# Patient Record
Sex: Male | Born: 1960 | Race: Black or African American | Hispanic: No | Marital: Single | Smoking: Current every day smoker
Health system: Southern US, Community
[De-identification: ages and names within clinical notes are randomized; demographics above are authoritative.]

## PROBLEM LIST (undated history)

## (undated) DIAGNOSIS — F1010 Alcohol abuse, uncomplicated: Secondary | ICD-10-CM

## (undated) DIAGNOSIS — I4891 Unspecified atrial fibrillation: Secondary | ICD-10-CM

## (undated) DIAGNOSIS — J449 Chronic obstructive pulmonary disease, unspecified: Secondary | ICD-10-CM

## (undated) DIAGNOSIS — I10 Essential (primary) hypertension: Secondary | ICD-10-CM

## (undated) DIAGNOSIS — Z923 Personal history of irradiation: Secondary | ICD-10-CM

## (undated) DIAGNOSIS — C20 Malignant neoplasm of rectum: Secondary | ICD-10-CM

## (undated) DIAGNOSIS — C801 Malignant (primary) neoplasm, unspecified: Secondary | ICD-10-CM

## (undated) DIAGNOSIS — C7800 Secondary malignant neoplasm of unspecified lung: Secondary | ICD-10-CM

## (undated) HISTORY — PX: MULTIPLE TOOTH EXTRACTIONS: SHX2053

---

## 1990-08-17 ENCOUNTER — Ambulatory Visit (HOSPITAL_COMMUNITY): Payer: Self-pay

## 2009-04-07 ENCOUNTER — Emergency Department (HOSPITAL_COMMUNITY): Admission: EM | Admit: 2009-04-07 | Discharge: 2009-04-07 | Payer: Self-pay | Admitting: Internal Medicine

## 2009-09-11 ENCOUNTER — Emergency Department (HOSPITAL_COMMUNITY)
Admission: EM | Admit: 2009-09-11 | Discharge: 2009-09-11 | Payer: Self-pay | Source: Home / Self Care | Admitting: Emergency Medicine

## 2009-09-17 ENCOUNTER — Ambulatory Visit: Payer: Self-pay | Admitting: Gastroenterology

## 2009-09-17 ENCOUNTER — Encounter: Payer: Self-pay | Admitting: Gastroenterology

## 2009-09-17 DIAGNOSIS — R197 Diarrhea, unspecified: Secondary | ICD-10-CM | POA: Insufficient documentation

## 2009-09-17 DIAGNOSIS — R198 Other specified symptoms and signs involving the digestive system and abdomen: Secondary | ICD-10-CM | POA: Insufficient documentation

## 2009-09-17 DIAGNOSIS — R933 Abnormal findings on diagnostic imaging of other parts of digestive tract: Secondary | ICD-10-CM | POA: Insufficient documentation

## 2009-09-17 DIAGNOSIS — R1032 Left lower quadrant pain: Secondary | ICD-10-CM | POA: Insufficient documentation

## 2009-09-17 DIAGNOSIS — K625 Hemorrhage of anus and rectum: Secondary | ICD-10-CM | POA: Insufficient documentation

## 2009-09-18 ENCOUNTER — Ambulatory Visit: Payer: Self-pay | Admitting: Oncology

## 2009-09-18 ENCOUNTER — Telehealth: Payer: Self-pay | Admitting: Gastroenterology

## 2009-09-18 ENCOUNTER — Ambulatory Visit: Payer: Self-pay | Admitting: Gastroenterology

## 2009-09-19 ENCOUNTER — Telehealth (INDEPENDENT_AMBULATORY_CARE_PROVIDER_SITE_OTHER): Payer: Self-pay | Admitting: *Deleted

## 2009-09-19 ENCOUNTER — Encounter (INDEPENDENT_AMBULATORY_CARE_PROVIDER_SITE_OTHER): Payer: Self-pay | Admitting: *Deleted

## 2009-09-19 DIAGNOSIS — C20 Malignant neoplasm of rectum: Secondary | ICD-10-CM

## 2009-09-19 HISTORY — DX: Malignant neoplasm of rectum: C20

## 2009-09-25 ENCOUNTER — Ambulatory Visit: Payer: Self-pay | Admitting: Gastroenterology

## 2009-09-25 ENCOUNTER — Ambulatory Visit (HOSPITAL_COMMUNITY)
Admission: RE | Admit: 2009-09-25 | Discharge: 2009-09-25 | Payer: Self-pay | Source: Home / Self Care | Admitting: Gastroenterology

## 2009-09-25 ENCOUNTER — Encounter (INDEPENDENT_AMBULATORY_CARE_PROVIDER_SITE_OTHER): Payer: Self-pay | Admitting: *Deleted

## 2009-09-26 ENCOUNTER — Ambulatory Visit: Admission: RE | Admit: 2009-09-26 | Discharge: 2009-11-19 | Payer: Self-pay | Admitting: Radiation Oncology

## 2009-10-02 ENCOUNTER — Encounter: Payer: Self-pay | Admitting: Gastroenterology

## 2009-10-03 ENCOUNTER — Ambulatory Visit (HOSPITAL_COMMUNITY)
Admission: RE | Admit: 2009-10-03 | Discharge: 2009-10-03 | Payer: Self-pay | Source: Home / Self Care | Admitting: Radiation Oncology

## 2009-10-09 LAB — CBC WITH DIFFERENTIAL/PLATELET
Eosinophils Absolute: 0 10*3/uL (ref 0.0–0.5)
MCH: 34.5 pg — ABNORMAL HIGH (ref 27.2–33.4)
MCHC: 34.5 g/dL (ref 32.0–36.0)
MCV: 100.1 fL — ABNORMAL HIGH (ref 79.3–98.0)
MONO#: 0.5 10*3/uL (ref 0.1–0.9)
MONO%: 6.1 % (ref 0.0–14.0)
NEUT%: 81.2 % — ABNORMAL HIGH (ref 39.0–75.0)
lymph#: 1 10*3/uL (ref 0.9–3.3)

## 2009-10-09 LAB — COMPREHENSIVE METABOLIC PANEL
ALT: 18 U/L (ref 0–53)
AST: 22 U/L (ref 0–37)
Albumin: 4.3 g/dL (ref 3.5–5.2)
Alkaline Phosphatase: 68 U/L (ref 39–117)
Calcium: 9.6 mg/dL (ref 8.4–10.5)
Chloride: 103 mEq/L (ref 96–112)
Creatinine, Ser: 0.99 mg/dL (ref 0.40–1.50)
Sodium: 140 mEq/L (ref 135–145)

## 2009-10-09 LAB — CEA: CEA: 34.7 ng/mL — ABNORMAL HIGH (ref 0.0–5.0)

## 2009-10-21 ENCOUNTER — Ambulatory Visit: Payer: Self-pay | Admitting: Oncology

## 2009-10-21 ENCOUNTER — Encounter: Payer: Self-pay | Admitting: Gastroenterology

## 2009-10-21 LAB — CBC WITH DIFFERENTIAL/PLATELET
Eosinophils Absolute: 0.1 10*3/uL (ref 0.0–0.5)
HCT: 42 % (ref 38.4–49.9)
MCH: 33 pg (ref 27.2–33.4)
MCHC: 33.1 g/dL (ref 32.0–36.0)
MCV: 99.8 fL — ABNORMAL HIGH (ref 79.3–98.0)
MONO#: 0.5 10*3/uL (ref 0.1–0.9)
MONO%: 10.5 % (ref 0.0–14.0)
NEUT#: 3.5 10*3/uL (ref 1.5–6.5)
RBC: 4.21 10*6/uL (ref 4.20–5.82)
RDW: 12.2 % (ref 11.0–14.6)
WBC: 4.8 10*3/uL (ref 4.0–10.3)
lymph#: 0.6 10*3/uL — ABNORMAL LOW (ref 0.9–3.3)

## 2009-10-28 LAB — COMPREHENSIVE METABOLIC PANEL WITH GFR
ALT: 16 U/L (ref 0–53)
AST: 16 U/L (ref 0–37)
Albumin: 4 g/dL (ref 3.5–5.2)
Alkaline Phosphatase: 57 U/L (ref 39–117)
BUN: 15 mg/dL (ref 6–23)
CO2: 26 meq/L (ref 19–32)
Calcium: 8.9 mg/dL (ref 8.4–10.5)
Chloride: 107 meq/L (ref 96–112)
Creatinine, Ser: 1.17 mg/dL (ref 0.40–1.50)
Glucose, Bld: 86 mg/dL (ref 70–99)
Potassium: 3.8 meq/L (ref 3.5–5.3)
Sodium: 141 meq/L (ref 135–145)
Total Bilirubin: 0.6 mg/dL (ref 0.3–1.2)
Total Protein: 6.7 g/dL (ref 6.0–8.3)

## 2009-10-28 LAB — CBC WITH DIFFERENTIAL/PLATELET
BASO%: 0.3 % (ref 0.0–2.0)
Basophils Absolute: 0 10e3/uL (ref 0.0–0.1)
EOS%: 1.1 % (ref 0.0–7.0)
Eosinophils Absolute: 0 10e3/uL (ref 0.0–0.5)
HCT: 37.6 % — ABNORMAL LOW (ref 38.4–49.9)
HGB: 13.2 g/dL (ref 13.0–17.1)
LYMPH%: 10.8 % — ABNORMAL LOW (ref 14.0–49.0)
MCH: 34.8 pg — ABNORMAL HIGH (ref 27.2–33.4)
MCHC: 35.1 g/dL (ref 32.0–36.0)
MCV: 99.1 fL — ABNORMAL HIGH (ref 79.3–98.0)
MONO#: 0.5 10e3/uL (ref 0.1–0.9)
MONO%: 12.9 % (ref 0.0–14.0)
NEUT#: 2.9 10e3/uL (ref 1.5–6.5)
NEUT%: 74.9 % (ref 39.0–75.0)
Platelets: 162 10e3/uL (ref 140–400)
RBC: 3.79 10e6/uL — ABNORMAL LOW (ref 4.20–5.82)
RDW: 12.5 % (ref 11.0–14.6)
WBC: 3.9 10e3/uL — ABNORMAL LOW (ref 4.0–10.3)
lymph#: 0.4 10e3/uL — ABNORMAL LOW (ref 0.9–3.3)

## 2009-11-06 ENCOUNTER — Emergency Department (HOSPITAL_COMMUNITY): Admission: EM | Admit: 2009-11-06 | Discharge: 2009-11-06 | Payer: Self-pay | Admitting: Emergency Medicine

## 2009-11-07 ENCOUNTER — Ambulatory Visit: Payer: Self-pay | Admitting: Dentistry

## 2009-11-07 ENCOUNTER — Encounter: Admission: AD | Admit: 2009-11-07 | Discharge: 2009-11-07 | Payer: Self-pay | Admitting: Dentistry

## 2009-11-10 LAB — CBC WITH DIFFERENTIAL/PLATELET
BASO%: 0.2 % (ref 0.0–2.0)
Basophils Absolute: 0 10*3/uL (ref 0.0–0.1)
Eosinophils Absolute: 0.1 10*3/uL (ref 0.0–0.5)
HCT: 37.3 % — ABNORMAL LOW (ref 38.4–49.9)
LYMPH%: 7.1 % — ABNORMAL LOW (ref 14.0–49.0)
MCV: 102.7 fL — ABNORMAL HIGH (ref 79.3–98.0)
MONO#: 0.6 10*3/uL (ref 0.1–0.9)
Platelets: 201 10*3/uL (ref 140–400)
RDW: 15.8 % — ABNORMAL HIGH (ref 11.0–14.6)

## 2009-11-11 ENCOUNTER — Encounter: Payer: Self-pay | Admitting: Gastroenterology

## 2009-11-13 ENCOUNTER — Ambulatory Visit (HOSPITAL_COMMUNITY): Admission: RE | Admit: 2009-11-13 | Discharge: 2009-11-13 | Payer: Self-pay | Admitting: Dentistry

## 2009-11-26 ENCOUNTER — Ambulatory Visit: Payer: Self-pay | Admitting: Oncology

## 2009-12-22 ENCOUNTER — Encounter: Payer: Self-pay | Admitting: Gastroenterology

## 2010-01-14 ENCOUNTER — Encounter (INDEPENDENT_AMBULATORY_CARE_PROVIDER_SITE_OTHER): Payer: Self-pay | Admitting: General Surgery

## 2010-01-14 ENCOUNTER — Other Ambulatory Visit (INDEPENDENT_AMBULATORY_CARE_PROVIDER_SITE_OTHER): Payer: Self-pay | Admitting: General Surgery

## 2010-01-14 ENCOUNTER — Inpatient Hospital Stay (HOSPITAL_COMMUNITY)
Admission: RE | Admit: 2010-01-14 | Discharge: 2010-01-21 | Payer: Self-pay | Source: Home / Self Care | Attending: General Surgery | Admitting: General Surgery

## 2010-01-15 HISTORY — PX: COLON SURGERY: SHX602

## 2010-01-26 ENCOUNTER — Inpatient Hospital Stay (HOSPITAL_COMMUNITY)
Admission: AD | Admit: 2010-01-26 | Discharge: 2010-02-04 | Payer: Self-pay | Source: Home / Self Care | Attending: General Surgery | Admitting: General Surgery

## 2010-01-28 ENCOUNTER — Encounter (INDEPENDENT_AMBULATORY_CARE_PROVIDER_SITE_OTHER): Payer: Self-pay | Admitting: General Surgery

## 2010-02-03 ENCOUNTER — Ambulatory Visit: Payer: Self-pay | Admitting: Oncology

## 2010-02-06 ENCOUNTER — Encounter: Payer: Self-pay | Admitting: Gastroenterology

## 2010-02-15 HISTORY — PX: COLON SURGERY: SHX602

## 2010-02-20 ENCOUNTER — Ambulatory Visit: Payer: Self-pay | Admitting: Oncology

## 2010-02-20 ENCOUNTER — Encounter: Payer: Self-pay | Admitting: Gastroenterology

## 2010-03-11 ENCOUNTER — Encounter: Payer: Self-pay | Admitting: Gastroenterology

## 2010-03-11 LAB — CBC WITH DIFFERENTIAL/PLATELET
BASO%: 0.3 % (ref 0.0–2.0)
Basophils Absolute: 0 10*3/uL (ref 0.0–0.1)
Eosinophils Absolute: 0.1 10*3/uL (ref 0.0–0.5)
HCT: 37.3 % — ABNORMAL LOW (ref 38.4–49.9)
HGB: 11.8 g/dL — ABNORMAL LOW (ref 13.0–17.1)
LYMPH%: 17.5 % (ref 14.0–49.0)
MCV: 94.7 fL (ref 79.3–98.0)
NEUT%: 70.7 % (ref 39.0–75.0)
RBC: 3.94 10*6/uL — ABNORMAL LOW (ref 4.20–5.82)
RDW: 17.2 % — ABNORMAL HIGH (ref 11.0–14.6)
lymph#: 1.1 10*3/uL (ref 0.9–3.3)
nRBC: 0 % (ref 0–0)

## 2010-03-11 LAB — COMPREHENSIVE METABOLIC PANEL
AST: 19 U/L (ref 0–37)
Albumin: 3.1 g/dL — ABNORMAL LOW (ref 3.5–5.2)
Alkaline Phosphatase: 81 U/L (ref 39–117)
Calcium: 9 mg/dL (ref 8.4–10.5)
Creatinine, Ser: 0.72 mg/dL (ref 0.40–1.50)
Potassium: 3.6 mEq/L (ref 3.5–5.3)
Sodium: 141 mEq/L (ref 135–145)
Total Bilirubin: 0.5 mg/dL (ref 0.3–1.2)

## 2010-03-17 NOTE — Letter (Signed)
Summary: Appt Reminder 2  [facility]  [address] [place], [place] [address]   Phone: [phone]  Fax: [phone]        September 25, 2009 MRN: [id number]    Jeffery [name] [address] [place], [place]  [address]    Dear [name],   You have an appointment with Dr.Sherrill on 10-02-09 at 11am. He is at the [facility] at [facility]. For any questions about this appointment please call me at [phone] or call Dr.Sherrill's office at [phone].      Sincerely,    Laureen Ochs LPN  Appended Document: Appt Reminder 2 Letter mailed to patient.

## 2010-03-17 NOTE — Progress Notes (Signed)
Summary: Onocology/Surgical Appt's  ---- Converted from flag ---- ---- 09/18/2009 3:54 PM, Louis Meckel MD wrote: He needs an appointment with Dr. [name] and Myrle Sheng.  Send report to each, ------------------------------  Phone Note Outgoing Call   Call placed by: Laureen Ochs LPN,  September 18, 2009 4:09 PM Summary of Call: I have faxed pt. records to the office of Dr.Sherrill and Dr.Byerly. I will contact pt. when I get his appt's scheduled. Initial call taken by: Laureen Ochs LPN,  September 18, 2009 4:10 PM  Follow-up for Phone Call        1)Per Renee at the [facility]: Pt. records are w/Dr.Sherrill for review, she will call with an appt. as soon as she has it scheduled.   2) Per Elane Fritz at [facility], pt's appt. w/Dr.Byerly is 10-09-09 at 2:10. Pt. has no insurance so he will need to bring $273 dollars w/him.  But, if his path. comes back as malignant, callback to get appt. moved up and discuss waiving fee so pt. can be seen. I will wait for pathology report and re-address on 09-22-09. Follow-up by: Laureen Ochs LPN,  September 19, 2009 2:59 PM  Additional Follow-up for Phone Call Additional follow up Details #1::        1)Path. report faxed to Renee at the [facility] and I left her a message to please call me w/pt's appt with Dr.Sherrill.   2) I have faxed the path. report to [facility]. Misty Stanley has rescheduled appt. to 09-23-09 at 11:40am. They will work w/pt. on his payments.  Message left w/pt's mother to have him call me ASAP. Additional Follow-up by: Laureen Ochs LPN,  September 22, 2009 4:01 PM    Additional Follow-up for Phone Call Additional follow up Details #2::    Mr.Tinnel has been given appt. information for tomorrow and he is aware I will call him when I get his appt. info. for Dr.Sherrill. Pt. instructed to call back as needed. Laureen Ochs LPN  September 22, 2009 4:33 PM   Per Luster Landsberg at the cancer center, pt. has an appt. w/Dr.Sherrill on 09-24-09 at 10am. Message left for patient  to callback. Laureen Ochs LPN  September 23, 2009 9:24 AM   I spoke w/pt's mother late yesterday afternoon, I asked her to please have pt. call right away. Now I am leaving a message  with pt's appt. information, I have asked for the pt. to please call and confirm. Follow-up by: Laureen Ochs LPN,  September 24, 2009 8:07 AM  Additional Follow-up for Phone Call Additional follow up Details #3:: Details for Additional Follow-up Action Taken: Pt. didn't go to appt. w/Dr.Sherrill. I have asked Renee to please reschedule the appt. I have left another message for pt. to please call me. Laureen Ochs LPN  September 24, 2009 12:04 PM  Message left for patient to callback. Laureen Ochs LPN  September 24, 2009 3:04 PM   Pt. is scheduled for an EUS this morning at [facility]. dept., I have advised Renee w/Dr.Sherrill, she will take his new appt. information and give it to the pt./designated driver. She will call and let me know the pt. has recieved his appt. info. Laureen Ochs LPN  September 25, 2009 9:04 AM  Pt's driver was given appt. information for pt. to see Dr.Sherrill on 10-02-09 at 11am. I will also mail him a reminder today. Additional Follow-up by: Laureen Ochs LPN,  September 25, 2009 10:32 AM

## 2010-03-17 NOTE — Letter (Signed)
Summary: [facility] Instructions  [facility]  [address] [place], [place] [address]   Phone: [phone]  Fax: [phone]       Jeffery [name]    [birth year]    MRN: [id number]        Procedure Day /Date:09-18-09     Arrival Time: 2:30 PM      Procedure Time: 3:30 PM     Location of Procedure:                    X     [facility] (4th Floor) PREPARATION FOR COLONOSCOPY WITH MOVIPREP    THE DAY BEFORE YOUR PROCEDURE         DATE: 09-17-09  DAY: Wednesday  1.  Drink clear liquids the entire day-NO SOLID FOOD  2.  Do not drink anything colored red or purple.  Avoid juices with pulp.  No orange juice.  3.  Drink at least 64 oz. (8 glasses) of fluid/clear liquids during the day to prevent dehydration and help the prep work efficiently.  CLEAR LIQUIDS INCLUDE: Water Jello Ice Popsicles Tea (sugar ok, no milk/cream) Powdered fruit flavored drinks Coffee (sugar ok, no milk/cream) Gatorade Juice: apple, white grape, white cranberry  Lemonade Clear bullion, consomm, broth Carbonated beverages (any kind) Strained chicken noodle soup Hard Candy                             4.  In the morning, mix first dose of MoviPrep solution:    Empty 1 Pouch A and 1 Pouch B into the disposable container    Add lukewarm drinking water to the top line of the container. Mix to dissolve    Refrigerate (mixed solution should be used within 24 hrs)  5.  Begin drinking the prep at 5:00 p.m. The MoviPrep container is divided by 4 marks.   Every 15 minutes drink the solution down to the next mark (approximately 8 oz) until the full liter is complete.   6.  Follow completed prep with 16 oz of clear liquid of your choice (Nothing red or purple).  Continue to drink clear liquids until bedtime.  7.  Before going to bed, mix second dose of MoviPrep solution:    Empty 1 Pouch A and 1 Pouch B into the disposable container    Add lukewarm drinking water to the top line of  the container. Mix to dissolve    Refrigerate  THE DAY OF YOUR PROCEDURE      DATE :09-18-09 DGL:OVFIEPPI  Beginning at 10:30 AM  (5 hours before procedure):         1. Every 15 minutes, drink the solution down to the next mark (approx 8 oz) until the full liter is complete.  2. Follow completed prep with 16 oz. of clear liquid of your choice.    3. You may drink clear liquids until 1:30 PM  (2 HOURS BEFORE PROCEDURE).   MEDICATION INSTRUCTIONS  Unless otherwise instructed, you should take regular prescription medications with a small sip of water   as early as possible the morning of your procedure.         OTHER INSTRUCTIONS  You will need a responsible adult at least 51 years of age to accompany you and drive you home.   This person must remain in the waiting room during your procedure.  Wear loose fitting clothing that is easily removed.  Leave  jewelry and other valuables at home.  However, you may wish to bring a book to read or  an iPod/MP3 player to listen to music as you wait for your procedure to start.  Remove all body piercing jewelry and leave at home.  Total time from sign-in until discharge is approximately 2-3 hours.  You should go home directly after your procedure and rest.  You can resume normal activities the  day after your procedure.  The day of your procedure you should not:   Drive   Make legal decisions   Operate machinery   Drink alcohol   Return to work  You will receive specific instructions about eating, activities and medications before you leave.    The above instructions have been reviewed and explained to me by   _______________________    I fully understand and can verbalize these instructions _____________________________ Date _________

## 2010-03-17 NOTE — Procedures (Signed)
Summary: Colonoscopy  Patient: Jeffery [name] Note: All result statuses are Final unless otherwise noted.  Tests: (1) Colonoscopy (COL)   COL Colonoscopy           DONE      [facility]     [address]     [place], [place]  [address]           COLONOSCOPY PROCEDURE REPORT           PATIENT:  Jeffery [name], Jeffery [name]  MR#:  [id number]     BIRTHDATE:  07/29/1960, 49 yrs. old  GENDER:  male           ENDOSCOPIST:  Barbette Hair. Arlyce Dice, MD     Referred by:           PROCEDURE DATE:  09/18/2009     PROCEDURE:  Colonoscopy with biopsy     ASA CLASS:  Class I     INDICATIONS:           MEDICATIONS:   Fentanyl 75 mcg IV, Versed 7 mg IV           DESCRIPTION OF PROCEDURE:   After the risks benefits and     alternatives of the procedure were thoroughly explained, informed     consent was obtained.  Digital rectal exam was performed and     revealed rectal mass.  Mass palpated within 4cm of anal verge The     [id number] endoscope was introduced through the anus and     advanced to the cecum, which was identified by both the appendix     and ileocecal valve, without limitations.  The quality of the prep     was good, using MoviPrep.  The instrument was then slowly     withdrawn as the colon was fully examined.     <<PROCEDUREIMAGES>>           FINDINGS:  A mass was found in the rectum. Circumferential,     bleeding exophytic mass with greater than 2cm deep ulcer beginning     at 3-4cm from anal verge and extending approximately 4cm. Multiple     biopsies were taken (see image8 and image9).  This was otherwise a     normal examination of the colon (see image1, image2, image3,     image4, image5, and image7).   Retroflexed views in the rectum     revealed Unable to retroflex.    The time to cecum =  4.25     minutes. The scope was then withdrawn (time =  7.0  min) from the     patient and the procedure completed.           COMPLICATIONS:  None           ENDOSCOPIC IMPRESSION:     1)  Malignant mass in the rectum     2) Otherwise normal examination           RECOMMENDATIONS:     1) EUS     2) Surgery     3) pt will be presented to tumor conference for consideration of     preoperative chemo/radiation therapy           REPEAT EXAM:  In 1 year(s) for Colonoscopy.           ______________________________     Barbette Hair. Arlyce Dice, MD           cc: Dr Almond Lint  n.     eSIGNED:   Barbette Hair. Kaplan at 09/18/2009 03:48 PM           Amada Kingfisher, [id number]  Note: An exclamation mark (!) indicates a result that was not dispersed into the flowsheet. Document Creation Date: 09/18/2009 3:50 PM _______________________________________________________________________  (1) Order result status: Final Collection or observation date-time: 09/18/2009 15:38 Requested date-time:  Receipt date-time:  Reported date-time:  Referring Physician:   Ordering Physician: Melvia Heaps [phone]) Specimen Source:  Source: Launa Grill Order Number: [id number] Lab site:   Appended Document: Colonoscopy Reminder Colonoscopy for 1 year 09/2010

## 2010-03-17 NOTE — Letter (Signed)
Summary: [facility]  [facility]   Imported By: Sherian Rein 01/05/2010 14:32:31  _____________________________________________________________________  External Attachment:    Type:   Image     Comment:   External Document

## 2010-03-17 NOTE — Letter (Signed)
Summary: [facility]  [address] [place], [place] [address]   Phone: [phone]  Fax: [phone]       [name]    07-08-1960    MRN: [id number]        Procedure Day /Date:09/25/09 Jeffery [name]     Arrival Time:930 am      Procedure Time:1030 am     Location of Procedure:                     X  [facility] ( Outpatient Registration)      PREPARATION FOR FLEXIBLE SIGMOIDOSCOPY WITH MAGNESIUM CITRATE  Prior to the day before your procedure, purchase one 8 oz. bottle of Magnesium Citrate and one Fleet Enema from the laxative section of your drugstore.  _________________________________________________________________________________________________  THE DAY BEFORE YOUR PROCEDURE             DATE: 09/24/09 DAY: WED  1.   Have a clear liquid dinner the night before your procedure.  2.   Do not drink anything colored red or purple.  Avoid juices with pulp.  No orange juice.              CLEAR LIQUIDS INCLUDE: Water Jello Ice Popsicles Tea (sugar ok, no milk/cream) Powdered fruit flavored drinks Coffee (sugar ok, no milk/cream) Gatorade Juice: apple, white grape, white cranberry  Lemonade Clear bullion, consomm, broth Carbonated beverages (any kind) Strained chicken noodle soup Hard Candy   3.   At 7:00 pm the night before your procedure, drink one bottle of Magnesium Citrate over ice.  4.   Drink at least 3 more glasses of clear liquids before bedtime (preferably juices).  5.   Results are expected usually within 1 to 6 hours after taking the Magnesium Citrate.  ___________________________________________________________________________________________________  THE DAY OF YOUR PROCEDURE            DATE: 09/25/09 [id number]  1.   Use Fleet Enema one hour prior to coming for procedure.  2.   You may drink clear liquids until 630 am (4 hours before exam)       MEDICATION INSTRUCTIONS  Unless otherwise instructed,  you should take regular prescription medications with a small sip of water as early as possible the morning of your procedure.        OTHER INSTRUCTIONS  You will need a responsible adult at least 51 years of age to accompany you and drive you home.   This person must remain in the waiting room during your procedure.  Wear loose fitting clothing that is easily removed.  Leave jewelry and other valuables at home.  However, you may wish to bring a book to read or an iPod/MP3 player to listen to music as you wait for your procedure to start.  Remove all body piercing jewelry and leave at home.  Total time from sign-in until discharge is approximately 2-3 hours.  You should go home directly after your procedure and rest.  You can resume normal activities the day after your procedure.  The day of your procedure you should not:   Drive   Make legal decisions   Operate machinery   Drink alcohol   Return to work  You will receive specific instructions about eating, activities and medications before you leave.   The above instructions have been reviewed and explained to me by   Chales Abrahams CMA (AAMA)  September 19, 2009 3:15 PM  I fully understand and can verbalize these instructions over the phone mailed to home Date 09/19/09

## 2010-03-17 NOTE — Letter (Signed)
Summary: [facility]  [facility]   Imported By: Sherian Rein 10/27/2009 13:46:29  _____________________________________________________________________  External Attachment:    Type:   Image     Comment:   External Document

## 2010-03-17 NOTE — Procedures (Signed)
Summary: Endoscopic Ultrasound  Patient: Jeffery [name] Note: All result statuses are Final unless otherwise noted.  Tests: (1) Endoscopic Ultrasound (EUS)  EUS Endoscopic Ultrasound                             DONE     [facility]     [address] [place], [place]  [address]           ENDOSCOPIC ULTRASOUND PROCEDURE REPORT           PATIENT:  Jeffery [name], Jeffery [name]  MR#:  [id number]     BIRTHDATE:  04-22-1960  GENDER:  male     ENDOSCOPIST:  Rachael Fee, MD     PROCEDURE DATE:  09/25/2009     PROCEDURE:  Lower EUS     ASA CLASS:  Class II     INDICATIONS:  newly diagnosed rectal adenocarcinoma     MEDICATIONS:  Fentanyl 75 mcg IV, Versed 7 mg IV           DESCRIPTION OF PROCEDURE:   After the risks benefits and     alternatives of the procedure were  explained, informed consent     was obtained. The patient was then placed in the left, lateral,     decubitus postion and IV sedation was administered. Throughout the     procedure, the patient's blood pressure, pulse and oxygen     saturations were monitored continuously.  Under direct     visualization, the Pentax EUS Radial [id number]     endoscope was introduced through the anus and advanced to the     proximal rectum.  Water was used as necessary to provide an     acoustic interface.  Upon completion of the imaging, water was     removed and the patient was sent to the recovery room in     satisfactory condition.           <<PROCEDUREIMAGES>>           Sigmoidoscopic findings:     1. Circumferential, ulcerated, fungating, friable mass. Distal     edge of the mass is 6cm from anal verge and the mass is about     4-5cm long, cranial/caudal.           EUS findings:     1. The mass above corresponds with a hypoechoic, irregularly     bordred, heterogeneous mass that clearly passes into and through     the muscularis propria layer of the rectal wall (uT3). The mass is     17mm thick, maximally.     2. There were at  least 3 small, round, well circumsribed     perirectal lymphnodes that were all suspicious for malignant     involvement (uN1). The largest was 7mm.           Impression:     Circumferential uT3N1 rectal adenocarcinoma that is 4-5cm long     (cranial/caudal), 1.7cm thick maximally and the distal edge is 6cm     from anal verge. He will likely benefit from neoadjuvant     chemo/xrt.           ______________________________     Rachael Fee, MD           cc: Melvia Heaps, MD; Almond Lint, MD; Mancel Bale, MD           n.  eSIGNED:   Rachael Fee at 09/25/2009 11:31 AM           Amada Kingfisher, [id number]  Note: An exclamation mark (!) indicates a result that was not dispersed into the flowsheet. Document Creation Date: 09/25/2009 11:32 AM _______________________________________________________________________  (1) Order result status: Final Collection or observation date-time: 09/25/2009 11:20 Requested date-time:  Receipt date-time:  Reported date-time:  Referring Physician:   Ordering Physician: Rob Bunting [id number]) Specimen Source:  Source: Launa Grill Order Number: [id number] Lab site:

## 2010-03-17 NOTE — Letter (Signed)
Summary: [facility]  [facility]   Imported By: Lester Wixom 11/12/2009 11:07:19  _____________________________________________________________________  External Attachment:    Type:   Image     Comment:   External Document

## 2010-03-17 NOTE — Letter (Signed)
Summary: [facility]  [facility]   Imported By: Lennie Odor 12/02/2009 16:23:01  _____________________________________________________________________  External Attachment:    Type:   Image     Comment:   External Document

## 2010-03-17 NOTE — Progress Notes (Signed)
Summary: EUS  ---- Converted from flag ---- ---- 09/19/2009 7:24 AM, Rachael Fee MD wrote: Jeffery [name], Just saw that he already had a CT scan last week.  I will send this to patty to set up lower EUS, radial , dx rectal cancer, for next thursday the 11th ------------------------------  Appended Document: EUS left message on machine to call back regarding EUS instructions, date and time  Chales Abrahams CMA (AAMA)  September 22, 2009 8:25 AM  left message on machine to call back   pt aware meds reviewed and pt instructed    Clinical Lists Changes  Problems: Added new problem of ADENOCARCINOMA, RECTUM (ICD-154.1) - Signed Orders: Added new Test order of EUS-Lower (EUS-Lower) - Signed

## 2010-03-17 NOTE — Assessment & Plan Note (Signed)
Summary: post. er.f/u-rlq pain/rectal bleeding   History of Present Illness Visit Type: consult Primary Provider: n/a Requesting Provider: Dr. Gwyneth Sprout Chief Complaint: Post hospital with complications of abdominal pain and rectal bleeding, Pt states he continue to have these problems, also states he can not sleep at night History of Present Illness:   Jeffery [name] Jeffery [name] REFERRED FROM THE ER. HE HAS HAD ABOUT 8 MONTHS OF PERSISTENT BRB MIXED WITH BM'S. HE REPORTS NUMEROUS (20-30) SMALL VOLUME BM'S PER DAY. OFTEN WITH JUST URGENCY AND PASSAGE OF A LITTLE BLOOD AND MUCOUS. HE DENIES ANY RECTAL PAIN. HE HAS HAD LOWER SUPRAPUBIC ABDOMINAL PAIN OVER THE PAST FEW MONTHS. HE IS EATING LESS BECAUSE IT INCREASES BM'S AND HAS LOST ABOUT 10 POUNDS. NO FEVERS.NO NAUSEA/VOMITING. NO PRIOR GI HX. CT SCAN  09/11/09 SHOWED INCREASED STOOL IN THE COLON AND IRREGULARITY AND WALL THICKENING OF THE RECTUM. LABS 09/11/09-HGB14.5/HCT42.3, WBC 7.4, CMET UNREMARKABLE.   GI Review of Systems    Reports abdominal pain, loss of appetite, and  weight loss.     Location of  Abdominal pain: lower abdomen. Weight loss of 10 pounds over 8 MONTHS.   Denies acid reflux, belching, bloating, chest pain, dysphagia with liquids, dysphagia with solids, heartburn, nausea, vomiting, vomiting blood, and  weight gain.      Reports change in bowel habits, diarrhea, and  rectal bleeding.     Denies anal fissure, black tarry stools, diverticulosis, fecal incontinence, heme positive stool, hemorrhoids, irritable bowel syndrome, jaundice, light color stool, liver problems, and  rectal pain. Preventive Screening-Counseling & Management  Alcohol-Tobacco     Smoking Status: current      Drug Use:  no.     Current Medications (verified): 1)  Bentyl 10 Mg Caps (Dicyclomine Hcl) .... Take 1 Tab Up To 3 Times Daily For Cramping and Stomach Spasms  Allergies (verified): No Known Drug Allergies  Past History:  Past Medical  History: NONE  Past Surgical History: Unremarkable  Family History: No FH of Colon Cancer: Mother ? cancer  Social History: Occupation: self employed Patient currently smokes.  1 PPD Alcohol Use - yes   2 beers every day Daily Caffeine Use  coffee and sodas Illicit Drug Use - no Smoking Status:  current Drug Use:  no  Review of Systems  The patient denies allergy/sinus, anemia, anxiety-new, arthritis/joint pain, back pain, blood in urine, breast changes/lumps, change in vision, confusion, cough, coughing up blood, depression-new, fainting, fatigue, fever, headaches-new, hearing problems, heart murmur, heart rhythm changes, itching, muscle pains/cramps, night sweats, nosebleeds, shortness of breath, skin rash, sleeping problems, sore throat, swelling of feet/legs, swollen lymph glands, thirst - excessive, urination - excessive, urination changes/pain, urine leakage, vision changes, and voice change.         SEE HPI  Vital Signs:  Patient profile:   50 year old [name] Height:      65 inches Weight:      145 pounds BMI:     24.22 BSA:     1.73 Pulse rate:   100 / minute Pulse rhythm:   regular BP sitting:   120 / 80  (left arm)  Vitals Entered By: Merri Ray CMA Duncan Dull) (September 17, 2009 8:58 AM)  Physical Exam  General:  Well developed, well nourished, no acute distress. Head:  Normocephalic and atraumatic. Eyes:  PERRLA, no icterus. Lungs:  Clear throughout to auscultation. Heart:  Regular rate and rhythm; no murmurs, rubs,  or bruits. Abdomen:  SOFT, TENDER LOWER ABDOMEN/SUPRAPUBIC  AREA, NO GUARDING, NO MASS ,NO RENOUND,BS+ Rectal:  NOT DONE,DOCUMENTED HEME POSITIVE PER THE ER. Neurologic:  Alert and  oriented x4;  grossly normal neurologically. Psych:  Alert and cooperative. Normal mood and affect.  Impression & Recommendations:  Problem # 1:  RECTAL BLEEDING (ICD-569.3) Assessment New 50 Y.O [name] WITH 8 MONTH HX OF FREQUENT SMALL VOLUME STOOLS WITH  HEMATOCHEZIA, MUCOUS, TENESMUS, LOWER ABDOMINAL PAIN AND ABNORMAL CT SCAN SHOWING RECTAL WALL THICKENING AND IRREGULARITY. R/O PROCTITIS/COLITIS,  R/O RECTAL NEOPLASM. SCHEDULED FOR COLONOSCOPY WITH DR. KAPLAN. PROCEDURE DISCUSSED IN DETAIL WITH PT, INCLUDING RISKS, BENEFITS AND ALTERNATIVES. TRIAL OF BENTYL 10 MG  3 X DAILY. FURTHER PLANS PENDING COLONOSCOPY FINDINGS. Orders: Colonoscopy (Colon)  Patient Instructions: 1)  We have scheduled the Colonoscopy with Dr. Arlyce Dice for tomorrow 09-18-09. 2)  Directions and brochure provided. 3)  [facility] Patient Information Guide given to patient. 4)  We have given you a sample Moviprep for the Colonoscopy. This is what you will be drinking to prepare for the Colonoscopy. 5)  We sent a prescription to [facility] for Bentyl for cramping and spasms of the stomach. 6)  The medication list was reviewed and reconciled.  All changed / newly prescribed medications were explained.  A complete medication list was provided to the patient / caregiver.  Prescriptions: BENTYL 10 MG CAPS (DICYCLOMINE HCL) Take 1 tab up to 3 times daily for cramping and stomach spasms  #90 x 0   Entered by:   Lowry Ram NCMA   Authorized by:   Sammuel Cooper PA-c   Signed by:   Lowry Ram NCMA on 09/17/2009   Method used:   Electronically to        [facility] [address] [phone]* (retail)       [address]       [place], [place]  [address]       Ph: [phone]       Fax: [phone]   RxID:   [id number]

## 2010-03-19 NOTE — Letter (Signed)
Summary: [facility]  [facility]   Imported By: Sherian Rein 03/13/2010 08:55:06  _____________________________________________________________________  External Attachment:    Type:   Image     Comment:   External Document

## 2010-03-19 NOTE — Letter (Signed)
Summary: [facility]  [facility]   Imported By: Sherian Rein 03/03/2010 14:23:43  _____________________________________________________________________  External Attachment:    Type:   Image     Comment:   External Document

## 2010-04-02 ENCOUNTER — Other Ambulatory Visit: Payer: Self-pay | Admitting: Oncology

## 2010-04-02 ENCOUNTER — Encounter (HOSPITAL_BASED_OUTPATIENT_CLINIC_OR_DEPARTMENT_OTHER): Payer: Self-pay | Admitting: Oncology

## 2010-04-02 DIAGNOSIS — M545 Low back pain, unspecified: Secondary | ICD-10-CM

## 2010-04-02 DIAGNOSIS — C20 Malignant neoplasm of rectum: Secondary | ICD-10-CM

## 2010-04-02 DIAGNOSIS — Z5111 Encounter for antineoplastic chemotherapy: Secondary | ICD-10-CM

## 2010-04-02 LAB — CBC WITH DIFFERENTIAL/PLATELET
BASO%: 0.2 % (ref 0.0–2.0)
Basophils Absolute: 0 10*3/uL (ref 0.0–0.1)
EOS%: 1.1 % (ref 0.0–7.0)
HCT: 37.6 % — ABNORMAL LOW (ref 38.4–49.9)
HGB: 12.4 g/dL — ABNORMAL LOW (ref 13.0–17.1)
MCH: 31.2 pg (ref 27.2–33.4)
NEUT#: 4.1 10*3/uL (ref 1.5–6.5)
NEUT%: 73.3 % (ref 39.0–75.0)
RDW: 19.7 % — ABNORMAL HIGH (ref 11.0–14.6)
WBC: 5.6 10*3/uL (ref 4.0–10.3)
nRBC: 0 % (ref 0–0)

## 2010-04-02 LAB — COMPREHENSIVE METABOLIC PANEL
AST: 20 U/L (ref 0–37)
Alkaline Phosphatase: 59 U/L (ref 39–117)
BUN: 7 mg/dL (ref 6–23)
Potassium: 3.7 mEq/L (ref 3.5–5.3)
Sodium: 140 mEq/L (ref 135–145)

## 2010-04-23 ENCOUNTER — Other Ambulatory Visit: Payer: Self-pay | Admitting: Oncology

## 2010-04-23 ENCOUNTER — Encounter (HOSPITAL_BASED_OUTPATIENT_CLINIC_OR_DEPARTMENT_OTHER): Payer: Medicaid Other | Admitting: Oncology

## 2010-04-23 DIAGNOSIS — E279 Disorder of adrenal gland, unspecified: Secondary | ICD-10-CM

## 2010-04-23 DIAGNOSIS — C20 Malignant neoplasm of rectum: Secondary | ICD-10-CM

## 2010-04-23 DIAGNOSIS — M545 Low back pain: Secondary | ICD-10-CM

## 2010-04-23 DIAGNOSIS — Z5111 Encounter for antineoplastic chemotherapy: Secondary | ICD-10-CM

## 2010-04-23 LAB — CBC WITH DIFFERENTIAL/PLATELET
BASO%: 0 % (ref 0.0–2.0)
EOS%: 0.6 % (ref 0.0–7.0)
HCT: 40 % (ref 38.4–49.9)
LYMPH%: 11 % — ABNORMAL LOW (ref 14.0–49.0)
MCV: 95.8 fL (ref 79.3–98.0)
MONO#: 0.6 10*3/uL (ref 0.1–0.9)
MONO%: 11 % (ref 0.0–14.0)
WBC: 5.8 10*3/uL (ref 4.0–10.3)
lymph#: 0.6 10*3/uL — ABNORMAL LOW (ref 0.9–3.3)

## 2010-04-23 LAB — COMPREHENSIVE METABOLIC PANEL
Chloride: 105 mEq/L (ref 96–112)
Potassium: 3.5 mEq/L (ref 3.5–5.3)

## 2010-04-23 NOTE — Letter (Signed)
Summary:  [facility]  [facility]   Imported By: Sherian Rein 04/14/2010 07:24:30  _____________________________________________________________________  External Attachment:    Type:   Image     Comment:   External Document

## 2010-04-27 LAB — BASIC METABOLIC PANEL
BUN: 14 mg/dL (ref 6–23)
BUN: 25 mg/dL — ABNORMAL HIGH (ref 6–23)
CO2: 29 mEq/L (ref 19–32)
CO2: 34 mEq/L — ABNORMAL HIGH (ref 19–32)
CO2: 34 mEq/L — ABNORMAL HIGH (ref 19–32)
CO2: 39 mEq/L — ABNORMAL HIGH (ref 19–32)
Calcium: 7.8 mg/dL — ABNORMAL LOW (ref 8.4–10.5)
Calcium: 7.9 mg/dL — ABNORMAL LOW (ref 8.4–10.5)
Calcium: 7.9 mg/dL — ABNORMAL LOW (ref 8.4–10.5)
Calcium: 8 mg/dL — ABNORMAL LOW (ref 8.4–10.5)
Creatinine, Ser: 0.7 mg/dL (ref 0.4–1.5)
Creatinine, Ser: 0.81 mg/dL (ref 0.4–1.5)
Creatinine, Ser: 0.82 mg/dL (ref 0.4–1.5)
GFR calc Af Amer: >60 mL/min (ref >60)
GFR calc Af Amer: >60 mL/min (ref >60)
GFR calc Af Amer: >60 mL/min (ref >60)
GFR calc non Af Amer: >60 mL/min (ref >60)
Glucose, Bld: 139 mg/dL — ABNORMAL HIGH (ref 70–99)
Glucose, Bld: 152 mg/dL — ABNORMAL HIGH (ref 70–99)
Glucose, Bld: 164 mg/dL — ABNORMAL HIGH (ref 70–99)
Glucose, Bld: 199 mg/dL — ABNORMAL HIGH (ref 70–99)
Potassium: 3.5 mEq/L (ref 3.5–5.1)
Potassium: 4 mEq/L (ref 3.5–5.1)
Sodium: 132 mEq/L — ABNORMAL LOW (ref 135–145)
Sodium: 137 mEq/L (ref 135–145)
Sodium: 139 mEq/L (ref 135–145)

## 2010-04-27 LAB — GLUCOSE, CAPILLARY
Glucose-Capillary: 125 mg/dL — ABNORMAL HIGH (ref 70–99)
Glucose-Capillary: 129 mg/dL — ABNORMAL HIGH (ref 70–99)
Glucose-Capillary: 134 mg/dL — ABNORMAL HIGH (ref 70–99)
Glucose-Capillary: 137 mg/dL — ABNORMAL HIGH (ref 70–99)
Glucose-Capillary: 143 mg/dL — ABNORMAL HIGH (ref 70–99)
Glucose-Capillary: 146 mg/dL — ABNORMAL HIGH (ref 70–99)
Glucose-Capillary: 149 mg/dL — ABNORMAL HIGH (ref 70–99)
Glucose-Capillary: 181 mg/dL — ABNORMAL HIGH (ref 70–99)
Glucose-Capillary: 207 mg/dL — ABNORMAL HIGH (ref 70–99)

## 2010-04-27 LAB — MAGNESIUM
Magnesium: 1.9 mg/dL (ref 1.5–2.5)
Magnesium: 2.2 mg/dL (ref 1.5–2.5)

## 2010-04-27 LAB — CBC
HCT: 29 % — ABNORMAL LOW (ref 39.0–52.0)
HCT: 35.9 % — ABNORMAL LOW (ref 39.0–52.0)
Hemoglobin: 7.8 g/dL — ABNORMAL LOW (ref 13.0–17.0)
Hemoglobin: 9.2 g/dL — ABNORMAL LOW (ref 13.0–17.0)
MCH: 32 pg (ref 26.0–34.0)
MCH: 32.2 pg (ref 26.0–34.0)
MCH: 32.3 pg (ref 26.0–34.0)
MCH: 32.6 pg (ref 26.0–34.0)
MCHC: 32.3 g/dL (ref 30.0–36.0)
MCHC: 32.5 g/dL (ref 30.0–36.0)
MCHC: 32.6 g/dL (ref 30.0–36.0)
MCHC: 33.1 g/dL (ref 30.0–36.0)
MCHC: 33.2 g/dL (ref 30.0–36.0)
MCV: 98.4 fL (ref 78.0–100.0)
Platelets: 327 10*3/uL (ref 150–400)
Platelets: 360 10*3/uL (ref 150–400)
RBC: 2.33 MIL/uL — ABNORMAL LOW (ref 4.22–5.81)
RBC: 2.89 MIL/uL — ABNORMAL LOW (ref 4.22–5.81)
RDW: 13 % (ref 11.5–15.5)
RDW: 13.1 % (ref 11.5–15.5)
WBC: 16.1 10*3/uL — ABNORMAL HIGH (ref 4.0–10.5)

## 2010-04-28 LAB — COMPREHENSIVE METABOLIC PANEL
ALT: 22 U/L (ref 0–53)
AST: 28 U/L (ref 0–37)
Albumin: 3.7 g/dL (ref 3.5–5.2)
Albumin: 3.8 g/dL (ref 3.5–5.2)
Alkaline Phosphatase: 66 U/L (ref 39–117)
Alkaline Phosphatase: 70 U/L (ref 39–117)
BUN: 72 mg/dL — ABNORMAL HIGH (ref 6–23)
BUN: 9 mg/dL (ref 6–23)
CO2: 31 mEq/L (ref 19–32)
Chloride: 70 mEq/L — ABNORMAL LOW (ref 96–112)
Chloride: 103 mEq/L (ref 96–112)
Creatinine, Ser: 0.94 mg/dL (ref 0.4–1.5)
GFR calc non Af Amer: >60 mL/min (ref >60)
Potassium: 3.3 mEq/L — ABNORMAL LOW (ref 3.5–5.1)
Potassium: 4.3 mEq/L (ref 3.5–5.1)
Sodium: 127 mEq/L — ABNORMAL LOW (ref 135–145)
Total Bilirubin: 1.3 mg/dL — ABNORMAL HIGH (ref 0.3–1.2)
Total Bilirubin: 0.6 mg/dL (ref 0.3–1.2)
Total Protein: 7.5 g/dL (ref 6.0–8.3)

## 2010-04-28 LAB — CBC
HCT: 36.7 % — ABNORMAL LOW (ref 39.0–52.0)
HCT: 39.2 % (ref 39.0–52.0)
HCT: 36.4 % — ABNORMAL LOW (ref 39.0–52.0)
HCT: 36.5 % — ABNORMAL LOW (ref 39.0–52.0)
HCT: 42.3 % (ref 39.0–52.0)
Hemoglobin: 12.4 g/dL — ABNORMAL LOW (ref 13.0–17.0)
Hemoglobin: 12.4 g/dL — ABNORMAL LOW (ref 13.0–17.0)
Hemoglobin: 12.7 g/dL — ABNORMAL LOW (ref 13.0–17.0)
Hemoglobin: 14.8 g/dL (ref 13.0–17.0)
MCH: 34 pg (ref 26.0–34.0)
MCH: 35 pg — ABNORMAL HIGH (ref 26.0–34.0)
MCH: 36.1 pg — ABNORMAL HIGH (ref 26.0–34.0)
MCHC: 34.7 g/dL (ref 30.0–36.0)
MCHC: 34.8 g/dL (ref 30.0–36.0)
MCHC: 35 g/dL (ref 30.0–36.0)
MCV: 94.9 fL (ref 78.0–100.0)
MCV: 97.3 fL (ref 78.0–100.0)
MCV: 100 fL (ref 78.0–100.0)
MCV: 97.9 fL (ref 78.0–100.0)
MCV: 99.7 fL (ref 78.0–100.0)
Platelets: 515 10*3/uL — ABNORMAL HIGH (ref 150–400)
Platelets: 219 10*3/uL (ref 150–400)
Platelets: 307 10*3/uL (ref 150–400)
RBC: 3.77 MIL/uL — ABNORMAL LOW (ref 4.22–5.81)
RBC: 4.13 MIL/uL — ABNORMAL LOW (ref 4.22–5.81)
RBC: 3.48 MIL/uL — ABNORMAL LOW (ref 4.22–5.81)
RBC: 4.1 MIL/uL — ABNORMAL LOW (ref 4.22–5.81)
RDW: 12 % (ref 11.5–15.5)
RDW: 11.5 % (ref 11.5–15.5)
RDW: 11.7 % (ref 11.5–15.5)
RDW: 11.9 % (ref 11.5–15.5)
WBC: 16.9 10*3/uL — ABNORMAL HIGH (ref 4.0–10.5)
WBC: 17.6 10*3/uL — ABNORMAL HIGH (ref 4.0–10.5)
WBC: 10.5 10*3/uL (ref 4.0–10.5)
WBC: 13.4 10*3/uL — ABNORMAL HIGH (ref 4.0–10.5)

## 2010-04-28 LAB — BASIC METABOLIC PANEL
BUN: 35 mg/dL — ABNORMAL HIGH (ref 6–23)
BUN: 5 mg/dL — ABNORMAL LOW (ref 6–23)
BUN: 8 mg/dL (ref 6–23)
BUN: 9 mg/dL (ref 6–23)
CO2: 28 mEq/L (ref 19–32)
CO2: 35 mEq/L — ABNORMAL HIGH (ref 19–32)
Calcium: 8.2 mg/dL — ABNORMAL LOW (ref 8.4–10.5)
Calcium: 8.6 mg/dL (ref 8.4–10.5)
Calcium: 8.8 mg/dL (ref 8.4–10.5)
Chloride: 82 mEq/L — ABNORMAL LOW (ref 96–112)
Chloride: 102 mEq/L (ref 96–112)
Creatinine, Ser: 0.83 mg/dL (ref 0.4–1.5)
Creatinine, Ser: 0.9 mg/dL (ref 0.4–1.5)
Creatinine, Ser: 0.9 mg/dL (ref 0.4–1.5)
GFR calc Af Amer: >60 mL/min (ref >60)
GFR calc Af Amer: >60 mL/min (ref >60)
GFR calc Af Amer: >60 mL/min (ref >60)
GFR calc non Af Amer: >60 mL/min (ref >60)
GFR calc non Af Amer: >60 mL/min (ref >60)
GFR calc non Af Amer: >60 mL/min (ref >60)
GFR calc non Af Amer: >60 mL/min (ref >60)
Glucose, Bld: 134 mg/dL — ABNORMAL HIGH (ref 70–99)
Glucose, Bld: 141 mg/dL — ABNORMAL HIGH (ref 70–99)
Glucose, Bld: 168 mg/dL — ABNORMAL HIGH (ref 70–99)
Potassium: 3.2 mEq/L — ABNORMAL LOW (ref 3.5–5.1)
Potassium: 3.9 mEq/L (ref 3.5–5.1)
Sodium: 133 mEq/L — ABNORMAL LOW (ref 135–145)

## 2010-04-28 LAB — DIFFERENTIAL
Basophils Absolute: 0 10*3/uL (ref 0.0–0.1)
Basophils Relative: 0 % (ref 0–1)
Basophils Relative: 0 % (ref 0–1)
Eosinophils Absolute: 0.1 10*3/uL (ref 0.0–0.7)
Eosinophils Relative: 1 % (ref 0–5)
Eosinophils Relative: 0 % (ref 0–5)
Lymphs Abs: 0.9 10*3/uL (ref 0.7–4.0)
Monocytes Absolute: 0.9 10*3/uL (ref 0.1–1.0)
Monocytes Absolute: 0.6 10*3/uL (ref 0.1–1.0)
Neutro Abs: 7.6 10*3/uL (ref 1.7–7.7)
Neutro Abs: 5.6 10*3/uL (ref 1.7–7.7)
Neutrophils Relative %: 81 % — ABNORMAL HIGH (ref 43–77)

## 2010-04-28 LAB — GLUCOSE, CAPILLARY
Glucose-Capillary: 114 mg/dL — ABNORMAL HIGH (ref 70–99)
Glucose-Capillary: 115 mg/dL — ABNORMAL HIGH (ref 70–99)
Glucose-Capillary: 127 mg/dL — ABNORMAL HIGH (ref 70–99)
Glucose-Capillary: 150 mg/dL — ABNORMAL HIGH (ref 70–99)
Glucose-Capillary: 188 mg/dL — ABNORMAL HIGH (ref 70–99)

## 2010-04-28 LAB — URINALYSIS, ROUTINE W REFLEX MICROSCOPIC
Glucose, UA: NEGATIVE mg/dL
Hgb urine dipstick: NEGATIVE
Nitrite: NEGATIVE
Specific Gravity, Urine: 1.021 (ref 1.005–1.030)
pH: 6 (ref 5.0–8.0)

## 2010-04-28 LAB — CEA: CEA: 3.9 ng/mL (ref 0.0–5.0)

## 2010-04-28 LAB — PHOSPHORUS
Phosphorus: 2.8 mg/dL (ref 2.3–4.6)
Phosphorus: 2.7 mg/dL (ref 2.3–4.6)

## 2010-04-28 LAB — PROTIME-INR: Prothrombin Time: 13.2 seconds (ref 11.6–15.2)

## 2010-04-28 LAB — MAGNESIUM
Magnesium: 1.8 mg/dL (ref 1.5–2.5)
Magnesium: 1.8 mg/dL (ref 1.5–2.5)

## 2010-04-28 LAB — ABO/RH: ABO/RH(D): B POS

## 2010-04-30 LAB — BASIC METABOLIC PANEL
CO2: 27 mEq/L (ref 19–32)
Chloride: 107 mEq/L (ref 96–112)
GFR calc Af Amer: >60 mL/min (ref >60)
Potassium: 3.6 mEq/L (ref 3.5–5.1)
Sodium: 140 mEq/L (ref 135–145)

## 2010-05-02 LAB — CBC
HCT: 42.3 % (ref 39.0–52.0)
Hemoglobin: 14.5 g/dL (ref 13.0–17.0)
MCV: 100.5 fL — ABNORMAL HIGH (ref 78.0–100.0)
Platelets: 216 10*3/uL (ref 150–400)
RBC: 4.21 MIL/uL — ABNORMAL LOW (ref 4.22–5.81)
WBC: 7.4 10*3/uL (ref 4.0–10.5)

## 2010-05-02 LAB — DIFFERENTIAL
Basophils Absolute: 0.1 10*3/uL (ref 0.0–0.1)
Basophils Relative: 1 % (ref 0–1)
Monocytes Absolute: 0.5 10*3/uL (ref 0.1–1.0)
Neutro Abs: 5.3 10*3/uL (ref 1.7–7.7)

## 2010-05-02 LAB — COMPREHENSIVE METABOLIC PANEL
Albumin: 3.7 g/dL (ref 3.5–5.2)
Alkaline Phosphatase: 60 U/L (ref 39–117)
BUN: 9 mg/dL (ref 6–23)
CO2: 25 mEq/L (ref 19–32)
Chloride: 106 mEq/L (ref 96–112)
GFR calc non Af Amer: >60 mL/min (ref >60)
Glucose, Bld: 93 mg/dL (ref 70–99)
Potassium: 3.9 mEq/L (ref 3.5–5.1)
Total Bilirubin: 0.8 mg/dL (ref 0.3–1.2)

## 2010-05-02 LAB — URINALYSIS, ROUTINE W REFLEX MICROSCOPIC
Bilirubin Urine: NEGATIVE
Ketones, ur: NEGATIVE mg/dL
Nitrite: NEGATIVE
Specific Gravity, Urine: 1.012 (ref 1.005–1.030)
Urobilinogen, UA: 2 mg/dL — ABNORMAL HIGH (ref 0.0–1.0)

## 2010-05-08 LAB — STOOL CULTURE

## 2010-05-08 LAB — GIARDIA/CRYPTOSPORIDIUM SCREEN(EIA)
Cryptosporidium Screen (EIA): NEGATIVE
Giardia Screen - EIA: NEGATIVE

## 2010-05-08 LAB — CLOSTRIDIUM DIFFICILE EIA

## 2010-05-08 LAB — POCT I-STAT, CHEM 8
Calcium, Ion: 1.05 mmol/L — ABNORMAL LOW (ref 1.12–1.32)
Glucose, Bld: 96 mg/dL (ref 70–99)
HCT: 45 % (ref 39.0–52.0)
Hemoglobin: 15.3 g/dL (ref 13.0–17.0)

## 2010-05-14 ENCOUNTER — Other Ambulatory Visit: Payer: Self-pay | Admitting: Oncology

## 2010-05-14 ENCOUNTER — Encounter (HOSPITAL_BASED_OUTPATIENT_CLINIC_OR_DEPARTMENT_OTHER): Payer: Medicaid Other | Admitting: Oncology

## 2010-05-14 DIAGNOSIS — E279 Disorder of adrenal gland, unspecified: Secondary | ICD-10-CM

## 2010-05-14 DIAGNOSIS — C20 Malignant neoplasm of rectum: Secondary | ICD-10-CM

## 2010-05-14 DIAGNOSIS — Z5111 Encounter for antineoplastic chemotherapy: Secondary | ICD-10-CM

## 2010-05-14 DIAGNOSIS — M545 Low back pain: Secondary | ICD-10-CM

## 2010-05-14 LAB — CBC WITH DIFFERENTIAL/PLATELET
BASO%: 0.2 % (ref 0.0–2.0)
EOS%: 0.6 % (ref 0.0–7.0)
MCH: 32.6 pg (ref 27.2–33.4)
MCHC: 34.1 g/dL (ref 32.0–36.0)
NEUT%: 73.2 % (ref 39.0–75.0)
RDW: 18.7 % — ABNORMAL HIGH (ref 11.0–14.6)
lymph#: 0.9 10*3/uL (ref 0.9–3.3)

## 2010-05-14 LAB — COMPREHENSIVE METABOLIC PANEL
AST: 25 U/L (ref 0–37)
Albumin: 3.5 g/dL (ref 3.5–5.2)
Alkaline Phosphatase: 67 U/L (ref 39–117)
BUN: 11 mg/dL (ref 6–23)
Potassium: 3.5 mEq/L (ref 3.5–5.3)
Sodium: 140 mEq/L (ref 135–145)

## 2010-06-04 ENCOUNTER — Encounter (HOSPITAL_BASED_OUTPATIENT_CLINIC_OR_DEPARTMENT_OTHER): Payer: Medicaid Other | Admitting: Oncology

## 2010-06-04 ENCOUNTER — Other Ambulatory Visit: Payer: Self-pay | Admitting: Oncology

## 2010-06-04 DIAGNOSIS — M545 Low back pain, unspecified: Secondary | ICD-10-CM

## 2010-06-04 DIAGNOSIS — Z5111 Encounter for antineoplastic chemotherapy: Secondary | ICD-10-CM

## 2010-06-04 DIAGNOSIS — C20 Malignant neoplasm of rectum: Secondary | ICD-10-CM

## 2010-06-04 DIAGNOSIS — E279 Disorder of adrenal gland, unspecified: Secondary | ICD-10-CM

## 2010-06-04 LAB — CBC WITH DIFFERENTIAL/PLATELET
Basophils Absolute: 0 10*3/uL (ref 0.0–0.1)
EOS%: 0.4 % (ref 0.0–7.0)
HGB: 12.8 g/dL — ABNORMAL LOW (ref 13.0–17.1)
LYMPH%: 15.8 % (ref 14.0–49.0)
MCH: 34 pg — ABNORMAL HIGH (ref 27.2–33.4)
MCV: 99.2 fL — ABNORMAL HIGH (ref 79.3–98.0)
MONO%: 12.5 % (ref 0.0–14.0)
NEUT%: 71.1 % (ref 39.0–75.0)
Platelets: 255 10*3/uL (ref 140–400)
RDW: 17.7 % — ABNORMAL HIGH (ref 11.0–14.6)

## 2010-06-04 LAB — COMPREHENSIVE METABOLIC PANEL
Alkaline Phosphatase: 59 U/L (ref 39–117)
BUN: 10 mg/dL (ref 6–23)
Creatinine, Ser: 0.98 mg/dL (ref 0.40–1.50)
Glucose, Bld: 81 mg/dL (ref 70–99)
Sodium: 138 mEq/L (ref 135–145)
Total Bilirubin: 0.8 mg/dL (ref 0.3–1.2)

## 2010-06-22 ENCOUNTER — Other Ambulatory Visit (HOSPITAL_COMMUNITY): Payer: Self-pay | Admitting: General Surgery

## 2010-06-22 DIAGNOSIS — C189 Malignant neoplasm of colon, unspecified: Secondary | ICD-10-CM

## 2010-06-25 ENCOUNTER — Other Ambulatory Visit: Payer: Self-pay | Admitting: Oncology

## 2010-06-25 ENCOUNTER — Other Ambulatory Visit (HOSPITAL_COMMUNITY): Payer: Self-pay | Admitting: General Surgery

## 2010-06-25 ENCOUNTER — Other Ambulatory Visit (HOSPITAL_COMMUNITY): Payer: Medicaid Other

## 2010-06-25 ENCOUNTER — Encounter (HOSPITAL_BASED_OUTPATIENT_CLINIC_OR_DEPARTMENT_OTHER): Payer: Medicaid Other | Admitting: Oncology

## 2010-06-25 DIAGNOSIS — E279 Disorder of adrenal gland, unspecified: Secondary | ICD-10-CM

## 2010-06-25 DIAGNOSIS — C20 Malignant neoplasm of rectum: Secondary | ICD-10-CM

## 2010-06-25 DIAGNOSIS — Z5111 Encounter for antineoplastic chemotherapy: Secondary | ICD-10-CM

## 2010-06-25 DIAGNOSIS — M545 Low back pain: Secondary | ICD-10-CM

## 2010-06-25 DIAGNOSIS — C189 Malignant neoplasm of colon, unspecified: Secondary | ICD-10-CM

## 2010-06-25 LAB — COMPREHENSIVE METABOLIC PANEL
ALT: 19 U/L (ref 0–53)
AST: 26 U/L (ref 0–37)
Albumin: 3.5 g/dL (ref 3.5–5.2)
BUN: 12 mg/dL (ref 6–23)
CO2: 24 mEq/L (ref 19–32)
Calcium: 8.9 mg/dL (ref 8.4–10.5)
Chloride: 104 mEq/L (ref 96–112)
Creatinine, Ser: 0.98 mg/dL (ref 0.40–1.50)
Potassium: 3.3 mEq/L — ABNORMAL LOW (ref 3.5–5.3)

## 2010-06-25 LAB — CBC WITH DIFFERENTIAL/PLATELET
BASO%: 0.2 % (ref 0.0–2.0)
Eosinophils Absolute: 0.1 10*3/uL (ref 0.0–0.5)
HCT: 36.5 % — ABNORMAL LOW (ref 38.4–49.9)
LYMPH%: 11.8 % — ABNORMAL LOW (ref 14.0–49.0)
MONO#: 0.6 10*3/uL (ref 0.1–0.9)
NEUT#: 4.1 10*3/uL (ref 1.5–6.5)
NEUT%: 75.7 % — ABNORMAL HIGH (ref 39.0–75.0)
Platelets: 218 10*3/uL (ref 140–400)
WBC: 5.4 10*3/uL (ref 4.0–10.3)
lymph#: 0.6 10*3/uL — ABNORMAL LOW (ref 0.9–3.3)
nRBC: 0 % (ref 0–0)

## 2010-06-29 ENCOUNTER — Other Ambulatory Visit (HOSPITAL_COMMUNITY): Payer: Self-pay | Admitting: General Surgery

## 2010-06-29 ENCOUNTER — Encounter (HOSPITAL_COMMUNITY)
Admission: RE | Admit: 2010-06-29 | Discharge: 2010-06-29 | Disposition: A | Discharge status: Home / Self Care | Payer: Medicaid Other | Source: Ambulatory Visit | Attending: General Surgery | Admitting: General Surgery

## 2010-06-29 ENCOUNTER — Ambulatory Visit (HOSPITAL_COMMUNITY)
Admission: RE | Admit: 2010-06-29 | Discharge: 2010-06-29 | Disposition: A | Discharge status: Home / Self Care | Payer: Medicaid Other | Source: Ambulatory Visit | Attending: General Surgery | Admitting: General Surgery

## 2010-06-29 ENCOUNTER — Other Ambulatory Visit (HOSPITAL_COMMUNITY): Payer: Medicaid Other

## 2010-06-29 DIAGNOSIS — K5730 Diverticulosis of large intestine without perforation or abscess without bleeding: Secondary | ICD-10-CM | POA: Insufficient documentation

## 2010-06-29 DIAGNOSIS — C189 Malignant neoplasm of colon, unspecified: Secondary | ICD-10-CM

## 2010-06-29 DIAGNOSIS — Z433 Encounter for attention to colostomy: Secondary | ICD-10-CM | POA: Insufficient documentation

## 2010-06-29 MED ORDER — IOHEXOL 300 MG/ML  SOLN
900.0000 mL | Freq: Once | INTRAMUSCULAR | Status: AC | PRN
  Administered 2010-06-29: 300 mL

## 2010-07-27 ENCOUNTER — Encounter (HOSPITAL_BASED_OUTPATIENT_CLINIC_OR_DEPARTMENT_OTHER): Payer: Medicaid Other | Admitting: Oncology

## 2010-07-27 ENCOUNTER — Other Ambulatory Visit: Payer: Self-pay | Admitting: Oncology

## 2010-07-27 DIAGNOSIS — Z5111 Encounter for antineoplastic chemotherapy: Secondary | ICD-10-CM

## 2010-07-27 DIAGNOSIS — E279 Disorder of adrenal gland, unspecified: Secondary | ICD-10-CM

## 2010-07-27 DIAGNOSIS — C20 Malignant neoplasm of rectum: Secondary | ICD-10-CM

## 2010-07-27 DIAGNOSIS — M545 Low back pain: Secondary | ICD-10-CM

## 2010-07-27 LAB — CBC WITH DIFFERENTIAL/PLATELET
Basophils Absolute: 0 10*3/uL (ref 0.0–0.1)
EOS%: 0.8 % (ref 0.0–7.0)
HCT: 40.5 % (ref 38.4–49.9)
HGB: 14.2 g/dL (ref 13.0–17.1)
MCH: 36.1 pg — ABNORMAL HIGH (ref 27.2–33.4)
MCV: 103.1 fL — ABNORMAL HIGH (ref 79.3–98.0)
MONO%: 11 % (ref 0.0–14.0)
NEUT%: 73.1 % (ref 39.0–75.0)

## 2010-09-01 ENCOUNTER — Telehealth (INDEPENDENT_AMBULATORY_CARE_PROVIDER_SITE_OTHER): Payer: Self-pay

## 2010-09-01 NOTE — Telephone Encounter (Signed)
Called pt's work number - unable to reach him - invalid.  Called his home number and his elderly mother answered.  Was not able to leave a msg with her.  Pt's orders have gone to surgery scheduling.

## 2010-09-16 ENCOUNTER — Encounter (HOSPITAL_COMMUNITY)
Admission: RE | Admit: 2010-09-16 | Discharge: 2010-09-16 | Disposition: A | Discharge status: Home / Self Care | Payer: Medicaid Other | Source: Ambulatory Visit | Attending: General Surgery | Admitting: General Surgery

## 2010-09-16 HISTORY — PX: COLON SURGERY: SHX602

## 2010-09-16 LAB — CBC
MCH: 35.5 pg — ABNORMAL HIGH (ref 26.0–34.0)
MCV: 100 fL (ref 78.0–100.0)
Platelets: 201 10*3/uL (ref 150–400)
RDW: 11.7 % (ref 11.5–15.5)

## 2010-09-24 ENCOUNTER — Inpatient Hospital Stay (HOSPITAL_COMMUNITY)
Admission: RE | Admit: 2010-09-24 | Discharge: 2010-09-28 | DRG: 331 | Disposition: A | Discharge status: Home / Self Care | Payer: Medicaid Other | Source: Ambulatory Visit | Attending: General Surgery | Admitting: General Surgery

## 2010-09-24 ENCOUNTER — Other Ambulatory Visit (INDEPENDENT_AMBULATORY_CARE_PROVIDER_SITE_OTHER): Payer: Self-pay | Admitting: General Surgery

## 2010-09-24 DIAGNOSIS — I10 Essential (primary) hypertension: Secondary | ICD-10-CM | POA: Diagnosis present

## 2010-09-24 DIAGNOSIS — Z432 Encounter for attention to ileostomy: Secondary | ICD-10-CM

## 2010-09-24 DIAGNOSIS — E876 Hypokalemia: Secondary | ICD-10-CM | POA: Diagnosis not present

## 2010-09-24 DIAGNOSIS — F17200 Nicotine dependence, unspecified, uncomplicated: Secondary | ICD-10-CM | POA: Diagnosis present

## 2010-09-24 DIAGNOSIS — C20 Malignant neoplasm of rectum: Principal | ICD-10-CM | POA: Diagnosis present

## 2010-09-25 LAB — CBC
MCH: 34.5 pg — ABNORMAL HIGH (ref 26.0–34.0)
MCV: 100.6 fL — ABNORMAL HIGH (ref 78.0–100.0)
Platelets: 183 10*3/uL (ref 150–400)
RBC: 3.62 MIL/uL — ABNORMAL LOW (ref 4.22–5.81)
RDW: 11.8 % (ref 11.5–15.5)

## 2010-09-25 LAB — BASIC METABOLIC PANEL
BUN: 7 mg/dL (ref 6–23)
Chloride: 102 mEq/L (ref 96–112)
GFR calc Af Amer: >60 mL/min (ref >60)
Glucose, Bld: 125 mg/dL — ABNORMAL HIGH (ref 70–99)
Potassium: 3.3 mEq/L — ABNORMAL LOW (ref 3.5–5.1)
Sodium: 136 mEq/L (ref 135–145)

## 2010-09-25 LAB — PHOSPHORUS: Phosphorus: 2.2 mg/dL — ABNORMAL LOW (ref 2.3–4.6)

## 2010-09-26 LAB — CBC
MCV: 100.8 fL — ABNORMAL HIGH (ref 78.0–100.0)
Platelets: 196 10*3/uL (ref 150–400)
RBC: 3.82 MIL/uL — ABNORMAL LOW (ref 4.22–5.81)
WBC: 11.1 10*3/uL — ABNORMAL HIGH (ref 4.0–10.5)

## 2010-09-26 LAB — BASIC METABOLIC PANEL
CO2: 27 mEq/L (ref 19–32)
Chloride: 100 mEq/L (ref 96–112)
Creatinine, Ser: 0.63 mg/dL (ref 0.50–1.35)
GFR calc Af Amer: >60 mL/min (ref >60)
Potassium: 4.8 mEq/L (ref 3.5–5.1)

## 2010-09-26 LAB — MAGNESIUM: Magnesium: 2.1 mg/dL (ref 1.5–2.5)

## 2010-09-26 LAB — PHOSPHORUS: Phosphorus: 1.7 mg/dL — ABNORMAL LOW (ref 2.3–4.6)

## 2010-09-27 DIAGNOSIS — I10 Essential (primary) hypertension: Secondary | ICD-10-CM

## 2010-09-27 LAB — CBC
Hemoglobin: 11.5 g/dL — ABNORMAL LOW (ref 13.0–17.0)
Platelets: 185 10*3/uL (ref 150–400)
RBC: 3.39 MIL/uL — ABNORMAL LOW (ref 4.22–5.81)
WBC: 6.8 10*3/uL (ref 4.0–10.5)

## 2010-09-27 LAB — BASIC METABOLIC PANEL
CO2: 29 mEq/L (ref 19–32)
Chloride: 102 mEq/L (ref 96–112)
Glucose, Bld: 132 mg/dL — ABNORMAL HIGH (ref 70–99)
Potassium: 3.6 mEq/L (ref 3.5–5.1)
Sodium: 136 mEq/L (ref 135–145)

## 2010-09-29 NOTE — Op Note (Signed)
NAME:  Jeffery [name], Jeffery [name] NO.:  [id number]  MEDICAL RECORD NO.:  [id number]  LOCATION:  2550                         FACILITY:  [facility]  PHYSICIAN:  Almond Lint, MD       DATE OF BIRTH:  [birth year]  DATE OF PROCEDURE:  09/24/2010 DATE OF DISCHARGE:                              OPERATIVE REPORT   PREOPERATIVE DIAGNOSIS:  Rectal cancer, ypT3, ypN0, and MX.  POSTOPERATIVE DIAGNOSIS:  Rectal cancer, ypT3, ypN0, and MX.  PROCEDURE:  Ileostomy takedown with bowel resection.  SURGEON:  Almond Lint, MD  ASSISTANT:  Anselm Pancoast. Zachery Dakins, MD  ANESTHESIA:  General and local.  FINDINGS:  Many adhesions to the posterior abdominal wall.  SPECIMENS:  Stoma site to Pathology.  ESTIMATED BLOOD LOSS:  Minimal.  COMPLICATIONS:  None known.  PROCEDURE:  Jeffery [name] was identified in the holding area and taken to the operating room where he was placed supine on the operating room table.  General anesthesia was induced.  Foley catheter was placed.  A time-out was performed according to surgical safety checklist.  When all was correct, we continued.  The stoma site was closed with a 2-0 Prolene.  The abdomen was then prepped and draped in a sterile fashion. The prior ileostomy site that had the skin around it opened with a #15 blade.  The cautery was used to dissect the subcutaneous tissues.  Allis clamps were used to elevate the skin with the ostomy site and a tonsil was used to skeletonize the ileostomy.  This was extremely adherent and there were several serosal injuries made.  The ileostomy was freed up from the posterior abdominal wall until it was freely come out into the wound. The areas of serosal tears were identified and resected.  This was done with a GIA stapler.  The mesentery was clamped with hemostats, divided, and tied off with 2-0 Vicryl ties.  Once appropriate uninjured bowel was present, this was connected with a 3-0 Vicryl stay suture and the bowel was  opened with the cautery.  The GIA 75 was used to create a side-to- side functional end-to-end ileoileostomy.  The TA-60 was then used to close the defect.  There was good blood supply to the anastomosis and care was taken to make sure that the anastomosis did not demonstrate any bleeding at the staple line.  The mesenteric defect was closed and oversewn to the bowel.  This was allowed to fall back into the abdomen. The fascia was closed in one layer as the muscle was quite scarred. This was done with #1 Novafil pop-off sutures.  The skin over the top of this was irrigated copiously.  The wound was closed with staples and with a Telfa wick in the center.  The On-Q pain pump catheters were advanced through the abdominal wall prior to closure of the fascia and the preperitoneal space.  These were bolused with 0.25% lidocaine.  The wounds were dressed with Covaderm and the On- Q catheter sites were dressed with Steri-Strips and Tegaderm.  The patient was awakened from anesthesia and taken to PACU in stable condition.  Needle, sponge, and instrument counts were correct x2.  Almond Lint, MD     FB/MEDQ  D:  09/24/2010  T:  09/24/2010  Job:  [id number]  Electronically Signed by Almond Lint MD on 09/29/2010 12:28:53 PM

## 2010-09-29 NOTE — Discharge Summary (Signed)
  NAME:  ASBURY, HAIR NO.:  [id number]  MEDICAL RECORD NO.:  [id number]  LOCATION:  5149                         FACILITY:  [facility]  PHYSICIAN:  Almond Lint, MD       DATE OF BIRTH:  09/26/1960  DATE OF ADMISSION:  09/24/2010 DATE OF DISCHARGE:  09/28/2010                              DISCHARGE SUMMARY   FINAL DIAGNOSIS:  Rectal cancer ypT3, ypN0, Mx, status post ileostomy.  PROCEDURE:  Ileostomy takedown.  SECONDARY DIAGNOSIS:  New-onset hypertension.  HOSPITAL COURSE:  Mr. Pinheiro was admitted to the floor following his ileostomy takedown.  He did very well with his Foley catheter removed on postop day #1.  He was started on sips of clears.  He transitioned from a PCA over to pain pills.  His On-Q pain pump was helping because when it ran out, he started requiring more pain medication.  Therefore, this was refilled.  He is not on any home antihypertensives, but his blood pressures stayed reasonably high in the 160s over the 100s.  He was started on a low dose of oral clonidine for this.  This brought it down to 120s to 130s systolic pressure.  He was transitioned over to a full liquid diet and then he started developing some flatus on postop day #3 and he was transitioned to a full liquid diet.  His diet was advanced to regular diet on postop day #4 when he started having bowel movements and more significant amounts of flatus.  He is ambulating on his own.  He has been doing well with his girlfriend doing dressing changes.  He did have a glucose recorded at one point of 390 which appeared to be spurious as this was never replicated.  His hematocrit prior to discharge is 34.1 and normal white count at 6.8, creatinine is normal as well.  He did require some phosphorus repletion at one point with phosphorus at 1.7.  DISPOSITION:  He is going to be discharged to home in improved condition.  INSTRUCTIONS:  He is advised to do no driving for 1-2 weeks and  no lifting for 6-8 weeks.  He should follow up with me in around 3 weeks. He is being discharged to home after lunch today once he has tolerated a regular diet.     Almond Lint, MD     FB/MEDQ  D:  09/28/2010  T:  09/28/2010  Job:  [id number]  Electronically Signed by Almond Lint MD on 09/29/2010 12:29:34 PM

## 2010-10-20 ENCOUNTER — Ambulatory Visit (INDEPENDENT_AMBULATORY_CARE_PROVIDER_SITE_OTHER): Payer: Medicaid Other

## 2010-10-20 DIAGNOSIS — Z4802 Encounter for removal of sutures: Secondary | ICD-10-CM

## 2010-10-20 NOTE — Progress Notes (Signed)
Patient came in for staple removal. Incision looks healed. Applied steri strips.

## 2010-10-29 ENCOUNTER — Encounter: Payer: Self-pay | Admitting: Gastroenterology

## 2011-03-04 ENCOUNTER — Ambulatory Visit (INDEPENDENT_AMBULATORY_CARE_PROVIDER_SITE_OTHER): Payer: Medicaid Other | Admitting: General Surgery

## 2011-03-04 ENCOUNTER — Encounter (INDEPENDENT_AMBULATORY_CARE_PROVIDER_SITE_OTHER): Payer: Self-pay | Admitting: General Surgery

## 2011-03-04 VITALS — BP 132/82 | HR 82 | Temp 97.8°F | Resp 18 | Ht 64.0 in | Wt 139.2 lb

## 2011-03-04 DIAGNOSIS — R1031 Right lower quadrant pain: Secondary | ICD-10-CM

## 2011-03-04 NOTE — Assessment & Plan Note (Signed)
?   Hernia in ileostomy takedown site.   Possible bowel obstruction toward end of last week.    CT scan Labs Advised pt of symptoms of acute obstruction and when to go to ED.

## 2011-03-04 NOTE — Progress Notes (Signed)
HISTORY: Pt is a 51 year old male 14 months out from LAR/ileostomy.  He developed a SBO and prolapsed his omentum through his stoma post operatively.  This was repaired.  He is now 6 months out from ileostomy takedown.  He is having RLQ pain near ostomy site.  This started last week when he had nausea and vomiting.  He had severe pain associated with this.  The nausea and vomiting resolved, but he still has pain.  The pain is worse with standing. He states that the pain is a little better today.  He describes it as stabbing.  He denies fever/chills.     PERTINENT REVIEW OF SYSTEMS: Otherwise negative.     EXAM: Head: Normocephalic and atraumatic.  Eyes:  Conjunctivae are normal. Pupils are equal, round, and reactive to light. No scleral icterus.  Resp: No respiratory distress, normal effort. Abd:  Abdomen is soft, non distended.  Ileostomy takedown site tender.  ? Hernia when cough.  No masses are palpable.  There is no rebound and no guarding. Scab is present over takedown site.  Scab removed.  Site of granulation tissue present.  The scab is removed and the area is cauterized with silver nitrate.   Neurological: Alert and oriented to person, place, and time. Coordination normal.  Skin: Skin is warm and dry. No rash noted. No diaphoretic. No erythema. No pallor.  Psychiatric: Normal mood and affect. Normal behavior. Judgment and thought content normal.    ASSESSMENT AND PLAN:   Abdominal pain, acute, right lower quadrant ? Hernia in ileostomy takedown site.   Possible bowel obstruction toward end of last week.    CT scan Labs Advised pt of symptoms of acute obstruction and when to go to ED.         Maudry Diego, MD Surgical Oncology, General & Endocrine Surgery [facility]  Provider Not In System Almond Lint, MD

## 2011-03-04 NOTE — Patient Instructions (Addendum)
Get CT scan and follow up with me within 2 weeks.

## 2011-03-11 ENCOUNTER — Telehealth (INDEPENDENT_AMBULATORY_CARE_PROVIDER_SITE_OTHER): Payer: Self-pay | Admitting: General Surgery

## 2011-03-11 ENCOUNTER — Other Ambulatory Visit (INDEPENDENT_AMBULATORY_CARE_PROVIDER_SITE_OTHER): Payer: Self-pay | Admitting: General Surgery

## 2011-03-11 ENCOUNTER — Ambulatory Visit
Admission: RE | Admit: 2011-03-11 | Discharge: 2011-03-11 | Disposition: A | Discharge status: Home / Self Care | Payer: Medicaid Other | Source: Ambulatory Visit | Attending: General Surgery | Admitting: General Surgery

## 2011-03-11 DIAGNOSIS — C20 Malignant neoplasm of rectum: Secondary | ICD-10-CM

## 2011-03-11 DIAGNOSIS — R1031 Right lower quadrant pain: Secondary | ICD-10-CM

## 2011-03-11 MED ORDER — IOHEXOL 300 MG/ML  SOLN
100.0000 mL | Freq: Once | INTRAMUSCULAR | Status: AC | PRN
  Administered 2011-03-11: 100 mL via INTRAVENOUS

## 2011-03-11 NOTE — Telephone Encounter (Signed)
Discussed findings of scan.  Reviewed need for labs, adding on CEA.  Will also get gastrografin enema to rule out delayed anastamotic leak or recurrence.    Will refer back to Dr. Truett Perna to evaluate the metastatic disease to the lungs.

## 2011-03-11 NOTE — Telephone Encounter (Signed)
Pt's mother answered the phone. She said he was not home.   I asked her to have him call our office when he gets in.

## 2011-03-12 ENCOUNTER — Telehealth: Payer: Self-pay | Admitting: Oncology

## 2011-03-12 ENCOUNTER — Other Ambulatory Visit: Payer: Self-pay | Admitting: *Deleted

## 2011-03-12 NOTE — Telephone Encounter (Signed)
called pts home s/w mother and provided appt d/t for 02/05

## 2011-03-15 ENCOUNTER — Other Ambulatory Visit (INDEPENDENT_AMBULATORY_CARE_PROVIDER_SITE_OTHER): Payer: Self-pay | Admitting: General Surgery

## 2011-03-15 MED ORDER — AMOXICILLIN-POT CLAVULANATE 500-125 MG PO TABS: 1.0000 | ORAL_TABLET | Freq: Two times a day (BID) | ORAL | Status: DC

## 2011-03-16 ENCOUNTER — Other Ambulatory Visit (INDEPENDENT_AMBULATORY_CARE_PROVIDER_SITE_OTHER): Payer: Self-pay | Admitting: General Surgery

## 2011-03-16 DIAGNOSIS — C20 Malignant neoplasm of rectum: Secondary | ICD-10-CM

## 2011-03-22 ENCOUNTER — Telehealth: Payer: Self-pay | Admitting: Oncology

## 2011-03-22 NOTE — Telephone Encounter (Signed)
called pts home lmovm that appt on 02-05 was r/s to 02-06 and to rtn call to confirm appt

## 2011-03-23 ENCOUNTER — Other Ambulatory Visit: Payer: Medicaid Other | Admitting: Lab

## 2011-03-23 ENCOUNTER — Ambulatory Visit: Payer: Medicaid Other | Admitting: Nurse Practitioner

## 2011-03-24 ENCOUNTER — Ambulatory Visit: Payer: Medicaid Other

## 2011-03-24 ENCOUNTER — Ambulatory Visit (HOSPITAL_BASED_OUTPATIENT_CLINIC_OR_DEPARTMENT_OTHER): Payer: Medicaid Other | Admitting: Nurse Practitioner

## 2011-03-24 ENCOUNTER — Other Ambulatory Visit: Payer: Medicaid Other

## 2011-03-24 ENCOUNTER — Other Ambulatory Visit: Payer: Self-pay | Admitting: *Deleted

## 2011-03-24 ENCOUNTER — Ambulatory Visit: Payer: Medicaid Other | Admitting: Nurse Practitioner

## 2011-03-24 ENCOUNTER — Telehealth: Payer: Self-pay | Admitting: *Deleted

## 2011-03-24 ENCOUNTER — Telehealth: Payer: Self-pay | Admitting: Oncology

## 2011-03-24 VITALS — BP 152/94 | HR 104 | Temp 99.2°F | Ht 64.0 in | Wt 141.6 lb

## 2011-03-24 DIAGNOSIS — C20 Malignant neoplasm of rectum: Secondary | ICD-10-CM

## 2011-03-24 LAB — CBC WITH DIFFERENTIAL/PLATELET
Basophils Absolute: 0 10*3/uL (ref 0.0–0.1)
Eosinophils Absolute: 0.1 10*3/uL (ref 0.0–0.5)
HGB: 13 g/dL (ref 13.0–17.1)
LYMPH%: 13 % — ABNORMAL LOW (ref 14.0–49.0)
MCV: 98.2 fL — ABNORMAL HIGH (ref 79.3–98.0)
MONO#: 0.6 10*3/uL (ref 0.1–0.9)
MONO%: 11 % (ref 0.0–14.0)
NEUT#: 4.4 10*3/uL (ref 1.5–6.5)
Platelets: 270 10*3/uL (ref 140–400)
RBC: 3.91 10*6/uL — ABNORMAL LOW (ref 4.20–5.82)
WBC: 5.9 10*3/uL (ref 4.0–10.3)

## 2011-03-24 LAB — COMPREHENSIVE METABOLIC PANEL
Albumin: 3.3 g/dL — ABNORMAL LOW (ref 3.5–5.2)
Alkaline Phosphatase: 67 U/L (ref 39–117)
BUN: 13 mg/dL (ref 6–23)
CO2: 26 mEq/L (ref 19–32)
Glucose, Bld: 91 mg/dL (ref 70–99)
Potassium: 3.6 mEq/L (ref 3.5–5.3)
Sodium: 139 mEq/L (ref 135–145)
Total Bilirubin: 0.3 mg/dL (ref 0.3–1.2)
Total Protein: 7.8 g/dL (ref 6.0–8.3)

## 2011-03-24 LAB — CEA: CEA: 14.7 ng/mL — ABNORMAL HIGH (ref 0.0–5.0)

## 2011-03-24 NOTE — Telephone Encounter (Signed)
Gv pt appt for [id number].  informed pt that [facility] will call her for biospy appt

## 2011-03-24 NOTE — Telephone Encounter (Signed)
Call from pt reporting he is unable to come in today at 1315-- needs later appt. Pt requested I return call and leave message at his work. Same done. Pt rescheduled to 1515.

## 2011-03-24 NOTE — Progress Notes (Signed)
OFFICE PROGRESS NOTE  Interval history:  Jeffery [name] is a 51 year old man diagnosed with clinical stage III rectal cancer August 2011. He completed pelvic radiation August 24 through November 18, 2009.  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  There was no lymph node involvement (pT3 pN0).  He began adjuvant CAPOX chemotherapy March 11, 2010.  He completed the sixth and final cycle beginning 06/25/2010. He failed to show for several followup visits.  Jeffery [name] was seen by Dr. [name] on 03/04/2011 with abdominal pain. He also recently had nausea/vomiting and decreased bowel movements. CT scan of the abdomen and pelvis on 03/11/2011 showed multiple lung nodules most consistent with metastatic involvement of the lungs. There was no evidence of metastatic involvement of the liver. There was prominent presacral soft tissue.  Jeffery [name] reports that overall he feels well. He continues to note intermittent abdominal pain. No further nausea/vomiting. Bowels moving regularly. He has a good appetite. He denies weight loss. No shortness of breath or cough.   Objective: Blood pressure 152/94, pulse 104, temperature 99.2 F (37.3 C), temperature source Oral, height 5\' 4"  (1.626 m), weight 141 lb 9.6 oz (64.229 kg).  Oropharynx is without thrush or ulceration. No palpable cervical or supraclavicular lymph nodes. Shotty bilateral axillary and inguinal lymph nodes. Lungs clear. No wheezes or rales. Regular cardiac rhythm. No hepatomegaly. Ileostomy takedown scar with surrounding thickness. Scab present at the lateral portion of the scar.  Lab Results: Lab Results  Component Value Date   WBC 5.9 03/24/2011   HGB 13.0 03/24/2011   HCT 38.4 03/24/2011   MCV 98.2*  03/24/2011   PLT 270 03/24/2011    Chemistry:    Chemistry      Component Value Date/Time   NA 136 09/27/2010 0730   K 3.6 09/27/2010 0730   CL 102 09/27/2010 0730   CO2 29 09/27/2010 0730   BUN 7 09/27/2010 0730   CREATININE 0.66 09/27/2010 0730      Component Value Date/Time   CALCIUM 8.4 09/27/2010 0730   ALKPHOS 66 06/25/2010 1327   AST 26 06/25/2010 1327   ALT 19 06/25/2010 1327   BILITOT 0.4 06/25/2010 1327       Studies/Results: Ct Abdomen Pelvis W Contrast  03/11/2011  *RADIOLOGY REPORT*  Clinical Data: History of colorectal carcinoma with colon resection, now with the right lower quadrant abdominal pain for 6 weeks  CT ABDOMEN AND PELVIS WITH CONTRAST  Technique:  Multidetector CT imaging of the abdomen and pelvis was performed following the standard protocol during bolus administration of intravenous contrast.  Contrast: OMNIPAQUE IOHEXOL 300 MG/ML IV SOLN  Comparison: CT abdomen pelvis of 01/27/2010  Findings: There are now multiple lung nodules most consistent with metastatic involvement of the lungs.  With larger nodules as in the periphery the right lower lobe measuring 11 mm in diameter. Multiple other nodules are present throughout both lower lobes, right middle lobe, and lingula.  No pleural effusion is seen.  The liver enhances with no evidence of metastatic involvement.  No ductal dilatation is seen.  The portal vein opacifies normally. The gallbladder is contracted and no calcified gallstones are seen. The pancreas is normal in size and the pancreatic duct is not dilated.  The adrenal glands and spleen are unremarkable.  The stomach is not well distended.  The kidneys enhance with no calculus or mass and no hydronephrosis is seen.  The abdominal aorta is normal in caliber with atheromatous change present.  The celiac axis, SMA, renal arteries and IMA appear to be patent.  There is amorphous soft tissue within the presacral space posterior to the rectum.  Although some of this  soft tissue could be postoperative in nature, recurrence of tumor is a definite consideration.  There is also a bubble of air that appears to be extraluminal within the presacral space, and breakdown of the anastomotic site cannot be excluded . No lytic or blastic bony lesion is seen.  The urinary bladder is not well distended but is unremarkable.  Sutures are noted within the rectum.  Scarring from prior ostomy is noted in the right lower quadrant subcutaneous soft tissues .  IMPRESSION:  1.  Multiple nodules at the lung bases consistent with metastatic involvement of the lungs. 2. Prominent presacral soft tissue.  This could be due to scarring from surgery, but recurrence of tumor cannot be excluded. 3.  Air bubble within the presacral soft tissue appears to be extraluminal, and breakdown of the anastomotic site cannot be excluded.  No discrete abscess is seen.  Original Report Authenticated By: Juline Patch, M.D.    Medications: I have reviewed the patient's current medications.  Assessment/Plan:  1. Clinical stage III (uT3 uN1) rectal cancer status post pelvic radiation, August 24 through November 18, 2009.  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  There was no lymph node involvement (pT3 pN0).  He began adjuvant CAPOX chemotherapy March 11, 2010.  He completed the sixth and final cycle of CAPOX 06/25/2010. 2. Strangulated peristomal hernia and small bowel obstruction status post reduction of peristomal hernia, omentectomy, exploratory laparotomy, small bowel resection x2 on January 28, 2010.   3. History of rectal bleeding secondary to number 1.   4. History of an elevated CEA.  5. History of a left adrenal nodule.  6. Weight loss,  improved.   7. Right knee pain/effusion status post evaluation at [facility].   8. Recent abdominal pain, nausea and constipation-the nausea and constipation have resolved. He continues to have intermittent abdominal pain. 9.  CT abdomen/pelvis 03/11/2011 with multiple nodules at the lung bases consistent with metastatic involvement of the lungs; prominent presacral soft tissue; air bubble within the presacral soft tissue appeared to be extraluminal and breakdown at the anastomotic site could not be excluded. No discrete abscess was seen.  Disposition-Dr. Truett Perna is concerned the bilateral lung nodules represent metastatic disease. We are referring Jeffery [name] for a noncontrast CT scan of the chest and a CT-guided biopsy of a lung nodule. We will followup on the CEA from today. He will return for a followup visit with Dr. Truett Perna 04/06/2011. He will contact the office the interim with any problems.  Patient seen with Dr. Truett Perna. Dr. Truett Perna reviewed the CT scan on the computer with Jeffery [name].  Lonna Cobb ANP/GNP-BC

## 2011-04-06 ENCOUNTER — Telehealth: Payer: Self-pay | Admitting: *Deleted

## 2011-04-06 ENCOUNTER — Ambulatory Visit (HOSPITAL_BASED_OUTPATIENT_CLINIC_OR_DEPARTMENT_OTHER): Payer: Medicaid Other | Admitting: Oncology

## 2011-04-06 VITALS — BP 154/95 | HR 100 | Temp 97.8°F | Ht 64.0 in | Wt 142.5 lb

## 2011-04-06 DIAGNOSIS — R109 Unspecified abdominal pain: Secondary | ICD-10-CM

## 2011-04-06 DIAGNOSIS — C7800 Secondary malignant neoplasm of unspecified lung: Secondary | ICD-10-CM

## 2011-04-06 DIAGNOSIS — C20 Malignant neoplasm of rectum: Secondary | ICD-10-CM

## 2011-04-06 NOTE — Progress Notes (Signed)
OFFICE PROGRESS NOTE   INTERVAL HISTORY:   He returns as scheduled. He he complains of skin irritation at the perineum. He has not undergone a lung biopsy or CT of the chest.  Objective:  Vital signs in last 24 hours:  Blood pressure 154/95, pulse 100, temperature 97.8 F (36.6 C), temperature source Oral, height 5' 4" (1.626 m), weight 142 lb 8 oz (64.638 kg).    HEENT: Oropharynx without visible mass, neck without mass Lymphatics: Pea-sized left posterior cervical/scalene node and bilateral inguinal nodes. No supraclavicular or axillary nodes Resp: Lungs clear bilaterally Cardio: Regular rate and rhythm GI: No hepatomegaly, nontender, no mass, small external hemorrhoids, a few areas of superficial skin breakdown at the perineum. No vesicles Vascular: No leg edema    Portacath/PICC-without erythema  Lab Results:  Lab Results  Component Value Date   WBC 5.9 03/24/2011   HGB 13.0 03/24/2011   HCT 38.4 03/24/2011   MCV 98.2* 03/24/2011   PLT 270 03/24/2011   CEA 14.7 03/24/2011   Medications: I have reviewed the patient's current medications.  Assessment/Plan: 1. Clinical stage III (uT3 uN1) rectal cancer status post pelvic radiation, August 24 through November 18, 2009. 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 There was no lymph node involvement (pT3 pN0). He began adjuvant CAPOX chemotherapy March 11, 2010. He completed the sixth and final cycle of CAPOX 06/25/2010.            -CT of the abdomen and pelvis 03/11/2011 confirmed multiple nodules at the lung bases consistent with metastatic disease. 2. Strangulated peristomal hernia and small bowel obstruction status post reduction of peristomal hernia, omentectomy, exploratory  laparotomy, small bowel resection x2 on January 28, 2010.  3. History of rectal bleeding secondary to number 1.  4. History of an elevated CEA.  5. History of a left adrenal nodule.  6. Weight loss, improved. His weight has increased since the summer of 2012. 7. Right knee pain/effusion status post evaluation at [facility].  8. Recent abdominal pain, nausea and constipation-the nausea and constipation have resolved. He continues to have intermittent abdominal pain. 9. CT abdomen/pelvis 03/11/2011 with multiple nodules at the lung bases consistent with metastatic involvement of the lungs; prominent presacral soft tissue; air bubble within the presacral soft tissue appeared to be extraluminal and breakdown at the anastomotic site could not be excluded. No discrete abscess was seen.   Disposition:  He appears stable. The lung biopsy was not scheduled by interventional radiology. We contacted interventional radiology and this will be scheduled as soon as possible. The clinical history, CT of the abdomen, and the CEA are consistent with a diagnosis of metastatic rectal cancer . I discussed the diagnosis and treatment options with Mr. Mcgourty today. He has previously been treated with CAPOX. I recommend switching to irinotecan-based regimen. I recommend FOLFIRI/Avastin therapy.  We reviewed the potential toxicities associated with this regimen including a chance for nausea/vomiting, mucositis, diarrhea, alopecia, and hematologic toxicity. We also discussed the bleeding, delayed wound healing, bowel perforation, hypertension, renal toxicity, and thromboembolic disease associated with Avastin. He will attend chemotherapy teaching class.  We will make a referral to Dr. Byerly for placement of a Port-A-Cath. The plan is to initiate systemic therapy with FOLFIRI/Avastin on 04/20/2011. He will return for an office visit and chemotherapy on 05/04/2011.  He'll   be scheduled for a lung biopsy to confirm  the diagnosis of metastatic rectal cancer.   Kyleena Scheirer BRADLEY, MD  04/06/2011  4:58 PM     

## 2011-04-06 NOTE — Telephone Encounter (Signed)
Called to follow up on status of CT chest and CT lung biopsy that was ordered on 03/24/11. Had not been scheduled. Radiology will process asap and call patient with appointment.

## 2011-04-08 ENCOUNTER — Encounter (HOSPITAL_BASED_OUTPATIENT_CLINIC_OR_DEPARTMENT_OTHER): Payer: Self-pay | Admitting: *Deleted

## 2011-04-08 ENCOUNTER — Telehealth: Payer: Self-pay | Admitting: *Deleted

## 2011-04-08 ENCOUNTER — Telehealth (INDEPENDENT_AMBULATORY_CARE_PROVIDER_SITE_OTHER): Payer: Self-pay

## 2011-04-08 NOTE — Telephone Encounter (Signed)
Left message with male at home # to have pt call office. No answer at work #

## 2011-04-08 NOTE — Telephone Encounter (Signed)
Hospital calling b/c they need surgical orders completed in epic for pt's surgery on 04-12-11.

## 2011-04-08 NOTE — Telephone Encounter (Signed)
Pt returned call. He is scheduled for port a cath on 2/25 with Dr. [name]. He will need CT chest before biopsy can be scheduled. CT scheduled for 2/26 before chemo class. Pt to arrive at [facility] radiology at 1545. He verbalized understanding.

## 2011-04-08 NOTE — Progress Notes (Signed)
Pt staying with mom No labs ordered-ccs called about no orders

## 2011-04-12 ENCOUNTER — Encounter (HOSPITAL_BASED_OUTPATIENT_CLINIC_OR_DEPARTMENT_OTHER)
Admission: RE | Disposition: A | Discharge status: Home / Self Care | Payer: Self-pay | Source: Ambulatory Visit | Attending: General Surgery

## 2011-04-12 ENCOUNTER — Ambulatory Visit (HOSPITAL_BASED_OUTPATIENT_CLINIC_OR_DEPARTMENT_OTHER)
Admission: RE | Admit: 2011-04-12 | Discharge: 2011-04-12 | Disposition: A | Discharge status: Home / Self Care | Payer: Medicaid Other | Source: Ambulatory Visit | Attending: General Surgery | Admitting: General Surgery

## 2011-04-12 ENCOUNTER — Encounter (HOSPITAL_BASED_OUTPATIENT_CLINIC_OR_DEPARTMENT_OTHER): Payer: Self-pay | Admitting: Anesthesiology

## 2011-04-12 ENCOUNTER — Ambulatory Visit (HOSPITAL_COMMUNITY): Payer: Medicaid Other

## 2011-04-12 ENCOUNTER — Ambulatory Visit (HOSPITAL_BASED_OUTPATIENT_CLINIC_OR_DEPARTMENT_OTHER): Payer: Medicaid Other | Admitting: Anesthesiology

## 2011-04-12 ENCOUNTER — Other Ambulatory Visit (HOSPITAL_COMMUNITY): Payer: Medicaid Other

## 2011-04-12 ENCOUNTER — Encounter (HOSPITAL_BASED_OUTPATIENT_CLINIC_OR_DEPARTMENT_OTHER): Payer: Self-pay | Admitting: *Deleted

## 2011-04-12 DIAGNOSIS — C801 Malignant (primary) neoplasm, unspecified: Secondary | ICD-10-CM | POA: Insufficient documentation

## 2011-04-12 DIAGNOSIS — I10 Essential (primary) hypertension: Secondary | ICD-10-CM | POA: Insufficient documentation

## 2011-04-12 DIAGNOSIS — C20 Malignant neoplasm of rectum: Secondary | ICD-10-CM

## 2011-04-12 HISTORY — DX: Malignant (primary) neoplasm, unspecified: C80.1

## 2011-04-12 HISTORY — PX: PORTACATH PLACEMENT: SHX2246

## 2011-04-12 SURGERY — INSERTION, TUNNELED CENTRAL VENOUS DEVICE, WITH PORT
Anesthesia: General | Site: Chest | Wound class: Clean

## 2011-04-12 MED ORDER — LACTATED RINGERS IV SOLN
INTRAVENOUS | Status: DC
  Administered 2011-04-12: 14:00:00 via INTRAVENOUS

## 2011-04-12 MED ORDER — DEXAMETHASONE SODIUM PHOSPHATE 4 MG/ML IJ SOLN
INTRAMUSCULAR | Status: DC | PRN
  Administered 2011-04-12: 10 mg via INTRAVENOUS

## 2011-04-12 MED ORDER — MIDAZOLAM HCL 5 MG/5ML IJ SOLN
INTRAMUSCULAR | Status: DC | PRN
  Administered 2011-04-12: 2 mg via INTRAVENOUS

## 2011-04-12 MED ORDER — PROPOFOL 10 MG/ML IV EMUL
INTRAVENOUS | Status: DC | PRN
  Administered 2011-04-12: 50 mg via INTRAVENOUS
  Administered 2011-04-12: 200 mg via INTRAVENOUS

## 2011-04-12 MED ORDER — BUPIVACAINE-EPINEPHRINE 0.5% -1:200000 IJ SOLN
INTRAMUSCULAR | Status: DC | PRN
  Administered 2011-04-12: 7 mL

## 2011-04-12 MED ORDER — HEPARIN SOD (PORK) LOCK FLUSH 100 UNIT/ML IV SOLN
INTRAVENOUS | Status: DC | PRN
  Administered 2011-04-12: 500 [IU] via INTRAVENOUS

## 2011-04-12 MED ORDER — FENTANYL CITRATE 0.05 MG/ML IJ SOLN: 50.0000 ug | INTRAMUSCULAR | Status: DC | PRN

## 2011-04-12 MED ORDER — LABETALOL HCL 5 MG/ML IV SOLN
5.0000 mg | Freq: Once | INTRAVENOUS | Status: AC
  Administered 2011-04-12 (×2): 5 mg via INTRAVENOUS

## 2011-04-12 MED ORDER — ONDANSETRON HCL 4 MG/2ML IJ SOLN
INTRAMUSCULAR | Status: DC | PRN
  Administered 2011-04-12: 4 mg via INTRAVENOUS

## 2011-04-12 MED ORDER — LABETALOL HCL 5 MG/ML IV SOLN
5.0000 mg | Freq: Once | INTRAVENOUS | Status: AC
  Administered 2011-04-12: 5 mg via INTRAVENOUS

## 2011-04-12 MED ORDER — HEPARIN (PORCINE) IN NACL 2-0.9 UNIT/ML-% IJ SOLN
INTRAMUSCULAR | Status: DC | PRN
  Administered 2011-04-12: 1 via INTRAVENOUS

## 2011-04-12 MED ORDER — FENTANYL CITRATE 0.05 MG/ML IJ SOLN
INTRAMUSCULAR | Status: DC | PRN
  Administered 2011-04-12 (×2): 50 ug via INTRAVENOUS

## 2011-04-12 MED ORDER — MIDAZOLAM HCL 2 MG/2ML IJ SOLN: 1.0000 mg | INTRAMUSCULAR | Status: DC | PRN

## 2011-04-12 MED ORDER — HYDROMORPHONE HCL PF 1 MG/ML IJ SOLN
0.2500 mg | INTRAMUSCULAR | Status: DC | PRN
Start: 2011-04-12 — End: 2011-04-12

## 2011-04-12 MED ORDER — LORAZEPAM 2 MG/ML IJ SOLN: 1.0000 mg | Freq: Once | INTRAMUSCULAR | Status: DC | PRN

## 2011-04-12 MED ORDER — PROMETHAZINE HCL 25 MG/ML IJ SOLN: 6.2500 mg | INTRAMUSCULAR | Status: DC | PRN

## 2011-04-12 MED ORDER — CEFAZOLIN SODIUM 1-5 GM-% IV SOLN
1.0000 g | INTRAVENOUS | Status: AC
  Administered 2011-04-12: 1 g via INTRAVENOUS

## 2011-04-12 MED ORDER — OXYCODONE HCL 5 MG PO TABS: 5.0000 mg | ORAL_TABLET | ORAL | Status: AC | PRN

## 2011-04-12 SURGICAL SUPPLY — 40 items
BAG DECANTER FOR FLEXI CONT (MISCELLANEOUS) ×2 IMPLANT
BLADE SURG 11 STRL SS (BLADE) ×2 IMPLANT
BLADE SURG 15 STRL LF DISP TIS (BLADE) ×1 IMPLANT
BLADE SURG 15 STRL SS (BLADE) ×1
CHLORAPREP W/TINT 26ML (MISCELLANEOUS) ×2 IMPLANT
CLOTH BEACON ORANGE TIMEOUT ST (SAFETY) ×2 IMPLANT
COVER MAYO STAND STRL (DRAPES) ×2 IMPLANT
COVER TABLE BACK 60X90 (DRAPES) ×2 IMPLANT
DECANTER SPIKE VIAL GLASS SM (MISCELLANEOUS) IMPLANT
DERMABOND ADVANCED (GAUZE/BANDAGES/DRESSINGS) ×1
DERMABOND ADVANCED .7 DNX12 (GAUZE/BANDAGES/DRESSINGS) ×1 IMPLANT
DRAPE C-ARM 42X72 X-RAY (DRAPES) ×2 IMPLANT
DRAPE LAPAROTOMY TRNSV 102X78 (DRAPE) ×2 IMPLANT
DRAPE UTILITY XL STRL (DRAPES) ×2 IMPLANT
ELECT REM PT RETURN 9FT ADLT (ELECTROSURGICAL) ×2
ELECTRODE REM PT RTRN 9FT ADLT (ELECTROSURGICAL) ×1 IMPLANT
GLOVE BIO SURGEON STRL SZ 6 (GLOVE) ×2 IMPLANT
GLOVE BIOGEL M STRL SZ7.5 (GLOVE) ×4 IMPLANT
GLOVE BIOGEL PI IND STRL 6.5 (GLOVE) ×1 IMPLANT
GLOVE BIOGEL PI IND STRL 8 (GLOVE) ×2 IMPLANT
GLOVE BIOGEL PI INDICATOR 6.5 (GLOVE) ×1
GLOVE BIOGEL PI INDICATOR 8 (GLOVE) ×2
GOWN PREVENTION PLUS XLARGE (GOWN DISPOSABLE) IMPLANT
GOWN PREVENTION PLUS XXLARGE (GOWN DISPOSABLE) ×4 IMPLANT
IV HEPARIN 1000UNITS/500ML (IV SOLUTION) IMPLANT
KIT POWER CATH 8FR (Catheter) ×2 IMPLANT
NEEDLE HYPO 25X1 1.5 SAFETY (NEEDLE) ×2 IMPLANT
PACK BASIN DAY SURGERY FS (CUSTOM PROCEDURE TRAY) ×2 IMPLANT
PENCIL BUTTON HOLSTER BLD 10FT (ELECTRODE) ×2 IMPLANT
SLEEVE SCD COMPRESS KNEE MED (MISCELLANEOUS) ×2 IMPLANT
SUT MNCRL AB 4-0 PS2 18 (SUTURE) ×2 IMPLANT
SUT PROLENE 2 0 SH DA (SUTURE) ×4 IMPLANT
SUT VIC AB 3-0 SH 27 (SUTURE) ×1
SUT VIC AB 3-0 SH 27X BRD (SUTURE) ×1 IMPLANT
SUT VICRYL 3-0 CR8 SH (SUTURE) IMPLANT
SYR 5ML LUER SLIP (SYRINGE) ×2 IMPLANT
SYR CONTROL 10ML LL (SYRINGE) ×2 IMPLANT
TOWEL OR 17X24 6PK STRL BLUE (TOWEL DISPOSABLE) ×2 IMPLANT
TOWEL OR NON WOVEN STRL DISP B (DISPOSABLE) ×2 IMPLANT
WATER STERILE IRR 1000ML POUR (IV SOLUTION) IMPLANT

## 2011-04-12 NOTE — Interval H&P Note (Signed)
History and Physical Interval Note:  04/12/2011 3:19 PM  Jeffery [name]  has presented today for surgery, with the diagnosis of rectal cancer  The various methods of treatment have been discussed with the patient and family. After consideration of risks, benefits and other options for treatment, the patient has consented to  Procedure(s) (LRB): INSERTION PORT-A-CATH (N/A) as a surgical intervention .  The patients' history has been reviewed, patient examined, no change in status, stable for surgery.  I have reviewed the patients' chart and labs.  Questions were answered to the patient's satisfaction.     [name]

## 2011-04-12 NOTE — Anesthesia Preprocedure Evaluation (Signed)
Anesthesia Evaluation  Patient identified by MRN, date of birth, ID band Patient awake    Reviewed: Allergy & Precautions, H&P , NPO status , Patient's Chart, lab work & pertinent test results  Airway Mallampati: I TM Distance: >3 FB Neck ROM: Full    Dental   Pulmonary    Pulmonary exam normal       Cardiovascular hypertension,     Neuro/Psych    GI/Hepatic   Endo/Other    Renal/GU      Musculoskeletal   Abdominal Normal abdominal exam  (+)   Peds  Hematology   Anesthesia Other Findings   Reproductive/Obstetrics                           Anesthesia Physical Anesthesia Plan  ASA: III  Anesthesia Plan: General   Post-op Pain Management:    Induction: Intravenous  Airway Management Planned: LMA  Additional Equipment:   Intra-op Plan:   Post-operative Plan: Extubation in OR  Informed Consent: I have reviewed the patients History and Physical, chart, labs and discussed the procedure including the risks, benefits and alternatives for the proposed anesthesia with the patient or authorized representative who has indicated his/her understanding and acceptance.     Plan Discussed with: CRNA and Surgeon  Anesthesia Plan Comments:         Anesthesia Quick Evaluation

## 2011-04-12 NOTE — Discharge Instructions (Addendum)
[facility] Office Phone Number [phone]   POST OP INSTRUCTIONS  Always review your discharge instruction sheet given to you by the facility where your surgery was performed.  IF YOU HAVE DISABILITY OR FAMILY LEAVE FORMS, YOU MUST BRING THEM TO THE OFFICE FOR PROCESSING.  DO NOT GIVE THEM TO YOUR DOCTOR.  1. A prescription for pain medication may be given to you upon discharge.  Take your pain medication as prescribed, if needed.  If narcotic pain medicine is not needed, then you may take acetaminophen (Tylenol) or ibuprofen (Advil) as needed. 2. Take your usually prescribed medications unless otherwise directed 3. If you need a refill on your pain medication, please contact your pharmacy.  They will contact our office to request authorization.  Prescriptions will not be filled after 5pm or on week-ends. 4. You should eat very light the first 24 hours after surgery, such as soup, crackers, pudding, etc.  Resume your normal diet the day after surgery 5. It is common to experience some constipation if taking pain medication after surgery.  Increasing fluid intake and taking a stool softener will usually help or prevent this problem from occurring.  A mild laxative (Milk of Magnesia or Miralax) should be taken according to package directions if there are no bowel movements after 48 hours. 6. You may shower in 48 hours.  The surgical glue will flake off in 2-3 weeks.   7. ACTIVITIES:  No strenuous activity or heavy lifting for 1 week.   a. You may drive when you no longer are taking prescription pain medication, you can comfortably wear a seatbelt, and you can safely maneuver your car and apply brakes. b. RETURN TO WORK:  __________not applicable at this time_______________ Jeffery [name] should see your doctor in the office for a follow-up appointment approximately three-four weeks after your surgery.    WHEN TO CALL YOUR DOCTOR: 1. Fever over 101.0 2. Nausea and/or vomiting. 3. Extreme  swelling or bruising. 4. Continued bleeding from incision. 5. Increased pain, redness, or drainage from the incision.  The clinic staff is available to answer your questions during regular business hours.  Please don't hesitate to call and ask to speak to one of the nurses for clinical concerns.  If you have a medical emergency, go to the nearest emergency room or call 911.  A surgeon from [facility] is always on call at the hospital.      For further questions, please visit [email or web address]    [facility]  [address] [place], [place] [address] [phone]   Post Anesthesia Home Care Instructions  Activity: Get plenty of rest for the remainder of the day. A responsible adult should stay with you for 24 hours following the procedure.  For the next 24 hours, DO NOT: -Drive a car -Advertising copywriter -Drink alcoholic beverages -Take any medication unless instructed by your physician -Make any legal decisions or sign important papers.  Meals: Start with liquid foods such as gelatin or soup. Progress to regular foods as tolerated. Avoid greasy, spicy, heavy foods. If nausea and/or vomiting occur, drink only clear liquids until the nausea and/or vomiting subsides. Call your physician if vomiting continues.  Special Instructions/Symptoms: Your throat may feel dry or sore from the anesthesia or the breathing tube placed in your throat during surgery. If this causes discomfort, gargle with warm salt water. The discomfort should disappear within 24 hours.

## 2011-04-12 NOTE — Anesthesia Procedure Notes (Signed)
Procedure Name: LMA Insertion Performed by: Sharyne Richters Pre-anesthesia Checklist: Patient identified, Timeout performed, Emergency Drugs available, Suction available and Patient being monitored Patient Re-evaluated:Patient Re-evaluated prior to inductionOxygen Delivery Method: Circle system utilized Preoxygenation: Pre-oxygenation with 100% oxygen Intubation Type: IV induction Ventilation: Mask ventilation without difficulty LMA: LMA inserted LMA Size: 4.0 Placement Confirmation: breath sounds checked- equal and bilateral and positive ETCO2 Tube secured with: Tape Dental Injury: Teeth and Oropharynx as per pre-operative assessment

## 2011-04-12 NOTE — Op Note (Signed)
PREOPERATIVE DIAGNOSIS:  Rectal cancer, recurrent metastatic     POSTOPERATIVE DIAGNOSIS:  Same     PROCEDURE: Right subclavian ultrasound-guided port placement, Bard   Power Port, MRI safe, 8-French.      SURGEON:  Almond Lint, MD      ANESTHESIA:  General   FINDINGS:  Good venous return, easy flush, and tip of the catheter and   SVC 23 cm.      SPECIMEN:  None.      ESTIMATED BLOOD LOSS:  Minimal.      COMPLICATIONS:  None known.      PROCEDURE:  Pt was identified in the holding area and taken to   the operating room, where patient was placed supine on the operating room   table.  MAC anesthesia was induced.  Patient's arms were tucked and the upper   chest and neck were prepped and draped in sterile fashion.  Time-out was   performed according to the surgical safety check list.  When all was   correct, we continued.   Local anesthetic was administered over this   area at the angle of the clavicle.  The vein was accessed with 2 passes of the needle. There was good venous return and the wire passed easily with no ectopy.   Fluoroscopy was used to confirm that the wire was in the vena cava.      The patient was placed back level and the area for the pocket was anethetized   with local anesthetic.  A 3-cm transverse incision was made with a #15   blade.  Cautery was used to divide the subcutaneous tissues down to the   pectoralis muscle.  An [employer] retractor was used to elevate the skin   while a pocket was created on top of the pectoralis fascia.  The port   was placed into the pocket to confirm that it was of adequate size.  The   catheter was preattached to the port.  The port was then secured to the   pectoralis fascia with four 2-0 Prolene sutures.  These were clamped and   not tied down yet.    The catheter was tunneled through to the wire exit   site.  The catheter was placed along the wire to determine what length it should be to be in the SVC.  The catheter was cut  at 23 cm.  The tunneler sheath and dilator were passed over the wire and the dilator and wire were removed.  The catheter was advanced through the tunneler sheath and the tunneler sheath was pulled away.  Care was taken to keep the catheter in the tunneler sheath as this occurred. This was advanced and the tunneler sheath was removed.  There was good venous   return and easy flush of the catheter.  The Prolene sutures were tied   down to the pectoral fascia.  The skin was reapproximated using 3-0   Vicryl interrupted deep dermal sutures.    Fluoroscopy was used to re-confirm good position of the catheter.  The skin   was then closed using 4-0 Monocryl in a subcuticular fashion.  The port was flushed with concentrated heparin flush as well.  The wounds were then cleaned, dried, and dressed with Dermabond.  The patient was awakened from anesthesia and taken to the PACU in stable condition.  Needle, sponge, and instrument counts were correct.               Almond Lint, MD

## 2011-04-12 NOTE — H&P (View-Only) (Signed)
OFFICE PROGRESS NOTE   INTERVAL HISTORY:   He returns as scheduled. He he complains of skin irritation at the perineum. He has not undergone a lung biopsy or CT of the chest.  Objective:  Vital signs in last 24 hours:  Blood pressure 154/95, pulse 100, temperature 97.8 F (36.6 C), temperature source Oral, height 5\' 4"  (1.626 m), weight 142 lb 8 oz (64.638 kg).    HEENT: Oropharynx without visible mass, neck without mass Lymphatics: Pea-sized left posterior cervical/scalene node and bilateral inguinal nodes. No supraclavicular or axillary nodes Resp: Lungs clear bilaterally Cardio: Regular rate and rhythm GI: No hepatomegaly, nontender, no mass, small external hemorrhoids, a few areas of superficial skin breakdown at the perineum. No vesicles Vascular: No leg edema    Portacath/PICC-without erythema  Lab Results:  Lab Results  Component Value Date   WBC 5.9 03/24/2011   HGB 13.0 03/24/2011   HCT 38.4 03/24/2011   MCV 98.2* 03/24/2011   PLT 270 03/24/2011   CEA 14.7 03/24/2011   Medications: I have reviewed the patient's current medications.  Assessment/Plan: 1. Clinical stage III (uT3 uN1) rectal cancer status post pelvic radiation, August 24 through November 18, 2009. 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 There was no lymph node involvement (pT3 pN0). He began adjuvant CAPOX chemotherapy March 11, 2010. He completed the sixth and final cycle of CAPOX 06/25/2010.            -CT of the abdomen and pelvis 03/11/2011 confirmed multiple nodules at the lung bases consistent with metastatic disease. 2. Strangulated peristomal hernia and small bowel obstruction status post reduction of peristomal hernia, omentectomy, exploratory  laparotomy, small bowel resection x2 on January 28, 2010.  3. History of rectal bleeding secondary to number 1.  4. History of an elevated CEA.  5. History of a left adrenal nodule.  6. Weight loss, improved. His weight has increased since the summer of 2012. 7. Right knee pain/effusion status post evaluation at [facility].  8. Recent abdominal pain, nausea and constipation-the nausea and constipation have resolved. He continues to have intermittent abdominal pain. 9. CT abdomen/pelvis 03/11/2011 with multiple nodules at the lung bases consistent with metastatic involvement of the lungs; prominent presacral soft tissue; air bubble within the presacral soft tissue appeared to be extraluminal and breakdown at the anastomotic site could not be excluded. No discrete abscess was seen.   Disposition:  He appears stable. The lung biopsy was not scheduled by interventional radiology. We contacted interventional radiology and this will be scheduled as soon as possible. The clinical history, CT of the abdomen, and the CEA are consistent with a diagnosis of metastatic rectal cancer . I discussed the diagnosis and treatment options with Mr. Belter today. He has previously been treated with CAPOX. I recommend switching to irinotecan-based regimen. I recommend FOLFIRI/Avastin therapy.  We reviewed the potential toxicities associated with this regimen including a chance for nausea/vomiting, mucositis, diarrhea, alopecia, and hematologic toxicity. We also discussed the bleeding, delayed wound healing, bowel perforation, hypertension, renal toxicity, and thromboembolic disease associated with Avastin. He will attend chemotherapy teaching class.  We will make a referral to Dr. [name] for placement of a Port-A-Cath. The plan is to initiate systemic therapy with FOLFIRI/Avastin on 04/20/2011. He will return for an office visit and chemotherapy on 05/04/2011.  He'll  be scheduled for a lung biopsy to confirm  the diagnosis of metastatic rectal cancer.   Lucile Shutters, MD  04/06/2011  4:58 PM

## 2011-04-12 NOTE — Transfer of Care (Signed)
Immediate Anesthesia Transfer of Care Note  Patient: Jeffery [name]  Procedure(s) Performed: Procedure(s) (LRB): INSERTION PORT-A-CATH (N/A)  Patient Location: PACU  Anesthesia Type: General  Level of Consciousness: sedated  Airway & Oxygen Therapy: Patient Spontanous Breathing and Patient connected to face mask oxygen  Post-op Assessment: Report given to PACU RN and Post -op Vital signs reviewed and stable  Post vital signs: Reviewed and stable  Complications: No apparent anesthesia complications

## 2011-04-12 NOTE — Anesthesia Postprocedure Evaluation (Signed)
  Anesthesia Post-op Note  Patient: Jeffery [name]  Procedure(s) Performed: Procedure(s) (LRB): INSERTION PORT-A-CATH (N/A)  Patient Location: PACU  Anesthesia Type: General  Level of Consciousness: awake  Airway and Oxygen Therapy: Patient Spontanous Breathing  Post-op Pain: mild  Post-op Assessment: Post-op Vital signs reviewed, Patient's Cardiovascular Status Stable, Respiratory Function Stable, Patent Airway and No signs of Nausea or vomiting  Post-op Vital Signs: stable  Complications: No apparent anesthesia complications

## 2011-04-13 ENCOUNTER — Ambulatory Visit (HOSPITAL_COMMUNITY)
Admission: RE | Admit: 2011-04-13 | Discharge: 2011-04-13 | Disposition: A | Discharge status: Home / Self Care | Payer: Medicaid Other | Source: Ambulatory Visit | Attending: Nurse Practitioner | Admitting: Nurse Practitioner

## 2011-04-13 ENCOUNTER — Other Ambulatory Visit: Payer: Medicaid Other

## 2011-04-13 ENCOUNTER — Encounter: Payer: Self-pay | Admitting: *Deleted

## 2011-04-13 DIAGNOSIS — Q619 Cystic kidney disease, unspecified: Secondary | ICD-10-CM | POA: Insufficient documentation

## 2011-04-13 DIAGNOSIS — R911 Solitary pulmonary nodule: Secondary | ICD-10-CM | POA: Insufficient documentation

## 2011-04-13 DIAGNOSIS — C20 Malignant neoplasm of rectum: Secondary | ICD-10-CM

## 2011-04-13 DIAGNOSIS — E279 Disorder of adrenal gland, unspecified: Secondary | ICD-10-CM | POA: Insufficient documentation

## 2011-04-14 ENCOUNTER — Other Ambulatory Visit: Payer: Self-pay | Admitting: *Deleted

## 2011-04-14 ENCOUNTER — Encounter (HOSPITAL_BASED_OUTPATIENT_CLINIC_OR_DEPARTMENT_OTHER): Payer: Self-pay | Admitting: General Surgery

## 2011-04-14 ENCOUNTER — Telehealth: Payer: Self-pay | Admitting: *Deleted

## 2011-04-14 DIAGNOSIS — C20 Malignant neoplasm of rectum: Secondary | ICD-10-CM

## 2011-04-14 MED ORDER — LIDOCAINE-PRILOCAINE 2.5-2.5 % EX CREA: TOPICAL_CREAM | CUTANEOUS | Status: DC | PRN

## 2011-04-14 MED ORDER — PROCHLORPERAZINE MALEATE 10 MG PO TABS: 10.0000 mg | ORAL_TABLET | Freq: Four times a day (QID) | ORAL | Status: DC | PRN

## 2011-04-14 NOTE — Telephone Encounter (Signed)
1st available for lung biopsy is 04/20/11 (having chemo tx that day). Could do at [facility] 3/6, but may not be able to have 5FU infusing. Next available is 04/26/11, and he has something else scheduled that day. Will follow up with physician on how to proceed.

## 2011-04-15 NOTE — Telephone Encounter (Signed)
Biopsy 3/5,  Mover chemo. To after the biopsy

## 2011-04-16 ENCOUNTER — Encounter: Payer: Self-pay | Admitting: Oncology

## 2011-04-16 ENCOUNTER — Telehealth: Payer: Self-pay | Admitting: *Deleted

## 2011-04-16 ENCOUNTER — Other Ambulatory Visit: Payer: Self-pay | Admitting: Radiology

## 2011-04-16 NOTE — Telephone Encounter (Signed)
Made patient aware that Dr. Truett Perna wants him to have the lung biopsy prior to his chemo. 1st priority is to have biopsy on 04/20/11 as radiology has scheduled and will reschedule chemo. Patient agrees. Tiffany at [facility] radiology dept will call patient on his mobile # to coordinate the biopsy for 04/20/11.

## 2011-04-18 ENCOUNTER — Other Ambulatory Visit: Payer: Self-pay | Admitting: Oncology

## 2011-04-19 ENCOUNTER — Other Ambulatory Visit: Payer: Self-pay | Admitting: *Deleted

## 2011-04-19 ENCOUNTER — Other Ambulatory Visit: Payer: Self-pay | Admitting: Radiology

## 2011-04-19 ENCOUNTER — Telehealth: Payer: Self-pay | Admitting: Oncology

## 2011-04-19 NOTE — Telephone Encounter (Signed)
called pts home s/w mother ethel and provided appt for 03/07 and to inform pt to pick march schedule at the time of appt

## 2011-04-20 ENCOUNTER — Ambulatory Visit (HOSPITAL_COMMUNITY)
Admission: RE | Admit: 2011-04-20 | Discharge: 2011-04-20 | Disposition: A | Discharge status: Home / Self Care | Payer: Medicaid Other | Source: Ambulatory Visit | Attending: Oncology | Admitting: Oncology

## 2011-04-20 ENCOUNTER — Ambulatory Visit (HOSPITAL_COMMUNITY)
Admission: RE | Admit: 2011-04-20 | Discharge: 2011-04-20 | Disposition: A | Discharge status: Home / Self Care | Payer: Medicaid Other | Source: Ambulatory Visit | Attending: Interventional Radiology | Admitting: Interventional Radiology

## 2011-04-20 ENCOUNTER — Ambulatory Visit (HOSPITAL_COMMUNITY)
Admission: RE | Admit: 2011-04-20 | Discharge: 2011-04-20 | Disposition: A | Discharge status: Home / Self Care | Payer: Medicaid Other | Source: Ambulatory Visit | Attending: Nurse Practitioner | Admitting: Nurse Practitioner

## 2011-04-20 ENCOUNTER — Encounter (HOSPITAL_COMMUNITY): Payer: Self-pay

## 2011-04-20 ENCOUNTER — Ambulatory Visit: Payer: Medicaid Other

## 2011-04-20 DIAGNOSIS — C7800 Secondary malignant neoplasm of unspecified lung: Secondary | ICD-10-CM | POA: Insufficient documentation

## 2011-04-20 DIAGNOSIS — C20 Malignant neoplasm of rectum: Secondary | ICD-10-CM

## 2011-04-20 HISTORY — DX: Essential (primary) hypertension: I10

## 2011-04-20 LAB — CBC
HCT: 41.2 % (ref 39.0–52.0)
Platelets: 219 10*3/uL (ref 150–400)
RBC: 4.32 MIL/uL (ref 4.22–5.81)
RDW: 12.6 % (ref 11.5–15.5)
WBC: 7.7 10*3/uL (ref 4.0–10.5)

## 2011-04-20 LAB — APTT: aPTT: 32 seconds (ref 24–37)

## 2011-04-20 LAB — PROTIME-INR: INR: 0.95 (ref 0.00–1.49)

## 2011-04-20 MED ORDER — SODIUM CHLORIDE 0.9 % IV SOLN
Freq: Once | INTRAVENOUS | Status: AC
  Administered 2011-04-20: 09:00:00 via INTRAVENOUS

## 2011-04-20 MED ORDER — MIDAZOLAM HCL 5 MG/5ML IJ SOLN
INTRAMUSCULAR | Status: AC | PRN
  Administered 2011-04-20: 2 mg via INTRAVENOUS

## 2011-04-20 MED ORDER — FENTANYL CITRATE 0.05 MG/ML IJ SOLN
INTRAMUSCULAR | Status: AC | PRN
  Administered 2011-04-20: 100 ug via INTRAVENOUS

## 2011-04-20 MED ORDER — MIDAZOLAM HCL 2 MG/2ML IJ SOLN
INTRAMUSCULAR | Status: DC
Start: 2011-04-20 — End: 2011-04-21
  Filled 2011-04-20: qty 6

## 2011-04-20 MED ORDER — FENTANYL CITRATE 0.05 MG/ML IJ SOLN
INTRAMUSCULAR | Status: AC
  Filled 2011-04-20: qty 6

## 2011-04-20 NOTE — Discharge Instructions (Signed)
Needle Biopsy of Lung °Care After °A needle biopsy is a procedure to get a sample of cells from your body for testing. A needle biopsy may be used to take tissue or fluid samples from muscles, bones and organs, such as the liver or lungs. °The sample from your needle biopsy may help your doctor determine what is causing: °· A mass or lump. A needle biopsy may reveal whether a mass or lump is a cyst, an infection, a benign tumor or cancer.  °· Infection. Tests from a needle biopsy can help doctors determine what germs are causing an infection so that the best medicines can be used for treatment.  °· Inflammation. Looking closely at a needle biopsy sample may reveal what is causing inflammation and what types of cells are involved.  °Your caregiver has now completed a needle biopsy of the lung to help diagnose a medical condition or to rule out a disease or condition. A needle biopsy may also be used to assess the progress of a treatment. °AFTER THE PROCEDURE °Once your caregiver has collected enough cells or tissue for analysis, your needle biopsy procedure is complete. Your biopsy sample is sent to a laboratory to be tested. The results may be available in a day or two. More technical tests may require more time. Ask your caregiver how long you can expect to wait.  °Your health care team may apply a bandage over the areas where the needle was inserted. You may be asked to apply pressure to the bandage for several minutes to ensure there is minimal bleeding. °In most cases, you can leave when your needle biopsy procedure is completed. Do not drive yourself home. Someone else should take you home. Whether you can leave right away or whether you will need to stay for observation depends on how you feel and the exam by your caregiver after the biopsy. In some cases your health care team may want to observe you for a few hours to ensure you do not have complications from your biopsy. If you received an IV sedative or  general anesthetic, you will be taken to a comfortable place to relax while the medication wears off. °Expect to take it easy for the rest of the day. Protect the area where you received the needle biopsy by keeping the bandage in place for as long as instructed. You may feel some mild pain or discomfort in the area, but this should stop in a day or two. Only take over-the-counter or prescription medicines for pain, discomfort, or fever as directed by your caregiver. °SEEK MEDICAL CARE IF:  °· You have pain at the biopsy site that worsens or is not helped by medicines.  °· You have swelling at the needle biopsy site.  °· You have drainage from the biopsy site.  °· You have new or unusual pain in your back or at the top of one or both shoulders.  °SEEK IMMEDIATE MEDICAL CARE IF:  °· You have a fever.  °· You develop lightheadedness or fainting.  °· You have chest pain or shortness of breath.  °· You have bleeding that does not stop with pressure or a bandage.  °· You have weakness or numbness in your legs.  °Document Released: 11/29/2006 Document Revised: 01/21/2011 Document Reviewed: 11/29/2006 °Exit[employer]® Patient Information ©2012 [employer]. °

## 2011-04-20 NOTE — Procedures (Signed)
CT core bx RLL lung nodule 18g x3 Regional alveolar hemorrhage. No ptx.  Pt asx. No blood loss. See complete dictation in [facility]. Will plan cxr in 1 hr., observe.

## 2011-04-20 NOTE — H&P (Signed)
Jeffery [name] is an 51 y.o. male.   Chief Complaint: Rt lung mass; hx rectal Ca HPI: scheduled for R lung mass biopsy in IR  Past Medical History  Diagnosis Date  . No pertinent past medical history   . Cancer     rectal     Past Surgical History  Procedure Date  . Colon surgery 12/11    sbo  . Colon surgery 8/12    colon resection-iliostomy  . Colon surgery 2012    iliostomy takedown  . Multiple tooth extractions     9/11  . Portacath placement 04/12/2011    Procedure: INSERTION PORT-A-CATH;  Surgeon: Almond Lint, MD;  Location: [facility];  Service: General;  Laterality: N/A;  port a cath insertion    No family history on file. Social History:  reports that he has been smoking.  He does not have any smokeless tobacco history on file. He reports that he drinks alcohol. He reports that he does not use illicit drugs.  Allergies: No Known Allergies  Medications Prior to Admission  Medication Sig Dispense Refill  . acetaminophen (TYLENOL) 500 MG tablet Take 1,000 mg by mouth every 6 (six) hours as needed.      . lidocaine-prilocaine (EMLA) cream Apply topically as needed. Apply to [facility] site 1-2 hours prior to stick and cover with plastic wrap  30 g  prn  . oxyCODONE (OXY IR/ROXICODONE) 5 MG immediate release tablet Take 1-2 tablets (5-10 mg total) by mouth every 4 (four) hours as needed for pain.  50 tablet  0  . prochlorperazine (COMPAZINE) 10 MG tablet Take 1 tablet (10 mg total) by mouth every 6 (six) hours as needed (nausea).  60 tablet  1   Medications Prior to Admission  Medication Dose Route Frequency Provider Last Rate Last Dose  . 0.9 %  sodium chloride infusion   Intravenous Once Robet Leu, PA        No results found for this or any previous visit (from the past 48 hour(s)). No results found.  Review of Systems  Constitutional: Negative for fever.  Respiratory: Negative for cough.   Cardiovascular: Negative for chest pain.  Gastrointestinal:  Negative for nausea and vomiting.  Neurological: Negative for headaches.    Blood pressure 151/98, pulse 90, temperature 97.9 F (36.6 C), temperature source Oral, resp. rate 16, SpO2 99.00%. Physical Exam  Constitutional: He is oriented to person, place, and time. He appears well-developed and well-nourished.  HENT:  Head: Normocephalic.  Eyes: EOM are normal.  Neck: Normal range of motion.  Cardiovascular: Normal rate, regular rhythm and normal heart sounds.   No murmur heard. Respiratory: Effort normal and breath sounds normal. He has no wheezes.  GI: Soft. Bowel sounds are normal. There is no tenderness.  Musculoskeletal: Normal range of motion.  Neurological: He is alert and oriented to person, place, and time.  Skin: Skin is warm and dry.     Assessment/Plan Hx rectal Ca; Rt lung nodules. Scheduled for Rt lung biopsy today Pt aware of procedure benefits and risks and agreeable to proceed. Consent signed.  Sreenidhi Ganson A 04/20/2011, 8:17 AM

## 2011-04-21 ENCOUNTER — Telehealth: Payer: Self-pay | Admitting: Oncology

## 2011-04-21 NOTE — Telephone Encounter (Signed)
pt called and needs to r/s 3/7 tx to 3/14,ok per susan r.  pt aware of new appt time   aom

## 2011-04-22 ENCOUNTER — Ambulatory Visit: Payer: Self-pay

## 2011-04-26 ENCOUNTER — Other Ambulatory Visit (HOSPITAL_COMMUNITY): Payer: Medicaid Other

## 2011-04-26 ENCOUNTER — Encounter (INDEPENDENT_AMBULATORY_CARE_PROVIDER_SITE_OTHER): Payer: Self-pay | Admitting: General Surgery

## 2011-04-26 ENCOUNTER — Ambulatory Visit (INDEPENDENT_AMBULATORY_CARE_PROVIDER_SITE_OTHER): Payer: Medicaid Other | Admitting: General Surgery

## 2011-04-26 VITALS — BP 142/92 | HR 80 | Temp 99.2°F | Resp 16 | Ht 64.0 in | Wt 145.0 lb

## 2011-04-26 DIAGNOSIS — C20 Malignant neoplasm of rectum: Secondary | ICD-10-CM

## 2011-04-26 NOTE — Patient Instructions (Signed)
Follow up in 6 months. Get chemo per Dr. Truett Perna.

## 2011-04-26 NOTE — Progress Notes (Signed)
HISTORY: Pt s/p port placement for recurrent metastatic rectal cancer.  He is doing well.  He has no soreness from the port placement.  He denies pain.  He is quitting smoking.      EXAM: General:  Alert and oriented Incision:  Well healed port a cath incision R sided ileostomy incision with scab that keeps scabbing over   PATHOLOGY: Lung nodule adeno consistent with colorectal.   ASSESSMENT AND PLAN:   ADENOCARCINOMA, RECTUM Starting chemo this week for recurrent metastatic rectal cancer.    Has probable stitch granuloma in ileostomy takedown site. Would leave it alone at this point.  Follow up in 6 months.      Discussed smoking cessation.   Maudry Diego, MD Surgical Oncology, General & Endocrine Surgery [facility]  Melvia Heaps, MD, MD, Carmie Kanner, MD

## 2011-04-26 NOTE — Assessment & Plan Note (Signed)
Starting chemo this week for recurrent metastatic rectal cancer.    Has probable stitch granuloma in ileostomy takedown site. Would leave it alone at this point.  Follow up in 6 months.

## 2011-04-28 ENCOUNTER — Other Ambulatory Visit: Payer: Self-pay | Admitting: Oncology

## 2011-04-29 ENCOUNTER — Ambulatory Visit (HOSPITAL_BASED_OUTPATIENT_CLINIC_OR_DEPARTMENT_OTHER): Payer: Medicaid Other

## 2011-04-29 VITALS — BP 147/91 | HR 95 | Temp 97.3°F

## 2011-04-29 DIAGNOSIS — C20 Malignant neoplasm of rectum: Secondary | ICD-10-CM

## 2011-04-29 DIAGNOSIS — C7800 Secondary malignant neoplasm of unspecified lung: Secondary | ICD-10-CM

## 2011-04-29 DIAGNOSIS — Z5111 Encounter for antineoplastic chemotherapy: Secondary | ICD-10-CM

## 2011-04-29 MED ORDER — LEUCOVORIN CALCIUM INJECTION 350 MG
400.0000 mg/m2 | Freq: Once | INTRAVENOUS | Status: AC
  Administered 2011-04-29: 688 mg via INTRAVENOUS
  Filled 2011-04-29: qty 34.4

## 2011-04-29 MED ORDER — ATROPINE SULFATE 0.4 MG/ML IJ SOLN
0.5000 mg | Freq: Once | INTRAMUSCULAR | Status: AC | PRN
  Administered 2011-04-29: 0.5 mg via INTRAVENOUS
  Filled 2011-04-29: qty 1.25

## 2011-04-29 MED ORDER — FLUOROURACIL CHEMO INJECTION 2.5 GM/50ML
400.0000 mg/m2 | Freq: Once | INTRAVENOUS | Status: AC
  Administered 2011-04-29: 700 mg via INTRAVENOUS
  Filled 2011-04-29: qty 14

## 2011-04-29 MED ORDER — SODIUM CHLORIDE 0.9 % IV SOLN
5.0000 mg/kg | Freq: Once | INTRAVENOUS | Status: AC
  Administered 2011-04-29: 325 mg via INTRAVENOUS
  Filled 2011-04-29: qty 13

## 2011-04-29 MED ORDER — SODIUM CHLORIDE 0.9 % IV SOLN
Freq: Once | INTRAVENOUS | Status: AC
  Administered 2011-04-29: 11:00:00 via INTRAVENOUS

## 2011-04-29 MED ORDER — FLUOROURACIL CHEMO INJECTION 5 GM/100ML
2400.0000 mg/m2 | INTRAVENOUS | Status: DC
  Administered 2011-04-29: 4150 mg via INTRAVENOUS
  Filled 2011-04-29: qty 83

## 2011-04-29 MED ORDER — DEXAMETHASONE SODIUM PHOSPHATE 4 MG/ML IJ SOLN
20.0000 mg | Freq: Once | INTRAMUSCULAR | Status: AC
  Administered 2011-04-29: 20 mg via INTRAVENOUS

## 2011-04-29 MED ORDER — IRINOTECAN HCL CHEMO INJECTION 100 MG/5ML
180.0000 mg/m2 | Freq: Once | INTRAVENOUS | Status: AC
  Administered 2011-04-29: 310 mg via INTRAVENOUS
  Filled 2011-04-29: qty 15.5

## 2011-04-29 MED ORDER — ONDANSETRON 16 MG/50ML IVPB (CHCC)
16.0000 mg | Freq: Once | INTRAVENOUS | Status: AC
  Administered 2011-04-29: 16 mg via INTRAVENOUS

## 2011-04-29 NOTE — Patient Instructions (Signed)
 Cancer Center Discharge Instructions for Patients Receiving Chemotherapy  Today you received the following chemotherapy agents FOLFIRI, AVASTIN  To help prevent nausea and vomiting after your treatment, we encourage you to take your nausea medication   If you develop nausea and vomiting that is not controlled by your nausea medication, call the clinic. If it is after clinic hours your family physician or the after hours number for the clinic or go to the Emergency Department.   BELOW ARE SYMPTOMS THAT SHOULD BE REPORTED IMMEDIATELY:  *FEVER GREATER THAN 100.5 F  *CHILLS WITH OR WITHOUT FEVER  NAUSEA AND VOMITING THAT IS NOT CONTROLLED WITH YOUR NAUSEA MEDICATION  *UNUSUAL SHORTNESS OF BREATH  *UNUSUAL BRUISING OR BLEEDING  TENDERNESS IN MOUTH AND THROAT WITH OR WITHOUT PRESENCE OF ULCERS  *URINARY PROBLEMS  *BOWEL PROBLEMS  UNUSUAL RASH Items with * indicate a potential emergency and should be followed up as soon as possible.  One of the nurses will contact you 24 hours after your treatment. Please let the nurse know about any problems that you may have experienced. Feel free to call the clinic you have any questions or concerns. The clinic phone number is [phone].   I have been informed and understand all the instructions given to me. I know to contact the clinic, my physician, or go to the Emergency Department if any problems should occur. I do not have any questions at this time, but understand that I may call the clinic during office hours   should I have any questions or need assistance in obtaining follow up care.    __________________________________________  _____________  __________ Signature of Patient or Authorized Representative            Date                   Time    __________________________________________ Nurse's Signature

## 2011-04-29 NOTE — Progress Notes (Signed)
Teaching provided re: home infusion pump.  Spill kit provided.  Pt aware of appt for Saturday.  Will arrive at 1130 "or a few minutes later."   Aware of how to reach RN or on call MD after hours.  dph

## 2011-04-29 NOTE — Letter (Signed)
April 16, 2011     NAME:  ALBARAA, SWINGLE MRN:  [id number] DOB:  April 10, 1960  To Whom It May Concern,  Rudy Jew. Weygandt (date of birth Apr 25, 1960) is a patient at the [facility].  Mr. Umstead has been diagnosed with metastatic rectal cancer.  He will be starting treatment with systemic chemotherapy in the near future.  Mr. Matsuura should qualify for complete and indefinite medical disability based on this diagnosis.  Please contact me with any question regarding his diagnosis and treatment plan.  Sincerely,    Ladene Artist, M.D.  GBS/MEDQ  D:  04/16/2011  T:  04/16/2011  Job:  [id number]

## 2011-04-29 NOTE — Progress Notes (Signed)
Ok to tx per dr Truett Perna with avastin re;increase bp

## 2011-04-30 ENCOUNTER — Telehealth: Payer: Self-pay | Admitting: *Deleted

## 2011-04-30 NOTE — Telephone Encounter (Signed)
REQUESTED A RETURN PHONE CALL WITH AN UPDATE OF PT.'S STATUS AFTER CHEMOTHERAPY.

## 2011-05-01 ENCOUNTER — Ambulatory Visit (HOSPITAL_BASED_OUTPATIENT_CLINIC_OR_DEPARTMENT_OTHER): Payer: Medicaid Other

## 2011-05-01 ENCOUNTER — Other Ambulatory Visit: Payer: Self-pay | Admitting: Emergency Medicine

## 2011-05-01 VITALS — BP 149/93 | HR 103 | Temp 98.9°F

## 2011-05-01 DIAGNOSIS — Z452 Encounter for adjustment and management of vascular access device: Secondary | ICD-10-CM

## 2011-05-01 DIAGNOSIS — C20 Malignant neoplasm of rectum: Secondary | ICD-10-CM

## 2011-05-01 MED ORDER — SODIUM CHLORIDE 0.9 % IJ SOLN
10.0000 mL | INTRAMUSCULAR | Status: DC | PRN
  Administered 2011-05-01: 10 mL
  Filled 2011-05-01: qty 10

## 2011-05-01 MED ORDER — HEPARIN SOD (PORK) LOCK FLUSH 100 UNIT/ML IV SOLN
500.0000 [IU] | Freq: Once | INTRAVENOUS | Status: AC | PRN
  Administered 2011-05-01: 500 [IU]
  Filled 2011-05-01: qty 5

## 2011-05-03 ENCOUNTER — Ambulatory Visit: Payer: Medicaid Other | Admitting: Nurse Practitioner

## 2011-05-03 ENCOUNTER — Other Ambulatory Visit: Payer: Medicaid Other | Admitting: Lab

## 2011-05-03 NOTE — Telephone Encounter (Signed)
NO RETURN CALL FROM PT.

## 2011-05-04 ENCOUNTER — Ambulatory Visit: Payer: Medicaid Other

## 2011-05-04 ENCOUNTER — Ambulatory Visit: Payer: Medicaid Other | Admitting: Nurse Practitioner

## 2011-05-05 ENCOUNTER — Ambulatory Visit: Payer: Medicaid Other

## 2011-05-05 ENCOUNTER — Ambulatory Visit: Payer: Medicaid Other | Admitting: Nurse Practitioner

## 2011-05-05 ENCOUNTER — Other Ambulatory Visit: Payer: Medicaid Other

## 2011-05-13 ENCOUNTER — Ambulatory Visit: Payer: Medicaid Other | Admitting: Lab

## 2011-05-13 ENCOUNTER — Other Ambulatory Visit: Payer: Self-pay | Admitting: *Deleted

## 2011-05-13 ENCOUNTER — Other Ambulatory Visit: Payer: Self-pay | Admitting: Oncology

## 2011-05-13 ENCOUNTER — Ambulatory Visit (HOSPITAL_BASED_OUTPATIENT_CLINIC_OR_DEPARTMENT_OTHER): Payer: Medicaid Other

## 2011-05-13 VITALS — BP 154/93 | HR 87 | Temp 97.8°F

## 2011-05-13 DIAGNOSIS — C20 Malignant neoplasm of rectum: Secondary | ICD-10-CM

## 2011-05-13 DIAGNOSIS — Z5112 Encounter for antineoplastic immunotherapy: Secondary | ICD-10-CM

## 2011-05-13 DIAGNOSIS — Z5111 Encounter for antineoplastic chemotherapy: Secondary | ICD-10-CM

## 2011-05-13 LAB — CBC WITH DIFFERENTIAL/PLATELET
Eosinophils Absolute: 0 10*3/uL (ref 0.0–0.5)
MONO#: 0.4 10*3/uL (ref 0.1–0.9)
MONO%: 7.2 % (ref 0.0–14.0)
NEUT#: 4.5 10*3/uL (ref 1.5–6.5)
RBC: 4.17 10*6/uL — ABNORMAL LOW (ref 4.20–5.82)
RDW: 13 % (ref 11.0–14.6)
WBC: 5.9 10*3/uL (ref 4.0–10.3)
nRBC: 0 % (ref 0–0)

## 2011-05-13 LAB — COMPREHENSIVE METABOLIC PANEL
ALT: 21 U/L (ref 0–53)
AST: 26 U/L (ref 0–37)
CO2: 30 mEq/L (ref 19–32)
Chloride: 103 mEq/L (ref 96–112)
Sodium: 140 mEq/L (ref 135–145)
Total Bilirubin: 0.7 mg/dL (ref 0.3–1.2)
Total Protein: 7.6 g/dL (ref 6.0–8.3)

## 2011-05-13 LAB — UA PROTEIN, DIPSTICK - CHCC: Protein, ur: NEGATIVE mg/dL

## 2011-05-13 MED ORDER — SODIUM CHLORIDE 0.9 % IV SOLN
2400.0000 mg/m2 | INTRAVENOUS | Status: DC
  Administered 2011-05-13: 4150 mg via INTRAVENOUS
  Filled 2011-05-13: qty 83

## 2011-05-13 MED ORDER — SODIUM CHLORIDE 0.9 % IV SOLN
5.0000 mg/kg | Freq: Once | INTRAVENOUS | Status: AC
  Administered 2011-05-13: 325 mg via INTRAVENOUS
  Filled 2011-05-13: qty 13

## 2011-05-13 MED ORDER — ONDANSETRON 16 MG/50ML IVPB (CHCC)
16.0000 mg | Freq: Once | INTRAVENOUS | Status: AC
  Administered 2011-05-13: 16 mg via INTRAVENOUS

## 2011-05-13 MED ORDER — ATROPINE SULFATE 0.4 MG/ML IJ SOLN
0.5000 mg | Freq: Once | INTRAMUSCULAR | Status: DC | PRN
  Filled 2011-05-13: qty 1.25

## 2011-05-13 MED ORDER — FLUOROURACIL CHEMO INJECTION 2.5 GM/50ML
400.0000 mg/m2 | Freq: Once | INTRAVENOUS | Status: AC
  Administered 2011-05-13: 700 mg via INTRAVENOUS
  Filled 2011-05-13: qty 14

## 2011-05-13 MED ORDER — DEXAMETHASONE SODIUM PHOSPHATE 4 MG/ML IJ SOLN
20.0000 mg | Freq: Once | INTRAMUSCULAR | Status: AC
  Administered 2011-05-13: 20 mg via INTRAVENOUS

## 2011-05-13 MED ORDER — SODIUM CHLORIDE 0.9 % IV SOLN
Freq: Once | INTRAVENOUS | Status: AC
  Administered 2011-05-13: 10:00:00 via INTRAVENOUS

## 2011-05-13 MED ORDER — IRINOTECAN HCL CHEMO INJECTION 100 MG/5ML
180.0000 mg/m2 | Freq: Once | INTRAVENOUS | Status: AC
  Administered 2011-05-13: 310 mg via INTRAVENOUS
  Filled 2011-05-13: qty 15.5

## 2011-05-13 MED ORDER — LEUCOVORIN CALCIUM INJECTION 350 MG
400.0000 mg/m2 | Freq: Once | INTRAVENOUS | Status: AC
  Administered 2011-05-13: 688 mg via INTRAVENOUS
  Filled 2011-05-13: qty 34.4

## 2011-05-14 ENCOUNTER — Telehealth: Payer: Self-pay | Admitting: Oncology

## 2011-05-14 NOTE — Telephone Encounter (Signed)
S/w pt today to let him know tx appts have been added. Pt will get new schedule when he comes in for pump d/c tomorrow.

## 2011-05-15 ENCOUNTER — Ambulatory Visit (HOSPITAL_BASED_OUTPATIENT_CLINIC_OR_DEPARTMENT_OTHER): Payer: Medicaid Other

## 2011-05-15 VITALS — BP 145/78 | HR 94 | Temp 97.8°F

## 2011-05-15 DIAGNOSIS — Z452 Encounter for adjustment and management of vascular access device: Secondary | ICD-10-CM

## 2011-05-15 DIAGNOSIS — C20 Malignant neoplasm of rectum: Secondary | ICD-10-CM

## 2011-05-15 MED ORDER — HEPARIN SOD (PORK) LOCK FLUSH 100 UNIT/ML IV SOLN
500.0000 [IU] | Freq: Once | INTRAVENOUS | Status: AC | PRN
  Administered 2011-05-15: 500 [IU]
  Filled 2011-05-15: qty 5

## 2011-05-15 MED ORDER — SODIUM CHLORIDE 0.9 % IJ SOLN
10.0000 mL | INTRAMUSCULAR | Status: DC | PRN
  Administered 2011-05-15: 10 mL
  Filled 2011-05-15: qty 10

## 2011-05-17 ENCOUNTER — Other Ambulatory Visit: Payer: Self-pay | Admitting: Certified Registered Nurse Anesthetist

## 2011-05-18 ENCOUNTER — Ambulatory Visit: Payer: Medicaid Other

## 2011-05-18 ENCOUNTER — Ambulatory Visit: Payer: Medicaid Other | Admitting: Nurse Practitioner

## 2011-05-18 ENCOUNTER — Other Ambulatory Visit: Payer: Medicaid Other

## 2011-05-26 ENCOUNTER — Ambulatory Visit (HOSPITAL_BASED_OUTPATIENT_CLINIC_OR_DEPARTMENT_OTHER): Payer: Medicaid Other | Admitting: Nurse Practitioner

## 2011-05-26 ENCOUNTER — Other Ambulatory Visit: Payer: Self-pay | Admitting: Oncology

## 2011-05-26 ENCOUNTER — Other Ambulatory Visit (HOSPITAL_BASED_OUTPATIENT_CLINIC_OR_DEPARTMENT_OTHER): Payer: Medicaid Other | Admitting: Lab

## 2011-05-26 VITALS — BP 150/94 | HR 84 | Temp 98.3°F | Ht 64.0 in | Wt 146.9 lb

## 2011-05-26 DIAGNOSIS — R030 Elevated blood-pressure reading, without diagnosis of hypertension: Secondary | ICD-10-CM

## 2011-05-26 DIAGNOSIS — C20 Malignant neoplasm of rectum: Secondary | ICD-10-CM

## 2011-05-26 DIAGNOSIS — C19 Malignant neoplasm of rectosigmoid junction: Secondary | ICD-10-CM

## 2011-05-26 LAB — CBC WITH DIFFERENTIAL/PLATELET
BASO%: 0.3 % (ref 0.0–2.0)
EOS%: 0.7 % (ref 0.0–7.0)
HCT: 40.6 % (ref 38.4–49.9)
LYMPH%: 12.3 % — ABNORMAL LOW (ref 14.0–49.0)
MCH: 33.7 pg — ABNORMAL HIGH (ref 27.2–33.4)
MCHC: 34.2 g/dL (ref 32.0–36.0)
MCV: 98.6 fL — ABNORMAL HIGH (ref 79.3–98.0)
MONO%: 12.9 % (ref 0.0–14.0)
NEUT%: 73.8 % (ref 39.0–75.0)
lymph#: 0.8 10*3/uL — ABNORMAL LOW (ref 0.9–3.3)

## 2011-05-26 LAB — COMPREHENSIVE METABOLIC PANEL
AST: 22 U/L (ref 0–37)
Alkaline Phosphatase: 89 U/L (ref 39–117)
BUN: 13 mg/dL (ref 6–23)
Creatinine, Ser: 1.13 mg/dL (ref 0.50–1.35)
Glucose, Bld: 97 mg/dL (ref 70–99)
Potassium: 3.6 mEq/L (ref 3.5–5.3)
Total Bilirubin: 0.7 mg/dL (ref 0.3–1.2)

## 2011-05-26 NOTE — Progress Notes (Signed)
OFFICE PROGRESS NOTE  Interval history:  Jeffery [name] returns as scheduled. He completed cycle 2 FOLFIRI/Avastin 05/13/2011. He denies nausea/vomiting. No mouth sores. No diarrhea. He denies abdominal pain. No bleeding. No shortness of breath or chest pain. He denies leg swelling or calf pain.   Objective: Blood pressure 150/94, pulse 84, temperature 98.3 F (36.8 C), temperature source Oral, height 5\' 4"  (1.626 m), weight 146 lb 14.4 oz (66.633 kg).  Oropharynx is without thrush or ulceration. Lungs are clear. Regular cardiac rhythm. Port-A-Cath site without erythema. Abdomen is soft and nontender. No hepatomegaly. Extremities are without edema. Calves are soft and nontender.  Lab Results: Lab Results  Component Value Date   WBC 6.6 05/26/2011   HGB 13.9 05/26/2011   HCT 40.6 05/26/2011   MCV 98.6* 05/26/2011   PLT 211 05/26/2011    Chemistry:    Chemistry      Component Value Date/Time   NA 138 05/26/2011 0915   K 3.6 05/26/2011 0915   CL 102 05/26/2011 0915   CO2 27 05/26/2011 0915   BUN 13 05/26/2011 0915   CREATININE 1.13 05/26/2011 0915      Component Value Date/Time   CALCIUM 9.0 05/26/2011 0915   ALKPHOS 89 05/26/2011 0915   AST 22 05/26/2011 0915   ALT 18 05/26/2011 0915   BILITOT 0.7 05/26/2011 0915       Studies/Results: No results found.  Medications: I have reviewed the patient's current medications.  Assessment/Plan:  1. Clinical stage III (uT3 uN1) rectal cancer status post pelvic radiation, August 24 through November 18, 2009. 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 There was no lymph node involvement (pT3 pN0). He began adjuvant CAPOX chemotherapy March 11, 2010. He completed the sixth and final cycle of CAPOX  06/25/2010. CT of the abdomen and pelvis 03/11/2011 confirmed multiple nodules at the lung bases consistent with metastatic disease. 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 He completed cycle 1 of FOLFIRI/Avastin 04/29/2011. He completed cycle 2 05/13/2011. 2. Strangulated peristomal hernia and small bowel obstruction status post reduction of peristomal hernia, omentectomy, exploratory laparotomy, small bowel resection x2 on January 28, 2010.  3. History of rectal bleeding secondary to number 1.  4. History of an elevated CEA.  5. History of a left adrenal nodule.  6. Weight loss, improved. His weight has increased since the summer of 2012. 7. Right knee pain/effusion status post evaluation at [facility].  8. Recent abdominal pain, nausea and constipation-the nausea and constipation have resolved. He continues to have intermittent abdominal pain. 9. CT abdomen/pelvis 03/11/2011 with multiple nodules at the lung bases consistent with metastatic involvement of the lungs; prominent presacral soft tissue; air bubble within the presacral soft tissue appeared to be extraluminal and breakdown at the anastomotic site could not be excluded. No discrete abscess was seen. 10. Elevated blood pressure. Question effect of Avastin. We will begin an antihypertensive medication if the blood pressure remains elevated.  Disposition-[name] appears stable. Plan to proceed with cycle 3 of FOLFIRI/Avastin as scheduled on 05/27/2011. He will return for a followup visit and cycle 4 FOLFIRI/Avastin on 06/10/2011. We will repeat the CEA on 06/10/2011. He will contact the office prior to his next visit with any problems.  Plan reviewed with Dr. Truett Perna.  Jeffery [name] Jeffery [name],  Jeffery [name] ANP/GNP-BC

## 2011-05-27 ENCOUNTER — Ambulatory Visit (HOSPITAL_BASED_OUTPATIENT_CLINIC_OR_DEPARTMENT_OTHER): Payer: Medicaid Other

## 2011-05-27 ENCOUNTER — Telehealth: Payer: Self-pay | Admitting: Oncology

## 2011-05-27 VITALS — BP 158/88 | HR 102 | Temp 98.8°F

## 2011-05-27 DIAGNOSIS — Z5111 Encounter for antineoplastic chemotherapy: Secondary | ICD-10-CM

## 2011-05-27 DIAGNOSIS — C20 Malignant neoplasm of rectum: Secondary | ICD-10-CM

## 2011-05-27 MED ORDER — SODIUM CHLORIDE 0.9 % IV SOLN
2400.0000 mg/m2 | INTRAVENOUS | Status: DC
  Administered 2011-05-27: 4150 mg via INTRAVENOUS
  Filled 2011-05-27: qty 83

## 2011-05-27 MED ORDER — IRINOTECAN HCL CHEMO INJECTION 100 MG/5ML
180.0000 mg/m2 | Freq: Once | INTRAVENOUS | Status: AC
  Administered 2011-05-27: 310 mg via INTRAVENOUS
  Filled 2011-05-27: qty 15.5

## 2011-05-27 MED ORDER — SODIUM CHLORIDE 0.9 % IV SOLN
Freq: Once | INTRAVENOUS | Status: AC
  Administered 2011-05-27: 13:00:00 via INTRAVENOUS

## 2011-05-27 MED ORDER — SODIUM CHLORIDE 0.9 % IV SOLN
5.0000 mg/kg | Freq: Once | INTRAVENOUS | Status: AC
  Administered 2011-05-27: 325 mg via INTRAVENOUS
  Filled 2011-05-27: qty 13

## 2011-05-27 MED ORDER — LEUCOVORIN CALCIUM INJECTION 350 MG
400.0000 mg/m2 | Freq: Once | INTRAVENOUS | Status: AC
  Administered 2011-05-27: 688 mg via INTRAVENOUS
  Filled 2011-05-27: qty 34.4

## 2011-05-27 MED ORDER — FLUOROURACIL CHEMO INJECTION 2.5 GM/50ML
400.0000 mg/m2 | Freq: Once | INTRAVENOUS | Status: AC
  Administered 2011-05-27: 700 mg via INTRAVENOUS
  Filled 2011-05-27: qty 14

## 2011-05-27 MED ORDER — SODIUM CHLORIDE 0.9 % IJ SOLN
10.0000 mL | INTRAMUSCULAR | Status: DC | PRN
  Filled 2011-05-27: qty 10

## 2011-05-27 MED ORDER — DEXAMETHASONE SODIUM PHOSPHATE 4 MG/ML IJ SOLN
20.0000 mg | Freq: Once | INTRAMUSCULAR | Status: AC
  Administered 2011-05-27: 20 mg via INTRAVENOUS

## 2011-05-27 MED ORDER — ATROPINE SULFATE 0.4 MG/ML IJ SOLN
0.5000 mg | Freq: Once | INTRAMUSCULAR | Status: DC | PRN
  Filled 2011-05-27: qty 1.25

## 2011-05-27 MED ORDER — ONDANSETRON 16 MG/50ML IVPB (CHCC)
16.0000 mg | Freq: Once | INTRAVENOUS | Status: AC
  Administered 2011-05-27: 16 mg via INTRAVENOUS

## 2011-05-27 NOTE — Telephone Encounter (Signed)
Gv pt appt for april-may2013 

## 2011-05-27 NOTE — Patient Instructions (Signed)
[facility] Discharge Instructions for Patients Receiving Chemotherapy  Today you received the following chemotherapy agents : 5 FU/Leucovorin, Camptosar, and Avastin  To help prevent nausea and vomiting after your treatment, we encourage you to take your nausea medication : Compazine 10 mg every 6 hours as needed Begin taking it at 1st sign of nausea and take it as often as prescribed.   If you develop nausea and vomiting that is not controlled by your nausea medication, call the clinic. If it is after clinic hours your family physician or the after hours number for the clinic or go to the Emergency Department.   BELOW ARE SYMPTOMS THAT SHOULD BE REPORTED IMMEDIATELY:  *FEVER GREATER THAN 100.5 F  *CHILLS WITH OR WITHOUT FEVER  NAUSEA AND VOMITING THAT IS NOT CONTROLLED WITH YOUR NAUSEA MEDICATION  *UNUSUAL SHORTNESS OF BREATH  *UNUSUAL BRUISING OR BLEEDING  TENDERNESS IN MOUTH AND THROAT WITH OR WITHOUT PRESENCE OF ULCERS  *URINARY PROBLEMS  *BOWEL PROBLEMS  UNUSUAL RASH Items with * indicate a potential emergency and should be followed up as soon as possible.   Feel free to call the clinic you have any questions or concerns. The clinic phone number is [phone].   I have been informed and understand all the instructions given to me. I know to contact the clinic, my physician, or go to the Emergency Department if any problems should occur. I do not have any questions at this time, but understand that I may call the clinic during office hours   should I have any questions or need assistance in obtaining follow up care.    __________________________________________  _____________  __________ Signature of Patient or Authorized Representative            Date                   Time    __________________________________________ Nurse's Signature

## 2011-05-29 ENCOUNTER — Ambulatory Visit (HOSPITAL_BASED_OUTPATIENT_CLINIC_OR_DEPARTMENT_OTHER): Payer: Medicaid Other

## 2011-05-29 VITALS — BP 156/96 | HR 101 | Temp 98.2°F

## 2011-05-29 DIAGNOSIS — C20 Malignant neoplasm of rectum: Secondary | ICD-10-CM

## 2011-05-29 MED ORDER — SODIUM CHLORIDE 0.9 % IJ SOLN
10.0000 mL | INTRAMUSCULAR | Status: DC | PRN
  Administered 2011-05-29: 10 mL
  Filled 2011-05-29: qty 10

## 2011-05-29 MED ORDER — HEPARIN SOD (PORK) LOCK FLUSH 100 UNIT/ML IV SOLN
500.0000 [IU] | Freq: Once | INTRAVENOUS | Status: AC | PRN
  Administered 2011-05-29: 500 [IU]
  Filled 2011-05-29: qty 5

## 2011-05-31 ENCOUNTER — Other Ambulatory Visit: Payer: Self-pay | Admitting: Certified Registered Nurse Anesthetist

## 2011-06-09 ENCOUNTER — Other Ambulatory Visit: Payer: Self-pay | Admitting: Oncology

## 2011-06-10 ENCOUNTER — Other Ambulatory Visit (HOSPITAL_BASED_OUTPATIENT_CLINIC_OR_DEPARTMENT_OTHER): Payer: Medicaid Other | Admitting: Lab

## 2011-06-10 ENCOUNTER — Other Ambulatory Visit: Payer: Self-pay | Admitting: *Deleted

## 2011-06-10 ENCOUNTER — Telehealth: Payer: Self-pay | Admitting: Oncology

## 2011-06-10 ENCOUNTER — Ambulatory Visit (HOSPITAL_BASED_OUTPATIENT_CLINIC_OR_DEPARTMENT_OTHER): Payer: Medicaid Other | Admitting: Oncology

## 2011-06-10 ENCOUNTER — Ambulatory Visit (HOSPITAL_BASED_OUTPATIENT_CLINIC_OR_DEPARTMENT_OTHER): Payer: Medicaid Other

## 2011-06-10 VITALS — BP 146/101 | HR 108 | Temp 97.9°F | Ht 64.0 in | Wt 144.8 lb

## 2011-06-10 VITALS — BP 144/92

## 2011-06-10 DIAGNOSIS — R198 Other specified symptoms and signs involving the digestive system and abdomen: Secondary | ICD-10-CM

## 2011-06-10 DIAGNOSIS — Z5111 Encounter for antineoplastic chemotherapy: Secondary | ICD-10-CM

## 2011-06-10 DIAGNOSIS — C20 Malignant neoplasm of rectum: Secondary | ICD-10-CM

## 2011-06-10 DIAGNOSIS — C7800 Secondary malignant neoplasm of unspecified lung: Secondary | ICD-10-CM

## 2011-06-10 DIAGNOSIS — I10 Essential (primary) hypertension: Secondary | ICD-10-CM

## 2011-06-10 DIAGNOSIS — C19 Malignant neoplasm of rectosigmoid junction: Secondary | ICD-10-CM

## 2011-06-10 LAB — CBC WITH DIFFERENTIAL/PLATELET
BASO%: 1 % (ref 0.0–2.0)
EOS%: 0.7 % (ref 0.0–7.0)
MCH: 33.7 pg — ABNORMAL HIGH (ref 27.2–33.4)
MCV: 97.7 fL (ref 79.3–98.0)
MONO%: 11.9 % (ref 0.0–14.0)
NEUT#: 3.9 10*3/uL (ref 1.5–6.5)
RBC: 3.72 10*6/uL — ABNORMAL LOW (ref 4.20–5.82)
RDW: 14.3 % (ref 11.0–14.6)
lymph#: 0.8 10*3/uL — ABNORMAL LOW (ref 0.9–3.3)
nRBC: 0 % (ref 0–0)

## 2011-06-10 LAB — COMPREHENSIVE METABOLIC PANEL
ALT: 13 U/L (ref 0–53)
AST: 21 U/L (ref 0–37)
Alkaline Phosphatase: 74 U/L (ref 39–117)
Sodium: 138 mEq/L (ref 135–145)
Total Bilirubin: 0.5 mg/dL (ref 0.3–1.2)
Total Protein: 7.6 g/dL (ref 6.0–8.3)

## 2011-06-10 LAB — CEA: CEA: 8 ng/mL — ABNORMAL HIGH (ref 0.0–5.0)

## 2011-06-10 MED ORDER — ONDANSETRON 16 MG/50ML IVPB (CHCC)
16.0000 mg | Freq: Once | INTRAVENOUS | Status: AC
  Administered 2011-06-10: 16 mg via INTRAVENOUS

## 2011-06-10 MED ORDER — DEXTROSE 5 % IV SOLN
400.0000 mg/m2 | Freq: Once | INTRAVENOUS | Status: AC
  Administered 2011-06-10: 688 mg via INTRAVENOUS
  Filled 2011-06-10: qty 34.4

## 2011-06-10 MED ORDER — AMLODIPINE BESYLATE 5 MG PO TABS: 5.0000 mg | ORAL_TABLET | Freq: Every day | ORAL | Status: DC

## 2011-06-10 MED ORDER — IRINOTECAN HCL CHEMO INJECTION 100 MG/5ML
180.0000 mg/m2 | Freq: Once | INTRAVENOUS | Status: AC
  Administered 2011-06-10: 310 mg via INTRAVENOUS
  Filled 2011-06-10: qty 15.5

## 2011-06-10 MED ORDER — SODIUM CHLORIDE 0.9 % IV SOLN
Freq: Once | INTRAVENOUS | Status: AC
  Administered 2011-06-10: 12:00:00 via INTRAVENOUS

## 2011-06-10 MED ORDER — DEXAMETHASONE SODIUM PHOSPHATE 4 MG/ML IJ SOLN
20.0000 mg | Freq: Once | INTRAMUSCULAR | Status: AC
  Administered 2011-06-10: 20 mg via INTRAVENOUS

## 2011-06-10 MED ORDER — FLUOROURACIL CHEMO INJECTION 2.5 GM/50ML
400.0000 mg/m2 | Freq: Once | INTRAVENOUS | Status: AC
  Administered 2011-06-10: 700 mg via INTRAVENOUS
  Filled 2011-06-10: qty 14

## 2011-06-10 MED ORDER — SODIUM CHLORIDE 0.9 % IV SOLN
5.0000 mg/kg | Freq: Once | INTRAVENOUS | Status: AC
  Administered 2011-06-10: 325 mg via INTRAVENOUS
  Filled 2011-06-10: qty 13

## 2011-06-10 MED ORDER — SODIUM CHLORIDE 0.9 % IJ SOLN
10.0000 mL | INTRAMUSCULAR | Status: DC | PRN
  Filled 2011-06-10: qty 10

## 2011-06-10 MED ORDER — SODIUM CHLORIDE 0.9 % IV SOLN
2400.0000 mg/m2 | INTRAVENOUS | Status: DC
  Administered 2011-06-10: 4150 mg via INTRAVENOUS
  Filled 2011-06-10: qty 83

## 2011-06-10 NOTE — Patient Instructions (Addendum)
[facility] Discharge Instructions for Patients Receiving Chemotherapy  Today you received the following chemotherapy agents : Avastin, Camptosar, 5 FU, Leucovorin To help prevent nausea and vomiting after your treatment, we encourage you to take your nausea medication : Compazine 10 mg by mouth every 6 hours if needed Begin taking it at 1st sign of nausea and take it as often as prescribed.   If you develop nausea and vomiting that is not controlled by your nausea medication, call the clinic. If it is after clinic hours your family physician or the after hours number for the clinic or go to the Emergency Department.  *NEW MEDICATION: NORVASC 5 mg daily for high blood pressure   BELOW ARE SYMPTOMS THAT SHOULD BE REPORTED IMMEDIATELY:  *FEVER GREATER THAN 100.5 F  *CHILLS WITH OR WITHOUT FEVER  NAUSEA AND VOMITING THAT IS NOT CONTROLLED WITH YOUR NAUSEA MEDICATION  *UNUSUAL SHORTNESS OF BREATH  *UNUSUAL BRUISING OR BLEEDING  TENDERNESS IN MOUTH AND THROAT WITH OR WITHOUT PRESENCE OF ULCERS  *URINARY PROBLEMS  *BOWEL PROBLEMS (Uncontrolled diarrhea)  UNUSUAL RASH Items with * indicate a potential emergency and should be followed up as soon as possible.   Feel free to call the clinic you have any questions or concerns. The clinic phone number is [phone].   I have been informed and understand all the instructions given to me. I know to contact the clinic, my physician, or go to the Emergency Department if any problems should occur. I do not have any questions at this time, but understand that I may call the clinic during office hours   should I have any questions or need assistance in obtaining follow up care.    __________________________________________  _____________  __________ Signature of Patient or Authorized Representative            Date                   Time    __________________________________________ Nurse's Signature

## 2011-06-10 NOTE — Progress Notes (Signed)
OFFICE PROGRESS NOTE   INTERVAL HISTORY:   He returns as scheduled. He completed another cycle of chemotherapy on 05/27/2011. He reports tolerating the chemotherapy well. No mouth sores or nausea. He has up to 20 bowel movements per day. This predated chemotherapy. Imodium has not helped. He develops irritation at the perineum after bowel movements.  Objective:  Vital signs in last 24 hours:  Blood pressure 146/101, pulse 108, temperature 97.9 F (36.6 C), temperature source Oral, height 5\' 4"  (1.626 m), weight 144 lb 12.8 oz (65.681 kg).    HEENT: No thrush or ulcers, hyperpigmentation over the time Resp: Lungs with bronchial sounds at the upper posterior chest bilaterally Cardio: Regular rate and rhythm GI: Abdomen is soft and nontender, no hepatomegaly, no mass Vascular: No leg edema  Skin: Hyperpigmentation and skin thickening at the hands.   Portacath/PICC-without erythema  Lab Results:  Lab Results  Component Value Date   WBC 5.4 06/10/2011   HGB 12.5* 06/10/2011   HCT 36.4* 06/10/2011   MCV 97.7 06/10/2011   PLT 324 06/10/2011   ANC 3.9    Medications: I have reviewed the patient's current medications.  Assessment/Plan: 1. Clinical stage III (uT3 uN1) rectal cancer status post pelvic radiation, August 24 through November 18, 2009. 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 There was no lymph node involvement (pT3 pN0). He began adjuvant CAPOX chemotherapy March 11, 2010. He completed the sixth and final cycle of CAPOX 06/25/2010. CT of the abdomen and pelvis 03/11/2011 confirmed multiple nodules at the lung bases consistent with metastatic disease. Biopsy of a lung nodule on 04/20/2011 showed metastatic adenocarcinoma  consistent with a colorectal primary. He completed cycle 1 of FOLFIRI/Avastin 04/29/2011. He completed cycle 3 05/27/2011. 2. Strangulated peristomal hernia and small bowel obstruction status post reduction of peristomal hernia, omentectomy, exploratory laparotomy, small bowel resection x2 on January 28, 2010.  3. History of rectal bleeding secondary to number 1.  4. History of an elevated CEA.  5. History of a left adrenal nodule.  6. Weight loss, improved. His weight has increased since the summer of 2012. 7. Right knee pain/effusion status post evaluation at [facility].  8. Recent abdominal pain, nausea and constipation-the nausea and constipation have resolved. He continues to have intermittent abdominal pain. 9. CT abdomen/pelvis 03/11/2011 with multiple nodules at the lung bases consistent with metastatic involvement of the lungs; prominent presacral soft tissue; air bubble within the presacral soft tissue appeared to be extraluminal and breakdown at the anastomotic site could not be excluded. No discrete abscess was seen.        10.  Elevated blood pressure. Question effect of Avastin. The blood pressure has been consistently elevated. He will begin an antihypertensive medication        11. Frequent bowel movements-likely related to multiple bowel resections. I will last Dr. [name] for recommendations to slow the transit time.  Disposition:  He appears stable. The plan is to proceed with a fourth cycle of FOLFIRI/Avastin today. He will begin antihypertensive medication. He will return for mouth visit and chemotherapy in 2 weeks.   Thornton Papas, MD  06/10/2011  12:45 PM

## 2011-06-10 NOTE — Progress Notes (Signed)
Provided patient printed information on Norvasc (amlodipine) and that script was sent to his pharmacy.

## 2011-06-10 NOTE — Telephone Encounter (Signed)
Gv pt appt for may2013 

## 2011-06-12 ENCOUNTER — Ambulatory Visit (HOSPITAL_BASED_OUTPATIENT_CLINIC_OR_DEPARTMENT_OTHER): Payer: Medicaid Other

## 2011-06-12 VITALS — BP 135/87 | HR 108 | Temp 97.2°F

## 2011-06-12 DIAGNOSIS — Z452 Encounter for adjustment and management of vascular access device: Secondary | ICD-10-CM

## 2011-06-12 DIAGNOSIS — C20 Malignant neoplasm of rectum: Secondary | ICD-10-CM

## 2011-06-12 MED ORDER — HEPARIN SOD (PORK) LOCK FLUSH 100 UNIT/ML IV SOLN
500.0000 [IU] | Freq: Once | INTRAVENOUS | Status: AC | PRN
  Administered 2011-06-12: 500 [IU]
  Filled 2011-06-12: qty 5

## 2011-06-12 MED ORDER — SODIUM CHLORIDE 0.9 % IJ SOLN
10.0000 mL | INTRAMUSCULAR | Status: DC | PRN
  Administered 2011-06-12: 10 mL
  Filled 2011-06-12: qty 10

## 2011-06-23 ENCOUNTER — Other Ambulatory Visit: Payer: Self-pay | Admitting: Oncology

## 2011-06-24 ENCOUNTER — Telehealth: Payer: Self-pay | Admitting: Oncology

## 2011-06-24 ENCOUNTER — Ambulatory Visit (HOSPITAL_BASED_OUTPATIENT_CLINIC_OR_DEPARTMENT_OTHER): Payer: Medicaid Other

## 2011-06-24 ENCOUNTER — Telehealth: Payer: Self-pay | Admitting: *Deleted

## 2011-06-24 ENCOUNTER — Ambulatory Visit (HOSPITAL_BASED_OUTPATIENT_CLINIC_OR_DEPARTMENT_OTHER): Payer: Medicaid Other | Admitting: Nurse Practitioner

## 2011-06-24 ENCOUNTER — Other Ambulatory Visit: Payer: Self-pay | Admitting: *Deleted

## 2011-06-24 ENCOUNTER — Ambulatory Visit: Payer: Medicaid Other

## 2011-06-24 ENCOUNTER — Other Ambulatory Visit (HOSPITAL_BASED_OUTPATIENT_CLINIC_OR_DEPARTMENT_OTHER): Payer: Medicaid Other | Admitting: Lab

## 2011-06-24 VITALS — BP 146/87 | HR 107 | Temp 96.9°F | Ht 64.0 in | Wt 144.3 lb

## 2011-06-24 VITALS — BP 154/95 | HR 94

## 2011-06-24 DIAGNOSIS — R197 Diarrhea, unspecified: Secondary | ICD-10-CM

## 2011-06-24 DIAGNOSIS — C19 Malignant neoplasm of rectosigmoid junction: Secondary | ICD-10-CM

## 2011-06-24 DIAGNOSIS — Z5111 Encounter for antineoplastic chemotherapy: Secondary | ICD-10-CM

## 2011-06-24 DIAGNOSIS — C7800 Secondary malignant neoplasm of unspecified lung: Secondary | ICD-10-CM

## 2011-06-24 DIAGNOSIS — C20 Malignant neoplasm of rectum: Secondary | ICD-10-CM

## 2011-06-24 LAB — CBC WITH DIFFERENTIAL/PLATELET
Basophils Absolute: 0 10*3/uL (ref 0.0–0.1)
Eosinophils Absolute: 0 10*3/uL (ref 0.0–0.5)
HCT: 38.7 % (ref 38.4–49.9)
HGB: 13.1 g/dL (ref 13.0–17.1)
MONO#: 0.6 10*3/uL (ref 0.1–0.9)
NEUT#: 4.4 10*3/uL (ref 1.5–6.5)
NEUT%: 74.6 % (ref 39.0–75.0)
RDW: 14.2 % (ref 11.0–14.6)
WBC: 5.9 10*3/uL (ref 4.0–10.3)
lymph#: 0.8 10*3/uL — ABNORMAL LOW (ref 0.9–3.3)

## 2011-06-24 LAB — COMPREHENSIVE METABOLIC PANEL
Albumin: 3.4 g/dL — ABNORMAL LOW (ref 3.5–5.2)
BUN: 12 mg/dL (ref 6–23)
Calcium: 8.8 mg/dL (ref 8.4–10.5)
Chloride: 100 mEq/L (ref 96–112)
Glucose, Bld: 160 mg/dL — ABNORMAL HIGH (ref 70–99)
Potassium: 3.4 mEq/L — ABNORMAL LOW (ref 3.5–5.3)
Sodium: 137 mEq/L (ref 135–145)
Total Protein: 7.8 g/dL (ref 6.0–8.3)

## 2011-06-24 MED ORDER — ATROPINE SULFATE 0.4 MG/ML IJ SOLN
0.5000 mg | Freq: Once | INTRAMUSCULAR | Status: DC | PRN
  Filled 2011-06-24: qty 1.25

## 2011-06-24 MED ORDER — SODIUM CHLORIDE 0.9 % IV SOLN
2400.0000 mg/m2 | INTRAVENOUS | Status: DC
  Administered 2011-06-24: 4150 mg via INTRAVENOUS
  Filled 2011-06-24: qty 83

## 2011-06-24 MED ORDER — ONDANSETRON 16 MG/50ML IVPB (CHCC)
16.0000 mg | Freq: Once | INTRAVENOUS | Status: AC
  Administered 2011-06-24: 16 mg via INTRAVENOUS

## 2011-06-24 MED ORDER — DIPHENOXYLATE-ATROPINE 2.5-0.025 MG PO TABS: 2.0000 | ORAL_TABLET | Freq: Four times a day (QID) | ORAL | Status: AC | PRN

## 2011-06-24 MED ORDER — DEXAMETHASONE SODIUM PHOSPHATE 4 MG/ML IJ SOLN
20.0000 mg | Freq: Once | INTRAMUSCULAR | Status: AC
  Administered 2011-06-24: 20 mg via INTRAVENOUS

## 2011-06-24 MED ORDER — SODIUM CHLORIDE 0.9 % IV SOLN
5.0000 mg/kg | Freq: Once | INTRAVENOUS | Status: AC
  Administered 2011-06-24: 325 mg via INTRAVENOUS
  Filled 2011-06-24: qty 13

## 2011-06-24 MED ORDER — LEUCOVORIN CALCIUM INJECTION 350 MG
400.0000 mg/m2 | Freq: Once | INTRAVENOUS | Status: AC
  Administered 2011-06-24: 688 mg via INTRAVENOUS
  Filled 2011-06-24: qty 34.4

## 2011-06-24 MED ORDER — SODIUM CHLORIDE 0.9 % IV SOLN
Freq: Once | INTRAVENOUS | Status: AC
  Administered 2011-06-24: 12:00:00 via INTRAVENOUS

## 2011-06-24 MED ORDER — DIPHENOXYLATE-ATROPINE 2.5-0.025 MG PO TABS: 2.0000 | ORAL_TABLET | Freq: Four times a day (QID) | ORAL | Status: DC | PRN

## 2011-06-24 MED ORDER — IRINOTECAN HCL CHEMO INJECTION 100 MG/5ML
180.0000 mg/m2 | Freq: Once | INTRAVENOUS | Status: AC
  Administered 2011-06-24: 310 mg via INTRAVENOUS
  Filled 2011-06-24: qty 15.5

## 2011-06-24 MED ORDER — FLUOROURACIL CHEMO INJECTION 2.5 GM/50ML
400.0000 mg/m2 | Freq: Once | INTRAVENOUS | Status: AC
  Administered 2011-06-24: 700 mg via INTRAVENOUS
  Filled 2011-06-24: qty 14

## 2011-06-24 NOTE — Progress Notes (Signed)
OFFICE PROGRESS NOTE  Interval history:  Jeffery [name] returns as scheduled. He completed cycle 4 FOLFIRI/Avastin 06/10/2011. He continues to have frequent loose stools, mainly small volume. Imodium has been an effective. He continues to note irritation at the perineum. No nausea or vomiting. No mouth sores. No abdominal pain. He denies bleeding. No shortness of breath or chest pain. No leg swelling or calf pain.  He recently noted a "stitch" at the ileostomy reversal scar.   Objective: Blood pressure 146/87, pulse 107, temperature 96.9 F (36.1 C), temperature source Oral, height 5\' 4"  (1.626 m), weight 144 lb 4.8 oz (65.454 kg).  Oropharynx is without thrush or ulceration. Lungs are clear. Regular cardiac rhythm. Port-A-Cath site is without erythema. Abdomen is soft and nontender. No hepatomegaly. Suture at the lateral edge of the ileostomy reversal scar. Extremities are without edema. Calves are soft and nontender.  Lab Results: Lab Results  Component Value Date   WBC 5.9 06/24/2011   HGB 13.1 06/24/2011   HCT 38.7 06/24/2011   MCV 97.8 06/24/2011   PLT 314 06/24/2011    Chemistry:    Chemistry      Component Value Date/Time   NA 137 06/24/2011 1013   K 3.4* 06/24/2011 1013   CL 100 06/24/2011 1013   CO2 29 06/24/2011 1013   BUN 12 06/24/2011 1013   CREATININE 1.11 06/24/2011 1013      Component Value Date/Time   CALCIUM 8.8 06/24/2011 1013   ALKPHOS 79 06/24/2011 1013   AST 23 06/24/2011 1013   ALT 16 06/24/2011 1013   BILITOT 0.7 06/24/2011 1013       Studies/Results: No results found.  Medications: I have reviewed the patient's current medications.  Assessment/Plan: 1. Clinical stage III (uT3 uN1) rectal cancer status post pelvic radiation, August 24 through November 18, 2009. He underwent a laparoscopic-assisted low anterior resection with diverting loop ileostomy, January 14, 2010, with the final pathology showing invasive adenocarcinoma focally extending into the perirectal connective tissue,  negative margins, extensive ulceration with perforation and perirectal abscess. There was no lymph node involvement (pT3 pN0). He began adjuvant CAPOX chemotherapy March 11, 2010. He completed the sixth and final cycle of CAPOX 06/25/2010. CT of the abdomen and pelvis 03/11/2011 confirmed multiple nodules at the lung bases consistent with metastatic disease. 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 He completed cycle 1 of FOLFIRI/Avastin 04/29/2011. He completed cycle 3 05/27/2011. He completed cycle 4 on 06/10/2011. 2. Strangulated peristomal hernia and small bowel obstruction status post reduction of peristomal hernia, omentectomy, exploratory laparotomy, small bowel resection x2 on January 28, 2010.  3. History of rectal bleeding secondary to number 1.  4. History of an elevated CEA.  5. History of a left adrenal nodule.  6. Weight loss, improved. His weight has increased since the summer of 2012. 7. Right knee pain/effusion status post evaluation at [facility].  8. Recent abdominal pain, nausea and constipation-the nausea and constipation have resolved. He continues to have intermittent abdominal pain. 9. CT abdomen/pelvis 03/11/2011 with multiple nodules at the lung bases consistent with metastatic involvement of the lungs; prominent presacral soft tissue; air bubble within the presacral soft tissue appeared to be extraluminal and breakdown at the anastomotic site could not be excluded. No discrete abscess was seen. 10. Elevated blood pressure. Question effect of Avastin. He was started on Norvasc on 06/10/2011. 11. Frequent bowel movements. Likely related to multiple bowel resections. Imodium has been ineffective. He  will try Lomotil 2 tablets 4 times daily as needed.  Disposition-Jeffery [name] appears stable. Plan to proceed with cycle 5 FOLFIRI/Avastin today as scheduled. He will return for a followup visit and cycle 6  in 2 weeks. He will contact the office in the interim with any problems. He will contact Dr. [name] regarding the suture at the ileostomy reversal scar.  Plan reviewed with Dr. Truett Perna.   Jeffery [name] ANP/GNP-BC

## 2011-06-24 NOTE — Patient Instructions (Signed)
[facility] Discharge Instructions for Patients Receiving Chemotherapy  Today you received the following chemotherapy agents; Avastin, Irinotecan, Leucovorin and Adrucil.  To help prevent nausea and vomiting after your treatment, we encourage you to take your nausea medication. Begin taking it as often as prescribed.    If you develop nausea and vomiting that is not controlled by your nausea medication, call the clinic. If it is after clinic hours your family physician or the after hours number for the clinic or go to the Emergency Department.   BELOW ARE SYMPTOMS THAT SHOULD BE REPORTED IMMEDIATELY:  *FEVER GREATER THAN 100.5 F  *CHILLS WITH OR WITHOUT FEVER  NAUSEA AND VOMITING THAT IS NOT CONTROLLED WITH YOUR NAUSEA MEDICATION  *UNUSUAL SHORTNESS OF BREATH  *UNUSUAL BRUISING OR BLEEDING  TENDERNESS IN MOUTH AND THROAT WITH OR WITHOUT PRESENCE OF ULCERS  *URINARY PROBLEMS  *BOWEL PROBLEMS  UNUSUAL RASH Items with * indicate a potential emergency and should be followed up as soon as possible.  One of the nurses will contact you 24 hours after your treatment. Please let the nurse know about any problems that you may have experienced. Feel free to call the clinic you have any questions or concerns. The clinic phone number is [phone].   I have been informed and understand all the instructions given to me. I know to contact the clinic, my physician, or go to the Emergency Department if any problems should occur. I do not have any questions at this time, but understand that I may call the clinic during office hours   should I have any questions or need assistance in obtaining follow up care.    __________________________________________  _____________  __________ Signature of Patient or Authorized Representative            Date                   Time    __________________________________________ Nurse's Signature

## 2011-06-24 NOTE — Telephone Encounter (Signed)
gve the pt his may 2013 appt calendar. Pt is aware his chemo will be added to the schedule. Sent michelle a staff message

## 2011-06-24 NOTE — Telephone Encounter (Signed)
Per staff message from Crystl, I have scheduled treatment appt to follow MD visit on 5/23. Appt in computer and Crystal aware.  JMW

## 2011-06-26 ENCOUNTER — Ambulatory Visit (HOSPITAL_BASED_OUTPATIENT_CLINIC_OR_DEPARTMENT_OTHER): Payer: Medicaid Other

## 2011-06-26 VITALS — BP 140/88 | HR 104 | Temp 99.5°F

## 2011-06-26 DIAGNOSIS — Z452 Encounter for adjustment and management of vascular access device: Secondary | ICD-10-CM

## 2011-06-26 DIAGNOSIS — C20 Malignant neoplasm of rectum: Secondary | ICD-10-CM

## 2011-06-26 MED ORDER — HEPARIN SOD (PORK) LOCK FLUSH 100 UNIT/ML IV SOLN
500.0000 [IU] | Freq: Once | INTRAVENOUS | Status: AC | PRN
  Administered 2011-06-26: 500 [IU]
  Filled 2011-06-26: qty 5

## 2011-06-26 MED ORDER — SODIUM CHLORIDE 0.9 % IJ SOLN
10.0000 mL | INTRAMUSCULAR | Status: DC | PRN
  Administered 2011-06-26: 10 mL
  Filled 2011-06-26: qty 10

## 2011-06-29 ENCOUNTER — Telehealth (INDEPENDENT_AMBULATORY_CARE_PROVIDER_SITE_OTHER): Payer: Self-pay | Admitting: General Surgery

## 2011-06-29 NOTE — Telephone Encounter (Signed)
Pt is calling to inquire about two blue stitches sticking out of his skin.  Pt advised to clip them off gently with nail clippers, being very careful not to cut his skin.  He understands and will comply.

## 2011-06-30 ENCOUNTER — Other Ambulatory Visit: Payer: Self-pay | Admitting: Certified Registered Nurse Anesthetist

## 2011-07-07 ENCOUNTER — Other Ambulatory Visit: Payer: Self-pay | Admitting: Oncology

## 2011-07-08 ENCOUNTER — Ambulatory Visit (HOSPITAL_BASED_OUTPATIENT_CLINIC_OR_DEPARTMENT_OTHER): Payer: Medicaid Other | Admitting: Nurse Practitioner

## 2011-07-08 ENCOUNTER — Telehealth: Payer: Self-pay | Admitting: Oncology

## 2011-07-08 ENCOUNTER — Ambulatory Visit (HOSPITAL_BASED_OUTPATIENT_CLINIC_OR_DEPARTMENT_OTHER): Payer: Medicaid Other

## 2011-07-08 ENCOUNTER — Telehealth: Payer: Self-pay | Admitting: *Deleted

## 2011-07-08 ENCOUNTER — Other Ambulatory Visit (HOSPITAL_BASED_OUTPATIENT_CLINIC_OR_DEPARTMENT_OTHER): Payer: Medicaid Other | Admitting: Lab

## 2011-07-08 VITALS — BP 140/86 | HR 98 | Temp 97.3°F | Ht 64.0 in | Wt 143.3 lb

## 2011-07-08 DIAGNOSIS — C20 Malignant neoplasm of rectum: Secondary | ICD-10-CM

## 2011-07-08 DIAGNOSIS — C19 Malignant neoplasm of rectosigmoid junction: Secondary | ICD-10-CM

## 2011-07-08 DIAGNOSIS — Z5111 Encounter for antineoplastic chemotherapy: Secondary | ICD-10-CM

## 2011-07-08 DIAGNOSIS — C7800 Secondary malignant neoplasm of unspecified lung: Secondary | ICD-10-CM

## 2011-07-08 LAB — CBC WITH DIFFERENTIAL/PLATELET
BASO%: 0.5 % (ref 0.0–2.0)
Eosinophils Absolute: 0.1 10*3/uL (ref 0.0–0.5)
MCHC: 33.5 g/dL (ref 32.0–36.0)
MCV: 98.7 fL — ABNORMAL HIGH (ref 79.3–98.0)
MONO#: 0.6 10*3/uL (ref 0.1–0.9)
MONO%: 11.6 % (ref 0.0–14.0)
NEUT#: 3.6 10*3/uL (ref 1.5–6.5)
RBC: 3.55 10*6/uL — ABNORMAL LOW (ref 4.20–5.82)
RDW: 14.1 % (ref 11.0–14.6)
WBC: 5.2 10*3/uL (ref 4.0–10.3)
nRBC: 0 % (ref 0–0)

## 2011-07-08 LAB — COMPREHENSIVE METABOLIC PANEL
ALT: 13 U/L (ref 0–53)
AST: 18 U/L (ref 0–37)
CO2: 28 mEq/L (ref 19–32)
Creatinine, Ser: 1.35 mg/dL (ref 0.50–1.35)
Sodium: 141 mEq/L (ref 135–145)
Total Bilirubin: 0.5 mg/dL (ref 0.3–1.2)
Total Protein: 6.7 g/dL (ref 6.0–8.3)

## 2011-07-08 MED ORDER — DEXAMETHASONE SODIUM PHOSPHATE 4 MG/ML IJ SOLN
20.0000 mg | Freq: Once | INTRAMUSCULAR | Status: AC
  Administered 2011-07-08: 20 mg via INTRAVENOUS

## 2011-07-08 MED ORDER — SODIUM CHLORIDE 0.9 % IV SOLN: Freq: Once | INTRAVENOUS | Status: DC

## 2011-07-08 MED ORDER — SODIUM CHLORIDE 0.9 % IV SOLN
2400.0000 mg/m2 | INTRAVENOUS | Status: DC
  Administered 2011-07-08: 4150 mg via INTRAVENOUS
  Filled 2011-07-08: qty 83

## 2011-07-08 MED ORDER — FLUOROURACIL CHEMO INJECTION 2.5 GM/50ML
400.0000 mg/m2 | Freq: Once | INTRAVENOUS | Status: AC
  Administered 2011-07-08: 700 mg via INTRAVENOUS
  Filled 2011-07-08: qty 14

## 2011-07-08 MED ORDER — SODIUM CHLORIDE 0.9 % IV SOLN
5.0000 mg/kg | Freq: Once | INTRAVENOUS | Status: AC
  Administered 2011-07-08: 325 mg via INTRAVENOUS
  Filled 2011-07-08: qty 13

## 2011-07-08 MED ORDER — ATROPINE SULFATE 0.4 MG/ML IJ SOLN
0.5000 mg | Freq: Once | INTRAMUSCULAR | Status: AC | PRN
  Administered 2011-07-08: 0.5 mg via INTRAVENOUS
  Filled 2011-07-08: qty 1.25

## 2011-07-08 MED ORDER — IRINOTECAN HCL CHEMO INJECTION 100 MG/5ML
180.0000 mg/m2 | Freq: Once | INTRAVENOUS | Status: AC
  Administered 2011-07-08: 310 mg via INTRAVENOUS
  Filled 2011-07-08: qty 15.5

## 2011-07-08 MED ORDER — ONDANSETRON 16 MG/50ML IVPB (CHCC)
16.0000 mg | Freq: Once | INTRAVENOUS | Status: AC
  Administered 2011-07-08: 16 mg via INTRAVENOUS

## 2011-07-08 MED ORDER — SODIUM CHLORIDE 0.9 % IV SOLN
Freq: Once | INTRAVENOUS | Status: AC
  Administered 2011-07-08: 13:00:00 via INTRAVENOUS

## 2011-07-08 MED ORDER — LEUCOVORIN CALCIUM INJECTION 350 MG
400.0000 mg/m2 | Freq: Once | INTRAVENOUS | Status: AC
  Administered 2011-07-08: 688 mg via INTRAVENOUS
  Filled 2011-07-08: qty 34.4

## 2011-07-08 NOTE — Telephone Encounter (Signed)
pt received appts for june 2013.  called him and provide ct scan appt for 06/06 @ [facility]

## 2011-07-08 NOTE — Telephone Encounter (Signed)
Gv pt appt for june2013.  scheduled ct scan for 06/06 @ [facility].

## 2011-07-08 NOTE — Telephone Encounter (Signed)
Per message from [place], I have scheduled treatment aptps.  JMW

## 2011-07-08 NOTE — Progress Notes (Signed)
OFFICE PROGRESS NOTE  Interval history:  Jeffery [name] returns as scheduled. He completed cycle 5 FOLFIRI/Avastin on 06/24/2011. He denies nausea/vomiting. No mouth sores. No abdominal pain. He denies bleeding. No shortness of breath or chest pain. The diarrhea has decreased since beginning Lomotil.   Objective: Blood pressure 140/86, pulse 98, temperature 97.3 F (36.3 C), temperature source Oral, height 5\' 4"  (1.626 m), weight 143 lb 4.8 oz (65 kg).  Oropharynx is without thrush or ulceration. Lungs are clear. Regular cardiac rhythm. Port-A-Cath site is without erythema. Abdomen is soft and nontender. No hepatomegaly. Suture at the lateral edge of the ileostomy reversal scar. Extremities are without edema. Calves are soft and nontender.  Lab Results: Lab Results  Component Value Date   WBC 5.2 07/08/2011   HGB 11.8* 07/08/2011   HCT 35.1* 07/08/2011   MCV 98.7* 07/08/2011   PLT 292 07/08/2011    Chemistry:    Chemistry      Component Value Date/Time   NA 137 06/24/2011 1013   K 3.4* 06/24/2011 1013   CL 100 06/24/2011 1013   CO2 29 06/24/2011 1013   BUN 12 06/24/2011 1013   CREATININE 1.11 06/24/2011 1013      Component Value Date/Time   CALCIUM 8.8 06/24/2011 1013   ALKPHOS 79 06/24/2011 1013   AST 23 06/24/2011 1013   ALT 16 06/24/2011 1013   BILITOT 0.7 06/24/2011 1013       Studies/Results: No results found.  Medications: I have reviewed the patient's current medications.  Assessment/Plan:  1. Clinical stage III (uT3 uN1) rectal cancer status post pelvic radiation, August 24 through November 18, 2009. 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 There was no lymph node involvement (pT3 pN0). He began adjuvant CAPOX chemotherapy March 11, 2010. He completed the sixth and final cycle of CAPOX 06/25/2010. CT of the abdomen and pelvis 03/11/2011 confirmed multiple nodules at the lung bases consistent with metastatic disease. 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 He completed cycle 1 of FOLFIRI/Avastin 04/29/2011. He completed cycle 3 05/27/2011. CEA was improved on 06/10/2011. He completed cycle 4 on 06/10/2011. He completed cycle 5 on 06/24/2011. 2. Strangulated peristomal hernia and small bowel obstruction status post reduction of peristomal hernia, omentectomy, exploratory laparotomy, small bowel resection x2 on January 28, 2010.  3. History of rectal bleeding secondary to number 1.  4. History of an elevated CEA.  5. History of a left adrenal nodule.  6. Weight loss, improved. His weight has increased since the summer of 2012. 7. Right knee pain/effusion status post evaluation at [facility].  8. Recent abdominal pain, nausea and constipation-the nausea and constipation have resolved. He continues to have intermittent abdominal pain. 9. CT abdomen/pelvis 03/11/2011 with multiple nodules at the lung bases consistent with metastatic involvement of the lungs; prominent presacral soft tissue; air bubble within the presacral soft tissue appeared to be extraluminal and breakdown at the anastomotic site could not be excluded. No discrete abscess was seen. 10. Elevated blood pressure. Question effect of Avastin. He was started on Norvasc on 06/10/2011. 11. Frequent bowel movements. Likely related to multiple bowel resections. Imodium was not effective. He is having less bowel movements since beginning Lomotil. 12. Suture at the ileostomy reversal scar. We attempted to remove the suture  in the office today unsuccessfully. He will contact Dr. Arita Miss office.  Disposition-Jeffery [name] appears stable. Plan to proceed with cycle 6 FOLFIRI/Avastin today as scheduled. We  are referring him for a restaging noncontrast CT scan of the chest in 2 weeks. He will return for a followup visit in 3 weeks. He will contact the office in the interim with any problems.  Plan reviewed with Dr. Truett Perna.  Lonna Cobb ANP/GNP-BC

## 2011-07-08 NOTE — Patient Instructions (Signed)
[facility] Discharge Instructions for Patients Receiving Chemotherapy  Today you received the following chemotherapy agents Camptosar, Leucovorin, Flourouracil (5FU) To help prevent nausea and vomiting after your treatment, we encourage you to take your nausea medication Compazine 10mg . Begin taking it at 1700 and take it as often as prescribed for the next 72 hours and PRN.   If you develop nausea and vomiting that is not controlled by your nausea medication, call the clinic. If it is after clinic hours your family physician or the after hours number for the clinic or go to the Emergency Department.   BELOW ARE SYMPTOMS THAT SHOULD BE REPORTED IMMEDIATELY:  *FEVER GREATER THAN 100.5 F  *CHILLS WITH OR WITHOUT FEVER  NAUSEA AND VOMITING THAT IS NOT CONTROLLED WITH YOUR NAUSEA MEDICATION  *UNUSUAL SHORTNESS OF BREATH  *UNUSUAL BRUISING OR BLEEDING  TENDERNESS IN MOUTH AND THROAT WITH OR WITHOUT PRESENCE OF ULCERS  *URINARY PROBLEMS  *BOWEL PROBLEMS  UNUSUAL RASH Items with * indicate a potential emergency and should be followed up as soon as possible.  One of the nurses will contact you 24 hours after your treatment. Please let the nurse know about any problems that you may have experienced. Feel free to call the clinic you have any questions or concerns. The clinic phone number is [phone].   I have been informed and understand all the instructions given to me. I know to contact the clinic, my physician, or go to the Emergency Department if any problems should occur. I do not have any questions at this time, but understand that I may call the clinic during office hours   should I have any questions or need assistance in obtaining follow up care.    __________________________________________  _____________  __________ Signature of Patient or Authorized Representative            Date                    Time    __________________________________________ Nurse's Signature

## 2011-07-10 ENCOUNTER — Ambulatory Visit (HOSPITAL_BASED_OUTPATIENT_CLINIC_OR_DEPARTMENT_OTHER): Payer: Medicaid Other

## 2011-07-10 VITALS — BP 132/80 | HR 97 | Temp 98.2°F

## 2011-07-10 DIAGNOSIS — C20 Malignant neoplasm of rectum: Secondary | ICD-10-CM

## 2011-07-10 DIAGNOSIS — Z452 Encounter for adjustment and management of vascular access device: Secondary | ICD-10-CM

## 2011-07-10 MED ORDER — HEPARIN SOD (PORK) LOCK FLUSH 100 UNIT/ML IV SOLN
500.0000 [IU] | Freq: Once | INTRAVENOUS | Status: AC | PRN
  Administered 2011-07-10: 500 [IU]
  Filled 2011-07-10: qty 5

## 2011-07-10 MED ORDER — SODIUM CHLORIDE 0.9 % IJ SOLN
10.0000 mL | INTRAMUSCULAR | Status: DC | PRN
  Administered 2011-07-10: 10 mL
  Filled 2011-07-10: qty 10

## 2011-07-10 NOTE — Patient Instructions (Signed)
Pt in for pump discharge and has alcohol on his breath.  Pt otherwise fine without complaints.  Pt discharged home ambulatory.

## 2011-07-22 ENCOUNTER — Ambulatory Visit (HOSPITAL_COMMUNITY)
Admission: RE | Admit: 2011-07-22 | Discharge: 2011-07-22 | Disposition: A | Discharge status: Home / Self Care | Payer: Medicaid Other | Source: Ambulatory Visit | Attending: Nurse Practitioner | Admitting: Nurse Practitioner

## 2011-07-22 DIAGNOSIS — R918 Other nonspecific abnormal finding of lung field: Secondary | ICD-10-CM | POA: Insufficient documentation

## 2011-07-22 DIAGNOSIS — C20 Malignant neoplasm of rectum: Secondary | ICD-10-CM

## 2011-07-26 ENCOUNTER — Other Ambulatory Visit: Payer: Self-pay | Admitting: Certified Registered Nurse Anesthetist

## 2011-07-28 ENCOUNTER — Other Ambulatory Visit: Payer: Self-pay | Admitting: Oncology

## 2011-07-29 ENCOUNTER — Ambulatory Visit (HOSPITAL_BASED_OUTPATIENT_CLINIC_OR_DEPARTMENT_OTHER): Payer: Medicaid Other | Admitting: Nurse Practitioner

## 2011-07-29 ENCOUNTER — Ambulatory Visit (HOSPITAL_BASED_OUTPATIENT_CLINIC_OR_DEPARTMENT_OTHER): Payer: Medicaid Other

## 2011-07-29 ENCOUNTER — Telehealth: Payer: Self-pay | Admitting: *Deleted

## 2011-07-29 ENCOUNTER — Telehealth: Payer: Self-pay | Admitting: Oncology

## 2011-07-29 ENCOUNTER — Other Ambulatory Visit (HOSPITAL_BASED_OUTPATIENT_CLINIC_OR_DEPARTMENT_OTHER): Payer: Medicaid Other | Admitting: Lab

## 2011-07-29 VITALS — BP 138/88 | HR 111 | Temp 97.4°F | Ht 64.0 in | Wt 140.7 lb

## 2011-07-29 DIAGNOSIS — Z5111 Encounter for antineoplastic chemotherapy: Secondary | ICD-10-CM

## 2011-07-29 DIAGNOSIS — C19 Malignant neoplasm of rectosigmoid junction: Secondary | ICD-10-CM

## 2011-07-29 DIAGNOSIS — R197 Diarrhea, unspecified: Secondary | ICD-10-CM

## 2011-07-29 DIAGNOSIS — C7800 Secondary malignant neoplasm of unspecified lung: Secondary | ICD-10-CM

## 2011-07-29 DIAGNOSIS — C20 Malignant neoplasm of rectum: Secondary | ICD-10-CM

## 2011-07-29 DIAGNOSIS — R109 Unspecified abdominal pain: Secondary | ICD-10-CM

## 2011-07-29 DIAGNOSIS — Z5112 Encounter for antineoplastic immunotherapy: Secondary | ICD-10-CM

## 2011-07-29 LAB — COMPREHENSIVE METABOLIC PANEL
AST: 17 U/L (ref 0–37)
Alkaline Phosphatase: 76 U/L (ref 39–117)
BUN: 13 mg/dL (ref 6–23)
Calcium: 9.3 mg/dL (ref 8.4–10.5)
Chloride: 103 mEq/L (ref 96–112)
Creatinine, Ser: 1.17 mg/dL (ref 0.50–1.35)
Total Bilirubin: 0.6 mg/dL (ref 0.3–1.2)

## 2011-07-29 LAB — CBC WITH DIFFERENTIAL/PLATELET
Basophils Absolute: 0 10*3/uL (ref 0.0–0.1)
Eosinophils Absolute: 0 10*3/uL (ref 0.0–0.5)
HCT: 39.3 % (ref 38.4–49.9)
HGB: 13.1 g/dL (ref 13.0–17.1)
MCV: 95.4 fL (ref 79.3–98.0)
MONO%: 13.3 % (ref 0.0–14.0)
NEUT#: 4.5 10*3/uL (ref 1.5–6.5)
RDW: 13.7 % (ref 11.0–14.6)
lymph#: 1.1 10*3/uL (ref 0.9–3.3)

## 2011-07-29 MED ORDER — ONDANSETRON 16 MG/50ML IVPB (CHCC)
16.0000 mg | Freq: Once | INTRAVENOUS | Status: AC
  Administered 2011-07-29: 16 mg via INTRAVENOUS

## 2011-07-29 MED ORDER — PAREGORIC 2 MG/5ML PO TINC: 5.0000 mL | Freq: Four times a day (QID) | ORAL | Status: AC | PRN

## 2011-07-29 MED ORDER — SODIUM CHLORIDE 0.9 % IV SOLN
Freq: Once | INTRAVENOUS | Status: DC
Start: 2011-07-29 — End: 2011-07-29

## 2011-07-29 MED ORDER — FLUOROURACIL CHEMO INJECTION 2.5 GM/50ML
400.0000 mg/m2 | Freq: Once | INTRAVENOUS | Status: AC
  Administered 2011-07-29: 700 mg via INTRAVENOUS
  Filled 2011-07-29: qty 14

## 2011-07-29 MED ORDER — SODIUM CHLORIDE 0.9 % IV SOLN
2400.0000 mg/m2 | INTRAVENOUS | Status: DC
  Administered 2011-07-29: 4150 mg via INTRAVENOUS
  Filled 2011-07-29: qty 83

## 2011-07-29 MED ORDER — PAREGORIC 2 MG/5ML PO TINC: 5.0000 mL | Freq: Four times a day (QID) | ORAL | Status: DC | PRN

## 2011-07-29 MED ORDER — SODIUM CHLORIDE 0.9 % IV SOLN
5.0000 mg/kg | Freq: Once | INTRAVENOUS | Status: AC
  Administered 2011-07-29: 325 mg via INTRAVENOUS
  Filled 2011-07-29: qty 13

## 2011-07-29 MED ORDER — DEXAMETHASONE SODIUM PHOSPHATE 4 MG/ML IJ SOLN
20.0000 mg | Freq: Once | INTRAMUSCULAR | Status: AC
  Administered 2011-07-29: 20 mg via INTRAVENOUS

## 2011-07-29 MED ORDER — IRINOTECAN HCL CHEMO INJECTION 100 MG/5ML
180.0000 mg/m2 | Freq: Once | INTRAVENOUS | Status: AC
  Administered 2011-07-29: 310 mg via INTRAVENOUS
  Filled 2011-07-29: qty 15.5

## 2011-07-29 MED ORDER — LEUCOVORIN CALCIUM INJECTION 350 MG
400.0000 mg/m2 | Freq: Once | INTRAVENOUS | Status: AC
  Administered 2011-07-29: 688 mg via INTRAVENOUS
  Filled 2011-07-29: qty 34.4

## 2011-07-29 MED ORDER — ATROPINE SULFATE 0.4 MG/ML IJ SOLN
0.5000 mg | Freq: Once | INTRAMUSCULAR | Status: AC | PRN
  Administered 2011-07-29: 0.5 mg via INTRAVENOUS
  Filled 2011-07-29: qty 1.25

## 2011-07-29 NOTE — Patient Instructions (Signed)
[facility] Discharge Instructions for Patients Receiving Chemotherapy  Today you received the following chemotherapy agents Avastin/5 Fu/Leucovorin/Irinotecan To help prevent nausea and vomiting after your treatment, we encourage you to take your nausea medication as prescribed.  If you develop nausea and vomiting that is not controlled by your nausea medication, call the clinic. If it is after clinic hours your family physician or the after hours number for the clinic or go to the Emergency Department.   BELOW ARE SYMPTOMS THAT SHOULD BE REPORTED IMMEDIATELY:  *FEVER GREATER THAN 100.5 F  *CHILLS WITH OR WITHOUT FEVER  NAUSEA AND VOMITING THAT IS NOT CONTROLLED WITH YOUR NAUSEA MEDICATION  *UNUSUAL SHORTNESS OF BREATH  *UNUSUAL BRUISING OR BLEEDING  TENDERNESS IN MOUTH AND THROAT WITH OR WITHOUT PRESENCE OF ULCERS  *URINARY PROBLEMS  *BOWEL PROBLEMS  UNUSUAL RASH Items with * indicate a potential emergency and should be followed up as soon as possible.  One of the nurses will contact you 24 hours after your treatment. Please let the nurse know about any problems that you may have experienced. Feel free to call the clinic you have any questions or concerns. The clinic phone number is [phone].   I have been informed and understand all the instructions given to me. I know to contact the clinic, my physician, or go to the Emergency Department if any problems should occur. I do not have any questions at this time, but understand that I may call the clinic during office hours   should I have any questions or need assistance in obtaining follow up care.    __________________________________________  _____________  __________ Signature of Patient or Authorized Representative            Date                   Time    __________________________________________ Nurse's Signature

## 2011-07-29 NOTE — Progress Notes (Signed)
Trace of protein noted in urine. Pharmacy notified.

## 2011-07-29 NOTE — Telephone Encounter (Signed)
Per staff message I have scheduled appts. JMW  

## 2011-07-29 NOTE — Telephone Encounter (Signed)
gv pt appt for june-july2013. informed pt that michele will add chemo appts for 06/27 and 07/11. sent emai to michele to add chemo appts

## 2011-07-29 NOTE — Progress Notes (Signed)
OFFICE PROGRESS NOTE  Interval history:  Jeffery [name] returns as scheduled. He completed cycle 6 FOLFIRI/Avastin on 07/08/2011. Restaging CT evaluation 07/22/2011 showed bilateral pulmonary nodules consistent with metastatic disease. Index lesions were slightly decreased in size from the previous scan.  He denies nausea/vomiting. No mouth sores. He continues to have loose stools. He estimates 12 loose stools a day. He is taking approximately 3 Lomotil tablets a day. He denies bleeding. No shortness of breath or chest pain. No leg swelling or calf pain.   Objective: Blood pressure 138/88, pulse 111, temperature 97.4 F (36.3 C), temperature source Oral, height 5\' 4"  (1.626 m), weight 140 lb 11.2 oz (63.821 kg).  Oropharynx is without thrush or ulceration. Lungs are clear. Regular cardiac rhythm. Port-A-Cath site is without erythema. Abdomen is soft and nontender. No hepatomegaly. Extremities are without edema.  Lab Results: Lab Results  Component Value Date   WBC 6.5 07/29/2011   HGB 13.1 07/29/2011   HCT 39.3 07/29/2011   MCV 95.4 07/29/2011   PLT 314 07/29/2011    Chemistry:    Chemistry      Component Value Date/Time   NA 141 07/08/2011 1116   K 3.8 07/08/2011 1116   CL 106 07/08/2011 1116   CO2 28 07/08/2011 1116   BUN 13 07/08/2011 1116   CREATININE 1.35 07/08/2011 1116      Component Value Date/Time   CALCIUM 8.4 07/08/2011 1116   ALKPHOS 64 07/08/2011 1116   AST 18 07/08/2011 1116   ALT 13 07/08/2011 1116   BILITOT 0.5 07/08/2011 1116       Studies/Results: Ct Chest Wo Contrast  07/22/2011  *RADIOLOGY REPORT*  Clinical Data: Restaging metastatic rectal cancer  CT CHEST WITHOUT CONTRAST  Technique:  Multidetector CT imaging of the chest was performed following the standard protocol without IV contrast.  Comparison: 04/13/2011  Findings: No supraclavicular or axillary adenopathy.  No mediastinal or hilar adenopathy.  No pericardial or pleural effusion identified.  Multiple, bilateral  pulmonary parenchymal nodules are identified. Index lesion within the right upper lobe measures 1.6 cm, image 30. This is unchanged from previous exam.  Index lesion within the right lower lobe measures 1.3 cm, image 35.  Previously 1.7 cm. Left lower lobe index lesion measures 0.8 cm, image 48.  Previously 1.2 cm.  Review of the visualized bony structures is negative for lytic or sclerotic lesion.  IMPRESSION:  1.  No acute findings. 2.  Bilateral pulmonary nodules consistent with metastatic disease. Index lesions are slightly decreased in size from previous exam.  Original Report Authenticated By: Rosealee Albee, M.D.    Medications: I have reviewed the patient's current medications.  Assessment/Plan:  1. Clinical stage III (uT3 uN1) rectal cancer status post pelvic radiation, August 24 through November 18, 2009. 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 There was no lymph node involvement (pT3 pN0). He began adjuvant CAPOX chemotherapy March 11, 2010. He completed the sixth and final cycle of CAPOX 06/25/2010. CT of the abdomen and pelvis 03/11/2011 confirmed multiple nodules at the lung bases consistent with metastatic disease. 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 He completed cycle 1 of FOLFIRI/Avastin 04/29/2011. He completed cycle 3 05/27/2011. CEA was improved on 06/10/2011. He completed cycle 4 on 06/10/2011. He completed cycle 5  on 06/24/2011. He completed cycle 6 on 07/08/2011. Restaging CT evaluation 07/22/2011 showed a slight decrease in pulmonary nodules. 2. Strangulated peristomal hernia and small bowel obstruction status post reduction of peristomal hernia,  omentectomy, exploratory laparotomy, small bowel resection x2 on January 28, 2010.  3. History of rectal bleeding secondary to number 1.  4. History of an elevated CEA. Improved on 06/10/2011. 5. History of a left adrenal nodule.  6. Weight loss, improved. His weight has increased since the summer of 2012. 7. Right knee pain/effusion status post evaluation at [facility].  8. Recent abdominal pain, nausea and constipation-the nausea and constipation have resolved. He continues to have intermittent abdominal pain. 9. CT abdomen/pelvis 03/11/2011 with multiple nodules at the lung bases consistent with metastatic involvement of the lungs; prominent presacral soft tissue; air bubble within the presacral soft tissue appeared to be extraluminal and breakdown at the anastomotic site could not be excluded. No discrete abscess was seen. 10. Elevated blood pressure. Question effect of Avastin. He was started on Norvasc on 06/10/2011. 11. Frequent bowel movements. Likely related to multiple bowel resections. Imodium was not effective. He continues to have frequent bowel movements. He will try paregoric 5 cc 4 times daily as needed. 12. Suture at the ileostomy reversal scar.   Disposition-[name] appears stable. The recent restaging chest CT showed slight improvement in the pulmonary nodules. Plan to proceed with cycle 7 of FOLFIRI/Avastin today as scheduled. Next restaging CT evaluation will be at a three-month interval. He will return for a followup visit and cycle 8 in 2 weeks. He will contact the office in the interim with any problems.  Plan reviewed with Dr. Truett Perna.  Jeffery [name] ANP/GNP-BC

## 2011-07-31 ENCOUNTER — Ambulatory Visit (HOSPITAL_BASED_OUTPATIENT_CLINIC_OR_DEPARTMENT_OTHER): Payer: Medicaid Other

## 2011-07-31 VITALS — BP 149/88 | HR 104 | Temp 98.9°F

## 2011-07-31 DIAGNOSIS — C20 Malignant neoplasm of rectum: Secondary | ICD-10-CM

## 2011-07-31 DIAGNOSIS — C19 Malignant neoplasm of rectosigmoid junction: Secondary | ICD-10-CM

## 2011-07-31 DIAGNOSIS — Z452 Encounter for adjustment and management of vascular access device: Secondary | ICD-10-CM

## 2011-07-31 MED ORDER — SODIUM CHLORIDE 0.9 % IJ SOLN
10.0000 mL | INTRAMUSCULAR | Status: DC | PRN
  Administered 2011-07-31: 10 mL
  Filled 2011-07-31: qty 10

## 2011-07-31 MED ORDER — HEPARIN SOD (PORK) LOCK FLUSH 100 UNIT/ML IV SOLN
500.0000 [IU] | Freq: Once | INTRAVENOUS | Status: AC | PRN
  Administered 2011-07-31: 500 [IU]
  Filled 2011-07-31: qty 5

## 2011-08-11 ENCOUNTER — Other Ambulatory Visit: Payer: Self-pay | Admitting: Oncology

## 2011-08-12 ENCOUNTER — Ambulatory Visit (HOSPITAL_BASED_OUTPATIENT_CLINIC_OR_DEPARTMENT_OTHER): Payer: Medicaid Other | Admitting: Nurse Practitioner

## 2011-08-12 ENCOUNTER — Ambulatory Visit (HOSPITAL_BASED_OUTPATIENT_CLINIC_OR_DEPARTMENT_OTHER): Payer: Medicaid Other

## 2011-08-12 ENCOUNTER — Other Ambulatory Visit: Payer: Medicaid Other | Admitting: Lab

## 2011-08-12 ENCOUNTER — Other Ambulatory Visit: Payer: Self-pay | Admitting: Nurse Practitioner

## 2011-08-12 VITALS — BP 146/91 | HR 106 | Temp 98.4°F | Ht 64.0 in | Wt 142.7 lb

## 2011-08-12 VITALS — BP 161/105 | HR 92 | Temp 97.1°F

## 2011-08-12 DIAGNOSIS — C7800 Secondary malignant neoplasm of unspecified lung: Secondary | ICD-10-CM

## 2011-08-12 DIAGNOSIS — C19 Malignant neoplasm of rectosigmoid junction: Secondary | ICD-10-CM

## 2011-08-12 DIAGNOSIS — C20 Malignant neoplasm of rectum: Secondary | ICD-10-CM

## 2011-08-12 DIAGNOSIS — Z5111 Encounter for antineoplastic chemotherapy: Secondary | ICD-10-CM

## 2011-08-12 DIAGNOSIS — N529 Male erectile dysfunction, unspecified: Secondary | ICD-10-CM

## 2011-08-12 LAB — CBC WITH DIFFERENTIAL/PLATELET
BASO%: 0.4 % (ref 0.0–2.0)
EOS%: 0.4 % (ref 0.0–7.0)
HCT: 36.7 % — ABNORMAL LOW (ref 38.4–49.9)
LYMPH%: 11.3 % — ABNORMAL LOW (ref 14.0–49.0)
MCH: 32.4 pg (ref 27.2–33.4)
MCHC: 33.4 g/dL (ref 32.0–36.0)
MCV: 97.1 fL (ref 79.3–98.0)
MONO%: 13.1 % (ref 0.0–14.0)
NEUT%: 74.8 % (ref 39.0–75.0)
Platelets: 283 10*3/uL (ref 140–400)

## 2011-08-12 LAB — COMPREHENSIVE METABOLIC PANEL
ALT: 9 U/L (ref 0–53)
AST: 14 U/L (ref 0–37)
Creatinine, Ser: 1.08 mg/dL (ref 0.50–1.35)
Total Bilirubin: 0.6 mg/dL (ref 0.3–1.2)

## 2011-08-12 MED ORDER — SILDENAFIL CITRATE 25 MG PO TABS: 25.0000 mg | ORAL_TABLET | Freq: Every day | ORAL | Status: AC | PRN

## 2011-08-12 MED ORDER — SODIUM CHLORIDE 0.9 % IV SOLN
2400.0000 mg/m2 | INTRAVENOUS | Status: DC
  Administered 2011-08-12: 4150 mg via INTRAVENOUS
  Filled 2011-08-12: qty 83

## 2011-08-12 MED ORDER — LEUCOVORIN CALCIUM INJECTION 350 MG
400.0000 mg/m2 | Freq: Once | INTRAVENOUS | Status: AC
  Administered 2011-08-12: 688 mg via INTRAVENOUS
  Filled 2011-08-12: qty 34.4

## 2011-08-12 MED ORDER — ONDANSETRON 16 MG/50ML IVPB (CHCC)
16.0000 mg | Freq: Once | INTRAVENOUS | Status: AC
  Administered 2011-08-12: 16 mg via INTRAVENOUS

## 2011-08-12 MED ORDER — SODIUM CHLORIDE 0.9 % IV SOLN
Freq: Once | INTRAVENOUS | Status: AC
  Administered 2011-08-12: 13:00:00 via INTRAVENOUS

## 2011-08-12 MED ORDER — FLUOROURACIL CHEMO INJECTION 2.5 GM/50ML
400.0000 mg/m2 | Freq: Once | INTRAVENOUS | Status: AC
  Administered 2011-08-12: 700 mg via INTRAVENOUS
  Filled 2011-08-12: qty 14

## 2011-08-12 MED ORDER — DEXAMETHASONE SODIUM PHOSPHATE 4 MG/ML IJ SOLN
20.0000 mg | Freq: Once | INTRAMUSCULAR | Status: AC
  Administered 2011-08-12: 20 mg via INTRAVENOUS

## 2011-08-12 MED ORDER — IRINOTECAN HCL CHEMO INJECTION 100 MG/5ML
180.0000 mg/m2 | Freq: Once | INTRAVENOUS | Status: AC
  Administered 2011-08-12: 310 mg via INTRAVENOUS
  Filled 2011-08-12: qty 15.5

## 2011-08-12 MED ORDER — SODIUM CHLORIDE 0.9 % IV SOLN
5.0000 mg/kg | Freq: Once | INTRAVENOUS | Status: AC
  Administered 2011-08-12: 325 mg via INTRAVENOUS
  Filled 2011-08-12: qty 13

## 2011-08-12 NOTE — Progress Notes (Signed)
OFFICE PROGRESS NOTE  Interval history:  Jeffery [name] returns as scheduled. He completed cycle 7 FOLFIRI/Avastin 07/29/2011. He denies nausea/vomiting. No mouth sores. He continues to have frequent loose stools. He has not obtained the paregoric yet. He denies shortness of breath or chest pain. No leg swelling or calf pain. He denies bleeding. Since beginning chemotherapy he is unable to achieve an erection.   Objective: Blood pressure 146/91, pulse 106, temperature 98.4 F (36.9 C), temperature source Oral, height 5\' 4"  (1.626 m), weight 142 lb 11.2 oz (64.728 kg).  Oropharynx is without thrush or ulceration. Lungs are clear. Regular cardiac rhythm. Port-A-Cath site is without erythema. Abdomen is soft and nontender. No hepatomegaly. Extremities are without edema.  Lab Results: Lab Results  Component Value Date   WBC 5.3 08/12/2011   HGB 12.3* 08/12/2011   HCT 36.7* 08/12/2011   MCV 97.1 08/12/2011   PLT 283 08/12/2011    Chemistry:    Chemistry      Component Value Date/Time   NA 139 07/29/2011 0939   K 3.9 07/29/2011 0939   CL 103 07/29/2011 0939   CO2 31 07/29/2011 0939   BUN 13 07/29/2011 0939   CREATININE 1.17 07/29/2011 0939      Component Value Date/Time   CALCIUM 9.3 07/29/2011 0939   ALKPHOS 76 07/29/2011 0939   AST 17 07/29/2011 0939   ALT 11 07/29/2011 0939   BILITOT 0.6 07/29/2011 0939       Studies/Results: Ct Chest Wo Contrast  07/22/2011  *RADIOLOGY REPORT*  Clinical Data: Restaging metastatic rectal cancer  CT CHEST WITHOUT CONTRAST  Technique:  Multidetector CT imaging of the chest was performed following the standard protocol without IV contrast.  Comparison: 04/13/2011  Findings: No supraclavicular or axillary adenopathy.  No mediastinal or hilar adenopathy.  No pericardial or pleural effusion identified.  Multiple, bilateral pulmonary parenchymal nodules are identified. Index lesion within the right upper lobe measures 1.6 cm, image 30. This is unchanged from previous  exam.  Index lesion within the right lower lobe measures 1.3 cm, image 35.  Previously 1.7 cm. Left lower lobe index lesion measures 0.8 cm, image 48.  Previously 1.2 cm.  Review of the visualized bony structures is negative for lytic or sclerotic lesion.  IMPRESSION:  1.  No acute findings. 2.  Bilateral pulmonary nodules consistent with metastatic disease. Index lesions are slightly decreased in size from previous exam.  Original Report Authenticated By: Rosealee Albee, M.D.    Medications: I have reviewed the patient's current medications.  Assessment/Plan:  1. Clinical stage III (uT3 uN1) rectal cancer status post pelvic radiation, August 24 through November 18, 2009. 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 There was no lymph node involvement (pT3 pN0). He began adjuvant CAPOX chemotherapy March 11, 2010. He completed the sixth and final cycle of CAPOX 06/25/2010. CT of the abdomen and pelvis 03/11/2011 confirmed multiple nodules at the lung bases consistent with metastatic disease. 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 He completed cycle 1 of FOLFIRI/Avastin 04/29/2011. He completed cycle 3 05/27/2011. CEA was improved on 06/10/2011. He completed cycle 4 on 06/10/2011. He completed cycle 5 on 06/24/2011. He completed cycle 6 on 07/08/2011. Restaging CT evaluation 07/22/2011 showed a slight decrease in pulmonary nodules. He completed cycle  7 FOLFIRI/Avastin on 07/29/2011. 2. Strangulated peristomal hernia and small bowel obstruction status post reduction of peristomal hernia, omentectomy, exploratory laparotomy, small bowel resection x2 on January 28, 2010.  3. History of  rectal bleeding secondary to number 1.  4. History of an elevated CEA. Improved on 06/10/2011 and 07/29/2011. 5. History of a left adrenal nodule.  6. Weight loss, improved.  7. Right knee pain/effusion status post evaluation at [facility].  8. CT abdomen/pelvis 03/11/2011 with multiple nodules at the lung bases consistent with metastatic involvement of the lungs; prominent presacral soft tissue; air bubble within the presacral soft tissue appeared to be extraluminal and breakdown at the anastomotic site could not be excluded. No discrete abscess was seen. 9. Elevated blood pressure. Question effect of Avastin. He was started on Norvasc on 06/10/2011. 10. Frequent bowel movements. Likely related to multiple bowel resections. Imodium was not effective. He continues to have frequent bowel movements. He will try paregoric 5 cc 4 times daily as needed. 11. Suture at the ileostomy reversal scar.  12. Erectile dysfunction. We are checking a testosterone level today. He will try Viagra 25 mg daily as needed.  Disposition-Mr. Aul appears stable. Plan to proceed with cycle 8 FOLFIRI/Avastin today as scheduled. He will return for a followup visit and cycle 9 in 2 weeks. He will contact the office in the interim with any problems.  Plan reviewed with Dr. Truett Perna.  Lonna Cobb ANP/GNP-BC

## 2011-08-12 NOTE — Patient Instructions (Addendum)
[facility] Discharge Instructions for Patients Receiving Chemotherapy  Today you received the following chemotherapy agents Avastin, Irinotecan, Leucovorin and 5FU  To help prevent nausea and vomiting after your treatment, we encourage you to take your nausea medication as prescribed.   If you develop nausea and vomiting that is not controlled by your nausea medication, call the clinic. If it is after clinic hours your family physician or the after hours number for the clinic or go to the Emergency Department.   BELOW ARE SYMPTOMS THAT SHOULD BE REPORTED IMMEDIATELY:  *FEVER GREATER THAN 100.5 F  *CHILLS WITH OR WITHOUT FEVER  NAUSEA AND VOMITING THAT IS NOT CONTROLLED WITH YOUR NAUSEA MEDICATION  *UNUSUAL SHORTNESS OF BREATH  *UNUSUAL BRUISING OR BLEEDING  TENDERNESS IN MOUTH AND THROAT WITH OR WITHOUT PRESENCE OF ULCERS  *URINARY PROBLEMS  *BOWEL PROBLEMS  UNUSUAL RASH Items with * indicate a potential emergency and should be followed up as soon as possible.  One of the nurses will contact you 24 hours after your treatment. Please let the nurse know about any problems that you may have experienced. Feel free to call the clinic you have any questions or concerns. The clinic phone number is [phone].   I have been informed and understand all the instructions given to me. I know to contact the clinic, my physician, or go to the Emergency Department if any problems should occur. I do not have any questions at this time, but understand that I may call the clinic during office hours   should I have any questions or need assistance in obtaining follow up care.    __________________________________________  _____________  __________ Signature of Patient or Authorized Representative            Date                   Time    __________________________________________ Nurse's Signature    

## 2011-08-14 ENCOUNTER — Ambulatory Visit (HOSPITAL_BASED_OUTPATIENT_CLINIC_OR_DEPARTMENT_OTHER): Payer: Medicaid Other

## 2011-08-14 VITALS — BP 146/93 | HR 105 | Temp 98.4°F

## 2011-08-14 DIAGNOSIS — C19 Malignant neoplasm of rectosigmoid junction: Secondary | ICD-10-CM

## 2011-08-14 DIAGNOSIS — Z452 Encounter for adjustment and management of vascular access device: Secondary | ICD-10-CM

## 2011-08-14 DIAGNOSIS — C20 Malignant neoplasm of rectum: Secondary | ICD-10-CM

## 2011-08-14 DIAGNOSIS — C787 Secondary malignant neoplasm of liver and intrahepatic bile duct: Secondary | ICD-10-CM

## 2011-08-14 MED ORDER — SODIUM CHLORIDE 0.9 % IJ SOLN
10.0000 mL | INTRAMUSCULAR | Status: DC | PRN
  Administered 2011-08-14: 10 mL
  Filled 2011-08-14: qty 10

## 2011-08-14 MED ORDER — HEPARIN SOD (PORK) LOCK FLUSH 100 UNIT/ML IV SOLN
500.0000 [IU] | Freq: Once | INTRAVENOUS | Status: AC | PRN
  Administered 2011-08-14: 500 [IU]
  Filled 2011-08-14: qty 5

## 2011-08-16 ENCOUNTER — Other Ambulatory Visit: Payer: Self-pay | Admitting: Certified Registered Nurse Anesthetist

## 2011-08-17 LAB — TESTOSTERONE: Testosterone: 507.91 ng/dL (ref 300–890)

## 2011-08-25 ENCOUNTER — Other Ambulatory Visit: Payer: Self-pay | Admitting: Oncology

## 2011-08-26 ENCOUNTER — Ambulatory Visit: Payer: Medicaid Other

## 2011-08-26 ENCOUNTER — Other Ambulatory Visit (HOSPITAL_BASED_OUTPATIENT_CLINIC_OR_DEPARTMENT_OTHER): Payer: Medicaid Other | Admitting: Lab

## 2011-08-26 ENCOUNTER — Telehealth: Payer: Self-pay | Admitting: Oncology

## 2011-08-26 ENCOUNTER — Ambulatory Visit (HOSPITAL_BASED_OUTPATIENT_CLINIC_OR_DEPARTMENT_OTHER): Payer: Medicaid Other | Admitting: Oncology

## 2011-08-26 VITALS — BP 162/90 | HR 102 | Temp 97.0°F | Ht 64.0 in | Wt 143.6 lb

## 2011-08-26 DIAGNOSIS — N529 Male erectile dysfunction, unspecified: Secondary | ICD-10-CM

## 2011-08-26 DIAGNOSIS — C20 Malignant neoplasm of rectum: Secondary | ICD-10-CM

## 2011-08-26 DIAGNOSIS — R197 Diarrhea, unspecified: Secondary | ICD-10-CM

## 2011-08-26 DIAGNOSIS — C7800 Secondary malignant neoplasm of unspecified lung: Secondary | ICD-10-CM

## 2011-08-26 DIAGNOSIS — I10 Essential (primary) hypertension: Secondary | ICD-10-CM

## 2011-08-26 LAB — COMPREHENSIVE METABOLIC PANEL
ALT: 9 U/L (ref 0–53)
AST: 15 U/L (ref 0–37)
Albumin: 3.2 g/dL — ABNORMAL LOW (ref 3.5–5.2)
Calcium: 9 mg/dL (ref 8.4–10.5)
Chloride: 102 mEq/L (ref 96–112)
Creatinine, Ser: 1.07 mg/dL (ref 0.50–1.35)
Potassium: 3.7 mEq/L (ref 3.5–5.3)

## 2011-08-26 LAB — CBC WITH DIFFERENTIAL/PLATELET
BASO%: 0.4 % (ref 0.0–2.0)
EOS%: 0.3 % (ref 0.0–7.0)
MCH: 32.6 pg (ref 27.2–33.4)
MCHC: 34 g/dL (ref 32.0–36.0)
RDW: 14.5 % (ref 11.0–14.6)
lymph#: 0.8 10*3/uL — ABNORMAL LOW (ref 0.9–3.3)

## 2011-08-26 MED ORDER — AMLODIPINE BESYLATE 5 MG PO TABS: 10.0000 mg | ORAL_TABLET | Freq: Every day | ORAL | Status: DC

## 2011-08-26 NOTE — Progress Notes (Unsigned)
Pt. arrived in infusion after seeing Dr. Truett Perna, BP repeated in infusion room at 165/107. Pt. also reported to me that his mom just got sent to the ED at [facility] and would like to postpone tmt.    Reviewed with Dr. Truett Perna, pt. is to increase Norvasc from 50mg  daily to 10mg  daily. Pt. also instructed to repeat BP on Saturday and go to emergency room if further issues. Pt. Is to call on Monday to report BP reading to Dr. Kalman Drape office.   Per Dr. Truett Perna, we can postpone chemo x 1 week to next Thursday.  Above relayed to pts. and he will return to [facility] to pick up his new appts.  Darl Pikes will verify with Dr. Truett Perna if repeat CBC is needed.  HL

## 2011-08-26 NOTE — Progress Notes (Signed)
[facility]    OFFICE PROGRESS NOTE   INTERVAL HISTORY:   He returns as scheduled. He completed another cycle of FOLFIRI on 08/12/2011. No nausea, mouth sores, or change in bowel habits following chemotherapy. He continues to have multiple loose stools daily. He is not taking the Lomotil as prescribed. He continues to have difficulty with obtaining an erection. Mr. Stene says he is unable to afford the prescription for Viagra.  Objective:  Vital signs in last 24 hours:  Blood pressure 162/90, pulse 102, temperature 97 F (36.1 C), temperature source Oral, height 5\' 4"  (1.626 m), weight 143 lb 9.6 oz (65.137 kg).    HEENT: No thrush or ulcers Resp: Lungs clear bilaterally Cardio: Regular rate and rhythm GI: No hepatomegaly, nontender Vascular: No leg edema   Portacath/PICC-without erythema  Lab Results:  Lab Results  Component Value Date   WBC 6.3 08/26/2011   HGB 12.2* 08/26/2011   HCT 35.9* 08/26/2011   MCV 95.8 08/26/2011   PLT 306 08/26/2011   ANC 4.6 Urine protein 30    Medications: I have reviewed the patient's current medications.  Assessment/Plan: 1. Clinical stage III (uT3 uN1) rectal cancer status post pelvic radiation, August 24 through November 18, 2009. 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 There was no lymph node involvement (pT3 pN0). He began adjuvant CAPOX chemotherapy March 11, 2010. He completed the sixth and final cycle of CAPOX 06/25/2010. CT of the abdomen and pelvis 03/11/2011 confirmed multiple nodules at the lung bases consistent with metastatic disease. 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 He completed  cycle 1 of FOLFIRI/Avastin 04/29/2011. He completed cycle 3 05/27/2011. CEA was improved on 06/10/2011. He completed cycle 4 on 06/10/2011. He completed cycle 5 on 06/24/2011. He completed cycle 6 on 07/08/2011. Restaging CT evaluation 07/22/2011 showed a slight decrease in pulmonary nodules. He completed cycle 8 FOLFIRI/Avastin on 08/12/2011. 2. Strangulated peristomal hernia and small bowel obstruction status post reduction of peristomal hernia, omentectomy, exploratory laparotomy, small bowel resection x2 on January 28, 2010.  3. History of rectal bleeding secondary to number 1.  4. History of an elevated CEA. Improved on 06/10/2011 and 07/29/2011. 5. History of a left adrenal nodule.  6. Weight loss, improved.  7. Right knee pain/effusion status post evaluation at [facility].  8. CT abdomen/pelvis 03/11/2011 with multiple nodules at the lung bases consistent with metastatic involvement of the lungs; prominent presacral soft tissue; air bubble within the presacral soft tissue appeared to be extraluminal and breakdown at the anastomotic site could not be excluded. No discrete abscess was seen. 9. Elevated blood pressure. Question effect of Avastin. He was started on Norvasc on 06/10/2011. The blood pressure is still elevated. We increase the Norvasc to 10 mg daily. 10. Frequent bowel movements. Likely related to multiple bowel resections. Imodium was not effective. He continues to have frequent bowel movements. He was unable to obtain paregoric. 11. Suture at the ileostomy reversal scar-we will refer him to Dr. [name] for suture removal 12. Erectile dysfunction. The testosterone level returned at 508 on 08/12/2011, he will be referred to urology  Disposition:  Mr. Mogg appears stable. He will complete another cycle of FOLFIRI/Avastin today. The Norvasc dose was increased for treatment of hypertension. He will return for  an office visit and chemotherapy in 2 weeks.   Thornton Papas,  MD  08/26/2011  5:18 PM

## 2011-08-26 NOTE — Telephone Encounter (Signed)
Gave pt appt for July 2013 lab, MD and chemo, pt will see Dr. Vernie Ammons @ [facility] on 09/22/11 0900 gave referral to HIM to fax notes

## 2011-08-27 ENCOUNTER — Telehealth: Payer: Self-pay | Admitting: Oncology

## 2011-08-27 NOTE — Telephone Encounter (Signed)
Schedule complete and per earlier documentation pt given schedule. appt for 7/27 cx'd due to tx moved to 7/18. Pt on schedule for tx 7/18 w/pump d/c 7/20. 7/27 pump d/c cx'd. Not entered in 7/18 appt to have pt get new schedule.

## 2011-08-31 ENCOUNTER — Telehealth (INDEPENDENT_AMBULATORY_CARE_PROVIDER_SITE_OTHER): Payer: Self-pay | Admitting: General Surgery

## 2011-08-31 ENCOUNTER — Other Ambulatory Visit: Payer: Self-pay | Admitting: *Deleted

## 2011-08-31 ENCOUNTER — Telehealth: Payer: Self-pay | Admitting: Oncology

## 2011-08-31 NOTE — Telephone Encounter (Signed)
Notified the patient to contact the office appt was made  10/04/11 @5 :00

## 2011-08-31 NOTE — Telephone Encounter (Signed)
Clide Cliff RN left message, regarding MD and lab for 7/25

## 2011-08-31 NOTE — Telephone Encounter (Signed)
Message copied by Latricia Heft on Tue Aug 31, 2011 11:26 AM ------      Message from: Almond Lint      Created: Tue Aug 31, 2011  9:56 AM       Can we see this guy earlier?            ----- Message -----         From: Ladene Artist, MD         Sent: 08/26/2011   5:26 PM           To: Almond Lint, MD            Jeffery [name] has a retained suture at the Rt. Lower abd. Scar.        We were unable to get this out.  He called for an appt. With you and was given a Sept. Appt.            He is bothered by the suture.  Can you see him sooner?                  Thanks,            [employer]

## 2011-09-02 ENCOUNTER — Other Ambulatory Visit (HOSPITAL_BASED_OUTPATIENT_CLINIC_OR_DEPARTMENT_OTHER): Payer: Medicaid Other | Admitting: Lab

## 2011-09-02 ENCOUNTER — Ambulatory Visit (HOSPITAL_BASED_OUTPATIENT_CLINIC_OR_DEPARTMENT_OTHER): Payer: Medicaid Other

## 2011-09-02 ENCOUNTER — Other Ambulatory Visit: Payer: Self-pay | Admitting: Oncology

## 2011-09-02 VITALS — BP 138/90 | HR 100 | Temp 98.3°F

## 2011-09-02 DIAGNOSIS — C20 Malignant neoplasm of rectum: Secondary | ICD-10-CM

## 2011-09-02 DIAGNOSIS — C19 Malignant neoplasm of rectosigmoid junction: Secondary | ICD-10-CM

## 2011-09-02 DIAGNOSIS — Z5111 Encounter for antineoplastic chemotherapy: Secondary | ICD-10-CM

## 2011-09-02 DIAGNOSIS — C7800 Secondary malignant neoplasm of unspecified lung: Secondary | ICD-10-CM

## 2011-09-02 DIAGNOSIS — Z5112 Encounter for antineoplastic immunotherapy: Secondary | ICD-10-CM

## 2011-09-02 LAB — COMPREHENSIVE METABOLIC PANEL
AST: 19 U/L (ref 0–37)
Albumin: 3.1 g/dL — ABNORMAL LOW (ref 3.5–5.2)
Alkaline Phosphatase: 85 U/L (ref 39–117)
Glucose, Bld: 159 mg/dL — ABNORMAL HIGH (ref 70–99)
Potassium: 3.5 mEq/L (ref 3.5–5.3)
Sodium: 136 mEq/L (ref 135–145)
Total Bilirubin: 0.7 mg/dL (ref 0.3–1.2)
Total Protein: 7.8 g/dL (ref 6.0–8.3)

## 2011-09-02 LAB — CBC WITH DIFFERENTIAL/PLATELET
BASO%: 0.4 % (ref 0.0–2.0)
EOS%: 0.4 % (ref 0.0–7.0)
Eosinophils Absolute: 0 10*3/uL (ref 0.0–0.5)
LYMPH%: 12.2 % — ABNORMAL LOW (ref 14.0–49.0)
MCH: 32.6 pg (ref 27.2–33.4)
MCHC: 33.7 g/dL (ref 32.0–36.0)
MCV: 96.5 fL (ref 79.3–98.0)
MONO%: 12.7 % (ref 0.0–14.0)
Platelets: 320 10*3/uL (ref 140–400)
RBC: 3.83 10*6/uL — ABNORMAL LOW (ref 4.20–5.82)
RDW: 14.6 % (ref 11.0–14.6)

## 2011-09-02 MED ORDER — LEUCOVORIN CALCIUM INJECTION 350 MG
400.0000 mg/m2 | Freq: Once | INTRAVENOUS | Status: AC
  Administered 2011-09-02: 688 mg via INTRAVENOUS
  Filled 2011-09-02: qty 34.4

## 2011-09-02 MED ORDER — IRINOTECAN HCL CHEMO INJECTION 100 MG/5ML
180.0000 mg/m2 | Freq: Once | INTRAVENOUS | Status: AC
  Administered 2011-09-02: 310 mg via INTRAVENOUS
  Filled 2011-09-02: qty 15.5

## 2011-09-02 MED ORDER — ONDANSETRON 16 MG/50ML IVPB (CHCC)
16.0000 mg | Freq: Once | INTRAVENOUS | Status: AC
  Administered 2011-09-02: 16 mg via INTRAVENOUS

## 2011-09-02 MED ORDER — SODIUM CHLORIDE 0.9 % IJ SOLN
10.0000 mL | INTRAMUSCULAR | Status: DC | PRN
  Filled 2011-09-02: qty 10

## 2011-09-02 MED ORDER — FLUOROURACIL CHEMO INJECTION 2.5 GM/50ML
400.0000 mg/m2 | Freq: Once | INTRAVENOUS | Status: AC
  Administered 2011-09-02: 700 mg via INTRAVENOUS
  Filled 2011-09-02: qty 14

## 2011-09-02 MED ORDER — SODIUM CHLORIDE 0.9 % IV SOLN
2400.0000 mg/m2 | INTRAVENOUS | Status: DC
  Administered 2011-09-02: 4150 mg via INTRAVENOUS
  Filled 2011-09-02: qty 83

## 2011-09-02 MED ORDER — DEXAMETHASONE SODIUM PHOSPHATE 4 MG/ML IJ SOLN
20.0000 mg | Freq: Once | INTRAMUSCULAR | Status: AC
  Administered 2011-09-02: 20 mg via INTRAVENOUS

## 2011-09-02 MED ORDER — SODIUM CHLORIDE 0.9 % IV SOLN
5.0000 mg/kg | Freq: Once | INTRAVENOUS | Status: AC
  Administered 2011-09-02: 325 mg via INTRAVENOUS
  Filled 2011-09-02: qty 13

## 2011-09-02 MED ORDER — HEPARIN SOD (PORK) LOCK FLUSH 100 UNIT/ML IV SOLN
500.0000 [IU] | Freq: Once | INTRAVENOUS | Status: DC | PRN
  Filled 2011-09-02: qty 5

## 2011-09-02 MED ORDER — SODIUM CHLORIDE 0.9 % IV SOLN
Freq: Once | INTRAVENOUS | Status: AC
  Administered 2011-09-02: 10:00:00 via INTRAVENOUS

## 2011-09-04 ENCOUNTER — Ambulatory Visit (HOSPITAL_BASED_OUTPATIENT_CLINIC_OR_DEPARTMENT_OTHER): Payer: Medicaid Other

## 2011-09-04 VITALS — BP 151/93 | HR 101 | Temp 97.9°F

## 2011-09-04 DIAGNOSIS — C20 Malignant neoplasm of rectum: Secondary | ICD-10-CM

## 2011-09-04 DIAGNOSIS — C19 Malignant neoplasm of rectosigmoid junction: Secondary | ICD-10-CM

## 2011-09-04 DIAGNOSIS — C7800 Secondary malignant neoplasm of unspecified lung: Secondary | ICD-10-CM

## 2011-09-04 MED ORDER — SODIUM CHLORIDE 0.9 % IJ SOLN
10.0000 mL | INTRAMUSCULAR | Status: DC | PRN
  Administered 2011-09-04: 10 mL
  Filled 2011-09-04: qty 10

## 2011-09-04 MED ORDER — HEPARIN SOD (PORK) LOCK FLUSH 100 UNIT/ML IV SOLN
500.0000 [IU] | Freq: Once | INTRAVENOUS | Status: AC | PRN
  Administered 2011-09-04: 500 [IU]
  Filled 2011-09-04: qty 5

## 2011-09-08 ENCOUNTER — Telehealth: Payer: Self-pay | Admitting: *Deleted

## 2011-09-08 NOTE — Telephone Encounter (Signed)
Spoke with pt's mother. Informed her that 7/25 appts are being canceled. Schedulers will call with new appts. She stated she will tell pt.

## 2011-09-09 ENCOUNTER — Other Ambulatory Visit: Payer: Medicaid Other | Admitting: Lab

## 2011-09-09 ENCOUNTER — Ambulatory Visit: Payer: Medicaid Other | Admitting: Oncology

## 2011-09-09 ENCOUNTER — Ambulatory Visit: Payer: Medicaid Other

## 2011-09-09 ENCOUNTER — Other Ambulatory Visit: Payer: Self-pay | Admitting: *Deleted

## 2011-09-10 ENCOUNTER — Telehealth: Payer: Self-pay | Admitting: Oncology

## 2011-09-10 NOTE — Telephone Encounter (Signed)
S/w pt re appt for 8/1 @ 9:30 am.

## 2011-09-15 ENCOUNTER — Other Ambulatory Visit: Payer: Self-pay | Admitting: Oncology

## 2011-09-16 ENCOUNTER — Ambulatory Visit: Payer: Medicaid Other

## 2011-09-16 ENCOUNTER — Telehealth: Payer: Self-pay | Admitting: *Deleted

## 2011-09-16 ENCOUNTER — Ambulatory Visit (HOSPITAL_COMMUNITY): Payer: Medicaid Other

## 2011-09-16 ENCOUNTER — Encounter (HOSPITAL_COMMUNITY): Payer: Self-pay

## 2011-09-16 ENCOUNTER — Telehealth: Payer: Self-pay | Admitting: Oncology

## 2011-09-16 ENCOUNTER — Inpatient Hospital Stay (HOSPITAL_COMMUNITY)
Admission: AD | Admit: 2011-09-16 | Discharge: 2011-09-20 | DRG: 394 | Disposition: A | Discharge status: Home / Self Care | Payer: Medicaid Other | Source: Ambulatory Visit | Attending: Oncology | Admitting: Oncology

## 2011-09-16 ENCOUNTER — Ambulatory Visit (HOSPITAL_COMMUNITY)
Admission: RE | Admit: 2011-09-16 | Discharge: 2011-09-16 | Disposition: A | Discharge status: Home / Self Care | Payer: Medicaid Other | Source: Ambulatory Visit | Attending: Oncology | Admitting: Oncology

## 2011-09-16 ENCOUNTER — Other Ambulatory Visit (HOSPITAL_BASED_OUTPATIENT_CLINIC_OR_DEPARTMENT_OTHER): Payer: Medicaid Other

## 2011-09-16 ENCOUNTER — Ambulatory Visit: Payer: Medicaid Other | Admitting: Oncology

## 2011-09-16 ENCOUNTER — Inpatient Hospital Stay (HOSPITAL_COMMUNITY): Payer: Medicaid Other

## 2011-09-16 VITALS — BP 138/86 | HR 112 | Temp 99.5°F | Ht 64.0 in | Wt 140.7 lb

## 2011-09-16 DIAGNOSIS — I10 Essential (primary) hypertension: Secondary | ICD-10-CM | POA: Diagnosis present

## 2011-09-16 DIAGNOSIS — Z5111 Encounter for antineoplastic chemotherapy: Secondary | ICD-10-CM

## 2011-09-16 DIAGNOSIS — Z9221 Personal history of antineoplastic chemotherapy: Secondary | ICD-10-CM

## 2011-09-16 DIAGNOSIS — K612 Anorectal abscess: Principal | ICD-10-CM | POA: Diagnosis present

## 2011-09-16 DIAGNOSIS — Z79899 Other long term (current) drug therapy: Secondary | ICD-10-CM

## 2011-09-16 DIAGNOSIS — Q438 Other specified congenital malformations of intestine: Secondary | ICD-10-CM

## 2011-09-16 DIAGNOSIS — C20 Malignant neoplasm of rectum: Secondary | ICD-10-CM

## 2011-09-16 DIAGNOSIS — K603 Anal fistula, unspecified: Secondary | ICD-10-CM | POA: Diagnosis present

## 2011-09-16 DIAGNOSIS — Z299 Encounter for prophylactic measures, unspecified: Secondary | ICD-10-CM

## 2011-09-16 DIAGNOSIS — K611 Rectal abscess: Secondary | ICD-10-CM

## 2011-09-16 DIAGNOSIS — Z9049 Acquired absence of other specified parts of digestive tract: Secondary | ICD-10-CM

## 2011-09-16 DIAGNOSIS — K6289 Other specified diseases of anus and rectum: Secondary | ICD-10-CM

## 2011-09-16 DIAGNOSIS — F17200 Nicotine dependence, unspecified, uncomplicated: Secondary | ICD-10-CM | POA: Diagnosis present

## 2011-09-16 DIAGNOSIS — IMO0001 Reserved for inherently not codable concepts without codable children: Secondary | ICD-10-CM

## 2011-09-16 DIAGNOSIS — C7800 Secondary malignant neoplasm of unspecified lung: Secondary | ICD-10-CM | POA: Diagnosis present

## 2011-09-16 DIAGNOSIS — C19 Malignant neoplasm of rectosigmoid junction: Secondary | ICD-10-CM

## 2011-09-16 DIAGNOSIS — R197 Diarrhea, unspecified: Secondary | ICD-10-CM

## 2011-09-16 LAB — CBC WITH DIFFERENTIAL/PLATELET
Basophils Absolute: 0 10*3/uL (ref 0.0–0.1)
EOS%: 0.2 % (ref 0.0–7.0)
HGB: 11.3 g/dL — ABNORMAL LOW (ref 13.0–17.1)
LYMPH%: 12.3 % — ABNORMAL LOW (ref 14.0–49.0)
MCH: 31.8 pg (ref 27.2–33.4)
MCV: 94.7 fL (ref 79.3–98.0)
MONO%: 16.1 % — ABNORMAL HIGH (ref 0.0–14.0)
NEUT%: 71 % (ref 39.0–75.0)
Platelets: 304 10*3/uL (ref 140–400)
RDW: 14.5 % (ref 11.0–14.6)

## 2011-09-16 LAB — COMPREHENSIVE METABOLIC PANEL
AST: 23 U/L (ref 0–37)
Alkaline Phosphatase: 85 U/L (ref 39–117)
Alkaline Phosphatase: 76 U/L (ref 39–117)
BUN: 13 mg/dL (ref 6–23)
BUN: 14 mg/dL (ref 6–23)
Calcium: 8.3 mg/dL — ABNORMAL LOW (ref 8.4–10.5)
Creatinine, Ser: 0.83 mg/dL (ref 0.50–1.35)
Creatinine, Ser: 0.98 mg/dL (ref 0.50–1.35)
GFR calc Af Amer: >90 mL/min (ref >90)
Glucose, Bld: 116 mg/dL — ABNORMAL HIGH (ref 70–99)
Potassium: 3.3 mEq/L — ABNORMAL LOW (ref 3.5–5.3)
Total Bilirubin: 0.6 mg/dL (ref 0.3–1.2)
Total Protein: 7.1 g/dL (ref 6.0–8.3)

## 2011-09-16 MED ORDER — PAREGORIC 2 MG/5ML PO TINC: 5.0000 mL | Freq: Four times a day (QID) | ORAL | Status: DC | PRN

## 2011-09-16 MED ORDER — LIDOCAINE-PRILOCAINE 2.5-2.5 % EX CREA
TOPICAL_CREAM | Freq: Once | CUTANEOUS | Status: AC
  Administered 2011-09-16: 16:00:00 via TOPICAL
  Filled 2011-09-16: qty 5

## 2011-09-16 MED ORDER — AMLODIPINE BESYLATE 10 MG PO TABS
10.0000 mg | ORAL_TABLET | Freq: Every day | ORAL | Status: DC
  Administered 2011-09-17 – 2011-09-20 (×4): 10 mg via ORAL
  Filled 2011-09-16 (×5): qty 1

## 2011-09-16 MED ORDER — PROCHLORPERAZINE MALEATE 10 MG PO TABS: 10.0000 mg | ORAL_TABLET | Freq: Four times a day (QID) | ORAL | Status: DC | PRN

## 2011-09-16 MED ORDER — IOHEXOL 300 MG/ML  SOLN
100.0000 mL | Freq: Once | INTRAMUSCULAR | Status: AC | PRN
  Administered 2011-09-16: 100 mL via INTRAVENOUS

## 2011-09-16 MED ORDER — ENOXAPARIN SODIUM 40 MG/0.4ML ~~LOC~~ SOLN
40.0000 mg | SUBCUTANEOUS | Status: DC
  Administered 2011-09-16: 40 mg via SUBCUTANEOUS
  Filled 2011-09-16 (×2): qty 0.4

## 2011-09-16 MED ORDER — PIPERACILLIN-TAZOBACTAM 3.375 G IVPB
3.3750 g | Freq: Three times a day (TID) | INTRAVENOUS | Status: DC
  Administered 2011-09-16 – 2011-09-20 (×11): 3.375 g via INTRAVENOUS
  Filled 2011-09-16 (×13): qty 50

## 2011-09-16 MED ORDER — OXYCODONE HCL 5 MG PO TABS
5.0000 mg | ORAL_TABLET | ORAL | Status: DC | PRN
  Administered 2011-09-16 – 2011-09-20 (×11): 5 mg via ORAL
  Filled 2011-09-16 (×11): qty 1

## 2011-09-16 MED ORDER — MORPHINE SULFATE 2 MG/ML IJ SOLN
INTRAMUSCULAR | Status: AC
  Filled 2011-09-16: qty 1

## 2011-09-16 MED ORDER — SODIUM CHLORIDE 0.9 % IV SOLN
INTRAVENOUS | Status: DC
  Administered 2011-09-16: 18:00:00 via INTRAVENOUS

## 2011-09-16 MED ORDER — ACETAMINOPHEN 325 MG PO TABS
650.0000 mg | ORAL_TABLET | Freq: Four times a day (QID) | ORAL | Status: DC | PRN
  Administered 2011-09-16 – 2011-09-17 (×2): 650 mg via ORAL
  Filled 2011-09-16 (×2): qty 2

## 2011-09-16 MED ORDER — MORPHINE SULFATE 2 MG/ML IJ SOLN
2.0000 mg | INTRAMUSCULAR | Status: DC | PRN
  Administered 2011-09-16: 2 mg via INTRAVENOUS

## 2011-09-16 NOTE — H&P (Signed)
Patient History and Physical   Jeffery [name] [id number] 06/03/1960 51 y.o. 2011-09-24    Patient Identification: Metastatic rectal cancer, admitted with pain at the right but not with CT scan evidence of a rectal leak  HPI:  Jeffery [name] was diagnosed with rectal cancer in 2011. He was treated with neoadjuvant radiation and underwent a laparoscopic assisted low anterior resection with a diverting loop ileostomy on 01/14/2010.  The final pathology revealed invasive adenocarcinoma extending into the perirectal connective tissue with negative resection margins. He began adjuvant CAPOX chemotherapy 03/11/2010 and completed a final cycle on 06/25/2010.  In January 2013 a CT confirmed multiple nodules at the lung bases consistent with metastatic disease. A biopsy on 04/20/2011 revealed metastatic adenocarcinoma consistent with a colorectal primary.  He has been treated with FOLFIRI/Avastin since March of 2013. A restaging CT on 07/22/2011 after 6 cycles of FOLFIRI/Avastin refill a slight decrease in the size of pulmonary nodules.  He has completed 9 treatments with FOLFIRI/Avastin today. Chemotherapy was last given on 09/02/2011.  He presented to the office for a scheduled visit today. He complains of pain in the right buttock for the past 2 weeks. It is difficult to sit on the right side. There is also pain with ambulation. An urgent noncontrast CT of the pelvis on 09/24/2011 reveals evidence of a perirectal gas collection involving the gluteal musculature. There is a potential communication between this area and the distal bowel.  PMH:  Past Medical History  Diagnosis Date  .  erectile dysfunction    . Cancer  2011     rectal   . Hypertension   .   history of a right knee effusion     Past Surgical History  Procedure Date  . Colon surgery 12/11    sbo  . Colon surgery 8/12    colon resection-iliostomy  . Colon surgery 2012    iliostomy takedown  . Multiple tooth extractions    9/11  . Portacath placement 04/12/2011    Procedure: INSERTION PORT-A-CATH;  Surgeon: Almond Lint, MD;  Location: [facility];  Service: General;  Laterality: N/A;  port a cath insertion    Allergies:  No Known Allergies  Medications: See electronic record Social History:  He works as a Financial risk analyst. He smokes cigarettes. He drinks alcohol. No risk factor for HIV or hepatitis  Family History:  Family History  Problem Relation Age of Onset  . Cancer Mother      head and neck     Review of Systems:  Positives include: Frequent bowel movements, rectal bleeding for the past 2 weeks, pain at the right gluteal area  A complete ROS was otherwise negative.   Physical Exam:   HEENT: No thrush or ulcers Lungs: Lungs clear bilaterally Cardiac: Regular rate and rhythm Abdomen: Nontender, no hepatosplenomegaly, no mass, retained suture in the right low abdomen incision  Vascular: No leg edema Neurologic: Alert and oriented, the leg strength is intact bilaterally Skin: No rash Musculoskeletal: He is tender over the right gluteus, no erythema, fluctuance, or mass Rectal: Normal tone, brown stool on examination glove, no fluctuance, no mass, radiation hyperpigmentation at the perineum Lab Results:  Lab Results  Component Value Date   WBC 5.7 2011-09-24   HGB 11.3* September 24, 2011   HCT 33.8* September 24, 2011   MCV 94.7 09/24/2011   PLT 304 09-24-2011   ANC 4.0    Radiological Studies:  Ct Pelvis Wo Contrast  Sep 24, 2011  *RADIOLOGY REPORT*  Clinical Data:  History  of rectal cancer with prior surgical resection and ongoing radiation.  Right buttock pain.  CT PELVIS WITHOUT CONTRAST  Technique:  Multidetector CT imaging of the pelvis was performed following the standard protocol without intravenous contrast.  Comparison:  03/11/2011  Findings:  Surgical changes are noted in the region of the rectum. Near the surgical clips, gas extends from the lumen of the rectum into the right perirectal soft  tissues along the right pelvic sidewall.  This is concerning for anastomotic breakdown.  There is rectal wall thickening which may be related to radiation changes. No free air in the visualized peritoneum.  Surgical changes also seen in the right lower quadrant bowel loops.  No free fluid in the pelvis.  Urinary bladder is partially decompressed, grossly unremarkable.  No acute or focal bony abnormality.  IMPRESSION: Gas extending from the rectum through the thickened rectal wall into the right perirectal soft tissues concerning for anastomotic breakdown.  This area may be better evaluated with CT with oral and IV contrast if felt clinically indicated.  Rectal wall thickening, possibly related to radiation changes.  These results were discussed with Dr. Truett Perna at the time of interpretation.  Original Report Authenticated By: Cyndie Chime, M.D.     Impression and Plan:  1. Metastatic rectal cancer involving multiple lung nodule-currently being treated with FOLFIRI/Avastin chemotherapy, last given on 09/02/2011.  2. Pain at the right gluteus-this appears to be related to a gas collection in the right perirectal soft tissues,? Fistula or anastomotic breakdown  Jeffery [name] is being treated with systemic therapy for metastatic rectal cancer. He presents today with pain at the right gluteal region with CT scan evidence of a gas collection in the right perirectal soft tissues. I reviewed the CT scan with a [name] radiologist and it Dr. [name]. There is concern of a rectal fistula or anastomotic breakdown. This could be related to locally recurrent tumor or Avastin therapy.  He will be admitted for supportive care measures and additional diagnostic evaluation. Dr. [name] and the GI service will be consulted. The scheduled chemotherapy was held today.     Thornton Papas, MD 09/16/2011, 1:59 PM

## 2011-09-16 NOTE — Telephone Encounter (Signed)
Pt sent to ct for stat scan and was advised to come back after scan and get

## 2011-09-16 NOTE — Telephone Encounter (Signed)
Called Admitting/[facility] for pt admission today for met. Rectal CA with pelvic abscess & pain.  [facility] notified of admission.

## 2011-09-16 NOTE — Progress Notes (Signed)
Patient seen in the office today with right gluteal pain. A CT revealed evidence of a perirectal  gas collection. He is admitted for further evaluation and management.

## 2011-09-17 DIAGNOSIS — C20 Malignant neoplasm of rectum: Secondary | ICD-10-CM

## 2011-09-17 DIAGNOSIS — K611 Rectal abscess: Secondary | ICD-10-CM

## 2011-09-17 DIAGNOSIS — R509 Fever, unspecified: Secondary | ICD-10-CM

## 2011-09-17 DIAGNOSIS — K612 Anorectal abscess: Principal | ICD-10-CM

## 2011-09-17 MED ORDER — SODIUM CHLORIDE 0.9 % IV SOLN
INTRAVENOUS | Status: DC
  Administered 2011-09-17 – 2011-09-20 (×4): via INTRAVENOUS
  Filled 2011-09-17 (×5): qty 1000

## 2011-09-17 MED ORDER — ENOXAPARIN SODIUM 40 MG/0.4ML ~~LOC~~ SOLN
40.0000 mg | SUBCUTANEOUS | Status: DC
  Administered 2011-09-17 – 2011-09-19 (×3): 40 mg via SUBCUTANEOUS
  Filled 2011-09-17 (×4): qty 0.4

## 2011-09-17 NOTE — Progress Notes (Signed)
UR done. 

## 2011-09-17 NOTE — Consult Note (Signed)
Referring Provider: Ladene Artist, MD Primary Care Physician:  No primary provider on file. Primary Gastroenterologist:  Dr.Kaplan  Reason for Consultation:  Rectal cancer- r/o recurrence/perirectal  abscess  HPI: Jeffery [name] is a 51 y.o. male who unfortunately was diagnosed with an adenocarcinoma of the rectum in 2011. Colonoscopy was done by Dr. Arlyce Dice. He underwent neoadjuvant radiation and then laparoscopic-assisted low anterior resection with a diverting loop ileostomy in November of 2011 per Dr. [name]. He underwent chemotherapy from at January 2012 through May of 2012, and then was found on imaging in January of 2013 to have multiple lung nodules consistent with metastatic disease. He has been on Avastin and FOLFIR since March of 2013. He agrees staging CT scan done in June which showed a slight decrease in size and the pulmonary nodules. He has continued with chemotherapy his last session was about 14 days ago on July 18. He was seen by Dr. Truett Perna in his office yesterday for regular followup and at that time city been having some pain in the right side of his buttocks internally over the past couple of weeks. The pain had not really progressed but had been constant and he rates it as a 5/10. He said it had become difficult to sit on the right side of his buttocks. Been unaware of any fever chills or sweats. His bowel movements have been regular he has not noted any melena or hematochezia. After being examined in Dr. Kalman Drape office yesterday he started having some rectal bleeding with oozing of blood which has persisted since. He says his appetite has been fine has no complaints of abdominal pain, and no nausea or vomiting. Since admission last night and he did spike a temp to 102 , and had sweats. He has been started on IV Zosyn. WBC on 8/1  Was 5.7 The patient had noncontrasted been a contrasted CT scan of the abdomen and pelvis yesterday which shows rectal wall thickening on the right  and multiple locules of extraluminal gas with tracking into the right pelvic sidewall. This is felt consistent with fistula/abscess. It is unclear whether the rectal wall thickening is inflammatory versus recurrent tumor   Past Medical History  Diagnosis Date  . No pertinent past medical history   . Cancer     rectal   . Hypertension     no meds    Past Surgical History  Procedure Date  . Colon surgery 12/11    sbo  . Colon surgery 8/12    colon resection-iliostomy  . Colon surgery 2012    iliostomy takedown  . Multiple tooth extractions     9/11  . Portacath placement 04/12/2011    Procedure: INSERTION PORT-A-CATH;  Surgeon: Almond Lint, MD;  Location: [facility];  Service: General;  Laterality: N/A;  port a cath insertion    Prior to Admission medications   Medication Sig Start Date End Date Taking? Authorizing Provider  amLODipine (NORVASC) 5 MG tablet Take 2 tablets (10 mg total) by mouth daily. 08/26/11 08/25/12 Yes Ladene Artist, MD  lidocaine-prilocaine (EMLA) cream Apply topically as needed. Apply to [facility] site 1-2 hours prior to stick and cover with plastic wrap 04/14/11  Yes Ladene Artist, MD  paregoric 2 MG/5ML solution Take 5 mLs by mouth 4 (four) times daily as needed. #141ml -- 08/13/11  Yes Rana Snare, NP  prochlorperazine (COMPAZINE) 10 MG tablet Take 1 tablet (10 mg total) by mouth every 6 (six) hours as needed (  nausea). 04/14/11  Yes Ladene Artist, MD    Current Facility-Administered Medications  Medication Dose Route Frequency Provider Last Rate Last Dose  . 0.9 %  sodium chloride infusion   Intravenous Continuous Ladene Artist, MD 50 mL/hr at 09/16/11 1743    . acetaminophen (TYLENOL) tablet 650 mg  650 mg Oral Q6H PRN Ladene Artist, MD   650 mg at 09/16/11 2314  . amLODipine (NORVASC) tablet 10 mg  10 mg Oral Daily Ladene Artist, MD      . enoxaparin (LOVENOX) injection 40 mg  40 mg Subcutaneous Q24H Ladene Artist, MD   40 mg at  09/16/11 1833  . iohexol (OMNIPAQUE) 300 MG/ML solution 100 mL  100 mL Intravenous Once PRN Medication Radiologist, MD   100 mL at 09/16/11 2028  . lidocaine-prilocaine (EMLA) cream   Topical Once Ladene Artist, MD      . morphine 2 MG/ML injection 2-4 mg  2-4 mg Intravenous Q4H PRN Ladene Artist, MD   2 mg at 09/16/11 1839  . morphine 2 MG/ML injection           . oxyCODONE (Oxy IR/ROXICODONE) immediate release tablet 5 mg  5 mg Oral Q4H PRN Ladene Artist, MD   5 mg at 09/17/11 [id number]  . paregoric 2 MG/5ML solution 5 mL  5 mL Oral QID PRN Ladene Artist, MD      . piperacillin-tazobactam (ZOSYN) IVPB 3.375 g  3.375 g Intravenous Q8H Ladene Artist, MD   3.375 g at 09/17/11 0134  . prochlorperazine (COMPAZINE) tablet 10 mg  10 mg Oral Q6H PRN Ladene Artist, MD        Allergies as of 09/16/2011  . (No Known Allergies)    Family History  Problem Relation Age of Onset  . Cancer Mother     lung    History   Social History  . Marital Status: Single    Spouse Name: N/A    Number of Children: N/A  . Years of Education: N/A   Occupational History  . Not on file.   Social History Main Topics  . Smoking status: Current Everyday Smoker -- 0.2 packs/day    Types: Cigarettes  . Smokeless tobacco: Never Used  . Alcohol Use: Yes     6 pk a daily  . Drug Use: No  . Sexually Active: No   Other Topics Concern  . Not on file   Social History Narrative  . No narrative on file    Review of Systems: Pertinent positive and negative review of systems were noted in the above HPI section.  All other review of systems was otherwise negative.  Physical Exam: Vital signs in last 24 hours: Temp:  [98.5 F (36.9 C)-102.4 F (39.1 C)] 99 F (37.2 C) (08/02 0630) Pulse Rate:  [100-112] 100  (08/02 0630) Resp:  [16-18] 18  (08/02 0630) BP: (137-148)/(77-89) 137/89 mmHg (08/02 0630) SpO2:  [91 %-94 %] 94 % (08/02 0630) Weight:  [139 lb 7 oz (63.248 kg)-140 lb 11.2 oz (63.821 kg)]  139 lb 7 oz (63.248 kg) (08/01 1520) Last BM Date: 09/16/11 General:   Alert,  Well-developed, well-nourished, pleasant and cooperative in NAD Head:  Normocephalic and atraumatic. Eyes:  Sclera clear, no icterus.   Conjunctiva pink. Ears:  Normal auditory acuity. Nose:  No deformity, discharge,  or lesions. Mouth:  No deformity or lesions.   Neck:  Supple; no masses or thyromegaly. Lungs:  Clear throughout to auscultation.   No wheezes, crackles, or rhonchi. Heart:  Regular rate and rhythm; no murmurs, clicks, rubs,  or gallops. Abdomen:  Soft,nontender, BS active,no palp mass or hsm,incisional scars  Rectal:  Deferred  Msk:  Symmetrical without gross deformities. . Pulses:  Normal pulses noted. Extremities:  Without clubbing or edema. Neurologic:  Alert and  oriented x4;  grossly normal neurologically. Skin:  Intact without significant lesions or rashes.. Psych:  Alert and cooperative. Normal mood and affect.  Intake/Output from previous day: 08/01 0701 - 08/02 0700 In: 1030 [P.O.:360; I.V.:479; IV Piggyback:191] Out: -  Intake/Output this shift:    Lab Results:  [facility] 09/16/11 0940  WBC 5.7  HGB 11.3*  HCT 33.8*  PLT 304   BMET  Basename 09/16/11 2300 09/16/11 0939  NA 135 135  K 3.4* 3.3*  CL 97 97  CO2 29 29  GLUCOSE 116* 123*  BUN 14 13  CREATININE 0.98 0.83  CALCIUM 8.3* 8.6   LFT  Basename 09/16/11 2300  PROT 7.1  ALBUMIN 2.8*  AST 22  ALT 11  ALKPHOS 76  BILITOT 0.8  BILIDIR --  IBILI --    Studies/Results: Ct Pelvis Wo Contrast  09/16/2011  *RADIOLOGY REPORT*  Clinical Data:  History of rectal cancer with prior surgical resection and ongoing radiation.  Right buttock pain.  CT PELVIS WITHOUT CONTRAST  Technique:  Multidetector CT imaging of the pelvis was performed following the standard protocol without intravenous contrast.  Comparison:  03/11/2011  Findings:  Surgical changes are noted in the region of the rectum. Near the surgical clips, gas  extends from the lumen of the rectum into the right perirectal soft tissues along the right pelvic sidewall.  This is concerning for anastomotic breakdown.  There is rectal wall thickening which may be related to radiation changes. No free air in the visualized peritoneum.  Surgical changes also seen in the right lower quadrant bowel loops.  No free fluid in the pelvis.  Urinary bladder is partially decompressed, grossly unremarkable.  No acute or focal bony abnormality.  IMPRESSION: Gas extending from the rectum through the thickened rectal wall into the right perirectal soft tissues concerning for anastomotic breakdown.  This area may be better evaluated with CT with oral and IV contrast if felt clinically indicated.  Rectal wall thickening, possibly related to radiation changes.  These results were discussed with Dr. Truett Perna at the time of interpretation.  Original Report Authenticated By: Cyndie Chime, M.D.   Ct Abdomen Pelvis W Contrast  09/16/2011  *RADIOLOGY REPORT*  Clinical Data: Question of rectal fistula/abscess.  History of rectal cancer in 2011.  The patient is on chemotherapy. History of colon surgery.  CT ABDOMEN AND PELVIS WITH CONTRAST  Technique:  Multidetector CT imaging of the abdomen and pelvis was performed following the standard protocol during bolus administration of intravenous contrast.  Contrast: OMNIPAQUE IOHEXOL 300 MG/ML  SOLN  Comparison: CT of the abdomen pelvis 03/11/2011  Findings: Numerous small pulmonary nodules identified at the bases. Accounting for differences in slice position, these are probably stable.  No pleural effusions or infiltrates are identified. No focal abnormality identified within the liver, spleen, pancreas, adrenal glands, or left kidney.  Right anterior renal cyst measures 1.6 cm.  The stomach and small bowel loops are normal appearance.  There is significant stool throughout colonic loops.  There is rectal wall thickening and multiple locules of  extraluminal gas, extending to the right of the rectum in  the perirectal soft tissues and tracking superiorly in the pelvic sidewall.  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 The collection appears slightly decompressed compared with noncontrast exam performed earlier today.  No air fluid level identified on the current study.  Surgical clips are identified within the region of the rectum, consistent with prior anastomosis.  No regional adenopathy.  There is pneumonia  IMPRESSION:  1.  Rectal wall thickening, possibly postoperative.  Tumor recurrence is not excluded. 2.  Soft tissue gas tracking along the right lateral aspect of the rectum, consistent with fistula or abscess. 3.  Extensive amount stool within the colon, suggesting relative obstruction or functional obstruction from the rectal process described above. 4.  Multiple pulmonary nodules consistent with metastases.  Original Report Authenticated By: Patterson Hammersmith, M.D.    IMPRESSION:  #53 51 year old male with adenocarcinoma of the rectum diagnosed 2011, status post neoadjuvant radiation followed by lap assisted low anterior resection and diverting loop ileostomy. Ostomy has been reversed. Patient found to have lung metastases in January of 2013, and started chemotherapy with Avastin and FOLFIR in March of 2013. He continues with ongoing chemotherapy last treatment about 14 days ago-now presenting with two-week history of right perirectal pain and new onset of fever yesterday. CT findings of right rectal wall thickening and extraluminal gas tracking into the right pelvic sidewall, concerning for fistulization and abscess. Recurrence of rectal tumor also felt possible. Initial CT raised the possibility of breakdown at his anastomosis.  PLAN: #1 Patient was placed on Lovenox will stop for now #2 continue IV Zosyn #3 flexible sigmoidoscopy is being considered, will discuss. He is at increased risk for perforation given CT findings   Amy  Esterwood  09/17/2011, 8:58 AM      ________________________________________________________________________  Corinda [facility] MD note:  I personally examined the patient, reviewed the data and agree with the assessment and plan described above.  He had fever to 102 this AM.  Still very sore right side buttocks, rectum.  External exam (very painful to insert finger) showed blood tinged, purulent appearing drainage,  Right perianal are quite tender but not fluctuant.  I think he has breakdown, perforation at rectum.  He may also have recurrent local cancer.  I don't think that flex sig (with enemas and then inssuflated air is a good idea right now to answer the question of whether there is recurrent local disease (could make perforation worse with rectal instrumentation).  He does not want a colostomy but I explained that diverting the stool will probably be needed to resolve this infection.  I discussed with Dr. Truett Perna.  Plan is to keep him on IV abx at least through the weekend.  Goal will be to let him go home early next week on PO abx and meet with Dr. Alvira Monday as outpt to consider colostomy.      Rob Bunting, MD [facility] Gastroenterology Pager [phone]

## 2011-09-17 NOTE — Progress Notes (Signed)
Patient ID: Jeffery [name], male   DOB: 03/31/1960, 51 y.o.   MRN: [id number] Pt scheduled for CT guided drainage of perirectal abscess on 8/3. Hx noted of metastatic rectal carcinoma, s/p recent LAR with diverting loop ileostomy(reversed) and CT findings of perirectal abscess with possible fistula/? Tumor recurrence. Exam- Chest - CTA bilat, heart- RRR, abd soft, +BS,NT.   Filed Vitals:   09/17/11 0040 09/17/11 0630 09/17/11 0902 09/17/11 1345  BP:  137/89 147/91 145/74  Pulse:  100  108  Temp: 98.5 F (36.9 C) 99 F (37.2 C)  102.2 F (39 C)  TempSrc: Oral Oral  Oral  Resp:  18  20  Height:      Weight:      SpO2:  94%  96%   Past Medical History  Diagnosis Date  . No pertinent past medical history   . Cancer     rectal   . Hypertension     no meds   Past Surgical History  Procedure Date  . Colon surgery 12/11    sbo  . Colon surgery 8/12    colon resection-iliostomy  . Colon surgery 2012    iliostomy takedown  . Multiple tooth extractions     9/11  . Portacath placement 04/12/2011    Procedure: INSERTION PORT-A-CATH;  Surgeon: Almond Lint, MD;  Location: [facility];  Service: General;  Laterality: N/A;  port a cath insertion   Results for orders placed during the hospital encounter of 09/16/11  COMPREHENSIVE METABOLIC PANEL      Component Value Range   Sodium 135  135 - 145 mEq/L   Potassium 3.4 (*) 3.5 - 5.1 mEq/L   Chloride 97  96 - 112 mEq/L   CO2 29  19 - 32 mEq/L   Glucose, Bld 116 (*) 70 - 99 mg/dL   BUN 14  6 - 23 mg/dL   Creatinine, Ser 4.09  0.50 - 1.35 mg/dL   Calcium 8.3 (*) 8.4 - 10.5 mg/dL   Total Protein 7.1  6.0 - 8.3 g/dL   Albumin 2.8 (*) 3.5 - 5.2 g/dL   AST 22  0 - 37 U/L   ALT 11  0 - 53 U/L   Alkaline Phosphatase 76  39 - 117 U/L   Total Bilirubin 0.8  0.3 - 1.2 mg/dL   GFR calc non Af Amer >90  >90 mL/min   GFR calc Af Amer >90  >90 mL/min   Ct Pelvis Wo Contrast  09/16/2011  *RADIOLOGY REPORT*  Clinical Data:  History of  rectal cancer with prior surgical resection and ongoing radiation.  Right buttock pain.  CT PELVIS WITHOUT CONTRAST  Technique:  Multidetector CT imaging of the pelvis was performed following the standard protocol without intravenous contrast.  Comparison:  03/11/2011  Findings:  Surgical changes are noted in the region of the rectum. Near the surgical clips, gas extends from the lumen of the rectum into the right perirectal soft tissues along the right pelvic sidewall.  This is concerning for anastomotic breakdown.  There is rectal wall thickening which may be related to radiation changes. No free air in the visualized peritoneum.  Surgical changes also seen in the right lower quadrant bowel loops.  No free fluid in the pelvis.  Urinary bladder is partially decompressed, grossly unremarkable.  No acute or focal bony abnormality.  IMPRESSION: Gas extending from the rectum through the thickened rectal wall into the right perirectal soft tissues concerning for anastomotic breakdown.  This area may be better evaluated with CT with oral and IV contrast if felt clinically indicated.  Rectal wall thickening, possibly related to radiation changes.  These results were discussed with Dr. Truett Perna at the time of interpretation.  Original Report Authenticated By: Cyndie Chime, M.D.   Ct Abdomen Pelvis W Contrast  09/16/2011  *RADIOLOGY REPORT*  Clinical Data: Question of rectal fistula/abscess.  History of rectal cancer in 2011.  The patient is on chemotherapy. History of colon surgery.  CT ABDOMEN AND PELVIS WITH CONTRAST  Technique:  Multidetector CT imaging of the abdomen and pelvis was performed following the standard protocol during bolus administration of intravenous contrast.  Contrast: OMNIPAQUE IOHEXOL 300 MG/ML  SOLN  Comparison: CT of the abdomen pelvis 03/11/2011  Findings: Numerous small pulmonary nodules identified at the bases. Accounting for differences in slice position, these are probably stable.   No pleural effusions or infiltrates are identified. No focal abnormality identified within the liver, spleen, pancreas, adrenal glands, or left kidney.  Right anterior renal cyst measures 1.6 cm.  The stomach and small bowel loops are normal appearance.  There is significant stool throughout colonic loops.  There is rectal wall thickening and multiple locules of extraluminal gas, extending to the right of the rectum in the perirectal soft tissues and tracking superiorly in the pelvic sidewall.  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 The collection appears slightly decompressed compared with noncontrast exam performed earlier today.  No air fluid level identified on the current study.  Surgical clips are identified within the region of the rectum, consistent with prior anastomosis.  No regional adenopathy.  There is pneumonia  IMPRESSION:  1.  Rectal wall thickening, possibly postoperative.  Tumor recurrence is not excluded. 2.  Soft tissue gas tracking along the right lateral aspect of the rectum, consistent with fistula or abscess. 3.  Extensive amount stool within the colon, suggesting relative obstruction or functional obstruction from the rectal process described above. 4.  Multiple pulmonary nodules consistent with metastases.  Original Report Authenticated By: Patterson Hammersmith, M.D.         A/P:Pt with hx metastatic rectal carcinoma, s/p LAR with loop ileostomy(reversed) and evidence of perirectal abscess with possible fistula on recent CT; plan is for CT guided drainage of abscess on 8/3. Details/risks of above d/w pt with his understanding and consent.

## 2011-09-17 NOTE — Progress Notes (Signed)
Pt has temp of 102.4 with pulse of 105 other VS WNL.  Notified MD.  Did not received any new orders.  Will continue to monitor pt. Jeffery [name] Stem 7:29 AM 09/17/2011

## 2011-09-17 NOTE — Progress Notes (Addendum)
IP PROGRESS NOTE  Subjective:   He continues to have pain at the right gluteus. No new complaint.  Objective: Vital signs in last 24 hours: Blood pressure 147/91, pulse 100, temperature 99 F (37.2 C), temperature source Oral, resp. rate 18, height 5\' 4"  (1.626 m), weight 139 lb 7 oz (63.248 kg), SpO2 94.00%. MAXIMUM TEMPERATURE-102.4  Intake/Output from previous day: 08/01 0701 - 08/02 0700 In: 1030 [P.O.:360; I.V.:479; IV Piggyback:191] Out: -   Physical Exam:  HEENT: No thrush Lungs: End inspiratory rails at the bases bilaterally, no respiratory distress Cardiac: Regular rate and rhythm Abdomen: No hepatomegaly, nontender Extremities: No leg edema Muscle skeletal: Tender over the right gluteus, no mass or rash  Portacath/PICC-without erythema  Lab Results:  [facility] 09/16/11 0940  WBC 5.7  HGB 11.3*  HCT 33.8*  PLT 304    BMET  Basename 09/16/11 2300 09/16/11 0939  NA 135 135  K 3.4* 3.3*  CL 97 97  CO2 29 29  GLUCOSE 116* 123*  BUN 14 13  CREATININE 0.98 0.83  CALCIUM 8.3* 8.6    Studies/Results: Ct Pelvis Wo Contrast  09/16/2011  *RADIOLOGY REPORT*  Clinical Data:  History of rectal cancer with prior surgical resection and ongoing radiation.  Right buttock pain.  CT PELVIS WITHOUT CONTRAST  Technique:  Multidetector CT imaging of the pelvis was performed following the standard protocol without intravenous contrast.  Comparison:  03/11/2011  Findings:  Surgical changes are noted in the region of the rectum. Near the surgical clips, gas extends from the lumen of the rectum into the right perirectal soft tissues along the right pelvic sidewall.  This is concerning for anastomotic breakdown.  There is rectal wall thickening which may be related to radiation changes. No free air in the visualized peritoneum.  Surgical changes also seen in the right lower quadrant bowel loops.  No free fluid in the pelvis.  Urinary bladder is partially decompressed, grossly  unremarkable.  No acute or focal bony abnormality.  IMPRESSION: Gas extending from the rectum through the thickened rectal wall into the right perirectal soft tissues concerning for anastomotic breakdown.  This area may be better evaluated with CT with oral and IV contrast if felt clinically indicated.  Rectal wall thickening, possibly related to radiation changes.  These results were discussed with Dr. Truett Perna at the time of interpretation.  Original Report Authenticated By: Cyndie Chime, M.D.   Ct Abdomen Pelvis W Contrast  09/16/2011  *RADIOLOGY REPORT*  Clinical Data: Question of rectal fistula/abscess.  History of rectal cancer in 2011.  The patient is on chemotherapy. History of colon surgery.  CT ABDOMEN AND PELVIS WITH CONTRAST  Technique:  Multidetector CT imaging of the abdomen and pelvis was performed following the standard protocol during bolus administration of intravenous contrast.  Contrast: OMNIPAQUE IOHEXOL 300 MG/ML  SOLN  Comparison: CT of the abdomen pelvis 03/11/2011  Findings: Numerous small pulmonary nodules identified at the bases. Accounting for differences in slice position, these are probably stable.  No pleural effusions or infiltrates are identified. No focal abnormality identified within the liver, spleen, pancreas, adrenal glands, or left kidney.  Right anterior renal cyst measures 1.6 cm.  The stomach and small bowel loops are normal appearance.  There is significant stool throughout colonic loops.  There is rectal wall thickening and multiple locules of extraluminal gas, extending to the right of the rectum in the perirectal soft tissues and tracking superiorly in the pelvic sidewall.  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  The collection appears slightly decompressed compared with noncontrast exam performed earlier today.  No air fluid level identified on the current study.  Surgical clips are identified within the region of the rectum, consistent with prior  anastomosis.  No regional adenopathy.  There is pneumonia  IMPRESSION:  1.  Rectal wall thickening, possibly postoperative.  Tumor recurrence is not excluded. 2.  Soft tissue gas tracking along the right lateral aspect of the rectum, consistent with fistula or abscess. 3.  Extensive amount stool within the colon, suggesting relative obstruction or functional obstruction from the rectal process described above. 4.  Multiple pulmonary nodules consistent with metastases.  Original Report Authenticated By: Patterson Hammersmith, M.D.    Medications: I have reviewed the patient's current medications.  Assessment/Plan:  1. Metastatic rectal cancer involving multiple lung nodules-currently being treated with FOLFIRI/Avastin chemotherapy, last given on 09/02/2011  2. Rectal fistula/perirectal abscess-he appears to have breakdown of the rectal wall,? Related to recurrent tumor,? Secondary to an anastomotic leak,? Complication of Avastin. I have discussed the case with Dr. [name] and Dr. Christella Hartigan. He will likely need to undergo a colostomy procedure. Dr. Christella Hartigan would like to delay an endoscopy due to the current infection.  3. Pain secondary to the perirectal abscess  Mr. Bartoli has a fever and persistent pain. He will continue IV and a box over the next several days. I will discuss the case with Dr. [name]. Her initial impression is to delay surgery for several weeks due to the recent Avastin therapy. If he remains afebrile and stable we will consider discharging him to home early next week on oral antibiotics. Plan: 1. Continue Zosyn 2. Add Lovenox for DVT prophylaxis 3. Add potassium to IV fluids, followup bmet on 09/18/2011   LOS: 1 day   Deidre Carino  09/17/2011, 12:42 PM

## 2011-09-18 ENCOUNTER — Inpatient Hospital Stay (HOSPITAL_COMMUNITY): Payer: Medicaid Other

## 2011-09-18 DIAGNOSIS — C20 Malignant neoplasm of rectum: Secondary | ICD-10-CM

## 2011-09-18 DIAGNOSIS — K612 Anorectal abscess: Secondary | ICD-10-CM

## 2011-09-18 DIAGNOSIS — L0291 Cutaneous abscess, unspecified: Secondary | ICD-10-CM

## 2011-09-18 DIAGNOSIS — L0390 Cellulitis, unspecified: Secondary | ICD-10-CM

## 2011-09-18 LAB — APTT: aPTT: 38 seconds — ABNORMAL HIGH (ref 24–37)

## 2011-09-18 LAB — CBC
HCT: 33.9 % — ABNORMAL LOW (ref 39.0–52.0)
MCHC: 32.7 g/dL (ref 30.0–36.0)
Platelets: 304 10*3/uL (ref 150–400)
RDW: 13.5 % (ref 11.5–15.5)
WBC: 5.6 10*3/uL (ref 4.0–10.5)

## 2011-09-18 LAB — BASIC METABOLIC PANEL
CO2: 28 mEq/L (ref 19–32)
Calcium: 8.5 mg/dL (ref 8.4–10.5)
Potassium: 3.8 mEq/L (ref 3.5–5.1)
Sodium: 135 mEq/L (ref 135–145)

## 2011-09-18 LAB — PROTIME-INR: INR: 1.1 (ref 0.00–1.49)

## 2011-09-18 MED ORDER — FENTANYL CITRATE 0.05 MG/ML IJ SOLN
INTRAMUSCULAR | Status: AC | PRN
  Administered 2011-09-18 (×2): 100 ug via INTRAVENOUS

## 2011-09-18 MED ORDER — MIDAZOLAM HCL 5 MG/5ML IJ SOLN
INTRAMUSCULAR | Status: AC | PRN
  Administered 2011-09-18 (×2): 2 mg via INTRAVENOUS

## 2011-09-18 NOTE — Progress Notes (Signed)
HOSPITAL PROGRESS NOTE   BLESSING OZGA 51 y.o.  1960-07-02    HISTORY: Mr. Goar underwent percutaneous drainage of his perirectal abscess this morning by interventional radiology. He appears to have tolerated this procedure without any problems. His pain appears to be well-controlled with oxycodone as needed. He is not requiring intravenous pain medicines at this time. He is eating well and ambulatory. He had a bowel movement this morning. He is without complaints.  MEDICATIONS:  Scheduled Meds:   . amLODipine  10 mg Oral Daily  . enoxaparin  40 mg Subcutaneous Q24H  . piperacillin-tazobactam (ZOSYN)  IV  3.375 g Intravenous Q8H   Continuous Infusions:   . 0.9 % sodium chloride with kcl 50 mL/hr at 09/17/11 1425  . DISCONTD: sodium chloride 50 mL/hr at 09/16/11 1743   PRN Meds:.acetaminophen, fentaNYL, midazolam, morphine injection, oxyCODONE, paregoric, prochlorperazine  I have reviewed the patient's current medications.  PHYSICAL EXAM: The patient is currently afebrile however his temperature yesterday was 102.2.  Blood pressure 116/71.  O2 saturation on room air was 94%. Other vital signs were normal. Weight was 139.4 pounds.  General: He looks well in no acute distress.   HEENT: No scleral icterus. Mouth and pharynx are benign. Dentition is poor. Lymphatics: No peripheral adenopathy palpable. A right-sided Port-A-Cath is present.  Resp: Lungs are clear to percussion and auscultation. Cardiac: Regular rhythm without murmur or rub. Abdomen: No organomegaly, masses or tenderness with the patient sitting. Extrem: No peripheral edema. Neuro: Nonfocal.  There is a  catheter draining the right buttock abscess.    LABS:  CBC:   Lab 09/18/11 0539 09/16/11 0940  WBC 5.6 5.7  HGB 11.1* 11.3*  HCT 33.9* 33.8*  PLT 304 304  MCV 92.4 94.7  MCH 30.2 31.8  MCHC 32.7 33.6  RDW 13.5 14.5  LYMPHSABS -- 0.7*  MONOABS -- 0.9  EOSABS -- 0.0  BASOSABS -- 0.0  BANDABS -- --       CHEM:   Lab 09/18/11 0539 09/16/11 2300 09/16/11 0939  NA 135 135 135  K 3.8 3.4* 3.3*  CL 99 97 97  CO2 28 29 29   GLUCOSE 109* 116* 123*  BUN 10 14 13   CREATININE 0.78 0.98 0.83  CALCIUM 8.5 8.3* 8.6  MG -- -- --  AST -- 22 23  ALT -- 11 11  ALKPHOS -- 76 85  BILITOT -- 0.8 0.6  LDH -- -- --     Lab 09/18/11 0539  INR 1.10  PROTIME --   Most recent CEA was 5.1 on 07/29/2011, down from 14.7 on 03/24/2011.  RADIOLOGY:  Ct Pelvis Wo Contrast  09/16/2011  *RADIOLOGY REPORT*  Clinical Data:  History of rectal cancer with prior surgical resection and ongoing radiation.  Right buttock pain.  CT PELVIS WITHOUT CONTRAST  Technique:  Multidetector CT imaging of the pelvis was performed following the standard protocol without intravenous contrast.  Comparison:  03/11/2011  Findings:  Surgical changes are noted in the region of the rectum. Near the surgical clips, gas extends from the lumen of the rectum into the right perirectal soft tissues along the right pelvic sidewall.  This is concerning for anastomotic breakdown.  There is rectal wall thickening which may be related to radiation changes. No free air in the visualized peritoneum.  Surgical changes also seen in the right lower quadrant bowel loops.  No free fluid in the pelvis.  Urinary bladder is partially decompressed, grossly unremarkable.  No acute or focal bony  abnormality.  IMPRESSION: Gas extending from the rectum through the thickened rectal wall into the right perirectal soft tissues concerning for anastomotic breakdown.  This area may be better evaluated with CT with oral and IV contrast if felt clinically indicated.  Rectal wall thickening, possibly related to radiation changes.  These results were discussed with Dr. Truett Perna at the time of interpretation.  Original Report Authenticated By: Cyndie Chime, M.D.   Ct Abdomen Pelvis W Contrast  09/16/2011  *RADIOLOGY REPORT*  Clinical Data: Question of rectal  fistula/abscess.  History of rectal cancer in 2011.  The patient is on chemotherapy. History of colon surgery.  CT ABDOMEN AND PELVIS WITH CONTRAST  Technique:  Multidetector CT imaging of the abdomen and pelvis was performed following the standard protocol during bolus administration of intravenous contrast.  Contrast: OMNIPAQUE IOHEXOL 300 MG/ML  SOLN  Comparison: CT of the abdomen pelvis 03/11/2011  Findings: Numerous small pulmonary nodules identified at the bases. Accounting for differences in slice position, these are probably stable.  No pleural effusions or infiltrates are identified. No focal abnormality identified within the liver, spleen, pancreas, adrenal glands, or left kidney.  Right anterior renal cyst measures 1.6 cm.  The stomach and small bowel loops are normal appearance.  There is significant stool throughout colonic loops.  There is rectal wall thickening and multiple locules of extraluminal gas, extending to the right of the rectum in the perirectal soft tissues and tracking superiorly in the pelvic sidewall.  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 The collection appears slightly decompressed compared with noncontrast exam performed earlier today.  No air fluid level identified on the current study.  Surgical clips are identified within the region of the rectum, consistent with prior anastomosis.  No regional adenopathy.  There is pneumonia  IMPRESSION:  1.  Rectal wall thickening, possibly postoperative.  Tumor recurrence is not excluded. 2.  Soft tissue gas tracking along the right lateral aspect of the rectum, consistent with fistula or abscess. 3.  Extensive amount stool within the colon, suggesting relative obstruction or functional obstruction from the rectal process described above. 4.  Multiple pulmonary nodules consistent with metastases.  Original Report Authenticated By: Patterson Hammersmith, M.D.     ASSESSMENT & PLAN: 1. Right perirectal abscess, status post  percutaneous drainage by interventional radiology on 09/18/2011. Cultures currently pending. Patient is receiving IV Zosyn. Pain control is satisfactory.  CT scan of pelvis from 03/11/2011 raised the question of breakdown of the anastomotic site.  No discrete abscess was seen at that time.  2. Metastatic rectal cancer involving multiple lung nodule-currently being treated with FOLFIRI/Avastin chemotherapy, last given on 09/02/2011, cycle #9. Mr. Schaben was diagnosed with rectal cancer in 2011. He was treated with neoadjuvant radiation and underwent a laparoscopic assisted low anterior resection with a diverting loop ileostomy on 01/14/2010. The final pathology revealed invasive adenocarcinoma extending into the perirectal connective tissue with negative resection margins. He began adjuvant CAPOX chemotherapy 03/11/2010 and completed his adjuvant treatments on 06/25/2010. He underwent reversal of his diverting ileostomy in August 2012.  In January 2013, a CT confirmed multiple nodules at the lung bases consistent with metastatic disease. A biopsy on 04/20/2011 revealed metastatic adenocarcinoma consistent with a colorectal primary.  He has been treated with FOLFIRI/Avastin since March of 2013. A restaging CT on 07/22/2011 after 6 cycles of FOLFIRI/Avastin showed a slight decrease in the size of the pulmonary nodules compared with the chest CT from 04/13/2011.   Continue antibiotics and await culture  results.  Patient is being evaluated by surgery for possible colostomy.   Savannah Erbe S 09/18/2011, 9:52 AM

## 2011-09-18 NOTE — Progress Notes (Signed)
General Surgery Note  LOS: 2 days   Assessment/Plan: 1. Peri-rectal abscess - right posterior  percut drainage - 09/18/2011 (he just got back from x-ray)  To see how drainage does.  Then plans per Dr. Byerly - the patient understands the potential of needing a colostomy  2.  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 Byerly  Dr. B. Sherrill treating oncologist.  CEA - 5.1 on 07/29/2011.   Subjective:  Doing well.  Hungry. Objective:   Filed Vitals:   09/18/11 0755  BP: 116/71  Pulse: 107  Temp: 98.3 F (36.8 C)  Resp: 18     Intake/Output from previous day:  08/02 0701 - 08/03 0700 In: 1865.7 [P.O.:480; I.V.:1135.7; IV Piggyback:250] Out: -   Intake/Output this shift:      Physical Exam:   General: WN AA M who is alert and oriented.    HEENT: Normal. Pupils equal. .   Lungs: Clear   Abdomen: Soft - BS present.  Drain from right buttocks.   Neurologic:  Grossly intact to motor and sensory function.   Psychiatric: Has normal mood and affect. Behavior is normal   Lab Results:    Basename 09/18/11 0539 09/16/11 0940  WBC 5.6 5.7  HGB 11.1* 11.3*  HCT 33.9* 33.8*  PLT 304 304    BMET   Basename 09/18/11 0539 09/16/11 2300  NA 135 135  K 3.8 3.4*  CL 99 97  CO2 28 29  GLUCOSE 109* 116*  BUN 10 14  CREATININE 0.78 0.98  CALCIUM 8.5 8.3*    PT/INR   Basename 09/18/11 0539  LABPROT 14.4  INR 1.10    ABG  No results found for this basename: PHART:2,PCO2:2,PO2:2,HCO3:2 in the last 72 hours   Studies/Results:  Ct Pelvis Wo Contrast  09/16/2011  *RADIOLOGY REPORT*  Clinical Data:  History of rectal cancer with prior surgical resection and ongoing radiation.  Right buttock pain.  CT PELVIS WITHOUT CONTRAST  Technique:  Multidetector CT imaging of the pelvis was performed following the standard protocol without intravenous contrast.  Comparison:  03/11/2011  Findings:  Surgical changes are noted in the region of the  rectum. Near the surgical clips, gas extends from the lumen of the rectum into the right perirectal soft tissues along the right pelvic sidewall.  This is concerning for anastomotic breakdown.  There is rectal wall thickening which may be related to radiation changes. No free air in the visualized peritoneum.  Surgical changes also seen in the right lower quadrant bowel loops.  No free fluid in the pelvis.  Urinary bladder is partially decompressed, grossly unremarkable.  No acute or focal bony abnormality.  IMPRESSION: Gas extending from the rectum through the thickened rectal wall into the right perirectal soft tissues concerning for anastomotic breakdown.  This area may be better evaluated with CT with oral and IV contrast if felt clinically indicated.  Rectal wall thickening, possibly related to radiation changes.  These results were discussed with Dr. Sherrill at the time of interpretation.  Original Report Authenticated By: KEVIN G. DOVER, M.D.   Ct Abdomen Pelvis W Contrast  09/16/2011  *RADIOLOGY REPORT*  Clinical Data: Question of rectal fistula/abscess.  History of rectal cancer in 2011.  The patient is on chemotherapy. History of colon surgery.  CT ABDOMEN AND PELVIS WITH CONTRAST  Technique:  Multidetector CT imaging of the abdomen and pelvis was performed following the standard protocol during bolus administration of intravenous contrast.    Contrast: 100mL OMNIPAQUE IOHEXOL 300 MG/ML  SOLN  Comparison: CT of the abdomen pelvis 03/11/2011  Findings: Numerous small pulmonary nodules identified at the bases. Accounting for differences in slice position, these are probably stable.  No pleural effusions or infiltrates are identified. No focal abnormality identified within the liver, spleen, pancreas, adrenal glands, or left kidney.  Right anterior renal cyst measures 1.6 cm.  The stomach and small bowel loops are normal appearance.  There is significant stool throughout colonic loops.  There is rectal wall  thickening and multiple locules of extraluminal gas, extending to the right of the rectum in the perirectal soft tissues and tracking superiorly in the pelvic sidewall.  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 The collection appears slightly decompressed compared with noncontrast exam performed earlier today.  No air fluid level identified on the current study.  Surgical clips are identified within the region of the rectum, consistent with prior anastomosis.  No regional adenopathy.  There is pneumonia  IMPRESSION:  1.  Rectal wall thickening, possibly postoperative.  Tumor recurrence is not excluded. 2.  Soft tissue gas tracking along the right lateral aspect of the rectum, consistent with fistula or abscess. 3.  Extensive amount stool within the colon, suggesting relative obstruction or functional obstruction from the rectal process described above. 4.  Multiple pulmonary nodules consistent with metastases.  Original Report Authenticated By: ELIZABETH D. BROWN, M.D.     Anti-infectives:   Anti-infectives     Start     Dose/Rate Route Frequency Ordered Stop   09/16/11 1800  piperacillin-tazobactam (ZOSYN) IVPB 3.375 g       3.375 g 12.5 mL/hr over 240 Minutes Intravenous Every 8 hours 09/16/11 1645            Marlean Mortell, MD, FACS Pager: [phone],   [facility] Office: [phone] 09/18/2011   

## 2011-09-18 NOTE — Procedures (Signed)
CT guided 73f drain placement 3ml purulent aspirate sent for GS, C&S No complication No blood loss. See complete dictation in [facility].

## 2011-09-19 NOTE — Progress Notes (Signed)
HOSPITAL PROGRESS NOTE   [name] 51 y.o.  [birth year]    HISTORY: Jeffery [name] is doing extremely well today, eating well and fully active. He is not taking anything for pain. Fever or appears to be subsiding. He is without complaints. He remains on IV Zosyn and prophylactic Lovenox.  MEDICATIONS:  Scheduled Meds:   . amLODipine  10 mg Oral Daily  . enoxaparin  40 mg Subcutaneous Q24H  . piperacillin-tazobactam (ZOSYN)  IV  3.375 g Intravenous Q8H   Continuous Infusions:   . 0.9 % sodium chloride with kcl 50 mL/hr at 09/19/11 0740   PRN Meds:.acetaminophen, morphine injection, oxyCODONE, paregoric, prochlorperazine  I have reviewed the patient's current medications.  PHYSICAL EXAM:  Blood pressure 136/89, pulse 99, temperature 98.5 F (36.9 C), temperature source Oral, resp. rate 18, height 5\' 4"  (1.626 m), weight 139 lb 7 oz (63.248 kg), SpO2 98.00%. On room air. Temperature was 99.5 at 9 PM last evening. General: He looks well in no acute distress.  HEENT: No scleral icterus. Mouth and pharynx are benign. Dentition is poor.  Lymphatics: No peripheral adenopathy palpable.  A right-sided Port-A-Cath is present.  Resp: Lungs are clear to percussion and auscultation.  Cardiac: Regular rhythm without murmur or rub.  Abdomen: No organomegaly, masses or tenderness with the patient sitting.  Extrem: No peripheral edema.  Neuro: Nonfocal.  There is a catheter draining the right buttock abscess. There is bloody fluid in the bag.    LABS:  CBC:   Lab 09/18/11 0539 09/16/11 0940  WBC 5.6 5.7  HGB 11.1* 11.3*  HCT 33.9* 33.8*  PLT 304 304  MCV 92.4 94.7  MCH 30.2 31.8  MCHC 32.7 33.6  RDW 13.5 14.5  LYMPHSABS -- 0.7*  MONOABS -- 0.9  EOSABS -- 0.0  BASOSABS -- 0.0  BANDABS -- --     CHEM:   Lab 09/18/11 0539 09/16/11 2300 09/16/11 0939  NA 135 135 135  K 3.8 3.4* 3.3*  CL 99 97 97  CO2 28 29 29   GLUCOSE 109* 116* 123*  BUN 10 14 13   CREATININE 0.78  0.98 0.83  CALCIUM 8.5 8.3* 8.6  MG -- -- --  AST -- 22 23  ALT -- 11 11  ALKPHOS -- 76 85  BILITOT -- 0.8 0.6  LDH -- -- --     Lab 09/18/11 0539  INR 1.10  PROTIME --   From 09/18/2011: DRAINAGE CT R TRANSGLUTEAL ABSCESS DRAIN CATHETER Special Requests NONE Gram Stain ABUNDANT WBC PRESENT, PREDOMINANTLY PMN NO SQUAMOUS EPITHELIAL CELLS SEEN ABUNDANT GRAM VARIABLE ROD FEW GRAM POSITIVE COCCI IN PAIRS AND CHAINS Culture ABUNDANT GRAM NEGATIVE RODS Report Status PENDING   RADIOLOGY:  CT scan of abdomen and pelvis with IV contrast from 09/16/2011 showed: 1. Rectal wall thickening, possibly postoperative. Tumor  recurrence is not excluded.  2. Soft tissue gas tracking along the right lateral aspect of the  rectum, consistent with fistula or abscess.  3. Extensive amount stool within the colon, suggesting relative  obstruction or functional obstruction from the rectal process  described above.  4. Multiple pulmonary nodules consistent with metastases.    Ct Guided Abscess Drain  09/18/2011  *RADIOLOGY REPORT*  Clinical data:  Rectal carcinoma status post resection and anastomotic breakdown with perirectal abscess.  CT-GUIDED PELVIC ABSCESS DRAINAGE CATHETER PLACEMENT  Technique and findings: The procedure, risks (including but not limited to bleeding, infection, organ damage), benefits, and alternatives were explained to the patient.  Questions regarding  the procedure were encouraged and answered.  The patient understands and consents to the procedure.The patient placed prone and limited axial scans through the lower pelvis were obtained; the collection was localized and an appropriate skin entry site was determined. Operator donned sterile gloves and mask.   Site was marked, prepped with Betadine, draped in usual sterile fashion, infiltrated locally with 1% lidocaine.  Intravenous Fentanyl and Versed were administered as conscious sedation during continuous cardiorespiratory  monitoring by the radiology RN, with a total moderate sedation time of less than 30 minutes.  Under CT fluoroscopic guidance, an 18 gauge trocar needle was advanced into the collection using right transgluteal approach.  An Amplatz guide wire advanced easily within the collection, its position confirmed on CT.  The tract was dilated to facilitate placement of a 12-French pigtail catheter, formed centrally within the collection.  CT confirmed appropriate catheter position. A sample of the aspirate was sent for Gram stain, culture and sensitivity.  Catheter was secured externally with O-Prolene suture and Statlock, and placed to gravity drain bag. The patient tolerated the procedure well.  No immediate complication.  IMPRESSION: 1.  Technically successful pelvic drainage catheter placement under CT guidance.  Original Report Authenticated By: Osa Craver, M.D.     ASSESSMENT & PLAN: 1. Right perirectal abscess, status post percutaneous drainage by interventional radiology on 09/18/2011. Cultures currently pending. Patient is receiving IV Zosyn. Pain has improved dramatically. CT scan of pelvis from 03/11/2011 raised the question of breakdown of the anastomotic site. No discrete abscess was seen at that time.   2. Metastatic rectal cancer involving multiple lung nodule-currently being treated with FOLFIRI/Avastin chemotherapy, last given on 09/02/2011, cycle #9.  Jeffery [name] was diagnosed with rectal cancer in 2011. He was treated with neoadjuvant radiation and underwent a laparoscopic assisted low anterior resection with a diverting loop ileostomy on 01/14/2010. The final pathology revealed invasive adenocarcinoma extending into the perirectal connective tissue with negative resection margins. He began adjuvant CAPOX chemotherapy 03/11/2010 and completed his adjuvant treatments on 06/25/2010. He underwent reversal of his diverting ileostomy in August 2012. In January 2013, a CT confirmed multiple  nodules at the lung bases consistent with metastatic disease. A biopsy on 04/20/2011 revealed metastatic adenocarcinoma consistent with a colorectal primary.  He has been treated with FOLFIRI/Avastin since March of 2013. A restaging CT on 07/22/2011 after 6 cycles of FOLFIRI/Avastin showed a slight decrease in the size of the pulmonary nodules compared with the chest CT from 04/13/2011.    Continue antibiotics.  Awaiting culture results. Patient is being evaluated by surgery for consideration of colostomy.   Jeffery [name] S 09/19/2011, 11:40 AM

## 2011-09-19 NOTE — Progress Notes (Signed)
Subjective: Perirectal abscess s/p percutaneous drainage 8/3. Feeling much better today - in good spirits overall.   Objective: Vital signs in last 24 hours: Temp:  [98.4 F (36.9 C)-99.5 F (37.5 C)] 98.5 F (36.9 C) (08/04 0506) Pulse Rate:  [93-99] 99  (08/04 0506) Resp:  [17-18] 18  (08/04 0506) BP: (123-136)/(82-89) 136/89 mmHg (08/04 0506) SpO2:  [96 %-98 %] 98 % (08/04 0506) Last BM Date: 09/18/11  Intake/Output from previous day: 08/03 0701 - 08/04 0700 In: 1935 [P.O.:720; I.V.:1215] Out: -  Intake/Output this shift:  ? Output from drain -not recorded - will make sure RN aware of documenting same.    PE:  Awake, alert - minimal pain.  Drain intact with clean insertion site. Drain flushed without difficulty or pain to patient with 10 ml NS.  Mostly bloody serous fluid in bag - appx. 50 ml upon exam. Abdomen soft, non-distended.  Positive bowel sounds.    Lab Results:   [facility] 09/18/11 0539 09/16/11 0940  WBC 5.6 5.7  HGB 11.1* 11.3*  HCT 33.9* 33.8*  PLT 304 304   BMET  Basename 09/18/11 0539 09/16/11 2300  NA 135 135  K 3.8 3.4*  CL 99 97  CO2 28 29  GLUCOSE 109* 116*  BUN 10 14  CREATININE 0.78 0.98  CALCIUM 8.5 8.3*   PT/INR  Basename 09/18/11 0539  LABPROT 14.4  INR 1.10   Component  Value   Specimen Description  DRAINAGE CT R TRANSGLUTEAL ABSCESS DRAIN CATHETER   Special Requests  NONE   Gram Stain  ABUNDANT WBC PRESENT, PREDOMINANTLY PMN NO SQUAMOUS EPITHELIAL CELLS SEEN ABUNDANT GRAM VARIABLE ROD FEW GRAM POSITIVE COCCI IN PAIRS AND CHAINS   Culture  ABUNDANT GRAM NEGATIVE RODS   Report Status  PENDING   Resulting Agency  [facility]    Specimen Collected: 09/18/11 7:45 AM  Last Resulted: 09/19/11 7:30 AM        Studies/Results: Ct Guided Abscess Drain  09/18/2011  *RADIOLOGY REPORT*  Clinical data:  Rectal carcinoma status post resection and anastomotic breakdown with perirectal abscess.  CT-GUIDED PELVIC ABSCESS DRAINAGE CATHETER  PLACEMENT  Technique and findings: The procedure, risks (including but not limited to bleeding, infection, organ damage), benefits, and alternatives were explained to the patient.  Questions regarding the procedure were encouraged and answered.  The patient understands and consents to the procedure.The patient placed prone and limited axial scans through the lower pelvis were obtained; the collection was localized and an appropriate skin entry site was determined. Operator donned sterile gloves and mask.   Site was marked, prepped with Betadine, draped in usual sterile fashion, infiltrated locally with 1% lidocaine.  Intravenous Fentanyl and Versed were administered as conscious sedation during continuous cardiorespiratory monitoring by the radiology RN, with a total moderate sedation time of less than 30 minutes.  Under CT fluoroscopic guidance, an 18 gauge trocar needle was advanced into the collection using right transgluteal approach.  An Amplatz guide wire advanced easily within the collection, its position confirmed on CT.  The tract was dilated to facilitate placement of a 12-French pigtail catheter, formed centrally within the collection.  CT confirmed appropriate catheter position. A sample of the aspirate was sent for Gram stain, culture and sensitivity.  Catheter was secured externally with O-Prolene suture and Statlock, and placed to gravity drain bag. The patient tolerated the procedure well.  No immediate complication.  IMPRESSION: 1.  Technically successful pelvic drainage catheter placement under CT guidance.  Original Report Authenticated By:  [name]. DANIEL HASSELL III, M.[name].    Anti-infectives: Anti-infectives     Start     Dose/Rate Route Frequency Ordered Stop   09/16/11 1800   piperacillin-tazobactam (ZOSYN) IVPB 3.375 g        3.375 g 12.5 mL/hr over 240 Minutes Intravenous Every 8 hours 09/16/11 1645            Assessment/Plan: Abscess s/p drainage - to continue drain at this time -  patient being treated with abx - final cultures pending. May possibly still go to OR. IR to continue to follow. Need to continue flushes at least bid upon discharge with recordings of output daily.     LOS: 3 days    Jeffery [name],Jeffery [name] 09/19/2011

## 2011-09-19 NOTE — Progress Notes (Signed)
General Surgery Note  LOS: 3 days   Assessment/Plan: 1. Peri-rectal abscess - right posterior  percut drainage - 09/18/2011.  On Zosyn.  To see how drainage does.  No new labs.  He feels better and is in good spirits.  Then plans per Dr. [name] - the patient understands the potential of needing a colostomy  2.  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 [facility]  Dr. Leonard Schwartz. [place] treating oncologist.  He has been treated with FOLFIRI/Avastin since March of 2013.   CEA - 5.1 on 07/29/2011.   Subjective:  Feels better today and taking regular diet. Objective:   Filed Vitals:   09/19/11 0506  BP: 136/89  Pulse: 99  Temp: 98.5 F (36.9 C)  Resp: 18     Intake/Output from previous day:  08/03 0701 - 08/04 0700 In: 1935 [P.O.:720; I.V.:1215] Out: -   Intake/Output this shift:     Physical Exam:   General: WN AA M who is alert and oriented.    HEENT: Normal. Pupils equal. .   Lungs: Clear   Abdomen: Soft - BS present.  Drain from right buttocks.   Neurologic:  Grossly intact to motor and sensory function.   Psychiatric: Has normal mood and affect. Behavior is normal   Lab Results:     Basename 09/18/11 0539 09/16/11 0940  WBC 5.6 5.7  HGB 11.1* 11.3*  HCT 33.9* 33.8*  PLT 304 304    BMET    Basename 09/18/11 0539 09/16/11 2300  NA 135 135  K 3.8 3.4*  CL 99 97  CO2 28 29  GLUCOSE 109* 116*  BUN 10 14  CREATININE 0.78 0.98  CALCIUM 8.5 8.3*    PT/INR    Basename 09/18/11 0539  LABPROT 14.4  INR 1.10    ABG  No results found for this basename: PHART:2,PCO2:2,PO2:2,HCO3:2 in the last 72 hours   Studies/Results:  Ct Guided Abscess Drain  09/18/2011  *RADIOLOGY REPORT*  Clinical data:  Rectal carcinoma status post resection and anastomotic breakdown with perirectal abscess.  CT-GUIDED PELVIC ABSCESS DRAINAGE CATHETER PLACEMENT  Technique and findings: The procedure, risks (including but not limited to bleeding,  infection, organ damage), benefits, and alternatives were explained to the patient.  Questions regarding the procedure were encouraged and answered.  The patient understands and consents to the procedure.The patient placed prone and limited axial scans through the lower pelvis were obtained; the collection was localized and an appropriate skin entry site was determined. Operator donned sterile gloves and mask.   Site was marked, prepped with Betadine, draped in usual sterile fashion, infiltrated locally with 1% lidocaine.  Intravenous Fentanyl and Versed were administered as conscious sedation during continuous cardiorespiratory monitoring by the radiology RN, with a total moderate sedation time of less than 30 minutes.  Under CT fluoroscopic guidance, an 18 gauge trocar needle was advanced into the collection using right transgluteal approach.  An Amplatz guide wire advanced easily within the collection, its position confirmed on CT.  The tract was dilated to facilitate placement of a 12-French pigtail catheter, formed centrally within the collection.  CT confirmed appropriate catheter position. A sample of the aspirate was sent for Gram stain, culture and sensitivity.  Catheter was secured externally with O-Prolene suture and Statlock, and placed to gravity drain bag. The patient tolerated the procedure well.  No immediate complication.  IMPRESSION: 1.  Technically successful pelvic drainage catheter placement under CT guidance.  Original Report  Authenticated By: Osa Craver, M.D.     Anti-infectives:   Anti-infectives     Start     Dose/Rate Route Frequency Ordered Stop   09/16/11 1800   piperacillin-tazobactam (ZOSYN) IVPB 3.375 g        3.375 g 12.5 mL/hr over 240 Minutes Intravenous Every 8 hours 09/16/11 1645            Ovidio Kin, MD, FACS Pager: [phone],   [facility] [place] [facility] Office: [phone] 09/19/2011

## 2011-09-20 ENCOUNTER — Other Ambulatory Visit (INDEPENDENT_AMBULATORY_CARE_PROVIDER_SITE_OTHER): Payer: Self-pay | Admitting: General Surgery

## 2011-09-20 LAB — CULTURE, ROUTINE-ABSCESS

## 2011-09-20 MED ORDER — OXYCODONE HCL 5 MG PO TABS: 5.0000 mg | ORAL_TABLET | ORAL | Status: DC | PRN

## 2011-09-20 MED ORDER — HEPARIN SOD (PORK) LOCK FLUSH 100 UNIT/ML IV SOLN
INTRAVENOUS | Status: AC
  Filled 2011-09-20: qty 5

## 2011-09-20 MED ORDER — AMOXICILLIN-POT CLAVULANATE 875-125 MG PO TABS: 1.0000 | ORAL_TABLET | Freq: Two times a day (BID) | ORAL | Status: DC

## 2011-09-20 NOTE — Discharge Summary (Signed)
Physician Discharge Summary  Patient ID: Jeffery [name] @ATTENDINGNPI @ MRN: [id number] DOB/AGE: 05-03-1960 51 y.o.  Admit date: 09/16/2011 Discharge date: 09/20/2011  Discharge Diagnoses:  1. Metastatic rectal cancer, currently being treated with FOLFIRI/Avastin, last given on 09/02/2011  2. Rectal fistula/abscess-status post placement of a pelvic drain on 09/18/2011  3. Right gluteal pain secondary to the rectal abscess-improved at   Discharged Condition: Improved  Discharge Labs:  none  Significant Diagnostic Studies: CT of the abdomen and  Consults: Dr. Arrie Aran Dr. Jacobs-gastroenterology  Procedures:  Insertion of a pelvic drain 09/18/2011 in interventional radiology  Disposition: 01-Home or Self Care   Medication List  As of 09/20/2011  8:34 AM   TAKE these medications         amLODipine 5 MG tablet   Commonly known as: NORVASC   Take 2 tablets (10 mg total) by mouth daily.      amoxicillin-clavulanate 875-125 MG per tablet   Commonly known as: AUGMENTIN   Take 1 tablet by mouth 2 (two) times daily.      lidocaine-prilocaine cream   Commonly known as: EMLA   Apply topically as needed. Apply to [facility] site 1-2 hours prior to stick and cover with plastic wrap      oxyCODONE 5 MG immediate release tablet   Commonly known as: Oxy IR/ROXICODONE   Take 1 tablet (5 mg total) by mouth every 4 (four) hours as needed for pain.      paregoric 2 MG/5ML solution   Take 5 mLs by mouth 4 (four) times daily as needed. #158ml --      prochlorperazine 10 MG tablet   Commonly known as: COMPAZINE   Take 1 tablet (10 mg total) by mouth every 6 (six) hours as needed (nausea).            Follow-up Information    Follow up with Thornton Papas, MD. (2:45- Lonna Cobb 8/12)    Contact information:   [address] [place] [address] [phone]          Hospital Course: Jeffery [name] is a 51 year old with a history of metastatic rectal cancer. He  presented to the office on 09/16/2011 for scheduled chemotherapy. He complained of severe pain at the right gluteal area. There was associated tenderness. A CT of the abdomen and pelvis revealed soft tissue gas tracking along the right lateral aspect of the rectum consistent with a fistula or abscess.  He was admitted for further evaluation. He was placed on broad-spectrum intravenous antibiotics with Zosyn. Dr. [name] and Dr. Christella Hartigan for consult. A decision was made against a proctoscopy due to the active inflammation and probable breakdown of the rectal wall. He was referred to interventional radiology and underwent placement of a pelvic drain on 09/18/2011. He was noted to be febrile on hospital admission. The fever resolved after the drain was in place. A culture from the pelvic abscess is pending at the time of discharge. There continues to be a significant amount of drainage from the pelvic catheter.  Dr. [name] plans to take him to a surgical procedure within the next few weeks. She would like to wait until he has been off of Avastin a few more weeks. He will likely need to undergo permanent colostomy placement. The rectum can be assessed for signs of local tumor recurrence at the time of surgery.  Jeffery [name] appeared stable for discharge on the morning of 09/20/2011. The pelvic pain was much improved.     Discharge  Orders    Future Appointments: Provider: Department: Dept Phone: Center:   10/04/2011 5:00 PM Almond Lint, MD [facility] [phone] None   10/29/2011 11:00 AM Almond Lint, MD [facility] [place] [phone] None     Future Orders Please Complete By Expires   Diet - low sodium heart healthy      Increase activity slowly      Discharge instructions      Scheduling Instructions:   Flush rectal drain twice daily as instructed   Comments:   Call for increased pain, fever      Signed: Thornton Papas 09/20/2011, 8:34 AM

## 2011-09-20 NOTE — Progress Notes (Signed)
Pt discharged home in stable condition. Discharge instructions and scripts given to patient. Patient verbalized understanding.

## 2011-09-21 ENCOUNTER — Encounter (HOSPITAL_COMMUNITY): Payer: Self-pay | Admitting: Pharmacy Technician

## 2011-09-22 ENCOUNTER — Telehealth: Payer: Self-pay | Admitting: *Deleted

## 2011-09-22 ENCOUNTER — Other Ambulatory Visit: Payer: Self-pay | Admitting: Oncology

## 2011-09-22 ENCOUNTER — Ambulatory Visit (HOSPITAL_COMMUNITY)
Admission: RE | Admit: 2011-09-22 | Discharge: 2011-09-22 | Disposition: A | Discharge status: Home / Self Care | Payer: Medicaid Other | Source: Ambulatory Visit | Attending: Oncology | Admitting: Oncology

## 2011-09-22 DIAGNOSIS — C20 Malignant neoplasm of rectum: Secondary | ICD-10-CM

## 2011-09-22 NOTE — Telephone Encounter (Signed)
Pt walked in to office today, reporting his drain is "clogged" and smells bad. Asking for flushes. He has not had drain flushed since discharged from hospital. Pt has a prescription for supplies but has not been able to have it filled yet. Called IR, spoke with Victorino Dike, pt being worked in to have drain evaluated.

## 2011-09-23 NOTE — Telephone Encounter (Signed)
Sent order to [facility] yesterday to have drain evaluated and flushed as needed.

## 2011-09-27 ENCOUNTER — Ambulatory Visit (HOSPITAL_BASED_OUTPATIENT_CLINIC_OR_DEPARTMENT_OTHER): Payer: Medicaid Other | Admitting: Nurse Practitioner

## 2011-09-27 VITALS — BP 124/81 | HR 122 | Temp 98.3°F | Resp 18 | Ht 64.0 in | Wt 138.0 lb

## 2011-09-27 DIAGNOSIS — C20 Malignant neoplasm of rectum: Secondary | ICD-10-CM

## 2011-09-27 NOTE — Progress Notes (Signed)
OFFICE PROGRESS NOTE  Interval history:  Jeffery [name] returns as scheduled. He was hospitalized 09/16/2011 through 09/20/2011 with a rectal fistula/abscess. He underwent placement of a pelvic drain on 09/18/2011. The culture returned positive for abundant Escherichia coli. He was discharged home on Augmentin.  Jeffery [name] reports pain related to the drainage catheter. He takes oxycodone as needed. He thinks he may be having intermittent fever, typically in the morning hours. He does not have a thermometer. He had some rectal bleeding yesterday. None today. He denies nausea/vomiting.   Objective: Blood pressure 124/81, pulse 122, temperature 98.3 F (36.8 C), temperature source Oral, resp. rate 18, height 5\' 4"  (1.626 m), weight 138 lb (62.596 kg).  Oropharynx is without thrush or ulceration. Lungs are clear. Regular cardiac rhythm. Port-A-Cath site is without erythema. Abdomen is soft and nontender. No hepatomegaly. Extremities are without edema. Catheter site at the right buttock is without erythema. Brown drainage in the catheter tubing.  Lab Results: Lab Results  Component Value Date   WBC 5.6 09/18/2011   HGB 11.1* 09/18/2011   HCT 33.9* 09/18/2011   MCV 92.4 09/18/2011   PLT 304 09/18/2011    Chemistry:    Chemistry      Component Value Date/Time   NA 135 09/18/2011 0539   K 3.8 09/18/2011 0539   CL 99 09/18/2011 0539   CO2 28 09/18/2011 0539   BUN 10 09/18/2011 0539   CREATININE 0.78 09/18/2011 0539      Component Value Date/Time   CALCIUM 8.5 09/18/2011 0539   ALKPHOS 76 09/16/2011 2300   AST 22 09/16/2011 2300   ALT 11 09/16/2011 2300   BILITOT 0.8 09/16/2011 2300       Studies/Results: Ct Pelvis Wo Contrast  09/16/2011  *RADIOLOGY REPORT*  Clinical Data:  History of rectal cancer with prior surgical resection and ongoing radiation.  Right buttock pain.  CT PELVIS WITHOUT CONTRAST  Technique:  Multidetector CT imaging of the pelvis was performed following the standard protocol without intravenous  contrast.  Comparison:  03/11/2011  Findings:  Surgical changes are noted in the region of the rectum. Near the surgical clips, gas extends from the lumen of the rectum into the right perirectal soft tissues along the right pelvic sidewall.  This is concerning for anastomotic breakdown.  There is rectal wall thickening which may be related to radiation changes. No free air in the visualized peritoneum.  Surgical changes also seen in the right lower quadrant bowel loops.  No free fluid in the pelvis.  Urinary bladder is partially decompressed, grossly unremarkable.  No acute or focal bony abnormality.  IMPRESSION: Gas extending from the rectum through the thickened rectal wall into the right perirectal soft tissues concerning for anastomotic breakdown.  This area may be better evaluated with CT with oral and IV contrast if felt clinically indicated.  Rectal wall thickening, possibly related to radiation changes.  These results were discussed with Dr. Truett Perna at the time of interpretation.  Original Report Authenticated By: Cyndie Chime, M.D.   Ct Guided Abscess Drain  09/18/2011  *RADIOLOGY REPORT*  Clinical data:  Rectal carcinoma status post resection and anastomotic breakdown with perirectal abscess.  CT-GUIDED PELVIC ABSCESS DRAINAGE CATHETER PLACEMENT  Technique and findings: The procedure, risks (including but not limited to bleeding, infection, organ damage), benefits, and alternatives were explained to the patient.  Questions regarding the procedure were encouraged and answered.  The patient understands and consents to the procedure.The patient placed prone and limited axial scans  through the lower pelvis were obtained; the collection was localized and an appropriate skin entry site was determined. Operator donned sterile gloves and mask.   Site was marked, prepped with Betadine, draped in usual sterile fashion, infiltrated locally with 1% lidocaine.  Intravenous Fentanyl and Versed were administered as  conscious sedation during continuous cardiorespiratory monitoring by the radiology RN, with a total moderate sedation time of less than 30 minutes.  Under CT fluoroscopic guidance, an 18 gauge trocar needle was advanced into the collection using right transgluteal approach.  An Amplatz guide wire advanced easily within the collection, its position confirmed on CT.  The tract was dilated to facilitate placement of a 12-French pigtail catheter, formed centrally within the collection.  CT confirmed appropriate catheter position. A sample of the aspirate was sent for Gram stain, culture and sensitivity.  Catheter was secured externally with O-Prolene suture and Statlock, and placed to gravity drain bag. The patient tolerated the procedure well.  No immediate complication.  IMPRESSION: 1.  Technically successful pelvic drainage catheter placement under CT guidance.  Original Report Authenticated By: Osa Craver, M.D.   Ct Abdomen Pelvis W Contrast  09/16/2011  *RADIOLOGY REPORT*  Clinical Data: Question of rectal fistula/abscess.  History of rectal cancer in 2011.  The patient is on chemotherapy. History of colon surgery.  CT ABDOMEN AND PELVIS WITH CONTRAST  Technique:  Multidetector CT imaging of the abdomen and pelvis was performed following the standard protocol during bolus administration of intravenous contrast.  Contrast: OMNIPAQUE IOHEXOL 300 MG/ML  SOLN  Comparison: CT of the abdomen pelvis 03/11/2011  Findings: Numerous small pulmonary nodules identified at the bases. Accounting for differences in slice position, these are probably stable.  No pleural effusions or infiltrates are identified. No focal abnormality identified within the liver, spleen, pancreas, adrenal glands, or left kidney.  Right anterior renal cyst measures 1.6 cm.  The stomach and small bowel loops are normal appearance.  There is significant stool throughout colonic loops.  There is rectal wall thickening and multiple  locules of extraluminal gas, extending to the right of the rectum in the perirectal soft tissues and tracking superiorly in the pelvic sidewall.  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 The collection appears slightly decompressed compared with noncontrast exam performed earlier today.  No air fluid level identified on the current study.  Surgical clips are identified within the region of the rectum, consistent with prior anastomosis.  No regional adenopathy.  There is pneumonia  IMPRESSION:  1.  Rectal wall thickening, possibly postoperative.  Tumor recurrence is not excluded. 2.  Soft tissue gas tracking along the right lateral aspect of the rectum, consistent with fistula or abscess. 3.  Extensive amount stool within the colon, suggesting relative obstruction or functional obstruction from the rectal process described above. 4.  Multiple pulmonary nodules consistent with metastases.  Original Report Authenticated By: Patterson Hammersmith, M.D.    Medications: I have reviewed the patient's current medications.  Assessment/Plan:  1. Clinical stage III (uT3 uN1) rectal cancer status post pelvic radiation, August 24 through November 18, 2009. 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 There was no lymph node involvement (pT3 pN0). He began adjuvant CAPOX chemotherapy March 11, 2010. He completed the sixth and final cycle of CAPOX 06/25/2010. CT of the abdomen and pelvis 03/11/2011  confirmed multiple nodules at the lung bases consistent with metastatic disease. 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 He completed cycle 1 of FOLFIRI/Avastin 04/29/2011. He completed cycle 3 05/27/2011.  CEA was improved on 06/10/2011. He completed cycle 4 on 06/10/2011. He completed cycle 5 on 06/24/2011. He completed cycle 6 on 07/08/2011. Restaging CT evaluation 07/22/2011 showed a slight decrease in pulmonary nodules. He completed cycle 8 FOLFIRI/Avastin on 08/12/2011. He completed cycle 9 on 08/26/2011. 2. Strangulated peristomal hernia and small bowel obstruction status post reduction of peristomal hernia, omentectomy, exploratory laparotomy, small bowel resection x2 on January 28, 2010.  3. History of rectal bleeding secondary to number 1.  4. History of an elevated CEA. Improved on 06/10/2011 and 07/29/2011. 5. History of a left adrenal nodule.  6. Weight loss, improved.  7. Right knee pain/effusion status post evaluation at [facility].  8. CT abdomen/pelvis 03/11/2011 with multiple nodules at the lung bases consistent with metastatic involvement of the lungs; prominent presacral soft tissue; air bubble within the presacral soft tissue appeared to be extraluminal and breakdown at the anastomotic site could not be excluded. No discrete abscess was seen. 9. Elevated blood pressure. Question effect of Avastin. He was started on Norvasc on 06/10/2011. The blood pressure is still elevated. We increase the Norvasc to 10 mg daily. 10. Frequent bowel movements. Likely related to multiple bowel resections. Imodium was not effective. He continues to have frequent bowel movements. He was unable to obtain paregoric. 11. Suture at the ileostomy reversal scar. Suture removed during the recent hospitalization. 12. Erectile dysfunction. The testosterone level returned at 508 on 08/12/2011, he will be referred to urology 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 Culture returned positive for abundant Escherichia coli. He continues Augmentin.  Disposition-Jeffery [name] appears stable. He reports being scheduled for surgery with Dr. [name] on 10/05/2011. He will  return for a followup visit with Dr. Truett Perna 10/22/2011. He will contact the office in the interim with any problems. He has met with our Child psychotherapist as well as Artist today for assistance with medications and supplies related to the drainage catheter.  Plan reviewed with Dr. Truett Perna.  Lonna Cobb ANP/GNP-BC

## 2011-09-28 ENCOUNTER — Encounter: Payer: Self-pay | Admitting: Oncology

## 2011-09-28 NOTE — Progress Notes (Signed)
[name] came in on 09/27/11 requesting assistance for medical devices and medications. [name] is not working at this time.He is living with his mother and she is financially supporting him at this time. I advised [name] we would need a letter of support from his mother stating the type of support she has been providing for him. Once we received the letter of support he will qualify for the [facility] $400.00 one-time grant. [name] stated he will return the letter of support on Wednesday September 29, 2011. I also provided [name] 2 bus passes. Jeffery [name] Merchant navy officer) will be working with [name] for future transportation services.

## 2011-09-29 ENCOUNTER — Encounter (HOSPITAL_COMMUNITY): Payer: Self-pay

## 2011-09-29 ENCOUNTER — Encounter (HOSPITAL_COMMUNITY)
Admission: RE | Admit: 2011-09-29 | Discharge: 2011-09-29 | Disposition: A | Discharge status: Home / Self Care | Payer: Medicaid Other | Source: Ambulatory Visit | Attending: General Surgery | Admitting: General Surgery

## 2011-09-29 ENCOUNTER — Encounter: Payer: Self-pay | Admitting: *Deleted

## 2011-09-29 LAB — CBC
MCH: 30.9 pg (ref 26.0–34.0)
MCHC: 33.3 g/dL (ref 30.0–36.0)
MCV: 92.8 fL (ref 78.0–100.0)
Platelets: 479 10*3/uL — ABNORMAL HIGH (ref 150–400)
RDW: 13.9 % (ref 11.5–15.5)

## 2011-09-29 LAB — COMPREHENSIVE METABOLIC PANEL
ALT: 13 U/L (ref 0–53)
AST: 16 U/L (ref 0–37)
Albumin: 3.1 g/dL — ABNORMAL LOW (ref 3.5–5.2)
Alkaline Phosphatase: 80 U/L (ref 39–117)
CO2: 27 mEq/L (ref 19–32)
Chloride: 102 mEq/L (ref 96–112)
Creatinine, Ser: 0.79 mg/dL (ref 0.50–1.35)
GFR calc non Af Amer: >90 mL/min (ref >90)
Potassium: 4.5 mEq/L (ref 3.5–5.1)
Sodium: 139 mEq/L (ref 135–145)
Total Bilirubin: 0.3 mg/dL (ref 0.3–1.2)

## 2011-09-29 LAB — TYPE AND SCREEN: Antibody Screen: NEGATIVE

## 2011-09-29 LAB — ABO/RH: ABO/RH(D): B POS

## 2011-09-29 LAB — SURGICAL PCR SCREEN: MRSA, PCR: NEGATIVE

## 2011-09-29 NOTE — Pre-Procedure Instructions (Addendum)
20 Jeffery [name]  09/29/2011   Your procedure is scheduled on:  August 20  Report to [facility] at 1:15 PM.  Call this number if you have problems the morning of surgery: [phone]   Remember:   Do not eat or drink food:After Midnight.  Take these medicines the morning of surgery with A SIP OF WATER: Amlodipine, Augmentin, Oxycodone, Compazine, Paregoric (if needed)    Do not wear jewelry, make-up or nail polish.  Do not wear lotions, powders, or perfumes. You may wear deodorant.  Do not shave 48 hours prior to surgery. Men may shave face and neck.  Do not bring valuables to the hospital.  Contacts, dentures or bridgework may not be worn into surgery.  Leave suitcase in the car. After surgery it may be brought to your room.  For patients admitted to the hospital, checkout time is 11:00 AM the day of discharge.   Special Instructions: CHG Shower Use Special Wash: 1/2 bottle night before surgery and 1/2 bottle morning of surgery.   Please read over the following fact sheets that you were given: Pain Booklet, Coughing and Deep Breathing, Blood Transfusion Information and Surgical Site Infection Prevention

## 2011-09-29 NOTE — Progress Notes (Signed)
[facility] Brief Psychosocial Assessment Clinical Social Work  Clinical Social Work was referred by Systems developer for assessment of psychosocial needs.  Clinical Social Worker met with patient in exam room at [facility] to offer support and assess for needs.  Pt expressed concerns for transportation and access to medical supplies.  Pt is no longer working therefore has a significant decrease in income.  CSW and pt discussed transportation resources.  Pt has Medicaid and was familiar with [employer].  Pt was agreeable to CSW making referral to [facility].  Pt was approved and provided information the scheduling process for his future appointments.  CSW also provided the pt with a bus pass to return home on 09/27/11.  CSW also referred pt to Financial Advocate for additional support with financial needs and assistance with co-pays for his medications and supplies.  CSW encouraged pt to call with any additional questions or concerns.        Patient identified barriers to care as  transportation financial concerns  Clinical Social Work interventions: [employer] Referral to [employer]   Tamala Julian, MSW, LCSW Clinical Social Worker [facility] [phone]

## 2011-09-30 ENCOUNTER — Other Ambulatory Visit: Payer: Self-pay | Admitting: *Deleted

## 2011-09-30 DIAGNOSIS — C20 Malignant neoplasm of rectum: Secondary | ICD-10-CM

## 2011-09-30 MED ORDER — OXYCODONE HCL 5 MG PO TABS: 5.0000 mg | ORAL_TABLET | ORAL | Status: DC | PRN

## 2011-09-30 MED ORDER — AMOXICILLIN-POT CLAVULANATE 875-125 MG PO TABS: 1.0000 | ORAL_TABLET | Freq: Two times a day (BID) | ORAL | Status: DC

## 2011-09-30 NOTE — Telephone Encounter (Signed)
Patient called to request refill on his pain medication and if MD wants to continue the antibiotic? He has completed the 10 day course that was ordered. Surgery is scheduled for 10/05/11. Per Dr. Truett Perna, refill antibiotic till surgery. Patient notified.

## 2011-10-01 ENCOUNTER — Telehealth: Payer: Self-pay | Admitting: Oncology

## 2011-10-01 NOTE — Telephone Encounter (Signed)
S/w the pt and he is aware of his sept appt

## 2011-10-04 ENCOUNTER — Telehealth: Payer: Self-pay | Admitting: *Deleted

## 2011-10-04 ENCOUNTER — Encounter: Payer: Self-pay | Admitting: Oncology

## 2011-10-04 ENCOUNTER — Ambulatory Visit (INDEPENDENT_AMBULATORY_CARE_PROVIDER_SITE_OTHER): Payer: Medicaid Other | Admitting: General Surgery

## 2011-10-04 MED ORDER — SODIUM CHLORIDE 0.9 % IV SOLN
1.0000 g | INTRAVENOUS | Status: AC
  Administered 2011-10-05: 1 g via INTRAVENOUS
  Filled 2011-10-04: qty 1

## 2011-10-04 NOTE — Progress Notes (Signed)
Patient is not working right now we did received a letter of support from his mother,and he has qualify for the [facility] which is $400.00.

## 2011-10-04 NOTE — Telephone Encounter (Signed)
Call from pt to follow up on Oxycodone rx. He went to pharmacy and it hadn't been called in. Informed him that this rx needs to be picked up from office every time. Told pt to bring ID to sign for rx. He voiced understanding.

## 2011-10-04 NOTE — Progress Notes (Signed)
Time change called to pt. To arrive 12:45pm

## 2011-10-05 ENCOUNTER — Encounter (HOSPITAL_COMMUNITY)
Admission: RE | Disposition: A | Discharge status: Home / Self Care | Payer: Self-pay | Source: Ambulatory Visit | Attending: General Surgery

## 2011-10-05 ENCOUNTER — Encounter (HOSPITAL_COMMUNITY): Payer: Self-pay | Admitting: Anesthesiology

## 2011-10-05 ENCOUNTER — Ambulatory Visit (HOSPITAL_COMMUNITY): Payer: Medicaid Other | Admitting: Anesthesiology

## 2011-10-05 ENCOUNTER — Inpatient Hospital Stay (HOSPITAL_COMMUNITY)
Admission: RE | Admit: 2011-10-05 | Discharge: 2011-10-10 | DRG: 330 | Disposition: A | Discharge status: Home / Self Care | Payer: Medicaid Other | Source: Ambulatory Visit | Attending: General Surgery | Admitting: General Surgery

## 2011-10-05 ENCOUNTER — Encounter (HOSPITAL_COMMUNITY): Payer: Self-pay | Admitting: General Surgery

## 2011-10-05 ENCOUNTER — Encounter (HOSPITAL_COMMUNITY): Payer: Self-pay

## 2011-10-05 DIAGNOSIS — I10 Essential (primary) hypertension: Secondary | ICD-10-CM | POA: Diagnosis present

## 2011-10-05 DIAGNOSIS — T8132XA Disruption of internal operation (surgical) wound, not elsewhere classified, initial encounter: Secondary | ICD-10-CM | POA: Diagnosis present

## 2011-10-05 DIAGNOSIS — C20 Malignant neoplasm of rectum: Principal | ICD-10-CM

## 2011-10-05 DIAGNOSIS — Y92009 Unspecified place in unspecified non-institutional (private) residence as the place of occurrence of the external cause: Secondary | ICD-10-CM

## 2011-10-05 DIAGNOSIS — R197 Diarrhea, unspecified: Secondary | ICD-10-CM | POA: Diagnosis present

## 2011-10-05 DIAGNOSIS — K611 Rectal abscess: Secondary | ICD-10-CM

## 2011-10-05 DIAGNOSIS — F17200 Nicotine dependence, unspecified, uncomplicated: Secondary | ICD-10-CM | POA: Diagnosis present

## 2011-10-05 DIAGNOSIS — Y832 Surgical operation with anastomosis, bypass or graft as the cause of abnormal reaction of the patient, or of later complication, without mention of misadventure at the time of the procedure: Secondary | ICD-10-CM | POA: Diagnosis present

## 2011-10-05 DIAGNOSIS — C7800 Secondary malignant neoplasm of unspecified lung: Secondary | ICD-10-CM | POA: Diagnosis present

## 2011-10-05 DIAGNOSIS — T81329A Deep disruption or dehiscence of operation wound, unspecified, initial encounter: Secondary | ICD-10-CM | POA: Diagnosis present

## 2011-10-05 DIAGNOSIS — R1031 Right lower quadrant pain: Secondary | ICD-10-CM | POA: Diagnosis present

## 2011-10-05 HISTORY — PX: PROCTOSCOPY: SHX2266

## 2011-10-05 HISTORY — PX: COLOSTOMY: SHX63

## 2011-10-05 HISTORY — PX: EXAMINATION UNDER ANESTHESIA: SHX1540

## 2011-10-05 HISTORY — PX: LAPAROSCOPY: SHX197

## 2011-10-05 LAB — CBC
HCT: 33 % — ABNORMAL LOW (ref 39.0–52.0)
MCH: 29.9 pg (ref 26.0–34.0)
MCHC: 32.1 g/dL (ref 30.0–36.0)
RDW: 14.3 % (ref 11.5–15.5)

## 2011-10-05 LAB — CREATININE, SERUM: GFR calc non Af Amer: >90 mL/min (ref >90)

## 2011-10-05 SURGERY — CREATION, COLOSTOMY
Anesthesia: General | Site: Rectum

## 2011-10-05 MED ORDER — MIDAZOLAM HCL 5 MG/5ML IJ SOLN
INTRAMUSCULAR | Status: DC | PRN
  Administered 2011-10-05: 1 mg via INTRAVENOUS

## 2011-10-05 MED ORDER — DIPHENHYDRAMINE HCL 50 MG/ML IJ SOLN: 12.5000 mg | Freq: Four times a day (QID) | INTRAMUSCULAR | Status: DC | PRN

## 2011-10-05 MED ORDER — DIBUCAINE 1 % RE OINT
TOPICAL_OINTMENT | RECTAL | Status: DC | PRN
  Administered 2011-10-05: 1 via RECTAL

## 2011-10-05 MED ORDER — DIPHENHYDRAMINE HCL 12.5 MG/5ML PO ELIX
12.5000 mg | ORAL_SOLUTION | Freq: Four times a day (QID) | ORAL | Status: DC | PRN
  Filled 2011-10-05: qty 5

## 2011-10-05 MED ORDER — VECURONIUM BROMIDE 10 MG IV SOLR
INTRAVENOUS | Status: DC | PRN
  Administered 2011-10-05: 5 mg via INTRAVENOUS
  Administered 2011-10-05: 2 mg via INTRAVENOUS

## 2011-10-05 MED ORDER — LACTATED RINGERS IV SOLN
INTRAVENOUS | Status: DC | PRN
  Administered 2011-10-05 (×3): via INTRAVENOUS

## 2011-10-05 MED ORDER — METRONIDAZOLE IN NACL 5-0.79 MG/ML-% IV SOLN
500.0000 mg | Freq: Three times a day (TID) | INTRAVENOUS | Status: DC
  Administered 2011-10-05 – 2011-10-10 (×14): 500 mg via INTRAVENOUS
  Filled 2011-10-05 (×18): qty 100

## 2011-10-05 MED ORDER — KCL IN DEXTROSE-NACL 20-5-0.45 MEQ/L-%-% IV SOLN
INTRAVENOUS | Status: DC
  Administered 2011-10-05 – 2011-10-08 (×7): via INTRAVENOUS
  Filled 2011-10-05 (×11): qty 1000

## 2011-10-05 MED ORDER — FENTANYL CITRATE 0.05 MG/ML IJ SOLN
INTRAMUSCULAR | Status: DC | PRN
  Administered 2011-10-05 (×2): 150 ug via INTRAVENOUS
  Administered 2011-10-05: 100 ug via INTRAVENOUS
  Administered 2011-10-05 (×3): 50 ug via INTRAVENOUS

## 2011-10-05 MED ORDER — KCL IN DEXTROSE-NACL 20-5-0.45 MEQ/L-%-% IV SOLN
INTRAVENOUS | Status: AC
  Administered 2011-10-05: 20:00:00
  Filled 2011-10-05: qty 1000

## 2011-10-05 MED ORDER — LIDOCAINE HCL (CARDIAC) 20 MG/ML IV SOLN
INTRAVENOUS | Status: DC | PRN
  Administered 2011-10-05: 30 mg via INTRAVENOUS

## 2011-10-05 MED ORDER — NALOXONE HCL 0.4 MG/ML IJ SOLN: 0.4000 mg | INTRAMUSCULAR | Status: DC | PRN

## 2011-10-05 MED ORDER — DIBUCAINE 1 % RE OINT
TOPICAL_OINTMENT | RECTAL | Status: AC
  Filled 2011-10-05: qty 28

## 2011-10-05 MED ORDER — HYDROMORPHONE HCL PF 1 MG/ML IJ SOLN: 1.0000 mg | INTRAMUSCULAR | Status: DC | PRN

## 2011-10-05 MED ORDER — ENOXAPARIN SODIUM 40 MG/0.4ML ~~LOC~~ SOLN
40.0000 mg | SUBCUTANEOUS | Status: DC
  Administered 2011-10-06 – 2011-10-10 (×5): 40 mg via SUBCUTANEOUS
  Filled 2011-10-05 (×6): qty 0.4

## 2011-10-05 MED ORDER — 0.9 % SODIUM CHLORIDE (POUR BTL) OPTIME
TOPICAL | Status: DC | PRN
  Administered 2011-10-05 (×2): 1000 mL

## 2011-10-05 MED ORDER — HEMOSTATIC AGENTS (NO CHARGE) OPTIME
TOPICAL | Status: DC | PRN
  Administered 2011-10-05: 1

## 2011-10-05 MED ORDER — ONDANSETRON HCL 4 MG/2ML IJ SOLN: 4.0000 mg | Freq: Four times a day (QID) | INTRAMUSCULAR | Status: DC | PRN

## 2011-10-05 MED ORDER — GLYCOPYRROLATE 0.2 MG/ML IJ SOLN
INTRAMUSCULAR | Status: DC | PRN
  Administered 2011-10-05: .6 mg via INTRAVENOUS

## 2011-10-05 MED ORDER — ONDANSETRON HCL 4 MG PO TABS: 4.0000 mg | ORAL_TABLET | Freq: Four times a day (QID) | ORAL | Status: DC | PRN

## 2011-10-05 MED ORDER — LIDOCAINE HCL (PF) 1 % IJ SOLN
INTRAMUSCULAR | Status: DC | PRN
  Administered 2011-10-05: 4.5 mL

## 2011-10-05 MED ORDER — ONDANSETRON HCL 4 MG/2ML IJ SOLN
4.0000 mg | Freq: Four times a day (QID) | INTRAMUSCULAR | Status: DC | PRN
  Administered 2011-10-07 – 2011-10-08 (×2): 4 mg via INTRAVENOUS
  Filled 2011-10-05 (×2): qty 2

## 2011-10-05 MED ORDER — LACTATED RINGERS IV SOLN
INTRAVENOUS | Status: DC
  Administered 2011-10-05: 14:00:00 via INTRAVENOUS

## 2011-10-05 MED ORDER — HYDROMORPHONE HCL PF 1 MG/ML IJ SOLN
INTRAMUSCULAR | Status: AC
  Administered 2011-10-05: 0.5 mg
  Filled 2011-10-05: qty 1

## 2011-10-05 MED ORDER — PROPOFOL 10 MG/ML IV EMUL
INTRAVENOUS | Status: DC | PRN
  Administered 2011-10-05: 160 mg via INTRAVENOUS
  Administered 2011-10-05: 40 mg via INTRAVENOUS

## 2011-10-05 MED ORDER — SODIUM CHLORIDE 0.9 % IV SOLN
INTRAVENOUS | Status: DC | PRN
  Administered 2011-10-05: 15:00:00 via INTRAVENOUS

## 2011-10-05 MED ORDER — LIDOCAINE HCL (PF) 1 % IJ SOLN
INTRAMUSCULAR | Status: AC
  Filled 2011-10-05: qty 30

## 2011-10-05 MED ORDER — BUPIVACAINE-EPINEPHRINE 0.25% -1:200000 IJ SOLN
INTRAMUSCULAR | Status: DC | PRN
  Administered 2011-10-05: 4.5 mL

## 2011-10-05 MED ORDER — SODIUM CHLORIDE 0.9 % IR SOLN
Status: DC | PRN
  Administered 2011-10-05: 1000 mL

## 2011-10-05 MED ORDER — HYDROMORPHONE 0.3 MG/ML IV SOLN
INTRAVENOUS | Status: DC
Start: 2011-10-05 — End: 2011-10-09
  Administered 2011-10-05: via INTRAVENOUS
  Administered 2011-10-06: 2.1 mg via INTRAVENOUS
  Administered 2011-10-06: 1.8 mg via INTRAVENOUS
  Administered 2011-10-06 (×2): 0.6 mg via INTRAVENOUS
  Administered 2011-10-06: 2.1 mg via INTRAVENOUS
  Administered 2011-10-06: 20:00:00 via INTRAVENOUS
  Administered 2011-10-07: 1.2 mg via INTRAVENOUS
  Administered 2011-10-07: 0.3 mg via INTRAVENOUS
  Administered 2011-10-07 (×3): 0.6 mg via INTRAVENOUS
  Administered 2011-10-07: 0.3 mg via INTRAVENOUS
  Administered 2011-10-08: 0.9 mg via INTRAVENOUS
  Administered 2011-10-08: 0.3 mg via INTRAVENOUS
  Administered 2011-10-08: 0.6 mg via INTRAVENOUS
  Administered 2011-10-08: 0.9 mg via INTRAVENOUS
  Administered 2011-10-08: 0.3 mg via INTRAVENOUS
  Administered 2011-10-08: 0.6 mg via INTRAVENOUS
  Administered 2011-10-09: 0.3 mg via INTRAVENOUS
  Administered 2011-10-09: 0.9 mg via INTRAVENOUS
  Administered 2011-10-09: 0.3 mg via INTRAVENOUS
  Filled 2011-10-05 (×3): qty 25

## 2011-10-05 MED ORDER — ONDANSETRON HCL 4 MG/2ML IJ SOLN
INTRAMUSCULAR | Status: DC | PRN
  Administered 2011-10-05: 4 mg via INTRAVENOUS

## 2011-10-05 MED ORDER — BUPIVACAINE-EPINEPHRINE PF 0.25-1:200000 % IJ SOLN
INTRAMUSCULAR | Status: AC
  Filled 2011-10-05: qty 30

## 2011-10-05 MED ORDER — SODIUM CHLORIDE 0.9 % IJ SOLN: 9.0000 mL | INTRAMUSCULAR | Status: DC | PRN

## 2011-10-05 MED ORDER — ONDANSETRON HCL 4 MG/2ML IJ SOLN: 4.0000 mg | Freq: Once | INTRAMUSCULAR | Status: DC | PRN

## 2011-10-05 MED ORDER — CIPROFLOXACIN IN D5W 400 MG/200ML IV SOLN
400.0000 mg | Freq: Two times a day (BID) | INTRAVENOUS | Status: DC
Start: 2011-10-05 — End: 2011-10-10
  Administered 2011-10-06 – 2011-10-10 (×9): 400 mg via INTRAVENOUS
  Filled 2011-10-05 (×11): qty 200

## 2011-10-05 MED ORDER — BUPIVACAINE ON-Q PAIN PUMP (FOR ORDER SET NO CHG)
INJECTION | Status: AC
  Filled 2011-10-05 (×2): qty 1

## 2011-10-05 MED ORDER — HYDROMORPHONE HCL PF 1 MG/ML IJ SOLN
0.2500 mg | INTRAMUSCULAR | Status: DC | PRN
  Administered 2011-10-05: 0.5 mg via INTRAVENOUS

## 2011-10-05 MED ORDER — ACETAMINOPHEN 10 MG/ML IV SOLN
1000.0000 mg | Freq: Four times a day (QID) | INTRAVENOUS | Status: AC
  Administered 2011-10-05 – 2011-10-06 (×4): 1000 mg via INTRAVENOUS
  Filled 2011-10-05 (×5): qty 100

## 2011-10-05 MED ORDER — NEOSTIGMINE METHYLSULFATE 1 MG/ML IJ SOLN
INTRAMUSCULAR | Status: DC | PRN
  Administered 2011-10-05: 4 mg via INTRAVENOUS

## 2011-10-05 SURGICAL SUPPLY — 59 items
BAG URINE LEG 500ML (DRAIN) IMPLANT
BLADE SURG ROTATE 9660 (MISCELLANEOUS) IMPLANT
CANISTER SUCTION 2500CC (MISCELLANEOUS) ×6 IMPLANT
CATH ROBINSON RED A/P 14FR (CATHETERS) ×3 IMPLANT
CHLORAPREP W/TINT 26ML (MISCELLANEOUS) ×3 IMPLANT
CLOTH BEACON ORANGE TIMEOUT ST (SAFETY) ×3 IMPLANT
COVER SURGICAL LIGHT HANDLE (MISCELLANEOUS) ×3 IMPLANT
DERMABOND ADVANCED (GAUZE/BANDAGES/DRESSINGS) ×1
DERMABOND ADVANCED .7 DNX12 (GAUZE/BANDAGES/DRESSINGS) ×2 IMPLANT
DRAPE PROXIMA HALF (DRAPES) ×6 IMPLANT
DRAPE UTILITY 15X26 W/TAPE STR (DRAPE) ×6 IMPLANT
DRAPE WARM FLUID 44X44 (DRAPE) ×3 IMPLANT
ELECT BLADE 6.5 EXT (BLADE) IMPLANT
ELECT CAUTERY BLADE 6.4 (BLADE) ×6 IMPLANT
ELECT REM PT RETURN 9FT ADLT (ELECTROSURGICAL) ×3
ELECTRODE REM PT RTRN 9FT ADLT (ELECTROSURGICAL) ×2 IMPLANT
GLOVE BIO SURGEON STRL SZ 6 (GLOVE) ×9 IMPLANT
GLOVE BIO SURGEON STRL SZ7 (GLOVE) ×9 IMPLANT
GLOVE BIO SURGEON STRL SZ7.5 (GLOVE) ×3 IMPLANT
GLOVE BIOGEL PI IND STRL 6.5 (GLOVE) ×2 IMPLANT
GLOVE BIOGEL PI IND STRL 7.5 (GLOVE) ×2 IMPLANT
GLOVE BIOGEL PI INDICATOR 6.5 (GLOVE) ×1
GLOVE BIOGEL PI INDICATOR 7.5 (GLOVE) ×1
GOWN PREVENTION PLUS XLARGE (GOWN DISPOSABLE) ×3 IMPLANT
GOWN PREVENTION PLUS XXLARGE (GOWN DISPOSABLE) ×3 IMPLANT
GOWN STRL NON-REIN LRG LVL3 (GOWN DISPOSABLE) ×6 IMPLANT
KIT BASIN OR (CUSTOM PROCEDURE TRAY) ×3 IMPLANT
KIT OSTOMY DRAINABLE 2.75 STR (WOUND CARE) ×3 IMPLANT
KIT ROOM TURNOVER OR (KITS) ×3 IMPLANT
LEGGING LITHOTOMY PAIR STRL (DRAPES) ×3 IMPLANT
NS IRRIG 1000ML POUR BTL (IV SOLUTION) ×6 IMPLANT
PAD ARMBOARD 7.5X6 YLW CONV (MISCELLANEOUS) ×6 IMPLANT
PENCIL BUTTON HOLSTER BLD 10FT (ELECTRODE) ×3 IMPLANT
SCISSORS LAP 5X35 DISP (ENDOMECHANICALS) ×3 IMPLANT
SEALER TISSUE G2 CVD JAW 35 (ENDOMECHANICALS) ×2 IMPLANT
SEALER TISSUE G2 CVD JAW 45CM (ENDOMECHANICALS) ×1
SET IRRIG TUBING LAPAROSCOPIC (IRRIGATION / IRRIGATOR) ×3 IMPLANT
SLEEVE ENDOPATH XCEL 5M (ENDOMECHANICALS) ×9 IMPLANT
SPONGE GAUZE 4X4 12PLY (GAUZE/BANDAGES/DRESSINGS) ×3 IMPLANT
SPONGE LAP 18X18 X RAY DECT (DISPOSABLE) IMPLANT
SPONGE SURGIFOAM ABS GEL 100 (HEMOSTASIS) ×3 IMPLANT
STAPLER VISISTAT 35W (STAPLE) ×3 IMPLANT
SUCTION POOLE TIP (SUCTIONS) ×3 IMPLANT
SURGILUBE 2OZ TUBE FLIPTOP (MISCELLANEOUS) IMPLANT
SUT ETHILON 2 0 FS 18 (SUTURE) ×6 IMPLANT
SUT MNCRL AB 4-0 PS2 18 (SUTURE) ×3 IMPLANT
SUT PROLENE 2 0 CT2 30 (SUTURE) IMPLANT
SUT VIC AB 2-0 SH 18 (SUTURE) ×3 IMPLANT
SUT VIC AB 3-0 SH 18 (SUTURE) ×6 IMPLANT
SUT VIC AB 3-0 SH 8-18 (SUTURE) ×6 IMPLANT
TOWEL OR 17X24 6PK STRL BLUE (TOWEL DISPOSABLE) ×3 IMPLANT
TOWEL OR 17X26 10 PK STRL BLUE (TOWEL DISPOSABLE) ×3 IMPLANT
TRAY FOLEY CATH 14FRSI W/METER (CATHETERS) ×3 IMPLANT
TRAY LAPAROSCOPIC (CUSTOM PROCEDURE TRAY) ×3 IMPLANT
TRAY PROCTOSCOPIC FIBER OPTIC (SET/KITS/TRAYS/PACK) IMPLANT
TUBE CONNECTING 12X1/4 (SUCTIONS) ×3 IMPLANT
UNDERPAD 30X30 INCONTINENT (UNDERPADS AND DIAPERS) ×3 IMPLANT
WATER STERILE IRR 1000ML POUR (IV SOLUTION) IMPLANT
YANKAUER SUCT BULB TIP NO VENT (SUCTIONS) ×6 IMPLANT

## 2011-10-05 NOTE — Interval H&P Note (Signed)
History and Physical Interval Note:  10/05/2011 2:14 PM  Jeffery [name]  has presented today for surgery, with the diagnosis of rectal cancer  The various methods of treatment have been discussed with the patient and family. After consideration of risks, benefits and other options for treatment, the patient has consented to  Procedure(s) (LRB): COLOSTOMY (N/A) EXAM UNDER ANESTHESIA (N/A) PROCTOSCOPY (N/A) as a surgical intervention .  The patient's history has been reviewed, patient examined, no change in status, stable for surgery.  I have reviewed the patient's chart and labs.  Questions were answered to the patient's satisfaction.     [name]

## 2011-10-05 NOTE — H&P (View-Only) (Signed)
General Surgery Note  LOS: 2 days   Assessment/Plan: 1. Peri-rectal abscess - right posterior  percut drainage - 09/18/2011 (he just got back from x-ray)  To see how drainage does.  Then plans per Dr. [name] - the patient understands the potential of needing a colostomy  2.  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 [facility]  Dr. Leonard Schwartz. [place] treating oncologist.  CEA - 5.1 on 07/29/2011.   Subjective:  Doing well.  Hungry. Objective:   Filed Vitals:   09/18/11 0755  BP: 116/71  Pulse: 107  Temp: 98.3 F (36.8 C)  Resp: 18     Intake/Output from previous day:  08/02 0701 - 08/03 0700 In: 1865.7 [P.O.:480; I.V.:1135.7; IV Piggyback:250] Out: -   Intake/Output this shift:      Physical Exam:   General: WN AA M who is alert and oriented.    HEENT: Normal. Pupils equal. .   Lungs: Clear   Abdomen: Soft - BS present.  Drain from right buttocks.   Neurologic:  Grossly intact to motor and sensory function.   Psychiatric: Has normal mood and affect. Behavior is normal   Lab Results:    Basename 09/18/11 0539 09/16/11 0940  WBC 5.6 5.7  HGB 11.1* 11.3*  HCT 33.9* 33.8*  PLT 304 304    BMET   Basename 09/18/11 0539 09/16/11 2300  NA 135 135  K 3.8 3.4*  CL 99 97  CO2 28 29  GLUCOSE 109* 116*  BUN 10 14  CREATININE 0.78 0.98  CALCIUM 8.5 8.3*    PT/INR   Basename 09/18/11 0539  LABPROT 14.4  INR 1.10    ABG  No results found for this basename: PHART:2,PCO2:2,PO2:2,HCO3:2 in the last 72 hours   Studies/Results:  Ct Pelvis Wo Contrast  09/16/2011  *RADIOLOGY REPORT*  Clinical Data:  History of rectal cancer with prior surgical resection and ongoing radiation.  Right buttock pain.  CT PELVIS WITHOUT CONTRAST  Technique:  Multidetector CT imaging of the pelvis was performed following the standard protocol without intravenous contrast.  Comparison:  03/11/2011  Findings:  Surgical changes are noted in the region of the  rectum. Near the surgical clips, gas extends from the lumen of the rectum into the right perirectal soft tissues along the right pelvic sidewall.  This is concerning for anastomotic breakdown.  There is rectal wall thickening which may be related to radiation changes. No free air in the visualized peritoneum.  Surgical changes also seen in the right lower quadrant bowel loops.  No free fluid in the pelvis.  Urinary bladder is partially decompressed, grossly unremarkable.  No acute or focal bony abnormality.  IMPRESSION: Gas extending from the rectum through the thickened rectal wall into the right perirectal soft tissues concerning for anastomotic breakdown.  This area may be better evaluated with CT with oral and IV contrast if felt clinically indicated.  Rectal wall thickening, possibly related to radiation changes.  These results were discussed with Dr. Truett Perna at the time of interpretation.  Original Report Authenticated By: Cyndie Chime, M.D.   Ct Abdomen Pelvis W Contrast  09/16/2011  *RADIOLOGY REPORT*  Clinical Data: Question of rectal fistula/abscess.  History of rectal cancer in 2011.  The patient is on chemotherapy. History of colon surgery.  CT ABDOMEN AND PELVIS WITH CONTRAST  Technique:  Multidetector CT imaging of the abdomen and pelvis was performed following the standard protocol during bolus administration of intravenous contrast.  Contrast: OMNIPAQUE IOHEXOL 300 MG/ML  SOLN  Comparison: CT of the abdomen pelvis 03/11/2011  Findings: Numerous small pulmonary nodules identified at the bases. Accounting for differences in slice position, these are probably stable.  No pleural effusions or infiltrates are identified. No focal abnormality identified within the liver, spleen, pancreas, adrenal glands, or left kidney.  Right anterior renal cyst measures 1.6 cm.  The stomach and small bowel loops are normal appearance.  There is significant stool throughout colonic loops.  There is rectal wall  thickening and multiple locules of extraluminal gas, extending to the right of the rectum in the perirectal soft tissues and tracking superiorly in the pelvic sidewall.  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 The collection appears slightly decompressed compared with noncontrast exam performed earlier today.  No air fluid level identified on the current study.  Surgical clips are identified within the region of the rectum, consistent with prior anastomosis.  No regional adenopathy.  There is pneumonia  IMPRESSION:  1.  Rectal wall thickening, possibly postoperative.  Tumor recurrence is not excluded. 2.  Soft tissue gas tracking along the right lateral aspect of the rectum, consistent with fistula or abscess. 3.  Extensive amount stool within the colon, suggesting relative obstruction or functional obstruction from the rectal process described above. 4.  Multiple pulmonary nodules consistent with metastases.  Original Report Authenticated By: Patterson Hammersmith, M.D.     Anti-infectives:   Anti-infectives     Start     Dose/Rate Route Frequency Ordered Stop   09/16/11 1800  piperacillin-tazobactam (ZOSYN) IVPB 3.375 g       3.375 g 12.5 mL/hr over 240 Minutes Intravenous Every 8 hours 09/16/11 1645            Ovidio Kin, MD, FACS Pager: [phone],   [facility] Office: [phone] 09/18/2011

## 2011-10-05 NOTE — Progress Notes (Signed)
Pt. Reports that he started bleeding from the rectum last p.m., sometimes "heavy" & sometimes "light". Pt. Also reports that he has lost clots into the commode. Call to Dr.  [name], reported the same, no new order.

## 2011-10-05 NOTE — Preoperative (Signed)
Beta Blockers   Reason not to administer Beta Blockers:Not Applicable 

## 2011-10-05 NOTE — Anesthesia Procedure Notes (Signed)
Procedure Name: Intubation Date/Time: 10/05/2011 3:25 PM Performed by: Marni Griffon Pre-anesthesia Checklist: Emergency Drugs available, Patient identified, Suction available and Patient being monitored Patient Re-evaluated:Patient Re-evaluated prior to inductionOxygen Delivery Method: Circle system utilized Preoxygenation: Pre-oxygenation with 100% oxygen Intubation Type: IV induction Ventilation: Mask ventilation without difficulty Laryngoscope Size: Mac and 3 Grade View: Grade I Tube type: Oral Tube size: 7.5 mm Number of attempts: 1 Airway Equipment and Method: Stylet Placement Confirmation: breath sounds checked- equal and bilateral,  ETT inserted through vocal cords under direct vision and positive ETCO2 Secured at: 22 (cm at teeth) cm Tube secured with: Tape Dental Injury: Teeth and Oropharynx as per pre-operative assessment

## 2011-10-05 NOTE — Transfer of Care (Signed)
Immediate Anesthesia Transfer of Care Note  Patient: Jeffery [name]  Procedure(s) Performed: Procedure(s) (LRB): COLOSTOMY (N/A) EXAM UNDER ANESTHESIA (N/A) PROCTOSCOPY (N/A) LAPAROSCOPY DIAGNOSTIC (N/A)  Patient Location: PACU  Anesthesia Type: General  Level of Consciousness: awake and patient cooperative  Airway & Oxygen Therapy: Patient Spontanous Breathing and Patient connected to face mask oxygen  Post-op Assessment: Report given to PACU RN  Post vital signs: Reviewed and stable  Complications: No apparent anesthesia complications

## 2011-10-05 NOTE — Anesthesia Preprocedure Evaluation (Addendum)
Anesthesia Evaluation  Patient identified by MRN, date of birth, ID band Patient awake    Reviewed: Allergy & Precautions, H&P , NPO status , Patient's Chart, lab work & pertinent test results, reviewed documented beta blocker date and time   Airway Mallampati: I TM Distance: >3 FB Neck ROM: full    Dental  (+) Dental Advidsory Given   Pulmonary Current Smoker,  pulm nodules breath sounds clear to auscultation  Pulmonary exam normal       Cardiovascular hypertension, Pt. on medications Rhythm:regular Rate:Normal     Neuro/Psych negative neurological ROS     GI/Hepatic Neg liver ROS, Colon cancer   Endo/Other  negative endocrine ROS  Renal/GU negative Renal ROS     Musculoskeletal   Abdominal   Peds  Hematology   Anesthesia Other Findings   Reproductive/Obstetrics                        Anesthesia Physical Anesthesia Plan  ASA: III  Anesthesia Plan: General   Post-op Pain Management:    Induction: Intravenous  Airway Management Planned: Oral ETT  Additional Equipment:   Intra-op Plan:   Post-operative Plan: Extubation in OR  Informed Consent: I have reviewed the patients History and Physical, chart, labs and discussed the procedure including the risks, benefits and alternatives for the proposed anesthesia with the patient or authorized representative who has indicated his/her understanding and acceptance.   Dental Advisory Given and Dental advisory given  Plan Discussed with: CRNA, Anesthesiologist and Surgeon  Anesthesia Plan Comments: (Plan routine monitors, GETA)      Anesthesia Quick Evaluation

## 2011-10-05 NOTE — Anesthesia Postprocedure Evaluation (Signed)
  Anesthesia Post-op Note  Patient: Jeffery [name]  Procedure(s) Performed: Procedure(s) (LRB): COLOSTOMY (N/A) EXAM UNDER ANESTHESIA (N/A) PROCTOSCOPY (N/A) LAPAROSCOPY DIAGNOSTIC (N/A)  Patient Location: PACU  Anesthesia Type: General  Level of Consciousness: awake, alert  and oriented  Airway and Oxygen Therapy: Patient Spontanous Breathing and Patient connected to nasal cannula oxygen  Post-op Pain: none  Post-op Assessment: Post-op Vital signs reviewed and Patient's Cardiovascular Status Stable  Post-op Vital Signs: stable  Complications: No apparent anesthesia complications

## 2011-10-05 NOTE — Op Note (Signed)
PRE-OPERATIVE DIAGNOSIS: Stage IV rectal cancer, rectal leak secondary to recurrent cancer vs anastamotic dehiscence    POST-OPERATIVE DIAGNOSIS:  Same  PROCEDURE:  Procedure(s): Laparoscopic diverting loop descending colostomy, EUA, rigid proctoscopy, biopsies  SURGEON:  Surgeon(s): Almond Lint, MD  ASSISTANT:  Emelia Loron, MD  ANESTHESIA:   local and general  DRAINS: previously placed pigtail drain remains in place   LOCAL MEDICATIONS USED:  MARCAINE    and LIDOCAINE   SPECIMEN:  Source of Specimen:  anterior posterior, left, and right rectal wall biopsies  DISPOSITION OF SPECIMEN:  PATHOLOGY  COUNTS:  YES  PLAN OF CARE: Admit to inpatient   PATIENT DISPOSITION:  PACU - hemodynamically stable.   PROCEDURE: Pt was identified in the holding area and taken to the operating room where he was placed supine on the operating room table.  General anesthesia was induced. He was then placed into the low lithotomy position with his arms tucked. Foley catheter was placed.  His abdomen and perineum were prepped and draped in sterile fashion.  The patient was then placed into reverse Trendelenburg position and rotated to the right. Timeout was performed according to the surgical safety checklist. When all was correct we continued. The subcostal margin was anesthetized with local anesthetic and a 6 mm incision was made with a #11 blade. The 5 mm Optiview port was placed in the left upper quadrant under direct visualization. Pneumoperitoneum was achieved to a pressure of 15 mm of mercury.  The patient was then placed into Trendelenburg position. Two five mm ports were placed in the right abdomen.  The colon was identified on the left as well as some small bowel that was overlying the colon. The adhesions were taken down sharply with scissors. A loop of small bowel was freed up from the left lower corner and retracted high up in the abdomen. The descending colon was mobilized from the  lateral abdominal wall. An additional port was placed in the left abdomen to assist with retraction. This was selected as the spot for the ostomy.  Once the colon had been mobilized adequately to reach the abdominal wall, the pneumoperitoneum was evacuated. The left abdominal trocar was converted into a colostomy site. The fascia was opened up in cruciate fashion. The muscles were separated bluntly with a Tresa Endo. The posterior fascia was opened as well. This was dilated to 2 fingerbreadths. The colon was pulled up through the abdominal wall and a window was made for a bridge. A 16 French red rubber catheter was used to create a bridge. The pneumoperitoneum was allowed to evacuate. The 5 mm trocars were closed with 4-0 Monocryl in subcuticular fashion.  These were then cleaned dried and dressed with Dermabond.  The colon was then opened and the ostomy was created with interrupted 3-0 Vicryl sutures. The bridge was secured with a 2-0 nylon. The ostomy appliance was applied. Needle sponge and instrument counts were correct x2.  Attention was then directed to the anus. The anus was examined digitally and there was very firm tissue around 6-7 cm inside the anus. The proctoscope was used to examine the rectum. There was no fungating mass. The proctoscope would not advance further than 11 cm. The bulk of the firm tissue felt like it was around 7 cm. This was biopsied with pituitary forceps. The rectum was then dressed with Surgicel and dibucaine ointment gauze and mesh underwear. The patient was taken to the PACU in stable condition. Needle sponge and instrument counts are correct x2.

## 2011-10-06 LAB — COMPREHENSIVE METABOLIC PANEL
AST: 16 U/L (ref 0–37)
Albumin: 2.5 g/dL — ABNORMAL LOW (ref 3.5–5.2)
Calcium: 8.7 mg/dL (ref 8.4–10.5)
Creatinine, Ser: 0.74 mg/dL (ref 0.50–1.35)
Total Protein: 7.1 g/dL (ref 6.0–8.3)

## 2011-10-06 LAB — PHOSPHORUS: Phosphorus: 3.3 mg/dL (ref 2.3–4.6)

## 2011-10-06 MED ORDER — NICOTINE 21 MG/24HR TD PT24
21.0000 mg | MEDICATED_PATCH | Freq: Every day | TRANSDERMAL | Status: DC
  Administered 2011-10-06 – 2011-10-10 (×5): 21 mg via TRANSDERMAL
  Filled 2011-10-06 (×5): qty 1

## 2011-10-06 NOTE — Progress Notes (Signed)
0100 Notified Dr. Purnell Shoemaker that Qball was not started in the OR not orders received

## 2011-10-06 NOTE — Progress Notes (Addendum)
UR completed.   Pt is an active patient with [facility].

## 2011-10-06 NOTE — Progress Notes (Signed)
1 Day Post-Op  Subjective: Pain is tolerable.  Using PCA.   Objective: Vital signs in last 24 hours: Temp:  [97.6 F (36.4 C)-98.1 F (36.7 C)] 98.1 F (36.7 C) (08/21 1056) Pulse Rate:  [78-113] 87  (08/21 1056) Resp:  [14-20] 20  (08/21 1056) BP: (109-142)/(76-98) 123/85 mmHg (08/21 1056) SpO2:  [96 %-100 %] 100 % (08/21 1056)    Intake/Output from previous day: 08/20 0701 - 08/21 0700 In: 4020 [I.V.:3420; IV Piggyback:600] Out: 2340 [Urine:2310; Drains:5; Stool:25] Intake/Output this shift: Total I/O In: 0  Out: 100 [Urine:100]  General appearance: alert, cooperative and no distress Resp: no respiratory distress GI: soft, appropriately tender Extremities: extremities normal, atraumatic, no cyanosis or edema Ostomy pink.    Lab Results:   [facility] 10/05/11 1948  WBC 9.8  HGB 10.6*  HCT 33.0*  PLT 316   BMET  Basename 10/06/11 0545 10/05/11 1948  NA 139 --  K 3.6 --  CL 104 --  CO2 29 --  GLUCOSE 119* --  BUN 9 --  CREATININE 0.74 0.71  CALCIUM 8.7 --   PT/INR No results found for this basename: LABPROT:2,INR:2 in the last 72 hours ABG No results found for this basename: PHART:2,PCO2:2,PO2:2,HCO3:2 in the last 72 hours  Studies/Results: No results found.  Anti-infectives: Anti-infectives     Start     Dose/Rate Route Frequency Ordered Stop   10/05/11 2030   metroNIDAZOLE (FLAGYL) IVPB 500 mg        500 mg 100 mL/hr over 60 Minutes Intravenous Every 8 hours 10/05/11 2016     10/05/11 2030   ciprofloxacin (CIPRO) IVPB 400 mg        400 mg 200 mL/hr over 60 Minutes Intravenous Every 12 hours 10/05/11 2016     10/04/11 1423   ertapenem (INVANZ) 1 g in sodium chloride 0.9 % 50 mL IVPB        1 g 100 mL/hr over 30 Minutes Intravenous 60 min pre-op 10/04/11 1423 10/05/11 1515          Assessment/Plan: s/p Procedure(s) (LRB): COLOSTOMY (N/A) EXAM UNDER ANESTHESIA (N/A) PROCTOSCOPY (N/A) LAPAROSCOPY DIAGNOSTIC (N/A) Ambulate. Sips of  clears OK Recheck labs in AM Await biopsies. Nicotine patch for tobacco abuse.    LOS: 1 day    [facility] 10/06/2011

## 2011-10-07 ENCOUNTER — Encounter (HOSPITAL_COMMUNITY): Payer: Self-pay | Admitting: *Deleted

## 2011-10-07 ENCOUNTER — Inpatient Hospital Stay (HOSPITAL_COMMUNITY): Payer: Medicaid Other

## 2011-10-07 DIAGNOSIS — M79609 Pain in unspecified limb: Secondary | ICD-10-CM

## 2011-10-07 LAB — CBC
HCT: 33.2 % — ABNORMAL LOW (ref 39.0–52.0)
Hemoglobin: 10.6 g/dL — ABNORMAL LOW (ref 13.0–17.0)
RBC: 3.57 MIL/uL — ABNORMAL LOW (ref 4.22–5.81)
RDW: 14.2 % (ref 11.5–15.5)
WBC: 10.6 10*3/uL — ABNORMAL HIGH (ref 4.0–10.5)

## 2011-10-07 LAB — BASIC METABOLIC PANEL
BUN: 5 mg/dL — ABNORMAL LOW (ref 6–23)
Chloride: 99 mEq/L (ref 96–112)
GFR calc Af Amer: >90 mL/min (ref >90)
Glucose, Bld: 134 mg/dL — ABNORMAL HIGH (ref 70–99)
Potassium: 3.6 mEq/L (ref 3.5–5.1)

## 2011-10-07 MED ORDER — PROMETHAZINE HCL 25 MG/ML IJ SOLN
12.5000 mg | Freq: Four times a day (QID) | INTRAMUSCULAR | Status: DC | PRN
  Administered 2011-10-07: 12.5 mg via INTRAVENOUS
  Filled 2011-10-07: qty 1

## 2011-10-07 MED ORDER — SODIUM CHLORIDE 0.9 % IV BOLUS (SEPSIS)
1000.0000 mL | Freq: Once | INTRAVENOUS | Status: AC
  Administered 2011-10-07: 1000 mL via INTRAVENOUS

## 2011-10-07 MED ORDER — IOHEXOL 350 MG/ML SOLN
100.0000 mL | Freq: Once | INTRAVENOUS | Status: AC | PRN
  Administered 2011-10-07: 100 mL via INTRAVENOUS

## 2011-10-07 NOTE — Progress Notes (Signed)
VASCULAR LAB PRELIMINARY  PRELIMINARY  PRELIMINARY  PRELIMINARY  Bilateral lower extremity venous duplex completed.    Preliminary report:  Bilateral:  No evidence of DVT, superficial thrombosis, or Baker's Cyst.    Holley Kocurek, RVS 10/07/2011, 3:05 PM

## 2011-10-07 NOTE — Progress Notes (Signed)
Patient ID: Jeffery [name], male   DOB: [birth year], 51 y.o.   MRN: [id number] 2 Days Post-Op  Subjective: Pain is tolerable.  Using PCA. Tolerated sips of clears.  No nausea or vomiting.    Objective: Vital signs in last 24 hours: Temp:  [98.1 F (36.7 C)-98.5 F (36.9 C)] 98.5 F (36.9 C) (08/22 0543) Pulse Rate:  [87-102] 102  (08/22 0600) Resp:  [18-26] 26  (08/22 0739) BP: (121-161)/(74-99) 138/82 mmHg (08/22 0600) SpO2:  [96 %-100 %] 99 % (08/22 0739) Weight:  [138 lb 7.2 oz (62.8 kg)] 138 lb 7.2 oz (62.8 kg) (08/22 0341)    Intake/Output from previous day: 08/21 0701 - 08/22 0700 In: 3158 [P.O.:110; I.V.:2648; IV Piggyback:400] Out: 1300 [Urine:1300] Intake/Output this shift: Total I/O In: 169 [I.V.:169] Out: -   General appearance: alert, cooperative and no distress Resp: no respiratory distress GI: soft, appropriately tender Extremities: extremities normal, atraumatic, no cyanosis or edema Ostomy pink, gas in bag.  No stool in bag.    Lab Results:   [facility] 10/07/11 0603 10/05/11 1948  WBC 10.6* 9.8  HGB 10.6* 10.6*  HCT 33.2* 33.0*  PLT 330 316   BMET  Basename 10/07/11 0603 10/06/11 0545  NA 137 139  K 3.6 3.6  CL 99 104  CO2 30 29  GLUCOSE 134* 119*  BUN 5* 9  CREATININE 0.78 0.74  CALCIUM 9.1 8.7   PT/INR No results found for this basename: LABPROT:2,INR:2 in the last 72 hours ABG No results found for this basename: PHART:2,PCO2:2,PO2:2,HCO3:2 in the last 72 hours  Studies/Results: No results found.  Anti-infectives: Anti-infectives     Start     Dose/Rate Route Frequency Ordered Stop   10/05/11 2030   metroNIDAZOLE (FLAGYL) IVPB 500 mg        500 mg 100 mL/hr over 60 Minutes Intravenous Every 8 hours 10/05/11 2016     10/05/11 2030   ciprofloxacin (CIPRO) IVPB 400 mg        400 mg 200 mL/hr over 60 Minutes Intravenous Every 12 hours 10/05/11 2016     10/04/11 1423   ertapenem (INVANZ) 1 g in sodium chloride 0.9 % 50 mL IVPB        1  g 100 mL/hr over 30 Minutes Intravenous 60 min pre-op 10/04/11 1423 10/05/11 1515          Assessment/Plan: s/p Procedure(s) (LRB): COLOSTOMY (N/A) EXAM UNDER ANESTHESIA (N/A) PROCTOSCOPY (N/A) LAPAROSCOPY DIAGNOSTIC (N/A) Ambulate. Clears today.    Await biopsies. Nicotine patch for tobacco abuse.     LOS: 2 days    [facility] 10/07/2011

## 2011-10-07 NOTE — Progress Notes (Addendum)
Pt's NG tube pulled out accidentally. He refused to have another NG tube inserted, states "he is feeling much better, stomach is not bloated anymore" MD on call paged and made aware. No orders at this time.

## 2011-10-08 LAB — CBC
MCHC: 32.9 g/dL (ref 30.0–36.0)
Platelets: 386 10*3/uL (ref 150–400)
RDW: 14.2 % (ref 11.5–15.5)

## 2011-10-08 LAB — BASIC METABOLIC PANEL
BUN: 9 mg/dL (ref 6–23)
GFR calc Af Amer: >90 mL/min (ref >90)
GFR calc non Af Amer: >90 mL/min (ref >90)
Potassium: 3.7 mEq/L (ref 3.5–5.1)
Sodium: 138 mEq/L (ref 135–145)

## 2011-10-08 MED ORDER — SODIUM CHLORIDE 0.9 % IV SOLN
25.0000 mg | Freq: Four times a day (QID) | INTRAVENOUS | Status: DC | PRN
  Administered 2011-10-08: 25 mg via INTRAVENOUS
  Filled 2011-10-08: qty 1

## 2011-10-08 MED ORDER — CLONIDINE HCL 0.2 MG PO TABS
0.2000 mg | ORAL_TABLET | Freq: Three times a day (TID) | ORAL | Status: DC | PRN
  Administered 2011-10-08 – 2011-10-09 (×2): 0.2 mg via ORAL
  Filled 2011-10-08 (×3): qty 1

## 2011-10-08 MED ORDER — AMLODIPINE BESYLATE 10 MG PO TABS
10.0000 mg | ORAL_TABLET | Freq: Every day | ORAL | Status: DC
  Administered 2011-10-08 – 2011-10-10 (×3): 10 mg via ORAL
  Filled 2011-10-08 (×4): qty 1

## 2011-10-08 MED ORDER — CHLORHEXIDINE GLUCONATE 0.12 % MT SOLN
OROMUCOSAL | Status: AC
  Administered 2011-10-08: 15 mL
  Filled 2011-10-08: qty 15

## 2011-10-08 MED ORDER — BIOTENE DRY MOUTH MT LIQD
15.0000 mL | Freq: Two times a day (BID) | OROMUCOSAL | Status: DC
  Administered 2011-10-08 – 2011-10-09 (×3): 15 mL via OROMUCOSAL

## 2011-10-08 NOTE — Progress Notes (Signed)
Patient ID: Jeffery [name], male   DOB: July 17, 1960, 51 y.o.   MRN: [id number] 3 Days Post-Op  Subjective:.   Had episode of emesis yesterday.  Feeling much better today  Objective: Vital signs in last 24 hours: Temp:  [98.1 F (36.7 C)-98.6 F (37 C)] 98.1 F (36.7 C) (08/23 0539) Pulse Rate:  [100-113] 105  (08/23 0539) Resp:  [14-26] 16  (08/23 0539) BP: (138-166)/(80-110) 156/110 mmHg (08/23 0540) SpO2:  [94 %-100 %] 98 % (08/23 0539)    Intake/Output from previous day: 08/22 0701 - 08/23 0700 In: 1794 [I.V.:169; IV Piggyback:1625] Out: 600 [Urine:300; Emesis/NG output:300] Intake/Output this shift:    General appearance: alert, cooperative and no distress Resp: no respiratory distress GI: soft, appropriately tender Extremities: extremities normal, atraumatic, no cyanosis or edema Ostomy pink, gas in bag.  No stool in bag.    Lab Results:   [facility] 10/07/11 0603 10/05/11 1948  WBC 10.6* 9.8  HGB 10.6* 10.6*  HCT 33.2* 33.0*  PLT 330 316   BMET  Basename 10/07/11 0603 10/06/11 0545  NA 137 139  K 3.6 3.6  CL 99 104  CO2 30 29  GLUCOSE 134* 119*  BUN 5* 9  CREATININE 0.78 0.74  CALCIUM 9.1 8.7   PT/INR No results found for this basename: LABPROT:2,INR:2 in the last 72 hours ABG No results found for this basename: PHART:2,PCO2:2,PO2:2,HCO3:2 in the last 72 hours  Studies/Results: Ct Angio Chest Pe W/cm &/or Wo Cm  10/07/2011  *RADIOLOGY REPORT*  Clinical Data: Postop from colostomy for metastatic rectal carcinoma.  Hypoxia.  Short of breath.  CT ANGIOGRAPHY CHEST  Technique:  Multidetector CT imaging of the chest using the standard protocol during bolus administration of intravenous contrast. Multiplanar reconstructed images including MIPs were obtained and reviewed to evaluate the vascular anatomy.  Contrast: OMNIPAQUE IOHEXOL 350 MG/ML SOLN  Comparison: 07/22/2011  Findings: Satisfactory opacification of the pulmonary arteries noted, and there is no  evidence of pulmonary emboli.  No evidence of thoracic aortic aneurysm or dissection.  No evidence of mediastinal hematoma or mass.  No lymphadenopathy seen within the thorax.  Numerous small bilateral pulmonary metastases have increased in both size and number since previous study.  An index pulmonary nodule in the right middle lobe now measures 19 mm compared to 16 mm on previous study.  Increased dependent atelectasis is noted mainly in the lower lobes.  There is no evidence of pleural or pericardial effusion.  A small hiatal hernia is noted. No suspicious bone lesions identified.  Dilated bowel loops seen in the upper abdomen may reflect a postop ileus.  IMPRESSION:  1.  No evidence of pulmonary embolism. 2.  Mild progression of diffuse bilateral pulmonary metastases. 3.  Increased bilateral lower lobe dependent atelectasis. 4.  Small hiatal hernia. 5. Dilated small bowel loops seen in the upper abdomen are suspicious for a postop ileus.  Consider abdominal radiographs for better assessment of the bowel gas pattern.   Original Report Authenticated By: Danae Orleans, M.D.     Anti-infectives: Anti-infectives     Start     Dose/Rate Route Frequency Ordered Stop   10/05/11 2030   metroNIDAZOLE (FLAGYL) IVPB 500 mg        500 mg 100 mL/hr over 60 Minutes Intravenous Every 8 hours 10/05/11 2016     10/05/11 2030   ciprofloxacin (CIPRO) IVPB 400 mg        400 mg 200 mL/hr over 60 Minutes Intravenous Every 12  hours 10/05/11 2016     10/04/11 1423   ertapenem (INVANZ) 1 g in sodium chloride 0.9 % 50 mL IVPB        1 g 100 mL/hr over 30 Minutes Intravenous 60 min pre-op 10/04/11 1423 10/05/11 1515          Assessment/Plan: s/p Procedure(s) (LRB): COLOSTOMY (N/A) EXAM UNDER ANESTHESIA (N/A) PROCTOSCOPY (N/A) LAPAROSCOPY DIAGNOSTIC (N/A) Ambulate. Restart clears. Amlodipine for HTN.  Add prn clonidine   Await biopsies. Nicotine patch for tobacco abuse.     LOS: 3 days     [facility] 10/08/2011

## 2011-10-08 NOTE — Progress Notes (Signed)
0540 Patient's b/p 156/110 manual. Dr Carolynne Edouard notified and orders received to restart patient's Norvasc.

## 2011-10-08 NOTE — Progress Notes (Signed)
1610 Patient vomited in trash can refuses to have ngt replaced. Zofran 4mg  given IV.

## 2011-10-09 MED ORDER — OXYCODONE-ACETAMINOPHEN 5-325 MG PO TABS
1.0000 | ORAL_TABLET | ORAL | Status: DC | PRN
  Administered 2011-10-09 – 2011-10-10 (×2): 2 via ORAL
  Filled 2011-10-09 (×3): qty 2

## 2011-10-09 NOTE — Progress Notes (Signed)
Patient ID: Jeffery [name], male   DOB: 1960/03/21, 51 y.o.   MRN: [id number] 4 Days Post-Op  Subjective:.   Feels better today, had more output this am, tolerating clears well, wants more to eat.  Pain controlled and not using PCA much.   Objective: Vital signs in last 24 hours: Temp:  [97.7 F (36.5 C)-98.8 F (37.1 C)] 98.4 F (36.9 C) (08/24 0549) Pulse Rate:  [97-125] 97  (08/24 0600) Resp:  [14-22] 22  (08/24 0801) BP: (98-136)/(69-97) 127/89 mmHg (08/24 0549) SpO2:  [94 %-98 %] 97 % (08/24 0801) FiO2 (%):  [98 %] 98 % (08/24 0400) Last BM Date: 10/03/11  Intake/Output from previous day: 08/23 0701 - 08/24 0700 In: 1740 [P.O.:240; I.V.:1500] Out: 400 [Urine:400] Intake/Output this shift:   Physical Exam: General appearance: alert, cooperative and no distress Resp: CTA GI: soft, appropriately tender Extremities: no edema Ostomy pink, gas in bag.  Liquid in bag     Lab Results:   Basename 10/08/11 0930 10/07/11 0603  WBC 13.2* 10.6*  HGB 11.6* 10.6*  HCT 35.3* 33.2*  PLT 386 330   BMET  Basename 10/08/11 0930 10/07/11 0603  NA 138 137  K 3.7 3.6  CL 99 99  CO2 30 30  GLUCOSE 153* 134*  BUN 9 5*  CREATININE 0.69 0.78  CALCIUM 9.1 9.1   PT/INR No results found for this basename: LABPROT:2,INR:2 in the last 72 hours ABG No results found for this basename: PHART:2,PCO2:2,PO2:2,HCO3:2 in the last 72 hours  Studies/Results: Ct Angio Chest Pe W/cm &/or Wo Cm  10/07/2011  *RADIOLOGY REPORT*  Clinical Data: Postop from colostomy for metastatic rectal carcinoma.  Hypoxia.  Short of breath.  CT ANGIOGRAPHY CHEST  Technique:  Multidetector CT imaging of the chest using the standard protocol during bolus administration of intravenous contrast. Multiplanar reconstructed images including MIPs were obtained and reviewed to evaluate the vascular anatomy.  Contrast: OMNIPAQUE IOHEXOL 350 MG/ML SOLN  Comparison: 07/22/2011  Findings: Satisfactory opacification of the  pulmonary arteries noted, and there is no evidence of pulmonary emboli.  No evidence of thoracic aortic aneurysm or dissection.  No evidence of mediastinal hematoma or mass.  No lymphadenopathy seen within the thorax.  Numerous small bilateral pulmonary metastases have increased in both size and number since previous study.  An index pulmonary nodule in the right middle lobe now measures 19 mm compared to 16 mm on previous study.  Increased dependent atelectasis is noted mainly in the lower lobes.  There is no evidence of pleural or pericardial effusion.  A small hiatal hernia is noted. No suspicious bone lesions identified.  Dilated bowel loops seen in the upper abdomen may reflect a postop ileus.  IMPRESSION:  1.  No evidence of pulmonary embolism. 2.  Mild progression of diffuse bilateral pulmonary metastases. 3.  Increased bilateral lower lobe dependent atelectasis. 4.  Small hiatal hernia. 5. Dilated small bowel loops seen in the upper abdomen are suspicious for a postop ileus.  Consider abdominal radiographs for better assessment of the bowel gas pattern.   Original Report Authenticated By: Danae Orleans, M.D.     Anti-infectives: Anti-infectives     Start     Dose/Rate Route Frequency Ordered Stop   10/05/11 2030   metroNIDAZOLE (FLAGYL) IVPB 500 mg        500 mg 100 mL/hr over 60 Minutes Intravenous Every 8 hours 10/05/11 2016     10/05/11 2030   ciprofloxacin (CIPRO) IVPB 400 mg  400 mg 200 mL/hr over 60 Minutes Intravenous Every 12 hours 10/05/11 2016     10/04/11 1423   ertapenem (INVANZ) 1 g in sodium chloride 0.9 % 50 mL IVPB        1 g 100 mL/hr over 30 Minutes Intravenous 60 min pre-op 10/04/11 1423 10/05/11 1515          Assessment/Plan: s/p Procedure(s) (LRB):COLOSTOMY, EXAM UNDER ANESTHESIA, PROCTOSCOPY, LAPAROSCOPY DIAGNOSTIC: Ambulate. Advance to fulls. D/c PCA and add PO pain meds with prn IV HTN: better with amlodipine and catapres   Await biopsies. Nicotine  patch for tobacco abuse.     LOS: 4 days    Jeffery [name] 10/09/2011

## 2011-10-10 MED ORDER — AMOXICILLIN-POT CLAVULANATE 875-125 MG PO TABS: 1.0000 | ORAL_TABLET | Freq: Two times a day (BID) | ORAL | Status: DC

## 2011-10-10 MED ORDER — OXYCODONE-ACETAMINOPHEN 5-325 MG PO TABS: 1.0000 | ORAL_TABLET | ORAL | Status: DC | PRN

## 2011-10-10 NOTE — Discharge Summary (Addendum)
Patient ID: Jeffery [name] [id number] 51 y.o. Feb 07, 1961  10/05/2011  Discharge date and time: No discharge date for patient encounter.  Admitting Physician: Dr [name]  Discharge Physician: Dr Maisie Fus  Admission Diagnoses: rectal cancer  Discharge Diagnoses: rectal cancer  Operations: Procedure(s): COLOSTOMY EXAM UNDER ANESTHESIA PROCTOSCOPY LAPAROSCOPY DIAGNOSTIC    Discharged Condition: good    Hospital Course: The patient was admitted after diverting colostomy.  He tolerated the procedure well.  His diet was advanced slowly.  By POD 4 he was tolerating a liquid diet and his pain was controlled on oral pain medications.  He was able to change his ostomy bag without difficulty.  His ostomy red rubber catheter was removed prior to d/c home.  He will continue his antibiotics and pelvic drain after discharge.   Treatments: IV hydration, antibiotics: Cipro and metronidazole, analgesia: Dilaudid and Percocet and surgery: Laparoscopic colostomy creation  Disposition: Home

## 2011-10-10 NOTE — Progress Notes (Signed)
5 Days Post-Op  Subjective: Patient doing well.  Ostomy functioning.  He states he is able to change and empty it without problems.  No nausea.  Tolerating FLD.  No rectal bleeding.  Wants to go home.    Objective: Vital signs in last 24 hours: Temp:  [98 F (36.7 C)-98.7 F (37.1 C)] 98 F (36.7 C) (08/25 0544) Pulse Rate:  [97-102] 101  (08/25 0544) Resp:  [18] 18  (08/25 0544) BP: (118-144)/(84-95) 144/92 mmHg (08/25 0544) SpO2:  [95 %-98 %] 98 % (08/25 0544)   Intake/Output from previous day: 08/24 0701 - 08/25 0700 In: 360 [P.O.:360] Out: 20 [Drains:20]  EXAM General appearance: alert and cooperative Resp: clear to auscultation bilaterally GI: soft, non-tender; bowel sounds normal; no masses,  no organomegaly ostomy pink and viable.  stool in bag  Incision: healing well, no significant drainage  Lab Results:   Basename 10/08/11 0930  WBC 13.2*  HGB 11.6*  HCT 35.3*  PLT 386   BMET  Basename 10/08/11 0930  NA 138  K 3.7  CL 99  CO2 30  GLUCOSE 153*  BUN 9  CREATININE 0.69  CALCIUM 9.1   PT/INR No results found for this basename: LABPROT:2,INR:2 in the last 72 hours ABG No results found for this basename: PHART:2,PCO2:2,PO2:2,HCO3:2 in the last 72 hours  MEDS, Scheduled    . amLODipine  10 mg Oral Daily  . antiseptic oral rinse  15 mL Mouth Rinse BID  . ciprofloxacin  400 mg Intravenous Q12H  . enoxaparin (LOVENOX) injection  40 mg Subcutaneous Q24H  . metronidazole  500 mg Intravenous Q8H  . nicotine  21 mg Transdermal Daily  . DISCONTD: HYDROmorphone PCA 0.3 mg/mL   Intravenous Q4H    Studies/Results: No results found.  Assessment: s/p Procedure(s): COLOSTOMY EXAM UNDER ANESTHESIA PROCTOSCOPY LAPAROSCOPY DIAGNOSTIC Patient Active Problem List  Diagnosis  . ADENOCARCINOMA, RECTUM  . RECTAL BLEEDING  . DIARRHEA  . CHANGE IN BOWELS  . ABNORMAL FINDINGS GI TRACT  . Abdominal pain, acute, right lower quadrant  . Peri-rectal abscess       Plan: Advance diet to regular Discharge if tolerates reg diet HWIV   LOS: 5 days     .Vanita Panda, MD [facility], [place] [phone]   10/10/2011 8:01 AM

## 2011-10-10 NOTE — Progress Notes (Signed)
Pt getting ready to be discharged. Lives with his significant other Jeffery [name] who's been his caregiver for the last nine years.  Pt and girlfriend verbalized knowledge of drain and site care and also colostomy care.

## 2011-10-11 NOTE — Progress Notes (Signed)
Agree.  Doing ok

## 2011-10-12 ENCOUNTER — Emergency Department (HOSPITAL_COMMUNITY): Payer: Medicaid Other

## 2011-10-12 ENCOUNTER — Telehealth (INDEPENDENT_AMBULATORY_CARE_PROVIDER_SITE_OTHER): Payer: Self-pay

## 2011-10-12 ENCOUNTER — Encounter (HOSPITAL_COMMUNITY): Payer: Self-pay | Admitting: Emergency Medicine

## 2011-10-12 ENCOUNTER — Inpatient Hospital Stay (HOSPITAL_COMMUNITY)
Admission: EM | Admit: 2011-10-12 | Discharge: 2011-10-21 | DRG: 389 | Disposition: A | Discharge status: Home Health | Payer: Medicaid Other | Attending: General Surgery | Admitting: General Surgery

## 2011-10-12 DIAGNOSIS — C7800 Secondary malignant neoplasm of unspecified lung: Secondary | ICD-10-CM | POA: Diagnosis present

## 2011-10-12 DIAGNOSIS — K5900 Constipation, unspecified: Secondary | ICD-10-CM | POA: Diagnosis present

## 2011-10-12 DIAGNOSIS — K56609 Unspecified intestinal obstruction, unspecified as to partial versus complete obstruction: Principal | ICD-10-CM | POA: Diagnosis present

## 2011-10-12 DIAGNOSIS — C20 Malignant neoplasm of rectum: Secondary | ICD-10-CM | POA: Diagnosis present

## 2011-10-12 DIAGNOSIS — I10 Essential (primary) hypertension: Secondary | ICD-10-CM | POA: Diagnosis present

## 2011-10-12 DIAGNOSIS — Z933 Colostomy status: Secondary | ICD-10-CM

## 2011-10-12 DIAGNOSIS — Z720 Tobacco use: Secondary | ICD-10-CM | POA: Diagnosis present

## 2011-10-12 HISTORY — DX: Secondary malignant neoplasm of unspecified lung: C20

## 2011-10-12 HISTORY — DX: Secondary malignant neoplasm of unspecified lung: C78.00

## 2011-10-12 HISTORY — DX: Malignant neoplasm of rectum: C20

## 2011-10-12 LAB — COMPREHENSIVE METABOLIC PANEL
Albumin: 3.3 g/dL — ABNORMAL LOW (ref 3.5–5.2)
Alkaline Phosphatase: 64 U/L (ref 39–117)
BUN: 18 mg/dL (ref 6–23)
CO2: 30 mEq/L (ref 19–32)
Chloride: 97 mEq/L (ref 96–112)
Creatinine, Ser: 0.58 mg/dL (ref 0.50–1.35)
GFR calc non Af Amer: >90 mL/min (ref >90)
Potassium: 3.3 mEq/L — ABNORMAL LOW (ref 3.5–5.1)
Total Bilirubin: 0.4 mg/dL (ref 0.3–1.2)

## 2011-10-12 LAB — APTT: aPTT: 33 seconds (ref 24–37)

## 2011-10-12 LAB — CBC WITH DIFFERENTIAL/PLATELET
Basophils Relative: 0 % (ref 0–1)
Hemoglobin: 11.8 g/dL — ABNORMAL LOW (ref 13.0–17.0)
MCHC: 32.5 g/dL (ref 30.0–36.0)
Monocytes Relative: 10 % (ref 3–12)
Neutro Abs: 10.7 10*3/uL — ABNORMAL HIGH (ref 1.7–7.7)
Neutrophils Relative %: 85 % — ABNORMAL HIGH (ref 43–77)
RBC: 3.96 MIL/uL — ABNORMAL LOW (ref 4.22–5.81)

## 2011-10-12 LAB — CBC
HCT: 34 % — ABNORMAL LOW (ref 39.0–52.0)
Hemoglobin: 11.1 g/dL — ABNORMAL LOW (ref 13.0–17.0)
MCV: 90.4 fL (ref 78.0–100.0)
RBC: 3.76 MIL/uL — ABNORMAL LOW (ref 4.22–5.81)
WBC: 11.2 10*3/uL — ABNORMAL HIGH (ref 4.0–10.5)

## 2011-10-12 LAB — BASIC METABOLIC PANEL
BUN: 19 mg/dL (ref 6–23)
Chloride: 95 mEq/L — ABNORMAL LOW (ref 96–112)
GFR calc Af Amer: >90 mL/min (ref >90)
Potassium: 3.7 mEq/L (ref 3.5–5.1)

## 2011-10-12 LAB — URINALYSIS, ROUTINE W REFLEX MICROSCOPIC
Glucose, UA: NEGATIVE mg/dL
Hgb urine dipstick: NEGATIVE
Ketones, ur: 15 mg/dL — AB
Leukocytes, UA: NEGATIVE
Protein, ur: 100 mg/dL — AB
pH: 5.5 (ref 5.0–8.0)

## 2011-10-12 LAB — MAGNESIUM: Magnesium: 2 mg/dL (ref 1.5–2.5)

## 2011-10-12 MED ORDER — ONDANSETRON HCL 4 MG/2ML IJ SOLN
4.0000 mg | Freq: Once | INTRAMUSCULAR | Status: AC
  Administered 2011-10-12: 4 mg via INTRAVENOUS
  Filled 2011-10-12: qty 2

## 2011-10-12 MED ORDER — IOHEXOL 300 MG/ML  SOLN
100.0000 mL | Freq: Once | INTRAMUSCULAR | Status: AC | PRN
  Administered 2011-10-12: 80 mL via INTRAVENOUS

## 2011-10-12 MED ORDER — LIDOCAINE-PRILOCAINE 2.5-2.5 % EX CREA
TOPICAL_CREAM | CUTANEOUS | Status: DC | PRN
  Filled 2011-10-12: qty 5

## 2011-10-12 MED ORDER — ACETAMINOPHEN 650 MG RE SUPP: 650.0000 mg | Freq: Four times a day (QID) | RECTAL | Status: DC | PRN

## 2011-10-12 MED ORDER — ONDANSETRON HCL 4 MG/2ML IJ SOLN
4.0000 mg | Freq: Four times a day (QID) | INTRAMUSCULAR | Status: DC | PRN
  Administered 2011-10-12 – 2011-10-13 (×2): 4 mg via INTRAVENOUS
  Filled 2011-10-12 (×2): qty 2

## 2011-10-12 MED ORDER — POTASSIUM CHLORIDE IN NACL 20-0.9 MEQ/L-% IV SOLN
INTRAVENOUS | Status: AC
  Administered 2011-10-12: 22:00:00 via INTRAVENOUS
  Filled 2011-10-12 (×4): qty 1000

## 2011-10-12 MED ORDER — SODIUM CHLORIDE 0.9 % IV SOLN
INTRAVENOUS | Status: DC
  Administered 2011-10-12: 12:00:00 via INTRAVENOUS

## 2011-10-12 MED ORDER — NICOTINE 7 MG/24HR TD PT24
7.0000 mg | MEDICATED_PATCH | Freq: Every day | TRANSDERMAL | Status: DC | PRN
  Filled 2011-10-12: qty 1

## 2011-10-12 MED ORDER — ACETAMINOPHEN 325 MG PO TABS: 650.0000 mg | ORAL_TABLET | Freq: Four times a day (QID) | ORAL | Status: DC | PRN

## 2011-10-12 MED ORDER — HYDROMORPHONE HCL PF 1 MG/ML IJ SOLN
1.0000 mg | Freq: Once | INTRAMUSCULAR | Status: AC
  Administered 2011-10-12: 1 mg via INTRAVENOUS
  Filled 2011-10-12: qty 1

## 2011-10-12 MED ORDER — HYDROMORPHONE HCL PF 1 MG/ML IJ SOLN
0.5000 mg | INTRAMUSCULAR | Status: DC | PRN
  Administered 2011-10-12 (×2): 1.5 mg via INTRAVENOUS
  Administered 2011-10-12 – 2011-10-13 (×2): 1 mg via INTRAVENOUS
  Administered 2011-10-13 – 2011-10-16 (×21): 1.5 mg via INTRAVENOUS
  Administered 2011-10-16 (×2): 1 mg via INTRAVENOUS
  Administered 2011-10-16 – 2011-10-17 (×3): 1.5 mg via INTRAVENOUS
  Administered 2011-10-17: 1 mg via INTRAVENOUS
  Administered 2011-10-17: 1.5 mg via INTRAVENOUS
  Administered 2011-10-17: 1 mg via INTRAVENOUS
  Administered 2011-10-17: 1.5 mg via INTRAVENOUS
  Administered 2011-10-17 (×2): 1 mg via INTRAVENOUS
  Administered 2011-10-18: 1.5 mg via INTRAVENOUS
  Administered 2011-10-18 (×6): 1 mg via INTRAVENOUS
  Administered 2011-10-18: 1.5 mg via INTRAVENOUS
  Administered 2011-10-18 – 2011-10-20 (×15): 1 mg via INTRAVENOUS
  Filled 2011-10-12 (×2): qty 2
  Filled 2011-10-12: qty 1
  Filled 2011-10-12 (×2): qty 2
  Filled 2011-10-12: qty 1
  Filled 2011-10-12: qty 2
  Filled 2011-10-12: qty 1
  Filled 2011-10-12: qty 2
  Filled 2011-10-12 (×2): qty 1
  Filled 2011-10-12: qty 2
  Filled 2011-10-12 (×7): qty 1
  Filled 2011-10-12 (×2): qty 2
  Filled 2011-10-12: qty 1
  Filled 2011-10-12: qty 2
  Filled 2011-10-12 (×2): qty 1
  Filled 2011-10-12 (×8): qty 2
  Filled 2011-10-12 (×2): qty 1
  Filled 2011-10-12: qty 2
  Filled 2011-10-12 (×2): qty 1
  Filled 2011-10-12 (×2): qty 2
  Filled 2011-10-12: qty 1
  Filled 2011-10-12: qty 2
  Filled 2011-10-12 (×2): qty 1
  Filled 2011-10-12: qty 2
  Filled 2011-10-12: qty 1
  Filled 2011-10-12: qty 2
  Filled 2011-10-12: qty 1
  Filled 2011-10-12: qty 2
  Filled 2011-10-12 (×2): qty 1
  Filled 2011-10-12: qty 2
  Filled 2011-10-12: qty 1
  Filled 2011-10-12 (×6): qty 2
  Filled 2011-10-12: qty 1

## 2011-10-12 MED ORDER — METOPROLOL TARTRATE 1 MG/ML IV SOLN
5.0000 mg | Freq: Three times a day (TID) | INTRAVENOUS | Status: DC
  Administered 2011-10-12 – 2011-10-14 (×5): 5 mg via INTRAVENOUS
  Filled 2011-10-12 (×8): qty 5

## 2011-10-12 MED ORDER — HYDROMORPHONE HCL PF 1 MG/ML IJ SOLN
0.5000 mg | Freq: Once | INTRAMUSCULAR | Status: AC
  Administered 2011-10-12: 0.5 mg via INTRAVENOUS

## 2011-10-12 MED ORDER — SODIUM CHLORIDE 0.9 % IV SOLN
INTRAVENOUS | Status: DC
  Administered 2011-10-12: 14:00:00 via INTRAVENOUS

## 2011-10-12 MED ORDER — PIPERACILLIN-TAZOBACTAM 3.375 G IVPB
3.3750 g | Freq: Three times a day (TID) | INTRAVENOUS | Status: DC
  Administered 2011-10-12 – 2011-10-21 (×25): 3.375 g via INTRAVENOUS
  Filled 2011-10-12 (×30): qty 50

## 2011-10-12 MED ORDER — ENOXAPARIN SODIUM 40 MG/0.4ML ~~LOC~~ SOLN
40.0000 mg | SUBCUTANEOUS | Status: DC
  Administered 2011-10-12 – 2011-10-20 (×9): 40 mg via SUBCUTANEOUS
  Filled 2011-10-12 (×10): qty 0.4

## 2011-10-12 MED ORDER — SODIUM CHLORIDE 0.9 % IV BOLUS (SEPSIS)
500.0000 mL | Freq: Once | INTRAVENOUS | Status: AC
  Administered 2011-10-12: 500 mL via INTRAVENOUS

## 2011-10-12 NOTE — ED Provider Notes (Signed)
Medical screening examination/treatment/procedure(s) were conducted as a shared visit with non-physician practitioner(s) and myself.  I personally evaluated the patient during the encounter  Tobin Chad, MD 10/12/11 1657

## 2011-10-12 NOTE — ED Notes (Signed)
Patient returned from CT scan.

## 2011-10-12 NOTE — ED Notes (Signed)
[place], [place] for [facility], made aware of BP, no new orders at this time.

## 2011-10-12 NOTE — ED Notes (Signed)
Patient transported to CT 

## 2011-10-12 NOTE — ED Notes (Signed)
Had colostomy surgery on 8/20- has not had any Bowel movement into colostomy bag since, also states has a drainage bag attached to right buttock "to get the infection out" HOme health nurse came to house and told to come here.

## 2011-10-12 NOTE — Telephone Encounter (Signed)
[facility] nurse calling after visiting pt today b/c the pt has had no output of stool in his ostomy bag since last Tuesday 8/20. The pt was having some gas to pass thru the ostomy 48hrs a go but nothing at all now. The pt's belly is extended but the nurse does hear some bowel sounds. The pt is not having any fever,n/v,or chills. The pt is only having liquids no solid foods. I advised the nurse that I would page Dr [name] to see what she would recommend.  I paged Dr [name] and gave her the info on the pt. Dr [name] advised pt to go to the ER. I will notify the P.A. At the hospital.   I called the home health nurse back and told her that Dr [name] wants the pt to go to the ER. The pt wants to go to [facility]. I will notify Will at [facility] that the pt is coming to the ER.  I called the [facility] ER to notify them of the pt's arrival and called Will.

## 2011-10-12 NOTE — Progress Notes (Signed)
ANTIBIOTIC CONSULT NOTE - INITIAL  Pharmacy Consult for Zosyn Indication: perirectal abscess/fistula No Known Allergies  Patient Measurements:   63kg  Vital Signs: BP: 158/96 mmHg (08/27 1033) Pulse Rate: 114  (08/27 1033) Intake/Output from previous day:   Intake/Output from this shift:    Labs:  Basename 10/12/11 1100  WBC 12.6*  HGB 11.8*  PLT 433*  LABCREA --  CREATININE 0.73   The CrCl is unknown because both a height and weight (above a minimum accepted value) are required for this calculation. No results found for this basename: VANCOTROUGH:2,VANCOPEAK:2,VANCORANDOM:2,GENTTROUGH:2,GENTPEAK:2,GENTRANDOM:2,TOBRATROUGH:2,TOBRAPEAK:2,TOBRARND:2,AMIKACINPEAK:2,AMIKACINTROU:2,AMIKACIN:2, in the last 72 hours   Microbiology: Recent Results (from the past 720 hour(s))  CULTURE, ROUTINE-ABSCESS     Status: Normal   Collection Time   09/18/11  7:45 AM      Component Value Range Status Comment   Specimen Description DRAINAGE CT R TRANSGLUTEAL ABSCESS DRAIN CATHETER   Final    Special Requests NONE   Final    Gram Stain     Final    Value: ABUNDANT WBC PRESENT, PREDOMINANTLY PMN     NO SQUAMOUS EPITHELIAL CELLS SEEN     ABUNDANT GRAM VARIABLE ROD     FEW GRAM POSITIVE COCCI IN PAIRS AND CHAINS   Culture ABUNDANT ESCHERICHIA COLI   Final    Report Status 09/20/2011 FINAL   Final    Organism ID, Bacteria ESCHERICHIA COLI   Final   SURGICAL PCR SCREEN     Status: Normal   Collection Time   09/29/11  8:46 AM      Component Value Range Status Comment   MRSA, PCR NEGATIVE  NEGATIVE Final    Staphylococcus aureus NEGATIVE  NEGATIVE Final     Medical History: Past Medical History  Diagnosis Date  . Hypertension     no meds  . Cancer     rectal     Medications:   (Not in a hospital admission) Anti-infectives     Start     Dose/Rate Route Frequency Ordered Stop   10/12/11 1400   piperacillin-tazobactam (ZOSYN) IVPB 3.375 g        3.375 g 12.5 mL/hr over 240  Minutes Intravenous Every 8 hours 10/12/11 1326           Assessment:  51 YOM w/hx rectal Ca with recent admission 8/20-25 for colostomy.   Redmitted 8/27 bc not having stool from colostomy since d/c 8/25 (s/p colostomy 8/20)   Pharmacy asked to dose Zosyn for rectal fistula/abscess - drain in place fom 8/3.   Was d/c 8/25 on augmentin, received IV Flagyl 500mg  q8h 8/20-25)  Scr wnl, WBC slightly elevated. Afebrile  Goal of Therapy:  Appropriate dose of Zosyn  Plan:   Zosyn 3.375g IV q8h, each dose over 4 hours  Follow labs, vitals, cx.   Adjust dose as necessary.  Gwen Her PharmD  [phone] 10/12/2011 1:36 PM

## 2011-10-12 NOTE — ED Notes (Signed)
Attempted to call report to Ester, RN on 5E, to call back

## 2011-10-12 NOTE — ED Notes (Signed)
Pt states on 8/20 had a LQ colostomy and drain placed on R side of buttox. Pt states has not had any stool in colostomy since surgery, stoma pinkish color, states "had bloody drainage from stoma after surgery". No drainage from R butt drain at this time. Pt having LLQ pain, under colostomy, states "I make myself vomit to make me feel better".

## 2011-10-12 NOTE — ED Provider Notes (Signed)
History     CSN: [id number]  Arrival date & time 10/12/11  1022   First MD Initiated Contact with Patient 10/12/11 1018      Chief Complaint  Patient presents with  . Constipation  . abd distention     (Consider location/radiation/quality/duration/timing/severity/associated sxs/prior treatment) HPI Comments: Patient with history of colostomy on 10/05/2011 secondary to rectal abscess after treatment for rectal cancer -- presents from home with complaint of constipation, no ostomy throughput. Patient states that he had some liquid and blood draining right after the surgery however he has not had any solid stool. Home health nurse saw the patient this morning and told him to go the hospital for evaluation. Patient has had some mild pain in his lower abdomen discomfort in the stomach. He has made himself throw up several times and this makes him feel better. Patient continues to eat and drink. Patient states he is using approximately 5 tablets of oxycodone per day for pain. No fever, urinary symptoms. Patient also has a rectal drain in place. Nothing makes symptoms worse.   Patient is a 51 y.o. male presenting with constipation. The history is provided by the patient and medical records.  Constipation  The current episode started 3 to 5 days ago. The onset was gradual. The problem occurs continuously. The problem has been unchanged. The pain is mild. Associated symptoms include abdominal pain, nausea and vomiting. Pertinent negatives include no anorexia, no fever, no diarrhea, no chest pain, no headaches, no coughing and no rash.    Past Medical History  Diagnosis Date  . Hypertension     no meds  . Cancer     rectal     Past Surgical History  Procedure Date  . Multiple tooth extractions     9/11  . Portacath placement 04/12/2011    Procedure: INSERTION PORT-A-CATH;  Surgeon: Almond Lint, MD;  Location: [facility];  Service: General;  Laterality: N/A;  port a cath  insertion  . Colon surgery 12/11    sbo  . Colon surgery 8/12    colon resection-iliostomy  . Colon surgery 2012    iliostomy takedown  . Colostomy 10/05/2011    Procedure: COLOSTOMY;  Surgeon: Almond Lint, MD;  Location: [facility];  Service: General;  Laterality: N/A;  open colostomy  . Examination under anesthesia 10/05/2011    Procedure: EXAM UNDER ANESTHESIA;  Surgeon: Almond Lint, MD;  Location: [facility];  Service: General;  Laterality: N/A;  exam under anesthesia  . Proctoscopy 10/05/2011    Procedure: PROCTOSCOPY;  Surgeon: Almond Lint, MD;  Location: [facility];  Service: General;  Laterality: N/A;  rigid proctoscopy  . Laparoscopy 10/05/2011    Procedure: LAPAROSCOPY DIAGNOSTIC;  Surgeon: Almond Lint, MD;  Location: [facility];  Service: General;  Laterality: N/A;    Family History  Problem Relation Age of Onset  . Cancer Mother     lung    History  Substance Use Topics  . Smoking status: Current Everyday Smoker -- 0.2 packs/day for 12 years    Types: Cigarettes  . Smokeless tobacco: Never Used  . Alcohol Use: Yes     has quit drinking since last week.       Review of Systems  Constitutional: Negative for fever.  HENT: Negative for sore throat and rhinorrhea.   Eyes: Negative for redness.  Respiratory: Negative for cough.   Cardiovascular: Negative for chest pain.  Gastrointestinal: Positive for nausea, vomiting, abdominal pain, constipation and abdominal distention. Negative  for diarrhea, blood in stool and anorexia.  Genitourinary: Negative for dysuria.  Musculoskeletal: Negative for myalgias.  Skin: Negative for rash.  Neurological: Negative for headaches.    Allergies  Review of patient's allergies indicates no known allergies.  Home Medications   Current Outpatient Rx  Name Route Sig Dispense Refill  . AMLODIPINE BESYLATE 5 MG PO TABS Oral Take 2 tablets (10 mg total) by mouth daily. 30 tablet 3  . AMOXICILLIN-POT CLAVULANATE 875-125 MG PO TABS Oral Take 1 tablet  by mouth 2 (two) times daily. pts on day 2 of therapy    . LIDOCAINE-PRILOCAINE 2.5-2.5 % EX CREA Topical Apply topically as needed. Apply to [facility] site 1-2 hours prior to stick and cover with plastic wrap 30 g prn  . OXYCODONE-ACETAMINOPHEN 5-325 MG PO TABS Oral Take 1-2 tablets by mouth every 3 (three) hours as needed. 30 tablet 0  . PAREGORIC 2 MG/5ML PO TINC Oral Take 5 mLs by mouth 4 (four) times daily as needed. #194ml --    . PRESCRIPTION MEDICATION  Pt's a chemo patient and gets chemo at [facility]. Last treatment was 2 months ago. Pt states his next scheduled chemo is in September 2013. Followed by Dr Truett Perna    . PROCHLORPERAZINE MALEATE 10 MG PO TABS Oral Take 1 tablet (10 mg total) by mouth every 6 (six) hours as needed (nausea). 60 tablet 1    BP 158/96  Pulse 114  Resp 20  SpO2 100%  Physical Exam  Nursing note and vitals reviewed. Constitutional: He appears well-developed and well-nourished.  HENT:  Head: Normocephalic and atraumatic.  Eyes: Conjunctivae are normal. Right eye exhibits no discharge. Left eye exhibits no discharge.  Neck: Normal range of motion. Neck supple.  Cardiovascular: Regular rhythm and normal heart sounds.  Tachycardia present.   Pulmonary/Chest: Effort normal and breath sounds normal.  Abdominal: Soft. He exhibits distension (mild). Bowel sounds are decreased. There is no tenderness.       Multiple scars over abdomen. Colostomy in place lower abdomen. Minimal stool in bag. Abdomen is mildly distended. Decreased bowel sounds. Rectal drain in place with clear fluid in bag.   Neurological: He is alert.  Skin: Skin is warm and dry.  Psychiatric: He has a normal mood and affect.    ED Course  Procedures (including critical care time)  Labs Reviewed  CBC WITH DIFFERENTIAL - Abnormal; Notable for the following:    WBC 12.6 (*)     RBC 3.96 (*)     Hemoglobin 11.8 (*)     HCT 36.3 (*)     Platelets 433 (*)     Neutrophils Relative 85 (*)     Neutro Abs  10.7 (*)     Lymphocytes Relative 5 (*)     Monocytes Absolute 1.2 (*)     All other components within normal limits  BASIC METABOLIC PANEL - Abnormal; Notable for the following:    Chloride 95 (*)     Glucose, Bld 142 (*)     All other components within normal limits  CBC  COMPREHENSIVE METABOLIC PANEL  MAGNESIUM  APTT  PROTIME-INR  URINALYSIS, ROUTINE W REFLEX MICROSCOPIC   Dg Abd 2 Views  10/12/2011  *RADIOLOGY REPORT*  Clinical Data: Status post recent colostomy.  Abdominal pain and vomiting.  Evaluate for potential obstruction.  ABDOMEN - 2 VIEW  Comparison: CT of the abdomen and pelvis 09/16/2011.  Findings: Multiple dilated loops of small bowel are noted, measuring up to approximately 4.8  cm in diameter.  On the upright projection, there are multiple air fluid levels.  No definite pneumoperitoneum is identified.  There is a large amount well formed stool in the cecum.  A paucity of stool is otherwise noted in the colon, and there appears to be left lower quadrant colostomy.  A single surgical drain is in place with the pigtail reformed in the low anatomic pelvis.  Paucity of bowel gas in the lower abdomen and pelvis.  IMPRESSION: 1.  Nonspecific bowel gas pattern, concerning for partial or early small bowel obstruction, as above. 2.  No pneumoperitoneum. 3.  Postoperative changes and support apparatus, as above.   Original Report Authenticated By: Florencia Reasons, M.D.      1. Small bowel obstruction   2. Malignant neoplasm of rectum     10:54 AM Patient seen and examined. Work-up initiated. Medications refused.   Vital signs reviewed and are as follows: Filed Vitals:   10/12/11 1033  BP: 158/96  Pulse: 114  Resp: 20   12:15 PM Pain medications ordered. I spoke with Will Marlyne Beards PA-C of [facility] in person who will see patient. Dr. Lorenso Courier has seen. CT abd/pelvis with CM ordered.    Date: 10/12/2011  Rate: 103  Rhythm: sinus tachycardia  QRS Axis: normal  Intervals:  normal  ST/T Wave abnormalities: nonspecific T wave changes  Conduction Disutrbances:none  Narrative Interpretation:   Old EKG Reviewed: unchanged from 10/07/11     MDM  Admit for SBO       Renne Crigler, PA 10/12/11 [address] [place], [place] 10/12/11 1530

## 2011-10-12 NOTE — H&P (Signed)
Patient rather distended and getting hiccups.  Nausea but no vomiting.  He denies any severe pain.  No evidence of any air tinnitus.  Looks calm and relaxed.  Not in shock.  Wife at bedside.  The patient is stable.  There is no evidence of peritonitis, acute abdomen, nor shock.  There is no strong evidence of failure of improvement nor decline with current non-operative management.  The nasogastric tube and aggressive IV fluid resuscitation.  There is no need for surgery at the present moment, but this will have to be reevaluated on a daily basis if he fails to progress/improve.  We will continue to follow.  Dr [name] to see

## 2011-10-12 NOTE — H&P (Signed)
Jeffery [name] is an 51 y.o. male.   Chief Complaint: No stool from colostomy since discharge 10/10/11, abdominal distension and pain. HPI: Patient is a 51 year old gentleman with a history of rectal carcinoma. In 2011 he underwent chemotherapy a left anterior resection and diverting loop ileostomy 01/14/2010. This proved to be adenocarcinoma. He underwent chemotherapy from January - May of 2012th. He's also developed a positive pulmonary nodule and has had ongoing FOLFIRA/Avastin treatment by Dr. Truett Perna. His last treatment was 09/02/11. He was seen and evaluated for right buttocks pain, CT scan shows evidence of a perirectal gas collection involving the gluteal musculature. On 09/18/11 he had a CT-guided drainage of an abscess transgluteal approach and that drain is still functional. Patient was diagnosed with a rectal leak secondary to recurrent cancer or possible anastomotic dehiscence.  On 10/05/11 Dr. [name] did a: laparoscopic diverting loop descending colostomy, exam under anesthesia, rigid proctoscopy, and biopsies. The patient was discharged home on 10/10/11. His ostomy was working normally at that point.  Sunday evening 10/10/2011, he started having abdominal distention, and started having abdominal pain. He had nothing thru his ostomy yesterday, he started having worsening abdominal pain and distention. He denies nausea but he has made himself vomit. He was seen by the home health nurse today, with nothing in his ostomy.  Our Office was contacted and he was referred to the emergency room for evaluation and treatment. Two-view abdomen shows a nonspecific bowel pattern concerning for partial or early small bowel obstruction one loop measures up to 4.8 cm. There was no pneumoperitoneum. The drain was still in place. A CT scan has been ordered. He's been started on IV fluids, treated for pain and nausea. WBC in the ER is 12,600.  We plan to admit the patient for further evaluation and treatment.  Past  Medical History  Diagnosis Date  . Hypertension     no meds  . Cancer     rectal     Past Surgical History  Procedure Date  . Multiple tooth extractions     9/11  . Portacath placement 04/12/2011    Procedure: INSERTION PORT-A-CATH;  Surgeon: Almond Lint, MD;  Location: [facility];  Service: General;  Laterality: N/A;  port a cath insertion  . Colon surgery 12/11    sbo  . Colon surgery 8/12    colon resection-iliostomy  . Colon surgery 2012    iliostomy takedown  . Colostomy 10/05/2011    Procedure: COLOSTOMY;  Surgeon: Almond Lint, MD;  Location: [facility];  Service: General;  Laterality: N/A;  open colostomy  . Examination under anesthesia 10/05/2011    Procedure: EXAM UNDER ANESTHESIA;  Surgeon: Almond Lint, MD;  Location: [facility];  Service: General;  Laterality: N/A;  exam under anesthesia  . Proctoscopy 10/05/2011    Procedure: PROCTOSCOPY;  Surgeon: Almond Lint, MD;  Location: [facility];  Service: General;  Laterality: N/A;  rigid proctoscopy  . Laparoscopy 10/05/2011    Procedure: LAPAROSCOPY DIAGNOSTIC;  Surgeon: Almond Lint, MD;  Location: [facility];  Service: General;  Laterality: N/A;    Family History  Problem Relation Age of Onset  . Cancer Mother     lung   Social History:  reports that he has been smoking Cigarettes.  He has a 3 pack-year smoking history. He has never used smokeless tobacco. He reports that he drinks alcohol. He reports that he does not use illicit drugs.  Allergies: No Known Allergies   (Not in a hospital admission)  Results for orders placed during the hospital encounter of 10/12/11 (from the past 48 hour(s))  CBC WITH DIFFERENTIAL     Status: Abnormal   Collection Time   10/12/11 11:00 AM      Component Value Range Comment   WBC 12.6 (*) 4.0 - 10.5 K/uL    RBC 3.96 (*) 4.22 - 5.81 MIL/uL    Hemoglobin 11.8 (*) 13.0 - 17.0 g/dL    HCT 45.4 (*) 09.8 - 52.0 %    MCV 91.7  78.0 - 100.0 fL    MCH 29.8  26.0 - 34.0 pg    MCHC 32.5   30.0 - 36.0 g/dL    RDW 11.9  14.7 - 82.9 %    Platelets 433 (*) 150 - 400 K/uL    Neutrophils Relative 85 (*) 43 - 77 %    Neutro Abs 10.7 (*) 1.7 - 7.7 K/uL    Lymphocytes Relative 5 (*) 12 - 46 %    Lymphs Abs 0.7  0.7 - 4.0 K/uL    Monocytes Relative 10  3 - 12 %    Monocytes Absolute 1.2 (*) 0.1 - 1.0 K/uL    Eosinophils Relative 0  0 - 5 %    Eosinophils Absolute 0.0  0.0 - 0.7 K/uL    Basophils Relative 0  0 - 1 %    Basophils Absolute 0.0  0.0 - 0.1 K/uL   BASIC METABOLIC PANEL     Status: Abnormal   Collection Time   10/12/11 11:00 AM      Component Value Range Comment   Sodium 135  135 - 145 mEq/L    Potassium 3.7  3.5 - 5.1 mEq/L    Chloride 95 (*) 96 - 112 mEq/L    CO2 28  19 - 32 mEq/L    Glucose, Bld 142 (*) 70 - 99 mg/dL    BUN 19  6 - 23 mg/dL    Creatinine, Ser 5.62  0.50 - 1.35 mg/dL    Calcium 9.4  8.4 - 13.0 mg/dL    GFR calc non Af Amer >90  >90 mL/min    GFR calc Af Amer >90  >90 mL/min    Dg Abd 2 Views  10/12/2011  *RADIOLOGY REPORT*  Clinical Data: Status post recent colostomy.  Abdominal pain and vomiting.  Evaluate for potential obstruction.  ABDOMEN - 2 VIEW  Comparison: CT of the abdomen and pelvis 09/16/2011.  Findings: Multiple dilated loops of small bowel are noted, measuring up to approximately 4.8 cm in diameter.  On the upright projection, there are multiple air fluid levels.  No definite pneumoperitoneum is identified.  There is a large amount well formed stool in the cecum.  A paucity of stool is otherwise noted in the colon, and there appears to be left lower quadrant colostomy.  A single surgical drain is in place with the pigtail reformed in the low anatomic pelvis.  Paucity of bowel gas in the lower abdomen and pelvis.  IMPRESSION: 1.  Nonspecific bowel gas pattern, concerning for partial or early small bowel obstruction, as above. 2.  No pneumoperitoneum. 3.  Postoperative changes and support apparatus, as above.   Original Report Authenticated  By: Florencia Reasons, M.D.     Review of Systems  Constitutional: Negative for fever, chills, weight loss, malaise/fatigue and diaphoresis.  Eyes: Negative.   Respiratory: Negative.   Cardiovascular: Negative.   Gastrointestinal: Positive for vomiting (he has been making himself vomit, thought it would  make more room.) and abdominal pain (abdomen distended and painful starting sunday PM after discharge). Negative for heartburn, nausea, diarrhea, constipation, blood in stool and melena.       He has had some "normal stool," from the rectum.  Genitourinary: Negative.   Musculoskeletal: Negative.   Skin: Negative.   Neurological: Negative.  Negative for weakness.  Endo/Heme/Allergies: Negative.   Psychiatric/Behavioral: Negative.     Blood pressure 158/96, pulse 114, resp. rate 20, SpO2 100.00%. Physical Exam  Constitutional: He is oriented to person, place, and time. He appears well-developed and well-nourished. No distress.  HENT:  Head: Normocephalic and atraumatic.  Left Ear: External ear normal.  Nose: Nose normal.  Eyes: Conjunctivae and EOM are normal. Pupils are equal, round, and reactive to light. Right eye exhibits no discharge. Left eye exhibits no discharge. No scleral icterus.  Neck: Normal range of motion. Neck supple. No JVD present. No tracheal deviation present. No thyromegaly present.  Cardiovascular: Normal heart sounds and intact distal pulses.  Exam reveals no gallop and no friction rub.   No murmur heard.      Tachycardic and irregular  Respiratory: Effort normal and breath sounds normal. No stridor. No respiratory distress. He has no wheezes. He has no rales. He exhibits no tenderness.  GI: He exhibits distension (markedly distended and tender.). He exhibits no mass. There is tenderness (asking for pain medicine diffusely tender). There is no rebound and no guarding.       Ostomy looks pink and appears normal.  The incisions from surgery are healing nicely, no  erythema or tenderness.  Musculoskeletal: He exhibits no edema and no tenderness.  Lymphadenopathy:    He has no cervical adenopathy.  Neurological: He is alert and oriented to person, place, and time. No cranial nerve deficit.  Skin: Skin is warm and dry. No rash noted. He is not diaphoretic. No erythema. No pallor.  Psychiatric: He has a normal mood and affect. His behavior is normal. Judgment and thought content normal.     Assessment/Plan 1. 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 2. Rectal leak secondary to recurrent cancer or anastomotic dehiscence; S/P. laparoscopic diverting loop descending colostomy, UA, rigid proctoscopy, biopsies.  Biopsies have not shown reoccurrence of the cancer. 3. Possible small bowel obstruction, nonfunctional ostomy since discharge 10/10/11. 4. Percutaneous transgluteal abscess drain of intra abdominal abscess.  Plan: Patient is being admitted started on IV therapy, a CT scan will be obtained. We'll consider NG placement and further treatment after the CT is completed. Will [facility] physician assistant for Dr. [name].  Denis Carreon 10/12/2011, 12:27 PM

## 2011-10-13 LAB — CBC
HCT: 34 % — ABNORMAL LOW (ref 39.0–52.0)
Hemoglobin: 10.9 g/dL — ABNORMAL LOW (ref 13.0–17.0)
MCH: 29.6 pg (ref 26.0–34.0)
MCV: 92.4 fL (ref 78.0–100.0)
Platelets: 412 10*3/uL — ABNORMAL HIGH (ref 150–400)
RBC: 3.68 MIL/uL — ABNORMAL LOW (ref 4.22–5.81)

## 2011-10-13 LAB — GLUCOSE, CAPILLARY: Glucose-Capillary: 118 mg/dL — ABNORMAL HIGH (ref 70–99)

## 2011-10-13 LAB — BASIC METABOLIC PANEL
BUN: 18 mg/dL (ref 6–23)
CO2: 29 mEq/L (ref 19–32)
Calcium: 8.5 mg/dL (ref 8.4–10.5)
Creatinine, Ser: 0.71 mg/dL (ref 0.50–1.35)
Glucose, Bld: 129 mg/dL — ABNORMAL HIGH (ref 70–99)

## 2011-10-13 MED ORDER — POTASSIUM CHLORIDE IN NACL 20-0.9 MEQ/L-% IV SOLN
INTRAVENOUS | Status: DC
  Administered 2011-10-13: 85 mL via INTRAVENOUS
  Administered 2011-10-14 – 2011-10-15 (×2): via INTRAVENOUS
  Filled 2011-10-13 (×6): qty 1000

## 2011-10-13 MED ORDER — ZINC TRACE METAL 1 MG/ML IV SOLN
INTRAVENOUS | Status: DC
  Filled 2011-10-13: qty 1000

## 2011-10-13 MED ORDER — INSULIN ASPART 100 UNIT/ML ~~LOC~~ SOLN
0.0000 [IU] | Freq: Four times a day (QID) | SUBCUTANEOUS | Status: DC
  Administered 2011-10-14: 1 [IU] via SUBCUTANEOUS
  Administered 2011-10-15 (×2): 2 [IU] via SUBCUTANEOUS
  Administered 2011-10-15: 1 [IU] via SUBCUTANEOUS
  Administered 2011-10-15: 2 [IU] via SUBCUTANEOUS
  Administered 2011-10-16 – 2011-10-19 (×9): 1 [IU] via SUBCUTANEOUS

## 2011-10-13 MED ORDER — POTASSIUM CHLORIDE 10 MEQ/100ML IV SOLN
10.0000 meq | INTRAVENOUS | Status: AC
  Administered 2011-10-13 (×2): 10 meq via INTRAVENOUS
  Filled 2011-10-13 (×4): qty 100

## 2011-10-13 MED ORDER — FAT EMULSION 20 % IV EMUL
240.0000 mL | INTRAVENOUS | Status: DC
  Filled 2011-10-13: qty 250

## 2011-10-13 NOTE — Consult Note (Signed)
WOC ostomy consult  Stoma type/location: 1 3/4" round, budded slightly from the skin. LLQ, loop colostomy Peristomal assessment: intact without problems Output none, pt reports some flatus today Ostomy pouching: pt using 2 3/4" 2pc pouch but ok to go to smaller wafer and pouch 2 1/4" will work well for this pt.  I have changed this wafer out and cut to fit, placed in irrigation sleeve and proceeded to irrigate the superior limb of the stoma with 500 CC sterile water.  Pt very distended.  When I attempted to irrigate initially I was not able to get any of the irrigate into the stoma. 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  I was able to dilate this area enough to allow the irrigant to flow into the stoma.  Clamped sleeve and will check back with pt after 1hr. For output.  Supplies in room should we need to irrigate again. WOC will follow along with you for assistance with ostomy. Armen Pickup, RN [employer]  [phone]

## 2011-10-13 NOTE — Progress Notes (Signed)
Patient refused PICC Line today stated he wanted to wait and see if colostomy would start draining after irrigation and enema given through stoma site per Wound Colostomy Nurse and if it did than maybe he could take food by mouth, because he fells that he can eat, would like also to place the TNA on hold, Dr. Truitt Merle about patient's concerns and gave orders to hold PICC Line and TNA until 10/14/2011 when he make rounds on this patient

## 2011-10-13 NOTE — Progress Notes (Signed)
INITIAL ADULT NUTRITION ASSESSMENT Date: 10/13/2011   Time: 11:28 AM Reason for Assessment: Consult for new TNA  ASSESSMENT: Male 51 y.o.  Dx: SBO (small bowel obstruction)  Hx:  Past Medical History  Diagnosis Date  . Hypertension     no meds  . Cancer     rectal   . 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 enoxaparin  40 mg Subcutaneous Q24H  .  HYDROmorphone (DILAUDID) injection  0.5 mg Intravenous Once  .  HYDROmorphone (DILAUDID) injection  1 mg Intravenous Once  . insulin aspart  0-9 Units Subcutaneous Q6H  . metoprolol  5 mg Intravenous Q8H  . ondansetron  4 mg Intravenous Once  . piperacillin-tazobactam (ZOSYN)  IV  3.375 g Intravenous Q8H  . potassium chloride  10 mEq Intravenous Q1 Hr x 2  . sodium chloride  500 mL Intravenous Once   Continuous Infusions:   . 0.9 % NaCl with KCl 20 mEq / L 125 mL/hr at 10/12/11 2220  . 0.9 % NaCl with KCl 20 mEq / L    . TPN (CLINIMIX) +/- additives     And  . fat emulsion    . DISCONTD: sodium chloride 125 mL/hr at 10/12/11 1227  . DISCONTD: sodium chloride 125 mL/hr at 10/12/11 1411   PRN Meds:.acetaminophen, acetaminophen, HYDROmorphone (DILAUDID) injection, iohexol, lidocaine-prilocaine, nicotine, ondansetron   Ht: 5\' 5"  (165.1 cm)  Wt: 134 lb (60.782 kg)  Ideal Wt: 61.8 kg % Ideal Wt: 98.5% Wt Readings from Last 10 Encounters:  10/12/11 134 lb (60.782 kg)  10/07/11 138 lb 7.2 oz (62.8 kg)  10/07/11 138 lb 7.2 oz (62.8 kg)  09/29/11 137 lb 4.8 oz (62.279 kg)  09/27/11 138 lb (62.596 kg)  09/16/11 139 lb 7 oz (63.248 kg)  09/16/11 140 lb 11.2 oz (63.821 kg)  08/26/11 143 lb 9.6 oz (65.137 kg)  08/12/11 142 lb 11.2 oz (64.728 kg)  07/29/11 140 lb 11.2 oz (63.821 kg)  *Weight down 6 lb over 2 months, 4.3% from baseline.   Usual Wt: 142 lb per pt.  %  Usual Wt: 94.3%  Body mass index is 22.30 kg/(m^2). (WNL)  Food/Nutrition Related Hx: Patient reported his appetite and intake were well PTA. Noted pt with PO intake documented 50% at meals on 8/25. Noted per pharmacy, pt to start TNA tonight at 1800 after PICC is placed of Clinimix E 5/20 @ 40 ml/hr plus lipids 20% at 10 ml/hr MWF. To provide a weekly average of 1050 kcal and 48 grams of protein.   Labs:  CMP     Component Value Date/Time   NA 138 10/13/2011 0530   K 3.5 10/13/2011 0530   CL 99 10/13/2011 0530   CO2 29 10/13/2011 0530   GLUCOSE 129* 10/13/2011 0530   BUN 18 10/13/2011 0530   CREATININE 0.71 10/13/2011 0530   CALCIUM 8.5 10/13/2011 0530   PROT 7.9 10/12/2011 1320   ALBUMIN 3.3* 10/12/2011 1320   AST 13 10/12/2011 1320   ALT 8 10/12/2011 1320   ALKPHOS 64 10/12/2011 1320   BILITOT 0.4 10/12/2011 1320   GFRNONAA >90 10/13/2011 0530   GFRAA >90 10/13/2011 0530    Intake/Output Summary (Last 24 hours) at 10/13/11 1131 Last data filed at 10/13/11 0930  Gross per 24 hour  Intake  30 ml  Output   2550 ml  Net  -2520 ml     Diet Order: NPO  Supplements/Tube Feeding: none at this time.   IVF:    0.9 % NaCl with KCl 20 mEq / L Last Rate: 125 mL/hr at 10/12/11 2220  0.9 % NaCl with KCl 20 mEq / L   TPN (CLINIMIX) +/- additives   And   fat emulsion   DISCONTD: sodium chloride Last Rate: 125 mL/hr at 10/12/11 1227  DISCONTD: sodium chloride Last Rate: 125 mL/hr at 10/12/11 1411    Estimated Nutritional Needs:   Kcal: 6045-4098 Protein: 80-93 grams Fluid: 1 ml per kcal intake  NUTRITION DIAGNOSIS: -Inadequate oral intake (NI-2.1).  Status: Ongoing  RELATED TO: inability to eat and SBO  AS EVIDENCE BY: NPO status  MONITORING/EVALUATION(Goals): TNA per pharmacy, diet advancements/ tolerance, weights, labs 1. Recommend diet advancements as medically able.  2. TNA per pharmacy, recommend meet > 90% of estimated energy needs with nutrition support.  3. Minimize  weight loss.   EDUCATION NEEDS: -No education needs identified at this time  INTERVENTION: 1. TNA per pharmacy, recommend meet > 90% of estimated energy needs. Clinimix E 5/20 @ goal rate of 75 ml/hr would provide 1789 kcal and  90 grams of protein (Meets 100% of estimated kcal and protein needs).  2. RD to follow for nutrition plan of care.   Dietitian [phone]  DOCUMENTATION CODES Per approved criteria  -Not Applicable    Iven Finn [facility] 10/13/2011, 11:28 AM

## 2011-10-13 NOTE — Progress Notes (Signed)
Notified pharmacy and IV Team that PICC and TPN has been placed on hold until reevaluated tomorrow by the doctor per patient request

## 2011-10-13 NOTE — Progress Notes (Signed)
PARENTERAL NUTRITION CONSULT NOTE - INITIAL  Pharmacy Consult for TNA Indication: Prolonged ileus  No Known Allergies  Patient Measurements: Height: 5\' 5"  (165.1 cm) Weight: 134 lb (60.782 kg) [name] : 61.5  Adjusted Body Weight: not-calculated since TBW < IBW  Vital Signs: Temp: 98 F (36.7 C) (08/28 0517) Temp src: Oral (08/28 0517) BP: 172/110 mmHg (08/28 0517) Pulse Rate: 103  (08/28 0517) Intake/Output from previous day: 08/27 0701 - 08/28 0700 In: 30 [NG/GT:30] Out: 1600 [Urine:900; Emesis/NG output:700] Intake/Output from this shift:    Labs:  Basename 10/13/11 0530 10/12/11 1320 10/12/11 1100  WBC 12.0* 11.2* 12.6*  HGB 10.9* 11.1* 11.8*  HCT 34.0* 34.0* 36.3*  PLT 412* 419* 433*  APTT -- 33 --  INR -- 1.13 --     Basename 10/13/11 0530 10/12/11 1320 10/12/11 1100  NA 138 137 135  K 3.5 3.3* 3.7  CL 99 97 95*  CO2 29 30 28   GLUCOSE 129* 110* 142*  BUN 18 18 19   CREATININE 0.71 0.58 0.73  LABCREA -- -- --  CREAT24HRUR -- -- --  CALCIUM 8.5 8.9 9.4  MG -- 2.0 --  PHOS -- -- --  PROT -- 7.9 --  ALBUMIN -- 3.3* --  AST -- 13 --  ALT -- 8 --  ALKPHOS -- 64 --  BILITOT -- 0.4 --  BILIDIR -- -- --  IBILI -- -- --  PREALBUMIN -- -- --  TRIG -- -- --  CHOLHDL -- -- --  CHOL -- -- --   Estimated Creatinine Clearance: 92.9 ml/min (by C-G formula based on Cr of 0.71).   No results found for this basename: GLUCAP:3 in the last 72 hours  Medical History: Past Medical History  Diagnosis Date  . Hypertension     no meds  . Cancer     rectal   . 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 Pending RD recommendations.  Clinimix E5/20 @75  ml/hr +  IVF 20% on MWF will provide 90 grams of protein/day, 2064 Kcal/day on lipid days, 1584 Kcal on non-lipid days, and an average of 1790 KCal per average day of the week.   Current nutrition:   Diet: NPO  IVF: NS + 20 mEq/L KCl @ 125 ml/hr  Assessment:   51 yom with h/o rectal cancer, s/p percutaneous transgluteal abscess drain of intra abdominal abscess, diagnosed with rectal leak and underwent laparoscopic diverting loop descending colostomy/biopsies on 8/20.  Patient discharged home 8/25, presented 8/27 with nonfunctional ostomy.  Minimal PO intake since surgery. CT concerning for partial or early small bowel obstruction/ileus to start TNA per surgery 8/28.  PICC line ordered.    Renal/hepatic function: wnl  Electrolytes: wnl  Pre-Albumin: --  TG/Cholesterol: --  Plan:  At 1800 tonight, after PICC placement ...  Start Clinimix E 5/20 at 40 ml/hr.    TNA to contain IV fat emulsion, standard multivitamins and trace elements only on MWF only due to ongoing shortage  Reduce IVF to 85 ml/hr.  Add sensitive SSI q6h  2 runs of KCl to stay ahead  TNA labs Monday/Thursdays  Dietician consult  Pharmacy will follow up daily  Jeffery [name]  Thi 10/13/2011,7:06 AM

## 2011-10-13 NOTE — Progress Notes (Signed)
Subjective: Feels better with NGT in.  Objective: Vital signs in last 24 hours: Temp:  [98 F (36.7 C)-98.5 F (36.9 C)] 98 F (36.7 C) (08/28 0517) Pulse Rate:  [103-116] 103  (08/28 0517) Resp:  [16-20] 18  (08/28 0517) BP: (154-172)/(89-112) 172/110 mmHg (08/28 0517) SpO2:  [95 %-100 %] 97 % (08/28 0517) Weight:  [134 lb (60.782 kg)] 134 lb (60.782 kg) (08/27 1637)    Intake/Output from previous day: 08/27 0701 - 08/28 0700 In: 30 [NG/GT:30] Out: 1600 [Urine:900; Emesis/NG output:700] Intake/Output this shift: Total I/O In: 30 [NG/GT:30] Out: 1600 [Urine:900; Emesis/NG output:700]  General appearance: alert, cooperative and no distress Head: Normocephalic, without obvious abnormality, atraumatic GI: soft, distended, non tender Extremities: extremities normal, atraumatic, no cyanosis or edema  Lab Results:   Basename 10/13/11 0530 10/12/11 1320  WBC 12.0* 11.2*  HGB 10.9* 11.1*  HCT 34.0* 34.0*  PLT 412* 419*   BMET  Basename 10/13/11 0530 10/12/11 1320  NA 138 137  K 3.5 3.3*  CL 99 97  CO2 29 30  GLUCOSE 129* 110*  BUN 18 18  CREATININE 0.71 0.58  CALCIUM 8.5 8.9   PT/INR  Basename 10/12/11 1320  LABPROT 14.7  INR 1.13   ABG No results found for this basename: PHART:2,PCO2:2,PO2:2,HCO3:2 in the last 72 hours  Studies/Results: Ct Abdomen Pelvis W Contrast  10/12/2011  *RADIOLOGY REPORT*  Clinical Data: Rectal cancer.  Small bowel obstruction.  CT ABDOMEN AND PELVIS WITH CONTRAST  Technique:  Multidetector CT imaging of the abdomen and pelvis was performed following the standard protocol during bolus administration of intravenous contrast.  Contrast: 80mL OMNIPAQUE IOHEXOL 300 MG/ML  SOLN  Comparison: 09/16/2011  Findings: Scattered pulmonary nodules are present in the lung bases.  Index nodule 10 mm in diameter on image 14 of series 7 in the left lower lobe (formerly 9 mm).  Small hiatal hernia noted with distal esophageal wall thickening -  esophagitis is not excluded.  The catheter terminates in the right atrium.  No definite liver metastatic disease is observed.  Slightly nodular left adrenal gland without overt mass.  Spleen unremarkable.  Right mid kidney cyst noted anteriorly.  Markedly dilated loops of small bowel noted.  This extends down to a twisted loop of small bowel in the central pelvis, with twisting apparent on images 50-68 of series 4.  No definite extraluminal gas.  Presacral / perirectal abscess pigtail drainage catheter noted.  Mild ascites is most notable on the left pericolic gutter.  Bridging spurring of the sacroiliac joints noted.  IMPRESSION:  1.  Small bowel obstruction, with transition point at a twisted loop of bowel in the central pelvis. 2.  Perirectal drainage catheter. 3.  Essentially stable bibasilar metastatic nodules. 4.  Small amount of ascites. 5.  Small hiatal hernia and distal esophageal wall thickening, query esophagitis.   Original Report Authenticated By: Dellia Cloud, M.D.    Dg Abd 2 Views  10/12/2011  *RADIOLOGY REPORT*  Clinical Data: Status post recent colostomy.  Abdominal pain and vomiting.  Evaluate for potential obstruction.  ABDOMEN - 2 VIEW  Comparison: CT of the abdomen and pelvis 09/16/2011.  Findings: Multiple dilated loops of small bowel are noted, measuring up to approximately 4.8 cm in diameter.  On the upright projection, there are multiple air fluid levels.  No definite pneumoperitoneum is identified.  There is a large amount well formed stool in the cecum.  A paucity of stool is otherwise noted in the colon, and  there appears to be left lower quadrant colostomy.  A single surgical drain is in place with the pigtail reformed in the low anatomic pelvis.  Paucity of bowel gas in the lower abdomen and pelvis.  IMPRESSION: 1.  Nonspecific bowel gas pattern, concerning for partial or early small bowel obstruction, as above. 2.  No pneumoperitoneum. 3.  Postoperative changes and support  apparatus, as above.   Original Report Authenticated By: Florencia Reasons, M.D.     Anti-infectives: Anti-infectives     Start     Dose/Rate Route Frequency Ordered Stop   10/12/11 1400   piperacillin-tazobactam (ZOSYN) IVPB 3.375 g        3.375 g 12.5 mL/hr over 240 Minutes Intravenous Every 8 hours 10/12/11 1326            Assessment/Plan: s/p * No surgery found * NGT/NPO PICC TNA Rehydrate. Enemas per superior limb of ostomy  LOS: 1 day    [facility] 10/13/2011

## 2011-10-13 NOTE — Consult Note (Signed)
WOC follow up Pt has been irrigated over an hour ago with 500cc sterile water.  Little output except for return of irrigant.  Orders received to irrigate again now with 500cc again and then again in the am.  I have irrigated with ease an additional 500cc of sterile water. Left with irrigation sleeve in place and instructions with bedside nurse after 1 hour or so to take sleeve off, clean and leave in the bathroom for use in the am.  Bedside nursing to replace with regular ostomy pouch until WOC sees again in the am for next irrigation.  WOC will follow along with you. Masaichi Kracht [place], [place] [phone]

## 2011-10-14 ENCOUNTER — Inpatient Hospital Stay (HOSPITAL_COMMUNITY): Payer: Medicaid Other

## 2011-10-14 LAB — COMPREHENSIVE METABOLIC PANEL
Albumin: 3.1 g/dL — ABNORMAL LOW (ref 3.5–5.2)
Alkaline Phosphatase: 61 U/L (ref 39–117)
BUN: 18 mg/dL (ref 6–23)
Creatinine, Ser: 0.66 mg/dL (ref 0.50–1.35)
Potassium: 3.9 mEq/L (ref 3.5–5.1)
Total Protein: 7.6 g/dL (ref 6.0–8.3)

## 2011-10-14 LAB — CBC
HCT: 33.5 % — ABNORMAL LOW (ref 39.0–52.0)
Hemoglobin: 10.9 g/dL — ABNORMAL LOW (ref 13.0–17.0)
MCH: 30 pg (ref 26.0–34.0)
MCHC: 32.5 g/dL (ref 30.0–36.0)
RBC: 3.63 MIL/uL — ABNORMAL LOW (ref 4.22–5.81)

## 2011-10-14 LAB — DIFFERENTIAL
Basophils Relative: 0 % (ref 0–1)
Lymphs Abs: 0.6 10*3/uL — ABNORMAL LOW (ref 0.7–4.0)
Monocytes Absolute: 1.5 10*3/uL — ABNORMAL HIGH (ref 0.1–1.0)
Monocytes Relative: 12 % (ref 3–12)
Neutro Abs: 10.2 10*3/uL — ABNORMAL HIGH (ref 1.7–7.7)

## 2011-10-14 LAB — PREALBUMIN: Prealbumin: 11.8 mg/dL — ABNORMAL LOW (ref 17.0–34.0)

## 2011-10-14 LAB — TRIGLYCERIDES: Triglycerides: 101 mg/dL (ref <150)

## 2011-10-14 LAB — GLUCOSE, CAPILLARY: Glucose-Capillary: 123 mg/dL — ABNORMAL HIGH (ref 70–99)

## 2011-10-14 LAB — CHOLESTEROL, TOTAL: Cholesterol: 128 mg/dL (ref 0–200)

## 2011-10-14 LAB — MAGNESIUM: Magnesium: 1.9 mg/dL (ref 1.5–2.5)

## 2011-10-14 MED ORDER — HYDRALAZINE HCL 20 MG/ML IJ SOLN
20.0000 mg | INTRAMUSCULAR | Status: DC | PRN
  Administered 2011-10-14 – 2011-10-15 (×3): 20 mg via INTRAVENOUS
  Filled 2011-10-14 (×3): qty 1

## 2011-10-14 MED ORDER — CLONIDINE HCL 0.3 MG/24HR TD PTWK
0.3000 mg | MEDICATED_PATCH | TRANSDERMAL | Status: DC
  Administered 2011-10-14 – 2011-10-21 (×2): 0.3 mg via TRANSDERMAL
  Filled 2011-10-14 (×2): qty 1

## 2011-10-14 MED ORDER — CLINIMIX E/DEXTROSE (5/20) 5 % IV SOLN
INTRAVENOUS | Status: AC
  Administered 2011-10-14: 19:00:00 via INTRAVENOUS
  Filled 2011-10-14: qty 1000

## 2011-10-14 MED ORDER — SODIUM CHLORIDE 0.9 % IJ SOLN
10.0000 mL | INTRAMUSCULAR | Status: DC | PRN
  Administered 2011-10-14 – 2011-10-19 (×4): 10 mL

## 2011-10-14 NOTE — Progress Notes (Signed)
Patient ID: Jeffery [name], male   DOB: 07-25-1960, 51 y.o.   MRN: [id number]    Subjective: Pt did not want to do picc yesterday due to hoping irrigations would work quickly.  Objective: Vital signs in last 24 hours: Temp:  [98.2 F (36.8 C)-98.7 F (37.1 C)] 98.2 F (36.8 C) (08/29 0520) Pulse Rate:  [104-116] 115  (08/29 0520) Resp:  [18-20] 18  (08/29 0520) BP: (136-162)/(103-107) 136/103 mmHg (08/29 0520) SpO2:  [95 %-98 %] 95 % (08/29 0520)    Intake/Output from previous day: 08/28 0701 - 08/29 0700 In: -  Out: 2300 [Urine:1500; Emesis/NG output:400; Drains:50; Stool:350] Intake/Output this shift: Total I/O In: -  Out: 1300 [Urine:800; Emesis/NG output:500]  General appearance: alert, cooperative and no distress Head: Normocephalic, without obvious abnormality, atraumatic GI: soft, less distended, non tender Extremities: extremities normal, atraumatic, no cyanosis or edema  Lab Results:   Basename 10/14/11 0540 10/13/11 0530  WBC 12.4* 12.0*  HGB 10.9* 10.9*  HCT 33.5* 34.0*  PLT 413* 412*   BMET  Basename 10/14/11 0805 10/13/11 0530  NA 138 138  K 3.9 3.5  CL 100 99  CO2 30 29  GLUCOSE 116* 129*  BUN 18 18  CREATININE 0.66 0.71  CALCIUM 8.7 8.5   PT/INR  Basename 10/12/11 1320  LABPROT 14.7  INR 1.13   ABG No results found for this basename: PHART:2,PCO2:2,PO2:2,HCO3:2 in the last 72 hours  Studies/Results: Ct Abdomen Pelvis W Contrast  10/12/2011  *RADIOLOGY REPORT*  Clinical Data: Rectal cancer.  Small bowel obstruction.  CT ABDOMEN AND PELVIS WITH CONTRAST  Technique:  Multidetector CT imaging of the abdomen and pelvis was performed following the standard protocol during bolus administration of intravenous contrast.  Contrast: 80mL OMNIPAQUE IOHEXOL 300 MG/ML  SOLN  Comparison: 09/16/2011  Findings: Scattered pulmonary nodules are present in the lung bases.  Index nodule 10 mm in diameter on image 14 of series 7 in the left lower lobe (formerly 9  mm).  Small hiatal hernia noted with distal esophageal wall thickening - esophagitis is not excluded.  The catheter terminates in the right atrium.  No definite liver metastatic disease is observed.  Slightly nodular left adrenal gland without overt mass.  Spleen unremarkable.  Right mid kidney cyst noted anteriorly.  Markedly dilated loops of small bowel noted.  This extends down to a twisted loop of small bowel in the central pelvis, with twisting apparent on images 50-68 of series 4.  No definite extraluminal gas.  Presacral / perirectal abscess pigtail drainage catheter noted.  Mild ascites is most notable on the left pericolic gutter.  Bridging spurring of the sacroiliac joints noted.  IMPRESSION:  1.  Small bowel obstruction, with transition point at a twisted loop of bowel in the central pelvis. 2.  Perirectal drainage catheter. 3.  Essentially stable bibasilar metastatic nodules. 4.  Small amount of ascites. 5.  Small hiatal hernia and distal esophageal wall thickening, query esophagitis.   Original Report Authenticated By: Dellia Cloud, M.D.    Dg Abd 2 Views  10/14/2011  *RADIOLOGY REPORT*  Clinical Data: Abdominal pain.  Small bowel obstruction.  ABDOMEN - 2 VIEW  Comparison: CT scan and radiographs dated 10/12/2011  Findings: The NG tube loops into the stomach and then back up into the distal esophagus and needs to be repositioned.  There is persistent dilatation of multiple small bowel loops consistent with small bowel obstruction.  No free air.  The perirectal drainage catheter is again  noted.  IMPRESSION: Persistent small bowel obstruction.  NG tube tip is in the distal esophagus and needs to be repositioned.  Critical Value/emergent results were called by telephone at the time of interpretation on 10/14/2011 at 10:15 a.m. to the patient's nurse, [name], who verbally acknowledged these results.   Original Report Authenticated By: Gwynn Burly, M.D.      Anti-infectives: Anti-infectives     Start     Dose/Rate Route Frequency Ordered Stop   10/12/11 1400   piperacillin-tazobactam (ZOSYN) IVPB 3.375 g        3.375 g 12.5 mL/hr over 240 Minutes Intravenous Every 8 hours 10/12/11 1326            Assessment/Plan: s/p * No surgery found * NGT/NPO PICC TNA Rehydrate. Repeat irrigations via superior limb of ostomy  LOS: 2 days    [facility] 10/14/2011

## 2011-10-14 NOTE — Progress Notes (Signed)
Peripherally Inserted Central Catheter/Midline Placement  The IV Nurse has discussed with the patient and/or persons authorized to consent for the patient, the purpose of this procedure and the potential benefits and risks involved with this procedure.  The benefits include less needle sticks, lab draws from the catheter and patient may be discharged home with the catheter.  Risks include, but not limited to, infection, bleeding, blood clot (thrombus formation), and puncture of an artery; nerve damage and irregular heat beat.  Alternatives to this procedure were also discussed.  PICC/Midline Placement Documentation        Jeffery [name] 10/14/2011, 6:36 PM

## 2011-10-14 NOTE — Progress Notes (Signed)
PARENTERAL NUTRITION CONSULT NOTE - INITIAL  Pharmacy Consult for TNA Indication: Prolonged ileus  No Known Allergies  Patient Measurements: Height: 5\' 5"  (165.1 cm) Weight: 134 lb (60.782 kg) [name] : 61.5  Adjusted Body Weight: not-calculated since TBW < IBW  Vital Signs: Temp: 98.2 F (36.8 C) (08/29 0520) Temp src: Oral (08/29 0520) BP: 136/103 mmHg (08/29 0520) Pulse Rate: 115  (08/29 0520) Intake/Output from previous day: 08/28 0701 - 08/29 0700 In: -  Out: 2300 [Urine:1500; Emesis/NG output:400; Drains:50; Stool:350] Intake/Output from this shift:    Labs:  Basename 10/14/11 0540 10/13/11 0530 10/12/11 1320  WBC 12.4* 12.0* 11.2*  HGB 10.9* 10.9* 11.1*  HCT 33.5* 34.0* 34.0*  PLT 413* 412* 419*  APTT -- -- 33  INR -- -- 1.13     Basename 10/14/11 0805 10/13/11 0530 10/12/11 1320  NA 138 138 137  K 3.9 3.5 3.3*  CL 100 99 97  CO2 30 29 30   GLUCOSE 116* 129* 110*  BUN 18 18 18   CREATININE 0.66 0.71 0.58  LABCREA -- -- --  CREAT24HRUR -- -- --  CALCIUM 8.7 8.5 8.9  MG 1.9 -- 2.0  PHOS 3.2 -- --  PROT 7.6 -- 7.9  ALBUMIN 3.1* -- 3.3*  AST 16 -- 13  ALT 14 -- 8  ALKPHOS 61 -- 64  BILITOT 0.5 -- 0.4  BILIDIR -- -- --  IBILI -- -- --  PREALBUMIN -- -- --  TRIG -- -- --  CHOLHDL -- -- --  CHOL -- -- --   Estimated Creatinine Clearance: 92.9 ml/min (by C-G formula based on Cr of 0.66).    Basename 10/14/11 0631 10/14/11 0007 10/13/11 1758  GLUCAP 104* 94 98    Medical History: Past Medical History  Diagnosis Date  . Hypertension     no meds  . Cancer     rectal   . 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  Current nutrition:   Diet: NPO  IVF: NS + 20 mEq/L KCl @ 85 ml/hr  Assessment:   51 yom with h/o rectal cancer, s/p percutaneous transgluteal abscess drain of intra abdominal abscess, diagnosed with rectal leak and underwent laparoscopic diverting loop descending colostomy/biopsies on 8/20.  Patient discharged home 8/25, presented 8/27 with nonfunctional ostomy.  Minimal PO intake since surgery. CT concerning for partial or early small bowel obstruction/ileus to start TNA per surgery 8/28.  Of note, patient refused PICC placement and TNA 8/28 wanting MD reassessment.  Pharmacy received order to start TNA 8/29 - per RN, patient is agreeable with this plan.  Patient with PAC in place.   Renal/hepatic function: wnl  Electrolytes: wnl (will try to keep K above 4)  Pre-Albumin: pending   TG/Cholesterol: pending   Plan:  At 1800 tonight, after PICC placement ...  Start Clinimix E 5/20 at 40 ml/hr.    TNA to contain IV fat emulsion, standard multivitamins and trace elements only  on MWF only due to ongoing shortage  Continue IVF to 85 ml/hr.  Continue sensitive SSI q6h  TNA labs Monday/Thursdays  Pharmacy will follow up daily  Geoffry Paradise Thi 10/14/2011,8:53 AM

## 2011-10-14 NOTE — Progress Notes (Signed)
Call from Radiologist ng tube is curled and going back into the esophagus.  Tube removed and new ng re-inserted without diff.  Pt tol well and placement checked, hooked back up to low intermit wall suction as ordered.

## 2011-10-14 NOTE — Progress Notes (Signed)
Pt educated on the importance of not eating and drinking at this time. Pt is noncompliant and is still drinking fluids. Pt states " They are not about to starve me while I'm here". Pt's girlfriend asked for an apple juice and when nurse walked in the room the pt was drinking the juice.

## 2011-10-14 NOTE — Consult Note (Signed)
WOC follow up Pt proximal end of loop stoma irrigated with 500 cc sterile water this am per Dr. Arita Miss order.  Pt tolerated without problems. He did not report significant output after 2x irrigation yesterday. Reports large amount of water only.  He is still very distended. Irrigation sleeve in place will check on output in 1-2 hours.   Roselie Cirigliano [place], [place] [phone]

## 2011-10-14 NOTE — Consult Note (Signed)
WOC follow up Irrigated pt again into proximal limb of loop stoma with 500cc sterile water.  Pt had large volume of output from the earlier irrigation.  Did not have any formed stool with irrigation, only liquid but filled up irrigation sleeve.  Pt tolerated this second irrigation today, I will recheck him in 1 hour and place regular ostomy pouch.  Orders received to irrigate this pt 2x day.  Will have WOC perform tom and demonstrate to nursing staff. Andreah Goheen [place], [place] [phone]

## 2011-10-15 ENCOUNTER — Inpatient Hospital Stay (HOSPITAL_COMMUNITY): Payer: Medicaid Other

## 2011-10-15 LAB — BASIC METABOLIC PANEL
GFR calc non Af Amer: >90 mL/min (ref >90)
Glucose, Bld: 159 mg/dL — ABNORMAL HIGH (ref 70–99)
Potassium: 3.5 mEq/L (ref 3.5–5.1)
Sodium: 138 mEq/L (ref 135–145)

## 2011-10-15 LAB — MAGNESIUM: Magnesium: 2 mg/dL (ref 1.5–2.5)

## 2011-10-15 LAB — PHOSPHORUS: Phosphorus: 2 mg/dL — ABNORMAL LOW (ref 2.3–4.6)

## 2011-10-15 MED ORDER — FAT EMULSION 20 % IV EMUL
240.0000 mL | INTRAVENOUS | Status: AC
  Administered 2011-10-15: 240 mL via INTRAVENOUS
  Filled 2011-10-15: qty 250

## 2011-10-15 MED ORDER — POTASSIUM PHOSPHATE DIBASIC 3 MMOLE/ML IV SOLN
10.0000 mmol | Freq: Once | INTRAVENOUS | Status: AC
  Administered 2011-10-15: 10 mmol via INTRAVENOUS
  Filled 2011-10-15: qty 3.33

## 2011-10-15 MED ORDER — ZINC TRACE METAL 1 MG/ML IV SOLN
INTRAVENOUS | Status: AC
  Administered 2011-10-15: 18:00:00 via INTRAVENOUS
  Filled 2011-10-15: qty 1000

## 2011-10-15 NOTE — Consult Note (Signed)
Ostomy follow-up: Irrigated ostomy as requested with 500cc water.  Stoma red and viable, slightly above skin level. Pt tolerated without c/o cramping or discomfort.  Finger inserted to check for impaction or presence of stool revealed narrowing of stoma but no retained stool.  Beginning to drain brown liquid with few small chunks after irrigation completed.  Sleeve left in place at this time.  Encouraged pt to ambulate in room to stimulate bowel action.  Bedside nurse present for irrigation session and states she will perform procedure this afternoon.  Pt very knowledgeable regarding process after watching several times and assists with applying sleeve and reapplying pouch when completed.    WOC team will not be available on Sat or Sun.  If colostomy irrigation orders are to be continued, staff nurses will have to perform procedure during the weekend.  Cammie Mcgee, RN, MSN, [employer]  [phone]

## 2011-10-15 NOTE — Progress Notes (Signed)
Patient ID: Jeffery [name], male   DOB: April 09, 1960, 51 y.o.   MRN: [id number]    Subjective: Pt got Picc and TNA yesterday.  Got some stool with irrigations.    Objective: Vital signs in last 24 hours: Temp:  [98.2 F (36.8 C)-98.9 F (37.2 C)] 98.2 F (36.8 C) (08/30 0523) Pulse Rate:  [118-122] 120  (08/30 0523) Resp:  [18-20] 19  (08/30 0523) BP: (136-171)/(93-108) 136/93 mmHg (08/30 0711) SpO2:  [93 %-96 %] 95 % (08/30 0523)    Intake/Output from previous day: 10-23-2022 0701 - 08/30 0700 In: 3979.6 [I.V.:3188.9; IV Piggyback:350; TPN:440.7] Out: 3700 [Urine:1200; Emesis/NG output:2200; Stool:300] Intake/Output this shift:    General appearance: alert, cooperative and no distress Head: Normocephalic, without obvious abnormality, atraumatic GI: soft, less distended, non tender, some flatus in ostomy.   Extremities: extremities normal, atraumatic, no cyanosis or edema  Lab Results:   Basename 10/23/11 0540 10/13/11 0530  WBC 12.4* 12.0*  HGB 10.9* 10.9*  HCT 33.5* 34.0*  PLT 413* 412*   BMET  Basename 23-Oct-2011 0805 10/13/11 0530  NA 138 138  K 3.9 3.5  CL 100 99  CO2 30 29  GLUCOSE 116* 129*  BUN 18 18  CREATININE 0.66 0.71  CALCIUM 8.7 8.5   PT/INR  Basename 10/12/11 1320  LABPROT 14.7  INR 1.13   ABG No results found for this basename: PHART:2,PCO2:2,PO2:2,HCO3:2 in the last 72 hours  Studies/Results: Dg Abd 2 Views  10-23-2011  *RADIOLOGY REPORT*  Clinical Data: Abdominal pain.  Small bowel obstruction.  ABDOMEN - 2 VIEW  Comparison: CT scan and radiographs dated 10/12/2011  Findings: The NG tube loops into the stomach and then back up into the distal esophagus and needs to be repositioned.  There is persistent dilatation of multiple small bowel loops consistent with small bowel obstruction.  No free air.  The perirectal drainage catheter is again noted.  IMPRESSION: Persistent small bowel obstruction.  NG tube tip is in the distal esophagus and needs to be  repositioned.  Critical Value/emergent results were called by telephone at the time of interpretation on 10/23/2011 at 10:15 a.m. to the patient's nurse, Jeffery [name], who verbally acknowledged these results.   Original Report Authenticated By: Jeffery [name], M.D.     Anti-infectives: Anti-infectives     Start     Dose/Rate Route Frequency Ordered Stop   10/12/11 1400   piperacillin-tazobactam (ZOSYN) IVPB 3.375 g        3.375 g 12.5 mL/hr over 240 Minutes Intravenous Every 8 hours 10/12/11 1326            Assessment/Plan: s/p * No surgery found * NGT/NPO PICC TNA Rehydrate. Repeat irrigations via superior limb of ostomy Repeat KUB to eval NGT placement.    LOS: 3 days    Jeffery [name] 10/15/2011

## 2011-10-15 NOTE — Progress Notes (Addendum)
PARENTERAL NUTRITION CONSULT NOTE - INITIAL  Pharmacy Consult for TNA Indication: Prolonged ileus  No Known Allergies  Patient Measurements: Height: 5\' 5"  (165.1 cm) Weight: 134 lb (60.782 kg) [name] : 61.5  Adjusted Body Weight: not-calculated since TBW < IBW  Vital Signs: Temp: 98.2 F (36.8 C) (08/30 0523) Temp src: Oral (08/30 0523) BP: 136/93 mmHg (08/30 0711) Pulse Rate: 120  (08/30 0523) Intake/Output from previous day: 08/29 0701 - 08/30 0700 In: 3979.6 [I.V.:3188.9; IV Piggyback:350; TPN:440.7] Out: 3700 [Urine:1200; Emesis/NG output:2200; Stool:300] Intake/Output from this shift:    Labs:  [facility] 10/14/11 0540 10/13/11 0530 10/12/11 1320  WBC 12.4* 12.0* 11.2*  HGB 10.9* 10.9* 11.1*  HCT 33.5* 34.0* 34.0*  PLT 413* 412* 419*  APTT -- -- 33  INR -- -- 1.13     Basename 10/14/11 0805 10/14/11 0800 10/13/11 0530 10/12/11 1320  NA 138 -- 138 137  K 3.9 -- 3.5 3.3*  CL 100 -- 99 97  CO2 30 -- 29 30  GLUCOSE 116* -- 129* 110*  BUN 18 -- 18 18  CREATININE 0.66 -- 0.71 0.58  LABCREA -- -- -- --  CREAT24HRUR -- -- -- --  CALCIUM 8.7 -- 8.5 8.9  MG 1.9 -- -- 2.0  PHOS 3.2 -- -- --  PROT 7.6 -- -- 7.9  ALBUMIN 3.1* -- -- 3.3*  AST 16 -- -- 13  ALT 14 -- -- 8  ALKPHOS 61 -- -- 64  BILITOT 0.5 -- -- 0.4  BILIDIR -- -- -- --  IBILI -- -- -- --  PREALBUMIN 11.8* -- -- --  TRIG -- 101 -- --  CHOLHDL -- -- -- --  CHOL -- 128 -- --   Estimated Creatinine Clearance: 92.9 ml/min (by C-G formula based on Cr of 0.66).    Basename 10/14/11 1722 10/14/11 1141 10/14/11 0631  GLUCAP 103* 123* 104*    Medical History: Past Medical History  Diagnosis Date  . Hypertension     no meds  . Cancer     rectal   . 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  Current nutrition:   Diet: NPO  IVF: NS + 20 mEq/L KCl @ 85 ml/hr  Assessment:   51 yom with h/o rectal cancer, s/p percutaneous transgluteal abscess drain of intra abdominal abscess, diagnosed with rectal leak and underwent laparoscopic diverting loop descending colostomy/biopsies on 8/20.  Patient discharged home 8/25, presented 8/27 with nonfunctional ostomy.  Minimal PO intake since surgery. CT concerning for partial or early small bowel obstruction/ileus to start TNA per surgery 8/28.  Of note, patient refused PICC placement and TNA 8/28 wanting MD reassessment.  Pharmacy received order to start TNA 8/29 - per RN, patient is agreeable with this plan.  Patient with PAC in place.   Renal/hepatic function: wnl  Electrolytes: wnl except phosphorous is slightly below goal, will replace.   Pre-Albumin: 11.8 on 8/29  TG/Cholesterol: wnl on 8/29  Plan:    Continue Clinimix E 5/20 at 40 ml/hr.  Will hold off on advancement given low phos today.   Replace Phosphorous 10 mmol x 1 dose today   TNA to contain IV fat emulsion, standard multivitamins and trace elements only on MWF only due to ongoing shortage  Continue IVF to 85 ml/hr.  Continue sensitive SSI q6h  TNA labs Monday/Thursdays  Pharmacy will follow up daily  Marshal Schrecengost, Loma Messing PharmD Pager #: [phone] 9:19 AM 10/15/2011

## 2011-10-15 NOTE — Progress Notes (Signed)
Pt refused to have ostomy irrigated at this time, states that he still feels it moving around from the last time.

## 2011-10-15 NOTE — Progress Notes (Addendum)
ANTIBIOTIC CONSULT NOTE - Follow Up  Pharmacy Consult for Zosyn Indication: perirectal abscess/fistula No Known Allergies  Patient Measurements: Height: 5\' 5"  (165.1 cm) Weight: 134 lb (60.782 kg) [name] : 61.5  63kg  Vital Signs: Temp: 98.2 F (36.8 C) (08/30 0523) Temp src: Oral (08/30 0523) BP: 136/93 mmHg (08/30 0711) Pulse Rate: 120  (08/30 0523) Intake/Output from previous day: 08/29 0701 - 08/30 0700 In: 3979.6 [I.V.:3188.9; IV Piggyback:350; TPN:440.7] Out: 3700 [Urine:1200; Emesis/NG output:2200; Stool:300] Intake/Output from this shift:    Labs:  Basename 10/14/11 0805 10/14/11 0540 10/13/11 0530 10/12/11 1320  WBC -- 12.4* 12.0* 11.2*  HGB -- 10.9* 10.9* 11.1*  PLT -- 413* 412* 419*  LABCREA -- -- -- --  CREATININE 0.66 -- 0.71 0.58   Estimated Creatinine Clearance: 92.9 ml/min (by C-G formula based on Cr of 0.66). No results found for this basename: VANCOTROUGH:2,VANCOPEAK:2,VANCORANDOM:2,GENTTROUGH:2,GENTPEAK:2,GENTRANDOM:2,TOBRATROUGH:2,TOBRAPEAK:2,TOBRARND:2,AMIKACINPEAK:2,AMIKACINTROU:2,AMIKACIN:2, in the last 72 hours   Microbiology: Recent Results (from the past 720 hour(s))  CULTURE, ROUTINE-ABSCESS     Status: Normal   Collection Time   09/18/11  7:45 AM      Component Value Range Status Comment   Specimen Description DRAINAGE CT R TRANSGLUTEAL ABSCESS DRAIN CATHETER   Final    Special Requests NONE   Final    Gram Stain     Final    Value: ABUNDANT WBC PRESENT, PREDOMINANTLY PMN     NO SQUAMOUS EPITHELIAL CELLS SEEN     ABUNDANT GRAM VARIABLE ROD     FEW GRAM POSITIVE COCCI IN PAIRS AND CHAINS   Culture ABUNDANT ESCHERICHIA COLI   Final    Report Status 09/20/2011 FINAL   Final    Organism ID, Bacteria ESCHERICHIA COLI   Final   SURGICAL PCR SCREEN     Status: Normal   Collection Time   09/29/11  8:46 AM      Component Value Range Status Comment   MRSA, PCR NEGATIVE  NEGATIVE Final    Staphylococcus aureus NEGATIVE  NEGATIVE  Final     Medical History: Past Medical History  Diagnosis Date  . Hypertension     no meds  . Cancer     rectal   . 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 amLODipine (NORVASC) 5 MG tablet Take 2 tablets (10 mg total) by mouth daily.  30 tablet  3  . amoxicillin-clavulanate (AUGMENTIN) 875-125 MG per tablet Take 1 tablet by mouth 2 (two) times daily. pts on day 2 of therapy      . lidocaine-prilocaine (EMLA) cream Apply topically as needed. Apply to Jeffery [name] site 1-2 hours prior to stick and cover with plastic wrap  30 g  prn  . oxyCODONE-acetaminophen (PERCOCET/ROXICET) 5-325 MG per tablet Take 1-2 tablets by mouth every 3 (three) hours as needed.  30 tablet  0  . paregoric 2 MG/5ML solution Take 5 mLs by mouth 4 (four) times daily as needed. #163ml --      . PRESCRIPTION MEDICATION Pt's a chemo patient and gets chemo at [facility]. Last treatment was 2 months ago. Pt states his next scheduled chemo is in September 2013. Followed by Dr Truett Perna      . prochlorperazine (COMPAZINE) 10 MG tablet Take 1 tablet (10 mg total) by mouth every 6 (six) hours as  needed (nausea).  60 tablet  1   Anti-infectives     Start     Dose/Rate Route Frequency Ordered Stop   10/12/11 1400   piperacillin-tazobactam (ZOSYN) IVPB 3.375 g        3.375 g 12.5 mL/hr over 240 Minutes Intravenous Every 8 hours 10/12/11 1326           Assessment:  51 YOM w/hx rectal Ca with recent admission 8/20-25 for colostomy.   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   Was d/c 8/25 on augmentin, received IV Flagyl 500mg  q8h 8/20-25)  Scr wnl, WBC remains slightly elevated. Afebrile  No  cultures  Goal of Therapy:  Appropriate dose of Zosyn  Plan:   Zosyn 3.375g IV q8h, each dose over 4 hours  Will continue to follow renal function and f/u LOT.   Jeffery [name], Loma Messing PharmD Pager #: [phone] 7:30 AM 10/15/2011

## 2011-10-16 LAB — CBC
HCT: 33.9 % — ABNORMAL LOW (ref 39.0–52.0)
MCHC: 32.2 g/dL (ref 30.0–36.0)
Platelets: 425 10*3/uL — ABNORMAL HIGH (ref 150–400)
RDW: 15.1 % (ref 11.5–15.5)
WBC: 11.6 10*3/uL — ABNORMAL HIGH (ref 4.0–10.5)

## 2011-10-16 LAB — BASIC METABOLIC PANEL
BUN: 17 mg/dL (ref 6–23)
Chloride: 96 mEq/L (ref 96–112)
GFR calc Af Amer: >90 mL/min (ref >90)
GFR calc non Af Amer: >90 mL/min (ref >90)
Potassium: 3.3 mEq/L — ABNORMAL LOW (ref 3.5–5.1)
Sodium: 138 mEq/L (ref 135–145)

## 2011-10-16 LAB — PHOSPHORUS: Phosphorus: 3.4 mg/dL (ref 2.3–4.6)

## 2011-10-16 LAB — GLUCOSE, CAPILLARY
Glucose-Capillary: 142 mg/dL — ABNORMAL HIGH (ref 70–99)
Glucose-Capillary: 127 mg/dL — ABNORMAL HIGH (ref 70–99)

## 2011-10-16 MED ORDER — POTASSIUM CHLORIDE IN NACL 20-0.9 MEQ/L-% IV SOLN
INTRAVENOUS | Status: AC
  Administered 2011-10-16: 10:00:00 via INTRAVENOUS
  Filled 2011-10-16: qty 1000

## 2011-10-16 MED ORDER — CLINIMIX E/DEXTROSE (5/20) 5 % IV SOLN
INTRAVENOUS | Status: AC
  Administered 2011-10-16: 18:00:00 via INTRAVENOUS
  Filled 2011-10-16: qty 2000

## 2011-10-16 MED ORDER — POTASSIUM CHLORIDE IN NACL 20-0.9 MEQ/L-% IV SOLN
INTRAVENOUS | Status: AC
  Administered 2011-10-17: 12:00:00 via INTRAVENOUS
  Filled 2011-10-16 (×2): qty 1000

## 2011-10-16 MED ORDER — POTASSIUM CHLORIDE 10 MEQ/100ML IV SOLN
10.0000 meq | INTRAVENOUS | Status: AC
  Administered 2011-10-16 (×4): 10 meq via INTRAVENOUS
  Filled 2011-10-16 (×4): qty 100

## 2011-10-16 MED ORDER — POTASSIUM CHLORIDE IN NACL 20-0.9 MEQ/L-% IV SOLN
INTRAVENOUS | Status: DC
  Filled 2011-10-16: qty 1000

## 2011-10-16 NOTE — Progress Notes (Signed)
PARENTERAL NUTRITION CONSULT NOTE - INITIAL  Pharmacy Consult for TNA Indication: Prolonged ileus  No Known Allergies  Patient Measurements: Height: 5\' 5"  (165.1 cm) Weight: 134 lb (60.782 kg) [name] : 61.5  Adjusted Body Weight: not-calculated since TBW < IBW  Vital Signs: Temp: 98.2 F (36.8 C) (08/31 0549) Temp src: Oral (08/31 0549) BP: 149/99 mmHg (08/31 0549) Pulse Rate: 112  (08/31 0549) Intake/Output from previous day: 08/30 0701 - 08/31 0700 In: 2398.5 [I.V.:1379.8; IV Piggyback:353.3; TPN:665.3] Out: 1500 [Emesis/NG output:1500] Intake/Output from this shift:    Labs:  [facility] 10/16/11 0410 10/14/11 0540  WBC 11.6* 12.4*  HGB 10.9* 10.9*  HCT 33.9* 33.5*  PLT 425* 413*  APTT -- --  INR -- --     Basename 10/16/11 0710 10/15/11 0800 10/14/11 0805 10/14/11 0800  NA 138 138 138 --  K 3.3* 3.5 3.9 --  CL 96 100 100 --  CO2 34* 32 30 --  GLUCOSE 149* 159* 116* --  BUN 17 14 18  --  CREATININE 0.61 0.57 0.66 --  LABCREA -- -- -- --  CREAT24HRUR -- -- -- --  CALCIUM 9.0 8.4 8.7 --  MG -- 2.0 1.9 --  PHOS 3.4 2.0* 3.2 --  PROT -- -- 7.6 --  ALBUMIN -- -- 3.1* --  AST -- -- 16 --  ALT -- -- 14 --  ALKPHOS -- -- 61 --  BILITOT -- -- 0.5 --  BILIDIR -- -- -- --  IBILI -- -- -- --  PREALBUMIN -- -- 11.8* --  TRIG -- -- -- 101  CHOLHDL -- -- -- --  CHOL -- -- -- 128   Estimated Creatinine Clearance: 92.9 ml/min (by C-G formula based on Cr of 0.61).    Basename 10/14/11 1722 10/14/11 1141 10/14/11 0631  GLUCAP 103* 123* 104*    Medical History: Past Medical History  Diagnosis Date  . Hypertension     no meds  . Cancer     rectal   . 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  Current nutrition:   Diet: NPO  IVF: NS + 20 mEq/L KCl @ 85 ml/hr  Assessment:   51 yom with h/o rectal cancer, s/p percutaneous transgluteal abscess drain of intra abdominal abscess, diagnosed with rectal leak and underwent laparoscopic diverting loop descending colostomy/biopsies on 8/20.  Patient discharged home 8/25, presented 8/27 with nonfunctional ostomy.  Minimal PO intake since surgery. CT concerning for partial or early small bowel obstruction/ileus to start TNA per surgery 8/28.  Of note, patient refused PICC placement and TNA 8/28 wanting MD reassessment.  Pharmacy received order to start TNA 8/29 - per RN, patient is agreeable with this plan.  Patient with PAC in place.   Renal/hepatic function: wnl  Electrolytes: wnl except potassium slightly low 3.3; Phosphorus improved after replacement yesterday   Pre-Albumin: 11.8  on 8/29  TG/Cholesterol: wnl on 8/29  Plan:    Advance Clinimix E 5/20 to 60 ml/hr at 1800 tonight  KCL 10 mEq IV x 4 runs for a total of 40 mEq  TNA to contain IV fat emulsion, standard multivitamins and trace elements only on MWF only due to ongoing shortage  Decrease IVF to 65 ml/hr.  Continue sensitive SSI q6h  TNA labs Monday/Thursdays  Pharmacy will follow up daily  Jannie Doyle, Loma Messing PharmD Pager #: [phone] 9:14 AM 10/16/2011

## 2011-10-16 NOTE — Progress Notes (Signed)
Patient was offered and suggest irrigation of his colostomy but continues to decline.

## 2011-10-16 NOTE — Progress Notes (Signed)
ETHON WYMER [id number] 11-18-1960   Subjective:  Less sore Walking a little Wife at bedside  Objective:  Vital signs:  Filed Vitals:   10/15/11 1451 10/15/11 1523 10/15/11 2057 10/16/11 0549  BP: 154/102 150/92 135/86 149/99  Pulse: 123  128 112  Temp: 98 F (36.7 C)  99.4 F (37.4 C) 98.2 F (36.8 C)  TempSrc: Oral  Oral Oral  Resp: 18  18 18   Height:      Weight:      SpO2: 96%  96% 93%       Intake/Output   Yesterday:  08/30 0701 - 08/31 0700 In: 2398.5 [I.V.:1379.8; IV Piggyback:353.3; TPN:665.3] Out: 1500 [Emesis/NG output:1500] This shift:     Bowel function:  Flatus: n  BM: n  Physical Exam:  General: Pt awake/alert/oriented x4 in no acute distress Eyes: PERRL, normal EOM.  Sclera clear.  No icterus Neuro: CN II-XII intact w/o focal sensory/motor deficits. Lymph: No head/neck/groin lymphadenopathy Psych:  No delerium/psychosis/paranoia HENT: Normocephalic, Mucus membranes moist.  No thrush Neck: Supple, No tracheal deviation Chest: No chest wall pain w good excursion CV:  Pulses intact.  Regular rhythm Abdomen: Soft.  Mod distended - but less than on admission.  Nontender.  Ostomy oink w scant gas.  No incarcerated hernias. Ext:  SCDs BLE.  No mjr edema.  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 male       SBO with colonic constipation  Plan:  -ostomy enemas/irrigation -NGT  TNA for malnutrition -IV ABX for abscess/cellulitis -VTE prophylaxis- SCDs, etc -mobilize as tolerated to help recovery  Ardeth Sportsman, M.D., F.A.C.S. Gastrointestinal and Minimally Invasive Surgery [facility] [address] [place],  [place] [address] [phone] Main / Paging [phone] Voice Mail   10/16/2011  CARE TEAM:  PCP: No primary provider on file.  Outpatient Care Team: Patient Care Team: Almond Lint, MD as Consulting Physician (General Surgery) Ladene Artist, MD as Consulting Physician (Medical Oncology)  Inpatient Treatment Team: Treatment Team: Attending Provider: Almond Lint, MD; Registered Nurse: Vivi Ferns, RN; Technician: Ronal Fear; Registered Nurse: Vassie Loll, RN; Registered Nurse: Molli Barrows, RN; Registered Nurse: Threasa Heads, RN; Registered Nurse: Hewitt Blade, RN; Registered Nurse: Bebe Shaggy, RN   Results:   Labs: Results for orders placed during the hospital encounter of 10/12/11 (from the past 48 hour(s))  GLUCOSE, CAPILLARY     Status: Abnormal   Collection Time   10/14/11  5:22 PM      Component Value Range Comment   Glucose-Capillary 103 (*) 70 - 99 mg/dL    Comment 1 Notify RN     BASIC METABOLIC PANEL     Status: Abnormal   Collection Time   10/15/11  8:00 AM      Component Value Range Comment   Sodium 138  135 - 145 mEq/L    Potassium 3.5  3.5 - 5.1 mEq/L    Chloride 100  96 - 112 mEq/L    CO2 32  19 - 32 mEq/L    Glucose, Bld 159 (*) 70 - 99 mg/dL    BUN 14  6 - 23 mg/dL    Creatinine,  Ser 0.57  0.50 - 1.35 mg/dL    Calcium 8.4  8.4 - 65.7 mg/dL    GFR calc non Af Amer >90  >90 mL/min    GFR calc Af Amer >90  >90 mL/min   MAGNESIUM     Status: Normal   Collection Time   10/15/11  8:00 AM      Component Value Range Comment   Magnesium 2.0  1.5 - 2.5 mg/dL   PHOSPHORUS     Status: Abnormal   Collection Time   10/15/11  8:00 AM      Component Value Range Comment   Phosphorus 2.0 (*) 2.3 - 4.6 mg/dL   CBC     Status: Abnormal   Collection Time   10/16/11  4:10 AM      Component Value Range Comment   WBC 11.6 (*) 4.0 - 10.5 K/uL    RBC 3.68 (*) 4.22 - 5.81 MIL/uL    Hemoglobin 10.9 (*) 13.0 - 17.0 g/dL    HCT  84.6 (*) 96.2 - 52.0 %    MCV 92.1  78.0 - 100.0 fL    MCH 29.6  26.0 - 34.0 pg    MCHC 32.2  30.0 - 36.0 g/dL    RDW 95.2  84.1 - 32.4 %    Platelets 425 (*) 150 - 400 K/uL   BASIC METABOLIC PANEL     Status: Abnormal   Collection Time   10/16/11  7:10 AM      Component Value Range Comment   Sodium 138  135 - 145 mEq/L    Potassium 3.3 (*) 3.5 - 5.1 mEq/L    Chloride 96  96 - 112 mEq/L    CO2 34 (*) 19 - 32 mEq/L    Glucose, Bld 149 (*) 70 - 99 mg/dL    BUN 17  6 - 23 mg/dL    Creatinine, Ser 4.01  0.50 - 1.35 mg/dL    Calcium 9.0  8.4 - 02.7 mg/dL    GFR calc non Af Amer >90  >90 mL/min    GFR calc Af Amer >90  >90 mL/min   PHOSPHORUS     Status: Normal   Collection Time   10/16/11  7:10 AM      Component Value Range Comment   Phosphorus 3.4  2.3 - 4.6 mg/dL   GLUCOSE, CAPILLARY     Status: Abnormal   Collection Time   10/16/11  9:59 AM      Component Value Range Comment   Glucose-Capillary 142 (*) 70 - 99 mg/dL    Comment 1 Notify RN       Imaging / Studies: Dg Abd 2 Views  10/15/2011  *RADIOLOGY REPORT*  Clinical Data: Small bowel obstruction. A rectal carcinoma  ABDOMEN - 2 VIEW  Comparison: 08/14/2011  Findings: Nasogastric tube tip remains in the proximal stomach. Mild decrease in degree of small bowel dilatation is seen.  There are multiple persistent air fluid levels.  There is a paucity of colonic gas.  No free air identified.  Percutaneous drainage catheter again seen within the inferior pelvis.  IMPRESSION: Persistent small bowel obstruction, with mild decrease in degree of small bowel dilatation.   Original Report Authenticated By: Danae Orleans, M.D.     Medications / Allergies: per chart  Antibiotics: Anti-infectives     Start     Dose/Rate Route Frequency Ordered Stop   10/12/11 1400  piperacillin-tazobactam (ZOSYN) IVPB 3.375 g  3.375 g 12.5 mL/hr over 240 Minutes Intravenous Every 8 hours 10/12/11 1326

## 2011-10-17 LAB — GLUCOSE, CAPILLARY: Glucose-Capillary: 139 mg/dL — ABNORMAL HIGH (ref 70–99)

## 2011-10-17 LAB — BASIC METABOLIC PANEL
CO2: 34 mEq/L — ABNORMAL HIGH (ref 19–32)
Calcium: 9.1 mg/dL (ref 8.4–10.5)
Glucose, Bld: 141 mg/dL — ABNORMAL HIGH (ref 70–99)
Sodium: 138 mEq/L (ref 135–145)

## 2011-10-17 MED ORDER — CLINIMIX E/DEXTROSE (5/20) 5 % IV SOLN
INTRAVENOUS | Status: AC
  Administered 2011-10-17: 18:00:00 via INTRAVENOUS
  Filled 2011-10-17: qty 2000

## 2011-10-17 MED ORDER — POTASSIUM CHLORIDE IN NACL 20-0.9 MEQ/L-% IV SOLN
INTRAVENOUS | Status: DC
  Administered 2011-10-18 – 2011-10-20 (×3): via INTRAVENOUS
  Filled 2011-10-17 (×5): qty 1000

## 2011-10-17 NOTE — Progress Notes (Signed)
Patient ID: Jeffery [name], male   DOB: Mar 29, 1960, 51 y.o.   MRN: [id number]  General Surgery - [facility] - Progress Note  POD# 12  Subjective: Patient more comfortable.  No nausea or emesis.  Feels less distended.  NG still in place.  Objective: Vital signs in last 24 hours: Temp:  [98.1 F (36.7 C)-98.6 F (37 C)] 98.6 F (37 C) (09/01 0722) Pulse Rate:  [109-112] 112  (09/01 0722) Resp:  [18] 18  (09/01 0722) BP: (132-141)/(91-103) 132/94 mmHg (09/01 0722) SpO2:  [95 %-98 %] 95 % (09/01 0722) Last BM Date: 10/17/11  Intake/Output from previous day: 08/31 0701 - 09/01 0700 In: 99 [IV Piggyback:99] Out: 3400 [Urine:250; Emesis/NG output:2350; Stool:800]  Exam: HEENT - clear, not icteric Neck - soft Chest - clear bilaterally Cor - RRR, no murmur Abd - moderate distension, tense; quiet - rare BS; stoma viable left abd wall - minimal output Ext - no significant edema Neuro - grossly intact, no focal deficits  Lab Results:   Basename 10/16/11 0410  WBC 11.6*  HGB 10.9*  HCT 33.9*  PLT 425*     Basename 10/17/11 0855 10/16/11 0710  NA 138 138  K 3.6 3.3*  CL 96 96  CO2 34* 34*  GLUCOSE 141* 149*  BUN 16 17  CREATININE 0.70 0.61  CALCIUM 9.1 9.0    Studies/Results: No results found.  Assessment / Plan: 1.  SBO after loop colostomy  - check labs in AM 9/2  - check AXR in AM 9/2  - OOB, ambulation encouraged  - continue NG, IVF  Velora Heckler, MD, [facility] Office: [phone]  10/17/2011

## 2011-10-17 NOTE — Progress Notes (Signed)
At 1400 pt ambulated the length of the hallway about 6 times with no problems.

## 2011-10-17 NOTE — Progress Notes (Signed)
PARENTERAL NUTRITION CONSULT NOTE - INITIAL  Pharmacy Consult for TNA Indication: Prolonged ileus  No Known Allergies  Patient Measurements: Height: 5\' 5"  (165.1 cm) Weight: 134 lb (60.782 kg) [name] : 61.5  Adjusted Body Weight: not-calculated since TBW < IBW  Vital Signs: Temp: 98.5 F (36.9 C) (08/31 2226) Temp src: Oral (08/31 2226) BP: 140/103 mmHg (08/31 2226) Pulse Rate: 110  (08/31 2226) Intake/Output from previous day: 08/31 0701 - 09/01 0700 In: 99 [IV Piggyback:99] Out: 3400 [Urine:250; Emesis/NG output:2350; Stool:800] Intake/Output from this shift: Total I/O In: 99 [IV Piggyback:99] Out: 1650 [Emesis/NG output:850; Stool:800]  Labs:  [facility] 10/16/11 0410  WBC 11.6*  HGB 10.9*  HCT 33.9*  PLT 425*  APTT --  INR --     Basename 10/16/11 0710 10/15/11 0800 10/14/11 0805 10/14/11 0800  NA 138 138 138 --  K 3.3* 3.5 3.9 --  CL 96 100 100 --  CO2 34* 32 30 --  GLUCOSE 149* 159* 116* --  BUN 17 14 18  --  CREATININE 0.61 0.57 0.66 --  LABCREA -- -- -- --  CREAT24HRUR -- -- -- --  CALCIUM 9.0 8.4 8.7 --  MG -- 2.0 1.9 --  PHOS 3.4 2.0* 3.2 --  PROT -- -- 7.6 --  ALBUMIN -- -- 3.1* --  AST -- -- 16 --  ALT -- -- 14 --  ALKPHOS -- -- 61 --  BILITOT -- -- 0.5 --  BILIDIR -- -- -- --  IBILI -- -- -- --  PREALBUMIN -- -- 11.8* --  TRIG -- -- -- 101  CHOLHDL -- -- -- --  CHOL -- -- -- 128   Estimated Creatinine Clearance: 92.9 ml/min (by C-G formula based on Cr of 0.61).    Basename 10/17/11 0008 10/16/11 1607 10/16/11 0959  GLUCAP 141* 127* 142*    Medical History: Past Medical History  Diagnosis Date  . Hypertension     no meds  . Cancer     rectal   . 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  Current nutrition:   Diet: NPO  IVF: NS + 20 mEq/L KCl @ 65 ml/hr  Assessment:   51 yom with h/o rectal cancer, s/p percutaneous transgluteal abscess drain of intra abdominal abscess, diagnosed with rectal leak and underwent laparoscopic diverting loop descending colostomy/biopsies on 8/20.  Patient discharged home 8/25, presented 8/27 with nonfunctional ostomy.  Minimal PO intake since surgery. CT concerning for partial or early small bowel obstruction/ileus to start TNA per surgery 8/28.  Of note, patient refused PICC placement and TNA 8/28 wanting MD reassessment.  Pharmacy received order to start TNA 8/29 - per RN, patient is agreeable with this plan.  Patient with PAC in place.   Labs  Renal/hepatic function: wnl  Electrolytes: wnl, potassium improved after KCL runs yesterday   Pre-Albumin: 11.8 on 8/29  TG/Cholesterol: wnl on 8/29  Plan:  Advance Clinimix E 5/20 to 75 ml/hr at 1800 tonight  TNA to contain IV fat emulsion, standard multivitamins and trace elements only on MWF only due to ongoing shortage  Decrease IVF to 50 ml/hr.  Continue sensitive SSI q6h  TNA labs Monday/Thursdays  Pharmacy will follow up daily  Rawad Bochicchio, Loma Messing PharmD Pager #: [phone] 9:52 AM 10/17/2011

## 2011-10-18 ENCOUNTER — Inpatient Hospital Stay (HOSPITAL_COMMUNITY): Payer: Medicaid Other

## 2011-10-18 LAB — COMPREHENSIVE METABOLIC PANEL
ALT: 10 U/L (ref 0–53)
AST: 13 U/L (ref 0–37)
Albumin: 2.8 g/dL — ABNORMAL LOW (ref 3.5–5.2)
Alkaline Phosphatase: 59 U/L (ref 39–117)
BUN: 17 mg/dL (ref 6–23)
Chloride: 98 mEq/L (ref 96–112)
Potassium: 3.7 mEq/L (ref 3.5–5.1)
Sodium: 136 mEq/L (ref 135–145)
Total Bilirubin: 0.5 mg/dL (ref 0.3–1.2)

## 2011-10-18 LAB — GLUCOSE, CAPILLARY
Glucose-Capillary: 117 mg/dL — ABNORMAL HIGH (ref 70–99)
Glucose-Capillary: 128 mg/dL — ABNORMAL HIGH (ref 70–99)
Glucose-Capillary: 130 mg/dL — ABNORMAL HIGH (ref 70–99)
Glucose-Capillary: 132 mg/dL — ABNORMAL HIGH (ref 70–99)
Glucose-Capillary: 132 mg/dL — ABNORMAL HIGH (ref 70–99)

## 2011-10-18 LAB — CBC
HCT: 31.4 % — ABNORMAL LOW (ref 39.0–52.0)
Hemoglobin: 9.9 g/dL — ABNORMAL LOW (ref 13.0–17.0)
MCH: 29.2 pg (ref 26.0–34.0)
MCHC: 31.5 g/dL (ref 30.0–36.0)
MCV: 92.6 fL (ref 78.0–100.0)

## 2011-10-18 LAB — DIFFERENTIAL
Basophils Relative: 0 % (ref 0–1)
Eosinophils Absolute: 0.1 10*3/uL (ref 0.0–0.7)
Eosinophils Relative: 1 % (ref 0–5)
Monocytes Absolute: 1.3 10*3/uL — ABNORMAL HIGH (ref 0.1–1.0)
Neutro Abs: 8.3 10*3/uL — ABNORMAL HIGH (ref 1.7–7.7)

## 2011-10-18 MED ORDER — ZINC TRACE METAL 1 MG/ML IV SOLN
INTRAVENOUS | Status: DC
  Filled 2011-10-18: qty 2000

## 2011-10-18 MED ORDER — PHENOL 1.4 % MT LIQD
2.0000 | OROMUCOSAL | Status: DC | PRN
  Administered 2011-10-18: 2 via OROMUCOSAL
  Filled 2011-10-18: qty 177

## 2011-10-18 MED ORDER — MENTHOL 3 MG MT LOZG
1.0000 | LOZENGE | OROMUCOSAL | Status: DC | PRN
  Administered 2011-10-18 (×2): 3 mg via ORAL
  Filled 2011-10-18: qty 9

## 2011-10-18 MED ORDER — LIP MEDEX EX OINT
1.0000 "application " | TOPICAL_OINTMENT | Freq: Two times a day (BID) | CUTANEOUS | Status: DC
  Administered 2011-10-18 – 2011-10-20 (×4): 1 via TOPICAL
  Filled 2011-10-18: qty 7

## 2011-10-18 MED ORDER — ALUM & MAG HYDROXIDE-SIMETH 200-200-20 MG/5ML PO SUSP: 30.0000 mL | Freq: Four times a day (QID) | ORAL | Status: DC | PRN

## 2011-10-18 MED ORDER — BISACODYL 10 MG RE SUPP: 10.0000 mg | Freq: Two times a day (BID) | RECTAL | Status: DC | PRN

## 2011-10-18 MED ORDER — FAT EMULSION 20 % IV EMUL
250.0000 mL | INTRAVENOUS | Status: AC
  Administered 2011-10-18: 250 mL via INTRAVENOUS
  Filled 2011-10-18: qty 250

## 2011-10-18 MED ORDER — ZINC TRACE METAL 1 MG/ML IV SOLN
INTRAVENOUS | Status: AC
  Administered 2011-10-18: 17:00:00 via INTRAVENOUS
  Filled 2011-10-18: qty 2000

## 2011-10-18 MED ORDER — MAGIC MOUTHWASH
15.0000 mL | Freq: Four times a day (QID) | ORAL | Status: DC | PRN
  Filled 2011-10-18: qty 15

## 2011-10-18 NOTE — Progress Notes (Signed)
ANTIBIOTIC CONSULT NOTE - Follow Up  Pharmacy Consult for Zosyn Indication: perirectal abscess/fistula No Known Allergies  Patient Measurements: Height: 5\' 5"  (165.1 cm) Weight: 134 lb (60.782 kg) [name] : 61.5  63kg  Vital Signs: Temp: 98.6 F (37 C) (09/02 0656) Temp src: Oral (09/02 0656) BP: 131/87 mmHg (09/02 0656) Pulse Rate: 100  (09/02 0656) Intake/Output from previous day: 09/01 0701 - 09/02 0700 In: 1000 [I.V.:400; TPN:600] Out: 2850 [Urine:600; Emesis/NG output:750; Stool:1500] Intake/Output from this shift:    Labs:  Basename 10/18/11 0405 10/17/11 0855 10/16/11 0710 10/16/11 0410  WBC 10.7* -- -- 11.6*  HGB 9.9* -- -- 10.9*  PLT 350 -- -- 425*  LABCREA -- -- -- --  CREATININE 0.74 0.70 0.61 --   Estimated Creatinine Clearance: 92.9 ml/min (by C-G formula based on Cr of 0.74).   Microbiology: Recent Results (from the past 720 hour(s))  CULTURE, ROUTINE-ABSCESS     Status: Normal   Collection Time   09/18/11  7:45 AM      Component Value Range Status Comment   Specimen Description DRAINAGE CT R TRANSGLUTEAL ABSCESS DRAIN CATHETER   Final    Special Requests NONE   Final    Gram Stain     Final    Value: ABUNDANT WBC PRESENT, PREDOMINANTLY PMN     NO SQUAMOUS EPITHELIAL CELLS SEEN     ABUNDANT GRAM VARIABLE ROD     FEW GRAM POSITIVE COCCI IN PAIRS AND CHAINS   Culture ABUNDANT ESCHERICHIA COLI   Final    Report Status 09/20/2011 FINAL   Final    Organism ID, Bacteria ESCHERICHIA COLI   Final   SURGICAL PCR SCREEN     Status: Normal   Collection Time   09/29/11  8:46 AM      Component Value Range Status Comment   MRSA, PCR NEGATIVE  NEGATIVE Final    Staphylococcus aureus NEGATIVE  NEGATIVE Final     Medications:  Prescriptions prior to admission  Medication Sig Dispense Refill  . amLODipine (NORVASC) 5 MG tablet Take 2 tablets (10 mg total) by mouth daily.  30 tablet  3  . amoxicillin-clavulanate (AUGMENTIN) 875-125 MG per tablet  Take 1 tablet by mouth 2 (two) times daily. pts on day 2 of therapy      . lidocaine-prilocaine (EMLA) cream Apply topically as needed. Apply to [facility] site 1-2 hours prior to stick and cover with plastic wrap  30 g  prn  . oxyCODONE-acetaminophen (PERCOCET/ROXICET) 5-325 MG per tablet Take 1-2 tablets by mouth every 3 (three) hours as needed.  30 tablet  0  . paregoric 2 MG/5ML solution Take 5 mLs by mouth 4 (four) times daily as needed. #12ml --      . PRESCRIPTION MEDICATION Pt's a chemo patient and gets chemo at [facility]. Last treatment was 2 months ago. Pt states his next scheduled chemo is in September 2013. Followed by Dr Truett Perna      . prochlorperazine (COMPAZINE) 10 MG tablet Take 1 tablet (10 mg total) by mouth every 6 (six) hours as needed (nausea).  60 tablet  1   Anti-infectives     Start     Dose/Rate Route Frequency Ordered Stop   10/12/11 1400   piperacillin-tazobactam (ZOSYN) IVPB 3.375 g        3.375 g 12.5 mL/hr over 240 Minutes Intravenous Every 8 hours 10/12/11 1326           Assessment:  51 YOM w/hx rectal Ca  with recent admission 8/20-25 for colostomy.   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   Was d/c 8/25 on augmentin, received IV Flagyl 500mg  q8h 8/20-25)  Scr wnl, WBC remains slightly elevated. Afebrile  No cultures  Goal of Therapy:  Appropriate dose of Zosyn  Plan:   Zosyn 3.375g IV q8h, each dose over 4 hours  Will continue to follow renal function and f/u LOT.   Loralee Pacas, PharmD, BCPS Pager: [phone] 7:33 AM 10/18/2011

## 2011-10-18 NOTE — Progress Notes (Signed)
Patient ID: Jeffery [name], male   DOB: 17-Feb-1960, 51 y.o.   MRN: [id number]  General Surgery - [facility] - Progress Note  POD# 13  Subjective: Patient pleasant, no complaints.  Ambulatory.  States has had several bags of output from colostomy.  Objective: Vital signs in last 24 hours: Temp:  [98.3 F (36.8 C)-98.6 F (37 C)] 98.6 F (37 C) 11/08/2022 0656) Pulse Rate:  [100-110] 100  11-08-22 0656) Resp:  [18] 18  08-Nov-2022 0656) BP: (131-140)/(87-99) 131/87 mmHg 11-08-2022 0656) SpO2:  [94 %-95 %] 95 % 11/08/22 0656) Last BM Date: 10/17/11  Intake/Output from previous day: 09/01 0701 - 2022/11/08 0700 In: 1000 [I.V.:400; TPN:600] Out: 2850 [Urine:600; Emesis/NG output:750; Stool:1500]  Exam: HEENT - clear, not icteric Neck - soft Chest - clear bilaterally Cor - RRR, no murmur Abd - protuberant, slightly softer, non-tender; BS present; pasty stool in ostomy bag Ext - no significant edema Neuro - grossly intact, no focal deficits  Lab Results:   Basename November 08, 2011 0405 10/16/11 0410  WBC 10.7* 11.6*  HGB 9.9* 10.9*  HCT 31.4* 33.9*  PLT 350 425*     Basename 11-08-2011 0405 10/17/11 0855  NA 136 138  K 3.7 3.6  CL 98 96  CO2 31 34*  GLUCOSE 140* 141*  BUN 17 16  CREATININE 0.74 0.70  CALCIUM 8.7 9.1    Studies/Results: Dg Abd 2 Views  2011/11/08  *RADIOLOGY REPORT*  Clinical Data: Small bowel obstruction.  ABDOMEN - 2 VIEW  Comparison: 10/15/2011  Findings: Nasogastric tube has retracted a few centimeters since the prior study with the tip located in the proximal stomach and a side-hole located in the distal esophagus.  Degree of small bowel dilatation is very similar to the prior study.  Multiple air-fluid levels are again seen throughout the small bowel.  No evidence of free intraperitoneal air.  IMPRESSION: Relatively stable small bowel obstruction pattern.  The nasogastric tube has retracted slightly as above.   Original Report Authenticated By: Reola Calkins, M.D.     Assessment / Plan: 1.  Status post diverting colostomy  - continue NG decompression  - IVF  - ambulate  - AXR this AM with persistent obstructive pattern  Velora Heckler, MD, [facility] Office: [phone]  08-Nov-2011

## 2011-10-18 NOTE — Progress Notes (Signed)
PARENTERAL NUTRITION CONSULT NOTE  Pharmacy Consult for TNA Indication: Prolonged ileus  No Known Allergies  Patient Measurements: Height: 5\' 5"  (165.1 cm) Weight: 134 lb (60.782 kg) [name] : 61.5  Adjusted Body Weight: not-calculated since TBW < IBW  Vital Signs: Temp: 98.6 F (37 C) (09/02 0656) Temp src: Oral (09/02 0656) BP: 131/87 mmHg (09/02 0656) Pulse Rate: 100  (09/02 0656) Intake/Output from previous day: 09/01 0701 - 09/02 0700 In: 1000 [I.V.:400; TPN:600] Out: 2850 [Urine:600; Emesis/NG output:750; Stool:1500] Intake/Output from this shift:    Labs:  [facility] 10/18/11 0405 10/16/11 0410  WBC 10.7* 11.6*  HGB 9.9* 10.9*  HCT 31.4* 33.9*  PLT 350 425*  APTT -- --  INR -- --     Basename 10/18/11 0405 10/17/11 0855 10/16/11 0710 10/15/11 0800  NA 136 138 138 --  K 3.7 3.6 3.3* --  CL 98 96 96 --  CO2 31 34* 34* --  GLUCOSE 140* 141* 149* --  BUN 17 16 17  --  CREATININE 0.74 0.70 0.61 --  LABCREA -- -- -- --  CREAT24HRUR -- -- -- --  CALCIUM 8.7 9.1 9.0 --  MG 2.0 -- -- 2.0  PHOS 3.9 -- 3.4 2.0*  PROT 7.3 -- -- --  ALBUMIN 2.8* -- -- --  AST 13 -- -- --  ALT 10 -- -- --  ALKPHOS 59 -- -- --  BILITOT 0.5 -- -- --  BILIDIR -- -- -- --  IBILI -- -- -- --  PREALBUMIN -- -- -- --  TRIG -- -- -- --  CHOLHDL -- -- -- --  CHOL -- -- -- --  Corrected calcium = 9.66 (9/2)  Estimated Creatinine Clearance: 92.9 ml/min (by C-G formula based on Cr of 0.74).   CBGs & Insulin requirements past 24 hours:   CBGs 139, 132, 130, 134  2 units SSI in past 24 hours  Nutritional Goals:  RD Recs 8/28: 9147-8295 Kcal/day, 80-93 grams protein/day, 1 ml per kcal intake  Clinimix E5/20 @75  ml/hr + IVF 20% on MWF will provide 90 grams of protein/day, 2064 Kcal/day on lipid days, 1584 Kcal on non-lipid days, and an average of 1790 KCal per average day of the week.  Current nutrition:   Diet: NPO  IVF: NS + 20 mEq/L KCl @ 50 ml/hr  Assessment:    51 yom with h/o rectal cancer, s/p percutaneous transgluteal abscess drain of intra abdominal abscess, diagnosed with rectal leak and underwent laparoscopic diverting loop descending colostomy/biopsies on 8/20.  Patient discharged home 8/25, presented 8/27 with nonfunctional ostomy.  Minimal PO intake since surgery. CT concerning for partial or early small bowel obstruction/ileus to start TNA per surgery 8/28.  Of note, patient refused PICC placement and TNA 8/28 wanting MD reassessment.  Pharmacy received order to start TNA 8/29 - per RN, patient is agreeable with this plan.  Patient with PAC in place.   Labs  Renal/hepatic function: wnl  Electrolytes: wnl  CBGs: <150  Pre-Albumin: 11.8 on 8/29, pending today  TG/Cholesterol: wnl on 8/29, pending today  Plan:    Continue Clinimix E 5/20 at goal 75 ml/hr   TNA to contain IV fat emulsion, standard multivitamins and trace elements only on MWF only due to ongoing shortage  Continue IVF at 50 ml/hr.  Continue sensitive SSI q6h  TNA labs Monday/Thursdays  Pharmacy will follow up daily  Loralee Pacas, PharmD, BCPS Pager: [phone] 7:19 AM 10/18/2011

## 2011-10-18 NOTE — Progress Notes (Signed)
Patient refusing irrigation of ostomy tonight. Indicates maybe in the morning.

## 2011-10-19 LAB — GLUCOSE, CAPILLARY
Glucose-Capillary: 138 mg/dL — ABNORMAL HIGH (ref 70–99)
Glucose-Capillary: 125 mg/dL — ABNORMAL HIGH (ref 70–99)
Glucose-Capillary: 117 mg/dL — ABNORMAL HIGH (ref 70–99)

## 2011-10-19 MED ORDER — CLINIMIX E/DEXTROSE (5/20) 5 % IV SOLN
INTRAVENOUS | Status: AC
  Administered 2011-10-19: 18:00:00 via INTRAVENOUS
  Filled 2011-10-19: qty 2000

## 2011-10-19 MED ORDER — HEPARIN SOD (PORK) LOCK FLUSH 100 UNIT/ML IV SOLN
500.0000 [IU] | INTRAVENOUS | Status: DC | PRN
  Administered 2011-10-19: 500 [IU]
  Filled 2011-10-19: qty 5

## 2011-10-19 MED ORDER — HEPARIN SOD (PORK) LOCK FLUSH 100 UNIT/ML IV SOLN
500.0000 [IU] | INTRAVENOUS | Status: DC
  Filled 2011-10-19: qty 5

## 2011-10-19 NOTE — Progress Notes (Signed)
Pt states that NG came out. Dr [name] aware and has started pt on a diet.

## 2011-10-19 NOTE — Progress Notes (Signed)
[facility] called to our unit, pt was seen getting food out of the vending machine.  Pt is on a clear liquid diet.  Pt girl friend states that he has been eating even with the ng tube in. Pt states that he might leave Thurs. And that he needs to eat.  Explained to him and his girlfriend that we  Have to follow the Dr's orders, that we can not get him regular food until the Dr orders it.  Message left with Dr Arita Miss office to inform her of this.

## 2011-10-19 NOTE — Progress Notes (Signed)
PARENTERAL NUTRITION CONSULT NOTE  Pharmacy Consult for TNA Indication: Prolonged ileus  No Known Allergies  Patient Measurements: Height: 5\' 5"  (165.1 cm) Weight: 134 lb (60.782 kg) [name] : 61.5  Adjusted Body Weight: not-calculated since TBW < IBW  Vital Signs: Temp: 98.3 F (36.8 C) (09/03 0455) Temp src: Oral (09/03 0455) BP: 153/115 mmHg (09/03 0455) Pulse Rate: 107  (09/03 0455) Intake/Output from previous day: 09/02 0701 - 09/03 0700 In: 1320 [I.V.:200; IV Piggyback:100; TPN:1020] Out: 2000 [Urine:625; Emesis/NG output:1250; Drains:25; Stool:100] Intake/Output from this shift:    Labs:  [facility] 10/18/11 0405  WBC 10.7*  HGB 9.9*  HCT 31.4*  PLT 350  APTT --  INR --     Basename 10/19/11 0540 10/18/11 0405 10/17/11 0855 10/16/11 0710  NA -- 136 138 138  K -- 3.7 3.6 3.3*  CL -- 98 96 96  CO2 -- 31 34* 34*  GLUCOSE -- 140* 141* 149*  BUN -- 17 16 17   CREATININE 0.65 0.74 0.70 --  LABCREA -- -- -- --  CREAT24HRUR -- -- -- --  CALCIUM -- 8.7 9.1 9.0  MG -- 2.0 -- --  PHOS -- 3.9 -- 3.4  PROT -- 7.3 -- --  ALBUMIN -- 2.8* -- --  AST -- 13 -- --  ALT -- 10 -- --  ALKPHOS -- 59 -- --  BILITOT -- 0.5 -- --  BILIDIR -- -- -- --  IBILI -- -- -- --  PREALBUMIN -- 11.3* -- --  TRIG -- 72 -- --  CHOLHDL -- -- -- --  CHOL -- 93 -- --  Corrected calcium = 9.66 (9/2)  Estimated Creatinine Clearance: 92.9 ml/min (by C-G formula based on Cr of 0.65).   CBGs & Insulin requirements past 24 hours:   CBGs 134, 117, 132, 128  2 units SSI in past 24 hours  Nutritional Goals:  RD Recs 8/28: 1610-9604 Kcal/day, 80-93 grams protein/day, 1 ml per kcal intake  Clinimix E5/20 @75  ml/hr + IVF 20% on MWF will provide 90 grams of protein/day, 2064 Kcal/day on lipid days, 1584 Kcal on non-lipid days, and an average of 1790 KCal per average day of the week.  Current nutrition:   Diet: NPO  IVF: NS + 20 mEq/L KCl @ 50 ml/hr  Assessment:   51  yom with h/o rectal cancer, s/p percutaneous transgluteal abscess drain of intra abdominal abscess, diagnosed with rectal leak and underwent laparoscopic diverting loop descending colostomy/biopsies on 8/20.  Patient discharged home 8/25, presented 8/27 with nonfunctional ostomy.  Minimal PO intake since surgery.   CT concerning for partial or early small bowel obstruction/ileus to start TNA per surgery 8/28.  Of note, patient refused PICC placement and TNA 8/28 wanting MD reassessment.  Pharmacy received order to start TNA 8/29 - per RN, patient is agreeable with this plan.  Patient with PAC in place.   Labs  Renal/hepatic function: wnl  Electrolytes: wnl  CBGs: <150  Pre-Albumin: unchanged, 11.8 (8/29), 11.3 (9/2)  TG/Cholesterol: wnl on 8/29, 9/2  Plan:    Continue Clinimix E 5/20 at goal 75 ml/hr   TNA to contain IV fat emulsion, standard multivitamins and trace elements only on MWF only due to ongoing shortage  Continue IVF at 50 ml/hr.  Continue sensitive SSI q6h  TNA labs Monday/Thursdays  Pharmacy will follow up daily  Loralee Pacas, PharmD, BCPS Pager: [phone] 7:01 AM 10/19/2011

## 2011-10-19 NOTE — Progress Notes (Signed)
Patient is not feeling up to irrigation this morning. He states that he is fine. RN will make one more attempt prior to shift end or pass it on to dayshift to try.

## 2011-10-19 NOTE — Progress Notes (Signed)
Patient ID: Jeffery [name], male   DOB: 12-25-1960, 51 y.o.   MRN: [id number]  General Surgery - [facility] - Progress Note  POD# 13  Subjective: Patient pleasant, no complaints.  Ambulatory.  States has had several bags of output from colostomy.  Objective: Vital signs in last 24 hours: Temp:  [98.3 F (36.8 C)-98.8 F (37.1 C)] 98.3 F (36.8 C) (09/03 0455) Pulse Rate:  [107-112] 107  (09/03 0455) Resp:  [19-20] 20  (09/03 0455) BP: (134-153)/(83-115) 153/115 mmHg (09/03 0455) SpO2:  [98 %] 98 % (09/03 0455) Last BM Date: 11-12-11  Intake/Output from previous day: November 12, 2022 0701 - 09/03 0700 In: 1320 [I.V.:200; IV Piggyback:100; TPN:1020] Out: 2000 [Urine:625; Emesis/NG output:1250; Drains:25; Stool:100]  Exam: HEENT - clear, not icteric Neck - soft Chest - clear bilaterally Cor - RRR, no murmur Abd - protuberant, much softer, non-tender; BS present; pasty stool, significant flatus in ostomy bag Ext - no significant edema Neuro - grossly intact, no focal deficits  Lab Results:   Basename 11/12/11 0405  WBC 10.7*  HGB 9.9*  HCT 31.4*  PLT 350     Basename 10/19/11 0540 2011/11/12 0405 10/17/11 0855  NA -- 136 138  K -- 3.7 3.6  CL -- 98 96  CO2 -- 31 34*  GLUCOSE -- 140* 141*  BUN -- 17 16  CREATININE 0.65 0.74 --  CALCIUM -- 8.7 9.1    Studies/Results: Dg Abd 2 Views  11/12/11  *RADIOLOGY REPORT*  Clinical Data: Small bowel obstruction.  ABDOMEN - 2 VIEW  Comparison: 10/15/2011  Findings: Nasogastric tube has retracted a few centimeters since the prior study with the tip located in the proximal stomach and a side-hole located in the distal esophagus.  Degree of small bowel dilatation is very similar to the prior study.  Multiple air-fluid levels are again seen throughout the small bowel.  No evidence of free intraperitoneal air.  IMPRESSION: Relatively stable small bowel obstruction pattern.  The nasogastric tube has retracted slightly as above.    Original Report Authenticated By: Reola Calkins, M.D.     Assessment / Plan: 1.  Status post diverting colostomy  - NGT out  - clear liquids for lunch. Full liquids for breakfast if OK  Irrigations.    10/19/2011

## 2011-10-20 ENCOUNTER — Encounter: Payer: Self-pay | Admitting: Oncology

## 2011-10-20 LAB — GLUCOSE, CAPILLARY
Glucose-Capillary: 132 mg/dL — ABNORMAL HIGH (ref 70–99)
Glucose-Capillary: 154 mg/dL — ABNORMAL HIGH (ref 70–99)
Glucose-Capillary: 115 mg/dL — ABNORMAL HIGH (ref 70–99)
Glucose-Capillary: 117 mg/dL — ABNORMAL HIGH (ref 70–99)
Glucose-Capillary: 91 mg/dL (ref 70–99)
Glucose-Capillary: 112 mg/dL — ABNORMAL HIGH (ref 70–99)

## 2011-10-20 LAB — BASIC METABOLIC PANEL
BUN: 11 mg/dL (ref 6–23)
Calcium: 8.4 mg/dL (ref 8.4–10.5)
Creatinine, Ser: 0.62 mg/dL (ref 0.50–1.35)
GFR calc Af Amer: >90 mL/min (ref >90)
GFR calc non Af Amer: >90 mL/min (ref >90)
Glucose, Bld: 104 mg/dL — ABNORMAL HIGH (ref 70–99)
Potassium: 4.4 mEq/L (ref 3.5–5.1)

## 2011-10-20 MED ORDER — INSULIN ASPART 100 UNIT/ML ~~LOC~~ SOLN: 0.0000 [IU] | Freq: Three times a day (TID) | SUBCUTANEOUS | Status: DC

## 2011-10-20 MED ORDER — ENSURE COMPLETE PO LIQD
237.0000 mL | Freq: Two times a day (BID) | ORAL | Status: DC
Start: 2011-10-20 — End: 2011-10-21

## 2011-10-20 MED ORDER — FAT EMULSION 20 % IV EMUL
250.0000 mL | INTRAVENOUS | Status: DC
  Administered 2011-10-20: 250 mL via INTRAVENOUS
  Filled 2011-10-20: qty 250

## 2011-10-20 MED ORDER — INSULIN ASPART 100 UNIT/ML ~~LOC~~ SOLN: 0.0000 [IU] | Freq: Every day | SUBCUTANEOUS | Status: DC

## 2011-10-20 MED ORDER — ZINC TRACE METAL 1 MG/ML IV SOLN
INTRAVENOUS | Status: DC
  Administered 2011-10-20: 18:00:00 via INTRAVENOUS
  Filled 2011-10-20: qty 1000

## 2011-10-20 MED ORDER — ZINC TRACE METAL 1 MG/ML IV SOLN
INTRAVENOUS | Status: DC
  Filled 2011-10-20: qty 2000

## 2011-10-20 NOTE — Progress Notes (Signed)
Faxed inv from [employer] totaling [phone] to [employer].  Called pt to advise.

## 2011-10-20 NOTE — Progress Notes (Addendum)
PARENTERAL NUTRITION CONSULT NOTE  Pharmacy Consult for TNA Indication: Prolonged ileus  No Known Allergies  Patient Measurements: Height: 5\' 5"  (165.1 cm) Weight: 134 lb (60.782 kg) [name] : 61.5  Adjusted Body Weight: not-calculated since TBW < IBW  Vital Signs: Temp: 98.1 F (36.7 C) (09/04 0622) Temp src: Oral (09/04 0622) BP: 135/84 mmHg (09/04 0622) Pulse Rate: 97  (09/04 0622) Intake/Output from previous day: 09/03 0701 - 09/04 0700 In: 3616.9 [I.V.:1549.2; IV Piggyback:100; TPN:1967.8] Out: 250 [Urine:250] Intake/Output from this shift:    Labs:  [facility] 10/18/11 0405  WBC 10.7*  HGB 9.9*  HCT 31.4*  PLT 350  APTT --  INR --     Basename 10/20/11 0625 10/19/11 0540 10/18/11 0405 10/17/11 0855  NA 137 -- 136 138  K 4.4 -- 3.7 3.6  CL 103 -- 98 96  CO2 27 -- 31 34*  GLUCOSE 104* -- 140* 141*  BUN 11 -- 17 16  CREATININE 0.62 0.65 0.74 --  LABCREA -- -- -- --  CREAT24HRUR -- -- -- --  CALCIUM 8.4 -- 8.7 9.1  MG -- -- 2.0 --  PHOS -- -- 3.9 --  PROT -- -- 7.3 --  ALBUMIN -- -- 2.8* --  AST -- -- 13 --  ALT -- -- 10 --  ALKPHOS -- -- 59 --  BILITOT -- -- 0.5 --  BILIDIR -- -- -- --  IBILI -- -- -- --  PREALBUMIN -- -- 11.3* --  TRIG -- -- 72 --  CHOLHDL -- -- -- --  CHOL -- -- 93 --  Corrected calcium = 9.66 (9/2)  Estimated Creatinine Clearance: 92.9 ml/min (by C-G formula based on Cr of 0.62).   CBGs & Insulin requirements past 24 hours:   CBGs 138, 125, 117, 136  1 units SSI in past 24 hours  Nutritional Goals:  RD Recs 8/28: 0981-1914 Kcal/day, 80-93 grams protein/day, 1 ml per kcal intake  Clinimix E5/20 @75  ml/hr + IVF 20% on MWF will provide 90 grams of protein/day, 2064 Kcal/day on lipid days, 1584 Kcal on non-lipid days, and an average of 1790 KCal per average day of the week.  Current nutrition:   Diet: clear liquis, NG out  IVF: NS + 20 mEq/L KCl @ 50 ml/hr  Assessment:   51 yom with h/o rectal cancer,  s/p percutaneous transgluteal abscess drain of intra abdominal abscess, diagnosed with rectal leak and underwent laparoscopic diverting loop descending colostomy/biopsies on 8/20.  Patient discharged home 8/25, presented 8/27 with nonfunctional ostomy.  Minimal PO intake since surgery.   CT concerning for partial or early small bowel obstruction/ileus to start TNA per surgery 8/28.  Of note, patient refused PICC placement and TNA 8/28 wanting MD reassessment.  Pharmacy received order to start TNA 8/29 - per RN, patient is agreeable with this plan.  Patient with PAC in place.  NG fell out 9/3, diet advanced to clears. RN noted that patient has been eating food from the vending machines. No other po intake documented   Labs  Renal/hepatic function: wnl (9/2)  Electrolytes: wnl  CBGs: <150  Pre-Albumin: unchanged, 11.8 (8/29), 11.3 (9/2)  TG/Cholesterol: wnl on 8/29, 9/2  Plan:    Spoke with Dr. [name] - decrease Clinimix E 5/20 to 40 ml/hr with goal of stopping tomorrow  TNA to contain IV fat emulsion, standard multivitamins and trace elements only on MWF only due to ongoing shortage  Continue IVF at 50 ml/hr.  Change sensitive SSI to  achs  TNA labs Monday/Thursdays  Follow up plans to discharge on 9/5  Loralee Pacas, PharmD, BCPS Pager: [phone] 7:58 AM 10/20/2011

## 2011-10-20 NOTE — Progress Notes (Signed)
Patient ID: Jeffery [name], male   DOB: 01-12-1961, 51 y.o.   MRN: [id number]  General Surgery - [facility] - Progress Note   Subjective: No complaints.  Tolerating full liquids.    Objective: Vital signs in last 24 hours: Temp:  [98.1 F (36.7 C)-98.5 F (36.9 C)] 98.1 F (36.7 C) (09/04 0622) Pulse Rate:  [97-98] 97  (09/04 0622) Resp:  [20] 20  (09/04 0622) BP: (131-136)/(84-89) 135/84 mmHg (09/04 0622) SpO2:  [96 %-99 %] 99 % (09/04 0622) Last BM Date: 10/19/11  Intake/Output from previous day: 09/03 0701 - 09/04 0700 In: 3616.9 [I.V.:1549.2; IV Piggyback:100; TPN:1967.8] Out: 250 [Urine:250]  Exam: HEENT - clear, not icteric Neck - soft Chest - clear bilaterally Cor - RRR, no murmur Abd - protuberant, much softer, non-tender; BS present; pasty stool, significant flatus in ostomy bag Ext - no significant edema Neuro - grossly intact, no focal deficits  Lab Results:   Basename 10/18/11 0405  WBC 10.7*  HGB 9.9*  HCT 31.4*  PLT 350     Basename 10/20/11 0625 10/19/11 0540 10/18/11 0405  NA 137 -- 136  K 4.4 -- 3.7  CL 103 -- 98  CO2 27 -- 31  GLUCOSE 104* -- 140*  BUN 11 -- 17  CREATININE 0.62 0.65 --  CALCIUM 8.4 -- 8.7    Studies/Results: No results found.  Assessment / Plan: 1.  Status post diverting colostomy  Advance to regular diet.  Wean TNA  Hope to d/c tomorrow.     10/20/2011

## 2011-10-20 NOTE — Progress Notes (Signed)
Nutrition Follow-up  Intervention: TNA per pharmacy. Strawberry Ensure Complete BID. Will monitor.   Diet Order: Regular  TNA: Clinimix E 5/20 @ 75 ml/hr.  Lipids (20% IVFE @ 10 ml/hr), multivitamins, and trace elements are provided 3 times weekly (MWF) due to national backorder.  Provides 1790 kcal and 90 grams protein daily (based on weekly average).  Meets 103% minimum estimated kcal and 112% minimum estimated protein needs.  Additional IVF with NS @ 50 ml/hr.  - NGT out yesterday. Pt reports great appetite and excellent intake. Pt interested in getting strawberry Ensure, will order. Noted plans to wean TNA.   Meds: Scheduled Meds:   . cloNIDine  0.3 mg Transdermal Weekly  . enoxaparin  40 mg Subcutaneous Q24H  . heparin lock flush  500 Units Intracatheter Q30 days  . insulin aspart  0-5 Units Subcutaneous QHS  . insulin aspart  0-9 Units Subcutaneous TID WC  . lip balm  1 application Topical BID  . piperacillin-tazobactam (ZOSYN)  IV  3.375 g Intravenous Q8H  . DISCONTD: insulin aspart  0-9 Units Subcutaneous Q6H   Continuous Infusions:   . 0.9 % NaCl with KCl 20 mEq / L 50 mL/hr at 10/20/11 0604  . fat emulsion 250 mL (10/18/11 1723)  . fat emulsion    . TPN (CLINIMIX) +/- additives 75 mL/hr at 10/18/11 1723  . TPN (CLINIMIX) +/- additives 75 mL/hr at 10/19/11 1805  . TPN (CLINIMIX) +/- additives    . DISCONTD: TPN (CLINIMIX) +/- additives     PRN Meds:.acetaminophen, acetaminophen, alum & mag hydroxide-simeth, bisacodyl, heparin lock flush, hydrALAZINE, HYDROmorphone (DILAUDID) injection, lidocaine-prilocaine, magic mouthwash, menthol-cetylpyridinium, nicotine, ondansetron, phenol, sodium chloride  Labs:  CMP     Component Value Date/Time   NA 137 10/20/2011 0625   K 4.4 10/20/2011 0625   CL 103 10/20/2011 0625   CO2 27 10/20/2011 0625   GLUCOSE 104* 10/20/2011 0625   BUN 11 10/20/2011 0625   CREATININE 0.62 10/20/2011 0625   CALCIUM 8.4 10/20/2011 0625   PROT 7.3 10/18/2011 0405   ALBUMIN 2.8* 10/18/2011 0405   AST 13 10/18/2011 0405   ALT 10 10/18/2011 0405   ALKPHOS 59 10/18/2011 0405   BILITOT 0.5 10/18/2011 0405   GFRNONAA >90 10/20/2011 0625   GFRAA >90 10/20/2011 0625   CBG (last 3)   Basename 10/20/11 1211 10/20/11 0606 10/20/11 0004  GLUCAP 115* 117* 118*   - PALB stable - CBGs controlled   Intake/Output Summary (Last 24 hours) at 10/20/11 1538 Last data filed at 10/20/11 1500  Gross per 24 hour  Intake 3828.5 ml  Output    400 ml  Net 3428.5 ml   Colostomy output - last stool 9/3  Weight Status: No new weights   Estimated needs:   1730-1854 calories 80-93g protein  Nutrition Dx: Inadequate oral intake - resolved   Goal:  1. Diet advancement - met 2. TNA to meet >90% of estimated energy needs - met 3. Minimize weight loss - unsure as there are no weights since admission   New goal: Pt to consume >90% of meals/supplements.   Monitor:  TNA, weights, labs, intake, BM  Levon Hedger MS, RD, LDN [phone] Pager [phone] After Hours Pager

## 2011-10-21 ENCOUNTER — Other Ambulatory Visit: Payer: Self-pay | Admitting: *Deleted

## 2011-10-21 ENCOUNTER — Telehealth: Payer: Self-pay | Admitting: Oncology

## 2011-10-21 LAB — COMPREHENSIVE METABOLIC PANEL
ALT: 27 U/L (ref 0–53)
Albumin: 2.6 g/dL — ABNORMAL LOW (ref 3.5–5.2)
Alkaline Phosphatase: 79 U/L (ref 39–117)
Calcium: 8.6 mg/dL (ref 8.4–10.5)
GFR calc Af Amer: >90 mL/min (ref >90)
Glucose, Bld: 110 mg/dL — ABNORMAL HIGH (ref 70–99)
Potassium: 4.2 mEq/L (ref 3.5–5.1)
Sodium: 134 mEq/L — ABNORMAL LOW (ref 135–145)
Total Protein: 7.1 g/dL (ref 6.0–8.3)

## 2011-10-21 MED ORDER — AMLODIPINE BESYLATE 5 MG PO TABS: 10.0000 mg | ORAL_TABLET | Freq: Every day | ORAL | Status: DC

## 2011-10-21 MED ORDER — OXYCODONE-ACETAMINOPHEN 5-325 MG PO TABS: 1.0000 | ORAL_TABLET | ORAL | Status: DC | PRN

## 2011-10-21 MED ORDER — OXYCODONE-ACETAMINOPHEN 10-325 MG PO TABS: 1.0000 | ORAL_TABLET | ORAL | Status: DC | PRN

## 2011-10-21 NOTE — Telephone Encounter (Signed)
S/w the  pt and he is aware that his appt has been r/s from 10/22/2011 to two weeks out.

## 2011-10-21 NOTE — Progress Notes (Signed)
Patient discharged home in stable condition.  HH services to be provided.  CM spoke with patient before discharge. Discharge instructions and teaching given to patient with verbal feedback and understanding.

## 2011-10-21 NOTE — Discharge Summary (Signed)
Physician Discharge Summary  Patient ID: Jeffery [name] MRN: [id number] DOB/AGE: 52/04/1960 52 y.o.  Admit date: 10/12/2011 Discharge date: 10/21/2011  Admission Diagnoses: Obstipation Bowel obstruction Stage IV rectal cancer.  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  Xrays/CT were concerning for obstruction.  Pt had NGT placed with significant bilious output.  His CT also showed significant stool burden in right colon.  He underwent irrigations via loop colostomy and was able to have significant stool output.  He developed flatus as well.  He was able to tolerate NGT removal and diet advance.  He was discharged to home in stable improved condition.    Consults: None  Significant Diagnostic Studies: radiology: KUB: see above and CT scan: see above  Treatments: IV hydration and WOCN consult, irrigations.  Discharge Exam: Blood pressure 135/92, pulse 94, temperature 98 F (36.7 C), temperature source Oral, resp. rate 18, height 5\' 5"  (1.651 m), weight 134 lb (60.782 kg), SpO2 94.00%. General appearance: alert, cooperative and no distress Head: Normocephalic, without obvious abnormality, atraumatic Chest wall: no tenderness GI: soft, non distended.  Ostomy appliance with copious flatus and stool.  \ Extremities: extremities normal, atraumatic, no cyanosis or edema  Disposition: 01-Home or Self Care  Discharge Orders    Future Appointments: Provider: Department: Dept Phone: Center:   10/22/2011 11:30 AM Ladene Artist, MD [facility] [phone] None   11/01/2011 11:15 AM Almond Lint, MD [facility] [phone] None       Future Orders Please Complete By Expires   Ambulatory referral to Home Health      Comments:   Please evaluate Jeffery [name] for admission to [facility].  Disciplines requested: Nursing  Services to provide: [facility] Care  Physician to follow patient's care (the person listed here will be responsible for signing ongoing orders): Referring Provider  Requested Start of Care Date: Tomorrow  Special Instructions:  New loop colostomy care   Diet - low sodium heart healthy      Increase activity slowly      Discharge wound care:      Comments:   Ostomy care     Medication List  As of 10/21/2011  9:29 AM   STOP taking these medications         amoxicillin-clavulanate 875-125 MG per tablet      oxyCODONE-acetaminophen 5-325 MG per tablet         TAKE these medications         amLODipine 5 MG tablet   Commonly known as: NORVASC   Take 2 tablets (10 mg total) by mouth daily.      lidocaine-prilocaine cream   Commonly known as: EMLA   Apply topically as needed. Apply to [facility] site 1-2 hours prior to stick and cover with plastic wrap      oxyCODONE-acetaminophen 10-325 MG per tablet   Commonly known as: PERCOCET   Take 1-2 tablets by mouth every 4 (four) hours as needed for pain.      paregoric 2 MG/5ML solution   Take 5 mLs by mouth 4 (four) times daily as needed. #162ml --      PRESCRIPTION MEDICATION   Pt's a chemo patient and gets [facility] at  [facility]. Last treatment was 2 months ago. Pt states his next scheduled chemo is in September 2013. Followed by Dr Truett Perna      prochlorperazine 10 MG tablet   Commonly known as: COMPAZINE   Take 1 tablet (10 mg total) by mouth every 6 (six) hours as needed (nausea).           Follow-up Information    Follow up with [place], MD. Schedule an appointment as soon as possible for a visit in 3 weeks.   Contact information:   [address] [place] [address] [phone]           Signed: Almond Lint 10/21/2011, 9:29 AM

## 2011-10-22 ENCOUNTER — Ambulatory Visit: Payer: Medicaid Other | Admitting: Oncology

## 2011-10-22 LAB — GLUCOSE, CAPILLARY: Glucose-Capillary: 97 mg/dL (ref 70–99)

## 2011-10-25 ENCOUNTER — Encounter: Payer: Self-pay | Admitting: Nutrition

## 2011-10-25 NOTE — Progress Notes (Unsigned)
Patient called requesting one case of Ensure Plus.  I will provide this to him to pick up on Tuesday, Sept 10, 2013.  Patient was appreciative.

## 2011-10-29 ENCOUNTER — Ambulatory Visit (INDEPENDENT_AMBULATORY_CARE_PROVIDER_SITE_OTHER): Payer: Medicaid Other | Admitting: General Surgery

## 2011-11-01 ENCOUNTER — Ambulatory Visit (INDEPENDENT_AMBULATORY_CARE_PROVIDER_SITE_OTHER): Payer: Medicaid Other | Admitting: General Surgery

## 2011-11-01 ENCOUNTER — Encounter (INDEPENDENT_AMBULATORY_CARE_PROVIDER_SITE_OTHER): Payer: Self-pay | Admitting: General Surgery

## 2011-11-01 ENCOUNTER — Telehealth: Payer: Self-pay | Admitting: Nutrition

## 2011-11-01 VITALS — BP 110/80 | HR 102 | Temp 98.0°F | Resp 18 | Ht 65.0 in | Wt 126.6 lb

## 2011-11-01 DIAGNOSIS — C20 Malignant neoplasm of rectum: Secondary | ICD-10-CM

## 2011-11-01 MED ORDER — OXYCODONE-ACETAMINOPHEN 10-325 MG PO TABS: 1.0000 | ORAL_TABLET | ORAL | Status: DC | PRN

## 2011-11-01 NOTE — Patient Instructions (Signed)
Take stool softeners and laxatives as needed to keep stoma output soft.  Measure drain output the last week before you come see me.  DO NOT BRING SPECIMEN.

## 2011-11-01 NOTE — Assessment & Plan Note (Signed)
Did not find recurrent cancer in biopsies.   Chemo for Stage IV disease in lungs,  Follow up in 4 weeks.   Measure drain output at that time.

## 2011-11-01 NOTE — Progress Notes (Signed)
HISTORY: Pt is doing OK post lap diverting colostomy for chronic rectal perforation.  He was readmitted for bowel obstruction/obstipation post op.  He required colostomy irrigations and got better function.  He has been eating, but still has decreased appetite.   EXAM: General:  Alert and oriented Incision:  C/d/i.  Stoma pink. Stool, gas in bag.     PATHOLOGY: 1. Rectum, biopsy, Anterior Wall - BENIGN RECTAL MUCOSA. NO ADENOMATOUS CHANGE OR MALIGNANCY. 2. Rectum, biopsy, Posterior Wall - BENIGN RECTAL MUCOSA. NO ADENOMATOUS CHANGE OR MALIGNANCY. 3. Rectum, biopsy, Right Wall - BENIGN RECTAL MUCOSA. NO ADENOMATOUS CHANGE OR MALIGNANCY. 4. Rectum, biopsy, Left Wall - BENIGN RECTAL MUCOSA. NO ADENOMATOUS CHANGE OR MALIGNANCY.   ASSESSMENT AND PLAN:   ADENOCARCINOMA, RECTUM, ypT3ypN0M1 Did not find recurrent cancer in biopsies.   Chemo for Stage IV disease in lungs,  Follow up in 4 weeks.   Measure drain output at that time.        Maudry Diego, MD Surgical Oncology, General & Endocrine Surgery [facility]  No primary provider on file. Louis Meckel, MD

## 2011-11-01 NOTE — Telephone Encounter (Signed)
Patient requesting 2nd case of Ensure Plus to be picked up on Sept 20, 2013.  Patient understands he can receive 3 total and therefore, is eligible for one more case.

## 2011-11-05 ENCOUNTER — Telehealth: Payer: Self-pay | Admitting: Oncology

## 2011-11-05 ENCOUNTER — Ambulatory Visit (INDEPENDENT_AMBULATORY_CARE_PROVIDER_SITE_OTHER): Payer: Medicaid Other | Admitting: General Surgery

## 2011-11-05 ENCOUNTER — Ambulatory Visit (HOSPITAL_BASED_OUTPATIENT_CLINIC_OR_DEPARTMENT_OTHER): Payer: Medicaid Other | Admitting: Nurse Practitioner

## 2011-11-05 VITALS — BP 140/94 | HR 88 | Temp 97.4°F | Resp 16 | Ht 64.0 in | Wt 129.0 lb

## 2011-11-05 VITALS — BP 138/98 | HR 103 | Temp 97.7°F | Resp 20 | Ht 65.0 in | Wt 129.0 lb

## 2011-11-05 DIAGNOSIS — C7800 Secondary malignant neoplasm of unspecified lung: Secondary | ICD-10-CM

## 2011-11-05 DIAGNOSIS — C20 Malignant neoplasm of rectum: Secondary | ICD-10-CM

## 2011-11-05 DIAGNOSIS — L0291 Cutaneous abscess, unspecified: Secondary | ICD-10-CM

## 2011-11-05 DIAGNOSIS — N529 Male erectile dysfunction, unspecified: Secondary | ICD-10-CM

## 2011-11-05 DIAGNOSIS — L0390 Cellulitis, unspecified: Secondary | ICD-10-CM

## 2011-11-05 NOTE — Telephone Encounter (Signed)
Gave pt appt for September Ct then MD visit , NPO 4 hours prior to CT

## 2011-11-05 NOTE — Patient Instructions (Signed)
Your drain may be out of place.  We will evaluate this with your CT scan on Thursday.

## 2011-11-05 NOTE — Progress Notes (Signed)
Jeffery [name] is a 51 y.o. male who is here for a follow up visit regarding his abdominal drain.  He has noticed a foul smelling discharge around the drain for the past week or so.  He denies fevers or chills.  THe drain flushes well.  Objective: Filed Vitals:   11/05/11 1622  BP: 140/94  Pulse: 88  Temp: 97.4 F (36.3 C)  Resp: 16    General appearance: no distress Drain site with excoriation and purulent drainage.  Drain attached and in place  Assessment and Plan: NO acute distress or signs of pelvic sepsis.  His drain may need to be repositioned.  He is scheduled for a CT scan on Thur.  We will use this to eval drain placement. Flush drain TID    .Vanita Panda, MD [facility], [place] [phone]

## 2011-11-05 NOTE — Progress Notes (Signed)
OFFICE PROGRESS NOTE  Interval history:  Jeffery [name] returns as scheduled. He underwent a diverting loop descending colostomy on 10/05/2011. Multiple rectal biopsies were obtained. Pathology showed no malignancy.   He was readmitted on 10/12/2011 with a bowel obstruction. Nasogastric tube was placed. CT showed a small bowel obstruction with transition point at a twisted loop of bowel in the central pelvis. The loop colostomy was irrigated. He began having output via the colostomy and the NG tube was removed. He was discharged home on 10/21/2011.  Jeffery [name] reports that overall he is feeling well. He reports the colostomy is functioning normally. He continues to have the drainage catheter. He notes a foul odor associated with the catheter. No fever. He denies nausea/vomiting. He has a good appetite.   Objective: Blood pressure 138/98, pulse 103, temperature 97.7 F (36.5 C), temperature source Oral, resp. rate 20, height 5\' 5"  (1.651 m), weight 129 lb (58.514 kg).  Oropharynx is without thrush or ulceration. Lungs are clear. Regular cardiac rhythm. Port-A-Cath site is without erythema. Abdomen is soft and nontender. No hepatomegaly. Left abdomen  ostomy. Drainage catheter at the right buttock. Brown drainage in the catheter tubing. Extremities are without edema.  Lab Results: Lab Results  Component Value Date   WBC 10.7* 10/18/2011   HGB 9.9* 10/18/2011   HCT 31.4* 10/18/2011   MCV 92.6 10/18/2011   PLT 350 10/18/2011    Chemistry:    Chemistry      Component Value Date/Time   NA 134* 10/21/2011 0424   K 4.2 10/21/2011 0424   CL 102 10/21/2011 0424   CO2 24 10/21/2011 0424   BUN 12 10/21/2011 0424   CREATININE 0.73 10/21/2011 0424      Component Value Date/Time   CALCIUM 8.6 10/21/2011 0424   ALKPHOS 79 10/21/2011 0424   AST 25 10/21/2011 0424   ALT 27 10/21/2011 0424   BILITOT 0.3 10/21/2011 0424       Studies/Results: Ct Angio Chest Pe W/cm &/or Wo Cm  10/07/2011  *RADIOLOGY REPORT*  Clinical Data:  Postop from colostomy for metastatic rectal carcinoma.  Hypoxia.  Short of breath.  CT ANGIOGRAPHY CHEST  Technique:  Multidetector CT imaging of the chest using the standard protocol during bolus administration of intravenous contrast. Multiplanar reconstructed images including MIPs were obtained and reviewed to evaluate the vascular anatomy.  Contrast: OMNIPAQUE IOHEXOL 350 MG/ML SOLN  Comparison: 07/22/2011  Findings: Satisfactory opacification of the pulmonary arteries noted, and there is no evidence of pulmonary emboli.  No evidence of thoracic aortic aneurysm or dissection.  No evidence of mediastinal hematoma or mass.  No lymphadenopathy seen within the thorax.  Numerous small bilateral pulmonary metastases have increased in both size and number since previous study.  An index pulmonary nodule in the right middle lobe now measures 19 mm compared to 16 mm on previous study.  Increased dependent atelectasis is noted mainly in the lower lobes.  There is no evidence of pleural or pericardial effusion.  A small hiatal hernia is noted. No suspicious bone lesions identified.  Dilated bowel loops seen in the upper abdomen may reflect a postop ileus.  IMPRESSION:  1.  No evidence of pulmonary embolism. 2.  Mild progression of diffuse bilateral pulmonary metastases. 3.  Increased bilateral lower lobe dependent atelectasis. 4.  Small hiatal hernia. 5. Dilated small bowel loops seen in the upper abdomen are suspicious for a postop ileus.  Consider abdominal radiographs for better assessment of the bowel gas pattern.  Original Report Authenticated By: Danae Orleans, M.D.    Ct Abdomen Pelvis W Contrast  10/12/2011  *RADIOLOGY REPORT*  Clinical Data: Rectal cancer.  Small bowel obstruction.  CT ABDOMEN AND PELVIS WITH CONTRAST  Technique:  Multidetector CT imaging of the abdomen and pelvis was performed following the standard protocol during bolus administration of intravenous contrast.  Contrast: 80mL OMNIPAQUE  IOHEXOL 300 MG/ML  SOLN  Comparison: 09/16/2011  Findings: Scattered pulmonary nodules are present in the lung bases.  Index nodule 10 mm in diameter on image 14 of series 7 in the left lower lobe (formerly 9 mm).  Small hiatal hernia noted with distal esophageal wall thickening - esophagitis is not excluded.  The catheter terminates in the right atrium.  No definite liver metastatic disease is observed.  Slightly nodular left adrenal gland without overt mass.  Spleen unremarkable.  Right mid kidney cyst noted anteriorly.  Markedly dilated loops of small bowel noted.  This extends down to a twisted loop of small bowel in the central pelvis, with twisting apparent on images 50-68 of series 4.  No definite extraluminal gas.  Presacral / perirectal abscess pigtail drainage catheter noted.  Mild ascites is most notable on the left pericolic gutter.  Bridging spurring of the sacroiliac joints noted.  IMPRESSION:  1.  Small bowel obstruction, with transition point at a twisted loop of bowel in the central pelvis. 2.  Perirectal drainage catheter. 3.  Essentially stable bibasilar metastatic nodules. 4.  Small amount of ascites. 5.  Small hiatal hernia and distal esophageal wall thickening, query esophagitis.   Original Report Authenticated By: Dellia Cloud, M.D.    Dg Abd 2 Views  10/18/2011  *RADIOLOGY REPORT*  Clinical Data: Small bowel obstruction.  ABDOMEN - 2 VIEW  Comparison: 10/15/2011  Findings: Nasogastric tube has retracted a few centimeters since the prior study with the tip located in the proximal stomach and a side-hole located in the distal esophagus.  Degree of small bowel dilatation is very similar to the prior study.  Multiple air-fluid levels are again seen throughout the small bowel.  No evidence of free intraperitoneal air.  IMPRESSION: Relatively stable small bowel obstruction pattern.  The nasogastric tube has retracted slightly as above.   Original Report Authenticated By: Reola Calkins, M.D.    Dg Abd 2 Views  10/15/2011  *RADIOLOGY REPORT*  Clinical Data: Small bowel obstruction. A rectal carcinoma  ABDOMEN - 2 VIEW  Comparison: 08/14/2011  Findings: Nasogastric tube tip remains in the proximal stomach. Mild decrease in degree of small bowel dilatation is seen.  There are multiple persistent air fluid levels.  There is a paucity of colonic gas.  No free air identified.  Percutaneous drainage catheter again seen within the inferior pelvis.  IMPRESSION: Persistent small bowel obstruction, with mild decrease in degree of small bowel dilatation.   Original Report Authenticated By: Danae Orleans, M.D.    Dg Abd 2 Views  10/14/2011  *RADIOLOGY REPORT*  Clinical Data: Abdominal pain.  Small bowel obstruction.  ABDOMEN - 2 VIEW  Comparison: CT scan and radiographs dated 10/12/2011  Findings: The NG tube loops into the stomach and then back up into the distal esophagus and needs to be repositioned.  There is persistent dilatation of multiple small bowel loops consistent with small bowel obstruction.  No free air.  The perirectal drainage catheter is again noted.  IMPRESSION: Persistent small bowel obstruction.  NG tube tip is in the distal esophagus and needs  to be repositioned.  Critical Value/emergent results were called by telephone at the time of interpretation on 10/14/2011 at 10:15 a.m. to the patient's nurse, [name], who verbally acknowledged these results.   Original Report Authenticated By: Gwynn Burly, M.D.    Dg Abd 2 Views  10/12/2011  *RADIOLOGY REPORT*  Clinical Data: Status post recent colostomy.  Abdominal pain and vomiting.  Evaluate for potential obstruction.  ABDOMEN - 2 VIEW  Comparison: CT of the abdomen and pelvis 09/16/2011.  Findings: Multiple dilated loops of small bowel are noted, measuring up to approximately 4.8 cm in diameter.  On the upright projection, there are multiple air fluid levels.  No definite pneumoperitoneum is identified.  There is a large  amount well formed stool in the cecum.  A paucity of stool is otherwise noted in the colon, and there appears to be left lower quadrant colostomy.  A single surgical drain is in place with the pigtail reformed in the low anatomic pelvis.  Paucity of bowel gas in the lower abdomen and pelvis.  IMPRESSION: 1.  Nonspecific bowel gas pattern, concerning for partial or early small bowel obstruction, as above. 2.  No pneumoperitoneum. 3.  Postoperative changes and support apparatus, as above.   Original Report Authenticated By: Florencia Reasons, M.D.     Medications: I have reviewed the patient's current medications.  Assessment/Plan:  1. Clinical stage III (uT3 uN1) rectal cancer status post pelvic radiation, August 24 through November 18, 2009. 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 There was no lymph node involvement (pT3 pN0). He began adjuvant CAPOX chemotherapy March 11, 2010. He completed the sixth and final cycle of CAPOX 06/25/2010. CT of the abdomen and pelvis 03/11/2011 confirmed multiple nodules at the lung bases consistent with metastatic disease. 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 He completed cycle 1 of FOLFIRI/Avastin 04/29/2011. He completed cycle 3 05/27/2011. CEA was improved on 06/10/2011. He completed cycle 4 on 06/10/2011. He completed cycle 5 on 06/24/2011. He completed cycle 6 on 07/08/2011. Restaging CT evaluation 07/22/2011 showed a slight decrease in pulmonary nodules. He completed cycle 8 FOLFIRI/Avastin on 08/12/2011. He completed cycle 9 on 09/02/2011. 2. Strangulated peristomal hernia and small bowel obstruction status post reduction of peristomal  hernia, omentectomy, exploratory laparotomy, small bowel resection x2 on January 28, 2010.  3. History of rectal bleeding secondary to number 1.  4. History of an elevated CEA. Improved on 06/10/2011 and 07/29/2011. 5. History of a left adrenal nodule.  6. Weight loss, improved.  7. Right knee pain/effusion status post evaluation at [facility].  8. CT abdomen/pelvis 03/11/2011 with multiple nodules at the lung bases consistent with metastatic involvement of the lungs; prominent presacral soft tissue; air bubble within the presacral soft tissue appeared to be extraluminal and breakdown at the anastomotic site could not be excluded. No discrete abscess was seen. 9. Elevated blood pressure. Question effect of Avastin. He was started on Norvasc on 06/10/2011.  10. Frequent bowel movements. Likely related to multiple bowel resections. Imodium was not effective. 11. Suture at the ileostomy reversal scar. Suture removed during the recent hospitalization. 12. Erectile dysfunction. The testosterone level returned at 508 on 08/12/2011, he will be referred to urology 13. 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 Culture returned positive for abundant Escherichia coli. He completed a course of Augmentin. There is continued output from the drain. He has noticed a foul odor. Dr. Truett Perna recommends we notify Dr. [name]. 14. Status post diverting loop descending colostomy 10/05/2011. 15. Status post multiple rectal biopsies 10/05/2011, all negative for malignancy. 16. Hospitalization 10/13/2011 with a bowel obstruction. Resolved.  Disposition-Jeffery [name] appears stable. Dr. Truett Perna recommends a restaging noncontrast CT scan of the chest. Jeffery [name] will return for a followup visit in 2 weeks to review the results and discuss resuming chemotherapy. We will contact Dr. Arita Miss office regarding the foul smelling drainage from the pelvic drain.  Plan reviewed with Dr.  Truett Perna.  Lonna Cobb ANP/GNP-BC

## 2011-11-07 ENCOUNTER — Inpatient Hospital Stay (HOSPITAL_COMMUNITY)
Admission: EM | Admit: 2011-11-07 | Discharge: 2011-12-02 | DRG: 329 | Disposition: A | Discharge status: Home Health | Payer: Medicaid Other | Attending: General Surgery | Admitting: General Surgery

## 2011-11-07 ENCOUNTER — Emergency Department (HOSPITAL_COMMUNITY): Payer: Medicaid Other

## 2011-11-07 ENCOUNTER — Encounter (HOSPITAL_COMMUNITY): Payer: Self-pay | Admitting: Emergency Medicine

## 2011-11-07 ENCOUNTER — Other Ambulatory Visit: Payer: Self-pay

## 2011-11-07 DIAGNOSIS — E870 Hyperosmolality and hypernatremia: Secondary | ICD-10-CM | POA: Diagnosis not present

## 2011-11-07 DIAGNOSIS — R6521 Severe sepsis with septic shock: Secondary | ICD-10-CM | POA: Diagnosis not present

## 2011-11-07 DIAGNOSIS — E876 Hypokalemia: Secondary | ICD-10-CM | POA: Diagnosis not present

## 2011-11-07 DIAGNOSIS — K651 Peritoneal abscess: Secondary | ICD-10-CM | POA: Diagnosis not present

## 2011-11-07 DIAGNOSIS — F17200 Nicotine dependence, unspecified, uncomplicated: Secondary | ICD-10-CM | POA: Diagnosis present

## 2011-11-07 DIAGNOSIS — L0231 Cutaneous abscess of buttock: Secondary | ICD-10-CM | POA: Diagnosis not present

## 2011-11-07 DIAGNOSIS — B961 Klebsiella pneumoniae [K. pneumoniae] as the cause of diseases classified elsewhere: Secondary | ICD-10-CM | POA: Diagnosis present

## 2011-11-07 DIAGNOSIS — Z933 Colostomy status: Secondary | ICD-10-CM

## 2011-11-07 DIAGNOSIS — N390 Urinary tract infection, site not specified: Secondary | ICD-10-CM

## 2011-11-07 DIAGNOSIS — D649 Anemia, unspecified: Secondary | ICD-10-CM | POA: Diagnosis not present

## 2011-11-07 DIAGNOSIS — J95821 Acute postprocedural respiratory failure: Secondary | ICD-10-CM | POA: Diagnosis not present

## 2011-11-07 DIAGNOSIS — E46 Unspecified protein-calorie malnutrition: Secondary | ICD-10-CM | POA: Diagnosis present

## 2011-11-07 DIAGNOSIS — C20 Malignant neoplasm of rectum: Secondary | ICD-10-CM | POA: Diagnosis present

## 2011-11-07 DIAGNOSIS — K56609 Unspecified intestinal obstruction, unspecified as to partial versus complete obstruction: Secondary | ICD-10-CM | POA: Diagnosis present

## 2011-11-07 DIAGNOSIS — Z66 Do not resuscitate: Secondary | ICD-10-CM | POA: Diagnosis not present

## 2011-11-07 DIAGNOSIS — R6520 Severe sepsis without septic shock: Secondary | ICD-10-CM | POA: Diagnosis not present

## 2011-11-07 DIAGNOSIS — Z681 Body mass index (BMI) 19 or less, adult: Secondary | ICD-10-CM

## 2011-11-07 DIAGNOSIS — C7800 Secondary malignant neoplasm of unspecified lung: Secondary | ICD-10-CM | POA: Diagnosis present

## 2011-11-07 DIAGNOSIS — I959 Hypotension, unspecified: Secondary | ICD-10-CM | POA: Diagnosis not present

## 2011-11-07 DIAGNOSIS — K219 Gastro-esophageal reflux disease without esophagitis: Secondary | ICD-10-CM | POA: Diagnosis present

## 2011-11-07 DIAGNOSIS — N289 Disorder of kidney and ureter, unspecified: Secondary | ICD-10-CM | POA: Diagnosis not present

## 2011-11-07 DIAGNOSIS — I10 Essential (primary) hypertension: Secondary | ICD-10-CM

## 2011-11-07 DIAGNOSIS — J690 Pneumonitis due to inhalation of food and vomit: Secondary | ICD-10-CM | POA: Diagnosis not present

## 2011-11-07 DIAGNOSIS — Z720 Tobacco use: Secondary | ICD-10-CM

## 2011-11-07 DIAGNOSIS — K659 Peritonitis, unspecified: Secondary | ICD-10-CM

## 2011-11-07 DIAGNOSIS — A419 Sepsis, unspecified organism: Secondary | ICD-10-CM | POA: Diagnosis not present

## 2011-11-07 DIAGNOSIS — E872 Acidosis, unspecified: Secondary | ICD-10-CM | POA: Diagnosis not present

## 2011-11-07 DIAGNOSIS — K650 Generalized (acute) peritonitis: Secondary | ICD-10-CM | POA: Diagnosis not present

## 2011-11-07 DIAGNOSIS — N170 Acute kidney failure with tubular necrosis: Secondary | ICD-10-CM | POA: Diagnosis not present

## 2011-11-07 DIAGNOSIS — C19 Malignant neoplasm of rectosigmoid junction: Secondary | ICD-10-CM | POA: Diagnosis present

## 2011-11-07 LAB — URINALYSIS, ROUTINE W REFLEX MICROSCOPIC
Specific Gravity, Urine: 1.027 (ref 1.005–1.030)
Urobilinogen, UA: 1 mg/dL (ref 0.0–1.0)
pH: 5.5 (ref 5.0–8.0)

## 2011-11-07 LAB — BASIC METABOLIC PANEL
BUN: 19 mg/dL (ref 6–23)
CO2: 30 mEq/L (ref 19–32)
Chloride: 94 mEq/L — ABNORMAL LOW (ref 96–112)
GFR calc non Af Amer: >90 mL/min (ref >90)
Glucose, Bld: 131 mg/dL — ABNORMAL HIGH (ref 70–99)
Potassium: 3.5 mEq/L (ref 3.5–5.1)
Sodium: 136 mEq/L (ref 135–145)

## 2011-11-07 LAB — CBC WITH DIFFERENTIAL/PLATELET
Hemoglobin: 13.4 g/dL (ref 13.0–17.0)
Lymphocytes Relative: 10 % — ABNORMAL LOW (ref 12–46)
Lymphs Abs: 1.4 10*3/uL (ref 0.7–4.0)
MCH: 29.7 pg (ref 26.0–34.0)
Monocytes Relative: 11 % (ref 3–12)
Neutro Abs: 10.5 10*3/uL — ABNORMAL HIGH (ref 1.7–7.7)
Neutrophils Relative %: 79 % — ABNORMAL HIGH (ref 43–77)
Platelets: 433 10*3/uL — ABNORMAL HIGH (ref 150–400)
RBC: 4.51 MIL/uL (ref 4.22–5.81)
WBC: 13.4 10*3/uL — ABNORMAL HIGH (ref 4.0–10.5)

## 2011-11-07 LAB — URINE MICROSCOPIC-ADD ON

## 2011-11-07 MED ORDER — SODIUM CHLORIDE 0.9 % IV BOLUS (SEPSIS)
1000.0000 mL | Freq: Once | INTRAVENOUS | Status: AC
Start: 2011-11-07 — End: 2011-11-07
  Administered 2011-11-07: 1000 mL via INTRAVENOUS

## 2011-11-07 MED ORDER — SODIUM CHLORIDE 0.9 % IV SOLN
INTRAVENOUS | Status: DC
  Administered 2011-11-07: 18:00:00 via INTRAVENOUS

## 2011-11-07 MED ORDER — HYDROMORPHONE HCL PF 1 MG/ML IJ SOLN
0.5000 mg | Freq: Once | INTRAMUSCULAR | Status: AC
  Administered 2011-11-07: 0.5 mg via INTRAVENOUS
  Filled 2011-11-07: qty 1

## 2011-11-07 MED ORDER — ONDANSETRON HCL 4 MG/2ML IJ SOLN
4.0000 mg | Freq: Four times a day (QID) | INTRAMUSCULAR | Status: DC | PRN
  Filled 2011-11-07: qty 2

## 2011-11-07 MED ORDER — MORPHINE SULFATE 2 MG/ML IJ SOLN
2.0000 mg | INTRAMUSCULAR | Status: DC | PRN
  Administered 2011-11-07 – 2011-11-09 (×13): 2 mg via INTRAVENOUS
  Administered 2011-11-09: 4 mg via INTRAVENOUS
  Administered 2011-11-09 (×3): 2 mg via INTRAVENOUS
  Administered 2011-11-09: 4 mg via INTRAVENOUS
  Administered 2011-11-09 – 2011-11-10 (×4): 2 mg via INTRAVENOUS
  Filled 2011-11-07 (×5): qty 1
  Filled 2011-11-07: qty 2
  Filled 2011-11-07 (×7): qty 1
  Filled 2011-11-07: qty 2
  Filled 2011-11-07 (×6): qty 1
  Filled 2011-11-07: qty 2
  Filled 2011-11-07: qty 1

## 2011-11-07 MED ORDER — CHLORHEXIDINE GLUCONATE 0.12 % MT SOLN
15.0000 mL | Freq: Two times a day (BID) | OROMUCOSAL | Status: DC
  Administered 2011-11-08 – 2011-11-23 (×31): 15 mL via OROMUCOSAL
  Filled 2011-11-07 (×38): qty 15

## 2011-11-07 MED ORDER — DEXTROSE 5 % IV SOLN
1.0000 g | Freq: Once | INTRAVENOUS | Status: AC
  Administered 2011-11-07: 1 g via INTRAVENOUS
  Filled 2011-11-07: qty 10

## 2011-11-07 MED ORDER — DEXTROSE 5 % IV SOLN
1.0000 g | INTRAVENOUS | Status: DC
  Administered 2011-11-08 – 2011-11-09 (×2): 1 g via INTRAVENOUS
  Filled 2011-11-07 (×4): qty 10

## 2011-11-07 MED ORDER — PANTOPRAZOLE SODIUM 40 MG IV SOLR
40.0000 mg | Freq: Every day | INTRAVENOUS | Status: DC
  Administered 2011-11-07 – 2011-11-22 (×16): 40 mg via INTRAVENOUS
  Filled 2011-11-07 (×19): qty 40

## 2011-11-07 MED ORDER — ENOXAPARIN SODIUM 40 MG/0.4ML ~~LOC~~ SOLN
40.0000 mg | SUBCUTANEOUS | Status: DC
  Administered 2011-11-07 – 2011-11-08 (×2): 40 mg via SUBCUTANEOUS
  Filled 2011-11-07 (×3): qty 0.4

## 2011-11-07 MED ORDER — ONDANSETRON HCL 4 MG/2ML IJ SOLN
4.0000 mg | Freq: Once | INTRAMUSCULAR | Status: AC
  Administered 2011-11-07: 4 mg via INTRAVENOUS
  Filled 2011-11-07: qty 2

## 2011-11-07 MED ORDER — BIOTENE DRY MOUTH MT LIQD
15.0000 mL | Freq: Two times a day (BID) | OROMUCOSAL | Status: DC
  Administered 2011-11-08 – 2011-11-10 (×3): 15 mL via OROMUCOSAL

## 2011-11-07 MED ORDER — KCL IN DEXTROSE-NACL 20-5-0.9 MEQ/L-%-% IV SOLN
INTRAVENOUS | Status: AC
  Administered 2011-11-07 – 2011-11-09 (×5): via INTRAVENOUS
  Filled 2011-11-07 (×7): qty 1000

## 2011-11-07 MED ORDER — INFLUENZA VIRUS VACC SPLIT PF IM SUSP
0.5000 mL | INTRAMUSCULAR | Status: AC
  Administered 2011-11-08: 0.5 mL via INTRAMUSCULAR
  Filled 2011-11-07: qty 0.5

## 2011-11-07 MED ORDER — PNEUMOCOCCAL VAC POLYVALENT 25 MCG/0.5ML IJ INJ: 0.5000 mL | INJECTION | INTRAMUSCULAR | Status: DC | PRN

## 2011-11-07 MED ORDER — MORPHINE SULFATE 2 MG/ML IJ SOLN
INTRAMUSCULAR | Status: AC
  Filled 2011-11-07: qty 1

## 2011-11-07 MED ORDER — HYDROMORPHONE HCL PF 1 MG/ML IJ SOLN
1.0000 mg | Freq: Once | INTRAMUSCULAR | Status: AC
  Administered 2011-11-07: 1 mg via INTRAVENOUS
  Filled 2011-11-07: qty 1

## 2011-11-07 NOTE — ED Notes (Signed)
Pt aware of the need for a urine, urinal at bedside.

## 2011-11-07 NOTE — ED Notes (Signed)
Pt here c/o abd pain that started 2 days ago. Pt being tx for rectal CA. Pt has colostomy and another bag to drain infection from rectum. Pt denies N/V and is non-febrile

## 2011-11-07 NOTE — ED Notes (Signed)
md at bedside  Pt alert and oriented x4. Respirations even and unlabored, bilateral symmetrical rise and fall of chest. Skin warm and dry. In no acute distress. Denies needs.   

## 2011-11-07 NOTE — ED Notes (Signed)
Pt back from x-ray.

## 2011-11-07 NOTE — ED Notes (Signed)
Pt to xray

## 2011-11-07 NOTE — ED Provider Notes (Addendum)
History     CSN: [id number]  Arrival date & time 11/07/11  1506   First MD Initiated Contact with Patient 11/07/11 1545      Chief Complaint  Patient presents with  . Abdominal Pain    (Consider location/radiation/quality/duration/timing/severity/associated sxs/prior treatment) HPI Comments: Jeffery [name] is a 51 y.o. Male to be evaluated for 2 days of abdominal pain. He has had similar pains in the past. He is being treated for rectal carcinoma. He had a diverting colostomy placed in August. He has been in place pelvic drain currently. He saw his surgeon 2 days ago, and was set up for a CT scan in 5 days to evaluate the drain placement. At that time. He had some drainage of excoriation of the drain site in the right buttock. He has not had a fever, chills, nausea, or vomiting. His pain is in the lower abdomen. There are no known aggravating factors. He takes Percocet without relief. He denies cough, shortness of breath, chest pain, back pain, weakness, or dizziness.  Patient is a 51 y.o. male presenting with abdominal pain. The history is provided by the patient.  Abdominal Pain The primary symptoms of the illness include abdominal pain.    Past Medical History  Diagnosis Date  . Hypertension     no meds  . Cancer     rectal   . 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 Multiple tooth extractions     9/11  . Portacath placement 04/12/2011    Procedure: INSERTION PORT-A-CATH;  Surgeon: Almond Lint, MD;  Location: [facility];  Service: General;  Laterality: N/A;  port a cath insertion  . Colon surgery 12/11    sbo  . Colon surgery 8/12    colon resection-iliostomy  . Colon surgery 2012    iliostomy takedown  . Colostomy 10/05/2011    Procedure: COLOSTOMY;  Surgeon: Almond Lint, MD;  Location: [facility];   Service: General;  Laterality: N/A;  open colostomy  . Examination under anesthesia 10/05/2011    Procedure: EXAM UNDER ANESTHESIA;  Surgeon: Almond Lint, MD;  Location: [facility];  Service: General;  Laterality: N/A;  exam under anesthesia  . Proctoscopy 10/05/2011    Procedure: PROCTOSCOPY;  Surgeon: Almond Lint, MD;  Location: [facility];  Service: General;  Laterality: N/A;  rigid proctoscopy  . Laparoscopy 10/05/2011    Procedure: LAPAROSCOPY DIAGNOSTIC;  Surgeon: Almond Lint, MD;  Location: [facility];  Service: General;  Laterality: N/A;    Family History  Problem Relation Age of Onset  . Cancer Mother     lung    History  Substance Use Topics  . Smoking status: Current Every Day Smoker -- 0.2 packs/day for 12 years    Types: Cigarettes  . Smokeless tobacco: Never Used  . Alcohol Use: Yes     has quit drinking since last week.       Review of Systems  Gastrointestinal: Positive for abdominal pain.  All other systems reviewed and are negative.    Allergies  Review of patient's allergies indicates no known allergies.  Home Medications   Current Outpatient Rx  Name Route Sig Dispense Refill  . AMLODIPINE BESYLATE 5 MG PO TABS Oral Take 2 tablets (10 mg total) by mouth daily. 30 tablet 3  . LIDOCAINE-PRILOCAINE 2.5-2.5 %  EX CREA Topical Apply topically as needed. Apply to [facility] site 1-2 hours prior to stick and cover with plastic wrap 30 g prn  . OXYCODONE-ACETAMINOPHEN 10-325 MG PO TABS Oral Take 1-2 tablets by mouth every 4 (four) hours as needed for pain. 70 tablet 0  . PRESCRIPTION MEDICATION  Pt's a chemo patient and [facility]. Last treatment was 2 months ago. Pt states his next scheduled chemo is in September 2013. Followed by Dr Truett Perna      BP 136/99  Pulse 126  Temp 98.1 F (36.7 C) (Oral)  Resp 21  SpO2 95%  Physical Exam  Nursing note and vitals reviewed. Constitutional: He is oriented to person, place, and time. He appears well-developed and  well-nourished.  HENT:  Head: Normocephalic and atraumatic.  Right Ear: External ear normal.  Left Ear: External ear normal.  Eyes: Conjunctivae normal and EOM are normal. Pupils are equal, round, and reactive to light.  Neck: Normal range of motion and phonation normal. Neck supple.  Cardiovascular: Normal rate, regular rhythm, normal heart sounds and intact distal pulses.   Pulmonary/Chest: Effort normal and breath sounds normal. He exhibits no bony tenderness.  Abdominal: Soft. Normal appearance. There is no tenderness.       Moderate bilateral lower quadrant tenderness. Colostomy left lower quadrant.  Musculoskeletal: Normal range of motion.  Neurological: He is alert and oriented to person, place, and time. He has normal strength. No cranial nerve deficit or sensory deficit. He exhibits normal muscle tone. Coordination normal.  Skin: Skin is warm, dry and intact.       The percutaneous drain site in right buttock. Has mild, surrounding erythema, and minimal discharge. No associated fluctuance  Psychiatric: He has a normal mood and affect. His behavior is normal. Judgment and thought content normal.    ED Course  Procedures (including critical care time)   Emergency department treatment: IV fluid bolus, and drip. IV, Zofran, and Dilaudid. NG tube low intermittent suction for small bowel obstruction. Redosed allowed for recurrent pain.  Case discussed with on-call general surgeon. Dr. Johna Sheriff will see the patient for admission.    Date: 11/07/2011  Rate: 123  Rhythm: sinus tachycardia  QRS Axis: normal  Intervals: normal  ST/T Wave abnormalities: nonspecific ST changes  Conduction Disutrbances:none  Narrative Interpretation:   Old EKG Reviewed: unchanged   Labs Reviewed  CBC WITH DIFFERENTIAL - Abnormal; Notable for the following:    WBC 13.4 (*)     Platelets 433 (*)     Neutrophils Relative 79 (*)     Neutro Abs 10.5 (*)     Lymphocytes Relative 10 (*)     Monocytes  Absolute 1.5 (*)     All other components within normal limits  BASIC METABOLIC PANEL - Abnormal; Notable for the following:    Chloride 94 (*)     Glucose, Bld 131 (*)     All other components within normal limits  URINALYSIS, ROUTINE W REFLEX MICROSCOPIC - Abnormal; Notable for the following:    Color, Urine AMBER (*)  BIOCHEMICALS MAY BE AFFECTED BY COLOR   APPearance CLOUDY (*)     Hgb urine dipstick SMALL (*)     Bilirubin Urine SMALL (*)     Ketones, ur 15 (*)     Protein, ur 100 (*)     Nitrite POSITIVE (*)     Leukocytes, UA MODERATE (*)     All other components within normal limits  URINE MICROSCOPIC-ADD ON -  Abnormal; Notable for the following:    Bacteria, UA MANY (*)     All other components within normal limits  LIPASE, BLOOD  URINE CULTURE   Dg Abd Acute W/chest  11/07/2011  *RADIOLOGY REPORT*  Clinical Data: Abdominal pain, distention, nausea and vomiting  ACUTE ABDOMEN SERIES (ABDOMEN 2 VIEW & CHEST 1 VIEW)  Comparison: 10/18/2011  Findings: Cardiomediastinal silhouette is unremarkable.  Right subclavian Port-A-Cath with tip in the distal SVC.  Multiple right lung nodules are noted consistent with metastatic disease.  Largest right perihilar region measures 1.9 cm.  Smaller nodules are noted left lower lobe suspicious for metastatic disease.  Marked small bowel distention with air-fluid levels left and mid abdomen highly suspicious for small bowel obstruction.  A drainage catheter is noted in the right lower quadrant. No free abdominal air.  IMPRESSION: Multiple right lung nodules are noted consistent with metastatic disease.  Largest right perihilar region measures 1.9 cm.  Smaller nodules are noted left lower lobe suspicious for metastatic disease.  Marked small bowel distention with air-fluid levels left and mid abdomen highly suspicious for small bowel obstruction.  A drainage catheter is noted in the right lower quadrant. No free abdominal air.   Original Report  Authenticated By: Natasha Mead, M.D.      1. Small bowel obstruction   2. UTI (lower urinary tract infection)   3. Rectal carcinoma       MDM  Small bowel obstruction with a recent diverting colostomy placement. Rectal carcinoma, with pelvic drainage, and possible infected tract. Patient needs admission for further monitoring and treatment. You tract infection is present. Is treated with Rocephin, IV.  Plan: Admit- Gen. Surg.        Flint Melter, MD 11/07/11 1833  Flint Melter, MD 11/07/11 [phone]

## 2011-11-07 NOTE — H&P (Addendum)
Jeffery [name] is an 51 y.o. male.   Chief Complaint: abdominal pain HPI: Jeffery [name] was diagnosed with rectal cancer in 2011. He was treated with neoadjuvant radiation and underwent a laparoscopic assisted low anterior resection with a diverting loop ileostomy on 01/14/2010.  The final pathology revealed invasive adenocarcinoma extending into the perirectal connective tissue with negative resection margins. He began adjuvant CAPOX chemotherapy 03/11/2010 and completed a final cycle on 06/25/2010.  In January 2013 a CT confirmed multiple nodules at the lung bases consistent with metastatic disease. A biopsy on 04/20/2011 revealed metastatic adenocarcinoma consistent with a colorectal primary.  He has been treated with FOLFIRI/Avastin since March of 2013. Approximately 2 months ago he presented with an abscess adjacent to his rectum felt to be secondary to malignant perforation or anastomotic leak. He underwent percutaneous drainage. Subsequently Dr. [name] performed a laparoscopic assisted left colostomy for diversion. This was one month ago. Multiple rectal biopsies were done at that time which were negative for malignancy.The patient was readmitted not long after surgery due to apparent small bowel obstruction or possible obstipation. He responded to conservative measures and ostomy irrigation and was discharged.  The patient states that 2 days ago he had the onset of recurrent abdominal pain. He describes fairly severe crampy mid abdominal diffuse pain. He's also had some pain in his testicles and back. He's not had any nausea or vomiting. He states his colostomy has continued to put out fairly normal output. He denies fever chills. He denies urinary symptoms. He presented to the emergency department today because of the pain.   Past Medical History  Diagnosis Date  . Hypertension     no meds  . Cancer     rectal   . ADENOCARCINOMA, RECTUM, [id number] 09/19/2009    Qualifier: Diagnosis of  By: Melvyn Neth  CMA (AAMA), Patty    . 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 Multiple tooth extractions     9/11  . Portacath placement 04/12/2011    Procedure: INSERTION PORT-A-CATH;  Surgeon: Almond Lint, MD;  Location:  [facility];  Service: General;  Laterality: N/A;  port a cath insertion  . Colon surgery 12/11    sbo  . Colon surgery 8/12    colon resection-iliostomy  . Colon surgery 2012    iliostomy takedown  . Colostomy 10/05/2011    Procedure: COLOSTOMY;  Surgeon: Almond Lint, MD;  Location: [facility];  Service: General;  Laterality: N/A;  open colostomy  . Examination under anesthesia 10/05/2011    Procedure: EXAM UNDER ANESTHESIA;  Surgeon: Almond Lint, MD;  Location: [facility];  Service: General;  Laterality: N/A;  exam under anesthesia  . Proctoscopy 10/05/2011    Procedure: PROCTOSCOPY;  Surgeon: Almond Lint, MD;  Location: [facility];  Service: General;  Laterality: N/A;  rigid proctoscopy  . Laparoscopy 10/05/2011    Procedure: LAPAROSCOPY DIAGNOSTIC;  Surgeon: Almond Lint, MD;  Location: [facility];  Service: General;  Laterality: N/A;    Current Facility-Administered Medications  Medication Dose Route Frequency Provider Last Rate Last Dose  . 0.9 %  sodium chloride infusion   Intravenous Continuous Flint Melter, MD 125 mL/hr at 11/07/11 1807    . cefTRIAXone (ROCEPHIN) 1 g in dextrose 5 % 50 mL IVPB  1 g Intravenous Once Flint Melter, MD   1 g at 11/07/11 1850  . HYDROmorphone (DILAUDID) injection 0.5 mg  0.5 mg Intravenous Once [employer]  Rachel Moulds, MD   0.5 mg at 11/07/11 1850  . HYDROmorphone (DILAUDID) injection 1 mg  1 mg Intravenous Once Flint Melter, MD   1 mg at 11/07/11 1657  . ondansetron (ZOFRAN) injection 4 mg  4 mg Intravenous Once Flint Melter, MD   4 mg at 11/07/11 1657  . sodium chloride 0.9 % bolus 1,000 mL  1,000 mL Intravenous Once Flint Melter, MD   1,000 mL at 11/07/11 1657   Current Outpatient  Prescriptions  Medication Sig Dispense Refill  . amLODipine (NORVASC) 5 MG tablet Take 2 tablets (10 mg total) by mouth daily.  30 tablet  3  . lidocaine-prilocaine (EMLA) cream Apply topically as needed. Apply to [facility] site 1-2 hours prior to stick and cover with plastic wrap  30 g  prn  . oxyCODONE-acetaminophen (PERCOCET) 10-325 MG per tablet Take 1-2 tablets by mouth every 4 (four) hours as needed for pain.  70 tablet  0  . PRESCRIPTION MEDICATION Pt's a chemo patient and [facility]. Last treatment was 2 months ago. Pt states his next scheduled chemo is in September 2013. Followed by Dr Truett Perna         Family History  Problem Relation Age of Onset  . Cancer Mother     lung   Social History:  reports that he has been smoking Cigarettes.  He has a 3 pack-year smoking history. He has never used smokeless tobacco. He reports that he drinks alcohol. He reports that he does not use illicit drugs.  Allergies: No Known Allergies   (Not in a hospital admission)  Results for orders placed during the hospital encounter of 11/07/11 (from the past 48 hour(s))  URINALYSIS, ROUTINE W REFLEX MICROSCOPIC     Status: Abnormal   Collection Time   11/07/11  4:45 PM      Component Value Range Comment   Color, Urine AMBER (*) YELLOW BIOCHEMICALS MAY BE AFFECTED BY COLOR   APPearance CLOUDY (*) CLEAR    Specific Gravity, Urine 1.027  1.005 - 1.030    pH 5.5  5.0 - 8.0    Glucose, UA NEGATIVE  NEGATIVE mg/dL    Hgb urine dipstick SMALL (*) NEGATIVE    Bilirubin Urine SMALL (*) NEGATIVE    Ketones, ur 15 (*) NEGATIVE mg/dL    Protein, ur 960 (*) NEGATIVE mg/dL    Urobilinogen, UA 1.0  0.0 - 1.0 mg/dL    Nitrite POSITIVE (*) NEGATIVE    Leukocytes, UA MODERATE (*) NEGATIVE   URINE MICROSCOPIC-ADD ON     Status: Abnormal   Collection Time   11/07/11  4:45 PM      Component Value Range Comment   WBC, UA TOO NUMEROUS TO COUNT  <3 WBC/hpf    RBC / HPF 0-2  <3 RBC/hpf    Bacteria, UA MANY (*)  RARE    Urine-Other MUCOUS PRESENT     CBC WITH DIFFERENTIAL     Status: Abnormal   Collection Time   11/07/11  5:00 PM      Component Value Range Comment   WBC 13.4 (*) 4.0 - 10.5 K/uL    RBC 4.51  4.22 - 5.81 MIL/uL    Hemoglobin 13.4  13.0 - 17.0 g/dL    HCT 45.4  09.8 - 11.9 %    MCV 89.8  78.0 - 100.0 fL    MCH 29.7  26.0 - 34.0 pg    MCHC 33.1  30.0 - 36.0  g/dL    RDW 16.1  09.6 - 04.5 %    Platelets 433 (*) 150 - 400 K/uL    Neutrophils Relative 79 (*) 43 - 77 %    Neutro Abs 10.5 (*) 1.7 - 7.7 K/uL    Lymphocytes Relative 10 (*) 12 - 46 %    Lymphs Abs 1.4  0.7 - 4.0 K/uL    Monocytes Relative 11  3 - 12 %    Monocytes Absolute 1.5 (*) 0.1 - 1.0 K/uL    Eosinophils Relative 0  0 - 5 %    Eosinophils Absolute 0.0  0.0 - 0.7 K/uL    Basophils Relative 0  0 - 1 %    Basophils Absolute 0.0  0.0 - 0.1 K/uL   BASIC METABOLIC PANEL     Status: Abnormal   Collection Time   11/07/11  5:00 PM      Component Value Range Comment   Sodium 136  135 - 145 mEq/L    Potassium 3.5  3.5 - 5.1 mEq/L    Chloride 94 (*) 96 - 112 mEq/L    CO2 30  19 - 32 mEq/L    Glucose, Bld 131 (*) 70 - 99 mg/dL    BUN 19  6 - 23 mg/dL    Creatinine, Ser 4.09  0.50 - 1.35 mg/dL    Calcium 81.1  8.4 - 10.5 mg/dL    GFR calc non Af Amer >90  >90 mL/min    GFR calc Af Amer >90  >90 mL/min   LIPASE, BLOOD     Status: Normal   Collection Time   11/07/11  5:00 PM      Component Value Range Comment   Lipase 38  11 - 59 U/L    Dg Abd Acute W/chest  11/07/2011  *RADIOLOGY REPORT*  Clinical Data: Abdominal pain, distention, nausea and vomiting  ACUTE ABDOMEN SERIES (ABDOMEN 2 VIEW & CHEST 1 VIEW)  Comparison: 10/18/2011  Findings: Cardiomediastinal silhouette is unremarkable.  Right subclavian Port-A-Cath with tip in the distal SVC.  Multiple right lung nodules are noted consistent with metastatic disease.  Largest right perihilar region measures 1.9 cm.  Smaller nodules are noted left lower lobe suspicious for  metastatic disease.  Marked small bowel distention with air-fluid levels left and mid abdomen highly suspicious for small bowel obstruction.  A drainage catheter is noted in the right lower quadrant. No free abdominal air.  IMPRESSION: Multiple right lung nodules are noted consistent with metastatic disease.  Largest right perihilar region measures 1.9 cm.  Smaller nodules are noted left lower lobe suspicious for metastatic disease.  Marked small bowel distention with air-fluid levels left and mid abdomen highly suspicious for small bowel obstruction.  A drainage catheter is noted in the right lower quadrant. No free abdominal air.   Original Report Authenticated By: Natasha Mead, M.D.     Review of Systems  Constitutional: Negative for fever and chills.  Respiratory: Negative.   Cardiovascular: Negative for chest pain, palpitations and leg swelling.  Gastrointestinal: Positive for abdominal pain and blood in stool. Negative for nausea and vomiting.  Genitourinary: Negative.     Blood pressure 126/87, pulse 129, temperature 98.1 F (36.7 C), temperature source Oral, resp. rate 22, SpO2 96.00%. Physical Exam  General: Thin African American male who is alert and in no distress Skin: Warm and dry without rash or infection HEENT: No palpable mass or thyromegaly. Sclera nonicteric. Pupils equal and reactive. Lymph nodes: No cervical  or subclavicular nodes palpable. There are some shotty palpable nodes in both inguinal areas Lungs: Clear equal breath sounds without increased work of breathing Cardiovascular: Regular rate and rhythm without murmurs. No JVD or edema. Abdomen: Moderately distended and tympanitic. Nontender. Well-healed incisions. The stoma present in the left lower quadrant with no content in the bag currently. No hernias. Drainage catheter present in right gluteal area with apparent feculent drainage. NG tube is in place with bilious drainage. Extremities: No joint swelling or  edema Neurologic: He is alert and oriented. Affect normal.  Assessment/Plan Unfortunate 51 year old male with metastatic rectal cancer. He has had a recent rectal perforation status post percutaneous drain and diverging left colostomy. He had an early postop bowel obstruction that resolved spontaneously. He appears to have at this point another small bowel obstruction. His abdomen is benign. The patient will be admitted. NG tube is in place. IV fluids and pain medicine ordered. CT scan was planned this week to evaluate drain placement and will likely be ordered during this hospitalization.  Patient also has an apparent UTI. Urine culture has been sent and he has been started on Rocephin  Charlize Hathaway T 11/07/2011, 7:10 PM

## 2011-11-07 NOTE — ED Notes (Signed)
Pt reports hx of rectal cancer, spread to lungs. Pt has colostomy and another drainage tube to rectum. Pt reports central abdominal pain x2 days. 5/10. Alert and oriented x4. Denies dysuria. Denies vomitting.

## 2011-11-07 NOTE — ED Notes (Signed)
Pt began vomiting right after rn accessed port and before zofran given. Pt reports colostomy placed a little over a month ago. Reports usually changes bag 3-4 times/ day. Pt reports no drainage today.

## 2011-11-08 ENCOUNTER — Telehealth (INDEPENDENT_AMBULATORY_CARE_PROVIDER_SITE_OTHER): Payer: Self-pay

## 2011-11-08 ENCOUNTER — Inpatient Hospital Stay (HOSPITAL_COMMUNITY): Payer: Medicaid Other

## 2011-11-08 LAB — BASIC METABOLIC PANEL
BUN: 18 mg/dL (ref 6–23)
Calcium: 8.9 mg/dL (ref 8.4–10.5)
GFR calc Af Amer: >90 mL/min (ref >90)
GFR calc non Af Amer: >90 mL/min (ref >90)
Potassium: 3.8 mEq/L (ref 3.5–5.1)
Sodium: 140 mEq/L (ref 135–145)

## 2011-11-08 LAB — CBC
Hemoglobin: 11.6 g/dL — ABNORMAL LOW (ref 13.0–17.0)
MCHC: 32.2 g/dL (ref 30.0–36.0)
Platelets: 329 10*3/uL (ref 150–400)

## 2011-11-08 MED ORDER — SODIUM CHLORIDE 0.9 % IJ SOLN
10.0000 mL | INTRAMUSCULAR | Status: DC | PRN
  Administered 2011-11-14: 20 mL
  Administered 2011-11-25 – 2011-11-29 (×4): 10 mL

## 2011-11-08 MED ORDER — METOPROLOL TARTRATE 1 MG/ML IV SOLN
5.0000 mg | Freq: Four times a day (QID) | INTRAVENOUS | Status: DC
  Administered 2011-11-08 – 2011-11-10 (×7): 5 mg via INTRAVENOUS
  Filled 2011-11-08 (×11): qty 5

## 2011-11-08 NOTE — Care Management Note (Signed)
    Page 1 of 2   11/25/2011     2:58:11 PM   CARE MANAGEMENT NOTE 11/25/2011  Patient:  XAVI, TOMASIK A   Account Number:  [id number]  Date Initiated:  11/08/2011  Documentation initiated by:  Lorenda Ishihara  Subjective/Objective Assessment:   51 yo male admitted with SBO. Prior history of SBO, rectal cancer with colon resection and colostomy. PTA lived at home with family.     Action/Plan:   HOME   Anticipated DC Date:  11/29/2011   Anticipated DC Plan:  HOME W HOME HEALTH SERVICES  In-house referral  Clinical Social Worker      DC Planning Services  CM consult      PAC Choice  DURABLE MEDICAL EQUIPMENT  HOME HEALTH   Choice offered to / List presented to:  C-1 Patient   DME arranged  Levan Hurst      DME agency  [facility]     HH arranged  HH-1 RN  HH-2 PT      [facility] agency  [facility]   Status of service:  Completed, signed off Medicare Important Message given?  NA - LOS <3 / Initial given by admissions (If response is "NO", the following Medicare IM given date fields will be blank) Date Medicare IM given:   Date Additional Medicare IM given:    Discharge Disposition:  HOME W HOME HEALTH SERVICES  Per UR Regulation:  Reviewed for med. necessity/level of care/duration of stay  If discussed at Long Length of Stay Meetings, dates discussed:   11/02/2011  11/04/2011  11/08/2011  11/11/2011  11/16/2011  11/18/2011  11/25/2011    Comments:  11-25-11 Lorenda Ishihara RN CM [phone] Spoke with patient at bedside. States he is active with [facility] already. Discussed need for nurse and PT at home. Patient agreeable to services. Contacted Darl Pikes with [facility] to arrange, active with nursing currently will at PT as per recommendations.  [id number] Earlene Plater, RN, BSN, CCM: Patient extubated this am, tolerated well, fld in abd remains a concern last ir drainage on [id number] remains elevated, on iv anti-hypoertensives .  [id number] Earlene Plater, RN, BSN,  CCM: CHART REVIEWED AND UPDATED. NO DISCHARGE NEEDS PRESENT AT THIS TIME./wbc increased from 13-21, pt failed the am weaning trail after 4 minutes. CASE MANAGEMENT [phone]    [id number]/Rhonda Davis,Rn,BSN,CCM: patient unresponsive and reintubated x3.  Terminal wean planned for Wednesday Oct. 2,2013.  [id number] Earlene Plater, RN, BSN, CCM: CHART REVIEWED AND UPDATED. NO DISCHARGE NEEDS PRESENT AT THIS TIME. CASE MANAGEMENT [phone]

## 2011-11-08 NOTE — Progress Notes (Signed)
INITIAL ADULT NUTRITION ASSESSMENT Date: 11/08/2011   Time: 1:57 PM Reason for Assessment: Nutrition Risk  ASSESSMENT: Male 51 y.o.  Dx: UTI, SBO/ileus  Hx:  Past Medical History  Diagnosis Date  . Hypertension     no meds  . Cancer     rectal   . 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 Multiple tooth extractions     9/11  . Portacath placement 04/12/2011    Procedure: INSERTION PORT-A-CATH;  Surgeon: Almond Lint, MD;  Location: [facility];  Service: General;  Laterality: N/A;  port a cath insertion  . Colon surgery 12/11    sbo  . Colon surgery 8/12    colon resection-iliostomy  . Colon surgery 2012    iliostomy takedown  . Colostomy 10/05/2011    Procedure: COLOSTOMY;  Surgeon: Almond Lint, MD;  Location: [facility] OR;  Service: General;  Laterality: N/A;  open colostomy  . Examination under anesthesia 10/05/2011    Procedure: EXAM UNDER ANESTHESIA;  Surgeon: Almond Lint, MD;  Location: [facility] OR;  Service: General;  Laterality: N/A;  exam under anesthesia  . Proctoscopy 10/05/2011    Procedure: PROCTOSCOPY;  Surgeon: Almond Lint, MD;  Location: [facility] OR;  Service: General;  Laterality: N/A;  rigid proctoscopy  . Laparoscopy 10/05/2011    Procedure: LAPAROSCOPY DIAGNOSTIC;  Surgeon: Almond Lint, MD;  Location: [facility];  Service: General;  Laterality: N/A;    Related Meds:     . antiseptic oral rinse  15 mL Mouth Rinse q12n4p  . cefTRIAXone (ROCEPHIN) IVPB 1 gram/50 mL D5W  1 g Intravenous Once  . cefTRIAXone (ROCEPHIN)  IV  1 g Intravenous Q24H  . chlorhexidine  15 mL Mouth Rinse BID  . enoxaparin  40 mg Subcutaneous Q24H  .  HYDROmorphone (DILAUDID) injection  0.5 mg Intravenous Once  .  HYDROmorphone (DILAUDID) injection  1 mg Intravenous Once  . influenza  inactive virus vaccine  0.5 mL Intramuscular  Tomorrow-1000  . morphine      . ondansetron (ZOFRAN) IV  4 mg Intravenous Once  . pantoprazole (PROTONIX) IV  40 mg Intravenous QHS  . sodium chloride  1,000 mL Intravenous Once     Ht: 5' 4.17" (163 cm)  Wt: 128 lb 15.5 oz (58.5 kg)  Ideal Wt: 59 kg % Ideal Wt: 99  Usual Wt: 142# Wt Readings from Last 10 Encounters:  11/07/11 128 lb 15.5 oz (58.5 kg)  11/05/11 129 lb (58.514 kg)  11/05/11 129 lb (58.514 kg)  11/01/11 126 lb 9.6 oz (57.425 kg)  10/12/11 134 lb (60.782 kg)  10/07/11 138 lb 7.2 oz (62.8 kg)  10/07/11 138 lb 7.2 oz (62.8 kg)  09/29/11 137 lb 4.8 oz (62.279 kg)  09/27/11 138 lb (62.596 kg)  09/16/11 139 lb 7 oz (63.248 kg)    % Usual Wt: 91  Body mass index is 22.02 kg/(m^2).   Labs:  CMP     Component Value Date/Time   NA 140 11/08/2011 0500   K 3.8 11/08/2011 0500   CL 102 11/08/2011 0500   CO2 30 11/08/2011 0500   GLUCOSE 135* 11/08/2011 0500   BUN 18 11/08/2011 0500   CREATININE 0.69 11/08/2011 0500   CALCIUM 8.9 11/08/2011 0500   PROT 7.1 10/21/2011 0424   ALBUMIN 2.6* 10/21/2011 0424  AST 25 10/21/2011 0424   ALT 27 10/21/2011 0424   ALKPHOS 79 10/21/2011 0424   BILITOT 0.3 10/21/2011 0424   GFRNONAA >90 11/08/2011 0500   GFRAA >90 11/08/2011 0500    I/O last 3 completed shifts: In: 1006.3 [I.V.:1006.3] Out: 580 [Urine:300; Emesis/NG output:250; Drains:30] Total I/O In: 0  Out: 370 [Urine:250; Emesis/NG output:100; Drains:20]   Diet Order: NPO  Supplements/Tube Feeding:  none  IVF:    dextrose 5 % and 0.9 % NaCl with KCl 20 mEq/L Last Rate: 125 mL/hr at 11/08/11 1610  DISCONTD: sodium chloride Last Rate: 125 mL/hr at 11/07/11 1807    Estimated Nutritional Needs:   Kcal: 1700-1900 Protein: 80-90 gm  Fluid: >1.7L  Food/Nutrition Related Hx: Regular diet prior to admit.  Has not eaten well since Friday.  Drank Ensure Complete prior to admit.  Pt with 9% weight loss in the last 3 months.  Pt meets criteria for non-severe malnutrition related  to chronic disease AEB 9% weight loss in the past 3 month and milk decrease in body fat.  NUTRITION DIAGNOSIS: -Inadequate oral intake (NI-2.1).  Status: Ongoing  RELATED TO: inability to eat  AS EVIDENCE BY: npo status  MONITORING/EVALUATION(Goals): Monitor:  Nutrition plan of care, diet advancement as medically appropriate, labs, weight. Goal:  Based on plan of care.  EDUCATION NEEDS: -No education needs identified at this time  INTERVENTION: If extended NPO needed, recommend TPN. Will add Ensure Complete bid when diet advanced.  Dietitian [phone]  DOCUMENTATION CODES Per approved criteria  -Non-severe (moderate) malnutrition in the context of chronic illness    Jeffery [name] Jeffery [name] 11/08/2011, 1:57 PM

## 2011-11-08 NOTE — Consult Note (Signed)
WOC ostomy consult  Stoma type/location: LLQ Colostomy Stomal assessment/size: 1 and 1/8 inch round, moist, non-functioning stoma Peristomal assessment: intact, clear Treatment options for stomal/peristomal skin: none indicated Output None Ostomy pouching: 2pc. Initially, post irrigation, I placed him in a 1-piece, Karaya seal pouching system for ease in removal with daily irrigations. 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 Old pouching system removed and discarded.  Belt and sleeve attached. Instructed patient and family how to maneuver this system in their bathroom at home and provided with a coat-hanger to use for placement over the shower rod to achieve the correct height for irrigation bag. warm tap water instilled into stoma without difficulty pausing once for patient to relax abdominal muscles.  Patient tolerated procedure well.  No otoput from stoma after 1 hour.  Irrigation system removed and cleansed; left at bedside so that we can repeat procedure should that be Dr. Arita Miss wish.  I will follow along with you. Thanks for this referral. Ladona Mow, MSN, RN, [facility], CWOCN   [phone])

## 2011-11-08 NOTE — Progress Notes (Signed)
  Subjective: Pt has improvement in pain.  Denies nausea this am.  No stool output in ostomy for 2 days.    Objective: Vital signs in last 24 hours: Temp:  [98.1 F (36.7 C)-98.5 F (36.9 C)] 98.5 F (36.9 C) (09/23 0530) Pulse Rate:  [95-133] 95  (09/23 0530) Resp:  [12-22] 16  (09/23 0530) BP: (116-136)/(78-99) 116/78 mmHg (09/23 0530) SpO2:  [90 %-100 %] 98 % (09/23 0530) Weight:  [128 lb 15.5 oz (58.5 kg)] 128 lb 15.5 oz (58.5 kg) (09/22 2117) Last BM Date: 11/07/11 (colostomy to lt quad with brown stool noted)  Intake/Output from previous day: 09/22 0701 - 09/23 0700 In: 1006.3 [I.V.:1006.3] Out: 580 [Urine:300; Emesis/NG output:250; Drains:30] Intake/Output this shift:    General appearance: alert, cooperative and no distress Resp: no respiratory distress GI: soft, sl distended,  non tender Extremities: extremities normal, atraumatic, no cyanosis or edema  Lab Results:   Basename 11/08/11 0500 11/07/11 1700  WBC 7.5 13.4*  HGB 11.6* 13.4  HCT 36.0* 40.5  PLT 329 433*   BMET  Basename 11/08/11 0500 11/07/11 1700  NA 140 136  K 3.8 3.5  CL 102 94*  CO2 30 30  GLUCOSE 135* 131*  BUN 18 19  CREATININE 0.69 0.70  CALCIUM 8.9 10.1   PT/INR No results found for this basename: LABPROT:2,INR:2 in the last 72 hours ABG No results found for this basename: PHART:2,PCO2:2,PO2:2,HCO3:2 in the last 72 hours  Studies/Results: Dg Abd Acute W/chest  11/07/2011  *RADIOLOGY REPORT*  Clinical Data: Abdominal pain, distention, nausea and vomiting  ACUTE ABDOMEN SERIES (ABDOMEN 2 VIEW & CHEST 1 VIEW)  Comparison: 10/18/2011  Findings: Cardiomediastinal silhouette is unremarkable.  Right subclavian Port-A-Cath with tip in the distal SVC.  Multiple right lung nodules are noted consistent with metastatic disease.  Largest right perihilar region measures 1.9 cm.  Smaller nodules are noted left lower lobe suspicious for metastatic disease.  Marked small bowel distention with  air-fluid levels left and mid abdomen highly suspicious for small bowel obstruction.  A drainage catheter is noted in the right lower quadrant. No free abdominal air.  IMPRESSION: Multiple right lung nodules are noted consistent with metastatic disease.  Largest right perihilar region measures 1.9 cm.  Smaller nodules are noted left lower lobe suspicious for metastatic disease.  Marked small bowel distention with air-fluid levels left and mid abdomen highly suspicious for small bowel obstruction.  A drainage catheter is noted in the right lower quadrant. No free abdominal air.   Original Report Authenticated By: Natasha Mead, M.D.     Anti-infectives: Anti-infectives     Start     Dose/Rate Route Frequency Ordered Stop   11/07/11 2200   cefTRIAXone (ROCEPHIN) 1 g in dextrose 5 % 50 mL IVPB        1 g 100 mL/hr over 30 Minutes Intravenous Every 24 hours 11/07/11 2124     11/07/11 1830   cefTRIAXone (ROCEPHIN) 1 g in dextrose 5 % 50 mL IVPB        1 g 100 mL/hr over 30 Minutes Intravenous  Once 11/07/11 1819 11/07/11 1920          Assessment/Plan: s/p * No surgery found * UTI - continue rocephin. SBO/ileus - irrigations via ostomy. Will check NGT placement today.  May need to be advanced.   Plan CT scan when less bowel dilation.    LOS: 1 day    [facility] 11/08/2011

## 2011-11-08 NOTE — Telephone Encounter (Signed)
Called to f/u on 11/05/11 phone call.  No answer.  Pt seen in ED on 11/07/11.

## 2011-11-09 ENCOUNTER — Inpatient Hospital Stay (HOSPITAL_COMMUNITY): Payer: Medicaid Other

## 2011-11-09 LAB — COMPREHENSIVE METABOLIC PANEL
ALT: 11 U/L (ref 0–53)
AST: 10 U/L (ref 0–37)
Albumin: 3.1 g/dL — ABNORMAL LOW (ref 3.5–5.2)
CO2: 27 mEq/L (ref 19–32)
Calcium: 8.9 mg/dL (ref 8.4–10.5)
Sodium: 136 mEq/L (ref 135–145)
Total Protein: 7.8 g/dL (ref 6.0–8.3)

## 2011-11-09 LAB — URINE CULTURE

## 2011-11-09 LAB — PREALBUMIN: Prealbumin: 13.1 mg/dL — ABNORMAL LOW (ref 17.0–34.0)

## 2011-11-09 MED ORDER — ENOXAPARIN SODIUM 40 MG/0.4ML ~~LOC~~ SOLN
40.0000 mg | SUBCUTANEOUS | Status: DC
  Administered 2011-11-10: 40 mg via SUBCUTANEOUS
  Filled 2011-11-09 (×3): qty 0.4

## 2011-11-09 MED ORDER — KCL IN DEXTROSE-NACL 20-5-0.9 MEQ/L-%-% IV SOLN
INTRAVENOUS | Status: DC
  Administered 2011-11-09: 21:00:00 via INTRAVENOUS
  Filled 2011-11-09 (×3): qty 1000

## 2011-11-09 MED ORDER — CLINIMIX E/DEXTROSE (5/20) 5 % IV SOLN
INTRAVENOUS | Status: DC
  Administered 2011-11-09: 22:00:00 via INTRAVENOUS
  Filled 2011-11-09: qty 1000

## 2011-11-09 NOTE — Progress Notes (Signed)
Patient states he has flushed his own drain today

## 2011-11-09 NOTE — Progress Notes (Signed)
PARENTERAL NUTRITION CONSULT NOTE - INITIAL  Pharmacy Consult for TPN Indication: Bowel obstruction  No Known Allergies  Patient Measurements: Height: 5' 4.17" (163 cm) Weight: 128 lb 15.5 oz (58.5 kg) [name] : 59.6  Adjusted Body Weight:  Usual Weight: 142 lbs  Vital Signs: Temp: 98.2 F (36.8 C) (09/24 1400) Temp src: Oral (09/24 1400) BP: 153/101 mmHg (09/24 1400) Pulse Rate: 101  (09/24 1400)  Intake/Output from previous day: 09/23 0701 - 09/24 0700 In: 3786.3 [I.V.:2956.3; NG/GT:30; IV Piggyback:50] Out: 1040 [Urine:450; Emesis/NG output:550; Drains:40]  Labs:  Basename 11/08/11 0500 11/07/11 1700  WBC 7.5 13.4*  HGB 11.6* 13.4  HCT 36.0* 40.5  PLT 329 433*  APTT -- --  INR -- --     Basename 11/09/11 0510 11/08/11 0500 11/07/11 1700  NA 136 140 136  K 3.8 3.8 3.5  CL 101 102 94*  CO2 27 30 30   GLUCOSE 137* 135* 131*  BUN 9 18 19   CREATININE 0.54 0.69 0.70  LABCREA -- -- --  CREAT24HRUR -- -- --  CALCIUM 8.9 8.9 10.1  MG -- -- --  PHOS -- -- --  PROT 7.8 -- --  ALBUMIN 3.1* -- --  AST 10 -- --  ALT 11 -- --  ALKPHOS 71 -- --  BILITOT 0.3 -- --  BILIDIR -- -- --  IBILI -- -- --  PREALBUMIN 13.1* -- --  TRIG -- -- --  CHOLHDL -- -- --  CHOL -- -- --   Estimated Creatinine Clearance: 89.4 ml/min (by C-G formula based on Cr of 0.54).   No results found for this basename: GLUCAP:3 in the last 72 hours  Medical History: Past Medical History  Diagnosis Date  . Hypertension     no meds  . Cancer     rectal   . 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 antiseptic oral rinse  15 mL Mouth Rinse q12n4p  . cefTRIAXone (ROCEPHIN)  IV  1 g Intravenous Q24H  . chlorhexidine  15 mL Mouth Rinse BID  . enoxaparin  40 mg Subcutaneous Q24H  . metoprolol  5 mg Intravenous Q6H  .  pantoprazole (PROTONIX) IV  40 mg Intravenous QHS   Infusions:    . dextrose 5 % and 0.9 % NaCl with KCl 20 mEq/L 125 mL/hr at 11/09/11 1220    Nutritional Goals:   RD recs 9/23 :   Kcal 1700-1900, Protein 80-90 gm  Clinimix 5/20 at goal rate of 70 ml/hr + Lipids MWF to provide: 84 g/day protein and avg. 1684 Kcal/day weekly (1958 Kcal/day MWF, 1478 Kcal/day STTHS).  Current nutrition:   Diet: NPO  IVF: D5 NS K20 at 125 ml/hr  CBGs & Insulin requirements past 24 hours:   None ordered  Assessment:   67 YOM with small bowel obstruction, metastatic rectal cancer, rectal leak, abscess, s/p diverting colostomy.  Renal/hepatic function: wnl  Electrolytes: wnl  Pre-Albumin: 13.1 (9/24)  TG/Cholesterol: pending  GI prophylaxis:  Pantoprazole 40mg  IV QHS  Plan:  At 1800 tonight Clinimix E 5/20 at 40 ml/hr Fat emulsion at 10 ml/hr (MWF only due to ongoing shortage). TNA to contain standard multivitamins and trace elements (MWF only due to ongoing shortage). Reduce IVF to 85 ml/hr. Add CBGs Q8h; Add SSI if needed.  TNA lab panels on Mondays &  Thursdays. F/u daily.  Lynann Beaver PharmD, BCPS Pager [phone] 11/09/2011 3:55 PM

## 2011-11-09 NOTE — Progress Notes (Signed)
Subjective: Nothing  In ostomy bag. Changed early this AM, not even gas right now.  Objective: Vital signs in last 24 hours: Temp:  [98 F (36.7 C)-98.6 F (37 C)] 98.6 F (37 C) (09/24 4098) Pulse Rate:  [95-103] 103  (09/24 1222) Resp:  [16-20] 16  (09/24 0608) BP: (135-182)/(79-92) 135/79 mmHg (09/24 1222) SpO2:  [98 %-100 %] 100 % (09/24 0608) Last BM Date: 11/07/11  550 from NG yesterday, Drain 40 ml, NPO, afebrile, BP is up, CMP is OK  Intake/Output from previous day: 09/23 0701 - 09/24 0700 In: 3786.3 [I.V.:2956.3; NG/GT:30; IV Piggyback:50] Out: 1040 [Urine:450; Emesis/NG output:550; Drains:40] Intake/Output this shift: Total I/O In: 412.5 [I.V.:412.5] Out: -   General appearance: alert, cooperative and no distress GI: distended, BS hypoactive, not really tender.  Lab Results:   Basename 11/08/11 0500 11/07/11 1700  WBC 7.5 13.4*  HGB 11.6* 13.4  HCT 36.0* 40.5  PLT 329 433*    BMET  Basename 11/09/11 0510 11/08/11 0500  NA 136 140  K 3.8 3.8  CL 101 102  CO2 27 30  GLUCOSE 137* 135*  BUN 9 18  CREATININE 0.54 0.69  CALCIUM 8.9 8.9   PT/INR No results found for this basename: LABPROT:2,INR:2 in the last 72 hours   Lab 11/09/11 0510  AST 10  ALT 11  ALKPHOS 71  BILITOT 0.3  PROT 7.8  ALBUMIN 3.1*     Lipase     Component Value Date/Time   LIPASE 38 11/07/2011 1700     Studies/Results: Dg Abd 1 View  11/09/2011  *RADIOLOGY REPORT*  Clinical Data: Small bowel obstruction.  Small bowel dilation.  ABDOMEN - 1 VIEW  Comparison: 11/08/2011.  Findings: Nasogastric tube is present in the proximal stomach. There is a persistent dilated loop of small bowel in the central abdomen which measures 8.3 cm, similar yesterday's exam.  There is now a second dilated loop central to the larger dilated loop. Pigtail drainage catheter is present in the right lower quadrant. Staples are present in the region of the rectosigmoid.  IMPRESSION:  1. Persistent   small bowel obstruction.  Maximal diameter is 84 mm. 2.  Nasogastric tube in the proximal stomach. 3.  Pigtail drainage catheter in the pelvis is unchanged.   Original Report Authenticated By: Andreas Newport, M.D.    Dg Abd 1 View  11/08/2011  *RADIOLOGY REPORT*  Clinical Data: Evaluate NG tube placement  ABDOMEN - 1 VIEW  Comparison: 11/07/2011  Findings: No enteric tube is seen.  Dilated loop of small bowel in the mid abdomen, unchanged, compatible with small bowel obstruction.  Pigtail drainage catheter terminating in the right pelvis.  2.2 cm nodule in the right mid/lower lung, suspicious for metastasis, grossly unchanged.  IMPRESSION: No enteric tube is seen.  Stable small bowel obstruction.   Original Report Authenticated By: Charline Bills, M.D.    Dg Abd Acute W/chest  11/07/2011  *RADIOLOGY REPORT*  Clinical Data: Abdominal pain, distention, nausea and vomiting  ACUTE ABDOMEN SERIES (ABDOMEN 2 VIEW & CHEST 1 VIEW)  Comparison: 10/18/2011  Findings: Cardiomediastinal silhouette is unremarkable.  Right subclavian Port-A-Cath with tip in the distal SVC.  Multiple right lung nodules are noted consistent with metastatic disease.  Largest right perihilar region measures 1.9 cm.  Smaller nodules are noted left lower lobe suspicious for metastatic disease.  Marked small bowel distention with air-fluid levels left and mid abdomen highly suspicious for small bowel obstruction.  A drainage catheter is noted in the  right lower quadrant. No free abdominal air.  IMPRESSION: Multiple right lung nodules are noted consistent with metastatic disease.  Largest right perihilar region measures 1.9 cm.  Smaller nodules are noted left lower lobe suspicious for metastatic disease.  Marked small bowel distention with air-fluid levels left and mid abdomen highly suspicious for small bowel obstruction.  A drainage catheter is noted in the right lower quadrant. No free abdominal air.   Original Report Authenticated By: Natasha Mead, M.D.     Medications:    . antiseptic oral rinse  15 mL Mouth Rinse q12n4p  . cefTRIAXone (ROCEPHIN)  IV  1 g Intravenous Q24H  . chlorhexidine  15 mL Mouth Rinse BID  . enoxaparin  40 mg Subcutaneous Q24H  . metoprolol  5 mg Intravenous Q6H  . pantoprazole (PROTONIX) IV  40 mg Intravenous QHS    Assessment/Plan . 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  2. Rectal leak secondary to recurrent cancer or anastomotic dehiscence; S/P. laparoscopic diverting loop descending colostomy, UA, rigid proctoscopy, biopsies. Biopsies have not shown reoccurrence of the cancer.  3. Possible small bowel obstruction, nonfunctional ostomy since discharge 10/10/11.  4. Percutaneous transgluteal abscess drain of intra abdominal abscess. 5. Recurrent obstruction 11/07/11 6. UTI  Plan:  He's been NPO with nothing thru ostomy for about 4 days.  Will discuss with Dr. [name].       LOS: 2 days    Jeffery [name] 11/09/2011

## 2011-11-09 NOTE — Consult Note (Signed)
WOC ostomy follow-up: Patient with no ostomy output s/p colostomy irrigation.  Per RN, Dr. [name] does not wish to have irrigation repeated today.  RN instructed to package materials used for colostomy irrigation together in the box in his room so that in the event another irrigation is desired, he will have all that he needs. I will not follow.  Please re-consult if needed. Thanks, Ladona Mow, MSN, RN, [facility], CWOCN [phone])

## 2011-11-09 NOTE — Progress Notes (Signed)
Patient has abscess drain to rt buttocks area.  Patient states, he has been flushing drains at home.  Requesting to flush drain.  No order for drain flushes.  Paged Dr Gerrit Friends.  Notified Dr Gerrit Friends.  Order given for drain flushes 10cc BID.  Plan of care discussed with patient and patient flushed drain per md order with RN observing.

## 2011-11-09 NOTE — Progress Notes (Signed)
Pt will likely need operation unless something changes in next 24 hours.    NGT putting out bilious material, but bowel dilation not improving.  Last admit, pt put out flatus pretty rapidly out after irrigations started.

## 2011-11-10 ENCOUNTER — Encounter (HOSPITAL_COMMUNITY)
Admission: EM | Disposition: A | Discharge status: Home Health | Payer: Self-pay | Source: Home / Self Care | Attending: General Surgery

## 2011-11-10 ENCOUNTER — Encounter (HOSPITAL_COMMUNITY): Payer: Self-pay | Admitting: Anesthesiology

## 2011-11-10 ENCOUNTER — Inpatient Hospital Stay (HOSPITAL_COMMUNITY): Payer: Medicaid Other | Admitting: Anesthesiology

## 2011-11-10 ENCOUNTER — Inpatient Hospital Stay (HOSPITAL_COMMUNITY): Payer: Medicaid Other

## 2011-11-10 DIAGNOSIS — K565 Intestinal adhesions [bands], unspecified as to partial versus complete obstruction: Secondary | ICD-10-CM

## 2011-11-10 DIAGNOSIS — C784 Secondary malignant neoplasm of small intestine: Secondary | ICD-10-CM

## 2011-11-10 DIAGNOSIS — J95821 Acute postprocedural respiratory failure: Secondary | ICD-10-CM

## 2011-11-10 DIAGNOSIS — J690 Pneumonitis due to inhalation of food and vomit: Secondary | ICD-10-CM | POA: Diagnosis not present

## 2011-11-10 DIAGNOSIS — I959 Hypotension, unspecified: Secondary | ICD-10-CM

## 2011-11-10 DIAGNOSIS — K659 Peritonitis, unspecified: Secondary | ICD-10-CM | POA: Diagnosis present

## 2011-11-10 HISTORY — PX: LAPAROTOMY: SHX154

## 2011-11-10 HISTORY — PX: APPLICATION OF WOUND VAC: SHX5189

## 2011-11-10 LAB — COMPREHENSIVE METABOLIC PANEL
Alkaline Phosphatase: 68 U/L (ref 39–117)
Alkaline Phosphatase: 38 U/L — ABNORMAL LOW (ref 39–117)
BUN: 10 mg/dL (ref 6–23)
BUN: 18 mg/dL (ref 6–23)
CO2: 19 mEq/L (ref 19–32)
Calcium: 9.2 mg/dL (ref 8.4–10.5)
Chloride: 111 mEq/L (ref 96–112)
Creatinine, Ser: 1.39 mg/dL — ABNORMAL HIGH (ref 0.50–1.35)
GFR calc Af Amer: >90 mL/min (ref >90)
GFR calc non Af Amer: >90 mL/min (ref >90)
GFR calc non Af Amer: 57 mL/min — ABNORMAL LOW (ref >90)
Glucose, Bld: 144 mg/dL — ABNORMAL HIGH (ref 70–99)
Glucose, Bld: 171 mg/dL — ABNORMAL HIGH (ref 70–99)
Potassium: 3.2 mEq/L — ABNORMAL LOW (ref 3.5–5.1)
Total Bilirubin: 1.2 mg/dL (ref 0.3–1.2)
Total Protein: 7.8 g/dL (ref 6.0–8.3)

## 2011-11-10 LAB — BLOOD GAS, ARTERIAL
Acid-base deficit: 8.5 mmol/L — ABNORMAL HIGH (ref 0.0–2.0)
Bicarbonate: 18.5 mEq/L — ABNORMAL LOW (ref 20.0–24.0)
Bicarbonate: 16.8 mEq/L — ABNORMAL LOW (ref 20.0–24.0)
FIO2: 0.4 %
O2 Saturation: 96.6 %
PEEP: 5 cmH2O
Patient temperature: 98.6
Patient temperature: 98.6
TCO2: 17.3 mmol/L (ref 0–100)
TCO2: 15.5 mmol/L (ref 0–100)
pH, Arterial: 7.219 — ABNORMAL LOW (ref 7.350–7.450)
pO2, Arterial: 70.7 mmHg — ABNORMAL LOW (ref 80.0–100.0)
pO2, Arterial: 97.6 mmHg (ref 80.0–100.0)

## 2011-11-10 LAB — CBC WITH DIFFERENTIAL/PLATELET
Basophils Absolute: 0 10*3/uL (ref 0.0–0.1)
Eosinophils Relative: 0 % (ref 0–5)
Lymphs Abs: 0.2 10*3/uL — ABNORMAL LOW (ref 0.7–4.0)
Monocytes Absolute: 0.3 10*3/uL (ref 0.1–1.0)
Monocytes Relative: 16 % — ABNORMAL HIGH (ref 3–12)
Neutrophils Relative %: 71 % (ref 43–77)
Platelets: 252 10*3/uL (ref 150–400)
RBC: 4.19 MIL/uL — ABNORMAL LOW (ref 4.22–5.81)
RDW: 14.3 % (ref 11.5–15.5)
WBC: 1.8 10*3/uL — ABNORMAL LOW (ref 4.0–10.5)

## 2011-11-10 LAB — LACTIC ACID, PLASMA: Lactic Acid, Venous: 3 mmol/L — ABNORMAL HIGH (ref 0.5–2.2)

## 2011-11-10 LAB — CBC
HCT: 36.4 % — ABNORMAL LOW (ref 39.0–52.0)
Hemoglobin: 11.7 g/dL — ABNORMAL LOW (ref 13.0–17.0)
RBC: 3.99 MIL/uL — ABNORMAL LOW (ref 4.22–5.81)
WBC: 5.6 10*3/uL (ref 4.0–10.5)

## 2011-11-10 LAB — PHOSPHORUS: Phosphorus: 3.2 mg/dL (ref 2.3–4.6)

## 2011-11-10 LAB — CHOLESTEROL, TOTAL: Cholesterol: 106 mg/dL (ref 0–200)

## 2011-11-10 LAB — GLUCOSE, CAPILLARY
Glucose-Capillary: 138 mg/dL — ABNORMAL HIGH (ref 70–99)
Glucose-Capillary: 146 mg/dL — ABNORMAL HIGH (ref 70–99)
Glucose-Capillary: 153 mg/dL — ABNORMAL HIGH (ref 70–99)
Glucose-Capillary: 225 mg/dL — ABNORMAL HIGH (ref 70–99)
Glucose-Capillary: 294 mg/dL — ABNORMAL HIGH (ref 70–99)

## 2011-11-10 LAB — DIFFERENTIAL
Lymphocytes Relative: 12 % (ref 12–46)
Lymphs Abs: 0.7 10*3/uL (ref 0.7–4.0)
Monocytes Absolute: 0.8 10*3/uL (ref 0.1–1.0)
Monocytes Relative: 14 % — ABNORMAL HIGH (ref 3–12)
Neutro Abs: 4.1 10*3/uL (ref 1.7–7.7)

## 2011-11-10 LAB — PREALBUMIN: Prealbumin: 11.8 mg/dL — ABNORMAL LOW (ref 17.0–34.0)

## 2011-11-10 LAB — TRIGLYCERIDES: Triglycerides: 48 mg/dL (ref <150)

## 2011-11-10 SURGERY — LYSIS, ADHESIONS, LAPAROSCOPIC
Anesthesia: General | Site: Abdomen | Wound class: Dirty or Infected

## 2011-11-10 MED ORDER — PHENYLEPHRINE HCL 10 MG/ML IJ SOLN
10.0000 mg | INTRAVENOUS | Status: DC | PRN
  Administered 2011-11-10: 20 ug/min via INTRAVENOUS

## 2011-11-10 MED ORDER — FLUCONAZOLE IN SODIUM CHLORIDE 200-0.9 MG/100ML-% IV SOLN
200.0000 mg | INTRAVENOUS | Status: DC
  Administered 2011-11-10: 200 mg via INTRAVENOUS
  Filled 2011-11-10: qty 100

## 2011-11-10 MED ORDER — SODIUM CHLORIDE 0.9 % IV SOLN
100.0000 mg | Freq: Every day | INTRAVENOUS | Status: AC
  Administered 2011-11-10: 100 mg via INTRAVENOUS
  Filled 2011-11-10: qty 100

## 2011-11-10 MED ORDER — MIDAZOLAM BOLUS VIA INFUSION
1.0000 mg | INTRAVENOUS | Status: DC | PRN
  Administered 2011-11-12: 2 mg via INTRAVENOUS
  Filled 2011-11-10: qty 2

## 2011-11-10 MED ORDER — PROMETHAZINE HCL 25 MG/ML IJ SOLN: 6.2500 mg | INTRAMUSCULAR | Status: DC | PRN

## 2011-11-10 MED ORDER — INSULIN ASPART 100 UNIT/ML ~~LOC~~ SOLN: 0.0000 [IU] | Freq: Every day | SUBCUTANEOUS | Status: DC

## 2011-11-10 MED ORDER — SODIUM CHLORIDE 0.9 % IV BOLUS (SEPSIS)
1000.0000 mL | INTRAVENOUS | Status: DC | PRN
  Administered 2011-11-10 – 2011-11-12 (×3): 1000 mL via INTRAVENOUS

## 2011-11-10 MED ORDER — ONDANSETRON HCL 4 MG/2ML IJ SOLN
INTRAMUSCULAR | Status: DC | PRN
  Administered 2011-11-10: 4 mg via INTRAVENOUS

## 2011-11-10 MED ORDER — POTASSIUM CHLORIDE IN NACL 20-0.9 MEQ/L-% IV SOLN
INTRAVENOUS | Status: DC
  Filled 2011-11-10: qty 1000

## 2011-11-10 MED ORDER — SODIUM CHLORIDE 0.9 % IV SOLN
50.0000 ug/h | INTRAVENOUS | Status: DC
  Administered 2011-11-10: 50 ug/h via INTRAVENOUS
  Administered 2011-11-11: 100 ug/h via INTRAVENOUS
  Administered 2011-11-11: 50 ug/h via INTRAVENOUS
  Administered 2011-11-12 – 2011-11-19 (×2): 75 ug/h via INTRAVENOUS
  Administered 2011-11-19: 100 ug/h via INTRAVENOUS
  Administered 2011-11-19: via INTRAVENOUS
  Filled 2011-11-10 (×3): qty 50

## 2011-11-10 MED ORDER — IPRATROPIUM BROMIDE HFA 17 MCG/ACT IN AERS
8.0000 | INHALATION_SPRAY | Freq: Four times a day (QID) | RESPIRATORY_TRACT | Status: DC
  Administered 2011-11-10 – 2011-11-12 (×7): 8 via RESPIRATORY_TRACT
  Filled 2011-11-10: qty 12.9

## 2011-11-10 MED ORDER — ALBUTEROL SULFATE HFA 108 (90 BASE) MCG/ACT IN AERS
8.0000 | INHALATION_SPRAY | Freq: Four times a day (QID) | RESPIRATORY_TRACT | Status: DC
  Administered 2011-11-10 – 2011-11-12 (×7): 8 via RESPIRATORY_TRACT
  Filled 2011-11-10: qty 6.7

## 2011-11-10 MED ORDER — VANCOMYCIN HCL 500 MG IV SOLR
500.0000 mg | Freq: Three times a day (TID) | INTRAVENOUS | Status: DC
  Administered 2011-11-10 – 2011-11-11 (×3): 500 mg via INTRAVENOUS
  Filled 2011-11-10 (×4): qty 500

## 2011-11-10 MED ORDER — PROPOFOL 10 MG/ML IV BOLUS
INTRAVENOUS | Status: DC | PRN
  Administered 2011-11-10: 160 mg via INTRAVENOUS

## 2011-11-10 MED ORDER — HYDROMORPHONE HCL PF 1 MG/ML IJ SOLN
0.2500 mg | INTRAMUSCULAR | Status: DC | PRN
  Administered 2011-11-10 (×3): 0.5 mg via INTRAVENOUS

## 2011-11-10 MED ORDER — LIDOCAINE HCL 1 % IJ SOLN
INTRAMUSCULAR | Status: DC | PRN
  Administered 2011-11-10: 20 mL

## 2011-11-10 MED ORDER — SUCCINYLCHOLINE CHLORIDE 20 MG/ML IJ SOLN
INTRAMUSCULAR | Status: DC | PRN
  Administered 2011-11-10: 100 mg via INTRAVENOUS

## 2011-11-10 MED ORDER — MIDAZOLAM HCL 5 MG/5ML IJ SOLN
INTRAMUSCULAR | Status: DC | PRN
  Administered 2011-11-10 (×2): 2 mg via INTRAVENOUS

## 2011-11-10 MED ORDER — SODIUM CHLORIDE 0.9 % IV SOLN
250.0000 mg | Freq: Four times a day (QID) | INTRAVENOUS | Status: DC
  Administered 2011-11-10 – 2011-11-12 (×7): 250 mg via INTRAVENOUS
  Filled 2011-11-10 (×8): qty 250

## 2011-11-10 MED ORDER — LACTATED RINGERS IV SOLN
INTRAVENOUS | Status: DC | PRN
  Administered 2011-11-10 (×2): via INTRAVENOUS

## 2011-11-10 MED ORDER — NEOSTIGMINE METHYLSULFATE 1 MG/ML IJ SOLN
INTRAMUSCULAR | Status: DC | PRN
  Administered 2011-11-10: 4 mg via INTRAVENOUS

## 2011-11-10 MED ORDER — SODIUM CHLORIDE 0.9 % IV SOLN
INTRAVENOUS | Status: DC
  Administered 2011-11-10 – 2011-11-11 (×2): 150 mL/h via INTRAVENOUS

## 2011-11-10 MED ORDER — DEXTROSE 5 % IV SOLN
2.0000 g | INTRAVENOUS | Status: AC
  Administered 2011-11-10: 2 g via INTRAVENOUS
  Filled 2011-11-10: qty 2

## 2011-11-10 MED ORDER — LACTATED RINGERS IV SOLN
INTRAVENOUS | Status: DC
  Administered 2011-11-10: 1000 via INTRAVENOUS

## 2011-11-10 MED ORDER — FAT EMULSION 20 % IV EMUL
240.0000 mL | INTRAVENOUS | Status: AC
  Administered 2011-11-10: 240 mL via INTRAVENOUS
  Filled 2011-11-10: qty 250

## 2011-11-10 MED ORDER — HYDRALAZINE HCL 20 MG/ML IJ SOLN: 10.0000 mg | INTRAMUSCULAR | Status: DC | PRN

## 2011-11-10 MED ORDER — PHENYLEPHRINE HCL 10 MG/ML IJ SOLN
INTRAMUSCULAR | Status: DC | PRN
  Administered 2011-11-10: 160 ug via INTRAVENOUS
  Administered 2011-11-10: 80 ug via INTRAVENOUS
  Administered 2011-11-10: 160 ug via INTRAVENOUS
  Administered 2011-11-10 (×2): 120 ug via INTRAVENOUS

## 2011-11-10 MED ORDER — SODIUM CHLORIDE 0.9 % IV BOLUS (SEPSIS)
1000.0000 mL | Freq: Once | INTRAVENOUS | Status: AC
  Administered 2011-11-10: 1000 mL via INTRAVENOUS

## 2011-11-10 MED ORDER — SODIUM CHLORIDE 0.9 % IV SOLN
2.0000 mg/h | INTRAVENOUS | Status: DC
  Administered 2011-11-10: 1 mg/h via INTRAVENOUS
  Administered 2011-11-11 – 2011-11-14 (×6): 2 mg/h via INTRAVENOUS
  Administered 2011-11-15: 1 mg/h via INTRAVENOUS
  Administered 2011-11-17 – 2011-11-19 (×3): 2 mg/h via INTRAVENOUS
  Administered 2011-11-19: via INTRAVENOUS
  Administered 2011-11-19: 2 mg/h via INTRAVENOUS
  Filled 2011-11-10 (×10): qty 10

## 2011-11-10 MED ORDER — FENTANYL BOLUS VIA INFUSION
50.0000 ug | Freq: Four times a day (QID) | INTRAVENOUS | Status: DC | PRN
  Administered 2011-11-12: 50 ug via INTRAVENOUS
  Administered 2011-11-19: 75 ug via INTRAVENOUS
  Filled 2011-11-10: qty 100

## 2011-11-10 MED ORDER — FENTANYL CITRATE 0.05 MG/ML IJ SOLN
INTRAMUSCULAR | Status: DC | PRN
  Administered 2011-11-10: 100 ug via INTRAVENOUS
  Administered 2011-11-10: 50 ug via INTRAVENOUS
  Administered 2011-11-10: 100 ug via INTRAVENOUS
  Administered 2011-11-10 (×2): 50 ug via INTRAVENOUS

## 2011-11-10 MED ORDER — METOPROLOL TARTRATE 1 MG/ML IV SOLN
2.5000 mg | INTRAVENOUS | Status: DC | PRN
  Administered 2011-11-11 (×2): 5 mg via INTRAVENOUS
  Administered 2011-11-11 (×2): 2.5 mg via INTRAVENOUS
  Administered 2011-11-12 – 2011-11-14 (×5): 5 mg via INTRAVENOUS
  Filled 2011-11-10 (×8): qty 5

## 2011-11-10 MED ORDER — SODIUM CHLORIDE 0.9 % IV SOLN
100.0000 mg | INTRAVENOUS | Status: AC
  Administered 2011-11-11 – 2011-11-29 (×19): 100 mg via INTRAVENOUS
  Filled 2011-11-10 (×25): qty 100

## 2011-11-10 MED ORDER — ALBUTEROL SULFATE HFA 108 (90 BASE) MCG/ACT IN AERS: 8.0000 | INHALATION_SPRAY | RESPIRATORY_TRACT | Status: DC | PRN

## 2011-11-10 MED ORDER — INSULIN ASPART 100 UNIT/ML ~~LOC~~ SOLN
0.0000 [IU] | SUBCUTANEOUS | Status: DC
  Administered 2011-11-10: 2 [IU] via SUBCUTANEOUS
  Administered 2011-11-10: 5 [IU] via SUBCUTANEOUS
  Administered 2011-11-11 – 2011-11-17 (×19): 2 [IU] via SUBCUTANEOUS
  Administered 2011-11-17: 3 [IU] via SUBCUTANEOUS
  Administered 2011-11-17 – 2011-11-22 (×11): 2 [IU] via SUBCUTANEOUS
  Administered 2011-11-23: 3 [IU] via SUBCUTANEOUS
  Administered 2011-11-23 – 2011-11-26 (×12): 2 [IU] via SUBCUTANEOUS
  Administered 2011-11-27: 5 [IU] via SUBCUTANEOUS
  Administered 2011-11-27: 2 [IU] via SUBCUTANEOUS
  Administered 2011-11-27: 3 [IU] via SUBCUTANEOUS
  Administered 2011-11-28 (×2): 2 [IU] via SUBCUTANEOUS
  Administered 2011-11-28 – 2011-11-29 (×2): 3 [IU] via SUBCUTANEOUS
  Administered 2011-11-29 – 2011-11-30 (×2): 2 [IU] via SUBCUTANEOUS
  Administered 2011-11-30: 3 [IU] via SUBCUTANEOUS
  Administered 2011-12-01 – 2011-12-02 (×2): 2 [IU] via SUBCUTANEOUS

## 2011-11-10 MED ORDER — MEPERIDINE HCL 50 MG/ML IJ SOLN: 6.2500 mg | INTRAMUSCULAR | Status: DC | PRN

## 2011-11-10 MED ORDER — CISATRACURIUM BESYLATE (PF) 10 MG/5ML IV SOLN
INTRAVENOUS | Status: DC | PRN
  Administered 2011-11-10: 4 mg via INTRAVENOUS
  Administered 2011-11-10: 3 mg via INTRAVENOUS
  Administered 2011-11-10: 10 mg via INTRAVENOUS

## 2011-11-10 MED ORDER — DEXTROSE 5 % IV SOLN: 1.0000 g | Freq: Three times a day (TID) | INTRAVENOUS | Status: DC

## 2011-11-10 MED ORDER — INSULIN ASPART 100 UNIT/ML ~~LOC~~ SOLN
0.0000 [IU] | Freq: Three times a day (TID) | SUBCUTANEOUS | Status: DC
  Administered 2011-11-10: 1 [IU] via SUBCUTANEOUS
  Administered 2011-11-10: 7 [IU] via SUBCUTANEOUS

## 2011-11-10 MED ORDER — M.V.I. ADULT IV INJ
INJECTION | INTRAVENOUS | Status: DC
  Administered 2011-11-10: 18:00:00 via INTRAVENOUS
  Filled 2011-11-10: qty 2000

## 2011-11-10 MED ORDER — 0.9 % SODIUM CHLORIDE (POUR BTL) OPTIME
TOPICAL | Status: DC | PRN
  Administered 2011-11-10: 14000 mL

## 2011-11-10 MED ORDER — KCL IN DEXTROSE-NACL 20-5-0.9 MEQ/L-%-% IV SOLN
INTRAVENOUS | Status: DC
  Administered 2011-11-10: 17:00:00 via INTRAVENOUS
  Filled 2011-11-10 (×3): qty 1000

## 2011-11-10 MED ORDER — BUPIVACAINE HCL 0.25 % IJ SOLN
INTRAMUSCULAR | Status: DC | PRN
  Administered 2011-11-10: 50 mL

## 2011-11-10 MED ORDER — PIPERACILLIN-TAZOBACTAM 3.375 G IVPB
3.3750 g | Freq: Three times a day (TID) | INTRAVENOUS | Status: DC
  Administered 2011-11-10: 3.375 g via INTRAVENOUS
  Filled 2011-11-10 (×2): qty 50

## 2011-11-10 SURGICAL SUPPLY — 42 items
CABLE HI FREQUENCY MONOPOLAR (ELECTROSURGICAL) ×3 IMPLANT
CLAMP POUCH DRAINAGE QUIET (OSTOMY) ×3 IMPLANT
COVER MAYO STAND STRL (DRAPES) ×3 IMPLANT
DRAPE CAMERA CLOSED 9X96 (DRAPES) ×3 IMPLANT
DRAPE LG THREE QUARTER DISP (DRAPES) ×3 IMPLANT
DRAPE UTILITY XL STRL (DRAPES) ×3 IMPLANT
DRAPE WARM FLUID 44X44 (DRAPE) ×3 IMPLANT
DRSG VAC ATS SM SENSATRAC (GAUZE/BANDAGES/DRESSINGS) ×6 IMPLANT
ELECT CAUTERY BLADE 6.4 (BLADE) ×3 IMPLANT
GOWN STRL REIN XL XLG (GOWN DISPOSABLE) ×18 IMPLANT
KIT BASIN OR (CUSTOM PROCEDURE TRAY) ×3 IMPLANT
LIGASURE IMPACT 36 18CM CVD LR (INSTRUMENTS) ×3 IMPLANT
MANIFOLD NEPTUNE II (INSTRUMENTS) ×3 IMPLANT
PENCIL BUTTON HOLSTER BLD 10FT (ELECTRODE) ×3 IMPLANT
POUCH OSTOMY 1 3/4  H (OSTOMY) ×3 IMPLANT
RELOAD AUTO 90-3.5 TA90 BLE (ENDOMECHANICALS) ×3 IMPLANT
RELOAD PROXIMATE 75MM BLUE (ENDOMECHANICALS) ×15 IMPLANT
SOLUTION ANTI FOG 6CC (MISCELLANEOUS) ×3 IMPLANT
SPONGE DRAIN TRACH 4X4 STRL 2S (GAUZE/BANDAGES/DRESSINGS) ×6 IMPLANT
SPONGE LAP 18X18 X RAY DECT (DISPOSABLE) ×12 IMPLANT
STAPLER 90 3.5 STAND SLIM (STAPLE) ×3
STAPLER 90 3.5 STD SLIM (STAPLE) ×2 IMPLANT
STAPLER PROXIMATE 75MM BLUE (STAPLE) ×3 IMPLANT
STAPLER VISISTAT 35W (STAPLE) ×3 IMPLANT
SUCTION POOLE TIP (SUCTIONS) ×6 IMPLANT
SUT PDS AB 1 TP1 96 (SUTURE) ×6 IMPLANT
SUT SILK 2 0 (SUTURE) ×1
SUT SILK 2 0 SH CR/8 (SUTURE) ×3 IMPLANT
SUT SILK 2-0 18XBRD TIE 12 (SUTURE) ×2 IMPLANT
SUT SILK 3 0 (SUTURE) ×1
SUT SILK 3 0 SH CR/8 (SUTURE) ×3 IMPLANT
SUT SILK 3-0 18XBRD TIE 12 (SUTURE) ×2 IMPLANT
SUT VIC AB 3-0 SH 18 (SUTURE) ×3 IMPLANT
SUT VIC AB 3-0 SH 27 (SUTURE) ×2
SUT VIC AB 3-0 SH 27XBRD (SUTURE) ×4 IMPLANT
SYR BULB IRRIGATION 50ML (SYRINGE) ×3 IMPLANT
TOWEL OR 17X26 10 PK STRL BLUE (TOWEL DISPOSABLE) ×9 IMPLANT
TOWEL OR NON WOVEN STRL DISP B (DISPOSABLE) ×3 IMPLANT
TRAY LAP CHOLE (CUSTOM PROCEDURE TRAY) ×3 IMPLANT
TROCAR BLADELESS OPT 5 75 (ENDOMECHANICALS) ×3 IMPLANT
YANKAUER SUCT BULB TIP 10FT TU (MISCELLANEOUS) ×3 IMPLANT
YANKAUER SUCT BULB TIP NO VENT (SUCTIONS) ×6 IMPLANT

## 2011-11-10 NOTE — Addendum Note (Signed)
Addendum  created 11/10/11 1509 by Phillips Grout, MD   Modules edited:Anesthesia LDA, Inpatient Notes

## 2011-11-10 NOTE — Progress Notes (Signed)
Pt extubated in PACU. Delayed extubation for worry of aspiration. Pt was awake and following commands at time of extubation.. 40 min later he is now tachypnic and tachycardic and o2 sats at 93% on FM.  Reintubated for airway protection.  Propofol 100mg  Sux 100mg   8.0 ETT   Grade 2 view Mac4.  Pt placed on ventilator. tranfer to ICU  Phillips Grout MD

## 2011-11-10 NOTE — Transfer of Care (Signed)
Immediate Anesthesia Transfer of Care Note  Patient: Jeffery [name]  Procedure(s) Performed: Procedure(s) (LRB) with comments: LAPAROSCOPIC LYSIS OF ADHESIONS (N/A) - laparoscopic versus open lysis of adhesions EXPLORATORY LAPAROTOMY (N/A) - SMALL BOWEL RESECTION x 2 APPLICATION OF WOUND VAC ()  Patient Location: PACU  Anesthesia Type: General  Level of Consciousness: sedated and Patient remains intubated per anesthesia plan  Airway & Oxygen Therapy: Patient Spontanous Breathing and Patient connected to T-piece oxygen  Post-op Assessment: Report given to PACU RN and Post -op Vital signs reviewed and stable  Post vital signs: Reviewed and stable  Complications: No apparent anesthesia complications

## 2011-11-10 NOTE — Anesthesia Postprocedure Evaluation (Signed)
  Anesthesia Post-op Note  Patient: Jeffery [name]  Procedure(s) Performed: Procedure(s) (LRB): LAPAROSCOPIC LYSIS OF ADHESIONS (N/A) EXPLORATORY LAPAROTOMY (N/A) APPLICATION OF WOUND VAC ()  Patient Location: PACU  Anesthesia Type: General  Level of Consciousness: awake and alert   Airway and Oxygen Therapy: Patient Spontanous Breathing  Post-op Pain: mild  Post-op Assessment: Post-op Vital signs reviewed, Patient's Cardiovascular Status Stable, Respiratory Function Stable, Patent Airway and No signs of Nausea or vomiting  Post-op Vital Signs: stable  Complications: No apparent anesthesia complications. Pt extubated in PACU when wide awake and following commands. Delayed extubation because of aspiration concerns. Doing well.

## 2011-11-10 NOTE — Progress Notes (Signed)
eLink Physician-Brief Progress Note Patient Name: Jeffery [name] DOB: 1960-06-23 MRN: [id number]  Date of Service  11/10/2011   HPI/Events of Note   Labs reviewed - lactate 2.5 lower, K 3.2 CVP 6 after 4L, UO 20/h, remains tachy @ 150/m  eICU Interventions  Will not replete K given low UO & rising cr Treat pain   Intervention Category Intermediate Interventions: Diagnostic test evaluation  ALVA,RAKESH V. 11/10/2011, 11:36 PM

## 2011-11-10 NOTE — Progress Notes (Signed)
eLink Physician-Brief Progress Note Patient Name: WRAY TELANDER DOB: 1960-06-11 MRN: [id number]  Date of Service  11/10/2011   HPI/Events of Note   abg reviewed  eICU Interventions  Not breathing over set rate, rate to 24, abg to follow   Intervention Category Major Interventions: Respiratory failure - evaluation and management  Cleburn Maiolo J. 11/10/2011, 6:18 PM

## 2011-11-10 NOTE — Progress Notes (Signed)
eLink Physician-Brief Progress Note Patient Name: Jeffery [name] DOB: 02/09/1961 MRN: [id number]  Date of Service  11/10/2011   HPI/Events of Note  Tachy 140 st  eICU Interventions  fent bolus, bolus 1 lliter cvp 8, assess occult septic sock lactic acid abg   Intervention Category Major Interventions: Shock - evaluation and management  Amiee Wiley J. 11/10/2011, 5:44 PM

## 2011-11-10 NOTE — Progress Notes (Signed)
eLink Physician-Brief Progress Note Patient Name: Jeffery [name] DOB: [birth year] MRN: [id number]  Date of Service  11/10/2011   HPI/Events of Note   Severe sepsis likely, lactic less 4,  Source asp, abdo sirs response, immune suppression  eICU Interventions  Change zosyn to imi, continue vanc Change difluc to caspo Stat labs, chem, cbc Volume on going   Intervention Category Major Interventions: Shock - evaluation and management;Sepsis - evaluation and management  Nelda Bucks. 11/10/2011, 7:43 PM

## 2011-11-10 NOTE — Progress Notes (Signed)
PARENTERAL NUTRITION CONSULT NOTE - INITIAL  Pharmacy Consult for TPN Indication: Bowel obstruction  No Known Allergies  Patient Measurements: Height: 5' 4.17" (163 cm) Weight: 128 lb 15.5 oz (58.5 kg) [name] : 59.6  Adjusted Body Weight:  Usual Weight: 142 lbs  Vital Signs: Temp: 98 F (36.7 C) (09/25 0613) Temp src: Oral (09/25 0613) BP: 151/90 mmHg (09/25 0613) Pulse Rate: 102  (09/25 0613)  Intake/Output from previous day: 09/24 0701 - 09/25 0700 In: 2002.2 [I.V.:1972.2; NG/GT:30] Out: 695 [Urine:400; Emesis/NG output:250; Drains:45]  Labs:  [facility] 11/10/11 0600 11/08/11 0500 11/07/11 1700  WBC 5.6 7.5 13.4*  HGB 11.7* 11.6* 13.4  HCT 36.4* 36.0* 40.5  PLT 341 329 433*  APTT -- -- --  INR -- -- --     Basename 11/09/11 0510 11/08/11 0500 11/07/11 1700  NA 136 140 136  K 3.8 3.8 3.5  CL 101 102 94*  CO2 27 30 30   GLUCOSE 137* 135* 131*  BUN 9 18 19   CREATININE 0.54 0.69 0.70  LABCREA -- -- --  CREAT24HRUR -- -- --  CALCIUM 8.9 8.9 10.1  MG -- -- --  PHOS -- -- --  PROT 7.8 -- --  ALBUMIN 3.1* -- --  AST 10 -- --  ALT 11 -- --  ALKPHOS 71 -- --  BILITOT 0.3 -- --  BILIDIR -- -- --  IBILI -- -- --  PREALBUMIN 13.1* -- --  TRIG -- -- --  CHOLHDL -- -- --  CHOL -- -- --   Estimated Creatinine Clearance: 89.4 ml/min (by C-G formula based on Cr of 0.54).    Basename 11/10/11 0537 11/10/11 0008 11/09/11 1712  GLUCAP 146* 153* 126*    Medical History: Past Medical History  Diagnosis Date  . Hypertension     no meds  . Cancer     rectal   . 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 antiseptic oral rinse  15 mL Mouth Rinse q12n4p  . cefTRIAXone (ROCEPHIN)  IV  1 g Intravenous Q24H  . chlorhexidine  15 mL Mouth Rinse BID  . enoxaparin  40 mg Subcutaneous Q24H  .  metoprolol  5 mg Intravenous Q6H  . pantoprazole (PROTONIX) IV  40 mg Intravenous QHS  . DISCONTD: enoxaparin  40 mg Subcutaneous Q24H   Infusions:     . dextrose 5 % and 0.9 % NaCl with KCl 20 mEq/L 125 mL/hr at 11/09/11 1220  . dextrose 5 % and 0.9 % NaCl with KCl 20 mEq/L 85 mL/hr at 11/09/11 2129  . TPN (CLINIMIX) +/- additives 40 mL/hr at 11/09/11 2141    Nutritional Goals:   RD recs 9/23 :   Kcal 1700-1900, Protein 80-90 gm  Clinimix 5/20 at goal rate of 70 ml/hr + Lipids MWF to provide: 84 g/day protein and avg. 1684 Kcal/day weekly (1958 Kcal/day MWF, 1478 Kcal/day STTHS).  Current nutrition:   Diet: NPO  IVF: D5 NS K20 at 85 ml/hr  CBGs & Insulin requirements past 24 hours:   None ordered  Assessment:   46 YOM with small bowel obstruction, metastatic rectal cancer, rectal leak, abscess, s/p diverting colostomy.  Renal/hepatic function: wnl   Electrolytes: wnl   Pre-Albumin: 13.1 (9/24)  TG/Cholesterol: pending  CBG's: 146, 153  Plan:  At 1800 tonight Advance Clinimix  E 5/20 to 55 ml/hr tonight at 1800 Fat emulsion at 10 ml/hr (MWF only due to ongoing shortage). TNA to contain standard multivitamins and trace elements (MWF only due to ongoing shortage). Reduce IVF to 70  ml/hr. CBGs Q8h; Add sensitive SSI TID and HS TNA lab panels on Mondays & Thursdays. F/u daily.   Marquasia Schmieder, Loma Messing PharmD Pager #: [phone] 7:21 AM 11/10/2011

## 2011-11-10 NOTE — Progress Notes (Signed)
Nutrition Follow-up  Intervention: TPN per pharmacy. Diet advancement per MD. Will monitor.   Diet Order: NPO  TPN: Clinimix E 5/20 @ 40 ml/hr.  Lipids (20% IVFE @ 10 ml/hr), multivitamins, and trace elements are provided 3 times weekly (MWF) due to national backorder.  Provides 1051 kcal and 48 grams protein daily (based on weekly average).  Meets 61% minimum estimated kcal and 53% minimum estimated protein needs.  Additional IVF with D5 NS @ 85 ml/hr.  - Pt scheduled to have surgery today for SBO obstruction. DG of abdomen yesterday showed persistent SBO. Pt with total NGT brown output yesterday. Pt with percutaneous transgluteal abscess drain of intra-abdominal abscess which drained 45ml total yesterday.   - PALB slightly down - CBGs slightly elevated   Meds: Scheduled Meds:   . antiseptic oral rinse  15 mL Mouth Rinse q12n4p  . cefOXitin  2 g Intravenous 60 min Pre-Op  . cefTRIAXone (ROCEPHIN)  IV  1 g Intravenous Q24H  . chlorhexidine  15 mL Mouth Rinse BID  . enoxaparin  40 mg Subcutaneous Q24H  . insulin aspart  0-5 Units Subcutaneous QHS  . insulin aspart  0-9 Units Subcutaneous TID WC  . metoprolol  5 mg Intravenous Q6H  . pantoprazole (PROTONIX) IV  40 mg Intravenous QHS  . DISCONTD: cefOXitin  1 g Intravenous Q8H  . DISCONTD: enoxaparin  40 mg Subcutaneous Q24H   Continuous Infusions:   . dextrose 5 % and 0.9 % NaCl with KCl 20 mEq/L 125 mL/hr at 11/09/11 1220  . dextrose 5 % and 0.9 % NaCl with KCl 20 mEq/L 85 mL/hr at 11/09/11 2129  . dextrose 5 % and 0.9 % NaCl with KCl 20 mEq/L    . fat emulsion    . TPN [facility]) +/- additives Stopped (11/10/11 0909)  . TPN (CLINIMIX) +/- additives     PRN Meds:.morphine injection, ondansetron, pneumococcal 23 valent vaccine, sodium chloride  Labs:  CMP     Component Value Date/Time   NA 135 11/10/2011 0600   K 4.8 11/10/2011 0600   CL 98 11/10/2011 0600   CO2 29 11/10/2011 0600   GLUCOSE 144* 11/10/2011 0600   BUN 10  11/10/2011 0600   CREATININE 0.57 11/10/2011 0600   CALCIUM 9.2 11/10/2011 0600   PROT 7.8 11/10/2011 0600   ALBUMIN 3.0* 11/10/2011 0600   AST 32 11/10/2011 0600   ALT 11 11/10/2011 0600   ALKPHOS 68 11/10/2011 0600   BILITOT 0.3 11/10/2011 0600   GFRNONAA >90 11/10/2011 0600   GFRAA >90 11/10/2011 0600   Prealbumin  Date Value Range Status  11/10/2011 11.8* 17.0 - 34.0 mg/dL Final   CBG (last 3)   Basename 11/10/11 0747 11/10/11 0537 11/10/11 0008  GLUCAP 138* 146* 153*      Intake/Output Summary (Last 24 hours) at 11/10/11 1113 Last data filed at 11/10/11 0823  Gross per 24 hour  Intake 1589.67 ml  Output    695 ml  Net 894.67 ml   Last BM - 9/22  Weight Status: No new weights   Re-estimated needs:   1700-1900 calories 90-100g protein   Nutrition Dx: Inadequate oral intake - ongoing   Goal: Diet advancement as appropriate - not met.   New goal: TPN to meet >90% of estimated nutritional needs.   Monitor: Weights, labs, intake, colostomy, TPN, diet advancement    Levon Hedger MS, RD, LDN [phone] Pager [phone] After Hours Pager

## 2011-11-10 NOTE — Progress Notes (Signed)
Changed rate from 24 to 28 per Dr Tyson Alias.

## 2011-11-10 NOTE — Progress Notes (Signed)
Patient ID: Jeffery [name], male   DOB: [birth year], 51 y.o.   MRN: [id number] Day of Surgery  Subjective: Still no progress...  Objective: Vital signs in last 24 hours: Temp:  [98 F (36.7 C)-98.4 F (36.9 C)] 98 F (36.7 C) (09/25 4098) Pulse Rate:  [100-110] 102  (09/25 0613) Resp:  [16-18] 18  (09/25 0613) BP: (135-153)/(79-103) 151/90 mmHg (09/25 0613) SpO2:  [100 %] 100 % (09/25 0613) Last BM Date: 11/07/11  550 from NG yesterday, Drain 40 ml, NPO, afebrile, BP is up, CMP is OK  Intake/Output from previous day: 09/24 0701 - 09/25 0700 In: 2002.2 [I.V.:1972.2; NG/GT:30] Out: 695 [Urine:400; Emesis/NG output:250; Drains:45] Intake/Output this shift:    General appearance: alert, cooperative and no distress GI: distended, BS hypoactive, not really tender.  Lab Results:   Basename 11/10/11 0600 11/08/11 0500  WBC 5.6 7.5  HGB 11.7* 11.6*  HCT 36.4* 36.0*  PLT 341 329    BMET  Basename 11/10/11 0600 11/09/11 0510  NA 135 136  K 4.8 3.8  CL 98 101  CO2 29 27  GLUCOSE 144* 137*  BUN 10 9  CREATININE 0.57 0.54  CALCIUM 9.2 8.9   PT/INR No results found for this basename: LABPROT:2,INR:2 in the last 72 hours   Lab 11/10/11 0600 11/09/11 0510  AST 32 10  ALT 11 11  ALKPHOS 68 71  BILITOT 0.3 0.3  PROT 7.8 7.8  ALBUMIN 3.0* 3.1*     Lipase     Component Value Date/Time   LIPASE 38 11/07/2011 1700     Studies/Results: Dg Abd 1 View  11/09/2011  *RADIOLOGY REPORT*  Clinical Data: Small bowel obstruction.  Small bowel dilation.  ABDOMEN - 1 VIEW  Comparison: 11/08/2011.  Findings: Nasogastric tube is present in the proximal stomach. There is a persistent dilated loop of small bowel in the central abdomen which measures 8.3 cm, similar yesterday's exam.  There is now a second dilated loop central to the larger dilated loop. Pigtail drainage catheter is present in the right lower quadrant. Staples are present in the region of the rectosigmoid.  IMPRESSION:  1.  Persistent  small bowel obstruction.  Maximal diameter is 84 mm. 2.  Nasogastric tube in the proximal stomach. 3.  Pigtail drainage catheter in the pelvis is unchanged.   Original Report Authenticated By: Andreas Newport, M.D.    Dg Abd 1 View  11/08/2011  *RADIOLOGY REPORT*  Clinical Data: Evaluate NG tube placement  ABDOMEN - 1 VIEW  Comparison: 11/07/2011  Findings: No enteric tube is seen.  Dilated loop of small bowel in the mid abdomen, unchanged, compatible with small bowel obstruction.  Pigtail drainage catheter terminating in the right pelvis.  2.2 cm nodule in the right mid/lower lung, suspicious for metastasis, grossly unchanged.  IMPRESSION: No enteric tube is seen.  Stable small bowel obstruction.   Original Report Authenticated By: Charline Bills, M.D.     Medications:    . antiseptic oral rinse  15 mL Mouth Rinse q12n4p  . cefOXitin  2 g Intravenous 60 min Pre-Op  . cefTRIAXone (ROCEPHIN)  IV  1 g Intravenous Q24H  . chlorhexidine  15 mL Mouth Rinse BID  . enoxaparin  40 mg Subcutaneous Q24H  . insulin aspart  0-5 Units Subcutaneous QHS  . insulin aspart  0-9 Units Subcutaneous TID WC  . metoprolol  5 mg Intravenous Q6H  . pantoprazole (PROTONIX) IV  40 mg Intravenous QHS  . DISCONTD: cefOXitin  1 g  Intravenous Q8H  . DISCONTD: enoxaparin  40 mg Subcutaneous Q24H    Assessment/Plan . 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  2. Rectal leak secondary to recurrent cancer or anastomotic dehiscence; S/P. laparoscopic diverting loop descending colostomy, UA, rigid proctoscopy, biopsies. Biopsies have not shown reoccurrence of the cancer.  3. Small bowel obstruction - to OR today.   4. Percutaneous transgluteal abscess drain of intra abdominal abscess. 5. Recurrent obstruction 11/07/11 6. UTI- on rocephin.           LOS: 3 days    [facility] 11/10/2011

## 2011-11-10 NOTE — Consult Note (Signed)
PULMONARY/CCM CONSULT NOTE  Requesting MD/Service: [place]er[place]/CCS Date of admission: 9/22 Date of consult: 9/25 Reason for consultation: post op VDRF  Pt Profile:  51 yobm with history of recurrent metastatic rectal cancer underwent exploratory laparotomy, small bowel resection x2, incidental appendectomy 9/25. Was thought to have aspirated in PACU post operatively with feculent emesis reported. Required reintubation in PACU. PCCM asked to assist with vent/CCM issues.  Lines, Tubes, etc: ETT 9/25 >>  RUE PICC 9/24 >>  R [place] Portacath (longstanding)    Microbiology: Urine 9/22 >> Klebs (pansens)  Antibiotics:  Vanc 9/25 >> Fluconazole 9/25 >>  Zosyn 9/25 >>    Studies/Events:   Consults:  CCS (primary)   Best Practice: DVT: LMWH SUP: PPI Nutrition: TPN Glycemic control: SSI  Sedation/analgesia: continuous sedation   HPI:  Jeffery [name] was diagnosed with rectal cancer in 2011. He was treated with neoadjuvant radiation and underwent a laparoscopic assisted low anterior resection with a diverting loop ileostomy on 01/14/2010. The final pathology revealed invasive adenocarcinoma extending into the perirectal connective tissue with negative resection margins. He began adjuvant CAPOX chemotherapy 03/11/2010 and completed a final cycle on 06/25/2010. In January 2013 a CT confirmed multiple nodules at the lung bases consistent with metastatic disease. A biopsy on 04/20/2011 revealed metastatic adenocarcinoma consistent with a colorectal primary. He has been treated with FOLFIRI/Avastin since March of 2013. Approximately 2 months ago he presented with an abscess adjacent to his rectum felt to be secondary to malignant perforation or anastomotic leak. He underwent percutaneous drainage. Subsequently Dr. [name] performed a laparoscopic assisted left colostomy for diversion. This was one month ago. Multiple rectal biopsies were done at that time which were negative for malignancy.The patient was  readmitted not long after surgery due to apparent small bowel obstruction or possible obstipation. He responded to conservative measures and ostomy irrigation and was discharged. The patient returned to ED 9/22 with recurrent abdominal pain and SBO pattern on KUB. He underwent ex lap and small bowel resection 9/25 with incidental appendectomy complicated boy inadvertent enterotomies. He was extubated in the PACU but, per report, had feculent emesis and required re-intubation due to respiratory distress and suspected aspiration. PCCM is asked to assist with vent and CCM issues   Past Medical History  Diagnosis Date  . Hypertension     no meds  . Cancer     rectal   . 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 Marital Status: Single    Spouse Name: N/A    Number of Children: N/A  . Years of Education: N/A   Occupational History  . Not on file.   Social History Main Topics  . Smoking status: Current Every Day Smoker -- 0.2 packs/day for 12 years    Types: Cigarettes  . Smokeless tobacco: Never Used  . Alcohol Use: Yes     has quit drinking since last week.   . Drug Use: No  . Sexually Active: No   Other Topics Concern  . Not on file   Social History Narrative  . No narrative on file    Family History  Problem Relation Age of Onset  . Cancer Mother     lung    ROS - N/A  Filed Vitals:   11/10/11 1522 11/10/11 1545 11/10/11 1550 11/10/11 1600  BP:  98/74 113/76  131/91  Pulse:  120  130  Temp:      TempSrc:      Resp:  21  16  Height:   5\' 8"  (1.727 m)   Weight:      SpO2:  100%  99%    EXAM:  Gen: WDWN, sedated and intubated, RASS -2 HEENT: WNL Neck: No JVD Lungs: clear anteriorly without wheezes Cardiovascular: RRR, no M Abdomen: mildly distended, firm, quiet, midline scar with wound  vacuum Musculoskeletal: normal development, no edema Neuro: MAEs, CNs intact   DATA:   CXR: 9/25 ETT well positioned, RLL atx vs infiltrate EKG: 9/23 ST, nondiagnostic inferior Qs  BMET    Component Value Date/Time   NA 135 11/10/2011 0600   K 4.8 11/10/2011 0600   CL 98 11/10/2011 0600   CO2 29 11/10/2011 0600   GLUCOSE 144* 11/10/2011 0600   BUN 10 11/10/2011 0600   CREATININE 0.57 11/10/2011 0600   CALCIUM 9.2 11/10/2011 0600   GFRNONAA >90 11/10/2011 0600   GFRAA >90 11/10/2011 0600    CBC    Component Value Date/Time   WBC 5.6 11/10/2011 0600   WBC 5.7 09/16/2011 0940   RBC 3.99* 11/10/2011 0600   RBC 3.57* 09/16/2011 0940   HGB 11.7* 11/10/2011 0600   HGB 11.3* 09/16/2011 0940   HCT 36.4* 11/10/2011 0600   HCT 33.8* 09/16/2011 0940   PLT 341 11/10/2011 0600   PLT 304 09/16/2011 0940   MCV 91.2 11/10/2011 0600   MCV 94.7 09/16/2011 0940   MCH 29.3 11/10/2011 0600   MCH 31.8 09/16/2011 0940   MCHC 32.1 11/10/2011 0600   MCHC 33.6 09/16/2011 0940   RDW 14.6 11/10/2011 0600   RDW 14.5 09/16/2011 0940   LYMPHSABS 0.7 11/10/2011 0600   LYMPHSABS 0.7* 09/16/2011 0940   MONOABS 0.8 11/10/2011 0600   MONOABS 0.9 09/16/2011 0940   EOSABS 0.0 11/10/2011 0600   EOSABS 0.0 09/16/2011 0940   BASOSABS 0.0 11/10/2011 0600   BASOSABS 0.0 09/16/2011 0940       IMPRESSION:   Principal Problem:  *S/P laparotomy 9/25 Active Problems:  Acute respiratory failure following trauma and surgery  Peritonitis  Aspiration pneumonia  Tobacco abuse  Hypotension  ADENOCARCINOMA, RECTUM, ypT3ypN0M1   PLAN:  1) vent settings established, daily SBT if meets crtieria 2) Continuous sedation, daily WUA 3) DVT and SUP ordered 4) empiric BDs given smoking history 5) NS bolus for presumed hypovolemia (high NGT output) 6) Monitor CVP X 24 hrs - goal 10-14 7) Monitor Uo and BMET 8) TPN and SSI ordered 9) Broad spectrum abx as above 10) CBC, PCT AM 9/26   CCM X 35 mins   Billy Fischer, MD ; [facility] service Mobile  [phone].  After 5:30 PM or weekends, call [phone]

## 2011-11-10 NOTE — Progress Notes (Addendum)
ANTIBIOTIC CONSULT NOTE - INITIAL  Pharmacy Consult for Vancomycin, Primaxin Indication: peritonitis  No Known Allergies  Patient Measurements: Height: 5\' 8"  (172.7 cm) Weight: 128 lb 15.5 oz (58.5 kg) [name] : 68.4   Vital Signs: Temp: 96.7 F (35.9 C) (09/25 1445) Temp src: Oral (09/25 0613) BP: 113/76 mmHg (09/25 1545) Pulse Rate: 120  (09/25 1545) Intake/Output from previous day: 09/24 0701 - 09/25 0700 In: 2002.2 [I.V.:1972.2; NG/GT:30] Out: 695 [Urine:400; Emesis/NG output:250; Drains:45]   Labs:  Basename 11/10/11 0600 11/09/11 0510 11/08/11 0500 11/07/11 1700  WBC 5.6 -- 7.5 13.4*  HGB 11.7* -- 11.6* 13.4  PLT 341 -- 329 433*  LABCREA -- -- -- --  CREATININE 0.57 0.54 0.69 --   Estimated Creatinine Clearance: 89.4 ml/min (by C-G formula based on Cr of 0.57). No results found for this basename: VANCOTROUGH:2,VANCOPEAK:2,VANCORANDOM:2,GENTTROUGH:2,GENTPEAK:2,GENTRANDOM:2,TOBRATROUGH:2,TOBRAPEAK:2,TOBRARND:2,AMIKACINPEAK:2,AMIKACINTROU:2,AMIKACIN:2, in the last 72 hours   Microbiology: Recent Results (from the past 720 hour(s))  URINE CULTURE     Status: Normal   Collection Time   11/07/11  4:45 PM      Component Value Range Status Comment   Specimen Description URINE, CLEAN CATCH   Final    Special Requests NONE   Final    Culture  Setup Time 11/07/2011 20:12   Final    Colony Count >=100,000 COLONIES/ML   Final    Culture KLEBSIELLA PNEUMONIAE   Final    Report Status 11/09/2011 FINAL   Final    Organism ID, Bacteria KLEBSIELLA PNEUMONIAE   Final     Medical History: Past Medical History  Diagnosis Date  . Hypertension     no meds  . Cancer     rectal   . 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  10/12/2011    Medications:  Anti-infectives     Start     Dose/Rate Route Frequency Ordered Stop   11/10/11 0933   cefOXitin (MEFOXIN)  2 g in dextrose 5 % 50 mL IVPB        2 g 100 mL/hr over 30 Minutes Intravenous 60 min pre-op 11/10/11 0926 11/10/11 1105   11/10/11 0930   cefOXitin (MEFOXIN) 1 g in dextrose 5 % 50 mL IVPB  Status:  Discontinued        1 g 100 mL/hr over 30 Minutes Intravenous 3 times per day 11/10/11 0924 11/10/11 0926   11/07/11 2200   cefTRIAXone (ROCEPHIN) 1 g in dextrose 5 % 50 mL IVPB  Status:  Discontinued        1 g 100 mL/hr over 30 Minutes Intravenous Every 24 hours 11/07/11 2124 11/10/11 1536   11/07/11 1830   cefTRIAXone (ROCEPHIN) 1 g in dextrose 5 % 50 mL IVPB        1 g 100 mL/hr over 30 Minutes Intravenous  Once 11/07/11 1819 11/07/11 1920         Assessment:  51 YOM with small bowel obstruction, metastatic rectal cancer, rectal leak, abscess.   S/p small bowel resection x2, appendectomy 11/10/11 with concern for peritonitis.  WBC 5.6, Afebrile, CrCl ~ 89 ml/min  Previously on ceftriaxone this admission for UTI, cefoxitin 2g IV given pre-op 9/25  9/22 Urine culture: >100,000 Klebsiella pneumoniae (sensitive: cefazolin, ceftriaxone, cipro, gent, levo, zosyn, bactrim).  Goal of Therapy:  Vancomycin trough level 15-20 mcg/ml  Plan:   Fluconazole 200mg  IV q24h  Zosyn 3.375g IV  Q8H infused over 4hrs.  Vancomycin 500mg  IV q8h.  Measure Vanc trough at steady state.  Follow up renal fxn and culture results.    Lynann Beaver PharmD, BCPS Pager [phone] 11/10/2011 4:22 PM     Addendum:  Per MD orders, Fluconazole changed to Micafungin.  Zosyn d/c and changed to Primaxin per pharmacy dosing.  Start Primaxin 250mg  IV q6h.  Follow up renal function and culture results.  Lynann Beaver PharmD, BCPS Pager [phone] 11/10/2011 7:49 PM

## 2011-11-10 NOTE — Progress Notes (Signed)
eLink Physician-Brief Progress Note Patient Name: Jeffery [name] DOB: 1960/12/22 MRN: [id number]  Date of Service  11/10/2011   HPI/Events of Note   cvp 6, tachy  eICU Interventions  bolus   Intervention Category Major Interventions: Shock - evaluation and management  Nelda Bucks. 11/10/2011, 10:28 PM

## 2011-11-10 NOTE — Anesthesia Preprocedure Evaluation (Signed)
Anesthesia Evaluation  Patient identified by MRN, date of birth, ID band Patient awake    Reviewed: Allergy & Precautions, H&P , NPO status , Patient's Chart, lab work & pertinent test results, reviewed documented beta blocker date and time   Airway Mallampati: I TM Distance: >3 FB Neck ROM: full    Dental  (+) Dental Advidsory Given   Pulmonary Current Smoker,  pulm nodules breath sounds clear to auscultation  Pulmonary exam normal       Cardiovascular hypertension, Pt. on medications Rhythm:regular Rate:Normal     Neuro/Psych negative neurological ROS     GI/Hepatic Neg liver ROS, Colon cancer   Endo/Other  negative endocrine ROS  Renal/GU negative Renal ROS     Musculoskeletal   Abdominal   Peds  Hematology   Anesthesia Other Findings   Reproductive/Obstetrics                           Anesthesia Physical  Anesthesia Plan  ASA: III  Anesthesia Plan: General   Post-op Pain Management:    Induction: Intravenous  Airway Management Planned: Oral ETT  Additional Equipment:   Intra-op Plan:   Post-operative Plan: Extubation in OR  Informed Consent: I have reviewed the patients History and Physical, chart, labs and discussed the procedure including the risks, benefits and alternatives for the proposed anesthesia with the patient or authorized representative who has indicated his/her understanding and acceptance.   Dental Advisory Given and Dental advisory given  Plan Discussed with: CRNA, Anesthesiologist and Surgeon  Anesthesia Plan Comments:         Anesthesia Quick Evaluation

## 2011-11-10 NOTE — Op Note (Signed)
PRE-OPERATIVE DIAGNOSIS: Closed loop obstruction  POST-OPERATIVE DIAGNOSIS:  Same  PROCEDURE:  Procedure(s): Diagnostic laparoscopy, exploratory laparotomy, small bowel resection x2, incidental appendectomy  SURGEON:  Surgeon(s): Almond Lint, MD  ASSISTANT:  Marcille Blanco, MD  ANESTHESIA:   general  DRAINS: Would vac   LOCAL MEDICATIONS USED:  BUPIVICAINE  and LIDOCAINE   SPECIMEN:  Source of Specimen:  jejunum and ileum  DISPOSITION OF SPECIMEN:  PATHOLOGY  COUNTS:  YES  PLAN OF CARE: Admit to inpatient   PATIENT DISPOSITION:  PACU - hemodynamically stable.  PROCEDURE:    Patient was identified in the holding area and taken the operating room where he was placed on the operating room table. General anesthesia was induced. His arms were tucked. The abdomen was prepped and draped in sterile fashion. The ostomy was covered with a gauze and Tegaderm. Timeout was performed according to the surgical safety checklist. When all was correct we continued.  The supraumbilical skin was anesthetized with local anesthetic. A vertical midline incision was made with a #15 blade. The scope recurrence or used to elevate the fascia. The fascia was entered sharply with Metzenbaum scissors.  A pursestring suture was placed around the fascial incision. A Roseanne Reno was placed into the abdomen pneumoperitoneum was achieved a pressure 15 mm mercury. Patient is pleasant additional Trendelenburg position. A second trocar was placed in the abdomen in the right abdomen.  The bowel is too dilated to be able to successfully and the bowel laparoscopically admitted for elective was tethered in the pelvis. Midline incision was made and the laparoscopic portion was aborted.  It was apparent that the pelvis was tethered in the pelvis. Once the midline incision had been made on the bowel was very gently mobilized. There appeared to be a loop of bowel adherent in the pelvis and twisted upon itself. In gentle handling of the  bowel the dilated loop ruptured. This defect was whipstitched closed with a 3-0 Vicryl.  The bowel was run from the ligament of Treitz distally. The area and the pelvis was flap and dissected sharply with Metzenbaum scissors. There were several firm areas of scar tissue adherent to the bowel.  Serosa was quite beat up in this area. The terminal ileum and cecum were mobilized.  Once the bowel had been completely freed up, a decision was made about where to resect.  The portion in the pelvis with a firm scar tissue and serosal injuries was resected. This was done by identifying the portion of bowel. Mesenteric window was made with sides and the bowel is better with the GIA-75. The LigaSure was used to divide the mesentery. A side-to-side functional end-to-end anastomosis was created. 3-0 Vicryls were used to place stay sutures and an apex suture.  The mesenteric defect was closed with a 3-0 Vicryl as well.  The area of the perforation was divided in a similar manner. A side-to-side functional end-to-end anastomosis was created there as well. The bowel is fed back into the abdomen from the ligament of Treitz distally. The abdomen was copiously irrigated with 12 L of saline.  The skin was freed off of the fascia and the fascia was closed with running #1 looped PDS suture. The skin was irrigated and then dressed with a wound VAC.  An ostomy appliance reapplied. The patient was left intubated and brought to the PACU intubated because of the feculent NG output that was also coming out of his mouth.  Needle, sponge, and instrument counts were correct.

## 2011-11-10 NOTE — Addendum Note (Signed)
Addendum  created 11/10/11 1509 by Phillips Grout, MD   Modules edited:Anesthesia LDA

## 2011-11-11 ENCOUNTER — Inpatient Hospital Stay (HOSPITAL_COMMUNITY): Payer: Medicaid Other

## 2011-11-11 ENCOUNTER — Inpatient Hospital Stay (HOSPITAL_COMMUNITY)
Admission: RE | Admit: 2011-11-11 | Discharge: 2011-11-11 | Payer: Medicaid Other | Source: Ambulatory Visit | Attending: Nurse Practitioner | Admitting: Nurse Practitioner

## 2011-11-11 ENCOUNTER — Encounter (HOSPITAL_COMMUNITY): Payer: Self-pay | Admitting: General Surgery

## 2011-11-11 DIAGNOSIS — A419 Sepsis, unspecified organism: Secondary | ICD-10-CM | POA: Diagnosis not present

## 2011-11-11 DIAGNOSIS — R6521 Severe sepsis with septic shock: Secondary | ICD-10-CM | POA: Diagnosis not present

## 2011-11-11 DIAGNOSIS — N289 Disorder of kidney and ureter, unspecified: Secondary | ICD-10-CM | POA: Diagnosis not present

## 2011-11-11 DIAGNOSIS — R6520 Severe sepsis without septic shock: Secondary | ICD-10-CM | POA: Diagnosis not present

## 2011-11-11 LAB — COMPREHENSIVE METABOLIC PANEL
AST: 21 U/L (ref 0–37)
Albumin: 1.6 g/dL — ABNORMAL LOW (ref 3.5–5.2)
Alkaline Phosphatase: 38 U/L — ABNORMAL LOW (ref 39–117)
Calcium: 6.9 mg/dL — ABNORMAL LOW (ref 8.4–10.5)
GFR calc non Af Amer: 36 mL/min — ABNORMAL LOW (ref >90)
Glucose, Bld: 97 mg/dL (ref 70–99)
Sodium: 138 mEq/L (ref 135–145)
Total Bilirubin: 0.6 mg/dL (ref 0.3–1.2)
Total Protein: 4.6 g/dL — ABNORMAL LOW (ref 6.0–8.3)

## 2011-11-11 LAB — CBC
HCT: 38.1 % — ABNORMAL LOW (ref 39.0–52.0)
Hemoglobin: 12.1 g/dL — ABNORMAL LOW (ref 13.0–17.0)
MCH: 29.1 pg (ref 26.0–34.0)
MCHC: 31.8 g/dL (ref 30.0–36.0)
RDW: 14.4 % (ref 11.5–15.5)

## 2011-11-11 LAB — BASIC METABOLIC PANEL
CO2: 17 mEq/L — ABNORMAL LOW (ref 19–32)
Chloride: 114 mEq/L — ABNORMAL HIGH (ref 96–112)
Glucose, Bld: 126 mg/dL — ABNORMAL HIGH (ref 70–99)
Potassium: 5.2 mEq/L — ABNORMAL HIGH (ref 3.5–5.1)
Sodium: 140 mEq/L (ref 135–145)

## 2011-11-11 LAB — GLUCOSE, CAPILLARY
Glucose-Capillary: 82 mg/dL (ref 70–99)
Glucose-Capillary: 135 mg/dL — ABNORMAL HIGH (ref 70–99)

## 2011-11-11 LAB — PHOSPHORUS: Phosphorus: 3.3 mg/dL (ref 2.3–4.6)

## 2011-11-11 MED ORDER — ENOXAPARIN SODIUM 30 MG/0.3ML ~~LOC~~ SOLN
30.0000 mg | SUBCUTANEOUS | Status: DC
  Administered 2011-11-11: 30 mg via SUBCUTANEOUS
  Filled 2011-11-11 (×2): qty 0.3

## 2011-11-11 MED ORDER — ACETAMINOPHEN 10 MG/ML IV SOLN
1000.0000 mg | Freq: Once | INTRAVENOUS | Status: AC
  Administered 2011-11-11: 1000 mg via INTRAVENOUS
  Filled 2011-11-11: qty 100

## 2011-11-11 MED ORDER — BIOTENE DRY MOUTH MT LIQD
15.0000 mL | Freq: Four times a day (QID) | OROMUCOSAL | Status: DC
  Administered 2011-11-11 – 2011-12-01 (×74): 15 mL via OROMUCOSAL

## 2011-11-11 MED ORDER — SODIUM CHLORIDE 0.9 % IV SOLN
INTRAVENOUS | Status: DC
  Administered 2011-11-13: 20 mL/h via INTRAVENOUS
  Administered 2011-11-14: 500 mL via INTRAVENOUS

## 2011-11-11 MED ORDER — VANCOMYCIN HCL 500 MG IV SOLR
500.0000 mg | Freq: Two times a day (BID) | INTRAVENOUS | Status: DC
  Administered 2011-11-11 – 2011-11-12 (×3): 500 mg via INTRAVENOUS
  Filled 2011-11-11 (×5): qty 500

## 2011-11-11 MED ORDER — SODIUM CHLORIDE 0.9 % IV BOLUS (SEPSIS)
1000.0000 mL | Freq: Once | INTRAVENOUS | Status: AC
  Administered 2011-11-11: 1000 mL via INTRAVENOUS

## 2011-11-11 MED ORDER — SODIUM CHLORIDE 0.9 % IV BOLUS (SEPSIS)
500.0000 mL | Freq: Once | INTRAVENOUS | Status: AC
  Administered 2011-11-11: 500 mL via INTRAVENOUS

## 2011-11-11 MED ORDER — CLINIMIX E/DEXTROSE (5/20) 5 % IV SOLN
INTRAVENOUS | Status: AC
  Administered 2011-11-11: 17:00:00 via INTRAVENOUS
  Filled 2011-11-11: qty 1000

## 2011-11-11 MED ORDER — ALBUMIN HUMAN 25 % IV SOLN
25.0000 g | Freq: Once | INTRAVENOUS | Status: AC
  Administered 2011-11-11: 25 g via INTRAVENOUS

## 2011-11-11 MED ORDER — ALBUMIN HUMAN 25 % IV SOLN
25.0000 g | Freq: Three times a day (TID) | INTRAVENOUS | Status: DC
  Filled 2011-11-11 (×3): qty 100

## 2011-11-11 MED ORDER — ZINC TRACE METAL 1 MG/ML IV SOLN: INTRAVENOUS | Status: AC

## 2011-11-11 MED ORDER — MAGNESIUM SULFATE 40 MG/ML IJ SOLN
2.0000 g | Freq: Once | INTRAMUSCULAR | Status: AC
  Administered 2011-11-11: 2 g via INTRAVENOUS
  Filled 2011-11-11: qty 50

## 2011-11-11 MED FILL — Lactated Ringer's Solution: INTRAVENOUS | Qty: 1000 | Status: AC

## 2011-11-11 NOTE — Progress Notes (Signed)
eLink Physician-Brief Progress Note Patient Name: Jeffery [name] DOB: 04/07/1960 MRN: [id number]  Date of Service  11/11/2011   HPI/Events of Note     eICU Interventions  Iv acetaminophen for fever & tachy   Intervention Category Minor Interventions: Routine modifications to care plan (e.g. PRN medications for pain, fever)  Lonnie Reth V. 11/11/2011, 1:56 AM

## 2011-11-11 NOTE — Progress Notes (Signed)
ANTIBIOTIC CONSULT NOTE  Pharmacy Consult for Vancomycin, Primaxin Indication: peritonitis  No Known Allergies  Patient Measurements: Height: 5\' 8"  (172.7 cm) Weight: 142 lb 13.7 oz (64.8 kg) [name] : 68.4   Vital Signs: Temp: 97.9 F (36.6 C) (09/26 0800) Temp src: Oral (09/26 0800) BP: 99/66 mmHg (09/26 0900) Pulse Rate: 138  (09/26 0900) Intake/Output from previous day: 09/25 0701 - 09/26 0700 In: 9240.2 [I.V.:3525.2; NG/GT:60; IV Piggyback:4905; TPN:740] Out: 1368 [Urine:340; Emesis/NG output:370; Drains:33; Stool:475; Blood:150]   Labs:  [facility] 11/11/11 0353 11/10/11 2013 11/10/11 0600  WBC 2.7* 1.8* 5.6  HGB 12.1* 12.1* 11.7*  PLT 236 252 341  LABCREA -- -- --  CREATININE 2.01* 1.39* 0.57   Estimated Creatinine Clearance: 39.4 ml/min (by C-G formula based on Cr of 2.01).  Microbiology: 9/22 urine culture: >100,000 Klebsiella pneumoniae (sensitive: cefazolin, ceftriaxone, cipro, gent, levo, zosyn, bactrim).  Abx courses: Ceftriaxone 9/22 > 9/24 Cefoxitin (pre-op abx), Zosyn, Fluconazole x 1 9/25 Vanc 9/25 >> Primaxin 9/25 >> Micafungin 9/25 >>  Assessment:  51 YOM on day #2 vancomycin, primaxin, and micafungin to cover for peritonitis and pneumonia in an immunosuppressed patient.  PCT >200, WBC 2.7, Afebrile, SCr increasing (2.01 < 1.39 < 0.57) --> in ARF with low UOP, CrCl~43 ml/min (normalized).  Will adjust vancomycin dose today (currently 500 mg IV q8h) for decreased renal clearance.  Primaxin dose still appropriate.  No need to adjust micafungin for renal dysfunction.  Sputum culture ordered.  Goal of Therapy:  Vancomycin trough level 15-20 mcg/ml  Plan:   Adjust vancomycin to 500 mg IV q12h.  F/u SCr and culture results.   Clance Boll, PharmD, BCPS Pager: [phone] 11/11/2011 10:21 AM

## 2011-11-11 NOTE — Progress Notes (Signed)
PARENTERAL NUTRITION CONSULT NOTE - INITIAL  Pharmacy Consult for TPN Indication: Bowel obstruction  No Known Allergies  Patient Measurements: Height: 5\' 8"  (172.7 cm) Weight: 142 lb 13.7 oz (64.8 kg) [name] : 68.4  Usual Weight: 142 lbs  Vital Signs: Temp: 98.9 F (37.2 C) (09/26 0400) Temp src: Oral (09/26 0400) BP: 89/68 mmHg (09/26 0700) Pulse Rate: 135  (09/26 0700)  Intake/Output from previous day: 09/25 0701 - 09/26 0700 In: 9240.2 [I.V.:3525.2; NG/GT:60; IV Piggyback:4905; TPN:740] Out: 1368 [Urine:340; Emesis/NG output:370; Drains:33; Stool:475; Blood:150]  Labs:  [facility] 11/11/11 0353 11/10/11 2013 11/10/11 0600  WBC 2.7* 1.8* 5.6  HGB 12.1* 12.1* 11.7*  HCT 38.1* 38.7* 36.4*  PLT 236 252 341  APTT -- -- --  INR -- -- --     Basename 11/11/11 0353 11/10/11 2013 11/10/11 0600 11/09/11 0510  NA 138 138 135 --  K 4.2 3.2* 4.8 --  CL 111 111 98 --  CO2 16* 19 29 --  GLUCOSE 97 171* 144* --  BUN 23 18 10  --  CREATININE 2.01* 1.39* 0.57 --  LABCREA -- -- -- --  CREAT24HRUR -- -- -- --  CALCIUM 6.9* 6.8* 9.2 --  MG 1.2* -- 1.9 --  PHOS 3.3 -- 3.2 --  PROT 4.6* 4.5* 7.8 --  ALBUMIN 1.6* 1.7* 3.0* --  AST 21 13 32 --  ALT 8 7 11  --  ALKPHOS 38* 38* 68 --  BILITOT 0.6 1.2 0.3 --  BILIDIR -- -- -- --  IBILI -- -- -- --  PREALBUMIN -- -- 11.8* 13.1*  TRIG -- -- 48 --  CHOLHDL -- -- -- --  CHOL -- -- 106 --   Estimated Creatinine Clearance: 39.4 ml/min (by C-G formula based on Cr of 2.01).    Basename 11/11/11 0336 11/10/11 2327 11/10/11 2006  GLUCAP 82 85 140*   CBGs & Insulin requirements past 24 hours:  15 units of SSI provided yesterday  Nutritional Goals:   RD recs 9/23 :   Kcal 1700-1900, Protein 80-90 gm  Clinimix E 5/20 at goal rate of 70 ml/hr + Lipids MWF to provide: 84 g/day protein and avg. 1684 Kcal/day weekly (1958 Kcal/day MWF, 1478 Kcal/day STTHS).  Current nutrition:   Diet: NPO  Clinimix E 5/20 at 55  ml/hr  IVF: NS at 150 ml/hr  Assessment:   39 YOM with history of recurrent metastatic rectal cancer underwent exploratory laparotomy, small bowel resection x2, incidental appendectomy 9/25.  Pt thought to have aspirated in PACU post-op and was reintubated and transferred to ICU.    TNA started 9/24.  Patient tolerating.  Renal: SCr increasing, ARF, UOP 0.2 mL/kg/hr  LFTs: WNL  Electrolytes: Mag low at 1.2, K+/Phos WNL, Corr Ca WNL, CO2 low  Pre-Albumin: 13.1 (9/24), 11.8 (9/25)  TG/Cholesterol: WNL  CBG's: controlled (<150 mg/dL) after CCM adjusted SSI from sensitive scale to moderate scale.  No further adjustments today, will monitor control on moderate SSI q4h.  Plan:  Due to ARF, would like to reduce rate of TNA to limit protein administration until SCr begins to improve.  SCr began to increase yesterday as we increased rate of TNA.  Will reduce Clinimix E 5/20 to 40 ml/hr and will advance as renal function improves. Fat emulsion at 10 ml/hr (MWF only due to ongoing shortage). TNA to contain standard multivitamins and trace elements (MWF only due to ongoing shortage). Mag Sulfate 2g IV now for replacement. Deferring IVF management to MD. Continue moderate SSI  q4h. TNA lab panels on Mondays & Thursdays. F/u daily.  CMET ordered for AM.   Clance Boll, PharmD, BCPS Pager: [phone] 11/11/2011 7:14 AM

## 2011-11-11 NOTE — Progress Notes (Signed)
[id number] Earlene Plater, RN, BSN, CCM: CHART REVIEWED AND UPDATED. NO DISCHARGE NEEDS PRESENT AT THIS TIME. CASE MANAGEMENT [phone]

## 2011-11-11 NOTE — Progress Notes (Signed)
1 Day Post-Op  Subjective: Pt with SIRS/sepsis like response to spillage of succus intraoperatively.  Bumped creatinine.    Objective: Vital signs in last 24 hours: Temp:  [96.7 F (35.9 C)-101.1 F (38.4 C)] 98.9 F (37.2 C) (09/26 0400) Pulse Rate:  [100-162] 135  (09/26 0700) Resp:  [5-34] 28  (09/26 0700) BP: (72-153)/(43-129) 89/68 mmHg (09/26 0700) SpO2:  [92 %-100 %] 100 % (09/26 0700) FiO2 (%):  [40 %-100 %] 40 % (09/26 0700) Weight:  [142 lb 13.7 oz (64.8 kg)] 142 lb 13.7 oz (64.8 kg) (09/26 0515) Last BM Date: 11/10/11  Intake/Output from previous day: 09/25 0701 - 09/26 0700 In: 9240.2 [I.V.:3525.2; NG/GT:60; IV Piggyback:4905; TPN:740] Out: 1368 [Urine:340; Emesis/NG output:370; Drains:33; Stool:475; Blood:150] Intake/Output this shift:    General appearance: alert, mild distress and sedated/ ventilated Chest wall: no tenderness, tachycardic GI: soft, tender, vac in place Extremities: extremities normal, atraumatic, no cyanosis or edema  Lab Results:   [facility] 11/11/11 0353 11/10/11 2013  WBC 2.7* 1.8*  HGB 12.1* 12.1*  HCT 38.1* 38.7*  PLT 236 252   BMET  Basename 11/11/11 0353 11/10/11 2013  NA 138 138  K 4.2 3.2*  CL 111 111  CO2 16* 19  GLUCOSE 97 171*  BUN 23 18  CREATININE 2.01* 1.39*  CALCIUM 6.9* 6.8*   PT/INR No results found for this basename: LABPROT:2,INR:2 in the last 72 hours ABG  Basename 11/10/11 2028 11/10/11 1753  PHART 7.298* 7.219*  HCO3 16.8* 18.5*    Studies/Results: X-ray Chest Pa Or Ap  11/10/2011  *RADIOLOGY REPORT*  Clinical Data: Endotracheal tube placement  CHEST - 1 VIEW  Comparison: CT chest dated 10/07/2011  Findings: Endotracheal tube terminates 3 cm above the carina.  Enteric tube courses below the diaphragm.  Right chest power port and right arm PICC terminates in the lower SVC / cavoatrial junction.  Right lung metastases, the largest measuring approximate 2.4 cm in the right lower lung.  Known left lung  metastases are not well visualized radiographically.  Additional patchy right lower lobe opacity, likely atelectasis with possible small right pleural effusion.  No pneumothorax.  The heart is normal in size.  IMPRESSION: Endotracheal tube terminates 3 cm above the carina.  Additional support apparatus as above.  Right lung metastases.  Known left lung metastases are not well visualized radiographically.  Patchy right lower lobe opacity, likely atelectasis with a possible small right pleural effusion.   Original Report Authenticated By: Charline Bills, M.D.    Dg Chest Port 1 View  11/11/2011  *RADIOLOGY REPORT*  Clinical Data: Respiratory failure.  Ventilated patient.  PORTABLE CHEST - 1 VIEW  Comparison: 11/10/2011.  Findings:  Support apparatus appears stable, consisting of nasogastric tube, endotracheal tube, right subclavian power port and right upper extremity PICC.  Bilateral pulmonary nodules are again noted.  Basilar atelectasis.  IMPRESSION: Stable support apparatus. Lower lung volumes with increasing basilar atelectasis.   Original Report Authenticated By: Andreas Newport, M.D.    Dg Chest Port 1 View  11/10/2011  *RADIOLOGY REPORT*  Clinical Data: 51 year old male intubated.  Acute respiratory failure. Metastatic rectal carcinoma.  PORTABLE CHEST - 1 VIEW  Comparison: 1517 hours the same day and earlier.  Findings: Portable semi upright AP view 1958 hours.  Endotracheal tube tip in good position between the level of clavicles and carina.  Stable right chest port and right PICC line/subclavian line.  Enteric tube courses to the abdomen, tip not included.  Stable lung volumes.  Multiple right side lung nodules/masses re- identified. Overall stable ventilation.  No pneumothorax or definite effusion.  Cardiac size and mediastinal contours are within normal limits.  IMPRESSION: 1.  Lines and tubes appear stable and appropriately positioned. 2.  Stable ventilation. 3.  Pulmonary metastatic disease.    Original Report Authenticated By: Harley Hallmark, M.D.     Anti-infectives: Anti-infectives     Start     Dose/Rate Route Frequency Ordered Stop   11/11/11 2000   micafungin (MYCAMINE) 100 mg in sodium chloride 0.9 % 100 mL IVPB     Comments: stat      100 mg 100 mL/hr over 1 Hours Intravenous Every 24 hours 11/10/11 1939     11/10/11 2000   micafungin (MYCAMINE) 100 mg in sodium chloride 0.9 % 100 mL IVPB     Comments: stat      100 mg 100 mL/hr over 1 Hours Intravenous Daily 11/10/11 1939 11/10/11 2215   11/10/11 2000   imipenem-cilastatin (PRIMAXIN) 250 mg in sodium chloride 0.9 % 100 mL IVPB        250 mg 200 mL/hr over 30 Minutes Intravenous 4 times per day 11/10/11 1951     11/10/11 1630   fluconazole (DIFLUCAN) IVPB 200 mg  Status:  Discontinued        200 mg 100 mL/hr over 60 Minutes Intravenous Every 24 hours 11/10/11 1617 11/10/11 1939   11/10/11 1630   vancomycin (VANCOCIN) 500 mg in sodium chloride 0.9 % 100 mL IVPB        500 mg 100 mL/hr over 60 Minutes Intravenous 3 times per day 11/10/11 1617     11/10/11 1630   piperacillin-tazobactam (ZOSYN) IVPB 3.375 g  Status:  Discontinued        3.375 g 12.5 mL/hr over 240 Minutes Intravenous 3 times per day 11/10/11 1617 11/10/11 1941   11/10/11 0933   cefOXitin (MEFOXIN) 2 g in dextrose 5 % 50 mL IVPB        2 g 100 mL/hr over 30 Minutes Intravenous 60 min pre-op 11/10/11 0926 11/10/11 1105   11/10/11 0930   cefOXitin (MEFOXIN) 1 g in dextrose 5 % 50 mL IVPB  Status:  Discontinued        1 g 100 mL/hr over 30 Minutes Intravenous 3 times per day 11/10/11 0924 11/10/11 0926   11/07/11 2200   cefTRIAXone (ROCEPHIN) 1 g in dextrose 5 % 50 mL IVPB  Status:  Discontinued        1 g 100 mL/hr over 30 Minutes Intravenous Every 24 hours 11/07/11 2124 11/10/11 1536   11/07/11 1830   cefTRIAXone (ROCEPHIN) 1 g in dextrose 5 % 50 mL IVPB        1 g 100 mL/hr over 30 Minutes Intravenous  Once 11/07/11 1819 11/07/11 1920            Assessment/Plan: s/p Procedure(s) (LRB) with comments: LAPAROSCOPIC LYSIS OF ADHESIONS (N/A) - laparoscopic versus open lysis of adhesions EXPLORATORY LAPAROTOMY (N/A) - SMALL BOWEL RESECTION x 2 APPLICATION OF WOUND VAC () IV antibiotics for 5 days for fecal spillage. TNA for protein calorie malnutrition. NGT for ileus Possible aspiration Acute respiratory failure per CCM Fluid resuscitation for sepsis, hypovolemia.    LOS: 4 days    [facility] 11/11/2011

## 2011-11-11 NOTE — Progress Notes (Signed)
PULMONARY/CCM CONSULT NOTE  Requesting MD/Service: Byerly/CCS Date of admission: 9/22 Date of consult: 9/25 Reason for consultation: post op VDRF  Pt Profile:  51 yobm with history of recurrent metastatic rectal cancer underwent exploratory laparotomy, small bowel resection x2, incidental appendectomy 9/25. Was thought to have aspirated in PACU post operatively with feculent emesis reported. Required reintubation in PACU. PCCM asked to assist with vent/CCM issues.  Lines, Tubes, etc: ETT 9/25 >>  RUE PICC 9/24 >>  R [place] Portacath (longstanding)    Microbiology: Urine 9/22 >> Klebs (pansens) PCT 9/26: >200  Antibiotics:  Fluconazole 9/25 >> 9/25 Zosyn 9/25 >> 9/25 Vanc 9/25 >> Micafungin 9/26>>> Imipenem 9/25>>>   Studies/Events:   Consults:  CCS (primary)   Best Practice: DVT: LMWH - dose adjusted for renal insuff SUP: PPI Nutrition: TPN Glycemic control: SSI  Sedation/analgesia: continuous sedation   Filed Vitals:   11/11/11 0700 11/11/11 0759 11/11/11 0800 11/11/11 0804  BP: 89/68  106/62 106/62  Pulse: 135  137 144  Temp:      TempSrc:      Resp: 28  19 30   Height:      Weight:      SpO2: 100% 100% 99% 100%    EXAM:  Gen: WDWN, sedated and intubated, RASS -1 HEENT: mm moist, ETT Neck: No JVD Lungs: clear anteriorly without wheezes Cardiovascular: RRR, no M, tachy 140's Abdomen: moderately to severely distended, tympanitis, quiet, diffusely tender Musculoskeletal: normal development, no edema Neuro: MAEs, CNs intact, + F/C  CXR: bibasilar atx  BMET  Lab 11/11/11 0353 11/10/11 2013 11/10/11 0600 11/09/11 0510 11/08/11 0500  NA 138 138 135 136 140  K 4.2 3.2* -- -- --  CL 111 111 98 101 102  CO2 16* 19 29 27 30   GLUCOSE 97 171* 144* 137* 135*  BUN 23 18 10 9 18   CREATININE 2.01* 1.39* 0.57 0.54 0.69  CALCIUM 6.9* 6.8* 9.2 8.9 8.9  MG 1.2* -- 1.9 -- --  PHOS 3.3 -- 3.2 -- --    CBC  Lab 11/11/11 0353 11/10/11 2013 11/10/11 0600  HGB  12.1* 12.1* 11.7*  HCT 38.1* 38.7* 36.4*  WBC 2.7* 1.8* 5.6  PLT 236 252 341     IMPRESSION:    Acute respiratory failure - post op abd surgery with ? aspiration, active smoker  ?Aspiration PNA -  PLAN -  Cont full vent support for now. Vent changes made  Cont daily WUA/ SBT Follow CXR  Sputum culture  Cont broad spectrum abx  Cont empiric BDs  Severe sepsis/occult shock -- r/t intra-abd contamination/peritonitis in OR Tachycardia - persistent despite B blocker which BP did not tolerate.  CVP=7 PLAN -  NS 1L x 1 now  Gentle volume F/u CVP - goal 10-12 F/u lactate, pct    Peritonitis Aspiration PNA --  Pct>200.   PLAN -  Cont broad spectrum abx   F/u culture  F/u pct   Acute renal failure -- ATN in setting severe sepsis Met acidosis-  Mild.  PLAN -  Maintain CVP 10-14 Monitor UOP Replete lytes PRN  F/u chem this afternoon and in am with rising SCr and K Pharmacy to dose meds/ adjust TNA   S/p exp lap - small bowel resection with fecal contamination  Rectal ca  PLAN -  Surgery following Cont VAC TPN  NG tube  Abx as above    WHITEHEART,KATHRYN, NP 11/11/2011  8:49 AM Pager: [phone]) [phone] or [phone]  I have interviewed  and examined the patient and reviewed the database. I have formulated the assessment and plan as reflected in the note above with amendments made by me. 40 mins of direct critical care time provided  Billy Fischer, MD;  PCCM service; Mobile [phone]

## 2011-11-12 ENCOUNTER — Inpatient Hospital Stay (HOSPITAL_COMMUNITY): Payer: Medicaid Other

## 2011-11-12 LAB — CBC
HCT: 29.6 % — ABNORMAL LOW (ref 39.0–52.0)
MCH: 28.7 pg (ref 26.0–34.0)
MCV: 91.4 fL (ref 78.0–100.0)
Platelets: 142 10*3/uL — ABNORMAL LOW (ref 150–400)
RDW: 14.9 % (ref 11.5–15.5)

## 2011-11-12 LAB — GLUCOSE, CAPILLARY
Glucose-Capillary: 120 mg/dL — ABNORMAL HIGH (ref 70–99)
Glucose-Capillary: 107 mg/dL — ABNORMAL HIGH (ref 70–99)
Glucose-Capillary: 120 mg/dL — ABNORMAL HIGH (ref 70–99)

## 2011-11-12 LAB — COMPREHENSIVE METABOLIC PANEL
AST: 28 U/L (ref 0–37)
Albumin: 1.8 g/dL — ABNORMAL LOW (ref 3.5–5.2)
Alkaline Phosphatase: 42 U/L (ref 39–117)
Chloride: 114 mEq/L — ABNORMAL HIGH (ref 96–112)
Potassium: 4.6 mEq/L (ref 3.5–5.1)
Sodium: 140 mEq/L (ref 135–145)
Total Bilirubin: 0.6 mg/dL (ref 0.3–1.2)

## 2011-11-12 MED ORDER — METOPROLOL TARTRATE 1 MG/ML IV SOLN
5.0000 mg | Freq: Four times a day (QID) | INTRAVENOUS | Status: DC
  Administered 2011-11-12 – 2011-11-23 (×41): 5 mg via INTRAVENOUS
  Filled 2011-11-12 (×48): qty 5

## 2011-11-12 MED ORDER — LEVALBUTEROL HCL 0.63 MG/3ML IN NEBU
0.6300 mg | INHALATION_SOLUTION | Freq: Four times a day (QID) | RESPIRATORY_TRACT | Status: DC
  Administered 2011-11-12 – 2011-11-22 (×38): 0.63 mg via RESPIRATORY_TRACT
  Filled 2011-11-12 (×43): qty 3

## 2011-11-12 MED ORDER — ENOXAPARIN SODIUM 40 MG/0.4ML ~~LOC~~ SOLN
40.0000 mg | SUBCUTANEOUS | Status: DC
  Administered 2011-11-12 – 2011-11-13 (×2): 40 mg via SUBCUTANEOUS
  Filled 2011-11-12 (×3): qty 0.4

## 2011-11-12 MED ORDER — IPRATROPIUM BROMIDE 0.02 % IN SOLN: 0.5000 mg | Freq: Four times a day (QID) | RESPIRATORY_TRACT | Status: DC

## 2011-11-12 MED ORDER — FAT EMULSION 20 % IV EMUL
240.0000 mL | INTRAVENOUS | Status: AC
  Administered 2011-11-12: 240 mL via INTRAVENOUS
  Filled 2011-11-12: qty 250

## 2011-11-12 MED ORDER — FENTANYL CITRATE 0.05 MG/ML IJ SOLN
25.0000 ug | INTRAMUSCULAR | Status: DC | PRN
  Administered 2011-11-12 – 2011-11-13 (×4): 50 ug via INTRAVENOUS
  Filled 2011-11-12 (×5): qty 2

## 2011-11-12 MED ORDER — ALBUTEROL SULFATE (5 MG/ML) 0.5% IN NEBU
2.5000 mg | INHALATION_SOLUTION | RESPIRATORY_TRACT | Status: DC
  Administered 2011-11-12: 2.5 mg via RESPIRATORY_TRACT
  Filled 2011-11-12: qty 0.5

## 2011-11-12 MED ORDER — SODIUM CHLORIDE 0.9 % IV SOLN
500.0000 mg | Freq: Three times a day (TID) | INTRAVENOUS | Status: DC
  Administered 2011-11-12 – 2011-11-14 (×6): 500 mg via INTRAVENOUS
  Filled 2011-11-12 (×8): qty 500

## 2011-11-12 MED ORDER — FUROSEMIDE 10 MG/ML IJ SOLN
40.0000 mg | Freq: Once | INTRAMUSCULAR | Status: AC
  Administered 2011-11-12: 40 mg via INTRAVENOUS
  Filled 2011-11-12: qty 4

## 2011-11-12 MED ORDER — IPRATROPIUM BROMIDE 0.02 % IN SOLN
0.5000 mg | RESPIRATORY_TRACT | Status: DC
  Administered 2011-11-12: 0.5 mg via RESPIRATORY_TRACT
  Filled 2011-11-12: qty 2.5

## 2011-11-12 MED ORDER — LEVALBUTEROL HCL 0.63 MG/3ML IN NEBU
0.6300 mg | INHALATION_SOLUTION | RESPIRATORY_TRACT | Status: DC | PRN
  Filled 2011-11-12: qty 3

## 2011-11-12 MED ORDER — ALBUTEROL SULFATE (5 MG/ML) 0.5% IN NEBU: 2.5000 mg | INHALATION_SOLUTION | RESPIRATORY_TRACT | Status: DC

## 2011-11-12 MED ORDER — ALBUTEROL SULFATE (5 MG/ML) 0.5% IN NEBU: 2.5000 mg | INHALATION_SOLUTION | Freq: Four times a day (QID) | RESPIRATORY_TRACT | Status: DC

## 2011-11-12 MED ORDER — ZINC TRACE METAL 1 MG/ML IV SOLN
INTRAVENOUS | Status: AC
  Administered 2011-11-12: 17:00:00 via INTRAVENOUS
  Filled 2011-11-12: qty 1000

## 2011-11-12 NOTE — Progress Notes (Addendum)
PARENTERAL NUTRITION CONSULT NOTE   Pharmacy Consult for TPN Indication: Bowel obstruction  No Known Allergies  Patient Measurements: Height: 5\' 8"  (172.7 cm) Weight: 150 lb 12.7 oz (68.4 kg) [name] : 68.4  Usual Weight: 142 lbs  Vital Signs: Temp: 100.7 F (38.2 C) (09/27 0400) Temp src: Oral (09/27 0400) BP: 100/56 mmHg (09/27 0600) Pulse Rate: 135  (09/27 0600)  Intake/Output from previous day: 09/26 0701 - 09/27 0700 In: 4768 [I.V.:853; NG/GT:90; IV Piggyback:2750; TPN:1075] Out: 1673 [Urine:1478; Emesis/NG output:100; Drains:70; Stool:25]  Labs:  [facility] 11/12/11 0450 11/11/11 0353 11/10/11 2013  WBC 7.0 2.7* 1.8*  HGB 9.3* 12.1* 12.1*  HCT 29.6* 38.1* 38.7*  PLT 142* 236 252  APTT -- -- --  INR 1.76* -- --     Basename 11/12/11 0450 11/11/11 1800 11/11/11 0353 11/10/11 2013 11/10/11 0600  NA 140 140 138 -- --  K 4.6 5.2* 4.2 -- --  CL 114* 114* 111 -- --  CO2 17* 17* 16* -- --  GLUCOSE 127* 126* 97 -- --  BUN 27* 30* 23 -- --  CREATININE 1.55* 2.10* 2.01* -- --  LABCREA -- -- -- -- --  CREAT24HRUR -- -- -- -- --  CALCIUM 6.7* 6.6* 6.9* -- --  MG -- -- 1.2* -- 1.9  PHOS -- -- 3.3 -- 3.2  PROT 4.8* -- 4.6* 4.5* --  ALBUMIN 1.8* -- 1.6* 1.7* --  AST 28 -- 21 13 --  ALT 8 -- 8 7 --  ALKPHOS 42 -- 38* 38* --  BILITOT 0.6 -- 0.6 1.2 --  BILIDIR -- -- -- -- --  IBILI -- -- -- -- --  PREALBUMIN -- -- -- -- 11.8*  TRIG -- -- -- -- 48  CHOLHDL -- -- -- -- --  CHOL -- -- -- -- 106   Estimated Creatinine Clearance: 53.9 ml/min (by C-G formula based on Cr of 1.55).    Basename 11/12/11 0346 11/12/11 0015 11/11/11 2012  GLUCAP 121* 112* 135*   CBGs & Insulin requirements past 24 hours:  8 units of SSI provided yesterday  Nutritional Goals:   RD recs 9/23 :   Kcal 1700-1900, Protein 80-90 gm  Clinimix E 5/20 at goal rate of 70 ml/hr + Lipids MWF to provide: 84 g/day protein and avg. 1684 Kcal/day weekly (1958 Kcal/day MWF, 1478 Kcal/day  STTHS).  Current nutrition:   Diet: NPO  Clinimix E 5/20 at 40 ml/hr  IVF: NS at 10-20 ml/hr  Assessment:   [name] with history of recurrent metastatic rectal cancer underwent exploratory laparotomy, small bowel resection x2, incidental appendectomy 9/25.  Pt thought to have aspirated in PACU post-op and was reintubated and transferred to ICU    TNA started 9/24. Rate increased 9/25 pm but had to be decreased 9/26 pm d/t ARF.  Renal: SCr was increasing, ARF, now improved. UOP improved  LFTs: WNL 9/26  Electrolytes: Mag low 9/26, replaced. No Mg level this am. K WNL, Corr Ca WNL, CO2 low/Cl high   Pre-Albumin: 13.1 (9/24), 11.8 (9/25)  TG/Cholesterol: WNL 9/26  CBG's: controlled (<150 mg/dL) -  SSI  moderate scale.    Plan:  Will continue TNA at decreased rate of 93ml/hr to limit protein administration until SCr improvement proves consistent.   Fat emulsion at 10 ml/hr (MWF only due to ongoing shortage). TNA to contain standard multivitamins and trace elements (MWF only due to ongoing shortage). Deferring IVF management to MD. Continue moderate SSI q4h. TNA lab panels  on Mondays & Thursdays. AM Mg to evaluate replacement done 9/26  Jeffery [name] PharmD  [phone] 11/12/2011 8:05 AM

## 2011-11-12 NOTE — Progress Notes (Signed)
eLink Physician-Brief Progress Note Patient Name: Jeffery [name] DOB: 06-23-1960 MRN: [id number]  Date of Service  11/12/2011   HPI/Events of Note   Ongoing tachycardia with HR in the 140s.  Currently on albuterol/atrovent  eICU Interventions  Plan: Change to xopenex q6 hours and prn   Intervention Category Intermediate Interventions: Arrhythmia - evaluation and management  DETERDING,ELIZABETH 11/12/2011, 9:17 PM

## 2011-11-12 NOTE — Progress Notes (Signed)
Expect slow return to bowel function.  On TNA for prolonged ileus.  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

## 2011-11-12 NOTE — Progress Notes (Signed)
2 Days Post-Op  Subjective: Still sedated some, being extubated.  Objective: Vital signs in last 24 hours: Temp:  [99.2 F (37.3 C)-101.3 F (38.5 C)] 101.3 F (38.5 C) (09/27 0800) Pulse Rate:  [126-142] 135  (09/27 0600) Resp:  [12-19] 16  (09/27 0600) BP: (87-108)/(56-75) 100/56 mmHg (09/27 0600) SpO2:  [96 %-100 %] 98 % (09/27 0755) FiO2 (%):  [30 %-40 %] 30 % (09/27 0755) Weight:  [68.4 kg (150 lb 12.7 oz)] 68.4 kg (150 lb 12.7 oz) (09/27 0443) Last BM Date: 11/11/11  Tm 101.3, not much from NG or colostomy.  Tachycardic, bp 100/60 range.  Intake/Output from previous day: 09/26 0701 - 09/27 0700 In: 4768 [I.V.:853; NG/GT:90; IV Piggyback:2750; TPN:1075] Out: 1673 [Urine:1478; Emesis/NG output:100; Drains:70; Stool:25] Intake/Output this shift:    General appearance: still looks sedated to me. Resp: being extubated now. Abd:  BS diminished, not much thru ostomy or NG  Lab Results:   Basename 11/12/11 0450 11/11/11 0353  WBC 7.0 2.7*  HGB 9.3* 12.1*  HCT 29.6* 38.1*  PLT 142* 236    BMET  Basename 11/12/11 0450 11/11/11 1800  NA 140 140  K 4.6 5.2*  CL 114* 114*  CO2 17* 17*  GLUCOSE 127* 126*  BUN 27* 30*  CREATININE 1.55* 2.10*  CALCIUM 6.7* 6.6*   PT/INR  Basename 11/12/11 0450  LABPROT 19.9*  INR 1.76*     Lab 11/12/11 0450 11/11/11 0353 11/10/11 2013 11/10/11 0600 11/09/11 0510  AST 28 21 13  32 10  ALT 8 8 7 11 11   ALKPHOS 42 38* 38* 68 71  BILITOT 0.6 0.6 1.2 0.3 0.3  PROT 4.8* 4.6* 4.5* 7.8 7.8  ALBUMIN 1.8* 1.6* 1.7* 3.0* 3.1*     Lipase     Component Value Date/Time   LIPASE 38 11/07/2011 1700     Studies/Results: X-ray Chest Pa Or Ap  11/10/2011  *RADIOLOGY REPORT*  Clinical Data: Endotracheal tube placement  CHEST - 1 VIEW  Comparison: CT chest dated 10/07/2011  Findings: Endotracheal tube terminates 3 cm above the carina.  Enteric tube courses below the diaphragm.  Right chest power port and right arm PICC terminates in the  lower SVC / cavoatrial junction.  Right lung metastases, the largest measuring approximate 2.4 cm in the right lower lung.  Known left lung metastases are not well visualized radiographically.  Additional patchy right lower lobe opacity, likely atelectasis with possible small right pleural effusion.  No pneumothorax.  The heart is normal in size.  IMPRESSION: Endotracheal tube terminates 3 cm above the carina.  Additional support apparatus as above.  Right lung metastases.  Known left lung metastases are not well visualized radiographically.  Patchy right lower lobe opacity, likely atelectasis with a possible small right pleural effusion.   Original Report Authenticated By: Charline Bills, M.D.    Dg Chest Port 1 View  11/12/2011  *RADIOLOGY REPORT*  Clinical Data: Respiratory failure.  PORTABLE CHEST - 1 VIEW  Comparison: 11/11/2011.  Findings: Endotracheal tube, nasogastric tube and right subclavian central line appear unchanged.  Right subclavian Port-A-Cath is also present.  The right subclavian central line may represent a right upper extremity PICC.  Worsening pulmonary aeration is present.  Development of posteriorly layering left pleural effusion with associated atelectasis.  The lower lung volumes than on prior.  Persistent subsegmental atelectasis at the right base.  IMPRESSION:  1.  Unchanged support apparatus. 2.  A posteriorly layering left pleural effusion and atelectasis is new since yesterday's  exam.  Overall worsening pulmonary aeration compared yesterday.   Original Report Authenticated By: Andreas Newport, M.D.    Dg Chest Port 1 View  11/11/2011  *RADIOLOGY REPORT*  Clinical Data: Respiratory failure.  Ventilated patient.  PORTABLE CHEST - 1 VIEW  Comparison: 11/10/2011.  Findings:  Support apparatus appears stable, consisting of nasogastric tube, endotracheal tube, right subclavian power port and right upper extremity PICC.  Bilateral pulmonary nodules are again noted.  Basilar  atelectasis.  IMPRESSION: Stable support apparatus. Lower lung volumes with increasing basilar atelectasis.   Original Report Authenticated By: Andreas Newport, M.D.    Dg Chest Port 1 View  11/10/2011  *RADIOLOGY REPORT*  Clinical Data: 51 year old male intubated.  Acute respiratory failure. Metastatic rectal carcinoma.  PORTABLE CHEST - 1 VIEW  Comparison: 1517 hours the same day and earlier.  Findings: Portable semi upright AP view 1958 hours.  Endotracheal tube tip in good position between the level of clavicles and carina.  Stable right chest port and right PICC line/subclavian line.  Enteric tube courses to the abdomen, tip not included.  Stable lung volumes.  Multiple right side lung nodules/masses re- identified. Overall stable ventilation.  No pneumothorax or definite effusion.  Cardiac size and mediastinal contours are within normal limits.  IMPRESSION: 1.  Lines and tubes appear stable and appropriately positioned. 2.  Stable ventilation. 3.  Pulmonary metastatic disease.   Original Report Authenticated By: Harley Hallmark, M.D.     Medications:    . albumin human  25 g Intravenous Once  . albuterol  8 puff Inhalation Q6H  . antiseptic oral rinse  15 mL Mouth Rinse QID  . chlorhexidine  15 mL Mouth Rinse BID  . enoxaparin  40 mg Subcutaneous Q24H  . furosemide  40 mg Intravenous Once  . imipenem-cilastatin  500 mg Intravenous Q8H  . insulin aspart  0-15 Units Subcutaneous Q4H  . ipratropium  8 puff Inhalation Q6H  . metoprolol  5 mg Intravenous Q6H  . micafungin (MYCAMINE) IV  100 mg Intravenous Q24H  . pantoprazole (PROTONIX) IV  40 mg Intravenous QHS  . sodium chloride  500 mL Intravenous Once  . vancomycin  500 mg Intravenous Q12H  . DISCONTD: enoxaparin  30 mg Subcutaneous Q24H  . DISCONTD: imipenem-cilastatin  250 mg Intravenous Q6H    Assessment/Plan 1. 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  2. Rectal leak secondary to  recurrent cancer or anastomotic dehiscence; S/P. laparoscopic diverting loop descending colostomy, UA, rigid proctoscopy, biopsies. Biopsies have not shown reoccurrence of the cancer.  3. Possible small bowel obstruction, nonfunctional ostomy since discharge 10/10/11.  4. Percutaneous transgluteal abscess drain of intra abdominal abscess.  5. Recurrent obstruction 11/07/11  6. UTI 7.  Closed loop obstruction, s/p Diagnostic laparoscopy, exploratory laparotomy, small bowel resection x2, incidental appendectomy 11/10/11 Dr.Byerly 8. Sepsis, ongoing fever 9.Reanl insuffiencey, improving 10.VDRF/?aspiration pneumonia/CCM following  Plan:  Tachycardic, just extubated, will continue current RX.  Wound vac dressing changed, and it looks clean, will resume vac. Extubated, still poor cough, watching closely NG not working I am getting KUB to look for NG placement.   LOS: 5 days    Jeffery [name] 11/12/2011

## 2011-11-12 NOTE — Progress Notes (Addendum)
ANTIBIOTIC CONSULT NOTE  Pharmacy Consult for Vancomycin, Primaxin Indication: peritonitis  No Known Allergies  Patient Measurements: Height: 5\' 8"  (172.7 cm) Weight: 150 lb 12.7 oz (68.4 kg) [name] : 68.4   Vital Signs: Temp: 101.3 F (38.5 C) (09/27 1200) Temp src: Axillary (09/27 1200) BP: 100/56 mmHg (09/27 0600) Pulse Rate: 135  (09/27 0600) Intake/Output from previous day: 09/26 0701 - 09/27 0700 In: 4768 [I.V.:853; NG/GT:90; IV Piggyback:2750; TPN:1075] Out: 1673 [Urine:1478; Emesis/NG output:100; Drains:70; Stool:25]   Labs:  Basename 11/12/11 0450 11/11/11 1800 11/11/11 0353 11/10/11 2013  WBC 7.0 -- 2.7* 1.8*  HGB 9.3* -- 12.1* 12.1*  PLT 142* -- 236 252  LABCREA -- -- -- --  CREATININE 1.55* 2.10* 2.01* --   Estimated Creatinine Clearance: 53.9 ml/min (by C-G formula based on Cr of 1.55).  Microbiology: 9/22 urine culture: >100,000 Klebsiella pneumoniae (sensitive: cefazolin, ceftriaxone, cipro, gent, levo, zosyn, bactrim).  Abx courses: Ceftriaxone 9/22 > 9/24 Cefoxitin (pre-op abx), Zosyn, Fluconazole x 1 9/25 Vanc 9/25 >> Primaxin 9/25 >> Micafungin 9/25 >>  Assessment:  51 YOM on day #3 vancomycin, primaxin, and micafungin to cover for peritonitis and pneumonia in an immunosuppressed patient.  Serum creatinine is improved  WBC up wnl, Tm 101.3  Goal of Therapy:  Vancomycin trough level 15-20 mcg/ml Appropriate dose of Imipenem based on crcl  Plan:   Increase Primaxin to 500mg  IV q8h based on improved CrCl  No change to Vancomycin dose  VT in AM to eval current dose, will account for fluctuating Scr in dose adjustments  Gwen Her PharmD  [phone] 11/12/2011 1:08 PM   Addendum:  Assessement:  VT=6.3 mcg/ml after 500mg  IV q12h @ Css  Scr pending this am  Below desired goal of 15-20 mcg/ml.  Plan:  Increase Vancomycin to 750mg  IV q12h.  F/U SCr and levels as needed.  Lorenza Evangelist 11/13/2011 6:29 AM

## 2011-11-12 NOTE — Progress Notes (Signed)
PULMONARY/CCM CONSULT NOTE  Requesting MD/Service: Byerly/CCS Date of admission: 9/22 Date of consult: 9/25 Reason for consultation: post op VDRF  Pt Profile:  51 yobm with history of recurrent metastatic rectal cancer underwent exploratory laparotomy, small bowel resection x2, incidental appendectomy 9/25. Was thought to have aspirated in PACU post operatively with feculent emesis reported. Required reintubation in PACU. PCCM asked to assist with vent/CCM issues.  Lines, Tubes, etc: ETT 9/25 >> 9/27 RUE PICC 9/24 >>  R [place] Portacath (longstanding)    Microbiology: Urine 9/22 >> Klebs (pansens) PCT 9/26: >200  Antibiotics:  Fluconazole 9/25 >> 9/25 Zosyn 9/25 >> 9/25 Vanc 9/25 >> Micafungin 9/26>>> Imipenem 9/25>>>   Studies/Events:   Consults:  CCS (primary)   Best Practice: DVT: LMWH - dose adjusted for renal insuff SUP: PPI Nutrition: TPN Glycemic control: SSI  Sedation/analgesia: continuous sedation  SUBJ: Transient AFRVR 9/26 PM. Sinus tach now. Passed SBT. + F/C on WUA. Plan to extubate 9/27  Filed Vitals:   11/12/11 0500 11/12/11 0600 11/12/11 0755 11/12/11 0800  BP: 100/60 100/56    Pulse: 129 135    Temp:    101.3 F (38.5 C)  TempSrc:    Axillary  Resp: 14 16    Height:      Weight:      SpO2: 97% 97% 98%     EXAM:  Gen: WDWN, sedated and intubated, RASS -1, + F/C weakly HEENT: mm moist Neck: No JVD Lungs: clear anteriorly without wheezes Cardiovascular: RRR, no M, tachy up to140's Abdomen: moderately to severely distended, tympanitis, quiet, diffusely tender Musculoskeletal: normal development, no edema Neuro: MAEs, CNs intact, + F/C  CXR:  9/27 A posteriorly layering left pleural effusion and atelectasis is new since yesterday's exam. Overall worsening pulmonary aeration compared yesterday  BMET  Lab 11/12/11 0450 11/11/11 1800 11/11/11 0353 11/10/11 2013 11/10/11 0600  NA 140 140 138 138 135  K 4.6 5.2* -- -- --  CL 114* 114*  111 111 98  CO2 17* 17* 16* 19 29  GLUCOSE 127* 126* 97 171* 144*  BUN 27* 30* 23 18 10   CREATININE 1.55* 2.10* 2.01* 1.39* 0.57  CALCIUM 6.7* 6.6* 6.9* 6.8* 9.2  MG -- -- 1.2* -- 1.9  PHOS -- -- 3.3 -- 3.2   CBC  Lab 11/12/11 0450 11/11/11 0353 11/10/11 2013  HGB 9.3* 12.1* 12.1*  HCT 29.6* 38.1* 38.7*  WBC 7.0 2.7* 1.8*  PLT 142* 236 252     IMPRESSION:    Acute respiratory failure - post op abd surgery with ? aspiration, active smoker  ?Aspiration PNA -  PLAN -  Cont full vent support for now. Vent changes made  Cont daily WUA/ SBT Follow CXR  Sputum culture  Cont broad spectrum abx  Cont empiric BDs  Severe sepsis/occult shock -- r/t intra-abd contamination/peritonitis in OR Tachycardia - persistent despite B blocker which BP did not tolerate.  CVP=7 PLAN -  Gentle volume F/u CVP - goal 10-12    Peritonitis Aspiration PNA --  Pct>200.   PLAN -  Cont broad spectrum abx   F/u culture  F/u pct   Acute renal failure -- ATN in setting severe sepsis Lab Results  Component Value Date   CREATININE 1.55* 11/12/2011   CREATININE 2.10* 11/11/2011   CREATININE 2.01* 11/11/2011    Met acidosis-  Mild.  PLAN -  Maintain CVP 10-14 Monitor UOP Replete lytes PRN  F/u chem  Pharmacy to dose meds/ adjust TNA  S/p exp lap - small bowel resection with fecal contamination  Rectal ca  PLAN -  Surgery following Cont VAC TPN  NG tube  Abx as above    Brett Canales Minor ACNP Adolph Pollack PCCM Pager [phone] till 3 pm If no answer page [phone] 11/12/2011, 10:21 AM  I have interviewed and examined the patient and reviewed the database. I have formulated the assessment and plan as reflected in the note above with amendments made by me. 40 mins of direct critical care time provided. Extubate today. Discussed with Dr Valene Bors, MD;  PCCM service; Mobile [phone]

## 2011-11-12 NOTE — Consult Note (Signed)
WOC follow up First post op surgical VAC dressing, pt has existing ostomy Wound type: surgical Measurement: 13cm x 3cm x 1.5cm  Wound bed: beefy red, some darkening of the distal end of the wound bed, sutures visible. Drainage (amount, consistency, odor) none in canister Periwound:intact without problems Dressing procedure/placement/frequency: 1pc black granufoam placed in wound bed, covered with drape and seal obtained at .  Pt received IV pain med dressing change.   Ok for bedside nursing to change moving forward. Ostomy supplies ordered as well to bedside. Pt has been independent with ostomy care prior to this most recent hospital admission.    WOC team will follow as needed for wound and ostomy needs. Tanielle Emigh [place], [place] [phone]

## 2011-11-13 ENCOUNTER — Inpatient Hospital Stay (HOSPITAL_COMMUNITY): Payer: Medicaid Other

## 2011-11-13 DIAGNOSIS — J9600 Acute respiratory failure, unspecified whether with hypoxia or hypercapnia: Secondary | ICD-10-CM

## 2011-11-13 LAB — CBC
HCT: 31.4 % — ABNORMAL LOW (ref 39.0–52.0)
Hemoglobin: 10.2 g/dL — ABNORMAL LOW (ref 13.0–17.0)
WBC: 13.9 10*3/uL — ABNORMAL HIGH (ref 4.0–10.5)

## 2011-11-13 LAB — GLUCOSE, CAPILLARY
Glucose-Capillary: 111 mg/dL — ABNORMAL HIGH (ref 70–99)
Glucose-Capillary: 119 mg/dL — ABNORMAL HIGH (ref 70–99)
Glucose-Capillary: 116 mg/dL — ABNORMAL HIGH (ref 70–99)

## 2011-11-13 LAB — BASIC METABOLIC PANEL
CO2: 22 mEq/L (ref 19–32)
Calcium: 7.8 mg/dL — ABNORMAL LOW (ref 8.4–10.5)
Chloride: 111 mEq/L (ref 96–112)
Potassium: 3.5 mEq/L (ref 3.5–5.1)
Sodium: 142 mEq/L (ref 135–145)

## 2011-11-13 LAB — MAGNESIUM: Magnesium: 1.9 mg/dL (ref 1.5–2.5)

## 2011-11-13 LAB — VANCOMYCIN, TROUGH: Vancomycin Tr: 6.3 ug/mL — ABNORMAL LOW (ref 10.0–20.0)

## 2011-11-13 MED ORDER — ROCURONIUM BROMIDE 50 MG/5ML IV SOLN
INTRAVENOUS | Status: AC
  Filled 2011-11-13: qty 2

## 2011-11-13 MED ORDER — FENTANYL CITRATE 0.05 MG/ML IJ SOLN
INTRAMUSCULAR | Status: AC
  Administered 2011-11-13: 100 ug
  Filled 2011-11-13: qty 2

## 2011-11-13 MED ORDER — SUCCINYLCHOLINE CHLORIDE 20 MG/ML IJ SOLN
INTRAMUSCULAR | Status: AC
  Filled 2011-11-13: qty 10

## 2011-11-13 MED ORDER — CLINIMIX E/DEXTROSE (5/20) 5 % IV SOLN
INTRAVENOUS | Status: AC
  Administered 2011-11-13: 18:00:00 via INTRAVENOUS
  Filled 2011-11-13: qty 2000

## 2011-11-13 MED ORDER — LIDOCAINE HCL (CARDIAC) 20 MG/ML IV SOLN
INTRAVENOUS | Status: AC
  Filled 2011-11-13: qty 5

## 2011-11-13 MED ORDER — VECURONIUM BROMIDE 10 MG IV SOLR
7.0000 mg | Freq: Once | INTRAVENOUS | Status: AC
  Administered 2011-11-13: 7 mg via INTRAVENOUS

## 2011-11-13 MED ORDER — FENTANYL BOLUS VIA INFUSION
50.0000 ug | Freq: Four times a day (QID) | INTRAVENOUS | Status: DC | PRN
  Filled 2011-11-13: qty 100

## 2011-11-13 MED ORDER — ETOMIDATE 2 MG/ML IV SOLN
INTRAVENOUS | Status: AC
  Administered 2011-11-13: 20 mg
  Filled 2011-11-13: qty 20

## 2011-11-13 MED ORDER — POTASSIUM CHLORIDE 20 MEQ/15ML (10%) PO LIQD
40.0000 meq | Freq: Three times a day (TID) | ORAL | Status: AC
  Administered 2011-11-13 (×2): 40 meq
  Filled 2011-11-13 (×2): qty 30

## 2011-11-13 MED ORDER — MIDAZOLAM HCL 5 MG/ML IJ SOLN
INTRAMUSCULAR | Status: AC
  Administered 2011-11-13: 5 mg
  Filled 2011-11-13: qty 1

## 2011-11-13 MED ORDER — SODIUM CHLORIDE 0.9 % IV SOLN
50.0000 ug/h | INTRAVENOUS | Status: DC
  Administered 2011-11-13 – 2011-11-14 (×2): 75 ug/h via INTRAVENOUS
  Administered 2011-11-16: 50 ug/h via INTRAVENOUS
  Filled 2011-11-13 (×3): qty 50

## 2011-11-13 MED ORDER — FUROSEMIDE 10 MG/ML IJ SOLN
40.0000 mg | Freq: Three times a day (TID) | INTRAMUSCULAR | Status: AC
  Administered 2011-11-13 (×2): 40 mg via INTRAVENOUS
  Filled 2011-11-13 (×2): qty 4

## 2011-11-13 MED ORDER — VANCOMYCIN HCL 1000 MG IV SOLR
750.0000 mg | Freq: Once | INTRAVENOUS | Status: AC
  Administered 2011-11-13: 750 mg via INTRAVENOUS
  Filled 2011-11-13: qty 750

## 2011-11-13 MED ORDER — VANCOMYCIN HCL 1000 MG IV SOLR
750.0000 mg | Freq: Two times a day (BID) | INTRAVENOUS | Status: DC
  Administered 2011-11-13 – 2011-11-14 (×3): 750 mg via INTRAVENOUS
  Filled 2011-11-13 (×4): qty 750

## 2011-11-13 NOTE — Consult Note (Signed)
[facility] INPATIENT PROGRESS NOTE  Name: Jeffery [name]      MRN: [id number]    Location: 1228/1228-01  Date: 11/13/2011 Time:2:25 PM   Subjective: Interval History:Jeffery [name] was sedated.  Intubated.  He underwent on 11/10/2011 small bowel resection with path showing met adenocarcinoma consistent with colorectal primary.  He was extubated on Friday 11/12/11.  However, today, he was having respiratory failure.  Patient told PCCM Dr. Benjamine Sprague and his cousin that he did not want to be put back on life support.  However, patient's mother and his girlfriend asked that he be placed on the vent again.      Objective: Vital signs in last 24 hours: Temp:  [98.7 F (37.1 C)-100.5 F (38.1 C)] 99.2 F (37.3 C) (09/28 1100) Pulse Rate:  [122-146] 124  (09/28 1210) Resp:  [19-41] 21  (09/28 1210) BP: (109-169)/(83-113) 109/87 mmHg (09/28 1210) SpO2:  [88 %-100 %] 97 % (09/28 1210) FiO2 (%):  [70 %-100 %] 70 % (09/28 1400)    Intake/Output from previous day: 09/27 0701 - 09/28 0700 In: 2190 [I.V.:440] Out: 4595 [Urine:4550; Drains:10]     PHYSICAL EXAM:  Gen: cachetic man,  in no acute distress.  Lungs showed bilateral crackles.  CV;  RRR, S1S2, no m/r/g/.  Abdomen mildly distended, wound vac in place.  There was no pedal edema.     Studies/Results: reviewed.    MEDICATIONS:reviewed.     Assessment/Plan:  51 year-old man with metastatic rectal cancer (K-ras mutation positive)  with met to lungs, and recently to small bowel, causing small bowel obstruction.  He is s/p small bowel resection on 11/10/2011.  He now has respiratory failure from either bilateral pneumonia or aspiration pneumonia.  He is now intubated again against his wish which he expressed to PCCM team and his cousin.  He did not inform his mother or his girlfriend of his stage IV, uncurable rectal cancer and his grave prognosis.  His mother and girlfriend wished that he was intubated.   I spent time with the  family today.  I explained the incurable nature of his disease.  The last CT scan in 09/2011 showed that he had disease progression compared to prior.  This was despite 2nd line chemo FOLFIRI/Avastin.  There are a couple of other single agent therapies; however, they have very low response rate and does not provide significantly, or clinically meaningful survival benefit.  I will defer the final decision re: feasibility of chemo treatment to Dr. Alcide Evener tomorrow.  The patient's cousin tried to explain to the patient's mother and girlfriend that patient did not want to prolong his suffering.  His girlfriend did not say anything.  However, his mother said that he did not want to see him suffer either.  However, it is difficult to let him go.   Therefore, I recommended palliative care consult to help family going through this difficult time and come up with a plan of care.  In the meanwhile, he is to remain on full code with empiric antibacterial/fungal coverage.

## 2011-11-13 NOTE — Procedures (Signed)
Intubation Procedure Note Jeffery [name] [id number] [birth year]  Procedure: Intubation Indications: Respiratory insufficiency  Procedure Details Consent: Risks of procedure as well as the alternatives and risks of each were explained to the (patient/caregiver).  Consent for procedure obtained. Time Out: Verified patient identification, verified procedure, site/side was marked, verified correct patient position, special equipment/implants available, medications/allergies/relevent history reviewed, required imaging and test results available.  Performed  Maximum sterile technique was used including antiseptics, gloves and hand hygiene.  MAC    Evaluation Hemodynamic Status: BP stable throughout; O2 sats: stable throughout Patient's Current Condition: stable Complications: No apparent complications Patient did tolerate procedure well. Chest X-ray ordered to verify placement.  CXR: pending.   Jeffery [name] 11/13/2011

## 2011-11-13 NOTE — Progress Notes (Signed)
PARENTERAL NUTRITION CONSULT NOTE   Pharmacy Consult for TPN Indication: Bowel obstruction  No Known Allergies  Patient Measurements: Height: 5\' 8"  (172.7 cm) Weight: 150 lb 12.7 oz (68.4 kg) [name] : 68.4  Usual Weight: 142 lbs  Vital Signs: Temp: 99.8 F (37.7 C) (09/28 0400) Temp src: Oral (09/28 0400) BP: 151/100 mmHg (09/28 0600) Pulse Rate: 131  (09/28 0600)  Intake/Output from previous day: 09/27 0701 - 09/28 0700 In: 1960 [I.V.:400; NG/GT:180; IV Piggyback:500; TPN:880] Out: 4595 [Urine:4550; Drains:10; Stool:35]  Labs:  [facility] 11/13/11 0515 11/12/11 0450 11/11/11 0353  WBC 13.9* 7.0 2.7*  HGB 10.2* 9.3* 12.1*  HCT 31.4* 29.6* 38.1*  PLT 134* 142* 236  APTT -- -- --  INR -- 1.76* --     Basename 11/13/11 0515 11/12/11 0450 11/11/11 1800 11/11/11 0353 11/10/11 2013  NA 142 140 140 -- --  K 3.5 4.6 5.2* -- --  CL 111 114* 114* -- --  CO2 22 17* 17* -- --  GLUCOSE 127* 127* 126* -- --  BUN 25* 27* 30* -- --  CREATININE 0.98 1.55* 2.10* -- --  LABCREA -- -- -- -- --  CREAT24HRUR -- -- -- -- --  CALCIUM 7.8* 6.7* 6.6* -- --  MG 1.9 -- -- 1.2* --  PHOS -- -- -- 3.3 --  PROT -- 4.8* -- 4.6* 4.5*  ALBUMIN -- 1.8* -- 1.6* 1.7*  AST -- 28 -- 21 13  ALT -- 8 -- 8 7  ALKPHOS -- 42 -- 38* 38*  BILITOT -- 0.6 -- 0.6 1.2  BILIDIR -- -- -- -- --  IBILI -- -- -- -- --  PREALBUMIN -- -- -- -- --  TRIG -- -- -- -- --  CHOLHDL -- -- -- -- --  CHOL -- -- -- -- --   Estimated Creatinine Clearance: 85.3 ml/min (by C-G formula based on Cr of 0.98).    Basename 11/13/11 0419 11/13/11 0005 11/12/11 2012  GLUCAP 111* 116* 120*   CBGs & Insulin requirements past 24 hours:  0 units of SSI provided yesterday  Nutritional Goals:   RD recs 9/23 :   Kcal 1700-1900, Protein 80-90 gm  Clinimix E 5/20 at goal rate of 70 ml/hr + Lipids MWF to provide: 84 g/day protein and avg. 1684 Kcal/day weekly (1958 Kcal/day MWF, 1478 Kcal/day STTHS).  Current  nutrition:   Diet: NPO  Clinimix E 5/20 at 40 ml/hr  IVF: NS at 10-20 ml/hr  Assessment:   89 YOM with history of recurrent metastatic rectal cancer underwent exploratory laparotomy, small bowel resection x2, incidental appendectomy 9/25.  Pt thought to have aspirated in PACU post-op and was reintubated and transferred to ICU    TNA started 9/24. Rate increased 9/25 pm but had to be decreased 9/26 pm d/t ARF.  Renal: SCr was increasing, ARF, now improving  LFTs: WNL 9/26  Electrolytes: Mag, K, Corr Ca WNL  Pre-Albumin: 13.1 (9/24), 11.8 (9/25)  TG/Cholesterol: WNL 9/26  CBG's: controlled (<150 mg/dL) -  SSI  moderate scale.    Plan:  Increase Clinimix E 5/20 rate to 60 ml/hr.  SCr is improving, but will monitor that improvement is consistent.   Fat emulsion at 10 ml/hr (MWF only due to ongoing shortage). TNA to contain standard multivitamins and trace elements (MWF only due to ongoing shortage). Deferring IVF management to MD. Continue moderate SSI q4h. TNA lab panels on Mondays & Thursdays. BMET tomorrow to evaluate renal function and electrolytes with rate change.  Lynann Beaver PharmD, BCPS Pager [phone] 11/13/2011 7:33 AM

## 2011-11-13 NOTE — Plan of Care (Signed)
Problem: Phase II Progression Outcomes Goal: Time pt extubated/weaned off vent Outcome: Completed/Met Date Met:  11/13/11 11/12/2011 1200

## 2011-11-13 NOTE — Progress Notes (Signed)
General Surgery Note  LOS: 6 days  POD# 3 Room - 1228  Assessment/Plan: 1.  LAPAROSCOPIC LYSIS OF ADHESIONS, EXPLORATORY LAPAROTOMY, Small bower resection x 2, APPLICATION OF WOUND VAC -  Dr. Marilynne Halsted - 11/10/2011  On Primaxin/Vanc.  Has NGT - ileus.  Has wound VAC that is changed M/W/F.  2.  Laparoscopic loop descending diverting colostomy - Dr. Marilynne Halsted - 10/04/2101  3.  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 Byerly   Dr. Leonard Schwartz. [place] treating oncologist. 4.  Tobacco abuse  5.  Acute respiratory failure following trauma and surgery.  Aspiration pneumonia   Extubated yesterday, but still breathing about 40 times per minute  There is a question whether he will fly without being re-intubated.  Worsening air space disease on CXR today.  Discussed with Dr. Molli Knock.  Wife at bedside.  Patient thinking about not getting re-intubated.  6.  Sepsis - controlled ?  WBC has gone up some. 7.  Acute renal insufficiency - improved  Creatinine - 0.98 - 11/13/2011 8.  Malnutrition  On TPN 9.  DVT proph - on Lovenox 10.  Right buttocks drain   Not much coming out  Subjective:  Awake and alert.  Appears to answer questions appropriately. Objective:   Filed Vitals:   11/13/11 0800  BP: 141/94  Pulse: 137  Temp: 100.3 F (37.9 C)  Resp: 39     Intake/Output from previous day:  2022/12/01 0701 - 09/28 0700 In: 2150 [I.V.:400; NG/GT:180; IV Piggyback:650; TPN:920] Out: 4595 [Urine:4550; Drains:10; Stool:35]  Intake/Output this shift:  Total I/O In: 70 [I.V.:20; TPN:50] Out: 125 [Urine:125]   Physical Exam:   General: WN older AA M who is alert.    HEENT: Normal. Pupils equal. .   Lungs: Poor inspiratory effort.  RR at about 40.   Abdomen: Tight.  No BS.   Wound: VAC on wound.  LLQ colostomy.   Neurologic:  Grossly intact to motor and sensory function.   Lab Results:    [facility] 11/13/11  0515 12/01/2011 0450  WBC 13.9* 7.0  HGB 10.2* 9.3*  HCT 31.4* 29.6*  PLT 134* 142*    BMET   Basename 11/13/11 0515 2011/12/01 0450  NA 142 140  K 3.5 4.6  CL 111 114*  CO2 22 17*  GLUCOSE 127* 127*  BUN 25* 27*  CREATININE 0.98 1.55*  CALCIUM 7.8* 6.7*    PT/INR   Basename 12/01/2011 0450  LABPROT 19.9*  INR 1.76*    ABG   Basename 11/10/11 2028 11/10/11 1753  PHART 7.298* 7.219*  HCO3 16.8* 18.5*     Studies/Results:  Dg Abd 1 View  12/01/11  *RADIOLOGY REPORT*  Clinical Data: Nasogastric tube placement.  PORTABLE ABDOMEN - 1 VIEW  Comparison: Portable abdomen x-rays 11/09/2011, 11/08/2011.  Findings: Nasogastric tube tip overlies the expected location of the distal body of the stomach.  Bowel gas pattern much improved from prior examinations.  IMPRESSION: Nasogastric tube tip at the expected location of the distal body of the stomach.   Original Report Authenticated By: Arnell Sieving, M.D.    Dg Chest Port 1 View  11/13/2011  *RADIOLOGY REPORT*  Clinical Data: Respiratory failure.  PORTABLE CHEST - 1 VIEW  Comparison: Chest x-ray 01-Dec-2011.  Findings: A nasogastric tube is seen extending into the stomach, however, the tip of the nasogastric tube extends below the lower margin of the image.  Endotracheal tube has been removed.  Right subclavian single lumen power Port-A-Cath with tip terminating either at the superior cavoatrial junction or within the right atrium (right upper extremity PICC overlaps with this, and the tip of the PICC may be in either location as well).  Compared to the prior examination there is worsening patchy multifocal interstitial and airspace disease throughout all aspects of the lungs bilaterally, concerning for severe multilobar pneumonia.  Moderate left pleural effusion is similar.  Pulmonary vasculature is now completely obscured.  Heart size is normal.  Mediastinal contours are largely obscured by overlying air space disease.  IMPRESSION: 1.   Support apparatus, as above. 2.  Significant worsening of severe multi focal interstitial and airspace disease, as above, concerning for multilobar pneumonia. Some component of underlying pulmonary edema is possible, but is not favored to account for the majority of the findings. 3.  Moderate left pleural effusion is similar.   Original Report Authenticated By: Florencia Reasons, M.D.    Dg Chest Port 1 View  11/12/2011  *RADIOLOGY REPORT*  Clinical Data: Respiratory failure.  PORTABLE CHEST - 1 VIEW  Comparison: 11/11/2011.  Findings: Endotracheal tube, nasogastric tube and right subclavian central line appear unchanged.  Right subclavian Port-A-Cath is also present.  The right subclavian central line may represent a right upper extremity PICC.  Worsening pulmonary aeration is present.  Development of posteriorly layering left pleural effusion with associated atelectasis.  The lower lung volumes than on prior.  Persistent subsegmental atelectasis at the right base.  IMPRESSION:  1.  Unchanged support apparatus. 2.  A posteriorly layering left pleural effusion and atelectasis is new since yesterday's exam.  Overall worsening pulmonary aeration compared yesterday.   Original Report Authenticated By: Andreas Newport, M.D.      Anti-infectives:   Anti-infectives     Start     Dose/Rate Route Frequency Ordered Stop   11/13/11 1800   vancomycin (VANCOCIN) 750 mg in sodium chloride 0.9 % 150 mL IVPB        750 mg 150 mL/hr over 60 Minutes Intravenous Every 12 hours 11/13/11 0631     11/13/11 0645   vancomycin (VANCOCIN) 750 mg in sodium chloride 0.9 % 150 mL IVPB        750 mg 150 mL/hr over 60 Minutes Intravenous  Once 11/13/11 0631 11/13/11 0738   11/12/11 1200  imipenem-cilastatin (PRIMAXIN) 500 mg in sodium chloride 0.9 % 100 mL IVPB       500 mg 200 mL/hr over 30 Minutes Intravenous 3 times per day 11/12/11 1015     11/11/11 2000   micafungin (MYCAMINE) 100 mg in sodium chloride 0.9 % 100 mL  IVPB     Comments: stat      100 mg 100 mL/hr over 1 Hours Intravenous Every 24 hours 11/10/11 1939     11/11/11 1800   vancomycin (VANCOCIN) 500 mg in sodium chloride 0.9 % 100 mL IVPB  Status:  Discontinued        500 mg 100 mL/hr over 60 Minutes Intravenous Every 12 hours 11/11/11 1014 11/13/11 0631   11/10/11 2000   micafungin (MYCAMINE) 100 mg in sodium chloride 0.9 % 100 mL IVPB     Comments: stat      100 mg 100 mL/hr over 1 Hours Intravenous Daily 11/10/11 1939 11/10/11 2215   11/10/11 2000   imipenem-cilastatin (PRIMAXIN) 250 mg in sodium chloride 0.9 % 100 mL IVPB  Status:  Discontinued  250 mg 200 mL/hr over 30 Minutes Intravenous 4 times per day 11/10/11 1951 11/12/11 1014   11/10/11 1630   fluconazole (DIFLUCAN) IVPB 200 mg  Status:  Discontinued        200 mg 100 mL/hr over 60 Minutes Intravenous Every 24 hours 11/10/11 1617 11/10/11 1939   11/10/11 1630   vancomycin (VANCOCIN) 500 mg in sodium chloride 0.9 % 100 mL IVPB  Status:  Discontinued        500 mg 100 mL/hr over 60 Minutes Intravenous 3 times per day 11/10/11 1617 11/11/11 1014   11/10/11 1630   piperacillin-tazobactam (ZOSYN) IVPB 3.375 g  Status:  Discontinued        3.375 g 12.5 mL/hr over 240 Minutes Intravenous 3 times per day 11/10/11 1617 11/10/11 1941   11/10/11 0933   cefOXitin (MEFOXIN) 2 g in dextrose 5 % 50 mL IVPB        2 g 100 mL/hr over 30 Minutes Intravenous 60 min pre-op 11/10/11 0926 11/10/11 1105   11/10/11 0930   cefOXitin (MEFOXIN) 1 g in dextrose 5 % 50 mL IVPB  Status:  Discontinued        1 g 100 mL/hr over 30 Minutes Intravenous 3 times per day 11/10/11 0924 11/10/11 0926   11/07/11 2200   cefTRIAXone (ROCEPHIN) 1 g in dextrose 5 % 50 mL IVPB  Status:  Discontinued        1 g 100 mL/hr over 30 Minutes Intravenous Every 24 hours 11/07/11 2124 11/10/11 1536   11/07/11 1830   cefTRIAXone (ROCEPHIN) 1 g in dextrose 5 % 50 mL IVPB        1 g 100 mL/hr over 30 Minutes  Intravenous  Once 11/07/11 1819 11/07/11 1920          Ovidio Kin, MD, FACS Pager: [phone],   [facility] Office: [phone] 11/13/2011

## 2011-11-13 NOTE — Progress Notes (Signed)
This NP spoke with family at bedside.  Mother -Jeffery [name]  # [phone] Daughter [name] Girlfriend [name]  A meeting is schedule for tomrorw at 1000 am to discuss goals of care and options with Dr Sharl Ma  Other family members will be invited to meeting by patient mother.  Lorinda Creed NP

## 2011-11-13 NOTE — Progress Notes (Signed)
PULMONARY/CCM CONSULT NOTE  Requesting MD/Service: Byerly/CCS Date of admission: 9/22 Date of consult: 9/25 Reason for consultation: post op VDRF  Pt Profile:  51 yobm with history of recurrent metastatic rectal cancer underwent exploratory laparotomy, small bowel resection x2, incidental appendectomy 9/25. Was thought to have aspirated in PACU post operatively with feculent emesis reported. Required reintubation in PACU. PCCM asked to assist with vent/CCM issues.  Lines, Tubes, etc: ETT 9/25 >> 9/27 RUE PICC 9/24 >>  R [place] Portacath (longstanding)   Microbiology: Urine 9/22 >> Klebs (pansens) PCT 9/26: >200  Antibiotics:  Fluconazole 9/25 >> 9/25 Zosyn 9/25 >> 9/25 Vanc 9/25 >> Micafungin 9/26>>> Imipenem 9/25>>>  Studies/Events:  Consults:  CCS (primary)  Best Practice: DVT: LMWH - dose adjusted for renal insuff SUP: PPI Nutrition: TPN Glycemic control: SSI  Sedation/analgesia: continuous sedation  SUBJ: Transient AFRVR 9/26 PM. Sinus tach now. Passed SBT. + F/C on WUA. Plan to extubate 9/27  Filed Vitals:   11/13/11 0530 11/13/11 0600 11/13/11 0800 11/13/11 0818  BP:  151/100 141/94   Pulse: 146 131 137   Temp:   100.3 F (37.9 C)   TempSrc:   Axillary   Resp: 35 33 39   Height:      Weight:      SpO2: 92% 96% 96% 95%    EXAM:  Gen: WDWN, sedated and intubated, RASS -1, + F/C weakly HEENT: mm moist Neck: No JVD Lungs: clear anteriorly without wheezes Cardiovascular: RRR, no M, tachy up to140's Abdomen: moderately to severely distended, tympanitis, quiet, diffusely tender Musculoskeletal: normal development, no edema Neuro: MAEs, CNs intact, + F/C  CXR:  9/27 A posteriorly layering left pleural effusion and atelectasis is new since yesterday's exam. Overall worsening pulmonary aeration compared yesterday.  BMET  Lab 11/13/11 0515 11/12/11 0450 11/11/11 1800 11/11/11 0353 11/10/11 2013 11/10/11 0600  NA 142 140 140 138 138 --  K 3.5 4.6 -- -- --  --  CL 111 114* 114* 111 111 --  CO2 22 17* 17* 16* 19 --  GLUCOSE 127* 127* 126* 97 171* --  BUN 25* 27* 30* 23 18 --  CREATININE 0.98 1.55* 2.10* 2.01* 1.39* --  CALCIUM 7.8* 6.7* 6.6* 6.9* 6.8* --  MG 1.9 -- -- 1.2* -- 1.9  PHOS -- -- -- 3.3 -- 3.2   CBC  Lab 11/13/11 0515 11/12/11 0450 11/11/11 0353  HGB 10.2* 9.3* 12.1*  HCT 31.4* 29.6* 38.1*  WBC 13.9* 7.0 2.7*  PLT 134* 142* 236    Intake/Output Summary (Last 24 hours) at 11/13/11 0950 Last data filed at 11/13/11 0800  Gross per 24 hour  Intake   2100 ml  Output   4520 ml  Net  -2420 ml    IMPRESSION:    Acute respiratory failure - post op abd surgery with ? aspiration, active smoker. ?Aspiration PNA - Extubated and doing well. PLAN -  Extubated, in obvious respiratory failure, RR in the 40s on 100% NRB but patient does not want to be reintubated even if it means death. Gentle diureses as tolerated. Follow CXR. Sputum culture pending. Cont broad spectrum abx. Cont empiric BDs. Awaiting wife return to discuss code status.  Severe sepsis/occult shock -- BP more stable, HR increased due to work of breathing. PLAN -  KVO IVF. Gentle diureses today.  Peritonitis Aspiration PNA --  Pct>200.   PLAN -  Cont broad spectrum abx   F/u culture  F/u pct   Acute renal failure -- ATN  in setting severe sepsis Lab Results  Component Value Date   CREATININE 0.98 11/13/2011   CREATININE 1.55* 11/12/2011   CREATININE 2.10* 11/11/2011   Met acidosis-  Mild.  PLAN -  Maintain CVP 10-14 Monitor UOP Replete lytes PRN  F/u chem  Pharmacy to dose meds/ adjust TNA  S/p exp lap - small bowel resection with fecal contamination  Rectal ca  PLAN -  Surgery following Cont VAC TPN  NG tube  Abx as above   Spoke with patient, he does not wish for ET tube even if it means death.  His wife disagrees with him and we are currently awaiting his mother's arrival and getting in touch with oncology for final decision on code  status.  CC time 35 min.  Alyson Reedy, M.D. [facility] Pulmonary/Critical Care Medicine. Pager: [phone]. After hours pager: [phone].

## 2011-11-13 NOTE — Progress Notes (Signed)
Spoke with patient, mother and wife.  After discussion, patient decided to remain full code.  Will intubate.  Alyson Reedy, M.D. [facility] Pulmonary/Critical Care Medicine. Pager: [phone]. After hours pager: [phone].

## 2011-11-14 ENCOUNTER — Inpatient Hospital Stay (HOSPITAL_COMMUNITY): Payer: Medicaid Other

## 2011-11-14 DIAGNOSIS — K56609 Unspecified intestinal obstruction, unspecified as to partial versus complete obstruction: Principal | ICD-10-CM

## 2011-11-14 DIAGNOSIS — C7800 Secondary malignant neoplasm of unspecified lung: Secondary | ICD-10-CM

## 2011-11-14 DIAGNOSIS — C801 Malignant (primary) neoplasm, unspecified: Secondary | ICD-10-CM

## 2011-11-14 DIAGNOSIS — C20 Malignant neoplasm of rectum: Secondary | ICD-10-CM

## 2011-11-14 LAB — BLOOD GAS, ARTERIAL
MECHVT: 550 mL
O2 Saturation: 99.4 %
PEEP: 5 cmH2O
Patient temperature: 98.6
RATE: 16 resp/min
pH, Arterial: 7.479 — ABNORMAL HIGH (ref 7.350–7.450)

## 2011-11-14 LAB — GLUCOSE, CAPILLARY
Glucose-Capillary: 112 mg/dL — ABNORMAL HIGH (ref 70–99)
Glucose-Capillary: 117 mg/dL — ABNORMAL HIGH (ref 70–99)
Glucose-Capillary: 118 mg/dL — ABNORMAL HIGH (ref 70–99)

## 2011-11-14 LAB — BASIC METABOLIC PANEL
Chloride: 116 mEq/L — ABNORMAL HIGH (ref 96–112)
GFR calc Af Amer: 85 mL/min — ABNORMAL LOW (ref >90)
GFR calc non Af Amer: 73 mL/min — ABNORMAL LOW (ref >90)
Potassium: 4.2 mEq/L (ref 3.5–5.1)
Sodium: 144 mEq/L (ref 135–145)

## 2011-11-14 LAB — PROCALCITONIN: Procalcitonin: >175 ng/mL

## 2011-11-14 LAB — PHOSPHORUS: Phosphorus: 2 mg/dL — ABNORMAL LOW (ref 2.3–4.6)

## 2011-11-14 LAB — CBC
HCT: 26.6 % — ABNORMAL LOW (ref 39.0–52.0)
Hemoglobin: 8.7 g/dL — ABNORMAL LOW (ref 13.0–17.0)
RBC: 3.01 MIL/uL — ABNORMAL LOW (ref 4.22–5.81)

## 2011-11-14 LAB — MAGNESIUM: Magnesium: 1.8 mg/dL (ref 1.5–2.5)

## 2011-11-14 MED ORDER — MAGNESIUM SULFATE 40 MG/ML IJ SOLN
2.0000 g | Freq: Once | INTRAMUSCULAR | Status: AC
  Administered 2011-11-14: 2 g via INTRAVENOUS
  Filled 2011-11-14: qty 50

## 2011-11-14 MED ORDER — SODIUM PHOSPHATE 3 MMOLE/ML IV SOLN
30.0000 mmol | Freq: Once | INTRAVENOUS | Status: AC
  Administered 2011-11-14: 30 mmol via INTRAVENOUS
  Filled 2011-11-14: qty 10

## 2011-11-14 MED ORDER — CLINIMIX E/DEXTROSE (5/20) 5 % IV SOLN
INTRAVENOUS | Status: AC
  Administered 2011-11-14: 18:00:00 via INTRAVENOUS
  Filled 2011-11-14: qty 2000

## 2011-11-14 MED ORDER — FUROSEMIDE 10 MG/ML IJ SOLN
20.0000 mg | Freq: Three times a day (TID) | INTRAMUSCULAR | Status: AC
  Administered 2011-11-14 (×2): 20 mg via INTRAVENOUS
  Filled 2011-11-14 (×3): qty 2

## 2011-11-14 MED ORDER — ACETAMINOPHEN 160 MG/5ML PO SOLN
650.0000 mg | Freq: Four times a day (QID) | ORAL | Status: DC | PRN
Start: 2011-11-14 — End: 2011-11-22
  Administered 2011-11-14 – 2011-11-18 (×7): 650 mg
  Filled 2011-11-14 (×7): qty 20.3

## 2011-11-14 MED ORDER — ACETAMINOPHEN 325 MG PO TABS: 650.0000 mg | ORAL_TABLET | Freq: Four times a day (QID) | ORAL | Status: DC | PRN

## 2011-11-14 MED ORDER — POTASSIUM PHOSPHATE DIBASIC 3 MMOLE/ML IV SOLN
10.0000 mmol | Freq: Once | INTRAVENOUS | Status: DC
  Administered 2011-11-14: 10 mmol via INTRAVENOUS
  Filled 2011-11-14: qty 3.33

## 2011-11-14 MED ORDER — SODIUM CHLORIDE 0.9 % IV SOLN
250.0000 mg | Freq: Four times a day (QID) | INTRAVENOUS | Status: DC
  Administered 2011-11-14 – 2011-11-15 (×3): 250 mg via INTRAVENOUS
  Filled 2011-11-14 (×4): qty 250

## 2011-11-14 NOTE — Progress Notes (Signed)
PULMONARY/CCM CONSULT NOTE  Requesting MD/Service: Byerly/CCS Date of admission: 9/22 Date of consult: 9/25 Reason for consultation: post op VDRF  Pt Profile:  51 yobm with history of recurrent metastatic rectal cancer underwent exploratory laparotomy, small bowel resection x2, incidental appendectomy 9/25. Was thought to have aspirated in PACU post operatively with feculent emesis reported. Required reintubation in PACU. PCCM asked to assist with vent/CCM issues.  Lines, Tubes, etc: ETT 9/25 >> 9/27>>>9/28>>> RUE PICC 9/24 >>  R [place] Portacath (longstanding)   Microbiology: Urine 9/22 >> Klebs (pansens) PCT 9/26: >200  Antibiotics:  Fluconazole 9/25 >> 9/25 Zosyn 9/25 >> 9/25 Vanc 9/25>>> Micafungin 9/26>>> Imipenem 9/25>>>  Studies/Events:  Consults:  CCS (primary)  Best Practice: DVT: LMWH - dose adjusted for renal insuff SUP: PPI Nutrition: TPN Glycemic control: SSI  Sedation/analgesia: continuous sedation  SUBJ: Transient AFRVR 9/26 PM. Sinus tach now. Passed SBT. + F/C on WUA. Plan to extubate 9/27  Filed Vitals:   11/14/11 0600 11/14/11 0700 11/14/11 0800 11/14/11 0818  BP: 106/80 107/75 108/85   Pulse: 116 122 124   Temp:  100.3 F (37.9 C) 100.3 F (37.9 C)   TempSrc:  Oral Oral   Resp: 18 21 18    Height:      Weight:      SpO2: 97% 96% 97% 97%    EXAM:  Gen: WDWN, sedated and intubated, RASS -1, + F/C weakly HEENT: mm moist Neck: No JVD Lungs: clear anteriorly without wheezes Cardiovascular: RRR, no M, tachy up to140's Abdomen: moderately to severely distended, tympanitis, quiet, diffusely tender Musculoskeletal: normal development, no edema Neuro: MAEs, CNs intact, + F/C  CXR:  9/27 A posteriorly layering left pleural effusion and atelectasis is new since yesterday's exam. Overall worsening pulmonary aeration compared yesterday.  BMET  Lab 11/14/11 0500 11/13/11 0515 11/12/11 0450 11/11/11 1800 11/11/11 0353 11/10/11 0600  NA 144 142  140 140 138 --  K 4.2 3.5 -- -- -- --  CL 116* 111 114* 114* 111 --  CO2 24 22 17* 17* 16* --  GLUCOSE 127* 127* 127* 126* 97 --  BUN 35* 25* 27* 30* 23 --  CREATININE 1.13 0.98 1.55* 2.10* 2.01* --  CALCIUM 7.7* 7.8* 6.7* 6.6* 6.9* --  MG 1.8 1.9 -- -- 1.2* 1.9  PHOS 2.0* -- -- -- 3.3 3.2   CBC  Lab 11/14/11 0500 11/13/11 0515 11/12/11 0450  HGB 8.7* 10.2* 9.3*  HCT 26.6* 31.4* 29.6*  WBC 11.2* 13.9* 7.0  PLT 118* 134* 142*    Intake/Output Summary (Last 24 hours) at 11/14/11 1035 Last data filed at 11/14/11 1000  Gross per 24 hour  Intake   2753 ml  Output   2715 ml  Net     38 ml    IMPRESSION:    Acute respiratory failure - post op abd surgery with ? aspiration, active smoker. ?Aspiration PNA - Extubated and doing well. PLAN -  Reintubated on 9/28, tolerating vent well for now, continue full support no weaning. Gentle diureses as tolerated. Follow CXR. Cont broad spectrum abx. Cont empiric BDs.  Severe sepsis/occult shock -- BP more stable, HR increased due to work of breathing. PLAN -  KVO IVF. Gentle diureses today.  Peritonitis Aspiration PNA --  Pct>200.   PLAN -  Cont broad spectrum abx   F/u culture   Acute renal failure -- ATN in setting severe sepsis Lab Results  Component Value Date   CREATININE 1.13 11/14/2011   CREATININE 0.98 11/13/2011  CREATININE 1.55* 11/12/2011   Met acidosis-  Resolved, now with hypomag, hypophos. PLAN -  Maintain CVP 10-14 Monitor UOP Replete lytes PRN  F/u chem  Pharmacy to dose meds/ adjust TNA  S/p exp lap - small bowel resection with fecal contamination  Rectal ca  PLAN -  Surgery following Cont VAC TPN  NG tube  Abx as above   Palliative care met with the family and I spoke with Heme/Onc, patient's prognosis is very poor, the patient did not want to be intubated on 9/28 but wife and mother talked him into it.  After discussion with the family, the patient is not CPR and no cardioversion limited code  and family will return again on Wednesday and if patient is not improving then will likely terminally extubate.  CC time 35 min.  Alyson Reedy, M.D. [facility] Pulmonary/Critical Care Medicine. Pager: [phone]. After hours pager: [phone].

## 2011-11-14 NOTE — Progress Notes (Signed)
General Surgery Note  LOS: 7 days  POD# 4 Room - 1228  Assessment/Plan: Family to meet with palliative care today, 10 AM, about goals of care.  Overall prognosis is dismal.  1.  LAPAROSCOPIC LYSIS OF ADHESIONS, EXPLORATORY LAPAROTOMY, Small bower resection x 2, APPLICATION OF WOUND VAC -  Dr. Marilynne Halsted - 11/10/2011  On Primaxin/Vanc.  Has NGT - ileus.  Has wound VAC that is changed M/W/F.  2.  Laparoscopic loop descending diverting colostomy - Dr. Marilynne Halsted - 10/04/2101  3.  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 [facility]   Dr. Leonard Schwartz. [place] treating oncologist. 4.  Tobacco abuse  5.  Acute respiratory failure following trauma and surgery.  Aspiration pneumonia   Patient re-intubated. CXR slightly better than yesterday, though intubated.  6.  Sepsis - controlled ?  WBC - 11,200 - 11/14/2011 7.  Acute renal insufficiency - improved  Creatinine - 1.13 - 11/14/2011 8.  Malnutrition  On TPN 9.  DVT proph - on Lovenox 10.  Right buttocks drain   Not much coming out  Subjective:  Now intubated. Objective:   Filed Vitals:   11/14/11 0410  BP: 122/80  Pulse: 130  Temp:   Resp: 21     Intake/Output from previous day:  09/28 0701 - 09/29 0700 In: 2479.5 [I.V.:671.5; NG/GT:180; IV Piggyback:468; TPN:1160] Out: 2180 [Urine:2165; Drains:15]  Intake/Output this shift:  Total I/O In: 1015.5 [I.V.:265.5; IV Piggyback:210; TPN:540] Out: 815 [Urine:815]   Physical Exam:   General: WN older AA M who is intubated and sedated.    HEENT: Normal. Pupils equal. .   Lungs: Intubated.   Abdomen: Tight.  No BS.   Wound: VAC on wound.  LLQ colostomy.  No output.   Lab Results:     Basename 11/14/11 0500 11/13/11 0515  WBC 11.2* 13.9*  HGB 8.7* 10.2*  HCT 26.6* 31.4*  PLT 118* 134*    BMET    Basename 11/14/11 0500 11/13/11 0515  NA 144 142  K 4.2 3.5  CL 116* 111  CO2 24 22  GLUCOSE  127* 127*  BUN 35* 25*  CREATININE 1.13 0.98  CALCIUM 7.7* 7.8*    PT/INR    Basename November 24, 2011 0450  LABPROT 19.9*  INR 1.76*    ABG  No results found for this basename: PHART:2,PCO2:2,PO2:2,HCO3:2 in the last 72 hours   Studies/Results:  Dg Abd 1 View  2011-11-24  *RADIOLOGY REPORT*  Clinical Data: Nasogastric tube placement.  PORTABLE ABDOMEN - 1 VIEW  Comparison: Portable abdomen x-rays 11/09/2011, 11/08/2011.  Findings: Nasogastric tube tip overlies the expected location of the distal body of the stomach.  Bowel gas pattern much improved from prior examinations.  IMPRESSION: Nasogastric tube tip at the expected location of the distal body of the stomach.   Original Report Authenticated By: Arnell Sieving, M.D.    Dg Chest Port 1 View  11/13/2011  *RADIOLOGY REPORT*  Clinical Data: Evaluate endotracheal tube placement.  PORTABLE CHEST - 1 VIEW  Comparison: Earlier today at 0530 hours.  Findings: 1139 hours.  Endotracheal tube terminates 4.7 cm above carina.  Nasogastric tube extends beyond the  inferior aspect of the film.  Right-sided Port-A-Cath unchanged.  Normal heart size.  Similar small left pleural effusion. No pneumothorax.  Interstitial and airspace disease is not significantly changed and most confluent at the left lung base.  IMPRESSION:  1.  Appropriate  position of endotracheal tube, 4.8 cm above carina. 2.  Otherwise, similar interstitial and airspace disease.  Favor multifocal pneumonia. 3.  Similar left pleural effusion.   Original Report Authenticated By: Consuello Bossier, M.D.    Dg Chest Port 1 View  11/13/2011  *RADIOLOGY REPORT*  Clinical Data: Respiratory failure.  PORTABLE CHEST - 1 VIEW  Comparison: Chest x-ray 11/12/2011.  Findings: A nasogastric tube is seen extending into the stomach, however, the tip of the nasogastric tube extends below the lower margin of the image.  Endotracheal tube has been removed.  Right subclavian single lumen power Port-A-Cath with tip  terminating either at the superior cavoatrial junction or within the right atrium (right upper extremity PICC overlaps with this, and the tip of the PICC may be in either location as well).  Compared to the prior examination there is worsening patchy multifocal interstitial and airspace disease throughout all aspects of the lungs bilaterally, concerning for severe multilobar pneumonia.  Moderate left pleural effusion is similar.  Pulmonary vasculature is now completely obscured.  Heart size is normal.  Mediastinal contours are largely obscured by overlying air space disease.  IMPRESSION: 1.  Support apparatus, as above. 2.  Significant worsening of severe multi focal interstitial and airspace disease, as above, concerning for multilobar pneumonia. Some component of underlying pulmonary edema is possible, but is not favored to account for the majority of the findings. 3.  Moderate left pleural effusion is similar.   Original Report Authenticated By: Florencia Reasons, M.D.      Anti-infectives:   Anti-infectives     Start     Dose/Rate Route Frequency Ordered Stop   11/13/11 1800   vancomycin (VANCOCIN) 750 mg in sodium chloride 0.9 % 150 mL IVPB        750 mg 150 mL/hr over 60 Minutes Intravenous Every 12 hours 11/13/11 0631     11/13/11 0645   vancomycin (VANCOCIN) 750 mg in sodium chloride 0.9 % 150 mL IVPB        750 mg 150 mL/hr over 60 Minutes Intravenous  Once 11/13/11 0631 11/13/11 0738   11/12/11 1200   imipenem-cilastatin (PRIMAXIN) 500 mg in sodium chloride 0.9 % 100 mL IVPB        500 mg 200 mL/hr over 30 Minutes Intravenous 3 times per day 11/12/11 1015     11/11/11 2000   micafungin (MYCAMINE) 100 mg in sodium chloride 0.9 % 100 mL IVPB     Comments: stat      100 mg 100 mL/hr over 1 Hours Intravenous Every 24 hours 11/10/11 1939     11/11/11 1800   vancomycin (VANCOCIN) 500 mg in sodium chloride 0.9 % 100 mL IVPB  Status:  Discontinued        500 mg 100 mL/hr over 60 Minutes  Intravenous Every 12 hours 11/11/11 1014 11/13/11 0631   11/10/11 2000   micafungin (MYCAMINE) 100 mg in sodium chloride 0.9 % 100 mL IVPB     Comments: stat      100 mg 100 mL/hr over 1 Hours Intravenous Daily 11/10/11 1939 11/10/11 2215   11/10/11 2000   imipenem-cilastatin (PRIMAXIN) 250 mg in sodium chloride 0.9 % 100 mL IVPB  Status:  Discontinued        250 mg 200 mL/hr over 30 Minutes Intravenous 4 times per day 11/10/11 1951 11/12/11 1014   11/10/11 1630   fluconazole (DIFLUCAN) IVPB 200 mg  Status:  Discontinued  200 mg 100 mL/hr over 60 Minutes Intravenous Every 24 hours 11/10/11 1617 11/10/11 1939   11/10/11 1630   vancomycin (VANCOCIN) 500 mg in sodium chloride 0.9 % 100 mL IVPB  Status:  Discontinued        500 mg 100 mL/hr over 60 Minutes Intravenous 3 times per day 11/10/11 1617 11/11/11 1014   11/10/11 1630   piperacillin-tazobactam (ZOSYN) IVPB 3.375 g  Status:  Discontinued        3.375 g 12.5 mL/hr over 240 Minutes Intravenous 3 times per day 11/10/11 1617 11/10/11 1941   11/10/11 0933   cefOXitin (MEFOXIN) 2 g in dextrose 5 % 50 mL IVPB        2 g 100 mL/hr over 30 Minutes Intravenous 60 min pre-op 11/10/11 0926 11/10/11 1105   11/10/11 0930   cefOXitin (MEFOXIN) 1 g in dextrose 5 % 50 mL IVPB  Status:  Discontinued        1 g 100 mL/hr over 30 Minutes Intravenous 3 times per day 11/10/11 0924 11/10/11 0926   11/07/11 2200   cefTRIAXone (ROCEPHIN) 1 g in dextrose 5 % 50 mL IVPB  Status:  Discontinued        1 g 100 mL/hr over 30 Minutes Intravenous Every 24 hours 11/07/11 2124 11/10/11 1536   11/07/11 1830   cefTRIAXone (ROCEPHIN) 1 g in dextrose 5 % 50 mL IVPB        1 g 100 mL/hr over 30 Minutes Intravenous  Once 11/07/11 1819 11/07/11 1920          Ovidio Kin, MD, FACS Pager: [phone],   [facility] Office: [phone]

## 2011-11-14 NOTE — Progress Notes (Signed)
PARENTERAL NUTRITION CONSULT NOTE   Pharmacy Consult for TPN Indication: Bowel obstruction  No Known Allergies  Patient Measurements: Height: 5\' 8"  (172.7 cm) Weight: 140 lb 6.9 oz (63.7 kg) [name] : 68.4  Usual Weight: 142 lbs  Vital Signs: Temp: 100.3 F (37.9 C) (09/29 0800) Temp src: Oral (09/29 0800) BP: 107/75 mmHg (09/29 0700) Pulse Rate: 122  (09/29 0700)  Intake/Output from previous day: 09/28 0701 - 09/29 0700 In: 3023 [I.V.:760; NG/GT:205; IV Piggyback:718; TPN:1340] Out: 2840 [Urine:2825; Drains:15]  Labs:  [facility] 11/14/11 0500 11/13/11 0515 11/12/11 0450  WBC 11.2* 13.9* 7.0  HGB 8.7* 10.2* 9.3*  HCT 26.6* 31.4* 29.6*  PLT 118* 134* 142*  APTT -- -- --  INR -- -- 1.76*     Basename 11/14/11 0500 11/13/11 0515 11/12/11 0450  NA 144 142 140  K 4.2 3.5 4.6  CL 116* 111 114*  CO2 24 22 17*  GLUCOSE 127* 127* 127*  BUN 35* 25* 27*  CREATININE 1.13 0.98 1.55*  LABCREA -- -- --  CREAT24HRUR -- -- --  CALCIUM 7.7* 7.8* 6.7*  MG 1.8 1.9 --  PHOS 2.0* -- --  PROT -- -- 4.8*  ALBUMIN -- -- 1.8*  AST -- -- 28  ALT -- -- 8  ALKPHOS -- -- 42  BILITOT -- -- 0.6  BILIDIR -- -- --  IBILI -- -- --  PREALBUMIN -- -- --  TRIG -- -- --  CHOLHDL -- -- --  CHOL -- -- --   Estimated Creatinine Clearance: 68.9 ml/min (by C-G formula based on Cr of 1.13).    Basename 11/14/11 0734 11/14/11 0323 11/14/11 0009  GLUCAP 128* 117* 118*   CBGs & Insulin requirements past 24 hours:  6 units of SSI   Nutritional Goals:   RD recs 9/23 :   Kcal 1700-1900, Protein 80-90 gm  Clinimix E 5/20 at goal rate of 70 ml/hr + Lipids MWF to provide: 84 g/day protein and avg. 1684 Kcal/day weekly (1958 Kcal/day MWF, 1478 Kcal/day STTHS).  Current nutrition:   Diet: NPO  Clinimix E 5/20 at 60 ml/hr  IVF: NS at 10-20 ml/hr  Assessment:   [name] with history of recurrent metastatic rectal cancer underwent exploratory laparotomy, small bowel resection  x2, incidental appendectomy 9/25.  Pt thought to have aspirated in PACU post-op and was reintubated and transferred to ICU    TNA started 9/24. Rate increased 9/25 pm but had to be decreased 9/26 pm d/t ARF.  Renal: ARF, was improving, SCr increased today.    LFTs: WNL 9/26  Electrolytes: Mag, K, Corr Ca WNL, Phos low  Pre-Albumin: 13.1 (9/24), 11.8 (9/25)  TG/Cholesterol: WNL 9/26  CBG's: controlled (<150 mg/dL) -  SSI  moderate scale.    Plan:  Continue Clinimix E 5/20 at 60 ml/hr.  SCr is slightly increased, but will monitor that improvement is consistent.   Fat emulsion at 10 ml/hr (MWF only due to ongoing shortage). TNA to contain standard multivitamins and trace elements (MWF only due to ongoing shortage). Deferring IVF management to MD. KPhos IV x1 today Continue moderate SSI q4h. TNA lab panels on Mondays & Thursdays.   Lynann Beaver PharmD, BCPS Pager [phone] 11/14/2011 9:08 AM

## 2011-11-14 NOTE — Consult Note (Signed)
Patient ZO:XWRUE TRYSON [name]      DOB: Mar 30, 1960      AVW:[id number]     Consult Note from the Palliative Medicine Team at [facility]    Consult Requested by: Dr Gaylyn Rong     PCP: No primary provider on file. Reason for Consultation: Goals of care     Phone Number:None  Assessment of patients Current state: 51 y/o male with rectal  metatstatic cancer, with metatstases to lung, developed respiratory failure secondary to aspiration pneumonia, and now intubated on mechanical ventilation. Family understands the poor prognoses but mother is having hard time with letting go. They have decided to make him partial code with no CPR and no defibrillation, but would like to continue with ventilatory support. They would like to re meet on Wednesday to reassess the situation and possible extubation if he has not made any progress. They do not want tracheostomy and long term mechanical ventilation.  Goals of Care: 1.  Code Status: Partial code , No CPR, No defibrillation.   2. Scope of Treatment: 1. Vital Signs: per protocol  2. Respiratory/Oxygen: Intubated, on mechanical ventilation 3. Nutritional Support/Tube Feeds: Patient on TNA 4. Antibiotics: Continue as needed 5. Blood Products:  as needed 6. IVF: Continue as needed 7. Review of Medications to be discontinued: None 8. Labs: continue as needed        4. Psychosocial: Patient's mother ,  girlfriend, daughter, cousins are all very supportive. Mother is the major Management consultant.  5. Spiritual: No spiritual needs at this time        Patient Documents Completed or Given: Document Given Completed  Advanced Directives Pkt    MOST    DNR    Gone from My Sight    Hard Choices      Brief HPI: 51 y/o male with h/o metastatic rectal cancer with mets to lungs, and small bowels causing small bowel obstruction, patient underwent small bowel resection on 9/25. Patient developed respiratory failure after aspiration pneumonia. Patient was  intubated, and got re intubated yesterday after the mother and girlfriend wanted him to be re intubated against his wishes. Patient is sedated at this time, goals of care meeting was done with patient's family, including his mother, daughter and girl friend, with all the cousins in the room. Discussed the code status, poor prognoses, long term ventilator support, tracheostomy.  ROS: Unobtainable    PMH:  Past Medical History  Diagnosis Date  . Hypertension     no meds  . Cancer     rectal   . 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  10/12/2011     PSH: Past Surgical History  Procedure Date  . Multiple tooth extractions     9/11  . Portacath placement 04/12/2011    Procedure: INSERTION PORT-A-CATH;  Surgeon: Almond Lint, MD;  Location: [facility];  Service: General;  Laterality: N/A;  port a cath insertion  . Colon surgery 12/11    sbo  . Colon surgery 8/12    colon resection-iliostomy  . Colon surgery 2012    iliostomy takedown  . Colostomy 10/05/2011    Procedure: COLOSTOMY;  Surgeon: Almond Lint, MD;  Location: [facility];  Service: General;  Laterality: N/A;  open colostomy  . Examination under anesthesia 10/05/2011    Procedure: EXAM UNDER ANESTHESIA;  Surgeon: Almond Lint, MD;  Location: [facility];  Service: General;  Laterality: N/A;  exam under anesthesia  . Proctoscopy 10/05/2011    Procedure: PROCTOSCOPY;  Surgeon: Almond Lint, MD;  Location: [facility];  Service: General;  Laterality: N/A;  rigid proctoscopy  . Laparoscopy 10/05/2011    Procedure: LAPAROSCOPY DIAGNOSTIC;  Surgeon: Almond Lint, MD;  Location: [facility];  Service: General;  Laterality: N/A;  . Laparotomy 11/10/2011    Procedure: EXPLORATORY LAPAROTOMY;  Surgeon: Almond Lint, MD;  Location: [facility];  Service: General;  Laterality: N/A;  SMALL BOWEL RESECTION x 2  . Application of wound vac 11/10/2011      Procedure: APPLICATION OF WOUND VAC;  Surgeon: Almond Lint, MD;  Location: [facility];  Service: General;;   I have reviewed the FH and SH and  If appropriate update it with new information. No Known Allergies Scheduled Meds:   . antiseptic oral rinse  15 mL Mouth Rinse QID  . chlorhexidine  15 mL Mouth Rinse BID  . furosemide  20 mg Intravenous Q8H  . furosemide  40 mg Intravenous Q8H  . imipenem-cilastatin  500 mg Intravenous Q8H  . insulin aspart  0-15 Units Subcutaneous Q4H  . levalbuterol  0.63 mg Nebulization Q6H  . lidocaine (cardiac) 100 mg/63ml      . magnesium sulfate 1 - 4 g bolus IVPB  2 g Intravenous Once  . metoprolol  5 mg Intravenous Q6H  . micafungin (MYCAMINE) IV  100 mg Intravenous Q24H  . pantoprazole (PROTONIX) IV  40 mg Intravenous QHS  . potassium chloride  40 mEq Per Tube TID  . rocuronium      . sodium phosphate  Dextrose 5% IVPB  30 mmol Intravenous Once  . succinylcholine      . vancomycin  750 mg Intravenous Q12H  . DISCONTD: enoxaparin  40 mg Subcutaneous Q24H  . DISCONTD: potassium phosphate IVPB (mmol)  10 mmol Intravenous Once   Continuous Infusions:   . sodium chloride 500 mL (11/14/11 0225)  . fat emulsion 240 mL (11/13/11 0700)  . fentaNYL infusion INTRAVENOUS 75 mcg/hr (11/12/11 0049)  . fentaNYL infusion INTRAVENOUS 75 mcg/hr (11/13/11 1154)  . midazolam (VERSED) infusion 2 mg/hr (11/13/11 2350)  . TPN (CLINIMIX) +/- additives 40 mL/hr at 11/12/11 1714  . TPN (CLINIMIX) +/- additives 60 mL/hr at 11/13/11 1808  . TPN (CLINIMIX) +/- additives     PRN Meds:.acetaminophen (TYLENOL) oral liquid 160 mg/5 mL, fentaNYL, fentaNYL, fentaNYL, hydrALAZINE, levalbuterol, metoprolol, midazolam, ondansetron, pneumococcal 23 valent vaccine, sodium chloride, sodium chloride, DISCONTD: acetaminophen    BP 106/76  Pulse 118  Temp 101.2 F (38.4 C) (Oral)  Resp 20  Ht 5\' 8"  (1.727 m)  Wt 63.7 kg (140 lb 6.9 oz)  BMI 21.35 kg/m2  SpO2  97%   Intake/Output Summary (Last 24 hours) at 11/14/11 1217 Last data filed at 11/14/11 1200  Gross per 24 hour  Intake 2971.5 ml  Output   2835 ml  Net  136.5 ml     Physical Exam:  General: Patient intubated, sedated HEENT:  intubated Chest: Clear bilaterally CVS: S1S2 RRR Abdomen: soft, nontender Ext: No edema Neuro: Sedated  Labs: CBC    Component Value Date/Time   WBC 11.2* 11/14/2011 0500   WBC 5.7 09/16/2011 0940   RBC 3.01* 11/14/2011 0500   RBC 3.57* 09/16/2011 0940   HGB 8.7* 11/14/2011 0500   HGB 11.3* 09/16/2011 0940   HCT 26.6* 11/14/2011 0500   HCT 33.8* 09/16/2011 0940   PLT 118* 11/14/2011 0500  PLT 304 09/16/2011 0940   MCV 88.4 11/14/2011 0500   MCV 94.7 09/16/2011 0940   MCH 28.9 11/14/2011 0500   MCH 31.8 09/16/2011 0940   MCHC 32.7 11/14/2011 0500   MCHC 33.6 09/16/2011 0940   RDW 15.5 11/14/2011 0500   RDW 14.5 09/16/2011 0940   LYMPHSABS 0.2* 11/10/2011 2013   LYMPHSABS 0.7* 09/16/2011 0940   MONOABS 0.3 11/10/2011 2013   MONOABS 0.9 09/16/2011 0940   EOSABS 0.0 11/10/2011 2013   EOSABS 0.0 09/16/2011 0940   BASOSABS 0.0 11/10/2011 2013   BASOSABS 0.0 09/16/2011 0940    BMET    Component Value Date/Time   NA 144 11/14/2011 0500   K 4.2 11/14/2011 0500   CL 116* 11/14/2011 0500   CO2 24 11/14/2011 0500   GLUCOSE 127* 11/14/2011 0500   BUN 35* 11/14/2011 0500   CREATININE 1.13 11/14/2011 0500   CALCIUM 7.7* 11/14/2011 0500   GFRNONAA 73* 11/14/2011 0500   GFRAA 85* 11/14/2011 0500    CMP     Component Value Date/Time   NA 144 11/14/2011 0500   K 4.2 11/14/2011 0500   CL 116* 11/14/2011 0500   CO2 24 11/14/2011 0500   GLUCOSE 127* 11/14/2011 0500   BUN 35* 11/14/2011 0500   CREATININE 1.13 11/14/2011 0500   CALCIUM 7.7* 11/14/2011 0500   PROT 4.8* 11/12/2011 0450   ALBUMIN 1.8* 11/12/2011 0450   AST 28 11/12/2011 0450   ALT 8 11/12/2011 0450   ALKPHOS 42 11/12/2011 0450   BILITOT 0.6 11/12/2011 0450   GFRNONAA 73* 11/14/2011 0500   GFRAA 85* 11/14/2011 0500     Time In  Time Out Total Time Spent with Patient Total Overall Time  10 am 11;30 am 30 min 90 min    Greater than 50%  of this time was spent counseling and coordinating care related to the above assessment and plan.

## 2011-11-14 NOTE — Progress Notes (Signed)
ANTIBIOTIC CONSULT NOTE  Pharmacy Consult for Vancomycin, Primaxin Indication: peritonitis, r/o aspiration pna  No Known Allergies  Patient Measurements: Height: 5\' 8"  (172.7 cm) Weight: 140 lb 6.9 oz (63.7 kg) [name] : 68.4   Vital Signs: Temp: 101.2 F (38.4 C) (09/29 1200) Temp src: Oral (09/29 1200) BP: 97/64 mmHg (09/29 1300) Pulse Rate: 121  (09/29 1300) Intake/Output from previous day: 09/28 0701 - 09/29 0700 In: 3023 [I.V.:760; NG/GT:205; IV Piggyback:718; TPN:1340] Out: 2840 [Urine:2825; Drains:15]   Labs:  [facility] 11/14/11 0500 11/13/11 0515 11/12/11 0450  WBC 11.2* 13.9* 7.0  HGB 8.7* 10.2* 9.3*  PLT 118* 134* 142*  LABCREA -- -- --  CREATININE 1.13 0.98 1.55*   Estimated Creatinine Clearance: 68.9 ml/min (by C-G formula based on Cr of 1.13).  Microbiology: 9/22 urine culture: >100,000 Klebsiella pneumoniae (sensitive: cefazolin, ceftriaxone, cipro, gent, levo, zosyn, bactrim).  Abx courses: Ceftriaxone 9/22 > 9/24 Cefoxitin (pre-op abx), Zosyn, Fluconazole x 1 9/25 Vanc 9/25 >> Primaxin 9/25 >> Micafungin 9/25 >>  Assessment:  51 YOM on day #5 vancomycin, primaxin, and micafungin to cover for peritonitis and pneumonia in an immunosuppressed patient.  Serum creatinine was improving, but is increased today.  CrCl ~ 69 ml/min  Goal of Therapy:  Vancomycin trough level 15-20 mcg/ml Appropriate dose of Imipenem based on crcl  Plan:   Change to Primaxin to 250mg  IV q6h based on renal function  Continue vancomycin 750mg  IV q12h  VT tonight to eval current dose, will account for fluctuating Scr in dose adjustments.  Lynann Beaver PharmD, BCPS Pager [phone] 11/14/2011 2:33 PM

## 2011-11-14 NOTE — Progress Notes (Signed)
Nutrition Follow-up  Intervention:    Pharmacy to dose TPN.  RD to follow.  Assessment:   Pt re-intubated 11/13/11. Currently on TPN: Clinimix E 5/20 at 60 mL/hr with 20% lipids MWF, which provides daily average of 1473kcal (77% estimated needs) and 72 gm protein (90% estimated needs). Noted goal rate of 70 mL/hr, which will provide daily average 1684 kcal (88% estimated needs) and 84 gm protein (100% estimated needs).  Diet Order:  NPO  Meds: Scheduled Meds:   . antiseptic oral rinse  15 mL Mouth Rinse QID  . chlorhexidine  15 mL Mouth Rinse BID  . furosemide  20 mg Intravenous Q8H  . furosemide  40 mg Intravenous Q8H  . imipenem-cilastatin  500 mg Intravenous Q8H  . insulin aspart  0-15 Units Subcutaneous Q4H  . levalbuterol  0.63 mg Nebulization Q6H  . lidocaine (cardiac) 100 mg/8ml      . magnesium sulfate 1 - 4 g bolus IVPB  2 g Intravenous Once  . metoprolol  5 mg Intravenous Q6H  . micafungin (MYCAMINE) IV  100 mg Intravenous Q24H  . pantoprazole (PROTONIX) IV  40 mg Intravenous QHS  . potassium chloride  40 mEq Per Tube TID  . rocuronium      . sodium phosphate  Dextrose 5% IVPB  30 mmol Intravenous Once  . succinylcholine      . vancomycin  750 mg Intravenous Q12H  . vecuronium  7 mg Intravenous Once  . DISCONTD: enoxaparin  40 mg Subcutaneous Q24H  . DISCONTD: potassium phosphate IVPB (mmol)  10 mmol Intravenous Once   Continuous Infusions:   . sodium chloride 500 mL (11/14/11 0225)  . fat emulsion 240 mL (11/13/11 0700)  . fentaNYL infusion INTRAVENOUS 75 mcg/hr (11/12/11 0049)  . fentaNYL infusion INTRAVENOUS 75 mcg/hr (11/13/11 1154)  . midazolam (VERSED) infusion 2 mg/hr (11/13/11 2350)  . TPN (CLINIMIX) +/- additives 40 mL/hr at 11/12/11 1714  . TPN (CLINIMIX) +/- additives 60 mL/hr at 11/13/11 1808  . TPN (CLINIMIX) +/- additives     PRN Meds:.fentaNYL, fentaNYL, fentaNYL, hydrALAZINE, levalbuterol, metoprolol, midazolam, ondansetron, pneumococcal 23  valent vaccine, sodium chloride, sodium chloride  Labs:  CMP     Component Value Date/Time   NA 144 11/14/2011 0500   K 4.2 11/14/2011 0500   CL 116* 11/14/2011 0500   CO2 24 11/14/2011 0500   GLUCOSE 127* 11/14/2011 0500   BUN 35* 11/14/2011 0500   CREATININE 1.13 11/14/2011 0500   CALCIUM 7.7* 11/14/2011 0500   PROT 4.8* 11/12/2011 0450   ALBUMIN 1.8* 11/12/2011 0450   AST 28 11/12/2011 0450   ALT 8 11/12/2011 0450   ALKPHOS 42 11/12/2011 0450   BILITOT 0.6 11/12/2011 0450   GFRNONAA 73* 11/14/2011 0500   GFRAA 85* 11/14/2011 0500     Intake/Output Summary (Last 24 hours) at 11/14/11 1101 Last data filed at 11/14/11 1000  Gross per 24 hour  Intake   2753 ml  Output   2715 ml  Net     38 ml    Weight Status:  63.7 kg  Re-estimated needs:   1900-2000 kcal 80-90 gm protein  Nutrition Dx:  Inadequate oral intake - ongoing  Goal: TPN to meet >90% of estimated nutritional needs.   Monitor:  Weights, labs, diet advancement   Terralyn Matsumura, Trinda Pascal RD, LDN

## 2011-11-15 ENCOUNTER — Ambulatory Visit: Payer: Medicaid Other | Admitting: Nurse Practitioner

## 2011-11-15 ENCOUNTER — Inpatient Hospital Stay (HOSPITAL_COMMUNITY): Payer: Medicaid Other

## 2011-11-15 LAB — DIFFERENTIAL
Basophils Absolute: 0.1 10*3/uL (ref 0.0–0.1)
Eosinophils Relative: 1 % (ref 0–5)
Lymphs Abs: 1.1 10*3/uL (ref 0.7–4.0)
Monocytes Absolute: 1.1 10*3/uL — ABNORMAL HIGH (ref 0.1–1.0)
Monocytes Relative: 10 % (ref 3–12)
Neutro Abs: 8.7 10*3/uL — ABNORMAL HIGH (ref 1.7–7.7)

## 2011-11-15 LAB — CBC
HCT: 24.5 % — ABNORMAL LOW (ref 39.0–52.0)
MCH: 28.9 pg (ref 26.0–34.0)
MCV: 87.5 fL (ref 78.0–100.0)
RDW: 16.1 % — ABNORMAL HIGH (ref 11.5–15.5)
WBC: 11.1 10*3/uL — ABNORMAL HIGH (ref 4.0–10.5)

## 2011-11-15 LAB — COMPREHENSIVE METABOLIC PANEL
ALT: 10 U/L (ref 0–53)
AST: 31 U/L (ref 0–37)
Alkaline Phosphatase: 67 U/L (ref 39–117)
CO2: 24 mEq/L (ref 19–32)
Calcium: 7.7 mg/dL — ABNORMAL LOW (ref 8.4–10.5)
Chloride: 113 mEq/L — ABNORMAL HIGH (ref 96–112)
GFR calc non Af Amer: >90 mL/min (ref >90)
Glucose, Bld: 150 mg/dL — ABNORMAL HIGH (ref 70–99)
Potassium: 3.3 mEq/L — ABNORMAL LOW (ref 3.5–5.1)
Sodium: 146 mEq/L — ABNORMAL HIGH (ref 135–145)

## 2011-11-15 LAB — GLUCOSE, CAPILLARY
Glucose-Capillary: 150 mg/dL — ABNORMAL HIGH (ref 70–99)
Glucose-Capillary: 152 mg/dL — ABNORMAL HIGH (ref 70–99)
Glucose-Capillary: 124 mg/dL — ABNORMAL HIGH (ref 70–99)

## 2011-11-15 LAB — CHOLESTEROL, TOTAL: Cholesterol: 63 mg/dL (ref 0–200)

## 2011-11-15 LAB — BLOOD GAS, ARTERIAL
Acid-Base Excess: 0.1 mmol/L (ref 0.0–2.0)
Bicarbonate: 23.1 mEq/L (ref 20.0–24.0)
FIO2: 0.4 %
TCO2: 20.6 mmol/L (ref 0–100)
pCO2 arterial: 35.4 mmHg (ref 35.0–45.0)
pO2, Arterial: 101 mmHg — ABNORMAL HIGH (ref 80.0–100.0)

## 2011-11-15 LAB — TRIGLYCERIDES: Triglycerides: 77 mg/dL (ref <150)

## 2011-11-15 LAB — VANCOMYCIN, TROUGH: Vancomycin Tr: 11.4 ug/mL (ref 10.0–20.0)

## 2011-11-15 MED ORDER — POTASSIUM CHLORIDE 10 MEQ/100ML IV SOLN
10.0000 meq | INTRAVENOUS | Status: AC
  Administered 2011-11-15 (×4): 10 meq via INTRAVENOUS
  Filled 2011-11-15: qty 400

## 2011-11-15 MED ORDER — FUROSEMIDE 10 MG/ML IJ SOLN
40.0000 mg | Freq: Three times a day (TID) | INTRAMUSCULAR | Status: DC
  Administered 2011-11-15 – 2011-11-16 (×3): 40 mg via INTRAVENOUS
  Filled 2011-11-15 (×6): qty 4

## 2011-11-15 MED ORDER — VANCOMYCIN HCL 1000 MG IV SOLR
750.0000 mg | Freq: Three times a day (TID) | INTRAVENOUS | Status: DC
  Administered 2011-11-15 – 2011-11-17 (×7): 750 mg via INTRAVENOUS
  Filled 2011-11-15 (×8): qty 750

## 2011-11-15 MED ORDER — POTASSIUM CHLORIDE 10 MEQ/50ML IV SOLN
10.0000 meq | INTRAVENOUS | Status: AC
  Administered 2011-11-15 (×3): 10 meq via INTRAVENOUS
  Filled 2011-11-15: qty 150

## 2011-11-15 MED ORDER — DEXTROSE 5 % IV SOLN
INTRAVENOUS | Status: DC
  Administered 2011-11-15 – 2011-11-18 (×2): via INTRAVENOUS
  Administered 2011-11-21: 20 mL via INTRAVENOUS

## 2011-11-15 MED ORDER — FAT EMULSION 20 % IV EMUL
240.0000 mL | INTRAVENOUS | Status: AC
  Administered 2011-11-15: 240 mL via INTRAVENOUS
  Filled 2011-11-15: qty 250

## 2011-11-15 MED ORDER — SODIUM CHLORIDE 0.9 % IV SOLN
500.0000 mg | Freq: Three times a day (TID) | INTRAVENOUS | Status: DC
  Administered 2011-11-15 – 2011-11-22 (×21): 500 mg via INTRAVENOUS
  Filled 2011-11-15 (×24): qty 500

## 2011-11-15 MED ORDER — ZINC TRACE METAL 1 MG/ML IV SOLN
INTRAVENOUS | Status: AC
  Administered 2011-11-15: 17:00:00 via INTRAVENOUS
  Filled 2011-11-15: qty 2000

## 2011-11-15 NOTE — Progress Notes (Signed)
PARENTERAL NUTRITION CONSULT NOTE   Pharmacy Consult for TPN Indication: Bowel obstruction  No Known Allergies  Patient Measurements: Height: 5\' 8"  (172.7 cm) Weight: 140 lb 14 oz (63.9 kg) [name] : 68.4  Usual Weight: 142 lbs  Vital Signs: Temp: 100.6 F (38.1 C) (09/30 0600) Temp src: Oral (09/30 0600) BP: 103/67 mmHg (09/30 0600) Pulse Rate: 120  (09/30 0600)  Intake/Output from previous day: 09/29 0701 - 09/30 0700 In: 2946 [I.V.:667; NG/GT:80; IV Piggyback:759; TPN:1440] Out: 3515 [Urine:3475; Drains:40]  Labs:  [facility] 11/15/11 0440 11/14/11 0500 11/13/11 0515  WBC 11.1* 11.2* 13.9*  HGB 8.1* 8.7* 10.2*  HCT 24.5* 26.6* 31.4*  PLT 118* 118* 134*  APTT -- -- --  INR -- -- --     Basename 11/15/11 0440 11/14/11 0500 11/13/11 0515  NA 146* 144 142  K 3.3* 4.2 3.5  CL 113* 116* 111  CO2 24 24 22   GLUCOSE 150* 127* 127*  BUN 37* 35* 25*  CREATININE 0.99 1.13 0.98  LABCREA -- -- --  CREAT24HRUR -- -- --  CALCIUM 7.7* 7.7* 7.8*  MG 2.1 1.8 1.9  PHOS 5.1* 2.0* --  PROT 5.3* -- --  ALBUMIN 1.4* -- --  AST 31 -- --  ALT 10 -- --  ALKPHOS 67 -- --  BILITOT 0.7 -- --  BILIDIR -- -- --  IBILI -- -- --  PREALBUMIN -- -- --  TRIG 77 -- --  CHOLHDL -- -- --  CHOL 63 -- --   Estimated Creatinine Clearance: 78.9 ml/min (by C-G formula based on Cr of 0.99).    Basename 11/15/11 0358 11/15/11 0012 11/14/11 1954  GLUCAP 139* 139* 112*   CBGs & Insulin requirements past 24 hours:  4 units of SSI   Nutritional Goals:   RD recs 9/23 :   Kcal 1700-1900, Protein 80-90 gm  Clinimix E 5/20 at goal rate of 70 ml/hr + Lipids MWF to provide: 84 g/day protein and avg. 1684 Kcal/day weekly (1958 Kcal/day MWF, 1478 Kcal/day STTHS).  Current nutrition:   Diet: NPO  Clinimix E 5/20 at 60 ml/hr  IVF: NS at 10-20 ml/hr  Assessment:   70 YOM with history of recurrent metastatic rectal cancer underwent exploratory laparotomy, small bowel resection  x2, incidental appendectomy 9/25.  Pt thought to have aspirated in PACU post-op and was reintubated and transferred to ICU    TNA started 9/24.   Renal: ARF resolved, SCr 0.99, UOP 2.3 mL/kg/hr  LFTs: WNL 9/26  Electrolytes: K+ 3.3 - MD ordered 3 runs this AM to replace.  Mag, Corr Ca WNL.  Phos low yesterday and was replaced but has increased past goal range today.  This increase in phosphorus is an unexpected result from 10 mmol of KPhos - will monitor.  Recheck in AM.  Pre-Albumin: 13.1 (9/24), 11.8 (9/25)  TG/Cholesterol: WNL 9/26  CBG's: controlled (<150 mg/dL) -  SSI  moderate scale.    Plan:  Now that ARF appears to be resolved, will increase TNA rate to goal today.  Advance Clinimix E 5/20 to 70 ml/hr.   Fat emulsion at 10 ml/hr (MWF only due to ongoing shortage). TNA to contain standard multivitamins and trace elements (MWF only due to ongoing shortage). Deferring IVF management to MD. Continue moderate SSI q4h. TNA lab panels on Mondays & Thursdays. F/u phos level and BMET in AM.   Clance Boll, PharmD, BCPS Pager: [phone] 11/15/2011 7:23 AM

## 2011-11-15 NOTE — Progress Notes (Signed)
eLink Physician-Brief Progress Note Patient Name: Jeffery [name] DOB: [birth year] MRN: [id number]  Date of Service  11/15/2011   HPI/Events of Note   Hypo K  eICU Interventions  K repleted   Intervention Category Major Interventions: Electrolyte abnormality - evaluation and management  Shan Levans 11/15/2011, 6:23 AM

## 2011-11-15 NOTE — Progress Notes (Signed)
When changing patient's wound vac dressing, noticed a small pencil eraser sized opening in the fascia with what appeared a small portion of bowel to be showing through.  Scant, almost absent, wound vac drainage this shift.  Concerned with having continuous suction against possible exposed portion of bowel.  Notified Dr. Daphine Deutscher on call.  Will transition to W/D dressing until wound can be evaluated by MD in the morning.  Wet to dry dressing applied.  Will continue to monitor.

## 2011-11-15 NOTE — Progress Notes (Signed)
Patient ID: Jeffery [name], male   DOB: 04/25/1960, 51 y.o.   MRN: [id number] General Surgery Note  LOS: 8 days  POD# 4 Room - 1228  Assessment/Plan: Pt had kept lung mets from family.  New pathology with metastatic disease in the pelvis.    1.  LAPAROSCOPIC LYSIS OF ADHESIONS, EXPLORATORY LAPAROTOMY, Small bowel resection x 2, APPLICATION OF WOUND VAC -  Dr. Marilynne Halsted - 11/10/2011  On Primaxin/Vanc.  Has NGT - ileus.  Has wound VAC that is changed M/W/F.  2.  Laparoscopic loop descending diverting colostomy - Dr. Marilynne Halsted - 10/04/2101  3.  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 Nusaybah Ivie, pelvic recurrence.    Dr. Leonard Schwartz. [place] treating oncologist. 4.  Tobacco abuse  5.  Acute respiratory failure following trauma and surgery.  Aspiration pneumonia   Patient re-intubated.   6.  Sepsis - controlled ?  WBC - 11,200 - 11/14/2011 7.  Acute renal insufficiency - improved  Creatinine - 1.13 - 11/14/2011 8.  Malnutrition  On TPN 9.  DVT proph - on Lovenox 10.  Right buttock drain   Leave while output feculent.    Subjective:  Intubated.  Overall tachycardia improving.   Objective:   Filed Vitals:   11/15/11 0600  BP: 103/67  Pulse: 120  Temp: 100.6 F (38.1 C)  Resp: 19     Intake/Output from previous day:  09/29 0701 - 09/30 0700 In: 2946 [I.V.:667; NG/GT:80; IV Piggyback:759; TPN:1440] Out: 3515 [Urine:3475; Drains:40]  Intake/Output this shift:      Physical Exam:   General: WN older AA M who is intubated and sedated.    HEENT: Normal. Pupils equal. .   Lungs: Intubated.   Abdomen: Tight.  No BS.   Wound: VAC on wound.  LLQ colostomy.  No output.   Lab Results:     Basename 11/15/11 0440 11/14/11 0500  WBC 11.1* 11.2*  HGB 8.1* 8.7*  HCT 24.5* 26.6*  PLT 118* 118*    BMET    Basename 11/15/11 0440 11/14/11 0500  NA 146* 144  K 3.3* 4.2  CL 113* 116*  CO2 24 24    GLUCOSE 150* 127*  BUN 37* 35*  CREATININE 0.99 1.13  CALCIUM 7.7* 7.7*    PT/INR   No results found for this basename: LABPROT:2,INR:2 in the last 72 hours  ABG    Basename 11/15/11 0406 11/14/11 1100  PHART 7.436 7.479*  HCO3 23.1 20.5     Studies/Results:  Dg Chest Port 1 View  11/15/2011  *RADIOLOGY REPORT*  Clinical Data: Effusion, endotracheal tube placement.  PORTABLE CHEST - 1 VIEW  Comparison: 11/14/2011.  Findings: Endotracheal tube terminates approximately 2.8 cm above the carina.  Nasogastric tube is followed into the stomach with the tip projecting beyond the inferior boundary of the film.  Right subclavian power port tip and right PICC tip project over the lower SVC or SVC/RA junction.  Lungs are somewhat low in volume with diffuse bilateral air space disease, stable. Underlying pulmonary nodules are relatively obscured.  Probable left pleural effusion.  IMPRESSION: Edema or pneumonia superimposed on pulmonary metastatic disease.   Original Report Authenticated By: Reyes Ivan, M.D.    Dg Chest Port 1 View  11/14/2011  *RADIOLOGY REPORT*  Clinical Data: Endotracheal tube placement.  Bilateral airspace disease.  PORTABLE CHEST - 1 VIEW  Comparison: 1 day prior  Findings:  Endotracheal tube 3.8 cm above carina.  Right-sided Port- A-Cath and PICC lines are unchanged in position.  Normal heart size.  Probable small left pleural effusion, decreased. No pneumothorax.  Minimal improvement in interstitial and airspace disease.  Example improved aeration at left lung base and right upper lobe.  Nasogastric tube terminates beyond the inferior aspect of film.  IMPRESSION:  1.  Minimal improvement in aeration with decreased multifocal infection versus less likely pulmonary edema. 2.  Decreased small left pleural effusion.   Original Report Authenticated By: Consuello Bossier, M.D.    Dg Chest Port 1 View  11/13/2011  *RADIOLOGY REPORT*  Clinical Data: Evaluate endotracheal tube  placement.  PORTABLE CHEST - 1 VIEW  Comparison: Earlier today at 0530 hours.  Findings: 1139 hours.  Endotracheal tube terminates 4.7 cm above carina.  Nasogastric tube extends beyond the  inferior aspect of the film.  Right-sided Port-A-Cath unchanged.  Normal heart size.  Similar small left pleural effusion. No pneumothorax.  Interstitial and airspace disease is not significantly changed and most confluent at the left lung base.  IMPRESSION:  1.  Appropriate position of endotracheal tube, 4.8 cm above carina. 2.  Otherwise, similar interstitial and airspace disease.  Favor multifocal pneumonia. 3.  Similar left pleural effusion.   Original Report Authenticated By: Consuello Bossier, M.D.      Anti-infectives:   Anti-infectives     Start     Dose/Rate Route Frequency Ordered Stop   11/15/11 0615   vancomycin (VANCOCIN) 750 mg in sodium chloride 0.9 % 150 mL IVPB        750 mg 150 mL/hr over 60 Minutes Intravenous 3 times per day 11/15/11 0608     11/14/11 1800   imipenem-cilastatin (PRIMAXIN) 250 mg in sodium chloride 0.9 % 100 mL IVPB        250 mg 200 mL/hr over 30 Minutes Intravenous 4 times per day 11/14/11 1434     11/13/11 1800   vancomycin (VANCOCIN) 750 mg in sodium chloride 0.9 % 150 mL IVPB  Status:  Discontinued        750 mg 150 mL/hr over 60 Minutes Intravenous Every 12 hours 11/13/11 0631 11/15/11 0608   11/13/11 0645   vancomycin (VANCOCIN) 750 mg in sodium chloride 0.9 % 150 mL IVPB        750 mg 150 mL/hr over 60 Minutes Intravenous  Once 11/13/11 0631 11/13/11 0738   11/12/11 1200   imipenem-cilastatin (PRIMAXIN) 500 mg in sodium chloride 0.9 % 100 mL IVPB  Status:  Discontinued        500 mg 200 mL/hr over 30 Minutes Intravenous 3 times per day 11/12/11 1015 11/14/11 1434   11/11/11 2000   micafungin (MYCAMINE) 100 mg in sodium chloride 0.9 % 100 mL IVPB     Comments: stat      100 mg 100 mL/hr over 1 Hours Intravenous Every 24 hours 11/10/11 1939     11/11/11 1800    vancomycin (VANCOCIN) 500 mg in sodium chloride 0.9 % 100 mL IVPB  Status:  Discontinued        500 mg 100 mL/hr over 60 Minutes Intravenous Every 12 hours 11/11/11 1014 11/13/11 0631   11/10/11 2000   micafungin (MYCAMINE) 100 mg in sodium chloride 0.9 % 100 mL IVPB     Comments: stat      100 mg 100 mL/hr over 1 Hours Intravenous Daily 11/10/11 1939 11/10/11 2215   11/10/11 2000   imipenem-cilastatin (PRIMAXIN)  250 mg in sodium chloride 0.9 % 100 mL IVPB  Status:  Discontinued        250 mg 200 mL/hr over 30 Minutes Intravenous 4 times per day 11/10/11 1951 11/12/11 1014   11/10/11 1630   fluconazole (DIFLUCAN) IVPB 200 mg  Status:  Discontinued        200 mg 100 mL/hr over 60 Minutes Intravenous Every 24 hours 11/10/11 1617 11/10/11 1939   11/10/11 1630   vancomycin (VANCOCIN) 500 mg in sodium chloride 0.9 % 100 mL IVPB  Status:  Discontinued        500 mg 100 mL/hr over 60 Minutes Intravenous 3 times per day 11/10/11 1617 11/11/11 1014   11/10/11 1630   piperacillin-tazobactam (ZOSYN) IVPB 3.375 g  Status:  Discontinued        3.375 g 12.5 mL/hr over 240 Minutes Intravenous 3 times per day 11/10/11 1617 11/10/11 1941   11/10/11 0933   cefOXitin (MEFOXIN) 2 g in dextrose 5 % 50 mL IVPB        2 g 100 mL/hr over 30 Minutes Intravenous 60 min pre-op 11/10/11 0926 11/10/11 1105   11/10/11 0930   cefOXitin (MEFOXIN) 1 g in dextrose 5 % 50 mL IVPB  Status:  Discontinued        1 g 100 mL/hr over 30 Minutes Intravenous 3 times per day 11/10/11 0924 11/10/11 0926   11/07/11 2200   cefTRIAXone (ROCEPHIN) 1 g in dextrose 5 % 50 mL IVPB  Status:  Discontinued        1 g 100 mL/hr over 30 Minutes Intravenous Every 24 hours 11/07/11 2124 11/10/11 1536   11/07/11 1830   cefTRIAXone (ROCEPHIN) 1 g in dextrose 5 % 50 mL IVPB        1 g 100 mL/hr over 30 Minutes Intravenous  Once 11/07/11 1819 11/07/11 1920          Ovidio Kin, MD, FACS Pager: [phone],   [facility]  Office: [phone]

## 2011-11-15 NOTE — Progress Notes (Signed)
CARE MANAGEMENT NOTE 11/15/2011  Patient:  Jeffery [name], Jeffery [name]   Account Number:  [id number]  Date Initiated:  11/08/2011  Documentation initiated by:  Lorenda Ishihara  Subjective/Objective Assessment:   51 yo male admitted with SBO. Prior history of SBO, rectal cancer with colon resection and colostomy. PTA lived at home with family.     Action/Plan:   Anticipated DC Date:  11/18/2011   Anticipated DC Plan:  HOME/SELF CARE      DC Planning Services  CM consult      Choice offered to / List presented to:             Status of service:   Medicare Important Message given?   (If response is "NO", the following Medicare IM given date fields will be blank) Date Medicare IM given:   Date Additional Medicare IM given:    Discharge Disposition:    Per UR Regulation:  Reviewed for med. necessity/level of care/duration of stay  If discussed at Long Length of Stay Meetings, dates discussed:    Comments:  [id number]/Rhonda Davis,Rn,BSN,CCM: patient unresponsive and reintubated x3.  Terminal wean planned for Wednesday Oct. 2,2013.  [id number] Earlene Plater, RN, BSN, CCM: CHART REVIEWED AND UPDATED. NO DISCHARGE NEEDS PRESENT AT THIS TIME. CASE MANAGEMENT [phone]

## 2011-11-15 NOTE — Progress Notes (Signed)
Palliative Medicine Team SW Pt discussed in PMT rounds, referred for psychosocial support. Met with pt's girlfriend and mother at bedside who discussed their feelings of shock and grief since pt had not previously shared the extent of his disease with anyone other than his Valetta Mole. Family explain that they are "all coming to grips" with reality. Family report plan for pt to come off vent Wed and at that time, "let him go peacefully" if he doesn't breathe on his own. Family cite their faith as a source of strength. We discussed shifting the focus from a "pull the plug" mentality to a "let go and leave it God" outlook. Discussed with family and separately with RN some tensions in relations between pt's dtr, cousin and other family members. Offered my assistance in any mediation needs. Family able to engage in some reminisce about pt as as "flirt" and a friendly person. We also discussed past losses; this will be mother's 4th and last child to pass away, he is also her first born. Provided some education on the grieving process and bereavement services in the community. Encouraged family to think of ideas to make withdrawal more comfortable experience, ie music, items from home, etc. Family report plan to enlist cousin's husband, who is a Education officer, environmental, for spiritual care, made aware of Chaplain availability. Provided PMT team phone for needs as they arise, and will continue to follow for emotional support.   Kennieth Francois, [place]  Pager [phone] Team phone [phone]

## 2011-11-15 NOTE — Progress Notes (Addendum)
ANTIBIOTIC CONSULT NOTE  Pharmacy Consult for Vancomycin, Primaxin Indication: peritonitis, r/o aspiration pna  No Known Allergies  Patient Measurements: Height: 5\' 8"  (172.7 cm) Weight: 140 lb 14 oz (63.9 kg) [name] : 68.4   Vital Signs: Temp: 100.7 F (38.2 C) (09/30 0400) Temp src: Oral (09/30 0400) BP: 100/70 mmHg (09/30 0200) Pulse Rate: 115  (09/30 0200) Intake/Output from previous day: 09/29 0701 - 09/30 0700 In: 2606 [I.V.:567; NG/GT:80; IV Piggyback:759; TPN:1200] Out: 3115 [Urine:3075; Drains:40]   Labs:  [facility] 11/15/11 0440 11/14/11 0500 11/13/11 0515  WBC 11.1* 11.2* 13.9*  HGB 8.1* 8.7* 10.2*  PLT 118* 118* 134*  LABCREA -- -- --  CREATININE 0.99 1.13 0.98   Estimated Creatinine Clearance: 78.9 ml/min (by C-G formula based on Cr of 0.99).  Microbiology: 9/22 urine culture: >100,000 Klebsiella pneumoniae (sensitive: cefazolin, ceftriaxone, cipro, gent, levo, zosyn, bactrim).  Abx courses: Ceftriaxone 9/22 > 9/24 Cefoxitin (pre-op abx), Zosyn, Fluconazole x 1 9/25 Vanc 9/25 >> Primaxin 9/25 >> Micafungin 9/25 >>  Assessment:  51 YOM on day #6 vancomycin, primaxin, and micafungin to cover for peritonitis and pneumonia in an immunosuppressed patient.  Vancomycin trough level = 11.4 mcg/ml on Vancomycin 750mg  IV q12h (subtherapeutic)  Goal of Therapy:  Vancomycin trough level 15-20 mcg/ml Appropriate dose of Imipenem based on crcl  Plan:   Increase vancomycin 750mg  IV q8h  Terrilee Files, PharmD     11/15/2011 6:03 AM   Addendum:  Now that ARF appears to be resolved and SCr improved, will also adjust Primaxin dose to 500 mg IV q8h for CrCl~88 ml/min (normalized).  Clance Boll, PharmD, BCPS Pager: [phone] 11/15/2011 9:33 AM

## 2011-11-15 NOTE — Progress Notes (Signed)
PULMONARY/CCM CONSULT NOTE  Requesting MD/Service: Byerly/CCS Date of admission: 9/22 Date of consult: 9/25 Reason for consultation: post op VDRF  Pt Profile:  51 yobm with history of recurrent metastatic rectal cancer underwent exploratory laparotomy, small bowel resection x2, incidental appendectomy 9/25. Was thought to have aspirated in PACU post operatively with feculent emesis reported. Required reintubation in PACU. PCCM asked to assist with vent/CCM issues.  Lines, Tubes, etc: ETT 9/25 >> 9/27>>>9/28>>> RUE PICC 9/24 >>  R [place] Portacath (longstanding)   Microbiology: Urine 9/22 >> Klebs (pansens) PCT 9/26: >200  Antibiotics:  Fluconazole 9/25 >> 9/25 Zosyn 9/25 >> 9/25 Vanc 9/25>>> Micafungin 9/26>>> Imipenem 9/25>>>  Studies/Events:  Consults:  CCS (primary)  Best Practice: DVT: LMWH - dose adjusted for renal insuff SUP: PPI Nutrition: TPN Glycemic control: SSI  Sedation/analgesia: continuous sedation  SUBJ: Transient AFRVR 9/26 PM. Sinus tach now. Passed SBT. + F/C on WUA. Plan to extubate 9/27  BP 103/68  Pulse 114  Temp 101.5 F (38.6 C) (Axillary)  Resp 20  Ht 5\' 8"  (1.727 m)  Wt 63.9 kg (140 lb 14 oz)  BMI 21.42 kg/m2  SpO2 100%   EXAM:  Gen: WDWN, sedated and intubated, RASS -1, + F/C weakly HEENT: mm moist Neck: No JVD Lungs: occ rhonchi Cardiovascular: RRR, no M, tachy up to140's Abdomen: abd dressing intact/ drain w/ minimal output  Musculoskeletal: normal development, no edema Neuro: MAEs, CNs intact, + F/C   IMPRESSION:   Pulm:  ABG    Component Value Date/Time   PHART 7.436 11/15/2011 0406   PCO2ART 35.4 11/15/2011 0406   PO2ART 101.0* 11/15/2011 0406   HCO3 23.1 11/15/2011 0406   TCO2 20.6 11/15/2011 0406   ACIDBASEDEF 2.0 11/14/2011 1100   O2SAT 97.5 11/15/2011 0406   PCXR: No sig change w/ diffuse bilateral airspace disease.    Acute respiratory failure - post op abd surgery with ? aspiration, active smoker. ?Aspiration  PNA  ARDS +/- element of edema Pulmonary metastasis  Reintubated on 9/28, tolerating vent well for now, continue full support no weaning.  PLAN -  Daily assess for wean Push diuresis Cont BDs PRN sedation See ID section  Cardiology: Severe sepsis/occult shock -- BP more stable, HR increased due to work of breathing. No results found for this basename: CKTOTAL:3,CKMB:3,TROPONINI:3 in the last 168 hours Shock resolved.  PLAN -  KVO IVF. Gentle diureses today.  Renal:   Lab 11/15/11 0440 11/14/11 0500 11/13/11 0515  NA 146* 144 142  CL 113* 116* 111  K 3.3* 4.2 3.5  CO2 24 24 22   BUN 37* 35* 25*  CREATININE 0.99 1.13 0.98  GLUCOSE 150* 127* 127*  MG 2.1 1.8 1.9   Hypernatremia Resolved renal failure Plan Free water, will change MIVF to KVO D5W F/u chemistry  GI: S/p exp lap - small bowel resection with fecal contamination  Rectal ca now w/ mets to pelvis and lung PLAN -  Surgery following Cont VAC TPN  NG tube   HEME:  Lab 11/15/11 0440 11/14/11 0500 11/13/11 0515  HGB 8.1* 8.7* 10.2*  HCT 24.5* 26.6* 31.4*  PLT 118* 118* 134*  anemia of critical illness Plan: Trigger <7 Trend CBC PPI  Endocrine CBG (last 3)   Basename 11/15/11 0807 11/15/11 0358 11/15/11 0012  GLUCAP 150* 139* 139*  Hyperglycemia Plan: ssi  Infectious disease:  Peritonitis Aspiration PNA --  Pct>200.   PLAN -  Cont broad spectrum abx   Recheck PCT, can prob stop vanc  and micafungin.   Neuro: Sedated on vent P: Cont current sedation protocol Will prob transition to precedex as we get loser to extubation    Goals of care patient's prognosis is very poor, the patient did not want to be intubated on 9/28 but wife and mother talked him into it.  After discussion with the family, the patient is no CPR and no cardioversion limited code and family will return again on Wednesday and if patient is not improving then will likely terminally extubate.  Attending: I have seen and  examined the patient with nurse practitioner/resident and agree with the note above.   Discussed code status with girlfriend.  She voiced understanding that "he isn't going to make it" and that we will plan for terminal wean on Wednesday.  CC time 30 minutes  Yolonda Kida PCCM Pager: [phone] Cell: [phone] If no response, call [phone]

## 2011-11-16 ENCOUNTER — Inpatient Hospital Stay (HOSPITAL_COMMUNITY): Payer: Medicaid Other

## 2011-11-16 DIAGNOSIS — I10 Essential (primary) hypertension: Secondary | ICD-10-CM

## 2011-11-16 LAB — BLOOD GAS, ARTERIAL
Acid-Base Excess: 1.9 mmol/L (ref 0.0–2.0)
Bicarbonate: 25.8 mEq/L — ABNORMAL HIGH (ref 20.0–24.0)
FIO2: 0.3 %
O2 Saturation: 97.4 %
pO2, Arterial: 81 mmHg (ref 80.0–100.0)

## 2011-11-16 LAB — GLUCOSE, CAPILLARY
Glucose-Capillary: 129 mg/dL — ABNORMAL HIGH (ref 70–99)
Glucose-Capillary: 144 mg/dL — ABNORMAL HIGH (ref 70–99)
Glucose-Capillary: 135 mg/dL — ABNORMAL HIGH (ref 70–99)
Glucose-Capillary: 122 mg/dL — ABNORMAL HIGH (ref 70–99)
Glucose-Capillary: 131 mg/dL — ABNORMAL HIGH (ref 70–99)

## 2011-11-16 LAB — BASIC METABOLIC PANEL
BUN: 27 mg/dL — ABNORMAL HIGH (ref 6–23)
Creatinine, Ser: 0.82 mg/dL (ref 0.50–1.35)
GFR calc Af Amer: >90 mL/min (ref >90)
GFR calc non Af Amer: >90 mL/min (ref >90)
Potassium: 3.2 mEq/L — ABNORMAL LOW (ref 3.5–5.1)

## 2011-11-16 LAB — CBC
MCHC: 32.6 g/dL (ref 30.0–36.0)
Platelets: 142 10*3/uL — ABNORMAL LOW (ref 150–400)
RDW: 16.4 % — ABNORMAL HIGH (ref 11.5–15.5)

## 2011-11-16 MED ORDER — JEVITY 1.2 CAL PO LIQD: 1000.0000 mL | ORAL | Status: DC

## 2011-11-16 MED ORDER — CLINIMIX E/DEXTROSE (5/20) 5 % IV SOLN
INTRAVENOUS | Status: AC
  Administered 2011-11-16: 19:00:00 via INTRAVENOUS
  Filled 2011-11-16: qty 2000

## 2011-11-16 MED ORDER — FUROSEMIDE 10 MG/ML IJ SOLN
40.0000 mg | Freq: Every day | INTRAMUSCULAR | Status: DC
  Filled 2011-11-16: qty 4

## 2011-11-16 MED ORDER — POTASSIUM CHLORIDE 10 MEQ/50ML IV SOLN
10.0000 meq | INTRAVENOUS | Status: AC
  Administered 2011-11-16 (×2): 10 meq via INTRAVENOUS
  Filled 2011-11-16 (×2): qty 50

## 2011-11-16 MED ORDER — OXEPA PO LIQD
1000.0000 mL | ORAL | Status: DC
  Administered 2011-11-16 – 2011-11-19 (×4): 1000 mL
  Filled 2011-11-16 (×5): qty 1000

## 2011-11-16 NOTE — Progress Notes (Signed)
Palliative Medicine Team SW Psychosocial follow up with pt's s/o. Brought requested cd player for pt's comfort. Discussed with CCM Dr. Kendrick Fries plan to continue aggressive tx at this time in order to optimize chances for extubation. Discussion with s/o to temper family's expectations about what this means for pt's general outlook as she was initially under the impression that "they think he's going to make it now." CCM plan to meet with family tomorrow morning to discuss plan, PMT to shadow chart, continue to be available as needed.   Family gave following contact #s:  S/O [phone] Mtr [phone]  Kennieth Francois, [facility] Team Phone [phone]

## 2011-11-16 NOTE — Progress Notes (Signed)
PULMONARY/CCM CONSULT NOTE  Requesting MD/Service: Byerly/CCS Date of admission: 9/22 Date of consult: 9/25 Reason for consultation: post op VDRF  Pt Profile:  51 yobm with history of recurrent metastatic rectal cancer underwent exploratory laparotomy, small bowel resection x2, incidental appendectomy 9/25. Was thought to have aspirated in PACU post operatively with feculent emesis reported. Required reintubation in PACU. PCCM asked to assist with vent/CCM issues.  Lines, Tubes, etc: ETT 9/25 >> 9/27>>>9/28>>> RUE PICC 9/24 >>  R [place] Portacath (longstanding)   Microbiology: Urine 9/22 >> Klebs (pansens) PCT 9/26: >200 10/1 blood >> 10/1 resp >> 10/1 urine >>  Antibiotics:  Fluconazole 9/25 >> 9/25 Zosyn 9/25 >> 9/25 Vanc 9/25>>> Micafungin 9/26>>> Imipenem 9/25>>>  Studies/Events:  Consults:  CCS (primary)  Best Practice: DVT: LMWH - dose adjusted for renal insuff SUP: PPI Nutrition: TPN Glycemic control: SSI  Sedation/analgesia: continuous sedation  SUBJ: Fever overnight  BP 109/79  Pulse 108  Temp 98.8 F (37.1 C) (Oral)  Resp 19  Ht 5\' 8"  (1.727 m)  Wt 63.9 kg (140 lb 14 oz)  BMI 21.42 kg/m2  SpO2 100%   EXAM:  Gen: sedated on vent NCAT: PERRL, ETT in place PULM: rhonchi bilaterally CV: RRR, few PVC's AB: BS infrequent, soft Ext: warm, no edema Neuro: heavily sedated   IMPRESSION:   Pulm:  ABG    Component Value Date/Time   PHART 7.430 11/16/2011 0448   PCO2ART 39.5 11/16/2011 0448   PO2ART 81.0 11/16/2011 0448   HCO3 25.8* 11/16/2011 0448   TCO2 24.4 11/16/2011 0448   ACIDBASEDEF 2.0 11/14/2011 1100   O2SAT 97.4 11/16/2011 0448   PCXR: No sig change w/ diffuse bilateral airspace disease.   Acute respiratory failure - post op abd surgery with likely aspiration, active smoker. Aspiration PNA  ARDS +/- element of edema, oxygenation improved greatly Pulmonary metastasis  Reintubated on 9/28, tolerating vent well for now, continue full  support .  PLAN -  Daily WUA/SBT Change lasix dose to daily Cont BDs PRN sedation See ID section  Cardiology: Severe sepsis/occult shock -- resolved  Shock resolved.  PLAN -  KVO IVF. Gentle diureses today.  Renal:   Lab 11/16/11 0350 11/15/11 0440 11/14/11 0500 11/13/11 0515  NA 146* 146* 144 --  CL 113* 113* 116* --  K 3.2* 3.3* 4.2 --  CO2 28 24 24  --  BUN 27* 37* 35* --  CREATININE 0.82 0.99 1.13 --  GLUCOSE 159* 150* 127* --  MG -- 2.1 1.8 1.9   Hypernatremia Resolved renal failure Hypokalemia from diuresis Plan TPN per pharmacy Replace K Decrease lasix to daily  GI: S/p exp lap - small bowel resection with fecal contamination  Rectal ca now w/ mets to pelvis and lung PLAN -  Surgery following Cont VAC TPN --> enteral feedings today NG tube   HEME:  Lab 11/16/11 0350 11/15/11 0440 11/14/11 0500  HGB 7.6* 8.1* 8.7*  HCT 23.3* 24.5* 26.6*  PLT 142* 118* 118*  anemia of critical illness Plan: Trigger <7 Trend CBC PPI  Endocrine CBG (last 3)   Basename 11/16/11 0756 11/16/11 0344 11/15/11 1918  GLUCAP 155* 129* 124*  Hyperglycemia Plan: ssi  Infectious disease:  Peritonitis Aspiration PNA --  Pct>200.   Fever to 103 on 9/30, due to pneumonia?  PLAN -  Cont broad spectrum abx  And mica Repeat cultures  Neuro: Sedated on vent P: Cont current sedation protocol SBT/WUA   Goals of care: I would like to try to "  tune him up" as best as possible for extubation as he is very functional at baseline (still working full time last week).  However I want to make sure the family understands his overall grim prognosis from his metastatic malignancy (I don't think they do understand this).  If he is still on the vent next week, then the likelihood that he would be able to return to his normal state of health would be very low.  Discussed with Drs. Byerly and Sherrill  CC time 30 minutes.  Yolonda Kida PCCM Pager: [phone] Cell:  [phone] If no response, call [phone]

## 2011-11-16 NOTE — Progress Notes (Signed)
PARENTERAL NUTRITION CONSULT NOTE   Pharmacy Consult for TPN Indication: Bowel obstruction  No Known Allergies  Patient Measurements: Height: 5\' 8"  (172.7 cm) Weight: 140 lb 14 oz (63.9 kg) [name] : 68.4  Usual Weight: 142 lbs  Vital Signs: Temp: 98.8 F (37.1 C) (10/01 0000) Temp src: Oral (10/01 0000) BP: 109/79 mmHg (10/01 0600) Pulse Rate: 108  (10/01 0600)  Intake/Output from previous day: 09/30 0701 - 10/01 0700 In: 4487 [I.V.:1315; NG/GT:30; IV Piggyback:1472; TPN:1660] Out: 4771 [ZOXWR:6045; Emesis/NG output:40; Drains:45; Stool:1]  Labs:  [facility] 11/16/11 0350 11/15/11 0440 11/14/11 0500  WBC 10.2 11.1* 11.2*  HGB 7.6* 8.1* 8.7*  HCT 23.3* 24.5* 26.6*  PLT 142* 118* 118*  APTT -- -- --  INR -- -- --     Basename 11/16/11 0350 11/15/11 0440 11/14/11 0500  NA 146* 146* 144  K 3.2* 3.3* 4.2  CL 113* 113* 116*  CO2 28 24 24   GLUCOSE 159* 150* 127*  BUN 27* 37* 35*  CREATININE 0.82 0.99 1.13  LABCREA -- -- --  CREAT24HRUR -- -- --  CALCIUM 7.6* 7.7* 7.7*  MG -- 2.1 1.8  PHOS 4.0 5.1* 2.0*  PROT -- 5.3* --  ALBUMIN -- 1.4* --  AST -- 31 --  ALT -- 10 --  ALKPHOS -- 67 --  BILITOT -- 0.7 --  BILIDIR -- -- --  IBILI -- -- --  PREALBUMIN -- <3.0* --  TRIG -- 77 --  CHOLHDL -- -- --  CHOL -- 63 --   Estimated Creatinine Clearance: 95.2 ml/min (by C-G formula based on Cr of 0.82).    Basename 11/16/11 0344 11/15/11 1918 11/15/11 1612  GLUCAP 129* 124* 152*   CBGs & Insulin requirements past 24 hours:  10 units of SSI   Nutritional Goals:   RD recs 9/23 :   Kcal 1700-1900, Protein 80-90 gm  Clinimix E 5/20 at goal rate of 70 ml/hr + Lipids MWF to provide: 84 g/day protein and avg. 1684 Kcal/day weekly (1958 Kcal/day MWF, 1478 Kcal/day STTHS).  Current nutrition:   Diet: NPO  Clinimix E 5/20 at 70 ml/hr + fat emulsion 20% at 10 ml/hr MWF  IVF: D5W at 10-20 ml/hr  Assessment:   11 YOM with history of recurrent metastatic  rectal cancer underwent exploratory laparotomy, small bowel resection x2, incidental appendectomy 9/25.  Pt thought to have aspirated in PACU post-op and was reintubated and transferred to ICU    TNA started 9/24.  Continuing as comfort measure until terminal wean on Wednesday.  Renal: ARF resolved  LFTs: WNL 9/30  Electrolytes: K+ 3.2 - low despite 7 runs given yesterday, will order replacement this AM.  Phos now WNL.  Na slightly high at 146.  Pre-Albumin: 13.1 (9/24), 11.8 (9/25), <3.0 (9/30) --> dropped despite advancing TNA  TG/Cholesterol: WNL 9/30  CBG's: controlled (<150 mg/dL) -  SSI moderate scale.    Plan:  Continue Clinimix E 5/20 at 70 ml/hr until specified by MD to stop. Fat emulsion at 10 ml/hr (MWF only due to ongoing shortage). TNA to contain standard multivitamins and trace elements (MWF only due to ongoing shortage). KCl 10 mEq/41mL IV x 3 runs. Deferring IVF management to MD. Continue moderate SSI q4h. TNA lab panels on Mondays & Thursdays. F/u BMET in AM.   Clance Boll, PharmD, BCPS Pager: [phone] 11/16/2011 7:11 AM

## 2011-11-16 NOTE — Progress Notes (Signed)
Patient ID: Jeffery [name], male   DOB: 11/09/1960, 51 y.o.   MRN: [id number] General Surgery Note   LOS: 9 days    Assessment/Plan: Pt had kept lung mets from family.  New pathology with metastatic disease in the pelvis.    1.  LAPAROSCOPIC LYSIS OF ADHESIONS, EXPLORATORY LAPAROTOMY, Small bowel resection x 2, APPLICATION OF WOUND VAC -    On Primaxin/Vanc for spillage.    Has NGT - having flatus in ostomy bag.  Minimal NGT output.    Has wound VAC that is changed M/W/F.  Consider starting tube feeds today.   2.  Laparoscopic loop descending diverting colostomy -   3.  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 Jerre Vandrunen, pelvic recurrence.    Dr. Leonard Schwartz. Sherrill treating oncologist. 4.  Tobacco abuse  5.  Acute respiratory failure following trauma and surgery.  Aspiration pneumonia   Patient re-intubated.  6.  Sepsis - controlled ? Still febrile.    WBC - 11,200 - 11/14/2011 7.  Acute renal insufficiency - improved  Creatinine - 1.13 - 11/14/2011 8.  Malnutrition  On TPN 9.  DVT proph - on Lovenox 10.  Right buttock drain   Leave while output feculent.   11.  dispo - think it is premature to do terminal wean for cancer at this point.  There is low volume of disease overall.  Would reconsider if no improvement over next 1-2 weeks, but pt is young and could potentially recover from this insult.    Subjective:  Intubated.  More alert today.   Objective:   Filed Vitals:   11/16/11 0600  BP: 109/79  Pulse: 108  Temp:   Resp: 19     Intake/Output from previous day:  09/30 0701 - 10/01 0700 In: 4487 [I.V.:1315; NG/GT:30; IV Piggyback:1472; TPN:1660] Out: 1308 [MVHQI:6962; Emesis/NG output:40; Drains:45; Stool:1]  Intake/Output this shift:      Physical Exam:   General: WN older AA M who is intubated and sedated.    HEENT: Normal. Pupils equal. .   Lungs: Intubated.   Abdomen: Tight.  No  BS.   Wound: VAC on wound.  LLQ colostomy.  No output.   Lab Results:     [facility] 11/16/11 0350 11/15/11 0440  WBC 10.2 11.1*  HGB 7.6* 8.1*  HCT 23.3* 24.5*  PLT 142* 118*    BMET    Basename 11/16/11 0350 11/15/11 0440  NA 146* 146*  K 3.2* 3.3*  CL 113* 113*  CO2 28 24  GLUCOSE 159* 150*  BUN 27* 37*  CREATININE 0.82 0.99  CALCIUM 7.6* 7.7*    PT/INR   No results found for this basename: LABPROT:2,INR:2 in the last 72 hours  ABG    Basename 11/16/11 0448 11/15/11 0406  PHART 7.430 7.436  HCO3 25.8* 23.1     Studies/Results:  Dg Chest Port 1 View  11/16/2011  *RADIOLOGY REPORT*  Clinical Data: Evaluate endotracheal tube. Rectal adenocarcinoma with lung metastasis.  PORTABLE CHEST - 1 VIEW  Comparison: 1 day prior  Findings: Endotracheal tube unchanged in position, with tip 2.5 cm above carina.  Nasogastric tube extends beyond the  inferior aspect of the film.  Right-sided Port-A-Cath and right-sided PICC lines are unchanged.  Normal heart size.  Mild left hemidiaphragm elevation.  Cannot exclude small left pleural effusion. No pneumothorax.  No significant change in bilateral interstitial and airspace opacities.  Superimposed upon pulmonary metastasis.  IMPRESSION:  1. No significant change since one day prior. 2.  Pulmonary edema versus less likely infection superimposed upon pulmonary metastasis. 3.  Stable and appropriate position of endotracheal tube.   Original Report Authenticated By: Consuello Bossier, M.D.    Dg Chest Port 1 View  11/15/2011  *RADIOLOGY REPORT*  Clinical Data: Effusion, endotracheal tube placement.  PORTABLE CHEST - 1 VIEW  Comparison: 11/14/2011.  Findings: Endotracheal tube terminates approximately 2.8 cm above the carina.  Nasogastric tube is followed into the stomach with the tip projecting beyond the inferior boundary of the film.  Right subclavian power port tip and right PICC tip project over the lower SVC or SVC/RA junction.  Lungs are  somewhat low in volume with diffuse bilateral air space disease, stable. Underlying pulmonary nodules are relatively obscured.  Probable left pleural effusion.  IMPRESSION: Edema or pneumonia superimposed on pulmonary metastatic disease.   Original Report Authenticated By: Reyes Ivan, M.D.      Anti-infectives:   Anti-infectives     Start     Dose/Rate Route Frequency Ordered Stop   11/15/11 1400   imipenem-cilastatin (PRIMAXIN) 500 mg in sodium chloride 0.9 % 100 mL IVPB        500 mg 200 mL/hr over 30 Minutes Intravenous 3 times per day 11/15/11 0931     11/15/11 0615   vancomycin (VANCOCIN) 750 mg in sodium chloride 0.9 % 150 mL IVPB        750 mg 150 mL/hr over 60 Minutes Intravenous 3 times per day 11/15/11 0608     11/14/11 1800   imipenem-cilastatin (PRIMAXIN) 250 mg in sodium chloride 0.9 % 100 mL IVPB  Status:  Discontinued        250 mg 200 mL/hr over 30 Minutes Intravenous 4 times per day 11/14/11 1434 11/15/11 0931   11/13/11 1800   vancomycin (VANCOCIN) 750 mg in sodium chloride 0.9 % 150 mL IVPB  Status:  Discontinued        750 mg 150 mL/hr over 60 Minutes Intravenous Every 12 hours 11/13/11 0631 11/15/11 0608   11/13/11 0645   vancomycin (VANCOCIN) 750 mg in sodium chloride 0.9 % 150 mL IVPB        750 mg 150 mL/hr over 60 Minutes Intravenous  Once 11/13/11 0631 11/13/11 0738   11/12/11 1200   imipenem-cilastatin (PRIMAXIN) 500 mg in sodium chloride 0.9 % 100 mL IVPB  Status:  Discontinued        500 mg 200 mL/hr over 30 Minutes Intravenous 3 times per day 11/12/11 1015 11/14/11 1434   11/11/11 2000   micafungin (MYCAMINE) 100 mg in sodium chloride 0.9 % 100 mL IVPB     Comments: stat      100 mg 100 mL/hr over 1 Hours Intravenous Every 24 hours 11/10/11 1939     11/11/11 1800   vancomycin (VANCOCIN) 500 mg in sodium chloride 0.9 % 100 mL IVPB  Status:  Discontinued        500 mg 100 mL/hr over 60 Minutes Intravenous Every 12 hours 11/11/11 1014 11/13/11  0631   11/10/11 2000   micafungin (MYCAMINE) 100 mg in sodium chloride 0.9 % 100 mL IVPB     Comments: stat      100 mg 100 mL/hr over 1 Hours Intravenous Daily 11/10/11 1939 11/10/11 2215   11/10/11 2000   imipenem-cilastatin (PRIMAXIN) 250 mg in sodium chloride 0.9 % 100 mL IVPB  Status:  Discontinued  250 mg 200 mL/hr over 30 Minutes Intravenous 4 times per day 11/10/11 1951 11/12/11 1014   11/10/11 1630   fluconazole (DIFLUCAN) IVPB 200 mg  Status:  Discontinued        200 mg 100 mL/hr over 60 Minutes Intravenous Every 24 hours 11/10/11 1617 11/10/11 1939   11/10/11 1630   vancomycin (VANCOCIN) 500 mg in sodium chloride 0.9 % 100 mL IVPB  Status:  Discontinued        500 mg 100 mL/hr over 60 Minutes Intravenous 3 times per day 11/10/11 1617 11/11/11 1014   11/10/11 1630   piperacillin-tazobactam (ZOSYN) IVPB 3.375 g  Status:  Discontinued        3.375 g 12.5 mL/hr over 240 Minutes Intravenous 3 times per day 11/10/11 1617 11/10/11 1941   11/10/11 0933   cefOXitin (MEFOXIN) 2 g in dextrose 5 % 50 mL IVPB        2 g 100 mL/hr over 30 Minutes Intravenous 60 min pre-op 11/10/11 0926 11/10/11 1105   11/10/11 0930   cefOXitin (MEFOXIN) 1 g in dextrose 5 % 50 mL IVPB  Status:  Discontinued        1 g 100 mL/hr over 30 Minutes Intravenous 3 times per day 11/10/11 0924 11/10/11 0926   11/07/11 2200   cefTRIAXone (ROCEPHIN) 1 g in dextrose 5 % 50 mL IVPB  Status:  Discontinued        1 g 100 mL/hr over 30 Minutes Intravenous Every 24 hours 11/07/11 2124 11/10/11 1536   11/07/11 1830   cefTRIAXone (ROCEPHIN) 1 g in dextrose 5 % 50 mL IVPB        1 g 100 mL/hr over 30 Minutes Intravenous  Once 11/07/11 1819 11/07/11 1920          Ovidio Kin, MD, FACS Pager: [phone],   [facility] Office: [phone]

## 2011-11-16 NOTE — Clinical Social Work Note (Signed)
CSW met with Pt's girlfriend of 15 years, Liborio Nixon to check in. She reports she got great news from the surgeon and is very hopeful for Pt's improvement. She is aware of family conference for tomorrow and states that Pt's mother, cousin, daughter and herself will be here for meeting between 10 and 11am. Per girlfriend, Pt's mom is taking this hard. Pt is her last surviving son out of 4 children.   Doreen Salvage, LCSWA ICU/Stepdown Clinical Social Worker [facility] Cell [phone] Hours 8am-1200pm M-F

## 2011-11-16 NOTE — Progress Notes (Signed)
ANTIBIOTIC CONSULT NOTE - FOLLOW UP  Pharmacy Consult for Vancomycin (Primaxin) Indication: Peritonitis, r/o aspiration pna  No Known Allergies  Patient Measurements: Height: 5\' 8"  (172.7 cm) Weight: 140 lb 14 oz (63.9 kg) [name] : 68.4    Vital Signs: Temp: 102.5 F (39.2 C) (10/01 1218) Temp src: Oral (10/01 1218) BP: 122/77 mmHg (10/01 1400) Pulse Rate: 124  (10/01 1400) Intake/Output from previous day: 09/30 0701 - 10/01 0700 In: 4487 [I.V.:1315; NG/GT:30; IV Piggyback:1472; TPN:1660] Out: 2951 [OACZY:6063; Emesis/NG output:40; Drains:45; Stool:1] Intake/Output from this shift: Total I/O In: 494 [I.V.:174; TPN:320] Out: 2100 [Urine:2100]  Labs:  [facility] 11/16/11 0350 11/15/11 0440 11/14/11 0500  WBC 10.2 11.1* 11.2*  HGB 7.6* 8.1* 8.7*  PLT 142* 118* 118*  LABCREA -- -- --  CREATININE 0.82 0.99 1.13   Estimated Creatinine Clearance: 95.2 ml/min (by C-G formula based on Cr of 0.82).  Basename 11/16/11 1439 11/15/11 0440  VANCOTROUGH 11.6 11.4  VANCOPEAK -- --  VANCORANDOM -- --  GENTTROUGH -- --  GENTPEAK -- --  GENTRANDOM -- --  TOBRATROUGH -- --  TOBRAPEAK -- --  TOBRARND -- --  AMIKACINPEAK -- --  AMIKACINTROU -- --  AMIKACIN -- --     Microbiology: Recent Results (from the past 720 hour(s))  URINE CULTURE     Status: Normal   Collection Time   11/07/11  4:45 PM      Component Value Range Status Comment   Specimen Description URINE, CLEAN CATCH   Final    Special Requests NONE   Final    Culture  Setup Time 11/07/2011 20:12   Final    Colony Count >=100,000 COLONIES/ML   Final    Culture KLEBSIELLA PNEUMONIAE   Final    Report Status 11/09/2011 FINAL   Final    Organism ID, Bacteria KLEBSIELLA PNEUMONIAE   Final   MRSA PCR SCREENING     Status: Normal   Collection Time   11/10/11  3:55 PM      Component Value Range Status Comment   MRSA by PCR NEGATIVE  NEGATIVE Final     Anti-infectives     Start     Dose/Rate Route  Frequency Ordered Stop   11/15/11 1400   imipenem-cilastatin (PRIMAXIN) 500 mg in sodium chloride 0.9 % 100 mL IVPB        500 mg 200 mL/hr over 30 Minutes Intravenous 3 times per day 11/15/11 0931     11/15/11 0615   vancomycin (VANCOCIN) 750 mg in sodium chloride 0.9 % 150 mL IVPB        750 mg 150 mL/hr over 60 Minutes Intravenous 3 times per day 11/15/11 0608     11/14/11 1800   imipenem-cilastatin (PRIMAXIN) 250 mg in sodium chloride 0.9 % 100 mL IVPB  Status:  Discontinued        250 mg 200 mL/hr over 30 Minutes Intravenous 4 times per day 11/14/11 1434 11/15/11 0931   11/13/11 1800   vancomycin (VANCOCIN) 750 mg in sodium chloride 0.9 % 150 mL IVPB  Status:  Discontinued        750 mg 150 mL/hr over 60 Minutes Intravenous Every 12 hours 11/13/11 0631 11/15/11 0608   11/13/11 0645   vancomycin (VANCOCIN) 750 mg in sodium chloride 0.9 % 150 mL IVPB        750 mg 150 mL/hr over 60 Minutes Intravenous  Once 11/13/11 0631 11/13/11 0738   11/12/11 1200   imipenem-cilastatin (PRIMAXIN) 500 mg  in sodium chloride 0.9 % 100 mL IVPB  Status:  Discontinued        500 mg 200 mL/hr over 30 Minutes Intravenous 3 times per day 11/12/11 1015 11/14/11 1434   11/11/11 2000   micafungin (MYCAMINE) 100 mg in sodium chloride 0.9 % 100 mL IVPB     Comments: stat      100 mg 100 mL/hr over 1 Hours Intravenous Every 24 hours 11/10/11 1939     11/11/11 1800   vancomycin (VANCOCIN) 500 mg in sodium chloride 0.9 % 100 mL IVPB  Status:  Discontinued        500 mg 100 mL/hr over 60 Minutes Intravenous Every 12 hours 11/11/11 1014 11/13/11 0631   11/10/11 2000   micafungin (MYCAMINE) 100 mg in sodium chloride 0.9 % 100 mL IVPB     Comments: stat      100 mg 100 mL/hr over 1 Hours Intravenous Daily 11/10/11 1939 11/10/11 2215   11/10/11 2000   imipenem-cilastatin (PRIMAXIN) 250 mg in sodium chloride 0.9 % 100 mL IVPB  Status:  Discontinued        250 mg 200 mL/hr over 30 Minutes Intravenous 4 times  per day 11/10/11 1951 11/12/11 1014   11/10/11 1630   fluconazole (DIFLUCAN) IVPB 200 mg  Status:  Discontinued        200 mg 100 mL/hr over 60 Minutes Intravenous Every 24 hours 11/10/11 1617 11/10/11 1939   11/10/11 1630   vancomycin (VANCOCIN) 500 mg in sodium chloride 0.9 % 100 mL IVPB  Status:  Discontinued        500 mg 100 mL/hr over 60 Minutes Intravenous 3 times per day 11/10/11 1617 11/11/11 1014   11/10/11 1630   piperacillin-tazobactam (ZOSYN) IVPB 3.375 g  Status:  Discontinued        3.375 g 12.5 mL/hr over 240 Minutes Intravenous 3 times per day 11/10/11 1617 11/10/11 1941   11/10/11 0933   cefOXitin (MEFOXIN) 2 g in dextrose 5 % 50 mL IVPB        2 g 100 mL/hr over 30 Minutes Intravenous 60 min pre-op 11/10/11 0926 11/10/11 1105   11/10/11 0930   cefOXitin (MEFOXIN) 1 g in dextrose 5 % 50 mL IVPB  Status:  Discontinued        1 g 100 mL/hr over 30 Minutes Intravenous 3 times per day 11/10/11 0924 11/10/11 0926   11/07/11 2200   cefTRIAXone (ROCEPHIN) 1 g in dextrose 5 % 50 mL IVPB  Status:  Discontinued        1 g 100 mL/hr over 30 Minutes Intravenous Every 24 hours 11/07/11 2124 11/10/11 1536   11/07/11 1830   cefTRIAXone (ROCEPHIN) 1 g in dextrose 5 % 50 mL IVPB        1 g 100 mL/hr over 30 Minutes Intravenous  Once 11/07/11 1819 11/07/11 1920          Assessment:  51 YOM on Day # 7 Vancomycin and Primaxin per pharmacy for r/o aspiration pna and peritonitis. Pt is also on micafungin per MD  Vancomycin dose empirically decreased to 500mg  IV q12h based on declining renal fx on 9/26  VT low on 500mg  IV q8h so dose increased to 750mg  q8h on 9/28  VT today on 750mg  IV q8h is still low, likely d/t improved renal function  Primaxin dose of 500mg  IV q8h remains appropriate based on normalized crcl of >142ml/min/1.73m2  Tm 103.1, WBC down and wnl. Scr down  wnl.  Goal of Therapy:  Vancomycin trough level 15-20 mcg/ml Appropriate dose of Primaxin  Plan:    Increase Vancomycin to 1g IV q8h  Recheck VT at Css if indicated  No change to Primaxin  Continue to monitor and adjust doses as necessary  Gwen Her PharmD  [phone] 11/16/2011 3:51 PM

## 2011-11-16 NOTE — Progress Notes (Signed)
Nutrition Follow-up  Intervention:  1. TNA per pharmacy. Recommend wean TNA if pt has positive tolerance of TF. 2. Will initiate TF of Oxepa @ 20 ml/hr and increase by 5 ml every 4 hours to a goal rate of 55 ml/hr. Provides 1980 kcal, 83 grams protein, and 1036 ml free water. (Meets 99% of estimated energy needs and 100% of estimated protein needs.) 3. RD to follow for nutrition plan of care.   Assessment:   Received consult for tube feeding initiation and management. Patient is intubated on mechanical ventilation. Patient currently receiving Clinimix E 5/20 at 70 ml/hr plus lipids 20% 10 ml/hr MWF. TNA provides a weekly average of 1684 kcal and 84 grams of protein. (Meets 80% of estimated kcal needs and 100% of estimated protein needs). Per MD note, pt with ARDS and edema. Per MD, enteral feedings to start today.   Diet Order:  NPO.Climix E 5/20 at 70 ml/hr plus lipids 20% 10 ml/hr MWF.   Meds: Scheduled Meds:   . antiseptic oral rinse  15 mL Mouth Rinse QID  . chlorhexidine  15 mL Mouth Rinse BID  . furosemide  40 mg Intravenous Daily  . imipenem-cilastatin  500 mg Intravenous Q8H  . insulin aspart  0-15 Units Subcutaneous Q4H  . levalbuterol  0.63 mg Nebulization Q6H  . metoprolol  5 mg Intravenous Q6H  . micafungin (MYCAMINE) IV  100 mg Intravenous Q24H  . pantoprazole (PROTONIX) IV  40 mg Intravenous QHS  . potassium chloride  10 mEq Intravenous Q1 Hr x 4  . potassium chloride  10 mEq Intravenous Q1 Hr x 3  . vancomycin  750 mg Intravenous Q8H  . DISCONTD: furosemide  40 mg Intravenous Q8H   Continuous Infusions:   . dextrose 20 mL/hr at 11/15/11 1200  . fat emulsion 240 mL (11/15/11 1700)  . feeding supplement (JEVITY 1.2 CAL)    . fentaNYL infusion INTRAVENOUS 75 mcg/hr (11/12/11 0049)  . fentaNYL infusion INTRAVENOUS 50 mcg/hr (11/15/11 1900)  . midazolam (VERSED) infusion 2 mg/hr (11/16/11 0350)  . TPN (CLINIMIX) +/- additives 60 mL/hr at 11/14/11 1745  . TPN (CLINIMIX)  +/- additives 70 mL/hr at 11/15/11 1700  . TPN (CLINIMIX) +/- additives     PRN Meds:.acetaminophen (TYLENOL) oral liquid 160 mg/5 mL, fentaNYL, fentaNYL, fentaNYL, hydrALAZINE, levalbuterol, metoprolol, midazolam, ondansetron, pneumococcal 23 valent vaccine, sodium chloride, sodium chloride  Labs:  CMP     Component Value Date/Time   NA 146* 11/16/2011 0350   K 3.2* 11/16/2011 0350   CL 113* 11/16/2011 0350   CO2 28 11/16/2011 0350   GLUCOSE 159* 11/16/2011 0350   BUN 27* 11/16/2011 0350   CREATININE 0.82 11/16/2011 0350   CALCIUM 7.6* 11/16/2011 0350   PROT 5.3* 11/15/2011 0440   ALBUMIN 1.4* 11/15/2011 0440   AST 31 11/15/2011 0440   ALT 10 11/15/2011 0440   ALKPHOS 67 11/15/2011 0440   BILITOT 0.7 11/15/2011 0440   GFRNONAA >90 11/16/2011 0350   GFRAA >90 11/16/2011 0350     Intake/Output Summary (Last 24 hours) at 11/16/11 1207 Last data filed at 11/16/11 1100  Gross per 24 hour  Intake   4131 ml  Output   6445 ml  Net  -2314 ml    Weight Status:  63.9 kg weight stable.   Re-estimated needs:  2004 kcal, 76-95 grams protein  Nutrition Dx:  Inadequate Oral Intake, Ongoing.   Goal:  1. TPN to meet > 90% of estimated energy needs. - not yet  meeting, continue.  NEW: 1. Positive tolerance of TF.  2. Promote weight maintenance.   Monitor:  TNA per pharmacy, TF initiation/ tolerance, Extubation, Weights, Labs   Iven Finn [facility] [phone]

## 2011-11-17 ENCOUNTER — Inpatient Hospital Stay (HOSPITAL_COMMUNITY): Payer: Medicaid Other

## 2011-11-17 DIAGNOSIS — E46 Unspecified protein-calorie malnutrition: Secondary | ICD-10-CM | POA: Diagnosis present

## 2011-11-17 LAB — CBC WITH DIFFERENTIAL/PLATELET
Basophils Relative: 0 % (ref 0–1)
Eosinophils Absolute: 0.1 10*3/uL (ref 0.0–0.7)
HCT: 23.1 % — ABNORMAL LOW (ref 39.0–52.0)
Hemoglobin: 7.3 g/dL — ABNORMAL LOW (ref 13.0–17.0)
Lymphocytes Relative: 9 % — ABNORMAL LOW (ref 12–46)
MCHC: 31.6 g/dL (ref 30.0–36.0)
Neutro Abs: 11.3 10*3/uL — ABNORMAL HIGH (ref 1.7–7.7)

## 2011-11-17 LAB — BLOOD GAS, ARTERIAL
Bicarbonate: 26.4 mEq/L — ABNORMAL HIGH (ref 20.0–24.0)
FIO2: 0.3 %
O2 Saturation: 98.2 %
Patient temperature: 98.7
TCO2: 25 mmol/L (ref 0–100)
pH, Arterial: 7.436 (ref 7.350–7.450)

## 2011-11-17 LAB — URINE CULTURE: Colony Count: NO GROWTH

## 2011-11-17 LAB — BASIC METABOLIC PANEL
GFR calc Af Amer: >90 mL/min (ref >90)
GFR calc non Af Amer: >90 mL/min (ref >90)
Potassium: 3.5 mEq/L (ref 3.5–5.1)
Sodium: 146 mEq/L — ABNORMAL HIGH (ref 135–145)

## 2011-11-17 LAB — GLUCOSE, CAPILLARY
Glucose-Capillary: 118 mg/dL — ABNORMAL HIGH (ref 70–99)
Glucose-Capillary: 129 mg/dL — ABNORMAL HIGH (ref 70–99)
Glucose-Capillary: 138 mg/dL — ABNORMAL HIGH (ref 70–99)

## 2011-11-17 MED ORDER — FUROSEMIDE 10 MG/ML IJ SOLN
40.0000 mg | Freq: Three times a day (TID) | INTRAMUSCULAR | Status: DC
  Administered 2011-11-17 – 2011-11-19 (×6): 40 mg via INTRAVENOUS
  Filled 2011-11-17 (×8): qty 4

## 2011-11-17 MED ORDER — FREE WATER
200.0000 mL | Freq: Three times a day (TID) | Status: DC
  Administered 2011-11-17 – 2011-11-19 (×8): 200 mL

## 2011-11-17 MED ORDER — FUROSEMIDE 10 MG/ML IJ SOLN: 40.0000 mg | Freq: Three times a day (TID) | INTRAMUSCULAR | Status: DC

## 2011-11-17 MED ORDER — VANCOMYCIN HCL IN DEXTROSE 1-5 GM/200ML-% IV SOLN
1000.0000 mg | Freq: Three times a day (TID) | INTRAVENOUS | Status: DC
  Administered 2011-11-17 – 2011-11-19 (×6): 1000 mg via INTRAVENOUS
  Filled 2011-11-17 (×7): qty 200

## 2011-11-17 NOTE — Progress Notes (Signed)
Four family members in room with patient. Offered support. They denied needing any at this time. Will follow up.  11/17/11 1100  Clinical Encounter Type  Visited With Patient and family together  Visit Type Social support  Recommendations Follow up  Spiritual Encounters  Spiritual Needs Emotional  Stress Factors  Family Stress Factors Health changes

## 2011-11-17 NOTE — Clinical Social Work Note (Signed)
CSW attended family conference to provide support to family. They appear very aware of Pt's condition and looking out for his best interest. THey are aware CSW available for support. CSW to follow.   Doreen Salvage, LCSWA ICU/Stepdown Clinical Social Worker [facility] Cell [phone] Hours 8am-1200pm M-F

## 2011-11-17 NOTE — Progress Notes (Signed)
PULMONARY/CCM CONSULT NOTE  Requesting MD/Service: Byerly/CCS Date of admission: 9/22 Date of consult: 9/25 Reason for consultation: post op VDRF  Pt Profile:  51 yobm with history of recurrent metastatic rectal cancer underwent exploratory laparotomy, small bowel resection x2, incidental appendectomy 9/25. Was thought to have aspirated in PACU post operatively with feculent emesis reported. Required reintubation in PACU. PCCM asked to assist with vent/CCM issues.  Lines, Tubes, etc: ETT 9/25 >> 9/27>>>9/28>>> RUE PICC 9/24 >>  R [place] Portacath (longstanding)   Microbiology: Urine 9/22 >> Klebs (pansens) PCT 9/26: >200 10/1 blood >> 10/1 resp >> 10/1 urine >>  Antibiotics:  Fluconazole 9/25 >> 9/25 Zosyn 9/25 >> 9/25 Vanc 9/25>>> Micafungin 9/26>>> Imipenem 9/25>>>  Studies/Events:  Consults:  CCS (primary)  Best Practice: DVT: LMWH - dose adjusted for renal insuff SUP: PPI Nutrition: TPN Glycemic control: SSI  Sedation/analgesia: continuous sedation  SUBJ: Fever overnight  BP 118/74  Pulse 123  Temp 101.2 F (38.4 C) (Axillary)  Resp 17  Ht 5\' 8"  (1.727 m)  Wt 65.3 kg (143 lb 15.4 oz)  BMI 21.89 kg/m2  SpO2 100%   EXAM:  Gen: sedated on vent NCAT: PERRL, ETT in place PULM: rhonchi bilaterally CV: RRR, few PVC's AB: BS infrequent, soft Ext: warm, no edema Neuro: heavily sedated   IMPRESSION:   Pulm:  ABG    Component Value Date/Time   PHART 7.436 11/17/2011 0350   PCO2ART 39.9 11/17/2011 0350   PO2ART 109.0* 11/17/2011 0350   HCO3 26.4* 11/17/2011 0350   TCO2 25.0 11/17/2011 0350   ACIDBASEDEF 2.0 11/14/2011 1100   O2SAT 98.2 11/17/2011 0350   PCXR: bilateral airspace disease, improved compared to weekend.   Acute respiratory failure - post op abd surgery with likely aspiration, active smoker. Aspiration PNA  ARDS +/- element of edema, oxygenation improved greatly Pulmonary metastasis  Reintubated on 9/28, tolerating vent well for now,  continue full support .  PLAN -  Daily WUA/SBT Increase lasix dose again today Cont BDs Sedation with infusions for now See ID section  Cardiology: Severe sepsis/occult shock -- resolved  Shock resolved.  PLAN -  KVO IVF. Diurese  Renal:   Lab 11/17/11 0345 11/16/11 0350 11/15/11 0440 11/14/11 0500 11/13/11 0515  NA 146* 146* 146* -- --  CL 112 113* 113* -- --  K 3.5 3.2* 3.3* -- --  CO2 27 28 24  -- --  BUN 27* 27* 37* -- --  CREATININE 0.79 0.82 0.99 -- --  GLUCOSE 151* 159* 150* -- --  MG -- -- 2.1 1.8 1.9   Hypernatremia with likely free water loss Resolved renal failure Hypokalemia from diuresis Plan Stop TNP Increase free water in tube feedings Replace K Decrease lasix to daily  GI: S/p exp lap - small bowel resection with fecal contamination  Rectal ca now w/ mets to pelvis and lung PLAN -  Surgery following Cont VAC TPN --> enteral feedings today NG tube   HEME:  Lab 11/17/11 0345 11/16/11 0350 11/15/11 0440  HGB 7.3* 7.6* 8.1*  HCT 23.1* 23.3* 24.5*  PLT 206 142* 118*  anemia of critical illness Plan: Trigger <7 Trend CBC PPI  Endocrine CBG (last 3)   Basename 11/17/11 0804 11/17/11 0328 11/17/11 0003  GLUCAP 126* 138* 129*  Hyperglycemia Plan: ssi  Infectious disease:  Peritonitis Aspiration PNA --  Pct improved  Buttock abscess Still febrile  PLAN -  Cont broad spectrum abx  And mica Send wound culture of buttock wound  Neuro:  Sedated on vent P: Cont current sedation protocol SBT/WUA   Goals of care: Lengthy discussion with family.  His code status is no CPR, no pressors.  We will try to get him off the vent by next Tuesday.  If he is not extubated by then we will palliatively extubate.  In the event that he deteriorates (new septic shock) then we will notify his family and will not resuscitate.   Discussed with Drs. Byerly  CC time 35 minutes.  Yolonda Kida PCCM Pager: [phone] Cell: [phone] If  no response, call [phone]

## 2011-11-17 NOTE — Progress Notes (Signed)
Patient ID: Jeffery [name], male   DOB: 08/05/1960, 51 y.o.   MRN: [id number] General Surgery Note   LOS: 10 days    Assessment/Plan: Pt had kept lung mets from family.  New pathology with metastatic disease in the pelvis.    1.  LAPAROSCOPIC LYSIS OF ADHESIONS, EXPLORATORY LAPAROTOMY, Small bowel resection x 2, APPLICATION OF WOUND VAC -    On Primaxin/Vanc for spillage.    Has NGT - having flatus in ostomy bag.  Minimal NGT output.    Has wound VAC that is changed M/W/F.  Tube feeds almost at goal.   2.  Laparoscopic loop descending diverting colostomy -   3.  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 Khadija Thier, pelvic recurrence.    Dr. Leonard Schwartz. [place] treating oncologist. 4.  Tobacco abuse  5.  Acute respiratory failure following trauma and surgery.  Aspiration pneumonia   Patient re-intubated.  6.  Sepsis - controlled ? Still febrile.    WBC - 11,200 - 11/14/2011 7.  Acute renal insufficiency - improved  Creatinine - 1.13 - 11/14/2011 8.  Malnutrition  On TPN 9.  DVT proph - on Lovenox 10.  Right buttock drain   Leave while output feculent.   11.  dispo - think it is premature to do terminal wean for cancer at this point.  There is low volume of disease overall.  Would reconsider if no improvement over next 1-2 weeks, but pt is young and could potentially recover from this insult.    Subjective:  Intubated.  More alert today.   Objective:   Filed Vitals:   11/17/11 0800  BP: 118/74  Pulse: 123  Temp: 101.2 F (38.4 C)  Resp: 17     Intake/Output from previous day:  10/01 0701 - 10/02 0700 In: 3905.5 [I.V.:1015.5; NG/GT:460; IV Piggyback:610; TPN:1790] Out: 3730 [Urine:3265; Drains:15; Stool:450]  Intake/Output this shift:  Total I/O In: 150 [I.V.:40; NG/GT:40; TPN:70] Out: 120 [Urine:120]   Physical Exam:   General: WN older AA M who is intubated and sedated.    HEENT: Normal.  Pupils equal. .   Lungs: Intubated.   Abdomen: Tight.  No BS.   Wound: on wet to dry.  No bowel visible.     Lab Results:     Basename 11/17/11 0345 11/16/11 0350  WBC 13.5* 10.2  HGB 7.3* 7.6*  HCT 23.1* 23.3*  PLT 206 142*    BMET    Basename 11/17/11 0345 11/16/11 0350  NA 146* 146*  K 3.5 3.2*  CL 112 113*  CO2 27 28  GLUCOSE 151* 159*  BUN 27* 27*  CREATININE 0.79 0.82  CALCIUM 7.5* 7.6*    PT/INR   No results found for this basename: LABPROT:2,INR:2 in the last 72 hours  ABG    Basename 11/17/11 0350 11/16/11 0448  PHART 7.436 7.430  HCO3 26.4* 25.8*     Studies/Results:  Dg Chest Port 1 View  11/17/2011  *RADIOLOGY REPORT*  Clinical Data: Edema, intubated  PORTABLE CHEST - 1 VIEW  Comparison: Prior chest x-ray yesterday, 11/16/2011  Findings: Endotracheal tube is in good position 2.6 cm above the carina.  Right subclavian approach portacatheter and right upper extremity PICC are unchanged.  Both catheter tips terminate at the superior cavoatrial junction.  A nasogastric tube is present, the tip lies off the field of view, likely within the stomach. Improving aeration over the  last 24 hours with decreased interstitial edema.  There is persistent mild interstitial edema and bilateral perihilar infiltrates.  Inspiratory volumes remain low.  Unchanged cardiomegaly.  IMPRESSION:  1.  Improving, but not completely resolved pulmonary edema 2.  Stable and satisfactory support apparatus as above   Original Report Authenticated By: Alvino Blood Chest Port 1 View  11/16/2011  *RADIOLOGY REPORT*  Clinical Data: Evaluate endotracheal tube. Rectal adenocarcinoma with lung metastasis.  PORTABLE CHEST - 1 VIEW  Comparison: 1 day prior  Findings: Endotracheal tube unchanged in position, with tip 2.5 cm above carina.  Nasogastric tube extends beyond the  inferior aspect of the film.  Right-sided Port-A-Cath and right-sided PICC lines are unchanged.  Normal heart size.  Mild left  hemidiaphragm elevation.  Cannot exclude small left pleural effusion. No pneumothorax.  No significant change in bilateral interstitial and airspace opacities.  Superimposed upon pulmonary metastasis.  IMPRESSION:  1. No significant change since one day prior. 2.  Pulmonary edema versus less likely infection superimposed upon pulmonary metastasis. 3.  Stable and appropriate position of endotracheal tube.   Original Report Authenticated By: Consuello Bossier, M.D.      Anti-infectives:   Anti-infectives     Start     Dose/Rate Route Frequency Ordered Stop   11/17/11 1400   vancomycin (VANCOCIN) IVPB 1000 mg/200 mL premix        1,000 mg 200 mL/hr over 60 Minutes Intravenous Every 8 hours 11/17/11 0802     11/15/11 1400   imipenem-cilastatin (PRIMAXIN) 500 mg in sodium chloride 0.9 % 100 mL IVPB        500 mg 200 mL/hr over 30 Minutes Intravenous 3 times per day 11/15/11 0931     11/15/11 0615   vancomycin (VANCOCIN) 750 mg in sodium chloride 0.9 % 150 mL IVPB  Status:  Discontinued        750 mg 150 mL/hr over 60 Minutes Intravenous 3 times per day 11/15/11 0608 11/17/11 0801   11/14/11 1800   imipenem-cilastatin (PRIMAXIN) 250 mg in sodium chloride 0.9 % 100 mL IVPB  Status:  Discontinued        250 mg 200 mL/hr over 30 Minutes Intravenous 4 times per day 11/14/11 1434 11/15/11 0931   11/13/11 1800   vancomycin (VANCOCIN) 750 mg in sodium chloride 0.9 % 150 mL IVPB  Status:  Discontinued        750 mg 150 mL/hr over 60 Minutes Intravenous Every 12 hours 11/13/11 0631 11/15/11 0608   11/13/11 0645   vancomycin (VANCOCIN) 750 mg in sodium chloride 0.9 % 150 mL IVPB        750 mg 150 mL/hr over 60 Minutes Intravenous  Once 11/13/11 0631 11/13/11 0738   11/12/11 1200   imipenem-cilastatin (PRIMAXIN) 500 mg in sodium chloride 0.9 % 100 mL IVPB  Status:  Discontinued        500 mg 200 mL/hr over 30 Minutes Intravenous 3 times per day 11/12/11 1015 11/14/11 1434   11/11/11 2000   micafungin  (MYCAMINE) 100 mg in sodium chloride 0.9 % 100 mL IVPB     Comments: stat      100 mg 100 mL/hr over 1 Hours Intravenous Every 24 hours 11/10/11 1939     11/11/11 1800   vancomycin (VANCOCIN) 500 mg in sodium chloride 0.9 % 100 mL IVPB  Status:  Discontinued        500 mg 100 mL/hr over 60 Minutes Intravenous Every 12  hours 11/11/11 1014 11/13/11 0631   11/10/11 2000   micafungin (MYCAMINE) 100 mg in sodium chloride 0.9 % 100 mL IVPB     Comments: stat      100 mg 100 mL/hr over 1 Hours Intravenous Daily 11/10/11 1939 11/10/11 2215   11/10/11 2000   imipenem-cilastatin (PRIMAXIN) 250 mg in sodium chloride 0.9 % 100 mL IVPB  Status:  Discontinued        250 mg 200 mL/hr over 30 Minutes Intravenous 4 times per day 11/10/11 1951 11/12/11 1014   11/10/11 1630   fluconazole (DIFLUCAN) IVPB 200 mg  Status:  Discontinued        200 mg 100 mL/hr over 60 Minutes Intravenous Every 24 hours 11/10/11 1617 11/10/11 1939   11/10/11 1630   vancomycin (VANCOCIN) 500 mg in sodium chloride 0.9 % 100 mL IVPB  Status:  Discontinued        500 mg 100 mL/hr over 60 Minutes Intravenous 3 times per day 11/10/11 1617 11/11/11 1014   11/10/11 1630   piperacillin-tazobactam (ZOSYN) IVPB 3.375 g  Status:  Discontinued        3.375 g 12.5 mL/hr over 240 Minutes Intravenous 3 times per day 11/10/11 1617 11/10/11 1941   11/10/11 0933   cefOXitin (MEFOXIN) 2 g in dextrose 5 % 50 mL IVPB        2 g 100 mL/hr over 30 Minutes Intravenous 60 min pre-op 11/10/11 0926 11/10/11 1105   11/10/11 0930   cefOXitin (MEFOXIN) 1 g in dextrose 5 % 50 mL IVPB  Status:  Discontinued        1 g 100 mL/hr over 30 Minutes Intravenous 3 times per day 11/10/11 0924 11/10/11 0926   11/07/11 2200   cefTRIAXone (ROCEPHIN) 1 g in dextrose 5 % 50 mL IVPB  Status:  Discontinued        1 g 100 mL/hr over 30 Minutes Intravenous Every 24 hours 11/07/11 2124 11/10/11 1536   11/07/11 1830   cefTRIAXone (ROCEPHIN) 1 g in dextrose 5 % 50 mL  IVPB        1 g 100 mL/hr over 30 Minutes Intravenous  Once 11/07/11 1819 11/07/11 1920          Ovidio Kin, MD, FACS Pager: [phone],   [facility] Office: [phone]

## 2011-11-18 ENCOUNTER — Inpatient Hospital Stay (HOSPITAL_COMMUNITY): Payer: Medicaid Other

## 2011-11-18 LAB — BLOOD GAS, ARTERIAL
Acid-Base Excess: 3.3 mmol/L — ABNORMAL HIGH (ref 0.0–2.0)
Bicarbonate: 26.7 mEq/L — ABNORMAL HIGH (ref 20.0–24.0)
Drawn by: 244901
FIO2: 0.3 %
MECHVT: 550 mL
Patient temperature: 99.3
RATE: 16 resp/min
TCO2: 25.5 mmol/L (ref 0–100)
pH, Arterial: 7.463 — ABNORMAL HIGH (ref 7.350–7.450)

## 2011-11-18 LAB — CBC WITH DIFFERENTIAL/PLATELET
Basophils Relative: 0 % (ref 0–1)
HCT: 23.6 % — ABNORMAL LOW (ref 39.0–52.0)
Hemoglobin: 7.5 g/dL — ABNORMAL LOW (ref 13.0–17.0)
Lymphocytes Relative: 8 % — ABNORMAL LOW (ref 12–46)
Lymphs Abs: 1.7 10*3/uL (ref 0.7–4.0)
MCH: 28.6 pg (ref 26.0–34.0)
MCHC: 31.8 g/dL (ref 30.0–36.0)
MCV: 90.1 fL (ref 78.0–100.0)
Monocytes Absolute: 1.1 10*3/uL — ABNORMAL HIGH (ref 0.1–1.0)
Monocytes Relative: 5 % (ref 3–12)
Neutro Abs: 18.3 10*3/uL — ABNORMAL HIGH (ref 1.7–7.7)
Neutrophils Relative %: 87 % — ABNORMAL HIGH (ref 43–77)
Platelets: 313 10*3/uL (ref 150–400)
RDW: 17 % — ABNORMAL HIGH (ref 11.5–15.5)
WBC: 21.1 10*3/uL — ABNORMAL HIGH (ref 4.0–10.5)

## 2011-11-18 LAB — GLUCOSE, CAPILLARY
Glucose-Capillary: 103 mg/dL — ABNORMAL HIGH (ref 70–99)
Glucose-Capillary: 119 mg/dL — ABNORMAL HIGH (ref 70–99)
Glucose-Capillary: 124 mg/dL — ABNORMAL HIGH (ref 70–99)

## 2011-11-18 LAB — BASIC METABOLIC PANEL
BUN: 28 mg/dL — ABNORMAL HIGH (ref 6–23)
CO2: 28 mEq/L (ref 19–32)
Glucose, Bld: 149 mg/dL — ABNORMAL HIGH (ref 70–99)
Potassium: 3.5 mEq/L (ref 3.5–5.1)
Sodium: 143 mEq/L (ref 135–145)

## 2011-11-18 LAB — VANCOMYCIN, TROUGH: Vancomycin Tr: 18.8 ug/mL (ref 10.0–20.0)

## 2011-11-18 NOTE — Progress Notes (Signed)
CARE MANAGEMENT NOTE 11/18/2011  Patient:  Jeffery [name], Jeffery [name]   Account Number:  [id number]  Date Initiated:  11/08/2011  Documentation initiated by:  Lorenda Ishihara  Subjective/Objective Assessment:   51 yo male admitted with SBO. Prior history of SBO, rectal cancer with colon resection and colostomy. PTA lived at home with family.     Action/Plan:   Anticipated DC Date:  11/21/2011   Anticipated DC Plan:  HOME/SELF CARE      DC Planning Services  CM consult      Choice offered to / List presented to:             Status of service:   Medicare Important Message given?   (If response is "NO", the following Medicare IM given date fields will be blank) Date Medicare IM given:   Date Additional Medicare IM given:    Discharge Disposition:    Per UR Regulation:  Reviewed for med. necessity/level of care/duration of stay  If discussed at Long Length of Stay Meetings, dates discussed:    Comments:  [id number]/Tabathia Knoche Earlene Plater, RN, BSN, CCM: CHART REVIEWED AND UPDATED. NO DISCHARGE NEEDS PRESENT AT THIS TIME./wbc increased from 13-21, pt failed the am weaning trail after 4 minutes. CASE MANAGEMENT [phone]    [id number]/Virgie Chery,Rn,BSN,CCM: patient unresponsive and reintubated x3.  Terminal wean planned for Wednesday Oct. 2,2013.  [id number]/NWGNFA Earlene Plater, RN, BSN, CCM: CHART REVIEWED AND UPDATED. NO DISCHARGE NEEDS PRESENT AT THIS TIME. CASE MANAGEMENT [phone]

## 2011-11-18 NOTE — Progress Notes (Signed)
PULMONARY / CCM CONSULT NOTE  Requesting MD/Service: Byerly/CCS Date of admission: 9/22 Date of consult: 9/25 Reason for consultation: post op VDRF  Pt Profile:  51 yobm with history of recurrent metastatic rectal cancer underwent exploratory laparotomy, small bowel resection x2, incidental appendectomy 9/25. Was thought to have aspirated in PACU post operatively with feculent emesis reported. Required reintubation in PACU. PCCM asked to assist with vent/CCM issues.  Lines, Tubes, etc: ETT 9/25 >> 9/27>>>9/28>>> RUE PICC 9/24 >>  R [place] Portacath (longstanding)   Microbiology: Urine 9/22 >> Klebs (pansens) PCT 9/26: >200 10/1 blood >> 10/1 resp >> 10/1 urine >>neg  Antibiotics:  Fluconazole 9/25 >> 9/25 Zosyn 9/25 >> 9/25 Vanc 9/25>>> Micafungin 9/26>>> Imipenem 9/25>>>  Studies/Events:  Consults:  CCS (primary)  Best Practice: DVT: LMWH - dose adjusted for renal insuff SUP: PPI Nutrition: TPN Glycemic control: SSI  Sedation/analgesia: continuous sedation  SUBJ: RN reports patient failed SBT - tachycardia & tachypnea.  C/O's abd pain.    BP 108/63  Pulse 123  Temp 102 F (38.9 C) (Axillary)  Resp 18  Ht 5\' 8"  (1.727 m)  Wt 136 lb 0.4 oz (61.7 kg)  BMI 20.68 kg/m2  SpO2 99%   EXAM:  Gen: chronically ill on vent NCAT: PERRL, ETT in place PULM: rhonchi bilaterally CV: RRR, few PVC's AB: BS infrequent, mild distention, grimaces with palpation, stoma pink Ext: warm, no edema Neuro: RASS -1, moving ext spontaneously, sedate   IMPRESSION:   Pulm:  ABG    Component Value Date/Time   PHART 7.463* 11/18/2011 0324   PCO2ART 38.0 11/18/2011 0324   PO2ART 109.0* 11/18/2011 0324   HCO3 26.7* 11/18/2011 0324   TCO2 25.5 11/18/2011 0324   ACIDBASEDEF 2.0 11/14/2011 1100   O2SAT 99.3 11/18/2011 0324   PCXR: bilateral airspace disease    Acute respiratory failure - post op abd surgery with likely aspiration, active smoker. Aspiration PNA  ARDS +/- element of  edema, oxygenation improved greatly Pulmonary metastasis  Reintubated on 9/28, tolerating vent well for now, continue full support .  Failed SBT 10/3.  PLAN -  Daily WUA/SBT Continue lasix Cont BDs Sedation with infusions for now See ID section  Cardiology: Severe sepsis/occult shock -- resolved  Tachycardia - in setting of fever / pain Shock resolved.  PLAN -  KVO IVF. Diurese  Renal:   Lab 11/17/11 0345 11/16/11 0350 11/15/11 0440 11/14/11 0500 11/13/11 0515  NA 146* 146* 146* -- --  CL 112 113* 113* -- --  K 3.5 3.2* 3.3* -- --  CO2 27 28 24  -- --  BUN 27* 27* 37* -- --  CREATININE 0.79 0.82 0.99 -- --  GLUCOSE 151* 159* 150* -- --  MG -- -- 2.1 1.8 1.9   Hypernatremia with likely free water loss Resolved renal failure Hypokalemia from diuresis Plan Free water 200 Q8 Lasix Q8 Check Mag in am  GI: S/p exp lap - small bowel resection with fecal contamination  Rectal ca now w/ mets to pelvis and lung PLAN -  Surgery following Cont VAC TPN --> enteral feedings 10/2, tolerating enteral feeds NG tube  Consider CT abd with continued fever, distention   HEME:  Lab 11/17/11 0345 11/16/11 0350 11/15/11 0440  HGB 7.3* 7.6* 8.1*  HCT 23.1* 23.3* 24.5*  PLT 206 142* 118*  anemia of critical illness Plan: Trigger <7 Trend CBC PPI  Endocrine CBG (last 3)   Basename 11/17/11 2326 11/17/11 2018 11/17/11 1631  GLUCAP 118* 101* 130*  Hyperglycemia Plan: ssi  Infectious disease:  Peritonitis Aspiration PNA --  Pct improved  Buttock abscess Still febrile  PLAN -  Cont broad spectrum abx and micafungin Follow wound culture of buttock wound  Neuro: Sedated on vent P: Cont current sedation protocol SBT/WUA   Goals of care: Lengthy discussion with family was held on 10/2 with PCCM.  His code status is no CPR, no pressors.  We will try to get him off the vent by next Tuesday.  If he is not extubated by then we will palliatively extubate.  In the event  that he deteriorates (new septic shock) then we will notify his family and will not resuscitate.     Canary Brim, NP-C [facility] Pgr: [phone] or [phone]   Attending:  I have seen and examined the patient with nurse practitioner/resident and agree with the note above.   CT tomorrow if WBC continues to rise  Continue diuresis  Plan one way extubation next week if no improvement  Continues to fail SBT  Yolonda Kida PCCM Pager: [phone] Cell: [phone] If no response, call [phone]

## 2011-11-18 NOTE — Consult Note (Signed)
WOC ostomy consult  Stoma type/location: LLQ COlostomy Stomal assessment/size: 1 inch 1 and 3/8 inches oval stoma, pale pink, moist, functioning  Peristomal assessment:  Treatment options for stomal/peristomal skin:  Output soft, light brown stool Ostomy pouching: 2pc pouching system (2 and 1/4 inches)  Education provided: None.  Demonstrated for girlfriend, who appears to be very hopeful for recovery.  Patient did not respond to me during pouch change. We will remain available.  Please re-consult if needed. Thanks, Ladona Mow, MSN, RN, [facility], CWOCN [phone])

## 2011-11-18 NOTE — Progress Notes (Signed)
ANTIBIOTIC CONSULT NOTE - FOLLOW UP  Pharmacy Consult for Vancomycin and Primaxin Indication: Peritonitis, r/o aspiration pna  No Known Allergies  Patient Measurements: Height: 5\' 8"  (172.7 cm) Weight: 136 lb 0.4 oz (61.7 kg) [name] : 68.4    Vital Signs: Temp: 99.6 F (37.6 C) (10/03 1200) Temp src: Axillary (10/03 1200) BP: 100/70 mmHg (10/03 1300) Pulse Rate: 112  (10/03 1300) Intake/Output from previous day: 10/02 0701 - 10/03 0700 In: 4591 [I.V.:1063; NG/GT:1740; IV Piggyback:1018; TPN:770] Out: 4785 [Urine:3560; Stool:1225] Intake/Output from this shift: Total I/O In: 605 [I.V.:275; NG/GT:330] Out: 955 [Urine:525; Drains:5; Stool:425]  Labs:  [facility] 11/18/11 0515 11/17/11 0345 11/16/11 0350  WBC 21.1* 13.5* 10.2  HGB 7.5* 7.3* 7.6*  PLT 313 206 142*  LABCREA -- -- --  CREATININE 0.92 0.79 0.82   Estimated Creatinine Clearance: 82 ml/min (by C-G formula based on Cr of 0.92).  Basename 11/18/11 1330 11/16/11 1439  VANCOTROUGH 18.8 11.6  VANCOPEAK -- --  VANCORANDOM -- --  GENTTROUGH -- --  GENTPEAK -- --  GENTRANDOM -- --  TOBRATROUGH -- --  TOBRAPEAK -- --  TOBRARND -- --  AMIKACINPEAK -- --  AMIKACINTROU -- --  AMIKACIN -- --     Microbiology: Recent Results (from the past 720 hour(s))  URINE CULTURE     Status: Normal   Collection Time   11/07/11  4:45 PM      Component Value Range Status Comment   Specimen Description URINE, CLEAN CATCH   Final    Special Requests NONE   Final    Culture  Setup Time 11/07/2011 20:12   Final    Colony Count >=100,000 COLONIES/ML   Final    Culture KLEBSIELLA PNEUMONIAE   Final    Report Status 11/09/2011 FINAL   Final    Organism ID, Bacteria KLEBSIELLA PNEUMONIAE   Final   MRSA PCR SCREENING     Status: Normal   Collection Time   11/10/11  3:55 PM      Component Value Range Status Comment   MRSA by PCR NEGATIVE  NEGATIVE Final   CULTURE, BLOOD (ROUTINE X 2)     Status: Normal (Preliminary  result)   Collection Time   11/16/11  9:30 AM      Component Value Range Status Comment   Specimen Description BLOOD LEFT ARM   Final    Special Requests BOTTLES DRAWN AEROBIC AND ANAEROBIC 7CC   Final    Culture  Setup Time 11/16/2011 19:52   Final    Culture     Final    Value:        BLOOD CULTURE RECEIVED NO GROWTH TO DATE CULTURE WILL BE HELD FOR 5 DAYS BEFORE ISSUING A FINAL NEGATIVE REPORT   Report Status PENDING   Incomplete   CULTURE, BLOOD (ROUTINE X 2)     Status: Normal (Preliminary result)   Collection Time   11/16/11  9:40 AM      Component Value Range Status Comment   Specimen Description BLOOD LEFT HAND   Final    Special Requests BOTTLES DRAWN AEROBIC AND ANAEROBIC 10CC   Final    Culture  Setup Time 11/16/2011 19:51   Final    Culture     Final    Value:        BLOOD CULTURE RECEIVED NO GROWTH TO DATE CULTURE WILL BE HELD FOR 5 DAYS BEFORE ISSUING A FINAL NEGATIVE REPORT   Report Status PENDING   Incomplete  URINE CULTURE     Status: Normal   Collection Time   11/16/11  9:42 AM      Component Value Range Status Comment   Specimen Description URINE, CATHETERIZED   Final    Special Requests Normal   Final    Culture  Setup Time 11/17/2011 00:59   Final    Colony Count NO GROWTH   Final    Culture NO GROWTH   Final    Report Status 11/17/2011 FINAL   Final   CULTURE, RESPIRATORY     Status: Normal (Preliminary result)   Collection Time   11/16/11 12:29 PM      Component Value Range Status Comment   Specimen Description TRACHEAL ASPIRATE   Final    Special Requests Normal   Final    Gram Stain     Final    Value: RARE WBC PRESENT, PREDOMINANTLY PMN     NO SQUAMOUS EPITHELIAL CELLS SEEN     NO ORGANISMS SEEN   Culture NO GROWTH 1 DAY   Final    Report Status PENDING   Incomplete   WOUND CULTURE     Status: Normal (Preliminary result)   Collection Time   11/17/11  5:40 PM      Component Value Range Status Comment   Specimen Description PERIRECTAL   Final     Special Requests Normal   Final    Gram Stain     Final    Value: RARE WBC PRESENT,BOTH PMN AND MONONUCLEAR     NO SQUAMOUS EPITHELIAL CELLS SEEN     NO ORGANISMS SEEN   Culture NO GROWTH   Final    Report Status PENDING   Incomplete     Anti-infectives     Start     Dose/Rate Route Frequency Ordered Stop   11/17/11 1400   vancomycin (VANCOCIN) IVPB 1000 mg/200 mL premix        1,000 mg 200 mL/hr over 60 Minutes Intravenous Every 8 hours 11/17/11 0802     11/15/11 1400   imipenem-cilastatin (PRIMAXIN) 500 mg in sodium chloride 0.9 % 100 mL IVPB        500 mg 200 mL/hr over 30 Minutes Intravenous 3 times per day 11/15/11 0931     11/15/11 0615   vancomycin (VANCOCIN) 750 mg in sodium chloride 0.9 % 150 mL IVPB  Status:  Discontinued        750 mg 150 mL/hr over 60 Minutes Intravenous 3 times per day 11/15/11 0608 11/17/11 0801   11/14/11 1800   imipenem-cilastatin (PRIMAXIN) 250 mg in sodium chloride 0.9 % 100 mL IVPB  Status:  Discontinued        250 mg 200 mL/hr over 30 Minutes Intravenous 4 times per day 11/14/11 1434 11/15/11 0931   11/13/11 1800   vancomycin (VANCOCIN) 750 mg in sodium chloride 0.9 % 150 mL IVPB  Status:  Discontinued        750 mg 150 mL/hr over 60 Minutes Intravenous Every 12 hours 11/13/11 0631 11/15/11 0608   11/13/11 0645   vancomycin (VANCOCIN) 750 mg in sodium chloride 0.9 % 150 mL IVPB        750 mg 150 mL/hr over 60 Minutes Intravenous  Once 11/13/11 0631 11/13/11 0738   11/12/11 1200   imipenem-cilastatin (PRIMAXIN) 500 mg in sodium chloride 0.9 % 100 mL IVPB  Status:  Discontinued        500 mg 200 mL/hr over 30 Minutes Intravenous 3 times  per day 11/12/11 1015 11/14/11 1434   11/11/11 2000   micafungin (MYCAMINE) 100 mg in sodium chloride 0.9 % 100 mL IVPB     Comments: stat      100 mg 100 mL/hr over 1 Hours Intravenous Every 24 hours 11/10/11 1939     11/11/11 1800   vancomycin (VANCOCIN) 500 mg in sodium chloride 0.9 % 100 mL IVPB   Status:  Discontinued        500 mg 100 mL/hr over 60 Minutes Intravenous Every 12 hours 11/11/11 1014 11/13/11 0631   11/10/11 2000   micafungin (MYCAMINE) 100 mg in sodium chloride 0.9 % 100 mL IVPB     Comments: stat      100 mg 100 mL/hr over 1 Hours Intravenous Daily 11/10/11 1939 11/10/11 2215   11/10/11 2000   imipenem-cilastatin (PRIMAXIN) 250 mg in sodium chloride 0.9 % 100 mL IVPB  Status:  Discontinued        250 mg 200 mL/hr over 30 Minutes Intravenous 4 times per day 11/10/11 1951 11/12/11 1014   11/10/11 1630   fluconazole (DIFLUCAN) IVPB 200 mg  Status:  Discontinued        200 mg 100 mL/hr over 60 Minutes Intravenous Every 24 hours 11/10/11 1617 11/10/11 1939   11/10/11 1630   vancomycin (VANCOCIN) 500 mg in sodium chloride 0.9 % 100 mL IVPB  Status:  Discontinued        500 mg 100 mL/hr over 60 Minutes Intravenous 3 times per day 11/10/11 1617 11/11/11 1014   11/10/11 1630   piperacillin-tazobactam (ZOSYN) IVPB 3.375 g  Status:  Discontinued        3.375 g 12.5 mL/hr over 240 Minutes Intravenous 3 times per day 11/10/11 1617 11/10/11 1941   11/10/11 0933   cefOXitin (MEFOXIN) 2 g in dextrose 5 % 50 mL IVPB        2 g 100 mL/hr over 30 Minutes Intravenous 60 min pre-op 11/10/11 0926 11/10/11 1105   11/10/11 0930   cefOXitin (MEFOXIN) 1 g in dextrose 5 % 50 mL IVPB  Status:  Discontinued        1 g 100 mL/hr over 30 Minutes Intravenous 3 times per day 11/10/11 0924 11/10/11 0926   11/07/11 2200   cefTRIAXone (ROCEPHIN) 1 g in dextrose 5 % 50 mL IVPB  Status:  Discontinued        1 g 100 mL/hr over 30 Minutes Intravenous Every 24 hours 11/07/11 2124 11/10/11 1536   11/07/11 1830   cefTRIAXone (ROCEPHIN) 1 g in dextrose 5 % 50 mL IVPB        1 g 100 mL/hr over 30 Minutes Intravenous  Once 11/07/11 1819 11/07/11 1920          Assessment:  51 YOM on Day #8 Vancomycin and Primaxin per pharmacy for r/o aspiration pna and peritonitis. Pt is also on micafungin  per MD.  Vanc trough 18.8mg /l today.   Tm 102, WBC rising. SCr wnl - ARF resolved.  No cultures negative since 9/22 Klebsiella in urine.  Goal of Therapy:  Vancomycin trough level 15-20 mcg/ml Appropriate dose of Primaxin  Plan:   Cont Vanc1g IV q8h.  Cont Primaxin 500mg  IV q8h.  Recheck VT at Css if indicated.  Continue to monitor and adjust doses as necessary  Charolotte Eke, PharmD, pager [phone]. 11/18/2011,2:36 PM.

## 2011-11-18 NOTE — Progress Notes (Signed)
8 Days Post-Op  Subjective: Poor results from weaning trial this month.    Objective: Vital signs in last 24 hours: Temp:  [99.3 F (37.4 C)-102 F (38.9 C)] 102 F (38.9 C) (10/03 0800) Pulse Rate:  [100-138] 135  (10/03 0833) Resp:  [15-27] 27  (10/03 0833) BP: (98-128)/(62-84) 98/64 mmHg (10/03 0833) SpO2:  [99 %-100 %] 100 % (10/03 0833) FiO2 (%):  [30 %] 30 % (10/03 0833) Weight:  [136 lb 0.4 oz (61.7 kg)] 136 lb 0.4 oz (61.7 kg) (10/02 2316) Last BM Date:  (COLOSTOMY)  Intake/Output from previous day: 10/02 0701 - 10/03 0700 In: 4489 [I.V.:1016; RU/EA:5409; IV Piggyback:1018; TPN:770] Out: 4785 [Urine:3560; Stool:1225] Intake/Output this shift:    General appearance: mild distress GI: soft, sl distended, vac in place Extremities: extremities normal, atraumatic, no cyanosis or edema  Lab Results:   [facility] 11/18/11 0515 11/17/11 0345  WBC 21.1* 13.5*  HGB 7.5* 7.3*  HCT 23.6* 23.1*  PLT 313 206   BMET  Basename 11/18/11 0515 11/17/11 0345  NA 143 146*  K 3.5 3.5  CL 108 112  CO2 28 27  GLUCOSE 149* 151*  BUN 28* 27*  CREATININE 0.92 0.79  CALCIUM 7.8* 7.5*   PT/INR No results found for this basename: LABPROT:2,INR:2 in the last 72 hours ABG  Basename 11/18/11 0324 11/17/11 0350  PHART 7.463* 7.436  HCO3 26.7* 26.4*    Studies/Results: Dg Chest Port 1 View  11/18/2011  *RADIOLOGY REPORT*  Clinical Data: Evaluate endotracheal tube positioning  PORTABLE CHEST - 1 VIEW  Comparison: 11/17/2011; 11/16/2011; 11/15/2011  Findings: Grossly unchanged cardiac silhouette and mediastinal contours.  Stable positioning of support apparatus.  Lung volumes remain persistently reduced.  Known scattered bilateral pulmonary nodules/masses are grossly unchanged.  Grossly unchanged bibasilar heterogeneous opacities, likely atelectasis.  No new focal airspace opacities.  No definite pleural effusion or pneumothorax. Unchanged bones.  IMPRESSION: 1.  Stable positioning of  support apparatus.  No pneumothorax. 2.  Grossly unchanged bilateral pulmonary metastasis.   Original Report Authenticated By: Waynard Reeds, M.D.    Dg Chest Port 1 View  11/17/2011  *RADIOLOGY REPORT*  Clinical Data: Edema, intubated  PORTABLE CHEST - 1 VIEW  Comparison: Prior chest x-ray yesterday, 11/16/2011  Findings: Endotracheal tube is in good position 2.6 cm above the carina.  Right subclavian approach portacatheter and right upper extremity PICC are unchanged.  Both catheter tips terminate at the superior cavoatrial junction.  A nasogastric tube is present, the tip lies off the field of view, likely within the stomach. Improving aeration over the last 24 hours with decreased interstitial edema.  There is persistent mild interstitial edema and bilateral perihilar infiltrates.  Inspiratory volumes remain low.  Unchanged cardiomegaly.  IMPRESSION:  1.  Improving, but not completely resolved pulmonary edema 2.  Stable and satisfactory support apparatus as above   Original Report Authenticated By: Vilma Prader     Anti-infectives: Anti-infectives     Start     Dose/Rate Route Frequency Ordered Stop   11/17/11 1400   vancomycin (VANCOCIN) IVPB 1000 mg/200 mL premix        1,000 mg 200 mL/hr over 60 Minutes Intravenous Every 8 hours 11/17/11 0802     11/15/11 1400   imipenem-cilastatin (PRIMAXIN) 500 mg in sodium chloride 0.9 % 100 mL IVPB        500 mg 200 mL/hr over 30 Minutes Intravenous 3 times per day 11/15/11 0931     11/15/11 0615  vancomycin (VANCOCIN) 750 mg in sodium chloride 0.9 % 150 mL IVPB  Status:  Discontinued        750 mg 150 mL/hr over 60 Minutes Intravenous 3 times per day 11/15/11 0608 11/17/11 0801   11/14/11 1800   imipenem-cilastatin (PRIMAXIN) 250 mg in sodium chloride 0.9 % 100 mL IVPB  Status:  Discontinued        250 mg 200 mL/hr over 30 Minutes Intravenous 4 times per day 11/14/11 1434 11/15/11 0931   11/13/11 1800   vancomycin (VANCOCIN) 750 mg in sodium chloride  0.9 % 150 mL IVPB  Status:  Discontinued        750 mg 150 mL/hr over 60 Minutes Intravenous Every 12 hours 11/13/11 0631 11/15/11 0608   11/13/11 0645   vancomycin (VANCOCIN) 750 mg in sodium chloride 0.9 % 150 mL IVPB        750 mg 150 mL/hr over 60 Minutes Intravenous  Once 11/13/11 0631 11/13/11 0738   11/12/11 1200   imipenem-cilastatin (PRIMAXIN) 500 mg in sodium chloride 0.9 % 100 mL IVPB  Status:  Discontinued        500 mg 200 mL/hr over 30 Minutes Intravenous 3 times per day 11/12/11 1015 11/14/11 1434   11/11/11 2000   micafungin (MYCAMINE) 100 mg in sodium chloride 0.9 % 100 mL IVPB     Comments: stat      100 mg 100 mL/hr over 1 Hours Intravenous Every 24 hours 11/10/11 1939     11/11/11 1800   vancomycin (VANCOCIN) 500 mg in sodium chloride 0.9 % 100 mL IVPB  Status:  Discontinued        500 mg 100 mL/hr over 60 Minutes Intravenous Every 12 hours 11/11/11 1014 11/13/11 0631   11/10/11 2000   micafungin (MYCAMINE) 100 mg in sodium chloride 0.9 % 100 mL IVPB     Comments: stat      100 mg 100 mL/hr over 1 Hours Intravenous Daily 11/10/11 1939 11/10/11 2215   11/10/11 2000   imipenem-cilastatin (PRIMAXIN) 250 mg in sodium chloride 0.9 % 100 mL IVPB  Status:  Discontinued        250 mg 200 mL/hr over 30 Minutes Intravenous 4 times per day 11/10/11 1951 11/12/11 1014   11/10/11 1630   fluconazole (DIFLUCAN) IVPB 200 mg  Status:  Discontinued        200 mg 100 mL/hr over 60 Minutes Intravenous Every 24 hours 11/10/11 1617 11/10/11 1939   11/10/11 1630   vancomycin (VANCOCIN) 500 mg in sodium chloride 0.9 % 100 mL IVPB  Status:  Discontinued        500 mg 100 mL/hr over 60 Minutes Intravenous 3 times per day 11/10/11 1617 11/11/11 1014   11/10/11 1630   piperacillin-tazobactam (ZOSYN) IVPB 3.375 g  Status:  Discontinued        3.375 g 12.5 mL/hr over 240 Minutes Intravenous 3 times per day 11/10/11 1617 11/10/11 1941   11/10/11 0933   cefOXitin (MEFOXIN) 2 g in  dextrose 5 % 50 mL IVPB        2 g 100 mL/hr over 30 Minutes Intravenous 60 min pre-op 11/10/11 0926 11/10/11 1105   11/10/11 0930   cefOXitin (MEFOXIN) 1 g in dextrose 5 % 50 mL IVPB  Status:  Discontinued        1 g 100 mL/hr over 30 Minutes Intravenous 3 times per day 11/10/11 0924 11/10/11 0926   11/07/11 2200   cefTRIAXone (ROCEPHIN)  1 g in dextrose 5 % 50 mL IVPB  Status:  Discontinued        1 g 100 mL/hr over 30 Minutes Intravenous Every 24 hours 11/07/11 2124 11/10/11 1536   11/07/11 1830   cefTRIAXone (ROCEPHIN) 1 g in dextrose 5 % 50 mL IVPB        1 g 100 mL/hr over 30 Minutes Intravenous  Once 11/07/11 1819 11/07/11 1920          Assessment/Plan: s/p Procedure(s) (LRB) with comments: LAPAROSCOPIC LYSIS OF ADHESIONS (N/A) - laparoscopic versus open lysis of adhesions EXPLORATORY LAPAROTOMY (N/A) - SMALL BOWEL RESECTION x 2 APPLICATION OF WOUND VAC () wound vac Antibiotics. Tube feeds. CT if WBCs continue to rise.    LOS: 11 days    Numair Masden 11/18/2011

## 2011-11-18 NOTE — Progress Notes (Addendum)
Nutrition Follow-up  Intervention:  TF: Oxepa at goal of 55 ml/hr providing:  1980 kcal, 83 gm protein, 1036 kcal and meet 89% est energy needs and 100% estimated protein needs.  Pt working towards extubation.  Oxepa at 60 ml/hr would better meet estimated needs but will not increase at this time due to attempts to wean.  Needs currently increased due to fever.  Continue to monitor.  Assessment:   Pt remains intubated on mechanical ventilation.  S/P small bowel resection x 2, wound vac, 1225 ml stool from colostomy in the last 24 hours.  Discussed with RN in rounds.  Pt tolerating TF well.  TF: Oxepa at 55 ml/hr per NG tube.  Diet Order:  NPO  Meds: Scheduled Meds:   . antiseptic oral rinse  15 mL Mouth Rinse QID  . chlorhexidine  15 mL Mouth Rinse BID  . feeding supplement (OXEPA)  1,000 mL Per Tube Q24H  . free water  200 mL Per Tube Q8H  . furosemide  40 mg Intravenous Q8H  . imipenem-cilastatin  500 mg Intravenous Q8H  . insulin aspart  0-15 Units Subcutaneous Q4H  . levalbuterol  0.63 mg Nebulization Q6H  . metoprolol  5 mg Intravenous Q6H  . micafungin (MYCAMINE) IV  100 mg Intravenous Q24H  . pantoprazole (PROTONIX) IV  40 mg Intravenous QHS  . vancomycin  1,000 mg Intravenous Q8H  . DISCONTD: furosemide  40 mg Intravenous Daily  . DISCONTD: furosemide  40 mg Intravenous Q8H   Continuous Infusions:   . dextrose 20 mL/hr at 11/18/11 0513  . fentaNYL infusion INTRAVENOUS 50 mcg/hr (11/18/11 0900)  . midazolam (VERSED) infusion 2 mg/hr (11/18/11 0900)  . TPN (CLINIMIX) +/- additives 70 mL/hr at 11/16/11 1851   PRN Meds:.acetaminophen (TYLENOL) oral liquid 160 mg/5 mL, fentaNYL, fentaNYL, hydrALAZINE, levalbuterol, metoprolol, midazolam, ondansetron, pneumococcal 23 valent vaccine, sodium chloride, sodium chloride  Labs:  CMP     Component Value Date/Time   NA 143 11/18/2011 0515   K 3.5 11/18/2011 0515   CL 108 11/18/2011 0515   CO2 28 11/18/2011 0515   GLUCOSE 149*  11/18/2011 0515   BUN 28* 11/18/2011 0515   CREATININE 0.92 11/18/2011 0515   CALCIUM 7.8* 11/18/2011 0515   PROT 5.3* 11/15/2011 0440   ALBUMIN 1.4* 11/15/2011 0440   AST 31 11/15/2011 0440   ALT 10 11/15/2011 0440   ALKPHOS 67 11/15/2011 0440   BILITOT 0.7 11/15/2011 0440   GFRNONAA >90 11/18/2011 0515   GFRAA >90 11/18/2011 0515     Intake/Output Summary (Last 24 hours) at 11/18/11 1004 Last data filed at 11/18/11 0900  Gross per 24 hour  Intake   4324 ml  Output   4555 ml  Net   -231 ml    Weight Status:  61.7 kg (range 58.5 kg-68.4kg)  Re-estimated needs:  2221 kcal, 76-95 gm protein daily  Nutrition Dx:  Inadequate oral intake-ongoing  Goal:  1. TPN to meet > 90% of estimated energy needs. - not yet meeting, continue. - met and d/ced NEW:  1. Positive tolerance of TF.  2. Promote weight maintenance.    Monitor:  TF tolerance, extubation, weight, labs   Oran Rein, RD, LDN Clinical Inpatient Dietitian Pager:  [phone] Weekend and after hours pager:  [phone]

## 2011-11-19 ENCOUNTER — Inpatient Hospital Stay (HOSPITAL_COMMUNITY): Payer: Medicaid Other

## 2011-11-19 DIAGNOSIS — K651 Peritoneal abscess: Secondary | ICD-10-CM | POA: Diagnosis not present

## 2011-11-19 DIAGNOSIS — E46 Unspecified protein-calorie malnutrition: Secondary | ICD-10-CM

## 2011-11-19 LAB — GLUCOSE, CAPILLARY
Glucose-Capillary: 117 mg/dL — ABNORMAL HIGH (ref 70–99)
Glucose-Capillary: 123 mg/dL — ABNORMAL HIGH (ref 70–99)
Glucose-Capillary: 135 mg/dL — ABNORMAL HIGH (ref 70–99)
Glucose-Capillary: 110 mg/dL — ABNORMAL HIGH (ref 70–99)
Glucose-Capillary: 111 mg/dL — ABNORMAL HIGH (ref 70–99)

## 2011-11-19 LAB — CBC
HCT: 20.6 % — ABNORMAL LOW (ref 39.0–52.0)
Hemoglobin: 6.7 g/dL — CL (ref 13.0–17.0)
MCHC: 32.5 g/dL (ref 30.0–36.0)
RBC: 2.3 MIL/uL — ABNORMAL LOW (ref 4.22–5.81)

## 2011-11-19 LAB — BASIC METABOLIC PANEL
BUN: 27 mg/dL — ABNORMAL HIGH (ref 6–23)
CO2: 28 mEq/L (ref 19–32)
Calcium: 7.6 mg/dL — ABNORMAL LOW (ref 8.4–10.5)
Creatinine, Ser: 0.79 mg/dL (ref 0.50–1.35)
Glucose, Bld: 129 mg/dL — ABNORMAL HIGH (ref 70–99)
Sodium: 146 mEq/L — ABNORMAL HIGH (ref 135–145)

## 2011-11-19 LAB — PREPARE RBC (CROSSMATCH)

## 2011-11-19 LAB — CULTURE, RESPIRATORY W GRAM STAIN

## 2011-11-19 MED ORDER — MIDAZOLAM HCL 2 MG/2ML IJ SOLN: 1.0000 mg | INTRAMUSCULAR | Status: DC | PRN

## 2011-11-19 MED ORDER — MAGNESIUM SULFATE 40 MG/ML IJ SOLN
2.0000 g | Freq: Once | INTRAMUSCULAR | Status: AC
  Administered 2011-11-19: 2 g via INTRAVENOUS
  Filled 2011-11-19: qty 50

## 2011-11-19 MED ORDER — POTASSIUM CHLORIDE 10 MEQ/50ML IV SOLN
10.0000 meq | INTRAVENOUS | Status: AC
  Administered 2011-11-19 (×4): 10 meq via INTRAVENOUS
  Filled 2011-11-19 (×4): qty 50

## 2011-11-19 MED ORDER — FENTANYL CITRATE 0.05 MG/ML IJ SOLN
25.0000 ug | INTRAMUSCULAR | Status: DC | PRN
  Administered 2011-11-19 – 2011-11-20 (×7): 100 ug via INTRAVENOUS
  Administered 2011-11-20: 50 ug via INTRAVENOUS
  Filled 2011-11-19 (×8): qty 2

## 2011-11-19 MED ORDER — ONDANSETRON HCL 4 MG/2ML IJ SOLN: 4.0000 mg | Freq: Three times a day (TID) | INTRAMUSCULAR | Status: DC | PRN

## 2011-11-19 MED ORDER — POTASSIUM CHLORIDE 20 MEQ/15ML (10%) PO LIQD
40.0000 meq | Freq: Two times a day (BID) | ORAL | Status: AC
  Administered 2011-11-19 – 2011-11-20 (×4): 40 meq
  Filled 2011-11-19 (×4): qty 30

## 2011-11-19 MED ORDER — IOHEXOL 300 MG/ML  SOLN
100.0000 mL | Freq: Once | INTRAMUSCULAR | Status: AC | PRN
  Administered 2011-11-19: 100 mL via INTRAVENOUS

## 2011-11-19 MED ORDER — MIDAZOLAM HCL 5 MG/ML IJ SOLN
1.0000 mg | INTRAMUSCULAR | Status: DC | PRN
  Administered 2011-11-19 – 2011-11-20 (×5): 2 mg via INTRAVENOUS
  Filled 2011-11-19 (×5): qty 1

## 2011-11-19 MED ORDER — FUROSEMIDE 10 MG/ML IJ SOLN
40.0000 mg | Freq: Two times a day (BID) | INTRAMUSCULAR | Status: DC
  Administered 2011-11-20 – 2011-11-23 (×7): 40 mg via INTRAVENOUS
  Filled 2011-11-19 (×10): qty 4

## 2011-11-19 NOTE — Progress Notes (Signed)
eLink Physician-Brief Progress Note Patient Name: Jeffery [name] DOB: Jul 12, 1960 MRN: [id number]  Date of Service  11/19/2011   HPI/Events of Note  Hgb down to 6.7 from 7.5. Potassium 3.2  eICU Interventions  Plan: Transfuse 1 unit of pRBC Replace potassium   Intervention Category Intermediate Interventions: Bleeding - evaluation and treatment with blood products;Electrolyte abnormality - evaluation and management  DETERDING,ELIZABETH 11/19/2011, 5:19 AM

## 2011-11-19 NOTE — Progress Notes (Signed)
CRITICAL VALUE ALERT  Critical value received: hgb 6.7 Date of notification:  11/19/11 Time of notification: 0459 Critical value read back:yes  Nurse who received alert:  c Rudine Rieger  MD notified (1st page):  Elink  MD Already aware and orders written Time of first page:  0520 MD notified (2nd page):  Time of second page:  Responding MD:  Already aware Time MD responded:  [phone]

## 2011-11-19 NOTE — Progress Notes (Signed)
Patient ID: [name], male   DOB: May 09, 1960, 51 y.o.   MRN: [id number] 9 Days Post-Op  Subjective: Failed SBT again yesterday.  Denying pain today.      Objective: Vital signs in last 24 hours: Temp:  [98.6 F (37 C)-102 F (38.9 C)] 98.6 F (37 C) (10/04 0400) Pulse Rate:  [88-135] 118  (10/04 0600) Resp:  [16-27] 20  (10/04 0600) BP: (98-124)/(64-80) 116/80 mmHg (10/04 0600) SpO2:  [100 %] 100 % (10/04 0600) FiO2 (%):  [30 %] 30 % (10/04 0600) Weight:  [129 lb 3 oz (58.6 kg)] 129 lb 3 oz (58.6 kg) (10/04 0400) Last BM Date:  (colostomy)  Intake/Output from previous day: 10/03 0701 - 10/04 0700 In: 3713 [I.V.:866; NG/GT:1725; IV Piggyback:1122] Out: 4180 [Urine:2775; Drains:5; Stool:1400] Intake/Output this shift:    General appearance: mild distress GI: very soft, sl distended, vac in place. Stool, gas in ostomy bag. Extremities: extremities normal, atraumatic, no cyanosis or edema  Lab Results:   Basename 11/19/11 0400 11/18/11 0515  WBC 19.8* 21.1*  HGB 6.7* 7.5*  HCT 20.6* 23.6*  PLT 409* 313   BMET  Basename 11/19/11 0400 11/18/11 0515  NA 146* 143  K 3.2* 3.5  CL 110 108  CO2 28 28  GLUCOSE 129* 149*  BUN 27* 28*  CREATININE 0.79 0.92  CALCIUM 7.6* 7.8*   PT/INR No results found for this basename: LABPROT:2,INR:2 in the last 72 hours ABG  Basename 11/18/11 0324 11/17/11 0350  PHART 7.463* 7.436  HCO3 26.7* 26.4*    Studies/Results: Dg Chest Port 1 View  11/19/2011  *RADIOLOGY REPORT*  Clinical Data: ETT placement, mets  PORTABLE CHEST - 1 VIEW  Comparison: 10.  1013  Findings: Endotracheal tube terminates 3 cm above the carina.  Low lung volumes with increased interstitial markings.  Right lower lobe nodule.  Additional smaller bilateral nodular opacities/metastases, better visualized on CT. The effusion  Cardiomediastinal silhouette is within normal limits.  Stable right chest power port and right arm PICC.  IMPRESSION: Endotracheal tube  terminates 3 cm above the carina.  Otherwise unchanged.   Original Report Authenticated By: Charline Bills, M.D.    Dg Chest Port 1 View  11/18/2011  *RADIOLOGY REPORT*  Clinical Data: Evaluate endotracheal tube positioning  PORTABLE CHEST - 1 VIEW  Comparison: 11/17/2011; 11/16/2011; 11/15/2011  Findings: Grossly unchanged cardiac silhouette and mediastinal contours.  Stable positioning of support apparatus.  Lung volumes remain persistently reduced.  Known scattered bilateral pulmonary nodules/masses are grossly unchanged.  Grossly unchanged bibasilar heterogeneous opacities, likely atelectasis.  No new focal airspace opacities.  No definite pleural effusion or pneumothorax. Unchanged bones.  IMPRESSION: 1.  Stable positioning of support apparatus.  No pneumothorax. 2.  Grossly unchanged bilateral pulmonary metastasis.   Original Report Authenticated By: Waynard Reeds, M.D.     Anti-infectives: Anti-infectives     Start     Dose/Rate Route Frequency Ordered Stop   11/17/11 1400   vancomycin (VANCOCIN) IVPB 1000 mg/200 mL premix        1,000 mg 200 mL/hr over 60 Minutes Intravenous Every 8 hours 11/17/11 0802     11/15/11 1400   imipenem-cilastatin (PRIMAXIN) 500 mg in sodium chloride 0.9 % 100 mL IVPB        500 mg 200 mL/hr over 30 Minutes Intravenous 3 times per day 11/15/11 0931     11/15/11 0615   vancomycin (VANCOCIN) 750 mg in sodium chloride 0.9 % 150 mL IVPB  Status:  Discontinued        750 mg 150 mL/hr over 60 Minutes Intravenous 3 times per day 11/15/11 0608 11/17/11 0801   11/14/11 1800   imipenem-cilastatin (PRIMAXIN) 250 mg in sodium chloride 0.9 % 100 mL IVPB  Status:  Discontinued        250 mg 200 mL/hr over 30 Minutes Intravenous 4 times per day 11/14/11 1434 11/15/11 0931   11/13/11 1800   vancomycin (VANCOCIN) 750 mg in sodium chloride 0.9 % 150 mL IVPB  Status:  Discontinued        750 mg 150 mL/hr over 60 Minutes Intravenous Every 12 hours 11/13/11 0631  11/15/11 0608   11/13/11 0645   vancomycin (VANCOCIN) 750 mg in sodium chloride 0.9 % 150 mL IVPB        750 mg 150 mL/hr over 60 Minutes Intravenous  Once 11/13/11 0631 11/13/11 0738   11/12/11 1200   imipenem-cilastatin (PRIMAXIN) 500 mg in sodium chloride 0.9 % 100 mL IVPB  Status:  Discontinued        500 mg 200 mL/hr over 30 Minutes Intravenous 3 times per day 11/12/11 1015 11/14/11 1434   11/11/11 2000   micafungin (MYCAMINE) 100 mg in sodium chloride 0.9 % 100 mL IVPB     Comments: stat      100 mg 100 mL/hr over 1 Hours Intravenous Every 24 hours 11/10/11 1939     11/11/11 1800   vancomycin (VANCOCIN) 500 mg in sodium chloride 0.9 % 100 mL IVPB  Status:  Discontinued        500 mg 100 mL/hr over 60 Minutes Intravenous Every 12 hours 11/11/11 1014 11/13/11 0631   11/10/11 2000   micafungin (MYCAMINE) 100 mg in sodium chloride 0.9 % 100 mL IVPB     Comments: stat      100 mg 100 mL/hr over 1 Hours Intravenous Daily 11/10/11 1939 11/10/11 2215   11/10/11 2000   imipenem-cilastatin (PRIMAXIN) 250 mg in sodium chloride 0.9 % 100 mL IVPB  Status:  Discontinued        250 mg 200 mL/hr over 30 Minutes Intravenous 4 times per day 11/10/11 1951 11/12/11 1014   11/10/11 1630   fluconazole (DIFLUCAN) IVPB 200 mg  Status:  Discontinued        200 mg 100 mL/hr over 60 Minutes Intravenous Every 24 hours 11/10/11 1617 11/10/11 1939   11/10/11 1630   vancomycin (VANCOCIN) 500 mg in sodium chloride 0.9 % 100 mL IVPB  Status:  Discontinued        500 mg 100 mL/hr over 60 Minutes Intravenous 3 times per day 11/10/11 1617 11/11/11 1014   11/10/11 1630   piperacillin-tazobactam (ZOSYN) IVPB 3.375 g  Status:  Discontinued        3.375 g 12.5 mL/hr over 240 Minutes Intravenous 3 times per day 11/10/11 1617 11/10/11 1941   11/10/11 0933   cefOXitin (MEFOXIN) 2 g in dextrose 5 % 50 mL IVPB        2 g 100 mL/hr over 30 Minutes Intravenous 60 min pre-op 11/10/11 0926 11/10/11 1105   11/10/11  0930   cefOXitin (MEFOXIN) 1 g in dextrose 5 % 50 mL IVPB  Status:  Discontinued        1 g 100 mL/hr over 30 Minutes Intravenous 3 times per day 11/10/11 0924 11/10/11 0926   11/07/11 2200   cefTRIAXone (ROCEPHIN) 1 g in dextrose 5 % 50 mL IVPB  Status:  Discontinued  1 g 100 mL/hr over 30 Minutes Intravenous Every 24 hours 11/07/11 2124 11/10/11 1536   11/07/11 1830   cefTRIAXone (ROCEPHIN) 1 g in dextrose 5 % 50 mL IVPB        1 g 100 mL/hr over 30 Minutes Intravenous  Once 11/07/11 1819 11/07/11 1920          Assessment/Plan: s/p Procedure(s) (LRB) with comments: LAPAROSCOPIC LYSIS OF ADHESIONS (N/A) - laparoscopic versus open lysis of adhesions EXPLORATORY LAPAROTOMY (N/A) - SMALL BOWEL RESECTION x 2 APPLICATION OF WOUND VAC () wound vac Antibiotics for contamination and aspiration pna.  Tube feeds. Probable ct today.  Consider ct chest as well.   LOS: 12 days    Selda Jalbert 11/19/2011

## 2011-11-19 NOTE — Progress Notes (Signed)
PULMONARY / CCM CONSULT NOTE  Requesting MD/Service: Byerly/CCS Date of admission: 9/22 Date of consult: 9/25 Reason for consultation: post op VDRF  Pt Profile:  51 yobm with history of recurrent metastatic rectal cancer underwent exploratory laparotomy, small bowel resection x2, incidental appendectomy 9/25. Was thought to have aspirated in PACU post operatively with feculent emesis reported. Required reintubation in PACU. PCCM asked to assist with vent/CCM issues.  Lines, Tubes, etc: ETT 9/25 >> 9/27>>>9/28>>> RUE PICC 9/24 >>  R [place] Portacath (longstanding)   Microbiology: Urine 9/22 >> Klebs (pansens) PCT 9/26: >200 10/1 blood >> 10/1 resp >> 10/1 urine >>neg 10/2 wound >> neg  Antibiotics:  Fluconazole 9/25 >> 9/25 Zosyn 9/25 >> 9/25 Vanc 9/25>>>10/4 Micafungin 9/26>>> Imipenem 9/25>>>  Studies/Events: 10/4 CT Abd >>   Consults:  CCS (primary)  Best Practice: DVT: scd SUP: PPI Nutrition: enteral feedings Glycemic control: SSI  Sedation/analgesia: continuous sedation  SUBJ: Failed SBT this AM, but mental status much improved this morning WBC up, Hgb down.    BP 122/82  Pulse 126  Temp 99.7 F (37.6 C) (Oral)  Resp 18  Ht 5\' 8"  (1.727 m)  Wt 58.6 kg (129 lb 3 oz)  BMI 19.64 kg/m2  SpO2 100%   EXAM:  Gen: interactive on vent HEENT: NCAT, PERRL PULM: few crackles in bases CV : Tachy, regular AB: BS+, wound vac Ext: warm, scd's Neuro: following commands, awake  IMPRESSION:   Pulm:  ABG    Component Value Date/Time   PHART 7.463* 11/18/2011 0324   PCO2ART 38.0 11/18/2011 0324   PO2ART 109.0* 11/18/2011 0324   HCO3 26.7* 11/18/2011 0324   TCO2 25.5 11/18/2011 0324   ACIDBASEDEF 2.0 11/14/2011 1100   O2SAT 99.3 11/18/2011 0324   PCXR: bilateral airspace disease  Without change  Acute respiratory failure - post op abd surgery with likely aspiration, active smoker. Aspiration PNA resolved ARDS +/- element of edema, oxygenation improved  greatly Pulmonary metastasis  Reintubated on 9/28, tolerating vent well for now, continue full support .  Failed SBT 10/4.  PLAN -  Re-attempt SBT while sitting up in bed Change sedation to prn Daily SBT If not off vent next week family wants palliative extubation Continue lasix (bid) Cont BDs See ID section  Cardiology: Severe sepsis/occult shock -- resolved  Tachycardia - in setting of fever / pain Shock resolved.  PLAN -  KVO IVF. Diurese  Renal:   Lab 11/19/11 0400 11/18/11 0515 11/17/11 0345 11/15/11 0440 11/14/11 0500  NA 146* 143 146* -- --  CL 110 108 112 -- --  K 3.2* 3.5 3.5 -- --  CO2 28 28 27  -- --  BUN 27* 28* 27* -- --  CREATININE 0.79 0.92 0.79 -- --  GLUCOSE 129* 149* 151* -- --  MG 1.6 -- -- 2.1 1.8   Hypernatremia with likely free water loss Resolved renal failure Hypokalemia from diuresis Plan Free water 200 Q8 Lasix Q8   GI: S/p exp lap - small bowel resection with fecal contamination  Rectal ca now w/ mets to pelvis and lung PLAN -  Surgery following Cont VAC Enteral tube feeds NG tube  CT abd with continued fever, distention  HEME:  Lab 11/19/11 0400 11/18/11 0515 11/17/11 0345  HGB 6.7* 7.5* 7.3*  HCT 20.6* 23.6* 23.1*  PLT 409* 313 206  anemia of critical illness Plan: Trigger <7 (transfuse 1U PRBC today) Trend CBC PPI  Endocrine CBG (last 3)   Basename 11/19/11 1610 11/19/11 0358 11/18/11  2339  GLUCAP 123* 117* 135*  Hyperglycemia Plan: ssi  Infectious disease:  Peritonitis Aspiration PNA --  Pct improved  Buttock abscess Still febrile, WBC up  PLAN -  Cont broad spectrum abx and micafungin Follow wound culture of buttock wound CT abdomen today D/C vanc, no positive cultures  Neuro: Sedated on vent P: Cont current sedation protocol SBT/WUA   Goals of care: Lengthy discussion with family was held on 10/2 with PCCM.  His code status is no CPR, no pressors.  We will try to get him off the vent by next  Tuesday.  If he is not extubated by then we will palliatively extubate.  In the event that he deteriorates (new septic shock) then we will notify his family and will not resuscitate.    CC time 40 minutes  Yolonda Kida PCCM Pager: [phone] Cell: [phone] If no response, call [phone]

## 2011-11-19 NOTE — Progress Notes (Addendum)
Call MD on call regarding results of CT Scan.   Dr. Michaell Cowing will set up drainage procedure for Saturday 10/5.

## 2011-11-19 NOTE — Progress Notes (Addendum)
Palliative Medicine Team SW PMT follow up. Pt off sedation, alert, vented, no distress other than wanting something to drink (per dtr). Pt visited by dtr Cherie who reports good coping despite some past family dynamics. Met with pt's s/o in the hall who reports good spirits, because "he is doing so much better". S/o thanked me for use of cd player which she reports pt has really enjoyed. Informed pt and family that PMT shall continue to shadow to see how pt progresses, available as needed.   Jeffery [name], [facility] Team Phone [phone]

## 2011-11-20 ENCOUNTER — Encounter (HOSPITAL_COMMUNITY): Payer: Self-pay | Admitting: Radiology

## 2011-11-20 ENCOUNTER — Inpatient Hospital Stay (HOSPITAL_COMMUNITY): Payer: Medicaid Other

## 2011-11-20 ENCOUNTER — Other Ambulatory Visit (HOSPITAL_COMMUNITY): Payer: Medicaid Other

## 2011-11-20 LAB — BASIC METABOLIC PANEL
CO2: 27 mEq/L (ref 19–32)
Calcium: 7.7 mg/dL — ABNORMAL LOW (ref 8.4–10.5)
Creatinine, Ser: 0.68 mg/dL (ref 0.50–1.35)
GFR calc non Af Amer: >90 mL/min (ref >90)
Sodium: 143 mEq/L (ref 135–145)

## 2011-11-20 LAB — GLUCOSE, CAPILLARY
Glucose-Capillary: 107 mg/dL — ABNORMAL HIGH (ref 70–99)
Glucose-Capillary: 118 mg/dL — ABNORMAL HIGH (ref 70–99)
Glucose-Capillary: 136 mg/dL — ABNORMAL HIGH (ref 70–99)
Glucose-Capillary: 102 mg/dL — ABNORMAL HIGH (ref 70–99)
Glucose-Capillary: 114 mg/dL — ABNORMAL HIGH (ref 70–99)

## 2011-11-20 LAB — CBC WITH DIFFERENTIAL/PLATELET
Basophils Absolute: 0 10*3/uL (ref 0.0–0.1)
Eosinophils Relative: 0 % (ref 0–5)
MCH: 28.8 pg (ref 26.0–34.0)
Monocytes Absolute: 1.4 10*3/uL — ABNORMAL HIGH (ref 0.1–1.0)
Neutrophils Relative %: 86 % — ABNORMAL HIGH (ref 43–77)
Platelets: 516 10*3/uL — ABNORMAL HIGH (ref 150–400)
RBC: 2.64 MIL/uL — ABNORMAL LOW (ref 4.22–5.81)
WBC: 17.6 10*3/uL — ABNORMAL HIGH (ref 4.0–10.5)

## 2011-11-20 LAB — BLOOD GAS, ARTERIAL
Drawn by: 244901
MECHVT: 550 mL
PEEP: 5 cmH2O
Patient temperature: 100.1
RATE: 16 resp/min
pCO2 arterial: 34.5 mmHg — ABNORMAL LOW (ref 35.0–45.0)
pH, Arterial: 7.485 — ABNORMAL HIGH (ref 7.350–7.450)

## 2011-11-20 LAB — PROTIME-INR: Prothrombin Time: 16.5 seconds — ABNORMAL HIGH (ref 11.6–15.2)

## 2011-11-20 LAB — WOUND CULTURE: Special Requests: NORMAL

## 2011-11-20 MED ORDER — FENTANYL CITRATE 0.05 MG/ML IJ SOLN
50.0000 ug | INTRAMUSCULAR | Status: DC | PRN
  Administered 2011-11-20 – 2011-11-21 (×9): 100 ug via INTRAVENOUS
  Administered 2011-11-21: 50 ug via INTRAVENOUS
  Administered 2011-11-21 – 2011-11-23 (×13): 100 ug via INTRAVENOUS
  Administered 2011-11-23: 50 ug via INTRAVENOUS
  Administered 2011-11-23 – 2011-11-30 (×73): 100 ug via INTRAVENOUS
  Filled 2011-11-20 (×104): qty 2

## 2011-11-20 MED ORDER — MIDAZOLAM HCL 5 MG/ML IJ SOLN
2.0000 mg | INTRAMUSCULAR | Status: DC | PRN
  Administered 2011-11-20: 2 mg via INTRAVENOUS
  Administered 2011-11-21 (×2): 4 mg via INTRAVENOUS
  Filled 2011-11-20 (×3): qty 1

## 2011-11-20 MED ORDER — MIDAZOLAM HCL 2 MG/2ML IJ SOLN: 2.0000 mg | INTRAMUSCULAR | Status: DC | PRN

## 2011-11-20 NOTE — H&P (Signed)
Jeffery [name] is an 51 y.o. male.   Chief Complaint: rectal cancer Small bowel obstruction surgery 10 days ago New abscess per CT Scheduled for abd- pelvic abscess drain placement in am HPI: rectal ca; HTN; lung mets; smoker  Past Medical History  Diagnosis Date  . Hypertension     no meds  . Cancer     rectal   . 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 Multiple tooth extractions     9/11  . Portacath placement 04/12/2011    Procedure: INSERTION PORT-A-CATH;  Surgeon: Almond Lint, MD;  Location: [facility];  Service: General;  Laterality: N/A;  port a cath insertion  . Colon surgery 12/11    sbo  . Colon surgery 8/12    colon resection-iliostomy  . Colon surgery 2012    iliostomy takedown  . Colostomy 10/05/2011    Procedure: COLOSTOMY;  Surgeon: Almond Lint, MD;  Location: [facility];  Service: General;  Laterality: N/A;  open colostomy  . Examination under anesthesia 10/05/2011    Procedure: EXAM UNDER ANESTHESIA;  Surgeon: Almond Lint, MD;  Location: [facility];  Service: General;  Laterality: N/A;  exam under anesthesia  . Proctoscopy 10/05/2011    Procedure: PROCTOSCOPY;  Surgeon: Almond Lint, MD;  Location: [facility];  Service: General;  Laterality: N/A;  rigid proctoscopy  . Laparoscopy 10/05/2011    Procedure: LAPAROSCOPY DIAGNOSTIC;  Surgeon: Almond Lint, MD;  Location: [facility];  Service: General;  Laterality: N/A;  . Laparotomy 11/10/2011    Procedure: EXPLORATORY LAPAROTOMY;  Surgeon: Almond Lint, MD;  Location: [facility];  Service: General;  Laterality: N/A;  SMALL BOWEL RESECTION x 2  . Application of wound vac 11/10/2011    Procedure: APPLICATION OF WOUND VAC;  Surgeon: Almond Lint, MD;  Location: [facility];  Service: General;;    Family History  Problem Relation Age of Onset  . Cancer Mother     lung     Social History:  reports that he has been smoking Cigarettes.  He has a 3 pack-year smoking history. He has never used smokeless tobacco. He reports that he drinks alcohol. He reports that he does not use illicit drugs.  Allergies: No Known Allergies  Medications Prior to Admission  Medication Sig Dispense Refill  . amLODipine (NORVASC) 5 MG tablet Take 2 tablets (10 mg total) by mouth daily.  30 tablet  3  . lidocaine-prilocaine (EMLA) cream Apply topically as needed. Apply to [facility] site 1-2 hours prior to stick and cover with plastic wrap  30 g  prn  . oxyCODONE-acetaminophen (PERCOCET) 10-325 MG per tablet Take 1-2 tablets by mouth every 4 (four) hours as needed for pain.  70 tablet  0  . PRESCRIPTION MEDICATION Pt's a chemo patient and gets chemo at [facility]. Last treatment was 2 months ago. Pt states his next scheduled chemo is in September 2013. Followed by Dr Truett Perna        Results for orders placed during the hospital encounter of 11/07/11 (from the past 48 hour(s))  VANCOMYCIN, TROUGH     Status: Normal   Collection Time   11/18/11  1:30 PM      Component Value Range Comment   Vancomycin Tr 18.8  10.0 - 20.0 ug/mL   GLUCOSE, CAPILLARY     Status:  Abnormal   Collection Time   11/18/11  3:39 PM      Component Value Range Comment   Glucose-Capillary 136 (*) 70 - 99 mg/dL   GLUCOSE, CAPILLARY     Status: Abnormal   Collection Time   11/18/11  8:02 PM      Component Value Range Comment   Glucose-Capillary 121 (*) 70 - 99 mg/dL    Comment 1 Notify RN     GLUCOSE, CAPILLARY     Status: Abnormal   Collection Time   11/18/11 11:39 PM      Component Value Range Comment   Glucose-Capillary 135 (*) 70 - 99 mg/dL    Comment 1 Notify RN     GLUCOSE, CAPILLARY     Status: Abnormal   Collection Time   11/19/11  3:58 AM      Component Value Range Comment   Glucose-Capillary 117 (*) 70 - 99 mg/dL   BASIC METABOLIC PANEL     Status: Abnormal   Collection Time   11/19/11  4:00 AM       Component Value Range Comment   Sodium 146 (*) 135 - 145 mEq/L    Potassium 3.2 (*) 3.5 - 5.1 mEq/L    Chloride 110  96 - 112 mEq/L    CO2 28  19 - 32 mEq/L    Glucose, Bld 129 (*) 70 - 99 mg/dL    BUN 27 (*) 6 - 23 mg/dL    Creatinine, Ser 4.78  0.50 - 1.35 mg/dL    Calcium 7.6 (*) 8.4 - 10.5 mg/dL    GFR calc non Af Amer >90  >90 mL/min    GFR calc Af Amer >90  >90 mL/min   MAGNESIUM     Status: Normal   Collection Time   11/19/11  4:00 AM      Component Value Range Comment   Magnesium 1.6  1.5 - 2.5 mg/dL   CBC     Status: Abnormal   Collection Time   11/19/11  4:00 AM      Component Value Range Comment   WBC 19.8 (*) 4.0 - 10.5 K/uL    RBC 2.30 (*) 4.22 - 5.81 MIL/uL    Hemoglobin 6.7 (*) 13.0 - 17.0 g/dL    HCT 29.5 (*) 62.1 - 52.0 %    MCV 89.6  78.0 - 100.0 fL    MCH 29.1  26.0 - 34.0 pg    MCHC 32.5  30.0 - 36.0 g/dL    RDW 30.8 (*) 65.7 - 15.5 %    Platelets 409 (*) 150 - 400 K/uL   PREPARE RBC (CROSSMATCH)     Status: Normal   Collection Time   11/19/11  6:20 AM      Component Value Range Comment   Order Confirmation ORDER PROCESSED BY BLOOD BANK     TYPE AND SCREEN     Status: Normal (Preliminary result)   Collection Time   11/19/11  6:20 AM      Component Value Range Comment   ABO/RH(D) B POS      Antibody Screen NEG      Sample Expiration 11/22/2011      Unit Number [id number]      Blood Component Type RED CELLS,LR      Unit division 00      Status of Unit ISSUED      Transfusion Status OK TO TRANSFUSE      Crossmatch Result Compatible  GLUCOSE, CAPILLARY     Status: Abnormal   Collection Time   11/19/11  8:11 AM      Component Value Range Comment   Glucose-Capillary 123 (*) 70 - 99 mg/dL   GLUCOSE, CAPILLARY     Status: Abnormal   Collection Time   11/19/11 11:45 AM      Component Value Range Comment   Glucose-Capillary 110 (*) 70 - 99 mg/dL    Comment 1 Documented in Chart      Comment 2 Notify RN     GLUCOSE, CAPILLARY     Status: Abnormal    Collection Time   11/19/11  3:52 PM      Component Value Range Comment   Glucose-Capillary 124 (*) 70 - 99 mg/dL   GLUCOSE, CAPILLARY     Status: Abnormal   Collection Time   11/19/11  7:38 PM      Component Value Range Comment   Glucose-Capillary 111 (*) 70 - 99 mg/dL   GLUCOSE, CAPILLARY     Status: Abnormal   Collection Time   11/20/11 12:09 AM      Component Value Range Comment   Glucose-Capillary 112 (*) 70 - 99 mg/dL    Comment 1 Documented in Chart      Comment 2 Notify RN     BLOOD GAS, ARTERIAL     Status: Abnormal   Collection Time   11/20/11  4:01 AM      Component Value Range Comment   FIO2 0.30      Delivery systems VENTILATOR      Mode PRESSURE REGULATED VOLUME CONTROL      VT 550      Rate 16      Peep/cpap 5.0      pH, Arterial 7.485 (*) 7.350 - 7.450    pCO2 arterial 34.5 (*) 35.0 - 45.0 mmHg    pO2, Arterial 101.0 (*) 80.0 - 100.0 mmHg    Bicarbonate 25.5 (*) 20.0 - 24.0 mEq/L    TCO2 24.1  0 - 100 mmol/L    Acid-Base Excess 2.6 (*) 0.0 - 2.0 mmol/L    O2 Saturation 97.8      Patient temperature 100.1      Collection site LEFT RADIAL      Drawn by [phone]      Sample type ARTERIAL      Allens test (pass/fail) PASS  PASS   GLUCOSE, CAPILLARY     Status: Abnormal   Collection Time   11/20/11  4:08 AM      Component Value Range Comment   Glucose-Capillary 107 (*) 70 - 99 mg/dL    Comment 1 Documented in Chart      Comment 2 Notify RN     BASIC METABOLIC PANEL     Status: Abnormal   Collection Time   11/20/11  4:25 AM      Component Value Range Comment   Sodium 143  135 - 145 mEq/L    Potassium 3.9  3.5 - 5.1 mEq/L    Chloride 110  96 - 112 mEq/L    CO2 27  19 - 32 mEq/L    Glucose, Bld 115 (*) 70 - 99 mg/dL    BUN 23  6 - 23 mg/dL    Creatinine, Ser 0.45  0.50 - 1.35 mg/dL    Calcium 7.7 (*) 8.4 - 10.5 mg/dL    GFR calc non Af Amer >90  >90 mL/min    GFR calc Af Amer >  90  >90 mL/min   CBC WITH DIFFERENTIAL     Status: Abnormal   Collection Time    11/20/11  4:25 AM      Component Value Range Comment   WBC 17.6 (*) 4.0 - 10.5 K/uL    RBC 2.64 (*) 4.22 - 5.81 MIL/uL    Hemoglobin 7.6 (*) 13.0 - 17.0 g/dL    HCT 45.4 (*) 09.8 - 52.0 %    MCV 87.1  78.0 - 100.0 fL    MCH 28.8  26.0 - 34.0 pg    MCHC 33.0  30.0 - 36.0 g/dL    RDW 11.9 (*) 14.7 - 15.5 %    Platelets 516 (*) 150 - 400 K/uL    Neutrophils Relative 86 (*) 43 - 77 %    Lymphocytes Relative 6 (*) 12 - 46 %    Monocytes Relative 8  3 - 12 %    Eosinophils Relative 0  0 - 5 %    Basophils Relative 0  0 - 1 %    Neutro Abs 15.1 (*) 1.7 - 7.7 K/uL    Lymphs Abs 1.1  0.7 - 4.0 K/uL    Monocytes Absolute 1.4 (*) 0.1 - 1.0 K/uL    Eosinophils Absolute 0.0  0.0 - 0.7 K/uL    Basophils Absolute 0.0  0.0 - 0.1 K/uL    Smear Review MORPHOLOGY UNREMARKABLE     PROTIME-INR     Status: Abnormal   Collection Time   11/20/11  4:25 AM      Component Value Range Comment   Prothrombin Time 16.5 (*) 11.6 - 15.2 seconds    INR 1.37  0.00 - 1.49   APTT     Status: Normal   Collection Time   11/20/11  4:25 AM      Component Value Range Comment   aPTT 37  24 - 37 seconds    Ct Abdomen Pelvis Wo Contrast  11/20/2011  *RADIOLOGY REPORT*  Clinical Data: Small bowel obstruction, intra abdominal fluid collections  CT ABDOMEN AND PELVIS WITHOUT CONTRAST  Technique:  Multidetector CT imaging of the abdomen and pelvis was performed following the standard protocol without intravenous contrast.  Comparison:   the previous day's study  Findings: Multiple pulmonary nodules in the visualized lung bases. Nasogastric tube extends into decompressed stomach.  Persistent left upper quadrant peritoneal fluid collection in measuring at least 10 x 15.5 cm, displacing the spleen medially.  Persistent large collection just cephalad to the urinary bladder measuring up to 20 cm   diameter.  Stable right transgluteal drainage catheter which was probably withdrawn partially since its original placement.  Unremarkable  uninfused evaluation of liver, spleen, adrenal glands, pancreas.  There is persistent residual contrast in the decompressed renal collecting systems.  Patchy areas of persistent nephrogram in the left kidney which can be seen in the setting of pyelonephritis.  Atheromatous abdominal aorta.  There are dilated loops of bowel in the mid abdomen measuring up to 4.7 cm diameter at the level of anastomotic staple line.  There is residual oral contrast material in the decompressed colon.  There is a left lower quadrant diverting colostomy.  IMPRESSION:  1.  Some interval increase in degree of distention of mid abdominal small bowel loops.  Limited evaluation secondary to lack of oral contrast.  Unenhanced CT was performed per clinician order.  Lack of IV contrast limits sensitivity and specificity, especially for evaluation of abdominal/pelvic solid viscera. 2.  Little change in  large peritoneal fluid collections previously described.   Original Report Authenticated By: Osa Craver, M.D.    Ct Abdomen Pelvis W Contrast  11/19/2011  *RADIOLOGY REPORT*  Clinical Data: Postoperative fever and leukocytosis. 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  CT ABDOMEN AND PELVIS WITH CONTRAST  Technique:  Multidetector CT imaging of the abdomen and pelvis was performed following the standard protocol during bolus administration of intravenous contrast.  Contrast: OMNIPAQUE IOHEXOL 300 MG/ML  SOLN  Comparison: 10/12/2011.  Findings: Increased size and number of multiple masses in both lungs.  The largest is in the right lower lobe, measuring 1.8 cm in maximum diameter on image #1.  The previously demonstrated transgluteal drainage catheter in the presacral space is more laterally positioned on the right at this time.  There is some fluid in the prerectal space, measuring 6.7 x 2.9 cm on image number 81.  Interval development of a large collection of fluid in the left upper abdomen, beneath the left  hemidiaphragm, displacing the spleen and stomach to the right.  On image number 18, this measures 15.7 x 10.7 cm in maximum dimensions.  On coronal image number 57, this measures 10.7 cm in length. This has a thin surrounding enhancing rim.  Interval development of a large collection of fluid in the pelvis and lower abdomen, measuring 22.4 x 13.9 cm in maximum dimensions on coronal image number 30 and 9.8 cm in AP diameter on axial image number 73.  On coronal image number 34, this measures 21.9 x 21.1 cm in maximum dimensions.  This also has a thin surrounding enhancing rim.  Nasogastric tube tip in the distal stomach.  No dilated bowel loops are seen at this time.  The liver, pancreas, adrenal glands, kidneys and urinary bladder are unremarkable.  A Foley catheter is in the bladder.  Normal sized prostate gland.  The gallbladder is poorly distended with diffuse wall enhancement and small amount of pericholecystic fluid.  There is also a small amount of free peritoneal fluid in addition to the fluid collections described above.  Mild left lower lobe atelectasis.  Unremarkable bones.  IMPRESSION:  1.  Two very large abscesses in the abdomen and pelvis, as described above. 2.  Interval displacement of the presacral drainage catheter with redevelopment of a small amount of fluid/abscess in the presacral space. 3.  Progressive pulmonary metastatic disease. 4.  Mild left lower lobe atelectasis. 5.  Small amount of free peritoneal fluid, including pericholecystic fluid.  These results will be called to the ordering clinician or representative by the Radiologist Assistant, and communication documented in the PACS Dashboard.   Original Report Authenticated By: Darrol Angel, M.D.    Dg Chest Port 1 View  11/20/2011  *RADIOLOGY REPORT*  Clinical Data: Check ET tube position.  Hypertension.  No new lung mets from rectal carcinoma.  PORTABLE CHEST - 1 VIEW  Comparison: 11/19/2011  Findings: Endotracheal tube tip lies 3.3  cm above the carina, without significant change, well positioned.  Nasogastric tube passes below the diaphragm into the stomach. Right anterior chest wall power Port-A-Cath has its tips at the caval atrial junction.  Cardiac silhouette is normal in size and configuration.  No mediastinal or hilar mass or adenopathy.  At least three discrete right lung nodules are noted, stable.  Mild atelectasis at the medial left lung base.  Lungs are otherwise clear.  IMPRESSION: Lines and tubes are well positioned and stable.  No acute cardiopulmonary disease.   Original  Report Authenticated By: Domenic Moras, M.D.    Dg Chest Port 1 View  11/19/2011  *RADIOLOGY REPORT*  Clinical Data: ETT placement, mets  PORTABLE CHEST - 1 VIEW  Comparison: 10.  1013  Findings: Endotracheal tube terminates 3 cm above the carina.  Low lung volumes with increased interstitial markings.  Right lower lobe nodule.  Additional smaller bilateral nodular opacities/metastases, better visualized on CT. The effusion  Cardiomediastinal silhouette is within normal limits.  Stable right chest power port and right arm PICC.  IMPRESSION: Endotracheal tube terminates 3 cm above the carina.  Otherwise unchanged.   Original Report Authenticated By: Charline Bills, M.D.    Dg Abd Portable 1v  11/20/2011  *RADIOLOGY REPORT*  Clinical Data: Abdominal distention  PORTABLE ABDOMEN - 1 VIEW  Comparison:   the previous day's study  Findings: Nasogastric tube has been advanced into the decompressed stomach.  There are a few dilated small bowel loops in the mid abdomen.  The colon is decompressed.  Pigtail drainage catheter projects in the right pelvis.  Ostomy device is noted in the left lower abdomen.  Pulmonary nodules are noted in the visualized lung bases.  IMPRESSION:  1.  Dilated small bowel loops suggesting mid small bowel obstruction.   Original Report Authenticated By: Osa Craver, M.D.     Review of Systems  Constitutional: Positive for  weight loss. Negative for fever.  Respiratory: Positive for shortness of breath.   Cardiovascular: Negative for chest pain.  Gastrointestinal: Positive for abdominal pain.  Musculoskeletal: Positive for back pain.  Neurological: Positive for weakness.    Blood pressure 126/88, pulse 121, temperature 98.1 F (36.7 C), temperature source Oral, resp. rate 19, height 5\' 8"  (1.727 m), weight 125 lb 3.5 oz (56.8 kg), SpO2 100.00%. Physical Exam  Constitutional: He is oriented to person, place, and time.  Cardiovascular: Normal rate, regular rhythm and normal heart sounds.   No murmur heard. Respiratory: Effort normal and breath sounds normal. He has no wheezes.  GI: Soft. Bowel sounds are normal. There is tenderness.  Neurological: He is alert and oriented to person, place, and time.       Pt on vent but able to answer all questions with nod of head; appropriate  Psychiatric: He has a normal mood and affect. His behavior is normal. Judgment and thought content normal.     Assessment/Plan Hx rectal ca SBO - resection 11/10/11 CT shows new abscess in pelvis Pt on vent but alert Scheduled for abd/pelvic abscess drain placement Aware of procedure benefits and risks and agreeable to proceed. Consent signed and in chart  Jazmynn Pho A 11/20/2011, 12:03 PM

## 2011-11-20 NOTE — Progress Notes (Signed)
Notified Dr. Frederico Hamman, Elink of no colostomy output.

## 2011-11-20 NOTE — Significant Event (Signed)
Pt had large emesis from his mouth at about 2230 11/19/11, emesis had the color of tube feed. Tube feed turned off, oral suction/care performed. NG tube placement confirmed by auscultation. Patient in no respirtory distress. MD notified, new orders received. Will continue to monitor.

## 2011-11-20 NOTE — Progress Notes (Signed)
Jeffery [name] [id number] May 18, 1960   Subjective:  RN notes pt vomited I flushed clogged NGT Pt denies much pain Wants to be extubated   Objective:  Vital signs:  Filed Vitals:   11/20/11 0357 11/20/11 0400 11/20/11 0500 11/20/11 0600  BP:  110/76 115/85 122/84  Pulse: 102 102 109 110  Temp:  98.3 F (36.8 C)    TempSrc:  Oral    Resp: 16 16 16 17   Height:      Weight:   125 lb 3.5 oz (56.8 kg)   SpO2: 100% 100% 100% 100%    Last BM Date:  (has a colostomy)  Intake/Output   Yesterday:  10/04 0701 - 10/05 0700 In: 2337.5 [I.V.:710; Blood:262.5; NG/GT:275; IV Piggyback:610] Out: 3680 [Urine:2610; Drains:10; Stool:1060] This shift:     Bowel function:  Flatus: n  BM: n  Physical Exam:  General: Pt awake/alert/oriented x4 in no acute distress Eyes: PERRL, normal EOM.  Sclera clear.  No icterus Neuro: CN II-XII intact w/o focal sensory/motor deficits. Lymph: No head/neck/groin lymphadenopathy Psych:  No delerium/psychosis/paranoia HENT: Normocephalic, Mucus membranes moist.  No thrush Neck: Supple, No tracheal deviation Chest: No chest wall pain w good excursion CV:  Pulses intact.  Regular rhythm Abdomen: Soft.  Nondistended.  Mildly tender at incisions only.  No incarcerated hernias.  Wound vac in place.  Ostomy no gas/stool Ext:  SCDs BLE.  No mjr edema.  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 male  10 Days Post-Op   Procedure(s): LAPAROSCOPIC LYSIS OF ADHESIONS EXPLORATORY LAPAROTOMY APPLICATION OF WOUND VAC  Postop ileus  Intraabd collections, probable abscesses  Plan:  -CT guided drainage of fluid collection -NGT until ileus resolves -wean vent as tolerated -wound vac -IV ABx -VTE prophylaxis- SCDs, etc -mobilize as tolerated to help recovery  Ardeth Sportsman, M.D., F.A.C.S. Gastrointestinal and Minimally Invasive Surgery [facility] [address], [address] [place], [place] [address] [phone] Main / Paging [phone] Voice Mail   11/20/2011  CARE TEAM:  PCP: No primary provider on file.  Outpatient Care Team: Patient Care Team: Almond Lint, MD as Consulting Physician (General Surgery) Ladene Artist, MD as Consulting Physician (Medical Oncology)  Inpatient Treatment Team: Treatment Team: Attending Provider: Almond Lint, MD; Registered Nurse: Domingo Mend Tai, RN; Technician: Calton Golds, NT; Registered Nurse: Tristan Schroeder, RN; Rounding Team: Md Pccm, MD; Registered Nurse: Jairo Ben, Student-RN; Attending Physician: Ladene Artist, MD; Respiratory Therapist: Renold Genta, RRT; Respiratory Therapist: Liz Beach, RRT; Consulting Physician: Palliative [facility]; Technician: Ivor Reining, NT; Consulting Physician: Ardeth Sportsman, MD; Registered Nurse: Oda Kilts, RN; Technician: Roxy Manns, NT; Registered Nurse: Lilia Pro Mbemena, RN   Results:   Labs: Results for orders placed during the hospital encounter of 11/07/11 (from the past 48 hour(s))  GLUCOSE, CAPILLARY     Status: Abnormal   Collection Time   11/18/11  7:58 AM      Component Value  Range Comment   Glucose-Capillary 119 (*) 70 - 99 mg/dL   GLUCOSE, CAPILLARY     Status: Abnormal   Collection Time   11/18/11 12:00 PM      Component Value Range Comment   Glucose-Capillary 124 (*) 70 - 99 mg/dL    Comment 1 Documented in  Chart      Comment 2 Notify RN     VANCOMYCIN, [place]     Status: Normal   Collection Time   11/18/11  1:30 PM      Component Value Range Comment   Vancomycin Tr 18.8  10.0 - 20.0 ug/mL   GLUCOSE, CAPILLARY     Status: Abnormal   Collection Time   11/18/11  3:39 PM      Component Value Range Comment   Glucose-Capillary 136 (*) 70 - 99 mg/dL   GLUCOSE, CAPILLARY     Status: Abnormal   Collection Time   11/18/11  8:02 PM      Component Value Range Comment   Glucose-Capillary 121 (*) 70 - 99 mg/dL    Comment 1 Notify RN     GLUCOSE, CAPILLARY     Status: Abnormal   Collection Time   11/18/11 11:39 PM      Component Value Range Comment   Glucose-Capillary 135 (*) 70 - 99 mg/dL    Comment 1 Notify RN     GLUCOSE, CAPILLARY     Status: Abnormal   Collection Time   11/19/11  3:58 AM      Component Value Range Comment   Glucose-Capillary 117 (*) 70 - 99 mg/dL   BASIC METABOLIC PANEL     Status: Abnormal   Collection Time   11/19/11  4:00 AM      Component Value Range Comment   Sodium 146 (*) 135 - 145 mEq/L    Potassium 3.2 (*) 3.5 - 5.1 mEq/L    Chloride 110  96 - 112 mEq/L    CO2 28  19 - 32 mEq/L    Glucose, Bld 129 (*) 70 - 99 mg/dL    BUN 27 (*) 6 - 23 mg/dL    Creatinine, Ser 1.61  0.50 - 1.35 mg/dL    Calcium 7.6 (*) 8.4 - 10.5 mg/dL    GFR calc non Af Amer >90  >90 mL/min    GFR calc Af Amer >90  >90 mL/min   MAGNESIUM     Status: Normal   Collection Time   11/19/11  4:00 AM      Component Value Range Comment   Magnesium 1.6  1.5 - 2.5 mg/dL   CBC     Status: Abnormal   Collection Time   11/19/11  4:00 AM      Component Value Range Comment   WBC 19.8 (*) 4.0 - 10.5 K/uL    RBC 2.30 (*) 4.22 - 5.81 MIL/uL    Hemoglobin 6.7 (*) 13.0 - 17.0 g/dL    HCT 09.6 (*) 04.5 - 52.0 %    MCV 89.6  78.0 - 100.0 fL    MCH 29.1  26.0 - 34.0 pg    MCHC 32.5  30.0 - 36.0 g/dL    RDW 40.9 (*) 81.1 - 15.5 %    Platelets 409 (*) 150 - 400 K/uL   PREPARE RBC (CROSSMATCH)     Status:  Normal   Collection Time   11/19/11  6:20 AM      Component Value Range Comment   Order Confirmation ORDER  PROCESSED BY BLOOD BANK     TYPE AND SCREEN     Status: Normal (Preliminary result)   Collection Time   11/19/11  6:20 AM      Component Value Range Comment   ABO/RH(D) B POS      Antibody Screen NEG      Sample Expiration 11/22/2011      Unit Number [id number]      Blood Component Type RED CELLS,LR      Unit division 00      Status of Unit ISSUED      Transfusion Status OK TO TRANSFUSE      Crossmatch Result Compatible     GLUCOSE, CAPILLARY     Status: Abnormal   Collection Time   11/19/11  8:11 AM      Component Value Range Comment   Glucose-Capillary 123 (*) 70 - 99 mg/dL   GLUCOSE, CAPILLARY     Status: Abnormal   Collection Time   11/19/11 11:45 AM      Component Value Range Comment   Glucose-Capillary 110 (*) 70 - 99 mg/dL    Comment 1 Documented in Chart      Comment 2 Notify RN     GLUCOSE, CAPILLARY     Status: Abnormal   Collection Time   11/19/11  3:52 PM      Component Value Range Comment   Glucose-Capillary 124 (*) 70 - 99 mg/dL   GLUCOSE, CAPILLARY     Status: Abnormal   Collection Time   11/19/11  7:38 PM      Component Value Range Comment   Glucose-Capillary 111 (*) 70 - 99 mg/dL   GLUCOSE, CAPILLARY     Status: Abnormal   Collection Time   11/20/11 12:09 AM      Component Value Range Comment   Glucose-Capillary 112 (*) 70 - 99 mg/dL    Comment 1 Documented in Chart      Comment 2 Notify RN     BLOOD GAS, ARTERIAL     Status: Abnormal   Collection Time   11/20/11  4:01 AM      Component Value Range Comment   FIO2 0.30      Delivery systems VENTILATOR      Mode PRESSURE REGULATED VOLUME CONTROL      VT 550      Rate 16      Peep/cpap 5.0      pH, Arterial 7.485 (*) 7.350 - 7.450    pCO2 arterial 34.5 (*) 35.0 - 45.0 mmHg    pO2, Arterial 101.0 (*) 80.0 - 100.0 mmHg    Bicarbonate 25.5 (*) 20.0 - 24.0 mEq/L    TCO2 24.1  0 - 100 mmol/L     Acid-Base Excess 2.6 (*) 0.0 - 2.0 mmol/L    O2 Saturation 97.8      Patient temperature 100.1      Collection site LEFT RADIAL      Drawn by [phone]      Sample type ARTERIAL      Allens test (pass/fail) PASS  PASS   GLUCOSE, CAPILLARY     Status: Abnormal   Collection Time   11/20/11  4:08 AM      Component Value Range Comment   Glucose-Capillary 107 (*) 70 - 99 mg/dL    Comment 1 Documented in Chart      Comment 2 Notify RN     BASIC METABOLIC PANEL     Status: Abnormal   Collection  Time   11/20/11  4:25 AM      Component Value Range Comment   Sodium 143  135 - 145 mEq/L    Potassium 3.9  3.5 - 5.1 mEq/L    Chloride 110  96 - 112 mEq/L    CO2 27  19 - 32 mEq/L    Glucose, Bld 115 (*) 70 - 99 mg/dL    BUN 23  6 - 23 mg/dL    Creatinine, Ser 1.61  0.50 - 1.35 mg/dL    Calcium 7.7 (*) 8.4 - 10.5 mg/dL    GFR calc non Af Amer >90  >90 mL/min    GFR calc Af Amer >90  >90 mL/min   CBC WITH DIFFERENTIAL     Status: Abnormal   Collection Time   11/20/11  4:25 AM      Component Value Range Comment   WBC 17.6 (*) 4.0 - 10.5 K/uL    RBC 2.64 (*) 4.22 - 5.81 MIL/uL    Hemoglobin 7.6 (*) 13.0 - 17.0 g/dL    HCT 09.6 (*) 04.5 - 52.0 %    MCV 87.1  78.0 - 100.0 fL    MCH 28.8  26.0 - 34.0 pg    MCHC 33.0  30.0 - 36.0 g/dL    RDW 40.9 (*) 81.1 - 15.5 %    Platelets 516 (*) 150 - 400 K/uL    Neutrophils Relative 86 (*) 43 - 77 %    Lymphocytes Relative 6 (*) 12 - 46 %    Monocytes Relative 8  3 - 12 %    Eosinophils Relative 0  0 - 5 %    Basophils Relative 0  0 - 1 %    Neutro Abs 15.1 (*) 1.7 - 7.7 K/uL    Lymphs Abs 1.1  0.7 - 4.0 K/uL    Monocytes Absolute 1.4 (*) 0.1 - 1.0 K/uL    Eosinophils Absolute 0.0  0.0 - 0.7 K/uL    Basophils Absolute 0.0  0.0 - 0.1 K/uL    Smear Review MORPHOLOGY UNREMARKABLE     PROTIME-INR     Status: Abnormal   Collection Time   11/20/11  4:25 AM      Component Value Range Comment   Prothrombin Time 16.5 (*) 11.6 - 15.2 seconds    INR 1.37  0.00  - 1.49   APTT     Status: Normal   Collection Time   11/20/11  4:25 AM      Component Value Range Comment   aPTT 37  24 - 37 seconds     Imaging / Studies: Ct Abdomen Pelvis Wo Contrast  11/20/2011  *RADIOLOGY REPORT*  Clinical Data: Small bowel obstruction, intra abdominal fluid collections  CT ABDOMEN AND PELVIS WITHOUT CONTRAST  Technique:  Multidetector CT imaging of the abdomen and pelvis was performed following the standard protocol without intravenous contrast.  Comparison:   the previous day's study  Findings: Multiple pulmonary nodules in the visualized lung bases. Nasogastric tube extends into decompressed stomach.  Persistent left upper quadrant peritoneal fluid collection in measuring at least 10 x 15.5 cm, displacing the spleen medially.  Persistent large collection just cephalad to the urinary bladder measuring up to 20 cm   diameter.  Stable right transgluteal drainage catheter which was probably withdrawn partially since its original placement.  Unremarkable uninfused evaluation of liver, spleen, adrenal glands, pancreas.  There is persistent residual contrast in the decompressed renal collecting systems.  Patchy areas of  persistent nephrogram in the left kidney which can be seen in the setting of pyelonephritis.  Atheromatous abdominal aorta.  There are dilated loops of bowel in the mid abdomen measuring up to 4.7 cm diameter at the level of anastomotic staple line.  There is residual oral contrast material in the decompressed colon.  There is a left lower quadrant diverting colostomy.  IMPRESSION:  1.  Some interval increase in degree of distention of mid abdominal small bowel loops.  Limited evaluation secondary to lack of oral contrast.  Unenhanced CT was performed per clinician order.  Lack of IV contrast limits sensitivity and specificity, especially for evaluation of abdominal/pelvic solid viscera. 2.  Little change in large peritoneal fluid collections previously described.    Original Report Authenticated By: Osa Craver, M.D.    Ct Abdomen Pelvis W Contrast  11/19/2011  *RADIOLOGY REPORT*  Clinical Data: Postoperative fever and leukocytosis. 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  CT ABDOMEN AND PELVIS WITH CONTRAST  Technique:  Multidetector CT imaging of the abdomen and pelvis was performed following the standard protocol during bolus administration of intravenous contrast.  Contrast: OMNIPAQUE IOHEXOL 300 MG/ML  SOLN  Comparison: 10/12/2011.  Findings: Increased size and number of multiple masses in both lungs.  The largest is in the right lower lobe, measuring 1.8 cm in maximum diameter on image #1.  The previously demonstrated transgluteal drainage catheter in the presacral space is more laterally positioned on the right at this time.  There is some fluid in the prerectal space, measuring 6.7 x 2.9 cm on image number 81.  Interval development of a large collection of fluid in the left upper abdomen, beneath the left hemidiaphragm, displacing the spleen and stomach to the right.  On image number 18, this measures 15.7 x 10.7 cm in maximum dimensions.  On coronal image number 57, this measures 10.7 cm in length. This has a thin surrounding enhancing rim.  Interval development of a large collection of fluid in the pelvis and lower abdomen, measuring 22.4 x 13.9 cm in maximum dimensions on coronal image number 30 and 9.8 cm in AP diameter on axial image number 73.  On coronal image number 34, this measures 21.9 x 21.1 cm in maximum dimensions.  This also has a thin surrounding enhancing rim.  Nasogastric tube tip in the distal stomach.  No dilated bowel loops are seen at this time.  The liver, pancreas, adrenal glands, kidneys and urinary bladder are unremarkable.  A Foley catheter is in the bladder.  Normal sized prostate gland.  The gallbladder is poorly distended with diffuse wall enhancement and small amount of pericholecystic fluid.  There is also a  small amount of free peritoneal fluid in addition to the fluid collections described above.  Mild left lower lobe atelectasis.  Unremarkable bones.  IMPRESSION:  1.  Two very large abscesses in the abdomen and pelvis, as described above. 2.  Interval displacement of the presacral drainage catheter with redevelopment of a small amount of fluid/abscess in the presacral space. 3.  Progressive pulmonary metastatic disease. 4.  Mild left lower lobe atelectasis. 5.  Small amount of free peritoneal fluid, including pericholecystic fluid.  These results will be called to the ordering clinician or representative by the Radiologist Assistant, and communication documented in the PACS Dashboard.   Original Report Authenticated By: Darrol Angel, M.D.    Dg Chest Port 1 View  11/19/2011  *RADIOLOGY REPORT*  Clinical Data: ETT placement, mets  PORTABLE CHEST - 1 VIEW  Comparison: 10.  1013  Findings: Endotracheal tube terminates 3 cm above the carina.  Low lung volumes with increased interstitial markings.  Right lower lobe nodule.  Additional smaller bilateral nodular opacities/metastases, better visualized on CT. The effusion  Cardiomediastinal silhouette is within normal limits.  Stable right chest power port and right arm PICC.  IMPRESSION: Endotracheal tube terminates 3 cm above the carina.  Otherwise unchanged.   Original Report Authenticated By: Charline Bills, M.D.    Dg Abd Portable 1v  11/20/2011  *RADIOLOGY REPORT*  Clinical Data: Abdominal distention  PORTABLE ABDOMEN - 1 VIEW  Comparison:   the previous day's study  Findings: Nasogastric tube has been advanced into the decompressed stomach.  There are a few dilated small bowel loops in the mid abdomen.  The colon is decompressed.  Pigtail drainage catheter projects in the right pelvis.  Ostomy device is noted in the left lower abdomen.  Pulmonary nodules are noted in the visualized lung bases.  IMPRESSION:  1.  Dilated small bowel loops suggesting mid small  bowel obstruction.   Original Report Authenticated By: Osa Craver, M.D.     Medications / Allergies: per chart  Antibiotics: Anti-infectives     Start     Dose/Rate Route Frequency Ordered Stop   11/17/11 1400   vancomycin (VANCOCIN) IVPB 1000 mg/200 mL premix  Status:  Discontinued        1,000 mg 200 mL/hr over 60 Minutes Intravenous Every 8 hours 11/17/11 0802 11/19/11 0934   11/15/11 1400  imipenem-cilastatin (PRIMAXIN) 500 mg in sodium chloride 0.9 % 100 mL IVPB       500 mg 200 mL/hr over 30 Minutes Intravenous 3 times per day 11/15/11 0931     11/15/11 0615   vancomycin (VANCOCIN) 750 mg in sodium chloride 0.9 % 150 mL IVPB  Status:  Discontinued        750 mg 150 mL/hr over 60 Minutes Intravenous 3 times per day 11/15/11 0608 11/17/11 0801   11/14/11 1800   imipenem-cilastatin (PRIMAXIN) 250 mg in sodium chloride 0.9 % 100 mL IVPB  Status:  Discontinued        250 mg 200 mL/hr over 30 Minutes Intravenous 4 times per day 11/14/11 1434 11/15/11 0931   11/13/11 1800   vancomycin (VANCOCIN) 750 mg in sodium chloride 0.9 % 150 mL IVPB  Status:  Discontinued        750 mg 150 mL/hr over 60 Minutes Intravenous Every 12 hours 11/13/11 0631 11/15/11 0608   11/13/11 0645   vancomycin (VANCOCIN) 750 mg in sodium chloride 0.9 % 150 mL IVPB        750 mg 150 mL/hr over 60 Minutes Intravenous  Once 11/13/11 0631 11/13/11 0738   11/12/11 1200   imipenem-cilastatin (PRIMAXIN) 500 mg in sodium chloride 0.9 % 100 mL IVPB  Status:  Discontinued        500 mg 200 mL/hr over 30 Minutes Intravenous 3 times per day 11/12/11 1015 11/14/11 1434   11/11/11 2000   micafungin (MYCAMINE) 100 mg in sodium chloride 0.9 % 100 mL IVPB     Comments: stat      100 mg 100 mL/hr over 1 Hours Intravenous Every 24 hours 11/10/11 1939     11/11/11 1800   vancomycin (VANCOCIN) 500 mg in sodium chloride 0.9 % 100 mL IVPB  Status:  Discontinued        500 mg 100 mL/hr over 60  Minutes Intravenous  Every 12 hours 11/11/11 1014 11/13/11 0631   11/10/11 2000   micafungin (MYCAMINE) 100 mg in sodium chloride 0.9 % 100 mL IVPB     Comments: stat      100 mg 100 mL/hr over 1 Hours Intravenous Daily 11/10/11 1939 11/10/11 2215   11/10/11 2000   imipenem-cilastatin (PRIMAXIN) 250 mg in sodium chloride 0.9 % 100 mL IVPB  Status:  Discontinued        250 mg 200 mL/hr over 30 Minutes Intravenous 4 times per day 11/10/11 1951 11/12/11 1014   11/10/11 1630   fluconazole (DIFLUCAN) IVPB 200 mg  Status:  Discontinued        200 mg 100 mL/hr over 60 Minutes Intravenous Every 24 hours 11/10/11 1617 11/10/11 1939   11/10/11 1630   vancomycin (VANCOCIN) 500 mg in sodium chloride 0.9 % 100 mL IVPB  Status:  Discontinued        500 mg 100 mL/hr over 60 Minutes Intravenous 3 times per day 11/10/11 1617 11/11/11 1014   11/10/11 1630   piperacillin-tazobactam (ZOSYN) IVPB 3.375 g  Status:  Discontinued        3.375 g 12.5 mL/hr over 240 Minutes Intravenous 3 times per day 11/10/11 1617 11/10/11 1941   11/10/11 0933   cefOXitin (MEFOXIN) 2 g in dextrose 5 % 50 mL IVPB        2 g 100 mL/hr over 30 Minutes Intravenous 60 min pre-op 11/10/11 0926 11/10/11 1105   11/10/11 0930   cefOXitin (MEFOXIN) 1 g in dextrose 5 % 50 mL IVPB  Status:  Discontinued        1 g 100 mL/hr over 30 Minutes Intravenous 3 times per day 11/10/11 0924 11/10/11 0926   11/07/11 2200   cefTRIAXone (ROCEPHIN) 1 g in dextrose 5 % 50 mL IVPB  Status:  Discontinued        1 g 100 mL/hr over 30 Minutes Intravenous Every 24 hours 11/07/11 2124 11/10/11 1536   11/07/11 1830   cefTRIAXone (ROCEPHIN) 1 g in dextrose 5 % 50 mL IVPB        1 g 100 mL/hr over 30 Minutes Intravenous  Once 11/07/11 1819 11/07/11 1920

## 2011-11-20 NOTE — Progress Notes (Signed)
Small bowel obstruction on KUB; repeat CT abdomen.

## 2011-11-20 NOTE — H&P (Signed)
Agree 

## 2011-11-20 NOTE — Progress Notes (Signed)
PULMONARY / CCM CONSULT NOTE  Requesting MD/Service: Byerly/CCS Date of admission: 9/22 Date of consult: 9/25 Reason for consultation: post op VDRF  Pt Profile:  51 yobm with history of recurrent metastatic rectal cancer underwent exploratory laparotomy, small bowel resection x2, incidental appendectomy 9/25. Was thought to have aspirated in PACU post operatively with feculent emesis reported. Required reintubation in PACU. PCCM asked to assist with vent/CCM issues.  Lines, Tubes, etc: ETT 9/25 >> 9/27>>>9/28>>> RUE PICC 9/24 >>  R [place] Portacath (longstanding)   Microbiology: Urine 9/22 >> Klebs (pansens) PCT 9/26: >200 10/1 blood >> 10/1 resp >> 10/1 urine >>neg 10/2 wound >> neg  Antibiotics:  Fluconazole 9/25 >> 9/25 Zosyn 9/25 >> 9/25 Vanc 9/25>>>10/4 Micafungin 9/26>>> Imipenem 9/25>>>  Studies/Events: 10/4 CT Abd >>   Consults:  CCS (primary)  Best Practice: DVT: scd SUP: PPI Nutrition: enteral feedings Glycemic control: SSI  Sedation/analgesia: continuous sedation  SUBJ: Failed SBT this AM, but mental status much improved this morning WBC up, Hgb down.    BP 127/83  Pulse 116  Temp 98.1 F (36.7 C) (Oral)  Resp 24  Ht 5\' 8"  (1.727 m)  Wt 56.8 kg (125 lb 3.5 oz)  BMI 19.04 kg/m2  SpO2 100%   EXAM:  Gen: interactive on vent HEENT: NCAT, PERRL PULM: few crackles in bases CV : Tachy, regular AB: BS+, wound vac Ext: warm, scd's Neuro: following commands, awake  IMPRESSION:   Pulm:  ABG    Component Value Date/Time   PHART 7.485* 11/20/2011 0401   PCO2ART 34.5* 11/20/2011 0401   PO2ART 101.0* 11/20/2011 0401   HCO3 25.5* 11/20/2011 0401   TCO2 24.1 11/20/2011 0401   ACIDBASEDEF 2.0 11/14/2011 1100   O2SAT 97.8 11/20/2011 0401   PCXR: bilateral airspace disease  Without change  Acute respiratory failure - post op abd surgery with likely aspiration, active smoker. Aspiration PNA resolved ARDS +/- element of edema, oxygenation improved  greatly Pulmonary metastasis  Reintubated on 9/28, tolerating vent well for now, continue full support .  Failed SBT 10/5.  PLAN -  Re-attempt SBT again in AM. Change sedation to prn. Daily SBT. If not off vent next week family wants palliative extubation. Continue lasix (bid). Cont BDs. See ID section.  Cardiology: Severe sepsis/occult shock -- resolved  Tachycardia - in setting of fever / pain Shock resolved.  PLAN -  KVO IVF. Diurese.  Renal:   Lab 11/20/11 0425 11/19/11 0400 11/18/11 0515 11/15/11 0440 11/14/11 0500  NA 143 146* 143 -- --  CL 110 110 108 -- --  K 3.9 3.2* 3.5 -- --  CO2 27 28 28  -- --  BUN 23 27* 28* -- --  CREATININE 0.68 0.79 0.92 -- --  GLUCOSE 115* 129* 149* -- --  MG -- 1.6 -- 2.1 1.8   Intake/Output Summary (Last 24 hours) at 11/20/11 1023 Last data filed at 11/20/11 4098  Gross per 24 hour  Intake   1810 ml  Output   2780 ml  Net   -970 ml   Hypernatremia with likely free water loss Resolved renal failure Hypokalemia from diuresis Plan Free water 200 Q8 Lasix Q8  GI: S/p exp lap - small bowel resection with fecal contamination  Rectal ca now w/ mets to pelvis and lung PLAN -  Surgery following Cont VAC Enteral tube feeds NG tube  CT abd with continued fever, distention  HEME:  Lab 11/20/11 0425 11/19/11 0400 11/18/11 0515  HGB 7.6* 6.7* 7.5*  HCT  23.0* 20.6* 23.6*  PLT 516* 409* 313  anemia of critical illness Plan: Trigger <7 (transfuse 1U PRBC today). Trend CBC. PPI.  Endocrine CBG (last 3)   Basename 11/20/11 0408 11/20/11 0009 11/19/11 1938  GLUCAP 107* 112* 111*  Hyperglycemia Plan: ssi  Infectious disease:  Peritonitis Aspiration PNA --  Pct improved  Buttock abscess Still febrile, WBC up.  PLAN -  Cont broad spectrum abx and micafungin Follow wound culture of buttock wound CT abdomen today D/C vanc, no positive cultures  Neuro: Sedated on vent P: Cont current sedation  protocol SBT/WUA   Goals of care: Lengthy discussion with family was held on 10/2 with PCCM.  His code status is no CPR, no pressors.  We will try to get him off the vent by next Tuesday.  If he is not extubated by then we will palliatively extubate.  In the event that he deteriorates (new septic shock) then we will notify his family and will not resuscitate.   CC time 40 minutes  YACOUB,WESAM [place] PCCM Pager: [phone] Cell: [phone] If no response, call [phone]

## 2011-11-21 ENCOUNTER — Inpatient Hospital Stay (HOSPITAL_COMMUNITY): Payer: Medicaid Other

## 2011-11-21 LAB — BLOOD GAS, ARTERIAL
Drawn by: 317871
MECHVT: 0.55 mL
RATE: 16 resp/min
TCO2: 23.7 mmol/L (ref 0–100)
pCO2 arterial: 34.4 mmHg — ABNORMAL LOW (ref 35.0–45.0)
pH, Arterial: 7.485 — ABNORMAL HIGH (ref 7.350–7.450)
pO2, Arterial: 94.1 mmHg (ref 80.0–100.0)

## 2011-11-21 LAB — BASIC METABOLIC PANEL
Calcium: 8.2 mg/dL — ABNORMAL LOW (ref 8.4–10.5)
GFR calc Af Amer: >90 mL/min (ref >90)
GFR calc non Af Amer: >90 mL/min (ref >90)
Glucose, Bld: 118 mg/dL — ABNORMAL HIGH (ref 70–99)
Sodium: 147 mEq/L — ABNORMAL HIGH (ref 135–145)

## 2011-11-21 LAB — GLUCOSE, CAPILLARY
Glucose-Capillary: 104 mg/dL — ABNORMAL HIGH (ref 70–99)
Glucose-Capillary: 115 mg/dL — ABNORMAL HIGH (ref 70–99)

## 2011-11-21 LAB — PHOSPHORUS: Phosphorus: 3.8 mg/dL (ref 2.3–4.6)

## 2011-11-21 LAB — CBC
Hemoglobin: 7.9 g/dL — ABNORMAL LOW (ref 13.0–17.0)
MCH: 28.6 pg (ref 26.0–34.0)
MCHC: 32.4 g/dL (ref 30.0–36.0)
Platelets: 641 10*3/uL — ABNORMAL HIGH (ref 150–400)
RDW: 17.6 % — ABNORMAL HIGH (ref 11.5–15.5)

## 2011-11-21 LAB — TYPE AND SCREEN
ABO/RH(D): B POS
Antibody Screen: NEGATIVE
Unit division: 0

## 2011-11-21 MED ORDER — FENTANYL CITRATE 0.05 MG/ML IJ SOLN
INTRAMUSCULAR | Status: AC | PRN
  Administered 2011-11-21 (×2): 100 ug via INTRAVENOUS

## 2011-11-21 MED ORDER — MIDAZOLAM HCL 5 MG/5ML IJ SOLN
INTRAMUSCULAR | Status: AC | PRN
  Administered 2011-11-21 (×2): 2 mg via INTRAVENOUS

## 2011-11-21 MED ORDER — MIDAZOLAM HCL 2 MG/2ML IJ SOLN
INTRAMUSCULAR | Status: AC
  Filled 2011-11-21: qty 4

## 2011-11-21 MED ORDER — FREE WATER
200.0000 mL | Freq: Four times a day (QID) | Status: DC
  Administered 2011-11-21 – 2011-11-22 (×4): 200 mL

## 2011-11-21 MED ORDER — FENTANYL CITRATE 0.05 MG/ML IJ SOLN
INTRAMUSCULAR | Status: AC
  Filled 2011-11-21: qty 4

## 2011-11-21 MED ORDER — FENTANYL CITRATE 0.05 MG/ML IJ SOLN
INTRAMUSCULAR | Status: AC
  Administered 2011-11-21: 100 ug
  Filled 2011-11-21: qty 6

## 2011-11-21 MED ORDER — MIDAZOLAM HCL 2 MG/2ML IJ SOLN
INTRAMUSCULAR | Status: AC
  Administered 2011-11-21: 09:00:00
  Filled 2011-11-21: qty 6

## 2011-11-21 NOTE — Progress Notes (Signed)
Jeffery [name] [id number] 07-04-1960   Subjective:  RN notes pt opening up ice packs & eating ice Abscesses drained this AM Pt denies much pain Wants to be extubated  Objective:  Vital signs:  Filed Vitals:   11/21/11 0923 11/21/11 0927 11/21/11 0934 11/21/11 0937  BP: 134/94 118/81 119/83 116/79  Pulse: 112 113 112 111  Temp:      TempSrc:      Resp: 16 17 16 16   Height:      Weight:      SpO2: 100% 100% 100% 100%    Last BM Date:  (has a colostomy)  Intake/Output   Yesterday:  10/05 0701 - 10/06 0700 In: 1208 [I.V.:480; NG/GT:30; IV Piggyback:418] Out: 2536 [Urine:3795; Emesis/NG output:75; Stool:15] This shift:  Total I/O In: -  Out: 275 [Urine:275]  Bowel function:  Flatus: n  BM: n  NGT with large voluminous effluent  Physical Exam:  General: Pt awake/alert/oriented x4 in no acute distress Eyes: PERRL, normal EOM.  Sclera clear.  No icterus Neuro: CN II-XII intact w/o focal sensory/motor deficits. Lymph: No head/neck/groin lymphadenopathy Psych:  No delerium/psychosis/paranoia HENT: Normocephalic, Mucus membranes moist.  No thrush Neck: Supple, No tracheal deviation Chest: No chest wall pain w good excursion CV:  Pulses intact.  Regular rhythm Abdomen: Soft.  Nondistended.  Mildly tender at incisions only.  No incarcerated hernias.  Wound vac in place.  Ostomy no gas/stool Ext:  SCDs BLE.  No mjr edema.  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 male  11 Days Post-Op  Procedure(s): LAPAROSCOPIC LYSIS OF ADHESIONS EXPLORATORY LAPAROTOMY APPLICATION OF WOUND VAC  Postop ileus  Intraabd collections, probable abscesses  Plan:  -f/u s/p CT guided drainage of fluid collection -NGT until ileus resolves.  Sips/ice chips 30mL q1h OK -wean vent as tolerated.  ?trach if not better -wound vac -IV ABx -VTE prophylaxis- SCDs, etc -mobilize as tolerated to help recovery  Long term goals of care an issue.  Stay aggressive for now  Ardeth Sportsman, M.D., F.A.C.S. Gastrointestinal and Minimally Invasive Surgery [facility] [address], [address] [place], [place] [address] [phone] Main / Paging [phone] Voice Mail   11/21/2011  CARE TEAM:  PCP: No primary provider on file.  Outpatient Care Team: Patient Care Team: Almond Lint, MD as Consulting Physician (General Surgery) Ladene Artist, MD as Consulting Physician (Medical Oncology)  Inpatient Treatment Team: Treatment Team: Attending Provider: Almond Lint, MD; Registered Nurse: Domingo Mend Tai, RN; Technician: Calton Golds, NT; Registered Nurse: Tristan Schroeder, RN; Rounding Team: Md Pccm, MD; Registered Nurse: Jairo Ben, Student-RN; Attending Physician: Ladene Artist, MD; Respiratory Therapist: Liz Beach, RRT; Consulting Physician: Palliative Doristine Johns; Consulting Physician: Ardeth Sportsman, MD; Registered Nurse: Oda Kilts, RN; Technician: Roxy Manns, NT; Registered Nurse: Virl Cagey, RN; Respiratory Therapist: Renold Genta, RRT   Results:   Labs: Results for orders placed during the hospital encounter of 11/07/11 (from  the past 48 hour(s))  GLUCOSE, CAPILLARY     Status: Abnormal   Collection Time   11/19/11 11:45 AM      Component Value Range Comment   Glucose-Capillary 110 (*) 70 - 99 mg/dL    Comment 1 Documented in Chart       Comment 2 Notify RN     GLUCOSE, CAPILLARY     Status: Abnormal   Collection Time   11/19/11  3:52 PM      Component Value Range Comment   Glucose-Capillary 124 (*) 70 - 99 mg/dL   GLUCOSE, CAPILLARY     Status: Abnormal   Collection Time   11/19/11  7:38 PM      Component Value Range Comment   Glucose-Capillary 111 (*) 70 - 99 mg/dL   GLUCOSE, CAPILLARY     Status: Abnormal   Collection Time   11/20/11 12:09 AM      Component Value Range Comment   Glucose-Capillary 112 (*) 70 - 99 mg/dL    Comment 1 Documented in Chart      Comment 2 Notify RN     BLOOD GAS, ARTERIAL     Status: Abnormal   Collection Time   11/20/11  4:01 AM      Component Value Range Comment   FIO2 0.30      Delivery systems VENTILATOR      Mode PRESSURE REGULATED VOLUME CONTROL      VT 550      Rate 16      Peep/cpap 5.0      pH, Arterial 7.485 (*) 7.350 - 7.450    pCO2 arterial 34.5 (*) 35.0 - 45.0 mmHg    pO2, Arterial 101.0 (*) 80.0 - 100.0 mmHg    Bicarbonate 25.5 (*) 20.0 - 24.0 mEq/L    TCO2 24.1  0 - 100 mmol/L    Acid-Base Excess 2.6 (*) 0.0 - 2.0 mmol/L    O2 Saturation 97.8      Patient temperature 100.1      Collection site LEFT RADIAL      Drawn by [phone]      Sample type ARTERIAL      Allens test (pass/fail) PASS  PASS   GLUCOSE, CAPILLARY     Status: Abnormal   Collection Time   11/20/11  4:08 AM      Component Value Range Comment   Glucose-Capillary 107 (*) 70 - 99 mg/dL    Comment 1 Documented in Chart      Comment 2 Notify RN     BASIC METABOLIC PANEL     Status: Abnormal   Collection Time   11/20/11  4:25 AM      Component Value Range Comment   Sodium 143  135 - 145 mEq/L    Potassium 3.9  3.5 - 5.1 mEq/L    Chloride 110  96 - 112 mEq/L    CO2 27  19 - 32 mEq/L    Glucose, Bld 115 (*) 70 - 99 mg/dL    BUN 23  6 - 23 mg/dL    Creatinine, Ser 0.45  0.50 - 1.35 mg/dL    Calcium 7.7 (*) 8.4 - 10.5 mg/dL    GFR calc non Af Amer >90  >90 mL/min    GFR calc Af Amer >90  >90 mL/min    CBC WITH DIFFERENTIAL     Status: Abnormal   Collection Time   11/20/11  4:25 AM      Component Value  Range Comment   WBC 17.6 (*) 4.0 - 10.5 K/uL    RBC 2.64 (*) 4.22 - 5.81 MIL/uL    Hemoglobin 7.6 (*) 13.0 - 17.0 g/dL    HCT 16.1 (*) 09.6 - 52.0 %    MCV 87.1  78.0 - 100.0 fL    MCH 28.8  26.0 - 34.0 pg    MCHC 33.0  30.0 - 36.0 g/dL    RDW 04.5 (*) 40.9 - 15.5 %    Platelets 516 (*) 150 - 400 K/uL    Neutrophils Relative 86 (*) 43 - 77 %    Lymphocytes Relative 6 (*) 12 - 46 %    Monocytes Relative 8  3 - 12 %    Eosinophils Relative 0  0 - 5 %    Basophils Relative 0  0 - 1 %    Neutro Abs 15.1 (*) 1.7 - 7.7 K/uL    Lymphs Abs 1.1  0.7 - 4.0 K/uL    Monocytes Absolute 1.4 (*) 0.1 - 1.0 K/uL    Eosinophils Absolute 0.0  0.0 - 0.7 K/uL    Basophils Absolute 0.0  0.0 - 0.1 K/uL    Smear Review MORPHOLOGY UNREMARKABLE     PROTIME-INR     Status: Abnormal   Collection Time   11/20/11  4:25 AM      Component Value Range Comment   Prothrombin Time 16.5 (*) 11.6 - 15.2 seconds    INR 1.37  0.00 - 1.49   APTT     Status: Normal   Collection Time   11/20/11  4:25 AM      Component Value Range Comment   aPTT 37  24 - 37 seconds   GLUCOSE, CAPILLARY     Status: Abnormal   Collection Time   11/20/11  8:00 AM      Component Value Range Comment   Glucose-Capillary 118 (*) 70 - 99 mg/dL   GLUCOSE, CAPILLARY     Status: Abnormal   Collection Time   11/20/11 11:50 AM      Component Value Range Comment   Glucose-Capillary 136 (*) 70 - 99 mg/dL   GLUCOSE, CAPILLARY     Status: Abnormal   Collection Time   11/20/11  3:44 PM      Component Value Range Comment   Glucose-Capillary 102 (*) 70 - 99 mg/dL    Comment 1 Documented in Chart      Comment 2 Notify RN     GLUCOSE, CAPILLARY     Status: Abnormal   Collection Time   11/20/11  7:33 PM      Component Value Range Comment   Glucose-Capillary 114 (*) 70 - 99 mg/dL    Comment 1 Notify RN      Comment 2 Documented in Chart      GLUCOSE, CAPILLARY     Status: Abnormal   Collection Time   11/20/11 11:57 PM      Component Value Range Comment   Glucose-Capillary 106 (*) 70 - 99 mg/dL    Comment 1 Documented in Chart      Comment 2 Notify RN     CBC     Status: Abnormal   Collection Time   11/21/11  4:25 AM      Component Value Range Comment   WBC 16.5 (*) 4.0 - 10.5 K/uL    RBC 2.76 (*) 4.22 - 5.81 MIL/uL    Hemoglobin 7.9 (*) 13.0 - 17.0 g/dL  HCT 24.4 (*) 39.0 - 52.0 %    MCV 88.4  78.0 - 100.0 fL    MCH 28.6  26.0 - 34.0 pg    MCHC 32.4  30.0 - 36.0 g/dL    RDW 21.3 (*) 08.6 - 15.5 %    Platelets 641 (*) 150 - 400 K/uL   BASIC METABOLIC PANEL     Status: Abnormal   Collection Time   11/21/11  4:25 AM      Component Value Range Comment   Sodium 147 (*) 135 - 145 mEq/L    Potassium 4.2  3.5 - 5.1 mEq/L    Chloride 110  96 - 112 mEq/L    CO2 26  19 - 32 mEq/L    Glucose, Bld 118 (*) 70 - 99 mg/dL    BUN 24 (*) 6 - 23 mg/dL    Creatinine, Ser 5.78  0.50 - 1.35 mg/dL    Calcium 8.2 (*) 8.4 - 10.5 mg/dL    GFR calc non Af Amer >90  >90 mL/min    GFR calc Af Amer >90  >90 mL/min   MAGNESIUM     Status: Normal   Collection Time   11/21/11  4:25 AM      Component Value Range Comment   Magnesium 2.1  1.5 - 2.5 mg/dL   PHOSPHORUS     Status: Normal   Collection Time   11/21/11  4:25 AM      Component Value Range Comment   Phosphorus 3.8  2.3 - 4.6 mg/dL   BLOOD GAS, ARTERIAL     Status: Abnormal   Collection Time   11/21/11  4:38 AM      Component Value Range Comment   FIO2 0.30      Delivery systems VENTILATOR      Mode PRESSURE REGULATED VOLUME CONTROL      VT 0.550      Rate 16.0      Peep/cpap 5.0      pH, Arterial 7.485 (*) 7.350 - 7.450    pCO2 arterial 34.4 (*) 35.0 - 45.0 mmHg    pO2, Arterial 94.1  80.0 - 100.0 mmHg    Bicarbonate 25.7 (*) 20.0 - 24.0 mEq/L    TCO2 23.7  0 - 100 mmol/L    Acid-Base Excess 2.7 (*) 0.0 - 2.0 mmol/L    O2 Saturation 97.3      Patient temperature 98.7       Collection site RIGHT RADIAL      Drawn by [id number]      Sample type ARTERIAL DRAW      Allens test (pass/fail) PASS  PASS   GLUCOSE, CAPILLARY     Status: Abnormal   Collection Time   11/21/11  4:43 AM      Component Value Range Comment   Glucose-Capillary 114 (*) 70 - 99 mg/dL    Comment 1 Documented in Chart      Comment 2 Notify RN     GLUCOSE, CAPILLARY     Status: Abnormal   Collection Time   11/21/11  7:34 AM      Component Value Range Comment   Glucose-Capillary 104 (*) 70 - 99 mg/dL     Imaging / Studies: Ct Abdomen Pelvis Wo Contrast  11/20/2011  *RADIOLOGY REPORT*  Clinical Data: Small bowel obstruction, intra abdominal fluid collections  CT ABDOMEN AND PELVIS WITHOUT CONTRAST  Technique:  Multidetector CT imaging of the abdomen and pelvis was performed following the standard  protocol without intravenous contrast.  Comparison:   the previous day's study  Findings: Multiple pulmonary nodules in the visualized lung bases. Nasogastric tube extends into decompressed stomach.  Persistent left upper quadrant peritoneal fluid collection in measuring at least 10 x 15.5 cm, displacing the spleen medially.  Persistent large collection just cephalad to the urinary bladder measuring up to 20 cm   diameter.  Stable right transgluteal drainage catheter which was probably withdrawn partially since its original placement.  Unremarkable uninfused evaluation of liver, spleen, adrenal glands, pancreas.  There is persistent residual contrast in the decompressed renal collecting systems.  Patchy areas of persistent nephrogram in the left kidney which can be seen in the setting of pyelonephritis.  Atheromatous abdominal aorta.  There are dilated loops of bowel in the mid abdomen measuring up to 4.7 cm diameter at the level of anastomotic staple line.  There is residual oral contrast material in the decompressed colon.  There is a left lower quadrant diverting colostomy.  IMPRESSION:  1.  Some interval  increase in degree of distention of mid abdominal small bowel loops.  Limited evaluation secondary to lack of oral contrast.  Unenhanced CT was performed per clinician order.  Lack of IV contrast limits sensitivity and specificity, especially for evaluation of abdominal/pelvic solid viscera. 2.  Little change in large peritoneal fluid collections previously described.   Original Report Authenticated By: Osa Craver, M.D.    Ct Abdomen Pelvis W Contrast  11/19/2011  *RADIOLOGY REPORT*  Clinical Data: Postoperative fever and leukocytosis. 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  CT ABDOMEN AND PELVIS WITH CONTRAST  Technique:  Multidetector CT imaging of the abdomen and pelvis was performed following the standard protocol during bolus administration of intravenous contrast.  Contrast: OMNIPAQUE IOHEXOL 300 MG/ML  SOLN  Comparison: 10/12/2011.  Findings: Increased size and number of multiple masses in both lungs.  The largest is in the right lower lobe, measuring 1.8 cm in maximum diameter on image #1.  The previously demonstrated transgluteal drainage catheter in the presacral space is more laterally positioned on the right at this time.  There is some fluid in the prerectal space, measuring 6.7 x 2.9 cm on image number 81.  Interval development of a large collection of fluid in the left upper abdomen, beneath the left hemidiaphragm, displacing the spleen and stomach to the right.  On image number 18, this measures 15.7 x 10.7 cm in maximum dimensions.  On coronal image number 57, this measures 10.7 cm in length. This has a thin surrounding enhancing rim.  Interval development of a large collection of fluid in the pelvis and lower abdomen, measuring 22.4 x 13.9 cm in maximum dimensions on coronal image number 30 and 9.8 cm in AP diameter on axial image number 73.  On coronal image number 34, this measures 21.9 x 21.1 cm in maximum dimensions.  This also has a thin surrounding enhancing  rim.  Nasogastric tube tip in the distal stomach.  No dilated bowel loops are seen at this time.  The liver, pancreas, adrenal glands, kidneys and urinary bladder are unremarkable.  A Foley catheter is in the bladder.  Normal sized prostate gland.  The gallbladder is poorly distended with diffuse wall enhancement and small amount of pericholecystic fluid.  There is also a small amount of free peritoneal fluid in addition to the fluid collections described above.  Mild left lower lobe atelectasis.  Unremarkable bones.  IMPRESSION:  1.  Two very large  abscesses in the abdomen and pelvis, as described above. 2.  Interval displacement of the presacral drainage catheter with redevelopment of a small amount of fluid/abscess in the presacral space. 3.  Progressive pulmonary metastatic disease. 4.  Mild left lower lobe atelectasis. 5.  Small amount of free peritoneal fluid, including pericholecystic fluid.  These results will be called to the ordering clinician or representative by the Radiologist Assistant, and communication documented in the PACS Dashboard.   Original Report Authenticated By: Darrol Angel, M.D.    Dg Chest Port 1 View  11/20/2011  *RADIOLOGY REPORT*  Clinical Data: Check ET tube position.  Hypertension.  No new lung mets from rectal carcinoma.  PORTABLE CHEST - 1 VIEW  Comparison: 11/19/2011  Findings: Endotracheal tube tip lies 3.3 cm above the carina, without significant change, well positioned.  Nasogastric tube passes below the diaphragm into the stomach. Right anterior chest wall power Port-A-Cath has its tips at the caval atrial junction.  Cardiac silhouette is normal in size and configuration.  No mediastinal or hilar mass or adenopathy.  At least three discrete right lung nodules are noted, stable.  Mild atelectasis at the medial left lung base.  Lungs are otherwise clear.  IMPRESSION: Lines and tubes are well positioned and stable.  No acute cardiopulmonary disease.   Original Report  Authenticated By: Domenic Moras, M.D.    Dg Abd Portable 1v  11/20/2011  *RADIOLOGY REPORT*  Clinical Data: Abdominal distention  PORTABLE ABDOMEN - 1 VIEW  Comparison:   the previous day's study  Findings: Nasogastric tube has been advanced into the decompressed stomach.  There are a few dilated small bowel loops in the mid abdomen.  The colon is decompressed.  Pigtail drainage catheter projects in the right pelvis.  Ostomy device is noted in the left lower abdomen.  Pulmonary nodules are noted in the visualized lung bases.  IMPRESSION:  1.  Dilated small bowel loops suggesting mid small bowel obstruction.   Original Report Authenticated By: Osa Craver, M.D.     Medications / Allergies: per chart  Antibiotics: Anti-infectives     Start     Dose/Rate Route Frequency Ordered Stop   11/17/11 1400   vancomycin (VANCOCIN) IVPB 1000 mg/200 mL premix  Status:  Discontinued        1,000 mg 200 mL/hr over 60 Minutes Intravenous Every 8 hours 11/17/11 0802 11/19/11 0934   11/15/11 1400  imipenem-cilastatin (PRIMAXIN) 500 mg in sodium chloride 0.9 % 100 mL IVPB       500 mg 200 mL/hr over 30 Minutes Intravenous 3 times per day 11/15/11 0931     11/15/11 0615   vancomycin (VANCOCIN) 750 mg in sodium chloride 0.9 % 150 mL IVPB  Status:  Discontinued        750 mg 150 mL/hr over 60 Minutes Intravenous 3 times per day 11/15/11 0608 11/17/11 0801   11/14/11 1800   imipenem-cilastatin (PRIMAXIN) 250 mg in sodium chloride 0.9 % 100 mL IVPB  Status:  Discontinued        250 mg 200 mL/hr over 30 Minutes Intravenous 4 times per day 11/14/11 1434 11/15/11 0931   11/13/11 1800   vancomycin (VANCOCIN) 750 mg in sodium chloride 0.9 % 150 mL IVPB  Status:  Discontinued        750 mg 150 mL/hr over 60 Minutes Intravenous Every 12 hours 11/13/11 0631 11/15/11 0608   11/13/11 0645   vancomycin (VANCOCIN) 750 mg in sodium chloride 0.9 %  150 mL IVPB        750 mg 150 mL/hr over 60 Minutes  Intravenous  Once 11/13/11 0631 11/13/11 0738   11/12/11 1200   imipenem-cilastatin (PRIMAXIN) 500 mg in sodium chloride 0.9 % 100 mL IVPB  Status:  Discontinued        500 mg 200 mL/hr over 30 Minutes Intravenous 3 times per day 11/12/11 1015 11/14/11 1434   11/11/11 2000   micafungin (MYCAMINE) 100 mg in sodium chloride 0.9 % 100 mL IVPB     Comments: stat      100 mg 100 mL/hr over 1 Hours Intravenous Every 24 hours 11/10/11 1939     11/11/11 1800   vancomycin (VANCOCIN) 500 mg in sodium chloride 0.9 % 100 mL IVPB  Status:  Discontinued        500 mg 100 mL/hr over 60 Minutes Intravenous Every 12 hours 11/11/11 1014 11/13/11 0631   11/10/11 2000   micafungin (MYCAMINE) 100 mg in sodium chloride 0.9 % 100 mL IVPB     Comments: stat      100 mg 100 mL/hr over 1 Hours Intravenous Daily 11/10/11 1939 11/10/11 2215   11/10/11 2000   imipenem-cilastatin (PRIMAXIN) 250 mg in sodium chloride 0.9 % 100 mL IVPB  Status:  Discontinued        250 mg 200 mL/hr over 30 Minutes Intravenous 4 times per day 11/10/11 1951 11/12/11 1014   11/10/11 1630   fluconazole (DIFLUCAN) IVPB 200 mg  Status:  Discontinued        200 mg 100 mL/hr over 60 Minutes Intravenous Every 24 hours 11/10/11 1617 11/10/11 1939   11/10/11 1630   vancomycin (VANCOCIN) 500 mg in sodium chloride 0.9 % 100 mL IVPB  Status:  Discontinued        500 mg 100 mL/hr over 60 Minutes Intravenous 3 times per day 11/10/11 1617 11/11/11 1014   11/10/11 1630   piperacillin-tazobactam (ZOSYN) IVPB 3.375 g  Status:  Discontinued        3.375 g 12.5 mL/hr over 240 Minutes Intravenous 3 times per day 11/10/11 1617 11/10/11 1941   11/10/11 0933   cefOXitin (MEFOXIN) 2 g in dextrose 5 % 50 mL IVPB        2 g 100 mL/hr over 30 Minutes Intravenous 60 min pre-op 11/10/11 0926 11/10/11 1105   11/10/11 0930   cefOXitin (MEFOXIN) 1 g in dextrose 5 % 50 mL IVPB  Status:  Discontinued        1 g 100 mL/hr over 30 Minutes Intravenous 3 times  per day 11/10/11 0924 11/10/11 0926   11/07/11 2200   cefTRIAXone (ROCEPHIN) 1 g in dextrose 5 % 50 mL IVPB  Status:  Discontinued        1 g 100 mL/hr over 30 Minutes Intravenous Every 24 hours 11/07/11 2124 11/10/11 1536   11/07/11 1830   cefTRIAXone (ROCEPHIN) 1 g in dextrose 5 % 50 mL IVPB        1 g 100 mL/hr over 30 Minutes Intravenous  Once 11/07/11 1819 11/07/11 1920

## 2011-11-21 NOTE — Procedures (Signed)
Procedure:  CT guided drainage of peritoneal abscess/fluid Findings:  Thin fluid.  Sample sent for culture.  12 Fr drain placed to suction bulb via LLQ approach.

## 2011-11-21 NOTE — Progress Notes (Signed)
PULMONARY / CCM CONSULT NOTE  Requesting MD/Service: Byerly/CCS Date of admission: 9/22 Date of consult: 9/25 Reason for consultation: post op VDRF  Pt Profile:  51 yobm with history of recurrent metastatic rectal cancer underwent exploratory laparotomy, small bowel resection x2, incidental appendectomy 9/25. Was thought to have aspirated in PACU post operatively with feculent emesis reported. Required reintubation in PACU. PCCM asked to assist with vent/CCM issues.  Lines, Tubes, etc: ETT 9/25 >> 9/27>>>9/28>>> RUE PICC 9/24 >>  R [place] Portacath (longstanding)   Microbiology: Urine 9/22 >> Klebs (pansens) PCT 9/26: >200 10/1 blood >> 10/1 resp >> 10/1 urine >>neg 10/2 wound >> neg  Antibiotics:  Fluconazole 9/25 >> 9/25 Zosyn 9/25 >> 9/25 Vanc 9/25>>>10/4 Micafungin 9/26>>> Imipenem 9/25>>>  Studies/Events: 10/4 CT Abd >>   Consults:  CCS (primary)  Best Practice: DVT: scd SUP: PPI Nutrition: enteral feedings Glycemic control: SSI  Sedation/analgesia: continuous sedation  SUBJ: Failed SBT this AM, but mental status much improved this morning WBC up, Hgb down.    BP 109/82  Pulse 116  Temp 97.7 F (36.5 C) (Oral)  Resp 16  Ht 6\' 2"  (1.88 m)  Wt 56.8 kg (125 lb 3.5 oz)  BMI 16.08 kg/m2  SpO2 100%   EXAM:  Gen: interactive on vent HEENT: NCAT, PERRL PULM: few crackles in bases CV : Tachy, regular AB: BS+, wound vac Ext: warm, scd's Neuro: following commands, awake  IMPRESSION:   Pulm:  ABG    Component Value Date/Time   PHART 7.485* 11/21/2011 0438   PCO2ART 34.4* 11/21/2011 0438   PO2ART 94.1 11/21/2011 0438   HCO3 25.7* 11/21/2011 0438   TCO2 23.7 11/21/2011 0438   ACIDBASEDEF 2.0 11/14/2011 1100   O2SAT 97.3 11/21/2011 0438   PCXR: bilateral airspace disease  Without change  Acute respiratory failure - post op abd surgery with likely aspiration, active smoker. Aspiration PNA resolved ARDS +/- element of edema, oxygenation improved  greatly Pulmonary metastasis  Reintubated on 9/28, tolerating vent well for now, continue full support .  Failed SBT 10/5.  PLAN -  Re-attempt SBT again in AM since went down for drain today. Change sedation to prn. If not off vent next week family wants palliative extubation. Continue lasix BID. Cont BDs. See ID section.  Cardiology: Severe sepsis/occult shock -- resolved  Tachycardia - in setting of fever / pain Shock resolved.  PLAN -  KVO IVF. Diurese BID.  Renal:   Lab 11/21/11 0425 11/20/11 0425 11/19/11 0400 11/15/11 0440  NA 147* 143 146* --  CL 110 110 110 --  K 4.2 3.9 3.2* --  CO2 26 27 28  --  BUN 24* 23 27* --  CREATININE 0.72 0.68 0.79 --  GLUCOSE 118* 115* 129* --  MG 2.1 -- 1.6 2.1    Intake/Output Summary (Last 24 hours) at 11/21/11 1040 Last data filed at 11/21/11 0800  Gross per 24 hour  Intake   1114 ml  Output   3010 ml  Net  -1896 ml   Hypernatremia with likely free water loss Resolved renal failure Hypokalemia from diuresis Hypernatremia Plan Increase free water 200 Q6 Lasix BID  GI: S/p exp lap - small bowel resection with fecal contamination  Rectal ca now w/ mets to pelvis and lung PLAN -  Surgery following Cont VAC Enteral tube feeds NG tube  CT abd with continued fever, distention, drain placed by IR today.  HEME:  Lab 11/21/11 0425 11/20/11 0425 11/19/11 0400  HGB 7.9* 7.6* 6.7*  HCT 24.4* 23.0* 20.6*  PLT 641* 516* 409*  anemia of critical illness Plan: Trigger <7 (transfuse 1U PRBC today). Trend CBC. PPI.  Endocrine CBG (last 3)   Basename 11/21/11 0734 11/21/11 0443 11/20/11 2357  GLUCAP 104* 114* 106*  Hyperglycemia Plan: ssi  Infectious disease:  Peritonitis Aspiration PNA --  Pct improved  Buttock abscess Still febrile, WBC up.  PLAN -  Cont broad spectrum abx and micafungin Follow wound culture of buttock wound CT abdomen today D/C vanc, no positive cultures  Neuro: Sedated on vent P: Cont  current sedation protocol SBT/WUA   Goals of care: Lengthy discussion with family was held on 10/2 with PCCM.  His code status is no CPR, no pressors.  We will try to get him off the vent by next Tuesday.  If he is not extubated by then we will palliatively extubate.  In the event that he deteriorates (new septic shock) then we will notify his family and will not resuscitate.   CC time 31 minutes  Mariany Mackintosh [place] [facility] Pager: [phone] Cell: [phone] If no response, call [phone]

## 2011-11-22 ENCOUNTER — Inpatient Hospital Stay (HOSPITAL_COMMUNITY): Payer: Medicaid Other

## 2011-11-22 DIAGNOSIS — K651 Peritoneal abscess: Secondary | ICD-10-CM

## 2011-11-22 LAB — BLOOD GAS, ARTERIAL
Bicarbonate: 26.3 mEq/L — ABNORMAL HIGH (ref 20.0–24.0)
FIO2: 0.3 %
O2 Saturation: 98 %
PEEP: 5 cmH2O
TCO2: 24.6 mmol/L (ref 0–100)
pO2, Arterial: 101 mmHg — ABNORMAL HIGH (ref 80.0–100.0)

## 2011-11-22 LAB — CBC
Hemoglobin: 10.3 g/dL — ABNORMAL LOW (ref 13.0–17.0)
MCH: 28 pg (ref 26.0–34.0)
Platelets: 647 10*3/uL — ABNORMAL HIGH (ref 150–400)
RBC: 3.68 MIL/uL — ABNORMAL LOW (ref 4.22–5.81)
WBC: 11.5 10*3/uL — ABNORMAL HIGH (ref 4.0–10.5)

## 2011-11-22 LAB — MAGNESIUM: Magnesium: 2 mg/dL (ref 1.5–2.5)

## 2011-11-22 LAB — COMPREHENSIVE METABOLIC PANEL
BUN: 26 mg/dL — ABNORMAL HIGH (ref 6–23)
CO2: 25 mEq/L (ref 19–32)
Calcium: 8.3 mg/dL — ABNORMAL LOW (ref 8.4–10.5)
Chloride: 105 mEq/L (ref 96–112)
Creatinine, Ser: 0.7 mg/dL (ref 0.50–1.35)
GFR calc Af Amer: >90 mL/min (ref >90)
GFR calc non Af Amer: >90 mL/min (ref >90)
Glucose, Bld: 102 mg/dL — ABNORMAL HIGH (ref 70–99)
Total Bilirubin: 0.4 mg/dL (ref 0.3–1.2)

## 2011-11-22 LAB — GLUCOSE, CAPILLARY
Glucose-Capillary: 108 mg/dL — ABNORMAL HIGH (ref 70–99)
Glucose-Capillary: 104 mg/dL — ABNORMAL HIGH (ref 70–99)
Glucose-Capillary: 122 mg/dL — ABNORMAL HIGH (ref 70–99)
Glucose-Capillary: 98 mg/dL (ref 70–99)
Glucose-Capillary: 99 mg/dL (ref 70–99)

## 2011-11-22 LAB — CULTURE, BLOOD (ROUTINE X 2): Culture: NO GROWTH

## 2011-11-22 LAB — PHOSPHORUS: Phosphorus: 3.6 mg/dL (ref 2.3–4.6)

## 2011-11-22 MED ORDER — SODIUM CHLORIDE 0.9 % IV SOLN
250.0000 mg | INTRAVENOUS | Status: AC
  Administered 2011-11-22: 250 mg via INTRAVENOUS
  Filled 2011-11-22: qty 250

## 2011-11-22 MED ORDER — SODIUM CHLORIDE 0.9 % IV SOLN
250.0000 mg | Freq: Four times a day (QID) | INTRAVENOUS | Status: AC
  Administered 2011-11-22 – 2011-11-30 (×30): 250 mg via INTRAVENOUS
  Filled 2011-11-22 (×33): qty 250

## 2011-11-22 MED ORDER — POTASSIUM CHLORIDE 10 MEQ/50ML IV SOLN
10.0000 meq | INTRAVENOUS | Status: AC
  Administered 2011-11-22 (×4): 10 meq via INTRAVENOUS
  Filled 2011-11-22: qty 200

## 2011-11-22 MED ORDER — ACETAMINOPHEN 650 MG RE SUPP: 650.0000 mg | Freq: Four times a day (QID) | RECTAL | Status: DC | PRN

## 2011-11-22 MED ORDER — ACETAMINOPHEN 325 MG PO TABS: 325.0000 mg | ORAL_TABLET | Freq: Four times a day (QID) | ORAL | Status: DC | PRN

## 2011-11-22 NOTE — Progress Notes (Signed)
12 Days Post-Op   Subjective: Appears comfortable.  Denies new complaints.    Objective: Vital signs in last 24 hours: Temp:  [98.2 F (36.8 C)-99.8 F (37.7 C)] 99.4 F (37.4 C) (10/07 0805) Pulse Rate:  [100-117] 102  (10/07 0600) Resp:  [16-24] 16  (10/07 0600) BP: (109-146)/(82-96) 142/91 mmHg (10/07 0600) SpO2:  [99 %-100 %] 100 % (10/07 0600) FiO2 (%):  [30 %] 30 % (10/07 0806) Weight:  [113 lb 5.1 oz (51.4 kg)] 113 lb 5.1 oz (51.4 kg) (10/07 0000) Last BM Date:  (Colostomy)  Intake/Output from previous day: 10/06 0701 - 10/07 0700 In: 1749 [P.O.:200; I.V.:440; IV Piggyback:464] Out: 2250 [Urine:1950; Drains:300] Intake/Output this shift: Total I/O In: -  Out: 155 [Urine:125; Drains:30]  Physical exam :  Drains intact. Insertion site clean and dry to RLQ drain placed by IR.  220 ml output recorded for 10/6 after placement.  30 ml output so far today. Mostly amber tinged serous fluid in JP. Tan feculent smelling drainage from TG drain .  Cultures pending.  Improved leukocytosis.     Lab Results:   Basename 11/22/11 0330 11/21/11 0425  WBC 11.5* 16.5*  HGB 10.3* 7.9*  HCT 32.5* 24.4*  PLT 647* 641*   BMET  Basename 11/22/11 0330 11/21/11 0425  NA 143 147*  K 3.3* 4.2  CL 105 110  CO2 25 26  GLUCOSE 102* 118*  BUN 26* 24*  CREATININE 0.70 0.72  CALCIUM 8.3* 8.2*   PT/INR  Basename 11/20/11 0425  LABPROT 16.5*  INR 1.37   ABG  Basename 11/22/11 0410 11/21/11 0438  PHART 7.503* 7.485*  HCO3 26.3* 25.7*    Studies/Results: Ct Guided Abscess Drain  11/21/2011  *RADIOLOGY REPORT*  Clinical Data: Status post bowel resection surgery and history of metastatic rectal carcinoma.  The patient has developed peritoneal fluid collections now requiring drainage.  CT GUIDED DRAINAGE OF PERITONEAL ABSCESS  Sedation:  4.0 mg IV Versed;  200 mcg IV Fentanyl  Total Moderate Sedation Time: 15 minutes.  Procedure:  The procedure, risks, benefits, and alternatives were  explained to the patient.  Questions regarding the procedure were encouraged and answered. The patient understands and consents to the procedure.  The left lower quadrant abdominal wall was prepped with Betadine in a sterile fashion, and a sterile drape was applied covering the operative field.  A sterile gown and sterile gloves were used for the procedure. Local anesthesia was provided with 1% Lidocaine.  An 18 gauge needle was advanced into fluid within the left lower peritoneal cavity. Fluid was aspirated and a sample sent for culture analysis.  A guidewire was advanced.  The tract was dilated and a 12-French pigtail drainage catheter placed.  This catheter was connected to a suction bulb.  The catheter was secured at the skin with a Prolene retention suture.  Complications: None  Findings: Aspirated fluid was thin and amber in color.  A drain was placed in the left lower quadrant.  This does appear to ultimately communicate with another dominant pocket of fluid in the upper quadrant.  IMPRESSION: CT guided drainage of the left lower quadrant peritoneal fluid collection fluid appears non purulent and thin in nature.  A sample was sent for culture analysis. A 12 French drainage catheter was placed and connected to a suction bulb.   Original Report Authenticated By: Reola Calkins, M.D.    Dg Chest Port 1 View  11/22/2011  *RADIOLOGY REPORT*  Clinical Data: Intubation, rectal carcinoma with  pulmonary metastases, hypertension  PORTABLE CHEST - 1 VIEW  Comparison: Portable exam 0709 hours compared to 11/22/2011  Findings: Tip of endotracheal tube 3.3 cm above carina. Nasogastric tube into stomach. Right subclavian power port, tip projecting over SVC. Normal heart size, mediastinal contours, and pulmonary vascularity. Minimal bibasilar atelectasis. Lungs otherwise clear. 2.1 x 2.1 cm right perihilar nodule/metastasis again noted. Additional subtle nodular foci in right lung. No definite pleural effusion or  pneumothorax. No acute osseous findings.  IMPRESSION: Minimal bibasilar atelectasis. Pulmonary metastases. Satisfactory line and tube positions.   Original Report Authenticated By: Lollie Marrow, M.D.    Dg Chest Port 1 View  11/22/2011  *RADIOLOGY REPORT*  Clinical Data: 51 year old male endotracheal tube placement. Metastatic adenocarcinoma.  Respiratory failure.  PORTABLE CHEST - 1 VIEW  Comparison: 11/21/2011 and earlier.  Findings: Portable semi upright AP view 0445 hours.  Endotracheal tube has been advanced.  Tip at the right mainstem bronchus. Enteric tube courses to the abdomen, tip not included.  Stable right chest Port-A-Cath and right PICC line.  Increased opacity at the left lung base.  Stable lung volumes overall.  Cardiac size and mediastinal contours are within normal limits.  No pneumothorax.  No large pleural effusion.  Bilateral pulmonary masses.  IMPRESSION: 1.  Right mainstem bronchus intubation.  Retract the endotracheal tube 4-5 cm for optimal placement. 2.  Increased left lung base atelectasis. 3.  Pulmonary metastatic disease.  Study discussed by telephone with RN Verl Blalock on 11/22/2011 at 0545 hours.   Original Report Authenticated By: Harley Hallmark, M.D.    Dg Chest Port 1 View  11/21/2011  *RADIOLOGY REPORT*  Clinical Data: Endotracheal tube placement.  PORTABLE CHEST - 1 VIEW  Comparison: 11/20/2011  Findings: Endotracheal tube is in place with tip approximate 3.6 cm above carina.  Nasogastric tube is in place with tip off the film but beyond the gastroesophageal junction.  Right-sided power port tip overlies the level of the upper right atrium.  The  Heart size is normal.  There is mild interstitial edema versus pulmonary infiltrates.  IMPRESSION:  1.  Endotracheal tube as described. 2.  Pulmonary infiltrates versus edema.   Original Report Authenticated By: Patterson Hammersmith, M.D.     Anti-infectives: Anti-infectives     Start     Dose/Rate Route Frequency Ordered Stop    11/17/11 1400   vancomycin (VANCOCIN) IVPB 1000 mg/200 mL premix  Status:  Discontinued        1,000 mg 200 mL/hr over 60 Minutes Intravenous Every 8 hours 11/17/11 0802 11/19/11 0934   11/15/11 1400   imipenem-cilastatin (PRIMAXIN) 500 mg in sodium chloride 0.9 % 100 mL IVPB        500 mg 200 mL/hr over 30 Minutes Intravenous 3 times per day 11/15/11 0931     11/15/11 0615   vancomycin (VANCOCIN) 750 mg in sodium chloride 0.9 % 150 mL IVPB  Status:  Discontinued        750 mg 150 mL/hr over 60 Minutes Intravenous 3 times per day 11/15/11 0608 11/17/11 0801   11/14/11 1800   imipenem-cilastatin (PRIMAXIN) 250 mg in sodium chloride 0.9 % 100 mL IVPB  Status:  Discontinued        250 mg 200 mL/hr over 30 Minutes Intravenous 4 times per day 11/14/11 1434 11/15/11 0931   11/13/11 1800   vancomycin (VANCOCIN) 750 mg in sodium chloride 0.9 % 150 mL IVPB  Status:  Discontinued  750 mg 150 mL/hr over 60 Minutes Intravenous Every 12 hours 11/13/11 0631 11/15/11 0608   11/13/11 0645   vancomycin (VANCOCIN) 750 mg in sodium chloride 0.9 % 150 mL IVPB        750 mg 150 mL/hr over 60 Minutes Intravenous  Once 11/13/11 0631 11/13/11 0738   11/12/11 1200   imipenem-cilastatin (PRIMAXIN) 500 mg in sodium chloride 0.9 % 100 mL IVPB  Status:  Discontinued        500 mg 200 mL/hr over 30 Minutes Intravenous 3 times per day 11/12/11 1015 11/14/11 1434   11/11/11 2000   micafungin (MYCAMINE) 100 mg in sodium chloride 0.9 % 100 mL IVPB     Comments: stat      100 mg 100 mL/hr over 1 Hours Intravenous Every 24 hours 11/10/11 1939     11/11/11 1800   vancomycin (VANCOCIN) 500 mg in sodium chloride 0.9 % 100 mL IVPB  Status:  Discontinued        500 mg 100 mL/hr over 60 Minutes Intravenous Every 12 hours 11/11/11 1014 11/13/11 0631   11/10/11 2000   micafungin (MYCAMINE) 100 mg in sodium chloride 0.9 % 100 mL IVPB     Comments: stat      100 mg 100 mL/hr over 1 Hours Intravenous Daily  11/10/11 1939 11/10/11 2215   11/10/11 2000   imipenem-cilastatin (PRIMAXIN) 250 mg in sodium chloride 0.9 % 100 mL IVPB  Status:  Discontinued        250 mg 200 mL/hr over 30 Minutes Intravenous 4 times per day 11/10/11 1951 11/12/11 1014   11/10/11 1630   fluconazole (DIFLUCAN) IVPB 200 mg  Status:  Discontinued        200 mg 100 mL/hr over 60 Minutes Intravenous Every 24 hours 11/10/11 1617 11/10/11 1939   11/10/11 1630   vancomycin (VANCOCIN) 500 mg in sodium chloride 0.9 % 100 mL IVPB  Status:  Discontinued        500 mg 100 mL/hr over 60 Minutes Intravenous 3 times per day 11/10/11 1617 11/11/11 1014   11/10/11 1630   piperacillin-tazobactam (ZOSYN) IVPB 3.375 g  Status:  Discontinued        3.375 g 12.5 mL/hr over 240 Minutes Intravenous 3 times per day 11/10/11 1617 11/10/11 1941   11/10/11 0933   cefOXitin (MEFOXIN) 2 g in dextrose 5 % 50 mL IVPB        2 g 100 mL/hr over 30 Minutes Intravenous 60 min pre-op 11/10/11 0926 11/10/11 1105   11/10/11 0930   cefOXitin (MEFOXIN) 1 g in dextrose 5 % 50 mL IVPB  Status:  Discontinued        1 g 100 mL/hr over 30 Minutes Intravenous 3 times per day 11/10/11 0924 11/10/11 0926   11/07/11 2200   cefTRIAXone (ROCEPHIN) 1 g in dextrose 5 % 50 mL IVPB  Status:  Discontinued        1 g 100 mL/hr over 30 Minutes Intravenous Every 24 hours 11/07/11 2124 11/10/11 1536   11/07/11 1830   cefTRIAXone (ROCEPHIN) 1 g in dextrose 5 % 50 mL IVPB        1 g 100 mL/hr over 30 Minutes Intravenous  Once 11/07/11 1819 11/07/11 1920          Assessment/Plan: s/p  Percutaneous drainage of peritoneal abscess 11/21/11. Continue drain, follow cultures, output.  IR to follow.     LOS: 15 days    Jeffery [name] D 11/22/2011

## 2011-11-22 NOTE — Progress Notes (Signed)
Jeffery [name] [id number] 07-14-1960   Subjective:  Pt denies much pain Wants to be extubated  Objective:  Vital signs:  Filed Vitals:   11/22/11 0320 11/22/11 0400 11/22/11 0527 11/22/11 0600  BP: 144/89 122/86 145/91 142/91  Pulse:  104 107 102  Temp:  99.8 F (37.7 C)    TempSrc:  Oral    Resp: 16 16 19 16   Height:      Weight:      SpO2:  100% 100% 100%    Last BM Date:  (Colostomy)  Intake/Output   Yesterday:  10/06 0701 - 10/07 0700 In: 1749 [P.O.:200; I.V.:440; IV Piggyback:464] Out: 2250 [Urine:1950; Drains:300] This shift:  Total I/O In: -  Out: 155 [Urine:125; Drains:30]  Bowel function:  Flatus: YES! In bag  BM: N  NGT with thin scant effluent  Physical Exam:  General: Pt awake/alert/oriented x4 in no acute distress Eyes: PERRL, normal EOM.  Sclera clear.  No icterus Neuro: CN II-XII intact w/o focal sensory/motor deficits. Lymph: No head/neck/groin lymphadenopathy Psych:  No delerium/psychosis/paranoia HENT: Normocephalic, Mucus membranes moist.  No thrush Neck: Supple, No tracheal deviation Chest: No chest wall pain w good excursion CV:  Pulses intact.  Regular rhythm Abdomen: Soft.  Nondistended.  Mildly tender at incisions only.  No incarcerated hernias.  Wound vac in place.  Ostomy +gas/ no stool Ext:  SCDs BLE.  No mjr edema.  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 male  12 Days Post-Op   Procedure(s): LAPAROSCOPIC LYSIS OF ADHESIONS EXPLORATORY LAPAROTOMY APPLICATION OF WOUND VAC  Postop ileus resolving  Intraabd collections, probable abscesses  Plan:  -f/u s/p CT guided drainage of fluid collection -NGT clamp trial w flatus seen.  Sips/ice chips 30mL q1h OK -wean vent as tolerated.   D/w w Dr Craige Cotta w Pulmon/CCM.  Prob extubation soon -wound vac -IV ABx -VTE prophylaxis- SCDs, etc -mobilize as tolerated to help recovery  Long term goals of care an issue.  Stay aggressive for now  Ardeth Sportsman, M.D., F.A.C.S. [facility] [address] [place], [place] [address] [phone] Main / Paging [phone] Voice Mail   11/22/2011  CARE TEAM:  PCP: No primary provider on file.  Outpatient Care Team: Patient Care Team: Almond Lint, MD as Consulting Physician (General Surgery) Ladene Artist, MD as Consulting Physician (Medical Oncology)  Inpatient Treatment Team: Treatment Team: Attending Provider: Almond Lint, MD; Registered Nurse: Domingo Mend Tai, RN; Technician: Calton Golds, NT; Registered Nurse: Tristan Schroeder, RN; Rounding Team: Md Pccm, MD; Registered Nurse: Jairo Ben, Student-RN; Attending Physician: Ladene Artist, MD; Respiratory Therapist: Liz Beach, RRT; Consulting Physician: Palliative Doristine Johns; Consulting Physician: Ardeth Sportsman, MD; Registered Nurse: Oda Kilts, RN; Technician: Roxy Manns, NT; Registered Nurse: Virl Cagey, RN; Respiratory Therapist: Renold Genta, RRT; Technician: Koleen Distance, NT; Respiratory Therapist: Antoine Poche, RRT   Results:   Labs: Results  for orders placed during the hospital encounter of 11/07/11 (from the past 48 hour(s))  GLUCOSE, CAPILLARY     Status: Abnormal   Collection Time   11/20/11 11:50 AM      Component Value Range Comment   Glucose-Capillary  136 (*) 70 - 99 mg/dL   GLUCOSE, CAPILLARY     Status: Abnormal   Collection Time   11/20/11  3:44 PM      Component Value Range Comment   Glucose-Capillary 102 (*) 70 - 99 mg/dL    Comment 1 Documented in Chart      Comment 2 Notify RN     GLUCOSE, CAPILLARY     Status: Abnormal   Collection Time   11/20/11  7:33 PM      Component Value Range Comment   Glucose-Capillary 114 (*) 70 - 99 mg/dL    Comment 1 Notify RN      Comment 2 Documented in Chart     GLUCOSE, CAPILLARY     Status: Abnormal   Collection Time   11/20/11 11:57 PM      Component Value Range Comment   Glucose-Capillary 106 (*) 70 - 99 mg/dL    Comment 1 Documented in Chart      Comment 2 Notify RN     CBC     Status: Abnormal   Collection Time   11/21/11  4:25 AM      Component Value Range Comment   WBC 16.5 (*) 4.0 - 10.5 K/uL    RBC 2.76 (*) 4.22 - 5.81 MIL/uL    Hemoglobin 7.9 (*) 13.0 - 17.0 g/dL    HCT 16.1 (*) 09.6 - 52.0 %    MCV 88.4  78.0 - 100.0 fL    MCH 28.6  26.0 - 34.0 pg    MCHC 32.4  30.0 - 36.0 g/dL    RDW 04.5 (*) 40.9 - 15.5 %    Platelets 641 (*) 150 - 400 K/uL   BASIC METABOLIC PANEL     Status: Abnormal   Collection Time   11/21/11  4:25 AM      Component Value Range Comment   Sodium 147 (*) 135 - 145 mEq/L    Potassium 4.2  3.5 - 5.1 mEq/L    Chloride 110  96 - 112 mEq/L    CO2 26  19 - 32 mEq/L    Glucose, Bld 118 (*) 70 - 99 mg/dL    BUN 24 (*) 6 - 23 mg/dL    Creatinine, Ser 8.11  0.50 - 1.35 mg/dL    Calcium 8.2 (*) 8.4 - 10.5 mg/dL    GFR calc non Af Amer >90  >90 mL/min    GFR calc Af Amer >90  >90 mL/min   MAGNESIUM     Status: Normal   Collection Time   11/21/11  4:25 AM      Component Value Range Comment   Magnesium 2.1  1.5 - 2.5 mg/dL   PHOSPHORUS     Status: Normal   Collection Time   11/21/11  4:25 AM      Component Value Range Comment   Phosphorus 3.8  2.3 - 4.6 mg/dL   BLOOD GAS, ARTERIAL     Status: Abnormal   Collection Time   11/21/11  4:38 AM      Component  Value Range Comment   FIO2 0.30      Delivery systems VENTILATOR      Mode PRESSURE REGULATED VOLUME CONTROL  VT 0.550      Rate 16.0      Peep/cpap 5.0      pH, Arterial 7.485 (*) 7.350 - 7.450    pCO2 arterial 34.4 (*) 35.0 - 45.0 mmHg    pO2, Arterial 94.1  80.0 - 100.0 mmHg    Bicarbonate 25.7 (*) 20.0 - 24.0 mEq/L    TCO2 23.7  0 - 100 mmol/L    Acid-Base Excess 2.7 (*) 0.0 - 2.0 mmol/L    O2 Saturation 97.3      Patient temperature 98.7      Collection site RIGHT RADIAL      Drawn by [phone]      Sample type ARTERIAL DRAW      Allens test (pass/fail) PASS  PASS   GLUCOSE, CAPILLARY     Status: Abnormal   Collection Time   11/21/11  4:43 AM      Component Value Range Comment   Glucose-Capillary 114 (*) 70 - 99 mg/dL    Comment 1 Documented in Chart      Comment 2 Notify RN     GLUCOSE, CAPILLARY     Status: Abnormal   Collection Time   11/21/11  7:34 AM      Component Value Range Comment   Glucose-Capillary 104 (*) 70 - 99 mg/dL   CULTURE, ROUTINE-ABSCESS     Status: Normal (Preliminary result)   Collection Time   11/21/11  9:52 AM      Component Value Range Comment   Specimen Description PERITONEAL CAVITY      Special Requests Normal      Gram Stain PENDING      Culture NO GROWTH 1 DAY      Report Status PENDING     GLUCOSE, CAPILLARY     Status: Abnormal   Collection Time   11/21/11 11:34 AM      Component Value Range Comment   Glucose-Capillary 115 (*) 70 - 99 mg/dL   GLUCOSE, CAPILLARY     Status: Abnormal   Collection Time   11/21/11  3:59 PM      Component Value Range Comment   Glucose-Capillary 110 (*) 70 - 99 mg/dL    Comment 1 Documented in Chart      Comment 2 Notify RN     GLUCOSE, CAPILLARY     Status: Abnormal   Collection Time   11/21/11  8:02 PM      Component Value Range Comment   Glucose-Capillary 109 (*) 70 - 99 mg/dL    Comment 1 Notify RN      Comment 2 Documented in Chart     GLUCOSE, CAPILLARY     Status: Normal   Collection Time    11/21/11 11:46 PM      Component Value Range Comment   Glucose-Capillary 96  70 - 99 mg/dL    Comment 1 Documented in Chart      Comment 2 Notify RN     COMPREHENSIVE METABOLIC PANEL     Status: Abnormal   Collection Time   11/22/11  3:30 AM      Component Value Range Comment   Sodium 143  135 - 145 mEq/L    Potassium 3.3 (*) 3.5 - 5.1 mEq/L DELTA CHECK NOTED   Chloride 105  96 - 112 mEq/L    CO2 25  19 - 32 mEq/L    Glucose, Bld 102 (*) 70 - 99 mg/dL    BUN 26 (*) 6 - 23 mg/dL  Creatinine, Ser 0.70  0.50 - 1.35 mg/dL    Calcium 8.3 (*) 8.4 - 10.5 mg/dL    Total Protein 7.9  6.0 - 8.3 g/dL    Albumin 1.8 (*) 3.5 - 5.2 g/dL    AST 32  0 - 37 U/L    ALT 24  0 - 53 U/L    Alkaline Phosphatase 61  39 - 117 U/L    Total Bilirubin 0.4  0.3 - 1.2 mg/dL    GFR calc non Af Amer >90  >90 mL/min    GFR calc Af Amer >90  >90 mL/min   CBC     Status: Abnormal   Collection Time   11/22/11  3:30 AM      Component Value Range Comment   WBC 11.5 (*) 4.0 - 10.5 K/uL    RBC 3.68 (*) 4.22 - 5.81 MIL/uL    Hemoglobin 10.3 (*) 13.0 - 17.0 g/dL    HCT 45.4 (*) 09.8 - 52.0 %    MCV 88.3  78.0 - 100.0 fL    MCH 28.0  26.0 - 34.0 pg    MCHC 31.7  30.0 - 36.0 g/dL    RDW 11.9 (*) 14.7 - 15.5 %    Platelets 647 (*) 150 - 400 K/uL   MAGNESIUM     Status: Normal   Collection Time   11/22/11  3:30 AM      Component Value Range Comment   Magnesium 2.0  1.5 - 2.5 mg/dL   PHOSPHORUS     Status: Normal   Collection Time   11/22/11  3:30 AM      Component Value Range Comment   Phosphorus 3.6  2.3 - 4.6 mg/dL   GLUCOSE, CAPILLARY     Status: Abnormal   Collection Time   11/22/11  3:39 AM      Component Value Range Comment   Glucose-Capillary 108 (*) 70 - 99 mg/dL   BLOOD GAS, ARTERIAL     Status: Abnormal   Collection Time   11/22/11  4:10 AM      Component Value Range Comment   FIO2 0.30      Delivery systems VENTILATOR      Mode PRESSURE REGULATED VOLUME CONTROL      VT 0.550      Rate 16       Peep/cpap 5.0      pH, Arterial 7.503 (*) 7.350 - 7.450    pCO2 arterial 33.7 (*) 35.0 - 45.0 mmHg    pO2, Arterial 101.0 (*) 80.0 - 100.0 mmHg    Bicarbonate 26.3 (*) 20.0 - 24.0 mEq/L    TCO2 24.6  0 - 100 mmol/L    Acid-Base Excess 3.4 (*) 0.0 - 2.0 mmol/L    O2 Saturation 98.0      Patient temperature 98.6      Collection site LEFT RADIAL      Drawn by  Robbie Lis, RRT, RCP      Sample type ARTERIAL DRAW      Allens test (pass/fail) PASS  PASS     Imaging / Studies: Ct Guided Abscess Drain  11/21/2011  *RADIOLOGY REPORT*  Clinical Data: Status post bowel resection surgery and history of metastatic rectal carcinoma.  The patient has developed peritoneal fluid collections now requiring drainage.  CT GUIDED DRAINAGE OF PERITONEAL ABSCESS  Sedation:  4.0 mg IV Versed;  200 mcg IV Fentanyl  Total Moderate Sedation Time: 15 minutes.  Procedure:  The procedure, risks,  benefits, and alternatives were explained to the patient.  Questions regarding the procedure were encouraged and answered. The patient understands and consents to the procedure.  The left lower quadrant abdominal wall was prepped with Betadine in a sterile fashion, and a sterile drape was applied covering the operative field.  A sterile gown and sterile gloves were used for the procedure. Local anesthesia was provided with 1% Lidocaine.  An 18 gauge needle was advanced into fluid within the left lower peritoneal cavity. Fluid was aspirated and a sample sent for culture analysis.  A guidewire was advanced.  The tract was dilated and a 12-French pigtail drainage catheter placed.  This catheter was connected to a suction bulb.  The catheter was secured at the skin with a Prolene retention suture.  Complications: None  Findings: Aspirated fluid was thin and amber in color.  A drain was placed in the left lower quadrant.  This does appear to ultimately communicate with another dominant pocket of fluid in the upper quadrant.  IMPRESSION: CT  guided drainage of the left lower quadrant peritoneal fluid collection fluid appears non purulent and thin in nature.  A sample was sent for culture analysis. A 12 French drainage catheter was placed and connected to a suction bulb.   Original Report Authenticated By: Reola Calkins, M.D.    Dg Chest Port 1 View  11/22/2011  *RADIOLOGY REPORT*  Clinical Data: Intubation, rectal carcinoma with pulmonary metastases, hypertension  PORTABLE CHEST - 1 VIEW  Comparison: Portable exam 0709 hours compared to 11/22/2011  Findings: Tip of endotracheal tube 3.3 cm above carina. Nasogastric tube into stomach. Right subclavian power port, tip projecting over SVC. Normal heart size, mediastinal contours, and pulmonary vascularity. Minimal bibasilar atelectasis. Lungs otherwise clear. 2.1 x 2.1 cm right perihilar nodule/metastasis again noted. Additional subtle nodular foci in right lung. No definite pleural effusion or pneumothorax. No acute osseous findings.  IMPRESSION: Minimal bibasilar atelectasis. Pulmonary metastases. Satisfactory line and tube positions.   Original Report Authenticated By: Lollie Marrow, M.D.    Dg Chest Port 1 View  11/22/2011  *RADIOLOGY REPORT*  Clinical Data: 51 year old male endotracheal tube placement. Metastatic adenocarcinoma.  Respiratory failure.  PORTABLE CHEST - 1 VIEW  Comparison: 11/21/2011 and earlier.  Findings: Portable semi upright AP view 0445 hours.  Endotracheal tube has been advanced.  Tip at the right mainstem bronchus. Enteric tube courses to the abdomen, tip not included.  Stable right chest Port-A-Cath and right PICC line.  Increased opacity at the left lung base.  Stable lung volumes overall.  Cardiac size and mediastinal contours are within normal limits.  No pneumothorax.  No large pleural effusion.  Bilateral pulmonary masses.  IMPRESSION: 1.  Right mainstem bronchus intubation.  Retract the endotracheal tube 4-5 cm for optimal placement. 2.  Increased left lung base  atelectasis. 3.  Pulmonary metastatic disease.  Study discussed by telephone with RN Verl Blalock on 11/22/2011 at 0545 hours.   Original Report Authenticated By: Harley Hallmark, M.D.    Dg Chest Port 1 View  11/21/2011  *RADIOLOGY REPORT*  Clinical Data: Endotracheal tube placement.  PORTABLE CHEST - 1 VIEW  Comparison: 11/20/2011  Findings: Endotracheal tube is in place with tip approximate 3.6 cm above carina.  Nasogastric tube is in place with tip off the film but beyond the gastroesophageal junction.  Right-sided power port tip overlies the level of the upper right atrium.  The  Heart size is normal.  There is mild interstitial edema versus  pulmonary infiltrates.  IMPRESSION:  1.  Endotracheal tube as described. 2.  Pulmonary infiltrates versus edema.   Original Report Authenticated By: Patterson Hammersmith, M.D.     Medications / Allergies: per chart  Antibiotics: Anti-infectives     Start     Dose/Rate Route Frequency Ordered Stop   11/17/11 1400   vancomycin (VANCOCIN) IVPB 1000 mg/200 mL premix  Status:  Discontinued        1,000 mg 200 mL/hr over 60 Minutes Intravenous Every 8 hours 11/17/11 0802 11/19/11 0934   11/15/11 1400  imipenem-cilastatin (PRIMAXIN) 500 mg in sodium chloride 0.9 % 100 mL IVPB       500 mg 200 mL/hr over 30 Minutes Intravenous 3 times per day 11/15/11 0931     11/15/11 0615   vancomycin (VANCOCIN) 750 mg in sodium chloride 0.9 % 150 mL IVPB  Status:  Discontinued        750 mg 150 mL/hr over 60 Minutes Intravenous 3 times per day 11/15/11 0608 11/17/11 0801   11/14/11 1800   imipenem-cilastatin (PRIMAXIN) 250 mg in sodium chloride 0.9 % 100 mL IVPB  Status:  Discontinued        250 mg 200 mL/hr over 30 Minutes Intravenous 4 times per day 11/14/11 1434 11/15/11 0931   11/13/11 1800   vancomycin (VANCOCIN) 750 mg in sodium chloride 0.9 % 150 mL IVPB  Status:  Discontinued        750 mg 150 mL/hr over 60 Minutes Intravenous Every 12 hours 11/13/11 0631  11/15/11 0608   11/13/11 0645   vancomycin (VANCOCIN) 750 mg in sodium chloride 0.9 % 150 mL IVPB        750 mg 150 mL/hr over 60 Minutes Intravenous  Once 11/13/11 0631 11/13/11 0738   11/12/11 1200   imipenem-cilastatin (PRIMAXIN) 500 mg in sodium chloride 0.9 % 100 mL IVPB  Status:  Discontinued        500 mg 200 mL/hr over 30 Minutes Intravenous 3 times per day 11/12/11 1015 11/14/11 1434   11/11/11 2000   micafungin (MYCAMINE) 100 mg in sodium chloride 0.9 % 100 mL IVPB     Comments: stat      100 mg 100 mL/hr over 1 Hours Intravenous Every 24 hours 11/10/11 1939     11/11/11 1800   vancomycin (VANCOCIN) 500 mg in sodium chloride 0.9 % 100 mL IVPB  Status:  Discontinued        500 mg 100 mL/hr over 60 Minutes Intravenous Every 12 hours 11/11/11 1014 11/13/11 0631   11/10/11 2000   micafungin (MYCAMINE) 100 mg in sodium chloride 0.9 % 100 mL IVPB     Comments: stat      100 mg 100 mL/hr over 1 Hours Intravenous Daily 11/10/11 1939 11/10/11 2215   11/10/11 2000   imipenem-cilastatin (PRIMAXIN) 250 mg in sodium chloride 0.9 % 100 mL IVPB  Status:  Discontinued        250 mg 200 mL/hr over 30 Minutes Intravenous 4 times per day 11/10/11 1951 11/12/11 1014   11/10/11 1630   fluconazole (DIFLUCAN) IVPB 200 mg  Status:  Discontinued        200 mg 100 mL/hr over 60 Minutes Intravenous Every 24 hours 11/10/11 1617 11/10/11 1939   11/10/11 1630   vancomycin (VANCOCIN) 500 mg in sodium chloride 0.9 % 100 mL IVPB  Status:  Discontinued        500 mg 100 mL/hr over 60 Minutes Intravenous 3  times per day 11/10/11 1617 11/11/11 1014   11/10/11 1630   piperacillin-tazobactam (ZOSYN) IVPB 3.375 g  Status:  Discontinued        3.375 g 12.5 mL/hr over 240 Minutes Intravenous 3 times per day 11/10/11 1617 11/10/11 1941   11/10/11 0933   cefOXitin (MEFOXIN) 2 g in dextrose 5 % 50 mL IVPB        2 g 100 mL/hr over 30 Minutes Intravenous 60 min pre-op 11/10/11 0926 11/10/11 1105   11/10/11  0930   cefOXitin (MEFOXIN) 1 g in dextrose 5 % 50 mL IVPB  Status:  Discontinued        1 g 100 mL/hr over 30 Minutes Intravenous 3 times per day 11/10/11 0924 11/10/11 0926   11/07/11 2200   cefTRIAXone (ROCEPHIN) 1 g in dextrose 5 % 50 mL IVPB  Status:  Discontinued        1 g 100 mL/hr over 30 Minutes Intravenous Every 24 hours 11/07/11 2124 11/10/11 1536   11/07/11 1830   cefTRIAXone (ROCEPHIN) 1 g in dextrose 5 % 50 mL IVPB        1 g 100 mL/hr over 30 Minutes Intravenous  Once 11/07/11 1819 11/07/11 1920

## 2011-11-22 NOTE — Procedures (Signed)
Extubation Procedure Note  Patient Details:   Name: NORLAN RANN DOB: [birth year] MRN: [id number]   Airway Documentation:  AIRWAYS 8 mm (Active)  Secured at (cm) 21 cm 11/10/2011 10:57 AM  Measured From Lips 11/10/2011 10:57 AM  Secured Location Right 11/10/2011 10:57 AM  Secured By Caron Presume Tape 11/10/2011 10:57 AM    Evaluation  O2 sats: stable throughout Complications: No apparent complications Patient did tolerate procedure well. Bilateral Breath Sounds: Diminished Suctioning: Oral;Airway Yes  Jennice Renegar S 11/22/2011, 10:08 AM

## 2011-11-22 NOTE — Progress Notes (Signed)
ANTIBIOTIC CONSULT NOTE - FOLLOW UP  Pharmacy Consult for Primaxin Indication: peritonitis, PNA, buttock and pelvic abscess  No Known Allergies  Patient Measurements: Height: 6\' 2"  (188 cm) Weight: 113 lb 5.1 oz (51.4 kg) [name] : 82.2   Vital Signs: Temp: 99.3 F (37.4 C) (10/07 1200) Temp src: Oral (10/07 1200) BP: 152/95 mmHg (10/07 1000) Pulse Rate: 112  (10/07 1000) Intake/Output from previous day: 10/06 0701 - 10/07 0700 In: 1769 [P.O.:200; I.V.:460; IV Piggyback:464] Out: 2250 [Urine:1950; Drains:300] Intake/Output from this shift: Total I/O In: 104 [I.V.:100; IV Piggyback:4] Out: 805 [Urine:725; Drains:80]  Labs:  [facility] 11/22/11 0330 11/21/11 0425 11/20/11 0425  WBC 11.5* 16.5* 17.6*  HGB 10.3* 7.9* 7.6*  PLT 647* 641* 516*  LABCREA -- -- --  CREATININE 0.70 0.72 0.68   Estimated Creatinine Clearance: 78.5 ml/min (by C-G formula based on Cr of 0.7). No results found for this basename: VANCOTROUGH:2,VANCOPEAK:2,VANCORANDOM:2,GENTTROUGH:2,GENTPEAK:2,GENTRANDOM:2,TOBRATROUGH:2,TOBRAPEAK:2,TOBRARND:2,AMIKACINPEAK:2,AMIKACINTROU:2,AMIKACIN:2, in the last 72 hours   Assessment:  60 yom with h/o recurrent metastatic rectal cancer underwent exploratory laparotomy, small bowel resection x2, incidental appendectomy 9/25 - thought to have aspirated in PACU post operatively with feculent emesis reported. Abx started for peritonitis, aspiration PNA and new pelvic and buttock abscess s/p perc drainage of peritoneal abscess/fluid by IR on 10/6.   Today is D#13 Primaxin, Micafungin.  Afebrile, WBC improving 11.5K, Scr wnl, CrCl > 100 ml/min.  PCT improved 10/1.  Pending peritoneal fluid culture.   Plan:   Given weight of 51 kg, will change Primaxin to 250 mg Iv q6h.  Pharmacy will f/u  Geoffry Paradise Thi 11/22/2011,2:15 PM

## 2011-11-22 NOTE — Progress Notes (Signed)
PULMONARY / CCM CONSULT NOTE  Requesting MD/Service: Byerly/CCS Date of admission: 9/22 Date of consult: 9/25 Reason for consultation: post op VDRF  Pt Profile:  51 yobm with history of recurrent metastatic rectal cancer underwent exploratory laparotomy, small bowel resection x2, incidental appendectomy 9/25. Was thought to have aspirated in PACU post operatively with feculent emesis reported. Required reintubation in PACU. PCCM asked to assist with vent/CCM issues.  Lines, Tubes, etc: ETT 9/25 >> 9/27>>>9/28>>>10/07 RUE PICC 9/24 >>  R [place] Portacath (longstanding)   Microbiology: Urine 9/22 >> Klebs (pansens) 10/1 blood >> 10/1 resp >>negative 10/1 urine >>negative 10/2 wound >> Coag neg staph 10/6 Peritoneal cavity>>  Antibiotics:  Fluconazole 9/25 >> 9/25 Zosyn 9/25 >> 9/25 Vanc 9/25>>>10/4 Micafungin 9/26>>> Imipenem 9/25>>>  Studies/Events: 10/4 CT Abd >>2 large abscesses in abd and pelvis 10/5 CT guided drainage of Lt lower quad abscess  Consults IR  Best Practice: DVT: scd SUP: PPI Nutrition: NPO Glycemic control: SSI   SUBJECTIVE: Did well with SBT.    BP 142/91  Pulse 102  Temp 99.4 F (37.4 C) (Oral)  Resp 16  Ht 6\' 2"  (1.88 m)  Wt 113 lb 5.1 oz (51.4 kg)  BMI 14.55 kg/m2  SpO2 100%   EXAM:  General - no distress HEENT - NG and ETT in place Cardiac - s1s2 tachycardic, no murmur Chest - scattered rhonchi Abd - soft, mild tenderness Ext - no edema Neuro - normal strength Psych - normal mood, behavior   Ct Guided Abscess Drain  11/21/2011  *RADIOLOGY REPORT*  Clinical Data: Status post bowel resection surgery and history of metastatic rectal carcinoma.  The patient has developed peritoneal fluid collections now requiring drainage.  CT GUIDED DRAINAGE OF PERITONEAL ABSCESS  Sedation:  4.0 mg IV Versed;  200 mcg IV Fentanyl  Total Moderate Sedation Time: 15 minutes.  Procedure:  The procedure, risks, benefits, and alternatives were explained to  the patient.  Questions regarding the procedure were encouraged and answered. The patient understands and consents to the procedure.  The left lower quadrant abdominal wall was prepped with Betadine in a sterile fashion, and a sterile drape was applied covering the operative field.  A sterile gown and sterile gloves were used for the procedure. Local anesthesia was provided with 1% Lidocaine.  An 18 gauge needle was advanced into fluid within the left lower peritoneal cavity. Fluid was aspirated and a sample sent for culture analysis.  A guidewire was advanced.  The tract was dilated and a 12-French pigtail drainage catheter placed.  This catheter was connected to a suction bulb.  The catheter was secured at the skin with a Prolene retention suture.  Complications: None  Findings: Aspirated fluid was thin and amber in color.  A drain was placed in the left lower quadrant.  This does appear to ultimately communicate with another dominant pocket of fluid in the upper quadrant.  IMPRESSION: CT guided drainage of the left lower quadrant peritoneal fluid collection fluid appears non purulent and thin in nature.  A sample was sent for culture analysis. A 12 French drainage catheter was placed and connected to a suction bulb.   Original Report Authenticated By: Reola Calkins, M.D.    Dg Chest Port 1 View  11/22/2011  *RADIOLOGY REPORT*  Clinical Data: Intubation, rectal carcinoma with pulmonary metastases, hypertension  PORTABLE CHEST - 1 VIEW  Comparison: Portable exam 0709 hours compared to 11/22/2011  Findings: Tip of endotracheal tube 3.3 cm above carina. Nasogastric tube into  stomach. Right subclavian power port, tip projecting over SVC. Normal heart size, mediastinal contours, and pulmonary vascularity. Minimal bibasilar atelectasis. Lungs otherwise clear. 2.1 x 2.1 cm right perihilar nodule/metastasis again noted. Additional subtle nodular foci in right lung. No definite pleural effusion or pneumothorax. No  acute osseous findings.  IMPRESSION: Minimal bibasilar atelectasis. Pulmonary metastases. Satisfactory line and tube positions.   Original Report Authenticated By: Lollie Marrow, M.D.    Dg Chest Port 1 View  11/22/2011  *RADIOLOGY REPORT*  Clinical Data: 51 year old male endotracheal tube placement. Metastatic adenocarcinoma.  Respiratory failure.  PORTABLE CHEST - 1 VIEW  Comparison: 11/21/2011 and earlier.  Findings: Portable semi upright AP view 0445 hours.  Endotracheal tube has been advanced.  Tip at the right mainstem bronchus. Enteric tube courses to the abdomen, tip not included.  Stable right chest Port-A-Cath and right PICC line.  Increased opacity at the left lung base.  Stable lung volumes overall.  Cardiac size and mediastinal contours are within normal limits.  No pneumothorax.  No large pleural effusion.  Bilateral pulmonary masses.  IMPRESSION: 1.  Right mainstem bronchus intubation.  Retract the endotracheal tube 4-5 cm for optimal placement. 2.  Increased left lung base atelectasis. 3.  Pulmonary metastatic disease.  Study discussed by telephone with RN Verl Blalock on 11/22/2011 at 0545 hours.   Original Report Authenticated By: Harley Hallmark, M.D.    Dg Chest Port 1 View  11/21/2011  *RADIOLOGY REPORT*  Clinical Data: Endotracheal tube placement.  PORTABLE CHEST - 1 VIEW  Comparison: 11/20/2011  Findings: Endotracheal tube is in place with tip approximate 3.6 cm above carina.  Nasogastric tube is in place with tip off the film but beyond the gastroesophageal junction.  Right-sided power port tip overlies the level of the upper right atrium.  The  Heart size is normal.  There is mild interstitial edema versus pulmonary infiltrates.  IMPRESSION:  1.  Endotracheal tube as described. 2.  Pulmonary infiltrates versus edema.   Original Report Authenticated By: Patterson Hammersmith, M.D.     Lab Results  Component Value Date   CREATININE 0.70 11/22/2011   BUN 26* 11/22/2011   NA 143 11/22/2011    K 3.3* 11/22/2011   CL 105 11/22/2011   CO2 25 11/22/2011   Lab Results  Component Value Date   WBC 11.5* 11/22/2011   HGB 10.3* 11/22/2011   HCT 32.5* 11/22/2011   MCV 88.3 11/22/2011   PLT 647* 11/22/2011   Lab Results  Component Value Date   ALT 24 11/22/2011   AST 32 11/22/2011   ALKPHOS 61 11/22/2011   BILITOT 0.4 11/22/2011     IMPRESSION:    Acute respiratory failure likely 2nd to Asp PNA and pulmonary edema>>much improved 10/07.  Continued tobacco abuse prior to admission. P: Proceed with extubation F/u CXR as needed Titrate oxygen to keep SpO2 > 92% Xopenex prn Keep in even to negative fluid balance  Septic shock from PNA, buttock abscess and pelvic abscesses s/p drain by IR 10/6. P: Continue Imipenem adn micafungin for now F/u peritoneal fluid cultures and then narrow abx  Closed loop obstruction s/p laparotomy 9/25. P: Post-op care and nutrition per surgery  Metastatic rectal cancer P: Will need f/u with Dr. Truett Perna  HTN P: Continue IV lopressor  CBG (last 3)   Basename 11/22/11 0807 11/22/11 0339 11/21/11 2346  GLUCAP 104* 108* 96  Hyperglycemia P: SSI  Hypokalemia from diuresis P: F/u and replace electrolytes as needed  Anemia  of critical illness and chronic disease. P: F/u CBC Transfuse for Hb < 7  Goals of Care. P: No CPR, no pressors  Critical care time 35 minutes.  Coralyn Helling, MD [facility] Pulmonary/Critical Care 11/22/2011, 10:10 AM Pager:  [phone] After 3pm call: [phone]

## 2011-11-22 NOTE — Progress Notes (Signed)
eLink Physician-Brief Progress Note Patient Name: Jeffery [name] DOB: [birth year] MRN: [id number]  Date of Service  11/22/2011   HPI/Events of Note   Hypokalemia  eICU Interventions  Potassium replaced   Intervention Category Intermediate Interventions: Electrolyte abnormality - evaluation and management  Jeffery [name] 11/22/2011, 5:09 AM

## 2011-11-22 NOTE — Progress Notes (Signed)
CARE MANAGEMENT NOTE 11/22/2011  Patient:  Jeffery [name], Jeffery [name]   Account Number:  [id number]  Date Initiated:  11/08/2011  Documentation initiated by:  Lorenda Ishihara  Subjective/Objective Assessment:   51 yo male admitted with SBO. Prior history of SBO, rectal cancer with colon resection and colostomy. PTA lived at home with family.     Action/Plan:   HOME   Anticipated DC Date:  11/25/2011   Anticipated DC Plan:  HOME/SELF CARE  In-house referral  Clinical Social Worker      DC Planning Services  CM consult      [facility] Choice  NA   Choice offered to / List presented to:  NA   DME arranged  NA      DME agency  NA     HH arranged  NA      HH agency  NA   Status of service:  In process, will continue to follow Medicare Important Message given?  NA - LOS <3 / Initial given by admissions (If response is "NO", the following Medicare IM given date fields will be blank) Date Medicare IM given:   Date Additional Medicare IM given:    Discharge Disposition:    Per UR Regulation:  Reviewed for med. necessity/level of care/duration of stay  If discussed at Long Length of Stay Meetings, dates discussed:   11/02/2011  11/04/2011  11/08/2011  11/11/2011  11/16/2011  11/18/2011    Comments:  [id number] Earlene Plater, RN, BSN, CCM: Patient extubated this am, tolerated well, fld in abd remains [name] concern last ir drainage on [id number] remains elevated, on iv anti-hypoertensives .  [id number] Earlene Plater, RN, BSN, CCM: CHART REVIEWED AND UPDATED. NO DISCHARGE NEEDS PRESENT AT THIS TIME./wbc increased from 13-21, pt failed the am weaning trail after 4 minutes. CASE MANAGEMENT [phone]    [id number]/Tavin Vernet,Rn,BSN,CCM: patient unresponsive and reintubated x3.  Terminal wean planned for Wednesday Oct. 2,2013.  [id number] Earlene Plater, RN, BSN, CCM: CHART REVIEWED AND UPDATED. NO DISCHARGE NEEDS PRESENT AT THIS TIME. CASE MANAGEMENT [phone]

## 2011-11-22 NOTE — Progress Notes (Signed)
Order instructions received to pull back ETT 4 cm post CXR. Upon assessment of ETT, it is noted to have not moved and continues to be placed 22cm at the lip. Spoke with Dr Darrick Penna, who agrees patient should be repositioned in the bed and a repeat CXR ordered. RN aware.

## 2011-11-23 DIAGNOSIS — E46 Unspecified protein-calorie malnutrition: Secondary | ICD-10-CM

## 2011-11-23 DIAGNOSIS — I10 Essential (primary) hypertension: Secondary | ICD-10-CM

## 2011-11-23 LAB — GLUCOSE, CAPILLARY
Glucose-Capillary: 104 mg/dL — ABNORMAL HIGH (ref 70–99)
Glucose-Capillary: 171 mg/dL — ABNORMAL HIGH (ref 70–99)
Glucose-Capillary: 96 mg/dL (ref 70–99)

## 2011-11-23 LAB — COMPREHENSIVE METABOLIC PANEL
BUN: 21 mg/dL (ref 6–23)
Calcium: 7.6 mg/dL — ABNORMAL LOW (ref 8.4–10.5)
GFR calc Af Amer: >90 mL/min (ref >90)
Glucose, Bld: 101 mg/dL — ABNORMAL HIGH (ref 70–99)
Sodium: 136 mEq/L (ref 135–145)
Total Protein: 7.5 g/dL (ref 6.0–8.3)

## 2011-11-23 MED ORDER — AMLODIPINE BESYLATE 5 MG PO TABS
5.0000 mg | ORAL_TABLET | Freq: Every day | ORAL | Status: DC
  Administered 2011-11-24 – 2011-12-02 (×8): 5 mg via ORAL
  Filled 2011-11-23 (×10): qty 1

## 2011-11-23 MED ORDER — PANTOPRAZOLE SODIUM 40 MG PO TBEC
40.0000 mg | DELAYED_RELEASE_TABLET | Freq: Every day | ORAL | Status: DC
  Administered 2011-11-24 – 2011-12-02 (×8): 40 mg via ORAL
  Filled 2011-11-23 (×9): qty 1

## 2011-11-23 MED ORDER — METOPROLOL TARTRATE 12.5 MG HALF TABLET
12.5000 mg | ORAL_TABLET | Freq: Two times a day (BID) | ORAL | Status: DC
  Filled 2011-11-23 (×2): qty 1

## 2011-11-23 MED ORDER — LACTATED RINGERS IV BOLUS (SEPSIS): 1000.0000 mL | Freq: Three times a day (TID) | INTRAVENOUS | Status: AC | PRN

## 2011-11-23 MED ORDER — SODIUM CHLORIDE 0.9 % IJ SOLN
3.0000 mL | Freq: Two times a day (BID) | INTRAMUSCULAR | Status: DC
  Administered 2011-11-23: 3 mL via INTRAVENOUS

## 2011-11-23 MED ORDER — METOPROLOL TARTRATE 1 MG/ML IV SOLN
2.5000 mg | INTRAVENOUS | Status: DC | PRN
Start: 2011-11-23 — End: 2011-12-01
  Filled 2011-11-23: qty 5

## 2011-11-23 MED ORDER — SODIUM CHLORIDE 0.9 % IJ SOLN: 3.0000 mL | INTRAMUSCULAR | Status: DC | PRN

## 2011-11-23 MED ORDER — LIDOCAINE-PRILOCAINE 2.5-2.5 % EX CREA
TOPICAL_CREAM | CUTANEOUS | Status: DC | PRN
  Filled 2011-11-23: qty 5

## 2011-11-23 MED ORDER — POTASSIUM CHLORIDE 10 MEQ/50ML IV SOLN
10.0000 meq | INTRAVENOUS | Status: AC
  Administered 2011-11-23 (×4): 10 meq via INTRAVENOUS
  Filled 2011-11-23 (×4): qty 50

## 2011-11-23 NOTE — Progress Notes (Signed)
Nutrition Follow-up  Intervention:  1. Recommend diet advancements as medically able.  2. Recommend once diet advanced order patient Ensure nutrition supplement TID to provide 750 kcal and 27 grams protein.  3. RD to follow for nutrition plan of care.   Assessment:   Patient was extubated yesterday, now off TF. Per MD pt with + gas but no stool. Patient is currently NPO with ice per surgery. He reported he is hungry. He reported he drinks Ensure at home.   Diet Order:  NPO  Meds: Scheduled Meds:   . amLODipine  5 mg Oral Daily  . antiseptic oral rinse  15 mL Mouth Rinse QID  . chlorhexidine  15 mL Mouth Rinse BID  . imipenem-cilastatin  250 mg Intravenous Q6H  . imipenem-cilastatin  250 mg Intravenous NOW  . insulin aspart  0-15 Units Subcutaneous Q4H  . micafungin (MYCAMINE) IV  100 mg Intravenous Q24H  . pantoprazole  40 mg Oral Daily  . potassium chloride  10 mEq Intravenous Q1 Hr x 4  . sodium chloride  3 mL Intravenous Q12H  . DISCONTD: furosemide  40 mg Intravenous BID  . DISCONTD: imipenem-cilastatin  500 mg Intravenous Q8H  . DISCONTD: metoprolol  5 mg Intravenous Q6H  . DISCONTD: metoprolol tartrate  12.5 mg Oral BID  . DISCONTD: pantoprazole (PROTONIX) IV  40 mg Intravenous QHS   Continuous Infusions:   . DISCONTD: dextrose 20 mL/hr at 11/21/11 1500   PRN Meds:.acetaminophen, fentaNYL, hydrALAZINE, lactated ringers, levalbuterol, lidocaine-prilocaine, metoprolol, ondansetron, pneumococcal 23 valent vaccine, sodium chloride, sodium chloride, sodium chloride, DISCONTD: metoprolol, DISCONTD: ondansetron  Labs:  CMP     Component Value Date/Time   NA 136 11/23/2011 0530   K 3.1* 11/23/2011 0530   CL 99 11/23/2011 0530   CO2 26 11/23/2011 0530   GLUCOSE 101* 11/23/2011 0530   BUN 21 11/23/2011 0530   CREATININE 0.61 11/23/2011 0530   CALCIUM 7.6* 11/23/2011 0530   PROT 7.5 11/23/2011 0530   ALBUMIN 1.8* 11/23/2011 0530   AST 28 11/23/2011 0530   ALT 24 11/23/2011 0530   ALKPHOS 63 11/23/2011 0530   BILITOT 0.4 11/23/2011 0530   GFRNONAA >90 11/23/2011 0530   GFRAA >90 11/23/2011 0530     Intake/Output Summary (Last 24 hours) at 11/23/11 1323 Last data filed at 11/23/11 1108  Gross per 24 hour  Intake   1384 ml  Output   3425 ml  Net  -2041 ml    Weight Status:  51.4 kg weight is down slightly.   Re-estimated needs:  2159-2459 kcal, 95-112 grams protein  Nutrition Dx:  Inadequate Oral Intake, Ongoing.   Goal:  1. Positive tolerance of TF. -d/c, now off tf.  2. Promote weight maintenance.  3. Diet advancements as medically able.   Monitor:  Diet advancements/ tolerance, weights, labs   [name] [phone]

## 2011-11-23 NOTE — Progress Notes (Signed)
Jeffery [name] [id number] Jul 08, 1960   Subjective:  Pt denies much pain Extubated Still with flatus Tolerating ice chips RN Dondra Spry) & family in room  Objective:  Vital signs:  Filed Vitals:   11/23/11 0600 11/23/11 0700 11/23/11 0800 11/23/11 0850  BP: 122/79 136/87 138/89   Pulse: 96 98 102 103  Temp:   98.1 F (36.7 C)   TempSrc:   Oral   Resp: 27 25 29 27   Height:      Weight:      SpO2: 97% 95% 97% 96%    Last BM Date:  (Colostomy)  Intake/Output   Yesterday:  10/07 0701 - 10/08 0700 In: 1028 [I.V.:480; IV Piggyback:308] Out: 2930 [Urine:2680; Drains:250] This shift:  Total I/O In: 140 [I.V.:20; Other:20; IV Piggyback:100] Out: 150 [Urine:150]  Bowel function:  Flatus: YES In bag  BM: N  NGT clamped  Physical Exam:  General: Pt awake/alert/oriented x4 in no acute distress Eyes: PERRL, normal EOM.  Sclera clear.  No icterus Neuro: CN II-XII intact w/o focal sensory/motor deficits. Lymph: No head/neck/groin lymphadenopathy Psych:  No delerium/psychosis/paranoia HENT: Normocephalic, Mucus membranes moist.  No thrush Neck: Supple, No tracheal deviation Chest: No chest wall pain w good excursion CV:  Pulses intact.  Regular rhythm Abdomen: Soft.  Mild/mod distended - unchanged.  Mildly tender at incisions only.  No incarcerated hernias.  Wound vac in place.  Ostomy +gas/ no stool Ext:  SCDs BLE.  No mjr edema.  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 male  13 Days Post-Op  Procedure(s): LAPAROSCOPIC LYSIS OF ADHESIONS EXPLORATORY LAPAROTOMY APPLICATION OF WOUND VAC  Postop ileus resolving  Intraabd collections, probable abscesses  Plan:  -f/u s/p CT guided drainage of fluid collection -NGT d/c'd by me.  Sips/ice chips 30mL q1h OK -Transfer to floor.   D/w w Dr Craige Cotta w Pulmon/CCM.   -wound vac -IV ABx - f/u Cx's -HTN - restart amlodipine -PPI for GERD -ostomy care -VTE prophylaxis- SCDs, etc -mobilize as tolerated to help recovery.  PT/OT  Long term goals of care an issue.  Stay aggressive for now  Ardeth Sportsman, M.D., F.A.C.S. Gastrointestinal and Minimally Invasive Surgery [facility] [address], [address] [place], [place] [address] [phone] Main / Paging [phone] Voice Mail   11/23/2011  CARE TEAM:  PCP: No primary provider on file.  Outpatient Care Team: Patient Care Team: Almond Lint, MD as Consulting Physician (General Surgery) Ladene Artist, MD as Consulting Physician (Medical Oncology)  Inpatient Treatment Team: Treatment Team: Attending Provider: Almond Lint, MD; Registered Nurse: Domingo Mend Tai, RN; Technician: Calton Golds, NT; Registered Nurse: Tristan Schroeder, RN; Registered Nurse: Jairo Ben, Student-RN; Attending Physician: Ladene Artist, MD; Respiratory Therapist: Liz Beach, RRT; Consulting Physician: Palliative Doristine Johns; Consulting Physician: Ardeth Sportsman, MD; Registered Nurse: Oda Kilts, RN; Technician: Roxy Manns, NT; Respiratory Therapist: Renold Genta, RRT; Respiratory Therapist: Antoine Poche, RRT; Technician: Gwenlyn Perking [place],  NT   Results:   Labs: Results for orders placed during the hospital encounter of 11/07/11 (from the past 48 hour(s))  GLUCOSE, CAPILLARY     Status: Abnormal   Collection Time   11/21/11 11:34 AM      Component Value  Range Comment   Glucose-Capillary 115 (*) 70 - 99 mg/dL   GLUCOSE, CAPILLARY     Status: Abnormal   Collection Time   11/21/11  3:59 PM      Component Value Range Comment   Glucose-Capillary 110 (*) 70 - 99 mg/dL    Comment 1 Documented in Chart      Comment 2 Notify RN     GLUCOSE, CAPILLARY     Status: Abnormal   Collection Time   11/21/11  8:02 PM      Component Value Range Comment   Glucose-Capillary 109 (*) 70 - 99 mg/dL    Comment 1 Notify RN      Comment 2 Documented in Chart     GLUCOSE, CAPILLARY     Status: Normal   Collection Time   11/21/11 11:46 PM      Component Value Range Comment   Glucose-Capillary 96  70 - 99 mg/dL    Comment 1 Documented in Chart      Comment 2 Notify RN     COMPREHENSIVE METABOLIC PANEL     Status: Abnormal   Collection Time   11/22/11  3:30 AM      Component Value Range Comment   Sodium 143  135 - 145 mEq/L    Potassium 3.3 (*) 3.5 - 5.1 mEq/L DELTA CHECK NOTED   Chloride 105  96 - 112 mEq/L    CO2 25  19 - 32 mEq/L    Glucose, Bld 102 (*) 70 - 99 mg/dL    BUN 26 (*) 6 - 23 mg/dL    Creatinine, Ser 3.08  0.50 - 1.35 mg/dL    Calcium 8.3 (*) 8.4 - 10.5 mg/dL    Total Protein 7.9  6.0 - 8.3 g/dL    Albumin 1.8 (*) 3.5 - 5.2 g/dL    AST 32  0 - 37 U/L    ALT 24  0 - 53 U/L    Alkaline Phosphatase 61  39 - 117 U/L    Total Bilirubin 0.4  0.3 - 1.2 mg/dL    GFR calc non Af Amer >90  >90 mL/min    GFR calc Af Amer >90  >90 mL/min   CBC     Status: Abnormal   Collection Time   11/22/11  3:30 AM      Component Value Range Comment   WBC 11.5 (*) 4.0 - 10.5 K/uL    RBC 3.68 (*) 4.22 - 5.81 MIL/uL    Hemoglobin 10.3 (*) 13.0 - 17.0 g/dL    HCT 65.7 (*) 84.6 - 52.0 %    MCV 88.3  78.0 - 100.0 fL    MCH 28.0  26.0 - 34.0 pg    MCHC 31.7  30.0 - 36.0 g/dL    RDW 96.2 (*) 95.2 - 15.5 %    Platelets 647 (*) 150 - 400 K/uL   MAGNESIUM     Status: Normal   Collection Time   11/22/11  3:30 AM      Component Value Range Comment   Magnesium 2.0   1.5 - 2.5 mg/dL   PHOSPHORUS     Status: Normal   Collection Time   11/22/11  3:30 AM      Component Value Range Comment   Phosphorus 3.6  2.3 - 4.6 mg/dL   GLUCOSE, CAPILLARY     Status: Abnormal   Collection Time   11/22/11  3:39 AM      Component Value Range Comment   Glucose-Capillary 108 (*) 70 - 99 mg/dL   BLOOD GAS, ARTERIAL     Status: Abnormal   Collection Time   11/22/11  4:10 AM      Component Value Range Comment   FIO2 0.30      Delivery systems VENTILATOR      Mode PRESSURE REGULATED VOLUME CONTROL      VT 0.550      Rate 16      Peep/cpap 5.0      pH, Arterial 7.503 (*) 7.350 - 7.450    pCO2 arterial 33.7 (*) 35.0 - 45.0 mmHg    pO2, Arterial 101.0 (*) 80.0 - 100.0 mmHg    Bicarbonate 26.3 (*) 20.0 - 24.0 mEq/L    TCO2 24.6  0 - 100 mmol/L    Acid-Base Excess 3.4 (*) 0.0 - 2.0 mmol/L    O2 Saturation 98.0      Patient temperature 98.6      Collection site LEFT RADIAL      Drawn by  Robbie Lis, RRT, RCP      Sample type ARTERIAL DRAW      Allens test (pass/fail) PASS  PASS   GLUCOSE, CAPILLARY     Status: Abnormal   Collection Time   11/22/11  8:07 AM      Component Value Range Comment   Glucose-Capillary 104 (*) 70 - 99 mg/dL    Comment 1 Documented in Chart      Comment 2 Notify RN     GLUCOSE, CAPILLARY     Status: Abnormal   Collection Time   11/22/11 11:58 AM      Component Value Range Comment   Glucose-Capillary 122 (*) 70 - 99 mg/dL    Comment 1 Documented in Chart      Comment 2 Notify RN     GLUCOSE, CAPILLARY     Status: Normal   Collection Time   11/22/11  4:06 PM      Component Value Range Comment   Glucose-Capillary 98  70 - 99 mg/dL    Comment 1 Documented in Chart      Comment 2 Notify RN     GLUCOSE, CAPILLARY     Status: Normal   Collection Time   11/22/11  8:17 PM      Component Value Range Comment   Glucose-Capillary 99  70 - 99 mg/dL    Comment 1 Documented in Chart      Comment 2 Notify RN     GLUCOSE, CAPILLARY     Status:  Normal   Collection Time   11/22/11 11:34 PM      Component Value Range Comment   Glucose-Capillary 96  70 - 99 mg/dL    Comment 1 Notify RN     GLUCOSE, CAPILLARY     Status: Normal   Collection Time   11/23/11  3:32 AM      Component Value Range Comment   Glucose-Capillary 93  70 - 99 mg/dL    Comment 1 Notify RN     COMPREHENSIVE METABOLIC PANEL     Status: Abnormal   Collection Time   11/23/11  5:30 AM      Component Value  Range Comment   Sodium 136  135 - 145 mEq/L    Potassium 3.1 (*) 3.5 - 5.1 mEq/L    Chloride 99  96 - 112 mEq/L    CO2 26  19 - 32 mEq/L    Glucose, Bld 101 (*) 70 - 99 mg/dL    BUN 21  6 - 23 mg/dL    Creatinine, Ser 4.09  0.50 - 1.35 mg/dL    Calcium 7.6 (*) 8.4 - 10.5 mg/dL    Total Protein 7.5  6.0 - 8.3 g/dL    Albumin 1.8 (*) 3.5 - 5.2 g/dL    AST 28  0 - 37 U/L    ALT 24  0 - 53 U/L    Alkaline Phosphatase 63  39 - 117 U/L    Total Bilirubin 0.4  0.3 - 1.2 mg/dL    GFR calc non Af Amer >90  >90 mL/min    GFR calc Af Amer >90  >90 mL/min   GLUCOSE, CAPILLARY     Status: Abnormal   Collection Time   11/23/11  7:56 AM      Component Value Range Comment   Glucose-Capillary 104 (*) 70 - 99 mg/dL    Comment 1 Documented in Chart      Comment 2 Notify RN       Imaging / Studies: Dg Chest Port 1 View  11/22/2011  *RADIOLOGY REPORT*  Clinical Data: Intubation, rectal carcinoma with pulmonary metastases, hypertension  PORTABLE CHEST - 1 VIEW  Comparison: Portable exam 0709 hours compared to 11/22/2011  Findings: Tip of endotracheal tube 3.3 cm above carina. Nasogastric tube into stomach. Right subclavian power port, tip projecting over SVC. Normal heart size, mediastinal contours, and pulmonary vascularity. Minimal bibasilar atelectasis. Lungs otherwise clear. 2.1 x 2.1 cm right perihilar nodule/metastasis again noted. Additional subtle nodular foci in right lung. No definite pleural effusion or pneumothorax. No acute osseous findings.  IMPRESSION: Minimal  bibasilar atelectasis. Pulmonary metastases. Satisfactory line and tube positions.   Original Report Authenticated By: Lollie Marrow, M.D.    Dg Chest Port 1 View  11/22/2011  *RADIOLOGY REPORT*  Clinical Data: 51 year old male endotracheal tube placement. Metastatic adenocarcinoma.  Respiratory failure.  PORTABLE CHEST - 1 VIEW  Comparison: 11/21/2011 and earlier.  Findings: Portable semi upright AP view 0445 hours.  Endotracheal tube has been advanced.  Tip at the right mainstem bronchus. Enteric tube courses to the abdomen, tip not included.  Stable right chest Port-A-Cath and right PICC line.  Increased opacity at the left lung base.  Stable lung volumes overall.  Cardiac size and mediastinal contours are within normal limits.  No pneumothorax.  No large pleural effusion.  Bilateral pulmonary masses.  IMPRESSION: 1.  Right mainstem bronchus intubation.  Retract the endotracheal tube 4-5 cm for optimal placement. 2.  Increased left lung base atelectasis. 3.  Pulmonary metastatic disease.  Study discussed by telephone with RN Verl Blalock on 11/22/2011 at 0545 hours.   Original Report Authenticated By: Harley Hallmark, M.D.     Medications / Allergies: per chart  Antibiotics: Anti-infectives     Start     Dose/Rate Route Frequency Ordered Stop   11/22/11 2000  imipenem-cilastatin (PRIMAXIN) 250 mg in sodium chloride 0.9 % 100 mL IVPB       250 mg 200 mL/hr over 30 Minutes Intravenous Every 6 hours 11/22/11 1423     11/22/11 1430  imipenem-cilastatin (PRIMAXIN) 250 mg in sodium chloride 0.9 % 100 mL IVPB  250 mg 200 mL/hr over 30 Minutes Intravenous NOW 11/22/11 1423 11/22/11 1545   11/17/11 1400   vancomycin (VANCOCIN) IVPB 1000 mg/200 mL premix  Status:  Discontinued        1,000 mg 200 mL/hr over 60 Minutes Intravenous Every 8 hours 11/17/11 0802 11/19/11 0934   11/15/11 1400   imipenem-cilastatin (PRIMAXIN) 500 mg in sodium chloride 0.9 % 100 mL IVPB  Status:  Discontinued         500 mg 200 mL/hr over 30 Minutes Intravenous 3 times per day 11/15/11 0931 11/22/11 1423   11/15/11 0615   vancomycin (VANCOCIN) 750 mg in sodium chloride 0.9 % 150 mL IVPB  Status:  Discontinued        750 mg 150 mL/hr over 60 Minutes Intravenous 3 times per day 11/15/11 0608 11/17/11 0801   11/14/11 1800   imipenem-cilastatin (PRIMAXIN) 250 mg in sodium chloride 0.9 % 100 mL IVPB  Status:  Discontinued        250 mg 200 mL/hr over 30 Minutes Intravenous 4 times per day 11/14/11 1434 11/15/11 0931   11/13/11 1800   vancomycin (VANCOCIN) 750 mg in sodium chloride 0.9 % 150 mL IVPB  Status:  Discontinued        750 mg 150 mL/hr over 60 Minutes Intravenous Every 12 hours 11/13/11 0631 11/15/11 0608   11/13/11 0645   vancomycin (VANCOCIN) 750 mg in sodium chloride 0.9 % 150 mL IVPB        750 mg 150 mL/hr over 60 Minutes Intravenous  Once 11/13/11 0631 11/13/11 0738   11/12/11 1200   imipenem-cilastatin (PRIMAXIN) 500 mg in sodium chloride 0.9 % 100 mL IVPB  Status:  Discontinued        500 mg 200 mL/hr over 30 Minutes Intravenous 3 times per day 11/12/11 1015 11/14/11 1434   11/11/11 2000   micafungin (MYCAMINE) 100 mg in sodium chloride 0.9 % 100 mL IVPB     Comments: stat      100 mg 100 mL/hr over 1 Hours Intravenous Every 24 hours 11/10/11 1939     11/11/11 1800   vancomycin (VANCOCIN) 500 mg in sodium chloride 0.9 % 100 mL IVPB  Status:  Discontinued        500 mg 100 mL/hr over 60 Minutes Intravenous Every 12 hours 11/11/11 1014 11/13/11 0631   11/10/11 2000   micafungin (MYCAMINE) 100 mg in sodium chloride 0.9 % 100 mL IVPB     Comments: stat      100 mg 100 mL/hr over 1 Hours Intravenous Daily 11/10/11 1939 11/10/11 2215   11/10/11 2000   imipenem-cilastatin (PRIMAXIN) 250 mg in sodium chloride 0.9 % 100 mL IVPB  Status:  Discontinued        250 mg 200 mL/hr over 30 Minutes Intravenous 4 times per day 11/10/11 1951 11/12/11 1014   11/10/11 1630   fluconazole (DIFLUCAN)  IVPB 200 mg  Status:  Discontinued        200 mg 100 mL/hr over 60 Minutes Intravenous Every 24 hours 11/10/11 1617 11/10/11 1939   11/10/11 1630   vancomycin (VANCOCIN) 500 mg in sodium chloride 0.9 % 100 mL IVPB  Status:  Discontinued        500 mg 100 mL/hr over 60 Minutes Intravenous 3 times per day 11/10/11 1617 11/11/11 1014   11/10/11 1630   piperacillin-tazobactam (ZOSYN) IVPB 3.375 g  Status:  Discontinued        3.375 g 12.5 mL/hr  over 240 Minutes Intravenous 3 times per day 11/10/11 1617 11/10/11 1941   11/10/11 0933   cefOXitin (MEFOXIN) 2 g in dextrose 5 % 50 mL IVPB        2 g 100 mL/hr over 30 Minutes Intravenous 60 min pre-op 11/10/11 0926 11/10/11 1105   11/10/11 0930   cefOXitin (MEFOXIN) 1 g in dextrose 5 % 50 mL IVPB  Status:  Discontinued        1 g 100 mL/hr over 30 Minutes Intravenous 3 times per day 11/10/11 0924 11/10/11 0926   11/07/11 2200   cefTRIAXone (ROCEPHIN) 1 g in dextrose 5 % 50 mL IVPB  Status:  Discontinued        1 g 100 mL/hr over 30 Minutes Intravenous Every 24 hours 11/07/11 2124 11/10/11 1536   11/07/11 1830   cefTRIAXone (ROCEPHIN) 1 g in dextrose 5 % 50 mL IVPB        1 g 100 mL/hr over 30 Minutes Intravenous  Once 11/07/11 1819 11/07/11 1920

## 2011-11-23 NOTE — Progress Notes (Signed)
Noted patient extubated and doing well.  Noted SW interaction with patient and family.  Palliative Care will sign off, if you need [place] to re-approach please re-consult.  Antonios Ostrow L. Ladona Ridgel, MD MBA The Palliative Medicine Team at [facility] Phone: [phone] Pager: [phone]

## 2011-11-23 NOTE — Progress Notes (Signed)
PULMONARY / CCM CONSULT NOTE  Requesting MD/Service: Byerly/CCS Date of admission: 9/22 Date of consult: 9/25 Reason for consultation: post op VDRF  Pt Profile:  51 yobm with history of recurrent metastatic rectal cancer underwent exploratory laparotomy, small bowel resection x2, incidental appendectomy 9/25. Was thought to have aspirated in PACU post operatively with feculent emesis reported. Required reintubation in PACU. PCCM asked to assist with vent/CCM issues.  Lines, Tubes, etc: ETT 9/25 >> 9/27>>>9/28>>>10/07 RUE PICC 9/24 >>  R [place] Portacath (longstanding)   Microbiology: Urine 9/22 >> Klebs (pansens) 10/1 blood >>negative 10/1 resp >>negative 10/1 urine >>negative 10/2 wound >> Coag neg staph 10/6 Peritoneal cavity>>  Antibiotics:  Fluconazole 9/25 >> 9/25 Zosyn 9/25 >> 9/25 Vanc 9/25>>>10/4 Micafungin 9/26>>> Imipenem 9/25>>>  Studies/Events: 10/4 CT Abd >>2 large abscesses in abd and pelvis 10/5 CT guided drainage of Lt lower quad abscess  Consults IR  Best Practice: DVT: scd SUP: PPI Nutrition: NPO Glycemic control: SSI   SUBJECTIVE: No events overnight.  Denies chest pain, or dyspnea.  BP 138/89  Pulse 102  Temp 98.4 F (36.9 C) (Oral)  Resp 29  Ht 6\' 2"  (1.88 m)  Wt 113 lb 5.1 oz (51.4 kg)  BMI 14.55 kg/m2  SpO2 97%   EXAM:  General - no distress HEENT - NG in place Cardiac - s1s2 tachycardic, no murmur Chest - scattered rhonchi Abd - soft, mild tenderness Ext - no edema Neuro - normal strength Psych - normal mood, behavior   Ct Guided Abscess Drain  11/21/2011  *RADIOLOGY REPORT*  Clinical Data: Status post bowel resection surgery and history of metastatic rectal carcinoma.  The patient has developed peritoneal fluid collections now requiring drainage.  CT GUIDED DRAINAGE OF PERITONEAL ABSCESS  Sedation:  4.0 mg IV Versed;  200 mcg IV Fentanyl  Total Moderate Sedation Time: 15 minutes.  Procedure:  The procedure, risks, benefits, and  alternatives were explained to the patient.  Questions regarding the procedure were encouraged and answered. The patient understands and consents to the procedure.  The left lower quadrant abdominal wall was prepped with Betadine in a sterile fashion, and a sterile drape was applied covering the operative field.  A sterile gown and sterile gloves were used for the procedure. Local anesthesia was provided with 1% Lidocaine.  An 18 gauge needle was advanced into fluid within the left lower peritoneal cavity. Fluid was aspirated and a sample sent for culture analysis.  A guidewire was advanced.  The tract was dilated and a 12-French pigtail drainage catheter placed.  This catheter was connected to a suction bulb.  The catheter was secured at the skin with a Prolene retention suture.  Complications: None  Findings: Aspirated fluid was thin and amber in color.  A drain was placed in the left lower quadrant.  This does appear to ultimately communicate with another dominant pocket of fluid in the upper quadrant.  IMPRESSION: CT guided drainage of the left lower quadrant peritoneal fluid collection fluid appears non purulent and thin in nature.  A sample was sent for culture analysis. A 12 French drainage catheter was placed and connected to a suction bulb.   Original Report Authenticated By: Reola Calkins, M.D.    Dg Chest Port 1 View  11/22/2011  *RADIOLOGY REPORT*  Clinical Data: Intubation, rectal carcinoma with pulmonary metastases, hypertension  PORTABLE CHEST - 1 VIEW  Comparison: Portable exam 0709 hours compared to 11/22/2011  Findings: Tip of endotracheal tube 3.3 cm above carina. Nasogastric tube  into stomach. Right subclavian power port, tip projecting over SVC. Normal heart size, mediastinal contours, and pulmonary vascularity. Minimal bibasilar atelectasis. Lungs otherwise clear. 2.1 x 2.1 cm right perihilar nodule/metastasis again noted. Additional subtle nodular foci in right lung. No definite pleural  effusion or pneumothorax. No acute osseous findings.  IMPRESSION: Minimal bibasilar atelectasis. Pulmonary metastases. Satisfactory line and tube positions.   Original Report Authenticated By: Lollie Marrow, M.D.    Dg Chest Port 1 View  11/22/2011  *RADIOLOGY REPORT*  Clinical Data: 51 year old male endotracheal tube placement. Metastatic adenocarcinoma.  Respiratory failure.  PORTABLE CHEST - 1 VIEW  Comparison: 11/21/2011 and earlier.  Findings: Portable semi upright AP view 0445 hours.  Endotracheal tube has been advanced.  Tip at the right mainstem bronchus. Enteric tube courses to the abdomen, tip not included.  Stable right chest Port-A-Cath and right PICC line.  Increased opacity at the left lung base.  Stable lung volumes overall.  Cardiac size and mediastinal contours are within normal limits.  No pneumothorax.  No large pleural effusion.  Bilateral pulmonary masses.  IMPRESSION: 1.  Right mainstem bronchus intubation.  Retract the endotracheal tube 4-5 cm for optimal placement. 2.  Increased left lung base atelectasis. 3.  Pulmonary metastatic disease.  Study discussed by telephone with RN Verl Blalock on 11/22/2011 at 0545 hours.   Original Report Authenticated By: Harley Hallmark, M.D.     Lab Results  Component Value Date   CREATININE 0.61 11/23/2011   BUN 21 11/23/2011   NA 136 11/23/2011   K 3.1* 11/23/2011   CL 99 11/23/2011   CO2 26 11/23/2011   Lab Results  Component Value Date   WBC 11.5* 11/22/2011   HGB 10.3* 11/22/2011   HCT 32.5* 11/22/2011   MCV 88.3 11/22/2011   PLT 647* 11/22/2011   Lab Results  Component Value Date   ALT 24 11/23/2011   AST 28 11/23/2011   ALKPHOS 63 11/23/2011   BILITOT 0.4 11/23/2011     IMPRESSION:    Acute respiratory failure likely 2nd to Asp PNA and pulmonary edema>>much improved 10/07.  Continued tobacco abuse prior to admission. P: Titrate oxygen to keep SpO2 > 92% Xopenex prn Keep in even fluid balance>>d/c schedule lasix  Septic shock  from PNA, buttock abscess and pelvic abscesses s/p drain by IR 10/6>>much improved 10/08. P: Continue Imipenem and micafungin for now pending peritoneal fluid culture results  Closed loop obstruction s/p laparotomy 9/25. P: Post-op care and nutrition per surgery  Metastatic rectal cancer P: Per Dr. Truett Perna  HTN P: Continue IV lopressor until able to take oral meds  CBG (last 3)   Basename 11/23/11 0332 11/22/11 2334 11/22/11 2017  GLUCAP 93 96 99  Hyperglycemia P: SSI  Hypokalemia from diuresis P: F/u and replace electrolytes as needed  Anemia of critical illness and chronic disease. P: F/u CBC Transfuse for Hb < 7  Goals of Care. P: No CPR, no pressors  Will need PT/OT when okay with surgery.  Okay to transfer out of ICU if okay with surgery.  Critical care issues resolved.  PCCM will sign off.  Recommend consulting Hospitalist team if medical assistance required.  Coralyn Helling, MD [facility] Pulmonary/Critical Care 11/23/2011, 8:43 AM Pager:  [phone] After 3pm call: [phone]

## 2011-11-23 NOTE — Clinical Social Work Note (Signed)
CSW met with Pt, girlfriend-Janice and Pt's mom-Ethel at the bedside to check in and reassess needs. Pt doing well, stated he was eager to go home. GF inquired if Pt would need a hospital bed at home. Pt quickly declined that suggestion. He stated [facility] was checking on him in the community. CSW inquired if hospice services had been started prior to admission. Pt stated he was not interested. CSW tried to explain supports they can offer. Pt and family decline hospice and SNF currently. They really want to get him home.   Doreen Salvage, LCSWA ICU/Stepdown Clinical Social Worker [facility] Cell [phone] Hours 8am-1200pm M-F

## 2011-11-23 NOTE — Progress Notes (Addendum)
13 Days Post-Op  Subjective: Pt without new c/o; currently being transferred to floor  Objective: Vital signs in last 24 hours: Temp:  [97.9 F (36.6 C)-98.8 F (37.1 C)] 98.1 F (36.7 C) (10/08 0800) Pulse Rate:  [81-110] 110  (10/08 1110) Resp:  [18-31] 31  (10/08 1245) BP: (115-142)/(73-90) 115/73 mmHg (10/08 1200) SpO2:  [93 %-100 %] 98 % (10/08 1200) Last BM Date:  (Colostomy)  Intake/Output from previous day: 10/07 0701 - 10/08 0700 In: 1028 [I.V.:480; IV Piggyback:308] Out: 2930 [Urine:2680; Drains:250] Intake/Output this shift: Total I/O In: 480 [I.V.:80; Other:50; IV Piggyback:350] Out: 1300 [Urine:1300]  LLQ /rt TG drains intact, insertion sites ok,  LLQ drain output about 40 cc's sl turbid, yellow fluid; cx's pend; minimal output rt TG drain  Lab Results:   Basename 11/22/11 0330 11/21/11 0425  WBC 11.5* 16.5*  HGB 10.3* 7.9*  HCT 32.5* 24.4*  PLT 647* 641*   BMET  Basename 11/23/11 0530 11/22/11 0330  NA 136 143  K 3.1* 3.3*  CL 99 105  CO2 26 25  GLUCOSE 101* 102*  BUN 21 26*  CREATININE 0.61 0.70  CALCIUM 7.6* 8.3*   PT/INR No results found for this basename: LABPROT:2,INR:2 in the last 72 hours ABG  Basename 11/22/11 0410 11/21/11 0438  PHART 7.503* 7.485*  HCO3 26.3* 25.7*    Studies/Results: Dg Chest Port 1 View  11/22/2011  *RADIOLOGY REPORT*  Clinical Data: Intubation, rectal carcinoma with pulmonary metastases, hypertension  PORTABLE CHEST - 1 VIEW  Comparison: Portable exam 0709 hours compared to 11/22/2011  Findings: Tip of endotracheal tube 3.3 cm above carina. Nasogastric tube into stomach. Right subclavian power port, tip projecting over SVC. Normal heart size, mediastinal contours, and pulmonary vascularity. Minimal bibasilar atelectasis. Lungs otherwise clear. 2.1 x 2.1 cm right perihilar nodule/metastasis again noted. Additional subtle nodular foci in right lung. No definite pleural effusion or pneumothorax. No acute osseous  findings.  IMPRESSION: Minimal bibasilar atelectasis. Pulmonary metastases. Satisfactory line and tube positions.   Original Report Authenticated By: Lollie Marrow, M.D.    Dg Chest Port 1 View  11/22/2011  *RADIOLOGY REPORT*  Clinical Data: 51 year old male endotracheal tube placement. Metastatic adenocarcinoma.  Respiratory failure.  PORTABLE CHEST - 1 VIEW  Comparison: 11/21/2011 and earlier.  Findings: Portable semi upright AP view 0445 hours.  Endotracheal tube has been advanced.  Tip at the right mainstem bronchus. Enteric tube courses to the abdomen, tip not included.  Stable right chest Port-A-Cath and right PICC line.  Increased opacity at the left lung base.  Stable lung volumes overall.  Cardiac size and mediastinal contours are within normal limits.  No pneumothorax.  No large pleural effusion.  Bilateral pulmonary masses.  IMPRESSION: 1.  Right mainstem bronchus intubation.  Retract the endotracheal tube 4-5 cm for optimal placement. 2.  Increased left lung base atelectasis. 3.  Pulmonary metastatic disease.  Study discussed by telephone with RN Verl Blalock on 11/22/2011 at 0545 hours.   Original Report Authenticated By: Harley Hallmark, M.D.     Anti-infectives: Anti-infectives     Start     Dose/Rate Route Frequency Ordered Stop   11/22/11 2000   imipenem-cilastatin (PRIMAXIN) 250 mg in sodium chloride 0.9 % 100 mL IVPB        250 mg 200 mL/hr over 30 Minutes Intravenous Every 6 hours 11/22/11 1423     11/22/11 1430   imipenem-cilastatin (PRIMAXIN) 250 mg in sodium chloride 0.9 % 100 mL IVPB  250 mg 200 mL/hr over 30 Minutes Intravenous NOW 11/22/11 1423 11/22/11 1545   11/17/11 1400   vancomycin (VANCOCIN) IVPB 1000 mg/200 mL premix  Status:  Discontinued        1,000 mg 200 mL/hr over 60 Minutes Intravenous Every 8 hours 11/17/11 0802 11/19/11 0934   11/15/11 1400   imipenem-cilastatin (PRIMAXIN) 500 mg in sodium chloride 0.9 % 100 mL IVPB  Status:  Discontinued         500 mg 200 mL/hr over 30 Minutes Intravenous 3 times per day 11/15/11 0931 11/22/11 1423   11/15/11 0615   vancomycin (VANCOCIN) 750 mg in sodium chloride 0.9 % 150 mL IVPB  Status:  Discontinued        750 mg 150 mL/hr over 60 Minutes Intravenous 3 times per day 11/15/11 0608 11/17/11 0801   11/14/11 1800   imipenem-cilastatin (PRIMAXIN) 250 mg in sodium chloride 0.9 % 100 mL IVPB  Status:  Discontinued        250 mg 200 mL/hr over 30 Minutes Intravenous 4 times per day 11/14/11 1434 11/15/11 0931   11/13/11 1800   vancomycin (VANCOCIN) 750 mg in sodium chloride 0.9 % 150 mL IVPB  Status:  Discontinued        750 mg 150 mL/hr over 60 Minutes Intravenous Every 12 hours 11/13/11 0631 11/15/11 0608   11/13/11 0645   vancomycin (VANCOCIN) 750 mg in sodium chloride 0.9 % 150 mL IVPB        750 mg 150 mL/hr over 60 Minutes Intravenous  Once 11/13/11 0631 11/13/11 0738   11/12/11 1200   imipenem-cilastatin (PRIMAXIN) 500 mg in sodium chloride 0.9 % 100 mL IVPB  Status:  Discontinued        500 mg 200 mL/hr over 30 Minutes Intravenous 3 times per day 11/12/11 1015 11/14/11 1434   11/11/11 2000   micafungin (MYCAMINE) 100 mg in sodium chloride 0.9 % 100 mL IVPB     Comments: stat      100 mg 100 mL/hr over 1 Hours Intravenous Every 24 hours 11/10/11 1939     11/11/11 1800   vancomycin (VANCOCIN) 500 mg in sodium chloride 0.9 % 100 mL IVPB  Status:  Discontinued        500 mg 100 mL/hr over 60 Minutes Intravenous Every 12 hours 11/11/11 1014 11/13/11 0631   11/10/11 2000   micafungin (MYCAMINE) 100 mg in sodium chloride 0.9 % 100 mL IVPB     Comments: stat      100 mg 100 mL/hr over 1 Hours Intravenous Daily 11/10/11 1939 11/10/11 2215   11/10/11 2000   imipenem-cilastatin (PRIMAXIN) 250 mg in sodium chloride 0.9 % 100 mL IVPB  Status:  Discontinued        250 mg 200 mL/hr over 30 Minutes Intravenous 4 times per day 11/10/11 1951 11/12/11 1014   11/10/11 1630   fluconazole (DIFLUCAN)  IVPB 200 mg  Status:  Discontinued        200 mg 100 mL/hr over 60 Minutes Intravenous Every 24 hours 11/10/11 1617 11/10/11 1939   11/10/11 1630   vancomycin (VANCOCIN) 500 mg in sodium chloride 0.9 % 100 mL IVPB  Status:  Discontinued        500 mg 100 mL/hr over 60 Minutes Intravenous 3 times per day 11/10/11 1617 11/11/11 1014   11/10/11 1630   piperacillin-tazobactam (ZOSYN) IVPB 3.375 g  Status:  Discontinued        3.375 g 12.5 mL/hr  over 240 Minutes Intravenous 3 times per day 11/10/11 1617 11/10/11 1941   11/10/11 0933   cefOXitin (MEFOXIN) 2 g in dextrose 5 % 50 mL IVPB        2 g 100 mL/hr over 30 Minutes Intravenous 60 min pre-op 11/10/11 0926 11/10/11 1105   11/10/11 0930   cefOXitin (MEFOXIN) 1 g in dextrose 5 % 50 mL IVPB  Status:  Discontinued        1 g 100 mL/hr over 30 Minutes Intravenous 3 times per day 11/10/11 0924 11/10/11 0926   11/07/11 2200   cefTRIAXone (ROCEPHIN) 1 g in dextrose 5 % 50 mL IVPB  Status:  Discontinued        1 g 100 mL/hr over 30 Minutes Intravenous Every 24 hours 11/07/11 2124 11/10/11 1536   11/07/11 1830   cefTRIAXone (ROCEPHIN) 1 g in dextrose 5 % 50 mL IVPB        1 g 100 mL/hr over 30 Minutes Intravenous  Once 11/07/11 1819 11/07/11 1920          Assessment/Plan: s/p LLQ fluid collection drainage 10/6, rt TG coll drainage 8/3; check final cx's, monitor labs; check f/u CT once drain outputs decline  LOS: 16 days    Gottlieb Zuercher,D [facility] 11/23/2011

## 2011-11-24 LAB — CBC
HCT: 25.4 % — ABNORMAL LOW (ref 39.0–52.0)
Hemoglobin: 8.3 g/dL — ABNORMAL LOW (ref 13.0–17.0)
MCH: 28.3 pg (ref 26.0–34.0)
MCHC: 32.7 g/dL (ref 30.0–36.0)
RDW: 15.7 % — ABNORMAL HIGH (ref 11.5–15.5)

## 2011-11-24 LAB — BASIC METABOLIC PANEL
BUN: 19 mg/dL (ref 6–23)
Chloride: 100 mEq/L (ref 96–112)
Creatinine, Ser: 0.61 mg/dL (ref 0.50–1.35)
Glucose, Bld: 118 mg/dL — ABNORMAL HIGH (ref 70–99)
Potassium: 3.3 mEq/L — ABNORMAL LOW (ref 3.5–5.1)

## 2011-11-24 LAB — PATHOLOGIST SMEAR REVIEW

## 2011-11-24 LAB — GLUCOSE, CAPILLARY
Glucose-Capillary: 100 mg/dL — ABNORMAL HIGH (ref 70–99)
Glucose-Capillary: 121 mg/dL — ABNORMAL HIGH (ref 70–99)

## 2011-11-24 LAB — CULTURE, ROUTINE-ABSCESS: Special Requests: NORMAL

## 2011-11-24 MED ORDER — LEVALBUTEROL HCL 0.63 MG/3ML IN NEBU
0.6300 mg | INHALATION_SOLUTION | Freq: Four times a day (QID) | RESPIRATORY_TRACT | Status: DC | PRN
  Filled 2011-11-24: qty 3

## 2011-11-24 MED ORDER — ENSURE COMPLETE PO LIQD
237.0000 mL | Freq: Three times a day (TID) | ORAL | Status: DC
  Administered 2011-11-24 – 2011-12-01 (×5): 237 mL via ORAL

## 2011-11-24 NOTE — Clinical Social Work Note (Signed)
CSW signing off currently as Pt and family are very hopeful for d/c home. If PT eval finds Pt has other needs, please reconsult CSW.   Doreen Salvage, LCSWA ICU/Stepdown Clinical Social Worker [facility] Cell [phone] Hours 8am-1200pm M-F

## 2011-11-24 NOTE — Progress Notes (Signed)
14 Days Post-Op  Subjective: Pt without new c/o  Objective: Vital signs in last 24 hours: Temp:  [98.8 F (37.1 C)-99.6 F (37.6 C)] 99.6 F (37.6 C) (10/09 0547) Pulse Rate:  [101-102] 102  (10/09 0547) Resp:  [20-22] 20  (10/09 0547) BP: (120-125)/(80-87) 125/87 mmHg (10/09 0547) SpO2:  [98 %] 98 % (10/09 0547) Last BM Date:  (Colostomy)  Intake/Output from previous day: 10/08 0701 - 10/09 0700 In: 740 [I.V.:80; IV Piggyback:450] Out: 3125 [Urine:2850; Drains:275] Intake/Output this shift: Total I/O In: -  Out: 50 [Drains:50]  Rt TG drain intact, output minimal; LLQ drain intact, output 50 cc's today, cx's- neg to date  Lab Results:   Basename 11/24/11 0500 11/22/11 0330  WBC 15.1* 11.5*  HGB 8.3* 10.3*  HCT 25.4* 32.5*  PLT 912* 647*   BMET  Basename 11/24/11 0500 11/23/11 0530  NA 137 136  K 3.3* 3.1*  CL 100 99  CO2 27 26  GLUCOSE 118* 101*  BUN 19 21  CREATININE 0.61 0.61  CALCIUM 8.0* 7.6*   PT/INR No results found for this basename: LABPROT:2,INR:2 in the last 72 hours ABG  Basename 11/22/11 0410  PHART 7.503*  HCO3 26.3*   Results for orders placed during the hospital encounter of 11/07/11  URINE CULTURE     Status: Normal   Collection Time   11/07/11  4:45 PM      Component Value Range Status Comment   Specimen Description URINE, CLEAN CATCH   Final    Special Requests NONE   Final    Culture  Setup Time 11/07/2011 20:12   Final    Colony Count >=100,000 COLONIES/ML   Final    Culture KLEBSIELLA PNEUMONIAE   Final    Report Status 11/09/2011 FINAL   Final    Organism ID, Bacteria KLEBSIELLA PNEUMONIAE   Final   MRSA PCR SCREENING     Status: Normal   Collection Time   11/10/11  3:55 PM      Component Value Range Status Comment   MRSA by PCR NEGATIVE  NEGATIVE Final   CULTURE, BLOOD (ROUTINE X 2)     Status: Normal   Collection Time   11/16/11  9:30 AM      Component Value Range Status Comment   Specimen Description BLOOD LEFT ARM    Final    Special Requests BOTTLES DRAWN AEROBIC AND ANAEROBIC [facility]   Final    Culture  Setup Time 11/16/2011 19:52   Final    Culture NO GROWTH 5 DAYS   Final    Report Status 11/22/2011 FINAL   Final   CULTURE, BLOOD (ROUTINE X 2)     Status: Normal   Collection Time   11/16/11  9:40 AM      Component Value Range Status Comment   Specimen Description BLOOD LEFT HAND   Final    Special Requests BOTTLES DRAWN AEROBIC AND ANAEROBIC 10CC   Final    Culture  Setup Time 11/16/2011 19:51   Final    Culture NO GROWTH 5 DAYS   Final    Report Status 11/22/2011 FINAL   Final   URINE CULTURE     Status: Normal   Collection Time   11/16/11  9:42 AM      Component Value Range Status Comment   Specimen Description URINE, CATHETERIZED   Final    Special Requests Normal   Final    Culture  Setup Time 11/17/2011 00:59   Final  Colony Count NO GROWTH   Final    Culture NO GROWTH   Final    Report Status 11/17/2011 FINAL   Final   CULTURE, RESPIRATORY     Status: Normal   Collection Time   11/16/11 12:29 PM      Component Value Range Status Comment   Specimen Description TRACHEAL ASPIRATE   Final    Special Requests Normal   Final    Gram Stain     Final    Value: RARE WBC PRESENT, PREDOMINANTLY PMN     NO SQUAMOUS EPITHELIAL CELLS SEEN     NO ORGANISMS SEEN   Culture NO GROWTH 2 DAYS   Final    Report Status 11/19/2011 FINAL   Final   WOUND CULTURE     Status: Normal   Collection Time   11/17/11  5:40 PM      Component Value Range Status Comment   Specimen Description PERIRECTAL   Final    Special Requests Normal   Final    Gram Stain     Final    Value: RARE WBC PRESENT,BOTH PMN AND MONONUCLEAR     NO SQUAMOUS EPITHELIAL CELLS SEEN     NO ORGANISMS SEEN   Culture FEW STAPHYLOCOCCUS SPECIES (COAGULASE NEGATIVE)   Final    Report Status 11/20/2011 FINAL   Final   CULTURE, ROUTINE-ABSCESS     Status: Normal   Collection Time   11/21/11  9:52 AM      Component Value Range Status Comment    Specimen Description PERITONEAL CAVITY   Final    Special Requests Normal   Final    Gram Stain     Final    Value: RARE WBC PRESENT, PREDOMINANTLY PMN     NO SQUAMOUS EPITHELIAL CELLS SEEN     NO ORGANISMS SEEN   Culture NO GROWTH 3 DAYS   Final    Report Status 11/24/2011 FINAL   Final   ANAEROBIC CULTURE     Status: Normal (Preliminary result)   Collection Time   11/21/11  9:52 AM      Component Value Range Status Comment   Specimen Description PERITONEAL CAVITY   Final    Special Requests Normal   Final    Gram Stain PENDING   Incomplete    Culture     Final    Value: NO ANAEROBES ISOLATED; CULTURE IN PROGRESS FOR 5 DAYS   Report Status PENDING   Incomplete     Studies/Results: No results found.  Anti-infectives: Anti-infectives     Start     Dose/Rate Route Frequency Ordered Stop   11/22/11 2000   imipenem-cilastatin (PRIMAXIN) 250 mg in sodium chloride 0.9 % 100 mL IVPB        250 mg 200 mL/hr over 30 Minutes Intravenous Every 6 hours 11/22/11 1423     11/22/11 1430   imipenem-cilastatin (PRIMAXIN) 250 mg in sodium chloride 0.9 % 100 mL IVPB        250 mg 200 mL/hr over 30 Minutes Intravenous NOW 11/22/11 1423 11/22/11 1545   11/17/11 1400   vancomycin (VANCOCIN) IVPB 1000 mg/200 mL premix  Status:  Discontinued        1,000 mg 200 mL/hr over 60 Minutes Intravenous Every 8 hours 11/17/11 0802 11/19/11 0934   11/15/11 1400   imipenem-cilastatin (PRIMAXIN) 500 mg in sodium chloride 0.9 % 100 mL IVPB  Status:  Discontinued        500 mg 200 mL/hr  over 30 Minutes Intravenous 3 times per day 11/15/11 0931 11/22/11 1423   11/15/11 0615   vancomycin (VANCOCIN) 750 mg in sodium chloride 0.9 % 150 mL IVPB  Status:  Discontinued        750 mg 150 mL/hr over 60 Minutes Intravenous 3 times per day 11/15/11 0608 11/17/11 0801   11/14/11 1800   imipenem-cilastatin (PRIMAXIN) 250 mg in sodium chloride 0.9 % 100 mL IVPB  Status:  Discontinued        250 mg 200 mL/hr over 30  Minutes Intravenous 4 times per day 11/14/11 1434 11/15/11 0931   11/13/11 1800   vancomycin (VANCOCIN) 750 mg in sodium chloride 0.9 % 150 mL IVPB  Status:  Discontinued        750 mg 150 mL/hr over 60 Minutes Intravenous Every 12 hours 11/13/11 0631 11/15/11 0608   11/13/11 0645   vancomycin (VANCOCIN) 750 mg in sodium chloride 0.9 % 150 mL IVPB        750 mg 150 mL/hr over 60 Minutes Intravenous  Once 11/13/11 0631 11/13/11 0738   11/12/11 1200   imipenem-cilastatin (PRIMAXIN) 500 mg in sodium chloride 0.9 % 100 mL IVPB  Status:  Discontinued        500 mg 200 mL/hr over 30 Minutes Intravenous 3 times per day 11/12/11 1015 11/14/11 1434   11/11/11 2000   micafungin (MYCAMINE) 100 mg in sodium chloride 0.9 % 100 mL IVPB     Comments: stat      100 mg 100 mL/hr over 1 Hours Intravenous Every 24 hours 11/10/11 1939     11/11/11 1800   vancomycin (VANCOCIN) 500 mg in sodium chloride 0.9 % 100 mL IVPB  Status:  Discontinued        500 mg 100 mL/hr over 60 Minutes Intravenous Every 12 hours 11/11/11 1014 11/13/11 0631   11/10/11 2000   micafungin (MYCAMINE) 100 mg in sodium chloride 0.9 % 100 mL IVPB     Comments: stat      100 mg 100 mL/hr over 1 Hours Intravenous Daily 11/10/11 1939 11/10/11 2215   11/10/11 2000   imipenem-cilastatin (PRIMAXIN) 250 mg in sodium chloride 0.9 % 100 mL IVPB  Status:  Discontinued        250 mg 200 mL/hr over 30 Minutes Intravenous 4 times per day 11/10/11 1951 11/12/11 1014   11/10/11 1630   fluconazole (DIFLUCAN) IVPB 200 mg  Status:  Discontinued        200 mg 100 mL/hr over 60 Minutes Intravenous Every 24 hours 11/10/11 1617 11/10/11 1939   11/10/11 1630   vancomycin (VANCOCIN) 500 mg in sodium chloride 0.9 % 100 mL IVPB  Status:  Discontinued        500 mg 100 mL/hr over 60 Minutes Intravenous 3 times per day 11/10/11 1617 11/11/11 1014   11/10/11 1630   piperacillin-tazobactam (ZOSYN) IVPB 3.375 g  Status:  Discontinued        3.375 g 12.5  mL/hr over 240 Minutes Intravenous 3 times per day 11/10/11 1617 11/10/11 1941   11/10/11 0933   cefOXitin (MEFOXIN) 2 g in dextrose 5 % 50 mL IVPB        2 g 100 mL/hr over 30 Minutes Intravenous 60 min pre-op 11/10/11 0926 11/10/11 1105   11/10/11 0930   cefOXitin (MEFOXIN) 1 g in dextrose 5 % 50 mL IVPB  Status:  Discontinued        1 g 100 mL/hr over 30  Minutes Intravenous 3 times per day 11/10/11 0924 11/10/11 0926   11/07/11 2200   cefTRIAXone (ROCEPHIN) 1 g in dextrose 5 % 50 mL IVPB  Status:  Discontinued        1 g 100 mL/hr over 30 Minutes Intravenous Every 24 hours 11/07/11 2124 11/10/11 1536   11/07/11 1830   cefTRIAXone (ROCEPHIN) 1 g in dextrose 5 % 50 mL IVPB        1 g 100 mL/hr over 30 Minutes Intravenous  Once 11/07/11 1819 11/07/11 1920          Assessment/Plan: s/p rt TG/pelvic coll drainage 8/3, LLQ abscess drainage 10/6; cont current tx as per CCS   LOS: 17 days    ALLRED,D [facility] 11/24/2011

## 2011-11-24 NOTE — Evaluation (Signed)
Physical Therapy Evaluation Patient Details Name: Jeffery [name] MRN: [id number] DOB: 1960-03-20 Today's Date: 11/24/2011 Time: 1015-1024 PT Time Calculation (min): 9 min  PT Assessment / Plan / Recommendation Clinical Impression  51 yo s/p exp lap, small bowel resection, appendectomy 11/10/11. Reintubated in PACU 9/25-9/27. Failed extubation. Reintubated 9/28-10/7. Hx of colon cancer and undergoing chemotherapy. Pt/family refusing SNF and prefer for pt to return home. Pt states wife and mother are able to assist as needed. Pt currently demonstrates general weakness, impaired balance, risk for falls. Recommend HHPT, RW.     PT Assessment  Patient needs continued PT services    Follow Up Recommendations  Home health PT;Supervision for mobility/OOB    Does the patient have the potential to tolerate intense rehabilitation      Barriers to Discharge        Equipment Recommendations  Rolling walker with 5" wheels    Recommendations for Other Services OT consult   Frequency Min 3X/week    Precautions / Restrictions Precautions Precautions: Fall Precaution Comments: multiple drains, lines/leads, PICC line, port-a-cath Restrictions Weight Bearing Restrictions: No   Pertinent Vitals/Pain 5/10 abdomen      Mobility  Bed Mobility Bed Mobility: Supine to Sit;Sit to Sidelying Left Supine to Sit: 5: Supervision Sit to Sidelying Left: 5: Supervision Details for Bed Mobility Assistance: VCs safety, logroll technique.  Transfers Transfers: Sit to Stand;Stand to Sit Sit to Stand: 4: Min guard;From bed Stand to Sit: 4: Min guard;To bed Details for Transfer Assistance: Unsteady. VCs safety.  Ambulation/Gait Ambulation/Gait Assistance: 4: Min assist Ambulation Distance (Feet): 125 Feet Assistive device: None Ambulation/Gait Assistance Details: LOB x 3 while walking in hallway. Unsteady. Assist to stabilize throughout ambulation. VCs pacing, safety. Discussed possible need for RW.  Gait  Pattern: Step-through pattern    Shoulder Instructions     Exercises     PT Diagnosis: Difficulty walking;Abnormality of gait;Acute pain;Generalized weakness  PT Problem List: Decreased strength;Decreased balance;Decreased mobility;Pain;Decreased knowledge of use of DME PT Treatment Interventions: DME instruction;Gait training;Functional mobility training;Therapeutic activities;Therapeutic exercise;Balance training;Patient/family education   PT Goals Acute Rehab PT Goals PT Goal Formulation: With patient Time For Goal Achievement: 12/08/11 Potential to Achieve Goals: Good Pt will go Supine/Side to Sit: with modified independence PT Goal: Supine/Side to Sit - Progress: Goal set today Pt will go Sit to Supine/Side: with modified independence PT Goal: Sit to Supine/Side - Progress: Goal set today Pt will go Sit to Stand: with modified independence PT Goal: Sit to Stand - Progress: Goal set today Pt will Ambulate: >150 feet;with supervision;with least restrictive assistive device PT Goal: Ambulate - Progress: Goal set today  Visit Information  Last PT Received On: 11/24/11 Assistance Needed: +1    Subjective Data  Subjective: "I feel it in my legs...weak" Patient Stated Goal: Home. Get better   Prior Functioning  Home Living Lives With: Spouse (mother) Available Help at Discharge: Family Type of Home: House Home Access: Level entry Home Layout: One level Bathroom Shower/Tub: Network engineer: None Prior Function Level of Independence: Independent Driving: No Communication Communication: No difficulties    Cognition  Overall Cognitive Status: Impaired Area of Impairment: Safety/judgement Arousal/Alertness: Awake/alert Orientation Level: Appears intact for tasks assessed Behavior During Session: WFL for tasks performed Safety/Judgement: Impulsive Safety/Judgement - Other Comments: Pt is aware that he is off balance,  however he does not make appropriate adjustments (slow pace, use support)    Extremity/Trunk Assessment Right Lower Extremity Assessment RLE ROM/Strength/Tone: Deficits  RLE ROM/Strength/Tone Deficits: Strength at least 4/5 with functional activity Left Lower Extremity Assessment LLE ROM/Strength/Tone: Deficits LLE ROM/Strength/Tone Deficits: Strength at least 4/5 with functional activity Trunk Assessment Trunk Assessment: Normal   Balance Balance Balance Assessed: Yes Static Standing Balance Static Standing - Balance Support: No upper extremity supported Static Standing - Comment/# of Minutes: Min-guard assist.  Dynamic Standing Balance Dynamic Standing - Balance Support: No upper extremity supported;During functional activity Dynamic Standing - Level of Assistance: 4: Min assist Dynamic Standing - Comments: Delayed reaction to correct LOB. External assist required. LOB x 3.   End of Session PT - End of Session Equipment Utilized During Treatment: Gait belt Activity Tolerance: Patient tolerated treatment well;Patient limited by pain Patient left: in bed;with call bell/phone within reach  GP     Rebeca Alert [facility] 11/24/2011, 10:40 AM [phone]

## 2011-11-24 NOTE — Progress Notes (Signed)
Critical platelet count  912,result called to DR. Wilson  - no new orders given at this time.

## 2011-11-24 NOTE — Progress Notes (Signed)
[name] [id number] 1960-09-04   Subjective:  Pt denies much pain.  No nausea Transferred to floor Still with flatus Tolerating sips/ice - wants grits but not solids  Objective:  Vital signs:  Filed Vitals:   11/23/11 1200 11/23/11 1245 11/23/11 2110 11/24/11 0203  BP: 115/73  120/83 125/80  Pulse:   101 101  Temp:   98.8 F (37.1 C) 99 F (37.2 C)  TempSrc:   Oral Oral  Resp: 30 31 22 20   Height:      Weight:      SpO2: 98%  98% 98%    Last BM Date:  (Colostomy)  Intake/Output   Yesterday:  10/08 0701 - 10/09 0700 In: 720 [I.V.:80; IV Piggyback:450] Out: 2845 [Urine:2600; Drains:245] This shift:  Total I/O In: 0  Out: 1120 [Urine:950; Drains:170]  Bowel function:  Flatus: YES In bag  BM: N   Physical Exam:  General: Pt awake/alert/oriented x4 in no acute distress Eyes: PERRL, normal EOM.  Sclera clear.  No icterus Neuro: CN II-XII intact w/o focal sensory/motor deficits. Lymph: No head/neck/groin lymphadenopathy Psych:  No delerium/psychosis/paranoia HENT: Normocephalic, Mucus membranes moist.  No thrush Neck: Supple, No tracheal deviation Chest: No chest wall pain w good excursion CV:  Pulses intact.  Regular rhythm Abdomen: Soft.  Mild/mod distended - unchanged.  Mildly tender at incision only.  No incarcerated hernias.  Wound vac in place.  Ostomy +gas/ no stool Ext:  SCDs BLE.  No mjr edema.  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 male  14 Days Post-Op  Procedure(s): LAPAROSCOPIC LYSIS OF ADHESIONS EXPLORATORY LAPAROTOMY APPLICATION OF WOUND VAC  Postop ileus resolving  Intraabd collections, probable abscesses  Plan:  -f/u s/p CT guided drainage of fluid collection - cont drains -IV ABx - f/u Cx's -try thin liquids PO -wound vac -HTN - OK on amlodipine -PPI for GERD -ostomy care -VTE prophylaxis- SCDs, etc -mobilize as tolerated to help recovery.  PT/OT  Long term goals of care an issue.  Stay aggressive for now  Jeffery [name], M.D., F.A.C.S. Gastrointestinal and Minimally Invasive Surgery [facility] [address], [address] [place], [place] [address] [phone] Main / Paging [phone] Voice Mail   11/24/2011  CARE TEAM:  PCP: No primary provider on file.  Outpatient Care Team: Patient Care Team: Jeffery Lint, MD as Consulting Physician (General Surgery) Jeffery Artist, MD as Consulting Physician (Medical Oncology)  Inpatient Treatment Team: Treatment Team: Attending Provider: Almond Lint, MD; Registered Nurse: Jeffery Mend Tai, RN; Technician: Jeffery [name], NT; Registered Nurse: Jeffery Schroeder, RN; Registered Nurse: Jeffery [name], Student-RN; Attending Physician: Jeffery Artist, MD; Respiratory Therapist: Liz [name], RRT; Consulting Physician: Palliative Jeffery [name]; Consulting Physician: Jeffery Sportsman, MD; Registered Nurse: Jeffery Kilts, RN; Technician: Jeffery [name], NT; Respiratory Therapist: Renold [name], RRT; Respiratory Therapist: Antoine [name], RRT; Technician: Jeffery [name], NT; Technician: Jeffery [name], NT; Registered Nurse: Jeffery Rabon, RN  Results:   Labs: Results for orders placed during the hospital encounter of 11/07/11 (from the past 48 hour(s))  GLUCOSE, CAPILLARY     Status: Abnormal   Collection Time   11/22/11  8:07 AM       Component Value Range Comment   Glucose-Capillary 104 (*) 70 - 99 mg/dL    Comment 1 Documented in Chart      Comment 2 Notify RN     GLUCOSE, CAPILLARY     Status: Abnormal   Collection Time   11/22/11 11:58 AM      Component Value Range Comment   Glucose-Capillary 122 (*) 70 - 99 mg/dL    Comment 1 Documented in Chart      Comment 2 Notify RN     GLUCOSE, CAPILLARY     Status: Normal   Collection Time   11/22/11  4:06 PM      Component Value Range Comment   Glucose-Capillary 98  70 - 99 mg/dL    Comment 1 Documented in Chart      Comment 2 Notify RN     GLUCOSE, CAPILLARY     Status: Normal   Collection Time   11/22/11  8:17 PM      Component Value Range Comment   Glucose-Capillary 99  70 - 99 mg/dL    Comment 1 Documented in Chart      Comment 2 Notify RN     GLUCOSE, CAPILLARY     Status: Normal   Collection Time   11/22/11 11:34 PM      Component Value Range Comment   Glucose-Capillary 96  70 - 99 mg/dL    Comment 1 Notify RN     GLUCOSE, CAPILLARY     Status: Normal   Collection Time   11/23/11  3:32 AM      Component Value Range Comment   Glucose-Capillary 93  70 - 99 mg/dL    Comment 1 Notify RN     COMPREHENSIVE METABOLIC PANEL     Status: Abnormal   Collection Time   11/23/11  5:30 AM      Component Value Range Comment   Sodium 136  135 - 145 mEq/L    Potassium 3.1 (*) 3.5 - 5.1 mEq/L    Chloride 99  96 - 112 mEq/L    CO2 26  19 - 32 mEq/L    Glucose, Bld 101 (*) 70 - 99 mg/dL    BUN 21  6 - 23 mg/dL    Creatinine, Ser 2.13  0.50 - 1.35 mg/dL    Calcium 7.6 (*) 8.4 - 10.5 mg/dL    Total Protein 7.5  6.0 - 8.3 g/dL    Albumin 1.8 (*) 3.5 - 5.2 g/dL    AST 28  0 - 37 U/L    ALT 24  0 - 53 U/L    Alkaline Phosphatase 63  39 - 117 U/L    Total Bilirubin 0.4  0.3 - 1.2 mg/dL    GFR calc non Af Amer >90  >90 mL/min    GFR calc Af Amer >90  >90 mL/min   GLUCOSE, CAPILLARY     Status: Abnormal   Collection Time   11/23/11  7:56 AM      Component Value Range  Comment   Glucose-Capillary 104 (*) 70 - 99 mg/dL    Comment 1 Documented in Chart      Comment 2 Notify RN     GLUCOSE, CAPILLARY  Status: Abnormal   Collection Time   11/23/11 11:56 AM      Component Value Range Comment   Glucose-Capillary 171 (*) 70 - 99 mg/dL    Comment 1 Documented in Chart      Comment 2 Notify RN     GLUCOSE, CAPILLARY     Status: Normal   Collection Time   11/23/11  5:12 PM      Component Value Range Comment   Glucose-Capillary 92  70 - 99 mg/dL   GLUCOSE, CAPILLARY     Status: Abnormal   Collection Time   11/23/11  8:14 PM      Component Value Range Comment   Glucose-Capillary 129 (*) 70 - 99 mg/dL   GLUCOSE, CAPILLARY     Status: Abnormal   Collection Time   11/24/11 12:02 AM      Component Value Range Comment   Glucose-Capillary 100 (*) 70 - 99 mg/dL   GLUCOSE, CAPILLARY     Status: Abnormal   Collection Time   11/24/11  4:13 AM      Component Value Range Comment   Glucose-Capillary 121 (*) 70 - 99 mg/dL   BASIC METABOLIC PANEL     Status: Abnormal   Collection Time   11/24/11  5:00 AM      Component Value Range Comment   Sodium 137  135 - 145 mEq/L    Potassium 3.3 (*) 3.5 - 5.1 mEq/L    Chloride 100  96 - 112 mEq/L    CO2 27  19 - 32 mEq/L    Glucose, Bld 118 (*) 70 - 99 mg/dL    BUN 19  6 - 23 mg/dL    Creatinine, Ser 1.61  0.50 - 1.35 mg/dL    Calcium 8.0 (*) 8.4 - 10.5 mg/dL    GFR calc non Af Amer >90  >90 mL/min    GFR calc Af Amer >90  >90 mL/min   CBC     Status: Abnormal   Collection Time   11/24/11  5:00 AM      Component Value Range Comment   WBC 15.1 (*) 4.0 - 10.5 K/uL    RBC 2.93 (*) 4.22 - 5.81 MIL/uL    Hemoglobin 8.3 (*) 13.0 - 17.0 g/dL    HCT 09.6 (*) 04.5 - 52.0 %    MCV 86.7  78.0 - 100.0 fL    MCH 28.3  26.0 - 34.0 pg    MCHC 32.7  30.0 - 36.0 g/dL    RDW 40.9 (*) 81.1 - 15.5 %    Platelets 912 (*) 150 - 400 K/uL   MAGNESIUM     Status: Normal   Collection Time   11/24/11  5:00 AM      Component Value Range  Comment   Magnesium 1.8  1.5 - 2.5 mg/dL     Imaging / Studies: Dg Chest Port 1 View  11/22/2011  *RADIOLOGY REPORT*  Clinical Data: Intubation, rectal carcinoma with pulmonary metastases, hypertension  PORTABLE CHEST - 1 VIEW  Comparison: Portable exam 0709 hours compared to 11/22/2011  Findings: Tip of endotracheal tube 3.3 cm above carina. Nasogastric tube into stomach. Right subclavian power port, tip projecting over SVC. Normal heart size, mediastinal contours, and pulmonary vascularity. Minimal bibasilar atelectasis. Lungs otherwise clear. 2.1 x 2.1 cm right perihilar nodule/metastasis again noted. Additional subtle nodular foci in right lung. No definite pleural effusion or pneumothorax. No acute osseous findings.  IMPRESSION: Minimal bibasilar atelectasis. Pulmonary metastases. Satisfactory  line and tube positions.   Original Report Authenticated By: Lollie Marrow, M.D.     Medications / Allergies: per chart  Antibiotics: Anti-infectives     Start     Dose/Rate Route Frequency Ordered Stop   11/22/11 2000  imipenem-cilastatin (PRIMAXIN) 250 mg in sodium chloride 0.9 % 100 mL IVPB       250 mg 200 mL/hr over 30 Minutes Intravenous Every 6 hours 11/22/11 1423     11/22/11 1430  imipenem-cilastatin (PRIMAXIN) 250 mg in sodium chloride 0.9 % 100 mL IVPB       250 mg 200 mL/hr over 30 Minutes Intravenous NOW 11/22/11 1423 11/22/11 1545   11/17/11 1400   vancomycin (VANCOCIN) IVPB 1000 mg/200 mL premix  Status:  Discontinued        1,000 mg 200 mL/hr over 60 Minutes Intravenous Every 8 hours 11/17/11 0802 11/19/11 0934   11/15/11 1400   imipenem-cilastatin (PRIMAXIN) 500 mg in sodium chloride 0.9 % 100 mL IVPB  Status:  Discontinued        500 mg 200 mL/hr over 30 Minutes Intravenous 3 times per day 11/15/11 0931 11/22/11 1423   11/15/11 0615   vancomycin (VANCOCIN) 750 mg in sodium chloride 0.9 % 150 mL IVPB  Status:  Discontinued        750 mg 150 mL/hr over 60 Minutes  Intravenous 3 times per day 11/15/11 0608 11/17/11 0801   11/14/11 1800   imipenem-cilastatin (PRIMAXIN) 250 mg in sodium chloride 0.9 % 100 mL IVPB  Status:  Discontinued        250 mg 200 mL/hr over 30 Minutes Intravenous 4 times per day 11/14/11 1434 11/15/11 0931   11/13/11 1800   vancomycin (VANCOCIN) 750 mg in sodium chloride 0.9 % 150 mL IVPB  Status:  Discontinued        750 mg 150 mL/hr over 60 Minutes Intravenous Every 12 hours 11/13/11 0631 11/15/11 0608   11/13/11 0645   vancomycin (VANCOCIN) 750 mg in sodium chloride 0.9 % 150 mL IVPB        750 mg 150 mL/hr over 60 Minutes Intravenous  Once 11/13/11 0631 11/13/11 0738   11/12/11 1200   imipenem-cilastatin (PRIMAXIN) 500 mg in sodium chloride 0.9 % 100 mL IVPB  Status:  Discontinued        500 mg 200 mL/hr over 30 Minutes Intravenous 3 times per day 11/12/11 1015 11/14/11 1434   11/11/11 2000   micafungin (MYCAMINE) 100 mg in sodium chloride 0.9 % 100 mL IVPB     Comments: stat      100 mg 100 mL/hr over 1 Hours Intravenous Every 24 hours 11/10/11 1939     11/11/11 1800   vancomycin (VANCOCIN) 500 mg in sodium chloride 0.9 % 100 mL IVPB  Status:  Discontinued        500 mg 100 mL/hr over 60 Minutes Intravenous Every 12 hours 11/11/11 1014 11/13/11 0631   11/10/11 2000   micafungin (MYCAMINE) 100 mg in sodium chloride 0.9 % 100 mL IVPB     Comments: stat      100 mg 100 mL/hr over 1 Hours Intravenous Daily 11/10/11 1939 11/10/11 2215   11/10/11 2000   imipenem-cilastatin (PRIMAXIN) 250 mg in sodium chloride 0.9 % 100 mL IVPB  Status:  Discontinued        250 mg 200 mL/hr over 30 Minutes Intravenous 4 times per day 11/10/11 1951 11/12/11 1014   11/10/11 1630   fluconazole (DIFLUCAN)  IVPB 200 mg  Status:  Discontinued        200 mg 100 mL/hr over 60 Minutes Intravenous Every 24 hours 11/10/11 1617 11/10/11 1939   11/10/11 1630   vancomycin (VANCOCIN) 500 mg in sodium chloride 0.9 % 100 mL IVPB  Status:  Discontinued         500 mg 100 mL/hr over 60 Minutes Intravenous 3 times per day 11/10/11 1617 11/11/11 1014   11/10/11 1630   piperacillin-tazobactam (ZOSYN) IVPB 3.375 g  Status:  Discontinued        3.375 g 12.5 mL/hr over 240 Minutes Intravenous 3 times per day 11/10/11 1617 11/10/11 1941   11/10/11 0933   cefOXitin (MEFOXIN) 2 g in dextrose 5 % 50 mL IVPB        2 g 100 mL/hr over 30 Minutes Intravenous 60 min pre-op 11/10/11 0926 11/10/11 1105   11/10/11 0930   cefOXitin (MEFOXIN) 1 g in dextrose 5 % 50 mL IVPB  Status:  Discontinued        1 g 100 mL/hr over 30 Minutes Intravenous 3 times per day 11/10/11 0924 11/10/11 0926   11/07/11 2200   cefTRIAXone (ROCEPHIN) 1 g in dextrose 5 % 50 mL IVPB  Status:  Discontinued        1 g 100 mL/hr over 30 Minutes Intravenous Every 24 hours 11/07/11 2124 11/10/11 1536   11/07/11 1830   cefTRIAXone (ROCEPHIN) 1 g in dextrose 5 % 50 mL IVPB        1 g 100 mL/hr over 30 Minutes Intravenous  Once 11/07/11 1819 11/07/11 1920

## 2011-11-24 NOTE — Evaluation (Signed)
Occupational Therapy Evaluation Patient Details Name: Jeffery [name] MRN: [id number] DOB: 04/15/1960 Today's Date: 11/24/2011 Time: 1015-1024 OT Time Calculation (min): 9 min  OT Assessment / Plan / Recommendation Clinical Impression  This 51 year old man with a h/o colon CA was admitted with SBO s/p resection.  He is appropriate for skilled OT to work on strength, endurance, and balance to safely perform ADLs.  goals in acute are supervision level    OT Assessment  Patient needs continued OT Services    Follow Up Recommendations  No OT follow up (likely)    Barriers to Discharge      Equipment Recommendations  Rolling walker with 5" wheels    Recommendations for Other Services    Frequency  Min 2X/week    Precautions / Restrictions Precautions Precautions: Fall Precaution Comments: vac, picc, port, drains Restrictions Weight Bearing Restrictions: No   Pertinent Vitals/Pain 5/10 abdomen    ADL  Grooming: Performed;Set up Where Assessed - Grooming: Unsupported sitting Upper Body Bathing: Simulated;Set up Where Assessed - Upper Body Bathing: Unsupported sitting Lower Body Bathing: Simulated;Moderate assistance (due to pain) Where Assessed - Lower Body Bathing: Supported sit to stand Upper Body Dressing: Simulated;Minimal assistance (lines) Where Assessed - Upper Body Dressing: Unsupported sitting Lower Body Dressing: Simulated;Moderate assistance Where Assessed - Lower Body Dressing: Supported sit to Pharmacist, hospital: Mining engineer Method: Sit to Barista:  (bed) Toileting - Clothing Manipulation and Hygiene: Simulated;Minimal assistance Where Assessed - Engineer, mining and Hygiene: Sit to stand from 3-in-1 or toilet Tub/Shower Transfer: Simulated;Minimal assistance Tub/Shower Transfer Method: Science writer: Other (comment) (tub) Equipment Used: Gait  belt Transfers/Ambulation Related to ADLs: Pt unsteady with LOB during simulated tub transfer and ambulating with PT.  He will use RW next time ADL Comments: Pt agreeable to sponge bathing until strength returns.  Wife and mother will help with LB ADLs (due to abdominal pain).  He is not interested in AE    OT Diagnosis: Generalized weakness  OT Problem List: Decreased strength;Decreased activity tolerance;Impaired balance (sitting and/or standing);Decreased safety awareness;Pain OT Treatment Interventions: Self-care/ADL training;DME and/or AE instruction;Balance training;Patient/family education   OT Goals Acute Rehab OT Goals OT Goal Formulation: With patient Time For Goal Achievement: 12/08/11 Potential to Achieve Goals: Good ADL Goals Pt Will Perform Grooming: with supervision;Standing at sink ADL Goal: Grooming - Progress: Goal set today Pt Will Transfer to Toilet: with supervision;Ambulation;Regular height toilet (no more than 1 vc for safety) ADL Goal: Toilet Transfer - Progress: Goal set today Pt Will Perform Toileting - Hygiene: with supervision;Sit to stand from 3-in-1/toilet ADL Goal: Toileting - Hygiene - Progress: Goal set today Pt Will Perform Tub/Shower Transfer: Tub transfer;with min assist;Ambulation (min guard) ADL Goal: Tub/Shower Transfer - Progress: Goal set today  Visit Information  Last OT Received On: 11/24/11 Assistance Needed: +1 PT/OT Co-Evaluation/Treatment: Yes    Subjective Data  Subjective: I just hurt right here (abdomen)--5/10 Patient Stated Goal: get strength back   Prior Functioning     Home Living Lives With: Spouse (mother) Available Help at Discharge: Family Type of Home: House Home Access: Level entry Home Layout: One level Bathroom Shower/Tub: Engineer, manufacturing systems: Standard Home Adaptive Equipment: None Prior Function Level of Independence: Independent Driving: No Communication Communication: No difficulties          Vision/Perception     Cognition  Overall Cognitive Status: Impaired Area of Impairment: Safety/judgement Arousal/Alertness: Awake/alert Orientation Level: Appears intact for tasks  assessed Behavior During Session: [facility] for tasks performed Safety/Judgement: Impulsive Safety/Judgement - Other Comments: Pt is aware that he is off balance, however he does not make appropriate adjustments (slow pace, use support)    Extremity/Trunk Assessment Right Upper Extremity Assessment RUE ROM/Strength/Tone: Within functional levels Left Upper Extremity Assessment LUE ROM/Strength/Tone: Within functional levels Right Lower Extremity Assessment RLE ROM/Strength/Tone: Deficits RLE ROM/Strength/Tone Deficits: Strength at least 4/5 with functional activity Left Lower Extremity Assessment LLE ROM/Strength/Tone: Deficits LLE ROM/Strength/Tone Deficits: Strength at least 4/5 with functional activity Trunk Assessment Trunk Assessment: Normal     Mobility Bed Mobility Bed Mobility: Supine to Sit;Sit to Sidelying Left Supine to Sit: 5: Supervision Sit to Sidelying Left: 5: Supervision Details for Bed Mobility Assistance: educated to roll over for comfort Transfers Sit to Stand: 4: Min guard;From bed Stand to Sit: 4: Min guard;To bed Details for Transfer Assistance: vcs for safety     Shoulder Instructions     Exercise     Balance Balance Balance Assessed: Yes Static Standing Balance Static Standing - Balance Support: No upper extremity supported Static Standing - Comment/# of Minutes: Min-guard assist.  Dynamic Standing Balance Dynamic Standing - Balance Support: No upper extremity supported;During functional activity Dynamic Standing - Level of Assistance: 4: Min assist Dynamic Standing - Comments: Delayed reaction to correct LOB. External assist required. LOB x 3.    End of Session OT - End of Session Activity Tolerance: Patient limited by fatigue Patient left: in bed;with  call bell/phone within reach  GO     Keanen Dohse 11/24/2011, 11:02 AM Marica Otter, OTR/L [phone] 11/24/2011

## 2011-11-25 ENCOUNTER — Inpatient Hospital Stay (HOSPITAL_COMMUNITY): Payer: Medicaid Other

## 2011-11-25 LAB — GLUCOSE, CAPILLARY
Glucose-Capillary: 131 mg/dL — ABNORMAL HIGH (ref 70–99)
Glucose-Capillary: 125 mg/dL — ABNORMAL HIGH (ref 70–99)
Glucose-Capillary: 132 mg/dL — ABNORMAL HIGH (ref 70–99)

## 2011-11-25 NOTE — Progress Notes (Addendum)
15 Days Post-Op  Subjective: Pt without new c/o; still has some intermittent abd pain  Objective: Vital signs in last 24 hours: Temp:  [98.2 F (36.8 C)-98.9 F (37.2 C)] 98.9 F (37.2 C) (10/10 0608) Pulse Rate:  [100-107] 107  (10/10 0608) Resp:  [18-20] 20  (10/10 0608) BP: (108-141)/(73-92) 141/85 mmHg (10/10 0608) SpO2:  [97 %-98 %] 98 % (10/10 0608) Last BM Date:  (Colostomy)  Intake/Output from previous day: 10/09 0701 - 10/10 0700 In: 60  Out: 1105 [Urine:800; Drains:305] Intake/Output this shift: Total I/O In: -  Out: 60 [Drains:60] Right TG drain in place, but needs new statlock device; drain flushed with 5 cc's sterile NS with little return; LLQ drain intact, flushed with 5 cc's sterile NS with little return; outputs -TG drain -30 cc's today; LLQ drain 275 cc's  Lab Results:   Basename 11/24/11 0500  WBC 15.1*  HGB 8.3*  HCT 25.4*  PLT 912*   BMET  Basename 11/24/11 0500 11/23/11 0530  NA 137 136  K 3.3* 3.1*  CL 100 99  CO2 27 26  GLUCOSE 118* 101*  BUN 19 21  CREATININE 0.61 0.61  CALCIUM 8.0* 7.6*   PT/INR No results found for this basename: LABPROT:2,INR:2 in the last 72 hours ABG No results found for this basename: PHART:2,PCO2:2,PO2:2,HCO3:2 in the last 72 hours  Studies/Results: Dg Abd Acute W/chest  11/25/2011  *RADIOLOGY REPORT*  Clinical Data: Abdominal pain, rectal cancer with mets, small bowel resection with colostomy  ACUTE ABDOMEN SERIES (ABDOMEN 2 VIEW & CHEST 1 VIEW)  Comparison: Chest radiograph dated 11/22/2011.  CT abdomen pelvis dated 11/20/2011.  Findings: Stable pulmonary metastases in the right mid/lower lung. No pleural effusion or pneumothorax.  Cardiomediastinal silhouette is within normal limits.  Right chest power port.  Postsurgical changes with suture line in the left mid abdomen.  Two pigtail drains in the pelvis.  Left lower quadrant colostomy.  Again seen are dilated loops of small bowel in the right mid abdomen.  No  evidence of free air under the diaphragm on the upright view.  IMPRESSION: Stable pulmonary metastases.  No evidence of free air.  Stable dilated loops of small bowel in the right mid abdomen, possibly reflecting adynamic ileus or partial small bowel obstruction, correlate with colostomy output.   Original Report Authenticated By: Charline Bills, M.D.     Anti-infectives: Anti-infectives     Start     Dose/Rate Route Frequency Ordered Stop   11/22/11 2000   imipenem-cilastatin (PRIMAXIN) 250 mg in sodium chloride 0.9 % 100 mL IVPB        250 mg 200 mL/hr over 30 Minutes Intravenous Every 6 hours 11/22/11 1423 11/30/11 0816   11/22/11 1430   imipenem-cilastatin (PRIMAXIN) 250 mg in sodium chloride 0.9 % 100 mL IVPB        250 mg 200 mL/hr over 30 Minutes Intravenous NOW 11/22/11 1423 11/22/11 1545   11/17/11 1400   vancomycin (VANCOCIN) IVPB 1000 mg/200 mL premix  Status:  Discontinued        1,000 mg 200 mL/hr over 60 Minutes Intravenous Every 8 hours 11/17/11 0802 11/19/11 0934   11/15/11 1400   imipenem-cilastatin (PRIMAXIN) 500 mg in sodium chloride 0.9 % 100 mL IVPB  Status:  Discontinued        500 mg 200 mL/hr over 30 Minutes Intravenous 3 times per day 11/15/11 0931 11/22/11 1423   11/15/11 0615   vancomycin (VANCOCIN) 750 mg in sodium chloride 0.9 %  150 mL IVPB  Status:  Discontinued        750 mg 150 mL/hr over 60 Minutes Intravenous 3 times per day 11/15/11 0608 11/17/11 0801   11/14/11 1800   imipenem-cilastatin (PRIMAXIN) 250 mg in sodium chloride 0.9 % 100 mL IVPB  Status:  Discontinued        250 mg 200 mL/hr over 30 Minutes Intravenous 4 times per day 11/14/11 1434 11/15/11 0931   11/13/11 1800   vancomycin (VANCOCIN) 750 mg in sodium chloride 0.9 % 150 mL IVPB  Status:  Discontinued        750 mg 150 mL/hr over 60 Minutes Intravenous Every 12 hours 11/13/11 0631 11/15/11 0608   11/13/11 0645   vancomycin (VANCOCIN) 750 mg in sodium chloride 0.9 % 150 mL IVPB          750 mg 150 mL/hr over 60 Minutes Intravenous  Once 11/13/11 0631 11/13/11 0738   11/12/11 1200   imipenem-cilastatin (PRIMAXIN) 500 mg in sodium chloride 0.9 % 100 mL IVPB  Status:  Discontinued        500 mg 200 mL/hr over 30 Minutes Intravenous 3 times per day 11/12/11 1015 11/14/11 1434   11/11/11 2000   micafungin (MYCAMINE) 100 mg in sodium chloride 0.9 % 100 mL IVPB     Comments: stat      100 mg 100 mL/hr over 1 Hours Intravenous Every 24 hours 11/10/11 1939 11/30/11 0816   11/11/11 1800   vancomycin (VANCOCIN) 500 mg in sodium chloride 0.9 % 100 mL IVPB  Status:  Discontinued        500 mg 100 mL/hr over 60 Minutes Intravenous Every 12 hours 11/11/11 1014 11/13/11 0631   11/10/11 2000   micafungin (MYCAMINE) 100 mg in sodium chloride 0.9 % 100 mL IVPB     Comments: stat      100 mg 100 mL/hr over 1 Hours Intravenous Daily 11/10/11 1939 11/10/11 2215   11/10/11 2000   imipenem-cilastatin (PRIMAXIN) 250 mg in sodium chloride 0.9 % 100 mL IVPB  Status:  Discontinued        250 mg 200 mL/hr over 30 Minutes Intravenous 4 times per day 11/10/11 1951 11/12/11 1014   11/10/11 1630   fluconazole (DIFLUCAN) IVPB 200 mg  Status:  Discontinued        200 mg 100 mL/hr over 60 Minutes Intravenous Every 24 hours 11/10/11 1617 11/10/11 1939   11/10/11 1630   vancomycin (VANCOCIN) 500 mg in sodium chloride 0.9 % 100 mL IVPB  Status:  Discontinued        500 mg 100 mL/hr over 60 Minutes Intravenous 3 times per day 11/10/11 1617 11/11/11 1014   11/10/11 1630   piperacillin-tazobactam (ZOSYN) IVPB 3.375 g  Status:  Discontinued        3.375 g 12.5 mL/hr over 240 Minutes Intravenous 3 times per day 11/10/11 1617 11/10/11 1941   11/10/11 0933   cefOXitin (MEFOXIN) 2 g in dextrose 5 % 50 mL IVPB        2 g 100 mL/hr over 30 Minutes Intravenous 60 min pre-op 11/10/11 0926 11/10/11 1105   11/10/11 0930   cefOXitin (MEFOXIN) 1 g in dextrose 5 % 50 mL IVPB  Status:  Discontinued        1  g 100 mL/hr over 30 Minutes Intravenous 3 times per day 11/10/11 0924 11/10/11 0926   11/07/11 2200   cefTRIAXone (ROCEPHIN) 1 g in dextrose 5 % 50 mL  IVPB  Status:  Discontinued        1 g 100 mL/hr over 30 Minutes Intravenous Every 24 hours 11/07/11 2124 11/10/11 1536   11/07/11 1830   cefTRIAXone (ROCEPHIN) 1 g in dextrose 5 % 50 mL IVPB        1 g 100 mL/hr over 30 Minutes Intravenous  Once 11/07/11 1819 11/07/11 1920          Assessment/Plan: s/p TG/LLQ abscess drainages 8/3 and 10/6 respect. ; cont drains as per CCS; check f/u CT near end of week; new statlock device placed onto rt TG drain; consider removing TG drain soon if no residual fluid on next CT due to skin breakdown at insertion site.   LOS: 18 days    Berwyn Bigley,D [facility] 11/25/2011

## 2011-11-25 NOTE — Progress Notes (Signed)
ANTIBIOTIC CONSULT NOTE - FOLLOW UP  Pharmacy Consult for Primaxin Indication: peritonitis, PNA, buttock and pelvic abscess  No Known Allergies  Patient Measurements: Height: 6\' 2"  (188 cm) Weight: 113 lb 5.1 oz (51.4 kg) [name] : 82.2   Vital Signs: Temp: 98.9 F (37.2 C) (10/10 0608) Temp src: Oral (10/10 0608) BP: 141/85 mmHg (10/10 0608) Pulse Rate: 107  (10/10 0608) Intake/Output from previous day: 10/09 0701 - 10/10 0700 In: 60  Out: 1105 [Urine:800; Drains:305] Intake/Output from this shift:    Labs:  Basename 11/24/11 0500 11/23/11 0530  WBC 15.1* --  HGB 8.3* --  PLT 912* --  LABCREA -- --  CREATININE 0.61 0.61   Estimated Creatinine Clearance: 78.5 ml/min (by C-G formula based on Cr of 0.61). No results found for this basename: VANCOTROUGH:2,VANCOPEAK:2,VANCORANDOM:2,GENTTROUGH:2,GENTPEAK:2,GENTRANDOM:2,TOBRATROUGH:2,TOBRAPEAK:2,TOBRARND:2,AMIKACINPEAK:2,AMIKACINTROU:2,AMIKACIN:2, in the last 72 hours   Assessment:  24 yom with h/o recurrent metastatic rectal cancer underwent exploratory laparotomy, small bowel resection x2, incidental appendectomy 9/25 - thought to have aspirated in PACU post operatively with feculent emesis reported. Abx started for peritonitis, aspiration PNA and new pelvic and buttock abscess s/p perc drainage of peritoneal abscess/fluid by IR on 10/6.   Today is D#16 Primaxin, Micafungin.    Plan per CCS is to continue abx 7 days after last drain (both currently scheduled to end 10/15)  Primaxin dose remains appropriate for weight and renal function.  Plan:   Continue Primaxin to 250 mg Iv q6h.  Pharmacy will f/u  Darrol Angel, PharmD Pager: [phone] 11/25/2011,9:53 AM

## 2011-11-25 NOTE — Progress Notes (Signed)
Jeffery [name] [id number] [birth year]   Subjective:  Pt denies much pain.  No nausea Claims flatus & BMs  Objective:  Vital signs:  Filed Vitals:   11/24/11 0547 11/24/11 1400 11/24/11 2108 11/25/11 0608  BP: 125/87 133/92 108/73 141/85  Pulse: 102 100 102 107  Temp: 99.6 F (37.6 C) 98.2 F (36.8 C) 98.9 F (37.2 C) 98.9 F (37.2 C)  TempSrc: Oral Oral Oral Oral  Resp: 20 18 20 20   Height:      Weight:      SpO2: 98% 97% 97% 98%    Last BM Date:  (Colostomy)  Intake/Output   Yesterday:  10/09 0701 - 10/10 0700 In: 60  Out: 1105 [Urine:800; Drains:305] This shift:  Total I/O In: 60 [Other:60] Out: 980 [Urine:800; Drains:180]  Bowel function:  Flatus: Not in bag  BM: None documented - Pt claims yes   Physical Exam:  General: Pt awake/alert/oriented x4 in no acute distress Eyes: PERRL, normal EOM.  Sclera clear.  No icterus Neuro: CN II-XII intact w/o focal sensory/motor deficits. Lymph: No head/neck/groin lymphadenopathy Psych:  No delerium/psychosis/paranoia HENT: Normocephalic, Mucus membranes moist.  No thrush Neck: Supple, No tracheal deviation Chest: No chest wall pain w good excursion CV:  Pulses intact.  Regular rhythm Abdomen: Soft.  Mod distended - unchanged.  Mildly tender at incision only.  No incarcerated hernias.  Wound vac in place.  Ostomy no gas/ no stool Ext:  SCDs BLE.  No mjr edema.  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 male  15 Days Post-Op  Procedure(s): LAPAROSCOPIC LYSIS OF ADHESIONS EXPLORATORY LAPAROTOMY APPLICATION OF WOUND VAC  Postop ileus /PSBO  Intraabd collections, probable abscesses - drained  Plan:  -f/u s/p CT guided drainage of fluid collection - cont drains -IV ABx - f/u Cx's _ on last drain.  Continue ABX 7 days after last drain -thin liquids PO until definite BM.  AXRay if not better -wound vac -HTN - OK on amlodipine -PPI for GERD -ostomy care -VTE prophylaxis- SCDs, etc -mobilize as tolerated to help recovery.  PT/OT  Long term goals of care an issue.  Stay aggressive for now  Ardeth Sportsman, M.D., F.A.C.S. [facility] [address], [address] [place], [place] [address] [phone] Main / Paging [phone] Voice Mail   11/25/2011  CARE TEAM:  PCP: No primary provider on file.  Outpatient Care Team: Patient Care Team: Almond Lint, MD as Consulting Physician (General Surgery) Ladene Artist, MD as Consulting Physician (Medical Oncology)  Inpatient Treatment Team: Treatment Team: Attending Provider: Almond Lint, MD; Registered Nurse: Domingo Mend Tai, RN; Technician: Calton Golds, NT; Registered Nurse: Tristan Schroeder, RN; Registered Nurse: Jairo Ben, Student-RN; Attending Physician: Ladene Artist, MD; Consulting Physician: Palliative Doristine Johns; Consulting Physician: Ardeth Sportsman, MD; Registered Nurse: Oda Kilts, RN; Technician: Roxy Manns, NT; Respiratory Therapist: Renold Genta, RRT; Social Worker: [place], [place]; Registered  Nurse: Romeo Rabon, RN   Results:   Labs: Results for orders placed during the hospital encounter of 11/07/11 (from the past 48 hour(s))  GLUCOSE, CAPILLARY     Status: Abnormal   Collection Time   11/23/11  7:56 AM      Component Value Range Comment    Glucose-Capillary 104 (*) 70 - 99 mg/dL    Comment 1 Documented in Chart      Comment 2 Notify RN     GLUCOSE, CAPILLARY     Status: Abnormal   Collection Time   11/23/11 11:56 AM      Component Value Range Comment   Glucose-Capillary 171 (*) 70 - 99 mg/dL    Comment 1 Documented in Chart      Comment 2 Notify RN     GLUCOSE, CAPILLARY     Status: Normal   Collection Time   11/23/11  5:12 PM      Component Value Range Comment   Glucose-Capillary 92  70 - 99 mg/dL   GLUCOSE, CAPILLARY     Status: Abnormal   Collection Time   11/23/11  8:14 PM      Component Value Range Comment   Glucose-Capillary 129 (*) 70 - 99 mg/dL   GLUCOSE, CAPILLARY     Status: Abnormal   Collection Time   11/24/11 12:02 AM      Component Value Range Comment   Glucose-Capillary 100 (*) 70 - 99 mg/dL   GLUCOSE, CAPILLARY     Status: Abnormal   Collection Time   11/24/11  4:13 AM      Component Value Range Comment   Glucose-Capillary 121 (*) 70 - 99 mg/dL   BASIC METABOLIC PANEL     Status: Abnormal   Collection Time   11/24/11  5:00 AM      Component Value Range Comment   Sodium 137  135 - 145 mEq/L    Potassium 3.3 (*) 3.5 - 5.1 mEq/L    Chloride 100  96 - 112 mEq/L    CO2 27  19 - 32 mEq/L    Glucose, Bld 118 (*) 70 - 99 mg/dL    BUN 19  6 - 23 mg/dL    Creatinine, Ser 4.54  0.50 - 1.35 mg/dL    Calcium 8.0 (*) 8.4 - 10.5 mg/dL    GFR calc non Af Amer >90  >90 mL/min    GFR calc Af Amer >90  >90 mL/min   CBC     Status: Abnormal   Collection Time   11/24/11  5:00 AM      Component Value Range Comment   WBC 15.1 (*) 4.0 - 10.5 K/uL    RBC 2.93 (*) 4.22 - 5.81 MIL/uL    Hemoglobin 8.3 (*) 13.0 - 17.0 g/dL    HCT 09.8 (*) 11.9 - 52.0 %    MCV 86.7  78.0 - 100.0 fL    MCH 28.3  26.0 - 34.0 pg    MCHC 32.7  30.0 - 36.0 g/dL    RDW 14.7 (*) 82.9 - 15.5 %    Platelets 912 (*) 150 - 400 K/uL   MAGNESIUM     Status: Normal   Collection Time   11/24/11  5:00 AM      Component Value Range Comment    Magnesium 1.8  1.5 - 2.5 mg/dL   PATHOLOGIST SMEAR REVIEW     Status: Normal   Collection Time   11/24/11  5:00 AM  Component Value Range Comment   Path Review Reviewed By Havery Moros, M.D.     GLUCOSE, CAPILLARY     Status: Abnormal   Collection Time   11/24/11  7:43 AM      Component Value Range Comment   Glucose-Capillary 119 (*) 70 - 99 mg/dL   GLUCOSE, CAPILLARY     Status: Abnormal   Collection Time   11/24/11 11:48 AM      Component Value Range Comment   Glucose-Capillary 131 (*) 70 - 99 mg/dL   GLUCOSE, CAPILLARY     Status: Abnormal   Collection Time   11/24/11  9:24 PM      Component Value Range Comment   Glucose-Capillary 136 (*) 70 - 99 mg/dL   GLUCOSE, CAPILLARY     Status: Abnormal   Collection Time   11/25/11 12:16 AM      Component Value Range Comment   Glucose-Capillary 125 (*) 70 - 99 mg/dL   GLUCOSE, CAPILLARY     Status: Abnormal   Collection Time   11/25/11  4:14 AM      Component Value Range Comment   Glucose-Capillary 126 (*) 70 - 99 mg/dL     Imaging / Studies: No results found.  Medications / Allergies: per chart  Antibiotics: Anti-infectives     Start     Dose/Rate Route Frequency Ordered Stop   11/22/11 2000  imipenem-cilastatin (PRIMAXIN) 250 mg in sodium chloride 0.9 % 100 mL IVPB       250 mg 200 mL/hr over 30 Minutes Intravenous Every 6 hours 11/22/11 1423     11/22/11 1430  imipenem-cilastatin (PRIMAXIN) 250 mg in sodium chloride 0.9 % 100 mL IVPB       250 mg 200 mL/hr over 30 Minutes Intravenous NOW 11/22/11 1423 11/22/11 1545   11/17/11 1400   vancomycin (VANCOCIN) IVPB 1000 mg/200 mL premix  Status:  Discontinued        1,000 mg 200 mL/hr over 60 Minutes Intravenous Every 8 hours 11/17/11 0802 11/19/11 0934   11/15/11 1400   imipenem-cilastatin (PRIMAXIN) 500 mg in sodium chloride 0.9 % 100 mL IVPB  Status:  Discontinued        500 mg 200 mL/hr over 30 Minutes Intravenous 3 times per day 11/15/11 0931 11/22/11 1423    11/15/11 0615   vancomycin (VANCOCIN) 750 mg in sodium chloride 0.9 % 150 mL IVPB  Status:  Discontinued        750 mg 150 mL/hr over 60 Minutes Intravenous 3 times per day 11/15/11 0608 11/17/11 0801   11/14/11 1800   imipenem-cilastatin (PRIMAXIN) 250 mg in sodium chloride 0.9 % 100 mL IVPB  Status:  Discontinued        250 mg 200 mL/hr over 30 Minutes Intravenous 4 times per day 11/14/11 1434 11/15/11 0931   11/13/11 1800   vancomycin (VANCOCIN) 750 mg in sodium chloride 0.9 % 150 mL IVPB  Status:  Discontinued        750 mg 150 mL/hr over 60 Minutes Intravenous Every 12 hours 11/13/11 0631 11/15/11 0608   11/13/11 0645   vancomycin (VANCOCIN) 750 mg in sodium chloride 0.9 % 150 mL IVPB        750 mg 150 mL/hr over 60 Minutes Intravenous  Once 11/13/11 0631 11/13/11 0738   11/12/11 1200   imipenem-cilastatin (PRIMAXIN) 500 mg in sodium chloride 0.9 % 100 mL IVPB  Status:  Discontinued        500 mg  200 mL/hr over 30 Minutes Intravenous 3 times per day 11/12/11 1015 11/14/11 1434   11/11/11 2000   micafungin (MYCAMINE) 100 mg in sodium chloride 0.9 % 100 mL IVPB     Comments: stat      100 mg 100 mL/hr over 1 Hours Intravenous Every 24 hours 11/10/11 1939     11/11/11 1800   vancomycin (VANCOCIN) 500 mg in sodium chloride 0.9 % 100 mL IVPB  Status:  Discontinued        500 mg 100 mL/hr over 60 Minutes Intravenous Every 12 hours 11/11/11 1014 11/13/11 0631   11/10/11 2000   micafungin (MYCAMINE) 100 mg in sodium chloride 0.9 % 100 mL IVPB     Comments: stat      100 mg 100 mL/hr over 1 Hours Intravenous Daily 11/10/11 1939 11/10/11 2215   11/10/11 2000   imipenem-cilastatin (PRIMAXIN) 250 mg in sodium chloride 0.9 % 100 mL IVPB  Status:  Discontinued        250 mg 200 mL/hr over 30 Minutes Intravenous 4 times per day 11/10/11 1951 11/12/11 1014   11/10/11 1630   fluconazole (DIFLUCAN) IVPB 200 mg  Status:  Discontinued        200 mg 100 mL/hr over 60 Minutes Intravenous  Every 24 hours 11/10/11 1617 11/10/11 1939   11/10/11 1630   vancomycin (VANCOCIN) 500 mg in sodium chloride 0.9 % 100 mL IVPB  Status:  Discontinued        500 mg 100 mL/hr over 60 Minutes Intravenous 3 times per day 11/10/11 1617 11/11/11 1014   11/10/11 1630   piperacillin-tazobactam (ZOSYN) IVPB 3.375 g  Status:  Discontinued        3.375 g 12.5 mL/hr over 240 Minutes Intravenous 3 times per day 11/10/11 1617 11/10/11 1941   11/10/11 0933   cefOXitin (MEFOXIN) 2 g in dextrose 5 % 50 mL IVPB        2 g 100 mL/hr over 30 Minutes Intravenous 60 min pre-op 11/10/11 0926 11/10/11 1105   11/10/11 0930   cefOXitin (MEFOXIN) 1 g in dextrose 5 % 50 mL IVPB  Status:  Discontinued        1 g 100 mL/hr over 30 Minutes Intravenous 3 times per day 11/10/11 0924 11/10/11 0926   11/07/11 2200   cefTRIAXone (ROCEPHIN) 1 g in dextrose 5 % 50 mL IVPB  Status:  Discontinued        1 g 100 mL/hr over 30 Minutes Intravenous Every 24 hours 11/07/11 2124 11/10/11 1536   11/07/11 1830   cefTRIAXone (ROCEPHIN) 1 g in dextrose 5 % 50 mL IVPB        1 g 100 mL/hr over 30 Minutes Intravenous  Once 11/07/11 1819 11/07/11 1920

## 2011-11-25 NOTE — Progress Notes (Signed)
Encouraged pt to drink ensure but pt refused

## 2011-11-25 NOTE — Progress Notes (Signed)
Occupational Therapy Treatment Patient Details Name: Jeffery [name] MRN: [id number] DOB: 08-May-1960 Today's Date: 11/25/2011 Time: 4098-1191 OT Time Calculation (min): 15 min  OT Assessment / Plan / Recommendation Comments on Treatment Session Pt appeared safety today than on initial evaluation.  Pt states his hands feel weak.  Tennis ball, squeeze ball and puddy provided to give pt something to work with while therapy not there to strenthen his hands.    Follow Up Recommendations  No OT follow up    Barriers to Discharge       Equipment Recommendations  Rolling walker with 5" wheels    Recommendations for Other Services    Frequency Min 2X/week   Plan Discharge plan remains appropriate    Precautions / Restrictions Precautions Precautions: Fall Precaution Comments: vac, picc, port, drains Restrictions Weight Bearing Restrictions: No   Pertinent Vitals/Pain Pt with 4/10 pain.  Nursing aware.    ADL  Grooming: Performed;Supervision/safety Where Assessed - Grooming: Unsupported standing Upper Body Dressing: Performed;Minimal assistance;Other (comment) (if pt did not have lines he would be I.) Where Assessed - Upper Body Dressing: Unsupported sitting Lower Body Dressing: Performed;Supervision/safety Where Assessed - Lower Body Dressing: Unsupported sit to stand Toilet Transfer: Performed;Supervision/safety Toilet Transfer Method: Sit to Barista: Comfort height toilet;Grab bars Toileting - Clothing Manipulation and Hygiene: Performed;Supervision/safety Where Assessed - Engineer, mining and Hygiene: Sit to stand from 3-in-1 or toilet Equipment Used: Rolling walker Transfers/Ambulation Related to ADLs: Pt more steady today walking. Pt used walker but at times was seen just carrying walker.  No LOB in room with any ambulation or transfers. ADL Comments: Pt able to bring feet up to him to bathe and dress.      OT Diagnosis:    OT Problem  List:   OT Treatment Interventions:     OT Goals Acute Rehab OT Goals OT Goal Formulation: With patient Time For Goal Achievement: 12/08/11 Potential to Achieve Goals: Good ADL Goals Pt Will Perform Grooming: with supervision;Standing at sink ADL Goal: Grooming - Progress: Met Pt Will Transfer to Toilet: with supervision;Ambulation;Regular height toilet ADL Goal: Toilet Transfer - Progress: Met Pt Will Perform Toileting - Hygiene: with supervision;Sit to stand from 3-in-1/toilet ADL Goal: Toileting - Hygiene - Progress: Met Pt Will Perform Tub/Shower Transfer: Tub transfer;with min assist;Ambulation  Visit Information  Last OT Received On: 11/25/11 Assistance Needed: +1    Subjective Data      Prior Functioning       Cognition  Overall Cognitive Status: Appears within functional limits for tasks assessed/performed Arousal/Alertness: Awake/alert Orientation Level: Appears intact for tasks assessed Behavior During Session: [facility] for tasks performed Safety/Judgement: Impulsive Safety/Judgement - Other Comments: Feel pt may have been a bit impulsive before admit per significant other. Cognition - Other Comments: Pt appeared safer today.  Pt wanting and asking to use walker and stating why it is important for him to avoid falling.    Mobility  Shoulder Instructions Bed Mobility Bed Mobility: Supine to Sit Supine to Sit: 7: Independent Details for Bed Mobility Assistance: educated to roll over for comfort Transfers Transfers: Sit to Stand;Stand to Sit Sit to Stand: 5: Supervision;From bed Stand to Sit: 5: Supervision;To bed Details for Transfer Assistance: cues to use walker, not carry it.       Exercises  Hand Exercises Forearm Supination: Squeeze ball;Hand exerciser Forearm Pronation: Squeeze ball;Hand exerciser Digit Composite Flexion: Squeeze ball;Hand exerciser Thumb Abduction: Squeeze ball Thumb Adduction: Squeeze ball Opposition: Squeeze ball  Balance       End of Session OT - End of Session Activity Tolerance: Patient tolerated treatment well Patient left: in chair;with call bell/phone within reach;with family/visitor present Nurse Communication: Mobility status  GO     Hope Budds 11/25/2011, 10:05 AM [phone]

## 2011-11-25 NOTE — Progress Notes (Signed)
Nutrition Brief Note  Intervention: Will monitor and analyze results of calorie count.   - Pt started full liquid diet yesterday, on regular diet today. Pt reports eating 100% of his breakfast and drinking 100% of 1 Ensure Complete yesterday. MD ordered 48 hour calorie count to start today. Pt denies any nausea.   Levon Hedger MS, RD, LDN [phone] Pager [phone] After Hours Pager

## 2011-11-26 LAB — GLUCOSE, CAPILLARY
Glucose-Capillary: 131 mg/dL — ABNORMAL HIGH (ref 70–99)
Glucose-Capillary: 118 mg/dL — ABNORMAL HIGH (ref 70–99)
Glucose-Capillary: 141 mg/dL — ABNORMAL HIGH (ref 70–99)

## 2011-11-26 LAB — ANAEROBIC CULTURE

## 2011-11-26 MED ORDER — SODIUM CHLORIDE 0.9 % IV SOLN
INTRAVENOUS | Status: DC
  Administered 2011-11-26: 500 mL via INTRAVENOUS
  Administered 2011-11-27 – 2011-11-28 (×2): 20 mL/h via INTRAVENOUS
  Administered 2011-12-01: 04:00:00 via INTRAVENOUS

## 2011-11-26 MED ORDER — POLYETHYLENE GLYCOL 3350 17 G PO PACK
17.0000 g | PACK | Freq: Two times a day (BID) | ORAL | Status: DC
  Administered 2011-11-26 – 2011-12-01 (×10): 17 g via ORAL
  Filled 2011-11-26 (×14): qty 1

## 2011-11-26 NOTE — Progress Notes (Signed)
Wound Vac Changed. Measures 14cmx2cm. Unable to measure depth. Tolerated change well.

## 2011-11-26 NOTE — Progress Notes (Addendum)
Jeffery STROTHMAN [id number] 27-May-1960   Subjective:  Pt denies much pain.  No nausea Trying grapes & milk - usually helps w BMs.  Wife in room Claims to be walking Claims flatus but no BMs  Objective:  Vital signs:  Filed Vitals:   11/25/11 1015 11/25/11 1400 11/25/11 2124 11/26/11 0522  BP: 134/91 118/79 142/82 122/76  Pulse: 111 74 103 101  Temp: 98.5 F (36.9 C) 97.5 F (36.4 C) 98.3 F (36.8 C) 98.2 F (36.8 C)  TempSrc: Oral Oral Oral Tympanic  Resp: 20 20 18 18   Height:      Weight:      SpO2: 99% 99% 94% 98%    Last BM Date:  (Colostomy)  Intake/Output   Yesterday:  10/10 0701 - 10/11 0700 In: 320 [IV Piggyback:300] Out: 1475 [Urine:1175; Drains:300] This shift:  Total I/O In: 300 [IV Piggyback:300] Out: 660 [Urine:600; Drains:60]  Bowel function:  Flatus: Not in bag  BM: No   Physical Exam:  General: Pt awake/alert/oriented x4 in no acute distress Eyes: PERRL, normal EOM.  Sclera clear.  No icterus Neuro: CN II-XII intact w/o focal sensory/motor deficits. Lymph: No head/neck/groin lymphadenopathy Psych:  No delerium/psychosis/paranoia HENT: Normocephalic, Mucus membranes moist.  No thrush Neck: Supple, No tracheal deviation Chest: No chest wall pain w good excursion CV:  Pulses intact.  Regular rhythm Abdomen: Soft.  Mod distended - unchanged.  Mildly tender at incision only.  No incarcerated hernias.  Wound vac in place.  Ostomy no gas/ no stool Ext:  SCDs BLE.  No mjr edema.  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 male  16 Days Post-Op  Procedure(s): LAPAROSCOPIC LYSIS OF ADHESIONS EXPLORATORY LAPAROTOMY APPLICATION OF WOUND VAC  Postop ileus /PSBO  Intraabd collections, probable abscesses - drained  Plan:  -f/u s/p CT guided drainage of fluid collection - cont drains.  F/u CT Sunday to re-eval -IV ABx - f/u Cx's _ on last drain.  Continue ABX 7 days after last drain -thin liquids PO until definite BM.  AXRay with dilated SB loop prob ileus - follow -try Miralax w +flatus -wound vac -HTN - OK on amlodipine -PPI for GERD -ostomy care -VTE prophylaxis- SCDs, etc -mobilize as tolerated to help recovery.  PT/OT  Long term goals of care an issue.  Stay aggressive for now  Ardeth Sportsman, M.D., F.A.C.S. Gastrointestinal and Minimally Invasive Surgery [facility] [address] [place], [place] [address] [phone] Main / Paging [phone] Voice Mail   11/26/2011  CARE TEAM:  PCP: No primary provider on file.  Outpatient Care Team: Patient Care Team: Almond Lint, MD as Consulting Physician (General Surgery) Ladene Artist, MD as Consulting Physician (Medical Oncology)  Inpatient Treatment Team: Treatment Team: Attending Provider: Almond Lint, MD; Registered Nurse: Domingo Mend Tai, RN; Technician: Calton Golds, NT; Registered Nurse: Tristan Schroeder, RN; Registered Nurse: Jairo Ben, Student-RN; Attending Physician: Ladene Artist, MD; Consulting Physician: Pall[facility]; Consulting  Physician: Ardeth Sportsman, MD; Registered Nurse: Oda Kilts, RN; Technician: Roxy Manns, NT; Respiratory Therapist: Renold Genta, RRT; Social Worker: Solmon Ice [place], [place]; Respiratory Therapist: Carney Harder, RRT   Results:   Labs: Results for orders placed during the hospital encounter of  11/07/11 (from the past 48 hour(s))  GLUCOSE, CAPILLARY     Status: Abnormal   Collection Time   11/24/11  7:43 AM      Component Value Range Comment   Glucose-Capillary 119 (*) 70 - 99 mg/dL   GLUCOSE, CAPILLARY     Status: Abnormal   Collection Time   11/24/11 11:48 AM      Component Value Range Comment   Glucose-Capillary 131 (*) 70 - 99 mg/dL   GLUCOSE, CAPILLARY     Status: Abnormal   Collection Time   11/24/11  9:24 PM      Component Value Range Comment   Glucose-Capillary 136 (*) 70 - 99 mg/dL   GLUCOSE, CAPILLARY     Status: Abnormal   Collection Time   11/25/11 12:16 AM      Component Value Range Comment   Glucose-Capillary 125 (*) 70 - 99 mg/dL   GLUCOSE, CAPILLARY     Status: Abnormal   Collection Time   11/25/11  4:14 AM      Component Value Range Comment   Glucose-Capillary 126 (*) 70 - 99 mg/dL   GLUCOSE, CAPILLARY     Status: Abnormal   Collection Time   11/25/11  7:21 AM      Component Value Range Comment   Glucose-Capillary 131 (*) 70 - 99 mg/dL   GLUCOSE, CAPILLARY     Status: Normal   Collection Time   11/25/11 11:46 AM      Component Value Range Comment   Glucose-Capillary 89  70 - 99 mg/dL   GLUCOSE, CAPILLARY     Status: Abnormal   Collection Time   11/25/11  4:33 PM      Component Value Range Comment   Glucose-Capillary 125 (*) 70 - 99 mg/dL   GLUCOSE, CAPILLARY     Status: Abnormal   Collection Time   11/25/11  8:10 PM      Component Value Range Comment   Glucose-Capillary 132 (*) 70 - 99 mg/dL   GLUCOSE, CAPILLARY     Status: Abnormal   Collection Time   11/25/11 11:47 PM      Component Value Range Comment   Glucose-Capillary 111 (*) 70 - 99 mg/dL   GLUCOSE, CAPILLARY     Status: Abnormal   Collection Time   11/26/11  3:11 AM      Component Value Range Comment   Glucose-Capillary 131 (*) 70 - 99 mg/dL     Imaging / Studies: Dg Abd Acute W/chest  11/25/2011  *RADIOLOGY REPORT*  Clinical Data: Abdominal pain, rectal cancer with mets,  small bowel resection with colostomy  ACUTE ABDOMEN SERIES (ABDOMEN 2 VIEW & CHEST 1 VIEW)  Comparison: Chest radiograph dated 11/22/2011.  CT abdomen pelvis dated 11/20/2011.  Findings: Stable pulmonary metastases in the right mid/lower lung. No pleural effusion or pneumothorax.  Cardiomediastinal silhouette is within normal limits.  Right chest power port.  Postsurgical changes with suture line in the left mid abdomen.  Two pigtail drains in the pelvis.  Left lower quadrant colostomy.  Again seen are dilated loops of small bowel in the right mid abdomen.  No evidence of free air under the diaphragm on the upright view.  IMPRESSION: Stable pulmonary metastases.  No evidence of free air.  Stable dilated loops of small bowel in the right mid abdomen, possibly reflecting adynamic ileus or partial small bowel obstruction, correlate with colostomy output.   Original Report Authenticated By: Charline Bills, M.D.     Medications / Allergies: per chart  Antibiotics: Anti-infectives     Start     Dose/Rate Route Frequency Ordered Stop   11/22/11 2000  imipenem-cilastatin (PRIMAXIN) 250 mg in sodium chloride 0.9 % 100 mL IVPB       250 mg 200 mL/hr over 30 Minutes Intravenous Every 6 hours 11/22/11 1423 11/30/11 0816   11/22/11 1430  imipenem-cilastatin (PRIMAXIN) 250 mg in sodium chloride 0.9 % 100 mL IVPB       250 mg 200 mL/hr over 30 Minutes Intravenous NOW 11/22/11 1423 11/22/11 1545   11/17/11 1400   vancomycin (VANCOCIN) IVPB 1000 mg/200 mL premix  Status:  Discontinued        1,000 mg 200 mL/hr over 60 Minutes Intravenous Every 8 hours 11/17/11 0802 11/19/11 0934   11/15/11 1400   imipenem-cilastatin (PRIMAXIN) 500 mg in sodium chloride 0.9 % 100 mL IVPB  Status:  Discontinued        500 mg 200 mL/hr over 30 Minutes Intravenous 3 times per day 11/15/11 0931 11/22/11 1423   11/15/11 0615   vancomycin (VANCOCIN) 750 mg in sodium chloride 0.9 % 150 mL IVPB  Status:  Discontinued        750  mg 150 mL/hr over 60 Minutes Intravenous 3 times per day 11/15/11 0608 11/17/11 0801   11/14/11 1800   imipenem-cilastatin (PRIMAXIN) 250 mg in sodium chloride 0.9 % 100 mL IVPB  Status:  Discontinued        250 mg 200 mL/hr over 30 Minutes Intravenous 4 times per day 11/14/11 1434 11/15/11 0931   11/13/11 1800   vancomycin (VANCOCIN) 750 mg in sodium chloride 0.9 % 150 mL IVPB  Status:  Discontinued        750 mg 150 mL/hr over 60 Minutes Intravenous Every 12 hours 11/13/11 0631 11/15/11 0608   11/13/11 0645   vancomycin (VANCOCIN) 750 mg in sodium chloride 0.9 % 150 mL IVPB        750 mg 150 mL/hr over 60 Minutes Intravenous  Once 11/13/11 0631 11/13/11 0738   11/12/11 1200   imipenem-cilastatin (PRIMAXIN) 500 mg in sodium chloride 0.9 % 100 mL IVPB  Status:  Discontinued        500 mg 200 mL/hr over 30 Minutes Intravenous 3 times per day 11/12/11 1015 11/14/11 1434   11/11/11 2000   micafungin (MYCAMINE) 100 mg in sodium chloride 0.9 % 100 mL IVPB     Comments: stat      100 mg 100 mL/hr over 1 Hours Intravenous Every 24 hours 11/10/11 1939 11/30/11 0816   11/11/11 1800   vancomycin (VANCOCIN) 500 mg in sodium chloride 0.9 % 100 mL IVPB  Status:  Discontinued        500 mg 100 mL/hr over 60 Minutes Intravenous Every 12 hours 11/11/11 1014 11/13/11 0631   11/10/11 2000   micafungin (MYCAMINE) 100 mg in sodium chloride 0.9 % 100 mL IVPB     Comments: stat      100 mg 100 mL/hr over 1 Hours Intravenous Daily 11/10/11 1939 11/10/11 2215   11/10/11 2000   imipenem-cilastatin (PRIMAXIN) 250 mg in sodium chloride 0.9 % 100 mL IVPB  Status:  Discontinued  250 mg 200 mL/hr over 30 Minutes Intravenous 4 times per day 11/10/11 1951 11/12/11 1014   11/10/11 1630   fluconazole (DIFLUCAN) IVPB 200 mg  Status:  Discontinued        200 mg 100 mL/hr over 60 Minutes Intravenous Every 24 hours 11/10/11 1617 11/10/11 1939   11/10/11 1630   vancomycin (VANCOCIN) 500 mg in sodium chloride  0.9 % 100 mL IVPB  Status:  Discontinued        500 mg 100 mL/hr over 60 Minutes Intravenous 3 times per day 11/10/11 1617 11/11/11 1014   11/10/11 1630   piperacillin-tazobactam (ZOSYN) IVPB 3.375 g  Status:  Discontinued        3.375 g 12.5 mL/hr over 240 Minutes Intravenous 3 times per day 11/10/11 1617 11/10/11 1941   11/10/11 0933   cefOXitin (MEFOXIN) 2 g in dextrose 5 % 50 mL IVPB        2 g 100 mL/hr over 30 Minutes Intravenous 60 min pre-op 11/10/11 0926 11/10/11 1105   11/10/11 0930   cefOXitin (MEFOXIN) 1 g in dextrose 5 % 50 mL IVPB  Status:  Discontinued        1 g 100 mL/hr over 30 Minutes Intravenous 3 times per day 11/10/11 0924 11/10/11 0926   11/07/11 2200   cefTRIAXone (ROCEPHIN) 1 g in dextrose 5 % 50 mL IVPB  Status:  Discontinued        1 g 100 mL/hr over 30 Minutes Intravenous Every 24 hours 11/07/11 2124 11/10/11 1536   11/07/11 1830   cefTRIAXone (ROCEPHIN) 1 g in dextrose 5 % 50 mL IVPB        1 g 100 mL/hr over 30 Minutes Intravenous  Once 11/07/11 1819 11/07/11 1920

## 2011-11-26 NOTE — Progress Notes (Signed)
Calorie Count Note  Intervention: Recommend MD order bowel regimen. RD will follow up and analyze calorie count from the weekend on Monday.   48 hour calorie count ordered.  Diet: Regular  Supplements: Ensure Complete TID - pt has refused all supplements during calorie count  10/10 Breakfast: 310 calories, 3g protein Lunch: 296 calories, 14g protein Dinner: 446 calories, 38g protein  10/11 Breakfast: 439 calories, 9g protein Lunch: pt did not order   Total daily intake from 10/10: 1052 kcal (62% of minimum estimated needs)  55 protein (61% of minimum estimated needs)  - Pt reports MD told him to go easy on the food r/t pt feeling extremely bloated and constipated. Pt states because of this, he did not order any lunch today and refused Ensure. Pt states he knows he will eat better once he has a bowel movement and bloating improves. Pt's stomach appears distended.   Nutrition Dx: Inadequate oral intake r/t inability to eat AEB NPO - no longer appropriate as pt on diet.   New nutrition dx: Inadequate oral intake r/t bloating/constipation AEB <50% meal intake.   Goal: 1. Promote weight maintenance - not met, pt's weight down 15 pounds since admission, no new weights since 10/7 2. Diet advancement - met, pt on regular diet   New goals: 1. Pt to consume >90% of meals/supplements 2. Resolution of constipation   Levon Hedger MS, RD, LDN [phone] Pager [phone] After Hours Pager

## 2011-11-26 NOTE — Progress Notes (Signed)
Physical Therapy Treatment Patient Details Name: Jeffery [name] MRN: [id number] DOB: [birth year] Today's Date: 11/26/2011 Time: 4098-1191 PT Time Calculation (min): 10 min  PT Assessment / Plan / Recommendation Comments on Treatment Session  Mobilizing well. Pacing still somewhat of an issue. Improved stability with RW use. Initiated standing exercises this session for strengthening and balance.     Follow Up Recommendations  Home health PT;Supervision for mobility/OOB     Does the patient have the potential to tolerate intense rehabilitation     Barriers to Discharge        Equipment Recommendations  Rolling walker with 5" wheels    Recommendations for Other Services    Frequency Min 3X/week   Plan Discharge plan remains appropriate    Precautions / Restrictions Precautions Precautions: Fall Restrictions Weight Bearing Restrictions: No   Pertinent Vitals/Pain No c/o    Mobility  Bed Mobility Bed Mobility: Supine to Sit Supine to Sit: 7: Independent Transfers Transfers: Sit to Stand;Stand to Sit Sit to Stand: 5: Supervision Stand to Sit: 5: Supervision Details for Transfer Assistance: VCs safety Ambulation/Gait Ambulation/Gait Assistance: 4: Min guard Ambulation Distance (Feet): 500 Feet Assistive device: Rolling walker Ambulation/Gait Assistance Details: VCs pacing, distance from RW. Good gait speed.     Exercises General Exercises - Lower Extremity-with 1 hand support on RW Hip Flexion/Marching: AROM;10 reps;Standing Toe Raises: AROM;10 reps;Standing Heel Raises: AROM;10 reps;Standing Mini-Sqauts: AROM;10 reps;Standing   PT Diagnosis:    PT Problem List:   PT Treatment Interventions:     PT Goals Acute Rehab PT Goals Pt will go Supine/Side to Sit: with modified independence PT Goal: Supine/Side to Sit - Progress: Met Pt will go Sit to Stand: with modified independence PT Goal: Sit to Stand - Progress: Progressing toward goal Pt will Ambulate: >150  feet;with supervision;with least restrictive assistive device PT Goal: Ambulate - Progress: Progressing toward goal  Visit Information  Last PT Received On: 11/26/11 Assistance Needed: +1    Subjective Data  Subjective: "this is my 3rd walk today" Patient Stated Goal: Home Monday   Cognition  Overall Cognitive Status: Appears within functional limits for tasks assessed/performed Area of Impairment: Safety/judgement Arousal/Alertness: Awake/alert Orientation Level: Appears intact for tasks assessed Behavior During Session: [facility] for tasks performed Safety/Judgement: Impulsive    Balance     End of Session PT - End of Session Activity Tolerance: Patient tolerated treatment well Patient left: in bed;with call bell/phone within reach-pt requested to sit on EOB for a little while   GP     Jeffery [name] 11/26/2011, 3:36 PM [phone]

## 2011-11-27 LAB — GLUCOSE, CAPILLARY
Glucose-Capillary: 123 mg/dL — ABNORMAL HIGH (ref 70–99)
Glucose-Capillary: 138 mg/dL — ABNORMAL HIGH (ref 70–99)
Glucose-Capillary: 84 mg/dL (ref 70–99)

## 2011-11-27 NOTE — Progress Notes (Signed)
Subjective: Pt ok. No new c/o  Objective: Physical Exam: BP 125/92  Pulse 125  Temp 97.9 F (36.6 C) (Oral)  Resp 18  Ht 6\' 2"  (1.88 m)  Wt 116 lb 6.4 oz (52.799 kg)  BMI 14.94 kg/m2  SpO2 98% Rt TG drain intact, some skin irritation, no real output aside from flushes New LLQ drain intact, site clean. Still purulent output, 155cc yesterday   Labs:   Studies/Results: Dg Abd Acute W/chest  11/25/2011  *RADIOLOGY REPORT*  Clinical Data: Abdominal pain, rectal cancer with mets, small bowel resection with colostomy  ACUTE ABDOMEN SERIES (ABDOMEN 2 VIEW & CHEST 1 VIEW)  Comparison: Chest radiograph dated 11/22/2011.  CT abdomen pelvis dated 11/20/2011.  Findings: Stable pulmonary metastases in the right mid/lower lung. No pleural effusion or pneumothorax.  Cardiomediastinal silhouette is within normal limits.  Right chest power port.  Postsurgical changes with suture line in the left mid abdomen.  Two pigtail drains in the pelvis.  Left lower quadrant colostomy.  Again seen are dilated loops of small bowel in the right mid abdomen.  No evidence of free air under the diaphragm on the upright view.  IMPRESSION: Stable pulmonary metastases.  No evidence of free air.  Stable dilated loops of small bowel in the right mid abdomen, possibly reflecting adynamic ileus or partial small bowel obstruction, correlate with colostomy output.   Original Report Authenticated By: Charline Bills, M.D.     Assessment/Plan: s/p TG/LLQ abscess drainages 8/3 and 10/6 respect.  Per [facility] note, follow up CT for tomorrow--will consider removing TG drain soon if no residual fluid on next CT due to skin breakdown at insertion site    LOS: 20 days    Brayton El PA-C 11/27/2011 8:15 AM

## 2011-11-27 NOTE — Progress Notes (Signed)
Patient ID: Jeffery [name], male   DOB: [birth year], 51 y.o.   MRN: [id number] [facility] Progress Note:   17 Days Post-Op  Subjective: Mental status is clear.  Having some issues with constipation.  Calorie count in progress Objective: Vital signs in last 24 hours: Temp:  [97.8 F (36.6 C)-98 F (36.7 C)] 97.9 F (36.6 C) (10/12 0552) Pulse Rate:  [92-125] 125  (10/12 0552) Resp:  [16-18] 18  (10/12 0552) BP: (125-137)/(82-92) 132/89 mmHg (10/12 0949) SpO2:  [97 %-100 %] 98 % (10/12 0552) Weight:  [116 lb 2.9 oz (52.7 kg)-116 lb 6.4 oz (52.799 kg)] 116 lb 6.4 oz (52.799 kg) (10/12 0552)  Intake/Output from previous day: 10/11 0701 - 10/12 0700 In: 1460 [P.O.:1080; I.V.:300] Out: 1005 [Urine:850; Drains:155] Intake/Output this shift: Total I/O In: 480 [P.O.:480] Out: 180 [Urine:100; Drains:80]  Physical Exam: Work of breathing is normal.  No abdominal pain  Lab Results:  Results for orders placed during the hospital encounter of 11/07/11 (from the past 48 hour(s))  GLUCOSE, CAPILLARY     Status: Normal   Collection Time   11/25/11 11:46 AM      Component Value Range Comment   Glucose-Capillary 89  70 - 99 mg/dL   GLUCOSE, CAPILLARY     Status: Abnormal   Collection Time   11/25/11  4:33 PM      Component Value Range Comment   Glucose-Capillary 125 (*) 70 - 99 mg/dL   GLUCOSE, CAPILLARY     Status: Abnormal   Collection Time   11/25/11  8:10 PM      Component Value Range Comment   Glucose-Capillary 132 (*) 70 - 99 mg/dL   GLUCOSE, CAPILLARY     Status: Abnormal   Collection Time   11/25/11 11:47 PM      Component Value Range Comment   Glucose-Capillary 111 (*) 70 - 99 mg/dL   GLUCOSE, CAPILLARY     Status: Abnormal   Collection Time   11/26/11  3:11 AM      Component Value Range Comment   Glucose-Capillary 131 (*) 70 - 99 mg/dL   GLUCOSE, CAPILLARY     Status: Abnormal   Collection Time   11/26/11  7:55 AM      Component Value Range Comment   Glucose-Capillary 118 (*) 70 - 99 mg/dL   GLUCOSE, CAPILLARY     Status: Abnormal   Collection Time   11/26/11 12:36 PM      Component Value Range Comment   Glucose-Capillary 141 (*) 70 - 99 mg/dL   GLUCOSE, CAPILLARY     Status: Abnormal   Collection Time   11/26/11  5:09 PM      Component Value Range Comment   Glucose-Capillary 118 (*) 70 - 99 mg/dL   GLUCOSE, CAPILLARY     Status: Abnormal   Collection Time   11/26/11  7:56 PM      Component Value Range Comment   Glucose-Capillary 137 (*) 70 - 99 mg/dL   GLUCOSE, CAPILLARY     Status: Abnormal   Collection Time   11/26/11 11:31 PM      Component Value Range Comment   Glucose-Capillary 115 (*) 70 - 99 mg/dL   GLUCOSE, CAPILLARY     Status: Abnormal   Collection Time   11/27/11  3:49 AM      Component Value Range Comment   Glucose-Capillary 138 (*) 70 - 99 mg/dL   GLUCOSE, CAPILLARY     Status: Abnormal  Collection Time   11/27/11  8:17 AM      Component Value Range Comment   Glucose-Capillary 193 (*) 70 - 99 mg/dL     Radiology/Results: No results found.  Anti-infectives: Anti-infectives     Start     Dose/Rate Route Frequency Ordered Stop   11/22/11 2000   imipenem-cilastatin (PRIMAXIN) 250 mg in sodium chloride 0.9 % 100 mL IVPB        250 mg 200 mL/hr over 30 Minutes Intravenous Every 6 hours 11/22/11 1423 11/30/11 0816   11/22/11 1430   imipenem-cilastatin (PRIMAXIN) 250 mg in sodium chloride 0.9 % 100 mL IVPB        250 mg 200 mL/hr over 30 Minutes Intravenous NOW 11/22/11 1423 11/22/11 1545   11/17/11 1400   vancomycin (VANCOCIN) IVPB 1000 mg/200 mL premix  Status:  Discontinued        1,000 mg 200 mL/hr over 60 Minutes Intravenous Every 8 hours 11/17/11 0802 11/19/11 0934   11/15/11 1400   imipenem-cilastatin (PRIMAXIN) 500 mg in sodium chloride 0.9 % 100 mL IVPB  Status:  Discontinued        500 mg 200 mL/hr over 30 Minutes Intravenous 3 times per day 11/15/11 0931 11/22/11 1423   11/15/11 0615    vancomycin (VANCOCIN) 750 mg in sodium chloride 0.9 % 150 mL IVPB  Status:  Discontinued        750 mg 150 mL/hr over 60 Minutes Intravenous 3 times per day 11/15/11 0608 11/17/11 0801   11/14/11 1800   imipenem-cilastatin (PRIMAXIN) 250 mg in sodium chloride 0.9 % 100 mL IVPB  Status:  Discontinued        250 mg 200 mL/hr over 30 Minutes Intravenous 4 times per day 11/14/11 1434 11/15/11 0931   11/13/11 1800   vancomycin (VANCOCIN) 750 mg in sodium chloride 0.9 % 150 mL IVPB  Status:  Discontinued        750 mg 150 mL/hr over 60 Minutes Intravenous Every 12 hours 11/13/11 0631 11/15/11 0608   11/13/11 0645   vancomycin (VANCOCIN) 750 mg in sodium chloride 0.9 % 150 mL IVPB        750 mg 150 mL/hr over 60 Minutes Intravenous  Once 11/13/11 0631 11/13/11 0738   11/12/11 1200   imipenem-cilastatin (PRIMAXIN) 500 mg in sodium chloride 0.9 % 100 mL IVPB  Status:  Discontinued        500 mg 200 mL/hr over 30 Minutes Intravenous 3 times per day 11/12/11 1015 11/14/11 1434   11/11/11 2000   micafungin (MYCAMINE) 100 mg in sodium chloride 0.9 % 100 mL IVPB     Comments: stat      100 mg 100 mL/hr over 1 Hours Intravenous Every 24 hours 11/10/11 1939 11/30/11 0816   11/11/11 1800   vancomycin (VANCOCIN) 500 mg in sodium chloride 0.9 % 100 mL IVPB  Status:  Discontinued        500 mg 100 mL/hr over 60 Minutes Intravenous Every 12 hours 11/11/11 1014 11/13/11 0631   11/10/11 2000   micafungin (MYCAMINE) 100 mg in sodium chloride 0.9 % 100 mL IVPB     Comments: stat      100 mg 100 mL/hr over 1 Hours Intravenous Daily 11/10/11 1939 11/10/11 2215   11/10/11 2000   imipenem-cilastatin (PRIMAXIN) 250 mg in sodium chloride 0.9 % 100 mL IVPB  Status:  Discontinued        250 mg 200 mL/hr over 30 Minutes Intravenous 4  times per day 11/10/11 1951 11/12/11 1014   11/10/11 1630   fluconazole (DIFLUCAN) IVPB 200 mg  Status:  Discontinued        200 mg 100 mL/hr over 60 Minutes Intravenous Every 24  hours 11/10/11 1617 11/10/11 1939   11/10/11 1630   vancomycin (VANCOCIN) 500 mg in sodium chloride 0.9 % 100 mL IVPB  Status:  Discontinued        500 mg 100 mL/hr over 60 Minutes Intravenous 3 times per day 11/10/11 1617 11/11/11 1014   11/10/11 1630   piperacillin-tazobactam (ZOSYN) IVPB 3.375 g  Status:  Discontinued        3.375 g 12.5 mL/hr over 240 Minutes Intravenous 3 times per day 11/10/11 1617 11/10/11 1941   11/10/11 0933   cefOXitin (MEFOXIN) 2 g in dextrose 5 % 50 mL IVPB        2 g 100 mL/hr over 30 Minutes Intravenous 60 min pre-op 11/10/11 0926 11/10/11 1105   11/10/11 0930   cefOXitin (MEFOXIN) 1 g in dextrose 5 % 50 mL IVPB  Status:  Discontinued        1 g 100 mL/hr over 30 Minutes Intravenous 3 times per day 11/10/11 0924 11/10/11 0926   11/07/11 2200   cefTRIAXone (ROCEPHIN) 1 g in dextrose 5 % 50 mL IVPB  Status:  Discontinued        1 g 100 mL/hr over 30 Minutes Intravenous Every 24 hours 11/07/11 2124 11/10/11 1536   11/07/11 1830   cefTRIAXone (ROCEPHIN) 1 g in dextrose 5 % 50 mL IVPB        1 g 100 mL/hr over 30 Minutes Intravenous  Once 11/07/11 1819 11/07/11 1920          Assessment/Plan: Problem List: Patient Active Problem List  Diagnosis  . ADENOCARCINOMA, RECTUM, [id number]  . SBO (small bowel obstruction), closed loop - s/p SB resection 11/10/2011  . Hypertension  . Tobacco abuse  . 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 Acute respiratory failure following trauma and surgery  . Peritonitis  . Aspiration pneumonia  . Hypotension  . Septic shock(785.52)  . Severe sepsis(995.92)  . Acute renal insufficiency  . Protein calorie malnutrition  . Abscess of male pelvis    Nutritional needs assessment in progress.  Acute problems appear to be resolving but with looming cancer issues.  17 Days Post-Op    LOS: 20 days   Matt B. Daphine Deutscher, MD, [facility] [phone] beeper [phone]  11/27/2011 11:32 AM

## 2011-11-27 NOTE — Progress Notes (Signed)
OT Note:  Pt in too much pain this pm for OT.  Will check another time.  [place], OTR/L [phone] 11/27/2011

## 2011-11-28 ENCOUNTER — Encounter (HOSPITAL_COMMUNITY): Payer: Self-pay | Admitting: Radiology

## 2011-11-28 ENCOUNTER — Inpatient Hospital Stay (HOSPITAL_COMMUNITY): Payer: Medicaid Other

## 2011-11-28 LAB — GLUCOSE, CAPILLARY
Glucose-Capillary: 126 mg/dL — ABNORMAL HIGH (ref 70–99)
Glucose-Capillary: 93 mg/dL (ref 70–99)
Glucose-Capillary: 107 mg/dL — ABNORMAL HIGH (ref 70–99)
Glucose-Capillary: 165 mg/dL — ABNORMAL HIGH (ref 70–99)

## 2011-11-28 MED ORDER — IOHEXOL 300 MG/ML  SOLN
100.0000 mL | Freq: Once | INTRAMUSCULAR | Status: AC | PRN
  Administered 2011-11-28: 100 mL via INTRAVENOUS

## 2011-11-28 NOTE — Progress Notes (Signed)
Calorie Count Note  48 hour calorie count  Diet: Regular Supplements: Ensure Complete TID  10/12 Breakfast: Meal ticket saved, but amounts not recorded Lunch: 697 calories, 25 g protein Dinner: No meal ticket  10/13 Breakfast: 335 kcal, 7 g protein  Unable to determine daily intake due to lack of saved meal tickets. Patient still c/o constipation and fullness, but appetite has improved slightly.   Goals:  1. Pt to consume >90% of meals/supplements, not met 2. Resolution of constipation, not met  Linnell Fulling, RD, LDN Pager #: [phone] After-Hours Pager #: [phone]

## 2011-11-28 NOTE — Progress Notes (Signed)
Patient ID: Jeffery [name], male   DOB: [birth year], 51 y.o.   MRN: [id number] [facility] Progress Note:   18 Days Post-Op  Subjective: Mental status is clear.  Sitting up in chair and waiting for CT scan.  Has drunk all of the contrast.   Objective: Vital signs in last 24 hours: Temp:  [98.2 F (36.8 C)-100 F (37.8 C)] 98.7 F (37.1 C) (10/13 0500) Pulse Rate:  [72-105] 105  (10/13 0500) Resp:  [18] 18  (10/13 0500) BP: (132-144)/(87-96) 144/96 mmHg (10/13 0500) SpO2:  [97 %-99 %] 97 % (10/13 0500)  Intake/Output from previous day: 10/12 0701 - 10/13 0700 In: 2518 [P.O.:960; I.V.:488; IV Piggyback:1060] Out: 1855 [Urine:1650; Drains:205] Intake/Output this shift:    Physical Exam: Work of breathing is normal.  Appears in no distress or pain.    Lab Results:  Results for orders placed during the hospital encounter of 11/07/11 (from the past 48 hour(s))  GLUCOSE, CAPILLARY     Status: Abnormal   Collection Time   11/26/11 12:36 PM      Component Value Range Comment   Glucose-Capillary 141 (*) 70 - 99 mg/dL   GLUCOSE, CAPILLARY     Status: Abnormal   Collection Time   11/26/11  5:09 PM      Component Value Range Comment   Glucose-Capillary 118 (*) 70 - 99 mg/dL   GLUCOSE, CAPILLARY     Status: Abnormal   Collection Time   11/26/11  7:56 PM      Component Value Range Comment   Glucose-Capillary 137 (*) 70 - 99 mg/dL   GLUCOSE, CAPILLARY     Status: Abnormal   Collection Time   11/26/11 11:31 PM      Component Value Range Comment   Glucose-Capillary 115 (*) 70 - 99 mg/dL   GLUCOSE, CAPILLARY     Status: Abnormal   Collection Time   11/27/11  3:49 AM      Component Value Range Comment   Glucose-Capillary 138 (*) 70 - 99 mg/dL   GLUCOSE, CAPILLARY     Status: Abnormal   Collection Time   11/27/11  8:17 AM      Component Value Range Comment   Glucose-Capillary 193 (*) 70 - 99 mg/dL   GLUCOSE, CAPILLARY     Status: Normal   Collection Time   11/27/11 12:17  PM      Component Value Range Comment   Glucose-Capillary 98  70 - 99 mg/dL   GLUCOSE, CAPILLARY     Status: Abnormal   Collection Time   11/27/11  4:25 PM      Component Value Range Comment   Glucose-Capillary 237 (*) 70 - 99 mg/dL    Comment 1 Notify RN     GLUCOSE, CAPILLARY     Status: Normal   Collection Time   11/27/11  7:48 PM      Component Value Range Comment   Glucose-Capillary 84  70 - 99 mg/dL    Comment 1 Notify RN     GLUCOSE, CAPILLARY     Status: Abnormal   Collection Time   11/27/11 11:58 PM      Component Value Range Comment   Glucose-Capillary 123 (*) 70 - 99 mg/dL   GLUCOSE, CAPILLARY     Status: Abnormal   Collection Time   11/28/11  4:02 AM      Component Value Range Comment   Glucose-Capillary 126 (*) 70 - 99 mg/dL   GLUCOSE, CAPILLARY  Status: Normal   Collection Time   11/28/11  7:48 AM      Component Value Range Comment   Glucose-Capillary 93  70 - 99 mg/dL     Radiology/Results: No results found.  Anti-infectives: Anti-infectives     Start     Dose/Rate Route Frequency Ordered Stop   11/22/11 2000   imipenem-cilastatin (PRIMAXIN) 250 mg in sodium chloride 0.9 % 100 mL IVPB        250 mg 200 mL/hr over 30 Minutes Intravenous Every 6 hours 11/22/11 1423 11/30/11 0816   11/22/11 1430   imipenem-cilastatin (PRIMAXIN) 250 mg in sodium chloride 0.9 % 100 mL IVPB        250 mg 200 mL/hr over 30 Minutes Intravenous NOW 11/22/11 1423 11/22/11 1545   11/17/11 1400   vancomycin (VANCOCIN) IVPB 1000 mg/200 mL premix  Status:  Discontinued        1,000 mg 200 mL/hr over 60 Minutes Intravenous Every 8 hours 11/17/11 0802 11/19/11 0934   11/15/11 1400   imipenem-cilastatin (PRIMAXIN) 500 mg in sodium chloride 0.9 % 100 mL IVPB  Status:  Discontinued        500 mg 200 mL/hr over 30 Minutes Intravenous 3 times per day 11/15/11 0931 11/22/11 1423   11/15/11 0615   vancomycin (VANCOCIN) 750 mg in sodium chloride 0.9 % 150 mL IVPB  Status:  Discontinued         750 mg 150 mL/hr over 60 Minutes Intravenous 3 times per day 11/15/11 0608 11/17/11 0801   11/14/11 1800   imipenem-cilastatin (PRIMAXIN) 250 mg in sodium chloride 0.9 % 100 mL IVPB  Status:  Discontinued        250 mg 200 mL/hr over 30 Minutes Intravenous 4 times per day 11/14/11 1434 11/15/11 0931   11/13/11 1800   vancomycin (VANCOCIN) 750 mg in sodium chloride 0.9 % 150 mL IVPB  Status:  Discontinued        750 mg 150 mL/hr over 60 Minutes Intravenous Every 12 hours 11/13/11 0631 11/15/11 0608   11/13/11 0645   vancomycin (VANCOCIN) 750 mg in sodium chloride 0.9 % 150 mL IVPB        750 mg 150 mL/hr over 60 Minutes Intravenous  Once 11/13/11 0631 11/13/11 0738   11/12/11 1200   imipenem-cilastatin (PRIMAXIN) 500 mg in sodium chloride 0.9 % 100 mL IVPB  Status:  Discontinued        500 mg 200 mL/hr over 30 Minutes Intravenous 3 times per day 11/12/11 1015 11/14/11 1434   11/11/11 2000   micafungin (MYCAMINE) 100 mg in sodium chloride 0.9 % 100 mL IVPB     Comments: stat      100 mg 100 mL/hr over 1 Hours Intravenous Every 24 hours 11/10/11 1939 11/30/11 0816   11/11/11 1800   vancomycin (VANCOCIN) 500 mg in sodium chloride 0.9 % 100 mL IVPB  Status:  Discontinued        500 mg 100 mL/hr over 60 Minutes Intravenous Every 12 hours 11/11/11 1014 11/13/11 0631   11/10/11 2000   micafungin (MYCAMINE) 100 mg in sodium chloride 0.9 % 100 mL IVPB     Comments: stat      100 mg 100 mL/hr over 1 Hours Intravenous Daily 11/10/11 1939 11/10/11 2215   11/10/11 2000   imipenem-cilastatin (PRIMAXIN) 250 mg in sodium chloride 0.9 % 100 mL IVPB  Status:  Discontinued        250 mg 200 mL/hr over  30 Minutes Intravenous 4 times per day 11/10/11 1951 11/12/11 1014   11/10/11 1630   fluconazole (DIFLUCAN) IVPB 200 mg  Status:  Discontinued        200 mg 100 mL/hr over 60 Minutes Intravenous Every 24 hours 11/10/11 1617 11/10/11 1939   11/10/11 1630   vancomycin (VANCOCIN) 500 mg in  sodium chloride 0.9 % 100 mL IVPB  Status:  Discontinued        500 mg 100 mL/hr over 60 Minutes Intravenous 3 times per day 11/10/11 1617 11/11/11 1014   11/10/11 1630   piperacillin-tazobactam (ZOSYN) IVPB 3.375 g  Status:  Discontinued        3.375 g 12.5 mL/hr over 240 Minutes Intravenous 3 times per day 11/10/11 1617 11/10/11 1941   11/10/11 0933   cefOXitin (MEFOXIN) 2 g in dextrose 5 % 50 mL IVPB        2 g 100 mL/hr over 30 Minutes Intravenous 60 min pre-op 11/10/11 0926 11/10/11 1105   11/10/11 0930   cefOXitin (MEFOXIN) 1 g in dextrose 5 % 50 mL IVPB  Status:  Discontinued        1 g 100 mL/hr over 30 Minutes Intravenous 3 times per day 11/10/11 0924 11/10/11 0926   11/07/11 2200   cefTRIAXone (ROCEPHIN) 1 g in dextrose 5 % 50 mL IVPB  Status:  Discontinued        1 g 100 mL/hr over 30 Minutes Intravenous Every 24 hours 11/07/11 2124 11/10/11 1536   11/07/11 1830   cefTRIAXone (ROCEPHIN) 1 g in dextrose 5 % 50 mL IVPB        1 g 100 mL/hr over 30 Minutes Intravenous  Once 11/07/11 1819 11/07/11 1920          Assessment/Plan: Problem List: Patient Active Problem List  Diagnosis  . ADENOCARCINOMA, RECTUM, [id number]  . SBO (small bowel obstruction), closed loop - s/p SB resection 11/10/2011  . Hypertension  . Tobacco abuse  . 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 Acute respiratory failure following trauma and surgery  . Peritonitis  . Aspiration pneumonia  . Hypotension  . Septic shock(785.52)  . Severe sepsis(995.92)  . Acute renal insufficiency  . Protein calorie malnutrition  . Abscess of male pelvis    Await results of CT scan.   18 Days Post-Op    LOS: 21 days   Matt B. Daphine Deutscher, MD, [facility] [phone] beeper [phone]  11/28/2011 9:26 AM

## 2011-11-28 NOTE — H&P (Signed)
Chief Complaint: Pelvic abscesses Referring Physician:Rosenbower/Byerly HPI: Jeffery [name] is an 51 y.o. male.  Please see previous H&P. Follow up CT scan shows interval improvement of (L)sided abscess but new large 10cm RLQ/pelvic abscess. Transgluteal drain is retracted into gluteus muscle. Pt main c/o is constipation and abd fullness. Request for additional drainage of new abscess.  Past Medical History:  Past Medical History  Diagnosis Date  . Hypertension     no meds  . Cancer     rectal   . 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 Multiple tooth extractions     9/11  . Portacath placement 04/12/2011    Procedure: INSERTION PORT-A-CATH;  Surgeon: Almond Lint, MD;  Location: [facility];  Service: General;  Laterality: N/A;  port a cath insertion  . Colon surgery 12/11    sbo  . Colon surgery 8/12    colon resection-iliostomy  . Colon surgery 2012    iliostomy takedown  . Colostomy 10/05/2011    Procedure: COLOSTOMY;  Surgeon: Almond Lint, MD;  Location: [facility];  Service: General;  Laterality: N/A;  open colostomy  . Examination under anesthesia 10/05/2011    Procedure: EXAM UNDER ANESTHESIA;  Surgeon: Almond Lint, MD;  Location: [facility];  Service: General;  Laterality: N/A;  exam under anesthesia  . Proctoscopy 10/05/2011    Procedure: PROCTOSCOPY;  Surgeon: Almond Lint, MD;  Location: [facility];  Service: General;  Laterality: N/A;  rigid proctoscopy  . Laparoscopy 10/05/2011    Procedure: LAPAROSCOPY DIAGNOSTIC;  Surgeon: Almond Lint, MD;  Location: [facility];  Service: General;  Laterality: N/A;  . Laparotomy 11/10/2011    Procedure: EXPLORATORY LAPAROTOMY;  Surgeon: Almond Lint, MD;  Location: [facility];  Service: General;  Laterality: N/A;  SMALL BOWEL RESECTION x 2  . Application of  wound vac 11/10/2011    Procedure: APPLICATION OF WOUND VAC;  Surgeon: Almond Lint, MD;  Location: [facility];  Service: General;;    Family History:  Family History  Problem Relation Age of Onset  . Cancer Mother     lung    Social History:  reports that he has been smoking Cigarettes.  He has a 3 pack-year smoking history. He has never used smokeless tobacco. He reports that he drinks alcohol. He reports that he does not use illicit drugs.  Allergies: No Known Allergies  Medications: Medications Prior to Admission  Medication Sig Dispense Refill  . amLODipine (NORVASC) 5 MG tablet Take 2 tablets (10 mg total) by mouth daily.  30 tablet  3  . lidocaine-prilocaine (EMLA) cream Apply topically as needed. Apply to [facility] site 1-2 hours prior to stick and cover with plastic wrap  30 g  prn  . oxyCODONE-acetaminophen (PERCOCET) 10-325 MG per tablet Take 1-2 tablets by mouth every 4 (four) hours as needed for pain.  70 tablet  0  . PRESCRIPTION MEDICATION Pt's a chemo patient and gets chemo at [facility]. Last treatment was 2 months ago. Pt states his next scheduled chemo is in September 2013. Followed by Dr Truett Perna        Please HPI for pertinent positives, otherwise complete 10 system ROS negative.  Physical Exam: Blood pressure 144/96, pulse 105, temperature 98.7 F (37.1 C), temperature source Oral, resp. rate 18, height 6\' 2"  (  1.88 m), weight 116 lb 6.4 oz (52.799 kg), SpO2 97.00%. Body mass index is 14.94 kg/(m^2).   General Appearance:  Alert, cooperative, no distress, appears stated age  Head:  Normocephalic, without obvious abnormality, atraumatic  ENT: Unremarkable  Neck: Supple, symmetrical, trachea midline, no adenopathy, thyroid: not enlarged, symmetric, no tenderness/mass/nodules  Lungs:   Clear to auscultation bilaterally, no w/r/r, respirations unlabored without use of accessory muscles.  Heart:  Regular rate and rhythm, S1, S2 normal, no murmur, rub or gallop. Carotids 2+ without  bruit.  Abdomen:   Soft, but distended. LLQ drain intact, purulent output TG drain removed, open wound with full thickness skin breakdown remains.   Results for orders placed during the hospital encounter of 11/07/11 (from the past 48 hour(s))  GLUCOSE, CAPILLARY     Status: Abnormal   Collection Time   11/26/11 12:36 PM      Component Value Range Comment   Glucose-Capillary 141 (*) 70 - 99 mg/dL   GLUCOSE, CAPILLARY     Status: Abnormal   Collection Time   11/26/11  5:09 PM      Component Value Range Comment   Glucose-Capillary 118 (*) 70 - 99 mg/dL   GLUCOSE, CAPILLARY     Status: Abnormal   Collection Time   11/26/11  7:56 PM      Component Value Range Comment   Glucose-Capillary 137 (*) 70 - 99 mg/dL   GLUCOSE, CAPILLARY     Status: Abnormal   Collection Time   11/26/11 11:31 PM      Component Value Range Comment   Glucose-Capillary 115 (*) 70 - 99 mg/dL   GLUCOSE, CAPILLARY     Status: Abnormal   Collection Time   11/27/11  3:49 AM      Component Value Range Comment   Glucose-Capillary 138 (*) 70 - 99 mg/dL   GLUCOSE, CAPILLARY     Status: Abnormal   Collection Time   11/27/11  8:17 AM      Component Value Range Comment   Glucose-Capillary 193 (*) 70 - 99 mg/dL   GLUCOSE, CAPILLARY     Status: Normal   Collection Time   11/27/11 12:17 PM      Component Value Range Comment   Glucose-Capillary 98  70 - 99 mg/dL   GLUCOSE, CAPILLARY     Status: Abnormal   Collection Time   11/27/11  4:25 PM      Component Value Range Comment   Glucose-Capillary 237 (*) 70 - 99 mg/dL    Comment 1 Notify RN     GLUCOSE, CAPILLARY     Status: Normal   Collection Time   11/27/11  7:48 PM      Component Value Range Comment   Glucose-Capillary 84  70 - 99 mg/dL    Comment 1 Notify RN     GLUCOSE, CAPILLARY     Status: Abnormal   Collection Time   11/27/11 11:58 PM      Component Value Range Comment   Glucose-Capillary 123 (*) 70 - 99 mg/dL   GLUCOSE, CAPILLARY     Status: Abnormal    Collection Time   11/28/11  4:02 AM      Component Value Range Comment   Glucose-Capillary 126 (*) 70 - 99 mg/dL   GLUCOSE, CAPILLARY     Status: Normal   Collection Time   11/28/11  7:48 AM      Component Value Range Comment   Glucose-Capillary 93  70 - 99  mg/dL    Ct Abdomen Pelvis W Contrast  11/28/2011  *RADIOLOGY REPORT*  Clinical Data: Rectal adenocarcinoma.  Recent bowel surgery. Follow-up abdominal and pelvic abscesses.  CT ABDOMEN AND PELVIS WITH CONTRAST  Technique:  Multidetector CT imaging of the abdomen and pelvis was performed following the standard protocol during bolus administration of intravenous contrast.  Contrast: OMNIPAQUE IOHEXOL 300 MG/ML  SOLN  Comparison: 11/20/2011  Findings: A new left lower quadrant percutaneous drainage catheter is seen just deep to the rectus abdominis muscles. There is been near complete resolution of the anterior pelvic fluid collection seen in this region. There has also been interval decrease in the size of a communicating fluid collection in the anterior left abdomen extending into the left subphrenic space.  A persistent, separately loculated fluid and gas collection is seen in the right lower quadrant in the upper pelvis which measures 4.8 x 9.8 cm.  Adjacent anastomotic staples are noted in the right lower quadrant.  A right transgluteal drainage catheter has been pulled back into the gluteus maximus muscle, and there has been reappearance of a small fluid and tail gas collection in the right perirectal region measuring 2.0 x 3.1 cm.  Diffuse dilatation of small bowel and colonic distention is again demonstrated, consistent with ileus.  No evidence of bowel obstruction.  Left lower quadrant colostomy again noted with orally administered contrast reaching the colostomy site.  Mild right hydronephrosis is seen which is increased since previous study and is likely related to the right lower quadrant fluid collection and associated inflammatory  changes. A small right renal cyst is again noted.  The abdominal parenchymal organs are otherwise unremarkable appearance.  Gallbladder appears normal.  Images of the lung bases again show diffuse bilateral pulmonary metastases.  Small left pleural effusion and pericardial effusion are mildly increased in size.  IMPRESSION:  1.  Near complete resolution of left lower quadrant fluid collection, and decreased size of communicating left anterior abdominal and left subphrenic collections following drainage catheter placement. 2.  Persistent 5 x 10 cm fluid and gas collection in the right lower quadrant and upper pelvis, consistent with a separately loculated fluid collection or abscess. 3.  Right transgluteal drainage catheter has been pulled back into the gluteus muscle, with reappearance of 2 x 3 cm fluid and gas collection in the right perirectal region. 4.  Increased mild right hydronephrosis. 5.  Persistent adynamic ileus. 6.  Increased small left pleural effusion and pericardial effusion. 7.  Stable bibasilar pulmonary metastases.   Original Report Authenticated By: Danae Orleans, M.D.     Assessment/Plan Hx rectal ca  SBO - resection 11/10/11  CT shows new abscess in right pelvis  Scheduled for abd/pelvic abscess drain placement  Check labs in am. Aware of procedure benefits and risks and agreeable to proceed.  Consent to be signed and in chart   Brayton El PA-C 11/28/2011, 11:37 AM

## 2011-11-28 NOTE — Progress Notes (Signed)
ANTIBIOTIC CONSULT NOTE - FOLLOW UP  Pharmacy Consult for Primaxin Indication: peritonitis, PNA, buttock and pelvic abscess  No Known Allergies  Patient Measurements: Height: 6\' 2"  (188 cm) Weight: 116 lb 6.4 oz (52.799 kg) [name] : 82.2   Vital Signs: Temp: 98.7 F (37.1 C) (10/13 0500) Temp src: Oral (10/13 0500) BP: 144/96 mmHg (10/13 0500) Pulse Rate: 105  (10/13 0500) Intake/Output from previous day: 10/12 0701 - 10/13 0700 In: 2518 [P.O.:960; I.V.:488; IV Piggyback:1060] Out: 1855 [Urine:1650; Drains:205] Intake/Output from this shift: Total I/O In: 0  Out: 225 [Urine:200; Drains:25]  Labs: No results found for this basename: WBC:3,HGB:3,PLT:3,LABCREA:3,CREATININE:3, in the last 72 hours Estimated Creatinine Clearance: 80.7 ml/min (by C-G formula based on Cr of 0.61).   Assessment:  60 yom with h/o recurrent metastatic rectal cancer underwent exploratory laparotomy, small bowel resection x2, incidental appendectomy 9/25 - thought to have aspirated in PACU post operatively with feculent emesis reported. Abx started for peritonitis, aspiration PNA and new pelvic and buttock abscess s/p perc drainage of peritoneal abscess/fluid by IR on 10/6. CT today shows new abscess, to have drain placed by IR  Today is D#19 Primaxin, Micafungin.    Plan per [facility] is to continue abx 7 days after last drain (both currently scheduled to end 10/15)  Primaxin dose remains appropriate for weight and renal function.  Plan:   Continue Primaxin to 250 mg Iv q6h.  Pharmacy will f/u  Gwen Her PharmD  [phone] 11/28/2011 12:43 PM

## 2011-11-29 ENCOUNTER — Encounter (HOSPITAL_COMMUNITY): Payer: Self-pay | Admitting: Radiology

## 2011-11-29 ENCOUNTER — Inpatient Hospital Stay (HOSPITAL_COMMUNITY): Payer: Medicaid Other

## 2011-11-29 LAB — GLUCOSE, CAPILLARY
Glucose-Capillary: 100 mg/dL — ABNORMAL HIGH (ref 70–99)
Glucose-Capillary: 107 mg/dL — ABNORMAL HIGH (ref 70–99)
Glucose-Capillary: 116 mg/dL — ABNORMAL HIGH (ref 70–99)
Glucose-Capillary: 133 mg/dL — ABNORMAL HIGH (ref 70–99)
Glucose-Capillary: 159 mg/dL — ABNORMAL HIGH (ref 70–99)

## 2011-11-29 LAB — COMPREHENSIVE METABOLIC PANEL
BUN: 5 mg/dL — ABNORMAL LOW (ref 6–23)
CO2: 27 mEq/L (ref 19–32)
Calcium: 7.5 mg/dL — ABNORMAL LOW (ref 8.4–10.5)
Creatinine, Ser: 0.55 mg/dL (ref 0.50–1.35)
GFR calc Af Amer: >90 mL/min (ref >90)
GFR calc non Af Amer: >90 mL/min (ref >90)
Glucose, Bld: 102 mg/dL — ABNORMAL HIGH (ref 70–99)

## 2011-11-29 LAB — CBC WITH DIFFERENTIAL/PLATELET
Basophils Relative: 0 % (ref 0–1)
Eosinophils Relative: 0 % (ref 0–5)
Hemoglobin: 7.4 g/dL — ABNORMAL LOW (ref 13.0–17.0)
Lymphocytes Relative: 11 % — ABNORMAL LOW (ref 12–46)
Neutrophils Relative %: 74 % (ref 43–77)
RBC: 2.68 MIL/uL — ABNORMAL LOW (ref 4.22–5.81)
WBC: 8.5 10*3/uL (ref 4.0–10.5)

## 2011-11-29 LAB — PROTIME-INR
INR: 1.2 (ref 0.00–1.49)
Prothrombin Time: 15 seconds (ref 11.6–15.2)

## 2011-11-29 MED ORDER — FENTANYL CITRATE 0.05 MG/ML IJ SOLN
INTRAMUSCULAR | Status: AC | PRN
  Administered 2011-11-29 (×2): 100 ug via INTRAVENOUS

## 2011-11-29 MED ORDER — MIDAZOLAM HCL 2 MG/2ML IJ SOLN
INTRAMUSCULAR | Status: AC | PRN
  Administered 2011-11-29 (×3): 1 mg via INTRAVENOUS

## 2011-11-29 NOTE — Progress Notes (Signed)
Patient ID: Jeffery [name], male   DOB: 1960/09/30, 51 y.o.   MRN: [id number]  [facility] Surgery Progress Note  Subjective: CT demonstrated abscess.  Was NPO after midnight.  Started having some stool.    Objective: Vital signs in last 24 hours: Temp:  [98 F (36.7 C)-99.3 F (37.4 C)] 98 F (36.7 C) (10/14 0430) Pulse Rate:  [106-110] 106  (10/14 0430) Resp:  [18] 18  (10/14 0430) BP: (133-148)/(83-95) 148/88 mmHg (10/14 0430) SpO2:  [96 %-99 %] 97 % (10/14 0430) Weight:  [117 lb 9.6 oz (53.343 kg)] 117 lb 9.6 oz (53.343 kg) (10/14 0600)  Intake/Output from previous day: 10/13 0701 - 10/14 0700 In: 1172 [P.O.:180; I.V.:452; IV Piggyback:500] Out: 2245 [Urine:2100; Drains:145] Intake/Output this shift:    Physical Exam: Work of breathing is normal.  Appears in no distress or pain.   Abd soft, sl distended.  Stool, gas in bag.    Lab Results:  Results for orders placed during the hospital encounter of 11/07/11 (from the past 48 hour(s))  GLUCOSE, CAPILLARY     Status: Normal   Collection Time   11/27/11 12:17 PM      Component Value Range Comment   Glucose-Capillary 98  70 - 99 mg/dL   GLUCOSE, CAPILLARY     Status: Abnormal   Collection Time   11/27/11  4:25 PM      Component Value Range Comment   Glucose-Capillary 237 (*) 70 - 99 mg/dL    Comment 1 Notify RN     GLUCOSE, CAPILLARY     Status: Normal   Collection Time   11/27/11  7:48 PM      Component Value Range Comment   Glucose-Capillary 84  70 - 99 mg/dL    Comment 1 Notify RN     GLUCOSE, CAPILLARY     Status: Abnormal   Collection Time   11/27/11 11:58 PM      Component Value Range Comment   Glucose-Capillary 123 (*) 70 - 99 mg/dL   GLUCOSE, CAPILLARY     Status: Abnormal   Collection Time   11/28/11  4:02 AM      Component Value Range Comment   Glucose-Capillary 126 (*) 70 - 99 mg/dL   GLUCOSE, CAPILLARY     Status: Normal   Collection Time   11/28/11  7:48 AM      Component Value Range Comment    Glucose-Capillary 93  70 - 99 mg/dL   GLUCOSE, CAPILLARY     Status: Abnormal   Collection Time   11/28/11 12:29 PM      Component Value Range Comment   Glucose-Capillary 107 (*) 70 - 99 mg/dL   GLUCOSE, CAPILLARY     Status: Abnormal   Collection Time   11/28/11  4:13 PM      Component Value Range Comment   Glucose-Capillary 165 (*) 70 - 99 mg/dL   GLUCOSE, CAPILLARY     Status: Normal   Collection Time   11/28/11  7:43 PM      Component Value Range Comment   Glucose-Capillary 93  70 - 99 mg/dL   GLUCOSE, CAPILLARY     Status: Abnormal   Collection Time   11/29/11 12:03 AM      Component Value Range Comment   Glucose-Capillary 133 (*) 70 - 99 mg/dL   GLUCOSE, CAPILLARY     Status: Abnormal   Collection Time   11/29/11  4:18 AM      Component  Value Range Comment   Glucose-Capillary 107 (*) 70 - 99 mg/dL   COMPREHENSIVE METABOLIC PANEL     Status: Abnormal   Collection Time   11/29/11  6:25 AM      Component Value Range Comment   Sodium 137  135 - 145 mEq/L    Potassium 2.9 (*) 3.5 - 5.1 mEq/L    Chloride 102  96 - 112 mEq/L    CO2 27  19 - 32 mEq/L    Glucose, Bld 102 (*) 70 - 99 mg/dL    BUN 5 (*) 6 - 23 mg/dL    Creatinine, Ser 0.45  0.50 - 1.35 mg/dL    Calcium 7.5 (*) 8.4 - 10.5 mg/dL    Total Protein 7.0  6.0 - 8.3 g/dL    Albumin 1.7 (*) 3.5 - 5.2 g/dL    AST 13  0 - 37 U/L    ALT 13  0 - 53 U/L    Alkaline Phosphatase 58  39 - 117 U/L    Total Bilirubin 0.3  0.3 - 1.2 mg/dL    GFR calc non Af Amer >90  >90 mL/min    GFR calc Af Amer >90  >90 mL/min   CBC WITH DIFFERENTIAL     Status: Abnormal   Collection Time   11/29/11  6:25 AM      Component Value Range Comment   WBC 8.5  4.0 - 10.5 K/uL    RBC 2.68 (*) 4.22 - 5.81 MIL/uL    Hemoglobin 7.4 (*) 13.0 - 17.0 g/dL    HCT 40.9 (*) 81.1 - 52.0 %    MCV 85.8  78.0 - 100.0 fL    MCH 27.6  26.0 - 34.0 pg    MCHC 32.2  30.0 - 36.0 g/dL    RDW 91.4 (*) 78.2 - 15.5 %    Platelets 416 (*) 150 - 400 K/uL     Neutrophils Relative 74  43 - 77 %    Lymphocytes Relative 11 (*) 12 - 46 %    Monocytes Relative 15 (*) 3 - 12 %    Eosinophils Relative 0  0 - 5 %    Basophils Relative 0  0 - 1 %    Neutro Abs 6.3  1.7 - 7.7 K/uL    Lymphs Abs 0.9  0.7 - 4.0 K/uL    Monocytes Absolute 1.3 (*) 0.1 - 1.0 K/uL    Eosinophils Absolute 0.0  0.0 - 0.7 K/uL    Basophils Absolute 0.0  0.0 - 0.1 K/uL    RBC Morphology POLYCHROMASIA PRESENT     PROTIME-INR     Status: Normal   Collection Time   11/29/11  6:25 AM      Component Value Range Comment   Prothrombin Time 15.0  11.6 - 15.2 seconds    INR 1.20  0.00 - 1.49   GLUCOSE, CAPILLARY     Status: Abnormal   Collection Time   11/29/11  7:32 AM      Component Value Range Comment   Glucose-Capillary 116 (*) 70 - 99 mg/dL     Radiology/Results: Ct Abdomen Pelvis W Contrast  11/28/2011  *RADIOLOGY REPORT*  Clinical Data: Rectal adenocarcinoma.  Recent bowel surgery. Follow-up abdominal and pelvic abscesses.  CT ABDOMEN AND PELVIS WITH CONTRAST  Technique:  Multidetector CT imaging of the abdomen and pelvis was performed following the standard protocol during bolus administration of intravenous contrast.  Contrast: OMNIPAQUE IOHEXOL 300 MG/ML  SOLN  Comparison: 11/20/2011  Findings: A new left lower quadrant percutaneous drainage catheter is seen just deep to the rectus abdominis muscles. There is been near complete resolution of the anterior pelvic fluid collection seen in this region. There has also been interval decrease in the size of a communicating fluid collection in the anterior left abdomen extending into the left subphrenic space.  A persistent, separately loculated fluid and gas collection is seen in the right lower quadrant in the upper pelvis which measures 4.8 x 9.8 cm.  Adjacent anastomotic staples are noted in the right lower quadrant.  A right transgluteal drainage catheter has been pulled back into the gluteus maximus muscle, and there has been  reappearance of a small fluid and tail gas collection in the right perirectal region measuring 2.0 x 3.1 cm.  Diffuse dilatation of small bowel and colonic distention is again demonstrated, consistent with ileus.  No evidence of bowel obstruction.  Left lower quadrant colostomy again noted with orally administered contrast reaching the colostomy site.  Mild right hydronephrosis is seen which is increased since previous study and is likely related to the right lower quadrant fluid collection and associated inflammatory changes. A small right renal cyst is again noted.  The abdominal parenchymal organs are otherwise unremarkable appearance.  Gallbladder appears normal.  Images of the lung bases again show diffuse bilateral pulmonary metastases.  Small left pleural effusion and pericardial effusion are mildly increased in size.  IMPRESSION:  1.  Near complete resolution of left lower quadrant fluid collection, and decreased size of communicating left anterior abdominal and left subphrenic collections following drainage catheter placement. 2.  Persistent 5 x 10 cm fluid and gas collection in the right lower quadrant and upper pelvis, consistent with a separately loculated fluid collection or abscess. 3.  Right transgluteal drainage catheter has been pulled back into the gluteus muscle, with reappearance of 2 x 3 cm fluid and gas collection in the right perirectal region. 4.  Increased mild right hydronephrosis. 5.  Persistent adynamic ileus. 6.  Increased small left pleural effusion and pericardial effusion. 7.  Stable bibasilar pulmonary metastases.   Original Report Authenticated By: Danae Orleans, M.D.     Anti-infectives: Anti-infectives     Start     Dose/Rate Route Frequency Ordered Stop   11/22/11 2000   imipenem-cilastatin (PRIMAXIN) 250 mg in sodium chloride 0.9 % 100 mL IVPB        250 mg 200 mL/hr over 30 Minutes Intravenous Every 6 hours 11/22/11 1423 11/30/11 0816   11/22/11 1430    imipenem-cilastatin (PRIMAXIN) 250 mg in sodium chloride 0.9 % 100 mL IVPB        250 mg 200 mL/hr over 30 Minutes Intravenous NOW 11/22/11 1423 11/22/11 1545   11/17/11 1400   vancomycin (VANCOCIN) IVPB 1000 mg/200 mL premix  Status:  Discontinued        1,000 mg 200 mL/hr over 60 Minutes Intravenous Every 8 hours 11/17/11 0802 11/19/11 0934   11/15/11 1400   imipenem-cilastatin (PRIMAXIN) 500 mg in sodium chloride 0.9 % 100 mL IVPB  Status:  Discontinued        500 mg 200 mL/hr over 30 Minutes Intravenous 3 times per day 11/15/11 0931 11/22/11 1423   11/15/11 0615   vancomycin (VANCOCIN) 750 mg in sodium chloride 0.9 % 150 mL IVPB  Status:  Discontinued        750 mg 150 mL/hr over 60 Minutes Intravenous 3 times per day 11/15/11 0608  11/17/11 0801   11/14/11 1800   imipenem-cilastatin (PRIMAXIN) 250 mg in sodium chloride 0.9 % 100 mL IVPB  Status:  Discontinued        250 mg 200 mL/hr over 30 Minutes Intravenous 4 times per day 11/14/11 1434 11/15/11 0931   11/13/11 1800   vancomycin (VANCOCIN) 750 mg in sodium chloride 0.9 % 150 mL IVPB  Status:  Discontinued        750 mg 150 mL/hr over 60 Minutes Intravenous Every 12 hours 11/13/11 0631 11/15/11 0608   11/13/11 0645   vancomycin (VANCOCIN) 750 mg in sodium chloride 0.9 % 150 mL IVPB        750 mg 150 mL/hr over 60 Minutes Intravenous  Once 11/13/11 0631 11/13/11 0738   11/12/11 1200   imipenem-cilastatin (PRIMAXIN) 500 mg in sodium chloride 0.9 % 100 mL IVPB  Status:  Discontinued        500 mg 200 mL/hr over 30 Minutes Intravenous 3 times per day 11/12/11 1015 11/14/11 1434   11/11/11 2000   micafungin (MYCAMINE) 100 mg in sodium chloride 0.9 % 100 mL IVPB     Comments: stat      100 mg 100 mL/hr over 1 Hours Intravenous Every 24 hours 11/10/11 1939 11/30/11 0816   11/11/11 1800   vancomycin (VANCOCIN) 500 mg in sodium chloride 0.9 % 100 mL IVPB  Status:  Discontinued        500 mg 100 mL/hr over 60 Minutes Intravenous  Every 12 hours 11/11/11 1014 11/13/11 0631   11/10/11 2000   micafungin (MYCAMINE) 100 mg in sodium chloride 0.9 % 100 mL IVPB     Comments: stat      100 mg 100 mL/hr over 1 Hours Intravenous Daily 11/10/11 1939 11/10/11 2215   11/10/11 2000   imipenem-cilastatin (PRIMAXIN) 250 mg in sodium chloride 0.9 % 100 mL IVPB  Status:  Discontinued        250 mg 200 mL/hr over 30 Minutes Intravenous 4 times per day 11/10/11 1951 11/12/11 1014   11/10/11 1630   fluconazole (DIFLUCAN) IVPB 200 mg  Status:  Discontinued        200 mg 100 mL/hr over 60 Minutes Intravenous Every 24 hours 11/10/11 1617 11/10/11 1939   11/10/11 1630   vancomycin (VANCOCIN) 500 mg in sodium chloride 0.9 % 100 mL IVPB  Status:  Discontinued        500 mg 100 mL/hr over 60 Minutes Intravenous 3 times per day 11/10/11 1617 11/11/11 1014   11/10/11 1630   piperacillin-tazobactam (ZOSYN) IVPB 3.375 g  Status:  Discontinued        3.375 g 12.5 mL/hr over 240 Minutes Intravenous 3 times per day 11/10/11 1617 11/10/11 1941   11/10/11 0933   cefOXitin (MEFOXIN) 2 g in dextrose 5 % 50 mL IVPB        2 g 100 mL/hr over 30 Minutes Intravenous 60 min pre-op 11/10/11 0926 11/10/11 1105   11/10/11 0930   cefOXitin (MEFOXIN) 1 g in dextrose 5 % 50 mL IVPB  Status:  Discontinued        1 g 100 mL/hr over 30 Minutes Intravenous 3 times per day 11/10/11 0924 11/10/11 0926   11/07/11 2200   cefTRIAXone (ROCEPHIN) 1 g in dextrose 5 % 50 mL IVPB  Status:  Discontinued        1 g 100 mL/hr over 30 Minutes Intravenous Every 24 hours 11/07/11 2124 11/10/11 1536   11/07/11  1830   cefTRIAXone (ROCEPHIN) 1 g in dextrose 5 % 50 mL IVPB        1 g 100 mL/hr over 30 Minutes Intravenous  Once 11/07/11 1819 11/07/11 1920          Assessment/Plan: Problem List: Patient Active Problem List  Diagnosis  . ADENOCARCINOMA, RECTUM, [id number]  . SBO (small bowel obstruction), closed loop - s/p SB resection 11/10/2011  . Hypertension  .  Tobacco abuse  . 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 Acute respiratory failure following trauma and surgery  . Peritonitis  . Aspiration pneumonia  . Hypotension  . Septic shock(785.52)  . Severe sepsis(995.92)  . Acute renal insufficiency  . Protein calorie malnutrition  . Abscess of male pelvis    RLQ abscess Get IR drain. Resume diet afterward Work toward home afterward.     18 Days Post-Op    LOS: 22 days   [facility]  [phone]  11/29/2011 8:44 AM

## 2011-11-29 NOTE — Progress Notes (Signed)
PT Cancellation Note  Patient Details Name: Jeffery [name] MRN: [id number] DOB: February 18, 1960   Cancelled Treatment:     Pt having procedure today. Will check back another time/day. Thanks.   Rebeca Alert [facility] 11/29/2011, 1:18 PM [phone]

## 2011-11-29 NOTE — Procedures (Signed)
Successful placement of a 10 French drain into the abscess/fluid collection within the right lower abd quadrant Approximately 25 mL of purulent fluid aspirated. Successful placement of a 10 French drain into the abscess/fluid collection within the left upper abd quadrant Approximately 75 mL of purulent fluid aspirated. Samples sent to lab as requested. No immediate post procedural complications.

## 2011-11-29 NOTE — Progress Notes (Signed)
OT Cancellation Note  Patient Details Name: Jeffery [name] MRN: [id number] DOB: 25-Sep-1960   Cancelled Treatment:    Reason Eval/Treat Not Completed: Patient at procedure or test/ unavailable. Pt leaving for radiology. Will check back another time.  Zafir Schauer A, OTR/L [id number] 11/29/2011, 12:45 PM

## 2011-11-29 NOTE — H&P (Signed)
Pt will likely require placement of R LQ and L UQ drains given findings on recent abd CT. May also remove or attempt to reposition R TG drain.

## 2011-11-30 LAB — BASIC METABOLIC PANEL
BUN: 8 mg/dL (ref 6–23)
CO2: 28 mEq/L (ref 19–32)
Calcium: 7.7 mg/dL — ABNORMAL LOW (ref 8.4–10.5)
Chloride: 98 mEq/L (ref 96–112)
Creatinine, Ser: 0.58 mg/dL (ref 0.50–1.35)
Glucose, Bld: 113 mg/dL — ABNORMAL HIGH (ref 70–99)

## 2011-11-30 LAB — CBC
HCT: 26.4 % — ABNORMAL LOW (ref 39.0–52.0)
MCH: 27.4 pg (ref 26.0–34.0)
MCHC: 31.8 g/dL (ref 30.0–36.0)
MCV: 86 fL (ref 78.0–100.0)
Platelets: 648 10*3/uL — ABNORMAL HIGH (ref 150–400)
RDW: 15.6 % — ABNORMAL HIGH (ref 11.5–15.5)

## 2011-11-30 LAB — GLUCOSE, CAPILLARY: Glucose-Capillary: 109 mg/dL — ABNORMAL HIGH (ref 70–99)

## 2011-11-30 MED ORDER — OXYCODONE-ACETAMINOPHEN 5-325 MG PO TABS
1.0000 | ORAL_TABLET | ORAL | Status: DC | PRN
  Administered 2011-11-30 – 2011-12-01 (×2): 2 via ORAL
  Filled 2011-11-30 (×2): qty 2

## 2011-11-30 MED ORDER — POTASSIUM CHLORIDE 10 MEQ/100ML IV SOLN
10.0000 meq | INTRAVENOUS | Status: AC
  Administered 2011-11-30 (×6): 10 meq via INTRAVENOUS
  Filled 2011-11-30 (×6): qty 100

## 2011-11-30 MED ORDER — POTASSIUM CHLORIDE CRYS ER 20 MEQ PO TBCR
40.0000 meq | EXTENDED_RELEASE_TABLET | Freq: Two times a day (BID) | ORAL | Status: DC
  Administered 2011-11-30 – 2011-12-02 (×5): 40 meq via ORAL
  Filled 2011-11-30 (×6): qty 2

## 2011-11-30 MED ORDER — HYDROMORPHONE HCL PF 1 MG/ML IJ SOLN
0.5000 mg | INTRAMUSCULAR | Status: DC | PRN
  Administered 2011-11-30 (×2): 1 mg via INTRAVENOUS
  Filled 2011-11-30 (×2): qty 1

## 2011-11-30 MED ORDER — OXYCODONE HCL 5 MG/5ML PO SOLN
10.0000 mg | Freq: Three times a day (TID) | ORAL | Status: DC
  Administered 2011-11-30 – 2011-12-01 (×4): 10 mg via ORAL
  Filled 2011-11-30 (×4): qty 10

## 2011-11-30 MED ORDER — DIPHENHYDRAMINE HCL 50 MG PO CAPS
50.0000 mg | ORAL_CAPSULE | Freq: Every day | ORAL | Status: DC
  Administered 2011-11-30 – 2011-12-01 (×2): 50 mg via ORAL
  Filled 2011-11-30 (×4): qty 1

## 2011-11-30 NOTE — Progress Notes (Signed)
Patient ID: Jeffery [name], male   DOB: Jul 23, 1960, 51 y.o.   MRN: [id number]  [facility] Surgery Progress Note  Subjective: Got 2 perc drains yesterday in IR.  Remains afebrile.  Pain a little better.    Objective: Vital signs in last 24 hours: Temp:  [97.5 F (36.4 C)-98.9 F (37.2 C)] 98 F (36.7 C) (10/15 0545) Pulse Rate:  [103-113] 103  (10/15 0545) Resp:  [10-19] 18  (10/15 0545) BP: (98-148)/(59-99) 139/86 mmHg (10/15 0545) SpO2:  [97 %-100 %] 99 % (10/15 0545) Weight:  [118 lb 14.4 oz (53.933 kg)] 118 lb 14.4 oz (53.933 kg) (10/15 0719)  Intake/Output from previous day: 10/14 0701 - 10/15 0700 In: 2484 [P.O.:1440; I.V.:544; IV Piggyback:500] Out: 2470 [Urine:1975; Drains:295; Stool:200] Intake/Output this shift: Total I/O In: -  Out: 280 [Urine:250; Drains:30]  Physical Exam: Work of breathing is normal.  Appears in no distress or pain.   Abd soft, sl distended, but less so than yesterday.  Stool, gas in bag.  Pus in perc drains.    Lab Results:  Results for orders placed during the hospital encounter of 11/07/11 (from the past 48 hour(s))  GLUCOSE, CAPILLARY     Status: Abnormal   Collection Time   11/28/11 12:29 PM      Component Value Range Comment   Glucose-Capillary 107 (*) 70 - 99 mg/dL   GLUCOSE, CAPILLARY     Status: Abnormal   Collection Time   11/28/11  4:13 PM      Component Value Range Comment   Glucose-Capillary 165 (*) 70 - 99 mg/dL   GLUCOSE, CAPILLARY     Status: Normal   Collection Time   11/28/11  7:43 PM      Component Value Range Comment   Glucose-Capillary 93  70 - 99 mg/dL   GLUCOSE, CAPILLARY     Status: Abnormal   Collection Time   11/29/11 12:03 AM      Component Value Range Comment   Glucose-Capillary 133 (*) 70 - 99 mg/dL   GLUCOSE, CAPILLARY     Status: Abnormal   Collection Time   11/29/11  4:18 AM      Component Value Range Comment   Glucose-Capillary 107 (*) 70 - 99 mg/dL   COMPREHENSIVE METABOLIC PANEL     Status:  Abnormal   Collection Time   11/29/11  6:25 AM      Component Value Range Comment   Sodium 137  135 - 145 mEq/L    Potassium 2.9 (*) 3.5 - 5.1 mEq/L    Chloride 102  96 - 112 mEq/L    CO2 27  19 - 32 mEq/L    Glucose, Bld 102 (*) 70 - 99 mg/dL    BUN 5 (*) 6 - 23 mg/dL    Creatinine, Ser 0.27  0.50 - 1.35 mg/dL    Calcium 7.5 (*) 8.4 - 10.5 mg/dL    Total Protein 7.0  6.0 - 8.3 g/dL    Albumin 1.7 (*) 3.5 - 5.2 g/dL    AST 13  0 - 37 U/L    ALT 13  0 - 53 U/L    Alkaline Phosphatase 58  39 - 117 U/L    Total Bilirubin 0.3  0.3 - 1.2 mg/dL    GFR calc non Af Amer >90  >90 mL/min    GFR calc Af Amer >90  >90 mL/min   CBC WITH DIFFERENTIAL     Status: Abnormal   Collection Time  11/29/11  6:25 AM      Component Value Range Comment   WBC 8.5  4.0 - 10.5 K/uL    RBC 2.68 (*) 4.22 - 5.81 MIL/uL    Hemoglobin 7.4 (*) 13.0 - 17.0 g/dL    HCT 16.1 (*) 09.6 - 52.0 %    MCV 85.8  78.0 - 100.0 fL    MCH 27.6  26.0 - 34.0 pg    MCHC 32.2  30.0 - 36.0 g/dL    RDW 04.5 (*) 40.9 - 15.5 %    Platelets 416 (*) 150 - 400 K/uL    Neutrophils Relative 74  43 - 77 %    Lymphocytes Relative 11 (*) 12 - 46 %    Monocytes Relative 15 (*) 3 - 12 %    Eosinophils Relative 0  0 - 5 %    Basophils Relative 0  0 - 1 %    Neutro Abs 6.3  1.7 - 7.7 K/uL    Lymphs Abs 0.9  0.7 - 4.0 K/uL    Monocytes Absolute 1.3 (*) 0.1 - 1.0 K/uL    Eosinophils Absolute 0.0  0.0 - 0.7 K/uL    Basophils Absolute 0.0  0.0 - 0.1 K/uL    RBC Morphology POLYCHROMASIA PRESENT     PROTIME-INR     Status: Normal   Collection Time   11/29/11  6:25 AM      Component Value Range Comment   Prothrombin Time 15.0  11.6 - 15.2 seconds    INR 1.20  0.00 - 1.49   GLUCOSE, CAPILLARY     Status: Abnormal   Collection Time   11/29/11  7:32 AM      Component Value Range Comment   Glucose-Capillary 116 (*) 70 - 99 mg/dL   GLUCOSE, CAPILLARY     Status: Abnormal   Collection Time   11/29/11 12:17 PM      Component Value Range  Comment   Glucose-Capillary 100 (*) 70 - 99 mg/dL   CULTURE, ROUTINE-ABSCESS     Status: Normal (Preliminary result)   Collection Time   11/29/11  2:16 PM      Component Value Range Comment   Specimen Description PERITONEAL CAVITY      Special Requests NONE      Gram Stain        Value: ABUNDANT WBC PRESENT,BOTH PMN AND MONONUCLEAR     NO SQUAMOUS EPITHELIAL CELLS SEEN     RARE GRAM NEGATIVE RODS   Culture NO GROWTH      Report Status PENDING     ANAEROBIC CULTURE     Status: Normal (Preliminary result)   Collection Time   11/29/11  2:19 PM      Component Value Range Comment   Specimen Description PERITONEAL CAVITY      Special Requests NONE      Gram Stain        Value: ABUNDANT WBC PRESENT,BOTH PMN AND MONONUCLEAR     NO SQUAMOUS EPITHELIAL CELLS SEEN     RARE GRAM NEGATIVE RODS   Culture PENDING      Report Status PENDING     CULTURE, ROUTINE-ABSCESS     Status: Normal (Preliminary result)   Collection Time   11/29/11  2:19 PM      Component Value Range Comment   Specimen Description PERITONEAL CAVITY      Special Requests NONE      Gram Stain        Value: ABUNDANT  WBC PRESENT,BOTH PMN AND MONONUCLEAR     NO SQUAMOUS EPITHELIAL CELLS SEEN     MODERATE GRAM NEGATIVE RODS   Culture NO GROWTH      Report Status PENDING     ANAEROBIC CULTURE     Status: Normal (Preliminary result)   Collection Time   11/29/11  2:21 PM      Component Value Range Comment   Specimen Description PERITONEAL CAVITY      Special Requests NONE      Gram Stain        Value: ABUNDANT WBC PRESENT,BOTH PMN AND MONONUCLEAR     NO SQUAMOUS EPITHELIAL CELLS SEEN     MODERATE GRAM NEGATIVE RODS   Culture PENDING      Report Status PENDING     GLUCOSE, CAPILLARY     Status: Abnormal   Collection Time   11/29/11  4:31 PM      Component Value Range Comment   Glucose-Capillary 159 (*) 70 - 99 mg/dL    Comment 1 Documented in Chart      Comment 2 Notify RN     GLUCOSE, CAPILLARY     Status:  Abnormal   Collection Time   11/29/11  8:14 PM      Component Value Range Comment   Glucose-Capillary 114 (*) 70 - 99 mg/dL   GLUCOSE, CAPILLARY     Status: Abnormal   Collection Time   11/29/11 11:50 PM      Component Value Range Comment   Glucose-Capillary 116 (*) 70 - 99 mg/dL   GLUCOSE, CAPILLARY     Status: Abnormal   Collection Time   11/30/11  4:56 AM      Component Value Range Comment   Glucose-Capillary 109 (*) 70 - 99 mg/dL   GLUCOSE, CAPILLARY     Status: Abnormal   Collection Time   11/30/11  7:20 AM      Component Value Range Comment   Glucose-Capillary 162 (*) 70 - 99 mg/dL    Comment 1 Notify RN      Comment 2 Documented in Chart     BASIC METABOLIC PANEL     Status: Abnormal   Collection Time   11/30/11  8:45 AM      Component Value Range Comment   Sodium 133 (*) 135 - 145 mEq/L    Potassium 2.8 (*) 3.5 - 5.1 mEq/L    Chloride 98  96 - 112 mEq/L    CO2 28  19 - 32 mEq/L    Glucose, Bld 113 (*) 70 - 99 mg/dL    BUN 8  6 - 23 mg/dL    Creatinine, Ser 2.95  0.50 - 1.35 mg/dL    Calcium 7.7 (*) 8.4 - 10.5 mg/dL    GFR calc non Af Amer >90  >90 mL/min    GFR calc Af Amer >90  >90 mL/min   CBC     Status: Abnormal   Collection Time   11/30/11  8:45 AM      Component Value Range Comment   WBC 11.2 (*) 4.0 - 10.5 K/uL    RBC 3.07 (*) 4.22 - 5.81 MIL/uL    Hemoglobin 8.4 (*) 13.0 - 17.0 g/dL    HCT 62.1 (*) 30.8 - 52.0 %    MCV 86.0  78.0 - 100.0 fL    MCH 27.4  26.0 - 34.0 pg    MCHC 31.8  30.0 - 36.0 g/dL    RDW 65.7 (*) 84.6 -  15.5 %    Platelets 648 (*) 150 - 400 K/uL     Radiology/Results: Ct Guided Abscess Drain  11/29/2011  *RADIOLOGY REPORT*  Indication: Residual fluid collections post multiple prior percutaneous drain placements.  CT GUIDED RIGHT LOWER ABDOMINAL QUADRANT DRAINAGE CATHETER PLACEMENT CT GUIDED LEFT UPPER ABDOMINAL QUADRANT DRAINAGE CATHETER PLACEMENT  Comparison: CT abdomen pelvis - 11/29/2011  Medications: Fentanyl 200 mcg IV;  Versed 3 mg IV  Total Moderate Sedation time: 43 minutes  Contrast: None  Complications: None immediate  Technique / Findings:  Informed written consent was obtained from the patient after a discussion of the risks, benefits and alternatives to treatment. The patient was placed supine on the CT gantry and a pre procedural CT was performed re-demonstrating the known abscess/fluid collections within the both the right lower and left upper abdominal quadrants.  The procedure was planned.   A timeout was performed prior to the initiation of the procedure.  The skin overlying the right lower abdominal quadrant was prepped and draped in the usual sterile fashion.   The overlying soft tissues were anesthetized with 1% lidocaine with epinephrine. Appropriate trajectory was planned with the use of a 22 gauge spinal needle.  An 18 gauge trocar needle was advanced into the abscess/fluid collection and a short Amplatz super stiff wire was coiled within the collection.   Appropriate positioning was confirmed with a limited CT scan.  The tract was serially dilated allowing placement of a 10 [place] all-purpose drainage catheter. Appropriate positioning was confirmed with a limited postprocedural CT scan.  25 ml of purulent fluid was aspirated.  The tube was connected to a drainage bag and sutured in place.  A dressing was placed.  Attention was now paid towards the residual fluid collection in the left upper abdominal quadrant.  The skin overlying the left upper abdomen was prepped and draped in the usual sterile fashion.   The overlying soft tissues were anesthetized with 1% lidocaine with epinephrine.  Appropriate trajectory was planned with the use of a 22 gauge spinal needle.  An 18 gauge trocar needle was advanced into the abscess/fluid collection and a short Amplatz super stiff wire was coiled within the collection.   Appropriate positioning was confirmed with a limited CT scan.  The tract was serially dilated allowing  placement of a 10 [place] all-purpose drainage catheter.  Appropriate positioning was confirmed with a limited postprocedural CT scan.  75 ml of purulent fluid was aspirated.  The tube was connected to a drainage bag and sutured in place.  A dressing was placed.  The patient tolerated the above procedures well without immediate post procedural complication.  Impression:  Successful CT guided placement of a 10 French all purpose drain catheters into the residual abscesses/fluid collections within the right lower and the left upper abdominal quadrants. Samples were sent to the laboratory from both drainage catheters as requested by the ordering clinical team.   Original Report Authenticated By: Waynard Reeds, M.D.    Ct Guided Abscess Drain  11/29/2011  *RADIOLOGY REPORT*  Indication: Residual fluid collections post multiple prior percutaneous drain placements.  CT GUIDED RIGHT LOWER ABDOMINAL QUADRANT DRAINAGE CATHETER PLACEMENT CT GUIDED LEFT UPPER ABDOMINAL QUADRANT DRAINAGE CATHETER PLACEMENT  Comparison: CT abdomen pelvis - 11/29/2011  Medications: Fentanyl 200 mcg IV; Versed 3 mg IV  Total Moderate Sedation time: 43 minutes  Contrast: None  Complications: None immediate  Technique / Findings:  Informed written consent was obtained from the patient after a discussion  of the risks, benefits and alternatives to treatment. The patient was placed supine on the CT gantry and a pre procedural CT was performed re-demonstrating the known abscess/fluid collections within the both the right lower and left upper abdominal quadrants.  The procedure was planned.   A timeout was performed prior to the initiation of the procedure.  The skin overlying the right lower abdominal quadrant was prepped and draped in the usual sterile fashion.   The overlying soft tissues were anesthetized with 1% lidocaine with epinephrine. Appropriate trajectory was planned with the use of a 22 gauge spinal needle.  An 18 gauge trocar needle  was advanced into the abscess/fluid collection and a short Amplatz super stiff wire was coiled within the collection.   Appropriate positioning was confirmed with a limited CT scan.  The tract was serially dilated allowing placement of a 10 [place] all-purpose drainage catheter. Appropriate positioning was confirmed with a limited postprocedural CT scan.  25 ml of purulent fluid was aspirated.  The tube was connected to a drainage bag and sutured in place.  A dressing was placed.  Attention was now paid towards the residual fluid collection in the left upper abdominal quadrant.  The skin overlying the left upper abdomen was prepped and draped in the usual sterile fashion.   The overlying soft tissues were anesthetized with 1% lidocaine with epinephrine.  Appropriate trajectory was planned with the use of a 22 gauge spinal needle.  An 18 gauge trocar needle was advanced into the abscess/fluid collection and a short Amplatz super stiff wire was coiled within the collection.   Appropriate positioning was confirmed with a limited CT scan.  The tract was serially dilated allowing placement of a 10 [place] all-purpose drainage catheter.  Appropriate positioning was confirmed with a limited postprocedural CT scan.  75 ml of purulent fluid was aspirated.  The tube was connected to a drainage bag and sutured in place.  A dressing was placed.  The patient tolerated the above procedures well without immediate post procedural complication.  Impression:  Successful CT guided placement of a 10 French all purpose drain catheters into the residual abscesses/fluid collections within the right lower and the left upper abdominal quadrants. Samples were sent to the laboratory from both drainage catheters as requested by the ordering clinical team.   Original Report Authenticated By: Waynard Reeds, M.D.     Anti-infectives: Anti-infectives     Start     Dose/Rate Route Frequency Ordered Stop   11/22/11 2000   imipenem-cilastatin  (PRIMAXIN) 250 mg in sodium chloride 0.9 % 100 mL IVPB        250 mg 200 mL/hr over 30 Minutes Intravenous Every 6 hours 11/22/11 1423 11/30/11 0816   11/22/11 1430   imipenem-cilastatin (PRIMAXIN) 250 mg in sodium chloride 0.9 % 100 mL IVPB        250 mg 200 mL/hr over 30 Minutes Intravenous NOW 11/22/11 1423 11/22/11 1545   11/17/11 1400   vancomycin (VANCOCIN) IVPB 1000 mg/200 mL premix  Status:  Discontinued        1,000 mg 200 mL/hr over 60 Minutes Intravenous Every 8 hours 11/17/11 0802 11/19/11 0934   11/15/11 1400   imipenem-cilastatin (PRIMAXIN) 500 mg in sodium chloride 0.9 % 100 mL IVPB  Status:  Discontinued        500 mg 200 mL/hr over 30 Minutes Intravenous 3 times per day 11/15/11 0931 11/22/11 1423   11/15/11 0615   vancomycin (VANCOCIN) 750 mg  in sodium chloride 0.9 % 150 mL IVPB  Status:  Discontinued        750 mg 150 mL/hr over 60 Minutes Intravenous 3 times per day 11/15/11 0608 11/17/11 0801   11/14/11 1800   imipenem-cilastatin (PRIMAXIN) 250 mg in sodium chloride 0.9 % 100 mL IVPB  Status:  Discontinued        250 mg 200 mL/hr over 30 Minutes Intravenous 4 times per day 11/14/11 1434 11/15/11 0931   11/13/11 1800   vancomycin (VANCOCIN) 750 mg in sodium chloride 0.9 % 150 mL IVPB  Status:  Discontinued        750 mg 150 mL/hr over 60 Minutes Intravenous Every 12 hours 11/13/11 0631 11/15/11 0608   11/13/11 0645   vancomycin (VANCOCIN) 750 mg in sodium chloride 0.9 % 150 mL IVPB        750 mg 150 mL/hr over 60 Minutes Intravenous  Once 11/13/11 0631 11/13/11 0738   11/12/11 1200   imipenem-cilastatin (PRIMAXIN) 500 mg in sodium chloride 0.9 % 100 mL IVPB  Status:  Discontinued        500 mg 200 mL/hr over 30 Minutes Intravenous 3 times per day 11/12/11 1015 11/14/11 1434   11/11/11 2000   micafungin (MYCAMINE) 100 mg in sodium chloride 0.9 % 100 mL IVPB     Comments: stat      100 mg 100 mL/hr over 1 Hours Intravenous Every 24 hours 11/10/11 1939  11/29/11 2131   11/11/11 1800   vancomycin (VANCOCIN) 500 mg in sodium chloride 0.9 % 100 mL IVPB  Status:  Discontinued        500 mg 100 mL/hr over 60 Minutes Intravenous Every 12 hours 11/11/11 1014 11/13/11 0631   11/10/11 2000   micafungin (MYCAMINE) 100 mg in sodium chloride 0.9 % 100 mL IVPB     Comments: stat      100 mg 100 mL/hr over 1 Hours Intravenous Daily 11/10/11 1939 11/10/11 2215   11/10/11 2000   imipenem-cilastatin (PRIMAXIN) 250 mg in sodium chloride 0.9 % 100 mL IVPB  Status:  Discontinued        250 mg 200 mL/hr over 30 Minutes Intravenous 4 times per day 11/10/11 1951 11/12/11 1014   11/10/11 1630   fluconazole (DIFLUCAN) IVPB 200 mg  Status:  Discontinued        200 mg 100 mL/hr over 60 Minutes Intravenous Every 24 hours 11/10/11 1617 11/10/11 1939   11/10/11 1630   vancomycin (VANCOCIN) 500 mg in sodium chloride 0.9 % 100 mL IVPB  Status:  Discontinued        500 mg 100 mL/hr over 60 Minutes Intravenous 3 times per day 11/10/11 1617 11/11/11 1014   11/10/11 1630   piperacillin-tazobactam (ZOSYN) IVPB 3.375 g  Status:  Discontinued        3.375 g 12.5 mL/hr over 240 Minutes Intravenous 3 times per day 11/10/11 1617 11/10/11 1941   11/10/11 0933   cefOXitin (MEFOXIN) 2 g in dextrose 5 % 50 mL IVPB        2 g 100 mL/hr over 30 Minutes Intravenous 60 min pre-op 11/10/11 0926 11/10/11 1105   11/10/11 0930   cefOXitin (MEFOXIN) 1 g in dextrose 5 % 50 mL IVPB  Status:  Discontinued        1 g 100 mL/hr over 30 Minutes Intravenous 3 times per day 11/10/11 0924 11/10/11 0926   11/07/11 2200   cefTRIAXone (ROCEPHIN) 1 g in dextrose 5 %  50 mL IVPB  Status:  Discontinued        1 g 100 mL/hr over 30 Minutes Intravenous Every 24 hours 11/07/11 2124 11/10/11 1536   11/07/11 1830   cefTRIAXone (ROCEPHIN) 1 g in dextrose 5 % 50 mL IVPB        1 g 100 mL/hr over 30 Minutes Intravenous  Once 11/07/11 1819 11/07/11 1920          Assessment/Plan: Problem  List: Patient Active Problem List  Diagnosis  . ADENOCARCINOMA, RECTUM, [id number]  . SBO (small bowel obstruction), closed loop - s/p SB resection 11/10/2011  . Hypertension  . Tobacco abuse  . 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 Acute respiratory failure following trauma and surgery  . Peritonitis  . Aspiration pneumonia  . Hypotension  . Septic shock(785.52)  . Severe sepsis(995.92)  . Acute renal insufficiency  . Protein calorie malnutrition  . Abscess of male pelvis   Work toward home. Working on PO pain meds.  Will transition to PO antibiotics tomorrow. Benadryl for sleep.       18 Days Post-Op    LOS: 23 days   [facility]  [phone]  11/30/2011 10:21 AM

## 2011-11-30 NOTE — Progress Notes (Signed)
Orders received from MD Byerly to have lab draw BMET and CBC stat, orders entered Means, Yannick Steuber N RN 11-30-2011 8:07am

## 2011-11-30 NOTE — Progress Notes (Signed)
20 Days Post-Op  Subjective: Pt doing a little better since additional abd drains placed yesterday  Objective: Vital signs in last 24 hours: Temp:  [97.5 F (36.4 C)-98.9 F (37.2 C)] 98 F (36.7 C) (10/15 0545) Pulse Rate:  [103-113] 103  (10/15 0545) Resp:  [10-19] 18  (10/15 0545) BP: (98-148)/(59-99) 139/86 mmHg (10/15 0545) SpO2:  [97 %-100 %] 99 % (10/15 0545) Weight:  [118 lb 14.4 oz (53.933 kg)] 118 lb 14.4 oz (53.933 kg) (10/15 0719) Last BM Date: 11/29/11  Intake/Output from previous day: 10/14 0701 - 10/15 0700 In: 2484 [P.O.:1440; I.V.:544; IV Piggyback:500] Out: 2470 [Urine:1975; Drains:295; Stool:200] Intake/Output this shift: Total I/O In: 78 [I.V.:78] Out: 280 [Urine:250; Drains:30]  LUQ drain output minimal today, about 85 cc's 10/14; LLQ rain output 10 cc's today; RLQ drain output 20 cc's today; all drains intact, insertion sites ok, mildly tender; cx's pend on fluid from recent drains  Lab Results:   [facility] 11/30/11 0845 11/29/11 0625  WBC 11.2* 8.5  HGB 8.4* 7.4*  HCT 26.4* 23.0*  PLT 648* 416*   BMET  Basename 11/30/11 0845 11/29/11 0625  NA 133* 137  K 2.8* 2.9*  CL 98 102  CO2 28 27  GLUCOSE 113* 102*  BUN 8 5*  CREATININE 0.58 0.55  CALCIUM 7.7* 7.5*   PT/INR  Basename 11/29/11 0625  LABPROT 15.0  INR 1.20   Results for orders placed during the hospital encounter of 11/07/11  URINE CULTURE     Status: Normal   Collection Time   11/07/11  4:45 PM      Component Value Range Status Comment   Specimen Description URINE, CLEAN CATCH   Final    Special Requests NONE   Final    Culture  Setup Time 11/07/2011 20:12   Final    Colony Count >=100,000 COLONIES/ML   Final    Culture KLEBSIELLA PNEUMONIAE   Final    Report Status 11/09/2011 FINAL   Final    Organism ID, Bacteria KLEBSIELLA PNEUMONIAE   Final   MRSA PCR SCREENING     Status: Normal   Collection Time   11/10/11  3:55 PM      Component Value Range Status Comment   MRSA by  PCR NEGATIVE  NEGATIVE Final   CULTURE, BLOOD (ROUTINE X 2)     Status: Normal   Collection Time   11/16/11  9:30 AM      Component Value Range Status Comment   Specimen Description BLOOD LEFT ARM   Final    Special Requests BOTTLES DRAWN AEROBIC AND ANAEROBIC [facility]   Final    Culture  Setup Time 11/16/2011 19:52   Final    Culture NO GROWTH 5 DAYS   Final    Report Status 11/22/2011 FINAL   Final   CULTURE, BLOOD (ROUTINE X 2)     Status: Normal   Collection Time   11/16/11  9:40 AM      Component Value Range Status Comment   Specimen Description BLOOD LEFT HAND   Final    Special Requests BOTTLES DRAWN AEROBIC AND ANAEROBIC 10CC   Final    Culture  Setup Time 11/16/2011 19:51   Final    Culture NO GROWTH 5 DAYS   Final    Report Status 11/22/2011 FINAL   Final   URINE CULTURE     Status: Normal   Collection Time   11/16/11  9:42 AM      Component Value Range  Status Comment   Specimen Description URINE, CATHETERIZED   Final    Special Requests Normal   Final    Culture  Setup Time 11/17/2011 00:59   Final    Colony Count NO GROWTH   Final    Culture NO GROWTH   Final    Report Status 11/17/2011 FINAL   Final   CULTURE, RESPIRATORY     Status: Normal   Collection Time   11/16/11 12:29 PM      Component Value Range Status Comment   Specimen Description TRACHEAL ASPIRATE   Final    Special Requests Normal   Final    Gram Stain     Final    Value: RARE WBC PRESENT, PREDOMINANTLY PMN     NO SQUAMOUS EPITHELIAL CELLS SEEN     NO ORGANISMS SEEN   Culture NO GROWTH 2 DAYS   Final    Report Status 11/19/2011 FINAL   Final   WOUND CULTURE     Status: Normal   Collection Time   11/17/11  5:40 PM      Component Value Range Status Comment   Specimen Description PERIRECTAL   Final    Special Requests Normal   Final    Gram Stain     Final    Value: RARE WBC PRESENT,BOTH PMN AND MONONUCLEAR     NO SQUAMOUS EPITHELIAL CELLS SEEN     NO ORGANISMS SEEN   Culture FEW STAPHYLOCOCCUS SPECIES  (COAGULASE NEGATIVE)   Final    Report Status 11/20/2011 FINAL   Final   CULTURE, ROUTINE-ABSCESS     Status: Normal   Collection Time   11/21/11  9:52 AM      Component Value Range Status Comment   Specimen Description PERITONEAL CAVITY   Final    Special Requests Normal   Final    Gram Stain     Final    Value: RARE WBC PRESENT, PREDOMINANTLY PMN     NO SQUAMOUS EPITHELIAL CELLS SEEN     NO ORGANISMS SEEN   Culture NO GROWTH 3 DAYS   Final    Report Status 11/24/2011 FINAL   Final   ANAEROBIC CULTURE     Status: Normal   Collection Time   11/21/11  9:52 AM      Component Value Range Status Comment   Specimen Description PERITONEAL CAVITY   Final    Special Requests Normal   Final    Gram Stain     Final    Value: RARE WBC PRESENT, PREDOMINANTLY PMN     NO SQUAMOUS EPITHELIAL CELLS SEEN     NO ORGANISMS SEEN   Culture NO ANAEROBES ISOLATED   Final    Report Status 11/26/2011 FINAL   Final   CULTURE, ROUTINE-ABSCESS     Status: Normal (Preliminary result)   Collection Time   11/29/11  2:16 PM      Component Value Range Status Comment   Specimen Description PERITONEAL CAVITY   Final    Special Requests NONE   Final    Gram Stain     Final    Value: ABUNDANT WBC PRESENT,BOTH PMN AND MONONUCLEAR     NO SQUAMOUS EPITHELIAL CELLS SEEN     RARE GRAM NEGATIVE RODS   Culture NO GROWTH   Final    Report Status PENDING   Incomplete   ANAEROBIC CULTURE     Status: Normal (Preliminary result)   Collection Time   11/29/11  2:19 PM  Component Value Range Status Comment   Specimen Description PERITONEAL CAVITY   Final    Special Requests NONE   Final    Gram Stain     Final    Value: ABUNDANT WBC PRESENT,BOTH PMN AND MONONUCLEAR     NO SQUAMOUS EPITHELIAL CELLS SEEN     RARE GRAM NEGATIVE RODS   Culture     Final    Value: NO ANAEROBES ISOLATED; CULTURE IN PROGRESS FOR 5 DAYS   Report Status PENDING   Incomplete   CULTURE, ROUTINE-ABSCESS     Status: Normal (Preliminary result)     Collection Time   11/29/11  2:19 PM      Component Value Range Status Comment   Specimen Description PERITONEAL CAVITY   Final    Special Requests NONE   Final    Gram Stain     Final    Value: ABUNDANT WBC PRESENT,BOTH PMN AND MONONUCLEAR     NO SQUAMOUS EPITHELIAL CELLS SEEN     MODERATE GRAM NEGATIVE RODS   Culture NO GROWTH   Final    Report Status PENDING   Incomplete   ANAEROBIC CULTURE     Status: Normal (Preliminary result)   Collection Time   11/29/11  2:21 PM      Component Value Range Status Comment   Specimen Description PERITONEAL CAVITY   Final    Special Requests NONE   Final    Gram Stain     Final    Value: ABUNDANT WBC PRESENT,BOTH PMN AND MONONUCLEAR     NO SQUAMOUS EPITHELIAL CELLS SEEN     MODERATE GRAM NEGATIVE RODS   Culture     Final    Value: NO ANAEROBES ISOLATED; CULTURE IN PROGRESS FOR 5 DAYS   Report Status PENDING   Incomplete     ABG No results found for this basename: PHART:2,PCO2:2,PO2:2,HCO3:2 in the last 72 hours  Studies/Results: Ct Guided Abscess Drain  11/29/2011  *RADIOLOGY REPORT*  Indication: Residual fluid collections post multiple prior percutaneous drain placements.  CT GUIDED RIGHT LOWER ABDOMINAL QUADRANT DRAINAGE CATHETER PLACEMENT CT GUIDED LEFT UPPER ABDOMINAL QUADRANT DRAINAGE CATHETER PLACEMENT  Comparison: CT abdomen pelvis - 11/29/2011  Medications: Fentanyl 200 mcg IV; Versed 3 mg IV  Total Moderate Sedation time: 43 minutes  Contrast: None  Complications: None immediate  Technique / Findings:  Informed written consent was obtained from the patient after a discussion of the risks, benefits and alternatives to treatment. The patient was placed supine on the CT gantry and a pre procedural CT was performed re-demonstrating the known abscess/fluid collections within the both the right lower and left upper abdominal quadrants.  The procedure was planned.   A timeout was performed prior to the initiation of the procedure.  The skin  overlying the right lower abdominal quadrant was prepped and draped in the usual sterile fashion.   The overlying soft tissues were anesthetized with 1% lidocaine with epinephrine. Appropriate trajectory was planned with the use of a 22 gauge spinal needle.  An 18 gauge trocar needle was advanced into the abscess/fluid collection and a short Amplatz super stiff wire was coiled within the collection.   Appropriate positioning was confirmed with a limited CT scan.  The tract was serially dilated allowing placement of a 10 [place] all-purpose drainage catheter. Appropriate positioning was confirmed with a limited postprocedural CT scan.  25 ml of purulent fluid was aspirated.  The tube was connected to a drainage bag and sutured in place.  A dressing was placed.  Attention was now paid towards the residual fluid collection in the left upper abdominal quadrant.  The skin overlying the left upper abdomen was prepped and draped in the usual sterile fashion.   The overlying soft tissues were anesthetized with 1% lidocaine with epinephrine.  Appropriate trajectory was planned with the use of a 22 gauge spinal needle.  An 18 gauge trocar needle was advanced into the abscess/fluid collection and a short Amplatz super stiff wire was coiled within the collection.   Appropriate positioning was confirmed with a limited CT scan.  The tract was serially dilated allowing placement of a 10 [place] all-purpose drainage catheter.  Appropriate positioning was confirmed with a limited postprocedural CT scan.  75 ml of purulent fluid was aspirated.  The tube was connected to a drainage bag and sutured in place.  A dressing was placed.  The patient tolerated the above procedures well without immediate post procedural complication.  Impression:  Successful CT guided placement of a 10 [place] all purpose drain catheters into the residual abscesses/fluid collections within the right lower and the left upper abdominal quadrants. Samples were sent  to the laboratory from both drainage catheters as requested by the ordering clinical team.   Original Report Authenticated By: Waynard Reeds, M.D.    Ct Guided Abscess Drain  11/29/2011  *RADIOLOGY REPORT*  Indication: Residual fluid collections post multiple prior percutaneous drain placements.  CT GUIDED RIGHT LOWER ABDOMINAL QUADRANT DRAINAGE CATHETER PLACEMENT CT GUIDED LEFT UPPER ABDOMINAL QUADRANT DRAINAGE CATHETER PLACEMENT  Comparison: CT abdomen pelvis - 11/29/2011  Medications: Fentanyl 200 mcg IV; Versed 3 mg IV  Total Moderate Sedation time: 43 minutes  Contrast: None  Complications: None immediate  Technique / Findings:  Informed written consent was obtained from the patient after a discussion of the risks, benefits and alternatives to treatment. The patient was placed supine on the CT gantry and a pre procedural CT was performed re-demonstrating the known abscess/fluid collections within the both the right lower and left upper abdominal quadrants.  The procedure was planned.   A timeout was performed prior to the initiation of the procedure.  The skin overlying the right lower abdominal quadrant was prepped and draped in the usual sterile fashion.   The overlying soft tissues were anesthetized with 1% lidocaine with epinephrine. Appropriate trajectory was planned with the use of a 22 gauge spinal needle.  An 18 gauge trocar needle was advanced into the abscess/fluid collection and a short Amplatz super stiff wire was coiled within the collection.   Appropriate positioning was confirmed with a limited CT scan.  The tract was serially dilated allowing placement of a 10 [place] all-purpose drainage catheter. Appropriate positioning was confirmed with a limited postprocedural CT scan.  25 ml of purulent fluid was aspirated.  The tube was connected to a drainage bag and sutured in place.  A dressing was placed.  Attention was now paid towards the residual fluid collection in the left upper abdominal  quadrant.  The skin overlying the left upper abdomen was prepped and draped in the usual sterile fashion.   The overlying soft tissues were anesthetized with 1% lidocaine with epinephrine.  Appropriate trajectory was planned with the use of a 22 gauge spinal needle.  An 18 gauge trocar needle was advanced into the abscess/fluid collection and a short Amplatz super stiff wire was coiled within the collection.   Appropriate positioning was confirmed with a limited CT scan.  The tract was  serially dilated allowing placement of a 10 [place] all-purpose drainage catheter.  Appropriate positioning was confirmed with a limited postprocedural CT scan.  75 ml of purulent fluid was aspirated.  The tube was connected to a drainage bag and sutured in place.  A dressing was placed.  The patient tolerated the above procedures well without immediate post procedural complication.  Impression:  Successful CT guided placement of a 10 French all purpose drain catheters into the residual abscesses/fluid collections within the right lower and the left upper abdominal quadrants. Samples were sent to the laboratory from both drainage catheters as requested by the ordering clinical team.   Original Report Authenticated By: Waynard Reeds, M.D.     Anti-infectives: Anti-infectives     Start     Dose/Rate Route Frequency Ordered Stop   11/22/11 2000   imipenem-cilastatin (PRIMAXIN) 250 mg in sodium chloride 0.9 % 100 mL IVPB        250 mg 200 mL/hr over 30 Minutes Intravenous Every 6 hours 11/22/11 1423 11/30/11 0816   11/22/11 1430   imipenem-cilastatin (PRIMAXIN) 250 mg in sodium chloride 0.9 % 100 mL IVPB        250 mg 200 mL/hr over 30 Minutes Intravenous NOW 11/22/11 1423 11/22/11 1545   11/17/11 1400   vancomycin (VANCOCIN) IVPB 1000 mg/200 mL premix  Status:  Discontinued        1,000 mg 200 mL/hr over 60 Minutes Intravenous Every 8 hours 11/17/11 0802 11/19/11 0934   11/15/11 1400   imipenem-cilastatin (PRIMAXIN)  500 mg in sodium chloride 0.9 % 100 mL IVPB  Status:  Discontinued        500 mg 200 mL/hr over 30 Minutes Intravenous 3 times per day 11/15/11 0931 11/22/11 1423   11/15/11 0615   vancomycin (VANCOCIN) 750 mg in sodium chloride 0.9 % 150 mL IVPB  Status:  Discontinued        750 mg 150 mL/hr over 60 Minutes Intravenous 3 times per day 11/15/11 0608 11/17/11 0801   11/14/11 1800   imipenem-cilastatin (PRIMAXIN) 250 mg in sodium chloride 0.9 % 100 mL IVPB  Status:  Discontinued        250 mg 200 mL/hr over 30 Minutes Intravenous 4 times per day 11/14/11 1434 11/15/11 0931   11/13/11 1800   vancomycin (VANCOCIN) 750 mg in sodium chloride 0.9 % 150 mL IVPB  Status:  Discontinued        750 mg 150 mL/hr over 60 Minutes Intravenous Every 12 hours 11/13/11 0631 11/15/11 0608   11/13/11 0645   vancomycin (VANCOCIN) 750 mg in sodium chloride 0.9 % 150 mL IVPB        750 mg 150 mL/hr over 60 Minutes Intravenous  Once 11/13/11 0631 11/13/11 0738   11/12/11 1200   imipenem-cilastatin (PRIMAXIN) 500 mg in sodium chloride 0.9 % 100 mL IVPB  Status:  Discontinued        500 mg 200 mL/hr over 30 Minutes Intravenous 3 times per day 11/12/11 1015 11/14/11 1434   11/11/11 2000   micafungin (MYCAMINE) 100 mg in sodium chloride 0.9 % 100 mL IVPB     Comments: stat      100 mg 100 mL/hr over 1 Hours Intravenous Every 24 hours 11/10/11 1939 11/29/11 2131   11/11/11 1800   vancomycin (VANCOCIN) 500 mg in sodium chloride 0.9 % 100 mL IVPB  Status:  Discontinued        500 mg 100 mL/hr over 60 Minutes  Intravenous Every 12 hours 11/11/11 1014 11/13/11 0631   11/10/11 2000   micafungin (MYCAMINE) 100 mg in sodium chloride 0.9 % 100 mL IVPB     Comments: stat      100 mg 100 mL/hr over 1 Hours Intravenous Daily 11/10/11 1939 11/10/11 2215   11/10/11 2000   imipenem-cilastatin (PRIMAXIN) 250 mg in sodium chloride 0.9 % 100 mL IVPB  Status:  Discontinued        250 mg 200 mL/hr over 30 Minutes Intravenous  4 times per day 11/10/11 1951 11/12/11 1014   11/10/11 1630   fluconazole (DIFLUCAN) IVPB 200 mg  Status:  Discontinued        200 mg 100 mL/hr over 60 Minutes Intravenous Every 24 hours 11/10/11 1617 11/10/11 1939   11/10/11 1630   vancomycin (VANCOCIN) 500 mg in sodium chloride 0.9 % 100 mL IVPB  Status:  Discontinued        500 mg 100 mL/hr over 60 Minutes Intravenous 3 times per day 11/10/11 1617 11/11/11 1014   11/10/11 1630   piperacillin-tazobactam (ZOSYN) IVPB 3.375 g  Status:  Discontinued        3.375 g 12.5 mL/hr over 240 Minutes Intravenous 3 times per day 11/10/11 1617 11/10/11 1941   11/10/11 0933   cefOXitin (MEFOXIN) 2 g in dextrose 5 % 50 mL IVPB        2 g 100 mL/hr over 30 Minutes Intravenous 60 min pre-op 11/10/11 0926 11/10/11 1105   11/10/11 0930   cefOXitin (MEFOXIN) 1 g in dextrose 5 % 50 mL IVPB  Status:  Discontinued        1 g 100 mL/hr over 30 Minutes Intravenous 3 times per day 11/10/11 0924 11/10/11 0926   11/07/11 2200   cefTRIAXone (ROCEPHIN) 1 g in dextrose 5 % 50 mL IVPB  Status:  Discontinued        1 g 100 mL/hr over 30 Minutes Intravenous Every 24 hours 11/07/11 2124 11/10/11 1536   11/07/11 1830   cefTRIAXone (ROCEPHIN) 1 g in dextrose 5 % 50 mL IVPB        1 g 100 mL/hr over 30 Minutes Intravenous  Once 11/07/11 1819 11/07/11 1920          Assessment/Plan: s/p abd /pelvic abscess drainages 10/6 and 10/14; check final cx's; cont drain flushes; replace K; check f/u CT next week   LOS: 23 days    Nichoel Digiulio,D [facility] 11/30/2011

## 2011-12-01 LAB — GLUCOSE, CAPILLARY
Glucose-Capillary: 101 mg/dL — ABNORMAL HIGH (ref 70–99)
Glucose-Capillary: 123 mg/dL — ABNORMAL HIGH (ref 70–99)
Glucose-Capillary: 91 mg/dL (ref 70–99)
Glucose-Capillary: 97 mg/dL (ref 70–99)

## 2011-12-01 LAB — CBC
HCT: 25.6 % — ABNORMAL LOW (ref 39.0–52.0)
MCV: 86.8 fL (ref 78.0–100.0)
RBC: 2.95 MIL/uL — ABNORMAL LOW (ref 4.22–5.81)
RDW: 15.5 % (ref 11.5–15.5)
WBC: 11.5 10*3/uL — ABNORMAL HIGH (ref 4.0–10.5)

## 2011-12-01 LAB — BASIC METABOLIC PANEL
BUN: 9 mg/dL (ref 6–23)
CO2: 29 mEq/L (ref 19–32)
Chloride: 104 mEq/L (ref 96–112)
Creatinine, Ser: 0.6 mg/dL (ref 0.50–1.35)
GFR calc Af Amer: >90 mL/min (ref >90)
Potassium: 3.8 mEq/L (ref 3.5–5.1)

## 2011-12-01 LAB — PREALBUMIN: Prealbumin: 5 mg/dL — ABNORMAL LOW (ref 17.0–34.0)

## 2011-12-01 MED ORDER — OXYCODONE HCL 5 MG PO TABS
5.0000 mg | ORAL_TABLET | ORAL | Status: DC | PRN
  Administered 2011-12-01 (×3): 10 mg via ORAL
  Administered 2011-12-01: 15 mg via ORAL
  Administered 2011-12-01 – 2011-12-02 (×3): 10 mg via ORAL
  Filled 2011-12-01 (×3): qty 2
  Filled 2011-12-01: qty 3
  Filled 2011-12-01 (×3): qty 2

## 2011-12-01 MED ORDER — AMOXICILLIN-POT CLAVULANATE 875-125 MG PO TABS
1.0000 | ORAL_TABLET | Freq: Two times a day (BID) | ORAL | Status: DC
  Administered 2011-12-01 – 2011-12-02 (×3): 1 via ORAL
  Filled 2011-12-01 (×4): qty 1

## 2011-12-01 MED ORDER — OXYCODONE HCL 5 MG PO TABS: 5.0000 mg | ORAL_TABLET | ORAL | Status: DC | PRN

## 2011-12-01 NOTE — Progress Notes (Addendum)
21 Days Post-Op  Subjective: Pt without new c/o  Objective: Vital signs in last 24 hours: Temp:  [98 F (36.7 C)-98.9 F (37.2 C)] 98.9 F (37.2 C) (10/16 1400) Pulse Rate:  [102-123] 123  (10/16 1400) Resp:  [18-20] 20  (10/16 1400) BP: (131-141)/(85-104) 140/104 mmHg (10/16 1400) SpO2:  [98 %-100 %] 100 % (10/16 1400) Last BM Date: 11/30/11  Intake/Output from previous day: 10/15 0701 - 10/16 0700 In: 2632.3 [P.O.:1680; I.V.:482.3; IV Piggyback:400] Out: 3592.5 [Urine:3100; Drains:492.5] Intake/Output this shift: Total I/O In: 500 [P.O.:480; Other:20] Out: 340 [Urine:300; Drains:40]  LUQ/LLQ/ RLQ drains intact, outputs about 20 cc's today; drains flushed with 5cc's sterile NS without difficulty; 10/14 fluid cx's pend  Lab Results:   [facility] 12/01/11 0510 11/30/11 0845  WBC 11.5* 11.2*  HGB 8.1* 8.4*  HCT 25.6* 26.4*  PLT 557* 648*   BMET  Basename 12/01/11 0510 11/30/11 0845  NA 139 133*  K 3.8 2.8*  CL 104 98  CO2 29 28  GLUCOSE 136* 113*  BUN 9 8  CREATININE 0.60 0.58  CALCIUM 8.2* 7.7*   PT/INR  Basename 11/29/11 0625  LABPROT 15.0  INR 1.20   Results for orders placed during the hospital encounter of 11/07/11  URINE CULTURE     Status: Normal   Collection Time   11/07/11  4:45 PM      Component Value Range Status Comment   Specimen Description URINE, CLEAN CATCH   Final    Special Requests NONE   Final    Culture  Setup Time 11/07/2011 20:12   Final    Colony Count >=100,000 COLONIES/ML   Final    Culture KLEBSIELLA PNEUMONIAE   Final    Report Status 11/09/2011 FINAL   Final    Organism ID, Bacteria KLEBSIELLA PNEUMONIAE   Final   MRSA PCR SCREENING     Status: Normal   Collection Time   11/10/11  3:55 PM      Component Value Range Status Comment   MRSA by PCR NEGATIVE  NEGATIVE Final   CULTURE, BLOOD (ROUTINE X 2)     Status: Normal   Collection Time   11/16/11  9:30 AM      Component Value Range Status Comment   Specimen Description  BLOOD LEFT ARM   Final    Special Requests BOTTLES DRAWN AEROBIC AND ANAEROBIC [facility]   Final    Culture  Setup Time 11/16/2011 19:52   Final    Culture NO GROWTH 5 DAYS   Final    Report Status 11/22/2011 FINAL   Final   CULTURE, BLOOD (ROUTINE X 2)     Status: Normal   Collection Time   11/16/11  9:40 AM      Component Value Range Status Comment   Specimen Description BLOOD LEFT HAND   Final    Special Requests BOTTLES DRAWN AEROBIC AND ANAEROBIC 10CC   Final    Culture  Setup Time 11/16/2011 19:51   Final    Culture NO GROWTH 5 DAYS   Final    Report Status 11/22/2011 FINAL   Final   URINE CULTURE     Status: Normal   Collection Time   11/16/11  9:42 AM      Component Value Range Status Comment   Specimen Description URINE, CATHETERIZED   Final    Special Requests Normal   Final    Culture  Setup Time 11/17/2011 00:59   Final    Colony Count NO  GROWTH   Final    Culture NO GROWTH   Final    Report Status 11/17/2011 FINAL   Final   CULTURE, RESPIRATORY     Status: Normal   Collection Time   11/16/11 12:29 PM      Component Value Range Status Comment   Specimen Description TRACHEAL ASPIRATE   Final    Special Requests Normal   Final    Gram Stain     Final    Value: RARE WBC PRESENT, PREDOMINANTLY PMN     NO SQUAMOUS EPITHELIAL CELLS SEEN     NO ORGANISMS SEEN   Culture NO GROWTH 2 DAYS   Final    Report Status 11/19/2011 FINAL   Final   WOUND CULTURE     Status: Normal   Collection Time   11/17/11  5:40 PM      Component Value Range Status Comment   Specimen Description PERIRECTAL   Final    Special Requests Normal   Final    Gram Stain     Final    Value: RARE WBC PRESENT,BOTH PMN AND MONONUCLEAR     NO SQUAMOUS EPITHELIAL CELLS SEEN     NO ORGANISMS SEEN   Culture FEW STAPHYLOCOCCUS SPECIES (COAGULASE NEGATIVE)   Final    Report Status 11/20/2011 FINAL   Final   CULTURE, ROUTINE-ABSCESS     Status: Normal   Collection Time   11/21/11  9:52 AM      Component Value Range  Status Comment   Specimen Description PERITONEAL CAVITY   Final    Special Requests Normal   Final    Gram Stain     Final    Value: RARE WBC PRESENT, PREDOMINANTLY PMN     NO SQUAMOUS EPITHELIAL CELLS SEEN     NO ORGANISMS SEEN   Culture NO GROWTH 3 DAYS   Final    Report Status 11/24/2011 FINAL   Final   ANAEROBIC CULTURE     Status: Normal   Collection Time   11/21/11  9:52 AM      Component Value Range Status Comment   Specimen Description PERITONEAL CAVITY   Final    Special Requests Normal   Final    Gram Stain     Final    Value: RARE WBC PRESENT, PREDOMINANTLY PMN     NO SQUAMOUS EPITHELIAL CELLS SEEN     NO ORGANISMS SEEN   Culture NO ANAEROBES ISOLATED   Final    Report Status 11/26/2011 FINAL   Final   CULTURE, ROUTINE-ABSCESS     Status: Normal (Preliminary result)   Collection Time   11/29/11  2:16 PM      Component Value Range Status Comment   Specimen Description PERITONEAL CAVITY   Final    Special Requests NONE   Final    Gram Stain     Final    Value: ABUNDANT WBC PRESENT,BOTH PMN AND MONONUCLEAR     NO SQUAMOUS EPITHELIAL CELLS SEEN     RARE GRAM NEGATIVE RODS   Culture Culture reincubated for better growth   Final    Report Status PENDING   Incomplete   ANAEROBIC CULTURE     Status: Normal (Preliminary result)   Collection Time   11/29/11  2:19 PM      Component Value Range Status Comment   Specimen Description PERITONEAL CAVITY   Final    Special Requests NONE   Final    Gram Stain  Final    Value: ABUNDANT WBC PRESENT,BOTH PMN AND MONONUCLEAR     NO SQUAMOUS EPITHELIAL CELLS SEEN     RARE GRAM NEGATIVE RODS   Culture     Final    Value: NO ANAEROBES ISOLATED; CULTURE IN PROGRESS FOR 5 DAYS   Report Status PENDING   Incomplete   CULTURE, ROUTINE-ABSCESS     Status: Normal (Preliminary result)   Collection Time   11/29/11  2:19 PM      Component Value Range Status Comment   Specimen Description PERITONEAL CAVITY   Final    Special Requests NONE    Final    Gram Stain     Final    Value: ABUNDANT WBC PRESENT,BOTH PMN AND MONONUCLEAR     NO SQUAMOUS EPITHELIAL CELLS SEEN     MODERATE GRAM NEGATIVE RODS   Culture NO GROWTH 1 DAY   Final    Report Status PENDING   Incomplete   ANAEROBIC CULTURE     Status: Normal (Preliminary result)   Collection Time   11/29/11  2:21 PM      Component Value Range Status Comment   Specimen Description PERITONEAL CAVITY   Final    Special Requests NONE   Final    Gram Stain     Final    Value: ABUNDANT WBC PRESENT,BOTH PMN AND MONONUCLEAR     NO SQUAMOUS EPITHELIAL CELLS SEEN     MODERATE GRAM NEGATIVE RODS   Culture     Final    Value: NO ANAEROBES ISOLATED; CULTURE IN PROGRESS FOR 5 DAYS   Report Status PENDING   Incomplete     ABG No results found for this basename: PHART:2,PCO2:2,PO2:2,HCO3:2 in the last 72 hours  Studies/Results: No results found.  Anti-infectives: Anti-infectives     Start     Dose/Rate Route Frequency Ordered Stop   12/01/11 1000  amoxicillin-clavulanate (AUGMENTIN) 875-125 MG per tablet 1 tablet       1 tablet Oral Every 12 hours 12/01/11 0640     11/22/11 2000   imipenem-cilastatin (PRIMAXIN) 250 mg in sodium chloride 0.9 % 100 mL IVPB        250 mg 200 mL/hr over 30 Minutes Intravenous Every 6 hours 11/22/11 1423 11/30/11 0816   11/22/11 1430   imipenem-cilastatin (PRIMAXIN) 250 mg in sodium chloride 0.9 % 100 mL IVPB        250 mg 200 mL/hr over 30 Minutes Intravenous NOW 11/22/11 1423 11/22/11 1545   11/17/11 1400   vancomycin (VANCOCIN) IVPB 1000 mg/200 mL premix  Status:  Discontinued        1,000 mg 200 mL/hr over 60 Minutes Intravenous Every 8 hours 11/17/11 0802 11/19/11 0934   11/15/11 1400   imipenem-cilastatin (PRIMAXIN) 500 mg in sodium chloride 0.9 % 100 mL IVPB  Status:  Discontinued        500 mg 200 mL/hr over 30 Minutes Intravenous 3 times per day 11/15/11 0931 11/22/11 1423   11/15/11 0615   vancomycin (VANCOCIN) 750 mg in sodium  chloride 0.9 % 150 mL IVPB  Status:  Discontinued        750 mg 150 mL/hr over 60 Minutes Intravenous 3 times per day 11/15/11 0608 11/17/11 0801   11/14/11 1800   imipenem-cilastatin (PRIMAXIN) 250 mg in sodium chloride 0.9 % 100 mL IVPB  Status:  Discontinued        250 mg 200 mL/hr over 30 Minutes Intravenous 4 times per day 11/14/11 1434 11/15/11  0981   11/13/11 1800   vancomycin (VANCOCIN) 750 mg in sodium chloride 0.9 % 150 mL IVPB  Status:  Discontinued        750 mg 150 mL/hr over 60 Minutes Intravenous Every 12 hours 11/13/11 0631 11/15/11 0608   11/13/11 0645   vancomycin (VANCOCIN) 750 mg in sodium chloride 0.9 % 150 mL IVPB        750 mg 150 mL/hr over 60 Minutes Intravenous  Once 11/13/11 0631 11/13/11 0738   11/12/11 1200   imipenem-cilastatin (PRIMAXIN) 500 mg in sodium chloride 0.9 % 100 mL IVPB  Status:  Discontinued        500 mg 200 mL/hr over 30 Minutes Intravenous 3 times per day 11/12/11 1015 11/14/11 1434   11/11/11 2000   micafungin (MYCAMINE) 100 mg in sodium chloride 0.9 % 100 mL IVPB     Comments: stat      100 mg 100 mL/hr over 1 Hours Intravenous Every 24 hours 11/10/11 1939 11/29/11 2131   11/11/11 1800   vancomycin (VANCOCIN) 500 mg in sodium chloride 0.9 % 100 mL IVPB  Status:  Discontinued        500 mg 100 mL/hr over 60 Minutes Intravenous Every 12 hours 11/11/11 1014 11/13/11 0631   11/10/11 2000   micafungin (MYCAMINE) 100 mg in sodium chloride 0.9 % 100 mL IVPB     Comments: stat      100 mg 100 mL/hr over 1 Hours Intravenous Daily 11/10/11 1939 11/10/11 2215   11/10/11 2000   imipenem-cilastatin (PRIMAXIN) 250 mg in sodium chloride 0.9 % 100 mL IVPB  Status:  Discontinued        250 mg 200 mL/hr over 30 Minutes Intravenous 4 times per day 11/10/11 1951 11/12/11 1014   11/10/11 1630   fluconazole (DIFLUCAN) IVPB 200 mg  Status:  Discontinued        200 mg 100 mL/hr over 60 Minutes Intravenous Every 24 hours 11/10/11 1617 11/10/11 1939    11/10/11 1630   vancomycin (VANCOCIN) 500 mg in sodium chloride 0.9 % 100 mL IVPB  Status:  Discontinued        500 mg 100 mL/hr over 60 Minutes Intravenous 3 times per day 11/10/11 1617 11/11/11 1014   11/10/11 1630   piperacillin-tazobactam (ZOSYN) IVPB 3.375 g  Status:  Discontinued        3.375 g 12.5 mL/hr over 240 Minutes Intravenous 3 times per day 11/10/11 1617 11/10/11 1941   11/10/11 0933   cefOXitin (MEFOXIN) 2 g in dextrose 5 % 50 mL IVPB        2 g 100 mL/hr over 30 Minutes Intravenous 60 min pre-op 11/10/11 0926 11/10/11 1105   11/10/11 0930   cefOXitin (MEFOXIN) 1 g in dextrose 5 % 50 mL IVPB  Status:  Discontinued        1 g 100 mL/hr over 30 Minutes Intravenous 3 times per day 11/10/11 0924 11/10/11 0926   11/07/11 2200   cefTRIAXone (ROCEPHIN) 1 g in dextrose 5 % 50 mL IVPB  Status:  Discontinued        1 g 100 mL/hr over 30 Minutes Intravenous Every 24 hours 11/07/11 2124 11/10/11 1536   11/07/11 1830   cefTRIAXone (ROCEPHIN) 1 g in dextrose 5 % 50 mL IVPB        1 g 100 mL/hr over 30 Minutes Intravenous  Once 11/07/11 1819 11/07/11 1920          Assessment/Plan: s/p  drainage of multiple abd/pelvic fluid coll/abscesses 10/6, 10/14;check final cx's;  plans as per CCS; would recheck CT next week as OP; cont drain flushing regimen of 5 cc's sterile NS 1-2 times/day, record output and change dressings every 1-2 days.   LOS: 24 days    ALLRED,D [facility] 12/01/2011

## 2011-12-01 NOTE — Progress Notes (Signed)
Occupational Therapy Treatment Patient Details Name: Jeffery [name] MRN: [id number] DOB: Aug 01, 1960 Today's Date: 12/01/2011 Time: 4098-1191 OT Time Calculation (min): 14 min  OT Assessment / Plan / Recommendation Comments on Treatment Session Pt has met all goals as stated on inital eval. Pt still with some impulsivity and minor balance impairments but feel he will be able to successfully complete ADLs at home.    Follow Up Recommendations  No OT follow up    Barriers to Discharge       Equipment Recommendations  None recommended by OT    Recommendations for Other Services    Frequency     Plan All goals met and education completed, patient discharged from OT services    Precautions / Restrictions Precautions Precaution Comments: vac, pic, port, drains. Restrictions Weight Bearing Restrictions: No   Pertinent Vitals/Pain Minor pain noted in abdomen. Pt repositioned self for comfort.    ADL  Lower Body Dressing: Performed;Independent Where Assessed - Lower Body Dressing: Unsupported sit to stand Toilet Transfer: Performed;Independent Toilet Transfer Method: Sit to Barista: Regular height toilet Toileting - Clothing Manipulation and Hygiene: Simulated;Independent Where Assessed - Toileting Clothing Manipulation and Hygiene: Sit to stand from 3-in-1 or toilet Tub/Shower Transfer: Simulated;Supervision/safety Tub/Shower Transfer Method: Science writer: Other (comment) (Pt required min cues to step slowly and hold onto wall.) Transfers/Ambulation Related to ADLs: Pt ambulated around the room steadily without RW. Pt stated he would be spongebathing due to drains and vac once at home. Pt also stated he has been bathing, dressing and emptying bag independently.    OT Diagnosis:    OT Problem List:   OT Treatment Interventions:     OT Goals ADL Goals ADL Goal: Grooming - Progress: Met ADL Goal: Toilet Transfer - Progress:  Met ADL Goal: Toileting - Hygiene - Progress: Met ADL Goal: Tub/Shower Transfer - Progress: Met (Pt now mod I/independent with these tasks.)  Visit Information  Last OT Received On: 12/01/11 Assistance Needed: +1    Subjective Data  Subjective: I can do everything myself.   Prior Functioning       Cognition  Overall Cognitive Status: Appears within functional limits for tasks assessed/performed Area of Impairment: Safety/judgement Arousal/Alertness: Awake/alert Orientation Level: Appears intact for tasks assessed Behavior During Session: [facility] for tasks performed Safety/Judgement: Impulsive    Mobility  Shoulder Instructions Bed Mobility Supine to Sit: 7: Independent Transfers Sit to Stand: 6: Modified independent (Device/Increase time) Stand to Sit: 6: Modified independent (Device/Increase time)       Exercises      Balance High Level Balance High Level Balance Activites: Side stepping;Direction changes;Turns;Sudden stops;Head turns High Level Balance Comments: Pt was able to completed high level balance activities with supervision and no LOB. Pt also able to pick items up off the floor without difficulty.    End of Session OT - End of Session Activity Tolerance: Patient tolerated treatment well Patient left: in chair;with call bell/phone within reach  GO     Lorae Roig A OTR/L [phone] 12/01/2011, 10:11 AM

## 2011-12-01 NOTE — Progress Notes (Signed)
Wound vac changed. Measured 14cm long and 2cm at the widest part.

## 2011-12-01 NOTE — Progress Notes (Signed)
Physical Therapy Discharge Patient Details Name: Jeffery [name] MRN: [id number] DOB: 11/22/1960 Today's Date: 12/01/2011 Time: 4098-1191 PT Time Calculation (min): 5 min  Patient discharged from PT services secondary to goals met and no further PT needs identified.  Please see latest therapy progress note for current level of functioning and progress toward goals.    Progress and discharge plan discussed with patient and/or caregiver: Patient/Caregiver agrees with plan.  Pt resting in bed and stated he had long walk with family earlier today. Pt reports he does not need further therapy, that he is doing fine with mobility and is ready to DC home tomorrow. He would like a straight cane for use at home.   PT signing off.  GP     Ralene Bathe Kistler 12/01/2011, 1:56 PM   [phone]

## 2011-12-01 NOTE — Progress Notes (Signed)
Patient ID: Jeffery [name], male   DOB: 09-Jun-1960, 51 y.o.   MRN: [id number]  [facility] Progress Note  Subjective: Afebrile.  Drains with GNR on gram stain.  Eating, used less pain medication yesterday.   Objective: Vital signs in last 24 hours: Temp:  [98 F (36.7 C)-99.2 F (37.3 C)] 98.3 F (36.8 C) (10/16 0600) Pulse Rate:  [102-111] 102  (10/16 0600) Resp:  [18-20] 18  (10/16 0600) BP: (132-141)/(85-89) 141/86 mmHg (10/16 0600) SpO2:  [98 %-99 %] 98 % (10/16 0600) Weight:  [118 lb 14.4 oz (53.933 kg)] 118 lb 14.4 oz (53.933 kg) (10/15 0719)  Intake/Output from previous day: 10/15 0701 - 10/16 0700 In: 2632.3 [P.O.:1680; I.V.:482.3; IV Piggyback:400] Out: 3592.5 [Urine:3100; Drains:492.5] Intake/Output this shift: Total I/O In: 1486.3 [P.O.:1200; I.V.:246.3; Other:40] Out: 2737.5 [Urine:2350; Drains:387.5]  Physical Exam: Work of breathing is normal.  Appears in no distress or pain.   Abd soft, continues to be less distended.  Stool, gas in bag.  Pus in perc drains.  wound vac in place  Lab Results:  Results for orders placed during the hospital encounter of 11/07/11 (from the past 48 hour(s))  GLUCOSE, CAPILLARY     Status: Abnormal   Collection Time   11/29/11  7:32 AM      Component Value Range Comment   Glucose-Capillary 116 (*) 70 - 99 mg/dL   GLUCOSE, CAPILLARY     Status: Abnormal   Collection Time   11/29/11 12:17 PM      Component Value Range Comment   Glucose-Capillary 100 (*) 70 - 99 mg/dL   CULTURE, ROUTINE-ABSCESS     Status: Normal (Preliminary result)   Collection Time   11/29/11  2:16 PM      Component Value Range Comment   Specimen Description PERITONEAL CAVITY      Special Requests NONE      Gram Stain        Value: ABUNDANT WBC PRESENT,BOTH PMN AND MONONUCLEAR     NO SQUAMOUS EPITHELIAL CELLS SEEN     RARE GRAM NEGATIVE RODS   Culture NO GROWTH      Report Status PENDING     ANAEROBIC CULTURE     Status: Normal (Preliminary  result)   Collection Time   11/29/11  2:19 PM      Component Value Range Comment   Specimen Description PERITONEAL CAVITY      Special Requests NONE      Gram Stain        Value: ABUNDANT WBC PRESENT,BOTH PMN AND MONONUCLEAR     NO SQUAMOUS EPITHELIAL CELLS SEEN     RARE GRAM NEGATIVE RODS   Culture        Value: NO ANAEROBES ISOLATED; CULTURE IN PROGRESS FOR 5 DAYS   Report Status PENDING     CULTURE, ROUTINE-ABSCESS     Status: Normal (Preliminary result)   Collection Time   11/29/11  2:19 PM      Component Value Range Comment   Specimen Description PERITONEAL CAVITY      Special Requests NONE      Gram Stain        Value: ABUNDANT WBC PRESENT,BOTH PMN AND MONONUCLEAR     NO SQUAMOUS EPITHELIAL CELLS SEEN     MODERATE GRAM NEGATIVE RODS   Culture NO GROWTH      Report Status PENDING     ANAEROBIC CULTURE     Status: Normal (Preliminary result)   Collection Time  11/29/11  2:21 PM      Component Value Range Comment   Specimen Description PERITONEAL CAVITY      Special Requests NONE      Gram Stain        Value: ABUNDANT WBC PRESENT,BOTH PMN AND MONONUCLEAR     NO SQUAMOUS EPITHELIAL CELLS SEEN     MODERATE GRAM NEGATIVE RODS   Culture        Value: NO ANAEROBES ISOLATED; CULTURE IN PROGRESS FOR 5 DAYS   Report Status PENDING     GLUCOSE, CAPILLARY     Status: Abnormal   Collection Time   11/29/11  4:31 PM      Component Value Range Comment   Glucose-Capillary 159 (*) 70 - 99 mg/dL    Comment 1 Documented in Chart      Comment 2 Notify RN     GLUCOSE, CAPILLARY     Status: Abnormal   Collection Time   11/29/11  8:14 PM      Component Value Range Comment   Glucose-Capillary 114 (*) 70 - 99 mg/dL   GLUCOSE, CAPILLARY     Status: Abnormal   Collection Time   11/29/11 11:50 PM      Component Value Range Comment   Glucose-Capillary 116 (*) 70 - 99 mg/dL   GLUCOSE, CAPILLARY     Status: Abnormal   Collection Time   11/30/11  4:56 AM      Component Value Range  Comment   Glucose-Capillary 109 (*) 70 - 99 mg/dL   GLUCOSE, CAPILLARY     Status: Abnormal   Collection Time   11/30/11  7:20 AM      Component Value Range Comment   Glucose-Capillary 162 (*) 70 - 99 mg/dL    Comment 1 Notify RN      Comment 2 Documented in Chart     BASIC METABOLIC PANEL     Status: Abnormal   Collection Time   11/30/11  8:45 AM      Component Value Range Comment   Sodium 133 (*) 135 - 145 mEq/L    Potassium 2.8 (*) 3.5 - 5.1 mEq/L    Chloride 98  96 - 112 mEq/L    CO2 28  19 - 32 mEq/L    Glucose, Bld 113 (*) 70 - 99 mg/dL    BUN 8  6 - 23 mg/dL    Creatinine, Ser 1.61  0.50 - 1.35 mg/dL    Calcium 7.7 (*) 8.4 - 10.5 mg/dL    GFR calc non Af Amer >90  >90 mL/min    GFR calc Af Amer >90  >90 mL/min   CBC     Status: Abnormal   Collection Time   11/30/11  8:45 AM      Component Value Range Comment   WBC 11.2 (*) 4.0 - 10.5 K/uL    RBC 3.07 (*) 4.22 - 5.81 MIL/uL    Hemoglobin 8.4 (*) 13.0 - 17.0 g/dL    HCT 09.6 (*) 04.5 - 52.0 %    MCV 86.0  78.0 - 100.0 fL    MCH 27.4  26.0 - 34.0 pg    MCHC 31.8  30.0 - 36.0 g/dL    RDW 40.9 (*) 81.1 - 15.5 %    Platelets 648 (*) 150 - 400 K/uL   GLUCOSE, CAPILLARY     Status: Normal   Collection Time   11/30/11 12:05 PM      Component Value Range Comment  Glucose-Capillary 94  70 - 99 mg/dL    Comment 1 Notify RN      Comment 2 Documented in Chart     GLUCOSE, CAPILLARY     Status: Abnormal   Collection Time   11/30/11  4:21 PM      Component Value Range Comment   Glucose-Capillary 111 (*) 70 - 99 mg/dL    Comment 1 Notify RN      Comment 2 Documented in Chart     GLUCOSE, CAPILLARY     Status: Abnormal   Collection Time   11/30/11  7:36 PM      Component Value Range Comment   Glucose-Capillary 122 (*) 70 - 99 mg/dL   GLUCOSE, CAPILLARY     Status: Abnormal   Collection Time   11/30/11 11:57 PM      Component Value Range Comment   Glucose-Capillary 101 (*) 70 - 99 mg/dL   GLUCOSE, CAPILLARY      Status: Normal   Collection Time   12/01/11  3:44 AM      Component Value Range Comment   Glucose-Capillary 97  70 - 99 mg/dL   CBC     Status: Abnormal   Collection Time   12/01/11  5:10 AM      Component Value Range Comment   WBC 11.5 (*) 4.0 - 10.5 K/uL    RBC 2.95 (*) 4.22 - 5.81 MIL/uL    Hemoglobin 8.1 (*) 13.0 - 17.0 g/dL    HCT 16.1 (*) 09.6 - 52.0 %    MCV 86.8  78.0 - 100.0 fL    MCH 27.5  26.0 - 34.0 pg    MCHC 31.6  30.0 - 36.0 g/dL    RDW 04.5  40.9 - 81.1 %    Platelets 557 (*) 150 - 400 K/uL   BASIC METABOLIC PANEL     Status: Abnormal   Collection Time   12/01/11  5:10 AM      Component Value Range Comment   Sodium 139  135 - 145 mEq/L    Potassium 3.8  3.5 - 5.1 mEq/L    Chloride 104  96 - 112 mEq/L    CO2 29  19 - 32 mEq/L    Glucose, Bld 136 (*) 70 - 99 mg/dL    BUN 9  6 - 23 mg/dL    Creatinine, Ser 9.14  0.50 - 1.35 mg/dL    Calcium 8.2 (*) 8.4 - 10.5 mg/dL    GFR calc non Af Amer >90  >90 mL/min    GFR calc Af Amer >90  >90 mL/min     Radiology/Results: Ct Guided Abscess Drain  11/29/2011  *RADIOLOGY REPORT*  Indication: Residual fluid collections post multiple prior percutaneous drain placements.  CT GUIDED RIGHT LOWER ABDOMINAL QUADRANT DRAINAGE CATHETER PLACEMENT CT GUIDED LEFT UPPER ABDOMINAL QUADRANT DRAINAGE CATHETER PLACEMENT  Comparison: CT abdomen pelvis - 11/29/2011  Medications: Fentanyl 200 mcg IV; Versed 3 mg IV  Total Moderate Sedation time: 43 minutes  Contrast: None  Complications: None immediate  Technique / Findings:  Informed written consent was obtained from the patient after a discussion of the risks, benefits and alternatives to treatment. The patient was placed supine on the CT gantry and a pre procedural CT was performed re-demonstrating the known abscess/fluid collections within the both the right lower and left upper abdominal quadrants.  The procedure was planned.   A timeout was performed prior to the initiation of the procedure.   The  skin overlying the right lower abdominal quadrant was prepped and draped in the usual sterile fashion.   The overlying soft tissues were anesthetized with 1% lidocaine with epinephrine. Appropriate trajectory was planned with the use of a 22 gauge spinal needle.  An 18 gauge trocar needle was advanced into the abscess/fluid collection and a short Amplatz super stiff wire was coiled within the collection.   Appropriate positioning was confirmed with a limited CT scan.  The tract was serially dilated allowing placement of a 10 [place] all-purpose drainage catheter. Appropriate positioning was confirmed with a limited postprocedural CT scan.  25 ml of purulent fluid was aspirated.  The tube was connected to a drainage bag and sutured in place.  A dressing was placed.  Attention was now paid towards the residual fluid collection in the left upper abdominal quadrant.  The skin overlying the left upper abdomen was prepped and draped in the usual sterile fashion.   The overlying soft tissues were anesthetized with 1% lidocaine with epinephrine.  Appropriate trajectory was planned with the use of a 22 gauge spinal needle.  An 18 gauge trocar needle was advanced into the abscess/fluid collection and a short Amplatz super stiff wire was coiled within the collection.   Appropriate positioning was confirmed with a limited CT scan.  The tract was serially dilated allowing placement of a 10 [place] all-purpose drainage catheter.  Appropriate positioning was confirmed with a limited postprocedural CT scan.  75 ml of purulent fluid was aspirated.  The tube was connected to a drainage bag and sutured in place.  A dressing was placed.  The patient tolerated the above procedures well without immediate post procedural complication.  Impression:  Successful CT guided placement of a 10 French all purpose drain catheters into the residual abscesses/fluid collections within the right lower and the left upper abdominal quadrants. Samples  were sent to the laboratory from both drainage catheters as requested by the ordering clinical team.   Original Report Authenticated By: Waynard Reeds, M.D.    Ct Guided Abscess Drain  11/29/2011  *RADIOLOGY REPORT*  Indication: Residual fluid collections post multiple prior percutaneous drain placements.  CT GUIDED RIGHT LOWER ABDOMINAL QUADRANT DRAINAGE CATHETER PLACEMENT CT GUIDED LEFT UPPER ABDOMINAL QUADRANT DRAINAGE CATHETER PLACEMENT  Comparison: CT abdomen pelvis - 11/29/2011  Medications: Fentanyl 200 mcg IV; Versed 3 mg IV  Total Moderate Sedation time: 43 minutes  Contrast: None  Complications: None immediate  Technique / Findings:  Informed written consent was obtained from the patient after a discussion of the risks, benefits and alternatives to treatment. The patient was placed supine on the CT gantry and a pre procedural CT was performed re-demonstrating the known abscess/fluid collections within the both the right lower and left upper abdominal quadrants.  The procedure was planned.   A timeout was performed prior to the initiation of the procedure.  The skin overlying the right lower abdominal quadrant was prepped and draped in the usual sterile fashion.   The overlying soft tissues were anesthetized with 1% lidocaine with epinephrine. Appropriate trajectory was planned with the use of a 22 gauge spinal needle.  An 18 gauge trocar needle was advanced into the abscess/fluid collection and a short Amplatz super stiff wire was coiled within the collection.   Appropriate positioning was confirmed with a limited CT scan.  The tract was serially dilated allowing placement of a 10 [place] all-purpose drainage catheter. Appropriate positioning was confirmed with a limited postprocedural CT scan.  25  ml of purulent fluid was aspirated.  The tube was connected to a drainage bag and sutured in place.  A dressing was placed.  Attention was now paid towards the residual fluid collection in the left upper  abdominal quadrant.  The skin overlying the left upper abdomen was prepped and draped in the usual sterile fashion.   The overlying soft tissues were anesthetized with 1% lidocaine with epinephrine.  Appropriate trajectory was planned with the use of a 22 gauge spinal needle.  An 18 gauge trocar needle was advanced into the abscess/fluid collection and a short Amplatz super stiff wire was coiled within the collection.   Appropriate positioning was confirmed with a limited CT scan.  The tract was serially dilated allowing placement of a 10 [place] all-purpose drainage catheter.  Appropriate positioning was confirmed with a limited postprocedural CT scan.  75 ml of purulent fluid was aspirated.  The tube was connected to a drainage bag and sutured in place.  A dressing was placed.  The patient tolerated the above procedures well without immediate post procedural complication.  Impression:  Successful CT guided placement of a 10 French all purpose drain catheters into the residual abscesses/fluid collections within the right lower and the left upper abdominal quadrants. Samples were sent to the laboratory from both drainage catheters as requested by the ordering clinical team.   Original Report Authenticated By: Waynard Reeds, M.D.     Anti-infectives: Anti-infectives     Start     Dose/Rate Route Frequency Ordered Stop   12/01/11 1000   amoxicillin-clavulanate (AUGMENTIN) 875-125 MG per tablet 1 tablet        1 tablet Oral Every 12 hours 12/01/11 0640     11/22/11 2000   imipenem-cilastatin (PRIMAXIN) 250 mg in sodium chloride 0.9 % 100 mL IVPB        250 mg 200 mL/hr over 30 Minutes Intravenous Every 6 hours 11/22/11 1423 11/30/11 0816   11/22/11 1430   imipenem-cilastatin (PRIMAXIN) 250 mg in sodium chloride 0.9 % 100 mL IVPB        250 mg 200 mL/hr over 30 Minutes Intravenous NOW 11/22/11 1423 11/22/11 1545   11/17/11 1400   vancomycin (VANCOCIN) IVPB 1000 mg/200 mL premix  Status:  Discontinued         1,000 mg 200 mL/hr over 60 Minutes Intravenous Every 8 hours 11/17/11 0802 11/19/11 0934   11/15/11 1400   imipenem-cilastatin (PRIMAXIN) 500 mg in sodium chloride 0.9 % 100 mL IVPB  Status:  Discontinued        500 mg 200 mL/hr over 30 Minutes Intravenous 3 times per day 11/15/11 0931 11/22/11 1423   11/15/11 0615   vancomycin (VANCOCIN) 750 mg in sodium chloride 0.9 % 150 mL IVPB  Status:  Discontinued        750 mg 150 mL/hr over 60 Minutes Intravenous 3 times per day 11/15/11 0608 11/17/11 0801   11/14/11 1800   imipenem-cilastatin (PRIMAXIN) 250 mg in sodium chloride 0.9 % 100 mL IVPB  Status:  Discontinued        250 mg 200 mL/hr over 30 Minutes Intravenous 4 times per day 11/14/11 1434 11/15/11 0931   11/13/11 1800   vancomycin (VANCOCIN) 750 mg in sodium chloride 0.9 % 150 mL IVPB  Status:  Discontinued        750 mg 150 mL/hr over 60 Minutes Intravenous Every 12 hours 11/13/11 0631 11/15/11 0608   11/13/11 0645   vancomycin (VANCOCIN) 750  mg in sodium chloride 0.9 % 150 mL IVPB        750 mg 150 mL/hr over 60 Minutes Intravenous  Once 11/13/11 0631 11/13/11 0738   11/12/11 1200   imipenem-cilastatin (PRIMAXIN) 500 mg in sodium chloride 0.9 % 100 mL IVPB  Status:  Discontinued        500 mg 200 mL/hr over 30 Minutes Intravenous 3 times per day 11/12/11 1015 11/14/11 1434   11/11/11 2000   micafungin (MYCAMINE) 100 mg in sodium chloride 0.9 % 100 mL IVPB     Comments: stat      100 mg 100 mL/hr over 1 Hours Intravenous Every 24 hours 11/10/11 1939 11/29/11 2131   11/11/11 1800   vancomycin (VANCOCIN) 500 mg in sodium chloride 0.9 % 100 mL IVPB  Status:  Discontinued        500 mg 100 mL/hr over 60 Minutes Intravenous Every 12 hours 11/11/11 1014 11/13/11 0631   11/10/11 2000   micafungin (MYCAMINE) 100 mg in sodium chloride 0.9 % 100 mL IVPB     Comments: stat      100 mg 100 mL/hr over 1 Hours Intravenous Daily 11/10/11 1939 11/10/11 2215   11/10/11 2000    imipenem-cilastatin (PRIMAXIN) 250 mg in sodium chloride 0.9 % 100 mL IVPB  Status:  Discontinued        250 mg 200 mL/hr over 30 Minutes Intravenous 4 times per day 11/10/11 1951 11/12/11 1014   11/10/11 1630   fluconazole (DIFLUCAN) IVPB 200 mg  Status:  Discontinued        200 mg 100 mL/hr over 60 Minutes Intravenous Every 24 hours 11/10/11 1617 11/10/11 1939   11/10/11 1630   vancomycin (VANCOCIN) 500 mg in sodium chloride 0.9 % 100 mL IVPB  Status:  Discontinued        500 mg 100 mL/hr over 60 Minutes Intravenous 3 times per day 11/10/11 1617 11/11/11 1014   11/10/11 1630   piperacillin-tazobactam (ZOSYN) IVPB 3.375 g  Status:  Discontinued        3.375 g 12.5 mL/hr over 240 Minutes Intravenous 3 times per day 11/10/11 1617 11/10/11 1941   11/10/11 0933   cefOXitin (MEFOXIN) 2 g in dextrose 5 % 50 mL IVPB        2 g 100 mL/hr over 30 Minutes Intravenous 60 min pre-op 11/10/11 0926 11/10/11 1105   11/10/11 0930   cefOXitin (MEFOXIN) 1 g in dextrose 5 % 50 mL IVPB  Status:  Discontinued        1 g 100 mL/hr over 30 Minutes Intravenous 3 times per day 11/10/11 0924 11/10/11 0926   11/07/11 2200   cefTRIAXone (ROCEPHIN) 1 g in dextrose 5 % 50 mL IVPB  Status:  Discontinued        1 g 100 mL/hr over 30 Minutes Intravenous Every 24 hours 11/07/11 2124 11/10/11 1536   11/07/11 1830   cefTRIAXone (ROCEPHIN) 1 g in dextrose 5 % 50 mL IVPB        1 g 100 mL/hr over 30 Minutes Intravenous  Once 11/07/11 1819 11/07/11 1920          Assessment/Plan: Problem List: Patient Active Problem List  Diagnosis  . ADENOCARCINOMA, RECTUM, [id number]  . SBO (small bowel obstruction), closed loop - s/p SB resection 11/10/2011  . Hypertension  . Tobacco abuse  . 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 Acute respiratory failure following  trauma and surgery  . Peritonitis  . Aspiration pneumonia  . Hypotension  . Septic shock(785.52)    . Severe sepsis(995.92)  . Acute renal insufficiency  . Protein calorie malnutrition  . Abscess of male pelvis   Work toward home hopefully tomorrow Care management consult for wound vac Working on PO pain meds.  PO augmentin.   Benadryl for sleep.          LOS: 24 days   [facility]  [phone]  12/01/2011 6:44 AM

## 2011-12-02 ENCOUNTER — Telehealth: Payer: Self-pay | Admitting: Oncology

## 2011-12-02 ENCOUNTER — Other Ambulatory Visit (INDEPENDENT_AMBULATORY_CARE_PROVIDER_SITE_OTHER): Payer: Self-pay | Admitting: General Surgery

## 2011-12-02 ENCOUNTER — Other Ambulatory Visit: Payer: Self-pay | Admitting: *Deleted

## 2011-12-02 DIAGNOSIS — IMO0002 Reserved for concepts with insufficient information to code with codable children: Secondary | ICD-10-CM

## 2011-12-02 LAB — BASIC METABOLIC PANEL
BUN: 9 mg/dL (ref 6–23)
Creatinine, Ser: 0.64 mg/dL (ref 0.50–1.35)
GFR calc Af Amer: >90 mL/min (ref >90)
GFR calc non Af Amer: >90 mL/min (ref >90)
Potassium: 4.1 mEq/L (ref 3.5–5.1)

## 2011-12-02 LAB — CBC
HCT: 24.9 % — ABNORMAL LOW (ref 39.0–52.0)
MCHC: 31.3 g/dL (ref 30.0–36.0)
Platelets: 540 10*3/uL — ABNORMAL HIGH (ref 150–400)
RDW: 15.9 % — ABNORMAL HIGH (ref 11.5–15.5)
WBC: 11.4 10*3/uL — ABNORMAL HIGH (ref 4.0–10.5)

## 2011-12-02 MED ORDER — AMOXICILLIN-POT CLAVULANATE 875-125 MG PO TABS: 1.0000 | ORAL_TABLET | Freq: Two times a day (BID) | ORAL | Status: DC

## 2011-12-02 MED ORDER — POTASSIUM CHLORIDE CRYS ER 20 MEQ PO TBCR: 40.0000 meq | EXTENDED_RELEASE_TABLET | Freq: Two times a day (BID) | ORAL | Status: DC

## 2011-12-02 MED ORDER — ACETAMINOPHEN 325 MG PO TABS: 325.0000 mg | ORAL_TABLET | Freq: Four times a day (QID) | ORAL | Status: DC | PRN

## 2011-12-02 MED ORDER — OXYCODONE HCL 5 MG PO TABS: 5.0000 mg | ORAL_TABLET | ORAL | Status: DC | PRN

## 2011-12-02 MED ORDER — DIPHENHYDRAMINE HCL 50 MG PO CAPS: ORAL_CAPSULE | ORAL | Status: DC

## 2011-12-02 MED ORDER — ENSURE COMPLETE PO LIQD: 237.0000 mL | Freq: Three times a day (TID) | ORAL | Status: DC

## 2011-12-02 MED ORDER — PANTOPRAZOLE SODIUM 40 MG PO TBEC: 40.0000 mg | DELAYED_RELEASE_TABLET | Freq: Every day | ORAL | Status: DC

## 2011-12-02 NOTE — Progress Notes (Signed)
PICC line d/c'ed per order. Pt states he has had them before and is familiar with removal and after care.  Verbalizes understanding of site care, signs of infection, when to call doctor and to apply pressure if bleeding at the site occurs.  No ADN.  No signs of infection noted.  Vaseline and guaze applied to site with occlusive dressing.

## 2011-12-02 NOTE — Discharge Summary (Addendum)
Physician Discharge Summary  Patient ID: Jeffery [name] MRN: [id number] DOB/AGE: 05/24/1960 51 y.o.  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  He had NGT placed and was placed on TNA.  He received irrigations via his colostomy without results.  In 72 hours, he remained distended, and had not had any significant output from his colostomy.  He also had no flatus from his ostomy.  He was taken to the OR and underwent exploratory laparotomy.  He was found to have a closed loop obstruction in the pelvis.  He had spillage of enteric contents due to the level of distention.  He had post op respiratory failure.  He thereafter developed sepsis and renal insufficiency.  He gradually improved with IV antibiotics and supportive care.  At one point, it was thought that he may not survive the sepsis and a family meeting was called.  He was made DNR.  He was eventually able to be extubated and tolerate enteric feedings.  He was able to eat as well.  He did required multiple perc drains for intraperitoneal abscesses.  He was placed back on his amlodipine prior to D/C.  He also had a wound vac.  He was transitioned to wet to dry dressings prior to d/c.  He was also placed on Augmentin.   Consults: pulmonary/intensive care  Significant Diagnostic  Studies: microbiology: wound culture: positive for GNR from abscesses, final culture pending.  Treatments: antibiotics: vancomycin and cefepime, TPN, procedures: percutaneous drainage catheter placement and surgery: Ex lap and small bowel resection.  Discharge Exam: Blood pressure 147/107, pulse 110, temperature 98.9 F (37.2 C), temperature source Oral, resp. rate 18, height 6\' 2"  (1.88 m), weight 118 lb 14.4 oz (53.933 kg), SpO2 100.00%. General appearance: alert, cooperative and no distress Resp: no respiratory distress GI: soft, sl distended, non tender, incision almost healed, ostomy pink Extremities: extremities normal, atraumatic, no cyanosis or edema  Disposition: 06-Home-Health Care Svc     Medication List     As of 12/02/2011  7:49 AM    ASK your doctor about these medications         amLODipine 5 MG tablet   Commonly known as: NORVASC   Take 2 tablets (10 mg total) by mouth daily.      lidocaine-prilocaine cream   Commonly known as: EMLA   Apply topically as needed. Apply to [facility] site 1-2 hours prior to stick and cover with plastic wrap      oxyCODONE-acetaminophen 10-325 MG per tablet   Commonly known as: PERCOCET   Take 1-2 tablets by mouth every 4 (four) hours as needed for pain.      PRESCRIPTION MEDICATION   Pt's a chemo patient and gets chemo at [facility]. Last treatment was 2 months ago. Pt states his next scheduled chemo is in September 2013. Followed by Dr Truett Perna           Follow-up Information  Follow up with [facility]. (Will continue to provide [facility] RN )    Contact information:   [address] [place] [address] [phone]         Signed: Almond Lint 12/02/2011, 7:49 AM

## 2011-12-02 NOTE — Telephone Encounter (Signed)
Talked to patient's wife gave her appt date for 12/06/11 ML only

## 2011-12-03 ENCOUNTER — Telehealth: Payer: Self-pay | Admitting: *Deleted

## 2011-12-03 ENCOUNTER — Encounter (INDEPENDENT_AMBULATORY_CARE_PROVIDER_SITE_OTHER): Payer: Medicaid Other | Admitting: General Surgery

## 2011-12-03 LAB — CULTURE, ROUTINE-ABSCESS

## 2011-12-03 NOTE — Telephone Encounter (Signed)
Informed patient that he has #2 refills on his amlodipine at [facility]. Can pick up today. Also gave him appointment for 10/21 at 12:15

## 2011-12-04 LAB — ANAEROBIC CULTURE

## 2011-12-06 ENCOUNTER — Ambulatory Visit (HOSPITAL_BASED_OUTPATIENT_CLINIC_OR_DEPARTMENT_OTHER): Payer: Medicaid Other | Admitting: Nurse Practitioner

## 2011-12-06 ENCOUNTER — Telehealth: Payer: Self-pay | Admitting: Oncology

## 2011-12-06 VITALS — BP 136/92 | HR 120 | Temp 97.9°F | Resp 20 | Ht 74.0 in | Wt 113.1 lb

## 2011-12-06 DIAGNOSIS — C784 Secondary malignant neoplasm of small intestine: Secondary | ICD-10-CM

## 2011-12-06 DIAGNOSIS — C20 Malignant neoplasm of rectum: Secondary | ICD-10-CM

## 2011-12-06 DIAGNOSIS — R5383 Other fatigue: Secondary | ICD-10-CM

## 2011-12-06 DIAGNOSIS — C19 Malignant neoplasm of rectosigmoid junction: Secondary | ICD-10-CM

## 2011-12-06 DIAGNOSIS — C7800 Secondary malignant neoplasm of unspecified lung: Secondary | ICD-10-CM

## 2011-12-06 LAB — ANAEROBIC CULTURE

## 2011-12-06 NOTE — Progress Notes (Signed)
OFFICE PROGRESS NOTE  Interval history:  Jeffery [name] is a 51 year old man with metastatic rectal cancer. He was last treated with FOLFIRI chemotherapy on 09/02/2011. Chest CT 10/07/2011 showed increased size and number of lung nodules.  Most recently he was hospitalized on 11/07/2011 with a recurrent small bowel obstruction. He was taken to the OR on 11/10/2011 and underwent an exploratory laparotomy with small bowel resection x2 and incidental appendectomy. He was found to have a closed loop obstruction.   Pathology on #1 small bowel resection showed focal perforation with no evidence of malignancy; pathology on #2 small bowel resection showed metastatic adenocarcinoma consistent with colorectal primary; pathology on the appendix showed serosal fibrous adhesions with no evidence of malignancy.  He developed postoperative respiratory failure, sepsis and renal insufficiency. He improved gradually and was able to be extubated. He required placement of multiple percutaneous drains for intraperitoneal abscesses. He was discharged home on 12/02/2011.  [name] reports he is "weak". His appetite is "so-so". He has 3 percutaneous drains. All drains continue to have output. He denies fever. He continues Augmentin. He denies nausea/vomiting. Colostomy is functioning. He denies shortness of breath or cough.   Objective: Blood pressure 136/92, pulse 120, temperature 97.9 F (36.6 C), resp. rate 20, height 6\' 2"  (1.88 m), weight 113 lb 1.6 oz (51.302 kg).  Oropharynx is without thrush or ulceration. Lungs are clear. Regular cardiac rhythm. Port-A-Cath site is without erythema. Abdomen is soft. He has a colostomy. Midline abdominal wound is healing. The 3 drainage sites were covered with dressings.  Lab Results: Lab Results  Component Value Date   WBC 11.4* 12/02/2011   HGB 7.8* 12/02/2011   HCT 24.9* 12/02/2011   MCV 86.5 12/02/2011   PLT 540* 12/02/2011    Chemistry:    Chemistry      Component  Value Date/Time   NA 136 12/02/2011 0600   K 4.1 12/02/2011 0600   CL 100 12/02/2011 0600   CO2 29 12/02/2011 0600   BUN 9 12/02/2011 0600   CREATININE 0.64 12/02/2011 0600      Component Value Date/Time   CALCIUM 8.3* 12/02/2011 0600   ALKPHOS 58 11/29/2011 0625   AST 13 11/29/2011 0625   ALT 13 11/29/2011 0625   BILITOT 0.3 11/29/2011 0625       Studies/Results: Ct Abdomen Pelvis Wo Contrast  11/20/2011  *RADIOLOGY REPORT*  Clinical Data: Small bowel obstruction, intra abdominal fluid collections  CT ABDOMEN AND PELVIS WITHOUT CONTRAST  Technique:  Multidetector CT imaging of the abdomen and pelvis was performed following the standard protocol without intravenous contrast.  Comparison:   the previous day's study  Findings: Multiple pulmonary nodules in the visualized lung bases. Nasogastric tube extends into decompressed stomach.  Persistent left upper quadrant peritoneal fluid collection in measuring at least 10 x 15.5 cm, displacing the spleen medially.  Persistent large collection just cephalad to the urinary bladder measuring up to 20 cm   diameter.  Stable right transgluteal drainage catheter which was probably withdrawn partially since its original placement.  Unremarkable uninfused evaluation of liver, spleen, adrenal glands, pancreas.  There is persistent residual contrast in the decompressed renal collecting systems.  Patchy areas of persistent nephrogram in the left kidney which can be seen in the setting of pyelonephritis.  Atheromatous abdominal aorta.  There are dilated loops of bowel in the mid abdomen measuring up to 4.7 cm diameter at the level of anastomotic staple line.  There is residual oral contrast material in the decompressed  colon.  There is a left lower quadrant diverting colostomy.  IMPRESSION:  1.  Some interval increase in degree of distention of mid abdominal small bowel loops.  Limited evaluation secondary to lack of oral contrast.  Unenhanced CT was performed per  clinician order.  Lack of IV contrast limits sensitivity and specificity, especially for evaluation of abdominal/pelvic solid viscera. 2.  Little change in large peritoneal fluid collections previously described.   Original Report Authenticated By: Osa Craver, M.D.    X-ray Chest Pa Or Ap  11/10/2011  *RADIOLOGY REPORT*  Clinical Data: Endotracheal tube placement  CHEST - 1 VIEW  Comparison: CT chest dated 10/07/2011  Findings: Endotracheal tube terminates 3 cm above the carina.  Enteric tube courses below the diaphragm.  Right chest power port and right arm PICC terminates in the lower SVC / cavoatrial junction.  Right lung metastases, the largest measuring approximate 2.4 cm in the right lower lung.  Known left lung metastases are not well visualized radiographically.  Additional patchy right lower lobe opacity, likely atelectasis with possible small right pleural effusion.  No pneumothorax.  The heart is normal in size.  IMPRESSION: Endotracheal tube terminates 3 cm above the carina.  Additional support apparatus as above.  Right lung metastases.  Known left lung metastases are not well visualized radiographically.  Patchy right lower lobe opacity, likely atelectasis with a possible small right pleural effusion.   Original Report Authenticated By: Charline Bills, M.D.    Dg Abd 1 View  11/12/2011  *RADIOLOGY REPORT*  Clinical Data: Nasogastric tube placement.  PORTABLE ABDOMEN - 1 VIEW  Comparison: Portable abdomen x-rays 11/09/2011, 11/08/2011.  Findings: Nasogastric tube tip overlies the expected location of the distal body of the stomach.  Bowel gas pattern much improved from prior examinations.  IMPRESSION: Nasogastric tube tip at the expected location of the distal body of the stomach.   Original Report Authenticated By: Arnell Sieving, M.D.    Dg Abd 1 View  11/09/2011  *RADIOLOGY REPORT*  Clinical Data: Small bowel obstruction.  Small bowel dilation.  ABDOMEN - 1 VIEW  Comparison:  11/08/2011.  Findings: Nasogastric tube is present in the proximal stomach. There is a persistent dilated loop of small bowel in the central abdomen which measures 8.3 cm, similar yesterday's exam.  There is now a second dilated loop central to the larger dilated loop. Pigtail drainage catheter is present in the right lower quadrant. Staples are present in the region of the rectosigmoid.  IMPRESSION:  1. Persistent  small bowel obstruction.  Maximal diameter is 84 mm. 2.  Nasogastric tube in the proximal stomach. 3.  Pigtail drainage catheter in the pelvis is unchanged.   Original Report Authenticated By: Andreas Newport, M.D.    Dg Abd 1 View  11/08/2011  *RADIOLOGY REPORT*  Clinical Data: Evaluate NG tube placement  ABDOMEN - 1 VIEW  Comparison: 11/07/2011  Findings: No enteric tube is seen.  Dilated loop of small bowel in the mid abdomen, unchanged, compatible with small bowel obstruction.  Pigtail drainage catheter terminating in the right pelvis.  2.2 cm nodule in the right mid/lower lung, suspicious for metastasis, grossly unchanged.  IMPRESSION: No enteric tube is seen.  Stable small bowel obstruction.   Original Report Authenticated By: Charline Bills, M.D.    Ct Guided Abscess Drain  11/29/2011  *RADIOLOGY REPORT*  Indication: Residual fluid collections post multiple prior percutaneous drain placements.  CT GUIDED RIGHT LOWER ABDOMINAL QUADRANT DRAINAGE CATHETER PLACEMENT CT GUIDED LEFT  UPPER ABDOMINAL QUADRANT DRAINAGE CATHETER PLACEMENT  Comparison: CT abdomen pelvis - 11/29/2011  Medications: Fentanyl 200 mcg IV; Versed 3 mg IV  Total Moderate Sedation time: 43 minutes  Contrast: None  Complications: None immediate  Technique / Findings:  Informed written consent was obtained from the patient after a discussion of the risks, benefits and alternatives to treatment. The patient was placed supine on the CT gantry and a pre procedural CT was performed re-demonstrating the known abscess/fluid  collections within the both the right lower and left upper abdominal quadrants.  The procedure was planned.   A timeout was performed prior to the initiation of the procedure.  The skin overlying the right lower abdominal quadrant was prepped and draped in the usual sterile fashion.   The overlying soft tissues were anesthetized with 1% lidocaine with epinephrine. Appropriate trajectory was planned with the use of a 22 gauge spinal needle.  An 18 gauge trocar needle was advanced into the abscess/fluid collection and a short Amplatz super stiff wire was coiled within the collection.   Appropriate positioning was confirmed with a limited CT scan.  The tract was serially dilated allowing placement of a 10 [place] all-purpose drainage catheter. Appropriate positioning was confirmed with a limited postprocedural CT scan.  25 ml of purulent fluid was aspirated.  The tube was connected to a drainage bag and sutured in place.  A dressing was placed.  Attention was now paid towards the residual fluid collection in the left upper abdominal quadrant.  The skin overlying the left upper abdomen was prepped and draped in the usual sterile fashion.   The overlying soft tissues were anesthetized with 1% lidocaine with epinephrine.  Appropriate trajectory was planned with the use of a 22 gauge spinal needle.  An 18 gauge trocar needle was advanced into the abscess/fluid collection and a short Amplatz super stiff wire was coiled within the collection.   Appropriate positioning was confirmed with a limited CT scan.  The tract was serially dilated allowing placement of a 10 [place] all-purpose drainage catheter.  Appropriate positioning was confirmed with a limited postprocedural CT scan.  75 ml of purulent fluid was aspirated.  The tube was connected to a drainage bag and sutured in place.  A dressing was placed.  The patient tolerated the above procedures well without immediate post procedural complication.  Impression:  Successful CT  guided placement of a 10 French all purpose drain catheters into the residual abscesses/fluid collections within the right lower and the left upper abdominal quadrants. Samples were sent to the laboratory from both drainage catheters as requested by the ordering clinical team.   Original Report Authenticated By: Waynard Reeds, M.D.    Ct Guided Abscess Drain  11/29/2011  *RADIOLOGY REPORT*  Indication: Residual fluid collections post multiple prior percutaneous drain placements.  CT GUIDED RIGHT LOWER ABDOMINAL QUADRANT DRAINAGE CATHETER PLACEMENT CT GUIDED LEFT UPPER ABDOMINAL QUADRANT DRAINAGE CATHETER PLACEMENT  Comparison: CT abdomen pelvis - 11/29/2011  Medications: Fentanyl 200 mcg IV; Versed 3 mg IV  Total Moderate Sedation time: 43 minutes  Contrast: None  Complications: None immediate  Technique / Findings:  Informed written consent was obtained from the patient after a discussion of the risks, benefits and alternatives to treatment. The patient was placed supine on the CT gantry and a pre procedural CT was performed re-demonstrating the known abscess/fluid collections within the both the right lower and left upper abdominal quadrants.  The procedure was planned.   A timeout was  performed prior to the initiation of the procedure.  The skin overlying the right lower abdominal quadrant was prepped and draped in the usual sterile fashion.   The overlying soft tissues were anesthetized with 1% lidocaine with epinephrine. Appropriate trajectory was planned with the use of a 22 gauge spinal needle.  An 18 gauge trocar needle was advanced into the abscess/fluid collection and a short Amplatz super stiff wire was coiled within the collection.   Appropriate positioning was confirmed with a limited CT scan.  The tract was serially dilated allowing placement of a 10 [place] all-purpose drainage catheter. Appropriate positioning was confirmed with a limited postprocedural CT scan.  25 ml of purulent fluid was  aspirated.  The tube was connected to a drainage bag and sutured in place.  A dressing was placed.  Attention was now paid towards the residual fluid collection in the left upper abdominal quadrant.  The skin overlying the left upper abdomen was prepped and draped in the usual sterile fashion.   The overlying soft tissues were anesthetized with 1% lidocaine with epinephrine.  Appropriate trajectory was planned with the use of a 22 gauge spinal needle.  An 18 gauge trocar needle was advanced into the abscess/fluid collection and a short Amplatz super stiff wire was coiled within the collection.   Appropriate positioning was confirmed with a limited CT scan.  The tract was serially dilated allowing placement of a 10 [place] all-purpose drainage catheter.  Appropriate positioning was confirmed with a limited postprocedural CT scan.  75 ml of purulent fluid was aspirated.  The tube was connected to a drainage bag and sutured in place.  A dressing was placed.  The patient tolerated the above procedures well without immediate post procedural complication.  Impression:  Successful CT guided placement of a 10 French all purpose drain catheters into the residual abscesses/fluid collections within the right lower and the left upper abdominal quadrants. Samples were sent to the laboratory from both drainage catheters as requested by the ordering clinical team.   Original Report Authenticated By: Waynard Reeds, M.D.    Ct Guided Abscess Drain  11/21/2011  *RADIOLOGY REPORT*  Clinical Data: Status post bowel resection surgery and history of metastatic rectal carcinoma.  The patient has developed peritoneal fluid collections now requiring drainage.  CT GUIDED DRAINAGE OF PERITONEAL ABSCESS  Sedation:  4.0 mg IV Versed;  200 mcg IV Fentanyl  Total Moderate Sedation Time: 15 minutes.  Procedure:  The procedure, risks, benefits, and alternatives were explained to the patient.  Questions regarding the procedure were encouraged  and answered. The patient understands and consents to the procedure.  The left lower quadrant abdominal wall was prepped with Betadine in a sterile fashion, and a sterile drape was applied covering the operative field.  A sterile gown and sterile gloves were used for the procedure. Local anesthesia was provided with 1% Lidocaine.  An 18 gauge needle was advanced into fluid within the left lower peritoneal cavity. Fluid was aspirated and a sample sent for culture analysis.  A guidewire was advanced.  The tract was dilated and a 12-French pigtail drainage catheter placed.  This catheter was connected to a suction bulb.  The catheter was secured at the skin with a Prolene retention suture.  Complications: None  Findings: Aspirated fluid was thin and amber in color.  A drain was placed in the left lower quadrant.  This does appear to ultimately communicate with another dominant pocket of fluid in the upper quadrant.  IMPRESSION: CT guided drainage of the left lower quadrant peritoneal fluid collection fluid appears non purulent and thin in nature.  A sample was sent for culture analysis. A 12 French drainage catheter was placed and connected to a suction bulb.   Original Report Authenticated By: Reola Calkins, M.D.    Ct Abdomen Pelvis W Contrast  11/28/2011  *RADIOLOGY REPORT*  Clinical Data: Rectal adenocarcinoma.  Recent bowel surgery. Follow-up abdominal and pelvic abscesses.  CT ABDOMEN AND PELVIS WITH CONTRAST  Technique:  Multidetector CT imaging of the abdomen and pelvis was performed following the standard protocol during bolus administration of intravenous contrast.  Contrast: OMNIPAQUE IOHEXOL 300 MG/ML  SOLN  Comparison: 11/20/2011  Findings: A new left lower quadrant percutaneous drainage catheter is seen just deep to the rectus abdominis muscles. There is been near complete resolution of the anterior pelvic fluid collection seen in this region. There has also been interval decrease in the  size of a communicating fluid collection in the anterior left abdomen extending into the left subphrenic space.  A persistent, separately loculated fluid and gas collection is seen in the right lower quadrant in the upper pelvis which measures 4.8 x 9.8 cm.  Adjacent anastomotic staples are noted in the right lower quadrant.  A right transgluteal drainage catheter has been pulled back into the gluteus maximus muscle, and there has been reappearance of a small fluid and tail gas collection in the right perirectal region measuring 2.0 x 3.1 cm.  Diffuse dilatation of small bowel and colonic distention is again demonstrated, consistent with ileus.  No evidence of bowel obstruction.  Left lower quadrant colostomy again noted with orally administered contrast reaching the colostomy site.  Mild right hydronephrosis is seen which is increased since previous study and is likely related to the right lower quadrant fluid collection and associated inflammatory changes. A small right renal cyst is again noted.  The abdominal parenchymal organs are otherwise unremarkable appearance.  Gallbladder appears normal.  Images of the lung bases again show diffuse bilateral pulmonary metastases.  Small left pleural effusion and pericardial effusion are mildly increased in size.  IMPRESSION:  1.  Near complete resolution of left lower quadrant fluid collection, and decreased size of communicating left anterior abdominal and left subphrenic collections following drainage catheter placement. 2.  Persistent 5 x 10 cm fluid and gas collection in the right lower quadrant and upper pelvis, consistent with a separately loculated fluid collection or abscess. 3.  Right transgluteal drainage catheter has been pulled back into the gluteus muscle, with reappearance of 2 x 3 cm fluid and gas collection in the right perirectal region. 4.  Increased mild right hydronephrosis. 5.  Persistent adynamic ileus. 6.  Increased small left pleural effusion and  pericardial effusion. 7.  Stable bibasilar pulmonary metastases.   Original Report Authenticated By: Danae Orleans, M.D.    Ct Abdomen Pelvis W Contrast  11/19/2011  *RADIOLOGY REPORT*  Clinical Data: Postoperative fever and leukocytosis. 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  CT ABDOMEN AND PELVIS WITH CONTRAST  Technique:  Multidetector CT imaging of the abdomen and pelvis was performed following the standard protocol during bolus administration of intravenous contrast.  Contrast: OMNIPAQUE IOHEXOL 300 MG/ML  SOLN  Comparison: 10/12/2011.  Findings: Increased size and number of multiple masses in both lungs.  The largest is in the right lower lobe, measuring 1.8 cm in maximum diameter on image #1.  The previously demonstrated transgluteal drainage catheter in the presacral  space is more laterally positioned on the right at this time.  There is some fluid in the prerectal space, measuring 6.7 x 2.9 cm on image number 81.  Interval development of a large collection of fluid in the left upper abdomen, beneath the left hemidiaphragm, displacing the spleen and stomach to the right.  On image number 18, this measures 15.7 x 10.7 cm in maximum dimensions.  On coronal image number 57, this measures 10.7 cm in length. This has a thin surrounding enhancing rim.  Interval development of a large collection of fluid in the pelvis and lower abdomen, measuring 22.4 x 13.9 cm in maximum dimensions on coronal image number 30 and 9.8 cm in AP diameter on axial image number 73.  On coronal image number 34, this measures 21.9 x 21.1 cm in maximum dimensions.  This also has a thin surrounding enhancing rim.  Nasogastric tube tip in the distal stomach.  No dilated bowel loops are seen at this time.  The liver, pancreas, adrenal glands, kidneys and urinary bladder are unremarkable.  A Foley catheter is in the bladder.  Normal sized prostate gland.  The gallbladder is poorly distended with diffuse wall enhancement  and small amount of pericholecystic fluid.  There is also a small amount of free peritoneal fluid in addition to the fluid collections described above.  Mild left lower lobe atelectasis.  Unremarkable bones.  IMPRESSION:  1.  Two very large abscesses in the abdomen and pelvis, as described above. 2.  Interval displacement of the presacral drainage catheter with redevelopment of a small amount of fluid/abscess in the presacral space. 3.  Progressive pulmonary metastatic disease. 4.  Mild left lower lobe atelectasis. 5.  Small amount of free peritoneal fluid, including pericholecystic fluid.  These results will be called to the ordering clinician or representative by the Radiologist Assistant, and communication documented in the PACS Dashboard.   Original Report Authenticated By: Darrol Angel, M.D.    Dg Chest Port 1 View  11/22/2011  *RADIOLOGY REPORT*  Clinical Data: Intubation, rectal carcinoma with pulmonary metastases, hypertension  PORTABLE CHEST - 1 VIEW  Comparison: Portable exam 0709 hours compared to 11/22/2011  Findings: Tip of endotracheal tube 3.3 cm above carina. Nasogastric tube into stomach. Right subclavian power port, tip projecting over SVC. Normal heart size, mediastinal contours, and pulmonary vascularity. Minimal bibasilar atelectasis. Lungs otherwise clear. 2.1 x 2.1 cm right perihilar nodule/metastasis again noted. Additional subtle nodular foci in right lung. No definite pleural effusion or pneumothorax. No acute osseous findings.  IMPRESSION: Minimal bibasilar atelectasis. Pulmonary metastases. Satisfactory line and tube positions.   Original Report Authenticated By: Lollie Marrow, M.D.    Dg Chest Port 1 View  11/22/2011  *RADIOLOGY REPORT*  Clinical Data: 51 year old male endotracheal tube placement. Metastatic adenocarcinoma.  Respiratory failure.  PORTABLE CHEST - 1 VIEW  Comparison: 11/21/2011 and earlier.  Findings: Portable semi upright AP view 0445 hours.  Endotracheal tube  has been advanced.  Tip at the right mainstem bronchus. Enteric tube courses to the abdomen, tip not included.  Stable right chest Port-A-Cath and right PICC line.  Increased opacity at the left lung base.  Stable lung volumes overall.  Cardiac size and mediastinal contours are within normal limits.  No pneumothorax.  No large pleural effusion.  Bilateral pulmonary masses.  IMPRESSION: 1.  Right mainstem bronchus intubation.  Retract the endotracheal tube 4-5 cm for optimal placement. 2.  Increased left lung base atelectasis. 3.  Pulmonary metastatic disease.  Study discussed by telephone with RN Verl Blalock on 11/22/2011 at 0545 hours.   Original Report Authenticated By: Harley Hallmark, M.D.    Dg Chest Port 1 View  11/21/2011  *RADIOLOGY REPORT*  Clinical Data: Endotracheal tube placement.  PORTABLE CHEST - 1 VIEW  Comparison: 11/20/2011  Findings: Endotracheal tube is in place with tip approximate 3.6 cm above carina.  Nasogastric tube is in place with tip off the film but beyond the gastroesophageal junction.  Right-sided power port tip overlies the level of the upper right atrium.  The  Heart size is normal.  There is mild interstitial edema versus pulmonary infiltrates.  IMPRESSION:  1.  Endotracheal tube as described. 2.  Pulmonary infiltrates versus edema.   Original Report Authenticated By: Patterson Hammersmith, M.D.    Dg Chest Port 1 View  11/20/2011  *RADIOLOGY REPORT*  Clinical Data: Check ET tube position.  Hypertension.  No new lung mets from rectal carcinoma.  PORTABLE CHEST - 1 VIEW  Comparison: 11/19/2011  Findings: Endotracheal tube tip lies 3.3 cm above the carina, without significant change, well positioned.  Nasogastric tube passes below the diaphragm into the stomach. Right anterior chest wall power Port-A-Cath has its tips at the caval atrial junction.  Cardiac silhouette is normal in size and configuration.  No mediastinal or hilar mass or adenopathy.  At least three discrete right lung  nodules are noted, stable.  Mild atelectasis at the medial left lung base.  Lungs are otherwise clear.  IMPRESSION: Lines and tubes are well positioned and stable.  No acute cardiopulmonary disease.   Original Report Authenticated By: Domenic Moras, M.D.    Dg Chest Port 1 View  11/19/2011  *RADIOLOGY REPORT*  Clinical Data: ETT placement, mets  PORTABLE CHEST - 1 VIEW  Comparison: 10.  1013  Findings: Endotracheal tube terminates 3 cm above the carina.  Low lung volumes with increased interstitial markings.  Right lower lobe nodule.  Additional smaller bilateral nodular opacities/metastases, better visualized on CT. The effusion  Cardiomediastinal silhouette is within normal limits.  Stable right chest power port and right arm PICC.  IMPRESSION: Endotracheal tube terminates 3 cm above the carina.  Otherwise unchanged.   Original Report Authenticated By: Charline Bills, M.D.    Dg Chest Port 1 View  11/18/2011  *RADIOLOGY REPORT*  Clinical Data: Evaluate endotracheal tube positioning  PORTABLE CHEST - 1 VIEW  Comparison: 11/17/2011; 11/16/2011; 11/15/2011  Findings: Grossly unchanged cardiac silhouette and mediastinal contours.  Stable positioning of support apparatus.  Lung volumes remain persistently reduced.  Known scattered bilateral pulmonary nodules/masses are grossly unchanged.  Grossly unchanged bibasilar heterogeneous opacities, likely atelectasis.  No new focal airspace opacities.  No definite pleural effusion or pneumothorax. Unchanged bones.  IMPRESSION: 1.  Stable positioning of support apparatus.  No pneumothorax. 2.  Grossly unchanged bilateral pulmonary metastasis.   Original Report Authenticated By: Waynard Reeds, M.D.    Dg Chest Port 1 View  11/17/2011  *RADIOLOGY REPORT*  Clinical Data: Edema, intubated  PORTABLE CHEST - 1 VIEW  Comparison: Prior chest x-ray yesterday, 11/16/2011  Findings: Endotracheal tube is in good position 2.6 cm above the carina.  Right subclavian approach  portacatheter and right upper extremity PICC are unchanged.  Both catheter tips terminate at the superior cavoatrial junction.  A nasogastric tube is present, the tip lies off the field of view, likely within the stomach. Improving aeration over the last 24 hours with decreased interstitial edema.  There is persistent  mild interstitial edema and bilateral perihilar infiltrates.  Inspiratory volumes remain low.  Unchanged cardiomegaly.  IMPRESSION:  1.  Improving, but not completely resolved pulmonary edema 2.  Stable and satisfactory support apparatus as above   Original Report Authenticated By: Alvino Blood Chest Port 1 View  11/16/2011  *RADIOLOGY REPORT*  Clinical Data: Evaluate endotracheal tube. Rectal adenocarcinoma with lung metastasis.  PORTABLE CHEST - 1 VIEW  Comparison: 1 day prior  Findings: Endotracheal tube unchanged in position, with tip 2.5 cm above carina.  Nasogastric tube extends beyond the  inferior aspect of the film.  Right-sided Port-A-Cath and right-sided PICC lines are unchanged.  Normal heart size.  Mild left hemidiaphragm elevation.  Cannot exclude small left pleural effusion. No pneumothorax.  No significant change in bilateral interstitial and airspace opacities.  Superimposed upon pulmonary metastasis.  IMPRESSION:  1. No significant change since one day prior. 2.  Pulmonary edema versus less likely infection superimposed upon pulmonary metastasis. 3.  Stable and appropriate position of endotracheal tube.   Original Report Authenticated By: Consuello Bossier, M.D.    Dg Chest Port 1 View  11/15/2011  *RADIOLOGY REPORT*  Clinical Data: Effusion, endotracheal tube placement.  PORTABLE CHEST - 1 VIEW  Comparison: 11/14/2011.  Findings: Endotracheal tube terminates approximately 2.8 cm above the carina.  Nasogastric tube is followed into the stomach with the tip projecting beyond the inferior boundary of the film.  Right subclavian power port tip and right PICC tip project over the lower  SVC or SVC/RA junction.  Lungs are somewhat low in volume with diffuse bilateral air space disease, stable. Underlying pulmonary nodules are relatively obscured.  Probable left pleural effusion.  IMPRESSION: Edema or pneumonia superimposed on pulmonary metastatic disease.   Original Report Authenticated By: Reyes Ivan, M.D.    Dg Chest Port 1 View  11/14/2011  *RADIOLOGY REPORT*  Clinical Data: Endotracheal tube placement.  Bilateral airspace disease.  PORTABLE CHEST - 1 VIEW  Comparison: 1 day prior  Findings: Endotracheal tube 3.8 cm above carina.  Right-sided Port- A-Cath and PICC lines are unchanged in position.  Normal heart size.  Probable small left pleural effusion, decreased. No pneumothorax.  Minimal improvement in interstitial and airspace disease.  Example improved aeration at left lung base and right upper lobe.  Nasogastric tube terminates beyond the inferior aspect of film.  IMPRESSION:  1.  Minimal improvement in aeration with decreased multifocal infection versus less likely pulmonary edema. 2.  Decreased small left pleural effusion.   Original Report Authenticated By: Consuello Bossier, M.D.    Dg Chest Port 1 View  11/13/2011  *RADIOLOGY REPORT*  Clinical Data: Evaluate endotracheal tube placement.  PORTABLE CHEST - 1 VIEW  Comparison: Earlier today at 0530 hours.  Findings: 1139 hours.  Endotracheal tube terminates 4.7 cm above carina.  Nasogastric tube extends beyond the  inferior aspect of the film.  Right-sided Port-A-Cath unchanged.  Normal heart size.  Similar small left pleural effusion. No pneumothorax.  Interstitial and airspace disease is not significantly changed and most confluent at the left lung base.  IMPRESSION:  1.  Appropriate position of endotracheal tube, 4.8 cm above carina. 2.  Otherwise, similar interstitial and airspace disease.  Favor multifocal pneumonia. 3.  Similar left pleural effusion.   Original Report Authenticated By: Consuello Bossier, M.D.    Dg Chest  Port 1 View  11/13/2011  *RADIOLOGY REPORT*  Clinical Data: Respiratory failure.  PORTABLE CHEST - 1 VIEW  Comparison:  Chest x-ray 11/12/2011.  Findings: A nasogastric tube is seen extending into the stomach, however, the tip of the nasogastric tube extends below the lower margin of the image.  Endotracheal tube has been removed.  Right subclavian single lumen power Port-A-Cath with tip terminating either at the superior cavoatrial junction or within the right atrium (right upper extremity PICC overlaps with this, and the tip of the PICC may be in either location as well).  Compared to the prior examination there is worsening patchy multifocal interstitial and airspace disease throughout all aspects of the lungs bilaterally, concerning for severe multilobar pneumonia.  Moderate left pleural effusion is similar.  Pulmonary vasculature is now completely obscured.  Heart size is normal.  Mediastinal contours are largely obscured by overlying air space disease.  IMPRESSION: 1.  Support apparatus, as above. 2.  Significant worsening of severe multi focal interstitial and airspace disease, as above, concerning for multilobar pneumonia. Some component of underlying pulmonary edema is possible, but is not favored to account for the majority of the findings. 3.  Moderate left pleural effusion is similar.   Original Report Authenticated By: Florencia Reasons, M.D.    Dg Chest Port 1 View  11/12/2011  *RADIOLOGY REPORT*  Clinical Data: Respiratory failure.  PORTABLE CHEST - 1 VIEW  Comparison: 11/11/2011.  Findings: Endotracheal tube, nasogastric tube and right subclavian central line appear unchanged.  Right subclavian Port-A-Cath is also present.  The right subclavian central line may represent a right upper extremity PICC.  Worsening pulmonary aeration is present.  Development of posteriorly layering left pleural effusion with associated atelectasis.  The lower lung volumes than on prior.  Persistent subsegmental  atelectasis at the right base.  IMPRESSION:  1.  Unchanged support apparatus. 2.  A posteriorly layering left pleural effusion and atelectasis is new since yesterday's exam.  Overall worsening pulmonary aeration compared yesterday.   Original Report Authenticated By: Andreas Newport, M.D.    Dg Chest Port 1 View  11/11/2011  *RADIOLOGY REPORT*  Clinical Data: Respiratory failure.  Ventilated patient.  PORTABLE CHEST - 1 VIEW  Comparison: 11/10/2011.  Findings:  Support apparatus appears stable, consisting of nasogastric tube, endotracheal tube, right subclavian power port and right upper extremity PICC.  Bilateral pulmonary nodules are again noted.  Basilar atelectasis.  IMPRESSION: Stable support apparatus. Lower lung volumes with increasing basilar atelectasis.   Original Report Authenticated By: Andreas Newport, M.D.    Dg Chest Port 1 View  11/10/2011  *RADIOLOGY REPORT*  Clinical Data: 51 year old male intubated.  Acute respiratory failure. Metastatic rectal carcinoma.  PORTABLE CHEST - 1 VIEW  Comparison: 1517 hours the same day and earlier.  Findings: Portable semi upright AP view 1958 hours.  Endotracheal tube tip in good position between the level of clavicles and carina.  Stable right chest port and right PICC line/subclavian line.  Enteric tube courses to the abdomen, tip not included.  Stable lung volumes.  Multiple right side lung nodules/masses re- identified. Overall stable ventilation.  No pneumothorax or definite effusion.  Cardiac size and mediastinal contours are within normal limits.  IMPRESSION: 1.  Lines and tubes appear stable and appropriately positioned. 2.  Stable ventilation. 3.  Pulmonary metastatic disease.   Original Report Authenticated By: Harley Hallmark, M.D.    Dg Abd Acute W/chest  11/25/2011  *RADIOLOGY REPORT*  Clinical Data: Abdominal pain, rectal cancer with mets, small bowel resection with colostomy  ACUTE ABDOMEN SERIES (ABDOMEN 2 VIEW & CHEST 1 VIEW)  Comparison:  Chest  radiograph dated 11/22/2011.  CT abdomen pelvis dated 11/20/2011.  Findings: Stable pulmonary metastases in the right mid/lower lung. No pleural effusion or pneumothorax.  Cardiomediastinal silhouette is within normal limits.  Right chest power port.  Postsurgical changes with suture line in the left mid abdomen.  Two pigtail drains in the pelvis.  Left lower quadrant colostomy.  Again seen are dilated loops of small bowel in the right mid abdomen.  No evidence of free air under the diaphragm on the upright view.  IMPRESSION: Stable pulmonary metastases.  No evidence of free air.  Stable dilated loops of small bowel in the right mid abdomen, possibly reflecting adynamic ileus or partial small bowel obstruction, correlate with colostomy output.   Original Report Authenticated By: Charline Bills, M.D.    Dg Abd Acute W/chest  11/07/2011  *RADIOLOGY REPORT*  Clinical Data: Abdominal pain, distention, nausea and vomiting  ACUTE ABDOMEN SERIES (ABDOMEN 2 VIEW & CHEST 1 VIEW)  Comparison: 10/18/2011  Findings: Cardiomediastinal silhouette is unremarkable.  Right subclavian Port-A-Cath with tip in the distal SVC.  Multiple right lung nodules are noted consistent with metastatic disease.  Largest right perihilar region measures 1.9 cm.  Smaller nodules are noted left lower lobe suspicious for metastatic disease.  Marked small bowel distention with air-fluid levels left and mid abdomen highly suspicious for small bowel obstruction.  A drainage catheter is noted in the right lower quadrant. No free abdominal air.  IMPRESSION: Multiple right lung nodules are noted consistent with metastatic disease.  Largest right perihilar region measures 1.9 cm.  Smaller nodules are noted left lower lobe suspicious for metastatic disease.  Marked small bowel distention with air-fluid levels left and mid abdomen highly suspicious for small bowel obstruction.  A drainage catheter is noted in the right lower quadrant. No free  abdominal air.   Original Report Authenticated By: Natasha Mead, M.D.    Dg Abd Portable 1v  11/20/2011  *RADIOLOGY REPORT*  Clinical Data: Abdominal distention  PORTABLE ABDOMEN - 1 VIEW  Comparison:   the previous day's study  Findings: Nasogastric tube has been advanced into the decompressed stomach.  There are a few dilated small bowel loops in the mid abdomen.  The colon is decompressed.  Pigtail drainage catheter projects in the right pelvis.  Ostomy device is noted in the left lower abdomen.  Pulmonary nodules are noted in the visualized lung bases.  IMPRESSION:  1.  Dilated small bowel loops suggesting mid small bowel obstruction.   Original Report Authenticated By: Osa Craver, M.D.     Medications: I have reviewed the patient's current medications.  Assessment/Plan:  1. Clinical stage III (uT3 uN1) rectal cancer status post pelvic radiation, August 24 through November 18, 2009. 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 There was no lymph node involvement (pT3 pN0). He began adjuvant CAPOX chemotherapy March 11, 2010. He completed the sixth and final cycle of CAPOX 06/25/2010. CT of the abdomen and pelvis 03/11/2011 confirmed multiple nodules at the lung bases consistent with metastatic disease. 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 He completed cycle 1 of FOLFIRI/Avastin 04/29/2011. He completed cycle 3 05/27/2011. CEA was improved on 06/10/2011. He completed cycle 4 on 06/10/2011. He completed cycle 5 on 06/24/2011. He completed cycle 6 on 07/08/2011. Restaging  CT evaluation 07/22/2011 showed a slight decrease in pulmonary nodules. He completed cycle 8  FOLFIRI/Avastin on 08/12/2011. He completed cycle 9 on 09/02/2011. Chest CT on 10/07/2011 showed increased size and number of lung nodules. 2. Strangulated peristomal hernia and small bowel obstruction status post reduction of peristomal hernia, omentectomy, exploratory laparotomy, small bowel resection x2 on January 28, 2010.  3. History of rectal bleeding secondary to number 1.  4. History of an elevated CEA. Improved on 06/10/2011 and 07/29/2011. 5. History of a left adrenal nodule.  6. Weight loss. 7. Right knee pain/effusion status post evaluation at [facility].  8. CT abdomen/pelvis 03/11/2011 with multiple nodules at the lung bases consistent with metastatic involvement of the lungs; prominent presacral soft tissue; air bubble within the presacral soft tissue appeared to be extraluminal and breakdown at the anastomotic site could not be excluded. No discrete abscess was seen. 9. Elevated blood pressure. Question effect of Avastin. He was started on Norvasc on 06/10/2011.  10. Frequent bowel movements. Likely related to multiple bowel resections. Imodium was not effective. 11. Suture at the ileostomy reversal scar. Suture removed during the recent hospitalization. 12. Erectile dysfunction. The testosterone level returned at 508 on 08/12/2011, he will be referred to urology 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 Culture returned positive for abundant Escherichia coli. He completed a course of Augmentin.  14. Status post diverting loop descending colostomy 10/05/2011. 15. Status post multiple rectal biopsies 10/05/2011, all negative for malignancy. 16. Hospitalization 10/13/2011 with a bowel obstruction. Resolved. 17. Hospitalization 11/07/2011 with a recurrent small bowel obstruction. He was taken to the OR on 11/10/2011 and underwent an exploratory laparotomy with small bowel resection x2 and incidental appendectomy. He was found to have a  closed loop obstruction. Pathology on #1 small bowel resection showed focal perforation with no evidence of malignancy; pathology on #2 small bowel resection showed metastatic adenocarcinoma consistent with colorectal primary; pathology on the appendix showed serosal fibrous adhesions with no evidence of malignancy.He developed postoperative respiratory failure, sepsis and renal insufficiency. He improved gradually and was able to be extubated. He required placement of multiple percutaneous drains for intraperitoneal abscesses. He was discharged home on 12/02/2011. 18. Deconditioning.  Disposition-Mr. Knipple is recovering from the recent hospitalization. Dr. Truett Perna plans to present his case at the upcoming tumor conference. He is scheduled for CT scans on 12/13/2011 for a followup of the intraperitoneal abscesses. We will add a chest CT for restaging purposes. We will see him in followup on 12/17/2011 to discuss resuming chemotherapy. We began preliminary discussion regarding treatment with irinotecan and oxaliplatin.  Patient seen with Dr. Truett Perna.  Lonna Cobb ANP/GNP-BC

## 2011-12-06 NOTE — Telephone Encounter (Signed)
APPTS MADE AND PRINTED FOR PT,CT CHEST ATTATCHED TO CT A/P ORDERED BY DR BYERLY 10/28,I GAVE PT OUR CONTRAST AS PT STATED HE WAS MAKING A SPECIAL TRIP TO [place] TO GAIN CONTRAST.

## 2011-12-08 ENCOUNTER — Telehealth (INDEPENDENT_AMBULATORY_CARE_PROVIDER_SITE_OTHER): Payer: Self-pay

## 2011-12-08 NOTE — Telephone Encounter (Signed)
Ok to do dressing change as requested by [facility].  Wet to dry daily.

## 2011-12-08 NOTE — Telephone Encounter (Signed)
[facility] nurse called to report the patient has a macerated area on right buttock at previous tube site approx 3cm x .7.  She would like a verbal order to dress this area with wet to dry.  OK?

## 2011-12-09 NOTE — Telephone Encounter (Signed)
Notified Carrie at [facility] ok to perform wet to dry dressing change on pt's buttock area.

## 2011-12-13 ENCOUNTER — Ambulatory Visit (HOSPITAL_COMMUNITY): Admission: RE | Admit: 2011-12-13 | Payer: Medicaid Other | Source: Ambulatory Visit

## 2011-12-13 ENCOUNTER — Ambulatory Visit (HOSPITAL_COMMUNITY)
Admission: RE | Admit: 2011-12-13 | Discharge: 2011-12-13 | Disposition: A | Discharge status: Home / Self Care | Payer: Medicaid Other | Source: Ambulatory Visit | Attending: General Surgery | Admitting: General Surgery

## 2011-12-13 DIAGNOSIS — K449 Diaphragmatic hernia without obstruction or gangrene: Secondary | ICD-10-CM | POA: Insufficient documentation

## 2011-12-13 DIAGNOSIS — C7800 Secondary malignant neoplasm of unspecified lung: Secondary | ICD-10-CM | POA: Insufficient documentation

## 2011-12-13 DIAGNOSIS — IMO0002 Reserved for concepts with insufficient information to code with codable children: Secondary | ICD-10-CM

## 2011-12-13 DIAGNOSIS — C20 Malignant neoplasm of rectum: Secondary | ICD-10-CM

## 2011-12-13 MED ORDER — IOHEXOL 300 MG/ML  SOLN
80.0000 mL | Freq: Once | INTRAMUSCULAR | Status: AC | PRN
  Administered 2011-12-13: 80 mL via INTRAVENOUS

## 2011-12-17 ENCOUNTER — Encounter: Payer: Self-pay | Admitting: *Deleted

## 2011-12-17 ENCOUNTER — Ambulatory Visit (HOSPITAL_BASED_OUTPATIENT_CLINIC_OR_DEPARTMENT_OTHER): Payer: Medicaid Other | Admitting: Nurse Practitioner

## 2011-12-17 ENCOUNTER — Telehealth: Payer: Self-pay | Admitting: Oncology

## 2011-12-17 VITALS — BP 139/106 | HR 123 | Temp 98.6°F | Resp 20 | Ht 74.0 in | Wt 112.9 lb

## 2011-12-17 DIAGNOSIS — M549 Dorsalgia, unspecified: Secondary | ICD-10-CM

## 2011-12-17 DIAGNOSIS — N509 Disorder of male genital organs, unspecified: Secondary | ICD-10-CM

## 2011-12-17 DIAGNOSIS — I10 Essential (primary) hypertension: Secondary | ICD-10-CM

## 2011-12-17 DIAGNOSIS — C20 Malignant neoplasm of rectum: Secondary | ICD-10-CM

## 2011-12-17 DIAGNOSIS — C7800 Secondary malignant neoplasm of unspecified lung: Secondary | ICD-10-CM

## 2011-12-17 MED ORDER — OXYCODONE-ACETAMINOPHEN 10-325 MG PO TABS: 1.0000 | ORAL_TABLET | Freq: Four times a day (QID) | ORAL | Status: DC | PRN

## 2011-12-17 MED ORDER — AMLODIPINE BESYLATE 10 MG PO TABS: 10.0000 mg | ORAL_TABLET | Freq: Every day | ORAL | Status: DC

## 2011-12-17 NOTE — Telephone Encounter (Signed)
appts made and printed for pt aom °

## 2011-12-17 NOTE — Progress Notes (Signed)
OFFICE PROGRESS NOTE  Interval history:  [name] returns as scheduled. Restaging CT scans of the chest/abdomen/pelvis on 12/13/2011 showed mild progression of pulmonary metastatic disease; interval resolution of left upper quadrant and central pelvic fluid collections; residual gas and fluid collection in the undersurface of the left lower quadrant ventral abdominal wall with a drainage catheter in place; similar appearance of soft tissue thickening and enhancement within the perirectal region; abnormal small bowel dilatation and moderate colonic stool burden consistent with ileus and/or constipation.  [name] reports improvement in his appetite. Colostomy is functioning normally. He describes a stool as "pasty". No shortness of breath or cough. No nausea or vomiting. He denies fever. No urinary problems.  Over the past 1-1/2 weeks he has noted left-sided back and testicular pain when lying flat. He is taking frequent pain medication.   Objective: Blood pressure 139/106, pulse 123, temperature 98.6 F (37 C), temperature source Oral, resp. rate 20, height 6\' 2"  (1.88 m), weight 112 lb 14.4 oz (51.211 kg).  Oropharynx is without thrush or ulceration. Lungs are clear. Regular cardiac rhythm. Port-A-Cath site is without erythema. Abdomen is soft and nontender. No hepatomegaly. He has a colostomy. Midline abdominal wound continues to heal. He has 3 drainage sites, all covered with dressings. The gauze was not removed. Extremities are without edema. Testicular exam unremarkable. Left paraspinous tenderness at the lower thoracic/upper lumbar region.  Lab Results: Lab Results  Component Value Date   WBC 11.4* 12/02/2011   HGB 7.8* 12/02/2011   HCT 24.9* 12/02/2011   MCV 86.5 12/02/2011   PLT 540* 12/02/2011    Chemistry:    Chemistry      Component Value Date/Time   NA 136 12/02/2011 0600   K 4.1 12/02/2011 0600   CL 100 12/02/2011 0600   CO2 29 12/02/2011 0600   BUN 9 12/02/2011 0600     CREATININE 0.64 12/02/2011 0600      Component Value Date/Time   CALCIUM 8.3* 12/02/2011 0600   ALKPHOS 58 11/29/2011 0625   AST 13 11/29/2011 0625   ALT 13 11/29/2011 0625   BILITOT 0.3 11/29/2011 0625       Studies/Results: Ct Abdomen Pelvis Wo Contrast  11/20/2011  *RADIOLOGY REPORT*  Clinical Data: Small bowel obstruction, intra abdominal fluid collections  CT ABDOMEN AND PELVIS WITHOUT CONTRAST  Technique:  Multidetector CT imaging of the abdomen and pelvis was performed following the standard protocol without intravenous contrast.  Comparison:   the previous day's study  Findings: Multiple pulmonary nodules in the visualized lung bases. Nasogastric tube extends into decompressed stomach.  Persistent left upper quadrant peritoneal fluid collection in measuring at least 10 x 15.5 cm, displacing the spleen medially.  Persistent large collection just cephalad to the urinary bladder measuring up to 20 cm   diameter.  Stable right transgluteal drainage catheter which was probably withdrawn partially since its original placement.  Unremarkable uninfused evaluation of liver, spleen, adrenal glands, pancreas.  There is persistent residual contrast in the decompressed renal collecting systems.  Patchy areas of persistent nephrogram in the left kidney which can be seen in the setting of pyelonephritis.  Atheromatous abdominal aorta.  There are dilated loops of bowel in the mid abdomen measuring up to 4.7 cm diameter at the level of anastomotic staple line.  There is residual oral contrast material in the decompressed colon.  There is a left lower quadrant diverting colostomy.  IMPRESSION:  1.  Some interval increase in degree of distention of mid abdominal  small bowel loops.  Limited evaluation secondary to lack of oral contrast.  Unenhanced CT was performed per clinician order.  Lack of IV contrast limits sensitivity and specificity, especially for evaluation of abdominal/pelvic solid viscera. 2.   Little change in large peritoneal fluid collections previously described.   Original Report Authenticated By: Osa Craver, M.D.    Ct Chest W Contrast  12/13/2011  *RADIOLOGY REPORT*  Clinical Data:  Follow up fluid collections.  History of metastatic rectal cancer.  CT CHEST, ABDOMEN AND PELVIS WITH CONTRAST  Technique:  Multidetector CT imaging of the chest, abdomen and pelvis was performed following the standard protocol during bolus administration of intravenous contrast.  Contrast: 80mL OMNIPAQUE IOHEXOL 300 MG/ML  SOLN  Comparison:  11/28/2011  CT CHEST  Findings:  Right chest wall porta-catheter is identified with the tip in the distal SVC.  There is no mediastinal or hilar adenopathy.  No pericardial effusion.  No pleural effusion identified.  Multiple pulmonary parenchymal nodules are identified throughout both lungs consistent with metastatic disease.  Index right upper lobe nodule measures 2.2 cm, image 30.  Previously 1.9 cm.  Right lower lobe pulmonary nodule measures 1.9 cm, image 34.  Previously 1.2 cm.  Left lower lobe pulmonary nodule measures 9.6 mm, image 33.  Previously 8.9 mm.  Review of the visualized osseous structures shows no worrisome lytic or sclerotic bone lesions.  IMPRESSION:  1.  No acute findings. 2.  Mild progression of pulmonary metastatic disease.  CT ABDOMEN AND PELVIS  Findings:  There is no suspicious liver abnormality.  Gallbladder is normal. No biliary dilatation.  The pancreas is unremarkable.  Normal appearance of the spleen.  The adrenal glands appear normal.  Cyst within the right kidney is identified measuring 1.4 cm.  The left kidney is normal.  Normal appearance of the urinary bladder.  No enlarged upper abdominal lymph nodes.  No pelvic or inguinal adenopathy.  Small hiatal hernia.  The stomach is otherwise unremarkable.  The patient has a left lower quadrant colostomy.  Dilated loops of small bowel within the right upper quadrant of the abdomen measure  up to 6 cm.  There is a moderate to marked stool burden identified within the colon.   Left upper quadrant pigtail drainage catheter is identified with resolution of previously noted left upper quadrant fluid collection.  Fluid collection along the undersurface of the left lower quadrant ventral abdominal wall is improved from previous exam.  There is residual gas and fluid collection in this area which measures 1.5 x 5.6 x 7.8 cm.  Within the central portion of the pelvis there is a percutaneous pigtail drainage catheter with resolution of previously noted fluid collection in this area. The previously noted right perirectal fluid collection has resolved in the interval.  Similar appearance of soft tissue thickening and enhancement within the perirectal region.  On today's study this measures 4 x 4.7 cm, image 107.  Previously this measured 3.7 x 4.8 cm.  No adenopathy identified within the abdomen or the pelvis.  Review of the visualized osseous structures shows no aggressive lytic or sclerotic bone lesions.  IMPRESSION:  1.  Interval resolution of left upper quadrant and central pelvic fluid collections. 2.  Residual gas and fluid collection is identified in the undersurface of the left lower quadrant ventral abdominal wall. Drainage catheter remains in place. 3.  Similar appearance of the soft tissue thickening and enhancement within the perirectal region, nonspecific. 4.  Abnormal small bowel dilatation and  moderate colonic stool burden consistent with ileus and/or constipation.   Original Report Authenticated By: Rosealee Albee, M.D.    Ct Guided Abscess Drain  11/29/2011  *RADIOLOGY REPORT*  Indication: Residual fluid collections post multiple prior percutaneous drain placements.  CT GUIDED RIGHT LOWER ABDOMINAL QUADRANT DRAINAGE CATHETER PLACEMENT CT GUIDED LEFT UPPER ABDOMINAL QUADRANT DRAINAGE CATHETER PLACEMENT  Comparison: CT abdomen pelvis - 11/29/2011  Medications: Fentanyl 200 mcg IV; Versed 3 mg  IV  Total Moderate Sedation time: 43 minutes  Contrast: None  Complications: None immediate  Technique / Findings:  Informed written consent was obtained from the patient after a discussion of the risks, benefits and alternatives to treatment. The patient was placed supine on the CT gantry and a pre procedural CT was performed re-demonstrating the known abscess/fluid collections within the both the right lower and left upper abdominal quadrants.  The procedure was planned.   A timeout was performed prior to the initiation of the procedure.  The skin overlying the right lower abdominal quadrant was prepped and draped in the usual sterile fashion.   The overlying soft tissues were anesthetized with 1% lidocaine with epinephrine. Appropriate trajectory was planned with the use of a 22 gauge spinal needle.  An 18 gauge trocar needle was advanced into the abscess/fluid collection and a short Amplatz super stiff wire was coiled within the collection.   Appropriate positioning was confirmed with a limited CT scan.  The tract was serially dilated allowing placement of a 10 [place] all-purpose drainage catheter. Appropriate positioning was confirmed with a limited postprocedural CT scan.  25 ml of purulent fluid was aspirated.  The tube was connected to a drainage bag and sutured in place.  A dressing was placed.  Attention was now paid towards the residual fluid collection in the left upper abdominal quadrant.  The skin overlying the left upper abdomen was prepped and draped in the usual sterile fashion.   The overlying soft tissues were anesthetized with 1% lidocaine with epinephrine.  Appropriate trajectory was planned with the use of a 22 gauge spinal needle.  An 18 gauge trocar needle was advanced into the abscess/fluid collection and a short Amplatz super stiff wire was coiled within the collection.   Appropriate positioning was confirmed with a limited CT scan.  The tract was serially dilated allowing placement of a 10  [place] all-purpose drainage catheter.  Appropriate positioning was confirmed with a limited postprocedural CT scan.  75 ml of purulent fluid was aspirated.  The tube was connected to a drainage bag and sutured in place.  A dressing was placed.  The patient tolerated the above procedures well without immediate post procedural complication.  Impression:  Successful CT guided placement of a 10 French all purpose drain catheters into the residual abscesses/fluid collections within the right lower and the left upper abdominal quadrants. Samples were sent to the laboratory from both drainage catheters as requested by the ordering clinical team.   Original Report Authenticated By: Waynard Reeds, M.D.    Ct Guided Abscess Drain  11/29/2011  *RADIOLOGY REPORT*  Indication: Residual fluid collections post multiple prior percutaneous drain placements.  CT GUIDED RIGHT LOWER ABDOMINAL QUADRANT DRAINAGE CATHETER PLACEMENT CT GUIDED LEFT UPPER ABDOMINAL QUADRANT DRAINAGE CATHETER PLACEMENT  Comparison: CT abdomen pelvis - 11/29/2011  Medications: Fentanyl 200 mcg IV; Versed 3 mg IV  Total Moderate Sedation time: 43 minutes  Contrast: None  Complications: None immediate  Technique / Findings:  Informed written consent was obtained from  the patient after a discussion of the risks, benefits and alternatives to treatment. The patient was placed supine on the CT gantry and a pre procedural CT was performed re-demonstrating the known abscess/fluid collections within the both the right lower and left upper abdominal quadrants.  The procedure was planned.   A timeout was performed prior to the initiation of the procedure.  The skin overlying the right lower abdominal quadrant was prepped and draped in the usual sterile fashion.   The overlying soft tissues were anesthetized with 1% lidocaine with epinephrine. Appropriate trajectory was planned with the use of a 22 gauge spinal needle.  An 18 gauge trocar needle was advanced into  the abscess/fluid collection and a short Amplatz super stiff wire was coiled within the collection.   Appropriate positioning was confirmed with a limited CT scan.  The tract was serially dilated allowing placement of a 10 [place] all-purpose drainage catheter. Appropriate positioning was confirmed with a limited postprocedural CT scan.  25 ml of purulent fluid was aspirated.  The tube was connected to a drainage bag and sutured in place.  A dressing was placed.  Attention was now paid towards the residual fluid collection in the left upper abdominal quadrant.  The skin overlying the left upper abdomen was prepped and draped in the usual sterile fashion.   The overlying soft tissues were anesthetized with 1% lidocaine with epinephrine.  Appropriate trajectory was planned with the use of a 22 gauge spinal needle.  An 18 gauge trocar needle was advanced into the abscess/fluid collection and a short Amplatz super stiff wire was coiled within the collection.   Appropriate positioning was confirmed with a limited CT scan.  The tract was serially dilated allowing placement of a 10 [place] all-purpose drainage catheter.  Appropriate positioning was confirmed with a limited postprocedural CT scan.  75 ml of purulent fluid was aspirated.  The tube was connected to a drainage bag and sutured in place.  A dressing was placed.  The patient tolerated the above procedures well without immediate post procedural complication.  Impression:  Successful CT guided placement of a 10 French all purpose drain catheters into the residual abscesses/fluid collections within the right lower and the left upper abdominal quadrants. Samples were sent to the laboratory from both drainage catheters as requested by the ordering clinical team.   Original Report Authenticated By: Waynard Reeds, M.D.    Ct Guided Abscess Drain  11/21/2011  *RADIOLOGY REPORT*  Clinical Data: Status post bowel resection surgery and history of metastatic rectal  carcinoma.  The patient has developed peritoneal fluid collections now requiring drainage.  CT GUIDED DRAINAGE OF PERITONEAL ABSCESS  Sedation:  4.0 mg IV Versed;  200 mcg IV Fentanyl  Total Moderate Sedation Time: 15 minutes.  Procedure:  The procedure, risks, benefits, and alternatives were explained to the patient.  Questions regarding the procedure were encouraged and answered. The patient understands and consents to the procedure.  The left lower quadrant abdominal wall was prepped with Betadine in a sterile fashion, and a sterile drape was applied covering the operative field.  A sterile gown and sterile gloves were used for the procedure. Local anesthesia was provided with 1% Lidocaine.  An 18 gauge needle was advanced into fluid within the left lower peritoneal cavity. Fluid was aspirated and a sample sent for culture analysis.  A guidewire was advanced.  The tract was dilated and a 12-French pigtail drainage catheter placed.  This catheter was connected to a  suction bulb.  The catheter was secured at the skin with a Prolene retention suture.  Complications: None  Findings: Aspirated fluid was thin and amber in color.  A drain was placed in the left lower quadrant.  This does appear to ultimately communicate with another dominant pocket of fluid in the upper quadrant.  IMPRESSION: CT guided drainage of the left lower quadrant peritoneal fluid collection fluid appears non purulent and thin in nature.  A sample was sent for culture analysis. A 12 French drainage catheter was placed and connected to a suction bulb.   Original Report Authenticated By: Reola Calkins, M.D.    Ct Abdomen Pelvis W Contrast  12/13/2011  *RADIOLOGY REPORT*  Clinical Data:  Follow up fluid collections.  History of metastatic rectal cancer.  CT CHEST, ABDOMEN AND PELVIS WITH CONTRAST  Technique:  Multidetector CT imaging of the chest, abdomen and pelvis was performed following the standard protocol during bolus administration of  intravenous contrast.  Contrast: 80mL OMNIPAQUE IOHEXOL 300 MG/ML  SOLN  Comparison:  11/28/2011  CT CHEST  Findings:  Right chest wall porta-catheter is identified with the tip in the distal SVC.  There is no mediastinal or hilar adenopathy.  No pericardial effusion.  No pleural effusion identified.  Multiple pulmonary parenchymal nodules are identified throughout both lungs consistent with metastatic disease.  Index right upper lobe nodule measures 2.2 cm, image 30.  Previously 1.9 cm.  Right lower lobe pulmonary nodule measures 1.9 cm, image 34.  Previously 1.2 cm.  Left lower lobe pulmonary nodule measures 9.6 mm, image 33.  Previously 8.9 mm.  Review of the visualized osseous structures shows no worrisome lytic or sclerotic bone lesions.  IMPRESSION:  1.  No acute findings. 2.  Mild progression of pulmonary metastatic disease.  CT ABDOMEN AND PELVIS  Findings:  There is no suspicious liver abnormality.  Gallbladder is normal. No biliary dilatation.  The pancreas is unremarkable.  Normal appearance of the spleen.  The adrenal glands appear normal.  Cyst within the right kidney is identified measuring 1.4 cm.  The left kidney is normal.  Normal appearance of the urinary bladder.  No enlarged upper abdominal lymph nodes.  No pelvic or inguinal adenopathy.  Small hiatal hernia.  The stomach is otherwise unremarkable.  The patient has a left lower quadrant colostomy.  Dilated loops of small bowel within the right upper quadrant of the abdomen measure up to 6 cm.  There is a moderate to marked stool burden identified within the colon.   Left upper quadrant pigtail drainage catheter is identified with resolution of previously noted left upper quadrant fluid collection.  Fluid collection along the undersurface of the left lower quadrant ventral abdominal wall is improved from previous exam.  There is residual gas and fluid collection in this area which measures 1.5 x 5.6 x 7.8 cm.  Within the central portion of the  pelvis there is a percutaneous pigtail drainage catheter with resolution of previously noted fluid collection in this area. The previously noted right perirectal fluid collection has resolved in the interval.  Similar appearance of soft tissue thickening and enhancement within the perirectal region.  On today's study this measures 4 x 4.7 cm, image 107.  Previously this measured 3.7 x 4.8 cm.  No adenopathy identified within the abdomen or the pelvis.  Review of the visualized osseous structures shows no aggressive lytic or sclerotic bone lesions.  IMPRESSION:  1.  Interval resolution of left upper quadrant and central pelvic  fluid collections. 2.  Residual gas and fluid collection is identified in the undersurface of the left lower quadrant ventral abdominal wall. Drainage catheter remains in place. 3.  Similar appearance of the soft tissue thickening and enhancement within the perirectal region, nonspecific. 4.  Abnormal small bowel dilatation and moderate colonic stool burden consistent with ileus and/or constipation.   Original Report Authenticated By: Rosealee Albee, M.D.    Ct Abdomen Pelvis W Contrast  11/28/2011  *RADIOLOGY REPORT*  Clinical Data: Rectal adenocarcinoma.  Recent bowel surgery. Follow-up abdominal and pelvic abscesses.  CT ABDOMEN AND PELVIS WITH CONTRAST  Technique:  Multidetector CT imaging of the abdomen and pelvis was performed following the standard protocol during bolus administration of intravenous contrast.  Contrast: OMNIPAQUE IOHEXOL 300 MG/ML  SOLN  Comparison: 11/20/2011  Findings: A new left lower quadrant percutaneous drainage catheter is seen just deep to the rectus abdominis muscles. There is been near complete resolution of the anterior pelvic fluid collection seen in this region. There has also been interval decrease in the size of a communicating fluid collection in the anterior left abdomen extending into the left subphrenic space.  A persistent, separately  loculated fluid and gas collection is seen in the right lower quadrant in the upper pelvis which measures 4.8 x 9.8 cm.  Adjacent anastomotic staples are noted in the right lower quadrant.  A right transgluteal drainage catheter has been pulled back into the gluteus maximus muscle, and there has been reappearance of a small fluid and tail gas collection in the right perirectal region measuring 2.0 x 3.1 cm.  Diffuse dilatation of small bowel and colonic distention is again demonstrated, consistent with ileus.  No evidence of bowel obstruction.  Left lower quadrant colostomy again noted with orally administered contrast reaching the colostomy site.  Mild right hydronephrosis is seen which is increased since previous study and is likely related to the right lower quadrant fluid collection and associated inflammatory changes. A small right renal cyst is again noted.  The abdominal parenchymal organs are otherwise unremarkable appearance.  Gallbladder appears normal.  Images of the lung bases again show diffuse bilateral pulmonary metastases.  Small left pleural effusion and pericardial effusion are mildly increased in size.  IMPRESSION:  1.  Near complete resolution of left lower quadrant fluid collection, and decreased size of communicating left anterior abdominal and left subphrenic collections following drainage catheter placement. 2.  Persistent 5 x 10 cm fluid and gas collection in the right lower quadrant and upper pelvis, consistent with a separately loculated fluid collection or abscess. 3.  Right transgluteal drainage catheter has been pulled back into the gluteus muscle, with reappearance of 2 x 3 cm fluid and gas collection in the right perirectal region. 4.  Increased mild right hydronephrosis. 5.  Persistent adynamic ileus. 6.  Increased small left pleural effusion and pericardial effusion. 7.  Stable bibasilar pulmonary metastases.   Original Report Authenticated By: Danae Orleans, M.D.    Ct Abdomen  Pelvis W Contrast  11/19/2011  *RADIOLOGY REPORT*  Clinical Data: Postoperative fever and leukocytosis. 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  CT ABDOMEN AND PELVIS WITH CONTRAST  Technique:  Multidetector CT imaging of the abdomen and pelvis was performed following the standard protocol during bolus administration of intravenous contrast.  Contrast: OMNIPAQUE IOHEXOL 300 MG/ML  SOLN  Comparison: 10/12/2011.  Findings: Increased size and number of multiple masses in both lungs.  The largest is in the right lower lobe, measuring  1.8 cm in maximum diameter on image #1.  The previously demonstrated transgluteal drainage catheter in the presacral space is more laterally positioned on the right at this time.  There is some fluid in the prerectal space, measuring 6.7 x 2.9 cm on image number 81.  Interval development of a large collection of fluid in the left upper abdomen, beneath the left hemidiaphragm, displacing the spleen and stomach to the right.  On image number 18, this measures 15.7 x 10.7 cm in maximum dimensions.  On coronal image number 57, this measures 10.7 cm in length. This has a thin surrounding enhancing rim.  Interval development of a large collection of fluid in the pelvis and lower abdomen, measuring 22.4 x 13.9 cm in maximum dimensions on coronal image number 30 and 9.8 cm in AP diameter on axial image number 73.  On coronal image number 34, this measures 21.9 x 21.1 cm in maximum dimensions.  This also has a thin surrounding enhancing rim.  Nasogastric tube tip in the distal stomach.  No dilated bowel loops are seen at this time.  The liver, pancreas, adrenal glands, kidneys and urinary bladder are unremarkable.  A Foley catheter is in the bladder.  Normal sized prostate gland.  The gallbladder is poorly distended with diffuse wall enhancement and small amount of pericholecystic fluid.  There is also a small amount of free peritoneal fluid in addition to the fluid collections  described above.  Mild left lower lobe atelectasis.  Unremarkable bones.  IMPRESSION:  1.  Two very large abscesses in the abdomen and pelvis, as described above. 2.  Interval displacement of the presacral drainage catheter with redevelopment of a small amount of fluid/abscess in the presacral space. 3.  Progressive pulmonary metastatic disease. 4.  Mild left lower lobe atelectasis. 5.  Small amount of free peritoneal fluid, including pericholecystic fluid.  These results will be called to the ordering clinician or representative by the Radiologist Assistant, and communication documented in the PACS Dashboard.   Original Report Authenticated By: Darrol Angel, M.D.    Dg Chest Port 1 View  11/22/2011  *RADIOLOGY REPORT*  Clinical Data: Intubation, rectal carcinoma with pulmonary metastases, hypertension  PORTABLE CHEST - 1 VIEW  Comparison: Portable exam 0709 hours compared to 11/22/2011  Findings: Tip of endotracheal tube 3.3 cm above carina. Nasogastric tube into stomach. Right subclavian power port, tip projecting over SVC. Normal heart size, mediastinal contours, and pulmonary vascularity. Minimal bibasilar atelectasis. Lungs otherwise clear. 2.1 x 2.1 cm right perihilar nodule/metastasis again noted. Additional subtle nodular foci in right lung. No definite pleural effusion or pneumothorax. No acute osseous findings.  IMPRESSION: Minimal bibasilar atelectasis. Pulmonary metastases. Satisfactory line and tube positions.   Original Report Authenticated By: Lollie Marrow, M.D.    Dg Chest Port 1 View  11/22/2011  *RADIOLOGY REPORT*  Clinical Data: 51 year old male endotracheal tube placement. Metastatic adenocarcinoma.  Respiratory failure.  PORTABLE CHEST - 1 VIEW  Comparison: 11/21/2011 and earlier.  Findings: Portable semi upright AP view 0445 hours.  Endotracheal tube has been advanced.  Tip at the right mainstem bronchus. Enteric tube courses to the abdomen, tip not included.  Stable right chest  Port-A-Cath and right PICC line.  Increased opacity at the left lung base.  Stable lung volumes overall.  Cardiac size and mediastinal contours are within normal limits.  No pneumothorax.  No large pleural effusion.  Bilateral pulmonary masses.  IMPRESSION: 1.  Right mainstem bronchus intubation.  Retract the endotracheal tube  4-5 cm for optimal placement. 2.  Increased left lung base atelectasis. 3.  Pulmonary metastatic disease.  Study discussed by telephone with RN Verl Blalock on 11/22/2011 at 0545 hours.   Original Report Authenticated By: Harley Hallmark, M.D.    Dg Chest Port 1 View  11/21/2011  *RADIOLOGY REPORT*  Clinical Data: Endotracheal tube placement.  PORTABLE CHEST - 1 VIEW  Comparison: 11/20/2011  Findings: Endotracheal tube is in place with tip approximate 3.6 cm above carina.  Nasogastric tube is in place with tip off the film but beyond the gastroesophageal junction.  Right-sided power port tip overlies the level of the upper right atrium.  The  Heart size is normal.  There is mild interstitial edema versus pulmonary infiltrates.  IMPRESSION:  1.  Endotracheal tube as described. 2.  Pulmonary infiltrates versus edema.   Original Report Authenticated By: Patterson Hammersmith, M.D.    Dg Chest Port 1 View  11/20/2011  *RADIOLOGY REPORT*  Clinical Data: Check ET tube position.  Hypertension.  No new lung mets from rectal carcinoma.  PORTABLE CHEST - 1 VIEW  Comparison: 11/19/2011  Findings: Endotracheal tube tip lies 3.3 cm above the carina, without significant change, well positioned.  Nasogastric tube passes below the diaphragm into the stomach. Right anterior chest wall power Port-A-Cath has its tips at the caval atrial junction.  Cardiac silhouette is normal in size and configuration.  No mediastinal or hilar mass or adenopathy.  At least three discrete right lung nodules are noted, stable.  Mild atelectasis at the medial left lung base.  Lungs are otherwise clear.  IMPRESSION: Lines and  tubes are well positioned and stable.  No acute cardiopulmonary disease.   Original Report Authenticated By: Domenic Moras, M.D.    Dg Chest Port 1 View  11/19/2011  *RADIOLOGY REPORT*  Clinical Data: ETT placement, mets  PORTABLE CHEST - 1 VIEW  Comparison: 10.  1013  Findings: Endotracheal tube terminates 3 cm above the carina.  Low lung volumes with increased interstitial markings.  Right lower lobe nodule.  Additional smaller bilateral nodular opacities/metastases, better visualized on CT. The effusion  Cardiomediastinal silhouette is within normal limits.  Stable right chest power port and right arm PICC.  IMPRESSION: Endotracheal tube terminates 3 cm above the carina.  Otherwise unchanged.   Original Report Authenticated By: Charline Bills, M.D.    Dg Chest Port 1 View  11/18/2011  *RADIOLOGY REPORT*  Clinical Data: Evaluate endotracheal tube positioning  PORTABLE CHEST - 1 VIEW  Comparison: 11/17/2011; 11/16/2011; 11/15/2011  Findings: Grossly unchanged cardiac silhouette and mediastinal contours.  Stable positioning of support apparatus.  Lung volumes remain persistently reduced.  Known scattered bilateral pulmonary nodules/masses are grossly unchanged.  Grossly unchanged bibasilar heterogeneous opacities, likely atelectasis.  No new focal airspace opacities.  No definite pleural effusion or pneumothorax. Unchanged bones.  IMPRESSION: 1.  Stable positioning of support apparatus.  No pneumothorax. 2.  Grossly unchanged bilateral pulmonary metastasis.   Original Report Authenticated By: Waynard Reeds, M.D.    Dg Abd Acute W/chest  11/25/2011  *RADIOLOGY REPORT*  Clinical Data: Abdominal pain, rectal cancer with mets, small bowel resection with colostomy  ACUTE ABDOMEN SERIES (ABDOMEN 2 VIEW & CHEST 1 VIEW)  Comparison: Chest radiograph dated 11/22/2011.  CT abdomen pelvis dated 11/20/2011.  Findings: Stable pulmonary metastases in the right mid/lower lung. No pleural effusion or pneumothorax.   Cardiomediastinal silhouette is within normal limits.  Right chest power port.  Postsurgical changes with suture  line in the left mid abdomen.  Two pigtail drains in the pelvis.  Left lower quadrant colostomy.  Again seen are dilated loops of small bowel in the right mid abdomen.  No evidence of free air under the diaphragm on the upright view.  IMPRESSION: Stable pulmonary metastases.  No evidence of free air.  Stable dilated loops of small bowel in the right mid abdomen, possibly reflecting adynamic ileus or partial small bowel obstruction, correlate with colostomy output.   Original Report Authenticated By: Charline Bills, M.D.    Dg Abd Portable 1v  11/20/2011  *RADIOLOGY REPORT*  Clinical Data: Abdominal distention  PORTABLE ABDOMEN - 1 VIEW  Comparison:   the previous day's study  Findings: Nasogastric tube has been advanced into the decompressed stomach.  There are a few dilated small bowel loops in the mid abdomen.  The colon is decompressed.  Pigtail drainage catheter projects in the right pelvis.  Ostomy device is noted in the left lower abdomen.  Pulmonary nodules are noted in the visualized lung bases.  IMPRESSION:  1.  Dilated small bowel loops suggesting mid small bowel obstruction.   Original Report Authenticated By: Osa Craver, M.D.     Medications: I have reviewed the patient's current medications.  Assessment/Plan:  1. Clinical stage III (uT3 uN1) rectal cancer status post pelvic radiation, August 24 through November 18, 2009. 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 There was no lymph node involvement (pT3 pN0). He began adjuvant CAPOX chemotherapy March 11, 2010. He completed the sixth and final cycle of CAPOX  06/25/2010. CT of the abdomen and pelvis 03/11/2011 confirmed multiple nodules at the lung bases consistent with metastatic disease. 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 He completed cycle 1 of FOLFIRI/Avastin 04/29/2011. He completed cycle 3 05/27/2011. CEA was improved on 06/10/2011. He completed cycle 4 on 06/10/2011. He completed cycle 5 on 06/24/2011. He completed cycle 6 on 07/08/2011. Restaging CT evaluation 07/22/2011 showed a slight decrease in pulmonary nodules. He completed cycle 8 FOLFIRI/Avastin on 08/12/2011. He completed cycle 9 on 09/02/2011. Chest CT on 10/07/2011 showed increased size and number of lung nodules. 2. Strangulated peristomal hernia and small bowel obstruction status post reduction of peristomal hernia, omentectomy, exploratory laparotomy, small bowel resection x2 on January 28, 2010.  3. History of rectal bleeding secondary to number 1.  4. History of an elevated CEA. Improved on 06/10/2011 and 07/29/2011. 5. History of a left adrenal nodule.  6. Weight loss. 7. Right knee pain/effusion status post evaluation at [facility].  8. CT abdomen/pelvis 03/11/2011 with multiple nodules at the lung bases consistent with metastatic involvement of the lungs; prominent presacral soft tissue; air bubble within the presacral soft tissue appeared to be extraluminal and breakdown at the anastomotic site could not be excluded. No discrete abscess was seen. 9. Elevated blood pressure. Question effect of Avastin. He was started on Norvasc on 06/10/2011.  10. Frequent bowel movements. Likely related to multiple bowel resections. Imodium was not effective. 11. Suture at the ileostomy reversal scar. Suture removed during the recent hospitalization. 12. Erectile dysfunction. The testosterone level returned at 508 on 08/12/2011, he will be referred to urology 13. 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 Culture returned positive for abundant  Escherichia coli. He completed a course of Augmentin.  14. Status post diverting loop descending colostomy 10/05/2011. 15. Status post multiple rectal biopsies 10/05/2011, all negative for malignancy. 16. Hospitalization 10/13/2011 with a bowel obstruction. Resolved. 17. Hospitalization 11/07/2011 with a recurrent small bowel obstruction. He was taken to the OR on 11/10/2011 and underwent an exploratory laparotomy with small bowel resection x2 and incidental appendectomy. He was found to have a closed loop obstruction. Pathology on #1 small bowel resection showed focal perforation with no evidence of malignancy; pathology on #2 small bowel resection showed metastatic adenocarcinoma consistent with colorectal primary; pathology on the appendix showed serosal fibrous adhesions with no evidence of malignancy.He developed postoperative respiratory failure, sepsis and renal insufficiency. He improved gradually and was able to be extubated. He required placement of multiple percutaneous drains for intraperitoneal abscesses. He was discharged home on 12/02/2011. 18. Deconditioning. 19. Left paraspinous pain/tenderness at the lower thoracic/upper lumbar region and intermittent testicular pain. Question etiology.  Disposition-Mr. Bruun continues to recover from the recent hospitalization. He has a followup appointment with Dr. [name] on 12/20/2011 regarding the drains.  The restaging CT evaluation shows mild progression of pulmonary metastatic disease. Dr. Truett Perna recommends resuming systemic therapy, possibly with irinotecan/oxaliplatin.  The etiology of the back and testicular pain is unclear. Dr. Truett Perna will review the recent CT scan with a radiologist.  Mr. Mcintire will return for a followup visit on 12/22/2011 with plans to resume systemic therapy in the near future if he is otherwise stable. He was given instructions to contact the office  with fever, chills, worsening pain.  Patient seen with Dr. Truett Perna.  Lonna Cobb ANP/GNP-BC

## 2011-12-20 ENCOUNTER — Encounter (INDEPENDENT_AMBULATORY_CARE_PROVIDER_SITE_OTHER): Payer: Self-pay | Admitting: General Surgery

## 2011-12-20 ENCOUNTER — Ambulatory Visit (INDEPENDENT_AMBULATORY_CARE_PROVIDER_SITE_OTHER): Payer: Medicaid Other | Admitting: General Surgery

## 2011-12-20 VITALS — BP 132/80 | HR 60 | Temp 97.0°F | Resp 18 | Ht 64.0 in | Wt 113.4 lb

## 2011-12-20 DIAGNOSIS — C20 Malignant neoplasm of rectum: Secondary | ICD-10-CM

## 2011-12-20 DIAGNOSIS — K651 Peritoneal abscess: Secondary | ICD-10-CM

## 2011-12-20 MED ORDER — POLYETHYLENE GLYCOL 3350 17 GM/SCOOP PO POWD: 17.0000 g | Freq: Every day | ORAL | Status: DC

## 2011-12-20 MED ORDER — OXYCODONE-ACETAMINOPHEN 10-325 MG PO TABS: 1.0000 | ORAL_TABLET | ORAL | Status: DC | PRN

## 2011-12-20 MED ORDER — DOCUSATE SODIUM 100 MG PO CAPS: 100.0000 mg | ORAL_CAPSULE | Freq: Two times a day (BID) | ORAL | Status: DC

## 2011-12-20 NOTE — Patient Instructions (Signed)
Get drain study and follow up with me in 2 weeks.

## 2011-12-20 NOTE — Assessment & Plan Note (Signed)
Pt to get drain study in 1-2 weeks.  Follow up with me in 2 weeks  Add stool softener and miralax

## 2011-12-20 NOTE — Progress Notes (Signed)
HISTORY: Patient is doing much better since discharge. His appetite has been improving. He continues to have firm stools. He had the drain removed from his gluteal region in radiology after last CT scan.  He has been flushing to 3 abdominal drains appropriately.  He denies fevers and chills. He is still taking narcotics. He has trouble sleeping with the left upper quadrant drain.  His drains are still putting out >20 ml/day.   EXAM: General:  Alert and oriented Incision:  Well healed.  Ostomy pink.  Drains still purulent.      ASSESSMENT AND PLAN:   ADENOCARCINOMA, RECTUM, [id number] Pt to get drain study in 1-2 weeks.  Follow up with me in 2 weeks  Add stool softener and miralax  He does have recurrent rectal cancer in pelvis and continues to have lung mets.  After abscesses are healed, he will resume chemotherapy.      Maudry Diego, MD Surgical Oncology, General & Endocrine Surgery [facility]  No primary provider on file. No ref. provider found

## 2011-12-21 ENCOUNTER — Telehealth (INDEPENDENT_AMBULATORY_CARE_PROVIDER_SITE_OTHER): Payer: Self-pay

## 2011-12-21 NOTE — Telephone Encounter (Signed)
[facility] nurse called to report that the patient has had two well formed stools through his colostomy.  Dr. [name] spoke with her and informed her that since he has a loop ileostomy this is not unheard of.  She recommended that the nurse keep an eye on him and call with any concerns.

## 2011-12-22 ENCOUNTER — Telehealth: Payer: Self-pay | Admitting: Oncology

## 2011-12-22 ENCOUNTER — Ambulatory Visit (HOSPITAL_BASED_OUTPATIENT_CLINIC_OR_DEPARTMENT_OTHER): Payer: Medicaid Other | Admitting: Nurse Practitioner

## 2011-12-22 VITALS — BP 127/78 | HR 113 | Temp 97.0°F | Resp 20 | Ht 64.0 in | Wt 114.7 lb

## 2011-12-22 DIAGNOSIS — M549 Dorsalgia, unspecified: Secondary | ICD-10-CM

## 2011-12-22 DIAGNOSIS — N509 Disorder of male genital organs, unspecified: Secondary | ICD-10-CM

## 2011-12-22 DIAGNOSIS — C7800 Secondary malignant neoplasm of unspecified lung: Secondary | ICD-10-CM

## 2011-12-22 DIAGNOSIS — C20 Malignant neoplasm of rectum: Secondary | ICD-10-CM

## 2011-12-22 NOTE — Telephone Encounter (Signed)
Gave pt appt for November 19th lab and MD, gave POF to [facility] for chemo scheduling

## 2011-12-22 NOTE — Progress Notes (Signed)
OFFICE PROGRESS NOTE  Interval history:  [name] returns as scheduled. He is feeling better. He is gaining strength. He has a good appetite. He denies nausea/vomiting. Colostomy is functioning. He has had several formed stools through the colostomy. The back and testicular pain are better. He takes Percocet as needed.   Objective: Blood pressure 127/78, pulse 113, temperature 97 F (36.1 C), temperature source Oral, resp. rate 20, height 5\' 4"  (1.626 m), weight 114 lb 11.2 oz (52.028 kg).  Oropharynx is without thrush or ulceration. Lungs are clear. Regular cardiac rhythm. Port-A-Cath site is without erythema. Abdomen is soft and nontender. No hepatomegaly. He has a colostomy. He continues to have 3 drains. Midline abdominal wound continues to heal. Extremities are without edema.  Lab Results: Lab Results  Component Value Date   WBC 11.4* 12/02/2011   HGB 7.8* 12/02/2011   HCT 24.9* 12/02/2011   MCV 86.5 12/02/2011   PLT 540* 12/02/2011    Chemistry:    Chemistry      Component Value Date/Time   NA 136 12/02/2011 0600   K 4.1 12/02/2011 0600   CL 100 12/02/2011 0600   CO2 29 12/02/2011 0600   BUN 9 12/02/2011 0600   CREATININE 0.64 12/02/2011 0600      Component Value Date/Time   CALCIUM 8.3* 12/02/2011 0600   ALKPHOS 58 11/29/2011 0625   AST 13 11/29/2011 0625   ALT 13 11/29/2011 0625   BILITOT 0.3 11/29/2011 0625       Studies/Results: Ct Chest W Contrast  12/13/2011  *RADIOLOGY REPORT*  Clinical Data:  Follow up fluid collections.  History of metastatic rectal cancer.  CT CHEST, ABDOMEN AND PELVIS WITH CONTRAST  Technique:  Multidetector CT imaging of the chest, abdomen and pelvis was performed following the standard protocol during bolus administration of intravenous contrast.  Contrast: 80mL OMNIPAQUE IOHEXOL 300 MG/ML  SOLN  Comparison:  11/28/2011  CT CHEST  Findings:  Right chest wall porta-catheter is identified with the tip in the distal SVC.  There is no  mediastinal or hilar adenopathy.  No pericardial effusion.  No pleural effusion identified.  Multiple pulmonary parenchymal nodules are identified throughout both lungs consistent with metastatic disease.  Index right upper lobe nodule measures 2.2 cm, image 30.  Previously 1.9 cm.  Right lower lobe pulmonary nodule measures 1.9 cm, image 34.  Previously 1.2 cm.  Left lower lobe pulmonary nodule measures 9.6 mm, image 33.  Previously 8.9 mm.  Review of the visualized osseous structures shows no worrisome lytic or sclerotic bone lesions.  IMPRESSION:  1.  No acute findings. 2.  Mild progression of pulmonary metastatic disease.  CT ABDOMEN AND PELVIS  Findings:  There is no suspicious liver abnormality.  Gallbladder is normal. No biliary dilatation.  The pancreas is unremarkable.  Normal appearance of the spleen.  The adrenal glands appear normal.  Cyst within the right kidney is identified measuring 1.4 cm.  The left kidney is normal.  Normal appearance of the urinary bladder.  No enlarged upper abdominal lymph nodes.  No pelvic or inguinal adenopathy.  Small hiatal hernia.  The stomach is otherwise unremarkable.  The patient has a left lower quadrant colostomy.  Dilated loops of small bowel within the right upper quadrant of the abdomen measure up to 6 cm.  There is a moderate to marked stool burden identified within the colon.   Left upper quadrant pigtail drainage catheter is identified with resolution of previously noted left upper quadrant fluid collection.  Fluid collection along the undersurface of the left lower quadrant ventral abdominal wall is improved from previous exam.  There is residual gas and fluid collection in this area which measures 1.5 x 5.6 x 7.8 cm.  Within the central portion of the pelvis there is a percutaneous pigtail drainage catheter with resolution of previously noted fluid collection in this area. The previously noted right perirectal fluid collection has resolved in the interval.   Similar appearance of soft tissue thickening and enhancement within the perirectal region.  On today's study this measures 4 x 4.7 cm, image 107.  Previously this measured 3.7 x 4.8 cm.  No adenopathy identified within the abdomen or the pelvis.  Review of the visualized osseous structures shows no aggressive lytic or sclerotic bone lesions.  IMPRESSION:  1.  Interval resolution of left upper quadrant and central pelvic fluid collections. 2.  Residual gas and fluid collection is identified in the undersurface of the left lower quadrant ventral abdominal wall. Drainage catheter remains in place. 3.  Similar appearance of the soft tissue thickening and enhancement within the perirectal region, nonspecific. 4.  Abnormal small bowel dilatation and moderate colonic stool burden consistent with ileus and/or constipation.   Original Report Authenticated By: Rosealee Albee, M.D.    Ct Guided Abscess Drain  11/29/2011  *RADIOLOGY REPORT*  Indication: Residual fluid collections post multiple prior percutaneous drain placements.  CT GUIDED RIGHT LOWER ABDOMINAL QUADRANT DRAINAGE CATHETER PLACEMENT CT GUIDED LEFT UPPER ABDOMINAL QUADRANT DRAINAGE CATHETER PLACEMENT  Comparison: CT abdomen pelvis - 11/29/2011  Medications: Fentanyl 200 mcg IV; Versed 3 mg IV  Total Moderate Sedation time: 43 minutes  Contrast: None  Complications: None immediate  Technique / Findings:  Informed written consent was obtained from the patient after a discussion of the risks, benefits and alternatives to treatment. The patient was placed supine on the CT gantry and a pre procedural CT was performed re-demonstrating the known abscess/fluid collections within the both the right lower and left upper abdominal quadrants.  The procedure was planned.   A timeout was performed prior to the initiation of the procedure.  The skin overlying the right lower abdominal quadrant was prepped and draped in the usual sterile fashion.   The overlying soft  tissues were anesthetized with 1% lidocaine with epinephrine. Appropriate trajectory was planned with the use of a 22 gauge spinal needle.  An 18 gauge trocar needle was advanced into the abscess/fluid collection and a short Amplatz super stiff wire was coiled within the collection.   Appropriate positioning was confirmed with a limited CT scan.  The tract was serially dilated allowing placement of a 10 [place] all-purpose drainage catheter. Appropriate positioning was confirmed with a limited postprocedural CT scan.  25 ml of purulent fluid was aspirated.  The tube was connected to a drainage bag and sutured in place.  A dressing was placed.  Attention was now paid towards the residual fluid collection in the left upper abdominal quadrant.  The skin overlying the left upper abdomen was prepped and draped in the usual sterile fashion.   The overlying soft tissues were anesthetized with 1% lidocaine with epinephrine.  Appropriate trajectory was planned with the use of a 22 gauge spinal needle.  An 18 gauge trocar needle was advanced into the abscess/fluid collection and a short Amplatz super stiff wire was coiled within the collection.   Appropriate positioning was confirmed with a limited CT scan.  The tract was serially dilated allowing placement of a 10  [place] all-purpose drainage catheter.  Appropriate positioning was confirmed with a limited postprocedural CT scan.  75 ml of purulent fluid was aspirated.  The tube was connected to a drainage bag and sutured in place.  A dressing was placed.  The patient tolerated the above procedures well without immediate post procedural complication.  Impression:  Successful CT guided placement of a 10 French all purpose drain catheters into the residual abscesses/fluid collections within the right lower and the left upper abdominal quadrants. Samples were sent to the laboratory from both drainage catheters as requested by the ordering clinical team.   Original Report  Authenticated By: Waynard Reeds, M.D.    Ct Guided Abscess Drain  11/29/2011  *RADIOLOGY REPORT*  Indication: Residual fluid collections post multiple prior percutaneous drain placements.  CT GUIDED RIGHT LOWER ABDOMINAL QUADRANT DRAINAGE CATHETER PLACEMENT CT GUIDED LEFT UPPER ABDOMINAL QUADRANT DRAINAGE CATHETER PLACEMENT  Comparison: CT abdomen pelvis - 11/29/2011  Medications: Fentanyl 200 mcg IV; Versed 3 mg IV  Total Moderate Sedation time: 43 minutes  Contrast: None  Complications: None immediate  Technique / Findings:  Informed written consent was obtained from the patient after a discussion of the risks, benefits and alternatives to treatment. The patient was placed supine on the CT gantry and a pre procedural CT was performed re-demonstrating the known abscess/fluid collections within the both the right lower and left upper abdominal quadrants.  The procedure was planned.   A timeout was performed prior to the initiation of the procedure.  The skin overlying the right lower abdominal quadrant was prepped and draped in the usual sterile fashion.   The overlying soft tissues were anesthetized with 1% lidocaine with epinephrine. Appropriate trajectory was planned with the use of a 22 gauge spinal needle.  An 18 gauge trocar needle was advanced into the abscess/fluid collection and a short Amplatz super stiff wire was coiled within the collection.   Appropriate positioning was confirmed with a limited CT scan.  The tract was serially dilated allowing placement of a 10 [place] all-purpose drainage catheter. Appropriate positioning was confirmed with a limited postprocedural CT scan.  25 ml of purulent fluid was aspirated.  The tube was connected to a drainage bag and sutured in place.  A dressing was placed.  Attention was now paid towards the residual fluid collection in the left upper abdominal quadrant.  The skin overlying the left upper abdomen was prepped and draped in the usual sterile fashion.   The  overlying soft tissues were anesthetized with 1% lidocaine with epinephrine.  Appropriate trajectory was planned with the use of a 22 gauge spinal needle.  An 18 gauge trocar needle was advanced into the abscess/fluid collection and a short Amplatz super stiff wire was coiled within the collection.   Appropriate positioning was confirmed with a limited CT scan.  The tract was serially dilated allowing placement of a 10 [place] all-purpose drainage catheter.  Appropriate positioning was confirmed with a limited postprocedural CT scan.  75 ml of purulent fluid was aspirated.  The tube was connected to a drainage bag and sutured in place.  A dressing was placed.  The patient tolerated the above procedures well without immediate post procedural complication.  Impression:  Successful CT guided placement of a 10 French all purpose drain catheters into the residual abscesses/fluid collections within the right lower and the left upper abdominal quadrants. Samples were sent to the laboratory from both drainage catheters as requested by the ordering clinical team.  Original Report Authenticated By: Waynard Reeds, M.D.    Ct Abdomen Pelvis W Contrast  12/13/2011  *RADIOLOGY REPORT*  Clinical Data:  Follow up fluid collections.  History of metastatic rectal cancer.  CT CHEST, ABDOMEN AND PELVIS WITH CONTRAST  Technique:  Multidetector CT imaging of the chest, abdomen and pelvis was performed following the standard protocol during bolus administration of intravenous contrast.  Contrast: 80mL OMNIPAQUE IOHEXOL 300 MG/ML  SOLN  Comparison:  11/28/2011  CT CHEST  Findings:  Right chest wall porta-catheter is identified with the tip in the distal SVC.  There is no mediastinal or hilar adenopathy.  No pericardial effusion.  No pleural effusion identified.  Multiple pulmonary parenchymal nodules are identified throughout both lungs consistent with metastatic disease.  Index right upper lobe nodule measures 2.2 cm, image 30.   Previously 1.9 cm.  Right lower lobe pulmonary nodule measures 1.9 cm, image 34.  Previously 1.2 cm.  Left lower lobe pulmonary nodule measures 9.6 mm, image 33.  Previously 8.9 mm.  Review of the visualized osseous structures shows no worrisome lytic or sclerotic bone lesions.  IMPRESSION:  1.  No acute findings. 2.  Mild progression of pulmonary metastatic disease.  CT ABDOMEN AND PELVIS  Findings:  There is no suspicious liver abnormality.  Gallbladder is normal. No biliary dilatation.  The pancreas is unremarkable.  Normal appearance of the spleen.  The adrenal glands appear normal.  Cyst within the right kidney is identified measuring 1.4 cm.  The left kidney is normal.  Normal appearance of the urinary bladder.  No enlarged upper abdominal lymph nodes.  No pelvic or inguinal adenopathy.  Small hiatal hernia.  The stomach is otherwise unremarkable.  The patient has a left lower quadrant colostomy.  Dilated loops of small bowel within the right upper quadrant of the abdomen measure up to 6 cm.  There is a moderate to marked stool burden identified within the colon.   Left upper quadrant pigtail drainage catheter is identified with resolution of previously noted left upper quadrant fluid collection.  Fluid collection along the undersurface of the left lower quadrant ventral abdominal wall is improved from previous exam.  There is residual gas and fluid collection in this area which measures 1.5 x 5.6 x 7.8 cm.  Within the central portion of the pelvis there is a percutaneous pigtail drainage catheter with resolution of previously noted fluid collection in this area. The previously noted right perirectal fluid collection has resolved in the interval.  Similar appearance of soft tissue thickening and enhancement within the perirectal region.  On today's study this measures 4 x 4.7 cm, image 107.  Previously this measured 3.7 x 4.8 cm.  No adenopathy identified within the abdomen or the pelvis.  Review of the  visualized osseous structures shows no aggressive lytic or sclerotic bone lesions.  IMPRESSION:  1.  Interval resolution of left upper quadrant and central pelvic fluid collections. 2.  Residual gas and fluid collection is identified in the undersurface of the left lower quadrant ventral abdominal wall. Drainage catheter remains in place. 3.  Similar appearance of the soft tissue thickening and enhancement within the perirectal region, nonspecific. 4.  Abnormal small bowel dilatation and moderate colonic stool burden consistent with ileus and/or constipation.   Original Report Authenticated By: Rosealee Albee, M.D.    Ct Abdomen Pelvis W Contrast  11/28/2011  *RADIOLOGY REPORT*  Clinical Data: Rectal adenocarcinoma.  Recent bowel surgery. Follow-up abdominal and pelvic abscesses.  CT  ABDOMEN AND PELVIS WITH CONTRAST  Technique:  Multidetector CT imaging of the abdomen and pelvis was performed following the standard protocol during bolus administration of intravenous contrast.  Contrast: OMNIPAQUE IOHEXOL 300 MG/ML  SOLN  Comparison: 11/20/2011  Findings: A new left lower quadrant percutaneous drainage catheter is seen just deep to the rectus abdominis muscles. There is been near complete resolution of the anterior pelvic fluid collection seen in this region. There has also been interval decrease in the size of a communicating fluid collection in the anterior left abdomen extending into the left subphrenic space.  A persistent, separately loculated fluid and gas collection is seen in the right lower quadrant in the upper pelvis which measures 4.8 x 9.8 cm.  Adjacent anastomotic staples are noted in the right lower quadrant.  A right transgluteal drainage catheter has been pulled back into the gluteus maximus muscle, and there has been reappearance of a small fluid and tail gas collection in the right perirectal region measuring 2.0 x 3.1 cm.  Diffuse dilatation of small bowel and colonic distention is  again demonstrated, consistent with ileus.  No evidence of bowel obstruction.  Left lower quadrant colostomy again noted with orally administered contrast reaching the colostomy site.  Mild right hydronephrosis is seen which is increased since previous study and is likely related to the right lower quadrant fluid collection and associated inflammatory changes. A small right renal cyst is again noted.  The abdominal parenchymal organs are otherwise unremarkable appearance.  Gallbladder appears normal.  Images of the lung bases again show diffuse bilateral pulmonary metastases.  Small left pleural effusion and pericardial effusion are mildly increased in size.  IMPRESSION:  1.  Near complete resolution of left lower quadrant fluid collection, and decreased size of communicating left anterior abdominal and left subphrenic collections following drainage catheter placement. 2.  Persistent 5 x 10 cm fluid and gas collection in the right lower quadrant and upper pelvis, consistent with a separately loculated fluid collection or abscess. 3.  Right transgluteal drainage catheter has been pulled back into the gluteus muscle, with reappearance of 2 x 3 cm fluid and gas collection in the right perirectal region. 4.  Increased mild right hydronephrosis. 5.  Persistent adynamic ileus. 6.  Increased small left pleural effusion and pericardial effusion. 7.  Stable bibasilar pulmonary metastases.   Original Report Authenticated By: Danae Orleans, M.D.    Dg Abd Acute W/chest  11/25/2011  *RADIOLOGY REPORT*  Clinical Data: Abdominal pain, rectal cancer with mets, small bowel resection with colostomy  ACUTE ABDOMEN SERIES (ABDOMEN 2 VIEW & CHEST 1 VIEW)  Comparison: Chest radiograph dated 11/22/2011.  CT abdomen pelvis dated 11/20/2011.  Findings: Stable pulmonary metastases in the right mid/lower lung. No pleural effusion or pneumothorax.  Cardiomediastinal silhouette is within normal limits.  Right chest power port.   Postsurgical changes with suture line in the left mid abdomen.  Two pigtail drains in the pelvis.  Left lower quadrant colostomy.  Again seen are dilated loops of small bowel in the right mid abdomen.  No evidence of free air under the diaphragm on the upright view.  IMPRESSION: Stable pulmonary metastases.  No evidence of free air.  Stable dilated loops of small bowel in the right mid abdomen, possibly reflecting adynamic ileus or partial small bowel obstruction, correlate with colostomy output.   Original Report Authenticated By: Charline Bills, M.D.     Medications: I have reviewed the patient's current medications.  Assessment/Plan:  1. Clinical stage  III (uT3 uN1) rectal cancer status post pelvic radiation, August 24 through November 18, 2009. 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 There was no lymph node involvement (pT3 pN0). He began adjuvant CAPOX chemotherapy March 11, 2010. He completed the sixth and final cycle of CAPOX 06/25/2010. CT of the abdomen and pelvis 03/11/2011 confirmed multiple nodules at the lung bases consistent with metastatic disease. 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 He completed cycle 1 of FOLFIRI/Avastin 04/29/2011. He completed cycle 3 05/27/2011. CEA was improved on 06/10/2011. He completed cycle 4 on 06/10/2011. He completed cycle 5 on 06/24/2011. He completed cycle 6 on 07/08/2011. Restaging CT evaluation 07/22/2011 showed a slight decrease in pulmonary nodules. He completed cycle 8 FOLFIRI/Avastin on 08/12/2011. He completed cycle 9 on 09/02/2011. Chest CT on 10/07/2011 showed increased size and number of lung nodules. CT scans 12/13/2011  with mild progression of pulmonary metastatic disease; interval resolution of left upper quadrant and central pelvic fluid collections; residual gas and fluid collection in the undersurface of the left lower quadrant ventral abdominal wall; similar appearance of soft tissue thickening and enhancement to the perirectal region; abnormal small bowel dilatation and moderate colonic stool burden consistent with ileus and/or constipation. 2. Strangulated peristomal hernia and small bowel obstruction status post reduction of peristomal hernia, omentectomy, exploratory laparotomy, small bowel resection x2 on January 28, 2010.  3. History of rectal bleeding secondary to number 1.  4. History of an elevated CEA. Improved on 06/10/2011 and 07/29/2011. 5. History of a left adrenal nodule.  6. Weight loss. 7. Right knee pain/effusion status post evaluation at [facility].  8. CT abdomen/pelvis 03/11/2011 with multiple nodules at the lung bases consistent with metastatic involvement of the lungs; prominent presacral soft tissue; air bubble within the presacral soft tissue appeared to be extraluminal and breakdown at the anastomotic site could not be excluded. No discrete abscess was seen. 9. Elevated blood pressure. Question effect of Avastin. He was started on Norvasc on 06/10/2011.  10. Frequent bowel movements. Likely related to multiple bowel resections. Imodium was not effective. 11. Suture at the ileostomy reversal scar. Suture removed during the recent hospitalization. 12. Erectile dysfunction. The testosterone level returned at 508 on 08/12/2011, he will be referred to urology 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 Culture returned positive for abundant Escherichia coli. He completed a course of Augmentin.  14. Status post diverting loop descending colostomy 10/05/2011. 15. Status post multiple rectal biopsies 10/05/2011, all negative for  malignancy. 16. Hospitalization 10/13/2011 with a bowel obstruction. Resolved. 17. Hospitalization 11/07/2011 with a recurrent small bowel obstruction. He was taken to the OR on 11/10/2011 and underwent an exploratory laparotomy with small bowel resection x2 and incidental appendectomy. He was found to have a closed loop obstruction. Pathology on #1 small bowel resection showed focal perforation with no evidence of malignancy; pathology on #2 small bowel resection showed metastatic adenocarcinoma consistent with colorectal primary; pathology on the appendix showed serosal fibrous adhesions with no evidence of malignancy.He developed postoperative respiratory failure, sepsis and renal insufficiency. He improved gradually and was able to be extubated. He required placement of multiple percutaneous drains for intraperitoneal abscesses. He was discharged home on 12/02/2011. 18. Deconditioning. Improved. 19. Left paraspinous pain/tenderness at the lower thoracic/upper  lumbar region and intermittent testicular pain. Question etiology. Improved.  Disposition-Jeffery [name] appears improved. He is scheduled for a drain study 12/28/2011 and a followup visit with Dr. [name] on 01/03/2012. He will return for a followup visit with Dr. Truett Perna on 01/04/2012 with plans to proceed with cycle 1  FOLFIRINOX. He will contact the office in the interim with any problems.  Plan reviewed with Dr. Truett Perna.  Lonna Cobb ANP/GNP-BC

## 2011-12-28 ENCOUNTER — Other Ambulatory Visit (INDEPENDENT_AMBULATORY_CARE_PROVIDER_SITE_OTHER): Payer: Self-pay | Admitting: General Surgery

## 2011-12-28 ENCOUNTER — Telehealth (INDEPENDENT_AMBULATORY_CARE_PROVIDER_SITE_OTHER): Payer: Self-pay | Admitting: General Surgery

## 2011-12-28 ENCOUNTER — Ambulatory Visit (HOSPITAL_COMMUNITY)
Admission: RE | Admit: 2011-12-28 | Discharge: 2011-12-28 | Disposition: A | Discharge status: Home / Self Care | Payer: Medicaid Other | Source: Ambulatory Visit | Attending: General Surgery | Admitting: General Surgery

## 2011-12-28 DIAGNOSIS — K651 Peritoneal abscess: Secondary | ICD-10-CM

## 2011-12-28 DIAGNOSIS — C20 Malignant neoplasm of rectum: Secondary | ICD-10-CM | POA: Insufficient documentation

## 2011-12-28 NOTE — Telephone Encounter (Signed)
Lyla Son, nurse with [facility], called to report that two of the three JP drains were pulled at pt's IR appt today.  Needed order for daily DSD to the remaining JP drain; order was given.

## 2011-12-30 ENCOUNTER — Telehealth: Payer: Self-pay | Admitting: Oncology

## 2011-12-30 NOTE — Telephone Encounter (Signed)
Talked pt and he is aware of appt for lab, MD and chemo

## 2012-01-03 ENCOUNTER — Encounter (INDEPENDENT_AMBULATORY_CARE_PROVIDER_SITE_OTHER): Payer: Medicaid Other | Admitting: General Surgery

## 2012-01-04 ENCOUNTER — Telehealth: Payer: Self-pay | Admitting: Oncology

## 2012-01-04 ENCOUNTER — Other Ambulatory Visit (HOSPITAL_BASED_OUTPATIENT_CLINIC_OR_DEPARTMENT_OTHER): Payer: Medicaid Other | Admitting: Lab

## 2012-01-04 ENCOUNTER — Ambulatory Visit: Payer: Medicaid Other

## 2012-01-04 ENCOUNTER — Ambulatory Visit (HOSPITAL_BASED_OUTPATIENT_CLINIC_OR_DEPARTMENT_OTHER): Payer: Medicaid Other | Admitting: Oncology

## 2012-01-04 ENCOUNTER — Other Ambulatory Visit: Payer: Self-pay | Admitting: *Deleted

## 2012-01-04 VITALS — BP 137/84 | HR 134 | Temp 97.2°F | Resp 20 | Ht 64.0 in | Wt 115.8 lb

## 2012-01-04 DIAGNOSIS — N509 Disorder of male genital organs, unspecified: Secondary | ICD-10-CM

## 2012-01-04 DIAGNOSIS — C20 Malignant neoplasm of rectum: Secondary | ICD-10-CM

## 2012-01-04 DIAGNOSIS — C7800 Secondary malignant neoplasm of unspecified lung: Secondary | ICD-10-CM

## 2012-01-04 DIAGNOSIS — M549 Dorsalgia, unspecified: Secondary | ICD-10-CM

## 2012-01-04 LAB — CBC WITH DIFFERENTIAL/PLATELET
BASO%: 0.5 % (ref 0.0–2.0)
EOS%: 0.3 % (ref 0.0–7.0)
HCT: 38 % — ABNORMAL LOW (ref 38.4–49.9)
LYMPH%: 14 % (ref 14.0–49.0)
MCH: 30.5 pg (ref 27.2–33.4)
MCHC: 33.4 g/dL (ref 32.0–36.0)
MONO%: 7.6 % (ref 0.0–14.0)
NEUT%: 77.6 % — ABNORMAL HIGH (ref 39.0–75.0)
Platelets: 322 10*3/uL (ref 140–400)
RBC: 4.16 10*6/uL — ABNORMAL LOW (ref 4.20–5.82)

## 2012-01-04 LAB — COMPREHENSIVE METABOLIC PANEL (CC13)
ALT: 25 U/L (ref 0–55)
AST: 22 U/L (ref 5–34)
Alkaline Phosphatase: 84 U/L (ref 40–150)
Creatinine: 0.8 mg/dL (ref 0.7–1.3)
Sodium: 139 mEq/L (ref 136–145)
Total Bilirubin: 0.51 mg/dL (ref 0.20–1.20)

## 2012-01-04 LAB — CEA: CEA: 18.1 ng/mL — ABNORMAL HIGH (ref 0.0–5.0)

## 2012-01-04 MED ORDER — LIDOCAINE-PRILOCAINE 2.5-2.5 % EX CREA: TOPICAL_CREAM | CUTANEOUS | Status: DC | PRN

## 2012-01-04 MED ORDER — OXYCODONE-ACETAMINOPHEN 10-325 MG PO TABS: 1.0000 | ORAL_TABLET | ORAL | Status: DC | PRN

## 2012-01-04 MED ORDER — LORAZEPAM 0.5 MG PO TABS: ORAL_TABLET | ORAL | Status: DC

## 2012-01-04 NOTE — Telephone Encounter (Signed)
gv pt appt schedule for November and December.  °

## 2012-01-04 NOTE — Progress Notes (Signed)
[facility]    OFFICE PROGRESS NOTE   INTERVAL HISTORY:   He returns as scheduled. He rescheduled an appointment with Dr. [name] to 01/10/2012. One abdominal drain remains in place and he reports only a small amount of drainage each day. He continues to have pain in the low back when supine. He also has pain in the right greater than left testicle when supine. He has difficulty sleeping secondary to pain and insomnia. He is taking approximately 8 Percocet tablets per day for relief of pain. Good appetite. No other complaint.  Objective:  Vital signs in last 24 hours:  Blood pressure 137/84, pulse 134, temperature 97.2 F (36.2 C), temperature source Oral, resp. rate 20, height 5\' 4"  (1.626 m), weight 115 lb 12.8 oz (52.527 kg).    Resp: Lungs clear bilaterally Cardio: Regular rate and rhythm GI: No hepatomegaly, low abdominal colostomy with formed stool, nontender, no mass, abdominal drain with a small amount of hand fluid Vascular: No leg edema GU: The right testicle is slightly larger than the left side with tenderness and? Erythema  Musculoskeletal: No spine tenderness   Portacath/PICC-without erythema  Lab Results:  Lab Results  Component Value Date   WBC 7.9 01/04/2012   HGB 12.7* 01/04/2012   HCT 38.0* 01/04/2012   MCV 91.5 01/04/2012   PLT 322 01/04/2012      Medications: I have reviewed the patient's current medications.  Assessment/Plan: 1. Clinical stage III (uT3 uN1) rectal cancer status post pelvic radiation, August 24 through November 18, 2009. 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 There was no lymph node involvement (pT3 pN0). He began adjuvant CAPOX chemotherapy March 11, 2010.  He completed the sixth and final cycle of CAPOX 06/25/2010. CT of the abdomen and pelvis 03/11/2011 confirmed multiple nodules at the lung bases consistent with metastatic disease. 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 He completed cycle 1 of FOLFIRI/Avastin 04/29/2011. He completed cycle 3 05/27/2011. CEA was improved on 06/10/2011. He completed cycle 4 on 06/10/2011. He completed cycle 5 on 06/24/2011. He completed cycle 6 on 07/08/2011. Restaging CT evaluation 07/22/2011 showed a slight decrease in pulmonary nodules. He completed cycle 8 FOLFIRI/Avastin on 08/12/2011. He completed cycle 9 on 09/02/2011. Chest CT on 10/07/2011 showed increased size and number of lung nodules. CT scans 12/13/2011 with mild progression of pulmonary metastatic disease; interval resolution of left upper quadrant and central pelvic fluid collections; residual gas and fluid collection in the undersurface of the left lower quadrant ventral abdominal wall; similar appearance of soft tissue thickening and enhancement to the perirectal region; abnormal small bowel dilatation and moderate colonic stool burden consistent with ileus and/or constipation. 2. Strangulated peristomal hernia and small bowel obstruction status post reduction of peristomal hernia, omentectomy, exploratory laparotomy, small bowel resection x2 on January 28, 2010.  3. History of rectal bleeding secondary to number 1.  4. History of an elevated CEA. Improved on 06/10/2011 and 07/29/2011. 5. History of a left adrenal nodule.  6. Weight loss. 7. Right knee pain/effusion status post evaluation at [facility]  8. CT abdomen/pelvis 03/11/2011 with multiple nodules at the lung bases consistent with metastatic involvement of the lungs; prominent presacral soft tissue; air bubble within the presacral soft tissue appeared to be extraluminal and breakdown  at the anastomotic site could not  be excluded. No discrete abscess was seen. 9. Elevated blood pressure. Question effect of Avastin. He was started on Norvasc on 06/10/2011.  10. Frequent bowel movements. Likely related to multiple bowel resections. Imodium was not effective. 11. Suture at the ileostomy reversal scar. Suture removed during the recent hospitalization. 12. Erectile dysfunction. The testosterone level returned at 508 on 08/12/2011, he was referred to urology, but Medicaid would not pay for this visit 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 Culture returned positive for abundant Escherichia coli. He completed a course of Augmentin.  14. Status post diverting loop descending colostomy 10/05/2011. 15. Status post multiple rectal biopsies 10/05/2011, all negative for malignancy. 16. Hospitalization 10/13/2011 with a bowel obstruction. Resolved. 17. Hospitalization 11/07/2011 with a recurrent small bowel obstruction. He was taken to the OR on 11/10/2011 and underwent an exploratory laparotomy with small bowel resection x2 and incidental appendectomy. He was found to have a closed loop obstruction. Pathology on #1 small bowel resection showed focal perforation with no evidence of malignancy; pathology on #2 small bowel resection showed metastatic adenocarcinoma consistent with colorectal primary; pathology on the appendix showed serosal fibrous adhesions with no evidence of malignancy.He developed postoperative respiratory failure, sepsis and renal insufficiency. He improved gradually and was able to be extubated. He required placement of multiple percutaneous drains for intraperitoneal abscesses. He was discharged home on 12/02/2011. 18. Deconditioning. Improved. 19. Low back and testicular pain when supine, tenderness of the right testicle,? Etiology   Disposition:  Jeffery [name] continues to have pain in the low back and testicles when supine. The etiology of this pain is unclear, but  is most likely related to metastatic rectal cancer. He plans to see Dr. [name] on 01/10/2012.  I discussed treatment options with him again today. He understands treatment options with "standard "chemotherapy is limited based on his previous treatment with CAPOX and FOLFIRI/Avastin. We discussed treatment with FOLFOXIRI and regorafenib. He understands the increased potential toxicity associated with the FOLFOXIRI including increased chance for hematologic toxicity. I recommend treatment with FOLFOXIRI given his previous treatment history and the low response rate associated with regorafenib. He denies neuropathy symptoms at present.  We obtained a baseline CEA today. He will see Dr. [name] on 01/10/2012. Jeffery [name] is scheduled for a first cycle of salvage chemotherapy on 01/12/2012. He is scheduled for an office visit and cycle 2 on 01/26/2012.   Thornton Papas, MD  01/04/2012  6:02 PM

## 2012-01-06 ENCOUNTER — Telehealth (HOSPITAL_COMMUNITY): Payer: Self-pay | Admitting: General Surgery

## 2012-01-07 ENCOUNTER — Ambulatory Visit (HOSPITAL_COMMUNITY)
Admission: RE | Admit: 2012-01-07 | Discharge: 2012-01-07 | Disposition: A | Discharge status: Home / Self Care | Payer: Medicaid Other | Source: Ambulatory Visit | Attending: General Surgery | Admitting: General Surgery

## 2012-01-07 DIAGNOSIS — C20 Malignant neoplasm of rectum: Secondary | ICD-10-CM | POA: Insufficient documentation

## 2012-01-07 DIAGNOSIS — K651 Peritoneal abscess: Secondary | ICD-10-CM | POA: Insufficient documentation

## 2012-01-07 MED ORDER — IOHEXOL 300 MG/ML  SOLN: 15.0000 mL | Freq: Once | INTRAMUSCULAR | Status: AC | PRN

## 2012-01-07 NOTE — Procedures (Signed)
Fluoroscopic guided evaluation of existing right lower quadrant drain demonstrates persistent communication to the adjacent bowel.  Drainage catheter left in place and connected to a drainage bag.

## 2012-01-10 ENCOUNTER — Ambulatory Visit (INDEPENDENT_AMBULATORY_CARE_PROVIDER_SITE_OTHER): Payer: Medicaid Other | Admitting: General Surgery

## 2012-01-10 ENCOUNTER — Encounter (INDEPENDENT_AMBULATORY_CARE_PROVIDER_SITE_OTHER): Payer: Self-pay | Admitting: General Surgery

## 2012-01-10 VITALS — BP 132/68 | HR 74 | Temp 97.0°F | Resp 18 | Ht 64.0 in | Wt 118.2 lb

## 2012-01-10 DIAGNOSIS — C20 Malignant neoplasm of rectum: Secondary | ICD-10-CM

## 2012-01-10 DIAGNOSIS — K651 Peritoneal abscess: Secondary | ICD-10-CM

## 2012-01-10 NOTE — Progress Notes (Signed)
HISTORY: Pt s/p lap diverting colostomy followed by ex lap for bowel obstruction.  Doing better.  Required sig ICU stay for sepsis.  Had multiple drains placed.  Was extremely sick at one point.  Recent drain study shows communication to bowel of right drain.  Remaining drains are out.  No fever/chills.  Starting to regain weight.      EXAM: General:  Alert and oriented Incision:  Healing well.     PATHOLOGY: Recurrent rectal cancer in pelvis.     ASSESSMENT AND PLAN:   Abscess of male pelvis Drain still with connection to bowel.  Recheck in around 4 weeks.   Follow up in around 2 months.    Continue exercising.  Counseled to stop smoking.    ADENOCARCINOMA, RECTUM, [id number] Recurrent cancer in lung and pelvis.    Chemo to start back this week.       Maudry Diego, MD Surgical Oncology, General & Endocrine Surgery [facility]  No primary provider on file. Almond Lint, MD

## 2012-01-10 NOTE — Assessment & Plan Note (Signed)
Drain still with connection to bowel.  Recheck in around 4 weeks.   Follow up in around 2 months.    Continue exercising.  Counseled to stop smoking.

## 2012-01-10 NOTE — Assessment & Plan Note (Signed)
Recurrent cancer in lung and pelvis.    Chemo to start back this week.

## 2012-01-10 NOTE — Patient Instructions (Signed)
Get repeat drain study in 4 weeks.  Start chemo this week.  Stop smoking.

## 2012-01-12 ENCOUNTER — Other Ambulatory Visit: Payer: Self-pay | Admitting: *Deleted

## 2012-01-12 ENCOUNTER — Other Ambulatory Visit: Payer: Self-pay | Admitting: Oncology

## 2012-01-12 ENCOUNTER — Ambulatory Visit (HOSPITAL_BASED_OUTPATIENT_CLINIC_OR_DEPARTMENT_OTHER): Payer: Medicaid Other

## 2012-01-12 VITALS — BP 125/83 | HR 94 | Temp 97.5°F

## 2012-01-12 DIAGNOSIS — Z5111 Encounter for antineoplastic chemotherapy: Secondary | ICD-10-CM

## 2012-01-12 DIAGNOSIS — Z452 Encounter for adjustment and management of vascular access device: Secondary | ICD-10-CM

## 2012-01-12 DIAGNOSIS — C7800 Secondary malignant neoplasm of unspecified lung: Secondary | ICD-10-CM

## 2012-01-12 DIAGNOSIS — C20 Malignant neoplasm of rectum: Secondary | ICD-10-CM

## 2012-01-12 MED ORDER — LEUCOVORIN CALCIUM INJECTION 350 MG
200.0000 mg/m2 | Freq: Once | INTRAVENOUS | Status: AC
  Administered 2012-01-12: 312 mg via INTRAVENOUS
  Filled 2012-01-12: qty 15.6

## 2012-01-12 MED ORDER — HEPARIN SOD (PORK) LOCK FLUSH 100 UNIT/ML IV SOLN
500.0000 [IU] | Freq: Once | INTRAVENOUS | Status: DC | PRN
  Filled 2012-01-12: qty 5

## 2012-01-12 MED ORDER — DEXTROSE 5 % IV SOLN
125.0000 mg/m2 | Freq: Once | INTRAVENOUS | Status: AC
  Administered 2012-01-12: 196 mg via INTRAVENOUS
  Filled 2012-01-12: qty 9.8

## 2012-01-12 MED ORDER — OXALIPLATIN CHEMO INJECTION 100 MG/20ML
85.0000 mg/m2 | Freq: Once | INTRAVENOUS | Status: AC
  Administered 2012-01-12: 135 mg via INTRAVENOUS
  Filled 2012-01-12: qty 27

## 2012-01-12 MED ORDER — ONDANSETRON 16 MG/50ML IVPB (CHCC)
16.0000 mg | Freq: Once | INTRAVENOUS | Status: AC
  Administered 2012-01-12: 16 mg via INTRAVENOUS

## 2012-01-12 MED ORDER — SODIUM CHLORIDE 0.9 % IV SOLN
2400.0000 mg/m2 | INTRAVENOUS | Status: DC
  Administered 2012-01-12: 3750 mg via INTRAVENOUS
  Filled 2012-01-12: qty 75

## 2012-01-12 MED ORDER — ALTEPLASE 2 MG IJ SOLR
2.0000 mg | Freq: Once | INTRAMUSCULAR | Status: AC | PRN
  Administered 2012-01-12: 2 mg
  Filled 2012-01-12: qty 2

## 2012-01-12 MED ORDER — ONDANSETRON HCL 8 MG PO TABS: 8.0000 mg | ORAL_TABLET | Freq: Two times a day (BID) | ORAL | Status: DC | PRN

## 2012-01-12 MED ORDER — DEXTROSE 5 % IV SOLN
Freq: Once | INTRAVENOUS | Status: AC
  Administered 2012-01-12: 13:00:00 via INTRAVENOUS

## 2012-01-12 MED ORDER — SODIUM CHLORIDE 0.9 % IJ SOLN
10.0000 mL | INTRAMUSCULAR | Status: DC | PRN
  Filled 2012-01-12: qty 10

## 2012-01-12 MED ORDER — DEXAMETHASONE SODIUM PHOSPHATE 4 MG/ML IJ SOLN
20.0000 mg | Freq: Once | INTRAMUSCULAR | Status: AC
  Administered 2012-01-12: 20 mg via INTRAVENOUS

## 2012-01-12 NOTE — Patient Instructions (Signed)
[facility] Discharge Instructions for Patients Receiving Chemotherapy  Today you received the following chemotherapy agents  Camptosar, oxaliplatin, 5 FU and leucovorin.  To help prevent nausea and vomiting after your treatment, we encourage you to take your nausea medication zofran Begin taking it tonight and take it as often as prescribed .[name]   If you develop nausea and vomiting that is not controlled by your nausea medication, call the clinic. If it is after clinic hours your family physician or the after hours number for the clinic or go to the Emergency Department.   BELOW ARE SYMPTOMS THAT SHOULD BE REPORTED IMMEDIATELY:  *FEVER GREATER THAN 100.5 F  *CHILLS WITH OR WITHOUT FEVER  NAUSEA AND VOMITING THAT IS NOT CONTROLLED WITH YOUR NAUSEA MEDICATION  *UNUSUAL SHORTNESS OF BREATH  *UNUSUAL BRUISING OR BLEEDING  TENDERNESS IN MOUTH AND THROAT WITH OR WITHOUT PRESENCE OF ULCERS  *URINARY PROBLEMS  *BOWEL PROBLEMS  UNUSUAL RASH Items with * indicate a potential emergency and should be followed up as soon as possible.  One of the nurses will contact you 24 hours after your treatment. Please let the nurse know about any problems that you may have experienced. Feel free to call the clinic you have any questions or concerns. The clinic phone number is [phone].   I have been informed and understand all the instructions given to me. I know to contact the clinic, my physician, or go to the Emergency Department if any problems should occur. I do not have any questions at this time, but understand that I may call the clinic during office hours   should I have any questions or need assistance in obtaining follow up care.    __________________________________________  _____________  __________ Signature of Patient or Authorized Representative            Date                   Time    __________________________________________ Nurse's Signature

## 2012-01-14 ENCOUNTER — Ambulatory Visit (HOSPITAL_BASED_OUTPATIENT_CLINIC_OR_DEPARTMENT_OTHER): Payer: Medicaid Other

## 2012-01-14 VITALS — BP 131/83 | HR 118 | Temp 97.8°F

## 2012-01-14 DIAGNOSIS — C7800 Secondary malignant neoplasm of unspecified lung: Secondary | ICD-10-CM

## 2012-01-14 DIAGNOSIS — C20 Malignant neoplasm of rectum: Secondary | ICD-10-CM

## 2012-01-14 MED ORDER — SODIUM CHLORIDE 0.9 % IJ SOLN
10.0000 mL | INTRAMUSCULAR | Status: DC | PRN
  Administered 2012-01-14: 10 mL
  Filled 2012-01-14: qty 10

## 2012-01-14 MED ORDER — HEPARIN SOD (PORK) LOCK FLUSH 100 UNIT/ML IV SOLN
500.0000 [IU] | Freq: Once | INTRAVENOUS | Status: AC | PRN
  Administered 2012-01-14: 500 [IU]
  Filled 2012-01-14: qty 5

## 2012-01-14 NOTE — Patient Instructions (Signed)
Call MD for problems 

## 2012-01-23 ENCOUNTER — Other Ambulatory Visit: Payer: Self-pay | Admitting: Oncology

## 2012-01-26 ENCOUNTER — Ambulatory Visit (HOSPITAL_BASED_OUTPATIENT_CLINIC_OR_DEPARTMENT_OTHER): Payer: Medicaid Other

## 2012-01-26 ENCOUNTER — Telehealth: Payer: Self-pay | Admitting: Oncology

## 2012-01-26 ENCOUNTER — Other Ambulatory Visit (HOSPITAL_BASED_OUTPATIENT_CLINIC_OR_DEPARTMENT_OTHER): Payer: Medicaid Other | Admitting: Lab

## 2012-01-26 ENCOUNTER — Ambulatory Visit (HOSPITAL_BASED_OUTPATIENT_CLINIC_OR_DEPARTMENT_OTHER): Payer: Medicaid Other | Admitting: Nurse Practitioner

## 2012-01-26 VITALS — BP 130/87 | HR 98 | Temp 96.7°F | Resp 18 | Ht 64.0 in | Wt 114.7 lb

## 2012-01-26 DIAGNOSIS — C20 Malignant neoplasm of rectum: Secondary | ICD-10-CM

## 2012-01-26 DIAGNOSIS — Z5111 Encounter for antineoplastic chemotherapy: Secondary | ICD-10-CM

## 2012-01-26 DIAGNOSIS — C7800 Secondary malignant neoplasm of unspecified lung: Secondary | ICD-10-CM

## 2012-01-26 LAB — COMPREHENSIVE METABOLIC PANEL (CC13)
AST: 14 U/L (ref 5–34)
Alkaline Phosphatase: 59 U/L (ref 40–150)
Glucose: 98 mg/dl (ref 70–99)
Sodium: 147 mEq/L — ABNORMAL HIGH (ref 136–145)
Total Bilirubin: 0.41 mg/dL (ref 0.20–1.20)
Total Protein: 7.8 g/dL (ref 6.4–8.3)

## 2012-01-26 LAB — CBC WITH DIFFERENTIAL/PLATELET
BASO%: 0.5 % (ref 0.0–2.0)
EOS%: 0.4 % (ref 0.0–7.0)
Eosinophils Absolute: 0 10*3/uL (ref 0.0–0.5)
LYMPH%: 16.5 % (ref 14.0–49.0)
MCH: 30.9 pg (ref 27.2–33.4)
MCHC: 33.5 g/dL (ref 32.0–36.0)
MCV: 92.2 fL (ref 79.3–98.0)
MONO%: 8.4 % (ref 0.0–14.0)
Platelets: 328 10*3/uL (ref 140–400)
RBC: 4.28 10*6/uL (ref 4.20–5.82)
RDW: 17.4 % — ABNORMAL HIGH (ref 11.0–14.6)

## 2012-01-26 MED ORDER — DEXAMETHASONE SODIUM PHOSPHATE 4 MG/ML IJ SOLN
20.0000 mg | Freq: Once | INTRAMUSCULAR | Status: AC
  Administered 2012-01-26: 20 mg via INTRAVENOUS

## 2012-01-26 MED ORDER — LEUCOVORIN CALCIUM INJECTION 350 MG
300.0000 mg | Freq: Once | INTRAVENOUS | Status: AC
  Administered 2012-01-26: 300 mg via INTRAVENOUS
  Filled 2012-01-26: qty 15

## 2012-01-26 MED ORDER — DIPHENOXYLATE-ATROPINE 2.5-0.025 MG PO TABS: 1.0000 | ORAL_TABLET | Freq: Four times a day (QID) | ORAL | Status: DC | PRN

## 2012-01-26 MED ORDER — SODIUM CHLORIDE 0.9 % IV SOLN
2400.0000 mg/m2 | INTRAVENOUS | Status: DC
  Administered 2012-01-26: 3750 mg via INTRAVENOUS
  Filled 2012-01-26: qty 75

## 2012-01-26 MED ORDER — ONDANSETRON 16 MG/50ML IVPB (CHCC)
16.0000 mg | Freq: Once | INTRAVENOUS | Status: AC
  Administered 2012-01-26: 16 mg via INTRAVENOUS

## 2012-01-26 MED ORDER — OXYCODONE-ACETAMINOPHEN 10-325 MG PO TABS: 1.0000 | ORAL_TABLET | Freq: Four times a day (QID) | ORAL | Status: DC | PRN

## 2012-01-26 MED ORDER — IRINOTECAN HCL CHEMO INJECTION 100 MG/5ML
125.0000 mg/m2 | Freq: Once | INTRAVENOUS | Status: AC
  Administered 2012-01-26: 196 mg via INTRAVENOUS
  Filled 2012-01-26: qty 9.8

## 2012-01-26 MED ORDER — DEXTROSE 5 % IV SOLN
Freq: Once | INTRAVENOUS | Status: AC
  Administered 2012-01-26: 12:00:00 via INTRAVENOUS

## 2012-01-26 MED ORDER — ATROPINE SULFATE 1 MG/ML IJ SOLN
0.5000 mg | Freq: Once | INTRAMUSCULAR | Status: AC | PRN
  Administered 2012-01-26: 0.5 mg via INTRAVENOUS

## 2012-01-26 MED ORDER — OXALIPLATIN CHEMO INJECTION 100 MG/20ML
85.0000 mg/m2 | Freq: Once | INTRAVENOUS | Status: AC
  Administered 2012-01-26: 135 mg via INTRAVENOUS
  Filled 2012-01-26: qty 27

## 2012-01-26 NOTE — Progress Notes (Signed)
OFFICE PROGRESS NOTE  Interval history:  Mr. Cuda returns as scheduled. He completed cycle 1 FOLFOXIRI on 01/12/2012. He had mild nausea. No vomiting. He denies mouth sores. He had diarrhea for 3 days following the chemotherapy. He took Imodium with some improvement. He notes recurrent diarrhea today. Back pain is better. He is taking approximately 3 Percocet tablets a day. One abdominal drain remains in place. He notes only a small amount of drainage daily. He denies abdominal pain.  He denies numbness or tingling in his hands or feet.   Objective: Blood pressure 130/87, pulse 98, temperature 96.7 F (35.9 C), temperature source Oral, resp. rate 18, height 5\' 4"  (1.626 m), weight 114 lb 11.2 oz (52.028 kg).  Oropharynx is without thrush or ulceration. Lungs are clear. Regular cardiac rhythm. Port-A-Cath site is without erythema. Abdomen is soft. No hepatomegaly. Extremities are without edema.  Lab Results: Lab Results  Component Value Date   WBC 7.9 01/04/2012   HGB 12.7* 01/04/2012   HCT 38.0* 01/04/2012   MCV 91.5 01/04/2012   PLT 322 01/04/2012    Chemistry:    Chemistry      Component Value Date/Time   NA 139 01/04/2012 0833   NA 136 12/02/2011 0600   K 3.7 01/04/2012 0833   K 4.1 12/02/2011 0600   CL 103 01/04/2012 0833   CL 100 12/02/2011 0600   CO2 23 01/04/2012 0833   CO2 29 12/02/2011 0600   BUN 9.0 01/04/2012 0833   BUN 9 12/02/2011 0600   CREATININE 0.8 01/04/2012 0833   CREATININE 0.64 12/02/2011 0600      Component Value Date/Time   CALCIUM 10.1 01/04/2012 0833   CALCIUM 8.3* 12/02/2011 0600   ALKPHOS 84 01/04/2012 0833   ALKPHOS 58 11/29/2011 0625   AST 22 01/04/2012 0833   AST 13 11/29/2011 0625   ALT 25 01/04/2012 0833   ALT 13 11/29/2011 0625   BILITOT 0.51 01/04/2012 0833   BILITOT 0.3 11/29/2011 0625       Studies/Results: Ir Sinus/fist Tube Chk-non Gi  01/07/2012  *RADIOLOGY REPORT*  Clinical Data: History of rectal cancer and multiple  intra- abdominal abscesses.  Evaluate remaining right lower quadrant abdominal drainage catheter  SINUS TRACT INJECTION/FISTULOGRAM  Comparison:  Sinus tract injection - 12/28/2011; abdominal CT - 12/13/2011; 11/28/2011  Technique:  A preprocedural spot radiograph was obtained.  Multiple spot radiographic and fluoroscopic images were obtained in various obliquities during the injection of contrast via the existing right lower quadrant abscess drainage catheter.  After review of the images, the catheter was flushed with saline and connected to a drainage bag.  A dressing was placed.  The patient tolerated procedure well without immediate postprocedural complication.  Findings:  Contrast injection of the existing right lower quadrant abdominal drainage catheter demonstrates persistent fistulous communication to an adjacent loop of likely small bowel within the midline of the pelvis.  There are potentially two fistulous connections adjacent to the pig tail coil of the drainage catheter.  As such, the drainage catheter was not removed.  The drainage catheter was flushed with saline and reconnected to a drainage bag.  IMPRESSION: Persistent fistulous connection between the remaining right lower abdominal drainage catheter and an adjacent loop of bowel (possibly small bowel) within the midline of the lower pelvis.  As such, the right lower quadrant catheter was left in place and reconnected to a drainage bag.   Original Report Authenticated By: Tacey Ruiz, MD    Ir Sinus/fist Tube Chk-non  Gi  12/28/2011  *RADIOLOGY REPORT*  Clinical history:History of rectal cancer and abdominal abscesses. The patient currently has three percutaneous drainage catheters. Evaluate drains for possible removal.  The patient reports that there has been minimal drainage from all the catheters.  PROCEDURE(S): INJECTION OF EXISTING DRAINAGE CATHETERS X 3  Physician: Rachelle Hora. Henn, MD  Medications:None  Moderate sedation time:None   Fluoroscopy time: 2.1 minutes  Procedure:The patient was placed supine on the interventional table.  Fluoroscopic images of the abdomen were obtained.  All three of the catheters were injected with fluoroscopy.  The catheter in the left upper quadrant and left lower quadrant were cut and removed.  Bandages were placed over the old drain sites. The right lower quadrant drain was flushed with sterile saline and attached to a gravity bag.  Findings:Right lower quadrant drain:  There was filling of a bowel structure in the right lower quadrant which is suggestive for colon.  There is no significant collection around the pigtail catheter.  Left lower quadrant drain: There is a small tubular shaped collection above the catheter.  There is no evidence for a bowel fistula.  The entire injected fluid was easily aspirated.  Left upper quadrant drain:  A small collection associated with the pigtail catheter and no evidence for bowel fistula.  Small amount of contrast just below the left hemidiaphragm.  Complications: None  Impression:There is a fistula connection between the right lower quadrant drain and the colon in the right lower quadrant.  The right lower quadrant catheter was left in place and attached to a gravity bag.  The patient was instructed to no longer flush the catheter.  Small collections associated with the left upper quadrant and left lower quadrant drains.  These catheters were successfully removed.   Original Report Authenticated By: Richarda Overlie, M.D.     Medications: I have reviewed the patient's current medications.  Assessment/Plan:  1. Clinical stage III (uT3 uN1) rectal cancer status post pelvic radiation, August 24 through November 18, 2009. He underwent a laparoscopic-assisted low anterior resection with diverting loop ileostomy, January 14, 2010, with the final pathology showing invasive adenocarcinoma focally extending into the perirectal connective tissue, negative margins, extensive  ulceration with perforation and perirectal abscess. There was no lymph node involvement (pT3 pN0). He began adjuvant CAPOX chemotherapy March 11, 2010. He completed the sixth and final cycle of CAPOX 06/25/2010. CT of the abdomen and pelvis 03/11/2011 confirmed multiple nodules at the lung bases consistent with metastatic disease. 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 He completed cycle 1 of FOLFIRI/Avastin 04/29/2011. He completed cycle 3 05/27/2011. CEA was improved on 06/10/2011. He completed cycle 4 on 06/10/2011. He completed cycle 5 on 06/24/2011. He completed cycle 6 on 07/08/2011. Restaging CT evaluation 07/22/2011 showed a slight decrease in pulmonary nodules. He completed cycle 8 FOLFIRI/Avastin on 08/12/2011. He completed cycle 9 on 09/02/2011. Chest CT on 10/07/2011 showed increased size and number of lung nodules. CT scans 12/13/2011 with mild progression of pulmonary metastatic disease; interval resolution of left upper quadrant and central pelvic fluid collections; residual gas and fluid collection in the undersurface of the left lower quadrant ventral abdominal wall; similar appearance of soft tissue thickening and enhancement to the perirectal region; abnormal small bowel dilatation and moderate colonic stool burden consistent with ileus and/or constipation. He completed cycle 1 FOLFOXIRI on 01/12/2012. 2. Strangulated peristomal hernia and small bowel obstruction status post reduction of peristomal hernia, omentectomy, exploratory laparotomy,  small bowel resection x2 on January 28, 2010.  3. History of rectal bleeding secondary to number 1.  4. History of an elevated CEA. Improved on 06/10/2011 and 07/29/2011. 5. History of a left adrenal nodule.  6. Weight loss. 7. Right knee pain/effusion status post evaluation at [facility].  8. CT abdomen/pelvis 03/11/2011 with multiple nodules at the lung bases  consistent with metastatic involvement of the lungs; prominent presacral soft tissue; air bubble within the presacral soft tissue appeared to be extraluminal and breakdown at the anastomotic site could not be excluded. No discrete abscess was seen. 9. Elevated blood pressure. Question effect of Avastin. He was started on Norvasc on 06/10/2011.  10. Frequent bowel movements. Likely related to multiple bowel resections. Imodium was not effective. 11. Suture at the ileostomy reversal scar. Suture removed during the recent hospitalization. 12. Erectile dysfunction. The testosterone level returned at 508 on 08/12/2011, he was referred to urology, but Medicaid would not pay for this visit 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 Culture returned positive for abundant Escherichia coli. He completed a course of Augmentin.  14. Status post diverting loop descending colostomy 10/05/2011. 15. Status post multiple rectal biopsies 10/05/2011, all negative for malignancy. 16. Hospitalization 10/13/2011 with a bowel obstruction. Resolved. 17. Hospitalization 11/07/2011 with a recurrent small bowel obstruction. He was taken to the OR on 11/10/2011 and underwent an exploratory laparotomy with small bowel resection x2 and incidental appendectomy. He was found to have a closed loop obstruction. Pathology on #1 small bowel resection showed focal perforation with no evidence of malignancy; pathology on #2 small bowel resection showed metastatic adenocarcinoma consistent with colorectal primary; pathology on the appendix showed serosal fibrous adhesions with no evidence of malignancy.He developed postoperative respiratory failure, sepsis and renal insufficiency. He improved gradually and was able to be extubated. He required placement of multiple percutaneous drains for intraperitoneal abscesses. He was discharged home on 12/02/2011. 18. Deconditioning. Improved. 19. Low back and  testicular pain when supine, tenderness of the right testicle. Improved. 20. Diarrhea following cycle 1 FOLFOXIRI. He has Imodium to take as needed. He was also given a prescription for Lomotil. He knows to contact the office if he has diarrhea despite these measures.  Disposition-Jeffery [name] appears stable. Plan to proceed with cycle 2 FOLFOXIRI today as scheduled. He will return for a followup visit and cycle 3 on 02/10/2012. He will contact the office in the interim as outlined above or with any other problems.  Plan reviewed with Dr. Truett Perna.  Lonna Cobb ANP/GNP-BC

## 2012-01-26 NOTE — Patient Instructions (Addendum)
[facility] Discharge Instructions for Patients Receiving Chemotherapy  Today you received the following chemotherapy agents Oxaliplatin, Camptosar, Leucovorin, 5FU.  To help prevent nausea and vomiting after your treatment, we encourage you to take your nausea medication as prescribed. Begin taking it as directed and take it as often as prescribed for the next 48-72  Hours after pump d/c.   If you develop nausea and vomiting that is not controlled by your nausea medication, call the clinic. If it is after clinic hours your family physician or the after hours number for the clinic or go to the Emergency Department.   BELOW ARE SYMPTOMS THAT SHOULD BE REPORTED IMMEDIATELY:  *FEVER GREATER THAN 100.5 F  *CHILLS WITH OR WITHOUT FEVER  NAUSEA AND VOMITING THAT IS NOT CONTROLLED WITH YOUR NAUSEA MEDICATION  *UNUSUAL SHORTNESS OF BREATH  *UNUSUAL BRUISING OR BLEEDING  TENDERNESS IN MOUTH AND THROAT WITH OR WITHOUT PRESENCE OF ULCERS  *URINARY PROBLEMS  *BOWEL PROBLEMS  UNUSUAL RASH Items with * indicate a potential emergency and should be followed up as soon as possible.  One of the nurses will contact you 24 hours after your treatment. Please let the nurse know about any problems that you may have experienced. Feel free to call the clinic you have any questions or concerns. The clinic phone number is [phone].   I have been informed and understand all the instructions given to me. I know to contact the clinic, my physician, or go to the Emergency Department if any problems should occur. I do not have any questions at this time, but understand that I may call the clinic during office hours   should I have any questions or need assistance in obtaining follow up care.    __________________________________________  _____________  __________ Signature of Patient or Authorized Representative            Date                    Time    __________________________________________ Nurse's Signature

## 2012-01-26 NOTE — Telephone Encounter (Signed)
gv and pritned appt schedule to pt for Dec...emailed michelle to add chemo...the patient aware

## 2012-01-27 ENCOUNTER — Telehealth: Payer: Self-pay | Admitting: Oncology

## 2012-01-27 NOTE — Telephone Encounter (Signed)
s.w. pt and advised on 12.24.13 and 12.26.13 appt...Marland KitchenMarland KitchenMarland Kitchenpt aware...lab/est/infusion

## 2012-01-28 ENCOUNTER — Ambulatory Visit (HOSPITAL_BASED_OUTPATIENT_CLINIC_OR_DEPARTMENT_OTHER): Payer: Medicaid Other

## 2012-01-28 VITALS — BP 131/81 | HR 72 | Temp 98.2°F

## 2012-01-28 DIAGNOSIS — C20 Malignant neoplasm of rectum: Secondary | ICD-10-CM

## 2012-01-28 MED ORDER — HEPARIN SOD (PORK) LOCK FLUSH 100 UNIT/ML IV SOLN
500.0000 [IU] | Freq: Once | INTRAVENOUS | Status: AC | PRN
  Administered 2012-01-28: 500 [IU]
  Filled 2012-01-28: qty 5

## 2012-01-28 MED ORDER — SODIUM CHLORIDE 0.9 % IJ SOLN
10.0000 mL | INTRAMUSCULAR | Status: DC | PRN
  Administered 2012-01-28: 10 mL
  Filled 2012-01-28: qty 10

## 2012-01-28 NOTE — Patient Instructions (Signed)
Call MD for problems 

## 2012-02-03 ENCOUNTER — Telehealth: Payer: Self-pay

## 2012-02-03 NOTE — Telephone Encounter (Signed)
This does not appear to be a chronic med for him prescribed by me for this pt in the past.  I would suggest a refill request for the MD from whom he obtained the original rx.

## 2012-02-03 NOTE — Telephone Encounter (Signed)
Please advise on Lyrica 50 mg bid prn refill as not on current list.  Pharmacy [facility] [address] [place]

## 2012-02-04 NOTE — Telephone Encounter (Signed)
Patient informed. 

## 2012-02-08 ENCOUNTER — Ambulatory Visit (HOSPITAL_BASED_OUTPATIENT_CLINIC_OR_DEPARTMENT_OTHER): Payer: Medicaid Other | Admitting: Nurse Practitioner

## 2012-02-08 ENCOUNTER — Other Ambulatory Visit (HOSPITAL_BASED_OUTPATIENT_CLINIC_OR_DEPARTMENT_OTHER): Payer: Medicaid Other | Admitting: Lab

## 2012-02-08 VITALS — BP 165/105 | HR 107 | Temp 97.1°F | Resp 18 | Ht 64.0 in | Wt 122.4 lb

## 2012-02-08 DIAGNOSIS — C20 Malignant neoplasm of rectum: Secondary | ICD-10-CM

## 2012-02-08 DIAGNOSIS — C7800 Secondary malignant neoplasm of unspecified lung: Secondary | ICD-10-CM

## 2012-02-08 DIAGNOSIS — R634 Abnormal weight loss: Secondary | ICD-10-CM

## 2012-02-08 LAB — COMPREHENSIVE METABOLIC PANEL (CC13)
ALT: 20 U/L (ref 0–55)
BUN: 10 mg/dL (ref 7.0–26.0)
CO2: 26 mEq/L (ref 22–29)
Calcium: 9.4 mg/dL (ref 8.4–10.4)
Chloride: 104 mEq/L (ref 98–107)
Creatinine: 0.8 mg/dL (ref 0.7–1.3)
Glucose: 96 mg/dl (ref 70–99)
Total Bilirubin: 0.4 mg/dL (ref 0.20–1.20)

## 2012-02-08 LAB — CBC WITH DIFFERENTIAL/PLATELET
BASO%: 0.4 % (ref 0.0–2.0)
Basophils Absolute: 0 10*3/uL (ref 0.0–0.1)
Eosinophils Absolute: 0 10*3/uL (ref 0.0–0.5)
HCT: 36.3 % — ABNORMAL LOW (ref 38.4–49.9)
HGB: 12 g/dL — ABNORMAL LOW (ref 13.0–17.1)
LYMPH%: 13.9 % — ABNORMAL LOW (ref 14.0–49.0)
MCHC: 33.1 g/dL (ref 32.0–36.0)
MONO#: 0.7 10*3/uL (ref 0.1–0.9)
NEUT%: 76.5 % — ABNORMAL HIGH (ref 39.0–75.0)
Platelets: 273 10*3/uL (ref 140–400)
WBC: 7.6 10*3/uL (ref 4.0–10.3)
lymph#: 1.1 10*3/uL (ref 0.9–3.3)

## 2012-02-08 MED ORDER — OXYCODONE-ACETAMINOPHEN 10-325 MG PO TABS: 1.0000 | ORAL_TABLET | Freq: Four times a day (QID) | ORAL | Status: DC | PRN

## 2012-02-08 NOTE — Progress Notes (Signed)
OFFICE PROGRESS NOTE  Interval history:  Mr. Frasier returns as scheduled. He completed cycle 2 FOLFOXIRI on 01/26/2012. He denies nausea/vomiting. No mouth sores. No diarrhea. He had cold sensitivity lasting approximately 3 days. He denies persistent neuropathy symptoms. He continues to have one abdominal drain. He has abdominal pain that he associates with the drain. He is taking Percocet as needed. He denies fever. No chills. No cough or shortness of breath. He denies bleeding. Appetite is improving. He is gaining weight.   Objective: Blood pressure 165/105, pulse 107, temperature 97.1 F (36.2 C), temperature source Oral, resp. rate 18, height 5\' 4"  (1.626 m), weight 122 lb 6.4 oz (55.52 kg).  Oropharynx is without thrush or ulceration. Lungs are clear. Regular cardiac rhythm. Port-A-Cath site is without erythema. Abdomen is soft. No hepatomegaly. Soft stool in the colostomy collection bag. Extremities are without edema. Vibratory sense is mildly decreased over the fingertips per tuning fork exam.  Lab Results: Lab Results  Component Value Date   WBC 7.6 02/08/2012   HGB 12.0* 02/08/2012   HCT 36.3* 02/08/2012   MCV 93.5 02/08/2012   PLT 273 02/08/2012    Chemistry:    Chemistry      Component Value Date/Time   NA 140 02/08/2012 1018   NA 136 12/02/2011 0600   K 3.7 02/08/2012 1018   K 4.1 12/02/2011 0600   CL 104 02/08/2012 1018   CL 100 12/02/2011 0600   CO2 26 02/08/2012 1018   CO2 29 12/02/2011 0600   BUN 10.0 02/08/2012 1018   BUN 9 12/02/2011 0600   CREATININE 0.8 02/08/2012 1018   CREATININE 0.64 12/02/2011 0600      Component Value Date/Time   CALCIUM 9.4 02/08/2012 1018   CALCIUM 8.3* 12/02/2011 0600   ALKPHOS 62 02/08/2012 1018   ALKPHOS 58 11/29/2011 0625   AST 17 02/08/2012 1018   AST 13 11/29/2011 0625   ALT 20 02/08/2012 1018   ALT 13 11/29/2011 0625   BILITOT 0.40 02/08/2012 1018   BILITOT 0.3 11/29/2011 0625       Studies/Results: No results  found.  Medications: I have reviewed the patient's current medications.  Assessment/Plan:  1. Clinical stage III (uT3 uN1) rectal cancer status post pelvic radiation, August 24 through November 18, 2009. 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 There was no lymph node involvement (pT3 pN0). He began adjuvant CAPOX chemotherapy March 11, 2010. He completed the sixth and final cycle of CAPOX 06/25/2010. CT of the abdomen and pelvis 03/11/2011 confirmed multiple nodules at the lung bases consistent with metastatic disease. 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 He completed cycle 1 of FOLFIRI/Avastin 04/29/2011. He completed cycle 3 05/27/2011. CEA was improved on 06/10/2011. He completed cycle 4 on 06/10/2011. He completed cycle 5 on 06/24/2011. He completed cycle 6 on 07/08/2011. Restaging CT evaluation 07/22/2011 showed a slight decrease in pulmonary nodules. He completed cycle 8 FOLFIRI/Avastin on 08/12/2011. He completed cycle 9 on 09/02/2011. Chest CT on 10/07/2011 showed increased size and number of lung nodules. CT scans 12/13/2011 with mild progression of pulmonary metastatic disease; interval resolution of left upper quadrant and central pelvic fluid collections; residual gas and fluid collection in the undersurface of the left lower quadrant ventral abdominal wall; similar appearance of soft  tissue thickening and enhancement to the perirectal region; abnormal small bowel dilatation and moderate colonic stool burden consistent with ileus and/or constipation. He completed cycle 1 FOLFOXIRI on 01/12/2012. He completed cycle 2 on 01/26/2012. 2. Strangulated peristomal hernia and  small bowel obstruction status post reduction of peristomal hernia, omentectomy, exploratory laparotomy, small bowel resection x2 on January 28, 2010.  3. History of rectal bleeding secondary to number 1.  4. History of an elevated CEA. Improved on 06/10/2011 and 07/29/2011. 5. History of a left adrenal nodule.  6. Weight loss. 7. Right knee pain/effusion status post evaluation at [facility].  8. CT abdomen/pelvis 03/11/2011 with multiple nodules at the lung bases consistent with metastatic involvement of the lungs; prominent presacral soft tissue; air bubble within the presacral soft tissue appeared to be extraluminal and breakdown at the anastomotic site could not be excluded. No discrete abscess was seen. 9. Elevated blood pressure. Question effect of Avastin. He was started on Norvasc on 06/10/2011.  10. Frequent bowel movements. Likely related to multiple bowel resections. Imodium was not effective. 11. Suture at the ileostomy reversal scar. Suture removed during the recent hospitalization. 12. Erectile dysfunction. The testosterone level returned at 508 on 08/12/2011, he was referred to urology, but Medicaid would not pay for this visit 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 Culture returned positive for abundant Escherichia coli. He completed a course of Augmentin.  14. Status post diverting loop descending colostomy 10/05/2011. 15. Status post multiple rectal biopsies 10/05/2011, all negative for malignancy. 16. Hospitalization 10/13/2011 with a bowel obstruction. Resolved. 17. Hospitalization 11/07/2011 with a recurrent small bowel obstruction. He was taken to the OR on 11/10/2011 and underwent an exploratory laparotomy with small bowel resection x2 and incidental appendectomy. He was found to have a closed loop obstruction. Pathology on #1 small bowel resection showed focal perforation with no evidence of malignancy; pathology on #2  small bowel resection showed metastatic adenocarcinoma consistent with colorectal primary; pathology on the appendix showed serosal fibrous adhesions with no evidence of malignancy.He developed postoperative respiratory failure, sepsis and renal insufficiency. He improved gradually and was able to be extubated. He required placement of multiple percutaneous drains for intraperitoneal abscesses. He was discharged home on 12/02/2011. He has one drain remaining. 18. Deconditioning. Improved. 19. Low back and testicular pain when supine, tenderness of the right testicle. Improved. 20. Diarrhea following cycle 1 FOLFOXIRI. He did not have diarrhea following cycle 2.  Disposition-Mr. Grivas appears stable. Plan to proceed with cycle 3 FOLFOXIRI as scheduled on 02/10/2012. He will return for a followup visit and cycle 4 on 02/24/2012. We will obtain a repeat CEA on 02/24/2012. Restaging CT evaluation is planned following completion of 4 cycles. He will contact the office prior to his next visit with any problems.  Plan reviewed with Dr. Truett Perna.  Lonna Cobb ANP/GNP-BC

## 2012-02-09 ENCOUNTER — Other Ambulatory Visit: Payer: Self-pay | Admitting: Oncology

## 2012-02-10 ENCOUNTER — Telehealth: Payer: Self-pay | Admitting: Oncology

## 2012-02-10 ENCOUNTER — Other Ambulatory Visit: Payer: Medicaid Other | Admitting: Lab

## 2012-02-10 ENCOUNTER — Telehealth: Payer: Self-pay | Admitting: *Deleted

## 2012-02-10 ENCOUNTER — Ambulatory Visit (HOSPITAL_BASED_OUTPATIENT_CLINIC_OR_DEPARTMENT_OTHER): Payer: Medicaid Other

## 2012-02-10 ENCOUNTER — Ambulatory Visit: Payer: Medicaid Other | Admitting: Oncology

## 2012-02-10 VITALS — BP 123/82 | HR 111 | Temp 98.0°F | Resp 18

## 2012-02-10 DIAGNOSIS — C7800 Secondary malignant neoplasm of unspecified lung: Secondary | ICD-10-CM

## 2012-02-10 DIAGNOSIS — C20 Malignant neoplasm of rectum: Secondary | ICD-10-CM

## 2012-02-10 DIAGNOSIS — Z5111 Encounter for antineoplastic chemotherapy: Secondary | ICD-10-CM

## 2012-02-10 MED ORDER — IRINOTECAN HCL CHEMO INJECTION 100 MG/5ML
125.0000 mg/m2 | Freq: Once | INTRAVENOUS | Status: AC
  Administered 2012-02-10: 196 mg via INTRAVENOUS
  Filled 2012-02-10: qty 9.8

## 2012-02-10 MED ORDER — SODIUM CHLORIDE 0.9 % IV SOLN
2400.0000 mg/m2 | INTRAVENOUS | Status: DC
  Administered 2012-02-10: 3750 mg via INTRAVENOUS
  Filled 2012-02-10: qty 75

## 2012-02-10 MED ORDER — ONDANSETRON 16 MG/50ML IVPB (CHCC)
16.0000 mg | Freq: Once | INTRAVENOUS | Status: AC
  Administered 2012-02-10: 16 mg via INTRAVENOUS

## 2012-02-10 MED ORDER — DEXTROSE 5 % IV SOLN
Freq: Once | INTRAVENOUS | Status: AC
  Administered 2012-02-10: 12:00:00 via INTRAVENOUS

## 2012-02-10 MED ORDER — DEXTROSE 5 % IV SOLN
85.0000 mg/m2 | Freq: Once | INTRAVENOUS | Status: AC
  Administered 2012-02-10: 135 mg via INTRAVENOUS
  Filled 2012-02-10: qty 27

## 2012-02-10 MED ORDER — LEUCOVORIN CALCIUM INJECTION 350 MG
200.0000 mg/m2 | Freq: Once | INTRAVENOUS | Status: AC
  Administered 2012-02-10: 312 mg via INTRAVENOUS
  Filled 2012-02-10: qty 15.6

## 2012-02-10 MED ORDER — ATROPINE SULFATE 1 MG/ML IJ SOLN
0.5000 mg | Freq: Once | INTRAMUSCULAR | Status: AC | PRN
  Administered 2012-02-10: 15:00:00 via INTRAVENOUS

## 2012-02-10 MED ORDER — DEXAMETHASONE SODIUM PHOSPHATE 4 MG/ML IJ SOLN
20.0000 mg | Freq: Once | INTRAMUSCULAR | Status: AC
  Administered 2012-02-10: 20 mg via INTRAVENOUS

## 2012-02-10 NOTE — Patient Instructions (Addendum)
[facility] Discharge Instructions for Patients Receiving Chemotherapy  Today you received the following chemotherapy agents Oxaliplatin, Irinotecan, Leucovorin and Adrucil.  To help prevent nausea and vomiting after your treatment, we encourage you to take your nausea medication. Begin taking your nausea medication as often as prescribed for by Dr. Truett Perna.    If you develop nausea and vomiting that is not controlled by your nausea medication, call the clinic. If it is after clinic hours your family physician or the after hours number for the clinic or go to the Emergency Department.   BELOW ARE SYMPTOMS THAT SHOULD BE REPORTED IMMEDIATELY:  *FEVER GREATER THAN 100.5 F  *CHILLS WITH OR WITHOUT FEVER  NAUSEA AND VOMITING THAT IS NOT CONTROLLED WITH YOUR NAUSEA MEDICATION  *UNUSUAL SHORTNESS OF BREATH  *UNUSUAL BRUISING OR BLEEDING  TENDERNESS IN MOUTH AND THROAT WITH OR WITHOUT PRESENCE OF ULCERS  *URINARY PROBLEMS  *BOWEL PROBLEMS  UNUSUAL RASH Items with * indicate a potential emergency and should be followed up as soon as possible.  One of the nurses will contact you 24 hours after your treatment. Please let the nurse know about any problems that you may have experienced. Feel free to call the clinic you have any questions or concerns. The clinic phone number is [phone].   I have been informed and understand all the instructions given to me. I know to contact the clinic, my physician, or go to the Emergency Department if any problems should occur. I do not have any questions at this time, but understand that I may call the clinic during office hours   should I have any questions or need assistance in obtaining follow up care.    __________________________________________  _____________  __________ Signature of Patient or Authorized Representative            Date                   Time    __________________________________________ Nurse's  Signature

## 2012-02-10 NOTE — Telephone Encounter (Signed)
Talked to pt's wife left message to get new calendar on 02/12/12

## 2012-02-10 NOTE — Telephone Encounter (Signed)
Per staff message and POF I have scheduled apapts. JMW

## 2012-02-12 ENCOUNTER — Ambulatory Visit (HOSPITAL_BASED_OUTPATIENT_CLINIC_OR_DEPARTMENT_OTHER): Payer: Medicaid Other

## 2012-02-12 VITALS — BP 154/98 | Temp 98.2°F

## 2012-02-12 DIAGNOSIS — C7800 Secondary malignant neoplasm of unspecified lung: Secondary | ICD-10-CM

## 2012-02-12 DIAGNOSIS — C20 Malignant neoplasm of rectum: Secondary | ICD-10-CM

## 2012-02-12 DIAGNOSIS — Z452 Encounter for adjustment and management of vascular access device: Secondary | ICD-10-CM

## 2012-02-12 MED ORDER — SODIUM CHLORIDE 0.9 % IJ SOLN
10.0000 mL | INTRAMUSCULAR | Status: DC | PRN
  Administered 2012-02-12: 10 mL
  Filled 2012-02-12: qty 10

## 2012-02-12 MED ORDER — HEPARIN SOD (PORK) LOCK FLUSH 100 UNIT/ML IV SOLN
500.0000 [IU] | Freq: Once | INTRAVENOUS | Status: AC | PRN
  Administered 2012-02-12: 500 [IU]
  Filled 2012-02-12: qty 5

## 2012-02-14 ENCOUNTER — Other Ambulatory Visit: Payer: Self-pay | Admitting: Certified Registered Nurse Anesthetist

## 2012-02-15 ENCOUNTER — Other Ambulatory Visit: Payer: Self-pay | Admitting: Oncology

## 2012-02-15 ENCOUNTER — Encounter: Payer: Self-pay | Admitting: Oncology

## 2012-02-15 NOTE — Progress Notes (Signed)
Patient came in and said was at [facility] and they were waiting on call from [place]? Note from [place] she had already spoke with them and they are to send invoice to [place], so I can get submitted for payment from his funds. Lynden Ang is going to fax invoice to me-I gave her fax number. I advised her that he is on his way to pick up now.

## 2012-02-23 ENCOUNTER — Other Ambulatory Visit: Payer: Self-pay | Admitting: Oncology

## 2012-02-24 ENCOUNTER — Other Ambulatory Visit (HOSPITAL_BASED_OUTPATIENT_CLINIC_OR_DEPARTMENT_OTHER): Payer: Medicaid Other | Admitting: Lab

## 2012-02-24 ENCOUNTER — Ambulatory Visit (HOSPITAL_BASED_OUTPATIENT_CLINIC_OR_DEPARTMENT_OTHER): Payer: Medicaid Other | Admitting: Nurse Practitioner

## 2012-02-24 ENCOUNTER — Telehealth: Payer: Self-pay | Admitting: *Deleted

## 2012-02-24 ENCOUNTER — Telehealth: Payer: Self-pay | Admitting: Oncology

## 2012-02-24 ENCOUNTER — Ambulatory Visit: Payer: Medicaid Other

## 2012-02-24 VITALS — BP 129/86 | HR 109 | Temp 98.5°F | Resp 18 | Ht 64.0 in | Wt 121.4 lb

## 2012-02-24 DIAGNOSIS — C7800 Secondary malignant neoplasm of unspecified lung: Secondary | ICD-10-CM

## 2012-02-24 DIAGNOSIS — C20 Malignant neoplasm of rectum: Secondary | ICD-10-CM

## 2012-02-24 DIAGNOSIS — N509 Disorder of male genital organs, unspecified: Secondary | ICD-10-CM

## 2012-02-24 DIAGNOSIS — M545 Low back pain, unspecified: Secondary | ICD-10-CM

## 2012-02-24 LAB — CBC WITH DIFFERENTIAL/PLATELET
BASO%: 0.3 % (ref 0.0–2.0)
EOS%: 0.7 % (ref 0.0–7.0)
Eosinophils Absolute: 0 10*3/uL (ref 0.0–0.5)
LYMPH%: 12.8 % — ABNORMAL LOW (ref 14.0–49.0)
MCHC: 33.4 g/dL (ref 32.0–36.0)
MCV: 94.7 fL (ref 79.3–98.0)
MONO%: 12.1 % (ref 0.0–14.0)
NEUT#: 4 10*3/uL (ref 1.5–6.5)
Platelets: 259 10*3/uL (ref 140–400)
RBC: 3.93 10*6/uL — ABNORMAL LOW (ref 4.20–5.82)
RDW: 18 % — ABNORMAL HIGH (ref 11.0–14.6)

## 2012-02-24 LAB — COMPREHENSIVE METABOLIC PANEL (CC13)
ALT: 16 U/L (ref 0–55)
AST: 21 U/L (ref 5–34)
Alkaline Phosphatase: 60 U/L (ref 40–150)
CO2: 32 mEq/L — ABNORMAL HIGH (ref 22–29)
Creatinine: 0.9 mg/dL (ref 0.7–1.3)
Sodium: 141 mEq/L (ref 136–145)
Total Bilirubin: 0.54 mg/dL (ref 0.20–1.20)
Total Protein: 7.7 g/dL (ref 6.4–8.3)

## 2012-02-24 LAB — CEA: CEA: 14.7 ng/mL — ABNORMAL HIGH (ref 0.0–5.0)

## 2012-02-24 MED ORDER — OXYCODONE-ACETAMINOPHEN 10-325 MG PO TABS: 1.0000 | ORAL_TABLET | Freq: Four times a day (QID) | ORAL | Status: DC | PRN

## 2012-02-24 MED ORDER — POTASSIUM CHLORIDE CRYS ER 20 MEQ PO TBCR: EXTENDED_RELEASE_TABLET | ORAL | Status: DC

## 2012-02-24 NOTE — Telephone Encounter (Signed)
Called pt with instructions to take Potassium 1 tablet twice daily for 3 days then one daily. He voiced understanding and stated he will pick up med today.

## 2012-02-24 NOTE — Progress Notes (Signed)
OFFICE PROGRESS NOTE  Interval history:  Mr. Latona returns as scheduled. He completed cycle 2 FOLFOXIRI on 02/10/2012. He denies nausea/vomiting. No mouth sores. No diarrhea. He notes cold sensitivity lasting approximately 2 days. He denies persistent neuropathy symptoms. He has a good appetite. He reports his weight is stable. He continues to have pain associated with the pelvic drain. He is taking Percocet as needed. Overall he reports he is feeling well.   Objective: Blood pressure 129/86, pulse 109, temperature 98.5 F (36.9 C), temperature source Oral, resp. rate 18, height 5\' 4"  (1.626 m), weight 121 lb 6.4 oz (55.067 kg).  Oropharynx is without thrush or ulceration. Lungs are clear. Regular cardiac rhythm. Port-A-Cath site is without erythema. Abdomen is soft and nontender. No hepatomegaly. He has a colostomy. Midline incision is healed. Extremities are without edema. Vibratory sense intact over the fingertips per tuning fork exam. Transverse scar at the right abdomen with surrounding firmness.  Lab Results: Lab Results  Component Value Date   WBC 5.4 02/24/2012   HGB 12.4* 02/24/2012   HCT 37.2* 02/24/2012   MCV 94.7 02/24/2012   PLT 259 02/24/2012    Chemistry:    Chemistry      Component Value Date/Time   NA 140 02/08/2012 1018   NA 136 12/02/2011 0600   K 3.7 02/08/2012 1018   K 4.1 12/02/2011 0600   CL 104 02/08/2012 1018   CL 100 12/02/2011 0600   CO2 26 02/08/2012 1018   CO2 29 12/02/2011 0600   BUN 10.0 02/08/2012 1018   BUN 9 12/02/2011 0600   CREATININE 0.8 02/08/2012 1018   CREATININE 0.64 12/02/2011 0600      Component Value Date/Time   CALCIUM 9.4 02/08/2012 1018   CALCIUM 8.3* 12/02/2011 0600   ALKPHOS 62 02/08/2012 1018   ALKPHOS 58 11/29/2011 0625   AST 17 02/08/2012 1018   AST 13 11/29/2011 0625   ALT 20 02/08/2012 1018   ALT 13 11/29/2011 0625   BILITOT 0.40 02/08/2012 1018   BILITOT 0.3 11/29/2011 0625       Studies/Results: No results  found.  Medications: I have reviewed the patient's current medications.  Assessment/Plan:  1. Clinical stage III (uT3 uN1) rectal cancer status post pelvic radiation, August 24 through November 18, 2009. 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 There was no lymph node involvement (pT3 pN0). He began adjuvant CAPOX chemotherapy March 11, 2010. He completed the sixth and final cycle of CAPOX 06/25/2010. CT of the abdomen and pelvis 03/11/2011 confirmed multiple nodules at the lung bases consistent with metastatic disease. 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 He completed cycle 1 of FOLFIRI/Avastin 04/29/2011. He completed cycle 3 05/27/2011. CEA was improved on 06/10/2011. He completed cycle 4 on 06/10/2011. He completed cycle 5 on 06/24/2011. He completed cycle 6 on 07/08/2011. Restaging CT evaluation 07/22/2011 showed a slight decrease in pulmonary nodules. He completed cycle 8 FOLFIRI/Avastin on 08/12/2011. He completed cycle 9 on 09/02/2011. Chest CT on 10/07/2011 showed increased size and number of lung nodules. CT scans 12/13/2011 with mild progression of pulmonary metastatic disease; interval resolution of left upper quadrant and central pelvic fluid collections; residual gas and fluid collection in the undersurface of the left lower quadrant ventral abdominal wall; similar appearance of soft  tissue thickening and enhancement to the perirectal region; abnormal small bowel dilatation and moderate colonic stool burden consistent with ileus and/or constipation. He completed cycle 1 FOLFOXIRI on 01/12/2012. He completed cycle 2 on 01/26/2012. He completed cycle 3  02/10/2012. 2. Strangulated peristomal hernia and small bowel obstruction status post reduction of peristomal hernia, omentectomy, exploratory laparotomy, small bowel resection x2 on January 28, 2010.  3. History of rectal bleeding secondary to number 1.  4. History of an elevated CEA. Improved on 06/10/2011 and 07/29/2011. 5. History of a left adrenal nodule.  6. Weight loss. Improved. 7. Right knee pain/effusion status post evaluation at [facility].  8. CT abdomen/pelvis 03/11/2011 with multiple nodules at the lung bases consistent with metastatic involvement of the lungs; prominent presacral soft tissue; air bubble within the presacral soft tissue appeared to be extraluminal and breakdown at the anastomotic site could not be excluded. No discrete abscess was seen. 9. Elevated blood pressure. Question effect of Avastin. He was started on Norvasc on 06/10/2011.  10. Frequent bowel movements. Likely related to multiple bowel resections. Imodium was not effective. 11. Suture at the ileostomy reversal scar. Suture removed during the recent hospitalization. 12. Erectile dysfunction. The testosterone level returned at 508 on 08/12/2011, he was referred to urology, but Medicaid would not pay for this visit. 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 Culture returned positive for abundant Escherichia coli. He completed a course of Augmentin.  14. Status post diverting loop descending colostomy 10/05/2011. 15. Status post multiple rectal biopsies 10/05/2011, all negative for malignancy. 16. Hospitalization 10/13/2011 with a bowel obstruction. Resolved. 17. Hospitalization 11/07/2011 with a recurrent small bowel obstruction. He was taken to the OR on 11/10/2011 and underwent an exploratory laparotomy with small bowel resection x2 and incidental appendectomy. He was found to have a closed loop obstruction. Pathology on #1 small bowel resection showed focal  perforation with no evidence of malignancy; pathology on #2 small bowel resection showed metastatic adenocarcinoma consistent with colorectal primary; pathology on the appendix showed serosal fibrous adhesions with no evidence of malignancy.He developed postoperative respiratory failure, sepsis and renal insufficiency. He improved gradually and was able to be extubated. He required placement of multiple percutaneous drains for intraperitoneal abscesses. He was discharged home on 12/02/2011. He has one drain remaining. 18. Deconditioning. Improved. 19. Low back and testicular pain when supine, tenderness of the right testicle. Improved. He did not complain of this pain at today's visit. 20. Diarrhea following cycle 1 FOLFOXIRI. He did not have diarrhea following cycle 2 or 3.  Disposition-Mr. Durfee appears stable. He has completed 3 cycles of FOLFOXIRI. He is scheduled for cycle 4 today. He requests to cancel today's treatment and reschedule for 02/28/2012 due to transportation issues. We are referring him for restaging CT scans in approximately 2 weeks. He will return for a followup visit with Dr. Truett Perna on 03/13/2012. Of note, we obtained a followup CEA today.  Plan reviewed with Dr. Truett Perna.  Lonna Cobb ANP/GNP-BC

## 2012-02-24 NOTE — Telephone Encounter (Signed)
Message copied by Caleb Popp on Thu Feb 24, 2012  1:44 PM ------      Message from: Brielle, Virginia K      Created: Thu Feb 24, 2012 12:01 PM       He denied diarrhea at today's visit. Begin Kdur BID for 3 days and then daily. Repeat BMET on 1/13 or 1/14 (next treatment). Thanks.      ----- Message -----         From: [facility]         Sent: 02/24/2012   9:48 AM           To: Rana Snare, NP

## 2012-02-24 NOTE — Telephone Encounter (Signed)
eft mess. with pts gf and she will make him aware of his 1/14 appt

## 2012-02-28 ENCOUNTER — Other Ambulatory Visit: Payer: Self-pay | Admitting: *Deleted

## 2012-02-28 NOTE — Telephone Encounter (Signed)
Patient reports his pharmacy said he can't have this refilled until 03/04/12. [employer] and spoke with pharmacy: Ran script through the Medicaid system again & it was approved. Script had been marked as a duplicate. Ensured her it was not a duplicate.

## 2012-02-29 ENCOUNTER — Other Ambulatory Visit (HOSPITAL_BASED_OUTPATIENT_CLINIC_OR_DEPARTMENT_OTHER): Payer: Medicaid Other | Admitting: Lab

## 2012-02-29 ENCOUNTER — Ambulatory Visit (HOSPITAL_BASED_OUTPATIENT_CLINIC_OR_DEPARTMENT_OTHER): Payer: Medicaid Other

## 2012-02-29 DIAGNOSIS — C20 Malignant neoplasm of rectum: Secondary | ICD-10-CM

## 2012-02-29 DIAGNOSIS — C7800 Secondary malignant neoplasm of unspecified lung: Secondary | ICD-10-CM

## 2012-02-29 DIAGNOSIS — Z5111 Encounter for antineoplastic chemotherapy: Secondary | ICD-10-CM

## 2012-02-29 LAB — CBC WITH DIFFERENTIAL/PLATELET
Basophils Absolute: 0 10*3/uL (ref 0.0–0.1)
Eosinophils Absolute: 0 10*3/uL (ref 0.0–0.5)
LYMPH%: 23.2 % (ref 14.0–49.0)
MCV: 95.6 fL (ref 79.3–98.0)
MONO%: 12 % (ref 0.0–14.0)
NEUT#: 2.3 10*3/uL (ref 1.5–6.5)
Platelets: 273 10*3/uL (ref 140–400)
RBC: 4.12 10*6/uL — ABNORMAL LOW (ref 4.20–5.82)

## 2012-02-29 LAB — COMPREHENSIVE METABOLIC PANEL (CC13)
AST: 24 U/L (ref 5–34)
Albumin: 3.3 g/dL — ABNORMAL LOW (ref 3.5–5.0)
Alkaline Phosphatase: 58 U/L (ref 40–150)
Calcium: 8.9 mg/dL (ref 8.4–10.4)
Chloride: 104 mEq/L (ref 98–107)
Glucose: 92 mg/dl (ref 70–99)
Potassium: 3.5 mEq/L (ref 3.5–5.1)
Sodium: 143 mEq/L (ref 136–145)
Total Protein: 7.7 g/dL (ref 6.4–8.3)

## 2012-02-29 MED ORDER — HEPARIN SOD (PORK) LOCK FLUSH 100 UNIT/ML IV SOLN
500.0000 [IU] | Freq: Once | INTRAVENOUS | Status: DC | PRN
  Filled 2012-02-29: qty 5

## 2012-02-29 MED ORDER — DEXTROSE 5 % IV SOLN
Freq: Once | INTRAVENOUS | Status: AC
  Administered 2012-02-29: 11:00:00 via INTRAVENOUS

## 2012-02-29 MED ORDER — OXALIPLATIN CHEMO INJECTION 100 MG/20ML
85.0000 mg/m2 | Freq: Once | INTRAVENOUS | Status: AC
  Administered 2012-02-29: 135 mg via INTRAVENOUS
  Filled 2012-02-29: qty 27

## 2012-02-29 MED ORDER — LEUCOVORIN CALCIUM INJECTION 350 MG
200.0000 mg/m2 | Freq: Once | INTRAVENOUS | Status: AC
  Administered 2012-02-29: 312 mg via INTRAVENOUS
  Filled 2012-02-29: qty 15.6

## 2012-02-29 MED ORDER — DEXAMETHASONE SODIUM PHOSPHATE 4 MG/ML IJ SOLN
20.0000 mg | Freq: Once | INTRAMUSCULAR | Status: AC
  Administered 2012-02-29: 20 mg via INTRAVENOUS

## 2012-02-29 MED ORDER — IRINOTECAN HCL CHEMO INJECTION 100 MG/5ML
125.0000 mg/m2 | Freq: Once | INTRAVENOUS | Status: AC
  Administered 2012-02-29: 196 mg via INTRAVENOUS
  Filled 2012-02-29: qty 9.8

## 2012-02-29 MED ORDER — ONDANSETRON 16 MG/50ML IVPB (CHCC)
16.0000 mg | Freq: Once | INTRAVENOUS | Status: AC
  Administered 2012-02-29: 16 mg via INTRAVENOUS

## 2012-02-29 MED ORDER — SODIUM CHLORIDE 0.9 % IV SOLN
2400.0000 mg/m2 | INTRAVENOUS | Status: DC
  Administered 2012-02-29: 3750 mg via INTRAVENOUS
  Filled 2012-02-29: qty 75

## 2012-02-29 MED ORDER — ATROPINE SULFATE 1 MG/ML IJ SOLN
0.5000 mg | Freq: Once | INTRAMUSCULAR | Status: AC | PRN
  Administered 2012-02-29: 0.5 mg via INTRAVENOUS

## 2012-02-29 MED ORDER — SODIUM CHLORIDE 0.9 % IJ SOLN
10.0000 mL | INTRAMUSCULAR | Status: DC | PRN
  Filled 2012-02-29: qty 10

## 2012-02-29 NOTE — Patient Instructions (Signed)
[facility] Discharge Instructions for Patients Receiving Chemotherapy  Today you received the following chemotherapy agents: Oxaliplatin, Camptosar, Leucovorin, Fluorouracil. To help prevent nausea and vomiting after your treatment, we encourage you to take your nausea medication, Zofran. Begin taking it tomorrow morning and take it as often as prescribed for the next 72 hours.   If you develop nausea and vomiting that is not controlled by your nausea medication, call the clinic. If it is after clinic hours your family physician or the after hours number for the clinic or go to the Emergency Department.   BELOW ARE SYMPTOMS THAT SHOULD BE REPORTED IMMEDIATELY:  *FEVER GREATER THAN 100.5 F  *CHILLS WITH OR WITHOUT FEVER  NAUSEA AND VOMITING THAT IS NOT CONTROLLED WITH YOUR NAUSEA MEDICATION  *UNUSUAL SHORTNESS OF BREATH  *UNUSUAL BRUISING OR BLEEDING  TENDERNESS IN MOUTH AND THROAT WITH OR WITHOUT PRESENCE OF ULCERS  *URINARY PROBLEMS  *BOWEL PROBLEMS  UNUSUAL RASH Items with * indicate a potential emergency and should be followed up as soon as possible.  Feel free to call the clinic you have any questions or concerns. The clinic phone number is [phone].   I have been informed and understand all the instructions given to me. I know to contact the clinic, my physician, or go to the Emergency Department if any problems should occur. I do not have any questions at this time, but understand that I may call the clinic during office hours   should I have any questions or need assistance in obtaining follow up care.

## 2012-03-01 ENCOUNTER — Telehealth (INDEPENDENT_AMBULATORY_CARE_PROVIDER_SITE_OTHER): Payer: Self-pay

## 2012-03-01 NOTE — Telephone Encounter (Signed)
Attempted to speak to the patient and give him long term f/u appt.  Woman who answered the phone did not respond and then hung up.  Pt is scheduled to see Dr. [name] on 03/10/12.  Will try again later.

## 2012-03-02 ENCOUNTER — Ambulatory Visit (HOSPITAL_BASED_OUTPATIENT_CLINIC_OR_DEPARTMENT_OTHER): Payer: Medicaid Other

## 2012-03-02 VITALS — BP 117/80 | HR 99 | Temp 97.8°F

## 2012-03-02 DIAGNOSIS — C7800 Secondary malignant neoplasm of unspecified lung: Secondary | ICD-10-CM

## 2012-03-02 DIAGNOSIS — Z452 Encounter for adjustment and management of vascular access device: Secondary | ICD-10-CM

## 2012-03-02 DIAGNOSIS — C20 Malignant neoplasm of rectum: Secondary | ICD-10-CM

## 2012-03-02 MED ORDER — SODIUM CHLORIDE 0.9 % IJ SOLN
10.0000 mL | INTRAMUSCULAR | Status: DC | PRN
  Administered 2012-03-02: 10 mL
  Filled 2012-03-02: qty 10

## 2012-03-02 MED ORDER — HEPARIN SOD (PORK) LOCK FLUSH 100 UNIT/ML IV SOLN
500.0000 [IU] | Freq: Once | INTRAVENOUS | Status: AC | PRN
  Administered 2012-03-02: 500 [IU]
  Filled 2012-03-02: qty 5

## 2012-03-02 NOTE — Patient Instructions (Signed)
Call MD for problems or questions

## 2012-03-06 ENCOUNTER — Other Ambulatory Visit (HOSPITAL_COMMUNITY): Payer: Medicaid Other | Admitting: Dentistry

## 2012-03-08 ENCOUNTER — Encounter: Payer: Self-pay | Admitting: Oncology

## 2012-03-08 NOTE — Progress Notes (Signed)
Mr. Milliron called in to say he needs some more bags. I called Paediatric nurse and ordered with Olegario Messier. She will fax me the recpt for them. 66.00 is the amount I will advise the patient after this purchase he will have only 30.75 left on his grant. Called back and line busy.

## 2012-03-09 ENCOUNTER — Ambulatory Visit (HOSPITAL_COMMUNITY)
Admission: RE | Admit: 2012-03-09 | Discharge: 2012-03-09 | Disposition: A | Discharge status: Home / Self Care | Payer: Medicaid Other | Source: Ambulatory Visit | Attending: Nurse Practitioner | Admitting: Nurse Practitioner

## 2012-03-09 ENCOUNTER — Other Ambulatory Visit: Payer: Medicaid Other | Admitting: Lab

## 2012-03-09 ENCOUNTER — Encounter (HOSPITAL_COMMUNITY): Payer: Self-pay

## 2012-03-09 ENCOUNTER — Ambulatory Visit: Payer: Medicaid Other | Admitting: Oncology

## 2012-03-09 ENCOUNTER — Ambulatory Visit: Payer: Medicaid Other

## 2012-03-09 DIAGNOSIS — C20 Malignant neoplasm of rectum: Secondary | ICD-10-CM | POA: Insufficient documentation

## 2012-03-09 DIAGNOSIS — C7800 Secondary malignant neoplasm of unspecified lung: Secondary | ICD-10-CM | POA: Insufficient documentation

## 2012-03-09 MED ORDER — IOHEXOL 300 MG/ML  SOLN
100.0000 mL | Freq: Once | INTRAMUSCULAR | Status: AC | PRN
  Administered 2012-03-09: 100 mL via INTRAVENOUS

## 2012-03-10 ENCOUNTER — Other Ambulatory Visit (INDEPENDENT_AMBULATORY_CARE_PROVIDER_SITE_OTHER): Payer: Self-pay | Admitting: General Surgery

## 2012-03-10 ENCOUNTER — Ambulatory Visit (INDEPENDENT_AMBULATORY_CARE_PROVIDER_SITE_OTHER): Payer: Medicaid Other | Admitting: General Surgery

## 2012-03-11 ENCOUNTER — Other Ambulatory Visit: Payer: Self-pay | Admitting: Oncology

## 2012-03-13 ENCOUNTER — Encounter: Payer: Self-pay | Admitting: Oncology

## 2012-03-13 ENCOUNTER — Ambulatory Visit: Payer: Medicaid Other | Admitting: Oncology

## 2012-03-13 ENCOUNTER — Other Ambulatory Visit: Payer: Medicaid Other | Admitting: Lab

## 2012-03-13 NOTE — Progress Notes (Signed)
Called the pharmacy to advise Jeffery [name], the patient has depleted his funds. Zero balance. I received the invoice from [facility]. I will let Wynona Canes know.

## 2012-03-14 ENCOUNTER — Ambulatory Visit: Payer: Medicaid Other

## 2012-03-15 ENCOUNTER — Telehealth: Payer: Self-pay | Admitting: *Deleted

## 2012-03-15 ENCOUNTER — Other Ambulatory Visit: Payer: Self-pay | Admitting: *Deleted

## 2012-03-15 NOTE — Telephone Encounter (Signed)
Called home # to follow up on FTKA Monday and inquire if Skeeter was doing OK. Spoke with his mother who said "he's doing fine. He's out at the store right now". Requested she let him know we will be calling to reschedule his missed appointment.

## 2012-03-15 NOTE — Progress Notes (Signed)
Per Dr. Truett Perna: Reschedule his l/md/chemo for within the next week. POF to scheduler.

## 2012-03-16 ENCOUNTER — Telehealth: Payer: Self-pay | Admitting: Oncology

## 2012-03-16 ENCOUNTER — Encounter: Payer: Self-pay | Admitting: Oncology

## 2012-03-16 ENCOUNTER — Telehealth: Payer: Self-pay | Admitting: *Deleted

## 2012-03-16 NOTE — Telephone Encounter (Signed)
Per staff message and POF I have scheduled appts.  JMW  

## 2012-03-16 NOTE — Progress Notes (Signed)
The patient's [facility] funds are depleted. Zero balance.

## 2012-03-16 NOTE — Telephone Encounter (Signed)
Called pt and left message regarding appt on 03/22/12, lab, MD and chemo

## 2012-03-17 ENCOUNTER — Other Ambulatory Visit (INDEPENDENT_AMBULATORY_CARE_PROVIDER_SITE_OTHER): Payer: Self-pay | Admitting: General Surgery

## 2012-03-17 ENCOUNTER — Telehealth (INDEPENDENT_AMBULATORY_CARE_PROVIDER_SITE_OTHER): Payer: Self-pay

## 2012-03-17 DIAGNOSIS — Z85048 Personal history of other malignant neoplasm of rectum, rectosigmoid junction, and anus: Secondary | ICD-10-CM

## 2012-03-17 NOTE — Telephone Encounter (Signed)
LMOV pt to call.  We need to schedule his fistulogram in the near future.  Orders are in Epic.

## 2012-03-17 NOTE — Telephone Encounter (Signed)
The pt returned your call

## 2012-03-21 NOTE — Telephone Encounter (Signed)
2nd attempt to reach the patient.  Fistulogram needs to be scheduled in the near future.

## 2012-03-22 ENCOUNTER — Ambulatory Visit (HOSPITAL_BASED_OUTPATIENT_CLINIC_OR_DEPARTMENT_OTHER): Payer: Medicaid Other | Admitting: Oncology

## 2012-03-22 ENCOUNTER — Ambulatory Visit: Payer: Medicaid Other

## 2012-03-22 ENCOUNTER — Telehealth: Payer: Self-pay | Admitting: Oncology

## 2012-03-22 ENCOUNTER — Telehealth: Payer: Self-pay | Admitting: *Deleted

## 2012-03-22 ENCOUNTER — Other Ambulatory Visit (HOSPITAL_BASED_OUTPATIENT_CLINIC_OR_DEPARTMENT_OTHER): Payer: Medicare Other | Admitting: Lab

## 2012-03-22 VITALS — BP 126/86 | HR 97 | Temp 98.7°F | Resp 18 | Ht 64.0 in | Wt 123.7 lb

## 2012-03-22 DIAGNOSIS — C7800 Secondary malignant neoplasm of unspecified lung: Secondary | ICD-10-CM

## 2012-03-22 DIAGNOSIS — C20 Malignant neoplasm of rectum: Secondary | ICD-10-CM

## 2012-03-22 LAB — CBC WITH DIFFERENTIAL/PLATELET
Eosinophils Absolute: 0.1 10*3/uL (ref 0.0–0.5)
HCT: 40.3 % (ref 38.4–49.9)
LYMPH%: 14.8 % (ref 14.0–49.0)
MCHC: 33.1 g/dL (ref 32.0–36.0)
MCV: 96.5 fL (ref 79.3–98.0)
MONO%: 15.3 % — ABNORMAL HIGH (ref 0.0–14.0)
NEUT#: 3.6 10*3/uL (ref 1.5–6.5)
NEUT%: 68.3 % (ref 39.0–75.0)
Platelets: 240 10*3/uL (ref 140–400)
RBC: 4.18 10*6/uL — ABNORMAL LOW (ref 4.20–5.82)

## 2012-03-22 LAB — COMPREHENSIVE METABOLIC PANEL (CC13)
ALT: 17 U/L (ref 0–55)
AST: 21 U/L (ref 5–34)
Albumin: 3.4 g/dL — ABNORMAL LOW (ref 3.5–5.0)
Calcium: 9.3 mg/dL (ref 8.4–10.4)
Chloride: 105 mEq/L (ref 98–107)
Potassium: 3.3 mEq/L — ABNORMAL LOW (ref 3.5–5.1)
Total Protein: 7.7 g/dL (ref 6.4–8.3)

## 2012-03-22 MED ORDER — OXYCODONE-ACETAMINOPHEN 10-325 MG PO TABS: 1.0000 | ORAL_TABLET | Freq: Four times a day (QID) | ORAL | Status: DC | PRN

## 2012-03-22 MED ORDER — LORAZEPAM 0.5 MG PO TABS: ORAL_TABLET | ORAL | Status: DC

## 2012-03-22 MED ORDER — DIPHENOXYLATE-ATROPINE 2.5-0.025 MG PO TABS: 1.0000 | ORAL_TABLET | Freq: Four times a day (QID) | ORAL | Status: DC | PRN

## 2012-03-22 NOTE — Telephone Encounter (Signed)
Per staff message and POF I have scheduled appts.  JMW  

## 2012-03-22 NOTE — Telephone Encounter (Signed)
Gave pt tentative appt for lab, and ML for February 2014, emailed Marcelino Duster for chemo

## 2012-03-22 NOTE — Progress Notes (Signed)
[facility]    OFFICE PROGRESS NOTE   INTERVAL HISTORY:   Jeffery [name] returns as scheduled. He missed an office visit and schedule chemotherapy last week. He reports feeling well. Good appetite and energy level. He has pain at the pelvic drain site with certain positions. No other pain. No dyspnea. No neuropathy symptoms. He is scheduled to see Jeffery [name] tomorrow.  Objective:  Vital signs in last 24 hours:  Blood pressure 126/86, pulse 97, temperature 98.7 F (37.1 C), temperature source Oral, resp. rate 18, height 5\' 4"  (1.626 m), weight 123 lb 11.2 oz (56.11 kg).    HEENT: No thrush or ulcers Resp: Lungs clear bilaterally Cardio: Regular rate and rhythm GI: Left lower quadrant colostomy, no hepatomegaly, less than 1 cm nodular focus at the midline scar, right low abdomen drain site without evidence of infection-the drain is capped Vascular: No leg edema    Portacath/PICC-without erythema  Lab Results:  Lab Results  Component Value Date   WBC 5.3 03/22/2012   HGB 13.4 03/22/2012   HCT 40.3 03/22/2012   MCV 96.5 03/22/2012   PLT 240 03/22/2012   ANC 3.6  CEA on 02/24/2012-14.7  X-rays: CTs of the chest, abdomen, and pelvis on 03/09/2012-no change in the number of pulmonary metastases. Several lesions are smaller.no focal hepatic lesion. No significant fluid collection associated with the pelvic drain. Nodular thickening at the distal rectum extending into the presacral space and pelvic musculature. No aggressive osseous lesions. No pelvic lymphadenopathy.   Medications: I have reviewed the patient's current medications.  Assessment/Plan: 1. Clinical stage III (uT3 uN1) rectal cancer status post pelvic radiation, August 24 through November 18, 2009. He underwent a laparoscopic-assisted low anterior resection with diverting loop ileostomy, January 14, 2010, with the final pathology showing invasive adenocarcinoma focally extending into the perirectal connective  tissue, negative margins, extensive ulceration with perforation and perirectal abscess. There was no lymph node involvement (pT3 pN0). He began adjuvant CAPOX chemotherapy March 11, 2010. He completed the sixth and final cycle of CAPOX 06/25/2010. CT of the abdomen and pelvis 03/11/2011 confirmed multiple nodules at the lung bases consistent with metastatic disease. 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 He completed cycle 1 of FOLFIRI/Avastin 04/29/2011. He completed cycle 3 05/27/2011. CEA was improved on 06/10/2011. He completed cycle 4 on 06/10/2011. He completed cycle 5 on 06/24/2011. He completed cycle 6 on 07/08/2011. Restaging CT evaluation 07/22/2011 showed a slight decrease in pulmonary nodules. He completed cycle 8 FOLFIRI/Avastin on 08/12/2011. He completed cycle 9 on 09/02/2011. Chest CT on 10/07/2011 showed increased size and number of lung nodules. CT scans 12/13/2011 with mild progression of pulmonary metastatic disease; interval resolution of left upper quadrant and central pelvic fluid collections; residual gas and fluid collection in the undersurface of the left lower quadrant ventral abdominal wall; similar appearance of soft tissue thickening and enhancement to the perirectal region; abnormal small bowel dilatation and moderate colonic stool burden consistent with ileus and/or constipation. He completed cycle 1 FOLFOXIRI on 01/12/2012.cycle 4 on 02/24/2012.The CEA was slightly lower on 02/24/2012. Restaging CT on 03/09/2012 without evidence of disease progression. 2. Strangulated peristomal hernia and small bowel obstruction status post reduction of peristomal hernia, omentectomy, exploratory laparotomy, small bowel resection x2 on January 28, 2010.  3. History of rectal bleeding secondary to number 1.  4. History of an elevated CEA. Improved on 06/10/2011 and 07/29/2011. 5. History of a left adrenal nodule.  6. Weight  loss. Improved. 7. Right knee pain/effusion status post evaluation at [facility].  8. CT abdomen/pelvis 03/11/2011 with multiple nodules at the lung bases consistent with metastatic involvement of the lungs; prominent presacral soft tissue; air bubble within the presacral soft tissue appeared to be extraluminal and breakdown at the anastomotic site could not be excluded. No discrete abscess was seen. 9. Elevated blood pressure. Question effect of Avastin. He was started on Norvasc on 06/10/2011.  10. Frequent bowel movements. Likely related to multiple bowel resections. Imodium was not effective. 11. Suture at the ileostomy reversal scar. Suture removed during the recent hospitalization. 12. Erectile dysfunction. The testosterone level returned at 508 on 08/12/2011, he was referred to urology, but Medicaid would not pay for this visit. 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 Culture returned positive for abundant Escherichia coli. He completed a course of Augmentin.  14. Status post diverting loop descending colostomy 10/05/2011. 15. Status post multiple rectal biopsies 10/05/2011, all negative for malignancy. 16. Hospitalization 10/13/2011 with a bowel obstruction. Resolved. 17. Hospitalization 11/07/2011 with a recurrent small bowel obstruction. He was taken to the OR on 11/10/2011 and underwent an exploratory laparotomy with small bowel resection x2 and incidental appendectomy. He was found to have a closed loop obstruction. Pathology on #1 small bowel resection showed focal perforation with no evidence of malignancy; pathology on #2 small bowel resection showed metastatic adenocarcinoma consistent with colorectal primary; pathology on the appendix showed serosal fibrous adhesions with no evidence of malignancy.He developed postoperative respiratory failure, sepsis and renal insufficiency. He improved gradually and was able to be extubated. He  required placement of multiple percutaneous drains for intraperitoneal abscesses. He was discharged home on 12/02/2011. He has one drain remaining-no remaining abscess on the CT 03/09/2012 18. Deconditioning. Improved. 19. Low back and testicular pain when supine, tenderness of the right testicle. Improved. He did not complain of this pain at today's visit. 20. Diarrhea following cycle 1 FOLFOXIRI.    Disposition:  Jeffery [name] appears well. His performance status continues to improve. He has completed 4 cycles of FOLFOXIRI. The CEA was slightly lower on 02/24/2012 and the restaging CT reveals no clear evidence of disease progression. I suspect the nodular changes at the rectum and presacral space are related to tumor , infection,or surgery.  The plan is to proceed with additional FOLFOXIRI beginning on 03/27/2012. He will return for an office visit and chemotherapy on 04/10/2012.   Jeffery Papas, MD  03/22/2012  1:32 PM

## 2012-03-23 ENCOUNTER — Other Ambulatory Visit: Payer: Medicaid Other | Admitting: Lab

## 2012-03-23 ENCOUNTER — Telehealth: Payer: Self-pay | Admitting: Oncology

## 2012-03-23 ENCOUNTER — Ambulatory Visit (INDEPENDENT_AMBULATORY_CARE_PROVIDER_SITE_OTHER): Payer: Medicare Other | Admitting: General Surgery

## 2012-03-23 ENCOUNTER — Ambulatory Visit: Payer: Medicaid Other

## 2012-03-23 ENCOUNTER — Encounter (INDEPENDENT_AMBULATORY_CARE_PROVIDER_SITE_OTHER): Payer: Self-pay | Admitting: General Surgery

## 2012-03-23 VITALS — BP 128/84 | HR 96 | Temp 98.4°F | Resp 16 | Ht 64.5 in | Wt 126.4 lb

## 2012-03-23 DIAGNOSIS — C20 Malignant neoplasm of rectum: Secondary | ICD-10-CM

## 2012-03-23 NOTE — Telephone Encounter (Signed)
Spoke with patient by phone as he needs assist with ostomy supplies due to his insurance coverage changes.  This RN  will meet with patient at next visit , 03-28-12, and give supplies to use as recommended by [facility] nurse.  Will also give patient information on [employer] ostomy supply support.  Patient was appreciative.

## 2012-03-23 NOTE — Assessment & Plan Note (Signed)
Pt with recurrent rectal cancer.  Will likely not be able ever to reverse colostomy.    Follow up in 6 months.

## 2012-03-23 NOTE — Patient Instructions (Signed)
Call if you develop fever/ chills/nausea/vomiting.  Otherwise follow up in 6 months.

## 2012-03-23 NOTE — Progress Notes (Signed)
HISTORY: Pt doing well overall.  He restarted chemotherapy.  His disease appears stable.  He does not have any issues with fevers or chills.  He is inquiring about drain removal.  He does still have some drainage from his rectum at times.  His ostomy has been working well except when he gets diarrhea and has to change bags.     PERTINENT REVIEW OF SYSTEMS: Otherwise negative other than HPI.   EXAM: Head: Normocephalic and atraumatic.  Eyes:  Conjunctivae are normal. Pupils are equal, round, and reactive to light. No scleral icterus.  Neck:  Normal range of motion. Neck supple. No tracheal deviation present. No thyromegaly present.  Resp: No respiratory distress, normal effort. Abd:  Abdomen is soft, non distended and non tender. No masses are palpable.  There is no rebound and no guarding. Ostomy intact.  Drain capped.  Pulled.   Neurological: Alert and oriented to person, place, and time. Coordination normal.  Skin: Skin is warm and dry. No rash noted. No diaphoretic. No erythema. No pallor.  Psychiatric: Normal mood and affect. Normal behavior. Judgment and thought content normal.      ASSESSMENT AND PLAN:   ADENOCARCINOMA, RECTUM, [id number] Pt with recurrent rectal cancer.  Will likely not be able ever to reverse colostomy.    Follow up in 6 months.        Maudry Diego, MD Surgical Oncology, General & Endocrine Surgery [facility]  Geraldo Pitter, MD Renaye Rakers, MD

## 2012-03-27 ENCOUNTER — Ambulatory Visit: Payer: Medicaid Other | Admitting: Nurse Practitioner

## 2012-03-28 ENCOUNTER — Other Ambulatory Visit: Payer: Self-pay | Admitting: *Deleted

## 2012-03-28 ENCOUNTER — Ambulatory Visit (HOSPITAL_BASED_OUTPATIENT_CLINIC_OR_DEPARTMENT_OTHER): Payer: Medicare Other

## 2012-03-28 VITALS — BP 138/94 | HR 109 | Temp 97.7°F | Resp 18

## 2012-03-28 DIAGNOSIS — C20 Malignant neoplasm of rectum: Secondary | ICD-10-CM

## 2012-03-28 DIAGNOSIS — Z5111 Encounter for antineoplastic chemotherapy: Secondary | ICD-10-CM

## 2012-03-28 DIAGNOSIS — C7800 Secondary malignant neoplasm of unspecified lung: Secondary | ICD-10-CM

## 2012-03-28 MED ORDER — OXALIPLATIN CHEMO INJECTION 100 MG/20ML
85.0000 mg/m2 | Freq: Once | INTRAVENOUS | Status: AC
  Administered 2012-03-28: 135 mg via INTRAVENOUS
  Filled 2012-03-28: qty 27

## 2012-03-28 MED ORDER — ONDANSETRON 16 MG/50ML IVPB (CHCC)
16.0000 mg | Freq: Once | INTRAVENOUS | Status: AC
  Administered 2012-03-28: 16 mg via INTRAVENOUS

## 2012-03-28 MED ORDER — OXYCODONE-ACETAMINOPHEN 10-325 MG PO TABS: 1.0000 | ORAL_TABLET | Freq: Four times a day (QID) | ORAL | Status: DC | PRN

## 2012-03-28 MED ORDER — IRINOTECAN HCL CHEMO INJECTION 100 MG/5ML
125.0000 mg/m2 | Freq: Once | INTRAVENOUS | Status: AC
  Administered 2012-03-28: 196 mg via INTRAVENOUS
  Filled 2012-03-28: qty 9.8

## 2012-03-28 MED ORDER — LEUCOVORIN CALCIUM INJECTION 350 MG
200.0000 mg/m2 | Freq: Once | INTRAVENOUS | Status: AC
  Administered 2012-03-28: 312 mg via INTRAVENOUS
  Filled 2012-03-28: qty 15.6

## 2012-03-28 MED ORDER — ATROPINE SULFATE 1 MG/ML IJ SOLN
0.5000 mg | Freq: Once | INTRAMUSCULAR | Status: AC | PRN
  Administered 2012-03-28: 0.5 mg via INTRAVENOUS

## 2012-03-28 MED ORDER — DEXAMETHASONE SODIUM PHOSPHATE 4 MG/ML IJ SOLN
20.0000 mg | Freq: Once | INTRAMUSCULAR | Status: AC
  Administered 2012-03-28: 20 mg via INTRAVENOUS

## 2012-03-28 MED ORDER — SODIUM CHLORIDE 0.9 % IV SOLN
2400.0000 mg/m2 | INTRAVENOUS | Status: DC
  Administered 2012-03-28: 3750 mg via INTRAVENOUS
  Filled 2012-03-28: qty 75

## 2012-03-28 MED ORDER — DEXTROSE 5 % IV SOLN
Freq: Once | INTRAVENOUS | Status: AC
  Administered 2012-03-28: 13:00:00 via INTRAVENOUS

## 2012-03-28 NOTE — Progress Notes (Signed)
Met with patient in infusion room.  He needs assist with ostomy supplies due to his insurance coverage changes.  This RN provided patient with 2 boxes of [employer] colostomy bags.  This RN provided patient with resource sheet from [place] containing phone numbers for patient support services.  This RN also provided patient with information on the [employer] that provides free ostomy supplies to those in need.  Patient stated he would contact these organizations and call this RN with an update on the services he is able to obtain.  This RN gave contact information and will continue to follow as needed.  Patient very appreciative of the visit.

## 2012-03-28 NOTE — Progress Notes (Signed)
Pt reports his pain medication fell in the commode. Requesting refill of Percocet. Reviewed with Dr. Truett Perna, medication will be refilled for #30 tablets this one time. Pt understands there will be no further early refills.

## 2012-03-29 ENCOUNTER — Other Ambulatory Visit: Payer: Self-pay | Admitting: Certified Registered Nurse Anesthetist

## 2012-03-29 ENCOUNTER — Ambulatory Visit: Payer: Medicaid Other

## 2012-03-29 ENCOUNTER — Telehealth: Payer: Self-pay | Admitting: *Deleted

## 2012-03-29 NOTE — Progress Notes (Signed)
Patient here for pump check.  Pump off since 1 am as instructed by the 1-800#.  Port flushed and excellent blood return.  Pump alarming "Down [place]" .  Pump switched out and re- programmed per Pharmacy to a new rate of 7.60mls for 24.5 hours with patient to return 2/13 at 12:30PM.  Patient had only received of his infusion.

## 2012-03-29 NOTE — Telephone Encounter (Signed)
Patient called and having trouble with his pump. Made appt to come in to be seen./  JMW

## 2012-03-31 ENCOUNTER — Ambulatory Visit (HOSPITAL_BASED_OUTPATIENT_CLINIC_OR_DEPARTMENT_OTHER): Payer: Medicare Other

## 2012-03-31 VITALS — BP 123/88 | HR 100 | Temp 98.8°F

## 2012-03-31 DIAGNOSIS — C20 Malignant neoplasm of rectum: Secondary | ICD-10-CM

## 2012-03-31 MED ORDER — SODIUM CHLORIDE 0.9 % IJ SOLN
10.0000 mL | INTRAMUSCULAR | Status: DC | PRN
  Administered 2012-03-31: 10 mL
  Filled 2012-03-31: qty 10

## 2012-03-31 MED ORDER — HEPARIN SOD (PORK) LOCK FLUSH 100 UNIT/ML IV SOLN
500.0000 [IU] | Freq: Once | INTRAVENOUS | Status: AC | PRN
  Administered 2012-03-31: 500 [IU]
  Filled 2012-03-31: qty 5

## 2012-04-09 ENCOUNTER — Other Ambulatory Visit: Payer: Self-pay | Admitting: Oncology

## 2012-04-10 ENCOUNTER — Other Ambulatory Visit (HOSPITAL_BASED_OUTPATIENT_CLINIC_OR_DEPARTMENT_OTHER): Payer: Medicare Other | Admitting: Lab

## 2012-04-10 ENCOUNTER — Other Ambulatory Visit: Payer: Self-pay | Admitting: Nurse Practitioner

## 2012-04-10 ENCOUNTER — Encounter: Payer: Self-pay | Admitting: Nutrition

## 2012-04-10 ENCOUNTER — Ambulatory Visit (HOSPITAL_BASED_OUTPATIENT_CLINIC_OR_DEPARTMENT_OTHER): Payer: Medicare Other | Admitting: Nurse Practitioner

## 2012-04-10 ENCOUNTER — Ambulatory Visit: Payer: Medicaid Other

## 2012-04-10 VITALS — BP 132/91 | HR 98 | Temp 97.5°F | Resp 20 | Ht 64.5 in | Wt 128.6 lb

## 2012-04-10 DIAGNOSIS — C20 Malignant neoplasm of rectum: Secondary | ICD-10-CM

## 2012-04-10 LAB — COMPREHENSIVE METABOLIC PANEL (CC13)
ALT: 18 U/L (ref 0–55)
AST: 22 U/L (ref 5–34)
Alkaline Phosphatase: 74 U/L (ref 40–150)
Creatinine: 1.2 mg/dL (ref 0.7–1.3)
Sodium: 142 mEq/L (ref 136–145)
Total Bilirubin: 0.56 mg/dL (ref 0.20–1.20)

## 2012-04-10 LAB — CBC WITH DIFFERENTIAL/PLATELET
BASO%: 0.7 % (ref 0.0–2.0)
LYMPH%: 13.7 % — ABNORMAL LOW (ref 14.0–49.0)
MCHC: 34.9 g/dL (ref 32.0–36.0)
MCV: 97.4 fL (ref 79.3–98.0)
MONO#: 0.6 10*3/uL (ref 0.1–0.9)
MONO%: 10.4 % (ref 0.0–14.0)
Platelets: 292 10*3/uL (ref 140–400)
RBC: 4.14 10*6/uL — ABNORMAL LOW (ref 4.20–5.82)
RDW: 15.1 % — ABNORMAL HIGH (ref 11.0–14.6)
WBC: 5.7 10*3/uL (ref 4.0–10.3)

## 2012-04-10 LAB — CEA: CEA: 22.9 ng/mL — ABNORMAL HIGH (ref 0.0–5.0)

## 2012-04-10 MED ORDER — OXYCODONE-ACETAMINOPHEN 10-325 MG PO TABS: 1.0000 | ORAL_TABLET | Freq: Four times a day (QID) | ORAL | Status: DC | PRN

## 2012-04-10 NOTE — Progress Notes (Signed)
OFFICE PROGRESS NOTE  Interval history:  [name] returns as scheduled. He completed cycle 5 FOLFOXIRI on 03/28/2012. He denies nausea/vomiting no mouth sores. No diarrhea. He occasionally notes numbness in his feet. He continues to have a good appetite and good energy level. The pelvic drain was recently removed. He has pain at the site. He takes Percocet as needed. He denies fever.   Objective: Blood pressure 132/91, pulse 98, temperature 97.5 F (36.4 C), temperature source Oral, resp. rate 20, height 5' 4.5" (1.638 m), weight 128 lb 9.6 oz (58.333 kg).  Oropharynx is without thrush or ulceration. Lungs are clear. Regular cardiac rhythm. Port-A-Cath site is without erythema. Abdomen is soft. No hepatomegaly. Left lower quadrant colostomy. Less than 1 cm nodular focus at the midline abdominal scar. Ostomy reversal scar at the right abdomen with firmness. Extremities without edema. Vibratory sense intact over the fingertips per tuning fork exam. Previous right lower pelvic drain site is mildly tender, no erythema.  Lab Results: Lab Results  Component Value Date   WBC 5.7 04/10/2012   HGB 14.1 04/10/2012   HCT 40.3 04/10/2012   MCV 97.4 04/10/2012   PLT 292 04/10/2012    Chemistry:    Chemistry      Component Value Date/Time   NA 143 03/22/2012 0953   NA 136 12/02/2011 0600   K 3.3* 03/22/2012 0953   K 4.1 12/02/2011 0600   CL 105 03/22/2012 0953   CL 100 12/02/2011 0600   CO2 27 03/22/2012 0953   CO2 29 12/02/2011 0600   BUN 12.8 03/22/2012 0953   BUN 9 12/02/2011 0600   CREATININE 1.0 03/22/2012 0953   CREATININE 0.64 12/02/2011 0600      Component Value Date/Time   CALCIUM 9.3 03/22/2012 0953   CALCIUM 8.3* 12/02/2011 0600   ALKPHOS 68 03/22/2012 0953   ALKPHOS 58 11/29/2011 0625   AST 21 03/22/2012 0953   AST 13 11/29/2011 0625   ALT 17 03/22/2012 0953   ALT 13 11/29/2011 0625   BILITOT 0.62 03/22/2012 0953   BILITOT 0.3 11/29/2011 0625       Studies/Results: No results  found.  Medications: I have reviewed the patient's current medications.  Assessment/Plan:  1. Clinical stage III (uT3 uN1) rectal cancer status post pelvic radiation, August 24 through November 18, 2009. 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 There was no lymph node involvement (pT3 pN0). He began adjuvant CAPOX chemotherapy March 11, 2010. He completed the sixth and final cycle of CAPOX 06/25/2010. CT of the abdomen and pelvis 03/11/2011 confirmed multiple nodules at the lung bases consistent with metastatic disease. 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 He completed cycle 1 of FOLFIRI/Avastin 04/29/2011. He completed cycle 3 05/27/2011. CEA was improved on 06/10/2011. He completed cycle 4 on 06/10/2011. He completed cycle 5 on 06/24/2011. He completed cycle 6 on 07/08/2011. Restaging CT evaluation 07/22/2011 showed a slight decrease in pulmonary nodules. He completed cycle 8 FOLFIRI/Avastin on 08/12/2011. He completed cycle 9 on 09/02/2011. Chest CT on 10/07/2011 showed increased size and number of lung nodules. CT scans 12/13/2011 with mild progression of pulmonary metastatic disease; interval resolution of left upper quadrant and central pelvic fluid collections; residual gas and fluid collection in the undersurface of the left lower quadrant ventral  abdominal wall; similar appearance of soft tissue thickening and enhancement to the perirectal region; abnormal small bowel dilatation and moderate colonic stool burden consistent with ileus and/or constipation. He completed cycle 1 FOLFOXIRI on 01/12/2012; cycle 4 on 02/24/2012.The CEA was slightly lower on 02/24/2012. Restaging  CT on 03/09/2012 without evidence of disease progression. He completed cycle 5 on 03/28/2012. 2. Strangulated peristomal hernia and small bowel obstruction status post reduction of peristomal hernia, omentectomy, exploratory laparotomy, small bowel resection x2 on January 28, 2010.  3. History of rectal bleeding secondary to number 1.  4. History of an elevated CEA. Improved on 06/10/2011 and 07/29/2011. 5. History of a left adrenal nodule.  6. Weight loss. Improved. 7. Right knee pain/effusion status post evaluation at [facility].  8. CT abdomen/pelvis 03/11/2011 with multiple nodules at the lung bases consistent with metastatic involvement of the lungs; prominent presacral soft tissue; air bubble within the presacral soft tissue appeared to be extraluminal and breakdown at the anastomotic site could not be excluded. No discrete abscess was seen. 9. Elevated blood pressure. Question effect of Avastin. He was started on Norvasc on 06/10/2011.  10. Frequent bowel movements. Likely related to multiple bowel resections. Imodium was not effective. 11. Suture at the ileostomy reversal scar. Suture removed during the recent hospitalization. 12. Erectile dysfunction. The testosterone level returned at 508 on 08/12/2011, he was referred to urology, but Medicaid would not pay for this visit. 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 Culture returned positive for abundant Escherichia coli. He completed a course of Augmentin.  14. Status post diverting loop descending colostomy 10/05/2011. 15. Status post multiple rectal biopsies 10/05/2011, all negative for malignancy. 16. Hospitalization 10/13/2011 with a bowel obstruction. Resolved. 17. Hospitalization 11/07/2011 with a recurrent small bowel obstruction. He was taken to the OR on 11/10/2011 and underwent an exploratory laparotomy with small bowel resection x2 and incidental appendectomy. He was found to  have a closed loop obstruction. Pathology on #1 small bowel resection showed focal perforation with no evidence of malignancy; pathology on #2 small bowel resection showed metastatic adenocarcinoma consistent with colorectal primary; pathology on the appendix showed serosal fibrous adhesions with no evidence of malignancy.He developed postoperative respiratory failure, sepsis and renal insufficiency. He improved gradually and was able to be extubated. He required placement of multiple percutaneous drains for intraperitoneal abscesses. He was discharged home on 12/02/2011. No remaining abscess on the CT 03/09/2012. The pelvic drain has been removed. 18. Deconditioning. Improved. 19. Low back and testicular pain when supine, tenderness of the right testicle. Improved. He did not complain of this pain at today's visit. 20. Diarrhea following cycle 1 FOLFOXIRI. He did not experience diarrhea following cycle 5. 21. Pain at previous pelvic drain site. The site appeared unremarkable at today's visit. He was given a new prescription for Percocet.   Disposition-[name] appears stable. He has completed 5 cycles of FOLFOXIRI. He will return to complete cycle 6 on 04/12/2012. We will see him in followup on 04/25/2012. He will contact the office in the interim with any problems.  Plan reviewed with Dr. Truett Perna.  Lonna Cobb ANP/GNP-BC

## 2012-04-10 NOTE — Progress Notes (Signed)
I provided patient with his third and final case of Ensure Plus at his request.

## 2012-04-11 ENCOUNTER — Telehealth: Payer: Self-pay | Admitting: Oncology

## 2012-04-11 NOTE — Telephone Encounter (Signed)
Pt called today re appts and was given next appt for 2/26. Pt will get schedule when he comes in.

## 2012-04-12 ENCOUNTER — Ambulatory Visit (HOSPITAL_BASED_OUTPATIENT_CLINIC_OR_DEPARTMENT_OTHER): Payer: Medicare Other

## 2012-04-12 VITALS — BP 137/93 | HR 94 | Temp 98.5°F | Resp 18

## 2012-04-12 MED ORDER — SODIUM CHLORIDE 0.9 % IV SOLN
2400.0000 mg/m2 | INTRAVENOUS | Status: DC
  Administered 2012-04-12: 3750 mg via INTRAVENOUS
  Filled 2012-04-12: qty 75

## 2012-04-12 MED ORDER — ATROPINE SULFATE 1 MG/ML IJ SOLN: 0.5000 mg | Freq: Once | INTRAMUSCULAR | Status: DC | PRN

## 2012-04-12 MED ORDER — ONDANSETRON 16 MG/50ML IVPB (CHCC)
16.0000 mg | Freq: Once | INTRAVENOUS | Status: AC
  Administered 2012-04-12: 16 mg via INTRAVENOUS

## 2012-04-12 MED ORDER — OXALIPLATIN CHEMO INJECTION 100 MG/20ML
85.0000 mg/m2 | Freq: Once | INTRAVENOUS | Status: AC
  Administered 2012-04-12: 135 mg via INTRAVENOUS
  Filled 2012-04-12: qty 27

## 2012-04-12 MED ORDER — LEUCOVORIN CALCIUM INJECTION 350 MG
200.0000 mg/m2 | Freq: Once | INTRAVENOUS | Status: AC
  Administered 2012-04-12: 312 mg via INTRAVENOUS
  Filled 2012-04-12: qty 15.6

## 2012-04-12 MED ORDER — DEXAMETHASONE SODIUM PHOSPHATE 4 MG/ML IJ SOLN
20.0000 mg | Freq: Once | INTRAMUSCULAR | Status: AC
  Administered 2012-04-12: 20 mg via INTRAVENOUS

## 2012-04-12 MED ORDER — DEXTROSE 5 % IV SOLN
Freq: Once | INTRAVENOUS | Status: AC
  Administered 2012-04-12: 10:00:00 via INTRAVENOUS

## 2012-04-12 MED ORDER — OXYCODONE-ACETAMINOPHEN 5-325 MG PO TABS
1.0000 | ORAL_TABLET | Freq: Once | ORAL | Status: AC
  Administered 2012-04-12: 1 via ORAL

## 2012-04-12 MED ORDER — IRINOTECAN HCL CHEMO INJECTION 100 MG/5ML
125.0000 mg/m2 | Freq: Once | INTRAVENOUS | Status: AC
  Administered 2012-04-12: 196 mg via INTRAVENOUS
  Filled 2012-04-12: qty 9.8

## 2012-04-12 NOTE — Patient Instructions (Addendum)
 Cancer Center Discharge Instructions for Patients Receiving Chemotherapy  Today you received the following chemotherapy agents: Oxaliplatin, Camptosar, 5 FU, Leucovorin  To help prevent nausea and vomiting after your treatment, we encourage you to take your nausea medication : Zofran 8 mg twice daily as needed Begin taking it at 1st sign of nausea and take it as often as prescribed.   If you develop nausea and vomiting that is not controlled by your nausea medication, call the clinic. If it is after clinic hours your family physician or the after hours number for the clinic or go to the Emergency Department.  PER DR. SHERRILL: INCREASE YOUR POTASSIUM TO TWICE DAILY...   BELOW ARE SYMPTOMS THAT SHOULD BE REPORTED IMMEDIATELY:  *FEVER GREATER THAN 100.5 F  *CHILLS WITH OR WITHOUT FEVER  NAUSEA AND VOMITING THAT IS NOT CONTROLLED WITH YOUR NAUSEA MEDICATION  *UNUSUAL SHORTNESS OF BREATH  *UNUSUAL BRUISING OR BLEEDING  TENDERNESS IN MOUTH AND THROAT WITH OR WITHOUT PRESENCE OF ULCERS  *URINARY PROBLEMS  *BOWEL PROBLEMS  UNUSUAL RASH Items with * indicate a potential emergency and should be followed up as soon as possible.   Feel free to call the clinic you have any questions or concerns. The clinic phone number is [phone].   I have been informed and understand all the instructions given to me. I know to contact the clinic, my physician, or go to the Emergency Department if any problems should occur. I do not have any questions at this time, but understand that I may call the clinic during office hours   should I have any questions or need assistance in obtaining follow up care.    __________________________________________  _____________  __________ Signature of Patient or Authorized Representative            Date                   Time    __________________________________________ Nurse's Signature

## 2012-04-12 NOTE — Progress Notes (Signed)
@  1000-rates pain right abdomen at site of prior drain "5". Did not take pain med this morning. Order from MD to administer Percocet #1 po.

## 2012-04-14 ENCOUNTER — Ambulatory Visit (HOSPITAL_BASED_OUTPATIENT_CLINIC_OR_DEPARTMENT_OTHER): Payer: Medicare Other

## 2012-04-14 VITALS — BP 118/78 | HR 96 | Temp 98.9°F

## 2012-04-14 DIAGNOSIS — C20 Malignant neoplasm of rectum: Secondary | ICD-10-CM

## 2012-04-14 MED ORDER — HEPARIN SOD (PORK) LOCK FLUSH 100 UNIT/ML IV SOLN
500.0000 [IU] | Freq: Once | INTRAVENOUS | Status: AC | PRN
  Administered 2012-04-14: 500 [IU]
  Filled 2012-04-14: qty 5

## 2012-04-14 MED ORDER — SODIUM CHLORIDE 0.9 % IJ SOLN
10.0000 mL | INTRAMUSCULAR | Status: DC | PRN
  Administered 2012-04-14: 10 mL
  Filled 2012-04-14: qty 10

## 2012-04-14 NOTE — Progress Notes (Signed)
March schedule given to patient

## 2012-04-14 NOTE — Patient Instructions (Signed)
Call MD for problems or concerns 

## 2012-04-23 ENCOUNTER — Other Ambulatory Visit: Payer: Self-pay | Admitting: Oncology

## 2012-04-25 ENCOUNTER — Ambulatory Visit (HOSPITAL_BASED_OUTPATIENT_CLINIC_OR_DEPARTMENT_OTHER): Payer: Medicare Other | Admitting: Nurse Practitioner

## 2012-04-25 ENCOUNTER — Telehealth: Payer: Self-pay | Admitting: Oncology

## 2012-04-25 ENCOUNTER — Other Ambulatory Visit (HOSPITAL_BASED_OUTPATIENT_CLINIC_OR_DEPARTMENT_OTHER): Payer: Medicare Other | Admitting: Lab

## 2012-04-25 VITALS — BP 141/92 | HR 110 | Temp 98.5°F | Resp 20 | Ht 64.5 in | Wt 131.6 lb

## 2012-04-25 LAB — COMPREHENSIVE METABOLIC PANEL (CC13)
AST: 22 U/L (ref 5–34)
Albumin: 3.7 g/dL (ref 3.5–5.0)
Alkaline Phosphatase: 69 U/L (ref 40–150)
BUN: 12.6 mg/dL (ref 7.0–26.0)
Creatinine: 1.1 mg/dL (ref 0.7–1.3)
Glucose: 161 mg/dl — ABNORMAL HIGH (ref 70–99)
Potassium: 3.5 mEq/L (ref 3.5–5.1)

## 2012-04-25 LAB — CBC WITH DIFFERENTIAL/PLATELET
Basophils Absolute: 0 10*3/uL (ref 0.0–0.1)
Eosinophils Absolute: 0 10*3/uL (ref 0.0–0.5)
HGB: 13.8 g/dL (ref 13.0–17.1)
NEUT#: 4.6 10*3/uL (ref 1.5–6.5)
RBC: 4.06 10*6/uL — ABNORMAL LOW (ref 4.20–5.82)
RDW: 14.4 % (ref 11.0–14.6)
WBC: 5.9 10*3/uL (ref 4.0–10.3)
lymph#: 0.8 10*3/uL — ABNORMAL LOW (ref 0.9–3.3)

## 2012-04-25 MED ORDER — LORAZEPAM 0.5 MG PO TABS: ORAL_TABLET | ORAL | Status: DC

## 2012-04-25 MED ORDER — OXYCODONE-ACETAMINOPHEN 10-325 MG PO TABS: 1.0000 | ORAL_TABLET | Freq: Four times a day (QID) | ORAL | Status: DC | PRN

## 2012-04-25 NOTE — Telephone Encounter (Signed)
gv and printed appt schedule for pt for march and april...printed tx papper for [facility].

## 2012-04-25 NOTE — Progress Notes (Signed)
OFFICE PROGRESS NOTE  Interval history:  Jeffery [name] returns as scheduled. He completed cycle 6 FOLFOXIRI on 04/12/2012. He denies nausea/vomiting. No mouth sores. Mild diarrhea. He took Lomotil with good control. He notes numbness/tingling in the fingertips and toes with cold exposure. No persistent neuropathy symptoms. He continues to have pain at the right lower abdomen at the site of the previous drain. He is taking approximately 3 Percocet per day. He continues to have a good appetite. He is gaining weight.   Objective: Blood pressure 141/92, pulse 110, temperature 98.5 F (36.9 C), temperature source Oral, resp. rate 20, height 5' 4.5" (1.638 m), weight 131 lb 9.6 oz (59.693 kg).  Oropharynx is without thrush or ulceration. Lungs are clear. Regular cardiac rhythm. Port-A-Cath site is without erythema. Abdomen is soft and nontender. No hepatomegaly. Left lower quadrant colostomy. Extremities are without edema. Vibratory sense intact over the fingertips per tuning fork exam.  Lab Results: Lab Results  Component Value Date   WBC 5.9 04/25/2012   HGB 13.8 04/25/2012   HCT 40.5 04/25/2012   MCV 99.8* 04/25/2012   PLT 191 04/25/2012    Chemistry:    Chemistry      Component Value Date/Time   NA 141 04/25/2012 0909   NA 136 12/02/2011 0600   K 3.5 04/25/2012 0909   K 4.1 12/02/2011 0600   CL 104 04/25/2012 0909   CL 100 12/02/2011 0600   CO2 27 04/25/2012 0909   CO2 29 12/02/2011 0600   BUN 12.6 04/25/2012 0909   BUN 9 12/02/2011 0600   CREATININE 1.1 04/25/2012 0909   CREATININE 0.64 12/02/2011 0600      Component Value Date/Time   CALCIUM 9.2 04/25/2012 0909   CALCIUM 8.3* 12/02/2011 0600   ALKPHOS 69 04/25/2012 0909   ALKPHOS 58 11/29/2011 0625   AST 22 04/25/2012 0909   AST 13 11/29/2011 0625   ALT 26 04/25/2012 0909   ALT 13 11/29/2011 0625   BILITOT 0.44 04/25/2012 0909   BILITOT 0.3 11/29/2011 0625       Studies/Results: No results found.  Medications: I have reviewed the  patient's current medications.  Assessment/Plan: 1. Clinical stage III (uT3 uN1) rectal cancer status post pelvic radiation, August 24 through November 18, 2009. 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 There was no lymph node involvement (pT3 pN0). He began adjuvant CAPOX chemotherapy March 11, 2010. He completed the sixth and final cycle of CAPOX 06/25/2010. CT of the abdomen and pelvis 03/11/2011 confirmed multiple nodules at the lung bases consistent with metastatic disease. 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 He completed cycle 1 of FOLFIRI/Avastin 04/29/2011. He completed cycle 3 05/27/2011. CEA was improved on 06/10/2011. He completed cycle 4 on 06/10/2011. He completed cycle 5 on 06/24/2011. He completed cycle 6 on 07/08/2011. Restaging CT evaluation 07/22/2011 showed a slight decrease in pulmonary nodules. He completed cycle 8 FOLFIRI/Avastin on 08/12/2011. He completed cycle 9 on 09/02/2011. Chest CT on 10/07/2011 showed increased size and number of lung nodules. CT scans 12/13/2011 with mild progression of pulmonary metastatic disease; interval resolution of left upper quadrant and central pelvic fluid collections; residual gas and fluid collection in the undersurface of the left lower quadrant ventral abdominal wall; similar appearance of soft tissue thickening and enhancement  to the perirectal region; abnormal small bowel dilatation and moderate colonic stool burden consistent with ileus and/or constipation. He completed cycle 1 FOLFOXIRI on 01/12/2012; cycle 4 on 02/24/2012.The CEA was slightly lower on 02/24/2012. Restaging CT on 03/09/2012 without evidence of disease  progression. He completed cycle 5 on 03/28/2012. The CEA was more elevated on 04/10/2012 (22.9 ). He completed cycle 6 FOLFOXIRI on 04/12/2012. 2. Strangulated peristomal hernia and small bowel obstruction status post reduction of peristomal hernia, omentectomy, exploratory laparotomy, small bowel resection x2 on January 28, 2010.  3. History of rectal bleeding secondary to number 1.  4. History of an elevated CEA. Improved on 06/10/2011 and 07/29/2011. 5. History of a left adrenal nodule.  6. Weight loss. Improved. 7. Right knee pain/effusion status post evaluation at [facility].  8. CT abdomen/pelvis 03/11/2011 with multiple nodules at the lung bases consistent with metastatic involvement of the lungs; prominent presacral soft tissue; air bubble within the presacral soft tissue appeared to be extraluminal and breakdown at the anastomotic site could not be excluded. No discrete abscess was seen. 9. Elevated blood pressure. Question effect of Avastin. He was started on Norvasc on 06/10/2011.  10. Frequent bowel movements. Likely related to multiple bowel resections. Imodium was not effective. 11. Suture at the ileostomy reversal scar. Suture removed during the recent hospitalization. 12. Erectile dysfunction. The testosterone level returned at 508 on 08/12/2011, he was referred to urology, but Medicaid would not pay for this visit. 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 Culture returned positive for abundant Escherichia coli. He completed a course of Augmentin.  14. Status post diverting loop descending colostomy 10/05/2011. 15. Status post multiple rectal biopsies 10/05/2011, all negative for malignancy. 16. Hospitalization 10/13/2011 with a bowel obstruction. Resolved. 17. Hospitalization 11/07/2011 with a recurrent small bowel obstruction. He was taken to the OR on 11/10/2011 and underwent an exploratory laparotomy with small bowel resection  x2 and incidental appendectomy. He was found to have a closed loop obstruction. Pathology on #1 small bowel resection showed focal perforation with no evidence of malignancy; pathology on #2 small bowel resection showed metastatic adenocarcinoma consistent with colorectal primary; pathology on the appendix showed serosal fibrous adhesions with no evidence of malignancy.He developed postoperative respiratory failure, sepsis and renal insufficiency. He improved gradually and was able to be extubated. He required placement of multiple percutaneous drains for intraperitoneal abscesses. He was discharged home on 12/02/2011. No remaining abscess on the CT 03/09/2012. The pelvic drain has been removed. 18. Deconditioning. Improved. 19. Low back and testicular pain when supine, tenderness of the right testicle. Improved. He did not complain of this pain at today's visit. 20. Diarrhea following cycle 1 FOLFOXIRI. He did not experience diarrhea following cycle 5. 21. Pain at previous pelvic drain site. He takes Percocet as needed.  Disposition-Mr. Gores appears stable. He has completed 6 cycles of FOLFOXIRI. The CEA was slightly more elevated on 04/10/2012. We will obtain a repeat CEA on 04/26/2012. He is scheduled to return for cycle 7 on 04/26/2012. He will return for a followup visit in 2 weeks. He will contact the office in the interim with any problems.  Plan reviewed with Dr. Truett Perna.   Jeffery [name] ANP/GNP-BC

## 2012-04-26 ENCOUNTER — Ambulatory Visit (HOSPITAL_BASED_OUTPATIENT_CLINIC_OR_DEPARTMENT_OTHER): Payer: Medicaid Other

## 2012-04-26 ENCOUNTER — Other Ambulatory Visit: Payer: Self-pay | Admitting: Oncology

## 2012-04-26 VITALS — BP 123/87 | HR 99 | Temp 98.3°F | Resp 20

## 2012-04-26 MED ORDER — ATROPINE SULFATE 1 MG/ML IJ SOLN
0.5000 mg | Freq: Once | INTRAMUSCULAR | Status: AC | PRN
  Administered 2012-04-26: 0.5 mg via INTRAVENOUS

## 2012-04-26 MED ORDER — DEXAMETHASONE SODIUM PHOSPHATE 4 MG/ML IJ SOLN
20.0000 mg | Freq: Once | INTRAMUSCULAR | Status: AC
  Administered 2012-04-26: 20 mg via INTRAVENOUS

## 2012-04-26 MED ORDER — SODIUM CHLORIDE 0.9 % IJ SOLN
10.0000 mL | INTRAMUSCULAR | Status: DC | PRN
  Filled 2012-04-26: qty 10

## 2012-04-26 MED ORDER — HEPARIN SOD (PORK) LOCK FLUSH 100 UNIT/ML IV SOLN
500.0000 [IU] | Freq: Once | INTRAVENOUS | Status: DC | PRN
  Filled 2012-04-26: qty 5

## 2012-04-26 MED ORDER — IRINOTECAN HCL CHEMO INJECTION 100 MG/5ML
125.0000 mg/m2 | Freq: Once | INTRAVENOUS | Status: AC
  Administered 2012-04-26: 200 mg via INTRAVENOUS
  Filled 2012-04-26: qty 10

## 2012-04-26 MED ORDER — ATROPINE SULFATE 1 MG/ML IJ SOLN: 0.5000 mg | Freq: Once | INTRAMUSCULAR | Status: DC | PRN

## 2012-04-26 MED ORDER — DEXTROSE 5 % IV SOLN
Freq: Once | INTRAVENOUS | Status: AC
  Administered 2012-04-26: 09:00:00 via INTRAVENOUS

## 2012-04-26 MED ORDER — ONDANSETRON 16 MG/50ML IVPB (CHCC)
16.0000 mg | Freq: Once | INTRAVENOUS | Status: AC
  Administered 2012-04-26: 16 mg via INTRAVENOUS

## 2012-04-26 MED ORDER — SODIUM CHLORIDE 0.9 % IV SOLN
2400.0000 mg/m2 | INTRAVENOUS | Status: DC
  Administered 2012-04-26: 3750 mg via INTRAVENOUS
  Filled 2012-04-26: qty 75

## 2012-04-26 MED ORDER — OXALIPLATIN CHEMO INJECTION 100 MG/20ML
85.0000 mg/m2 | Freq: Once | INTRAVENOUS | Status: AC
  Administered 2012-04-26: 135 mg via INTRAVENOUS
  Filled 2012-04-26: qty 27

## 2012-04-26 MED ORDER — LEUCOVORIN CALCIUM INJECTION 350 MG
200.0000 mg/m2 | Freq: Once | INTRAMUSCULAR | Status: AC
  Administered 2012-04-26: 300 mg via INTRAVENOUS
  Filled 2012-04-26: qty 15

## 2012-04-26 NOTE — Patient Instructions (Addendum)
[facility] Discharge Instructions for Patients Receiving Chemotherapy  Today you received the following chemotherapy agents oxaliplatin, camptosar, leucovorin, 5-fu.  To help prevent nausea and vomiting after your treatment, we encourage you to take your nausea medication zofran. Begin taking it tonight and take it as often as prescribed..   If you develop nausea and vomiting that is not controlled by your nausea medication, call the clinic. If it is after clinic hours your family physician or the after hours number for the clinic or go to the Emergency Department.   BELOW ARE SYMPTOMS THAT SHOULD BE REPORTED IMMEDIATELY:  *FEVER GREATER THAN 100.5 F  *CHILLS WITH OR WITHOUT FEVER  NAUSEA AND VOMITING THAT IS NOT CONTROLLED WITH YOUR NAUSEA MEDICATION  *UNUSUAL SHORTNESS OF BREATH  *UNUSUAL BRUISING OR BLEEDING  TENDERNESS IN MOUTH AND THROAT WITH OR WITHOUT PRESENCE OF ULCERS  *URINARY PROBLEMS  *BOWEL PROBLEMS  UNUSUAL RASH Items with * indicate a potential emergency and should be followed up as soon as possible. . Feel free to call the clinic you have any questions or concerns. The clinic phone number is [phone].   I have been informed and understand all the instructions given to me. I know to contact the clinic, my physician, or go to the Emergency Department if any problems should occur. I do not have any questions at this time, but understand that I may call the clinic during office hours   should I have any questions or need assistance in obtaining follow up care.    __________________________________________  _____________  __________ Signature of Patient or Authorized Representative            Date                   Time    __________________________________________ Nurse's Signature

## 2012-04-28 ENCOUNTER — Ambulatory Visit (HOSPITAL_BASED_OUTPATIENT_CLINIC_OR_DEPARTMENT_OTHER): Payer: Medicare Other

## 2012-04-28 VITALS — BP 120/68 | HR 101 | Temp 98.4°F | Resp 18

## 2012-04-28 DIAGNOSIS — C20 Malignant neoplasm of rectum: Secondary | ICD-10-CM

## 2012-04-28 MED ORDER — HEPARIN SOD (PORK) LOCK FLUSH 100 UNIT/ML IV SOLN
500.0000 [IU] | Freq: Once | INTRAVENOUS | Status: AC | PRN
  Administered 2012-04-28: 500 [IU]
  Filled 2012-04-28: qty 5

## 2012-04-28 MED ORDER — SODIUM CHLORIDE 0.9 % IJ SOLN
10.0000 mL | INTRAMUSCULAR | Status: DC | PRN
  Administered 2012-04-28: 10 mL
  Filled 2012-04-28: qty 10

## 2012-04-28 NOTE — Patient Instructions (Signed)
Call MD for problems or concerns 

## 2012-05-07 ENCOUNTER — Other Ambulatory Visit: Payer: Self-pay | Admitting: Oncology

## 2012-05-09 ENCOUNTER — Ambulatory Visit (HOSPITAL_BASED_OUTPATIENT_CLINIC_OR_DEPARTMENT_OTHER): Payer: Medicare Other | Admitting: Nurse Practitioner

## 2012-05-09 ENCOUNTER — Other Ambulatory Visit (HOSPITAL_BASED_OUTPATIENT_CLINIC_OR_DEPARTMENT_OTHER): Payer: Medicare Other | Admitting: Lab

## 2012-05-09 VITALS — BP 129/92 | HR 83 | Temp 98.4°F | Resp 18 | Ht 64.5 in | Wt 134.0 lb

## 2012-05-09 DIAGNOSIS — C20 Malignant neoplasm of rectum: Secondary | ICD-10-CM

## 2012-05-09 DIAGNOSIS — C7800 Secondary malignant neoplasm of unspecified lung: Secondary | ICD-10-CM

## 2012-05-09 LAB — CBC WITH DIFFERENTIAL/PLATELET
Basophils Absolute: 0 10*3/uL (ref 0.0–0.1)
Eosinophils Absolute: 0 10*3/uL (ref 0.0–0.5)
HGB: 12.7 g/dL — ABNORMAL LOW (ref 13.0–17.1)
LYMPH%: 17 % (ref 14.0–49.0)
MCV: 100 fL — ABNORMAL HIGH (ref 79.3–98.0)
MONO%: 14.5 % — ABNORMAL HIGH (ref 0.0–14.0)
NEUT#: 4 10*3/uL (ref 1.5–6.5)
Platelets: 201 10*3/uL (ref 140–400)

## 2012-05-09 LAB — COMPREHENSIVE METABOLIC PANEL (CC13)
Albumin: 3.3 g/dL — ABNORMAL LOW (ref 3.5–5.0)
Alkaline Phosphatase: 62 U/L (ref 40–150)
BUN: 12.8 mg/dL (ref 7.0–26.0)
Creatinine: 1 mg/dL (ref 0.7–1.3)
Glucose: 105 mg/dl — ABNORMAL HIGH (ref 70–99)
Total Bilirubin: 0.39 mg/dL (ref 0.20–1.20)

## 2012-05-09 MED ORDER — OXYCODONE-ACETAMINOPHEN 10-325 MG PO TABS: 1.0000 | ORAL_TABLET | Freq: Four times a day (QID) | ORAL | Status: DC | PRN

## 2012-05-09 MED ORDER — DIPHENOXYLATE-ATROPINE 2.5-0.025 MG PO TABS: 1.0000 | ORAL_TABLET | Freq: Four times a day (QID) | ORAL | Status: DC | PRN

## 2012-05-09 NOTE — Progress Notes (Signed)
OFFICE PROGRESS NOTE  Interval history:  Jeffery [name] returns as scheduled. He completed cycle 7 FOLFOXIRI on 04/26/2012. He had minimal nausea. Diarrhea was controlled with Lomotil. He denies mouth sores. He has persistent mild cold sensitivity. No numbness or tingling in the absence of cold exposure. He continues to have pain at the right lower abdomen. He takes Percocet approximately 3 times per day. He has a good appetite. He is gaining weight.   Objective: Blood pressure 129/92, pulse 83, temperature 98.4 F (36.9 C), temperature source Oral, resp. rate 18, height 5' 4.5" (1.638 m), weight 134 lb (60.782 kg).  Oropharynx is without thrush or ulceration. Lungs are clear. Regular cardiac rhythm. Port-A-Cath site is without erythema. Abdomen is soft. No hepatomegaly. Left lower quadrant colostomy. Marked tenderness at the lower aspect of the midline scar and surrounding the ileostomy scar at the right abdomen. Extremities are without edema. Vibratory sense mildly decreased over the fingertips per tuning fork exam.  Lab Results: Lab Results  Component Value Date   WBC 5.9 05/09/2012   HGB 12.7* 05/09/2012   HCT 37.1* 05/09/2012   MCV 100.0* 05/09/2012   PLT 201 05/09/2012    Chemistry:    Chemistry      Component Value Date/Time   NA 143 05/09/2012 0957   NA 136 12/02/2011 0600   K 3.9 05/09/2012 0957   K 4.1 12/02/2011 0600   CL 109* 05/09/2012 0957   CL 100 12/02/2011 0600   CO2 27 05/09/2012 0957   CO2 29 12/02/2011 0600   BUN 12.8 05/09/2012 0957   BUN 9 12/02/2011 0600   CREATININE 1.0 05/09/2012 0957   CREATININE 0.64 12/02/2011 0600      Component Value Date/Time   CALCIUM 8.9 05/09/2012 0957   CALCIUM 8.3* 12/02/2011 0600   ALKPHOS 62 05/09/2012 0957   ALKPHOS 58 11/29/2011 0625   AST 25 05/09/2012 0957   AST 13 11/29/2011 0625   ALT 25 05/09/2012 0957   ALT 13 11/29/2011 0625   BILITOT 0.39 05/09/2012 0957   BILITOT 0.3 11/29/2011 0625       Studies/Results: No results  found.  Medications: I have reviewed the patient's current medications.  Assessment/Plan:  1. Clinical stage III (uT3 uN1) rectal cancer status post pelvic radiation, August 24 through November 18, 2009. 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 There was no lymph node involvement (pT3 pN0). He began adjuvant CAPOX chemotherapy March 11, 2010. He completed the sixth and final cycle of CAPOX 06/25/2010. CT of the abdomen and pelvis 03/11/2011 confirmed multiple nodules at the lung bases consistent with metastatic disease. 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 He completed cycle 1 of FOLFIRI/Avastin 04/29/2011. He completed cycle 3 05/27/2011. CEA was improved on 06/10/2011. He completed cycle 4 on 06/10/2011. He completed cycle 5 on 06/24/2011. He completed cycle 6 on 07/08/2011. Restaging CT evaluation 07/22/2011 showed a slight decrease in pulmonary nodules. He completed cycle 8 FOLFIRI/Avastin on 08/12/2011. He completed cycle 9 on 09/02/2011. Chest CT on 10/07/2011 showed increased size and number of lung nodules. CT scans 12/13/2011 with mild progression of pulmonary metastatic disease; interval resolution of left upper quadrant and central pelvic fluid collections; residual gas and fluid collection in the undersurface of the left lower quadrant ventral abdominal wall;  similar appearance of soft tissue thickening and enhancement to the perirectal region; abnormal small bowel dilatation and moderate colonic stool burden consistent with ileus and/or constipation. He completed cycle 1 FOLFOXIRI on 01/12/2012; cycle 4 on 02/24/2012.The CEA was slightly lower on 02/24/2012. Restaging  CT on 03/09/2012 without evidence of disease progression. He completed cycle 5 on 03/28/2012. The CEA was more elevated on 04/10/2012 (22.9 ). He completed cycle 6 FOLFOXIRI on 04/12/2012 and cycle 7 on 04/26/2012. CEA ordered but not done on 04/26/2012. 2. Strangulated peristomal hernia and small bowel obstruction status post reduction of peristomal hernia, omentectomy, exploratory laparotomy, small bowel resection x2 on January 28, 2010.  3. History of rectal bleeding secondary to number 1.  4. History of an elevated CEA. Improved on 06/10/2011 and 07/29/2011. 5. History of a left adrenal nodule.  6. Weight loss. Improved. 7. Right knee pain/effusion status post evaluation at [facility].  8. CT abdomen/pelvis 03/11/2011 with multiple nodules at the lung bases consistent with metastatic involvement of the lungs; prominent presacral soft tissue; air bubble within the presacral soft tissue appeared to be extraluminal and breakdown at the anastomotic site could not be excluded. No discrete abscess was seen. 9. Elevated blood pressure. Question effect of Avastin. He was started on Norvasc on 06/10/2011.  10. Frequent bowel movements. Likely related to multiple bowel resections. Imodium was not effective. 11. Suture at the ileostomy reversal scar. Suture removed during the recent hospitalization. 12. Erectile dysfunction. The testosterone level returned at 508 on 08/12/2011, he was referred to urology, but Medicaid would not pay for this visit. 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 Culture returned positive for abundant Escherichia coli. He completed a course of Augmentin.  14. Status post diverting loop descending colostomy 10/05/2011. 15. Status post multiple rectal biopsies 10/05/2011, all negative for malignancy. 16. Hospitalization 10/13/2011 with a bowel obstruction. Resolved. 17. Hospitalization 11/07/2011 with a recurrent small bowel  obstruction. He was taken to the OR on 11/10/2011 and underwent an exploratory laparotomy with small bowel resection x2 and incidental appendectomy. He was found to have a closed loop obstruction. Pathology on #1 small bowel resection showed focal perforation with no evidence of malignancy; pathology on #2 small bowel resection showed metastatic adenocarcinoma consistent with colorectal primary; pathology on the appendix showed serosal fibrous adhesions with no evidence of malignancy.He developed postoperative respiratory failure, sepsis and renal insufficiency. He improved gradually and was able to be extubated. He required placement of multiple percutaneous drains for intraperitoneal abscesses. He was discharged home on 12/02/2011. No remaining abscess on the CT 03/09/2012. The pelvic drain has been removed. 18. Deconditioning. Improved. 19. Low back and testicular pain when supine, tenderness of the right testicle. Improved. He did not complain of this pain at today's visit. 20. Diarrhea following cycle 1 FOLFOXIRI. He did not experience diarrhea following cycle 5. The diarrhea was controlled with Lomotil following cycle 7. 21. Pain at previous pelvic drain site, ileostomy scar and the lower aspect of the midline scar. He takes Percocet as needed.  Disposition-Jeffery [name] appears stable. Plan to proceed with cycle 8 FOLFOXIRI as scheduled on 05/10/2012. We will obtain a repeat CEA at that time. Restaging CT evaluation 05/19/2012. He has a followup visit with Dr. Truett Perna on 05/23/2012. He will contact the office in the interim with any problems.  Plan reviewed with Dr. Truett Perna.  Lonna Cobb ANP/GNP-BC

## 2012-05-10 ENCOUNTER — Ambulatory Visit: Payer: Medicare Other | Admitting: Lab

## 2012-05-10 ENCOUNTER — Ambulatory Visit (HOSPITAL_BASED_OUTPATIENT_CLINIC_OR_DEPARTMENT_OTHER): Payer: Medicare Other

## 2012-05-10 DIAGNOSIS — C20 Malignant neoplasm of rectum: Secondary | ICD-10-CM

## 2012-05-10 DIAGNOSIS — C7800 Secondary malignant neoplasm of unspecified lung: Secondary | ICD-10-CM

## 2012-05-10 DIAGNOSIS — Z5111 Encounter for antineoplastic chemotherapy: Secondary | ICD-10-CM

## 2012-05-10 MED ORDER — SODIUM CHLORIDE 0.9 % IV SOLN
2400.0000 mg/m2 | INTRAVENOUS | Status: DC
  Administered 2012-05-10: 3750 mg via INTRAVENOUS
  Filled 2012-05-10: qty 75

## 2012-05-10 MED ORDER — OXALIPLATIN CHEMO INJECTION 100 MG/20ML
85.0000 mg/m2 | Freq: Once | INTRAVENOUS | Status: AC
  Administered 2012-05-10: 135 mg via INTRAVENOUS
  Filled 2012-05-10: qty 27

## 2012-05-10 MED ORDER — ONDANSETRON 16 MG/50ML IVPB (CHCC)
16.0000 mg | Freq: Once | INTRAVENOUS | Status: AC
  Administered 2012-05-10: 16 mg via INTRAVENOUS

## 2012-05-10 MED ORDER — IRINOTECAN HCL CHEMO INJECTION 100 MG/5ML
125.0000 mg/m2 | Freq: Once | INTRAVENOUS | Status: AC
  Administered 2012-05-10: 196 mg via INTRAVENOUS
  Filled 2012-05-10: qty 9.8

## 2012-05-10 MED ORDER — ATROPINE SULFATE 1 MG/ML IJ SOLN
0.5000 mg | Freq: Once | INTRAMUSCULAR | Status: AC | PRN
  Administered 2012-05-10: 0.5 mg via INTRAVENOUS

## 2012-05-10 MED ORDER — LEUCOVORIN CALCIUM INJECTION 350 MG
192.0000 mg/m2 | Freq: Once | INTRAVENOUS | Status: AC
  Administered 2012-05-10: 300 mg via INTRAVENOUS
  Filled 2012-05-10: qty 15

## 2012-05-10 MED ORDER — DEXTROSE 5 % IV SOLN
Freq: Once | INTRAVENOUS | Status: AC
  Administered 2012-05-10: 10:00:00 via INTRAVENOUS

## 2012-05-10 MED ORDER — DEXAMETHASONE SODIUM PHOSPHATE 4 MG/ML IJ SOLN
20.0000 mg | Freq: Once | INTRAMUSCULAR | Status: AC
  Administered 2012-05-10: 20 mg via INTRAVENOUS

## 2012-05-10 NOTE — Patient Instructions (Addendum)
[facility] Discharge Instructions for Patients Receiving Chemotherapy  Today you received the following chemotherapy agents Oxaliplatin, Irinotecan, Luekovorin, 5FU  To help prevent nausea and vomiting after your treatment, we encourage you to take your nausea medication as directed.   If you develop nausea and vomiting that is not controlled by your nausea medication, call the clinic. If it is after clinic hours your family physician or the after hours number for the clinic or go to the Emergency Department.   BELOW ARE SYMPTOMS THAT SHOULD BE REPORTED IMMEDIATELY:  *FEVER GREATER THAN 100.5 F  *CHILLS WITH OR WITHOUT FEVER  NAUSEA AND VOMITING THAT IS NOT CONTROLLED WITH YOUR NAUSEA MEDICATION  *UNUSUAL SHORTNESS OF BREATH  *UNUSUAL BRUISING OR BLEEDING  TENDERNESS IN MOUTH AND THROAT WITH OR WITHOUT PRESENCE OF ULCERS  *URINARY PROBLEMS  *BOWEL PROBLEMS  UNUSUAL RASH Items with * indicate a potential emergency and should be followed up as soon as possible.  One of the nurses will contact you 24 hours after your treatment. Please let the nurse know about any problems that you may have experienced. Feel free to call the clinic you have any questions or concerns. The clinic phone number is [phone].   I have been informed and understand all the instructions given to me. I know to contact the clinic, my physician, or go to the Emergency Department if any problems should occur. I do not have any questions at this time, but understand that I may call the clinic during office hours   should I have any questions or need assistance in obtaining follow up care.    __________________________________________  _____________  __________ Signature of Patient or Authorized Representative            Date                   Time    __________________________________________ Nurse's Signature

## 2012-05-11 LAB — CEA: CEA: 14.2 ng/mL — ABNORMAL HIGH (ref 0.0–5.0)

## 2012-05-12 ENCOUNTER — Ambulatory Visit (HOSPITAL_BASED_OUTPATIENT_CLINIC_OR_DEPARTMENT_OTHER): Payer: Medicare Other

## 2012-05-12 VITALS — BP 122/82 | HR 83 | Temp 98.2°F | Resp 18

## 2012-05-12 DIAGNOSIS — C20 Malignant neoplasm of rectum: Secondary | ICD-10-CM

## 2012-05-12 MED ORDER — HEPARIN SOD (PORK) LOCK FLUSH 100 UNIT/ML IV SOLN
500.0000 [IU] | Freq: Once | INTRAVENOUS | Status: AC | PRN
  Administered 2012-05-12: 500 [IU]
  Filled 2012-05-12: qty 5

## 2012-05-12 MED ORDER — SODIUM CHLORIDE 0.9 % IJ SOLN
10.0000 mL | INTRAMUSCULAR | Status: DC | PRN
  Administered 2012-05-12: 10 mL
  Filled 2012-05-12: qty 10

## 2012-05-12 NOTE — Progress Notes (Signed)
Patient here for pump dc and patient stated that the pump went off at 7:15 this morning and he took the batteries out thinking the infusion was conmpleted since the fluid in the bag seemed empty.  RN replaced the batteries and acuatally there was 196.70mls of 225 mls infused.  Dr. Truett Perna made aware and decision made to stop the infusion at this time. Talked with patient to be more aware of the message on the pump.  The pump was alerting down occlusion when attempted to prime an hour.  RN to have pump checked out before using again on another patient.

## 2012-05-12 NOTE — Patient Instructions (Signed)
Call MD for problems or concerns 

## 2012-05-19 ENCOUNTER — Ambulatory Visit (HOSPITAL_COMMUNITY)
Admission: RE | Admit: 2012-05-19 | Discharge: 2012-05-19 | Disposition: A | Discharge status: Home / Self Care | Payer: Medicare Other | Source: Ambulatory Visit | Attending: Nurse Practitioner | Admitting: Nurse Practitioner

## 2012-05-19 ENCOUNTER — Encounter (HOSPITAL_COMMUNITY): Payer: Self-pay

## 2012-05-19 ENCOUNTER — Ambulatory Visit (HOSPITAL_COMMUNITY): Payer: Medicare Other

## 2012-05-19 DIAGNOSIS — Z933 Colostomy status: Secondary | ICD-10-CM | POA: Insufficient documentation

## 2012-05-19 DIAGNOSIS — Z923 Personal history of irradiation: Secondary | ICD-10-CM | POA: Insufficient documentation

## 2012-05-19 DIAGNOSIS — C20 Malignant neoplasm of rectum: Secondary | ICD-10-CM | POA: Insufficient documentation

## 2012-05-19 DIAGNOSIS — R918 Other nonspecific abnormal finding of lung field: Secondary | ICD-10-CM | POA: Insufficient documentation

## 2012-05-19 DIAGNOSIS — C7800 Secondary malignant neoplasm of unspecified lung: Secondary | ICD-10-CM | POA: Insufficient documentation

## 2012-05-19 DIAGNOSIS — N289 Disorder of kidney and ureter, unspecified: Secondary | ICD-10-CM | POA: Insufficient documentation

## 2012-05-19 DIAGNOSIS — I7090 Unspecified atherosclerosis: Secondary | ICD-10-CM | POA: Insufficient documentation

## 2012-05-19 DIAGNOSIS — I2510 Atherosclerotic heart disease of native coronary artery without angina pectoris: Secondary | ICD-10-CM | POA: Insufficient documentation

## 2012-05-19 DIAGNOSIS — Z79899 Other long term (current) drug therapy: Secondary | ICD-10-CM | POA: Insufficient documentation

## 2012-05-19 MED ORDER — IOHEXOL 300 MG/ML  SOLN: 100.0000 mL | Freq: Once | INTRAMUSCULAR | Status: AC | PRN

## 2012-05-21 ENCOUNTER — Other Ambulatory Visit: Payer: Self-pay | Admitting: Oncology

## 2012-05-23 ENCOUNTER — Telehealth: Payer: Self-pay | Admitting: Oncology

## 2012-05-23 ENCOUNTER — Other Ambulatory Visit (HOSPITAL_BASED_OUTPATIENT_CLINIC_OR_DEPARTMENT_OTHER): Payer: Medicare Other

## 2012-05-23 ENCOUNTER — Ambulatory Visit (HOSPITAL_BASED_OUTPATIENT_CLINIC_OR_DEPARTMENT_OTHER): Payer: Medicare Other | Admitting: Oncology

## 2012-05-23 VITALS — BP 128/87 | HR 99 | Temp 97.2°F | Resp 18 | Ht 64.5 in | Wt 133.4 lb

## 2012-05-23 DIAGNOSIS — C7800 Secondary malignant neoplasm of unspecified lung: Secondary | ICD-10-CM

## 2012-05-23 DIAGNOSIS — C20 Malignant neoplasm of rectum: Secondary | ICD-10-CM

## 2012-05-23 LAB — CBC WITH DIFFERENTIAL/PLATELET
Basophils Absolute: 0 10*3/uL (ref 0.0–0.1)
EOS%: 0.4 % (ref 0.0–7.0)
HCT: 39.3 % (ref 38.4–49.9)
HGB: 13 g/dL (ref 13.0–17.1)
LYMPH%: 16.1 % (ref 14.0–49.0)
MCH: 32.9 pg (ref 27.2–33.4)
MCV: 99.5 fL — ABNORMAL HIGH (ref 79.3–98.0)
MONO%: 9.8 % (ref 0.0–14.0)
NEUT%: 73.5 % (ref 39.0–75.0)

## 2012-05-23 LAB — COMPREHENSIVE METABOLIC PANEL (CC13)
Albumin: 3.5 g/dL (ref 3.5–5.0)
Alkaline Phosphatase: 66 U/L (ref 40–150)
BUN: 13.7 mg/dL (ref 7.0–26.0)
CO2: 26 mEq/L (ref 22–29)
Calcium: 9 mg/dL (ref 8.4–10.4)
Glucose: 129 mg/dl — ABNORMAL HIGH (ref 70–99)
Potassium: 3.9 mEq/L (ref 3.5–5.1)
Total Protein: 7.7 g/dL (ref 6.4–8.3)

## 2012-05-23 MED ORDER — LORAZEPAM 0.5 MG PO TABS: ORAL_TABLET | ORAL | Status: DC

## 2012-05-23 MED ORDER — POTASSIUM CHLORIDE CRYS ER 20 MEQ PO TBCR: 20.0000 meq | EXTENDED_RELEASE_TABLET | Freq: Every day | ORAL | Status: DC

## 2012-05-23 MED ORDER — OXYCODONE-ACETAMINOPHEN 10-325 MG PO TABS: 1.0000 | ORAL_TABLET | Freq: Four times a day (QID) | ORAL | Status: DC | PRN

## 2012-05-23 NOTE — Progress Notes (Signed)
[facility]    OFFICE PROGRESS NOTE   INTERVAL HISTORY:   He completed another cycle of chemotherapy on 05/10/2012. He tolerated the chemotherapy well. He reports one day of diarrhea following chemotherapy. No significant neuropathy symptoms. He is working and has started running for exercise. He continues to have pain in the right abdomen and right lower chest.  Objective:  Vital signs in last 24 hours:  Blood pressure 128/87, pulse 99, temperature 97.2 F (36.2 C), temperature source Oral, resp. rate 18, height 5' 4.5" (1.638 m), weight 133 lb 6.4 oz (60.51 kg).    HEENT: No thrush or ulcers Lymphatics: No cervical, supra-clavicular, axillary, or inguinal nodes Resp: Lungs clear bilaterally Cardio: Regular rate and rhythm GI: No hepatomegaly, left lower quadrant ostomy. Firm tissue surrounding the midline scar and right colostomy scar. No discrete mass. Tenderness with superficial palpation at the right colostomy scar and at the right costal margin. Vascular: No leg edema Neuro: The vibratory sense is mildly diminished at the fingertips bilaterally    Portacath/PICC-without erythema  Lab Results:  Lab Results  Component Value Date   WBC 5.3 05/23/2012   HGB 13.0 05/23/2012   HCT 39.3 05/23/2012   MCV 99.5* 05/23/2012   PLT 178 05/23/2012   ANC 3.9 CEA on 05/10/2012-14.2  X-rays: Restaging CTs of the chest, abdomen, and pelvis on 05/19/2012-stable bilateral pulmonary nodules. The liver and adrenal glands are unremarkable. Focal soft tissue thickening at the posterior lateral aspect of the spleen-unchanged, postoperative changes at the rectum with perirectal and presacral soft tissue thickening. No definite pathologically enlarged lymph nodes.  Medications: I have reviewed the patient's current medications.  Assessment/Plan: 1. Clinical stage III (uT3 uN1) rectal cancer status post pelvic radiation, August 24 through November 18, 2009. He underwent a  laparoscopic-assisted low anterior resection with diverting loop ileostomy, January 14, 2010, with the final pathology showing invasive adenocarcinoma focally extending into the perirectal connective tissue, negative margins, extensive ulceration with perforation and perirectal abscess. There was no lymph node involvement (pT3 pN0). He began adjuvant CAPOX chemotherapy March 11, 2010. He completed the sixth and final cycle of CAPOX 06/25/2010. CT of the abdomen and pelvis 03/11/2011 confirmed multiple nodules at the lung bases consistent with metastatic disease. 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 He completed cycle 1 of FOLFIRI/Avastin 04/29/2011. He completed cycle 3 05/27/2011. CEA was improved on 06/10/2011. He completed cycle 4 on 06/10/2011. He completed cycle 5 on 06/24/2011. He completed cycle 6 on 07/08/2011. Restaging CT evaluation 07/22/2011 showed a slight decrease in pulmonary nodules. He completed cycle 8 FOLFIRI/Avastin on 08/12/2011. He completed cycle 9 on 09/02/2011. Chest CT on 10/07/2011 showed increased size and number of lung nodules. CT scans 12/13/2011 with mild progression of pulmonary metastatic disease; interval resolution of left upper quadrant and central pelvic fluid collections; residual gas and fluid collection in the undersurface of the left lower quadrant ventral abdominal wall; similar appearance of soft tissue thickening and enhancement to the perirectal region; abnormal small bowel dilatation and moderate colonic stool burden consistent with ileus and/or constipation. He completed cycle 1 FOLFOXIRI on 01/12/2012; cycle 4 on 02/24/2012.The CEA was slightly lower on 02/24/2012. Restaging CT on 03/09/2012 without evidence of disease progression. He completed cycle 5 on 03/28/2012. The CEA was more elevated on 04/10/2012 (22.9 ). He completed cycle 6 FOLFOXIRI on 04/12/2012 ,cycle 7 on 04/26/2012, and cycling  on 05/10/2012. CEA was improved on 05/10/2012. Restaging CT  05/19/2012 revealed stable lung nodules and stable postoperative changes in the abdomen/pelvis 2. Strangulated peristomal hernia and small bowel obstruction status post reduction of peristomal hernia, omentectomy, exploratory laparotomy, small bowel resection x2 on January 28, 2010.  3. History of rectal bleeding secondary to number 1.  4. History of an elevated CEA. Improved on 06/10/2011 and 07/29/2011. 5. History of a left adrenal nodule.  6. Weight loss. Improved. 7. Right knee pain/effusion status post evaluation at [facility].  8. CT abdomen/pelvis 03/11/2011 with multiple nodules at the lung bases consistent with metastatic involvement of the lungs; prominent presacral soft tissue; air bubble within the presacral soft tissue appeared to be extraluminal and breakdown at the anastomotic site could not be excluded. No discrete abscess was seen. 9. Elevated blood pressure. Question effect of Avastin. He was started on Norvasc on 06/10/2011.  10. Frequent bowel movements. Likely related to multiple bowel resections. Imodium was not effective. 11. Suture at the ileostomy reversal scar. Suture removed during the recent hospitalization. 12. Erectile dysfunction. The testosterone level returned at 508 on 08/12/2011, he was referred to urology, but Medicaid would not pay for this visit. 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 Culture returned positive for abundant Escherichia coli. He completed a course of Augmentin.  14. Status post diverting loop descending colostomy 10/05/2011. 15. Status post multiple rectal biopsies 10/05/2011, all negative for malignancy. 16. Hospitalization 10/13/2011 with a bowel obstruction. Resolved. 17. Hospitalization 11/07/2011 with a recurrent small bowel obstruction. He was taken to the OR on 11/10/2011 and underwent an exploratory laparotomy with small bowel  resection x2 and incidental appendectomy. He was found to have a closed loop obstruction. Pathology on #1 small bowel resection showed focal perforation with no evidence of malignancy; pathology on #2 small bowel resection showed metastatic adenocarcinoma consistent with colorectal primary; pathology on the appendix showed serosal fibrous adhesions with no evidence of malignancy.He developed postoperative respiratory failure, sepsis and renal insufficiency. He improved gradually and was able to be extubated. He required placement of multiple percutaneous drains for intraperitoneal abscesses. He was discharged home on 12/02/2011. No remaining abscess on the CT 03/09/2012. The pelvic drain has been removed. 18. Deconditioning. Improved. 19. Low back and testicular pain when supine, tenderness of the right testicle. Improved. He did not complain of this pain at today's visit. 20. Diarrhea following cycle 1 FOLFOXIRI. 21. Pain at previous pelvic drain site, ileostomy scar and the lower aspect of the midline scar. He takes Percocet as needed. ? neuropathic pain  Disposition:  His performance status remains improved. The CEA is lower and the restaging CT reveals no evidence of progressive metastatic disease. The plan is to continue FOLFOXIRI. He would like to delay the next cycle of chemotherapy until 2012-06-02 secondary to a death in his family. Mr. Woon will return for an office visit and another cycle of chemotherapy on 06/14/2012.   Thornton Papas, MD  05/23/2012  6:26 PM

## 2012-05-23 NOTE — Telephone Encounter (Signed)
gv and printed appt schedule for pt for April and May...gv melissa chemo to add...pt aware

## 2012-05-23 NOTE — Progress Notes (Signed)
Met with patient to assess for needs.  He states that although he has signed up for the [employer] program through [employer], he has not received any supplies for his colostomy.  This RN supplied patient with a box of colostomy bags from [place] and also contacted the [employer], Janelle Floor, at [phone].  She stated the patient should have received supplies from [facility].  She stated she would send out supplies today and would contact, Corrie Dandy, at [facility] to figure out what happened with his supplies.  She also stated she would contact the patient confirming his name, DOB, address and phone numbers.

## 2012-05-24 ENCOUNTER — Ambulatory Visit: Payer: Medicare Other

## 2012-05-29 ENCOUNTER — Other Ambulatory Visit: Payer: Medicare Other | Admitting: Lab

## 2012-05-30 ENCOUNTER — Other Ambulatory Visit (HOSPITAL_BASED_OUTPATIENT_CLINIC_OR_DEPARTMENT_OTHER): Payer: Medicare Other

## 2012-05-30 ENCOUNTER — Ambulatory Visit (HOSPITAL_BASED_OUTPATIENT_CLINIC_OR_DEPARTMENT_OTHER): Payer: Medicare Other

## 2012-05-30 VITALS — BP 137/93 | HR 87 | Temp 98.4°F | Resp 18

## 2012-05-30 DIAGNOSIS — C20 Malignant neoplasm of rectum: Secondary | ICD-10-CM

## 2012-05-30 DIAGNOSIS — C7800 Secondary malignant neoplasm of unspecified lung: Secondary | ICD-10-CM

## 2012-05-30 DIAGNOSIS — Z5111 Encounter for antineoplastic chemotherapy: Secondary | ICD-10-CM

## 2012-05-30 LAB — COMPREHENSIVE METABOLIC PANEL (CC13)
Albumin: 3.3 g/dL — ABNORMAL LOW (ref 3.5–5.0)
Alkaline Phosphatase: 68 U/L (ref 40–150)
BUN: 9.1 mg/dL (ref 7.0–26.0)
CO2: 27 mEq/L (ref 22–29)
Calcium: 8.7 mg/dL (ref 8.4–10.4)
Chloride: 107 mEq/L (ref 98–107)
Glucose: 98 mg/dl (ref 70–99)
Potassium: 3.7 mEq/L (ref 3.5–5.1)
Sodium: 141 mEq/L (ref 136–145)
Total Protein: 7.3 g/dL (ref 6.4–8.3)

## 2012-05-30 LAB — CBC WITH DIFFERENTIAL/PLATELET
Basophils Absolute: 0 10*3/uL (ref 0.0–0.1)
Eosinophils Absolute: 0.1 10*3/uL (ref 0.0–0.5)
HGB: 13.7 g/dL (ref 13.0–17.1)
MCV: 100 fL — ABNORMAL HIGH (ref 79.3–98.0)
MONO#: 0.5 10*3/uL (ref 0.1–0.9)
NEUT#: 3 10*3/uL (ref 1.5–6.5)
RBC: 4.15 10*6/uL — ABNORMAL LOW (ref 4.20–5.82)
RDW: 14.1 % (ref 11.0–14.6)
WBC: 4.5 10*3/uL (ref 4.0–10.3)
lymph#: 1 10*3/uL (ref 0.9–3.3)

## 2012-05-30 MED ORDER — IRINOTECAN HCL CHEMO INJECTION 100 MG/5ML
128.0000 mg/m2 | Freq: Once | INTRAVENOUS | Status: AC
  Administered 2012-05-30: 200 mg via INTRAVENOUS
  Filled 2012-05-30: qty 10

## 2012-05-30 MED ORDER — DEXTROSE 5 % IV SOLN
Freq: Once | INTRAVENOUS | Status: AC
  Administered 2012-05-30: 10:00:00 via INTRAVENOUS

## 2012-05-30 MED ORDER — DEXAMETHASONE SODIUM PHOSPHATE 4 MG/ML IJ SOLN
20.0000 mg | Freq: Once | INTRAMUSCULAR | Status: AC
  Administered 2012-05-30: 20 mg via INTRAVENOUS

## 2012-05-30 MED ORDER — ONDANSETRON 16 MG/50ML IVPB (CHCC)
16.0000 mg | Freq: Once | INTRAVENOUS | Status: AC
  Administered 2012-05-30: 16 mg via INTRAVENOUS

## 2012-05-30 MED ORDER — LEUCOVORIN CALCIUM INJECTION 350 MG
192.0000 mg/m2 | Freq: Once | INTRAVENOUS | Status: AC
  Administered 2012-05-30: 300 mg via INTRAVENOUS
  Filled 2012-05-30: qty 15

## 2012-05-30 MED ORDER — SODIUM CHLORIDE 0.9 % IV SOLN
2400.0000 mg/m2 | INTRAVENOUS | Status: DC
  Administered 2012-05-30: 3750 mg via INTRAVENOUS
  Filled 2012-05-30: qty 75

## 2012-05-30 MED ORDER — OXALIPLATIN CHEMO INJECTION 100 MG/20ML
85.0000 mg/m2 | Freq: Once | INTRAVENOUS | Status: AC
  Administered 2012-05-30: 135 mg via INTRAVENOUS
  Filled 2012-05-30: qty 27

## 2012-05-30 MED ORDER — ATROPINE SULFATE 1 MG/ML IJ SOLN
0.5000 mg | Freq: Once | INTRAMUSCULAR | Status: AC | PRN
  Administered 2012-05-30: 0.5 mg via INTRAVENOUS

## 2012-05-30 NOTE — Patient Instructions (Addendum)
[facility] Discharge Instructions for Patients Receiving Chemotherapy  Today you received the following chemotherapy agents Oxaliplatin, Leucovorin, irinotecan and Adrucil.  To help prevent nausea and vomiting after your treatment, we encourage you to take your nausea medication. Begin taking your nausea medication as often as prescribed for by Dr. Truett Perna.    If you develop nausea and vomiting that is not controlled by your nausea medication, call the clinic. If it is after clinic hours your family physician or the after hours number for the clinic or go to the Emergency Department.   BELOW ARE SYMPTOMS THAT SHOULD BE REPORTED IMMEDIATELY:  *FEVER GREATER THAN 100.5 F  *CHILLS WITH OR WITHOUT FEVER  NAUSEA AND VOMITING THAT IS NOT CONTROLLED WITH YOUR NAUSEA MEDICATION  *UNUSUAL SHORTNESS OF BREATH  *UNUSUAL BRUISING OR BLEEDING  TENDERNESS IN MOUTH AND THROAT WITH OR WITHOUT PRESENCE OF ULCERS  *URINARY PROBLEMS  *BOWEL PROBLEMS  UNUSUAL RASH Items with * indicate a potential emergency and should be followed up as soon as possible.  One of the nurses will contact you 24 hours after your treatment. Please let the nurse know about any problems that you may have experienced. Feel free to call the clinic you have any questions or concerns. The clinic phone number is [phone].   I have been informed and understand all the instructions given to me. I know to contact the clinic, my physician, or go to the Emergency Department if any problems should occur. I do not have any questions at this time, but understand that I may call the clinic during office hours   should I have any questions or need assistance in obtaining follow up care.    __________________________________________  _____________  __________ Signature of Patient or Authorized Representative            Date                   Time    __________________________________________ Nurse's  Signature

## 2012-06-01 ENCOUNTER — Ambulatory Visit (HOSPITAL_BASED_OUTPATIENT_CLINIC_OR_DEPARTMENT_OTHER): Payer: Medicaid Other

## 2012-06-01 VITALS — BP 119/74 | HR 99 | Temp 98.5°F

## 2012-06-01 DIAGNOSIS — C20 Malignant neoplasm of rectum: Secondary | ICD-10-CM

## 2012-06-01 MED ORDER — HEPARIN SOD (PORK) LOCK FLUSH 100 UNIT/ML IV SOLN
500.0000 [IU] | Freq: Once | INTRAVENOUS | Status: AC | PRN
  Administered 2012-06-01: 500 [IU]
  Filled 2012-06-01: qty 5

## 2012-06-01 MED ORDER — SODIUM CHLORIDE 0.9 % IJ SOLN
10.0000 mL | INTRAMUSCULAR | Status: DC | PRN
  Administered 2012-06-01: 10 mL
  Filled 2012-06-01: qty 10

## 2012-06-01 NOTE — Patient Instructions (Signed)
Call MD for problems or concerns 

## 2012-06-14 ENCOUNTER — Other Ambulatory Visit (HOSPITAL_BASED_OUTPATIENT_CLINIC_OR_DEPARTMENT_OTHER): Payer: Medicare Other | Admitting: Lab

## 2012-06-14 ENCOUNTER — Ambulatory Visit (HOSPITAL_BASED_OUTPATIENT_CLINIC_OR_DEPARTMENT_OTHER): Payer: Medicare Other

## 2012-06-14 ENCOUNTER — Ambulatory Visit (HOSPITAL_BASED_OUTPATIENT_CLINIC_OR_DEPARTMENT_OTHER): Payer: Medicare Other | Admitting: Oncology

## 2012-06-14 ENCOUNTER — Other Ambulatory Visit: Payer: Self-pay | Admitting: *Deleted

## 2012-06-14 ENCOUNTER — Telehealth: Payer: Self-pay | Admitting: *Deleted

## 2012-06-14 VITALS — BP 138/91 | HR 94 | Temp 97.9°F | Resp 18 | Ht 64.5 in | Wt 136.7 lb

## 2012-06-14 DIAGNOSIS — N529 Male erectile dysfunction, unspecified: Secondary | ICD-10-CM

## 2012-06-14 DIAGNOSIS — C20 Malignant neoplasm of rectum: Secondary | ICD-10-CM

## 2012-06-14 DIAGNOSIS — Z5111 Encounter for antineoplastic chemotherapy: Secondary | ICD-10-CM

## 2012-06-14 DIAGNOSIS — C779 Secondary and unspecified malignant neoplasm of lymph node, unspecified: Secondary | ICD-10-CM

## 2012-06-14 DIAGNOSIS — C7800 Secondary malignant neoplasm of unspecified lung: Secondary | ICD-10-CM

## 2012-06-14 DIAGNOSIS — G589 Mononeuropathy, unspecified: Secondary | ICD-10-CM

## 2012-06-14 DIAGNOSIS — I10 Essential (primary) hypertension: Secondary | ICD-10-CM

## 2012-06-14 LAB — COMPREHENSIVE METABOLIC PANEL (CC13)
ALT: 19 U/L (ref 0–55)
BUN: 13.8 mg/dL (ref 7.0–26.0)
CO2: 26 mEq/L (ref 22–29)
Calcium: 8.8 mg/dL (ref 8.4–10.4)
Chloride: 108 mEq/L — ABNORMAL HIGH (ref 98–107)
Creatinine: 1 mg/dL (ref 0.7–1.3)

## 2012-06-14 LAB — CBC WITH DIFFERENTIAL/PLATELET
Eosinophils Absolute: 0 10*3/uL (ref 0.0–0.5)
HCT: 37.9 % — ABNORMAL LOW (ref 38.4–49.9)
HGB: 12.8 g/dL — ABNORMAL LOW (ref 13.0–17.1)
LYMPH%: 14.9 % (ref 14.0–49.0)
MONO#: 0.6 10*3/uL (ref 0.1–0.9)
NEUT#: 3.9 10*3/uL (ref 1.5–6.5)
NEUT%: 73.6 % (ref 39.0–75.0)
Platelets: 220 10*3/uL (ref 140–400)
WBC: 5.3 10*3/uL (ref 4.0–10.3)

## 2012-06-14 MED ORDER — SODIUM CHLORIDE 0.9 % IV SOLN
2400.0000 mg/m2 | INTRAVENOUS | Status: DC
  Administered 2012-06-14: 3750 mg via INTRAVENOUS
  Filled 2012-06-14: qty 75

## 2012-06-14 MED ORDER — IRINOTECAN HCL CHEMO INJECTION 100 MG/5ML
128.0000 mg/m2 | Freq: Once | INTRAVENOUS | Status: AC
  Administered 2012-06-14: 200 mg via INTRAVENOUS
  Filled 2012-06-14: qty 10

## 2012-06-14 MED ORDER — ATROPINE SULFATE 1 MG/ML IJ SOLN
0.5000 mg | Freq: Once | INTRAMUSCULAR | Status: AC | PRN
  Administered 2012-06-14: 0.5 mg via INTRAVENOUS

## 2012-06-14 MED ORDER — AMLODIPINE BESYLATE 10 MG PO TABS: 10.0000 mg | ORAL_TABLET | Freq: Every day | ORAL | Status: DC

## 2012-06-14 MED ORDER — OXALIPLATIN CHEMO INJECTION 100 MG/20ML
85.0000 mg/m2 | Freq: Once | INTRAVENOUS | Status: AC
  Administered 2012-06-14: 135 mg via INTRAVENOUS
  Filled 2012-06-14: qty 27

## 2012-06-14 MED ORDER — DEXTROSE 5 % IV SOLN
Freq: Once | INTRAVENOUS | Status: AC
  Administered 2012-06-14: 11:00:00 via INTRAVENOUS

## 2012-06-14 MED ORDER — OXYCODONE-ACETAMINOPHEN 10-325 MG PO TABS: 1.0000 | ORAL_TABLET | Freq: Four times a day (QID) | ORAL | Status: DC | PRN

## 2012-06-14 MED ORDER — ONDANSETRON 16 MG/50ML IVPB (CHCC)
16.0000 mg | Freq: Once | INTRAVENOUS | Status: AC
  Administered 2012-06-14: 16 mg via INTRAVENOUS

## 2012-06-14 MED ORDER — DEXAMETHASONE SODIUM PHOSPHATE 4 MG/ML IJ SOLN
20.0000 mg | Freq: Once | INTRAMUSCULAR | Status: AC
  Administered 2012-06-14: 20 mg via INTRAVENOUS

## 2012-06-14 MED ORDER — LEUCOVORIN CALCIUM INJECTION 350 MG
192.0000 mg/m2 | Freq: Once | INTRAVENOUS | Status: AC
  Administered 2012-06-14: 300 mg via INTRAVENOUS
  Filled 2012-06-14: qty 15

## 2012-06-14 NOTE — Telephone Encounter (Signed)
Patient requested assistance to help find out why his medications are no longer covered under his Medicaid plan per the [facility] pharmacy.  Patient stated he could not get his medications filled because he was told he needs to show his Medicare Part D card.  This RN contacted the pharmacy and confirmed he no longer has the drug coverage under Medicaid due to current coverage under Medicare Part D effective this month.  This RN informed patient and encouraged him to call his caseworker to obtain the Medicare Part D card and number.  This RN offered to call for the patient, but he stated he had the number at his home and that he would contact his caseworker.  This RN encouraged patient to contact this office if he needs any further assist.

## 2012-06-14 NOTE — Progress Notes (Signed)
[facility]    OFFICE PROGRESS NOTE   INTERVAL HISTORY:   He returns as scheduled. He completed another cycle of chemotherapy on 05/30/2012. He reports tolerating the chemotherapy well. No nausea, mouth sores, or neuropathy symptoms. No dyspnea. He continues to have abdominal discomfort when supine. Occasional diarrhea. He continues to work.  Objective:  Vital signs in last 24 hours:  Blood pressure 138/91, pulse 94, temperature 97.9 F (36.6 C), temperature source Oral, resp. rate 18, height 5' 4.5" (1.638 m), weight 136 lb 11.2 oz (62.007 kg).    HEENT: No thrush or ulcers Resp: Lungs clear bilaterally Cardio: Regular rate and rhythm GI: No hepatomegaly, diffuse tenderness to superficial palpation over the right abdomen. Firm tissue at the lower aspect of the midline wound and right lower quadrant scar. Vascular: No leg edema Neuro: Mild decrease in vibratory sense at the fingertips bilaterally  Skin: Mild hyperpigmentation and skin thickening over the hands   Portacath/PICC-without erythema  Lab Results:  Lab Results  Component Value Date   WBC 5.3 06/14/2012   HGB 12.8* 06/14/2012   HCT 37.9* 06/14/2012   MCV 100.0* 06/14/2012   PLT 220 06/14/2012   ANC 3.9    Medications: I have reviewed the patient's current medications.  Assessment/Plan: 1. Clinical stage III (uT3 uN1) rectal cancer status post pelvic radiation, August 24 through November 18, 2009. 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 There was no lymph node involvement (pT3 pN0). He began adjuvant CAPOX chemotherapy March 11, 2010. He completed the sixth and final cycle of CAPOX 06/25/2010. CT of the abdomen and pelvis 03/11/2011 confirmed  multiple nodules at the lung bases consistent with metastatic disease. 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 He completed cycle 1 of FOLFIRI/Avastin 04/29/2011. He completed cycle 3 05/27/2011. CEA was improved on 06/10/2011. He completed cycle 4 on 06/10/2011. He completed cycle 5 on 06/24/2011. He completed cycle 6 on 07/08/2011. Restaging CT evaluation 07/22/2011 showed a slight decrease in pulmonary nodules. He completed cycle 8 FOLFIRI/Avastin on 08/12/2011. He completed cycle 9 on 09/02/2011. Chest CT on 10/07/2011 showed increased size and number of lung nodules. CT scans 12/13/2011 with mild progression of pulmonary metastatic disease; interval resolution of left upper quadrant and central pelvic fluid collections; residual gas and fluid collection in the undersurface of the left lower quadrant ventral abdominal wall; similar appearance of soft tissue thickening and enhancement to the perirectal region; abnormal small bowel dilatation and moderate colonic stool burden consistent with ileus and/or constipation. He completed cycle 1 FOLFOXIRI on 01/12/2012; cycle 4 on 02/24/2012.The CEA was slightly lower on 02/24/2012. Restaging CT on 03/09/2012 without evidence of disease progression. He completed cycle 5 on 03/28/2012. The CEA was more elevated on 04/10/2012 (22.9 ). He completed cycle 6 FOLFOXIRI on 04/12/2012 ,cycle 7 on 04/26/2012, and cycling on 05/10/2012. CEA was improved on 05/10/2012. Restaging CT 05/19/2012 revealed stable lung nodules and stable postoperative changes in the abdomen/pelvis 2. Strangulated peristomal hernia and small bowel obstruction status post reduction of peristomal hernia, omentectomy, exploratory laparotomy, small bowel resection x2 on January 28, 2010.  3. History of rectal bleeding secondary to number 1.  4. History of an elevated CEA. Improved on 06/10/2011 and 07/29/2011. 5. History of a left  adrenal nodule.  6. Weight loss. Improved. 7. Right knee pain/effusion status post evaluation at [facility].  8. CT abdomen/pelvis 03/11/2011 with multiple nodules at the lung bases consistent with metastatic involvement of the lungs; prominent presacral soft tissue; air bubble within the presacral soft tissue appeared to be extraluminal and breakdown at the anastomotic site could not be excluded. No discrete abscess was seen. 9. Elevated blood pressure. Question effect of Avastin. He was started on Norvasc on 06/10/2011.  10. Frequent bowel movements. Likely related to multiple bowel resections. Imodium was not effective. 11. Suture at the ileostomy reversal scar. Suture removed during the recent hospitalization. 12. Erectile dysfunction. The testosterone level returned at 508 on 08/12/2011, he was referred to urology, but Medicaid would not pay for this visit. 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 Culture returned positive for abundant Escherichia coli. He completed a course of Augmentin.  14. Status post diverting loop descending colostomy 10/05/2011. 15. Status post multiple rectal biopsies 10/05/2011, all negative for malignancy. 16. Hospitalization 10/13/2011 with a bowel obstruction. Resolved. 17. Hospitalization 11/07/2011 with a recurrent small bowel obstruction. He was taken to the OR on 11/10/2011 and underwent an exploratory laparotomy with small bowel resection x2 and incidental appendectomy. He was found to have a closed loop obstruction. Pathology on #1 small bowel resection showed focal perforation with no evidence of malignancy; pathology on #2 small bowel resection showed metastatic adenocarcinoma consistent with colorectal primary; pathology on the appendix showed serosal fibrous adhesions with no evidence of malignancy.He developed postoperative respiratory failure, sepsis and renal insufficiency. He improved gradually and was  able to be extubated. He required placement of multiple percutaneous drains for intraperitoneal abscesses. He was discharged home on 12/02/2011. No remaining abscess on the CT 03/09/2012. The pelvic drain has been removed. 18. Deconditioning. Improved. 19. Low back and testicular pain when supine, tenderness of the right testicle. Improved. He did not complain of this pain at today's visit. 20. Diarrhea following cycle 1 FOLFOXIRI. 21. Pain at previous pelvic drain site, ileostomy scar and the lower aspect of the midline scar. He takes Percocet as needed. ? 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 The plan is to proceed with another cycle of FOLFOXIRI today. He will return for an office visit and chemotherapy in 2 weeks. We will followup on the CEA from today. He requested we check a testosterone level to evaluate erectile dysfunction.  Thornton Papas, MD  06/14/2012  10:09 AM

## 2012-06-14 NOTE — Telephone Encounter (Signed)
gv pt appt schedule for May. °

## 2012-06-14 NOTE — Progress Notes (Signed)
Patient called his case worker to obtain his Medicare D information since he does not yet have his card: Jeffery [name] [id number] [employer] ID [id number] [id number] Faxed information to [facility]

## 2012-06-14 NOTE — Telephone Encounter (Signed)
Met with patient.  He received his supplies from [place], but stated he only received 5 bags.  He would like to know when his next shipment will occur.  This RN left a voice message with the [employer] coordinator, Janelle Floor, at [phone], to please contact the patient.  Patient's phone number, name and DOB were given.  Patient was also given the contact name and number at [facility].

## 2012-06-15 ENCOUNTER — Telehealth: Payer: Self-pay | Admitting: *Deleted

## 2012-06-15 ENCOUNTER — Encounter: Payer: Self-pay | Admitting: Oncology

## 2012-06-15 DIAGNOSIS — C20 Malignant neoplasm of rectum: Secondary | ICD-10-CM

## 2012-06-15 MED ORDER — PROCHLORPERAZINE MALEATE 10 MG PO TABS: 10.0000 mg | ORAL_TABLET | Freq: Four times a day (QID) | ORAL | Status: DC | PRN

## 2012-06-15 MED ORDER — ONDANSETRON HCL 8 MG PO TABS: 8.0000 mg | ORAL_TABLET | Freq: Two times a day (BID) | ORAL | Status: DC | PRN

## 2012-06-15 NOTE — Progress Notes (Signed)
[place], [phone], approved ondansetron 8mg  from 06/15/12-02/14/13.

## 2012-06-15 NOTE — Telephone Encounter (Signed)
Reports nausea--girlfriend got him some Dramamine, but not very helpful. Will call in Compazine to [facility] for him.

## 2012-06-16 ENCOUNTER — Ambulatory Visit (HOSPITAL_BASED_OUTPATIENT_CLINIC_OR_DEPARTMENT_OTHER): Payer: Medicare Other

## 2012-06-16 VITALS — BP 127/80 | HR 99 | Temp 97.8°F

## 2012-06-16 DIAGNOSIS — C7800 Secondary malignant neoplasm of unspecified lung: Secondary | ICD-10-CM

## 2012-06-16 DIAGNOSIS — Z5111 Encounter for antineoplastic chemotherapy: Secondary | ICD-10-CM

## 2012-06-16 DIAGNOSIS — C20 Malignant neoplasm of rectum: Secondary | ICD-10-CM

## 2012-06-16 MED ORDER — SODIUM CHLORIDE 0.9 % IJ SOLN
10.0000 mL | INTRAMUSCULAR | Status: DC | PRN
  Administered 2012-06-16: 10 mL
  Filled 2012-06-16: qty 10

## 2012-06-16 MED ORDER — HEPARIN SOD (PORK) LOCK FLUSH 100 UNIT/ML IV SOLN
500.0000 [IU] | Freq: Once | INTRAVENOUS | Status: AC | PRN
  Administered 2012-06-16: 500 [IU]
  Filled 2012-06-16: qty 5

## 2012-06-16 NOTE — Patient Instructions (Signed)
Call MD for problems or concerns 

## 2012-06-28 ENCOUNTER — Other Ambulatory Visit (HOSPITAL_BASED_OUTPATIENT_CLINIC_OR_DEPARTMENT_OTHER): Payer: Medicare Other | Admitting: Lab

## 2012-06-28 ENCOUNTER — Inpatient Hospital Stay: Payer: Medicare Other

## 2012-06-28 ENCOUNTER — Ambulatory Visit (HOSPITAL_BASED_OUTPATIENT_CLINIC_OR_DEPARTMENT_OTHER): Payer: Medicare Other | Admitting: Oncology

## 2012-06-28 VITALS — BP 135/91 | HR 103 | Temp 97.8°F | Resp 18 | Ht 64.5 in | Wt 130.1 lb

## 2012-06-28 DIAGNOSIS — C20 Malignant neoplasm of rectum: Secondary | ICD-10-CM

## 2012-06-28 DIAGNOSIS — R197 Diarrhea, unspecified: Secondary | ICD-10-CM

## 2012-06-28 DIAGNOSIS — G620 Drug-induced polyneuropathy: Secondary | ICD-10-CM

## 2012-06-28 DIAGNOSIS — C7800 Secondary malignant neoplasm of unspecified lung: Secondary | ICD-10-CM

## 2012-06-28 LAB — COMPREHENSIVE METABOLIC PANEL (CC13)
AST: 22 U/L (ref 5–34)
Alkaline Phosphatase: 79 U/L (ref 40–150)
BUN: 10.3 mg/dL (ref 7.0–26.0)
Calcium: 8.9 mg/dL (ref 8.4–10.4)
Creatinine: 1.1 mg/dL (ref 0.7–1.3)

## 2012-06-28 LAB — CBC WITH DIFFERENTIAL/PLATELET
BASO%: 0.3 % (ref 0.0–2.0)
EOS%: 1 % (ref 0.0–7.0)
HCT: 38.3 % — ABNORMAL LOW (ref 38.4–49.9)
LYMPH%: 14 % (ref 14.0–49.0)
MCH: 33.8 pg — ABNORMAL HIGH (ref 27.2–33.4)
MCHC: 33.7 g/dL (ref 32.0–36.0)
MCV: 100.3 fL — ABNORMAL HIGH (ref 79.3–98.0)
MONO%: 11.3 % (ref 0.0–14.0)
NEUT%: 73.4 % (ref 39.0–75.0)
Platelets: 215 10*3/uL (ref 140–400)

## 2012-06-28 MED ORDER — OXYCODONE-ACETAMINOPHEN 10-325 MG PO TABS: 1.0000 | ORAL_TABLET | Freq: Four times a day (QID) | ORAL | Status: DC | PRN

## 2012-06-28 MED ORDER — POTASSIUM CHLORIDE CRYS ER 20 MEQ PO TBCR: 20.0000 meq | EXTENDED_RELEASE_TABLET | Freq: Every day | ORAL | Status: DC

## 2012-06-28 NOTE — Progress Notes (Signed)
[facility]    OFFICE PROGRESS NOTE   INTERVAL HISTORY:   He returns as scheduled. He completed another cycle of chemotherapy on 06/14/2012. He tolerated the chemotherapy well. He reports a few episodes of diarrhea several days ago. He also developed mild delayed nausea. Mr. Zietz continues to have pain at the right abdomen surgical sites. He takes approximately 3 oxycodone tablets per day for relief of pain. He continues to work. No neuropathy symptoms.   Objective:  Vital signs in last 24 hours:  Blood pressure 135/91, pulse 103, temperature 97.8 F (36.6 C), temperature source Oral, resp. rate 18, height 5' 4.5" (1.638 m), weight 130 lb 1.6 oz (59.013 kg), SpO2 99.00%.    HEENT: no thrush or ulcers Resp: lungs clear bilaterally Cardio: regular rate and rhythm GI: no hepatomegaly, tender to superficial palpation at the right abdomen ostomy site scar and over the lower midline scar. No discrete mass. Vascular: no leg edema Neuro:mild decrease in vibratory sense at the fingertips     Portacath/PICC-without erythema  Lab Results:  Lab Results  Component Value Date   WBC 4.8 06/28/2012   HGB 12.9* 06/28/2012   HCT 38.3* 06/28/2012   MCV 100.3* 06/28/2012   PLT 215 06/28/2012  ANC 3.5  CEA on 06/14/2012-12.7  Medications: I have reviewed the patient's current medications.  Assessment/Plan: 1. Clinical stage III (uT3 uN1) rectal cancer status post pelvic radiation, August 24 through November 18, 2009. 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 There was no lymph node involvement (pT3 pN0). He began adjuvant CAPOX chemotherapy March 11, 2010. He completed the sixth and final cycle of CAPOX 06/25/2010. CT  of the abdomen and pelvis 03/11/2011 confirmed multiple nodules at the lung bases consistent with metastatic disease. 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 He completed cycle 1 of FOLFIRI/Avastin 04/29/2011. He completed cycle 3 05/27/2011. CEA was improved on 06/10/2011. He completed cycle 4 on 06/10/2011. He completed cycle 5 on 06/24/2011. He completed cycle 6 on 07/08/2011. Restaging CT evaluation 07/22/2011 showed a slight decrease in pulmonary nodules. He completed cycle 8 FOLFIRI/Avastin on 08/12/2011. He completed cycle 9 on 09/02/2011. Chest CT on 10/07/2011 showed increased size and number of lung nodules. CT scans 12/13/2011 with mild progression of pulmonary metastatic disease; interval resolution of left upper quadrant and central pelvic fluid collections; residual gas and fluid collection in the undersurface of the left lower quadrant ventral abdominal wall; similar appearance of soft tissue thickening and enhancement to the perirectal region; abnormal small bowel dilatation and moderate colonic stool burden consistent with ileus and/or constipation. He completed cycle 1 FOLFOXIRI on 01/12/2012; cycle 4 on 02/24/2012.The CEA was slightly lower on 02/24/2012. Restaging CT on 03/09/2012 without evidence of disease progression. He completed cycle 5 on 03/28/2012. The CEA was more elevated on 04/10/2012 (22.9 ). He completed cycle 6 FOLFOXIRI on 04/12/2012 ,cycle 7 on 04/26/2012, and cycle 8 on 05/10/2012. CEA was improved on 05/10/2012. Restaging CT 05/19/2012 revealed stable lung nodules and stable postoperative changes in the abdomen/pelvis, stable CEA 06/14/2012 2. Strangulated peristomal hernia and small bowel obstruction status post reduction of peristomal hernia, omentectomy, exploratory laparotomy, small bowel resection x2 on January 28, 2010.  3. History of rectal bleeding secondary to number 1.  4. History of  an elevated  CEA. Improved on 06/10/2011 and 07/29/2011. 5. History of a left adrenal nodule.  6. Weight loss. Improved. 7. Right knee pain/effusion status post evaluation at [facility].  8. CT abdomen/pelvis 03/11/2011 with multiple nodules at the lung bases consistent with metastatic involvement of the lungs; prominent presacral soft tissue; air bubble within the presacral soft tissue appeared to be extraluminal and breakdown at the anastomotic site could not be excluded. No discrete abscess was seen. 9. Elevated blood pressure. Question effect of Avastin. He was started on Norvasc on 06/10/2011.  10. Frequent bowel movements. Likely related to multiple bowel resections. Imodium was not effective. 11. Suture at the ileostomy reversal scar. Suture removed during the recent hospitalization. 12. Erectile dysfunction. The testosterone level returned at 508 on 08/12/2011, he was referred to urology, but Medicaid would not pay for this visit. 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 Culture returned positive for abundant Escherichia coli. He completed a course of Augmentin.  14. Status post diverting loop descending colostomy 10/05/2011. 15. Status post multiple rectal biopsies 10/05/2011, all negative for malignancy. 16. Hospitalization 10/13/2011 with a bowel obstruction. Resolved. 17. Hospitalization 11/07/2011 with a recurrent small bowel obstruction. He was taken to the OR on 11/10/2011 and underwent an exploratory laparotomy with small bowel resection x2 and incidental appendectomy. He was found to have a closed loop obstruction. Pathology on #1 small bowel resection showed focal perforation with no evidence of malignancy; pathology on #2 small bowel resection showed metastatic adenocarcinoma consistent with colorectal primary; pathology on the appendix showed serosal fibrous adhesions with no evidence of malignancy.He developed postoperative respiratory  failure, sepsis and renal insufficiency. He improved gradually and was able to be extubated. He required placement of multiple percutaneous drains for intraperitoneal abscesses. He was discharged home on 12/02/2011. No remaining abscess on the CT 03/09/2012. The pelvic drain has been removed. 18. Deconditioning. Improved. 19. Low back and testicular pain when supine, tenderness of the right testicle. Improved. He did not complain of this pain at today's visit. 20. Diarrhea following cycle 1 FOLFOXIRI. 21. Pain at previous pelvic drain site, ileostomy scar and the lower aspect of the midline scar. He takes Percocet as needed. ? neuropathic pain 22. 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 Mr. Ricciuti will try Imodium for the diarrhea since his insurance will not cover the Lomotil. We refilled a prescription for Percocet today.  He requested a delay in the next cycle of chemotherapy until 07/03/2012. He will be scheduled for an office visit and chemotherapy on 07/19/2012.   Thornton Papas, MD  06/28/2012  11:29 AM

## 2012-06-29 ENCOUNTER — Telehealth: Payer: Self-pay | Admitting: *Deleted

## 2012-06-29 NOTE — Telephone Encounter (Signed)
Patient requested assistance from [facility] at the 06/28/12 MD visit and stated he needs more supplies for his ostomy.  He stated [facility] has assisted in the past, but that he has run out of what he was sent and he needs more supplies.  This RN left a voice message with the [employer] coordinator, Janelle Floor, at [phone], to please contact the patient.  Patient's phone number, name and DOB were given along with this RN's name and phone number.

## 2012-06-30 ENCOUNTER — Telehealth: Payer: Self-pay | Admitting: *Deleted

## 2012-06-30 ENCOUNTER — Telehealth: Payer: Self-pay | Admitting: Oncology

## 2012-06-30 NOTE — Telephone Encounter (Signed)
Received VM from [facility] rep., Janelle Floor [phone], stated they would reach out to [facility] today and have them contact patient.  She confirmed patient's name and contact information.

## 2012-06-30 NOTE — Telephone Encounter (Signed)
Talked to pt and gave him appt for May 2014 chemo advised pt to get appt calendar for June 2014

## 2012-06-30 NOTE — Telephone Encounter (Signed)
Per staff message and POF I have scheduled appts.  Per Misty Stanley ok to move from 5/19 to 5/21.JMW

## 2012-07-03 ENCOUNTER — Telehealth: Payer: Self-pay | Admitting: Oncology

## 2012-07-03 NOTE — Telephone Encounter (Signed)
Gave pt appt calendar for May and June 2014

## 2012-07-04 ENCOUNTER — Other Ambulatory Visit: Payer: Self-pay | Admitting: *Deleted

## 2012-07-04 ENCOUNTER — Telehealth: Payer: Self-pay | Admitting: *Deleted

## 2012-07-04 ENCOUNTER — Other Ambulatory Visit: Payer: Self-pay | Admitting: Oncology

## 2012-07-04 DIAGNOSIS — C20 Malignant neoplasm of rectum: Secondary | ICD-10-CM

## 2012-07-04 NOTE — Telephone Encounter (Signed)
Called pt on cell phone and spoke with girlfriend Liborio Nixon.  Instructed Liborio Nixon to relay message to pt to come in at 0930 am for lab on 07/05/12 prior to chemo appt.  Liborio Nixon voiced understanding and stated she would relay message to pt.

## 2012-07-05 ENCOUNTER — Other Ambulatory Visit: Payer: Medicare Other | Admitting: Lab

## 2012-07-05 ENCOUNTER — Ambulatory Visit (HOSPITAL_BASED_OUTPATIENT_CLINIC_OR_DEPARTMENT_OTHER): Payer: Medicare Other

## 2012-07-05 VITALS — BP 136/87 | HR 93 | Temp 99.0°F | Resp 18

## 2012-07-05 DIAGNOSIS — Z5111 Encounter for antineoplastic chemotherapy: Secondary | ICD-10-CM

## 2012-07-05 DIAGNOSIS — C20 Malignant neoplasm of rectum: Secondary | ICD-10-CM

## 2012-07-05 DIAGNOSIS — C7800 Secondary malignant neoplasm of unspecified lung: Secondary | ICD-10-CM

## 2012-07-05 MED ORDER — IRINOTECAN HCL CHEMO INJECTION 100 MG/5ML
128.0000 mg/m2 | Freq: Once | INTRAVENOUS | Status: AC
  Administered 2012-07-05: 200 mg via INTRAVENOUS
  Filled 2012-07-05: qty 10

## 2012-07-05 MED ORDER — SODIUM CHLORIDE 0.9 % IV SOLN
2400.0000 mg/m2 | INTRAVENOUS | Status: DC
  Administered 2012-07-05: 3750 mg via INTRAVENOUS
  Filled 2012-07-05: qty 75

## 2012-07-05 MED ORDER — SODIUM CHLORIDE 0.9 % IV SOLN
Freq: Once | INTRAVENOUS | Status: AC
  Administered 2012-07-05: 14:00:00 via INTRAVENOUS

## 2012-07-05 MED ORDER — ATROPINE SULFATE 1 MG/ML IJ SOLN
0.5000 mg | Freq: Once | INTRAMUSCULAR | Status: AC | PRN
  Administered 2012-07-05: 0.5 mg via INTRAVENOUS

## 2012-07-05 MED ORDER — ONDANSETRON 16 MG/50ML IVPB (CHCC)
16.0000 mg | Freq: Once | INTRAVENOUS | Status: AC
  Administered 2012-07-05: 16 mg via INTRAVENOUS

## 2012-07-05 MED ORDER — OXALIPLATIN CHEMO INJECTION 100 MG/20ML
85.0000 mg/m2 | Freq: Once | INTRAVENOUS | Status: AC
  Administered 2012-07-05: 135 mg via INTRAVENOUS
  Filled 2012-07-05: qty 27

## 2012-07-05 MED ORDER — DEXTROSE 5 % IV SOLN
Freq: Once | INTRAVENOUS | Status: AC
  Administered 2012-07-05: 11:00:00 via INTRAVENOUS

## 2012-07-05 MED ORDER — DEXAMETHASONE SODIUM PHOSPHATE 20 MG/5ML IJ SOLN
20.0000 mg | Freq: Once | INTRAMUSCULAR | Status: AC
  Administered 2012-07-05: 20 mg via INTRAVENOUS

## 2012-07-05 MED ORDER — LEUCOVORIN CALCIUM INJECTION 350 MG
192.0000 mg/m2 | Freq: Once | INTRAVENOUS | Status: AC
  Administered 2012-07-05: 300 mg via INTRAVENOUS
  Filled 2012-07-05: qty 15

## 2012-07-05 NOTE — Progress Notes (Signed)
07/04/12  -  Per Dr. Truett Perna,  No need to repeat labs before chemo today 07/05/12.

## 2012-07-05 NOTE — Patient Instructions (Addendum)
[facility] Discharge Instructions for Patients Receiving Chemotherapy  Today you received the following chemotherapy agents :  Oxaliplatin, Camptosar, Leucovorin, 5FU.  To help prevent nausea and vomiting after your treatment, we encourage you to take your nausea medication as instructed by your physician.    If you develop nausea and vomiting that is not controlled by your nausea medication, call the clinic. If it is after clinic hours your family physician or the after hours number for the clinic or go to the Emergency Department.   BELOW ARE SYMPTOMS THAT SHOULD BE REPORTED IMMEDIATELY:  *FEVER GREATER THAN 100.5 F  *CHILLS WITH OR WITHOUT FEVER  NAUSEA AND VOMITING THAT IS NOT CONTROLLED WITH YOUR NAUSEA MEDICATION  *UNUSUAL SHORTNESS OF BREATH  *UNUSUAL BRUISING OR BLEEDING  TENDERNESS IN MOUTH AND THROAT WITH OR WITHOUT PRESENCE OF ULCERS  *URINARY PROBLEMS  *BOWEL PROBLEMS  UNUSUAL RASH Items with * indicate a potential emergency and should be followed up as soon as possible.  One of the nurses will contact you 24 hours after your treatment. Please let the nurse know about any problems that you may have experienced. Feel free to call the clinic you have any questions or concerns. The clinic phone number is [phone].   I have been informed and understand all the instructions given to me. I know to contact the clinic, my physician, or go to the Emergency Department if any problems should occur. I do not have any questions at this time, but understand that I may call the clinic during office hours   should I have any questions or need assistance in obtaining follow up care.    __________________________________________  _____________  __________ Signature of Patient or Authorized Representative            Date                   Time    __________________________________________ Nurse's Signature

## 2012-07-07 ENCOUNTER — Ambulatory Visit (HOSPITAL_BASED_OUTPATIENT_CLINIC_OR_DEPARTMENT_OTHER): Payer: Medicare Other

## 2012-07-07 VITALS — BP 138/94 | HR 84 | Temp 97.5°F

## 2012-07-07 DIAGNOSIS — C20 Malignant neoplasm of rectum: Secondary | ICD-10-CM

## 2012-07-07 MED ORDER — SODIUM CHLORIDE 0.9 % IJ SOLN
10.0000 mL | INTRAMUSCULAR | Status: DC | PRN
  Administered 2012-07-07: 10 mL
  Filled 2012-07-07: qty 10

## 2012-07-07 MED ORDER — HEPARIN SOD (PORK) LOCK FLUSH 100 UNIT/ML IV SOLN
500.0000 [IU] | Freq: Once | INTRAVENOUS | Status: AC | PRN
  Administered 2012-07-07: 500 [IU]
  Filled 2012-07-07: qty 5

## 2012-07-14 ENCOUNTER — Telehealth: Payer: Self-pay | Admitting: Dietician

## 2012-07-14 NOTE — Telephone Encounter (Signed)
Brief Outpatient Oncology Nutrition Note  Patient has been identified to be at risk on malnutrition screen.  Wt Readings from Last 10 Encounters:  06/28/12 130 lb 1.6 oz (59.013 kg)  06/14/12 136 lb 11.2 oz (62.007 kg)  05/23/12 133 lb 6.4 oz (60.51 kg)  05/09/12 134 lb (60.782 kg)  04/25/12 131 lb 9.6 oz (59.693 kg)  04/10/12 128 lb 9.6 oz (58.333 kg)  03/23/12 126 lb 6.4 oz (57.335 kg)  03/22/12 123 lb 11.2 oz (56.11 kg)  02/24/12 121 lb 6.4 oz (55.067 kg)  02/08/12 122 lb 6.4 oz (55.52 kg)    Called and spoke with patient's girlfriend.  Patient to call with any nutrition related questions.  Contact information for [facility] provided.  Will mail patient coupons for Ensure as he continues to use this.  Oran Rein, RD, LDN

## 2012-07-16 ENCOUNTER — Other Ambulatory Visit: Payer: Self-pay | Admitting: Oncology

## 2012-07-17 ENCOUNTER — Telehealth: Payer: Self-pay | Admitting: Nutrition

## 2012-07-17 NOTE — Telephone Encounter (Signed)
Patient contacted me to receive additional cases of Ensure Plus. Patient has received his limit of 3 total cases of Ensure Plus. I have contacted patient to let him know and will send coupons for him in the mail so that he can purchase this product at a reduced cost.

## 2012-07-19 ENCOUNTER — Telehealth: Payer: Self-pay | Admitting: *Deleted

## 2012-07-19 ENCOUNTER — Other Ambulatory Visit (HOSPITAL_BASED_OUTPATIENT_CLINIC_OR_DEPARTMENT_OTHER): Payer: Medicare Other | Admitting: Lab

## 2012-07-19 ENCOUNTER — Ambulatory Visit: Payer: Medicare Other

## 2012-07-19 ENCOUNTER — Telehealth: Payer: Self-pay | Admitting: Oncology

## 2012-07-19 ENCOUNTER — Ambulatory Visit (HOSPITAL_BASED_OUTPATIENT_CLINIC_OR_DEPARTMENT_OTHER): Payer: Medicare Other | Admitting: Nurse Practitioner

## 2012-07-19 VITALS — BP 132/87 | HR 76 | Temp 97.2°F | Resp 18 | Ht 64.0 in | Wt 129.1 lb

## 2012-07-19 DIAGNOSIS — R109 Unspecified abdominal pain: Secondary | ICD-10-CM

## 2012-07-19 DIAGNOSIS — C7800 Secondary malignant neoplasm of unspecified lung: Secondary | ICD-10-CM

## 2012-07-19 DIAGNOSIS — C20 Malignant neoplasm of rectum: Secondary | ICD-10-CM

## 2012-07-19 DIAGNOSIS — G620 Drug-induced polyneuropathy: Secondary | ICD-10-CM

## 2012-07-19 LAB — CBC WITH DIFFERENTIAL/PLATELET
BASO%: 0.2 % (ref 0.0–2.0)
EOS%: 0.8 % (ref 0.0–7.0)
HCT: 39.3 % (ref 38.4–49.9)
LYMPH%: 15.4 % (ref 14.0–49.0)
MCH: 33.8 pg — ABNORMAL HIGH (ref 27.2–33.4)
MCHC: 34.1 g/dL (ref 32.0–36.0)
MCV: 99.2 fL — ABNORMAL HIGH (ref 79.3–98.0)
MONO%: 11.9 % (ref 0.0–14.0)
NEUT%: 71.7 % (ref 39.0–75.0)
Platelets: 242 10*3/uL (ref 140–400)
lymph#: 0.8 10*3/uL — ABNORMAL LOW (ref 0.9–3.3)

## 2012-07-19 MED ORDER — OXYCODONE-ACETAMINOPHEN 10-325 MG PO TABS: 1.0000 | ORAL_TABLET | Freq: Four times a day (QID) | ORAL | Status: DC | PRN

## 2012-07-19 MED ORDER — LORAZEPAM 0.5 MG PO TABS: ORAL_TABLET | ORAL | Status: DC

## 2012-07-19 NOTE — Telephone Encounter (Signed)
Per staff message and POF I have scheduled appts.  JMW  

## 2012-07-19 NOTE — Progress Notes (Signed)
OFFICE PROGRESS NOTE  Interval history:  Jeffery [name] returns as scheduled. He completed cycle 11 FOLFOXIRI on 07/05/2012. He denies nausea/vomiting. No mouth sores. He continues to have intermittent loose stools. No numbness or tingling in his hands or feet. Several nights ago he noted his urine was brown. Urine subsequently returned to normal color. He denies dysuria. He continues to have intermittent right-sided abdominal pain. He takes oxycodone typically twice a day.   Objective: Blood pressure 132/87, pulse 76, temperature 97.2 F (36.2 C), temperature source Oral, resp. rate 18, height 5\' 4"  (1.626 m), weight 129 lb 1.6 oz (58.559 kg).  Oropharynx is without thrush or ulceration. Lungs are clear. Regular cardiac rhythm. Port-A-Cath site is without erythema. Abdomen is soft. Tender to superficial palpation at the right abdomen. No discrete mass. Extremities without edema. Mild decrease in vibratory sense at the fingertips.  Lab Results: Lab Results  Component Value Date   WBC 5.1 07/19/2012   HGB 13.4 07/19/2012   HCT 39.3 07/19/2012   MCV 99.2* 07/19/2012   PLT 242 07/19/2012    Chemistry:    Chemistry      Component Value Date/Time   NA 140 06/28/2012 0823   NA 136 12/02/2011 0600   K 3.6 06/28/2012 0823   K 4.1 12/02/2011 0600   CL 105 06/28/2012 0823   CL 100 12/02/2011 0600   CO2 26 06/28/2012 0823   CO2 29 12/02/2011 0600   BUN 10.3 06/28/2012 0823   BUN 9 12/02/2011 0600   CREATININE 1.1 06/28/2012 0823   CREATININE 0.64 12/02/2011 0600      Component Value Date/Time   CALCIUM 8.9 06/28/2012 0823   CALCIUM 8.3* 12/02/2011 0600   ALKPHOS 79 06/28/2012 0823   ALKPHOS 58 11/29/2011 0625   AST 22 06/28/2012 0823   AST 13 11/29/2011 0625   ALT 19 06/28/2012 0823   ALT 13 11/29/2011 0625   BILITOT 0.66 06/28/2012 0823   BILITOT 0.3 11/29/2011 0625       Studies/Results: No results found.  Medications: I have reviewed the patient's current  medications.  Assessment/Plan:  1. Clinical stage III (uT3 uN1) rectal cancer status post pelvic radiation, August 24 through November 18, 2009. 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 There was no lymph node involvement (pT3 pN0). He began adjuvant CAPOX chemotherapy March 11, 2010. He completed the sixth and final cycle of CAPOX 06/25/2010. CT of the abdomen and pelvis 03/11/2011 confirmed multiple nodules at the lung bases consistent with metastatic disease. 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 He completed cycle 1 of FOLFIRI/Avastin 04/29/2011. He completed cycle 3 05/27/2011. CEA was improved on 06/10/2011. He completed cycle 4 on 06/10/2011. He completed cycle 5 on 06/24/2011. He completed cycle 6 on 07/08/2011. Restaging CT evaluation 07/22/2011 showed a slight decrease in pulmonary nodules. He completed cycle 8 FOLFIRI/Avastin on 08/12/2011. He completed cycle 9 on 09/02/2011. Chest CT on 10/07/2011 showed increased size and number of lung nodules. CT scans 12/13/2011 with mild progression of pulmonary metastatic disease; interval resolution of left upper quadrant and central pelvic fluid collections; residual gas and fluid collection in the undersurface of the left lower quadrant ventral abdominal wall; similar appearance of soft tissue thickening and enhancement to the perirectal region; abnormal small bowel dilatation and  moderate colonic stool burden consistent with ileus and/or constipation. He completed cycle 1 FOLFOXIRI on 01/12/2012; cycle 4 on 02/24/2012.The CEA was slightly lower on 02/24/2012. Restaging CT on 03/09/2012 without evidence of disease progression. He  completed cycle 5 on 03/28/2012. The CEA was more elevated on 04/10/2012 (22.9 ). He completed cycle 6 FOLFOXIRI on 04/12/2012 ,cycle 7 on 04/26/2012, and cycle 8 on 05/10/2012. CEA was improved on 05/10/2012. Restaging CT 05/19/2012 revealed stable lung nodules and stable postoperative changes in the abdomen/pelvis, stable CEA 06/14/2012. CEA stable on 06/28/2012. He completed cycle 11 FOLFOXIRI on 07/05/2012. 2. Strangulated peristomal hernia and small bowel obstruction status post reduction of peristomal hernia, omentectomy, exploratory laparotomy, small bowel resection x2 on January 28, 2010.  3. History of rectal bleeding secondary to number 1.  4. History of an elevated CEA. Improved on 06/10/2011 and 07/29/2011. 5. History of a left adrenal nodule.  6. Weight loss. Improved. 7. Right knee pain/effusion status post evaluation at [facility].  8. CT abdomen/pelvis 03/11/2011 with multiple nodules at the lung bases consistent with metastatic involvement of the lungs; prominent presacral soft tissue; air bubble within the presacral soft tissue appeared to be extraluminal and breakdown at the anastomotic site could not be excluded. No discrete abscess was seen. 9. Elevated blood pressure. Question effect of Avastin. He was started on Norvasc on 06/10/2011.  10. Frequent bowel movements. Likely related to multiple bowel resections. Imodium was not effective. 11. Suture at the ileostomy reversal scar. Suture removed during the recent hospitalization. 12. Erectile dysfunction. The testosterone level returned at 508 on 08/12/2011, he was referred to urology, but Medicaid would not pay for this visit. 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 Culture returned positive for abundant Escherichia coli. He completed a course of Augmentin.  14. Status post diverting loop descending colostomy 10/05/2011. 15. Status post multiple rectal biopsies 10/05/2011, all  negative for malignancy. 16. Hospitalization 10/13/2011 with a bowel obstruction. Resolved. 17. Hospitalization 11/07/2011 with a recurrent small bowel obstruction. He was taken to the OR on 11/10/2011 and underwent an exploratory laparotomy with small bowel resection x2 and incidental appendectomy. He was found to have a closed loop obstruction. Pathology on #1 small bowel resection showed focal perforation with no evidence of malignancy; pathology on #2 small bowel resection showed metastatic adenocarcinoma consistent with colorectal primary; pathology on the appendix showed serosal fibrous adhesions with no evidence of malignancy.He developed postoperative respiratory failure, sepsis and renal insufficiency. He improved gradually and was able to be extubated. He required placement of multiple percutaneous drains for intraperitoneal abscesses. He was discharged home on 12/02/2011. No remaining abscess on the CT 03/09/2012. The pelvic drain has been removed. 18. Deconditioning. Improved. 19. Low back and testicular pain when supine, tenderness of the right testicle. Improved. He did not complain of this pain at today's visit. 20. Diarrhea following cycle 1 FOLFOXIRI. 21. Pain at previous pelvic drain site, ileostomy scar and the lower aspect of the midline scar. He takes Percocet as needed. ? neuropathic pain. 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   Disposition-[name] appears stable. He has completed 11 cycles of FOLFOXIRI. He is scheduled for cycle 12 today. He is unable to stay for treatment and will reschedule the appointment. We are referring him for restaging CT scans after cycle 12. We will see him in followup on 08/08/2012. He will contact the office in the interim with any problems.  Plan reviewed with Dr. Truett Perna.  Ned Card ANP/GNP-BC

## 2012-07-19 NOTE — Telephone Encounter (Signed)
gv and printed appt sched and avs...MW added tx. °

## 2012-07-26 ENCOUNTER — Other Ambulatory Visit: Payer: Self-pay | Admitting: *Deleted

## 2012-07-26 ENCOUNTER — Encounter: Payer: Self-pay | Admitting: *Deleted

## 2012-07-26 ENCOUNTER — Ambulatory Visit (HOSPITAL_BASED_OUTPATIENT_CLINIC_OR_DEPARTMENT_OTHER): Payer: Medicare Other

## 2012-07-26 ENCOUNTER — Other Ambulatory Visit (HOSPITAL_BASED_OUTPATIENT_CLINIC_OR_DEPARTMENT_OTHER): Payer: Medicare Other | Admitting: Lab

## 2012-07-26 VITALS — BP 152/94 | HR 81 | Temp 97.5°F | Resp 16

## 2012-07-26 DIAGNOSIS — C20 Malignant neoplasm of rectum: Secondary | ICD-10-CM

## 2012-07-26 DIAGNOSIS — C7800 Secondary malignant neoplasm of unspecified lung: Secondary | ICD-10-CM

## 2012-07-26 DIAGNOSIS — K56609 Unspecified intestinal obstruction, unspecified as to partial versus complete obstruction: Secondary | ICD-10-CM

## 2012-07-26 DIAGNOSIS — Z5111 Encounter for antineoplastic chemotherapy: Secondary | ICD-10-CM

## 2012-07-26 LAB — CBC WITH DIFFERENTIAL/PLATELET
BASO%: 1.1 % (ref 0.0–2.0)
Eosinophils Absolute: 0.1 10*3/uL (ref 0.0–0.5)
LYMPH%: 11.9 % — ABNORMAL LOW (ref 14.0–49.0)
MCHC: 34.6 g/dL (ref 32.0–36.0)
MONO#: 1 10*3/uL — ABNORMAL HIGH (ref 0.1–0.9)
NEUT#: 4.7 10*3/uL (ref 1.5–6.5)
Platelets: 237 10*3/uL (ref 140–400)
RBC: 3.91 10*6/uL — ABNORMAL LOW (ref 4.20–5.82)
WBC: 6.6 10*3/uL (ref 4.0–10.3)
lymph#: 0.8 10*3/uL — ABNORMAL LOW (ref 0.9–3.3)

## 2012-07-26 LAB — COMPREHENSIVE METABOLIC PANEL (CC13)
ALT: 13 U/L (ref 0–55)
Albumin: 3.4 g/dL — ABNORMAL LOW (ref 3.5–5.0)
CO2: 26 mEq/L (ref 22–29)
Calcium: 9.3 mg/dL (ref 8.4–10.4)
Chloride: 104 mEq/L (ref 98–107)
Glucose: 100 mg/dl — ABNORMAL HIGH (ref 70–99)
Potassium: 3.6 mEq/L (ref 3.5–5.1)
Sodium: 139 mEq/L (ref 136–145)
Total Protein: 7.6 g/dL (ref 6.4–8.3)

## 2012-07-26 MED ORDER — DEXTROSE 5 % IV SOLN
192.0000 mg/m2 | Freq: Once | INTRAVENOUS | Status: AC
  Administered 2012-07-26: 300 mg via INTRAVENOUS
  Filled 2012-07-26: qty 15

## 2012-07-26 MED ORDER — ATROPINE SULFATE 0.4 MG/ML IJ SOLN
0.5000 mg | Freq: Once | INTRAMUSCULAR | Status: AC | PRN
  Administered 2012-07-26: 0.5 mg via INTRAVENOUS
  Filled 2012-07-26: qty 1.25

## 2012-07-26 MED ORDER — DEXTROSE 5 % IV SOLN
Freq: Once | INTRAVENOUS | Status: AC
  Administered 2012-07-26: 09:00:00 via INTRAVENOUS

## 2012-07-26 MED ORDER — IRINOTECAN HCL CHEMO INJECTION 100 MG/5ML
128.0000 mg/m2 | Freq: Once | INTRAVENOUS | Status: AC
  Administered 2012-07-26: 200 mg via INTRAVENOUS
  Filled 2012-07-26: qty 10

## 2012-07-26 MED ORDER — DEXTROSE 5 % IV SOLN
85.0000 mg/m2 | Freq: Once | INTRAVENOUS | Status: AC
  Administered 2012-07-26: 135 mg via INTRAVENOUS
  Filled 2012-07-26: qty 27

## 2012-07-26 MED ORDER — SODIUM CHLORIDE 0.9 % IV SOLN
2400.0000 mg/m2 | INTRAVENOUS | Status: DC
  Administered 2012-07-26: 3750 mg via INTRAVENOUS
  Filled 2012-07-26: qty 75

## 2012-07-26 MED ORDER — OXYCODONE-ACETAMINOPHEN 5-325 MG PO TABS
1.0000 | ORAL_TABLET | Freq: Once | ORAL | Status: AC
  Administered 2012-07-26: 1 via ORAL

## 2012-07-26 MED ORDER — DEXAMETHASONE SODIUM PHOSPHATE 20 MG/5ML IJ SOLN
20.0000 mg | Freq: Once | INTRAMUSCULAR | Status: AC
  Administered 2012-07-26: 20 mg via INTRAVENOUS

## 2012-07-26 MED ORDER — ONDANSETRON 16 MG/50ML IVPB (CHCC)
16.0000 mg | Freq: Once | INTRAVENOUS | Status: AC
  Administered 2012-07-26: 16 mg via INTRAVENOUS

## 2012-07-26 NOTE — Progress Notes (Signed)
Spoke with patient regarding letter received about [employer] will no longer cover his Lomotil after 30 days. Request trying OTC Loperamide instead. Patient has tried this in past with minimal help. He reports that the Lomotil is not a lot more effective for his system than the loperamide. He is willing to go back to OTC med when his script is finished and will let us know if he can't control his loose stools with this. At that time we can contact insurance company to request a prior authorization. Gave form letter to HIM to scan.

## 2012-07-26 NOTE — Progress Notes (Addendum)
Discharged at 1345 ambulatory in no distress.  Does not drive and has called for his transportation.  To return for pump d/c at 11:30 am on 07-28-2012

## 2012-07-26 NOTE — Progress Notes (Signed)
Patient was medicated with one percocet at 8:55 am.  At 10:0 am patient sleeping lightly, easily arousable.  Reports the pain has resolved and can feel a little ache to left side but no further sharp pains.

## 2012-07-26 NOTE — Progress Notes (Signed)
Jeffery [name] c/o pain to left side at 8:40 am.  Notified Lonna Cobb NP and received verbal order for percocet.  Administered 1 tab percocet 5/325 mg at 8:55 am.  Patient sitting in recliner with eyes closed.  No movement, trying to relax.

## 2012-07-26 NOTE — Patient Instructions (Signed)
[facility] Discharge Instructions for Patients Receiving Chemotherapy  Today you received the following chemotherapy agents Oxaliplatin, Leucovorin, Irinotecan Flourouracil.  To help prevent nausea and vomiting after your treatment, we encourage you to take your nausea medication Compazine, zofran, ativan as ordered by Dr. Truett Perna.   If you develop nausea and vomiting that is not controlled by your nausea medication, call the clinic.   BELOW ARE SYMPTOMS THAT SHOULD BE REPORTED IMMEDIATELY:  *FEVER GREATER THAN 100.5 F  *CHILLS WITH OR WITHOUT FEVER  NAUSEA AND VOMITING THAT IS NOT CONTROLLED WITH YOUR NAUSEA MEDICATION  *UNUSUAL SHORTNESS OF BREATH  *UNUSUAL BRUISING OR BLEEDING  TENDERNESS IN MOUTH AND THROAT WITH OR WITHOUT PRESENCE OF ULCERS  *URINARY PROBLEMS  *BOWEL PROBLEMS  UNUSUAL RASH Items with * indicate a potential emergency and should be followed up as soon as possible.  Feel free to call the clinic you have any questions or concerns. The clinic phone number is [phone].

## 2012-07-28 ENCOUNTER — Ambulatory Visit (HOSPITAL_BASED_OUTPATIENT_CLINIC_OR_DEPARTMENT_OTHER): Payer: Medicare Other

## 2012-07-28 VITALS — BP 123/83 | HR 82 | Temp 97.1°F | Resp 18

## 2012-07-28 DIAGNOSIS — C20 Malignant neoplasm of rectum: Secondary | ICD-10-CM

## 2012-07-28 MED ORDER — HEPARIN SOD (PORK) LOCK FLUSH 100 UNIT/ML IV SOLN
500.0000 [IU] | Freq: Once | INTRAVENOUS | Status: AC | PRN
  Administered 2012-07-28: 500 [IU]
  Filled 2012-07-28: qty 5

## 2012-07-28 MED ORDER — SODIUM CHLORIDE 0.9 % IJ SOLN
10.0000 mL | INTRAMUSCULAR | Status: DC | PRN
  Administered 2012-07-28: 10 mL
  Filled 2012-07-28: qty 10

## 2012-07-28 NOTE — Patient Instructions (Signed)
Call MD for problems or concerns 

## 2012-08-02 ENCOUNTER — Other Ambulatory Visit: Payer: Self-pay | Admitting: *Deleted

## 2012-08-02 DIAGNOSIS — C7800 Secondary malignant neoplasm of unspecified lung: Secondary | ICD-10-CM

## 2012-08-02 DIAGNOSIS — C20 Malignant neoplasm of rectum: Secondary | ICD-10-CM

## 2012-08-02 MED ORDER — OXYCODONE-ACETAMINOPHEN 10-325 MG PO TABS: 1.0000 | ORAL_TABLET | Freq: Four times a day (QID) | ORAL | Status: DC | PRN

## 2012-08-03 ENCOUNTER — Ambulatory Visit (HOSPITAL_COMMUNITY)
Admission: RE | Admit: 2012-08-03 | Discharge: 2012-08-03 | Disposition: A | Discharge status: Home / Self Care | Payer: Medicare Other | Source: Ambulatory Visit | Attending: Nurse Practitioner | Admitting: Nurse Practitioner

## 2012-08-03 ENCOUNTER — Encounter (HOSPITAL_COMMUNITY): Payer: Self-pay

## 2012-08-03 DIAGNOSIS — K7689 Other specified diseases of liver: Secondary | ICD-10-CM | POA: Insufficient documentation

## 2012-08-03 DIAGNOSIS — D739 Disease of spleen, unspecified: Secondary | ICD-10-CM | POA: Insufficient documentation

## 2012-08-03 DIAGNOSIS — C7800 Secondary malignant neoplasm of unspecified lung: Secondary | ICD-10-CM | POA: Insufficient documentation

## 2012-08-03 DIAGNOSIS — C20 Malignant neoplasm of rectum: Secondary | ICD-10-CM

## 2012-08-03 DIAGNOSIS — K6389 Other specified diseases of intestine: Secondary | ICD-10-CM | POA: Insufficient documentation

## 2012-08-03 MED ORDER — IOHEXOL 300 MG/ML  SOLN
100.0000 mL | Freq: Once | INTRAMUSCULAR | Status: AC | PRN
  Administered 2012-08-03: 100 mL via INTRAVENOUS

## 2012-08-04 NOTE — Telephone Encounter (Signed)
e

## 2012-08-06 ENCOUNTER — Other Ambulatory Visit: Payer: Self-pay | Admitting: Oncology

## 2012-08-08 ENCOUNTER — Ambulatory Visit: Payer: Medicare Other | Admitting: Nurse Practitioner

## 2012-08-08 ENCOUNTER — Other Ambulatory Visit: Payer: Medicare Other | Admitting: Lab

## 2012-08-09 ENCOUNTER — Ambulatory Visit (HOSPITAL_BASED_OUTPATIENT_CLINIC_OR_DEPARTMENT_OTHER): Payer: Medicare Other | Admitting: Lab

## 2012-08-09 ENCOUNTER — Ambulatory Visit: Payer: Medicare Other

## 2012-08-09 ENCOUNTER — Telehealth: Payer: Self-pay | Admitting: *Deleted

## 2012-08-09 ENCOUNTER — Ambulatory Visit: Payer: Medicare Other | Admitting: Oncology

## 2012-08-09 ENCOUNTER — Other Ambulatory Visit: Payer: Self-pay | Admitting: *Deleted

## 2012-08-09 ENCOUNTER — Telehealth: Payer: Self-pay | Admitting: Oncology

## 2012-08-09 DIAGNOSIS — C7800 Secondary malignant neoplasm of unspecified lung: Secondary | ICD-10-CM

## 2012-08-09 DIAGNOSIS — C20 Malignant neoplasm of rectum: Secondary | ICD-10-CM

## 2012-08-09 LAB — CBC WITH DIFFERENTIAL/PLATELET
Basophils Absolute: 0 10*3/uL (ref 0.0–0.1)
EOS%: 0.6 % (ref 0.0–7.0)
HCT: 36.1 % — ABNORMAL LOW (ref 38.4–49.9)
HGB: 12.6 g/dL — ABNORMAL LOW (ref 13.0–17.1)
LYMPH%: 10.5 % — ABNORMAL LOW (ref 14.0–49.0)
MCH: 34.3 pg — ABNORMAL HIGH (ref 27.2–33.4)
MCV: 98.7 fL — ABNORMAL HIGH (ref 79.3–98.0)
NEUT%: 77.2 % — ABNORMAL HIGH (ref 39.0–75.0)
Platelets: 248 10*3/uL (ref 140–400)
lymph#: 0.8 10*3/uL — ABNORMAL LOW (ref 0.9–3.3)

## 2012-08-09 NOTE — Telephone Encounter (Signed)
per susan Dr. Truett Perna wants to see pt tomorrow at 10:30am

## 2012-08-09 NOTE — Progress Notes (Signed)
Per Dr. Truett Perna, do not treat today. Make patient an MD appointment and see me tomorrow. Patient verbalized understanding.

## 2012-08-09 NOTE — Telephone Encounter (Signed)
Per staff message and POF I have scheduled appts.  JMW  

## 2012-08-10 ENCOUNTER — Telehealth: Payer: Self-pay | Admitting: Oncology

## 2012-08-10 ENCOUNTER — Ambulatory Visit (HOSPITAL_BASED_OUTPATIENT_CLINIC_OR_DEPARTMENT_OTHER): Payer: Medicare Other | Admitting: Oncology

## 2012-08-10 ENCOUNTER — Ambulatory Visit (HOSPITAL_BASED_OUTPATIENT_CLINIC_OR_DEPARTMENT_OTHER): Payer: Medicare Other

## 2012-08-10 ENCOUNTER — Ambulatory Visit: Payer: Medicare Other | Admitting: Lab

## 2012-08-10 ENCOUNTER — Telehealth: Payer: Self-pay | Admitting: *Deleted

## 2012-08-10 VITALS — BP 142/98 | HR 103 | Temp 98.0°F | Resp 18 | Ht 64.0 in | Wt 129.8 lb

## 2012-08-10 DIAGNOSIS — C7800 Secondary malignant neoplasm of unspecified lung: Secondary | ICD-10-CM

## 2012-08-10 DIAGNOSIS — C20 Malignant neoplasm of rectum: Secondary | ICD-10-CM

## 2012-08-10 DIAGNOSIS — R109 Unspecified abdominal pain: Secondary | ICD-10-CM

## 2012-08-10 DIAGNOSIS — Z5111 Encounter for antineoplastic chemotherapy: Secondary | ICD-10-CM

## 2012-08-10 MED ORDER — ATROPINE SULFATE 0.4 MG/ML IJ SOLN
0.5000 mg | Freq: Once | INTRAMUSCULAR | Status: AC | PRN
  Administered 2012-08-10: 0.5 mg via INTRAVENOUS
  Filled 2012-08-10: qty 1.25

## 2012-08-10 MED ORDER — LORAZEPAM 0.5 MG PO TABS: ORAL_TABLET | ORAL | Status: DC

## 2012-08-10 MED ORDER — DEXTROSE 5 % IV SOLN
Freq: Once | INTRAVENOUS | Status: AC
  Administered 2012-08-10: 12:00:00 via INTRAVENOUS

## 2012-08-10 MED ORDER — OXYCODONE-ACETAMINOPHEN 10-325 MG PO TABS: 1.0000 | ORAL_TABLET | ORAL | Status: DC | PRN

## 2012-08-10 MED ORDER — SODIUM CHLORIDE 0.9 % IV SOLN
2400.0000 mg/m2 | INTRAVENOUS | Status: DC
  Administered 2012-08-10: 3750 mg via INTRAVENOUS
  Filled 2012-08-10: qty 75

## 2012-08-10 MED ORDER — LEUCOVORIN CALCIUM INJECTION 350 MG
192.0000 mg/m2 | Freq: Once | INTRAVENOUS | Status: AC
  Administered 2012-08-10: 300 mg via INTRAVENOUS
  Filled 2012-08-10: qty 15

## 2012-08-10 NOTE — Progress Notes (Signed)
[facility]    OFFICE PROGRESS NOTE   INTERVAL HISTORY:   Mr. Lerette returns as scheduled. He was last treated with FOLFIRINOX on 07/26/2012. He tolerated chemotherapy well. No neuropathy symptoms. He continues to have pain at the right low abdomen ostomy scar. He takes oxycodone for the pain. He continues to work 7 days per week.  Objective:  Vital signs in last 24 hours:  Blood pressure 142/98, pulse 103, temperature 98 F (36.7 C), temperature source Oral, resp. rate 18, height 5\' 4"  (1.626 m), weight 129 lb 12.8 oz (58.877 kg).    HEENT: No thrush or ulcers Resp: Lungs clear bilaterally Cardio: Regular rate and rhythm GI: No hepatomegaly, no mass, tender surrounding the right lower quadrant scar. Vascular: No leg edema  Skin: Hyperpigmentation of the hands   Portacath/PICC-without erythema  Lab Results:  Lab Results  Component Value Date   WBC 7.2 08/09/2012   HGB 12.6* 08/09/2012   HCT 36.1* 08/09/2012   MCV 98.7* 08/09/2012   PLT 248 08/09/2012   ANC 5.6 CEA 12.5 on 06/28/2012  X-rays: CTs of the chest, abdomen, and pelvis on 08/03/2012-diffuse pulmonary metastatic disease . Similar lesions have slightly enlarged compared to a CT 05/19/2012. No new lesions. New area of progressive tumor or mucoid impaction in the right lower lobe bronchus. A splenic lesion has enlarged. Progressive masslike soft tissue density in the right lower rectal area, 2.1 cm soft tissue lesion in the right lower mesentery   Medications: I have reviewed the patient's current medications.  Assessment/Plan: 1. Clinical stage III (uT3 uN1) rectal cancer status post pelvic radiation, August 24 through November 18, 2009. He underwent a laparoscopic-assisted low anterior resection with diverting loop ileostomy, January 14, 2010, with the final pathology showing invasive adenocarcinoma focally extending into the perirectal connective tissue, negative margins, extensive ulceration with  perforation and perirectal abscess. There was no lymph node involvement (pT3 pN0). He began adjuvant CAPOX chemotherapy March 11, 2010. He completed the sixth and final cycle of CAPOX 06/25/2010. CT of the abdomen and pelvis 03/11/2011 confirmed multiple nodules at the lung bases consistent with metastatic disease. 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 He completed cycle 1 of FOLFIRI/Avastin 04/29/2011. He completed cycle 3 05/27/2011. CEA was improved on 06/10/2011. He completed cycle 4 on 06/10/2011. He completed cycle 5 on 06/24/2011. He completed cycle 6 on 07/08/2011. Restaging CT evaluation 07/22/2011 showed a slight decrease in pulmonary nodules. He completed cycle 8 FOLFIRI/Avastin on 08/12/2011. He completed cycle 9 on 09/02/2011. Chest CT on 10/07/2011 showed increased size and number of lung nodules. CT scans 12/13/2011 with mild progression of pulmonary metastatic disease; interval resolution of left upper quadrant and central pelvic fluid collections; residual gas and fluid collection in the undersurface of the left lower quadrant ventral abdominal wall; similar appearance of soft tissue thickening and enhancement to the perirectal region; abnormal small bowel dilatation and moderate colonic stool burden consistent with ileus and/or constipation. He completed cycle 1 FOLFOXIRI on 01/12/2012; cycle 4 on 02/24/2012.The CEA was slightly lower on 02/24/2012. Restaging CT on 03/09/2012 without evidence of disease progression. He completed cycle 5 on 03/28/2012. The CEA was more elevated on 04/10/2012 (22.9 ). He completed cycle 6 FOLFOXIRI on 04/12/2012 ,cycle 7 on 04/26/2012, and cycle 8 on 05/10/2012. CEA was improved on 05/10/2012. Restaging CT 05/19/2012 revealed stable lung nodules and stable postoperative changes in the abdomen/pelvis, stable CEA 06/14/2012. CEA stable on 06/28/2012. He  completed cycle 12 FOLFOXIRI on  07/26/2012. -Restaging CT 08/03/2012 with enlargement of some of the lung nodules, increased perirectal mass, increased size of a splenic lesion 2. Strangulated peristomal hernia and small bowel obstruction status post reduction of peristomal hernia, omentectomy, exploratory laparotomy, small bowel resection x2 on January 28, 2010.  3. History of rectal bleeding secondary to number 1.  4. History of an elevated CEA. Improved on 06/10/2011 and 07/29/2011. 5. History of a left adrenal nodule.  6. Weight loss. Improved. 7. Right knee pain/effusion status post evaluation at [facility].  8. CT abdomen/pelvis 03/11/2011 with multiple nodules at the lung bases consistent with metastatic involvement of the lungs; prominent presacral soft tissue; air bubble within the presacral soft tissue appeared to be extraluminal and breakdown at the anastomotic site could not be excluded. No discrete abscess was seen. 9. Elevated blood pressure. Question effect of Avastin. He was started on Norvasc on 06/10/2011.  10. Frequent bowel movements. Likely related to multiple bowel resections. Imodium was not effective. 11. Suture at the ileostomy reversal scar. Suture removed during the recent hospitalization. 12. Erectile dysfunction. The testosterone level returned at 508 on 08/12/2011, he was referred to urology, but Medicaid would not pay for this visit. 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 Culture returned positive for abundant Escherichia coli. He completed a course of Augmentin.  14. Status post diverting loop descending colostomy 10/05/2011. 15. Status post multiple rectal biopsies 10/05/2011, all negative for malignancy. 16. Hospitalization 10/13/2011 with a bowel obstruction. Resolved. 17. Hospitalization 11/07/2011 with a recurrent small bowel obstruction. He was taken to the OR on 11/10/2011 and underwent an exploratory laparotomy with small bowel  resection x2 and incidental appendectomy. He was found to have a closed loop obstruction. Pathology on #1 small bowel resection showed focal perforation with no evidence of malignancy; pathology on #2 small bowel resection showed metastatic adenocarcinoma consistent with colorectal primary; pathology on the appendix showed serosal fibrous adhesions with no evidence of malignancy.He developed postoperative respiratory failure, sepsis and renal insufficiency. He improved gradually and was able to be extubated. He required placement of multiple percutaneous drains for intraperitoneal abscesses. He was discharged home on 12/02/2011. No remaining abscess on the CT 03/09/2012. The pelvic drain has been removed. 18. Deconditioning. Improved. 19. Low back and testicular pain when supine, tenderness of the right testicle. Improved. He did not complain of this pain at today's visit. 20. Diarrhea following cycle 1 FOLFOXIRI. 21. Pain at previous pelvic drain site, ileostomy scar and the lower aspect of the midline scar. He takes Percocet as needed. ? neuropathic pain. 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   Disposition:  Jeffery [name] has completed 12 cycles of FOLFIRINOX. He has tolerated the chemotherapy well and his clinical status is stable. The CEA is stable. The restaging CT on 08/03/2012 indicates minimal disease progression. I discussed treatment options with Mr. Hansell. We decided to continue systemic therapy with 5-FU/leucovorin. Irinotecan/oxaliplatin will be discontinued. I think it is unlikely he will achieve additional benefit from these agents.  He will return for an office visit and chemotherapy in 2 weeks. We will follow his clinical status and the CEA while on 5 fluorouracil.   Thornton Papas, MD  08/10/2012  6:33 PM

## 2012-08-10 NOTE — Progress Notes (Signed)
Patient reports he has not been taking his potassium on consistent basis-only couple times/week; "since my K+ was normal again I didn't think I needed to take it".

## 2012-08-10 NOTE — Telephone Encounter (Signed)
Per staff phone call I have adjusted appt for 7/23.    JMW

## 2012-08-10 NOTE — Telephone Encounter (Signed)
Called pt and and left message for appt for July 2014 lab, chemo and MD

## 2012-08-10 NOTE — Patient Instructions (Signed)
[facility] Discharge Instructions for Patients Receiving Chemotherapy  Today you received the following chemotherapy agents 5FU/Leucovorin  To help prevent nausea and vomiting after your treatment, we encourage you to take your nausea medication.   If you develop nausea and vomiting that is not controlled by your nausea medication, call the clinic.   BELOW ARE SYMPTOMS THAT SHOULD BE REPORTED IMMEDIATELY:  *FEVER GREATER THAN 100.5 F  *CHILLS WITH OR WITHOUT FEVER  NAUSEA AND VOMITING THAT IS NOT CONTROLLED WITH YOUR NAUSEA MEDICATION  *UNUSUAL SHORTNESS OF BREATH  *UNUSUAL BRUISING OR BLEEDING  TENDERNESS IN MOUTH AND THROAT WITH OR WITHOUT PRESENCE OF ULCERS  *URINARY PROBLEMS  *BOWEL PROBLEMS  UNUSUAL RASH Items with * indicate a potential emergency and should be followed up as soon as possible.  Feel free to call the clinic you have any questions or concerns. The clinic phone number is [phone].

## 2012-08-11 ENCOUNTER — Other Ambulatory Visit: Payer: Self-pay | Admitting: *Deleted

## 2012-08-12 ENCOUNTER — Other Ambulatory Visit: Payer: Self-pay

## 2012-08-12 ENCOUNTER — Ambulatory Visit (HOSPITAL_BASED_OUTPATIENT_CLINIC_OR_DEPARTMENT_OTHER): Payer: Medicare Other

## 2012-08-12 VITALS — BP 138/92 | HR 108 | Temp 98.7°F

## 2012-08-12 DIAGNOSIS — Z452 Encounter for adjustment and management of vascular access device: Secondary | ICD-10-CM

## 2012-08-12 DIAGNOSIS — C20 Malignant neoplasm of rectum: Secondary | ICD-10-CM

## 2012-08-12 DIAGNOSIS — C7800 Secondary malignant neoplasm of unspecified lung: Secondary | ICD-10-CM

## 2012-08-12 MED ORDER — HEPARIN SOD (PORK) LOCK FLUSH 100 UNIT/ML IV SOLN
500.0000 [IU] | Freq: Once | INTRAVENOUS | Status: AC | PRN
  Administered 2012-08-12: 500 [IU]
  Filled 2012-08-12: qty 5

## 2012-08-12 MED ORDER — SODIUM CHLORIDE 0.9 % IJ SOLN
10.0000 mL | INTRAMUSCULAR | Status: DC | PRN
  Administered 2012-08-12: 10 mL
  Filled 2012-08-12: qty 10

## 2012-08-20 ENCOUNTER — Other Ambulatory Visit: Payer: Self-pay | Admitting: Oncology

## 2012-08-22 ENCOUNTER — Telehealth: Payer: Self-pay | Admitting: Oncology

## 2012-08-22 ENCOUNTER — Encounter (HOSPITAL_COMMUNITY): Payer: Self-pay

## 2012-08-22 ENCOUNTER — Ambulatory Visit (HOSPITAL_COMMUNITY)
Admission: RE | Admit: 2012-08-22 | Discharge: 2012-08-22 | Disposition: A | Discharge status: Home / Self Care | Payer: Medicare Other | Source: Ambulatory Visit | Attending: Nurse Practitioner | Admitting: Nurse Practitioner

## 2012-08-22 ENCOUNTER — Other Ambulatory Visit (HOSPITAL_BASED_OUTPATIENT_CLINIC_OR_DEPARTMENT_OTHER): Payer: Medicare Other | Admitting: Lab

## 2012-08-22 ENCOUNTER — Ambulatory Visit (HOSPITAL_BASED_OUTPATIENT_CLINIC_OR_DEPARTMENT_OTHER): Payer: Medicare Other | Admitting: Nurse Practitioner

## 2012-08-22 VITALS — BP 134/86 | HR 98 | Temp 97.8°F | Resp 18 | Ht 64.0 in | Wt 128.9 lb

## 2012-08-22 DIAGNOSIS — R071 Chest pain on breathing: Secondary | ICD-10-CM

## 2012-08-22 DIAGNOSIS — R0781 Pleurodynia: Secondary | ICD-10-CM

## 2012-08-22 DIAGNOSIS — C20 Malignant neoplasm of rectum: Secondary | ICD-10-CM

## 2012-08-22 DIAGNOSIS — C7889 Secondary malignant neoplasm of other digestive organs: Secondary | ICD-10-CM | POA: Insufficient documentation

## 2012-08-22 DIAGNOSIS — C7800 Secondary malignant neoplasm of unspecified lung: Secondary | ICD-10-CM

## 2012-08-22 LAB — CBC WITH DIFFERENTIAL/PLATELET
Basophils Absolute: 0 10*3/uL (ref 0.0–0.1)
Eosinophils Absolute: 0 10*3/uL (ref 0.0–0.5)
HGB: 12.6 g/dL — ABNORMAL LOW (ref 13.0–17.1)
LYMPH%: 10.7 % — ABNORMAL LOW (ref 14.0–49.0)
MCV: 99.2 fL — ABNORMAL HIGH (ref 79.3–98.0)
MONO%: 11.5 % (ref 0.0–14.0)
NEUT#: 6 10*3/uL (ref 1.5–6.5)
Platelets: 375 10*3/uL (ref 140–400)
RBC: 3.75 10*6/uL — ABNORMAL LOW (ref 4.20–5.82)

## 2012-08-22 LAB — COMPREHENSIVE METABOLIC PANEL (CC13)
Alkaline Phosphatase: 62 U/L (ref 40–150)
BUN: 15.3 mg/dL (ref 7.0–26.0)
Glucose: 113 mg/dl (ref 70–140)
Total Bilirubin: 0.6 mg/dL (ref 0.20–1.20)

## 2012-08-22 MED ORDER — OXYCODONE-ACETAMINOPHEN 10-325 MG PO TABS: 1.0000 | ORAL_TABLET | ORAL | Status: DC | PRN

## 2012-08-22 MED ORDER — IOHEXOL 350 MG/ML SOLN
100.0000 mL | Freq: Once | INTRAVENOUS | Status: AC | PRN
  Administered 2012-08-22: 100 mL via INTRAVENOUS

## 2012-08-22 MED ORDER — LORAZEPAM 0.5 MG PO TABS: ORAL_TABLET | ORAL | Status: DC

## 2012-08-22 NOTE — Progress Notes (Addendum)
OFFICE PROGRESS NOTE  Interval history:  [name] returns as scheduled. He was treated with 5-FU/leucovorin on 08/10/2012. He has occasional mild nausea and diarrhea. No mouth sores. No hand or foot pain or redness. He notes numbness in the feet at nighttime.  He continues to have pain at the ileostomy takedown site. He takes Percocet as needed.  Last night he developed acute onset of pain at the left lower back. He also began coughing. His wife noted that he was short of breath walking into the office today. The pain worsens when he coughs or takes a deep breath. No fever. No leg swelling or calf pain.   Objective: Blood pressure 134/86, pulse 98, temperature 97.8 F (36.6 C), temperature source Oral, resp. rate 18, height 5\' 4"  (1.626 m), weight 128 lb 14.4 oz (58.469 kg).  Oropharynx is without thrush or ulceration. Question rub at the left lower posterior lung field. Regular cardiac rhythm. Port-A-Cath site without erythema. Abdomen soft. Tender around the ileostomy takedown site. Extremities without edema.  Lab Results: Lab Results  Component Value Date   WBC 7.7 08/22/2012   HGB 12.6* 08/22/2012   HCT 37.2* 08/22/2012   MCV 99.2* 08/22/2012   PLT 375 08/22/2012    Chemistry:    Chemistry      Component Value Date/Time   NA 140 08/22/2012 1152   NA 136 12/02/2011 0600   K 3.8 08/22/2012 1152   K 4.1 12/02/2011 0600   CL 104 07/26/2012 0744   CL 100 12/02/2011 0600   CO2 29 08/22/2012 1152   CO2 29 12/02/2011 0600   BUN 15.3 08/22/2012 1152   BUN 9 12/02/2011 0600   CREATININE 1.1 08/22/2012 1152   CREATININE 0.64 12/02/2011 0600      Component Value Date/Time   CALCIUM 9.4 08/22/2012 1152   CALCIUM 8.3* 12/02/2011 0600   ALKPHOS 62 08/22/2012 1152   ALKPHOS 58 11/29/2011 0625   AST 16 08/22/2012 1152   AST 13 11/29/2011 0625   ALT 12 08/22/2012 1152   ALT 13 11/29/2011 0625   BILITOT 0.60 08/22/2012 1152   BILITOT 0.3 11/29/2011 0625       Studies/Results: Ct Chest W  Contrast  08/03/2012   *RADIOLOGY REPORT*  Clinical Data:  Restaging metastatic rectal cancer.  CT CHEST, ABDOMEN AND PELVIS WITH CONTRAST  Technique:  Multidetector CT imaging of the chest, abdomen and pelvis was performed following the standard protocol during bolus administration of intravenous contrast.  Contrast: OMNIPAQUE IOHEXOL 300 MG/ML  SOLN  Comparison:  05/19/2012  CT CHEST  Findings:  The chest wall is unremarkable and stable.  The right- sided Port-A-Cath is unchanged.  No supraclavicular or axillary mass or adenopathy.  The bony thorax is intact.  No destructive bone lesions or spinal canal compromise.  The heart is normal in size.  No pericardial effusion.  No mediastinal or hilar lymphadenopathy.  The aorta is normal in caliber.  The esophagus is grossly normal.  Examination of the lung parenchyma demonstrates diffuse pulmonary metastatic disease.  The large right upper lobe lesion on image number 27 has enlarged slightly.  It previously measured 22 x 18 mm and now measures 25 x 20 mm.  The right lower lobe lesion on image number 35 measures 18 x 16 mm and previously measured 17 x 16 mm. There are two adjacent nodules in the left lower lobe on image number 46.  The more lateral lesion measures 12 x 12 mm and previously measured 8  x 8 mm.  No definite new lesions.  There is a new or progressive area of probable tumor in the right lower lobe bronchus.  Mucoid impaction is also possible.  IMPRESSION:  1.  Slight progression of metastatic disease involving the lungs. 2.  New or progressive endobronchial tumor or mucoid impaction in the right lower lobe bronchus.  CT ABDOMEN AND PELVIS  Findings:  The liver demonstrates mild diffuse fatty infiltration but no definite metastatic liver lesions.  No biliary dilatation. The splenic lesion has enlarged.  It previously measured 2.8 x 1.5 cm and now measures 4.2 x 2.3 cm.  The pancreas is unremarkable. The adrenal glands and kidneys are unremarkable and  stable.  Fairly significant dilatation of the colon with stool and air likely due to a diffuse colonic ileus.  The small bowel is grossly normal.  The stomach is distended with contrast fluid and food.  In the right lower quadrant suspicious there is a soft tissue lesion in the mesentery measuring approximately 21 mm.  There is also persistent marked rectal wall thickening and a progressive mass- like soft tissue density in the right lower rectal area on image number 109 which measures approximately 3.0 x 2.7 cm.  The bladder is unremarkable.  No inguinal mass or hernia.  The bony structures are intact.  No destructive bone lesions.  SI joint degenerative changes are stable.  IMPRESSION:  1.  Enlarging left splenic lesion. 2.  Persistent diffuse rectal wall thickening and slightly enlarging right perirectal soft tissue mass. 3.  No mesenteric or retroperitoneal adenopathy.  I suspect there is a 2 cm lesion in the right lower quadrant mesentery.   Original Report Authenticated By: Rudie Meyer, M.D.   Ct Abdomen Pelvis W Contrast  08/03/2012   *RADIOLOGY REPORT*  Clinical Data:  Restaging metastatic rectal cancer.  CT CHEST, ABDOMEN AND PELVIS WITH CONTRAST  Technique:  Multidetector CT imaging of the chest, abdomen and pelvis was performed following the standard protocol during bolus administration of intravenous contrast.  Contrast: OMNIPAQUE IOHEXOL 300 MG/ML  SOLN  Comparison:  05/19/2012  CT CHEST  Findings:  The chest wall is unremarkable and stable.  The right- sided Port-A-Cath is unchanged.  No supraclavicular or axillary mass or adenopathy.  The bony thorax is intact.  No destructive bone lesions or spinal canal compromise.  The heart is normal in size.  No pericardial effusion.  No mediastinal or hilar lymphadenopathy.  The aorta is normal in caliber.  The esophagus is grossly normal.  Examination of the lung parenchyma demonstrates diffuse pulmonary metastatic disease.  The large right upper lobe  lesion on image number 27 has enlarged slightly.  It previously measured 22 x 18 mm and now measures 25 x 20 mm.  The right lower lobe lesion on image number 35 measures 18 x 16 mm and previously measured 17 x 16 mm. There are two adjacent nodules in the left lower lobe on image number 46.  The more lateral lesion measures 12 x 12 mm and previously measured 8 x 8 mm.  No definite new lesions.  There is a new or progressive area of probable tumor in the right lower lobe bronchus.  Mucoid impaction is also possible.  IMPRESSION:  1.  Slight progression of metastatic disease involving the lungs. 2.  New or progressive endobronchial tumor or mucoid impaction in the right lower lobe bronchus.  CT ABDOMEN AND PELVIS  Findings:  The liver demonstrates mild diffuse fatty infiltration but no  definite metastatic liver lesions.  No biliary dilatation. The splenic lesion has enlarged.  It previously measured 2.8 x 1.5 cm and now measures 4.2 x 2.3 cm.  The pancreas is unremarkable. The adrenal glands and kidneys are unremarkable and stable.  Fairly significant dilatation of the colon with stool and air likely due to a diffuse colonic ileus.  The small bowel is grossly normal.  The stomach is distended with contrast fluid and food.  In the right lower quadrant suspicious there is a soft tissue lesion in the mesentery measuring approximately 21 mm.  There is also persistent marked rectal wall thickening and a progressive mass- like soft tissue density in the right lower rectal area on image number 109 which measures approximately 3.0 x 2.7 cm.  The bladder is unremarkable.  No inguinal mass or hernia.  The bony structures are intact.  No destructive bone lesions.  SI joint degenerative changes are stable.  IMPRESSION:  1.  Enlarging left splenic lesion. 2.  Persistent diffuse rectal wall thickening and slightly enlarging right perirectal soft tissue mass. 3.  No mesenteric or retroperitoneal adenopathy.  I suspect there is a 2 cm  lesion in the right lower quadrant mesentery.   Original Report Authenticated By: Rudie Meyer, M.D.    Medications: I have reviewed the patient's current medications.  Assessment/Plan:  1. Clinical stage III (uT3 uN1) rectal cancer status post pelvic radiation, August 24 through November 18, 2009. 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 There was no lymph node involvement (pT3 pN0). 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 He began adjuvant CAPOX chemotherapy March 11, 2010. He completed the sixth and final cycle of CAPOX 06/25/2010. CT of the abdomen and pelvis 03/11/2011 confirmed multiple nodules at the lung bases consistent with metastatic disease. 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 He completed cycle 1 of FOLFIRI/Avastin 04/29/2011. He completed cycle 3 05/27/2011. CEA was improved on 06/10/2011. He completed cycle 4 on 06/10/2011. He completed cycle 5 on 06/24/2011. He completed cycle 6 on 07/08/2011. Restaging CT evaluation 07/22/2011 showed a slight decrease in pulmonary nodules. He completed cycle 8 FOLFIRI/Avastin on 08/12/2011. He completed cycle 9 on 09/02/2011. Chest CT on 10/07/2011 showed increased size and number of lung nodules. CT scans 12/13/2011 with mild progression of pulmonary metastatic disease; interval resolution of left upper quadrant and central pelvic fluid collections; residual gas and fluid collection in the undersurface of the left lower quadrant ventral abdominal wall; similar appearance of soft tissue thickening and enhancement to the perirectal region; abnormal small bowel dilatation and moderate colonic stool burden consistent with ileus and/or  constipation. He completed cycle 1 FOLFOXIRI on 01/12/2012; cycle 4 on 02/24/2012.The CEA was slightly lower on 02/24/2012. Restaging CT on 03/09/2012 without evidence of disease progression. He completed cycle 5 on 03/28/2012. The CEA was more elevated on 04/10/2012 (22.9 ). He completed cycle 6 FOLFOXIRI on 04/12/2012 ,cycle 7 on 04/26/2012, and cycle 8 on 05/10/2012. CEA was improved on 05/10/2012. Restaging CT 05/19/2012 revealed stable lung nodules and stable postoperative changes in the abdomen/pelvis, stable CEA 06/14/2012. CEA stable on 06/28/2012. He completed cycle 12 FOLFOXIRI on 07/26/2012. -Restaging CT 08/03/2012 with enlargement of some of the lung nodules, increased perirectal mass, increased size of a splenic lesion. Irinotecan and oxaliplatin were discontinued. Systemic  therapy continued with 5-FU/leucovorin.  2. Strangulated peristomal hernia and small bowel obstruction status post reduction of peristomal hernia, omentectomy, exploratory laparotomy, small bowel resection x2 on January 28, 2010.  3. History of rectal bleeding secondary to number 1.  4. History of an elevated CEA. Improved on 06/10/2011 and 07/29/2011. 5. History of a left adrenal nodule.  6. Weight loss. Improved. 7. Right knee pain/effusion status post evaluation at [facility] [address].  8. CT abdomen/pelvis 03/11/2011 with multiple nodules at the lung bases consistent with metastatic involvement of the lungs; prominent presacral soft tissue; air bubble within the presacral soft tissue appeared to be extraluminal and breakdown at the anastomotic site could not be excluded. No discrete abscess was seen. 9. Elevated blood pressure. Question effect of Avastin. He was started on Norvasc on 06/10/2011.  10. Frequent bowel movements. Likely related to multiple bowel resections. Imodium was not effective. 11. Suture at the ileostomy reversal scar. Suture removed during the recent hospitalization. 12. Erectile dysfunction.  The testosterone level returned at 508 on 08/12/2011, he was referred to urology, but Medicaid would not pay for this visit. 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 Culture returned positive for abundant Escherichia coli. He completed a course of Augmentin.  14. Status post diverting loop descending colostomy 10/05/2011. 15. Status post multiple rectal biopsies 10/05/2011, all negative for malignancy. 16. Hospitalization 10/13/2011 with a bowel obstruction. Resolved. 17. Hospitalization 11/07/2011 with a recurrent small bowel obstruction. He was taken to the OR on 11/10/2011 and underwent an exploratory laparotomy with small bowel resection x2 and incidental appendectomy. He was found to have a closed loop obstruction. Pathology on #1 small bowel resection showed focal perforation with no evidence of malignancy; pathology on #2 small bowel resection showed metastatic adenocarcinoma consistent with colorectal primary; pathology on the appendix showed serosal fibrous adhesions with no evidence of malignancy.He developed postoperative respiratory failure, sepsis and renal insufficiency. He improved gradually and was able to be extubated. He required placement of multiple percutaneous drains for intraperitoneal abscesses. He was discharged home on 12/02/2011. No remaining abscess on the CT 03/09/2012. The pelvic drain has been removed. 18. Deconditioning. Improved. 19. Low back and testicular pain when supine, tenderness of the right testicle. Improved. He did not complain of this pain at today's visit. 20. Diarrhea following cycle 1 FOLFOXIRI. 21. Pain at previous pelvic drain site, ileostomy scar and the lower aspect of the midline scar. He takes Percocet as needed. ? neuropathic pain. 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 23. Acute onset pleuritic chest pain. We are referring him for an urgent chest CT to rule out a pulmonary  embolus.  Disposition-[name] developed acute onset of pleuritic chest pain last night. We are referring him for an urgent chest CT. He will return to the office following the scan to review the results.  Lonna Cobb ANP/GNP-BC    Addendum-the chest CT was negative for an acute pulmonary embolus. There was interval progression of the central right lower lobe tumor involving the right lower lobe bronchus. There was resultant progressive consolidation in the posterior right lower lobe. Other pulmonary nodules were not significantly changed. The splenic lesion was increased with a current measurement of 3.4 x 4.5 cm as compared to 2.3 x 4.27 m previously. There were no aggressive lytic or sclerotic bone lesions identified.  Dr. Truett Perna reviewed the CT scan images on the computer with [name] and his wife. There appears to be a progressive pleural-based  mass versus splenic lesion causing the pain. We are making a referral to radiation oncology. He was given a new prescription for Percocet.  Dr. Truett Perna recommends discontinuation of the 5-fluorouracil. We discussed a trial of Xeloda versus a referral for consideration of enrollment on a clinical trial.  [name] would like to proceed with radiation initially and return for a followup visit in 2 weeks to discuss systemic therapy.  He will return for a followup visit on 09/05/2012.    Addendum #2  I interviewed and examined [name]. I reviewed the chest CT with him. He has progressive metastatic rectal cancer. He presents today with pleuritic pain in the left chest. The pain appears to be related to a pleural-based versus splenic mass. This mass has enlarged compared to the CT 08/03/2012.  Systemic treatment options are very limited. His case was presented at the GI tumor conference on 08/16/2012. We decided to discontinue further systemic therapy for now. He will be referred for palliative radiation to the left splenic mass. [name]  will return for an office visit in 2 weeks.

## 2012-08-22 NOTE — Progress Notes (Addendum)
GI Location of Tumor / Histology:Small intestine  Patient presented on  11/07/2011 with recurrent Small Bowel obstruction; 1st on 10/08/11  Biopsies of Small Intestine (if applicable) revealed:   1. Small intestine, resection - FOCAL PERFORATION. - NO EVIDENCE OF MALIGNANCY. 2. Small intestine, resection - METASTATIC ADENOCARCINOMA CONSISTENT WITH COLORECTAL PRIMARY. 3. Appendix, Other than Incidental - SEROSAL FIBROUS ADHESIONS. - NO EVIDENCE OF MALIGNANCY.  Past/Anticipated interventions by surgeon, if any: Small Bowel Resection on 11/10/11.  Developed post operative respiratory failure, sepsis, and renal insuffiency  Past/Anticipated interventions by medical oncology, if any: Oxaliplatin, FOLFIRINOX, 5-FU/ Leucovorin  Weight changes, if any: 2 lbs loss past month  Bowel/Bladder complaints, if any: Mild diarrhea with 5-fu/Leucovorin  Nausea / Vomiting, if any: Mild Nausea with 5-fu/Leucovorin  Pain issues, if any:  08/22/12 - New onset of pain in the left lower back with SOB and coughing  SAFETY ISSUES:  Prior radiation : 10/08/09 through 11/18/2009 :  Posterior Pelvis / 4500 cCy / 25 fractions with boost of 540 cGy / 3 fractions  Pacemaker/ICD?No  Possible current pregnancy? Na  Is the patient on methotrexate? No  Current Complaints / other details:  Peripheral neuropathy - numbness in his feet and  08/22/12 - New onset of pain in the left lower back with SOB and coughing. Pain today 7/10, dull ache in left lower back area hear rib cage. Pt denies other pain today but states he takes Percocet 3 x daily for pain in his right abdomen. He attributes this pain to having 5 surgeries in this area and states he has scar tissue. Pt denies fatigue, loss of appetite, nausea, urinary/bowel issues. Reports SOB w/exertion and cough. 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

## 2012-08-22 NOTE — Telephone Encounter (Signed)
Pt sent to CT today, precert to [facility]

## 2012-08-22 NOTE — Telephone Encounter (Signed)
gv and printed appt sched and avs for pt...S.w. Jeffery [name] and she will call me with d.t. That Dr. Basilio Cairo can see pt.Marland KitchenMarland Kitchen

## 2012-08-22 NOTE — Telephone Encounter (Signed)
Pt cancelled appt for 7/10, she did not have surgery yet and she will call to r/s

## 2012-08-23 ENCOUNTER — Encounter: Payer: Self-pay | Admitting: Radiation Oncology

## 2012-08-23 ENCOUNTER — Ambulatory Visit
Admission: RE | Admit: 2012-08-23 | Discharge: 2012-08-23 | Disposition: A | Discharge status: Home / Self Care | Payer: Medicare Other | Source: Ambulatory Visit | Attending: Radiation Oncology | Admitting: Radiation Oncology

## 2012-08-23 ENCOUNTER — Ambulatory Visit: Payer: Medicare Other

## 2012-08-23 ENCOUNTER — Telehealth: Payer: Self-pay | Admitting: *Deleted

## 2012-08-23 VITALS — BP 116/83 | HR 103 | Temp 98.3°F | Resp 20 | Wt 127.8 lb

## 2012-08-23 DIAGNOSIS — C7800 Secondary malignant neoplasm of unspecified lung: Secondary | ICD-10-CM

## 2012-08-23 DIAGNOSIS — C261 Malignant neoplasm of spleen: Secondary | ICD-10-CM

## 2012-08-23 DIAGNOSIS — M549 Dorsalgia, unspecified: Secondary | ICD-10-CM | POA: Insufficient documentation

## 2012-08-23 DIAGNOSIS — Z510 Encounter for antineoplastic radiation therapy: Secondary | ICD-10-CM | POA: Insufficient documentation

## 2012-08-23 DIAGNOSIS — Z932 Ileostomy status: Secondary | ICD-10-CM | POA: Insufficient documentation

## 2012-08-23 DIAGNOSIS — C7889 Secondary malignant neoplasm of other digestive organs: Secondary | ICD-10-CM | POA: Insufficient documentation

## 2012-08-23 DIAGNOSIS — Z9221 Personal history of antineoplastic chemotherapy: Secondary | ICD-10-CM | POA: Insufficient documentation

## 2012-08-23 DIAGNOSIS — C20 Malignant neoplasm of rectum: Secondary | ICD-10-CM | POA: Insufficient documentation

## 2012-08-23 DIAGNOSIS — I10 Essential (primary) hypertension: Secondary | ICD-10-CM | POA: Insufficient documentation

## 2012-08-23 NOTE — Telephone Encounter (Signed)
Received phone call from [facility] requesting patient be referred due to his diagnosis,  age and family history.  Message given to Dr. Truett Perna.

## 2012-08-23 NOTE — Progress Notes (Signed)
Please see the Nurse Progress Note in the MD Initial Consult Encounter for this patient. 

## 2012-08-23 NOTE — Addendum Note (Signed)
Encounter addended by: Raniyah Curenton Mintz Salli Bodin, RN on: 08/23/2012  1:57 PM<BR>     Documentation filed: Charges VN

## 2012-08-23 NOTE — Progress Notes (Signed)
  Radiation Oncology         [phone] ________________________________  Name: Jeffery [name] MRN: [id number]  Date: 08/23/2012  DOB: 11/14/1960  SIMULATION AND TREATMENT PLANNING NOTE  outpatient  DIAGNOSIS:  Splenic and lung metastases  NARRATIVE:  The patient was brought to the CT Simulation planning suite.  Identity was confirmed.  All relevant records and images related to the planned course of therapy were reviewed.  The patient freely provided informed written consent to proceed with treatment after reviewing the details related to the planned course of therapy. The consent form was witnessed and verified by the simulation staff.    Then, the patient was set-up in a stable reproducible  supine position for radiation therapy - vaclock for legs, wingboard for arms.  CT images were obtained.  Surface markings were placed.  The CT images were loaded into the planning software.    RESPIRATORY MOTION MANAGEMENT SIMULATION  NARRATIVE:  In order to account for effect of respiratory motion on target structures and other organs in the planning and delivery of radiotherapy, this patient underwent respiratory motion management simulation.  To accomplish this, when the patient was brought to the CT simulation planning suite, 4D respiratoy motion management CT images were obtained.  The CT images were loaded into the planning software.  Then, using a variety of tools including Cine, MIP, and standard views, the target volume and planning target volumes (PTV) were delineated.  Avoidance structures were contoured.  Treatment planning then occurred.     TREATMENT PLANNING NOTE: Treatment planning then occurred.  The radiation prescription was entered and confirmed.    A total of 7 medically necessary complex treatment devices were fabricated and supervised by me - vaclock, and 6 fields with MLCs to block the following structures. I have requested : 3D Simulation  I have requested a DVH of the following  structures: spleen, stomach, target volumes, esophagus, cord, heart, lung.     The patient will receive 30Gy in 10 fractions to his RLL mass and splenic mass. Both sites will be treated with 3 field arrangements.   -----------------------------------  Lonie Peak, MD

## 2012-08-23 NOTE — Progress Notes (Signed)
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 Rectal Cancer Primary.  HISTORY OF PRESENT ILLNESS::Jeffery [name] is a 52 y.o. male who I initially saw in August 2011 for T3, N1, M0 adenocarcinoma of the rectum. He received neoadjuvant chemoradiation -total radiotherapy dose was 50.4 Gray in 28 fractions to the pelvis (45 Gy + 5.4 Gy boost). Unfortunately, he had biopsy proven right lower lobe lung metastasis in March 2013. He also underwent several rectal biopsies in August 2013 but these were benign. He developed small bowel obstruction and had a small intestine resection on 11/10/2011 which revealed metastatic adenocarcinoma. He help now has an ileostomy bag.   The patient has received multiple forms of systemic therapy since developing metastatic disease but reports that Dr.  Truett Perna stopped his chemotherapy so that it is not given concurrently with radiotherapy; medical oncology notes mention that he will return for followup in about 2 weeks to discuss resuming systemic therapy after radiotherapy. This could possibly be Xeloda versus a clinical trial.  CT imaging from yesterday revealed enlarging splenic tumor where he has pain, and right lower lung /bronchial tumor progression with consolidation. He reports 7/10 pain that is deep in his left abdomen/left lateral back at the site of his spleen. This has been going on for at least a week. He reports a rough nonproductive cough for one month as well as some mild shortness of breath. 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  He reports his ileostomy output is stable and denies abdominal cramping.  PREVIOUS RADIATION THERAPY: Yes as  above  PAST MEDICAL HISTORY:  has a past medical history of Hypertension; Cancer; ADENOCARCINOMA, RECTUM, [id number] (09/19/2009); and 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  (10/12/2011).    PAST SURGICAL HISTORY: Past Surgical History  Procedure Laterality Date  . Multiple tooth extractions      9/11  . Portacath placement  04/12/2011    Procedure: INSERTION PORT-A-CATH;  Surgeon: Almond Lint, MD;  Location: [facility];  Service: General;  Laterality: N/A;  port a cath insertion  . Colon surgery  12/11    sbo  . Colon surgery  8/12    colon resection-iliostomy  . Colon surgery  2012    iliostomy takedown  . Colostomy  10/05/2011    Procedure: COLOSTOMY;  Surgeon: Almond Lint, MD;  Location: [facility];  Service: General;  Laterality: N/A;  open colostomy  . Examination under anesthesia  10/05/2011    Procedure: EXAM UNDER ANESTHESIA;  Surgeon: Almond Lint, MD;  Location: [facility];  Service: General;  Laterality: N/A;  exam under anesthesia  . Proctoscopy  10/05/2011    Procedure: PROCTOSCOPY;  Surgeon: Almond Lint, MD;  Location: [facility];  Service: General;  Laterality: N/A;  rigid proctoscopy  . Laparoscopy  10/05/2011    Procedure: LAPAROSCOPY DIAGNOSTIC;  Surgeon: Almond Lint, MD;  Location: [facility];  Service: General;  Laterality: N/A;  . Laparotomy  11/10/2011    Procedure: EXPLORATORY LAPAROTOMY;  Surgeon: Almond Lint, MD;  Location: [facility];  Service: General;  Laterality: N/A;  SMALL BOWEL RESECTION x 2  . Application of wound vac  11/10/2011    Procedure: APPLICATION OF WOUND VAC;  Surgeon: Almond Lint, MD;  Location: [facility];  Service: General;;    FAMILY HISTORY: family history includes Cancer in his mother.  SOCIAL HISTORY:  reports that he has been smoking Cigarettes.  He has a 3 pack-year smoking history. He has never used smokeless tobacco. He reports that  drinks alcohol. He reports that he does not use illicit drugs.  ALLERGIES: Review of patient's allergies  indicates no known allergies.  MEDICATIONS:  Current Outpatient Prescriptions  Medication Sig Dispense Refill  . acetaminophen (TYLENOL) 325 MG tablet Take 1-2 tablets (325-650 mg total) by mouth every 6 (six) hours as needed for pain or fever.  80 tablet  3  . amLODipine (NORVASC) 10 MG tablet Take 1 tablet (10 mg total) by mouth daily.  30 tablet  3  . diphenoxylate-atropine (LOMOTIL) 2.5-0.025 MG per tablet Take 1 tablet by mouth 4 (four) times daily as needed for diarrhea or loose stools.  30 tablet  2  . lidocaine-prilocaine (EMLA) cream Apply topically as needed. Apply to [facility] site 1-2 hours prior to stick and cover with plastic wrap  30 g  prn  . LORazepam (ATIVAN) 0.5 MG tablet Take 1 to 2 tablets by mouth at bedtime as needed for sleep  40 tablet  0  . ondansetron (ZOFRAN) 8 MG tablet Take 1 tablet (8 mg total) by mouth 2 (two) times daily as needed for nausea.  20 tablet  0  . oxyCODONE-acetaminophen (PERCOCET) 10-325 MG per tablet Take 1 tablet by mouth every 4 (four) hours as needed for pain.  100 tablet  0  . potassium chloride SA (K-DUR,KLOR-CON) 20 MEQ tablet Take 1 tablet (20 mEq total) by mouth daily. Take one tablet by mouth twice daily for 3 days then take one PO daily.  30 tablet  2  . prochlorperazine (COMPAZINE) 10 MG tablet Take 1 tablet (10 mg total) by mouth every 6 (six) hours as needed (nausea).  60 tablet  1   No current facility-administered medications for this encounter.    REVIEW OF SYSTEMS:  As above   PHYSICAL EXAM:  weight is 127 lb 12.8 oz (57.97 kg). His oral temperature is 98.3 F (36.8 C). His blood pressure is 116/83 and his pulse is 103. His respiration is 20 and oxygen saturation is 95%.   General: Alert and oriented, in no acute distress HEENT: Head is normocephalic.  Extraocular movements are intact. Oropharynx is clear. Neck: Neck is supple, no palpable cervical or supraclavicular lymphadenopathy. Heart: Regular in rate and rhythm with no murmurs,  rubs, or gallops. Chest: Clear to auscultation bilaterally, with no rhonchi, wheezes, or rales. Abdomen: Soft, nondistended, with no rigidity or guarding. Ileostomy bag intact. +Pain in left side/back at site of spleen. Extremities: No cyanosis or edema. Lymphatics: No concerning lymphadenopathy. Skin: No concerning lesions. Musculoskeletal: symmetric strength and muscle tone throughout. Neurologic: Cranial nerves II through XII are grossly intact. No obvious focalities. Speech is fluent. Coordination is intact. Psychiatric: Judgment and insight are intact. Affect is appropriate.   LABORATORY DATA:  Lab Results  Component Value Date   WBC 7.7 08/22/2012   HGB 12.6* 08/22/2012   HCT 37.2* 08/22/2012   MCV 99.2* 08/22/2012   PLT 375 08/22/2012   CMP     Component Value Date/Time   NA 140 08/22/2012 1152   NA 136 12/02/2011 0600   K 3.8 08/22/2012 1152   K 4.1 12/02/2011 0600   CL 104 07/26/2012 0744   CL 100 12/02/2011 0600  CO2 29 08/22/2012 1152   CO2 29 12/02/2011 0600   GLUCOSE 113 08/22/2012 1152   GLUCOSE 100* 07/26/2012 0744   GLUCOSE 98 12/02/2011 0600   BUN 15.3 08/22/2012 1152   BUN 9 12/02/2011 0600   CREATININE 1.1 08/22/2012 1152   CREATININE 0.64 12/02/2011 0600   CALCIUM 9.4 08/22/2012 1152   CALCIUM 8.3* 12/02/2011 0600   PROT 8.2 08/22/2012 1152   PROT 7.0 11/29/2011 0625   ALBUMIN 3.2* 08/22/2012 1152   ALBUMIN 1.7* 11/29/2011 0625   AST 16 08/22/2012 1152   AST 13 11/29/2011 0625   ALT 12 08/22/2012 1152   ALT 13 11/29/2011 0625   ALKPHOS 62 08/22/2012 1152   ALKPHOS 58 11/29/2011 0625   BILITOT 0.60 08/22/2012 1152   BILITOT 0.3 11/29/2011 0625   GFRNONAA >90 12/02/2011 0600   GFRAA >90 12/02/2011 0600         RADIOGRAPHY: Ct Chest W Contrast  08/03/2012   *RADIOLOGY REPORT*  Clinical Data:  Restaging metastatic rectal cancer.  CT CHEST, ABDOMEN AND PELVIS WITH CONTRAST  Technique:  Multidetector CT imaging of the chest, abdomen and pelvis was performed following the standard  protocol during bolus administration of intravenous contrast.  Contrast: OMNIPAQUE IOHEXOL 300 MG/ML  SOLN  Comparison:  05/19/2012  CT CHEST  Findings:  The chest wall is unremarkable and stable.  The right- sided Port-A-Cath is unchanged.  No supraclavicular or axillary mass or adenopathy.  The bony thorax is intact.  No destructive bone lesions or spinal canal compromise.  The heart is normal in size.  No pericardial effusion.  No mediastinal or hilar lymphadenopathy.  The aorta is normal in caliber.  The esophagus is grossly normal.  Examination of the lung parenchyma demonstrates diffuse pulmonary metastatic disease.  The large right upper lobe lesion on image number 27 has enlarged slightly.  It previously measured 22 x 18 mm and now measures 25 x 20 mm.  The right lower lobe lesion on image number 35 measures 18 x 16 mm and previously measured 17 x 16 mm. There are two adjacent nodules in the left lower lobe on image number 46.  The more lateral lesion measures 12 x 12 mm and previously measured 8 x 8 mm.  No definite new lesions.  There is a new or progressive area of probable tumor in the right lower lobe bronchus.  Mucoid impaction is also possible.  IMPRESSION:  1.  Slight progression of metastatic disease involving the lungs. 2.  New or progressive endobronchial tumor or mucoid impaction in the right lower lobe bronchus.  CT ABDOMEN AND PELVIS  Findings:  The liver demonstrates mild diffuse fatty infiltration but no definite metastatic liver lesions.  No biliary dilatation. The splenic lesion has enlarged.  It previously measured 2.8 x 1.5 cm and now measures 4.2 x 2.3 cm.  The pancreas is unremarkable. The adrenal glands and kidneys are unremarkable and stable.  Fairly significant dilatation of the colon with stool and air likely due to a diffuse colonic ileus.  The small bowel is grossly normal.  The stomach is distended with contrast fluid and food.  In the right lower quadrant suspicious there  is a soft tissue lesion in the mesentery measuring approximately 21 mm.  There is also persistent marked rectal wall thickening and a progressive mass- like soft tissue density in the right lower rectal area on image number 109 which measures approximately 3.0 x 2.7 cm.  The bladder is unremarkable.  No  inguinal mass or hernia.  The bony structures are intact.  No destructive bone lesions.  SI joint degenerative changes are stable.  IMPRESSION:  1.  Enlarging left splenic lesion. 2.  Persistent diffuse rectal wall thickening and slightly enlarging right perirectal soft tissue mass. 3.  No mesenteric or retroperitoneal adenopathy.  I suspect there is a 2 cm lesion in the right lower quadrant mesentery.   Original Report Authenticated By: Rudie Meyer, M.D.   Ct Angio Chest Pe W/cm &/or Wo Cm  08/22/2012   *RADIOLOGY REPORT*  Clinical Data: Metastatic rectal cancer.  Pleuritic chest pain.  CT ANGIOGRAPHY CHEST  Technique:  Multidetector CT imaging of the chest using the standard protocol during bolus administration of intravenous contrast. Multiplanar reconstructed images including MIPs were obtained and reviewed to evaluate the vascular anatomy.  Contrast: OMNIPAQUE IOHEXOL 350 MG/ML SOLN  Comparison: None.  Findings: No pleural effusion.  There is new consolidation of the posterior right lower lobe.  Findings are likely due to progression of peribronchial tumor with endoluminal extension and occlusion of the posterior right lower lobe bronchus.  Multiple bilateral pulmonary nodules are identified consistent with metastatic disease.  The right upper lobe index nodule measures 2.7 cm, image number 48/series 7.  Previously 2.5 cm.  There is an index right upper lobe nodule measuring 1.1 cm, image 41/series 7.  This is compared with 1.0 cm previously.  Left lower lobe tumor measures 1.2 cm, image 77/series 7.  This is similar to previous exam.  Heart size is normal.  No pericardial effusion.  No mediastinal  adenopathy.  The esophagus appears normal.  The main pulmonary artery appears patent.  No abnormal filling defects are identified to suggest an acute pulmonary embolus.  No axillary or supraclavicular adenopathy.  Imaging through the upper abdomen shows a splenic lesion which measures 3.4 x 4.5 cm, image 91/series 4.  This is compared with 2.3 x 4.2 cm previously. There is no aggressive lytic or sclerotic bone lesions identified.  IMPRESSION:  1.  No evidence for acute pulmonary embolus. 2.  Interval progression of the central right lower lobe tumor involving the right lower lobe bronchus. There is resultant progressive consolidation of the posterior right lower lobe. 3.  Other pulmonary nodules are not significantly changed. 4.  Splenic metastasis demonstrates mild increase in the interval.   Original Report Authenticated By: Signa Kell, M.D.   Ct Abdomen Pelvis W Contrast  08/03/2012   *RADIOLOGY REPORT*  Clinical Data:  Restaging metastatic rectal cancer.  CT CHEST, ABDOMEN AND PELVIS WITH CONTRAST  Technique:  Multidetector CT imaging of the chest, abdomen and pelvis was performed following the standard protocol during bolus administration of intravenous contrast.  Contrast: OMNIPAQUE IOHEXOL 300 MG/ML  SOLN  Comparison:  05/19/2012  CT CHEST  Findings:  The chest wall is unremarkable and stable.  The right- sided Port-A-Cath is unchanged.  No supraclavicular or axillary mass or adenopathy.  The bony thorax is intact.  No destructive bone lesions or spinal canal compromise.  The heart is normal in size.  No pericardial effusion.  No mediastinal or hilar lymphadenopathy.  The aorta is normal in caliber.  The esophagus is grossly normal.  Examination of the lung parenchyma demonstrates diffuse pulmonary metastatic disease.  The large right upper lobe lesion on image number 27 has enlarged slightly.  It previously measured 22 x 18 mm and now measures 25 x 20 mm.  The right lower lobe lesion on image  number 35  measures 18 x 16 mm and previously measured 17 x 16 mm. There are two adjacent nodules in the left lower lobe on image number 46.  The more lateral lesion measures 12 x 12 mm and previously measured 8 x 8 mm.  No definite new lesions.  There is a new or progressive area of probable tumor in the right lower lobe bronchus.  Mucoid impaction is also possible.  IMPRESSION:  1.  Slight progression of metastatic disease involving the lungs. 2.  New or progressive endobronchial tumor or mucoid impaction in the right lower lobe bronchus.  CT ABDOMEN AND PELVIS  Findings:  The liver demonstrates mild diffuse fatty infiltration but no definite metastatic liver lesions.  No biliary dilatation. The splenic lesion has enlarged.  It previously measured 2.8 x 1.5 cm and now measures 4.2 x 2.3 cm.  The pancreas is unremarkable. The adrenal glands and kidneys are unremarkable and stable.  Fairly significant dilatation of the colon with stool and air likely due to a diffuse colonic ileus.  The small bowel is grossly normal.  The stomach is distended with contrast fluid and food.  In the right lower quadrant suspicious there is a soft tissue lesion in the mesentery measuring approximately 21 mm.  There is also persistent marked rectal wall thickening and a progressive mass- like soft tissue density in the right lower rectal area on image number 109 which measures approximately 3.0 x 2.7 cm.  The bladder is unremarkable.  No inguinal mass or hernia.  The bony structures are intact.  No destructive bone lesions.  SI joint degenerative changes are stable.  IMPRESSION:  1.  Enlarging left splenic lesion. 2.  Persistent diffuse rectal wall thickening and slightly enlarging right perirectal soft tissue mass. 3.  No mesenteric or retroperitoneal adenopathy.  I suspect there is a 2 cm lesion in the right lower quadrant mesentery.   Original Report Authenticated By: Rudie Meyer, M.D.      IMPRESSION/PLAN: This is a lovely  52 year old gentleman with metastatic rectal cancer. He is most symptomatic at the site of his splenic tumor which is growing on imaging. This is causing pain. He also has some shortness of breath and a new cough which may be related to right lower lobe consolidation from a right lower lower /bronchial mass. I recommended radiotherapy to both these sites, for local control with palliative intent, to 30 Gray in 10 fractions. We can simulate him today. He is enthusiastic about this plan. He understands the most common side effects would be fatigue, skin irritation, GI upset, and possibly heartburn from esophageal irritation. Consent form has been signed and placed in his chart.  I spent 30 minutes face to face with the patient and more than 50% of that time was spent in counseling and/or coordination of care.    __________________________________________   Lonie Peak, MD

## 2012-08-24 ENCOUNTER — Telehealth: Payer: Self-pay | Admitting: Oncology

## 2012-08-24 NOTE — Telephone Encounter (Signed)
Pt sched with Dr. Basilio Cairo 7.16 @ 3:05pm pt aware

## 2012-08-25 ENCOUNTER — Other Ambulatory Visit: Payer: Self-pay

## 2012-08-28 ENCOUNTER — Telehealth: Payer: Self-pay | Admitting: Oncology

## 2012-08-28 ENCOUNTER — Encounter: Payer: Self-pay | Admitting: Radiation Oncology

## 2012-08-28 ENCOUNTER — Telehealth: Payer: Self-pay | Admitting: *Deleted

## 2012-08-28 NOTE — Progress Notes (Signed)
3-D simulation note: the patient underwent 3-D simulation for treatment to his right lung. Dose volume histograms were obtained for the target volume and normal shredding structures including the heart, lungs and spinal cord. We met our departmental guidelines. He'll receive 3000 cGy in 10 sessions utilizing 6 MV/15 MV photons. He was set up to a 3 field technique and 3 unique multileaf collimators are designed to conform the field. The doses prescribed at 100% isodose curve. He also underwent simulation to his spleen, and again dose fine histograms were obtained for the normal surrounding structures. He was also set up to a 3 field technique and 3 separate multileaf collimators were designed to conform the field. We met our departmental guidelines. He will receive 3000 cGy 10 sessions utilizing 15 MV and 6 MV photons. He underwent forward the CT scanning for lung motion. Please see separate note for motion management. He'll undergo daily cone beam CT setting up to his target volumes and spine for his lung fields and spleen for his abdominal field.

## 2012-08-28 NOTE — Progress Notes (Signed)
  Radiation Oncology         [phone]) [phone] ________________________________  Name: Jeffery [name] MRN: [id number]  Date: 08/28/2012  DOB: [birth year]  RESPIRATORY MOTION MANAGEMENT SIMULATION  NARRATIVE:  In order to account for effect of respiratory motion on target structures and other organs in the planning and delivery of radiotherapy, this patient underwent respiratory motion management simulation.  To accomplish this, when the patient was brought to the CT simulation planning suite, 4D respiratoy motion management CT images were obtained.  The CT images were loaded into the planning software.  Then, using a variety of tools including Cine, MIP, and standard views, the target volume and planning target volumes (PTV) were delineated.  Avoidance structures were contoured.  Treatment planning then occurred.  Dose volume histograms were generated and reviewed for each of the requested structure.  The resulting plan was carefully reviewed and approved today.

## 2012-08-28 NOTE — Telephone Encounter (Signed)
Called pt to r/s his flush appt but he was not avail.Marland Kitcheni will try back...td

## 2012-08-28 NOTE — Telephone Encounter (Signed)
Per 7/11 pof r/s flush. Flush appt on 7/11 was tied to a chemo appt on 7/9 that was scheduled. Flush appt was for a d/c - no r/s needed as pt tx 7/9 was cx'd.

## 2012-08-30 ENCOUNTER — Ambulatory Visit
Admission: RE | Admit: 2012-08-30 | Discharge: 2012-08-30 | Disposition: A | Discharge status: Home / Self Care | Payer: Medicare Other | Source: Ambulatory Visit | Attending: Radiation Oncology | Admitting: Radiation Oncology

## 2012-08-31 ENCOUNTER — Ambulatory Visit
Admission: RE | Admit: 2012-08-31 | Discharge: 2012-08-31 | Disposition: A | Discharge status: Home / Self Care | Payer: Medicare Other | Source: Ambulatory Visit | Attending: Radiation Oncology | Admitting: Radiation Oncology

## 2012-09-01 ENCOUNTER — Ambulatory Visit
Admission: RE | Admit: 2012-09-01 | Discharge: 2012-09-01 | Disposition: A | Discharge status: Home / Self Care | Payer: Medicare Other | Source: Ambulatory Visit | Attending: Radiation Oncology | Admitting: Radiation Oncology

## 2012-09-04 ENCOUNTER — Ambulatory Visit
Admission: RE | Admit: 2012-09-04 | Discharge: 2012-09-04 | Disposition: A | Discharge status: Home / Self Care | Payer: Medicare Other | Source: Ambulatory Visit | Attending: Radiation Oncology | Admitting: Radiation Oncology

## 2012-09-04 ENCOUNTER — Encounter: Payer: Self-pay | Admitting: Radiation Oncology

## 2012-09-04 VITALS — BP 144/94 | HR 97 | Temp 98.2°F | Resp 20 | Wt 128.8 lb

## 2012-09-04 DIAGNOSIS — C261 Malignant neoplasm of spleen: Secondary | ICD-10-CM

## 2012-09-04 NOTE — Progress Notes (Signed)
   Weekly Management Note:  outpatient Current Dose:9 Gy  Projected Dose: 30 Gy   Narrative:  The patient presents for routine under treatment assessment.  CBCT/MVCT images/Port film x-rays were reviewed.  The chart was checked. Denies GI upset. Pain in abdomen/side better. SOB/cough better.  Physical Findings:  weight is 128 lb 12.8 oz (58.423 kg). His oral temperature is 98.2 F (36.8 C). His blood pressure is 144/94 and his pulse is 97. His respiration is 20.   NAD  Impression:  The patient is tolerating radiotherapy.  Plan:  Continue radiotherapy as planned.   ________________________________   Lonie Peak, M.D.

## 2012-09-04 NOTE — Progress Notes (Signed)
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

## 2012-09-05 ENCOUNTER — Telehealth: Payer: Self-pay | Admitting: Oncology

## 2012-09-05 ENCOUNTER — Ambulatory Visit (HOSPITAL_BASED_OUTPATIENT_CLINIC_OR_DEPARTMENT_OTHER): Payer: Medicare Other | Admitting: Nurse Practitioner

## 2012-09-05 ENCOUNTER — Ambulatory Visit: Admission: RE | Admit: 2012-09-05 | Payer: Medicare Other | Source: Ambulatory Visit

## 2012-09-05 VITALS — BP 130/87 | HR 98 | Temp 98.1°F | Resp 18 | Ht 64.0 in | Wt 126.5 lb

## 2012-09-05 DIAGNOSIS — C20 Malignant neoplasm of rectum: Secondary | ICD-10-CM

## 2012-09-05 DIAGNOSIS — C7800 Secondary malignant neoplasm of unspecified lung: Secondary | ICD-10-CM

## 2012-09-05 MED ORDER — OXYCODONE-ACETAMINOPHEN 10-325 MG PO TABS: 1.0000 | ORAL_TABLET | ORAL | Status: DC | PRN

## 2012-09-05 NOTE — Progress Notes (Addendum)
OFFICE PROGRESS NOTE  Interval history:  Jeffery [name] returns as scheduled. He began a course of palliative radiation to the splenic lesion and right lower lobe/bronchial mass on 08/31/2012. His pain is "a little better". He estimates taking 4 Percocet tablets per day. Cough is significantly better. He denies shortness of breath. Overall he states that he is "moving better".   Objective: Blood pressure 130/87, pulse 98, temperature 98.1 F (36.7 C), temperature source Oral, resp. rate 18, height 5\' 4"  (1.626 m), weight 126 lb 8 oz (57.38 kg).  No thrush. Lungs clear. Regular cardiac rhythm. Port-A-Cath site without erythema. Abdomen soft. No hepatomegaly. Tender around the ileostomy takedown site. Extremities without edema.  Lab Results: Lab Results  Component Value Date   WBC 7.7 08/22/2012   HGB 12.6* 08/22/2012   HCT 37.2* 08/22/2012   MCV 99.2* 08/22/2012   PLT 375 08/22/2012    Chemistry:    Chemistry      Component Value Date/Time   NA 140 08/22/2012 1152   NA 136 12/02/2011 0600   K 3.8 08/22/2012 1152   K 4.1 12/02/2011 0600   CL 104 07/26/2012 0744   CL 100 12/02/2011 0600   CO2 29 08/22/2012 1152   CO2 29 12/02/2011 0600   BUN 15.3 08/22/2012 1152   BUN 9 12/02/2011 0600   CREATININE 1.1 08/22/2012 1152   CREATININE 0.64 12/02/2011 0600      Component Value Date/Time   CALCIUM 9.4 08/22/2012 1152   CALCIUM 8.3* 12/02/2011 0600   ALKPHOS 62 08/22/2012 1152   ALKPHOS 58 11/29/2011 0625   AST 16 08/22/2012 1152   AST 13 11/29/2011 0625   ALT 12 08/22/2012 1152   ALT 13 11/29/2011 0625   BILITOT 0.60 08/22/2012 1152   BILITOT 0.3 11/29/2011 0625       Studies/Results: Ct Angio Chest Pe W/cm &/or Wo Cm  08/22/2012   *RADIOLOGY REPORT*  Clinical Data: Metastatic rectal cancer.  Pleuritic chest pain.  CT ANGIOGRAPHY CHEST  Technique:  Multidetector CT imaging of the chest using the standard protocol during bolus administration of intravenous contrast. Multiplanar reconstructed images  including MIPs were obtained and reviewed to evaluate the vascular anatomy.  Contrast: OMNIPAQUE IOHEXOL 350 MG/ML SOLN  Comparison: None.  Findings: No pleural effusion.  There is new consolidation of the posterior right lower lobe.  Findings are likely due to progression of peribronchial tumor with endoluminal extension and occlusion of the posterior right lower lobe bronchus.  Multiple bilateral pulmonary nodules are identified consistent with metastatic disease.  The right upper lobe index nodule measures 2.7 cm, image number 48/series 7.  Previously 2.5 cm.  There is an index right upper lobe nodule measuring 1.1 cm, image 41/series 7.  This is compared with 1.0 cm previously.  Left lower lobe tumor measures 1.2 cm, image 77/series 7.  This is similar to previous exam.  Heart size is normal.  No pericardial effusion.  No mediastinal adenopathy.  The esophagus appears normal.  The main pulmonary artery appears patent.  No abnormal filling defects are identified to suggest an acute pulmonary embolus.  No axillary or supraclavicular adenopathy.  Imaging through the upper abdomen shows a splenic lesion which measures 3.4 x 4.5 cm, image 91/series 4.  This is compared with 2.3 x 4.2 cm previously. There is no aggressive lytic or sclerotic bone lesions identified.  IMPRESSION:  1.  No evidence for acute pulmonary embolus. 2.  Interval progression of the central right lower lobe tumor  involving the right lower lobe bronchus. There is resultant progressive consolidation of the posterior right lower lobe. 3.  Other pulmonary nodules are not significantly changed. 4.  Splenic metastasis demonstrates mild increase in the interval.   Original Report Authenticated By: Signa Kell, M.D.    Medications: I have reviewed the patient's current medications.  Assessment/Plan:  1. Clinical stage III (uT3 uN1) rectal cancer status post pelvic radiation, August 24 through November 18, 2009. He underwent a  laparoscopic-assisted low anterior resection with diverting loop ileostomy, January 14, 2010, with the final pathology showing invasive adenocarcinoma focally extending into the perirectal connective tissue, negative margins, extensive ulceration with perforation and perirectal abscess. There was no lymph node involvement (pT3 pN0). 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 He began adjuvant CAPOX chemotherapy March 11, 2010. He completed the sixth and final cycle of CAPOX 06/25/2010. CT of the abdomen and pelvis 03/11/2011 confirmed multiple nodules at the lung bases consistent with metastatic disease. 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 He completed cycle 1 of FOLFIRI/Avastin 04/29/2011. He completed cycle 3 05/27/2011. CEA was improved on 06/10/2011. He completed cycle 4 on 06/10/2011. He completed cycle 5 on 06/24/2011. He completed cycle 6 on 07/08/2011. Restaging CT evaluation 07/22/2011 showed a slight decrease in pulmonary nodules. He completed cycle 8 FOLFIRI/Avastin on 08/12/2011. He completed cycle 9 on 09/02/2011. Chest CT on 10/07/2011 showed increased size and number of lung nodules. CT scans 12/13/2011 with mild progression of pulmonary metastatic disease; interval resolution of left upper quadrant and central pelvic fluid collections; residual gas and fluid collection in the undersurface of the left lower quadrant ventral abdominal wall; similar appearance of soft tissue thickening and enhancement to the perirectal region; abnormal small bowel dilatation and moderate colonic stool burden consistent with ileus and/or constipation. He completed cycle 1 FOLFOXIRI on 01/12/2012; cycle 4 on 02/24/2012.The CEA was slightly lower on 02/24/2012. Restaging CT on 03/09/2012 without evidence of disease progression. He completed cycle 5 on 03/28/2012. The CEA was more elevated on 04/10/2012 (22.9 ). He completed cycle 6 FOLFOXIRI on 04/12/2012  ,cycle 7 on 04/26/2012, and cycle 8 on 05/10/2012. CEA was improved on 05/10/2012. Restaging CT 05/19/2012 revealed stable lung nodules and stable postoperative changes in the abdomen/pelvis, stable CEA 06/14/2012. CEA stable on 06/28/2012. He completed cycle 12 FOLFOXIRI on 07/26/2012. -Restaging CT 08/03/2012 with enlargement of some of the lung nodules, increased perirectal mass, increased size of a splenic lesion. Irinotecan and oxaliplatin were discontinued. Systemic therapy continued with 5-FU/leucovorin.  -CT 08/22/2012 showed interval progression of central right lower lobe tumor involving the right lower lobe bronchus and mild increase in a splenic lesion. He began a course of palliative radiation to both areas on 08/31/2012. 2. Strangulated peristomal hernia and small bowel obstruction status post reduction of peristomal hernia, omentectomy, exploratory laparotomy, small bowel resection x2 on January 28, 2010.  3. History of rectal bleeding secondary to number 1.  4. History of an elevated CEA. Improved on 06/10/2011 and 07/29/2011. 5. History of a left adrenal nodule.  6. Weight loss. Improved. 7. Right knee pain/effusion status post evaluation at [facility].  8. CT abdomen/pelvis 03/11/2011 with multiple nodules at the lung bases consistent with metastatic involvement of the lungs; prominent presacral soft tissue; air bubble within the presacral soft tissue appeared to be extraluminal and breakdown at the anastomotic site could not be excluded. No discrete abscess was seen. 9. Elevated blood pressure. Question effect of Avastin. He was started  on Norvasc on 06/10/2011.  10. Frequent bowel movements. Likely related to multiple bowel resections. Imodium was not effective. 11. Suture at the ileostomy reversal scar. Suture removed during the recent hospitalization. 12. Erectile dysfunction. The testosterone level returned at 508 on 08/12/2011, he was referred to urology, but  Medicaid would not pay for this visit. 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 Culture returned positive for abundant Escherichia coli. He completed a course of Augmentin.  14. Status post diverting loop descending colostomy 10/05/2011. 15. Status post multiple rectal biopsies 10/05/2011, all negative for malignancy. 16. Hospitalization 10/13/2011 with a bowel obstruction. Resolved. 17. Hospitalization 11/07/2011 with a recurrent small bowel obstruction. He was taken to the OR on 11/10/2011 and underwent an exploratory laparotomy with small bowel resection x2 and incidental appendectomy. He was found to have a closed loop obstruction. Pathology on #1 small bowel resection showed focal perforation with no evidence of malignancy; pathology on #2 small bowel resection showed metastatic adenocarcinoma consistent with colorectal primary; pathology on the appendix showed serosal fibrous adhesions with no evidence of malignancy.He developed postoperative respiratory failure, sepsis and renal insufficiency. He improved gradually and was able to be extubated. He required placement of multiple percutaneous drains for intraperitoneal abscesses. He was discharged home on 12/02/2011. No remaining abscess on the CT 03/09/2012. The pelvic drain has been removed. 18. Deconditioning. Improved. 19. Low back and testicular pain when supine, tenderness of the right testicle. Improved. He did not complain of this pain at today's visit. 20. Diarrhea following cycle 1 FOLFOXIRI. 21. Pain at previous pelvic drain site, ileostomy scar and the lower aspect of the midline scar. He takes Percocet as needed. ? neuropathic pain. 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 23. Acute onset pleuritic chest pain 08/22/2012. The pain is likely related to a pleural-based versus a splenic mass. He is completing a course of palliative radiation. The pain is better. He  continues Percocet as needed.  Disposition-Mr. Coor has metastatic rectal cancer. He has most recently been treated with FOLFOXIRI with recent evidence of progression.   Dr. Truett Perna recommends a referral to Dr. Maryruth Hancock at [facility] to see if Mr. Clayburn is a candidate for the Xeloda/Aflibercept trial. He is not sure he will have transportation to travel to [place].  The pain he was experiencing at the time of his last visit is better. He is scheduled to complete the course of radiation on 09/13/2012. We gave him a new prescription for Percocet at today's visit.  He will return for a followup visit on 09/28/2012. He will call us later this week if he is able to arrange transportation to [facility] and we will make the referral to Dr. Maryruth Hancock.  Due to his young age of diagnosis we made a referral to the genetics counselor.  Patient seen with Dr. Truett Perna.  Lonna Cobb ANP/GNP-BC   Mr. Luber appears more comfortable after treatment with radiation. He will complete the course of palliative radiation over the next week. Systemic treatment options are limited. He has been treated with the available "standard" systemic chemotherapy agents. The tumor has a K-ras mutation and he will not be a candidate for EGFR inhibitor therapy.  Mr. Tetrault will check into transportation arrangements so that we can refer him to Dr. Maryruth Hancock to consider the Xeloda/Aflibercept trial.The history of a bowel perforation in 2013 may exclude him from the study.  Mr. Hearns maintains a good performance status and may be a candidate for other clinical  trials. He will return for an office visit in the next 3-4 weeks   Mancel Bale, M.D.

## 2012-09-05 NOTE — Telephone Encounter (Signed)
gv pt appt schedule for July and August including genetics appt.

## 2012-09-06 ENCOUNTER — Other Ambulatory Visit: Payer: Medicare Other

## 2012-09-06 ENCOUNTER — Ambulatory Visit: Payer: Medicare Other

## 2012-09-06 ENCOUNTER — Ambulatory Visit: Payer: Medicare Other | Admitting: Nurse Practitioner

## 2012-09-06 ENCOUNTER — Ambulatory Visit
Admission: RE | Admit: 2012-09-06 | Discharge: 2012-09-06 | Disposition: A | Discharge status: Home / Self Care | Payer: Medicare Other | Source: Ambulatory Visit | Attending: Radiation Oncology | Admitting: Radiation Oncology

## 2012-09-07 ENCOUNTER — Encounter: Payer: Medicare Other | Admitting: Genetic Counselor

## 2012-09-07 ENCOUNTER — Other Ambulatory Visit: Payer: Self-pay | Admitting: Oncology

## 2012-09-07 ENCOUNTER — Ambulatory Visit
Admission: RE | Admit: 2012-09-07 | Discharge: 2012-09-07 | Disposition: A | Discharge status: Home / Self Care | Payer: Medicare Other | Source: Ambulatory Visit | Attending: Radiation Oncology | Admitting: Radiation Oncology

## 2012-09-07 ENCOUNTER — Other Ambulatory Visit: Payer: Medicare Other | Admitting: Lab

## 2012-09-08 ENCOUNTER — Ambulatory Visit
Admission: RE | Admit: 2012-09-08 | Discharge: 2012-09-08 | Disposition: A | Discharge status: Home / Self Care | Payer: Medicare Other | Source: Ambulatory Visit | Attending: Radiation Oncology | Admitting: Radiation Oncology

## 2012-09-11 ENCOUNTER — Ambulatory Visit: Admission: RE | Admit: 2012-09-11 | Payer: Medicare Other | Source: Ambulatory Visit

## 2012-09-11 ENCOUNTER — Encounter: Payer: Self-pay | Admitting: Radiation Oncology

## 2012-09-11 ENCOUNTER — Other Ambulatory Visit: Payer: Self-pay | Admitting: Radiation Oncology

## 2012-09-11 ENCOUNTER — Ambulatory Visit
Admission: RE | Admit: 2012-09-11 | Discharge: 2012-09-11 | Disposition: A | Discharge status: Home / Self Care | Payer: Medicare Other | Source: Ambulatory Visit | Attending: Radiation Oncology | Admitting: Radiation Oncology

## 2012-09-11 VITALS — BP 134/88 | HR 100 | Temp 98.0°F | Resp 20 | Wt 126.5 lb

## 2012-09-11 DIAGNOSIS — K651 Peritoneal abscess: Secondary | ICD-10-CM

## 2012-09-11 DIAGNOSIS — C261 Malignant neoplasm of spleen: Secondary | ICD-10-CM

## 2012-09-11 DIAGNOSIS — C20 Malignant neoplasm of rectum: Secondary | ICD-10-CM

## 2012-09-11 MED ORDER — OXYCODONE HCL ER 60 MG PO T12A: 1.0000 | EXTENDED_RELEASE_TABLET | Freq: Two times a day (BID) | ORAL | Status: DC

## 2012-09-11 MED ORDER — MORPHINE SULFATE ER 60 MG PO TBCR: 60.0000 mg | EXTENDED_RELEASE_TABLET | Freq: Three times a day (TID) | ORAL | Status: DC

## 2012-09-11 NOTE — Progress Notes (Signed)
   Weekly Management Note:  Outpatient Current Dose:  18 Gy  Projected Dose:30 Gy   Narrative:  The patient presents for routine under treatment assessment.  CBCT/MVCT images/Port film x-rays were reviewed.  The chart was checked. Back pain worse than usual. Doesn't think he can tolerate RT today.  Taking Oxycocodone/Acet 10-325 ; 2 tabs q4 hrs; pain still severe.  Bowels regular. Has Miralax if needed.  Physical Findings:  weight is 126 lb 8 oz (57.38 kg). His oral temperature is 98 F (36.7 C). His blood pressure is 134/88 and his pulse is 100. His respiration is 20.  No skin irritation over torso thus far.  Impression:  The patient is tolerating radiotherapy.  Plan:  Continue radiotherapy as planned. Will start Oxycontin 60 mg BID, warned this can slow bowels and Miralax may be needed. Continue short acting meds prn. Will hold RT today due to pain.  ________________________________   Lonie Peak, M.D.

## 2012-09-11 NOTE — Progress Notes (Signed)
Weekly rad tx, 6 completed so far pt c/o low back pain an 8- on a 1-10 scale, took percocet, aleve today noon, no help with the pain, no bowels regular, no c/o nausea, says percocet only for the scar tissue in my back, I cannot lay on the table like this, loss of 2  lbs 3:43 PM

## 2012-09-12 ENCOUNTER — Ambulatory Visit
Admission: RE | Admit: 2012-09-12 | Discharge: 2012-09-12 | Disposition: A | Discharge status: Home / Self Care | Payer: Medicare Other | Source: Ambulatory Visit | Attending: Radiation Oncology | Admitting: Radiation Oncology

## 2012-09-12 ENCOUNTER — Encounter: Payer: Self-pay | Admitting: Nutrition

## 2012-09-12 NOTE — Progress Notes (Signed)
I had a brief followup with patient today in the lobby.  Patient is requesting samples of Ensure Plus.  He verbalizes concern regarding weight loss.  He also would like information on a fact sheet to take with him on ways to get more calories and help him gain weight.  I provided patient with samples of strawberry Ensure Plus, which is his favorite.  I educated him on strategies for increasing calories and protein in small amounts throughout the day.  I've given him recommendations for snacks.  I provided him with fact sheets and coupons for Ensure Plus.  Patient will continue to contact me for questions or concerns.

## 2012-09-13 ENCOUNTER — Ambulatory Visit
Admission: RE | Admit: 2012-09-13 | Discharge: 2012-09-13 | Disposition: A | Discharge status: Home / Self Care | Payer: Medicare Other | Source: Ambulatory Visit | Attending: Radiation Oncology | Admitting: Radiation Oncology

## 2012-09-13 ENCOUNTER — Ambulatory Visit: Payer: Medicare Other

## 2012-09-14 ENCOUNTER — Ambulatory Visit
Admission: RE | Admit: 2012-09-14 | Discharge: 2012-09-14 | Disposition: A | Discharge status: Home / Self Care | Payer: Medicare Other | Source: Ambulatory Visit | Attending: Radiation Oncology | Admitting: Radiation Oncology

## 2012-09-14 ENCOUNTER — Ambulatory Visit: Payer: Medicare Other

## 2012-09-15 ENCOUNTER — Ambulatory Visit: Admission: RE | Admit: 2012-09-15 | Payer: Medicare Other | Source: Ambulatory Visit

## 2012-09-15 NOTE — Addendum Note (Signed)
Encounter addended by: Delynn Flavin, RN on: 09/15/2012  4:53 PM<BR>     Documentation filed: Charges VN

## 2012-09-18 ENCOUNTER — Ambulatory Visit: Payer: Medicare Other

## 2012-09-19 ENCOUNTER — Ambulatory Visit
Admission: RE | Admit: 2012-09-19 | Discharge: 2012-09-19 | Disposition: A | Discharge status: Home / Self Care | Payer: Medicare Other | Source: Ambulatory Visit | Attending: Radiation Oncology | Admitting: Radiation Oncology

## 2012-09-20 ENCOUNTER — Encounter: Payer: Self-pay | Admitting: Radiation Oncology

## 2012-09-20 ENCOUNTER — Ambulatory Visit
Admission: RE | Admit: 2012-09-20 | Discharge: 2012-09-20 | Disposition: A | Discharge status: Home / Self Care | Payer: Medicare Other | Source: Ambulatory Visit | Attending: Radiation Oncology | Admitting: Radiation Oncology

## 2012-09-20 VITALS — BP 146/97 | HR 98 | Temp 98.3°F | Ht 64.0 in | Wt 121.6 lb

## 2012-09-20 DIAGNOSIS — C20 Malignant neoplasm of rectum: Secondary | ICD-10-CM

## 2012-09-20 DIAGNOSIS — C261 Malignant neoplasm of spleen: Secondary | ICD-10-CM

## 2012-09-20 MED ORDER — MORPHINE SULFATE ER 60 MG PO TBCR: 60.0000 mg | EXTENDED_RELEASE_TABLET | Freq: Three times a day (TID) | ORAL | Status: DC

## 2012-09-20 MED ORDER — OXYCODONE-ACETAMINOPHEN 10-325 MG PO TABS: 1.0000 | ORAL_TABLET | Freq: Four times a day (QID) | ORAL | Status: DC | PRN

## 2012-09-20 NOTE — Progress Notes (Signed)
   Weekly Management Note  Outpatient  Completed Radiotherapy. Total Dose: 30 Gy   Narrative:  The patient presents for routine under treatment assessment on last day of radiotherapy.  CBCT/MVCT images/Port film x-rays were reviewed.  The chart was checked. Pain is much better - rx'd MS Contin TID as Oxycontin wasn't covered by insurance.  Taking Percocet intermittently for breakthrough pain.  No more coughing, breathing well. Feels well.  Physical Findings:  height is 5\' 4"  (1.626 m) and weight is 121 lb 9.6 oz (55.157 kg). His temperature is 98.3 F (36.8 C). His blood pressure is 146/97 and his pulse is 98.   NAD, breathing comfortably.  Impression:  The patient has tolerated radiotherapy.  Plan:  Routine follow-up in one month. Refills for MS Contin, Percocet given today with advice on how to taper down if pain continues to improve. [facility] consult for systemic options. ________________________________   Lonie Peak, M.D.

## 2012-09-20 NOTE — Progress Notes (Signed)
Jeffery [name] completes radiation today to his right lower lobe and splenic tumor.  He denies any pain since and is ambulating without difficulty.  D?enies any nausea nor vomiting.  He will be scheduled to see physicians at [facility] per Cr. Mancel Bale.

## 2012-09-21 ENCOUNTER — Telehealth: Payer: Self-pay | Admitting: *Deleted

## 2012-09-21 DIAGNOSIS — C261 Malignant neoplasm of spleen: Secondary | ICD-10-CM

## 2012-09-21 DIAGNOSIS — C20 Malignant neoplasm of rectum: Secondary | ICD-10-CM

## 2012-09-21 MED ORDER — OXYCODONE-ACETAMINOPHEN 10-325 MG PO TABS: 1.0000 | ORAL_TABLET | Freq: Four times a day (QID) | ORAL | Status: DC | PRN

## 2012-09-21 NOTE — Telephone Encounter (Addendum)
Jeffery [name] has script for MS Contin to be filled the end of August.  Initial MS Contin script ordered by Dr. Karoline Caldwell was written on 09-11-2012.  With new MS Contin script he may change to q12 hours if pain improved.  Returned MS Contin script to patient and wrote "void" on the Percocet script for qty of fifty percocet due to new script for qty of 100 signed today.  Next F/u is 09-28-2012 at 2:45 pm with Lonna Cobb, N.P. and again on 10-27-2012 at 10:20 am with Dr. Karoline Caldwell.  Reports he doesn't drive, rides the bus here so he obtained MS Contin script yesterday.    Jeffery [name] wants Dr. Truett Perna and Ms. Maisie Fus to know he has finished RT and is ready to meat with the doctors in [place].

## 2012-09-21 NOTE — Telephone Encounter (Signed)
Patient returned scripts signed by Dr. Karoline Caldwell on 09-20-2012.  Request quantity of 100 percocet.

## 2012-09-24 NOTE — Progress Notes (Signed)
  Radiation Oncology         [phone] ________________________________  Name: Jeffery [name] MRN: [id number]  Date: 09/20/2012  DOB: May 07, 1960  End of Treatment Note  Diagnosis:    Metastatic Rectal Cancer  Indication for treatment:  Palliative       Radiation treatment dates:   08/31/2012-09/20/2012  Site/dose:   1) Splenic tumor / 30 Gy in 10 fractions 2) Right lower lung/bronchus / 30 Gy in 10 fractions  Beams/energy:  1) 3 field / 15 and 6 MV photons 2) 3 field / 10 and 6 MV photons  Narrative: The patient tolerated radiation treatment relatively well with minimal acute side effects. SOB/cough improved during therapy.  Plan: The patient has completed radiation treatment. The patient will return to radiation oncology clinic for routine followup in one month. I advised them to call or return sooner if they have any questions or concerns related to their recovery or treatment.  -----------------------------------  Lonie Peak, MD

## 2012-09-28 ENCOUNTER — Emergency Department (HOSPITAL_COMMUNITY): Payer: Medicare Other

## 2012-09-28 ENCOUNTER — Inpatient Hospital Stay (HOSPITAL_COMMUNITY)
Admission: EM | Admit: 2012-09-28 | Discharge: 2012-10-09 | DRG: 329 | Disposition: A | Discharge status: Home Health | Payer: Medicare Other | Attending: General Surgery | Admitting: General Surgery

## 2012-09-28 ENCOUNTER — Encounter (HOSPITAL_COMMUNITY): Payer: Self-pay | Admitting: Emergency Medicine

## 2012-09-28 ENCOUNTER — Ambulatory Visit: Payer: Medicare Other | Admitting: Nurse Practitioner

## 2012-09-28 DIAGNOSIS — R109 Unspecified abdominal pain: Secondary | ICD-10-CM

## 2012-09-28 DIAGNOSIS — E43 Unspecified severe protein-calorie malnutrition: Secondary | ICD-10-CM | POA: Insufficient documentation

## 2012-09-28 DIAGNOSIS — C800 Disseminated malignant neoplasm, unspecified: Secondary | ICD-10-CM | POA: Diagnosis present

## 2012-09-28 DIAGNOSIS — C7800 Secondary malignant neoplasm of unspecified lung: Secondary | ICD-10-CM | POA: Diagnosis present

## 2012-09-28 DIAGNOSIS — I10 Essential (primary) hypertension: Secondary | ICD-10-CM

## 2012-09-28 DIAGNOSIS — G893 Neoplasm related pain (acute) (chronic): Secondary | ICD-10-CM | POA: Diagnosis present

## 2012-09-28 DIAGNOSIS — K56609 Unspecified intestinal obstruction, unspecified as to partial versus complete obstruction: Secondary | ICD-10-CM

## 2012-09-28 DIAGNOSIS — Z9049 Acquired absence of other specified parts of digestive tract: Secondary | ICD-10-CM

## 2012-09-28 DIAGNOSIS — C19 Malignant neoplasm of rectosigmoid junction: Secondary | ICD-10-CM

## 2012-09-28 DIAGNOSIS — Z923 Personal history of irradiation: Secondary | ICD-10-CM

## 2012-09-28 DIAGNOSIS — E871 Hypo-osmolality and hyponatremia: Secondary | ICD-10-CM | POA: Diagnosis present

## 2012-09-28 DIAGNOSIS — C786 Secondary malignant neoplasm of retroperitoneum and peritoneum: Secondary | ICD-10-CM | POA: Diagnosis present

## 2012-09-28 DIAGNOSIS — K5669 Other intestinal obstruction: Principal | ICD-10-CM | POA: Diagnosis present

## 2012-09-28 DIAGNOSIS — F17200 Nicotine dependence, unspecified, uncomplicated: Secondary | ICD-10-CM | POA: Diagnosis present

## 2012-09-28 DIAGNOSIS — Z9221 Personal history of antineoplastic chemotherapy: Secondary | ICD-10-CM

## 2012-09-28 DIAGNOSIS — Z515 Encounter for palliative care: Secondary | ICD-10-CM

## 2012-09-28 DIAGNOSIS — Z79899 Other long term (current) drug therapy: Secondary | ICD-10-CM

## 2012-09-28 DIAGNOSIS — IMO0002 Reserved for concepts with insufficient information to code with codable children: Secondary | ICD-10-CM

## 2012-09-28 DIAGNOSIS — Z933 Colostomy status: Secondary | ICD-10-CM

## 2012-09-28 DIAGNOSIS — Z66 Do not resuscitate: Secondary | ICD-10-CM | POA: Diagnosis present

## 2012-09-28 DIAGNOSIS — R627 Adult failure to thrive: Secondary | ICD-10-CM | POA: Diagnosis present

## 2012-09-28 DIAGNOSIS — N1330 Unspecified hydronephrosis: Secondary | ICD-10-CM | POA: Diagnosis present

## 2012-09-28 DIAGNOSIS — C7889 Secondary malignant neoplasm of other digestive organs: Secondary | ICD-10-CM | POA: Diagnosis present

## 2012-09-28 DIAGNOSIS — C20 Malignant neoplasm of rectum: Secondary | ICD-10-CM | POA: Diagnosis present

## 2012-09-28 DIAGNOSIS — D739 Disease of spleen, unspecified: Secondary | ICD-10-CM

## 2012-09-28 LAB — CBC
MCH: 31.2 pg (ref 26.0–34.0)
MCHC: 33.1 g/dL (ref 30.0–36.0)
Platelets: 389 10*3/uL (ref 150–400)

## 2012-09-28 LAB — BASIC METABOLIC PANEL
Calcium: 9.6 mg/dL (ref 8.4–10.5)
GFR calc non Af Amer: >90 mL/min (ref >90)
Glucose, Bld: 108 mg/dL — ABNORMAL HIGH (ref 70–99)
Sodium: 133 mEq/L — ABNORMAL LOW (ref 135–145)

## 2012-09-28 MED ORDER — ONDANSETRON HCL 4 MG/2ML IJ SOLN
4.0000 mg | Freq: Once | INTRAMUSCULAR | Status: AC
  Administered 2012-09-28: 4 mg via INTRAVENOUS
  Filled 2012-09-28: qty 2

## 2012-09-28 MED ORDER — LIDOCAINE HCL 2 % EX GEL
CUTANEOUS | Status: AC
  Filled 2012-09-28: qty 10

## 2012-09-28 MED ORDER — ONDANSETRON HCL 4 MG/2ML IJ SOLN
4.0000 mg | Freq: Four times a day (QID) | INTRAMUSCULAR | Status: DC | PRN
  Administered 2012-09-28: 4 mg via INTRAVENOUS
  Filled 2012-09-28: qty 2

## 2012-09-28 MED ORDER — ENOXAPARIN SODIUM 40 MG/0.4ML ~~LOC~~ SOLN
40.0000 mg | SUBCUTANEOUS | Status: DC
  Administered 2012-09-28 – 2012-10-08 (×11): 40 mg via SUBCUTANEOUS
  Filled 2012-09-28 (×13): qty 0.4

## 2012-09-28 MED ORDER — HYDROMORPHONE HCL PF 1 MG/ML IJ SOLN
1.0000 mg | INTRAMUSCULAR | Status: DC | PRN
  Administered 2012-09-28 – 2012-10-09 (×75): 1 mg via INTRAVENOUS
  Filled 2012-09-28 (×77): qty 1

## 2012-09-28 MED ORDER — METOPROLOL TARTRATE 1 MG/ML IV SOLN
5.0000 mg | Freq: Four times a day (QID) | INTRAVENOUS | Status: DC
  Administered 2012-09-28 – 2012-10-09 (×43): 5 mg via INTRAVENOUS
  Filled 2012-09-28 (×49): qty 5

## 2012-09-28 MED ORDER — KCL IN DEXTROSE-NACL 20-5-0.9 MEQ/L-%-% IV SOLN
INTRAVENOUS | Status: DC
  Administered 2012-09-28 – 2012-10-01 (×10): via INTRAVENOUS
  Administered 2012-10-01: 125 mL/h via INTRAVENOUS
  Administered 2012-10-02 (×2): via INTRAVENOUS
  Filled 2012-09-28 (×14): qty 1000

## 2012-09-28 MED ORDER — IOHEXOL 300 MG/ML  SOLN
100.0000 mL | Freq: Once | INTRAMUSCULAR | Status: AC | PRN
  Administered 2012-09-28: 100 mL via INTRAVENOUS

## 2012-09-28 MED ORDER — HYDROMORPHONE HCL PF 1 MG/ML IJ SOLN
1.0000 mg | Freq: Once | INTRAMUSCULAR | Status: AC
  Administered 2012-09-28: 1 mg via INTRAVENOUS
  Filled 2012-09-28: qty 1

## 2012-09-28 MED ORDER — SODIUM CHLORIDE 0.9 % IV SOLN
Freq: Once | INTRAVENOUS | Status: AC
  Administered 2012-09-28: 03:00:00 via INTRAVENOUS

## 2012-09-28 MED ORDER — IOHEXOL 300 MG/ML  SOLN
50.0000 mL | Freq: Once | INTRAMUSCULAR | Status: AC | PRN
  Administered 2012-09-28: 50 mL via ORAL

## 2012-09-28 NOTE — Progress Notes (Signed)
Patient cannot activate MyChart-he does not have a computer

## 2012-09-28 NOTE — ED Notes (Signed)
Pt transported from home with c/o abdominal pain with distention. Pt is cancer patient with colostomy, no output x 24 hours.

## 2012-09-28 NOTE — ED Provider Notes (Signed)
CSN: [id number]     Arrival date & time 09/28/12  0119 History     First MD Initiated Contact with Patient 09/28/12 0214     Chief Complaint  Patient presents with  . Abdominal Pain  . Rectal Cancer   (Consider location/radiation/quality/duration/timing/severity/associated sxs/prior Treatment) HPI Comments: With a history of adenocarcinoma, rectal cancer.  Has finished his radiation therapy, reports, that he has not had any ostomy output for over 24 hours.  He has abdominal distention and bloating.  He cc been taking pain medication is awaiting referral to [facility] because his oncologist and draped her states, that he has no other options for treatment for him  Patient is a 52 y.o. male presenting with abdominal pain. The history is provided by the patient.  Abdominal Pain Pain location:  Generalized Pain quality: aching and bloating   Pain severity:  Moderate Onset quality:  Gradual Timing:  Constant Progression:  Worsening Associated symptoms: no chest pain, no chills, no fever, no nausea, no shortness of breath and no vomiting     Past Medical History  Diagnosis Date  . Hypertension     no meds  . Cancer     rectal   . 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 Multiple tooth extractions      9/11  . Portacath placement  04/12/2011    Procedure: INSERTION PORT-A-CATH;  Surgeon: Almond Lint, MD;  Location: [facility];  Service: General;  Laterality: N/A;  port a cath insertion  . Colon surgery  12/11    sbo  . Colon surgery  8/12    colon resection-iliostomy  . Colon surgery  2012    iliostomy takedown  . Colostomy  10/05/2011    Procedure: COLOSTOMY;  Surgeon: Almond Lint, MD;  Location: [facility] OR;  Service: General;  Laterality: N/A;  open colostomy  . Examination under anesthesia   10/05/2011    Procedure: EXAM UNDER ANESTHESIA;  Surgeon: Almond Lint, MD;  Location: [facility] OR;  Service: General;  Laterality: N/A;  exam under anesthesia  . Proctoscopy  10/05/2011    Procedure: PROCTOSCOPY;  Surgeon: Almond Lint, MD;  Location: [facility] OR;  Service: General;  Laterality: N/A;  rigid proctoscopy  . Laparoscopy  10/05/2011    Procedure: LAPAROSCOPY DIAGNOSTIC;  Surgeon: Almond Lint, MD;  Location: [facility] OR;  Service: General;  Laterality: N/A;  . Laparotomy  11/10/2011    Procedure: EXPLORATORY LAPAROTOMY;  Surgeon: Almond Lint, MD;  Location: [facility];  Service: General;  Laterality: N/A;  SMALL BOWEL RESECTION x 2  . Application of wound vac  11/10/2011    Procedure: APPLICATION OF WOUND VAC;  Surgeon: Almond Lint, MD;  Location: [facility];  Service: General;;   Family History  Problem Relation Age of Onset  . Cancer Mother     lung   History  Substance Use Topics  . Smoking status: Current Every Day Smoker -- 0.25 packs/day for 12 years    Types: Cigarettes  . Smokeless tobacco: Never Used  . Alcohol Use: Yes     Comment: has quit drinking since last week.     Review of Systems  Constitutional: Negative for fever and chills.  Respiratory: Negative for shortness of breath.   Cardiovascular: Negative for chest pain.  Gastrointestinal: Positive for abdominal pain and abdominal  distention. Negative for nausea and vomiting.  Skin: Negative for wound.  All other systems reviewed and are negative.    Allergies  Review of patient's allergies indicates no known allergies.  Home Medications   Current Outpatient Rx  Name  Route  Sig  Dispense  Refill  . acetaminophen (TYLENOL) 325 MG tablet   Oral   Take 1-2 tablets (325-650 mg total) by mouth every 6 (six) hours as needed for pain or fever.   80 tablet   3   . amLODipine (NORVASC) 10 MG tablet   Oral   Take 1 tablet (10 mg total) by mouth daily.   30 tablet   3   . morphine (MS CONTIN) 60 MG 12 hr tablet   Oral    Take 1 tablet (60 mg total) by mouth every 8 (eight) hours. Not to be filled until the last week of August. Can take every 12 hrs if pain improves.   90 tablet   0   . oxyCODONE-acetaminophen (PERCOCET) 10-325 MG per tablet   Oral   Take 1 tablet by mouth every 6 (six) hours as needed for pain.   100 tablet   0   . diphenoxylate-atropine (LOMOTIL) 2.5-0.025 MG per tablet   Oral   Take 1 tablet by mouth 4 (four) times daily as needed for diarrhea or loose stools.   30 tablet   2   . lidocaine-prilocaine (EMLA) cream   Topical   Apply topically as needed. Apply to [facility] site 1-2 hours prior to stick and cover with plastic wrap   30 g   prn    BP 154/104  Pulse 95  Temp(Src) 98 F (36.7 C) (Oral)  Resp 16  SpO2 98% Physical Exam  Nursing note and vitals reviewed. Constitutional: He appears well-nourished.  HENT:  Head: Normocephalic.  Eyes: Pupils are equal, round, and reactive to light.  Neck: Normal range of motion.  Cardiovascular: Regular rhythm.  Tachycardia present.   Pulmonary/Chest: Effort normal. No respiratory distress. He has no wheezes.  Abdominal: He exhibits distension. There is generalized tenderness.  No ostomy output.  Patient is abdomen is tympanic    ED Course   Procedures (including critical care time)  Labs Reviewed - No data to display Dg Abd Acute W/chest  09/28/2012   *RADIOLOGY REPORT*  Clinical Data: Rectal cancer.  Abdominal distension and abdominal pain.  ACUTE ABDOMEN SERIES (ABDOMEN 2 VIEW & CHEST 1 VIEW)  Comparison: CT of the abdomen and pelvis 08/03/2012.  Findings: Multiple pulmonary nodules are again noted throughout the lungs bilaterally, with the largest nodule measuring up to 2.9 cm in diameter in the inferior aspect of the right upper lobe immediately above the minor fissure.  No acute consolidative airspace disease.  No pleural effusions.  No evidence of pulmonary edema.  Heart size is within normal limits.  Mediastinal contours are  unremarkable.  Right subclavian single lumen power Port-A-Cath with tip terminating in the right atrium.  Supine and upright views of the abdomen demonstrate diffuse gaseous distension of both the small bowel and colon, with small bowel dilatation up to 5.1 cm in diameter.  Multiple air fluid levels are noted within the colon and small bowel on the upright views.  No definite pneumoperitoneum.   Sutures are seen projecting over the lower pelvis.  IMPRESSION: 1.  Abnormal bowel gas pattern, as above.  Findings are concerning for potential distal obstruction, likely at the level of the rectum. 2.  No definite pneumoperitoneum  at this time. 3.  Widespread metastatic disease to the lungs redemonstrated.   Original Report Authenticated By: Trudie Reed, M.D.   No diagnosis found.  MDM  Patient's acute abdomen series is concerning for obstruction.  Awaiting CT scan  Arman Filter, NP 09/28/12 [phone]

## 2012-09-28 NOTE — H&P (Signed)
This is a difficult problem.  This is likely recurrent cancer.  Not many surgical options for him that would give any meaningful improvement in his quality of life.  Peg tube may be an option to avoid NG but also needs to consider hospice options. Awaiting recommendations from Dr. Truett Perna

## 2012-09-28 NOTE — ED Provider Notes (Signed)
Results for orders placed in visit on 08/22/12  CBC WITH DIFFERENTIAL      Result Value Range   WBC 7.7  4.0 - 10.3 10e3/uL   NEUT# 6.0  1.5 - 6.5 10e3/uL   HGB 12.6 (*) 13.0 - 17.1 g/dL   HCT 78.4 (*) 69.6 - 29.5 %   Platelets 375  140 - 400 10e3/uL   MCV 99.2 (*) 79.3 - 98.0 fL   MCH 33.6 (*) 27.2 - 33.4 pg   MCHC 33.9  32.0 - 36.0 g/dL   RBC 2.84 (*) 1.32 - 4.40 10e6/uL   RDW 13.1  11.0 - 14.6 %   lymph# 0.8 (*) 0.9 - 3.3 10e3/uL   MONO# 0.9  0.1 - 0.9 10e3/uL   Eosinophils Absolute 0.0  0.0 - 0.5 10e3/uL   Basophils Absolute 0.0  0.0 - 0.1 10e3/uL   NEUT% 77.0 (*) 39.0 - 75.0 %   LYMPH% 10.7 (*) 14.0 - 49.0 %   MONO% 11.5  0.0 - 14.0 %   EOS% 0.5  0.0 - 7.0 %   BASO% 0.3  0.0 - 2.0 %  COMPREHENSIVE METABOLIC PANEL (CC13)      Result Value Range   Sodium 140  136 - 145 mEq/L   Potassium 3.8  3.5 - 5.1 mEq/L   Chloride 104  98 - 109 mEq/L   CO2 29  22 - 29 mEq/L   Glucose 113  70 - 140 mg/dl   BUN 10.2  7.0 - 72.5 mg/dL   Creatinine 1.1  0.7 - 1.3 mg/dL   Total Bilirubin 3.66  0.20 - 1.20 mg/dL   Alkaline Phosphatase 62  40 - 150 U/L   AST 16  5 - 34 U/L   ALT 12  0 - 55 U/L   Total Protein 8.2  6.4 - 8.3 g/dL   Albumin 3.2 (*) 3.5 - 5.0 g/dL   Calcium 9.4  8.4 - 44.0 mg/dL   Ct Abdomen Pelvis W Contrast  09/28/2012   *RADIOLOGY REPORT*  Clinical Data: Obstruction versus tumor burden.  CT ABDOMEN AND PELVIS WITH CONTRAST  Technique:  Multidetector CT imaging of the abdomen and pelvis was performed following the standard protocol during bolus administration of intravenous contrast.  Contrast: 50mL OMNIPAQUE IOHEXOL 300 MG/ML  SOLN, OMNIPAQUE IOHEXOL 300 MG/ML  SOLN  Comparison: 08/03/2012.  Findings:  BODY WALL: Linear, partly calcified soft tissue mass in the right lower gluteal region, a prior catheter entry site, measures 3.2 cm in length by 1.2 cm in diameter.  This is increased from prior when it measured 8 mm in diameter.  This extends towards a newly necrotic or  ulcerated rectal mass.  LOWER CHEST:  Lungs/pleura: There are innumerable pulmonary nodules in the lower lungs compatible with metastatic disease.  There has been marginal increase in size, for example bilobed nodule in the medial basal segment left lower lobe measures 21 x 15 mm compared to 20 x 13 mm August 22, 2012.  Improved aeration of the right lower lobe. Whereas it was previously collapsed, now there is only segmental atelectasis medially.  ABDOMEN/PELVIS:  Liver: No focal abnormality.  Biliary: No evidence of biliary obstruction or stone.  Pancreas: Unremarkable.  Spleen: Surface mass as below.  Adrenals: Unremarkable.  Kidneys and ureters: New, moderate left hydroureteronephrosis of the lower pole moiety to the level of the pelvic inlet.  There is delayed renal enhancement related to obstruction.  Simple cyst in the anterior hilar lip on the  right.  Bladder: Unremarkable.  Bowel: There is a diverting loop colostomy in the left lower quadrant.  Anastomotic sutures also present in the right lower quadrant.  There is progressive dilatation of small bowel, with air- fluid levels, to the level of these sutures.  The colon is relatively decompressed beyond this point.  The cause of the obstruction could be peritoneal metastatic disease, although discrete mass not identified. Anastomotic stricturing is an alternate possibility.  Marked circumferential rectal thickening, with the necrotic appearing mass along the right wall larger at 3 x 3.4 cm compared to 3 x 2.8 cm.  This is in continuity with the above described right gluteal mass.  Peritoneum: No evidence of pneumoperitoneum.  The mass deforming the left upper spleen, likely peroneal location, has modestly enlarged, 4.7 x 2.8 transaxial dimension compared to 4.2 x 2.3 cm previously. Although this was a site of prior fluid collection, this solid enhancing nodule has been gradually enlarging, more compatible with tumor. There is growth medially towards the greater  curvature of the stomach.  Reproductive: Indistinct margins between the thickened rectum and the prostate/seminal vesicles.  Vascular: No acute abnormality.  OSSEOUS: No acute abnormalities. No suspicious lytic or blastic lesions.  IMPRESSION:  1.  Chronic, now high-grade, distal small bowel obstruction with transition point at bowel anastomosis in the right lower quadrant. This is likely from malignant stricturing, such as with a peritoneal or mesenteric metastasis.  2.  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  This is primarily concerning for a malignant obstruction, although no causative retroperitoneal mass identified.  3.  Metastatic/recurrent disease to lungs, perisplenic peritoneum, right gluteal soft tissues, and rectum.  Tumors have modestly increased in size since 08/03/2012 comparison.   Original Report Authenticated By: Tiburcio Pea   Dg Abd Acute W/chest  09/28/2012   *RADIOLOGY REPORT*  Clinical Data: Rectal cancer.  Abdominal distension and abdominal pain.  ACUTE ABDOMEN SERIES (ABDOMEN 2 VIEW & CHEST 1 VIEW)  Comparison: CT of the abdomen and pelvis 08/03/2012.  Findings: Multiple pulmonary nodules are again noted throughout the lungs bilaterally, with the largest nodule measuring up to 2.9 cm in diameter in the inferior aspect of the right upper lobe immediately above the minor fissure.  No acute consolidative airspace disease.  No pleural effusions.  No evidence of pulmonary edema.  Heart size is within normal limits.  Mediastinal contours are unremarkable.  Right subclavian single lumen power Port-A-Cath with tip terminating in the right atrium.  Supine and upright views of the abdomen demonstrate diffuse gaseous distension of both the small bowel and colon, with small bowel dilatation up to 5.1 cm in diameter.  Multiple air fluid levels are noted within the colon and small bowel on the upright views.  No  definite pneumoperitoneum.   Sutures are seen projecting over the lower pelvis.  IMPRESSION: 1.  Abnormal bowel gas pattern, as above.  Findings are concerning for potential distal obstruction, likely at the level of the rectum. 2.  No definite pneumoperitoneum at this time. 3.  Widespread metastatic disease to the lungs redemonstrated.   Original Report Authenticated By: Trudie Reed, M.D.    Medical screening examination/treatment/procedure(s) were conducted as a shared visit with non-physician practitioner(s) and myself.  I personally evaluated the patient during the encounter  7:49 AM d/w GSU, plan NGT and will evaluate in ED for admit  Sunnie Nielsen, MD 09/28/12 [phone]

## 2012-09-28 NOTE — Progress Notes (Signed)
IP PROGRESS NOTE  Subjective:   He is well-known to me with a history of metastatic rectal cancer. He recently completed a course of palliative radiation to a left abdomen/chest wall mass. He was admitted today with a bowel obstruction. Jeffery [name] reports decreased output from the colostomy for the past several days. He last noted output at approximately 4 PM on 09/27/2012. He continues to have pain at the left flank and right lower abdomen.  Objective: Vital signs in last 24 hours: Blood pressure 153/109, pulse 95, temperature 98.1 F (36.7 C), temperature source Oral, resp. rate 18, height 5\' 4"  (1.626 m), weight 121 lb 9.6 oz (55.157 kg), SpO2 95.00%.  Intake/Output from previous day:    Physical Exam:  HEENT:  No thrush Lungs: inspiratory rhonchi at the lower posterior chest bilaterally, no respiratory distress Cardiac: regular rate and rhythm Abdomen: distended, no stool in the ostomy bag, tender in the right lower abdomen, no discrete mass Extremities: no leg edema Musculoskeletal: Tender at the left flank. 2 cm mass associated with a drain site scar at the right buttock  Portacath/PICC-without erythema  Lab Results:  Recent Labs  09/28/12 0220  WBC 10.5  HGB 11.8*  HCT 35.6*  PLT 389    BMET  Recent Labs  09/28/12 0220  NA 133*  K 3.7  CL 96  CO2 29  GLUCOSE 108*  BUN 17  CREATININE 0.82  CALCIUM 9.6    Studies/Results: Ct Abdomen Pelvis W Contrast  09/28/2012   *RADIOLOGY REPORT*  Clinical Data: Obstruction versus tumor burden.  CT ABDOMEN AND PELVIS WITH CONTRAST  Technique:  Multidetector CT imaging of the abdomen and pelvis was performed following the standard protocol during bolus administration of intravenous contrast.  Contrast: 50mL OMNIPAQUE IOHEXOL 300 MG/ML  SOLN, OMNIPAQUE IOHEXOL 300 MG/ML  SOLN  Comparison: 08/03/2012.  Findings:  BODY WALL: Linear, partly calcified soft tissue mass in the right lower gluteal region, a prior catheter  entry site, measures 3.2 cm in length by 1.2 cm in diameter.  This is increased from prior when it measured 8 mm in diameter.  This extends towards a newly necrotic or ulcerated rectal mass.  LOWER CHEST:  Lungs/pleura: There are innumerable pulmonary nodules in the lower lungs compatible with metastatic disease.  There has been marginal increase in size, for example bilobed nodule in the medial basal segment left lower lobe measures 21 x 15 mm compared to 20 x 13 mm August 22, 2012.  Improved aeration of the right lower lobe. Whereas it was previously collapsed, now there is only segmental atelectasis medially.  ABDOMEN/PELVIS:  Liver: No focal abnormality.  Biliary: No evidence of biliary obstruction or stone.  Pancreas: Unremarkable.  Spleen: Surface mass as below.  Adrenals: Unremarkable.  Kidneys and ureters: New, moderate left hydroureteronephrosis of the lower pole moiety to the level of the pelvic inlet.  There is delayed renal enhancement related to obstruction.  Simple cyst in the anterior hilar lip on the right.  Bladder: Unremarkable.  Bowel: There is a diverting loop colostomy in the left lower quadrant.  Anastomotic sutures also present in the right lower quadrant.  There is progressive dilatation of small bowel, with air- fluid levels, to the level of these sutures.  The colon is relatively decompressed beyond this point.  The cause of the obstruction could be peritoneal metastatic disease, although discrete mass not identified. Anastomotic stricturing is an alternate possibility.  Marked circumferential rectal thickening, with the necrotic appearing mass along the  right wall larger at 3 x 3.4 cm compared to 3 x 2.8 cm.  This is in continuity with the above described right gluteal mass.  Peritoneum: No evidence of pneumoperitoneum.  The mass deforming the left upper spleen, likely peroneal location, has modestly enlarged, 4.7 x 2.8 transaxial dimension compared to 4.2 x 2.3 cm previously. Although this  was a site of prior fluid collection, this solid enhancing nodule has been gradually enlarging, more compatible with tumor. There is growth medially towards the greater curvature of the stomach.  Reproductive: Indistinct margins between the thickened rectum and the prostate/seminal vesicles.  Vascular: No acute abnormality.  OSSEOUS: No acute abnormalities. No suspicious lytic or blastic lesions.  IMPRESSION:  1.  Chronic, now high-grade, distal small bowel obstruction with transition point at bowel anastomosis in the right lower quadrant. This is likely from malignant stricturing, such as with a peritoneal or mesenteric metastasis.  2.  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  This is primarily concerning for a malignant obstruction, although no causative retroperitoneal mass identified.  3.  Metastatic/recurrent disease to lungs, perisplenic peritoneum, right gluteal soft tissues, and rectum.  Tumors have modestly increased in size since 08/03/2012 comparison.   Original Report Authenticated By: Tiburcio Pea   Dg Abd Acute W/chest  09/28/2012   *RADIOLOGY REPORT*  Clinical Data: Rectal cancer.  Abdominal distension and abdominal pain.  ACUTE ABDOMEN SERIES (ABDOMEN 2 VIEW & CHEST 1 VIEW)  Comparison: CT of the abdomen and pelvis 08/03/2012.  Findings: Multiple pulmonary nodules are again noted throughout the lungs bilaterally, with the largest nodule measuring up to 2.9 cm in diameter in the inferior aspect of the right upper lobe immediately above the minor fissure.  No acute consolidative airspace disease.  No pleural effusions.  No evidence of pulmonary edema.  Heart size is within normal limits.  Mediastinal contours are unremarkable.  Right subclavian single lumen power Port-A-Cath with tip terminating in the right atrium.  Supine and upright views of the abdomen demonstrate diffuse gaseous distension of both the small  bowel and colon, with small bowel dilatation up to 5.1 cm in diameter.  Multiple air fluid levels are noted within the colon and small bowel on the upright views.  No definite pneumoperitoneum.   Sutures are seen projecting over the lower pelvis.  IMPRESSION: 1.  Abnormal bowel gas pattern, as above.  Findings are concerning for potential distal obstruction, likely at the level of the rectum. 2.  No definite pneumoperitoneum at this time. 3.  Widespread metastatic disease to the lungs redemonstrated.   Original Report Authenticated By: Trudie Reed, M.D.    Medications: I have reviewed the patient's current medications.  Assessment/Plan: 1.metastatic rectal cancer-progressive following multiple systemic therapies.  2.painful lesion involving the spleen/pleura-status post palliative radiation completed 09/20/2012  3.small bowel obstruction confirmed on a CT of the abdomen 09/28/2012   Jeffery [name] is admitted with a bowel obstruction. 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 He has progressive metastatic rectal cancer involving multiple sites, though he is not symptomatic from the tumor involving the lungs. Systemic treatment options are limited. We were considering a referral to Dr. Maryruth Hancock at [facility] for the Aflibercept/Xeloda trial. He will not be a candidate for a clinical trial unless his bowel function returns. Recommendations: 1. Bowel rest 2. Narcotic analgesics for pain 3. Discussed indication for palliative surgery  with Dr. [name] if there is no improvement after several days of bowel rest. 4. Hospice referral if bowel function cannot be restored   LOS: 0 days   Jeffery [name]  09/28/2012, 7:59 PM

## 2012-09-28 NOTE — ED Notes (Signed)
Pt has rectal CA for which he finished his last radiation tx on Wednesday. States his doctor is sending him to [facility] bc they 'can't do anything else for him.' Pt has not had any output in his colostomy in 24 hrs. Abdomen is distended and painful to palpate. Pt takes morphine at home but has never taken a stool softener w/ it.

## 2012-09-28 NOTE — H&P (Signed)
Oncology: Dr. Lonie Peak and Dr. Mancel Bale  Chief Complaint: abdominal pain, nausea  HPI: Jeffery [name] is a 52 year old AAM with a history of stage III rectal cancer s/p pelvic radiation, laparoscopic lower anterior resection with a diverting loop ileostomy 12/2009,  Metastatic lung cancer diagnosed in 02/2011, strangulated peristomal hernia and small bowel obstruction s/p reduction of hernia, omentectomy, exploratory laparotomy, small bowel resection in 01/2010, left adrenal nodule, failure to thrive, HTN, diverting loop colostomy 09/2011.  He was hospitalized 09/2011 for SBO which resolved with conservative management, which reoccurred in September of 2013 and subsequently underwent an exploratory laparotomy with SBR x2 and incidental appendectomy.  He presented today to [facility] with abdominal pain and nausea which began suddenly yesterday after eating french fries. Modifying factors include; self inflicted vomiting which did not help.  He reports emptying colostomy bag yesterday and letting the air out.  He has not had any gas or stool in ostomy bag since.  He denies fever, chills.  His appetite is fair, he has lost 10lbs and is seeing dietician to help with weight loss following radiation.  He denies melena or hematochezia.  Denies dysuria.  At present time, he has a NGT set to low intermittent suction, he continues to have abdominal pain, but is a little better.  He takes chronic pain medication for back pain and chronic pain secondary to cancer.    Social: lives with mom.  2cig/day smoker.  Denies recent alcohol use.   Past Medical History  Diagnosis Date  . Hypertension     no meds  . Cancer     rectal   . 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  10/12/2011    Past Surgical History  Procedure Laterality Date  . Multiple tooth extractions      9/11  . Portacath placement   04/12/2011    Procedure: INSERTION PORT-A-CATH;  Surgeon: Almond Lint, MD;  Location: [facility];  Service: General;  Laterality: N/A;  port a cath insertion  . Colon surgery  12/11    sbo  . Colon surgery  8/12    colon resection-iliostomy  . Colon surgery  2012    iliostomy takedown  . Colostomy  10/05/2011    Procedure: COLOSTOMY;  Surgeon: Almond Lint, MD;  Location: [facility];  Service: General;  Laterality: N/A;  open colostomy  . Examination under anesthesia  10/05/2011    Procedure: EXAM UNDER ANESTHESIA;  Surgeon: Almond Lint, MD;  Location: [facility];  Service: General;  Laterality: N/A;  exam under anesthesia  . Proctoscopy  10/05/2011    Procedure: PROCTOSCOPY;  Surgeon: Almond Lint, MD;  Location: [facility];  Service: General;  Laterality: N/A;  rigid proctoscopy  . Laparoscopy  10/05/2011    Procedure: LAPAROSCOPY DIAGNOSTIC;  Surgeon: Almond Lint, MD;  Location: [facility];  Service: General;  Laterality: N/A;  . Laparotomy  11/10/2011    Procedure: EXPLORATORY LAPAROTOMY;  Surgeon: Almond Lint, MD;  Location: [facility];  Service: General;  Laterality: N/A;  SMALL BOWEL RESECTION x 2  . Application of wound vac  11/10/2011    Procedure: APPLICATION OF WOUND VAC;  Surgeon: Almond Lint, MD;  Location: [facility];  Service: General;;    Family History  Problem Relation Age of Onset  . Cancer Mother     lung  . Cancer Paternal Uncle  Social History:  reports that he has been smoking Cigarettes.  He has a 3 pack-year smoking history. He has never used smokeless tobacco. He reports that  drinks alcohol. He reports that he does not use illicit drugs.  Allergies: No Known Allergies  Medications Prior to Admission  Medication Sig Dispense Refill  . acetaminophen (TYLENOL) 325 MG tablet Take 1-2 tablets (325-650 mg total) by mouth every 6 (six) hours as needed for pain or fever.  80 tablet  3  . amLODipine (NORVASC) 10 MG tablet Take 1 tablet (10 mg total) by mouth daily.  30 tablet   3  . morphine (MS CONTIN) 60 MG 12 hr tablet Take 1 tablet (60 mg total) by mouth every 8 (eight) hours. Not to be filled until the last week of August. Can take every 12 hrs if pain improves.  90 tablet  0  . oxyCODONE-acetaminophen (PERCOCET) 10-325 MG per tablet Take 1 tablet by mouth every 6 (six) hours as needed for pain.  100 tablet  0  . diphenoxylate-atropine (LOMOTIL) 2.5-0.025 MG per tablet Take 1 tablet by mouth 4 (four) times daily as needed for diarrhea or loose stools.  30 tablet  2  . lidocaine-prilocaine (EMLA) cream Apply topically as needed. Apply to [facility] site 1-2 hours prior to stick and cover with plastic wrap  30 g  prn    Results for orders placed during the hospital encounter of 09/28/12 (from the past 48 hour(s))  CBC     Status: Abnormal   Collection Time    09/28/12  2:20 AM      Result Value Range   WBC 10.5  4.0 - 10.5 K/uL   RBC 3.78 (*) 4.22 - 5.81 MIL/uL   Hemoglobin 11.8 (*) 13.0 - 17.0 g/dL   HCT 16.1 (*) 09.6 - 04.5 %   MCV 94.2  78.0 - 100.0 fL   MCH 31.2  26.0 - 34.0 pg   MCHC 33.1  30.0 - 36.0 g/dL   RDW 40.9  81.1 - 91.4 %   Platelets 389  150 - 400 K/uL   Ct Abdomen Pelvis W Contrast  09/28/2012   *RADIOLOGY REPORT*  Clinical Data: Obstruction versus tumor burden.  CT ABDOMEN AND PELVIS WITH CONTRAST  Technique:  Multidetector CT imaging of the abdomen and pelvis was performed following the standard protocol during bolus administration of intravenous contrast.  Contrast: 50mL OMNIPAQUE IOHEXOL 300 MG/ML  SOLN, OMNIPAQUE IOHEXOL 300 MG/ML  SOLN  Comparison: 08/03/2012.  Findings:  BODY WALL: Linear, partly calcified soft tissue mass in the right lower gluteal region, a prior catheter entry site, measures 3.2 cm in length by 1.2 cm in diameter.  This is increased from prior when it measured 8 mm in diameter.  This extends towards a newly necrotic or ulcerated rectal mass.  LOWER CHEST:  Lungs/pleura: There are innumerable pulmonary nodules in the lower  lungs compatible with metastatic disease.  There has been marginal increase in size, for example bilobed nodule in the medial basal segment left lower lobe measures 21 x 15 mm compared to 20 x 13 mm August 22, 2012.  Improved aeration of the right lower lobe. Whereas it was previously collapsed, now there is only segmental atelectasis medially.  ABDOMEN/PELVIS:  Liver: No focal abnormality.  Biliary: No evidence of biliary obstruction or stone.  Pancreas: Unremarkable.  Spleen: Surface mass as below.  Adrenals: Unremarkable.  Kidneys and ureters: New, moderate left hydroureteronephrosis of the lower pole moiety to  the level of the pelvic inlet.  There is delayed renal enhancement related to obstruction.  Simple cyst in the anterior hilar lip on the right.  Bladder: Unremarkable.  Bowel: There is a diverting loop colostomy in the left lower quadrant.  Anastomotic sutures also present in the right lower quadrant.  There is progressive dilatation of small bowel, with air- fluid levels, to the level of these sutures.  The colon is relatively decompressed beyond this point.  The cause of the obstruction could be peritoneal metastatic disease, although discrete mass not identified. Anastomotic stricturing is an alternate possibility.  Marked circumferential rectal thickening, with the necrotic appearing mass along the right wall larger at 3 x 3.4 cm compared to 3 x 2.8 cm.  This is in continuity with the above described right gluteal mass.  Peritoneum: No evidence of pneumoperitoneum.  The mass deforming the left upper spleen, likely peroneal location, has modestly enlarged, 4.7 x 2.8 transaxial dimension compared to 4.2 x 2.3 cm previously. Although this was a site of prior fluid collection, this solid enhancing nodule has been gradually enlarging, more compatible with tumor. There is growth medially towards the greater curvature of the stomach.  Reproductive: Indistinct margins between the thickened rectum and the  prostate/seminal vesicles.  Vascular: No acute abnormality.  OSSEOUS: No acute abnormalities. No suspicious lytic or blastic lesions.  IMPRESSION:  1.  Chronic, now high-grade, distal small bowel obstruction with transition point at bowel anastomosis in the right lower quadrant. This is likely from malignant stricturing, such as with a peritoneal or mesenteric metastasis.  2.  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  This is primarily concerning for a malignant obstruction, although no causative retroperitoneal mass identified.  3.  Metastatic/recurrent disease to lungs, perisplenic peritoneum, right gluteal soft tissues, and rectum.  Tumors have modestly increased in size since 08/03/2012 comparison.   Original Report Authenticated By: Tiburcio Pea   Dg Abd Acute W/chest  09/28/2012   *RADIOLOGY REPORT*  Clinical Data: Rectal cancer.  Abdominal distension and abdominal pain.  ACUTE ABDOMEN SERIES (ABDOMEN 2 VIEW & CHEST 1 VIEW)  Comparison: CT of the abdomen and pelvis 08/03/2012.  Findings: Multiple pulmonary nodules are again noted throughout the lungs bilaterally, with the largest nodule measuring up to 2.9 cm in diameter in the inferior aspect of the right upper lobe immediately above the minor fissure.  No acute consolidative airspace disease.  No pleural effusions.  No evidence of pulmonary edema.  Heart size is within normal limits.  Mediastinal contours are unremarkable.  Right subclavian single lumen power Port-A-Cath with tip terminating in the right atrium.  Supine and upright views of the abdomen demonstrate diffuse gaseous distension of both the small bowel and colon, with small bowel dilatation up to 5.1 cm in diameter.  Multiple air fluid levels are noted within the colon and small bowel on the upright views.  No definite pneumoperitoneum.   Sutures are seen projecting over the lower pelvis.  IMPRESSION: 1.   Abnormal bowel gas pattern, as above.  Findings are concerning for potential distal obstruction, likely at the level of the rectum. 2.  No definite pneumoperitoneum at this time. 3.  Widespread metastatic disease to the lungs redemonstrated.   Original Report Authenticated By: Trudie Reed, M.D.    Review of Systems  Constitutional: Positive for weight loss and malaise/fatigue. Negative for fever, chills and diaphoresis.  Eyes: Negative for blurred vision  and photophobia.  Respiratory: Negative for shortness of breath and wheezing.   Cardiovascular: Negative for chest pain and palpitations.  Gastrointestinal: Positive for nausea and abdominal pain. Negative for vomiting, blood in stool and melena.  Genitourinary: Negative for dysuria, hematuria and flank pain.  Musculoskeletal: Positive for back pain.  Neurological: Positive for weakness. Negative for dizziness, sensory change, speech change, focal weakness and headaches.    Blood pressure 161/110, pulse 101, temperature 98.1 F (36.7 C), temperature source Oral, resp. rate 16, height 5\' 4"  (1.626 m), weight 121 lb 9.6 oz (55.157 kg), SpO2 97.00%. Physical Exam  Constitutional: He appears ill. No distress.  HENT:  Head: Normocephalic and atraumatic.  Eyes: Conjunctivae and EOM are normal. Pupils are equal, round, and reactive to light.  Cardiovascular: Normal rate, regular rhythm and intact distal pulses.  Exam reveals no gallop and no friction rub.   No murmur heard. Respiratory: Effort normal and breath sounds normal. No respiratory distress. He has no wheezes. He has no rales. He exhibits no tenderness.  GI: Bowel sounds are normal. He exhibits distension. He exhibits no mass. There is tenderness. There is no guarding.   stoma is pink without stool or gas in ostomy bag.  Musculoskeletal: He exhibits no edema and no tenderness.  Neurological: He is alert.  Skin: Skin is warm and dry. No rash noted. He is not diaphoretic. No pallor.     Assessment/Plan Rectal adenocarcinoma with metastasis to lung, peritoneum, right gluteal soft tissues Hx multiple bowel resection, hernia repair at peristomal site, colostomy Hydronephrosis on CT suggested to be 2/2 malignant obstruction Failure to thrive  Hypertension  Recurrent small bowel obstruction, transition point noted at anastomosis site -NGT to low intermittent suction, NPO, pain medication and bowel rest.  I will discuss with Dr. [name] further planning, consider consult to Dr. Truett Perna and perhaps palliative care consult.  Consider nutritional support.  -IVF -CBC, BMP -hold home medication, metoprolol q6h for bp control -VTE prophylaxis       Jaecob Lowden, [facility] Pager [phone]  09/28/2012, 10:11 AM

## 2012-09-28 NOTE — ED Notes (Signed)
Bed: WA02 Expected date:  Expected time:  Means of arrival:  Comments: hold 

## 2012-09-29 ENCOUNTER — Inpatient Hospital Stay (HOSPITAL_COMMUNITY): Payer: Medicare Other

## 2012-09-29 LAB — BASIC METABOLIC PANEL
BUN: 14 mg/dL (ref 6–23)
Calcium: 8.6 mg/dL (ref 8.4–10.5)
GFR calc Af Amer: >90 mL/min (ref >90)
GFR calc non Af Amer: >90 mL/min (ref >90)
Potassium: 4.3 mEq/L (ref 3.5–5.1)
Sodium: 135 mEq/L (ref 135–145)

## 2012-09-29 LAB — CBC
Hemoglobin: 11.1 g/dL — ABNORMAL LOW (ref 13.0–17.0)
MCHC: 32.9 g/dL (ref 30.0–36.0)
Platelets: 337 10*3/uL (ref 150–400)

## 2012-09-29 NOTE — Progress Notes (Signed)
Patient ID: Jeffery [name], male   DOB: [birth year], 52 y.o.   MRN: [id number]  Subjective: Pt states he is feeling better and passing flatus, but no gas in ostomy bag.  out of NGT since it was placed yesterday.    Objective:  Vital signs:  Filed Vitals:   09/28/12 1814 09/28/12 2134 09/29/12 0211 09/29/12 0601  BP: 153/109 158/102 150/99 139/74  Pulse: 95 101 95 90  Temp: 98.1 F (36.7 C) 98.2 F (36.8 C) 98.3 F (36.8 C) 98.1 F (36.7 C)  TempSrc: Oral Oral Oral Oral  Resp: 18 18 18 18   Height:      Weight:      SpO2: 95% 92% 93% 94%    Last BM Date: 09/27/12  Intake/Output   Yesterday:  08/14 0701 - 08/15 0700 In: 2612.5 [I.V.:2612.5] Out: 600 [Urine:450; Emesis/NG output:150] This shift:     Physical Exam:  General: Pt awake/alert/oriented x3 in no acute distress Cardiovascular: Normal rate, regular rhythm and intact distal pulses. Exam reveals no gallop and no friction rub.  No murmur heard. Respiratory: Effort normal and breath sounds normal. No respiratory distress. He has no wheezes. He has no rales. He exhibits no tenderness. MS: Normal AROM mjr joints.  No obvious deformity Abdomen: Bowel sounds are normal. He exhibits distension. He exhibits no mass. There is tenderness. There is no guarding.  stoma is pink without stool or gas in ostomy bag.  Ext:  SCDs BLE.  No mjr edema.  No cyanosis Skin: No petechiae / purpura  Results:   Labs: Results for orders placed during the hospital encounter of 09/28/12 (from the past 48 hour(s))  CBC     Status: Abnormal   Collection Time    09/28/12  2:20 AM      Result Value Range   WBC 10.5  4.0 - 10.5 K/uL   RBC 3.78 (*) 4.22 - 5.81 MIL/uL   Hemoglobin 11.8 (*) 13.0 - 17.0 g/dL   HCT 78.2 (*) 95.6 - 21.3 %   MCV 94.2  78.0 - 100.0 fL   MCH 31.2  26.0 - 34.0 pg   MCHC 33.1  30.0 - 36.0 g/dL   RDW 08.6  57.8 - 46.9 %   Platelets 389  150 - 400 K/uL  BASIC METABOLIC PANEL     Status: Abnormal   Collection  Time    09/28/12  2:20 AM      Result Value Range   Sodium 133 (*) 135 - 145 mEq/L   Potassium 3.7  3.5 - 5.1 mEq/L   Chloride 96  96 - 112 mEq/L   CO2 29  19 - 32 mEq/L   Glucose, Bld 108 (*) 70 - 99 mg/dL   BUN 17  6 - 23 mg/dL   Creatinine, Ser 6.29  0.50 - 1.35 mg/dL   Calcium 9.6  8.4 - 52.8 mg/dL   GFR calc non Af Amer >90  >90 mL/min   GFR calc Af Amer >90  >90 mL/min   Comment: (NOTE)     The eGFR has been calculated using the CKD EPI equation.     This calculation has not been validated in all clinical situations.     eGFR's persistently <90 mL/min signify possible Chronic Kidney     Disease.  BASIC METABOLIC PANEL     Status: Abnormal   Collection Time    09/29/12  7:00 AM      Result Value Range   Sodium 135  135 - 145 mEq/L   Potassium 4.3  3.5 - 5.1 mEq/L   Chloride 103  96 - 112 mEq/L   CO2 29  19 - 32 mEq/L   Glucose, Bld 138 (*) 70 - 99 mg/dL   BUN 14  6 - 23 mg/dL   Creatinine, Ser 1.30  0.50 - 1.35 mg/dL   Calcium 8.6  8.4 - 86.5 mg/dL   GFR calc non Af Amer >90  >90 mL/min   GFR calc Af Amer >90  >90 mL/min   Comment: (NOTE)     The eGFR has been calculated using the CKD EPI equation.     This calculation has not been validated in all clinical situations.     eGFR's persistently <90 mL/min signify possible Chronic Kidney     Disease.  CBC     Status: Abnormal   Collection Time    09/29/12  7:00 AM      Result Value Range   WBC 6.1  4.0 - 10.5 K/uL   RBC 3.58 (*) 4.22 - 5.81 MIL/uL   Hemoglobin 11.1 (*) 13.0 - 17.0 g/dL   HCT 78.4 (*) 69.6 - 29.5 %   MCV 94.1  78.0 - 100.0 fL   MCH 31.0  26.0 - 34.0 pg   MCHC 32.9  30.0 - 36.0 g/dL   RDW 28.4  13.2 - 44.0 %   Platelets 337  150 - 400 K/uL    Imaging / Studies: Ct Abdomen Pelvis W Contrast  09/28/2012   *RADIOLOGY REPORT*  Clinical Data: Obstruction versus tumor burden.  CT ABDOMEN AND PELVIS WITH CONTRAST  Technique:  Multidetector CT imaging of the abdomen and pelvis was performed following the  standard protocol during bolus administration of intravenous contrast.  Contrast: 50mL OMNIPAQUE IOHEXOL 300 MG/ML  SOLN, OMNIPAQUE IOHEXOL 300 MG/ML  SOLN  Comparison: 08/03/2012.  Findings:  BODY WALL: Linear, partly calcified soft tissue mass in the right lower gluteal region, a prior catheter entry site, measures 3.2 cm in length by 1.2 cm in diameter.  This is increased from prior when it measured 8 mm in diameter.  This extends towards a newly necrotic or ulcerated rectal mass.  LOWER CHEST:  Lungs/pleura: There are innumerable pulmonary nodules in the lower lungs compatible with metastatic disease.  There has been marginal increase in size, for example bilobed nodule in the medial basal segment left lower lobe measures 21 x 15 mm compared to 20 x 13 mm August 22, 2012.  Improved aeration of the right lower lobe. Whereas it was previously collapsed, now there is only segmental atelectasis medially.  ABDOMEN/PELVIS:  Liver: No focal abnormality.  Biliary: No evidence of biliary obstruction or stone.  Pancreas: Unremarkable.  Spleen: Surface mass as below.  Adrenals: Unremarkable.  Kidneys and ureters: New, moderate left hydroureteronephrosis of the lower pole moiety to the level of the pelvic inlet.  There is delayed renal enhancement related to obstruction.  Simple cyst in the anterior hilar lip on the right.  Bladder: Unremarkable.  Bowel: There is a diverting loop colostomy in the left lower quadrant.  Anastomotic sutures also present in the right lower quadrant.  There is progressive dilatation of small bowel, with air- fluid levels, to the level of these sutures.  The colon is relatively decompressed beyond this point.  The cause of the obstruction could be peritoneal metastatic disease, although discrete mass not identified. Anastomotic stricturing is an alternate possibility.  Marked circumferential rectal thickening, with the necrotic  appearing mass along the right wall larger at 3 x 3.4 cm compared  to 3 x 2.8 cm.  This is in continuity with the above described right gluteal mass.  Peritoneum: No evidence of pneumoperitoneum.  The mass deforming the left upper spleen, likely peroneal location, has modestly enlarged, 4.7 x 2.8 transaxial dimension compared to 4.2 x 2.3 cm previously. Although this was a site of prior fluid collection, this solid enhancing nodule has been gradually enlarging, more compatible with tumor. There is growth medially towards the greater curvature of the stomach.  Reproductive: Indistinct margins between the thickened rectum and the prostate/seminal vesicles.  Vascular: No acute abnormality.  OSSEOUS: No acute abnormalities. No suspicious lytic or blastic lesions.  IMPRESSION:  1.  Chronic, now high-grade, distal small bowel obstruction with transition point at bowel anastomosis in the right lower quadrant. This is likely from malignant stricturing, such as with a peritoneal or mesenteric metastasis.  2.  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  This is primarily concerning for a malignant obstruction, although no causative retroperitoneal mass identified.  3.  Metastatic/recurrent disease to lungs, perisplenic peritoneum, right gluteal soft tissues, and rectum.  Tumors have modestly increased in size since 08/03/2012 comparison.   Original Report Authenticated By: Tiburcio Pea   Dg Abd Acute W/chest  09/28/2012   *RADIOLOGY REPORT*  Clinical Data: Rectal cancer.  Abdominal distension and abdominal pain.  ACUTE ABDOMEN SERIES (ABDOMEN 2 VIEW & CHEST 1 VIEW)  Comparison: CT of the abdomen and pelvis 08/03/2012.  Findings: Multiple pulmonary nodules are again noted throughout the lungs bilaterally, with the largest nodule measuring up to 2.9 cm in diameter in the inferior aspect of the right upper lobe immediately above the minor fissure.  No acute consolidative airspace disease.  No pleural effusions.   No evidence of pulmonary edema.  Heart size is within normal limits.  Mediastinal contours are unremarkable.  Right subclavian single lumen power Port-A-Cath with tip terminating in the right atrium.  Supine and upright views of the abdomen demonstrate diffuse gaseous distension of both the small bowel and colon, with small bowel dilatation up to 5.1 cm in diameter.  Multiple air fluid levels are noted within the colon and small bowel on the upright views.  No definite pneumoperitoneum.   Sutures are seen projecting over the lower pelvis.  IMPRESSION: 1.  Abnormal bowel gas pattern, as above.  Findings are concerning for potential distal obstruction, likely at the level of the rectum. 2.  No definite pneumoperitoneum at this time. 3.  Widespread metastatic disease to the lungs redemonstrated.   Original Report Authenticated By: Trudie Reed, M.D.    Medications / Allergies: per chart  Assessment/Plan Rectal adenocarcinoma with metastasis to lung, peritoneum, right gluteal soft tissues  Hx multiple bowel resection, hernia repair at peristomal site, colostomy  Hydronephrosis on CT suggested to be 2/2 malignant obstruction  Failure to thrive  Hypertension  Recurrent small bowel obstruction, transition point noted at anastomosis site -continue with bowel rest, NGT, repeat XR today -pain control -possible palliative surgery if no improvement with above modifications.  -hospice referral if bowel function cannot be restored.  Ashok Norris, [facility] Pager [phone] Office [phone]  09/29/2012 8:16 AM   Says that he feels better today and less distended.  Surgical options are limited if he does not open up with bowel rest and NG.

## 2012-09-29 NOTE — Progress Notes (Signed)
Pt reports accidentally pulling off NGT while in the BR. NP made aware & instructs to reinsert NGT to LIWS. Tube  Placement checked with auscultation.Minimal amount of dark brown drainage noted on tubing. Pt medicated for pain as requested.

## 2012-09-29 NOTE — Progress Notes (Signed)
IP PROGRESS NOTE  Subjective:   He feels better this morning. He noted a small amount of gas in the ostomy bag last night. No stool output.  Objective: Vital signs in last 24 hours: Blood pressure 139/74, pulse 90, temperature 98.1 F (36.7 C), temperature source Oral, resp. rate 18, height 5\' 4"  (1.626 m), weight 121 lb 9.6 oz (55.157 kg), SpO2 94.00%.  Intake/Output from previous day: 08/14 0701 - 08/15 0700 In: 2612.5 [I.V.:2612.5] Out: 600 [Urine:450; Emesis/NG output:150]  Physical Exam:   Abdomen: distended, no stool in the ostomy bag Extremities: no leg edema   Portacath/PICC-without erythema  Lab Results:  Recent Labs  09/28/12 0220 09/29/12 0700  WBC 10.5 6.1  HGB 11.8* 11.1*  HCT 35.6* 33.7*  PLT 389 337    BMET  Recent Labs  09/28/12 0220 09/29/12 0700  NA 133* 135  K 3.7 4.3  CL 96 103  CO2 29 29  GLUCOSE 108* 138*  BUN 17 14  CREATININE 0.82 0.89  CALCIUM 9.6 8.6    Studies/Results: Ct Abdomen Pelvis W Contrast  09/28/2012   *RADIOLOGY REPORT*  Clinical Data: Obstruction versus tumor burden.  CT ABDOMEN AND PELVIS WITH CONTRAST  Technique:  Multidetector CT imaging of the abdomen and pelvis was performed following the standard protocol during bolus administration of intravenous contrast.  Contrast: 50mL OMNIPAQUE IOHEXOL 300 MG/ML  SOLN, OMNIPAQUE IOHEXOL 300 MG/ML  SOLN  Comparison: 08/03/2012.  Findings:  BODY WALL: Linear, partly calcified soft tissue mass in the right lower gluteal region, a prior catheter entry site, measures 3.2 cm in length by 1.2 cm in diameter.  This is increased from prior when it measured 8 mm in diameter.  This extends towards a newly necrotic or ulcerated rectal mass.  LOWER CHEST:  Lungs/pleura: There are innumerable pulmonary nodules in the lower lungs compatible with metastatic disease.  There has been marginal increase in size, for example bilobed nodule in the medial basal segment left lower lobe measures 21  x 15 mm compared to 20 x 13 mm August 22, 2012.  Improved aeration of the right lower lobe. Whereas it was previously collapsed, now there is only segmental atelectasis medially.  ABDOMEN/PELVIS:  Liver: No focal abnormality.  Biliary: No evidence of biliary obstruction or stone.  Pancreas: Unremarkable.  Spleen: Surface mass as below.  Adrenals: Unremarkable.  Kidneys and ureters: New, moderate left hydroureteronephrosis of the lower pole moiety to the level of the pelvic inlet.  There is delayed renal enhancement related to obstruction.  Simple cyst in the anterior hilar lip on the right.  Bladder: Unremarkable.  Bowel: There is a diverting loop colostomy in the left lower quadrant.  Anastomotic sutures also present in the right lower quadrant.  There is progressive dilatation of small bowel, with air- fluid levels, to the level of these sutures.  The colon is relatively decompressed beyond this point.  The cause of the obstruction could be peritoneal metastatic disease, although discrete mass not identified. Anastomotic stricturing is an alternate possibility.  Marked circumferential rectal thickening, with the necrotic appearing mass along the right wall larger at 3 x 3.4 cm compared to 3 x 2.8 cm.  This is in continuity with the above described right gluteal mass.  Peritoneum: No evidence of pneumoperitoneum.  The mass deforming the left upper spleen, likely peroneal location, has modestly enlarged, 4.7 x 2.8 transaxial dimension compared to 4.2 x 2.3 cm previously. Although this was a site of prior fluid collection, this solid  enhancing nodule has been gradually enlarging, more compatible with tumor. There is growth medially towards the greater curvature of the stomach.  Reproductive: Indistinct margins between the thickened rectum and the prostate/seminal vesicles.  Vascular: No acute abnormality.  OSSEOUS: No acute abnormalities. No suspicious lytic or blastic lesions.  IMPRESSION:  1.  Chronic, now high-grade,  distal small bowel obstruction with transition point at bowel anastomosis in the right lower quadrant. This is likely from malignant stricturing, such as with a peritoneal or mesenteric metastasis.  2.  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  This is primarily concerning for a malignant obstruction, although no causative retroperitoneal mass identified.  3.  Metastatic/recurrent disease to lungs, perisplenic peritoneum, right gluteal soft tissues, and rectum.  Tumors have modestly increased in size since 08/03/2012 comparison.   Original Report Authenticated By: Tiburcio Pea   Dg Abd Acute W/chest  09/28/2012   *RADIOLOGY REPORT*  Clinical Data: Rectal cancer.  Abdominal distension and abdominal pain.  ACUTE ABDOMEN SERIES (ABDOMEN 2 VIEW & CHEST 1 VIEW)  Comparison: CT of the abdomen and pelvis 08/03/2012.  Findings: Multiple pulmonary nodules are again noted throughout the lungs bilaterally, with the largest nodule measuring up to 2.9 cm in diameter in the inferior aspect of the right upper lobe immediately above the minor fissure.  No acute consolidative airspace disease.  No pleural effusions.  No evidence of pulmonary edema.  Heart size is within normal limits.  Mediastinal contours are unremarkable.  Right subclavian single lumen power Port-A-Cath with tip terminating in the right atrium.  Supine and upright views of the abdomen demonstrate diffuse gaseous distension of both the small bowel and colon, with small bowel dilatation up to 5.1 cm in diameter.  Multiple air fluid levels are noted within the colon and small bowel on the upright views.  No definite pneumoperitoneum.   Sutures are seen projecting over the lower pelvis.  IMPRESSION: 1.  Abnormal bowel gas pattern, as above.  Findings are concerning for potential distal obstruction, likely at the level of the rectum. 2.  No definite pneumoperitoneum at this time. 3.   Widespread metastatic disease to the lungs redemonstrated.   Original Report Authenticated By: Trudie Reed, M.D.    Medications: I have reviewed the patient's current medications.  Assessment/Plan: 1.metastatic rectal cancer-progressive following multiple systemic therapies.  2.painful lesion involving the spleen/pleura-status post palliative radiation completed 09/20/2012  3.small bowel obstruction confirmed on a CT of the abdomen 09/28/2012  He continues bowel rest and NG tube decompression. The abdomen appears slightly less distended today, but he is not having output in the ostomy bag.  Recommendations: 1. Bowel rest, management of bowel obstruction per surgery 2. Narcotic analgesics for pain 3. Discuss indication for palliative surgery with Dr. [name] if there is no improvement after several days of bowel rest. 4. Hospice referral if bowel function cannot be restored  I discussed treatment options with him again today. He understands systemic chemotherapy options are limited, but he wants to consider clinical trial options if his bowel function can be restored. He will discuss surgical options with Dr. [name].  Please call oncology as needed over the weekend. I will see him 10/02/2012.   LOS: 1 day   Taquila Leys, Jillyn Hidden  09/29/2012, 8:13 AM

## 2012-09-30 ENCOUNTER — Inpatient Hospital Stay (HOSPITAL_COMMUNITY): Payer: Medicare Other

## 2012-09-30 DIAGNOSIS — K56609 Unspecified intestinal obstruction, unspecified as to partial versus complete obstruction: Secondary | ICD-10-CM

## 2012-09-30 NOTE — Progress Notes (Signed)
Patient ID: Jeffery [name], male   DOB: 12-09-1960, 52 y.o.   MRN: [id number]  General Surgery - [facility] - Progress Note  HD# 3  Subjective: Patient more comfortable this AM with increased output from NG tube.  No nausea.  No output from ostomy.  Objective: Vital signs in last 24 hours: Temp:  [98 F (36.7 C)-98.6 F (37 C)] 98.6 F (37 C) (08/16 0549) Pulse Rate:  [91-102] 91 (08/16 0549) Resp:  [18] 18 (08/16 0549) BP: (143-148)/(95-97) 148/96 mmHg (08/16 0549) SpO2:  [93 %-97 %] 97 % (08/16 0549) Last BM Date: 09/27/12  Intake/Output from previous day: 08/15 0701 - 08/16 0700 In: 2970.8 [I.V.:2970.8] Out: 1800 [Urine:1075; Emesis/NG output:725]  Exam: HEENT - clear, not icteric Neck - soft Chest - clear bilaterally Cor - RRR, no murmur Abd - moderately distended, quiet; minimal tenderness; no succus in ostomy bag Ext - no significant edema Neuro - grossly intact, no focal deficits  Lab Results:   Recent Labs  09/28/12 0220 09/29/12 0700  WBC 10.5 6.1  HGB 11.8* 11.1*  HCT 35.6* 33.7*  PLT 389 337     Recent Labs  09/28/12 0220 09/29/12 0700  NA 133* 135  K 3.7 4.3  CL 96 103  CO2 29 29  GLUCOSE 108* 138*  BUN 17 14  CREATININE 0.82 0.89  CALCIUM 9.6 8.6    Studies/Results: Dg Abd 1 View  09/29/2012   *RADIOLOGY REPORT*  Clinical Data: Follow up bowel obstruction.  Current history of metastatic rectal carcinoma.  ABDOMEN - 1 VIEW  Comparison: Acute abdomen series and CT abdomen and pelvis yesterday.  Findings: Chronic massively distended loop of small bowel in the upper abdomen is slightly less distended than on the examinations yesterday.  Remaining dilated small bowel loops have also slightly decreased in caliber since yesterday.  Oral contrast material given at the time of yesterday's CT has progressed into the stool-filled cecum.  The colon remains decompressed throughout.  There is no suggestion of free intraperitoneal air on  the supine image. Surgical and anastomotic suture material is again noted low in the pelvis.  Phleboliths are present both sides of the pelvis.  Mild degenerative changes are again noted involving the lumbar spine.  IMPRESSION: Slight improvement since yesterday in the chronic small bowel obstruction as the small bowel is slightly less distended, including the chronic massively distended loop of small bowel in the upper abdomen.   Original Report Authenticated By: Hulan Saas, M.D.    Assessment / Plan: 1.  Small bowel obstruction likely secondary to metastatic carcinoma  Continue NG decompression  IVF  Ambulating  Monitor over weekend.  Dr. Truett Perna and Dr. [name] to evaluate Monday and decide on further interventions if necessary.  Velora Heckler, MD, [facility] Office: [phone]  09/30/2012

## 2012-10-01 NOTE — Progress Notes (Signed)
Patient ID: Jeffery [name], male   DOB: [birth year], 52 y.o.   MRN: [id number]  General Surgery - [facility] - Progress Note  HD# 4  Subjective: Patient without complaints this AM.  Small succus in bag yesterday - no output since bag changed yesterday.  NG working - no nausea or emesis.  Ambulatory - wants to go outside today.  Family at bedside.  Objective: Vital signs in last 24 hours: Temp:  [98 F (36.7 C)-98.4 F (36.9 C)] 98.3 F (36.8 C) (08/17 0515) Pulse Rate:  [87-100] 90 (08/17 0515) Resp:  [18] 18 (08/17 0515) BP: (146-160)/(89-116) 153/99 mmHg (08/17 0623) SpO2:  [95 %-97 %] 95 % (08/17 0515) Last BM Date: 09/27/12  Intake/Output from previous day: 08/16 0701 - 08/17 0700 In: 3000 [I.V.:3000] Out: 2350 [Urine:1000; Emesis/NG output:1350]  Exam: HEENT - clear, not icteric Neck - soft Chest - clear bilaterally Cor - RRR, no murmur Abd - markedly distended, active high-pitched BS present; NG with bilious / feculent output; no tenderness Ext - no significant edema Neuro - grossly intact, no focal deficits  Lab Results:   Recent Labs  09/29/12 0700  WBC 6.1  HGB 11.1*  HCT 33.7*  PLT 337     Recent Labs  09/29/12 0700  NA 135  K 4.3  CL 103  CO2 29  GLUCOSE 138*  BUN 14  CREATININE 0.89  CALCIUM 8.6    Studies/Results: Dg Abd 1 View  09/30/2012   *RADIOLOGY REPORT*  Clinical Data: Follow-up small bowel obstruction.  ABDOMEN - 1 VIEW  Comparison: 09/29/2012  Findings: There is marked dilation of multiple loops of small bowel, similar to the prior exam.  The some residual contrast is noted in the right lower quadrant, which may reside in the right colon or dilated loops small bowel, the former is favored.  There is no gross free air on the supine exam.  An enteric tube curls within the stomach.  Mild atelectasis is noted at the lung bases.  IMPRESSION: Persistent high-grade small bowel obstruction.   Original Report Authenticated By:  Amie Portland, M.D.   Dg Abd 1 View  09/29/2012   *RADIOLOGY REPORT*  Clinical Data: Follow up bowel obstruction.  Current history of metastatic rectal carcinoma.  ABDOMEN - 1 VIEW  Comparison: Acute abdomen series and CT abdomen and pelvis yesterday.  Findings: Chronic massively distended loop of small bowel in the upper abdomen is slightly less distended than on the examinations yesterday.  Remaining dilated small bowel loops have also slightly decreased in caliber since yesterday.  Oral contrast material given at the time of yesterday's CT has progressed into the stool-filled cecum.  The colon remains decompressed throughout.  There is no suggestion of free intraperitoneal air on the supine image. Surgical and anastomotic suture material is again noted low in the pelvis.  Phleboliths are present both sides of the pelvis.  Mild degenerative changes are again noted involving the lumbar spine.  IMPRESSION: Slight improvement since yesterday in the chronic small bowel obstruction as the small bowel is slightly less distended, including the chronic massively distended loop of small bowel in the upper abdomen.   Original Report Authenticated By: Hulan Saas, M.D.    Assessment / Plan: 1. Small bowel obstruction likely secondary to metastatic carcinoma   Continue NG decompression   IV hydration  Ambulating - may go outside today with family member  Monitor over weekend. Dr. Truett Perna and Dr. [name] to evaluate Monday and decide  on further interventions if necessary.  Velora Heckler, MD, [facility] Office: [phone]  10/01/2012

## 2012-10-02 ENCOUNTER — Inpatient Hospital Stay (HOSPITAL_COMMUNITY): Payer: Medicare Other

## 2012-10-02 DIAGNOSIS — I10 Essential (primary) hypertension: Secondary | ICD-10-CM

## 2012-10-02 LAB — GLUCOSE, CAPILLARY: Glucose-Capillary: 131 mg/dL — ABNORMAL HIGH (ref 70–99)

## 2012-10-02 MED ORDER — FAT EMULSION 20 % IV EMUL
250.0000 mL | INTRAVENOUS | Status: AC
  Administered 2012-10-02: 250 mL via INTRAVENOUS
  Filled 2012-10-02: qty 250

## 2012-10-02 MED ORDER — INSULIN ASPART 100 UNIT/ML ~~LOC~~ SOLN
0.0000 [IU] | SUBCUTANEOUS | Status: DC
  Administered 2012-10-02 – 2012-10-03 (×4): 1 [IU] via SUBCUTANEOUS
  Administered 2012-10-03: 16:00:00 via SUBCUTANEOUS

## 2012-10-02 MED ORDER — TRACE MINERALS CR-CU-F-FE-I-MN-MO-SE-ZN IV SOLN
INTRAVENOUS | Status: AC
  Administered 2012-10-02: 18:00:00 via INTRAVENOUS
  Filled 2012-10-02: qty 1000

## 2012-10-02 MED ORDER — KCL IN DEXTROSE-NACL 20-5-0.9 MEQ/L-%-% IV SOLN
INTRAVENOUS | Status: AC
  Administered 2012-10-02 – 2012-10-03 (×3): via INTRAVENOUS
  Filled 2012-10-02 (×4): qty 1000

## 2012-10-02 NOTE — Progress Notes (Signed)
  Subjective: Pt feeling the same today, denies nausea or vomiting.  No output from ostomy.  Up in hallways and went outside with significant other.    Objective: Vital signs in last 24 hours: Temp:  [98.3 F (36.8 C)-98.4 F (36.9 C)] 98.3 F (36.8 C) (08/18 0608) Pulse Rate:  [93-101] 101 (08/18 0608) Resp:  [18] 18 (08/18 0608) BP: (148-178)/(100-111) 166/107 mmHg (08/18 0608) SpO2:  [94 %-96 %] 96 % (08/18 0608) Last BM Date: 09/27/12  Intake/Output from previous day: 08/17 0701 - 08/18 0700 In: 3000 [I.V.:3000] Out: 1875 [Urine:1475; Emesis/NG output:400] Intake/Output this shift:   Physical Exam:  General: Pt awake/alert/oriented x3 in no acute distress  Cardiovascular: Normal rate, regular rhythm and intact distal pulses. Exam reveals no gallop and no friction rub.  No murmur heard.  Respiratory: Effort normal and breath sounds normal. No respiratory distress. He has no wheezes. He has no rales. He exhibits no tenderness.  Abdomen: Bowel sounds are normal. He exhibits distension. He exhibits no mass. There is not tenderness. There is no guarding.  stoma is pink without stool or gas in ostomy bag.  Ext: SCDs BLE. No mjr edema. 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 Kalman Drape recommendations and discuss with Dr. [name] whether palliative surgery is appropriate versus hospice   LOS: 4 days   Bonner Puna [facility]  ANP-BC Pager [phone]  10/02/2012 8:38 AM

## 2012-10-02 NOTE — Progress Notes (Signed)
PARENTERAL NUTRITION CONSULT NOTE - INITIAL  Pharmacy Consult for TNA Indication: Bowel Obstruction  No Known Allergies  Patient Measurements: Height: 5\' 4"  (162.6 cm) Weight: 121 lb 9.6 oz (55.157 kg) [name] : 59.2 Adjusted Body Weight:  Usual Weight:   Vital Signs: Temp: 99.2 F (37.3 C) (08/18 1328) Temp src: Oral (08/18 1328) BP: 163/109 mmHg (08/18 1328) Pulse Rate: 89 (08/18 1328) Intake/Output from previous day: 08/17 0701 - 08/18 0700 In: 3000 [I.V.:3000] Out: 1875 [Urine:1475; Emesis/NG output:400] Intake/Output from this shift: Total I/O In: 0  Out: 600 [Urine:600]  Labs: No results found for this basename: WBC, HGB, HCT, PLT, APTT, INR,  in the last 72 hours  No results found for this basename: NA, K, CL, CO2, GLUCOSE, BUN, CREATININE, LABCREA, CREAT24HRUR, CALCIUM, MG, PHOS, PROT, ALBUMIN, AST, ALT, ALKPHOS, BILITOT, BILIDIR, IBILI, PREALBUMIN, TRIG, CHOLHDL, CHOL,  in the last 72 hours Estimated Creatinine Clearance: 74.9 ml/min (by C-G formula based on Cr of 0.89).   No results found for this basename: GLUCAP,  in the last 72 hours  Medical History: Past Medical History  Diagnosis Date  . Hypertension     no meds  . Cancer     rectal   . 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  10/12/2011    Insulin Requirements in the past 24 hours:  0 units, no history of DM  Current Nutrition:  NPO since 8/14  Assessment: 7 yoM with hx stage III rectal cancer with metastasis to lung, peritoneum, right gluteal soft tissues s/p multiple bowel surgeries including loop ileostomy, small bowel resection, hernia repair, diverting loop colostomy 8/'13, and SBO 8/'13, 9/'13.  Presented to [facility] 8/14 with abdominal pain, nausea, and recurrent SBO confirmed by CT with obstruction likely realted to carcinomatosis.  NGT placed 8/14. No output from ostomy.   Per surgery notes 8/18, surgery planned 8/19.   No RD goals yet.   Lines:    Right chest port-a-cath IV team to place peripheral access tonight for fluids and IV medications.    Labs 8/15: Lytes:  WNL Renal: SCr 0.89, CrCl ~75 ml/min LFTs: No recent levels Weight: 55.2kg  Nutritional Goals:  1650 kCal, 85 grams of protein, 1650 ml fluid per day Clinimix E5/15 at 70 ml/hr with lipids at 41ml/hr will provide 1672 kcal and 84 g protein/day  Plan:  At 1800 today:  Start Clinimix E5/15 at 53ml/hr.  Fat emulsion at 77ml/hr.   Plan to advance as tolerated to the goal rate.  TNA to contain standard multivitamins and trace elements  Reduce IVF to 85 ml/hr  Add sensitive SSI q 4 h  TNA lab panels on Mondays & Thursdays.  F/u daily.  Order RD consult for caloric and protein needs  [name], PharmD 10/02/2012, 4:35 PM  Pager: [phone]

## 2012-10-02 NOTE — Progress Notes (Signed)
Case discussed with Dr [name].  May need ex lap / Gtube  Or bypass but complication rate extremely high.  Unfortunately his options are limited.  Will check schedule for Tuesday and look to do this with / without Dr [name] as she is available.  Keep NPO and wiil need TNA to get through operative time period. Poor prognosis overall.  Will check back in am and check OR schedule for time Tuesday.

## 2012-10-02 NOTE — Progress Notes (Signed)
Pt room smelled like cigarette smoke, pt was asked if he had been smoking and denied any. Pt was reminded of smoking policy.

## 2012-10-02 NOTE — Clinical Documentation Improvement (Signed)
THIS DOCUMENT IS NOT A PERMANENT PART OF THE MEDICAL RECORD  Please update your documentation with the medical record to reflect your response to this query. If you need help knowing how to do this please call [phone].  10/02/12  Dear Rene Paci, NP [facility]  In an effort to better capture your patient's severity of illness, reflect appropriate length of stay and utilization of resources, a review of the patient medical record has revealed the following indicators.    Based on your clinical judgment, please clarify and document in a progress note and/or discharge summary the clinical condition associated with the following supporting information:  In responding to this query please exercise your independent judgment.  The fact that a query is asked, does not imply that any particular answer is desired or expected. Please specify the  type of HTN  with possible Clinical Conditions?   " Hypertension, Essential  " Accelerated Hypertension  " Malignant Hypertension  " Or Other Condition __________________________  " Cannot Clinically Determine   Supporting Information:  Risk Factors:SBO, Rectal cancer,  Signs and Symptoms: hypertensive on admission 09/28/12 in ED> 154/104  HR 95 SBP range: 160 DBP range: 105  IV Medications: Lopressor 5 mg IV q 6 hr  Home med: Norvasc 10 mg daily  You may use possible, probable, or suspect with inpatient documentation. Possible, probable, suspected diagnoses MUST be documented at the time of discharge.  Reviewed: additional documentation in the medical record  Thank Barrie Dunker  Clinical Documentation Specialist: [phone] Health Information Management [place]

## 2012-10-03 ENCOUNTER — Encounter (HOSPITAL_COMMUNITY): Payer: Self-pay | Admitting: *Deleted

## 2012-10-03 LAB — DIFFERENTIAL
Basophils Relative: 0 % (ref 0–1)
Lymphs Abs: 0.4 10*3/uL — ABNORMAL LOW (ref 0.7–4.0)
Monocytes Absolute: 0.7 10*3/uL (ref 0.1–1.0)
Monocytes Relative: 11 % (ref 3–12)
Neutro Abs: 5.6 10*3/uL (ref 1.7–7.7)
Neutrophils Relative %: 82 % — ABNORMAL HIGH (ref 43–77)

## 2012-10-03 LAB — GLUCOSE, CAPILLARY
Glucose-Capillary: 120 mg/dL — ABNORMAL HIGH (ref 70–99)
Glucose-Capillary: 125 mg/dL — ABNORMAL HIGH (ref 70–99)

## 2012-10-03 LAB — CBC
HCT: 33 % — ABNORMAL LOW (ref 39.0–52.0)
Hemoglobin: 10.9 g/dL — ABNORMAL LOW (ref 13.0–17.0)
MCHC: 33 g/dL (ref 30.0–36.0)
RBC: 3.57 MIL/uL — ABNORMAL LOW (ref 4.22–5.81)

## 2012-10-03 LAB — TRIGLYCERIDES: Triglycerides: 73 mg/dL (ref <150)

## 2012-10-03 LAB — COMPREHENSIVE METABOLIC PANEL
ALT: 8 U/L (ref 0–53)
AST: 13 U/L (ref 0–37)
Alkaline Phosphatase: 52 U/L (ref 39–117)
CO2: 30 mEq/L (ref 19–32)
GFR calc Af Amer: >90 mL/min (ref >90)
GFR calc non Af Amer: >90 mL/min (ref >90)
Glucose, Bld: 132 mg/dL — ABNORMAL HIGH (ref 70–99)
Potassium: 3.9 mEq/L (ref 3.5–5.1)
Sodium: 134 mEq/L — ABNORMAL LOW (ref 135–145)
Total Protein: 7.7 g/dL (ref 6.0–8.3)

## 2012-10-03 MED ORDER — FAT EMULSION 20 % IV EMUL
240.0000 mL | INTRAVENOUS | Status: AC
  Administered 2012-10-03: 240 mL via INTRAVENOUS
  Filled 2012-10-03: qty 250

## 2012-10-03 MED ORDER — KCL IN DEXTROSE-NACL 20-5-0.9 MEQ/L-%-% IV SOLN
INTRAVENOUS | Status: AC
  Administered 2012-10-03 – 2012-10-04 (×2): via INTRAVENOUS
  Filled 2012-10-03 (×2): qty 1000

## 2012-10-03 MED ORDER — TRACE MINERALS CR-CU-F-FE-I-MN-MO-SE-ZN IV SOLN
INTRAVENOUS | Status: AC
  Administered 2012-10-03: 18:00:00 via INTRAVENOUS
  Filled 2012-10-03: qty 2000

## 2012-10-03 NOTE — Progress Notes (Signed)
Subjective: Pt feels fine, no significant abdominal pain, no N/V, +flatus in bag, but no BM.  Ambulating well.  NPO on TPN.    Objective: Vital signs in last 24 hours: Temp:  [98.3 F (36.8 C)-99.2 F (37.3 C)] 98.5 F (36.9 C) (08/19 0531) Pulse Rate:  [80-89] 80 (08/19 0531) Resp:  [18] 18 (08/19 0531) BP: (141-163)/(97-109) 142/97 mmHg (08/19 0531) SpO2:  [96 %-98 %] 97 % (08/19 0531) Last BM Date: 09/27/12  Intake/Output from previous day: 22-Oct-2022 0701 - 08/19 0700 In: 2399.6 [I.V.:2399.6] Out: 3300 [Urine:2300; Emesis/NG output:1000] Intake/Output this shift:    PE: Gen:  Alert, NAD, pleasant Abd: Soft, NT, mild distension, +BS, multiple abdominal scars, minimal flatus in bag, no BM, ostomy pink  Lab Results:   Recent Labs  10/03/12 0535  WBC 6.8  HGB 10.9*  HCT 33.0*  PLT 340   BMET  Recent Labs  10/03/12 0535  NA 134*  K 3.9  CL 97  CO2 30  GLUCOSE 132*  BUN 7  CREATININE 0.98  CALCIUM 9.0   PT/INR No results found for this basename: LABPROT, INR,  in the last 72 hours CMP     Component Value Date/Time   NA 134* 10/03/2012 0535   NA 140 08/22/2012 1152   K 3.9 10/03/2012 0535   K 3.8 08/22/2012 1152   CL 97 10/03/2012 0535   CL 104 07/26/2012 0744   CO2 30 10/03/2012 0535   CO2 29 08/22/2012 1152   GLUCOSE 132* 10/03/2012 0535   GLUCOSE 113 08/22/2012 1152   GLUCOSE 100* 07/26/2012 0744   BUN 7 10/03/2012 0535   BUN 15.3 08/22/2012 1152   CREATININE 0.98 10/03/2012 0535   CREATININE 1.1 08/22/2012 1152   CALCIUM 9.0 10/03/2012 0535   CALCIUM 9.4 08/22/2012 1152   PROT 7.7 10/03/2012 0535   PROT 8.2 08/22/2012 1152   ALBUMIN 3.0* 10/03/2012 0535   ALBUMIN 3.2* 08/22/2012 1152   AST 13 10/03/2012 0535   AST 16 08/22/2012 1152   ALT 8 10/03/2012 0535   ALT 12 08/22/2012 1152   ALKPHOS 52 10/03/2012 0535   ALKPHOS 62 08/22/2012 1152   BILITOT 0.4 10/03/2012 0535   BILITOT 0.60 08/22/2012 1152   GFRNONAA >90 10/03/2012 0535   GFRAA >90 10/03/2012 0535   Lipase      Component Value Date/Time   LIPASE 38 11/07/2011 1700       Studies/Results: Dg Abd 2 Views  21-Oct-2012   *RADIOLOGY REPORT*  Clinical Data: Abdominal pain.  Evaluate bowel dilatation.  ABDOMEN - 2 VIEW  Comparison: 09/30/2012  Findings: The enteric tube is looped within the proximal stomach. Multiple abnormally dilated loops of small bowel are again identified.  Massively distended loop of small bowel within the central abdomen is not significantly changed compared with study from 09/29/2012.  On the upright images there is a fluid level within this dilated loop of bowel. Enteric contrast material within the proximal right colon is again noted and appears unchanged in location from previous exam.  No free intraperitoneal air identified.  IMPRESSION:  1. No significant change in high-grade small bowel obstruction.   Original Report Authenticated By: Signa Kell, M.D.    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 Kalman Drape recommendations -May require surgery tomorrow or Thursday (Ex lap, possible resection vs just G-tube)     LOS: 5 days    DORT, Millee Denise 10/03/2012, 7:55 AM Pager: [phone]

## 2012-10-03 NOTE — Progress Notes (Signed)
PARENTERAL NUTRITION CONSULT NOTE - Follow Up  Pharmacy Consult for TNA Indication: Bowel Obstruction  No Known Allergies  Patient Measurements: Height: 5\' 4"  (162.6 cm) Weight: 121 lb 9.6 oz (55.157 kg) [name] : 59.2   Vital Signs: Temp: 98.5 F (36.9 C) (08/19 0531) Temp src: Oral (08/19 0531) BP: 142/97 mmHg (08/19 0531) Pulse Rate: 80 (08/19 0531) Intake/Output from previous day: 08/18 0701 - 08/19 0700 In: 2399.6 [I.V.:2399.6] Out: 3300 [Urine:2300; Emesis/NG output:1000] Intake/Output from this shift:    Labs:  Recent Labs  10/03/12 0535  WBC 6.8  HGB 10.9*  HCT 33.0*  PLT 340     Recent Labs  10/03/12 0535  NA 134*  K 3.9  CL 97  CO2 30  GLUCOSE 132*  BUN 7  CREATININE 0.98  CALCIUM 9.0  MG 1.8  PHOS 3.6  PROT 7.7  ALBUMIN 3.0*  AST 13  ALT 8  ALKPHOS 52  BILITOT 0.4  TRIG 73   Estimated Creatinine Clearance: 68.1 ml/min (by C-G formula based on Cr of 0.98).    Recent Labs  10/02/12 1944 10/03/12 0002 10/03/12 0356  GLUCAP 131* 123* 120*    Medical History: Past Medical History  Diagnosis Date  . Hypertension     no meds  . Cancer     rectal   . 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  10/12/2011    Insulin Requirements in the past 24 hours:  3 units SSI  Current Nutrition:  NPO since 8/14 Clinimix E 5/15 at 40 ml/hr 20% fat emulsion at 10 ml/hr  Assessment: 68 yoM with hx stage III rectal cancer with metastasis to lung, peritoneum, right gluteal soft tissues s/p multiple bowel surgeries including loop ileostomy, small bowel resection, hernia repair, diverting loop colostomy 8/'13, and SBO 8/'13, 9/'13.  Presented to [facility] 8/14 with abdominal pain, nausea, and recurrent SBO confirmed by CT with obstruction likely realted to carcinomatosis.  NGT placed 8/14. No output from ostomy.  Per surgery notes  8/18, surgery planned 8/19.  TNA ordered to get through operative time.   Lines:    Right chest port-a-cath IV team placed peripheral access 8/18 for fluids and IV medications.    Labs: Lytes:  Na 134, other lytes WNL Renal: SCr 0.98, CrCl ~68 ml/min LFTs: WNL Triglycerides: 73 (8/19) Pre-albumin: pending CBGs: controlled, no h/o DM  Day #2 TNA.  Patient tolerating well so will advance rate today.  NG output recorded as 1000 ml yesterday.  Per surgery, may require surgery tomorrow or Thursday for ex lap, possible resection vs just G-tube.  Nutritional Goals:  1650 kCal, 85 grams of protein, 1650 ml fluid per day Clinimix E5/15 at 70 ml/hr with lipids at 21ml/hr will provide 1672 kcal and 84 g protein/day  Plan:  At 1800 today:  Increase Clinimix E5/15 to 47ml/hr.  Fat emulsion at 23ml/hr.   TNA to contain standard multivitamins and trace elements  Reduce IVF to 65 ml/hr  Continue sensitive SSI q 4 h  TNA lab panels on Mondays & Thursdays.  F/u daily.  BMET in AM.  F/u RD consult for caloric and protein needs  Clance Boll, PharmD, BCPS Pager: [phone] 10/03/2012 8:24 AM

## 2012-10-03 NOTE — Progress Notes (Signed)
INITIAL NUTRITION ASSESSMENT  Pt meets criteria for severe MALNUTRITION in the context of chronic illness as evidenced by <50-75% estimated energy intake with 5.5% weight loss in the past month.  DOCUMENTATION CODES Per approved criteria  -Severe malnutrition in the context of chronic illness   INTERVENTION: - TPN per pharmacy - Diet advancement per MD - Will continue to monitor   NUTRITION DIAGNOSIS: Altered GI function related to small bowel obstruction as evidenced by MD notes.    Goal: TPN to meet >90% of estimated nutritional needs.   Monitor:  Weights, labs, NGT output, TPN, diet advancement   Reason for Assessment: Consult for TPN  52 y.o. male  Admitting Dx: Abdominal pain, nausea  ASSESSMENT: TPN: Clinimix E 5/15 @ 40 ml/hr and lipids @ 10 ml/hr. Provides 1162 kcal, and 48 grams protein per day. Meets 70% minimum estimated energy needs and 60% minimum estimated protein needs.  Goal rate: Clinimix E5/15 at 70 ml/hr with lipids at 74ml/hr will provide 1672 kcal and 84 g protein/day which will meet 101% estimated energy needs and 105% estimated protein needs.  - Sodium slightly low - CBGs < 150 mg/dL - PALB low  Pt with history of stage III rectal cancer s/p pelvic radiation, laparoscopic lower anterior resection with a diverting loop ileostomy 12/2009, metastatic lung cancer diagnosed in 02/2011, strangulated peristomal hernia and small bowel obstruction s/p reduction of hernia, omentectomy, exploratory laparotomy, small bowel resection in 01/2010, left adrenal nodule, failure to thrive, HTN, diverting loop colostomy 09/2011. Pt seen by [facility] RD for brief informal visit 09/12/12.   Pt admitted with abdominal pain and nausea. Met with pt who reports completing radiation to right lower lobe and splenic tumor 09/20/12 and has had poor appetite since then with intake of only 50% of usual amount. Reports 10 pound unintended weight loss in the past month. Pt  reports he went down to 95 pounds last year when he was in the ICU and has had a hard time try to regain weight. Pt tries to eat 2 meals/day and drink 3 Ensure Plus/day. Pt denies any improvement in abdominal distention. Pt found to have recurrent small bowel obstruction likely secondary to metastatic CA. Pt with NGT in place with 1L bilious output in the past 24 hours. Pt getting TPN.   Nutrition Focused Physical Exam:  Subcutaneous Fat:  Orbital Region: mild/moderate wasting Upper Arm Region: mild/moderate wasting Thoracic and Lumbar Region: NA  Muscle:  Temple Region: mild/moderate wasting Clavicle Bone Region: mild/moderate wasting Clavicle and Acromion Bone Region: mild/moderate wasting Scapular Bone Region: NA Dorsal Hand: mild/moderate wasting Patellar Region: mild/moderate wasting Anterior Thigh Region: mild/moderate wasting Posterior Calf Region: mild/moderate wasting  Edema: None noted      Height: Ht Readings from Last 1 Encounters:  09/28/12 5\' 4"  (1.626 m)    Weight: Wt Readings from Last 1 Encounters:  09/28/12 121 lb 9.6 oz (55.157 kg)    Ideal Body Weight: 120 lb  % Ideal Body Weight: 101%  Wt Readings from Last 10 Encounters:  09/28/12 121 lb 9.6 oz (55.157 kg)  09/20/12 121 lb 9.6 oz (55.157 kg)  09/11/12 126 lb 8 oz (57.38 kg)  09/05/12 126 lb 8 oz (57.38 kg)  09/04/12 128 lb 12.8 oz (58.423 kg)  08/23/12 127 lb 12.8 oz (57.97 kg)  08/22/12 128 lb 14.4 oz (58.469 kg)  08/10/12 129 lb 12.8 oz (58.877 kg)  08/09/12 129 lb 4 oz (58.627 kg)  07/19/12 129 lb 1.6 oz (  58.559 kg)    Usual Body Weight: 131 lb per pt  % Usual Body Weight: 92%  BMI:  Body mass index is 20.86 kg/(m^2).  Estimated Nutritional Needs: Kcal: 1650-1850 Protein: 80-95g Fluid: 1.6-1.8L/day  Skin: Intact  Diet Order: NPO  EDUCATION NEEDS: -No education needs identified at this time   Intake/Output Summary (Last 24 hours) at 10/03/12 1233 Last data filed at 10/03/12  1000  Gross per 24 hour  Intake 2359.58 ml  Output   3750 ml  Net -1390.42 ml    Last BM: 8/12  Labs:   Recent Labs Lab 09/28/12 0220 09/29/12 0700 10/03/12 0535  NA 133* 135 134*  K 3.7 4.3 3.9  CL 96 103 97  CO2 29 29 30   BUN 17 14 7   CREATININE 0.82 0.89 0.98  CALCIUM 9.6 8.6 9.0  MG  --   --  1.8  PHOS  --   --  3.6  GLUCOSE 108* 138* 132*    CBG (last 3)   Recent Labs  10/03/12 0356 10/03/12 0740 10/03/12 1134  GLUCAP 120* 125* 120*    Scheduled Meds: . enoxaparin (LOVENOX) injection  40 mg Subcutaneous Q24H  . insulin aspart  0-9 Units Subcutaneous Q4H  . metoprolol  5 mg Intravenous Q6H    Continuous Infusions: . Marland KitchenTPN (CLINIMIX-E) Adult 40 mL/hr at 10/02/12 1829   And  . fat emulsion 250 mL (10/02/12 1829)  . Marland KitchenTPN (CLINIMIX-E) Adult     And  . fat emulsion    . dextrose 5 % and 0.9 % NaCl with KCl 20 mEq/L 85 mL/hr at 10/03/12 0600  . dextrose 5 % and 0.9 % NaCl with KCl 20 mEq/L      Past Medical History  Diagnosis Date  . Hypertension     no meds  . Cancer     rectal   . 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  10/12/2011    Past Surgical History  Procedure Laterality Date  . Multiple tooth extractions      9/11  . Portacath placement  04/12/2011    Procedure: INSERTION PORT-A-CATH;  Surgeon: Almond Lint, MD;  Location: [facility];  Service: General;  Laterality: N/A;  port a cath insertion  . Colon surgery  12/11    sbo  . Colon surgery  8/12    colon resection-iliostomy  . Colon surgery  2012    iliostomy takedown  . Colostomy  10/05/2011    Procedure: COLOSTOMY;  Surgeon: Almond Lint, MD;  Location: [facility] OR;  Service: General;  Laterality: N/A;  open colostomy  . Examination under anesthesia  10/05/2011    Procedure: EXAM UNDER ANESTHESIA;  Surgeon: Almond Lint, MD;  Location: [facility] OR;  Service: General;   Laterality: N/A;  exam under anesthesia  . Proctoscopy  10/05/2011    Procedure: PROCTOSCOPY;  Surgeon: Almond Lint, MD;  Location: [facility] OR;  Service: General;  Laterality: N/A;  rigid proctoscopy  . Laparoscopy  10/05/2011    Procedure: LAPAROSCOPY DIAGNOSTIC;  Surgeon: Almond Lint, MD;  Location: [facility] OR;  Service: General;  Laterality: N/A;  . Laparotomy  11/10/2011    Procedure: EXPLORATORY LAPAROTOMY;  Surgeon: Almond Lint, MD;  Location: [facility];  Service: General;  Laterality: N/A;  SMALL BOWEL RESECTION x 2  . Application of wound vac  11/10/2011    Procedure: APPLICATION OF WOUND  VAC;  Surgeon: Almond Lint, MD;  Location: [facility];  Service: General;;     Levon Hedger MS, RD, LDN [phone] Pager [phone] After Hours Pager

## 2012-10-03 NOTE — Progress Notes (Signed)
Discussed ex lap for wed.  Not sure much can be done but place GT.  High risk of complications and worsening outcome. Will not prolong life or improve quality of life.  He would like to try something so he can go home.

## 2012-10-04 ENCOUNTER — Inpatient Hospital Stay (HOSPITAL_COMMUNITY): Payer: Medicare Other | Admitting: Anesthesiology

## 2012-10-04 ENCOUNTER — Encounter (HOSPITAL_COMMUNITY): Payer: Self-pay | Admitting: Anesthesiology

## 2012-10-04 ENCOUNTER — Encounter (HOSPITAL_COMMUNITY): Admission: EM | Disposition: A | Discharge status: Home Health | Payer: Self-pay | Source: Home / Self Care

## 2012-10-04 DIAGNOSIS — C19 Malignant neoplasm of rectosigmoid junction: Secondary | ICD-10-CM

## 2012-10-04 DIAGNOSIS — E43 Unspecified severe protein-calorie malnutrition: Secondary | ICD-10-CM | POA: Insufficient documentation

## 2012-10-04 DIAGNOSIS — K56609 Unspecified intestinal obstruction, unspecified as to partial versus complete obstruction: Secondary | ICD-10-CM

## 2012-10-04 HISTORY — PX: LAPAROTOMY: SHX154

## 2012-10-04 LAB — SURGICAL PCR SCREEN
MRSA, PCR: NEGATIVE
Staphylococcus aureus: NEGATIVE

## 2012-10-04 LAB — BASIC METABOLIC PANEL
BUN: 14 mg/dL (ref 6–23)
Calcium: 9.1 mg/dL (ref 8.4–10.5)
GFR calc non Af Amer: 80 mL/min — ABNORMAL LOW (ref >90)
Glucose, Bld: 124 mg/dL — ABNORMAL HIGH (ref 70–99)
Potassium: 4.1 mEq/L (ref 3.5–5.1)

## 2012-10-04 LAB — GLUCOSE, CAPILLARY
Glucose-Capillary: 117 mg/dL — ABNORMAL HIGH (ref 70–99)
Glucose-Capillary: 170 mg/dL — ABNORMAL HIGH (ref 70–99)

## 2012-10-04 SURGERY — LAPAROTOMY, EXPLORATORY
Anesthesia: General | Site: Abdomen | Wound class: Contaminated

## 2012-10-04 MED ORDER — SODIUM CHLORIDE 0.9 % IV SOLN
Freq: Once | INTRAVENOUS | Status: AC
  Administered 2012-10-04: 23:00:00 via INTRAVENOUS

## 2012-10-04 MED ORDER — FENTANYL CITRATE 0.05 MG/ML IJ SOLN
25.0000 ug | INTRAMUSCULAR | Status: DC | PRN
  Administered 2012-10-04 (×2): 50 ug via INTRAVENOUS

## 2012-10-04 MED ORDER — TRACE MINERALS CR-CU-F-FE-I-MN-MO-SE-ZN IV SOLN
INTRAVENOUS | Status: AC
  Administered 2012-10-04: 17:00:00 via INTRAVENOUS
  Filled 2012-10-04: qty 2000

## 2012-10-04 MED ORDER — ROCURONIUM BROMIDE 100 MG/10ML IV SOLN
INTRAVENOUS | Status: DC | PRN
  Administered 2012-10-04: 20 mg via INTRAVENOUS
  Administered 2012-10-04: 10 mg via INTRAVENOUS

## 2012-10-04 MED ORDER — LACTATED RINGERS IV SOLN: INTRAVENOUS | Status: DC

## 2012-10-04 MED ORDER — SODIUM CHLORIDE 0.9 % IJ SOLN: 9.0000 mL | INTRAMUSCULAR | Status: DC | PRN

## 2012-10-04 MED ORDER — ONDANSETRON HCL 4 MG/2ML IJ SOLN
4.0000 mg | Freq: Four times a day (QID) | INTRAMUSCULAR | Status: DC | PRN
Start: 2012-10-04 — End: 2012-10-07

## 2012-10-04 MED ORDER — FENTANYL CITRATE 0.05 MG/ML IJ SOLN
INTRAMUSCULAR | Status: DC | PRN
  Administered 2012-10-04: 100 ug via INTRAVENOUS
  Administered 2012-10-04 (×4): 50 ug via INTRAVENOUS

## 2012-10-04 MED ORDER — PROPOFOL 10 MG/ML IV BOLUS
INTRAVENOUS | Status: DC | PRN
  Administered 2012-10-04: 150 mg via INTRAVENOUS

## 2012-10-04 MED ORDER — LABETALOL HCL 5 MG/ML IV SOLN
10.0000 mg | INTRAVENOUS | Status: AC
  Administered 2012-10-04 (×2): 10 mg via INTRAVENOUS

## 2012-10-04 MED ORDER — ONDANSETRON HCL 4 MG/2ML IJ SOLN
INTRAMUSCULAR | Status: DC | PRN
  Administered 2012-10-04: 4 mg via INTRAVENOUS

## 2012-10-04 MED ORDER — HYDROMORPHONE HCL PF 1 MG/ML IJ SOLN: 0.2500 mg | INTRAMUSCULAR | Status: DC | PRN

## 2012-10-04 MED ORDER — DEXTROSE 5 % IV SOLN
3.0000 g | INTRAVENOUS | Status: DC
  Filled 2012-10-04: qty 3000

## 2012-10-04 MED ORDER — DIPHENHYDRAMINE HCL 12.5 MG/5ML PO ELIX: 12.5000 mg | ORAL_SOLUTION | Freq: Four times a day (QID) | ORAL | Status: DC | PRN

## 2012-10-04 MED ORDER — MEPERIDINE HCL 50 MG/ML IJ SOLN: 6.2500 mg | INTRAMUSCULAR | Status: DC | PRN

## 2012-10-04 MED ORDER — PROMETHAZINE HCL 25 MG/ML IJ SOLN: 6.2500 mg | INTRAMUSCULAR | Status: DC | PRN

## 2012-10-04 MED ORDER — NALOXONE HCL 0.4 MG/ML IJ SOLN: 0.4000 mg | INTRAMUSCULAR | Status: DC | PRN

## 2012-10-04 MED ORDER — LABETALOL HCL 5 MG/ML IV SOLN
5.0000 mg | INTRAVENOUS | Status: AC
  Administered 2012-10-04 (×4): 5 mg via INTRAVENOUS

## 2012-10-04 MED ORDER — CEFAZOLIN SODIUM-DEXTROSE 2-3 GM-% IV SOLR
INTRAVENOUS | Status: DC | PRN
  Administered 2012-10-04: 2 g via INTRAVENOUS

## 2012-10-04 MED ORDER — GLYCOPYRROLATE 0.2 MG/ML IJ SOLN
INTRAMUSCULAR | Status: DC | PRN
  Administered 2012-10-04: 0.4 mg via INTRAVENOUS

## 2012-10-04 MED ORDER — 0.9 % SODIUM CHLORIDE (POUR BTL) OPTIME
TOPICAL | Status: DC | PRN
  Administered 2012-10-04: 2000 mL

## 2012-10-04 MED ORDER — FAT EMULSION 20 % IV EMUL
240.0000 mL | INTRAVENOUS | Status: AC
Start: 2012-10-04 — End: 2012-10-05
  Administered 2012-10-04: 240 mL via INTRAVENOUS
  Filled 2012-10-04: qty 250

## 2012-10-04 MED ORDER — DEXTROSE 10 % IV SOLN
INTRAVENOUS | Status: AC
  Administered 2012-10-04: 10:00:00 via INTRAVENOUS
  Filled 2012-10-04 (×2): qty 1000

## 2012-10-04 MED ORDER — DIPHENHYDRAMINE HCL 50 MG/ML IJ SOLN: 12.5000 mg | Freq: Four times a day (QID) | INTRAMUSCULAR | Status: DC | PRN

## 2012-10-04 MED ORDER — INSULIN ASPART 100 UNIT/ML ~~LOC~~ SOLN
0.0000 [IU] | Freq: Four times a day (QID) | SUBCUTANEOUS | Status: DC
  Administered 2012-10-04 – 2012-10-05 (×2): 2 [IU] via SUBCUTANEOUS
  Administered 2012-10-05 – 2012-10-06 (×5): 1 [IU] via SUBCUTANEOUS

## 2012-10-04 MED ORDER — MIDAZOLAM HCL 5 MG/5ML IJ SOLN
INTRAMUSCULAR | Status: DC | PRN
  Administered 2012-10-04: 2 mg via INTRAVENOUS

## 2012-10-04 MED ORDER — NEOSTIGMINE METHYLSULFATE 1 MG/ML IJ SOLN
INTRAMUSCULAR | Status: DC | PRN
  Administered 2012-10-04: 3 mg via INTRAVENOUS

## 2012-10-04 MED ORDER — HYDROMORPHONE HCL PF 1 MG/ML IJ SOLN
0.2500 mg | INTRAMUSCULAR | Status: DC | PRN
  Administered 2012-10-04 (×6): 0.5 mg via INTRAVENOUS

## 2012-10-04 MED ORDER — LIDOCAINE HCL (CARDIAC) 20 MG/ML IV SOLN
INTRAVENOUS | Status: DC | PRN
  Administered 2012-10-04: 100 mg via INTRAVENOUS

## 2012-10-04 MED ORDER — SUCCINYLCHOLINE CHLORIDE 20 MG/ML IJ SOLN
INTRAMUSCULAR | Status: DC | PRN
  Administered 2012-10-04: 100 mg via INTRAVENOUS

## 2012-10-04 MED ORDER — HYDROMORPHONE 0.3 MG/ML IV SOLN
INTRAVENOUS | Status: DC
  Administered 2012-10-04: 1.1 mg via INTRAVENOUS
  Administered 2012-10-04: 15:00:00 via INTRAVENOUS
  Administered 2012-10-04: 5.4 mg via INTRAVENOUS
  Administered 2012-10-05: 7.66 mg via INTRAVENOUS
  Administered 2012-10-05: 2.4 mg via INTRAVENOUS
  Administered 2012-10-05: 4.8 mg via INTRAVENOUS
  Administered 2012-10-05 (×2): via INTRAVENOUS
  Administered 2012-10-05: 7.2 mg via INTRAVENOUS
  Administered 2012-10-06: 2.4 mg via INTRAVENOUS
  Administered 2012-10-06: 1.2 mg via INTRAVENOUS
  Administered 2012-10-06: 3.9 mg via INTRAVENOUS
  Administered 2012-10-06: 1.5 mg via INTRAVENOUS
  Administered 2012-10-06: 1.65 mg via INTRAVENOUS
  Administered 2012-10-06: 22:00:00 via INTRAVENOUS
  Administered 2012-10-06: 3.6 mg via INTRAVENOUS
  Administered 2012-10-07: 9.6 mg via INTRAVENOUS
  Administered 2012-10-07: 4.2 mg via INTRAVENOUS
  Filled 2012-10-04 (×8): qty 25

## 2012-10-04 MED ORDER — KCL IN DEXTROSE-NACL 20-5-0.9 MEQ/L-%-% IV SOLN
INTRAVENOUS | Status: DC
  Administered 2012-10-01 – 2012-10-06 (×3): via INTRAVENOUS
  Filled 2012-10-04 (×5): qty 1000

## 2012-10-04 MED ORDER — LACTATED RINGERS IV SOLN
INTRAVENOUS | Status: DC
  Administered 2012-10-04: 1000 mL via INTRAVENOUS
  Administered 2012-10-04 (×2): via INTRAVENOUS

## 2012-10-04 MED ORDER — LABETALOL HCL 5 MG/ML IV SOLN
INTRAVENOUS | Status: DC | PRN
  Administered 2012-10-04: 5 mg via INTRAVENOUS

## 2012-10-04 SURGICAL SUPPLY — 43 items
APPLICATOR COTTON TIP 6IN STRL (MISCELLANEOUS) IMPLANT
BLADE EXTENDED COATED 6.5IN (ELECTRODE) ×2 IMPLANT
BLADE HEX COATED 2.75 (ELECTRODE) ×2 IMPLANT
CANISTER SUCTION 2500CC (MISCELLANEOUS) ×2 IMPLANT
CATH FOLEY 2WAY SLVR 30CC 24FR (CATHETERS) ×2 IMPLANT
CLAMP POUCH DRAINAGE QUIET (OSTOMY) ×2 IMPLANT
CLOTH BEACON ORANGE TIMEOUT ST (SAFETY) ×2 IMPLANT
COVER MAYO STAND STRL (DRAPES) ×2 IMPLANT
DRAPE LAPAROSCOPIC ABDOMINAL (DRAPES) ×2 IMPLANT
DRAPE WARM FLUID 44X44 (DRAPE) ×2 IMPLANT
DRSG OPSITE POSTOP 4X12 (GAUZE/BANDAGES/DRESSINGS) ×2 IMPLANT
ELECT REM PT RETURN 9FT ADLT (ELECTROSURGICAL) ×2
ELECTRODE REM PT RTRN 9FT ADLT (ELECTROSURGICAL) ×1 IMPLANT
GLOVE BIOGEL PI IND STRL 7.0 (GLOVE) ×1 IMPLANT
GLOVE BIOGEL PI INDICATOR 7.0 (GLOVE) ×1
GLOVE INDICATOR 8.0 STRL GRN (GLOVE) ×4 IMPLANT
GLOVE SS BIOGEL STRL SZ 8 (GLOVE) ×1 IMPLANT
GLOVE SUPERSENSE BIOGEL SZ 8 (GLOVE) ×1
GOWN STRL NON-REIN LRG LVL3 (GOWN DISPOSABLE) ×2 IMPLANT
GOWN STRL REIN XL XLG (GOWN DISPOSABLE) ×8 IMPLANT
KIT BASIN OR (CUSTOM PROCEDURE TRAY) ×2 IMPLANT
NS IRRIG 1000ML POUR BTL (IV SOLUTION) ×2 IMPLANT
PACK GENERAL/GYN (CUSTOM PROCEDURE TRAY) ×2 IMPLANT
POUCH OSTOMY 1 3/4  H (OSTOMY) ×2 IMPLANT
SPONGE GAUZE 4X4 12PLY (GAUZE/BANDAGES/DRESSINGS) ×2 IMPLANT
SPONGE LAP 18X18 X RAY DECT (DISPOSABLE) IMPLANT
STAPLER GUN LINEAR PROX 60 (STAPLE) ×2 IMPLANT
STAPLER PROXIMATE 75MM BLUE (STAPLE) ×2 IMPLANT
STAPLER VISISTAT 35W (STAPLE) ×2 IMPLANT
SUCTION POOLE TIP (SUCTIONS) ×2 IMPLANT
SUT PDS AB 1 CTX 36 (SUTURE) ×4 IMPLANT
SUT SILK 2 0 (SUTURE) ×1
SUT SILK 2 0 SH CR/8 (SUTURE) ×6 IMPLANT
SUT SILK 2-0 18XBRD TIE 12 (SUTURE) ×1 IMPLANT
SUT SILK 3 0 (SUTURE) ×1
SUT SILK 3 0 SH CR/8 (SUTURE) ×2 IMPLANT
SUT SILK 3-0 18XBRD TIE 12 (SUTURE) ×1 IMPLANT
SUT VIC AB 2-0 SH 18 (SUTURE) IMPLANT
SUT VIC AB 3-0 SH 18 (SUTURE) IMPLANT
SYR 20CC LL (SYRINGE) ×2 IMPLANT
TOWEL OR 17X26 10 PK STRL BLUE (TOWEL DISPOSABLE) ×4 IMPLANT
TRAY FOLEY CATH 14FRSI W/METER (CATHETERS) ×2 IMPLANT
YANKAUER SUCT BULB TIP NO VENT (SUCTIONS) ×2 IMPLANT

## 2012-10-04 NOTE — Interval H&P Note (Signed)
History and Physical Interval Note:  10/04/2012 10:06 AM  Jeffery [name]  has presented today for surgery, with the diagnosis of bowel obstruction  The various methods of treatment have been discussed with the patient and family. After consideration of risks, benefits and other options for treatment, the patient has consented to  Procedure(s): EXPLORATORY LAPAROTOMY (N/A) as a surgical intervention .  The patient's history has been reviewed, patient examined, no change in status, stable for surgery.  I have reviewed the patient's chart and labs.  Questions were answered to the patient's satisfaction.     Janara Klett A.

## 2012-10-04 NOTE — Progress Notes (Signed)
PARENTERAL NUTRITION CONSULT NOTE - Follow Up  Pharmacy Consult for TNA Indication: Bowel Obstruction  No Known Allergies  Patient Measurements: Height: 5\' 4"  (162.6 cm) Weight: 121 lb 9.6 oz (55.157 kg) [name] : 59.2   Vital Signs: Temp: 98.2 F (36.8 C) (08/20 0539) Temp src: Oral (08/20 0539) BP: 153/99 mmHg (08/20 0539) Pulse Rate: 86 (08/20 0539) Intake/Output from previous day: 08/19 0701 - 08/20 0700 In: 4124.7 [I.V.:1793.3; NG/GT:570; TPN:1761.3] Out: 2020 [Urine:1050; Emesis/NG output:800; Stool:170] Intake/Output from this shift: Total I/O In: -  Out: 300 [Emesis/NG output:300]  Labs:  Recent Labs  10/03/12 0535  WBC 6.8  HGB 10.9*  HCT 33.0*  PLT 340     Recent Labs  10/03/12 0535 10/04/12 0550  NA 134* 133*  K 3.9 4.1  CL 97 97  CO2 30 30  GLUCOSE 132* 124*  BUN 7 14  CREATININE 0.98 1.04  CALCIUM 9.0 9.1  MG 1.8  --   PHOS 3.6  --   PROT 7.7  --   ALBUMIN 3.0*  --   AST 13  --   ALT 8  --   ALKPHOS 52  --   BILITOT 0.4  --   PREALBUMIN 8.7*  --   TRIG 73  --    Estimated Creatinine Clearance: 64.1 ml/min (by C-G formula based on Cr of 1.04).    Recent Labs  10/03/12 1940 10/03/12 2324 10/04/12 0343  GLUCAP 120* 124* 117*    Medical History: Past Medical History  Diagnosis Date  . Hypertension     no meds  . Cancer     rectal   . 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  10/12/2011    Insulin Requirements in the past 24 hours:  3 units SSI  Current Nutrition:  NPO since 8/14 Clinimix E 5/15 at 60 ml/hr 20% fat emulsion at 10 ml/hr  Assessment: 2 yoM with hx stage III rectal cancer with metastasis to lung, peritoneum, right gluteal soft tissues s/p multiple bowel surgeries including loop ileostomy, small bowel resection, hernia repair, diverting loop colostomy 8/'13, and SBO 8/'13, 9/'13.   Presented to [facility] 8/14 with abdominal pain, nausea, and recurrent SBO confirmed by CT with obstruction likely realted to carcinomatosis.  NGT placed 8/14. No output from ostomy.  Per surgery notes 8/18, surgery planned 8/19.  TNA ordered to get through operative time.   Lines:    Right chest port-a-cath IV team placed peripheral access 8/18 for fluids and IV medications.    Labs: Lytes:  Na 133, other lytes WNL Renal: SCr 1.04, CrCl ~64 ml/min LFTs: WNL (8/19) Triglycerides: 73 (8/19) Pre-albumin: 8.7 (8/19) CBGs: controlled, no h/o DM.  SSI q4h - will reduce to q6h.  Day #3 TNA.  Patient tolerating well so will advance to goal rate today.  NG output recorded as 800 ml yesterday.  Per surgery, planning for ex lap today, possible resection vs just G-tube.  Nutritional Goals:  Per RD recommendations (8/19): 1650-1850 kCal, 80-95 grams of protein, 1.6-1.8L fluid per day Clinimix E5/15 at 70 ml/hr with lipids at 30ml/hr will provide 1672 kcal and 84 g protein/day  Plan:  At 1800 today:  Increase Clinimix E5/15 to goal rate at 66ml/hr.  Fat emulsion at 84ml/hr.   TNA to contain standard multivitamins and trace elements  Reduce IVF to [facility].  Adjust sensitive SSI to q6h  TNA lab panels on Mondays & Thursdays.  F/u daily.   Clance Boll, PharmD, BCPS Pager: [phone] 10/04/2012 7:27 AM

## 2012-10-04 NOTE — Transfer of Care (Signed)
Immediate Anesthesia Transfer of Care Note  Patient: Jeffery [name]  Procedure(s) Performed: Procedure(s) with comments: EXPLORATORY LAPAROTOMY,  (N/A) - GTUBE PLACEMENT  Patient Location: PACU  Anesthesia Type:General  Level of Consciousness: sedated  Airway & Oxygen Therapy: Patient Spontanous Breathing and Patient connected to face mask oxygen  Post-op Assessment: Report given to PACU RN and Post -op Vital signs reviewed and stable  Post vital signs: Reviewed and stable  Complications: No apparent anesthesia complications

## 2012-10-04 NOTE — Progress Notes (Signed)
Call received from patient's bedside nurse that patient disconnected TNA/Lipids from infusing through the port a cath in order to change his clothes. According to the nurse per the patient he reconnected his TNA to his peripheral IV. When the nurse saw this she reconnected the TNA to the port a cath. Informed nurse to notify MD of what happened. I placed a call to pharmacy to let them know what happened. TNA taken down, port a cath reaccessed and D10 hung at 78ml/hour per pharmacy. Patient teaching done on the importance of calling for assistance when needing disconnection and patient teaching done about TNA. I also called Darlene,RN in the holding area to make her aware of D10 infusing because the patient is on his way down to surgery at this time.

## 2012-10-04 NOTE — Anesthesia Preprocedure Evaluation (Signed)
Anesthesia Evaluation  Patient identified by MRN, date of birth, ID band Patient awake    Reviewed: Allergy & Precautions, H&P , NPO status , Patient's Chart, lab work & pertinent test results, reviewed documented beta blocker date and time   Airway Mallampati: I TM Distance: >3 FB Neck ROM: full    Dental  (+) Dental Advidsory Given   Pulmonary Current Smoker,  pulm nodules breath sounds clear to auscultation  Pulmonary exam normal       Cardiovascular hypertension, Pt. on medications Rhythm:regular Rate:Normal     Neuro/Psych negative neurological ROS  negative psych ROS   GI/Hepatic Neg liver ROS, Colon cancer   Endo/Other  negative endocrine ROS  Renal/GU negative Renal ROS  negative genitourinary   Musculoskeletal negative musculoskeletal ROS (+)   Abdominal   Peds negative pediatric ROS (+)  Hematology   Anesthesia Other Findings   Reproductive/Obstetrics negative OB ROS                           Anesthesia Physical  Anesthesia Plan  ASA: III  Anesthesia Plan: General   Post-op Pain Management:    Induction: Intravenous  Airway Management Planned: Oral ETT  Additional Equipment:   Intra-op Plan:   Post-operative Plan: Extubation in OR  Informed Consent: I have reviewed the patients History and Physical, chart, labs and discussed the procedure including the risks, benefits and alternatives for the proposed anesthesia with the patient or authorized representative who has indicated his/her understanding and acceptance.   Dental Advisory Given and Dental advisory given  Plan Discussed with: CRNA, Anesthesiologist and Surgeon  Anesthesia Plan Comments:         Anesthesia Quick Evaluation

## 2012-10-04 NOTE — Progress Notes (Signed)
Dr Biagio Quint notified patient has continued heart rate of 130-135.  Heart rate unchanged with pain medications. Urinary output of 100 cc from foley catheter since 1800.  Current vitals stated for Dr Biagio Quint.  Orders received for Iv Normal saline bolus.

## 2012-10-04 NOTE — Brief Op Note (Signed)
09/28/2012 - 10/04/2012  11:59 AM  PATIENT:  Jeffery [name]  52 y.o. male  PRE-OPERATIVE DIAGNOSIS:  bowel obstruction  POST-OPERATIVE DIAGNOSIS:  bowel obstruction   PROCEDURE:  Procedure(s) with comments: EXPLORATORY LAPAROTOMY,  (N/A) - GTUBE PLACEMENT  SURGEON:  Surgeon(s) and Role:    * Krayton Wortley A. Elmor Kost, MD - Primary     ANESTHESIA:   general  EBL:  Total I/O In: 1000 [I.V.:1000] Out: 550 [Urine:200; Emesis/NG output:300; Blood:50]  BLOOD ADMINISTERED:none  DRAINS: none   LOCAL MEDICATIONS USED:  NONE  SPECIMEN:  No Specimen  DISPOSITION OF SPECIMEN:  PATHOLOGY  COUNTS:  YES  TOURNIQUET:  * No tourniquets in log *  DICTATION: .Other Dictation: Dictation Number [id number]  PLAN OF CARE: Admit to inpatient   PATIENT DISPOSITION:  PACU - hemodynamically stable.   Delay start of Pharmacological VTE agent (>24hrs) due to surgical blood loss or risk of bleeding: no

## 2012-10-04 NOTE — Op Note (Signed)
NAMEMarland Kitchen  Jeffery [name], Jeffery [name] NO.:  [id number]  MEDICAL RECORD NO.:  [id number]  LOCATION:  1534                         FACILITY:  [facility]  PHYSICIAN:  Maisie Fus A. 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  POSTOPERATIVE DIAGNOSIS:  Closed loop obstruction of distal ileum secondary to frozen pelvis, secondary to carcinomatosis, verified by frozen section.  PROCEDURES: 1. Exploratory laparotomy and intestinal bypass of closed loop     obstruction to the transverse colon. 2. Placement of a 24-French Stamm gastrostomy.  SURGEON:  Dortha Schwalbe, MD.  ASSISTANT:  Staff.  MATED BLOOD LOSS:  Less than 10 mL.  SPECIMEN:  Peritoneal nodule to pathology.  DRAINS:  None.  IV FLUIDS:  Proximally a liter of crystalloid.  INDICATIONS FOR PROCEDURE:  The patient is a 52 year old male with metastatic rectal cancer, in which he underwent low anterior resection of the bowel and has been on chemotherapy, but his disease has progressed on chemotherapy.  He developed a large bowel or small bowel obstruction and carcinomatosis due to a pelvic mass.  Discussed options of palliative care hospice care or an attempt to resolve his obstructive symptoms.  I have discussed that I am not sure what could be done.  This would be high risk and he would be at risk for high complications given his metastatic disease and poor nutritional status.  We discussed all this at great length and I discussed this with Dr. Fredric Mare and oncologist.  At this point in time, he wished to proceed with surgery to palliate his care understanding should not improve his survival could hasten his demise in death and he would have multiple complications and his quite a live to be significantly impacted by doing this.   He understood, but really want to go home.  This is the best chance to do that.  DESCRIPTION OF PROCEDURE:  The patient was met in the holding area. Questions were answered.  He has been taken back to the operating room and placed supine where general anesthesia was initiated.  Time-out was done.  We covered this ostomy in left lower quadrant after prep and drape with occlusive dressing.  After time-out was done, the midline incision was made.  Dissection was carried down the upper abdomen to enter the abdominal cavity.  Upon entering, he had significantly chronically dilated small bowel which looked like ilium.  It was difficult because his pelvis full of tumor, but I was able to get my hand down there and then ran the small bowel from ligament of Treitz to a point where it dove into the pelvis which was encased in tumor not mobile.  The loop came out of the pelvis and was massively chronically dilated again then went back down into the pelvis and tumor had encased this.  This is not something I could resect.  This was what most of dilation was what appeared to be a closed-loop hypo-obstruction.  There was no evidence of ischemia.  This appeared to be  a slow chronic process secondary to carcinomatosis.  Peritoneal stud was removed and sent, and this indeed showed metastatic adenocarcinoma.  His right colon was relatively decompressed.  I could see where the ligament of Treitz, the terminal ileum inserted into the right colon, back into his tumor mass in his pelvis.  I looked at the options and knew he had NG tube.  I felt that if I can decompress this to his transverse colon which was suture close by this at least helps on this pain distention.  We went ahead and created a side-to-side anastomosis.  This closed loop obstruction to his transverse colon using a GIA-75 stapling device and TA-60 stapling device.  We had to over sew the staple line of the common enterotomy with 2-0 silk  suture.  It was widely patent.  This significantly helped his distention and decompressed this loop.  The remainder of small bowel was mildly dilated leading up to the ligament of Treitz.  Then, a 82- [place] Stamm gastrostomy was placed through a separate stab incision, left upper quadrant.  The wound was made.  We then passed a 24-French Foley catheter through this.  Pursestring suture of 2-0 silk was placed in the anterior wall of the mid stomach.  Gastrotomy was performed the catheter was placed into this and balloon was inflated to 10 mL.  We then tied the pursestring suture down.  Stomach secured to the posterior abdominal wall, using 2-0 silk circumferentially.  We then secured the NG tube to the skin with 2-0 nylon.  Irrigation was used and suctioned out.  We then closed the fascia with #1 PDS.  The wound left open and packed.  G-tube placed to gravity.  All final counts of sponge, needle, instruments found to be correct this portion of the case.  The patient was then awoke, extubated, taken to recovery in satisfactory condition.     Jeffery [name], M.D.     TAC/MEDQ  D:  10/04/2012  T:  10/04/2012  Job:  [id number]

## 2012-10-05 ENCOUNTER — Encounter (HOSPITAL_COMMUNITY): Payer: Self-pay | Admitting: Surgery

## 2012-10-05 LAB — COMPREHENSIVE METABOLIC PANEL
ALT: 6 U/L (ref 0–53)
AST: 17 U/L (ref 0–37)
Albumin: 2.5 g/dL — ABNORMAL LOW (ref 3.5–5.2)
Alkaline Phosphatase: 45 U/L (ref 39–117)
CO2: 27 mEq/L (ref 19–32)
Chloride: 96 mEq/L (ref 96–112)
Creatinine, Ser: 0.96 mg/dL (ref 0.50–1.35)
GFR calc non Af Amer: >90 mL/min (ref >90)
Potassium: 4.3 mEq/L (ref 3.5–5.1)
Total Bilirubin: 0.6 mg/dL (ref 0.3–1.2)

## 2012-10-05 LAB — GLUCOSE, CAPILLARY: Glucose-Capillary: 168 mg/dL — ABNORMAL HIGH (ref 70–99)

## 2012-10-05 LAB — PHOSPHORUS: Phosphorus: 3.2 mg/dL (ref 2.3–4.6)

## 2012-10-05 MED ORDER — FAT EMULSION 20 % IV EMUL
240.0000 mL | INTRAVENOUS | Status: AC
  Administered 2012-10-05: 240 mL via INTRAVENOUS
  Filled 2012-10-05: qty 250

## 2012-10-05 MED ORDER — CLINIMIX E/DEXTROSE (5/15) 5 % IV SOLN
INTRAVENOUS | Status: AC
  Administered 2012-10-05: 17:00:00 via INTRAVENOUS
  Filled 2012-10-05: qty 2000

## 2012-10-05 NOTE — Progress Notes (Signed)
Pt appears comfortable.  Palliative care to see.  Will not be able to tolerate any treatment for 6 - 8 weeks.  Unlikely to be alive at that point in time.  Discussed with patient and family at great length.

## 2012-10-05 NOTE — Anesthesia Postprocedure Evaluation (Signed)
  Anesthesia Post-op Note  Patient: Jeffery [name]  Procedure(s) Performed: Procedure(s) (LRB): EXPLORATORY LAPAROTOMY,  (N/A)  Patient Location: PACU  Anesthesia Type: General  Level of Consciousness: awake and alert   Airway and Oxygen Therapy: Patient Spontanous Breathing  Post-op Pain: mild  Post-op Assessment: Post-op Vital signs reviewed, Patient's Cardiovascular Status Stable, Respiratory Function Stable, Patent Airway and No signs of Nausea or vomiting  Last Vitals:  Filed Vitals:   10/05/12 0759  BP:   Pulse:   Temp:   Resp: 18    Post-op Vital Signs: stable   Complications: No apparent anesthesia complications. Post op HTN treated with labetalol

## 2012-10-05 NOTE — Progress Notes (Signed)
1 Day Post-Op  Subjective: Pt is in a lot of pain, well controlled with PCA.  Tolerating some of his clear liquid tray.  Not ambulated much.  Using IS and is at 750.    Objective: Vital signs in last 24 hours: Temp:  [97.5 F (36.4 C)-99.6 F (37.6 C)] 98.6 F (37 C) (08/21 0455) Pulse Rate:  [87-132] 122 (08/21 0553) Resp:  [12-28] 20 (08/21 0553) BP: (127-179)/(88-118) 137/99 mmHg (08/21 0455) SpO2:  [96 %-100 %] 98 % (08/21 0553) Last BM Date: 09/27/12  Intake/Output from previous day: 08/20 0701 - 08/21 0700 In: 5148 [I.V.:4190.7; TPN:957.3] Out: 1350 [Urine:1000; Emesis/NG output:300; Blood:50] Intake/Output this shift:    PE: Gen:  Alert, NAD, pleasant Abd: Soft, mildly distended, moderately tender to palpation, decreased BS, no HSM, incision with sanguinous drainage over honeycomb dressing, Gtube in place, ostomy without flatus or BM yet.   Lab Results:   Recent Labs  10/03/12 0535  WBC 6.8  HGB 10.9*  HCT 33.0*  PLT 340   BMET  Recent Labs  10/04/12 0550 10/05/12 0535  NA 133* 129*  K 4.1 4.3  CL 97 96  CO2 30 27  GLUCOSE 124* 166*  BUN 14 20  CREATININE 1.04 0.96  CALCIUM 9.1 8.2*   PT/INR No results found for this basename: LABPROT, INR,  in the last 72 hours CMP     Component Value Date/Time   NA 129* 10/05/2012 0535   NA 140 08/22/2012 1152   K 4.3 10/05/2012 0535   K 3.8 08/22/2012 1152   CL 96 10/05/2012 0535   CL 104 07/26/2012 0744   CO2 27 10/05/2012 0535   CO2 29 08/22/2012 1152   GLUCOSE 166* 10/05/2012 0535   GLUCOSE 113 08/22/2012 1152   GLUCOSE 100* 07/26/2012 0744   BUN 20 10/05/2012 0535   BUN 15.3 08/22/2012 1152   CREATININE 0.96 10/05/2012 0535   CREATININE 1.1 08/22/2012 1152   CALCIUM 8.2* 10/05/2012 0535   CALCIUM 9.4 08/22/2012 1152   PROT 6.9 10/05/2012 0535   PROT 8.2 08/22/2012 1152   ALBUMIN 2.5* 10/05/2012 0535   ALBUMIN 3.2* 08/22/2012 1152   AST 17 10/05/2012 0535   AST 16 08/22/2012 1152   ALT 6 10/05/2012 0535   ALT 12 08/22/2012  1152   ALKPHOS 45 10/05/2012 0535   ALKPHOS 62 08/22/2012 1152   BILITOT 0.6 10/05/2012 0535   BILITOT 0.60 08/22/2012 1152   GFRNONAA >90 10/05/2012 0535   GFRAA >90 10/05/2012 0535   Lipase     Component Value Date/Time   LIPASE 38 11/07/2011 1700       Studies/Results: No results found.  Anti-infectives: Anti-infectives   Start     Dose/Rate Route Frequency Ordered Stop   10/04/12 0745  ceFAZolin (ANCEF) 3 g in dextrose 5 % 50 mL IVPB  Status:  Discontinued     3 g 160 mL/hr over 30 Minutes Intravenous On call to O.R. 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  IVF, PCA, antiemetics 2.  Clear liquids for palliation, advance when bowel function returns 3.  Re-discussed palliative care consult with patient, he will agree, but he "still wants to fight", his girlfriend is at bedside 4.  Ambulate and IS 5.  SCD's and lovenox 6.  Hopefully home some time next week    LOS: 7 days    DORT, [facility] 10/05/2012, 7:32 AM Pager: [phone]

## 2012-10-05 NOTE — Progress Notes (Signed)
PARENTERAL NUTRITION CONSULT NOTE - Follow Up  Pharmacy Consult for TNA Indication: Bowel Obstruction  No Known Allergies  Patient Measurements: Height: 5\' 4"  (162.6 cm) Weight: 121 lb 9.6 oz (55.157 kg) [name] : 59.2   Vital Signs: Temp: 98.6 F (37 C) (08/21 0455) Temp src: Oral (08/21 0455) BP: 137/99 mmHg (08/21 0455) Pulse Rate: 122 (08/21 0553) Intake/Output from previous day: 08/20 0701 - 08/21 0700 In: 5148 [I.V.:4190.7; TPN:957.3] Out: 1350 [Urine:1000; Emesis/NG output:300; Blood:50] Intake/Output from this shift:    Labs:  Recent Labs  10/03/12 0535  WBC 6.8  HGB 10.9*  HCT 33.0*  PLT 340     Recent Labs  10/03/12 0535 10/04/12 0550 10/05/12 0535  NA 134* 133* 129*  K 3.9 4.1 4.3  CL 97 97 96  CO2 30 30 27   GLUCOSE 132* 124* 166*  BUN 7 14 20   CREATININE 0.98 1.04 0.96  CALCIUM 9.0 9.1 8.2*  MG 1.8  --  1.8  PHOS 3.6  --  3.2  PROT 7.7  --  6.9  ALBUMIN 3.0*  --  2.5*  AST 13  --  17  ALT 8  --  6  ALKPHOS 52  --  45  BILITOT 0.4  --  0.6  PREALBUMIN 8.7*  --   --   TRIG 73  --   --    Estimated Creatinine Clearance: 69.5 ml/min (by C-G formula based on Cr of 0.96).    Recent Labs  10/04/12 1957 10/04/12 2348 10/05/12 0549  GLUCAP 160* 133* 145*    Medical History: Past Medical History  Diagnosis Date  . Hypertension     no meds  . Cancer     rectal   . 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  10/12/2011    Insulin Requirements in the past 24 hours:  4 units SSI  Current Nutrition:  CLD started 8/20 Clinimix E 5/15 at 70 ml/hr 20% fat emulsion at 10 ml/hr  Per RD:  Clinimix E5/15 at 70 ml/hr with lipids at 64ml/hr will provide 1672 kcal and 84 g protein/day which will meet 101% estimated energy needs and 105% estimated protein needs  Assessment: 80 yoM with hx stage III rectal cancer with  metastasis to lung, peritoneum, right gluteal soft tissues s/p multiple bowel surgeries including loop ileostomy, small bowel resection, hernia repair, diverting loop colostomy 8/'13, and SBO 8/'13, 9/'13.  Presented to [facility] 8/14 with abdominal pain, nausea, and recurrent SBO confirmed by CT with obstruction likely realted to carcinomatosis.  NGT placed 8/14.   Surgery 8/20 - Ex Lap and intestinal bypass of close loop obstruction to the transverse colon; Placement of gastrostomy tube.    Lines:    Right chest port-a-cath IV team placed peripheral access 8/18 for fluids and IV medications.    Labs Lytes:  Na low 129, cannot adjust in TNA, other lytes WNL Renal: SCr 0.96, CrCl ~69 ml/min LFTs: WNL (8/21) Triglycerides: 73 (8/19) Pre-albumin: 8.7 (8/19) CBGs: controlled, no h/o DM.  SSI q4h - will reduce to q6h.  Day #4 TNA.  Patient tolerating well so advanced to goal rate on 8/20. Patient advanced to CLD on 8/20 and is tolerating okay, plan is to advance diet when bowel function returns  Nutritional Goals:  Per RD recommendations (8/19): 1650-1850 kCal, 80-95 grams of protein, 1.6-1.8L fluid per day Clinimix E5/15 at 70  ml/hr with lipids at 80ml/hr will provide 1672 kcal and 84 g protein/day  Plan:  At 1800 today:  Continue Clinimix E5/15 at goal rate of 10ml/hr.  Fat emulsion at 82ml/hr.   TNA to contain standard multivitamins and trace elements  [facility] at [facility]  Continue SSI  q6h  TNA lab panels on Mondays & Thursdays.  F/u daily.   BorgerdingLoma Messing PharmD Pager #: [phone] 9:34 AM 10/05/2012

## 2012-10-05 NOTE — Progress Notes (Signed)
Thank you for consulting the Palliative Medicine Team at [facility] to meet your patient's and family's needs.   The reason that you asked [place] to see your patient is for goals of care discussion  We have scheduled your patient for a meeting: Friday 8/22 @ 9:00 am  The Surrogate decision maker is: patient alert, oriented to person, place, time currently making his own decisions- patient stated he is agreeable to meeting with PMT and would like his girlfriend, Liborio Nixon (who is currently staying at bedside with patient) and his daughter [name] [phone]  to be present -if his mother, Conan Bowens, can be present she will also attend   Your patient is able to participate- on this visit, patient's girlfriend Liborio Nixon and mother Cordelia Pen at bedside, patient up in chair, stated pain is 'a little better'  Additional Narrative: daughter Justin Mend requests early meeting time because she has to be at work by 12 noon A courtesy voice message was left at Dr Kalman Drape office to inform of meeting     Valente David, RN 10/05/2012, 12:46 PM Palliative Medicine Team RN Liaison [phone]

## 2012-10-06 DIAGNOSIS — Z515 Encounter for palliative care: Secondary | ICD-10-CM

## 2012-10-06 DIAGNOSIS — C20 Malignant neoplasm of rectum: Secondary | ICD-10-CM

## 2012-10-06 DIAGNOSIS — K56609 Unspecified intestinal obstruction, unspecified as to partial versus complete obstruction: Secondary | ICD-10-CM

## 2012-10-06 LAB — GLUCOSE, CAPILLARY
Glucose-Capillary: 136 mg/dL — ABNORMAL HIGH (ref 70–99)
Glucose-Capillary: 85 mg/dL (ref 70–99)

## 2012-10-06 LAB — BASIC METABOLIC PANEL
CO2: 28 mEq/L (ref 19–32)
Calcium: 8.3 mg/dL — ABNORMAL LOW (ref 8.4–10.5)
Chloride: 96 mEq/L (ref 96–112)
Creatinine, Ser: 0.87 mg/dL (ref 0.50–1.35)
GFR calc non Af Amer: >90 mL/min (ref >90)
Potassium: 4.2 mEq/L (ref 3.5–5.1)
Sodium: 129 mEq/L — ABNORMAL LOW (ref 135–145)

## 2012-10-06 MED ORDER — TRACE MINERALS CR-CU-F-FE-I-MN-MO-SE-ZN IV SOLN
INTRAVENOUS | Status: DC
  Filled 2012-10-06: qty 2000

## 2012-10-06 MED ORDER — LORAZEPAM 2 MG/ML IJ SOLN: 1.0000 mg | INTRAMUSCULAR | Status: DC | PRN

## 2012-10-06 MED ORDER — ENSURE COMPLETE PO LIQD
237.0000 mL | Freq: Two times a day (BID) | ORAL | Status: DC
  Administered 2012-10-06 – 2012-10-09 (×5): 237 mL via ORAL

## 2012-10-06 MED ORDER — INSULIN ASPART 100 UNIT/ML ~~LOC~~ SOLN
0.0000 [IU] | Freq: Three times a day (TID) | SUBCUTANEOUS | Status: DC
  Administered 2012-10-06 (×2): 1 [IU] via SUBCUTANEOUS

## 2012-10-06 MED ORDER — FAT EMULSION 20 % IV EMUL
240.0000 mL | INTRAVENOUS | Status: DC
  Filled 2012-10-06: qty 250

## 2012-10-06 NOTE — Progress Notes (Signed)
2 Days Post-Op  Subjective: Pt feels okay, pain much improved today.  Pt sat in chair yesterday for 8 hours and ambulated some.  He wants his foley out.  Pt denies any N/V.  Tolerating small amounts of clears, but he doesn't like anything on the tray.  Wants to eat fulls and ensure.  Pt notes flatus from ostomy, but no BM's yet.  Objective: Vital signs in last 24 hours: Temp:  [98.5 F (36.9 C)-99.8 F (37.7 C)] 98.5 F (36.9 C) (08/22 0457) Pulse Rate:  [114-119] 115 (08/22 0457) Resp:  [16-20] 20 (08/22 0457) BP: (131-143)/(83-87) 143/83 mmHg (08/22 0457) SpO2:  [97 %-99 %] 98 % (08/22 0457) Last BM Date: 09/27/12  Intake/Output from previous day: 08/21 0701 - 08/22 0700 In: 490 [P.O.:480] Out: 2600 [Urine:1800; Drains:800] Intake/Output this shift:    PE: Gen:  Alert, NAD, pleasant Abd: Soft, mildly distended, mildly tender to palpation, no HSM, incision with sanguinous drainage over honeycomb dressing, Gtube in place, ostomy without flatus or BM currently, but patient notesevacuating flatus from his ostomy bag    Lab Results:  No results found for this basename: WBC, HGB, HCT, PLT,  in the last 72 hours BMET  Recent Labs  10/05/12 0535 10/06/12 0510  NA 129* 129*  K 4.3 4.2  CL 96 96  CO2 27 28  GLUCOSE 166* 137*  BUN 20 17  CREATININE 0.96 0.87  CALCIUM 8.2* 8.3*   PT/INR No results found for this basename: LABPROT, INR,  in the last 72 hours CMP     Component Value Date/Time   NA 129* 10/06/2012 0510   NA 140 08/22/2012 1152   K 4.2 10/06/2012 0510   K 3.8 08/22/2012 1152   CL 96 10/06/2012 0510   CL 104 07/26/2012 0744   CO2 28 10/06/2012 0510   CO2 29 08/22/2012 1152   GLUCOSE 137* 10/06/2012 0510   GLUCOSE 113 08/22/2012 1152   GLUCOSE 100* 07/26/2012 0744   BUN 17 10/06/2012 0510   BUN 15.3 08/22/2012 1152   CREATININE 0.87 10/06/2012 0510   CREATININE 1.1 08/22/2012 1152   CALCIUM 8.3* 10/06/2012 0510   CALCIUM 9.4 08/22/2012 1152   PROT 6.9 10/05/2012 0535   PROT 8.2 08/22/2012 1152   ALBUMIN 2.5* 10/05/2012 0535   ALBUMIN 3.2* 08/22/2012 1152   AST 17 10/05/2012 0535   AST 16 08/22/2012 1152   ALT 6 10/05/2012 0535   ALT 12 08/22/2012 1152   ALKPHOS 45 10/05/2012 0535   ALKPHOS 62 08/22/2012 1152   BILITOT 0.6 10/05/2012 0535   BILITOT 0.60 08/22/2012 1152   GFRNONAA >90 10/06/2012 0510   GFRAA >90 10/06/2012 0510   Lipase     Component Value Date/Time   LIPASE 38 11/07/2011 1700       Studies/Results: No results found.  Anti-infectives: Anti-infectives   Start     Dose/Rate Route Frequency Ordered Stop   10/04/12 0745  ceFAZolin (ANCEF) 3 g in dextrose 5 % 50 mL IVPB  Status:  Discontinued     3 g 160 mL/hr over 30 Minutes Intravenous On call to O.R. 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 IVF, PCA, antiemetics  2. Full liquids, dietitian consult, d/c foley 3. Palliative care meeting at [facility]  to address goals of care 4. Ambulate and IS  5. SCD's and lovenox  6. Hopefully home some time next week 7.  Will not tolerate treatment for 6-8 weeks, concerned he may not survive beyond that time frame given his widespread carcinamotosis     LOS: 8 days    DORT, [facility] 10/06/2012, 7:18 AM Pager: [phone]

## 2012-10-06 NOTE — Progress Notes (Signed)
Need to define goals of care.  Unlikely to derive benefit from any other intervention.

## 2012-10-06 NOTE — Progress Notes (Signed)
I discussed the operative findings with [name] yesterday and again this morning. I discussed the case with Dr. Luisa Hart. It is unlikely [name] will be able to maintain his nutrition with oral intake.  The cancer has progressed despite multiple systemic chemotherapy regimens. Systemic treatment options are limited. It is unlikely he will be a candidate for a clinical trial.  I discussed hospice care with [name] and he appears agreeable. I agree with a palliative care consultation.  Recommendations:  1. Continue postop care per Dr. Luisa Hart 2. [facility] hospice referral for home care 3. Please call oncology as needed over the weekend. I will see him on 10/09/2012.

## 2012-10-06 NOTE — Clinical Documentation Improvement (Signed)
THIS DOCUMENT IS NOT A PERMANENT PART OF THE MEDICAL RECORD  Please update your documentation with the medical record to reflect your response to this query. If you need help knowing how to do this please call [phone].  10/06/12   Dear Aris Georgia, PA-C  / Associates,  In a better effort to capture your patient's severity of illness, reflect appropriate length of stay and utilization of resources, a review of the patient medical record has revealed the following indicators.    Based on your clinical judgment, please clarify and document in a progress note and/or discharge summary the clinical condition associated with the following supporting information:  In responding to this query please exercise your independent judgment.  The fact that a query is asked, does not imply that any particular answer is desired or expected.  Patient received nutritional counseling on 10/03/12 with clinical indicators for possible Clinical Conditions. Please clarify the specific diagnosis in progress notes. Thank you.  _______Mild Malnutrition  _______Moderate Malnutrition _______Severe Malnutrition   _______Protein Calorie Malnutrition _______Severe Protein Calorie Malnutrition _ _______Other Condition________________ _______Cannot clinically determine     Supporting Information: Risk Factors:history of stage III rectal cancer s/p pelvic radiation, laparoscopic lower anterior resection with a diverting loop ileostomy 12/2009, metastatic lung cancer diagnosed in 02/2011, strangulated peristomal hernia and small bowel obstruction s/p reduction of hernia, omentectomy, exploratory laparotomy, small bowel resection in 01/2010, left adrenal nodule, failure to thrive, HTN, diverting loop colostomy 09/2011   Signs & Symptoms: Ht: 5'4"    Wt: 121 lbs    BMI:  20  Diagnostics: Nutrition Focused Physical Exam:  Subcutaneous Fat:  Orbital Region: mild/moderate wasting  Upper Arm Region: mild/moderate wasting   Thoracic and Lumbar Region: NA  Muscle:  Temple Region: mild/moderate wasting  Clavicle Bone Region: mild/moderate wasting  Clavicle and Acromion Bone Region: mild/moderate wasting  Scapular Bone Region: NA  Dorsal Hand: mild/moderate wasting  Patellar Region: mild/moderate wasting  Anterior Thigh Region: mild/moderate wasting  Posterior Calf Region: mild/moderate wasting       Treatment : INTERVENTION:  - TPN per pharmacy Continuous Infusions:  .  Marland KitchenTPN (CLINIMIX-E) Adult  40 mL/hr at 10/02/12 1829    And   .  fat emulsion  250 mL (10/02/12 1829)   .  Marland KitchenTPN (CLINIMIX-E) Adult      - Diet advancement per MD  - Will continue to monitor  Weights, labs, NGT output, TPN, diet advancement   Nutrition Consult: Pt meets criteria for severe MALNUTRITION in the context of chronic illness as evidenced by <50-75% estimated energy intake with 5.5% weight loss in the past month    You may use possible, probable, or suspect with inpatient documentation. possible, probable, suspected diagnoses MUST be documented at the time of discharge  Reviewed: additional documentation in the medical record  (noted  In d/c summary 10/09/12)    GTatum  Thank You,  Andy Gauss  Clinical Documentation Specialist: [phone] Health Information Management [place]

## 2012-10-06 NOTE — Progress Notes (Addendum)
PARENTERAL NUTRITION CONSULT NOTE - Follow Up  Pharmacy Consult for TNA Indication: Bowel Obstruction  No Known Allergies  Patient Measurements: Height: 5\' 4"  (162.6 cm) Weight: 121 lb 9.6 oz (55.157 kg) [name] : 59.2   Vital Signs: Temp: 98.5 F (36.9 C) (08/22 0457) Temp src: Oral (08/22 0457) BP: 143/83 mmHg (08/22 0457) Pulse Rate: 115 (08/22 0457) Intake/Output from previous day: 08/21 0701 - 08/22 0700 In: 490 [P.O.:480] Out: 2600 [Urine:1800; Drains:800]     Recent Labs  10/04/12 0550 10/05/12 0535 10/06/12 0510  NA 133* 129* 129*  K 4.1 4.3 4.2  CL 97 96 96  CO2 30 27 28   GLUCOSE 124* 166* 137*  BUN 14 20 17   CREATININE 1.04 0.96 0.87  CALCIUM 9.1 8.2* 8.3*  MG  --  1.8  --   PHOS  --  3.2  --   PROT  --  6.9  --   ALBUMIN  --  2.5*  --   AST  --  17  --   ALT  --  6  --   ALKPHOS  --  45  --   BILITOT  --  0.6  --    Estimated Creatinine Clearance: 76.7 ml/min (by C-G formula based on Cr of 0.87).    Recent Labs  10/05/12 1745 10/06/12 0025 10/06/12 0636  GLUCAP 133* 135* 139*    Medical History: Past Medical History  Diagnosis Date  . Hypertension     no meds  . Cancer     rectal   . 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  10/12/2011    Insulin Requirements in the past 24 hours:  3 units SSI  Current Nutrition:  CLD started 8/20 Clinimix E 5/15 at 70 ml/hr 20% fat emulsion at 10 ml/hr  Per RD:  Clinimix E5/15 at 70 ml/hr with lipids at 10ml/hr will provide 1672 kcal and 84 g protein/day which will meet 101% estimated energy needs and 105% estimated protein needs  Assessment: 55 yoM with hx stage III rectal cancer with metastasis to lung, peritoneum, right gluteal soft tissues s/p multiple bowel surgeries including loop ileostomy, small bowel resection, hernia repair, diverting loop colostomy 8/'13, and SBO  8/'13, 9/'13.  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  NGT placed 8/14.   Surgery 8/20 - Ex Lap and intestinal bypass of close loop obstruction to the transverse colon; Placement of gastrostomy tube.    Lines:    Right chest port-a-cath IV team placed peripheral access 8/18 for fluids and IV medications.    Labs Lytes:  Na low 129, other lytes WNL Renal: SCr 0.87, CrCl ~75 ml/min LFTs: WNL (8/21) Triglycerides: 73 (8/19) Pre-albumin: 8.7 (8/19) CBGs: controlled, no h/o DM.  SSI q6h  Day #5 TNA.  Patient tolerating well so advanced to goal rate on 8/20. Patient advanced to CLD on 8/20 and is tolerating small amounts okay, plan is to advance diet when bowel function returns. GOC meeting with palliative Care scheduled for 0900 today  No colostomy outpt past 24h  gastrostomy outpt past 24h  Nutritional Goals:  Per RD recommendations (8/19): 1650-1850 kCal, 80-95 grams of protein, 1.6-1.8L fluid per day Clinimix E5/15 at 70 ml/hr with lipids at 1ml/hr will provide 1672 kcal and 84 g protein/day  Plan:  At  1800 today:  Continue Clinimix E5/15 at goal rate of 27ml/hr.  Fat emulsion at 1ml/hr.   TNA to contain standard multivitamins and trace elements  [facility] at [facility]  Continue SSI  q6h  TNA lab panels on Mondays & Thursdays.  F/u daily.    Thank you for the consult.  Tomi Bamberger, PharmD Clinical Pharmacist Pager: [phone] Pharmacy: [phone] 10/06/2012 7:43 AM      Addendum  After GOC meeting this morning, family and patient have decided to discontinue TNA and move to oral feeding with open gastrostomy. TNA will run until 1800 tonight and then be discontinued per discussion with Palliative Medicine and CCS.  Plan: - finish current TNA bag - d/c TPN labs - change CBG checks and SSI to TID WC and qhs  Netta Cedars, PharmD [phone] 10/06/2012 10:50  AM

## 2012-10-06 NOTE — Progress Notes (Signed)
NUTRITION FOLLOW UP  Intervention:   - TPN per pharmacy - Nutrition per goals of care - Recommend diet liberalization/comfort feeds with whatever pt wants to eat as pt with very poor prognosis per MD notes - Will continue to monitor   Nutrition Dx:   Altered GI function related to small bowel obstruction as evidenced by MD notes - ongoing    Goal:   TPN to meet >90% of estimated nutritional needs -  met   Monitor:   Weights, labs, intake, TPN, goals of care   Assessment:   POD# 2 exploratory laparotomy with G tube placement. Palliative care meeting with pt today, in with pt now. Started on full liquid diet today, pt requested Ensure which was ordered. Pt with total output from G tube yesterday. No colostomy output yesterday. MD noted pt not likely to survive past 6-8 weeks. No new weights since admission.   Clinimix E5/15 at 70 ml/hr with lipids at 60ml/hr will provide 1672 kcal and 84 g protein/day which will meet 101% estimated energy needs and 105% estimated protein needs  Sodium remains low CBGs < 150 mg/dL  Height: Ht Readings from Last 1 Encounters:  09/28/12 5\' 4"  (1.626 m)    Weight Status:   Wt Readings from Last 1 Encounters:  09/28/12 121 lb 9.6 oz (55.157 kg)    Re-estimated needs:  Kcal: 1650-1850 Protein: 80-95g Fluid: 1.6-1.8L/day  Skin: Intact   Diet Order: Full Liquid   Intake/Output Summary (Last 24 hours) at 10/06/12 0919 Last data filed at 10/06/12 0834  Gross per 24 hour  Intake    490 ml  Output   3075 ml  Net  -2585 ml    Last BM: 8/13   Labs:   Recent Labs Lab 10/03/12 0535 10/04/12 0550 10/05/12 0535 10/06/12 0510  NA 134* 133* 129* 129*  K 3.9 4.1 4.3 4.2  CL 97 97 96 96  CO2 30 30 27 28   BUN 7 14 20 17   CREATININE 0.98 1.04 0.96 0.87  CALCIUM 9.0 9.1 8.2* 8.3*  MG 1.8  --  1.8  --   PHOS 3.6  --  3.2  --   GLUCOSE 132* 124* 166* 137*    CBG (last 3)   Recent Labs  10/05/12 1745 10/06/12 0025  10/06/12 0636  GLUCAP 133* 135* 139*    Scheduled Meds: . enoxaparin (LOVENOX) injection  40 mg Subcutaneous Q24H  . feeding supplement  237 mL Oral BID BM  . HYDROmorphone PCA 0.3 mg/mL   Intravenous Q4H  . insulin aspart  0-9 Units Subcutaneous Q6H  . metoprolol  5 mg Intravenous Q6H    Continuous Infusions: . Marland KitchenTPN (CLINIMIX-E) Adult 70 mL/hr at 10/05/12 1728   And  . fat emulsion 240 mL (10/05/12 1729)  . Marland KitchenTPN (CLINIMIX-E) Adult    . dextrose 5 % and 0.9 % NaCl with KCl 20 mEq/L 20 mL/hr at 10/05/12 0416  . fat emulsion       Levon Hedger MS, RD, [place] [phone] Pager [phone] After Hours Pager

## 2012-10-06 NOTE — Progress Notes (Signed)
Palliative Medicine Team SW Referred for psychosocial support, pt and family familiar to me from previous admission. Met today with pt and s/o at bedside. Pt alert/oriented x3, appears somewhat impatient with conversation, but cooperative. Pt reports this morning's GOC was "difficult to set anything in stone" due to family dynamics and varying ideas of what his plan should be. Efforts made to ascertain pt's true understanding of his condition. Pt reports understanding that his illness has advanced, but states "I think I can get 6 more months, that would be enough for me". Pt reports efforts to consume Ensure as a measure of sustaining himself nutritionally. Pt reports his disdain for being in the hospital. Encouraged pt to discuss limiting hospitalizations with medical team. Pt reports Hospice services "is okay with me, if they can come to the house".   Pt currently lives in his mother's home, where his s/o other also lives. There is some disagreement about whether pt's mother is willing to allow pt to return home and whether she can undertake further caregiving. Pt reports he has "other places to go" in the event that mother is ultimately unwilling to allow Hospice services in her home. Later spoke with pt's s/o outside the room who reports some friction between herself and pt's family. S/o reports pt's family wants pt in a residential facility and that she just wants "whatever he wants, I just want to be by his side" and is concerned that she would not be welcomed if he were to enter residential hospice. S/o reports her perception that pt "knows [his time ] might be shorter than months" and that "he just talks that way" to stay positive.  Encouraged s/o to engage pt in discussion about where he ultimately wants his EOL process to take place and to think about his final affairs, although she is afraid he will become angry with her. Encouraged pt to fill out advance directive, specifically HPOA portion; pt in  agreement of necessity, but requested I leave a booklet in room, says he will "go over it tomorrow" despite offer to go over booklet with him.   Will follow up Monday if pt still here.   Kennieth Francois, LCSW PMT Phone [phone] Pager [phone]

## 2012-10-06 NOTE — Consult Note (Signed)
Palliative Medicine Team at [facility]  Date: 10/06/2012   Patient Name: Jeffery [name]  DOB: [birth year]  MRN: [id number]  Age / Sex: 52 y.o., male   PCP: Renaye Rakers, MD Referring Physician: Bishop Limbo, MD  HPI/Reason for Consultation: This is a 52 year old gentleman who has stage III rectal cancer status post multiple abdominal surgeries including a diverting loop ileostomy, small bowel resection, multiple exploratory laparotomies for SBO and most recently was taken to the operating room on 09/28/2012 for recurrent bowel obstruction in the setting of intra-abdominal carcinomatosis in an attempt to palliate his bowel distention from high tumor burden and complete obstruction. In addition to resection of the affected bowel a G-tube was placed for gastric decompression and venting for comfort care needs. Overall he has a very poor prognosis felt to be <8 weeks related to his wide spread intra-abdominal mets. PMT asked to assist with goals of care.  Participants in Discussion: There is a complex family dynamic. Present for the meeting was his mother and his significant other/girlfriend and his daughter, his sister was on speaker phone. Patient has been living with his mother and there is clearly tension in the room what I suspect his caregiver burnout.  Goals/Summary of Discussion:  1. Code Status:  DNR, patient made his own choice on this matter  2. Scope of Treatment:  Continue to provide a very high level of medical intervention and support especially postoperatively as he recovers from his intestinal bypass and in management of his G-tube. Patient would like his hope supported and maintained that he can continue to live for as long as possible and take in nutrition. He is very aware/realistic that his time is limited and he is accepting of a for comfort approach since most reasonable medical interventions have been exhausted.  Discontinue TNA (per surgery)-doubt this is contributing to his  comfort and it seems he is taking in full liquids. He does not wish to be connected to an infusion upon discharge  for the time he has left.  He desires a very high level of pain and anxiety control. Right now he is on IV dilaudid infusion-prior to admission he was on MS Contin 60mg  BID and percocet for breakthrough pain. Will need to transition him to a SL or transdermal regimen at discharge-continue PCA until closer to discharge then we will calculate a total opiate daily dose and transition him to either SL morphine in divided doses or duragesic patch. Will also prescribe a benzo for anxiety.  Comfort feeding as tolerated. Will need education on venting gastrostomy.He loves to cook and wants to eat good food and this has been very hard for him.  Patient desires to keep his independence for as long as possible.  Agrees to Hospice Care at discharge.  3. Assessment/Plan: 52 yo with advanced rectal cancer and intra-abdominal carcinomatosis, multiple issues with malignant bowel obstruction-post op from intestinal bypass and venting PEG/gastrostomy. He is clearly entering the terminal phase of his illness. Aggressive treatment options have been exhausted and a focus on comfort and palliative care is his best option.   Pain recs above.  If malignant obstruction persists would recommend decadron, PPI, H2 blocker and scheduled zofran to control GI secretions, nausea and edema.  No long term TNA  Ativan for anxiety and bowel spasms, may be opiate sparing-depending on GI physiology may need bowel regimen   4. Palliative Prophylaxis:   Bowel Regimen- per surgery/ileostomy output  Terminal Secretions-NA at this point but may do well  with scop patch especially if he is having nausea and cramping pain  Breakthrough Pain and Dyspnea-transition to Roxanol eventually  Agitation and Delirium: Ativan SL  Nausea: Zofran ODT  5. Psychosocial Spiritual Asssessment/Interventions:  Patient and Family  Adjustment to Illness/Prognosis: Complex issues. Mr. Andrus has been difficult to take care of according to family- Mother "he doesn't listen to me", his daughter tells me that his mother doesn't want to take care of him anymore and that it is too much on her,. I tried to help family understand his experience with his illness and encouraged compassion and love at this difficult time. He also has a girlfriend who helps him, but there appears to be conflict involving his mother because he lives with her-he very much wants to "go home" but I suspect that he doesn't really have a home that is fully his-he lives with his mother and there seems to be some resentment that I was unable to determine during my meeting today. I further encouraged them to talk as a family. See dispo below.   Spiritual Concerns or Needs: Will ask chaplain to see.  6. Disposition: Mr. Otting although profoundly weak is still very mentally sharp, physically can walk and has some fairly good functional capacity-this may deteriorate but being immediately post op he seems to be in pretty good condition. He is open to going home with Hospice-some family however is pushing for residential Hospice or [facility]. I explained that he can go home and then transition to [facility] if his care becomes too much and also for respite care if needed.   Social History:   reports that he has been smoking Cigarettes.  He has a 3 pack-year smoking history. He has never used smokeless tobacco. He reports that  drinks alcohol. He reports that he does not use illicit drugs.  Family History: Family History  Problem Relation Age of Onset  . Cancer Mother     lung  . Cancer Paternal Uncle     Active Medications:  Outpatient medications: Prescriptions prior to admission  Medication Sig Dispense Refill  . acetaminophen (TYLENOL) 325 MG tablet Take 1-2 tablets (325-650 mg total) by mouth every 6 (six) hours as needed for pain or fever.  80 tablet  3   . amLODipine (NORVASC) 10 MG tablet Take 1 tablet (10 mg total) by mouth daily.  30 tablet  3  . morphine (MS CONTIN) 60 MG 12 hr tablet Take 1 tablet (60 mg total) by mouth every 8 (eight) hours. Not to be filled until the last week of August. Can take every 12 hrs if pain improves.  90 tablet  0  . oxyCODONE-acetaminophen (PERCOCET) 10-325 MG per tablet Take 1 tablet by mouth every 6 (six) hours as needed for pain.  100 tablet  0  . diphenoxylate-atropine (LOMOTIL) 2.5-0.025 MG per tablet Take 1 tablet by mouth 4 (four) times daily as needed for diarrhea or loose stools.  30 tablet  2  . lidocaine-prilocaine (EMLA) cream Apply topically as needed. Apply to [facility] site 1-2 hours prior to stick and cover with plastic wrap  30 g  prn    Current medications: Infusions: . Marland KitchenTPN (CLINIMIX-E) Adult 70 mL/hr at 10/05/12 1728   And  . fat emulsion 240 mL (10/05/12 1729)  . Marland KitchenTPN (CLINIMIX-E) Adult    . dextrose 5 % and 0.9 % NaCl with KCl 20 mEq/L 20 mL/hr at 10/05/12 0416  . fat emulsion      Scheduled  Medications: . enoxaparin (LOVENOX) injection  40 mg Subcutaneous Q24H  . feeding supplement  237 mL Oral BID BM  . HYDROmorphone PCA 0.3 mg/mL   Intravenous Q4H  . insulin aspart  0-9 Units Subcutaneous Q6H  . metoprolol  5 mg Intravenous Q6H    PRN Medications: diphenhydrAMINE, diphenhydrAMINE, HYDROmorphone (DILAUDID) injection, naloxone, ondansetron, ondansetron (ZOFRAN) IV, sodium chloride   Vital Signs: BP 143/83  Pulse 115  Temp(Src) 98.5 F (36.9 C) (Oral)  Resp 17  Ht 5\' 4"  (1.626 m)  Wt 55.157 kg (121 lb 9.6 oz)  BMI 20.86 kg/m2  SpO2 99%   Physical Exam:  General appearance: NAD, conversant, thin, weak appearing Eyes: anicteric sclerae, moist conjunctivae; no lid-lag; PERRLA HENT: Atraumatic; oropharynx clear with moist mucous membranes and no mucosal ulcerations; normal hard and soft palate Neck: Trachea midline; FROM, supple, no thyromegaly or lymphadenopathy Lungs:  decreased bilateral breath sounds CV: RRR, no MRGs  Abdomen: Distended, Gtube, drgs Extremities: +edema Skin: Normal temperature, turgor and texture; no rash, ulcers or subcutaneous nodules Psych: Appropriate affect, alert and oriented to person, place and time  Labs:  Basic or Comprehensive Metabolic Panel:    Component Value Date/Time   NA 129* 10/06/2012 0510   NA 140 08/22/2012 1152   K 4.2 10/06/2012 0510   K 3.8 08/22/2012 1152   CL 96 10/06/2012 0510   CL 104 07/26/2012 0744   CO2 28 10/06/2012 0510   CO2 29 08/22/2012 1152   BUN 17 10/06/2012 0510   BUN 15.3 08/22/2012 1152   CREATININE 0.87 10/06/2012 0510   CREATININE 1.1 08/22/2012 1152   GLUCOSE 137* 10/06/2012 0510   GLUCOSE 113 08/22/2012 1152   GLUCOSE 100* 07/26/2012 0744   CALCIUM 8.3* 10/06/2012 0510   CALCIUM 9.4 08/22/2012 1152   AST 17 10/05/2012 0535   AST 16 08/22/2012 1152   ALT 6 10/05/2012 0535   ALT 12 08/22/2012 1152   ALKPHOS 45 10/05/2012 0535   ALKPHOS 62 08/22/2012 1152   BILITOT 0.6 10/05/2012 0535   BILITOT 0.60 08/22/2012 1152   PROT 6.9 10/05/2012 0535   PROT 8.2 08/22/2012 1152   ALBUMIN 2.5* 10/05/2012 0535   ALBUMIN 3.2* 08/22/2012 1152     CBC:    Component Value Date/Time   WBC 6.8 10/03/2012 0535   WBC 7.7 08/22/2012 1152   HGB 10.9* 10/03/2012 0535   HGB 12.6* 08/22/2012 1152   HCT 33.0* 10/03/2012 0535   HCT 37.2* 08/22/2012 1152   PLT 340 10/03/2012 0535   PLT 375 08/22/2012 1152   MCV 92.4 10/03/2012 0535   MCV 99.2* 08/22/2012 1152   NEUTROABS 5.6 10/03/2012 0535   NEUTROABS 6.0 08/22/2012 1152   LYMPHSABS 0.4* 10/03/2012 0535   LYMPHSABS 0.8* 08/22/2012 1152   MONOABS 0.7 10/03/2012 0535   MONOABS 0.9 08/22/2012 1152   EOSABS 0.1 10/03/2012 0535   EOSABS 0.0 08/22/2012 1152   BASOSABS 0.0 10/03/2012 0535   BASOSABS 0.0 08/22/2012 1152   Other Data:  (2D echo, EKG...)  Educational Materials Given:  DNR: Yes  MOST: No Healthcare Power-of-Attorney: No   Time: 70 minutes Greater than 50%  of this time was spent  counseling and coordinating care related to the above assessment and plan.  Signed by: Edsel Petrin, DO  10/06/2012, 9:44 AM  Please contact Palliative Medicine Team phone at [phone] for questions and concerns.

## 2012-10-07 MED ORDER — OXYCODONE HCL 5 MG PO TABS
10.0000 mg | ORAL_TABLET | Freq: Four times a day (QID) | ORAL | Status: DC | PRN
  Administered 2012-10-09: 10 mg via ORAL
  Filled 2012-10-07: qty 2

## 2012-10-07 MED ORDER — SODIUM CHLORIDE 0.9 % IJ SOLN
10.0000 mL | INTRAMUSCULAR | Status: DC | PRN
  Administered 2012-10-08 – 2012-10-09 (×3): 10 mL

## 2012-10-07 MED ORDER — CHLORPROMAZINE HCL 25 MG PO TABS
25.0000 mg | ORAL_TABLET | Freq: Four times a day (QID) | ORAL | Status: DC | PRN
  Administered 2012-10-07 – 2012-10-08 (×5): 25 mg via ORAL
  Filled 2012-10-07 (×6): qty 1

## 2012-10-07 NOTE — Progress Notes (Signed)
3 Days Post-Op  Subjective: Looks well.  Gas in bag walking halls well.  Objective: Vital signs in last 24 hours: Temp:  [98.2 F (36.8 C)-100.1 F (37.8 C)] 98.2 F (36.8 C) (08/23 0545) Pulse Rate:  [108-126] 114 (08/23 0545) Resp:  [16-20] 16 (08/23 0545) BP: (117-131)/(80-84) 126/84 mmHg (08/23 0545) SpO2:  [97 %-100 %] 98 % (08/23 0545) Last BM Date: 10/06/12  Intake/Output from previous day: 08/22 0701 - 08/23 0700 In: 1492.7 [P.O.:240; I.V.:452.7; TPN:800] Out: 4675 [Urine:2325; Drains:1450; Stool:900] Intake/Output this shift:    Incision/Wound:GT site clean.  Working.  Wound open and clean ostomy functioning.  Lab Results:  No results found for this basename: WBC, HGB, HCT, PLT,  in the last 72 hours BMET  Recent Labs  10/05/12 0535 10/06/12 0510  NA 129* 129*  K 4.3 4.2  CL 96 96  CO2 27 28  GLUCOSE 166* 137*  BUN 20 17  CREATININE 0.96 0.87  CALCIUM 8.2* 8.3*   PT/INR No results found for this basename: LABPROT, INR,  in the last 72 hours ABG No results found for this basename: PHART, PCO2, PO2, HCO3,  in the last 72 hours  Studies/Results: No results found.  Anti-infectives: Anti-infectives   Start     Dose/Rate Route Frequency Ordered Stop   10/04/12 0745  ceFAZolin (ANCEF) 3 g in dextrose 5 % 50 mL IVPB  Status:  Discontinued     3 g 160 mL/hr over 30 Minutes Intravenous On call to O.R. 10/04/12 0742 10/04/12 1340      Assessment/Plan: s/p Procedure(s) with comments: EXPLORATORY LAPAROTOMY,  (N/A) - GTUBE PLACEMENT ADVANCE DIET D/C PAC Doing remarkably well.   Try for D/C Monday with Hospice.  May not need home TNA.  Will see how he does eating.    LOS: 9 days    Jeffery Tristan A. 10/07/2012

## 2012-10-08 NOTE — Progress Notes (Signed)
Patient ID: Jeffery [name], male   DOB: 05-27-1960, 52 y.o.   MRN: [id number] 4 Days Post-Op  Subjective: No complaints this morning. Up out of bed and dressed and walking. Occasional pain well controlled with medicines. Has been eating he denies nausea.  Objective: Vital signs in last 24 hours: Temp:  [98.3 F (36.8 C)-99.9 F (37.7 C)] 98.3 F (36.8 C) (08/24 0540) Pulse Rate:  [108-118] 108 (08/24 0540) Resp:  [18-19] 18 (08/24 0540) BP: (137-149)/(89-101) 149/100 mmHg (08/24 0540) SpO2:  [95 %-99 %] 96 % (08/24 0540) Last BM Date: 10/07/12  Intake/Output from previous day: 08/23 0701 - 08/24 0700 In: 722.7 [P.O.:240; I.V.:482.7] Out: 2200 [Urine:1100; Drains:1000; Stool:100] Intake/Output this shift:    General appearance: alert, cooperative and no distress GI: normal findings: soft, non-tender and has gas per ostomy Incision/Wound: dressing clean and dry  Lab Results:  No results found for this basename: WBC, HGB, HCT, PLT,  in the last 72 hours BMET  Recent Labs  10/06/12 0510  NA 129*  K 4.2  CL 96  CO2 28  GLUCOSE 137*  BUN 17  CREATININE 0.87  CALCIUM 8.3*     Studies/Results: No results found.  Anti-infectives: Anti-infectives   Start     Dose/Rate Route Frequency Ordered Stop   10/04/12 0745  ceFAZolin (ANCEF) 3 g in dextrose 5 % 50 mL IVPB  Status:  Discontinued     3 g 160 mL/hr over 30 Minutes Intravenous On call to O.R. 10/04/12 0742 10/04/12 1340      Assessment/Plan: s/p Procedure(s): EXPLORATORY LAPAROTOMY, internal bypass and g tube placement Seems to be eating pretty well. Stable. Anticipating discharge with hospice tomorrow.    LOS: 10 days    Talayla Doyel T 10/08/2012

## 2012-10-08 NOTE — Progress Notes (Signed)
Patient NW:GNFAO A Jeffery [name]      DOB: 1960-11-29      ZHY:[id number]   Check in with patient and significant other Jeffery [name]. Patient is lying in the recliner in no acute distress he states that he will be leaving tomorrow to go home. He relates that he desires home health support and not hospice care. At this time he reports no needs. He states that his pain will be controlled with oral oxycodone which is his usual medication. Certainly this could be used at discharge if patient is able to take by mouth or via his tube. He is not interested in hospice support at this time. Social work should arrange for maximal outpatient home health support.    Manpreet Kemmer L. Ladona Ridgel, MD MBA The Palliative Medicine Team at [facility] Phone: [phone] Pager: [phone]

## 2012-10-09 ENCOUNTER — Telehealth: Payer: Self-pay | Admitting: Oncology

## 2012-10-09 ENCOUNTER — Other Ambulatory Visit: Payer: Self-pay | Admitting: *Deleted

## 2012-10-09 ENCOUNTER — Other Ambulatory Visit: Payer: Self-pay | Admitting: Nurse Practitioner

## 2012-10-09 DIAGNOSIS — C20 Malignant neoplasm of rectum: Secondary | ICD-10-CM

## 2012-10-09 DIAGNOSIS — C261 Malignant neoplasm of spleen: Secondary | ICD-10-CM

## 2012-10-09 MED ORDER — OXYCODONE HCL 10 MG PO TABS: 10.0000 mg | ORAL_TABLET | Freq: Four times a day (QID) | ORAL | Status: DC | PRN

## 2012-10-09 MED ORDER — AMLODIPINE BESYLATE 10 MG PO TABS
10.0000 mg | ORAL_TABLET | Freq: Every day | ORAL | Status: DC
  Administered 2012-10-09: 10 mg via ORAL
  Filled 2012-10-09: qty 1

## 2012-10-09 MED ORDER — HEPARIN SOD (PORK) LOCK FLUSH 100 UNIT/ML IV SOLN
500.0000 [IU] | INTRAVENOUS | Status: AC | PRN
  Administered 2012-10-09: 500 [IU]

## 2012-10-09 MED ORDER — ONDANSETRON 4 MG PO TBDP
4.0000 mg | ORAL_TABLET | Freq: Three times a day (TID) | ORAL | Status: DC | PRN
  Filled 2012-10-09: qty 2

## 2012-10-09 MED ORDER — CLONAZEPAM 0.5 MG PO TBDP: 0.5000 mg | ORAL_TABLET | Freq: Two times a day (BID) | ORAL | Status: DC | PRN

## 2012-10-09 MED ORDER — OXYCODONE-ACETAMINOPHEN 5-325 MG PO TABS: 1.0000 | ORAL_TABLET | ORAL | Status: DC | PRN

## 2012-10-09 MED ORDER — METOPROLOL TARTRATE 12.5 MG HALF TABLET
12.5000 mg | ORAL_TABLET | Freq: Two times a day (BID) | ORAL | Status: DC
  Filled 2012-10-09 (×2): qty 1

## 2012-10-09 MED ORDER — ENSURE COMPLETE PO LIQD: 237.0000 mL | Freq: Two times a day (BID) | ORAL | Status: DC

## 2012-10-09 MED ORDER — ONDANSETRON 4 MG PO TBDP: 4.0000 mg | ORAL_TABLET | Freq: Three times a day (TID) | ORAL | Status: DC | PRN

## 2012-10-09 MED ORDER — CLONAZEPAM 0.5 MG PO TBDP
0.5000 mg | ORAL_TABLET | Freq: Two times a day (BID) | ORAL | Status: DC | PRN
  Filled 2012-10-09: qty 1

## 2012-10-09 MED ORDER — METOPROLOL TARTRATE 1 MG/ML IV SOLN
5.0000 mg | Freq: Four times a day (QID) | INTRAVENOUS | Status: DC
  Administered 2012-10-09: 5 mg via INTRAVENOUS
  Filled 2012-10-09 (×4): qty 5

## 2012-10-09 NOTE — Progress Notes (Signed)
Surgical team provided pain med script. Voided Percocet script.

## 2012-10-09 NOTE — Progress Notes (Signed)
Patient being discharged today and needs pain med script per Dr. Truett Perna. Will write for Percocet if surgeon office does not provide script. Needs to be seen 10/20/12 at 12:15 per BS. POF to scheduler.

## 2012-10-09 NOTE — Care Management Note (Signed)
    Page 1 of 1   10/09/2012     11:14:32 AM   CARE MANAGEMENT NOTE 10/09/2012  Patient:  Jeffery [name], Jeffery [name]   Account Number:  [id number]  Date Initiated:  09/29/2012  Documentation initiated by:  Lorenda Ishihara  Subjective/Objective Assessment:   52 yo male admitted with SBO, pmh mets cancer. PTA lived at home with spouse.     Action/Plan:   Home when stable.   Anticipated DC Date:  10/09/2012   Anticipated DC Plan:  HOME W HOME HEALTH SERVICES      DC Planning Services  CM consult      [facility] Choice  HOME HEALTH   Choice offered to / List presented to:  C-1 Patient        HH arranged  HH-1 RN  HH-4 NURSE'S AIDE      HH agency  [facility]   Status of service:  Completed, signed off Medicare Important Message given?   (If response is "NO", the following Medicare IM given date fields will be blank) Date Medicare IM given:   Date Additional Medicare IM given:    Discharge Disposition:  HOME W HOME HEALTH SERVICES  Per UR Regulation:  Reviewed for med. necessity/level of care/duration of stay  If discussed at Long Length of Stay Meetings, dates discussed:   10/05/2012    Comments:

## 2012-10-09 NOTE — Discharge Summary (Signed)
ATTENDING ADDENDUM:  I personally reviewed patient's record, examined the patient, and formulated the following assessment and plan:  Doing well.  D/C home with HH.  Pt will call hospice when needed

## 2012-10-09 NOTE — Progress Notes (Signed)
Pt for d/c home today with HHC as ordered. Pt will d/c with G-tube to Foley drain. Pt & girlfriend instructed about flushing tube & emptying drain. Both states have done it before. Colostomy intact to LLQ. Dressing changed to abdomen & dressing supplies provided for home use. Discharge instructions & RX given with verbalized understanding.

## 2012-10-09 NOTE — Telephone Encounter (Signed)
Added appt for 9/5 @ 12:15pm. D/t per 8/25 pof. Almira Coaster called me to get appt added. Per Almira Coaster pt is being d/c'd now and will be given appt by BS.

## 2012-10-09 NOTE — Discharge Summary (Signed)
Physician Discharge Summary  Patient ID: Jeffery [name] MRN: [id number] DOB/AGE: [birth year] 52 y.o.  Admit date: 09/28/2012 Discharge date: 10/09/2012  Admitting Diagnosis: Metastatic rectal cancer Small bowel obstruction Nausea and vomiting  Discharge Diagnosis Patient Active Problem List   Diagnosis Date Noted  . Protein-calorie malnutrition, severe 10/04/2012  . HTN (hypertension), benign 10/02/2012  . 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 Abscess of male pelvis 11/19/2011  . Protein calorie malnutrition 11/17/2011  . Septic shock(785.52) 11/11/2011  . Severe sepsis(995.92) 11/11/2011  . Acute renal insufficiency 11/11/2011  . Acute respiratory failure following trauma and surgery 11/10/2011  . Peritonitis 11/10/2011  . Aspiration pneumonia 11/10/2011  . Hypotension 11/10/2011  . SBO (small bowel obstruction), closed loop - s/p SB resection 11/10/2011 10/12/2011  . Tobacco abuse 10/12/2011  . 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 Hypertension   . ADENOCARCINOMA, RECTUM, [id number] 09/19/2009    Consultants Dr. Truett Perna (Oncology) Dr. Phillips Odor (Palliative care)  Imaging: No results found.  Procedures Dr. Luisa Hart (10/04/12) - Exploratory laparotomy and intestinal bypass of closed loop obstruction to the transverse colon, placement of a 24-french Stamm gastrostomy tube.  Hospital Course:  52 year old AAM with a history of stage III rectal cancer s/p pelvic radiation, laparoscopic lower anterior resection with a diverting loop ileostomy 12/2009,  Metastatic lung cancer diagnosed in 02/2011, strangulated peristomal hernia and small bowel obstruction s/p reduction of hernia, omentectomy, exploratory laparotomy, small bowel resection in 01/2010, left adrenal nodule, failure to thrive, HTN, diverting loop colostomy 09/2011. He was hospitalized 09/2011 for SBO which resolved with  conservative management, which reoccurred in September of 2013 and subsequently underwent an exploratory laparotomy with SBR x2 and incidental appendectomy. He presented today to [facility] with abdominal pain and nausea which began suddenly yesterday after eating french fries. Modifying factors include; self inflicted vomiting which did not help. He reports emptying colostomy bag yesterday and letting the air out. He has not had any gas or stool in ostomy bag since. He denies fever, chills. His appetite is fair, he has lost 10lbs and is seeing dietician to help with weight loss following radiation. He denies melena or hematochezia. Denies dysuria. At present time, he has a NGT set to low intermittent suction, he continues to have abdominal pain, but is a little better. He takes chronic pain medication for back pain and chronic pain secondary to cancer.   Workup showed a high grade bowel obstruction likely secondary to metastatic disease.  Patient was admitted for observation and a trial of conservative management.  However, he continued to have symptoms on and off, thus surgical intervention was suggested.  He underwent the above procedure.  Dr. Luisa Hart noted a closed loop obstruction of the distal ileum secondary to frozen pelvis, secondary to carcinamotosis.  Tolerated procedure well and was transferred to the floor.  Diet was advanced as tolerated.  On POD #5, the patient was voiding well, tolerating diet, ambulating well, pain well controlled, vital signs stable, incisions c/d/i and felt stable for discharge home.  Patient will follow up in our office with Dr. Luisa Hart or Dr. [name] in 2-3 weeks and knows to call with questions or concerns.  The patient had originally agreed to hospice care at discharge, but he currently wants to wait until he really needs them.  He will be discharged with [facility] nursing to help manage post surgical wound, G tube, and medication management.   Physical  Exam: General:  Alert, NAD,  pleasant, comfortable Abd:  Soft, ND, mild tenderness, midline abdominal wound clean, G-tube currently to gravity, ostomy pink with yellowish brown output     Medication List         acetaminophen 325 MG tablet  Commonly known as:  TYLENOL  Take 1-2 tablets (325-650 mg total) by mouth every 6 (six) hours as needed for pain or fever.     amLODipine 10 MG tablet  Commonly known as:  NORVASC  Take 1 tablet (10 mg total) by mouth daily.     clonazePAM 0.5 MG disintegrating tablet  Commonly known as:  KLONOPIN  Take 1 tablet (0.5 mg total) by mouth 2 (two) times daily as needed (anxiety).     diphenoxylate-atropine 2.5-0.025 MG per tablet  Commonly known as:  LOMOTIL  Take 1 tablet by mouth 4 (four) times daily as needed for diarrhea or loose stools.     feeding supplement Liqd  Take 237 mLs by mouth 2 (two) times daily between meals.     lidocaine-prilocaine cream  Commonly known as:  EMLA  Apply topically as needed. Apply to [facility] site 1-2 hours prior to stick and cover with plastic wrap     morphine 60 MG 12 hr tablet  Commonly known as:  MS CONTIN  Take 1 tablet (60 mg total) by mouth every 8 (eight) hours. Not to be filled until the last week of August. Can take every 12 hrs if pain improves.     ondansetron 4 MG disintegrating tablet  Commonly known as:  ZOFRAN-ODT  Take 1-2 tablets (4-8 mg total) by mouth every 8 (eight) hours as needed.     Oxycodone HCl 10 MG Tabs  Take 1 tablet (10 mg total) by mouth every 6 (six) hours as needed.     oxyCODONE-acetaminophen 10-325 MG per tablet  Commonly known as:  PERCOCET  Take 1 tablet by mouth every 6 (six) hours as needed for pain.         Follow-up Information   Schedule an appointment as soon as possible for a visit with Almond Lint, MD. (can schedule an appointment with Dr. [name] or Dr. Luisa Hart)    Specialty:  General Surgery   Contact information:   [address] [place]  [address] [phone]       Schedule an appointment as soon as possible for a visit with Dortha Schwalbe., MD.   Specialty:  General Surgery   Contact information:   [address] [place] [address] [phone]       Schedule an appointment as soon as possible for a visit with Geraldo Pitter, MD.   Specialty:  Family Medicine   Contact information:   [address] [place] [address] [phone]       Follow up with Thornton Papas, MD.   Specialty:  Oncology   Contact information:   [address] [place] [address] [phone]       Signed: Candiss Norse [facility] [phone]  10/09/2012, 8:52 AM

## 2012-10-09 NOTE — Addendum Note (Signed)
Addended by: Wandalee Ferdinand on: 10/09/2012 12:07 PM   Modules accepted: Orders, Medications

## 2012-10-10 ENCOUNTER — Other Ambulatory Visit: Payer: Self-pay | Admitting: Nurse Practitioner

## 2012-10-10 ENCOUNTER — Telehealth (INDEPENDENT_AMBULATORY_CARE_PROVIDER_SITE_OTHER): Payer: Self-pay

## 2012-10-10 NOTE — Telephone Encounter (Signed)
Jeffery [name] with [facility] called stating she received referral for [name] wd care. She states there is not documented order on how wd care to be done. She did saline moist wet to dry dsg today but requests to be called with order. She also requests social worker order to assist pt with other issues. I advised her I will send request to Dr Luisa Hart and his assistant. Jeffery [name] can be reached at [phone].

## 2012-10-10 NOTE — Telephone Encounter (Signed)
Call PA at [facility] who D/C pt and get orders.  Pt is a hospice pt as far as I can tell.

## 2012-10-10 NOTE — Telephone Encounter (Signed)
I called Robbin back and let her know Dr Luisa Hart ok with Child psychotherapist. Wound care is wet to dry BID to midline wound.

## 2012-10-10 NOTE — Telephone Encounter (Signed)
Jeffery [name], can you help out? What dressing changes are [facility]N suppose to be doing for this patient?

## 2012-10-11 ENCOUNTER — Telehealth: Payer: Self-pay | Admitting: *Deleted

## 2012-10-11 NOTE — Telephone Encounter (Signed)
Pt came to clinic and requested prescription for Percocet and f/u appt with Dr. Truett Perna.  Spoke with pt in the lobby, and was informed that pt did fill rx for Oxycodone 10 mg  # 50  Given to pt on 10/09/12 prior to pt being discharged home from hospital.  Pt stated he usually gets rx for  Percocet  # 100.  Pt stated he usually takes one Percocet tablet every 8 hrs ATC for breakthrough pain in addidtion to Morphine. Spoke with Darl Pikes, Systems analyst .  Per Darl Pikes, pt needs to finish Oxycodone as instructed.  Pt needs to contact office at least 24 hrs in advance for further pain meds refill.  Spoke with pt again, and instructed pt to take Tylenol with Oxycodone to help with breakthrough pain as per desk nurse.  Gave pt appt calendar for f/u appt with Misty Stanley, NP on 10/20/12.  Pt and wife verbalized understanding.

## 2012-10-20 ENCOUNTER — Ambulatory Visit (HOSPITAL_BASED_OUTPATIENT_CLINIC_OR_DEPARTMENT_OTHER): Payer: Medicare Other | Admitting: Nurse Practitioner

## 2012-10-20 ENCOUNTER — Encounter: Payer: Self-pay | Admitting: Nutrition

## 2012-10-20 ENCOUNTER — Telehealth: Payer: Self-pay | Admitting: Oncology

## 2012-10-20 VITALS — BP 144/95 | HR 116 | Temp 98.1°F | Resp 19 | Ht 64.0 in | Wt 110.2 lb

## 2012-10-20 DIAGNOSIS — R229 Localized swelling, mass and lump, unspecified: Secondary | ICD-10-CM

## 2012-10-20 DIAGNOSIS — C7800 Secondary malignant neoplasm of unspecified lung: Secondary | ICD-10-CM

## 2012-10-20 DIAGNOSIS — M549 Dorsalgia, unspecified: Secondary | ICD-10-CM

## 2012-10-20 DIAGNOSIS — C20 Malignant neoplasm of rectum: Secondary | ICD-10-CM

## 2012-10-20 DIAGNOSIS — I10 Essential (primary) hypertension: Secondary | ICD-10-CM

## 2012-10-20 MED ORDER — DIPHENOXYLATE-ATROPINE 2.5-0.025 MG PO TABS: 1.0000 | ORAL_TABLET | Freq: Four times a day (QID) | ORAL | Status: DC | PRN

## 2012-10-20 MED ORDER — AMLODIPINE BESYLATE 10 MG PO TABS: 10.0000 mg | ORAL_TABLET | Freq: Every day | ORAL | Status: DC

## 2012-10-20 MED ORDER — OXYCODONE-ACETAMINOPHEN 5-325 MG PO TABS: 1.0000 | ORAL_TABLET | ORAL | Status: DC | PRN

## 2012-10-20 NOTE — Progress Notes (Addendum)
OFFICE PROGRESS NOTE  Interval history:  [name] was hospitalized 09/28/2012 with a high-grade bowel obstruction. Conservative management was initially attempted. He continued to have symptoms. On 10/04/2012 he underwent an exploratory laparotomy and intestinal bypass of closed loop obstruction to the transverse colon and placement of a gastrostomy tube. 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 Hospice was recommended. [name] was initially agreeable to a hospice referral. At discharge he declined a referral.  He is seen today for scheduled followup. He reports some improvement in his strength and appetite. He notes diarrhea after eating "fatty foods". The gastrostomy tube is open to drain. He reports loose stools via the colostomy. No nausea or vomiting. He continues to have pain at the back and right lower quadrant. He is currently on MS Contin with oxycodone and Tylenol as needed. He would like to change breakthrough pain medication back to Percocet. He notes a painful "lump" at the right buttock.   Objective: Blood pressure 144/95, pulse 116, temperature 98.1 F (36.7 C), temperature source Oral, resp. rate 19, height 5\' 4"  (1.626 m), weight 110 lb 3.2 oz (49.986 kg).  No thrush. Lungs are clear. Regular cardiac rhythm. Port-A-Cath site without erythema. Midline incision is healing. Edges appear clean. Gastrostomy tube site is without erythema or drainage. He has a colostomy. Tender at the right lower quadrant. No leg edema. 2-3 cm tender subcutaneous mass at the right buttock.  Lab Results: Lab Results  Component Value Date   WBC 6.8 10/03/2012   HGB 10.9* 10/03/2012   HCT 33.0* 10/03/2012   MCV 92.4 10/03/2012   PLT 340 10/03/2012    Chemistry:    Chemistry      Component Value Date/Time   NA 129* 10/06/2012 0510   NA 140 08/22/2012 1152   K 4.2 10/06/2012 0510   K 3.8 08/22/2012 1152   CL 96 10/06/2012 0510   CL 104  07/26/2012 0744   CO2 28 10/06/2012 0510   CO2 29 08/22/2012 1152   BUN 17 10/06/2012 0510   BUN 15.3 08/22/2012 1152   CREATININE 0.87 10/06/2012 0510   CREATININE 1.1 08/22/2012 1152      Component Value Date/Time   CALCIUM 8.3* 10/06/2012 0510   CALCIUM 9.4 08/22/2012 1152   ALKPHOS 45 10/05/2012 0535   ALKPHOS 62 08/22/2012 1152   AST 17 10/05/2012 0535   AST 16 08/22/2012 1152   ALT 6 10/05/2012 0535   ALT 12 08/22/2012 1152   BILITOT 0.6 10/05/2012 0535   BILITOT 0.60 08/22/2012 1152       Studies/Results: Dg Abd 1 View  09/30/2012   *RADIOLOGY REPORT*  Clinical Data: Follow-up small bowel obstruction.  ABDOMEN - 1 VIEW  Comparison: 09/29/2012  Findings: There is marked dilation of multiple loops of small bowel, similar to the prior exam.  The some residual contrast is noted in the right lower quadrant, which may reside in the right colon or dilated loops small bowel, the former is favored.  There is no gross free air on the supine exam.  An enteric tube curls within the stomach.  Mild atelectasis is noted at the lung bases.  IMPRESSION: Persistent high-grade small bowel obstruction.   Original Report Authenticated By: Amie Portland, M.D.   Dg Abd 1 View  09/29/2012   *RADIOLOGY REPORT*  Clinical Data: Follow up bowel obstruction.  Current history of metastatic rectal carcinoma.  ABDOMEN - 1 VIEW  Comparison: Acute abdomen series  and CT abdomen and pelvis yesterday.  Findings: Chronic massively distended loop of small bowel in the upper abdomen is slightly less distended than on the examinations yesterday.  Remaining dilated small bowel loops have also slightly decreased in caliber since yesterday.  Oral contrast material given at the time of yesterday's CT has progressed into the stool-filled cecum.  The colon remains decompressed throughout.  There is no suggestion of free intraperitoneal air on the supine image. Surgical and anastomotic suture material is again noted low in the pelvis.  Phleboliths are  present both sides of the pelvis.  Mild degenerative changes are again noted involving the lumbar spine.  IMPRESSION: Slight improvement since yesterday in the chronic small bowel obstruction as the small bowel is slightly less distended, including the chronic massively distended loop of small bowel in the upper abdomen.   Original Report Authenticated By: Hulan Saas, M.D.   Ct Abdomen Pelvis W Contrast  09/28/2012   *RADIOLOGY REPORT*  Clinical Data: Obstruction versus tumor burden.  CT ABDOMEN AND PELVIS WITH CONTRAST  Technique:  Multidetector CT imaging of the abdomen and pelvis was performed following the standard protocol during bolus administration of intravenous contrast.  Contrast: 50mL OMNIPAQUE IOHEXOL 300 MG/ML  SOLN, OMNIPAQUE IOHEXOL 300 MG/ML  SOLN  Comparison: 08/03/2012.  Findings:  BODY WALL: Linear, partly calcified soft tissue mass in the right lower gluteal region, a prior catheter entry site, measures 3.2 cm in length by 1.2 cm in diameter.  This is increased from prior when it measured 8 mm in diameter.  This extends towards a newly necrotic or ulcerated rectal mass.  LOWER CHEST:  Lungs/pleura: There are innumerable pulmonary nodules in the lower lungs compatible with metastatic disease.  There has been marginal increase in size, for example bilobed nodule in the medial basal segment left lower lobe measures 21 x 15 mm compared to 20 x 13 mm August 22, 2012.  Improved aeration of the right lower lobe. Whereas it was previously collapsed, now there is only segmental atelectasis medially.  ABDOMEN/PELVIS:  Liver: No focal abnormality.  Biliary: No evidence of biliary obstruction or stone.  Pancreas: Unremarkable.  Spleen: Surface mass as below.  Adrenals: Unremarkable.  Kidneys and ureters: New, moderate left hydroureteronephrosis of the lower pole moiety to the level of the pelvic inlet.  There is delayed renal enhancement related to obstruction.  Simple cyst in the anterior hilar  lip on the right.  Bladder: Unremarkable.  Bowel: There is a diverting loop colostomy in the left lower quadrant.  Anastomotic sutures also present in the right lower quadrant.  There is progressive dilatation of small bowel, with air- fluid levels, to the level of these sutures.  The colon is relatively decompressed beyond this point.  The cause of the obstruction could be peritoneal metastatic disease, although discrete mass not identified. Anastomotic stricturing is an alternate possibility.  Marked circumferential rectal thickening, with the necrotic appearing mass along the right wall larger at 3 x 3.4 cm compared to 3 x 2.8 cm.  This is in continuity with the above described right gluteal mass.  Peritoneum: No evidence of pneumoperitoneum.  The mass deforming the left upper spleen, likely peroneal location, has modestly enlarged, 4.7 x 2.8 transaxial dimension compared to 4.2 x 2.3 cm previously. Although this was a site of prior fluid collection, this solid enhancing nodule has been gradually enlarging, more compatible with tumor. There is growth medially towards the greater curvature of the stomach.  Reproductive: Indistinct margins  between the thickened rectum and the prostate/seminal vesicles.  Vascular: No acute abnormality.  OSSEOUS: No acute abnormalities. No suspicious lytic or blastic lesions.  IMPRESSION:  1.  Chronic, now high-grade, distal small bowel obstruction with transition point at bowel anastomosis in the right lower quadrant. This is likely from malignant stricturing, such as with a peritoneal or mesenteric metastasis.  2.  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  This is primarily concerning for a malignant obstruction, although no causative retroperitoneal mass identified.  3.  Metastatic/recurrent disease to lungs, perisplenic peritoneum, right gluteal soft tissues, and rectum.  Tumors have modestly  increased in size since 08/03/2012 comparison.   Original Report Authenticated By: Tiburcio Pea   Dg Abd 2 Views  10/02/2012   *RADIOLOGY REPORT*  Clinical Data: Abdominal pain.  Evaluate bowel dilatation.  ABDOMEN - 2 VIEW  Comparison: 09/30/2012  Findings: The enteric tube is looped within the proximal stomach. Multiple abnormally dilated loops of small bowel are again identified.  Massively distended loop of small bowel within the central abdomen is not significantly changed compared with study from 09/29/2012.  On the upright images there is a fluid level within this dilated loop of bowel. Enteric contrast material within the proximal right colon is again noted and appears unchanged in location from previous exam.  No free intraperitoneal air identified.  IMPRESSION:  1. No significant change in high-grade small bowel obstruction.   Original Report Authenticated By: Signa Kell, M.D.   Dg Abd Acute W/chest  09/28/2012   *RADIOLOGY REPORT*  Clinical Data: Rectal cancer.  Abdominal distension and abdominal pain.  ACUTE ABDOMEN SERIES (ABDOMEN 2 VIEW & CHEST 1 VIEW)  Comparison: CT of the abdomen and pelvis 08/03/2012.  Findings: Multiple pulmonary nodules are again noted throughout the lungs bilaterally, with the largest nodule measuring up to 2.9 cm in diameter in the inferior aspect of the right upper lobe immediately above the minor fissure.  No acute consolidative airspace disease.  No pleural effusions.  No evidence of pulmonary edema.  Heart size is within normal limits.  Mediastinal contours are unremarkable.  Right subclavian single lumen power Port-A-Cath with tip terminating in the right atrium.  Supine and upright views of the abdomen demonstrate diffuse gaseous distension of both the small bowel and colon, with small bowel dilatation up to 5.1 cm in diameter.  Multiple air fluid levels are noted within the colon and small bowel on the upright views.  No definite pneumoperitoneum.   Sutures  are seen projecting over the lower pelvis.  IMPRESSION: 1.  Abnormal bowel gas pattern, as above.  Findings are concerning for potential distal obstruction, likely at the level of the rectum. 2.  No definite pneumoperitoneum at this time. 3.  Widespread metastatic disease to the lungs redemonstrated.   Original Report Authenticated By: Trudie Reed, M.D.    Medications: I have reviewed the patient's current medications.  Assessment/Plan:  1. Clinical stage III (uT3 uN1) rectal cancer status post pelvic radiation, August 24 through November 18, 2009. 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 There was no lymph node involvement (pT3 pN0). 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 He began adjuvant CAPOX chemotherapy March 11, 2010. He completed the sixth and final cycle of CAPOX 06/25/2010. CT  of the abdomen and pelvis 03/11/2011 confirmed multiple nodules at the lung bases consistent with metastatic disease. 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 He completed cycle 1 of FOLFIRI/Avastin 04/29/2011. He completed cycle 3 05/27/2011. CEA was improved on 06/10/2011. He completed cycle 4 on 06/10/2011. He completed cycle 5 on 06/24/2011. He completed cycle 6 on 07/08/2011. Restaging CT evaluation 07/22/2011 showed a slight decrease in pulmonary nodules. He completed cycle 8 FOLFIRI/Avastin on 08/12/2011. He completed cycle 9 on 09/02/2011. Chest CT on 10/07/2011 showed increased size and number of lung nodules. CT scans 12/13/2011 with mild progression of pulmonary metastatic disease; interval resolution of left upper quadrant and central pelvic fluid collections; residual gas and fluid  collection in the undersurface of the left lower quadrant ventral abdominal wall; similar appearance of soft tissue thickening and enhancement to the perirectal region; abnormal small bowel dilatation and moderate colonic stool burden consistent with ileus and/or constipation. He completed cycle 1 FOLFOXIRI on 01/12/2012; cycle 4 on 02/24/2012.The CEA was slightly lower on 02/24/2012. Restaging CT on 03/09/2012 without evidence of disease progression. He completed cycle 5 on 03/28/2012. The CEA was more elevated on 04/10/2012 (22.9 ). He completed cycle 6 FOLFOXIRI on 04/12/2012 ,cycle 7 on 04/26/2012, and cycle 8 on 05/10/2012. CEA was improved on 05/10/2012. Restaging CT 05/19/2012 revealed stable lung nodules and stable postoperative changes in the abdomen/pelvis, stable CEA 06/14/2012. CEA stable on 06/28/2012. He completed cycle 12 FOLFOXIRI on 07/26/2012. -Restaging CT 08/03/2012 with enlargement of some of the lung nodules, increased perirectal mass, increased size of a splenic lesion. Irinotecan and oxaliplatin were discontinued. Systemic therapy continued with 5-FU/leucovorin.  -CT 08/22/2012 showed interval progression of central right lower lobe tumor involving the right lower lobe bronchus and mild increase in a splenic lesion. Status post palliative radiation to both areas 08/31/2012 through 09/20/2012.  2. Strangulated peristomal hernia and small bowel obstruction status post reduction of peristomal hernia, omentectomy, exploratory laparotomy, small bowel resection x2 on January 28, 2010.  3. History of rectal bleeding secondary to number 1.  4. History of an elevated CEA. Improved on 06/10/2011 and 07/29/2011. 5. History of a left adrenal nodule.  6. Weight loss. Improved. 7. Right knee pain/effusion status post evaluation at [facility].  8. CT abdomen/pelvis 03/11/2011 with multiple nodules at the lung bases consistent with metastatic involvement of the lungs; prominent  presacral soft tissue; air bubble within the presacral soft tissue appeared to be extraluminal and breakdown at the anastomotic site could not be excluded. No discrete abscess was seen. 9. Elevated blood pressure. Question effect of Avastin. He was started on Norvasc on 06/10/2011.  10. Frequent bowel movements. Likely related to multiple bowel resections. Imodium was not effective. He is taking Lomotil. 11. Suture at the ileostomy reversal scar. Suture removed during the recent hospitalization. 12. Erectile dysfunction. The testosterone level returned at 508 on 08/12/2011, he was referred to urology, but Medicaid would not pay for this visit. 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 Culture returned positive for abundant Escherichia coli. He completed a course of Augmentin.  14. Status post diverting loop descending colostomy 10/05/2011. 15. Status post multiple rectal biopsies 10/05/2011, all negative for malignancy. 16. Hospitalization 10/13/2011 with a bowel obstruction. Resolved. 17. Hospitalization 11/07/2011 with a recurrent small bowel obstruction. He was taken to the OR on 11/10/2011 and underwent an exploratory laparotomy with small bowel resection x2 and incidental appendectomy. He was found to  have a closed loop obstruction. Pathology on #1 small bowel resection showed focal perforation with no evidence of malignancy; pathology on #2 small bowel resection showed metastatic adenocarcinoma consistent with colorectal primary; pathology on the appendix showed serosal fibrous adhesions with no evidence of malignancy.He developed postoperative respiratory failure, sepsis and renal insufficiency. He improved gradually and was able to be extubated. He required placement of multiple percutaneous drains for intraperitoneal abscesses. He was discharged home on 12/02/2011. No remaining abscess on the CT 03/09/2012. The pelvic drain has been  removed. 18. Deconditioning. Improved. 19. Low back and testicular pain when supine, tenderness of the right testicle. Improved. He did not complain of this pain at today's visit. 20. Diarrhea following cycle 1 FOLFOXIRI. 21. Pain at previous pelvic drain site, ileostomy scar and the lower aspect of the midline scar. He takes Percocet as needed. ? neuropathic pain. 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 23. Acute onset pleuritic chest pain 08/22/2012. The pain is likely related to a pleural-based versus a splenic mass. He completed a course of palliative radiation 08/31/2012 through 09/20/2012. The pain is better.  24. Hospitalization 09/28/2012 through 10/09/2012 with bowel obstruction status post exploratory laparotomy and intestinal bypass of closed loop obstruction to the transverse colon, placement of a gastrostomy tube. 25. Painful subcutaneous mass right buttock.  Disposition-[name]'s performance status is slowly declining. He is not a candidate for additional chemotherapy. We again discussed a hospice referral at today's visit. He prefers to hold on this for now.  He has a painful subcutaneous mass at the right buttock. We made a referral to Dr. Basilio Cairo for consideration of palliative radiation therapy.  We will continue to follow on a supportive care approach. He will return for a followup visit in 3-4 weeks. He requested refills on Lomotil, Norvasc and Percocet. All were provided to him at today's visit.  Patient seen with Dr. Truett Perna.  Lonna Cobb ANP/GNP-BC    This was a shared visit with Lonna Cobb.  [name] has recovered from the recent palliative surgery. He is tolerating a diet. He continues to have pain in the abdomen and at the right buttock. There is a firm mass at the right buttock. He would like to consider a trial of palliative radiation. We will refer him to Dr. Basilio Cairo. He is not a candidate for further systemic therapy at  present.  Mancel Bale, M.D.

## 2012-10-20 NOTE — Progress Notes (Signed)
Patient is requesting additional samples of Ensure Plus.  I have provided him with both strawberry Ensure Plus and vanilla Ensure complete at his request.  Patient reports diarrhea continues to come and go.  Patient encouraged to continue, low fiber diet with plenty of fluids.  Teach back method used.

## 2012-10-20 NOTE — Telephone Encounter (Signed)
Gave pt appt for lab and MD on 10/2 pt altrady has appt with Dr. Basilio Cairo

## 2012-10-23 NOTE — Progress Notes (Signed)
Histology and Location of Primary Skin Cancer: subcutaneous mass right buttock  Patient presented with the following signs/symptoms,   SBO ,09/28/12, had surgery 10/04/12  Past/Anticipated interventions by patient's surgeon/dermatologist for current problematic lesion, if any: had surgery 10/04/12 had intestinal bypass closed loop obstruction to distal ileum 2ndary to frozen pelvis ,2ndary to cancer, ,had gastrostomy  Tube placed as well, colostiomy  Past skin cancers, if any:  1) Location/Histology/Intervention: painful lump right buttocks   :  History of Blistering sunburns, if any: no  SAFETY ISSUES:  Prior radiation? Yes,  Pelvis/rectum, 50.4Gy/81fx, 2011,  Also right lung 30Gy, completed  09/20/12  Pacemaker/ICD? no  Is the patient on methotrexate? no  Current Complaints / other details:  SBO,  Multiple bowel resections,, Hospice referral discussed,patient prefers to hold off for now,  Here for consideration palliative radiation,

## 2012-10-24 ENCOUNTER — Ambulatory Visit: Payer: Medicare Other

## 2012-10-24 ENCOUNTER — Ambulatory Visit
Admission: RE | Admit: 2012-10-24 | Discharge: 2012-10-24 | Disposition: A | Discharge status: Home / Self Care | Payer: Medicare Other | Source: Ambulatory Visit | Attending: Radiation Oncology | Admitting: Radiation Oncology

## 2012-10-24 DIAGNOSIS — R229 Localized swelling, mass and lump, unspecified: Secondary | ICD-10-CM

## 2012-10-26 NOTE — Progress Notes (Addendum)
GI Location of Tumor / Histology: peritoneum, metastatic colorectal adenocarcinoma  Patient presented  months ago with symptoms of: painful lump at right buttock, 2-3 cm tender subcutaneous mass  Biopsies of peritoneum (if applicable) revealed:  10/04/12 Peritoneum, biopsy - ADENOCARCINOMA. Microscopic Comment The morphologic features are consistent with metastatic colorectal adenocarcinoma. (JDP:gt, 10/05/12)  Past/Anticipated interventions by surgeon, if any: none for the right buttock mass  Past/Anticipated interventions by medical oncology, if any: hx of mult chemo regimes, supportive care at this time, not candidate for additional chemotherapy  Weight changes, if any: lost 11 lbs past 3 weeks  Bowel/Bladder complaints, if any: colostomy, diarrhea after eating "fatty foods"  Nausea / Vomiting, if any: none  Pain issues, if any:  Right buttock, 6-7 /10. Takes Morphine, Percocet prn.  SAFETY ISSUES:  Prior radiation? Yes, 2011 posterior pelvis 4500 cGy, 25 fx posterior pelvis boost 540 cGy, 3 fx. 09/2012 right lung 3000 cGy 10 fx  Pacemaker/ICD? no  Possible current pregnancy? na  Is the patient on methotrexate? no  Current Complaints / other details:  Mr. Novosad was hospitalized 09/28/2012 with a high-grade bowel obstruction. Conservative management was initially attempted. He continued to have symptoms. On 10/04/2012 he underwent an exploratory laparotomy and intestinal bypass of closed loop obstruction to the transverse colon and placement of a gastrostomy tube. 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 Hospice was recommended. Mr. Manville was initially agreeable to a hospice referral. At discharge he declined a referral. Pt fatigued, c/o loss of appetite, diarrhea. He is prescribed Lomotil but states "it doesn't work". Pt states he is not taking Lomotil regularly but states he is not currently having diarrhea. Pt has  gastrostomy tube to drainage w/brown liquid in drainage bag. Colostomy bag intact. Pt denies nausea, vomiting.

## 2012-10-27 ENCOUNTER — Ambulatory Visit: Payer: Medicare Other | Admitting: Radiation Oncology

## 2012-10-27 ENCOUNTER — Ambulatory Visit
Admission: RE | Admit: 2012-10-27 | Discharge: 2012-10-27 | Disposition: A | Discharge status: Home / Self Care | Payer: Medicare Other | Source: Ambulatory Visit | Attending: Radiation Oncology | Admitting: Radiation Oncology

## 2012-10-27 ENCOUNTER — Encounter: Payer: Self-pay | Admitting: Radiation Oncology

## 2012-10-27 VITALS — BP 147/96 | HR 102 | Temp 98.0°F | Resp 20 | Wt 108.9 lb

## 2012-10-27 DIAGNOSIS — Z931 Gastrostomy status: Secondary | ICD-10-CM | POA: Insufficient documentation

## 2012-10-27 DIAGNOSIS — C20 Malignant neoplasm of rectum: Secondary | ICD-10-CM

## 2012-10-27 DIAGNOSIS — C786 Secondary malignant neoplasm of retroperitoneum and peritoneum: Secondary | ICD-10-CM | POA: Insufficient documentation

## 2012-10-27 DIAGNOSIS — Z933 Colostomy status: Secondary | ICD-10-CM | POA: Insufficient documentation

## 2012-10-27 NOTE — Addendum Note (Signed)
Encounter addended by: Maple Odaniel Mintz Marci Polito, RN on: 10/27/2012  6:04 PM<BR>     Documentation filed: Charges VN

## 2012-10-27 NOTE — Addendum Note (Signed)
Encounter addended by: Donne Baley Mintz Vashaun Osmon, RN on: 10/27/2012  6:04 PM<BR>     Documentation filed: Charges VN

## 2012-10-27 NOTE — Progress Notes (Signed)
Radiation Oncology         [phone]) [phone] ________________________________  Name: Jeffery [name] MRN: [id number]  Date: 10/27/2012  DOB: 05/26/1960  RECONSULT NOTE  Outpatient  CC: Jeffery Pitter, MD  Jeffery Artist, MD  Diagnosis and Prior Radiotherapy:   Metastatic Stage IV Rectal Cancer  In 2011, He received neoadjuvant chemoradiation -total radiotherapy dose was 50.4 Gray in 28 fractions to the pelvis (45 Gy + 5.4 Gy boost).  Radiation treatment dates: 08/31/2012-09/20/2012  Site/dose:  1) Splenic tumor / 30 Gy in 10 fractions  2) Right lower lung/bronchus / 30 Gy in 10 fractions   Narrative:  The patient returns today for reconsultation.  Since completing palliative RT in August, he was hospitalized with  high-grade bowel obstruction. Conservative management was initially attempted. He continued to have symptoms. On 10/04/2012 he underwent an exploratory laparotomy and intestinal bypass of closed loop obstruction to the transverse colon and placement of a gastrostomy tube. 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 Hospice was recommended. Jeffery [name] was initially agreeable to a hospice referral.  At discharge he declined a referral. However he has changed his mind and now desires to start hospice.  Pt fatigued, c/o loss of appetite, diarrhea. He is prescribed Lomotil but states "it doesn't work". Pt states he is not taking Lomotil regularly but states he is not currently having diarrhea.  Pt denies nausea, vomiting.           He reports pain in the Right buttock, 6-7 /10  - a firm nodule has grown there since a pelvic draining catheter was removed from that area many months ago.  ALLERGIES:  has No Known Allergies.  Meds: Current Outpatient Prescriptions  Medication Sig Dispense Refill  . acetaminophen (TYLENOL) 325 MG tablet Take 1-2 tablets (325-650 mg total) by mouth every 6 (six) hours as needed for pain or fever.   80 tablet  3  . amLODipine (NORVASC) 10 MG tablet Take 1 tablet (10 mg total) by mouth daily.  30 tablet  3  . clonazePAM (KLONOPIN) 0.5 MG disintegrating tablet Take 1 tablet (0.5 mg total) by mouth 2 (two) times daily as needed (anxiety).  30 tablet  0  . diphenoxylate-atropine (LOMOTIL) 2.5-0.025 MG per tablet Take 1 tablet by mouth 4 (four) times daily as needed for diarrhea or loose stools.  30 tablet  2  . feeding supplement (ENSURE COMPLETE) LIQD Take 237 mLs by mouth 2 (two) times daily between meals.  30 Bottle  0  . lidocaine-prilocaine (EMLA) cream Apply topically as needed. Apply to [facility] site 1-2 hours prior to stick and cover with plastic wrap  30 g  prn  . morphine (MS CONTIN) 60 MG 12 hr tablet Take 1 tablet (60 mg total) by mouth every 8 (eight) hours. Not to be filled until the last week of August. Can take every 12 hrs if pain improves.  90 tablet  0  . ondansetron (ZOFRAN-ODT) 4 MG disintegrating tablet Take 1-2 tablets (4-8 mg total) by mouth every 8 (eight) hours as needed.  40 tablet  0  . oxyCODONE 10 MG TABS Take 1 tablet (10 mg total) by mouth every 6 (six) hours as needed.  50 tablet  0  . oxyCODONE-acetaminophen (PERCOCET/ROXICET) 5-325 MG per tablet Take 1 tablet by mouth every 4 (four) hours as needed for pain.  100 tablet  0   No current facility-administered medications for this encounter.  Physical Findings: The patient is in no acute distress. Patient is alert and oriented.  weight is 108 lb 14.4 oz (49.397 kg). His temperature is 98 F (36.7 C). His blood pressure is 147/96 and his pulse is 102. His respiration is 20. .  Firm subcutaneous nodule, approx 2cm, in medial right buttock. No skin ulceration, warmth, or fluctuance or erythema. Pt has gastrostomy tube to drainage w/brown liquid in drainage bag. Colostomy bag intact.  Lab Findings: Lab Results  Component Value Date   WBC 6.8 10/03/2012   HGB 10.9* 10/03/2012   HCT 33.0* 10/03/2012   MCV 92.4 10/03/2012    PLT 340 10/03/2012    Radiographic Findings: Dg Abd 1 View  09/30/2012   *RADIOLOGY REPORT*  Clinical Data: Follow-up small bowel obstruction.  ABDOMEN - 1 VIEW  Comparison: 09/29/2012  Findings: There is marked dilation of multiple loops of small bowel, similar to the prior exam.  The some residual contrast is noted in the right lower quadrant, which may reside in the right colon or dilated loops small bowel, the former is favored.  There is no gross free air on the supine exam.  An enteric tube curls within the stomach.  Mild atelectasis is noted at the lung bases.  IMPRESSION: Persistent high-grade small bowel obstruction.   Original Report Authenticated By: Jeffery [name], M.D.   Dg Abd 1 View  09/29/2012   *RADIOLOGY REPORT*  Clinical Data: Follow up bowel obstruction.  Current history of metastatic rectal carcinoma.  ABDOMEN - 1 VIEW  Comparison: Acute abdomen series and CT abdomen and pelvis yesterday.  Findings: Chronic massively distended loop of small bowel in the upper abdomen is slightly less distended than on the examinations yesterday.  Remaining dilated small bowel loops have also slightly decreased in caliber since yesterday.  Oral contrast material given at the time of yesterday's CT has progressed into the stool-filled cecum.  The colon remains decompressed throughout.  There is no suggestion of free intraperitoneal air on the supine image. Surgical and anastomotic suture material is again noted low in the pelvis.  Phleboliths are present both sides of the pelvis.  Mild degenerative changes are again noted involving the lumbar spine.  IMPRESSION: Slight improvement since yesterday in the chronic small bowel obstruction as the small bowel is slightly less distended, including the chronic massively distended loop of small bowel in the upper abdomen.   Original Report Authenticated By: Jeffery [name], M.D.   Ct Abdomen Pelvis W Contrast  09/28/2012   *RADIOLOGY REPORT*  Clinical Data:  Obstruction versus tumor burden.  CT ABDOMEN AND PELVIS WITH CONTRAST  Technique:  Multidetector CT imaging of the abdomen and pelvis was performed following the standard protocol during bolus administration of intravenous contrast.  Contrast: 50mL OMNIPAQUE IOHEXOL 300 MG/ML  SOLN, OMNIPAQUE IOHEXOL 300 MG/ML  SOLN  Comparison: 08/03/2012.  Findings:  BODY WALL: Linear, partly calcified soft tissue mass in the right lower gluteal region, a prior catheter entry site, measures 3.2 cm in length by 1.2 cm in diameter.  This is increased from prior when it measured 8 mm in diameter.  This extends towards a newly necrotic or ulcerated rectal mass.  LOWER CHEST:  Lungs/pleura: There are innumerable pulmonary nodules in the lower lungs compatible with metastatic disease.  There has been marginal increase in size, for example bilobed nodule in the medial basal segment left lower lobe measures 21 x 15 mm compared to 20 x 13 mm August 22, 2012.  Improved aeration  of the right lower lobe. Whereas it was previously collapsed, now there is only segmental atelectasis medially.  ABDOMEN/PELVIS:  Liver: No focal abnormality.  Biliary: No evidence of biliary obstruction or stone.  Pancreas: Unremarkable.  Spleen: Surface mass as below.  Adrenals: Unremarkable.  Kidneys and ureters: New, moderate left hydroureteronephrosis of the lower pole moiety to the level of the pelvic inlet.  There is delayed renal enhancement related to obstruction.  Simple cyst in the anterior hilar lip on the right.  Bladder: Unremarkable.  Bowel: There is a diverting loop colostomy in the left lower quadrant.  Anastomotic sutures also present in the right lower quadrant.  There is progressive dilatation of small bowel, with air- fluid levels, to the level of these sutures.  The colon is relatively decompressed beyond this point.  The cause of the obstruction could be peritoneal metastatic disease, although discrete mass not identified. Anastomotic  stricturing is an alternate possibility.  Marked circumferential rectal thickening, with the necrotic appearing mass along the right wall larger at 3 x 3.4 cm compared to 3 x 2.8 cm.  This is in continuity with the above described right gluteal mass.  Peritoneum: No evidence of pneumoperitoneum.  The mass deforming the left upper spleen, likely peroneal location, has modestly enlarged, 4.7 x 2.8 transaxial dimension compared to 4.2 x 2.3 cm previously. Although this was a site of prior fluid collection, this solid enhancing nodule has been gradually enlarging, more compatible with tumor. There is growth medially towards the greater curvature of the stomach.  Reproductive: Indistinct margins between the thickened rectum and the prostate/seminal vesicles.  Vascular: No acute abnormality.  OSSEOUS: No acute abnormalities. No suspicious lytic or blastic lesions.  IMPRESSION:  1.  Chronic, now high-grade, distal small bowel obstruction with transition point at bowel anastomosis in the right lower quadrant. This is likely from malignant stricturing, such as with a peritoneal or mesenteric metastasis.  2.  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  This is primarily concerning for a malignant obstruction, although no causative retroperitoneal mass identified.  3.  Metastatic/recurrent disease to lungs, perisplenic peritoneum, right gluteal soft tissues, and rectum.  Tumors have modestly increased in size since 08/03/2012 comparison.   Original Report Authenticated By: Tiburcio Pea   Dg Abd 2 Views  10/02/2012   *RADIOLOGY REPORT*  Clinical Data: Abdominal pain.  Evaluate bowel dilatation.  ABDOMEN - 2 VIEW  Comparison: 09/30/2012  Findings: The enteric tube is looped within the proximal stomach. Multiple abnormally dilated loops of small bowel are again identified.  Massively distended loop of small bowel within the central abdomen is not  significantly changed compared with study from 09/29/2012.  On the upright images there is a fluid level within this dilated loop of bowel. Enteric contrast material within the proximal right colon is again noted and appears unchanged in location from previous exam.  No free intraperitoneal air identified.  IMPRESSION:  1. No significant change in high-grade small bowel obstruction.   Original Report Authenticated By: Signa Kell, M.D.   Dg Abd Acute W/chest  09/28/2012   *RADIOLOGY REPORT*  Clinical Data: Rectal cancer.  Abdominal distension and abdominal pain.  ACUTE ABDOMEN SERIES (ABDOMEN 2 VIEW & CHEST 1 VIEW)  Comparison: CT of the abdomen and pelvis 08/03/2012.  Findings: Multiple pulmonary nodules are again noted throughout the lungs bilaterally, with the largest nodule measuring up to 2.9 cm in diameter in the  inferior aspect of the right upper lobe immediately above the minor fissure.  No acute consolidative airspace disease.  No pleural effusions.  No evidence of pulmonary edema.  Heart size is within normal limits.  Mediastinal contours are unremarkable.  Right subclavian single lumen power Port-A-Cath with tip terminating in the right atrium.  Supine and upright views of the abdomen demonstrate diffuse gaseous distension of both the small bowel and colon, with small bowel dilatation up to 5.1 cm in diameter.  Multiple air fluid levels are noted within the colon and small bowel on the upright views.  No definite pneumoperitoneum.   Sutures are seen projecting over the lower pelvis.  IMPRESSION: 1.  Abnormal bowel gas pattern, as above.  Findings are concerning for potential distal obstruction, likely at the level of the rectum. 2.  No definite pneumoperitoneum at this time. 3.  Widespread metastatic disease to the lungs redemonstrated.   Original Report Authenticated By: Trudie Reed, M.D.    Impression/Plan:  I spoke with Dr. Truett Perna who will get hospice in touch with Mr. Girtman. I think  hospice is ultimately in his best interest.  However, in the meantime, I think he is a good candidate for a hypofractionated course of palliative RT to the bothersome buttock nodule which is consistent with tumor.  I will simulate him Monday 9-15 and start RT 1-2 days thereafter. Anticipate a 5 fraction regimen.  Consent signed - most common side effects include potential fatigue, skin irritation.  There is risk of a rare draining fistula/ulcer. He is enthusiastic to proceed.  I spent 25 minutes face to face with the patient and more than 50% of that time was spent in counseling and/or coordination of care. _____________________________________   Lonie Peak, MD

## 2012-10-27 NOTE — Progress Notes (Signed)
Please see the Nurse Progress Note in the MD Initial Consult Encounter for this patient. 

## 2012-10-30 ENCOUNTER — Other Ambulatory Visit: Payer: Self-pay | Admitting: Nurse Practitioner

## 2012-10-30 ENCOUNTER — Ambulatory Visit: Admission: RE | Admit: 2012-10-30 | Payer: Medicare Other | Source: Ambulatory Visit | Admitting: Radiation Oncology

## 2012-10-30 MED ORDER — MORPHINE SULFATE ER 60 MG PO TBCR: 60.0000 mg | EXTENDED_RELEASE_TABLET | Freq: Three times a day (TID) | ORAL | Status: DC

## 2012-11-01 ENCOUNTER — Ambulatory Visit: Payer: Medicare Other | Admitting: Radiation Oncology

## 2012-11-02 ENCOUNTER — Ambulatory Visit: Payer: Medicare Other

## 2012-11-03 ENCOUNTER — Ambulatory Visit: Payer: Medicare Other

## 2012-11-03 ENCOUNTER — Ambulatory Visit
Admission: RE | Admit: 2012-11-03 | Discharge: 2012-11-03 | Disposition: A | Discharge status: Home / Self Care | Source: Ambulatory Visit | Attending: Radiation Oncology | Admitting: Radiation Oncology

## 2012-11-03 DIAGNOSIS — C20 Malignant neoplasm of rectum: Secondary | ICD-10-CM | POA: Insufficient documentation

## 2012-11-03 DIAGNOSIS — R229 Localized swelling, mass and lump, unspecified: Secondary | ICD-10-CM | POA: Insufficient documentation

## 2012-11-03 DIAGNOSIS — R109 Unspecified abdominal pain: Secondary | ICD-10-CM | POA: Insufficient documentation

## 2012-11-03 DIAGNOSIS — Z510 Encounter for antineoplastic radiation therapy: Secondary | ICD-10-CM | POA: Insufficient documentation

## 2012-11-03 MED ORDER — OXYCODONE-ACETAMINOPHEN 5-325 MG PO TABS: 1.0000 | ORAL_TABLET | ORAL | Status: DC | PRN

## 2012-11-03 NOTE — Addendum Note (Signed)
Encounter addended by: Caryn Gienger Mintz Trenace Coughlin, RN on: 11/03/2012 10:32 AM<BR>     Documentation filed: Charges VN

## 2012-11-03 NOTE — Progress Notes (Signed)
  Radiation Oncology         [id number]) [phone] ________________________________  Name: Jeffery [name] MRN: [id number]  Date: 11/03/2012  DOB: Feb 11, 1961  SIMULATION AND TREATMENT PLANNING NOTE  Outpatient  DIAGNOSIS:  Subcutaneous right buttock nodule from rectal cancer  NARRATIVE:  We attempted simulation earlier this week for electron treatments. The patient needed to assume the prone position but could not tolerate this due to abdominal pain. Therefore he was rescheduled later this week for CT simulation. He canceled his appointment on September 17 but was able to come in this morning. The patient was brought to the CT Simulation planning suite.  Identity was confirmed.  All relevant records and images related to the planned course of therapy were reviewed.  The patient freely provided informed written consent to proceed with treatment after reviewing the details related to the planned course of therapy. The consent form was witnessed and verified by the simulation staff.    Then, the patient was set-up in a stable reproducible  supine position for radiation therapy.  CT images were obtained.  Surface markings were placed.  The CT images were loaded into the planning software.    TREATMENT PLANNING NOTE: Treatment planning then occurred.  The radiation prescription was entered and confirmed.    A total of 3 medically necessary complex treatment devices were fabricated and supervised by me- vaclock for his legs ; 2 fields w/ MLCs to block bladder and rectum.   The patient will receive 20 Gy in 5 fractions with a wedged pair.   Special Treatment Procedure Note: The patient received prior radiotherapy close to his pelvis. There could be some overlap of radiation dose.  Prior regional radiotherapy increases the risk of side effects from treatment. I have considered this in the treatment planning process and have aimed to minimize tissue overlap.  This increases the complexity of this patient's  treatment and therefore this constitutes a special treatment procedure.   -----------------------------------  Lonie Peak, MD

## 2012-11-06 ENCOUNTER — Ambulatory Visit: Payer: Medicare Other

## 2012-11-07 ENCOUNTER — Encounter: Payer: Self-pay | Admitting: Radiation Oncology

## 2012-11-07 ENCOUNTER — Ambulatory Visit: Payer: Medicare Other

## 2012-11-07 ENCOUNTER — Ambulatory Visit
Admission: RE | Admit: 2012-11-07 | Discharge: 2012-11-07 | Disposition: A | Discharge status: Home / Self Care | Source: Ambulatory Visit | Attending: Radiation Oncology | Admitting: Radiation Oncology

## 2012-11-07 NOTE — Progress Notes (Signed)
Simulation verification note: The patient was simulation to verification for treatment to his right pelvis/buttock. His isocenter is in good position and the multileaf collimators contoured the treatment volume appropriately.

## 2012-11-08 ENCOUNTER — Ambulatory Visit
Admission: RE | Admit: 2012-11-08 | Discharge: 2012-11-08 | Disposition: A | Discharge status: Home / Self Care | Source: Ambulatory Visit | Attending: Radiation Oncology | Admitting: Radiation Oncology

## 2012-11-09 ENCOUNTER — Ambulatory Visit
Admission: RE | Admit: 2012-11-09 | Discharge: 2012-11-09 | Disposition: A | Discharge status: Home / Self Care | Source: Ambulatory Visit | Attending: Radiation Oncology | Admitting: Radiation Oncology

## 2012-11-10 ENCOUNTER — Telehealth: Payer: Self-pay | Admitting: Dietician

## 2012-11-10 ENCOUNTER — Ambulatory Visit
Admission: RE | Admit: 2012-11-10 | Discharge: 2012-11-10 | Disposition: A | Discharge status: Home / Self Care | Source: Ambulatory Visit | Attending: Radiation Oncology | Admitting: Radiation Oncology

## 2012-11-13 ENCOUNTER — Encounter: Payer: Self-pay | Admitting: Radiation Oncology

## 2012-11-13 ENCOUNTER — Ambulatory Visit
Admission: RE | Admit: 2012-11-13 | Discharge: 2012-11-13 | Disposition: A | Discharge status: Home / Self Care | Source: Ambulatory Visit | Attending: Radiation Oncology | Admitting: Radiation Oncology

## 2012-11-13 VITALS — BP 144/102 | HR 122 | Temp 98.2°F | Ht 64.0 in | Wt 104.4 lb

## 2012-11-13 DIAGNOSIS — C20 Malignant neoplasm of rectum: Secondary | ICD-10-CM

## 2012-11-13 MED ORDER — MORPHINE SULFATE ER 60 MG PO TBCR: 60.0000 mg | EXTENDED_RELEASE_TABLET | Freq: Two times a day (BID) | ORAL | Status: DC

## 2012-11-13 MED ORDER — RADIAPLEXRX EX GEL
Freq: Once | CUTANEOUS | Status: AC
  Administered 2012-11-13: 18:00:00 via TOPICAL

## 2012-11-13 NOTE — Addendum Note (Signed)
Encounter addended by: Eduardo Osier, RN on: 11/13/2012  3:48 PM<BR>     Documentation filed: Orders

## 2012-11-13 NOTE — Addendum Note (Signed)
Encounter addended by: Delynn Flavin, RN on: 11/13/2012  5:58 PM<BR>     Documentation filed: Inpatient MAR

## 2012-11-13 NOTE — Progress Notes (Signed)
   Weekly Management Note  outpatient  Completed Radiotherapy. Total Dose: 20 Gy   Narrative:  The patient presents for routine under treatment assessment on last day of radiotherapy.  CBCT/MVCT images/Port film x-rays were reviewed.  The chart was checked. No new complaints.  No skin irritation. Nodule persists.  Physical Findings:  height is 5\' 4"  (1.626 m) and weight is 104 lb 6.4 oz (47.356 kg). His temperature is 98.2 F (36.8 C). His blood pressure is 144/102 and his pulse is 122.   Nodule over right buttock is stable. Skin intact.  Impression:  The patient has tolerated radiotherapy.  Plan:  Routine follow-up in one month. Radiaplex provided to use PRN over skin. ________________________________   Lonie Peak, M.D.

## 2012-11-15 ENCOUNTER — Encounter: Payer: Self-pay | Admitting: Radiation Oncology

## 2012-11-15 NOTE — Progress Notes (Signed)
[facility] Radiation Oncology End of Treatment Note  Name:Jeffery [name]  Date: 11/15/2012 ZOX:[id number] DOB:1960-04-05   DIAGNOSIS: Metastatic Rectal Cancer    INDICATION FOR TREATMENT: Palliative   TREATMENT DATES: 9-23 to 11-13-12                          SITE/DOSE:  R buttock nodule / 20 Gy in 5 fractions                          BEAMS/ENERGY:  Wedged pair /6 MV photons                 NARRATIVE:    He tolerated RT well with no significant acute side effects.                 PLAN: Routine followup in one month. Patient instructed to call if questions or worsening complaints in interim.  -----------------------------------  Lonie Peak, MD

## 2012-11-16 ENCOUNTER — Ambulatory Visit (HOSPITAL_BASED_OUTPATIENT_CLINIC_OR_DEPARTMENT_OTHER): Payer: Medicare Other | Admitting: Oncology

## 2012-11-16 ENCOUNTER — Telehealth: Payer: Self-pay | Admitting: *Deleted

## 2012-11-16 VITALS — BP 156/103 | HR 128 | Temp 97.6°F | Resp 19 | Ht 64.0 in | Wt 101.6 lb

## 2012-11-16 DIAGNOSIS — I10 Essential (primary) hypertension: Secondary | ICD-10-CM

## 2012-11-16 DIAGNOSIS — R197 Diarrhea, unspecified: Secondary | ICD-10-CM

## 2012-11-16 DIAGNOSIS — C7800 Secondary malignant neoplasm of unspecified lung: Secondary | ICD-10-CM

## 2012-11-16 DIAGNOSIS — IMO0001 Reserved for inherently not codable concepts without codable children: Secondary | ICD-10-CM

## 2012-11-16 DIAGNOSIS — C20 Malignant neoplasm of rectum: Secondary | ICD-10-CM

## 2012-11-16 MED ORDER — LORAZEPAM 0.5 MG PO TABS: ORAL_TABLET | ORAL | Status: DC

## 2012-11-16 MED ORDER — OXYCODONE-ACETAMINOPHEN 5-325 MG PO TABS: 1.0000 | ORAL_TABLET | ORAL | Status: DC | PRN

## 2012-11-16 MED ORDER — AMLODIPINE BESYLATE 10 MG PO TABS: 10.0000 mg | ORAL_TABLET | Freq: Every day | ORAL | Status: AC

## 2012-11-16 NOTE — Telephone Encounter (Signed)
Left VM for nurse to follow up with patient regarding missing plug/clamp for his G-tube. He has stuffed gauze/kleenex and taped end because of losing the plug/clamp that came with tube.

## 2012-11-16 NOTE — Progress Notes (Signed)
[facility]    OFFICE PROGRESS NOTE   INTERVAL HISTORY:   Jeffery [name] returns for scheduled followup of rectal cancer. He completed palliative radiation to the right buttock mass on 11/13/2012. The mass has not decreased in size. He continues to have pain at the buttock and abdomen. The pain is relieved with the current medical regimen. He is tolerating a diet. He reports output from both the gastrostomy tube and colostomy.  [place] has enrolled in the [facility] hospice program. This is working out well. He reports the hospice nurse flushed the Port-A-Cath.  Objective:  Vital signs in last 24 hours:  Blood pressure 156/103, pulse 128, temperature 97.6 F (36.4 C), temperature source Oral, resp. rate 19, height 5\' 4"  (1.626 m), weight 101 lb 9.6 oz (46.085 kg).    HEENT: No thrush or ulcers Resp: Clear bilaterally, no respiratory distress Cardio: Regular rate and rhythm GI: Mildly distended, no hepatomegaly, tender throughout the right abdomen, left upper quadrant G-tube, firm mass like nodularity at the right lower abdomen Vascular: No leg edema  Musculoskeletal: 2-3 cm mass at the right upper buttock   Portacath/PICC-without erythema    Medications: I have reviewed the patient's current medications.  Assessment/Plan: 1. Clinical stage III (uT3 uN1) rectal cancer status post pelvic radiation, August 24 through November 18, 2009. 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 There was no lymph node involvement (pT3 pN0). 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 He began adjuvant CAPOX chemotherapy March 11, 2010. He completed the sixth and final cycle of CAPOX 06/25/2010. CT of the abdomen and pelvis 03/11/2011 confirmed  multiple nodules at the lung bases consistent with metastatic disease. 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 He completed cycle 1 of FOLFIRI/Avastin 04/29/2011. He completed cycle 3 05/27/2011. CEA was improved on 06/10/2011. He completed cycle 4 on 06/10/2011. He completed cycle 5 on 06/24/2011. He completed cycle 6 on 07/08/2011. Restaging CT evaluation 07/22/2011 showed a slight decrease in pulmonary nodules. He completed cycle 8 FOLFIRI/Avastin on 08/12/2011. He completed cycle 9 on 09/02/2011. Chest CT on 10/07/2011 showed increased size and number of lung nodules. CT scans 12/13/2011 with mild progression of pulmonary metastatic disease; interval resolution of left upper quadrant and central pelvic fluid collections; residual gas and fluid collection in the undersurface of the left lower quadrant ventral abdominal wall; similar appearance of soft tissue thickening and enhancement to the perirectal region; abnormal small bowel dilatation and moderate colonic stool burden consistent with ileus and/or constipation. He completed cycle 1 FOLFOXIRI on 01/12/2012; cycle 4 on 02/24/2012.The CEA was slightly lower on 02/24/2012. Restaging CT on 03/09/2012 without evidence of disease progression. He completed cycle 5 on 03/28/2012. The CEA was more elevated on 04/10/2012 (22.9 ). He completed cycle 6 FOLFOXIRI on 04/12/2012 ,cycle 7 on 04/26/2012, and cycle 8 on 05/10/2012. CEA was improved on 05/10/2012. Restaging CT 05/19/2012 revealed stable lung nodules and stable postoperative changes in the abdomen/pelvis, stable CEA 06/14/2012. CEA stable on 06/28/2012. He completed cycle 12 FOLFOXIRI on 07/26/2012. -Restaging CT 08/03/2012 with enlargement of some of the lung nodules, increased perirectal mass, increased size of a splenic lesion. Irinotecan and oxaliplatin were discontinued. Systemic therapy continued with 5-FU/leucovorin.  -CT 08/22/2012  showed interval progression of central right lower lobe tumor involving the  right lower lobe bronchus and mild increase in a splenic lesion. Status post palliative radiation to both areas 08/31/2012 through 09/20/2012.  2. Strangulated peristomal hernia and small bowel obstruction status post reduction of peristomal hernia, omentectomy, exploratory laparotomy, small bowel resection x2 on January 28, 2010.  3. History of rectal bleeding secondary to number 1.  4. History of an elevated CEA. Improved on 06/10/2011 and 07/29/2011. 5. History of a left adrenal nodule.  6. Weight loss. Improved. 7. Right knee pain/effusion status post evaluation at [facility].  8. CT abdomen/pelvis 03/11/2011 with multiple nodules at the lung bases consistent with metastatic involvement of the lungs; prominent presacral soft tissue; air bubble within the presacral soft tissue appeared to be extraluminal and breakdown at the anastomotic site could not be excluded. No discrete abscess was seen. 9. Elevated blood pressure. Question effect of Avastin. He was started on Norvasc on 06/10/2011.  10. Frequent bowel movements. Likely related to multiple bowel resections. Imodium was not effective. He is taking Lomotil. 11. Suture at the ileostomy reversal scar. Suture removed during the recent hospitalization. 12. Erectile dysfunction. The testosterone level returned at 508 on 08/12/2011, he was referred to urology, but Medicaid would not pay for this visit. 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 Culture returned positive for abundant Escherichia coli. He completed a course of Augmentin.  14. Status post diverting loop descending colostomy 10/05/2011. 15. Status post multiple rectal biopsies 10/05/2011, all negative for malignancy. 16. Hospitalization 10/13/2011 with a bowel obstruction. Resolved. 17. Hospitalization 11/07/2011 with a recurrent small bowel obstruction. He was  taken to the OR on 11/10/2011 and underwent an exploratory laparotomy with small bowel resection x2 and incidental appendectomy. He was found to have a closed loop obstruction. Pathology on #1 small bowel resection showed focal perforation with no evidence of malignancy; pathology on #2 small bowel resection showed metastatic adenocarcinoma consistent with colorectal primary; pathology on the appendix showed serosal fibrous adhesions with no evidence of malignancy.He developed postoperative respiratory failure, sepsis and renal insufficiency. He improved gradually and was able to be extubated. He required placement of multiple percutaneous drains for intraperitoneal abscesses. He was discharged home on 12/02/2011. No remaining abscess on the CT 03/09/2012. The pelvic drain has been removed. 18. Deconditioning. Improved. 19. Low back and testicular pain when supine, tenderness of the right testicle. Improved. He did not complain of this pain at today's visit. 20. Diarrhea following cycle 1 FOLFOXIRI. 21. Pain at previous pelvic drain site, ileostomy scar and the lower aspect of the midline scar. He takes Percocet as needed. ? neuropathic pain. 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 23. Acute onset pleuritic chest pain 08/22/2012. The pain is likely related to a pleural-based versus a splenic mass. He completed a course of palliative radiation 08/31/2012 through 09/20/2012. The pain is better.  24. Hospitalization 09/28/2012 through 10/09/2012 with bowel obstruction status post exploratory laparotomy and intestinal bypass of closed loop obstruction to the transverse colon, placement of a gastrostomy tube. 25. Painful subcutaneous mass right buttock,, status post palliative radiation completed 11/13/2012    Disposition:  Mr. Chuba appears stable. He will continue the current narcotic regimen. He did not take his blood pressure medication today and will resume this. He  continues close followup with the [facility] program. Mr. Koeppen will return for an office visit in one month.   Thornton Papas, MD  11/16/2012  12:36 PM

## 2012-11-17 ENCOUNTER — Telehealth: Payer: Self-pay | Admitting: Oncology

## 2012-11-17 NOTE — Telephone Encounter (Signed)
s.w. pt and advised on 10.29.14.Marland KitchenMarland Kitchenpt aware and ok

## 2012-11-22 ENCOUNTER — Emergency Department (HOSPITAL_COMMUNITY)
Admission: EM | Admit: 2012-11-22 | Discharge: 2012-11-23 | Disposition: A | Discharge status: Home / Self Care | Attending: Emergency Medicine | Admitting: Emergency Medicine

## 2012-11-22 ENCOUNTER — Encounter (HOSPITAL_COMMUNITY): Payer: Self-pay | Admitting: Emergency Medicine

## 2012-11-22 DIAGNOSIS — F17200 Nicotine dependence, unspecified, uncomplicated: Secondary | ICD-10-CM | POA: Insufficient documentation

## 2012-11-22 DIAGNOSIS — C799 Secondary malignant neoplasm of unspecified site: Secondary | ICD-10-CM

## 2012-11-22 DIAGNOSIS — I10 Essential (primary) hypertension: Secondary | ICD-10-CM | POA: Insufficient documentation

## 2012-11-22 DIAGNOSIS — Z79899 Other long term (current) drug therapy: Secondary | ICD-10-CM | POA: Insufficient documentation

## 2012-11-22 DIAGNOSIS — C20 Malignant neoplasm of rectum: Secondary | ICD-10-CM | POA: Insufficient documentation

## 2012-11-22 DIAGNOSIS — C7800 Secondary malignant neoplasm of unspecified lung: Secondary | ICD-10-CM | POA: Insufficient documentation

## 2012-11-22 DIAGNOSIS — M549 Dorsalgia, unspecified: Secondary | ICD-10-CM | POA: Insufficient documentation

## 2012-11-22 LAB — CBC WITH DIFFERENTIAL/PLATELET
Basophils Absolute: 0 10*3/uL (ref 0.0–0.1)
Basophils Relative: 0 % (ref 0–1)
Eosinophils Absolute: 0 10*3/uL (ref 0.0–0.7)
Eosinophils Relative: 0 % (ref 0–5)
HCT: 30.6 % — ABNORMAL LOW (ref 39.0–52.0)
Hemoglobin: 9.9 g/dL — ABNORMAL LOW (ref 13.0–17.0)
Lymphocytes Relative: 7 % — ABNORMAL LOW (ref 12–46)
Lymphs Abs: 0.6 10*3/uL — ABNORMAL LOW (ref 0.7–4.0)
MCH: 27.9 pg (ref 26.0–34.0)
MCHC: 32.4 g/dL (ref 30.0–36.0)
MCV: 86.2 fL (ref 78.0–100.0)
Monocytes Absolute: 0.8 10*3/uL (ref 0.1–1.0)
Monocytes Relative: 9 % (ref 3–12)
Neutro Abs: 8.1 10*3/uL — ABNORMAL HIGH (ref 1.7–7.7)
Neutrophils Relative %: 85 % — ABNORMAL HIGH (ref 43–77)
Platelets: 458 10*3/uL — ABNORMAL HIGH (ref 150–400)
RBC: 3.55 MIL/uL — ABNORMAL LOW (ref 4.22–5.81)
RDW: 15.5 % (ref 11.5–15.5)
WBC: 9.5 10*3/uL (ref 4.0–10.5)

## 2012-11-22 LAB — LIPASE, BLOOD: Lipase: 25 U/L (ref 11–59)

## 2012-11-22 LAB — COMPREHENSIVE METABOLIC PANEL
ALT: 10 U/L (ref 0–53)
AST: 16 U/L (ref 0–37)
Albumin: 3 g/dL — ABNORMAL LOW (ref 3.5–5.2)
Alkaline Phosphatase: 65 U/L (ref 39–117)
BUN: 19 mg/dL (ref 6–23)
CO2: 33 mEq/L — ABNORMAL HIGH (ref 19–32)
Calcium: 9.2 mg/dL (ref 8.4–10.5)
Chloride: 93 mEq/L — ABNORMAL LOW (ref 96–112)
Creatinine, Ser: 0.96 mg/dL (ref 0.50–1.35)
GFR calc Af Amer: >90 mL/min (ref >90)
GFR calc non Af Amer: >90 mL/min (ref >90)
Glucose, Bld: 109 mg/dL — ABNORMAL HIGH (ref 70–99)
Potassium: 3.3 mEq/L — ABNORMAL LOW (ref 3.5–5.1)
Sodium: 135 mEq/L (ref 135–145)
Total Bilirubin: 0.3 mg/dL (ref 0.3–1.2)
Total Protein: 8.2 g/dL (ref 6.0–8.3)

## 2012-11-22 MED ORDER — HYDROMORPHONE HCL PF 1 MG/ML IJ SOLN
1.0000 mg | Freq: Once | INTRAMUSCULAR | Status: AC
  Administered 2012-11-22: 1 mg via INTRAVENOUS
  Filled 2012-11-22: qty 1

## 2012-11-22 MED ORDER — VANCOMYCIN HCL IN DEXTROSE 1-5 GM/200ML-% IV SOLN
1000.0000 mg | Freq: Once | INTRAVENOUS | Status: AC
  Administered 2012-11-22: 1000 mg via INTRAVENOUS
  Filled 2012-11-22: qty 200

## 2012-11-22 NOTE — ED Provider Notes (Signed)
CSN: [id number]     Arrival date & time 11/22/12  2107 History   First MD Initiated Contact with Patient 11/22/12 2209     Chief Complaint  Patient presents with  . Abdominal Pain   (Consider location/radiation/quality/duration/timing/severity/associated sxs/prior Treatment) HPI Patient reports to ED with 3 days of abdominal and back pain. He describes the pain as sharp, constant, 8/10 in severity. It has kept him from sleeping the last two nights. He locates the pain in the right side of the abdomen, and across his entire back. He was recently told by his oncologist that his cancer has spread, and that his pain would increase. He denies nausea, vomiting, diarrhea, chest pain, fever, or problems with bowel movements. He has taken his home Morphine and Percocet without relief. He reports that he is getting "weaker and weaker." He reports no urinary symptoms, but notes that he hasn't been urinating as much as he thinks is normal for the past week.  Past Medical History  Diagnosis Date  . Hypertension     no meds  . Cancer     rectal   . 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 Multiple tooth extractions      9/11  . Portacath placement  04/12/2011    Procedure: INSERTION PORT-A-CATH;  Surgeon: Almond Lint, MD;  Location: [facility];  Service: General;  Laterality: N/A;  port a cath insertion  . Colon surgery  12/11    sbo  . Colon surgery  8/12    colon resection-iliostomy  . Colon surgery  2012    iliostomy takedown  . Colostomy  10/05/2011    Procedure: COLOSTOMY;  Surgeon: Almond Lint, MD;  Location: [facility] OR;  Service: General;  Laterality: N/A;  open colostomy  . Examination under anesthesia  10/05/2011    Procedure: EXAM UNDER ANESTHESIA;  Surgeon: Almond Lint, MD;  Location: [facility] OR;  Service:  General;  Laterality: N/A;  exam under anesthesia  . Proctoscopy  10/05/2011    Procedure: PROCTOSCOPY;  Surgeon: Almond Lint, MD;  Location: [facility] OR;  Service: General;  Laterality: N/A;  rigid proctoscopy  . Laparoscopy  10/05/2011    Procedure: LAPAROSCOPY DIAGNOSTIC;  Surgeon: Almond Lint, MD;  Location: [facility] OR;  Service: General;  Laterality: N/A;  . Laparotomy  11/10/2011    Procedure: EXPLORATORY LAPAROTOMY;  Surgeon: Almond Lint, MD;  Location: [facility];  Service: General;  Laterality: N/A;  SMALL BOWEL RESECTION x 2  . Application of wound vac  11/10/2011    Procedure: APPLICATION OF WOUND VAC;  Surgeon: Almond Lint, MD;  Location: [facility];  Service: General;;  . Laparotomy N/A 10/04/2012    Procedure: EXPLORATORY LAPAROTOMY, ;  Surgeon: Clovis Pu. Cornett, MD;  Location: [facility];  Service: General;  Laterality: N/A;  GTUBE PLACEMENT   Family History  Problem Relation Age of Onset  . Cancer Mother     lung  . Cancer Paternal Uncle    History  Substance Use Topics  . Smoking status: Current Every Day Smoker -- 0.25 packs/day for 12 years    Types: Cigarettes  . Smokeless tobacco: Never Used  . Alcohol Use: Yes     Comment: has quit drinking since last week.     Review of Systems All other systems negative except as documented  in the HPI. All pertinent positives and negatives as reviewed in the HPI. Allergies  Review of patient's allergies indicates no known allergies.  Home Medications   Current Outpatient Rx  Name  Route  Sig  Dispense  Refill  . acetaminophen (TYLENOL) 325 MG tablet   Oral   Take 1-2 tablets (325-650 mg total) by mouth every 6 (six) hours as needed for pain or fever.   80 tablet   3   . amLODipine (NORVASC) 10 MG tablet   Oral   Take 1 tablet (10 mg total) by mouth daily.   30 tablet   3     HOSPICE PATIENT   . diphenoxylate-atropine (LOMOTIL) 2.5-0.025 MG per tablet   Oral   Take 1 tablet by mouth 4 (four) times daily as needed for diarrhea or  loose stools.   30 tablet   2   . feeding supplement (ENSURE COMPLETE) LIQD   Oral   Take 237 mLs by mouth 2 (two) times daily between meals.   30 Bottle   0   . lidocaine-prilocaine (EMLA) cream   Topical   Apply topically as needed. Apply to [facility] site 1-2 hours prior to stick and cover with plastic wrap   30 g   prn   . LORazepam (ATIVAN) 0.5 MG tablet      TAKE 1-2 TABLETS BY MOUTH AT BEDTIME AS NEEDED FOR SLEEP   50 tablet   0     HOSPICE PATIENT   . morphine (MS CONTIN) 60 MG 12 hr tablet   Oral   Take 1 tablet (60 mg total) by mouth 2 (two) times daily.   60 tablet   0   . oxyCODONE-acetaminophen (PERCOCET/ROXICET) 5-325 MG per tablet   Oral   Take 1 tablet by mouth every 4 (four) hours as needed for pain.   100 tablet   0     HOSPICE PATIENT    BP 136/106  Pulse 111  Temp(Src) 98.9 F (37.2 C) (Oral)  Resp 16  SpO2 95% Physical Exam  Nursing note and vitals reviewed. Constitutional: He is oriented to person, place, and time. He appears well-developed and well-nourished. No distress.  HENT:  Head: Normocephalic and atraumatic.  Mouth/Throat: Oropharynx is clear and moist.  Eyes: Pupils are equal, round, and reactive to light.  Neck: Normal range of motion. Neck supple.  Cardiovascular: Normal rate, regular rhythm and normal heart sounds.  Exam reveals no gallop and no friction rub.   No murmur heard. Pulmonary/Chest: Effort normal and breath sounds normal. No respiratory distress. He has no wheezes. He has no rales.  Abdominal: Soft. Bowel sounds are normal. He exhibits no distension. There is tenderness. There is guarding.  Neurological: He is alert and oriented to person, place, and time. He exhibits normal muscle tone. Coordination normal.  Skin: Skin is warm and dry.    ED Course  Procedures (including critical care time) Labs Review Labs Reviewed  CBC WITH DIFFERENTIAL  COMPREHENSIVE METABOLIC PANEL  LIPASE, BLOOD  URINALYSIS, ROUTINE W  REFLEX MICROSCOPIC   Patient's CT scan to evaluate for her abdominal obstruction, based on his past history of the same. MDM     Carlyle Dolly, PA-C 11/23/12 0159

## 2012-11-22 NOTE — ED Notes (Signed)
Pt to ER via EMS from home with c/o upper abd pain x 1 day; pt states that the pain is to bilateral upper quadrants and is tender to palpation; pt denies N/V/D; pt reports that he took his home Morphine and Percocet without relief; pt reports that he called his Hospice Nurse and was advised to come to the ER for evaluation.

## 2012-11-22 NOTE — ED Notes (Signed)
Bed: ZO10 Expected date:  Expected time:  Means of arrival:  Comments: EMS 52 to abd pain, hx of rectal CA

## 2012-11-23 ENCOUNTER — Emergency Department (HOSPITAL_COMMUNITY)

## 2012-11-23 ENCOUNTER — Encounter (HOSPITAL_COMMUNITY): Payer: Self-pay

## 2012-11-23 ENCOUNTER — Encounter: Payer: Self-pay | Admitting: Radiation Oncology

## 2012-11-23 LAB — URINALYSIS, ROUTINE W REFLEX MICROSCOPIC
Glucose, UA: NEGATIVE mg/dL
Hgb urine dipstick: NEGATIVE
Ketones, ur: NEGATIVE mg/dL
Leukocytes, UA: NEGATIVE
Nitrite: NEGATIVE
Protein, ur: 30 mg/dL — AB
Specific Gravity, Urine: 1.032 — ABNORMAL HIGH (ref 1.005–1.030)
Urobilinogen, UA: 1 mg/dL (ref 0.0–1.0)
pH: 6 (ref 5.0–8.0)

## 2012-11-23 LAB — URINE MICROSCOPIC-ADD ON

## 2012-11-23 MED ORDER — HYDROMORPHONE HCL 2 MG PO TABS: 2.0000 mg | ORAL_TABLET | ORAL | Status: DC | PRN

## 2012-11-23 MED ORDER — HYDROMORPHONE HCL PF 1 MG/ML IJ SOLN
1.0000 mg | Freq: Once | INTRAMUSCULAR | Status: AC
  Administered 2012-11-23: 1 mg via INTRAVENOUS
  Filled 2012-11-23: qty 1

## 2012-11-23 MED ORDER — IOHEXOL 300 MG/ML  SOLN
50.0000 mL | Freq: Once | INTRAMUSCULAR | Status: AC | PRN
  Administered 2012-11-23: 50 mL via ORAL

## 2012-11-23 MED ORDER — IOHEXOL 300 MG/ML  SOLN
100.0000 mL | Freq: Once | INTRAMUSCULAR | Status: AC | PRN
  Administered 2012-11-23: 100 mL via INTRAVENOUS

## 2012-11-23 MED ORDER — HEPARIN SOD (PORK) LOCK FLUSH 100 UNIT/ML IV SOLN
500.0000 [IU] | Freq: Once | INTRAVENOUS | Status: AC
  Administered 2012-11-23: 500 [IU]
  Filled 2012-11-23: qty 5

## 2012-11-23 NOTE — ED Provider Notes (Signed)
Please see the initial provider's notes. I was available for consult during the completion of this patient's course.  Gerhard Munch, MD 11/23/12 [phone]

## 2012-11-23 NOTE — ED Provider Notes (Signed)
  Medical screening examination/treatment/procedure(s) were performed by non-physician practitioner and as supervising physician I was immediately available for consultation/collaboration.    Jager Koska, MD 11/23/12 0545 

## 2012-11-23 NOTE — ED Provider Notes (Signed)
CT scan reviewed, this was discussed with the patient and his wife.  He is just requesting pain control, as he's going to live out.  The last of his life as best he can  Arman Filter, NP 11/23/12 0422

## 2012-11-24 ENCOUNTER — Encounter: Payer: Self-pay | Admitting: Radiation Oncology

## 2012-11-24 ENCOUNTER — Ambulatory Visit
Admission: RE | Admit: 2012-11-24 | Discharge: 2012-11-24 | Disposition: A | Discharge status: Home / Self Care | Payer: Medicare Other | Source: Ambulatory Visit | Attending: Radiation Oncology | Admitting: Radiation Oncology

## 2012-11-24 VITALS — BP 132/73 | HR 106 | Temp 98.1°F | Ht 64.0 in | Wt 101.6 lb

## 2012-11-24 DIAGNOSIS — C20 Malignant neoplasm of rectum: Secondary | ICD-10-CM

## 2012-11-24 HISTORY — DX: Personal history of irradiation: Z92.3

## 2012-11-24 NOTE — Progress Notes (Signed)
Radiation Oncology         [phone]) [phone] ________________________________  Name: Jeffery [name] MRN: [id number]  Date: 11/24/2012  DOB: Jul 05, 1960  Follow-Up Visit Note  Outpatient  CC: Geraldo Pitter, MD  Renaye Rakers, MD  Diagnosis and Prior Radiotherapy:   Metastatic Rectal Cancer  INDICATION FOR TREATMENT: Palliative  TREATMENT DATES: 9-23 to 11-13-12  SITE/DOSE: R buttock nodule / 20 Gy in 5 fractions    Narrative:  The patient returns today for routine follow-up.  He feels the nodule over right buttock is smaller and no longer hurts. Requests Morphine refill. He is on hospice.                             ALLERGIES:  has No Known Allergies.  Meds: Current Outpatient Prescriptions  Medication Sig Dispense Refill  . amLODipine (NORVASC) 10 MG tablet Take 1 tablet (10 mg total) by mouth daily.  30 tablet  3  . diphenoxylate-atropine (LOMOTIL) 2.5-0.025 MG per tablet Take 1 tablet by mouth 4 (four) times daily as needed for diarrhea or loose stools.  30 tablet  2  . feeding supplement (ENSURE COMPLETE) LIQD Take 237 mLs by mouth 2 (two) times daily between meals.  30 Bottle  0  . HYDROmorphone (DILAUDID) 2 MG tablet Take 1 tablet (2 mg total) by mouth every 4 (four) hours as needed for pain (For breakthrough pain).  30 tablet  0  . LORazepam (ATIVAN) 0.5 MG tablet TAKE 1-2 TABLETS BY MOUTH AT BEDTIME AS NEEDED FOR SLEEP  50 tablet  0  . morphine (MS CONTIN) 60 MG 12 hr tablet Take 1 tablet (60 mg total) by mouth 2 (two) times daily.  60 tablet  0  . acetaminophen (TYLENOL) 325 MG tablet Take 1-2 tablets (325-650 mg total) by mouth every 6 (six) hours as needed for pain or fever.  80 tablet  3  . lidocaine-prilocaine (EMLA) cream Apply topically as needed. Apply to [facility] site 1-2 hours prior to stick and cover with plastic wrap  30 g  prn  . oxyCODONE-acetaminophen (PERCOCET/ROXICET) 5-325 MG per tablet Take 1 tablet by mouth every 4 (four) hours as needed for pain.  100 tablet  0   No  current facility-administered medications for this encounter.    Physical Findings: The patient is in no acute distress. Patient is alert and oriented.  height is 5\' 4"  (1.626 m) and weight is 101 lb 9.6 oz (46.085 kg). His temperature is 98.1 F (36.7 C). His blood pressure is 132/73 and his pulse is 106. .   Right buttock nodule is clinically about the same as I recall it pre-radiotherapy. Skin intact.   Lab Findings: Lab Results  Component Value Date   WBC 9.5 11/22/2012   HGB 9.9* 11/22/2012   HCT 30.6* 11/22/2012   MCV 86.2 11/22/2012   PLT 458* 11/22/2012    Radiographic Findings: Ct Abdomen Pelvis W Contrast  11/23/2012   *RADIOLOGY REPORT*  Clinical Data: Upper abdominal pain for 1 day.  CT ABDOMEN AND PELVIS WITH CONTRAST  Technique:  Multidetector CT imaging of the abdomen and pelvis was performed following the standard protocol during bolus administration of intravenous contrast.  Contrast:  100 mL of Omnipaque 300 IV contrast  Comparison: CT of the abdomen and pelvis performed 09/28/2012  Findings: Innumerable nodules are noted throughout the lung bases, compatible with metastatic disease to the lungs.  The liver is unremarkable in appearance.  An unusual mass-like density along the lateral aspect of the spleen may reflect a focal metastasis, or simply a fold in the diaphragm.  The spleen itself is unremarkable in appearance.  The gallbladder is grossly unremarkable.  The pancreas and adrenal glands are within normal limits.  A 1.4 cm cyst is noted arising at the anterior aspect of the right kidney.  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  The upper pole moiety is unremarkable in appearance.  The kidneys are otherwise grossly unremarkable.  No significant perinephric stranding is seen, though evaluation is suboptimal due to underlying diffuse anasarca.   No renal or  ureteral stones are seen.  Bilateral ureteral jets are noted on delayed imaging.  There is dilatation of small bowel loops to 4.8 cm in maximal diameter, and dilatation of the colon to 6.7 cm.  This appears to reflect some degree of distal obstruction due to apparent partially calcified mass within the pelvis, and also due to the irregular mass surrounding the anorectal canal, measuring perhaps 6.3 x 4.2 cm.  Additional nodules are noted within the subcutaneous soft tissues, including to the right of the umbilicus (measuring 3.0 cm and 3.5 cm) and posterior to the right hip joint (measuring 3.1 cm).  The stomach is within normal limits.  No acute vascular abnormalities are seen. Scattered calcification is noted along the abdominal aorta and its branches.  The appendix is normal in caliber and contains air, without evidence for appendicitis.  The bladder is mildly distended and grossly unremarkable in appearance.  The prostate remains normal in size.  No inguinal lymphadenopathy is seen.  No acute osseous abnormalities are identified.  IMPRESSION:  1.  Dilatation of small bowel loops to 4.8 cm in maximal diameter, and dilatation of the colon to 6.7 cm.  This may reflect dysmotility, or partial distal obstruction due to the partially calcified mass within the pelvis, and also due to an apparent irregular mass surrounding the anorectal canal, measuring perhaps 6.3 x 4.2 cm. 2.  Additional metastatic nodules noted within the soft tissues of the abdomen and pelvis, as described above. 3.  Innumerable nodules throughout the lung bases, compatible with metastatic disease to the lungs. 4.  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  Bilateral ureteral jets are seen. 5.  Diffuse soft tissue edema, compatible with anasarca.   Original Report Authenticated By: Tonia Ghent, M.D.    Impression/Plan:  He fortunately appreciated improved pain since RT.   Continue hospice - I told him to speak with his hospice nurse to arrange regular medication refills with his hospice doctor.  I will see him back PRN. I wished him the best.   I spent 10 minutes face to face with the patient and more than 50% of that time was spent in counseling and/or coordination of care. _____________________________________   Lonie Peak, MD

## 2012-11-24 NOTE — Progress Notes (Addendum)
Jeffery [name] here with his fiance for follow up after treatment to his right buttock nodule.  He has cramping in his stomach that he is rating at a 4/10.  He is currently taking dilaudid 2 mg po q 4 hours prn and Morphine 60 mg po q 12 hours.  He is requesting a refill on his morphine.  He went to the ER due to pain on Wednesday and was given the dilaudid.  He has stopped taking percocet.  He denies diarrhea and nausea.  His appetite is OK.  He has lost 3 lbs since 11/13/2012.  He says he is having a hard time getting ensure due to cost.  The dietician is going to have samples for him ready on Monday.  He reports the nodule on his right buttock is smaller and is not painful.  He is fatigued.  His bp on arrival in his left arm was 151/102 and hr was 106.  When retaken in his right arm, it was 132/73 with hr 106.  He has not taken his amlodipine today.

## 2012-11-26 ENCOUNTER — Encounter (HOSPITAL_COMMUNITY): Payer: Self-pay | Admitting: Emergency Medicine

## 2012-11-26 ENCOUNTER — Emergency Department (HOSPITAL_COMMUNITY)

## 2012-11-26 ENCOUNTER — Emergency Department (HOSPITAL_COMMUNITY)
Admission: EM | Admit: 2012-11-26 | Discharge: 2012-11-26 | Disposition: A | Discharge status: Home / Self Care | Attending: Emergency Medicine | Admitting: Emergency Medicine

## 2012-11-26 DIAGNOSIS — F17200 Nicotine dependence, unspecified, uncomplicated: Secondary | ICD-10-CM | POA: Insufficient documentation

## 2012-11-26 DIAGNOSIS — R109 Unspecified abdominal pain: Secondary | ICD-10-CM

## 2012-11-26 DIAGNOSIS — I10 Essential (primary) hypertension: Secondary | ICD-10-CM | POA: Insufficient documentation

## 2012-11-26 DIAGNOSIS — E860 Dehydration: Secondary | ICD-10-CM | POA: Insufficient documentation

## 2012-11-26 DIAGNOSIS — Z85048 Personal history of other malignant neoplasm of rectum, rectosigmoid junction, and anus: Secondary | ICD-10-CM | POA: Insufficient documentation

## 2012-11-26 DIAGNOSIS — Z85118 Personal history of other malignant neoplasm of bronchus and lung: Secondary | ICD-10-CM | POA: Insufficient documentation

## 2012-11-26 DIAGNOSIS — Z923 Personal history of irradiation: Secondary | ICD-10-CM | POA: Insufficient documentation

## 2012-11-26 DIAGNOSIS — R000 Tachycardia, unspecified: Secondary | ICD-10-CM | POA: Insufficient documentation

## 2012-11-26 DIAGNOSIS — Z79899 Other long term (current) drug therapy: Secondary | ICD-10-CM | POA: Insufficient documentation

## 2012-11-26 DIAGNOSIS — R1084 Generalized abdominal pain: Secondary | ICD-10-CM | POA: Insufficient documentation

## 2012-11-26 DIAGNOSIS — G8929 Other chronic pain: Secondary | ICD-10-CM | POA: Insufficient documentation

## 2012-11-26 LAB — CBC WITH DIFFERENTIAL/PLATELET
Basophils Absolute: 0 10*3/uL (ref 0.0–0.1)
Basophils Relative: 0 % (ref 0–1)
Eosinophils Relative: 0 % (ref 0–5)
Lymphocytes Relative: 8 % — ABNORMAL LOW (ref 12–46)
MCHC: 32.7 g/dL (ref 30.0–36.0)
Monocytes Absolute: 0.6 10*3/uL (ref 0.1–1.0)
Neutro Abs: 7.2 10*3/uL (ref 1.7–7.7)
Platelets: 374 10*3/uL (ref 150–400)
RDW: 15.7 % — ABNORMAL HIGH (ref 11.5–15.5)
WBC: 8.4 10*3/uL (ref 4.0–10.5)

## 2012-11-26 LAB — COMPREHENSIVE METABOLIC PANEL
ALT: 14 U/L (ref 0–53)
AST: 17 U/L (ref 0–37)
Albumin: 2.8 g/dL — ABNORMAL LOW (ref 3.5–5.2)
CO2: 29 mEq/L (ref 19–32)
Calcium: 8.6 mg/dL (ref 8.4–10.5)
Chloride: 95 mEq/L — ABNORMAL LOW (ref 96–112)
Creatinine, Ser: 0.77 mg/dL (ref 0.50–1.35)
GFR calc non Af Amer: >90 mL/min (ref >90)
Sodium: 133 mEq/L — ABNORMAL LOW (ref 135–145)

## 2012-11-26 MED ORDER — SODIUM CHLORIDE 0.9 % IV BOLUS (SEPSIS)
1000.0000 mL | Freq: Once | INTRAVENOUS | Status: AC
  Administered 2012-11-26: 1000 mL via INTRAVENOUS

## 2012-11-26 MED ORDER — HYDROMORPHONE HCL PF 1 MG/ML IJ SOLN
2.0000 mg | Freq: Once | INTRAMUSCULAR | Status: AC
  Administered 2012-11-26: 2 mg via INTRAVENOUS
  Filled 2012-11-26 (×2): qty 2

## 2012-11-26 MED ORDER — SODIUM CHLORIDE 0.9 % IV SOLN: INTRAVENOUS | Status: DC

## 2012-11-26 MED ORDER — HYDROMORPHONE HCL 2 MG PO TABS: 2.0000 mg | ORAL_TABLET | ORAL | Status: DC | PRN

## 2012-11-26 MED ORDER — HEPARIN SOD (PORK) LOCK FLUSH 100 UNIT/ML IV SOLN
500.0000 [IU] | Freq: Once | INTRAVENOUS | Status: AC
  Administered 2012-11-26: 500 [IU]
  Filled 2012-11-26: qty 5

## 2012-11-26 NOTE — ED Notes (Signed)
Patient transported to X-ray 

## 2012-11-26 NOTE — ED Notes (Signed)
Bed: IO96 Expected date:  Expected time:  Means of arrival:  Comments: ems- 52 yo M, HTN and uncontrolled pain. Seen in ED earlier this week

## 2012-11-26 NOTE — ED Notes (Signed)
Pt complains of abd pain. Pt states pain is chronic but worse for past day. Seen here Thursday for same, states pain medication not working. Pt states he has nausea but no v/d. Pt has foley and colostomy.

## 2012-11-26 NOTE — ED Provider Notes (Signed)
CSN: [id number]     Arrival date & time 11/26/12  1251 History   First MD Initiated Contact with Patient 11/26/12 1313     Chief Complaint  Patient presents with  . Abdominal Pain   (Consider location/radiation/quality/duration/timing/severity/associated sxs/prior Treatment) Patient is a 52 y.o. male presenting with abdominal pain. The history is provided by the patient.  Abdominal Pain  complaining of worsening chronic abdominal pain x2 days. Seen here 2 days ago for similar symptoms. He has a history of adenocarcinoma of the rectum and he is on hospice therapy at this time. He is taking MS Contin at home and he was seen on Thursday hydromorphone was added. Since that time, he has not had any vomiting or fever. No black or bloody stools. Only worsening pain. No urinary symptoms. He did not call his hospice nurse prior to arrival All records reviewed and patient was noted to have a possible partial small bowel obstruction  Past Medical History  Diagnosis Date  . Hypertension     no meds  . Cancer     rectal   . 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 S/P radiation therapy 9-23 to 11-13-12                                                          R buttock nodule / 20 Gy in 5 fractions                          Past Surgical History  Procedure Laterality Date  . Multiple tooth extractions      9/11  . Portacath placement  04/12/2011    Procedure: INSERTION PORT-A-CATH;  Surgeon: Almond Lint, MD;  Location: [facility];  Service: General;  Laterality: N/A;  port a cath insertion  . Colon surgery  12/11    sbo  . Colon surgery  8/12    colon resection-iliostomy  . Colon surgery  2012    iliostomy takedown  . Colostomy  10/05/2011    Procedure: COLOSTOMY;  Surgeon: Almond Lint, MD;  Location: [facility];  Service: General;  Laterality: N/A;  open colostomy   . Examination under anesthesia  10/05/2011    Procedure: EXAM UNDER ANESTHESIA;  Surgeon: Almond Lint, MD;  Location: [facility];  Service: General;  Laterality: N/A;  exam under anesthesia  . Proctoscopy  10/05/2011    Procedure: PROCTOSCOPY;  Surgeon: Almond Lint, MD;  Location: [facility];  Service: General;  Laterality: N/A;  rigid proctoscopy  . Laparoscopy  10/05/2011    Procedure: LAPAROSCOPY DIAGNOSTIC;  Surgeon: Almond Lint, MD;  Location: [facility];  Service: General;  Laterality: N/A;  . Laparotomy  11/10/2011    Procedure: EXPLORATORY LAPAROTOMY;  Surgeon: Almond Lint, MD;  Location: [facility];  Service: General;  Laterality: N/A;  SMALL BOWEL RESECTION x 2  . Application of wound vac  11/10/2011    Procedure: APPLICATION OF WOUND VAC;  Surgeon: Almond Lint, MD;  Location: [facility];  Service: General;;  . Laparotomy N/A 10/04/2012    Procedure: EXPLORATORY LAPAROTOMY, ;  Surgeon: Clovis Pu. Cornett, MD;  Location: [facility];  Service: General;  Laterality: N/A;  GTUBE PLACEMENT   Family History  Problem Relation Age of Onset  . Cancer Mother     lung  . Cancer Paternal Uncle    History  Substance Use Topics  . Smoking status: Current Every Day Smoker -- 0.25 packs/day for 12 years    Types: Cigarettes  . Smokeless tobacco: Never Used  . Alcohol Use: Yes     Comment: has quit drinking since last week.     Review of Systems  Gastrointestinal: Positive for abdominal pain.  All other systems reviewed and are negative.    Allergies  Review of patient's allergies indicates no known allergies.  Home Medications   Current Outpatient Rx  Name  Route  Sig  Dispense  Refill  . amLODipine (NORVASC) 10 MG tablet   Oral   Take 1 tablet (10 mg total) by mouth daily.   30 tablet   3     HOSPICE PATIENT   . HYDROmorphone (DILAUDID) 2 MG tablet   Oral   Take 1 tablet (2 mg total) by mouth every 4 (four) hours as needed for pain (For breakthrough pain).   30 tablet   0   . LORazepam  (ATIVAN) 0.5 MG tablet   Oral   Take 0.5-1 mg by mouth at bedtime as needed (sleep).         . morphine (MS CONTIN) 60 MG 12 hr tablet   Oral   Take 1 tablet (60 mg total) by mouth 2 (two) times daily.   60 tablet   0    BP 157/111  Pulse 111  Temp(Src) 98.8 F (37.1 C) (Oral)  Resp 16  SpO2 96% Physical Exam  Nursing note and vitals reviewed. Constitutional: He is oriented to person, place, and time. He appears cachectic.  Non-toxic appearance. He has a sickly appearance. He appears ill. No distress.  HENT:  Head: Normocephalic and atraumatic.  Eyes: Conjunctivae, EOM and lids are normal. Pupils are equal, round, and reactive to light.  Neck: Normal range of motion. Neck supple. No tracheal deviation present. No mass present.  Cardiovascular: Regular rhythm and normal heart sounds.  Tachycardia present.  Exam reveals no gallop.   No murmur heard. Pulmonary/Chest: Effort normal and breath sounds normal. No stridor. No respiratory distress. He has no decreased breath sounds. He has no wheezes. He has no rhonchi. He has no rales.  Abdominal: Soft. Normal appearance and bowel sounds are normal. He exhibits no distension. There is generalized tenderness. There is no rigidity, no rebound, no guarding and no CVA tenderness.  Musculoskeletal: Normal range of motion. He exhibits no edema and no tenderness.  Neurological: He is alert and oriented to person, place, and time. He has normal strength. No cranial nerve deficit or sensory deficit. GCS eye subscore is 4. GCS verbal subscore is 5. GCS motor subscore is 6.  Skin: Skin is warm and dry. No abrasion and no rash noted.  Psychiatric: His affect is blunt. His speech is delayed. He is slowed.    ED Course  Procedures (including critical care time) Labs Review Labs Reviewed  CBC WITH DIFFERENTIAL  COMPREHENSIVE METABOLIC PANEL   Imaging Review No results found.  EKG Interpretation   None       MDM  No diagnosis  found. Patient given IV fluids and pain medication here and he feels much better. He is still making fluid through his colostomy bag and he is not vomiting at this time. Do not think he  has a bowel obstruction. I will write for him to have more pain medication and he will call hospice tomorrow and is requesting to go home   Toy Baker, MD 11/26/12 1537

## 2012-11-26 NOTE — ED Notes (Signed)
Unable to pull blood from port, mini lab informed.

## 2012-11-27 NOTE — Progress Notes (Signed)
Simulation Verification Note Outpatient Right Buttock  The patient was brought to the treatment unit and placed in the planned treatment position. The clinical setup was verified. Then port films were obtained and uploaded to the radiation oncology medical record software.  The treatment beams were carefully compared against the planned radiation fields. The position location and shape of the radiation fields was reviewed. They targeted volume of tissue appears to be appropriately covered by the radiation beams. Organs at risk appear to be excluded as planned.  Based on my personal review, I approved the simulation verification. The patient's treatment will proceed as planned.  -----------------------------------  Lonie Peak, MD

## 2012-11-30 ENCOUNTER — Telehealth: Payer: Self-pay | Admitting: *Deleted

## 2012-11-30 DIAGNOSIS — C20 Malignant neoplasm of rectum: Secondary | ICD-10-CM

## 2012-11-30 NOTE — Telephone Encounter (Signed)
Hospice MD ordered MS Contin 60 mg Q 12 hours #60 on 10/13. Went to home today to check bottle and he is missing #14 pills. Patient thinks a family member may have taken them. RN suggested a locked box for his narcotic scripts to be kept. Increased his 2 mg hydromorphone to 1-2 tabs every 4 hours as needed and with this his pain is well controlled. Has developed some nausea, so started Compazine 10 mg every 6 hours prn. Losing weight and getting weaker. She has increased her visits to twice weekly.

## 2012-12-04 ENCOUNTER — Telehealth: Payer: Self-pay | Admitting: *Deleted

## 2012-12-04 NOTE — Telephone Encounter (Signed)
Patient requests to be DNR status-asking for permission for Hospice MD to sign order? They refilled his 2mg  hydromorphone today.

## 2012-12-05 NOTE — Telephone Encounter (Signed)
Left message on voicemail for Corrie Dandy, RN with hospice: OK for Hospice MD to sign DNR order, per Dr. Truett Perna.

## 2012-12-11 ENCOUNTER — Emergency Department (HOSPITAL_COMMUNITY)
Admission: EM | Admit: 2012-12-11 | Discharge: 2012-12-11 | Disposition: A | Discharge status: Home / Self Care | Attending: Emergency Medicine | Admitting: Emergency Medicine

## 2012-12-11 ENCOUNTER — Encounter (HOSPITAL_COMMUNITY): Payer: Self-pay | Admitting: Emergency Medicine

## 2012-12-11 ENCOUNTER — Emergency Department (HOSPITAL_COMMUNITY)

## 2012-12-11 DIAGNOSIS — C189 Malignant neoplasm of colon, unspecified: Secondary | ICD-10-CM

## 2012-12-11 DIAGNOSIS — Z923 Personal history of irradiation: Secondary | ICD-10-CM | POA: Insufficient documentation

## 2012-12-11 DIAGNOSIS — Z85048 Personal history of other malignant neoplasm of rectum, rectosigmoid junction, and anus: Secondary | ICD-10-CM | POA: Insufficient documentation

## 2012-12-11 DIAGNOSIS — Z9889 Other specified postprocedural states: Secondary | ICD-10-CM | POA: Insufficient documentation

## 2012-12-11 DIAGNOSIS — C785 Secondary malignant neoplasm of large intestine and rectum: Secondary | ICD-10-CM | POA: Insufficient documentation

## 2012-12-11 DIAGNOSIS — Z79899 Other long term (current) drug therapy: Secondary | ICD-10-CM | POA: Insufficient documentation

## 2012-12-11 DIAGNOSIS — Z85118 Personal history of other malignant neoplasm of bronchus and lung: Secondary | ICD-10-CM | POA: Insufficient documentation

## 2012-12-11 DIAGNOSIS — R1084 Generalized abdominal pain: Secondary | ICD-10-CM | POA: Insufficient documentation

## 2012-12-11 DIAGNOSIS — I10 Essential (primary) hypertension: Secondary | ICD-10-CM | POA: Insufficient documentation

## 2012-12-11 DIAGNOSIS — R109 Unspecified abdominal pain: Secondary | ICD-10-CM

## 2012-12-11 DIAGNOSIS — F17200 Nicotine dependence, unspecified, uncomplicated: Secondary | ICD-10-CM | POA: Insufficient documentation

## 2012-12-11 LAB — CBC WITH DIFFERENTIAL/PLATELET
Basophils Absolute: 0 10*3/uL (ref 0.0–0.1)
Basophils Relative: 0 % (ref 0–1)
Eosinophils Absolute: 0 10*3/uL (ref 0.0–0.7)
Eosinophils Relative: 0 % (ref 0–5)
HCT: 31.6 % — ABNORMAL LOW (ref 39.0–52.0)
Hemoglobin: 10.2 g/dL — ABNORMAL LOW (ref 13.0–17.0)
Lymphocytes Relative: 4 % — ABNORMAL LOW (ref 12–46)
MCHC: 32.3 g/dL (ref 30.0–36.0)
MCV: 86.8 fL (ref 78.0–100.0)
Monocytes Absolute: 0.5 10*3/uL (ref 0.1–1.0)
Neutrophils Relative %: 92 % — ABNORMAL HIGH (ref 43–77)
Platelets: 354 10*3/uL (ref 150–400)
RDW: 15.8 % — ABNORMAL HIGH (ref 11.5–15.5)
WBC: 11.1 10*3/uL — ABNORMAL HIGH (ref 4.0–10.5)

## 2012-12-11 LAB — COMPREHENSIVE METABOLIC PANEL
ALT: 10 U/L (ref 0–53)
AST: 14 U/L (ref 0–37)
Albumin: 2.8 g/dL — ABNORMAL LOW (ref 3.5–5.2)
CO2: 27 mEq/L (ref 19–32)
Calcium: 8.9 mg/dL (ref 8.4–10.5)
Creatinine, Ser: 0.58 mg/dL (ref 0.50–1.35)
GFR calc non Af Amer: >90 mL/min (ref >90)
Potassium: 3.3 mEq/L — ABNORMAL LOW (ref 3.5–5.1)
Sodium: 133 mEq/L — ABNORMAL LOW (ref 135–145)
Total Protein: 7.9 g/dL (ref 6.0–8.3)

## 2012-12-11 MED ORDER — ONDANSETRON HCL 4 MG/2ML IJ SOLN
4.0000 mg | Freq: Once | INTRAMUSCULAR | Status: AC
  Administered 2012-12-11: 4 mg via INTRAVENOUS
  Filled 2012-12-11: qty 2

## 2012-12-11 MED ORDER — HYDROMORPHONE HCL PF 1 MG/ML IJ SOLN
1.0000 mg | Freq: Once | INTRAMUSCULAR | Status: AC
  Administered 2012-12-11: 1 mg via INTRAVENOUS
  Filled 2012-12-11: qty 1

## 2012-12-11 MED ORDER — SODIUM CHLORIDE 0.9 % IV BOLUS (SEPSIS)
500.0000 mL | Freq: Once | INTRAVENOUS | Status: AC
  Administered 2012-12-11: 500 mL via INTRAVENOUS

## 2012-12-11 MED ORDER — HYDROMORPHONE HCL PF 1 MG/ML IJ SOLN
0.5000 mg | Freq: Once | INTRAMUSCULAR | Status: AC
  Administered 2012-12-11: 0.5 mg via INTRAVENOUS
  Filled 2012-12-11: qty 1

## 2012-12-11 MED ORDER — HEPARIN SOD (PORK) LOCK FLUSH 100 UNIT/ML IV SOLN
500.0000 [IU] | Freq: Once | INTRAVENOUS | Status: AC
  Administered 2012-12-11: 500 [IU]
  Filled 2012-12-11: qty 5

## 2012-12-11 NOTE — ED Notes (Signed)
Pt presents via EMS d/t abdominal and back pain.  Pt also reports 1 week hx of diarrhea.  Pain is rated 8/10, sharp in nature.

## 2012-12-11 NOTE — ED Notes (Signed)
Patient transported to X-ray 

## 2012-12-11 NOTE — ED Provider Notes (Addendum)
CSN: [id number]     Arrival date & time 12/11/12  0515 History   First MD Initiated Contact with Patient 12/11/12 [phone]     Chief Complaint  Patient presents with  . Cancer Pain    (Consider location/radiation/quality/duration/timing/severity/associated sxs/prior Treatment) Patient is a 52 y.o. male presenting with abdominal pain. The history is provided by the patient. No language interpreter was used.  Abdominal Pain Pain location:  Generalized Associated symptoms: diarrhea   Associated symptoms: no chest pain, no dysuria, no fever, no shortness of breath, no sore throat and no vomiting   Associated symptoms comment:  Abdominal pain radiating into his back that he relates as pain associated with known metastatic colon cancer. No fever. He is not vomiting but reports nonbloody diarrhea for the past one week. He denies fever. He is taking his usual medications without improvement. He is currently under hospice care without chemo or radiation treatments stating "there's nothing more they can do for me."   Past Medical History  Diagnosis Date  . Hypertension     no meds  . Cancer     rectal   . 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 S/P radiation therapy 9-23 to 11-13-12                                                          R buttock nodule / 20 Gy in 5 fractions                          Past Surgical History  Procedure Laterality Date  . Multiple tooth extractions      9/11  . Portacath placement  04/12/2011    Procedure: INSERTION PORT-A-CATH;  Surgeon: Almond Lint, MD;  Location: [facility];  Service: General;  Laterality: N/A;  port a cath insertion  . Colon surgery  12/11    sbo  . Colon surgery  8/12    colon resection-iliostomy  . Colon surgery  2012    iliostomy takedown  . Colostomy  10/05/2011    Procedure: COLOSTOMY;  Surgeon:  Almond Lint, MD;  Location: [facility];  Service: General;  Laterality: N/A;  open colostomy  . Examination under anesthesia  10/05/2011    Procedure: EXAM UNDER ANESTHESIA;  Surgeon: Almond Lint, MD;  Location: [facility];  Service: General;  Laterality: N/A;  exam under anesthesia  . Proctoscopy  10/05/2011    Procedure: PROCTOSCOPY;  Surgeon: Almond Lint, MD;  Location: [facility];  Service: General;  Laterality: N/A;  rigid proctoscopy  . Laparoscopy  10/05/2011    Procedure: LAPAROSCOPY DIAGNOSTIC;  Surgeon: Almond Lint, MD;  Location: [facility];  Service: General;  Laterality: N/A;  . Laparotomy  11/10/2011    Procedure: EXPLORATORY LAPAROTOMY;  Surgeon: Almond Lint, MD;  Location: [facility];  Service: General;  Laterality: N/A;  SMALL BOWEL RESECTION x 2  . Application of wound vac  11/10/2011    Procedure: APPLICATION OF WOUND VAC;  Surgeon: Almond Lint, MD;  Location: [facility];  Service: General;;  . Laparotomy N/A 10/04/2012    Procedure: EXPLORATORY LAPAROTOMY, ;  Surgeon: Clovis Pu. Cornett, MD;  Location: [facility];  Service: General;  Laterality: N/A;  GTUBE PLACEMENT   Family History  Problem Relation Age of Onset  . Cancer Mother     lung  . Cancer Paternal Uncle    History  Substance Use Topics  . Smoking status: Current Every Day Smoker -- 0.25 packs/day for 12 years    Types: Cigarettes  . Smokeless tobacco: Never Used  . Alcohol Use: No     Comment: pt states has not drank in approx 1 month    Review of Systems  Constitutional: Negative for fever.  HENT: Negative for congestion and sore throat.   Respiratory: Negative for shortness of breath.   Cardiovascular: Negative for chest pain.  Gastrointestinal: Positive for abdominal pain and diarrhea. Negative for vomiting and blood in stool.  Genitourinary: Negative for dysuria.  Musculoskeletal: Positive for myalgias.  Skin: Negative for rash.  Neurological: Positive for weakness.  Psychiatric/Behavioral: Negative for confusion.     Allergies  Review of patient's allergies indicates no known allergies.  Home Medications   Current Outpatient Rx  Name  Route  Sig  Dispense  Refill  . amLODipine (NORVASC) 10 MG tablet   Oral   Take 1 tablet (10 mg total) by mouth daily.   30 tablet   3     HOSPICE PATIENT   . HYDROmorphone (DILAUDID) 2 MG tablet   Oral   Take 1 tablet (2 mg total) by mouth every 4 (four) hours as needed for pain (For breakthrough pain).   20 tablet   0   . LORazepam (ATIVAN) 0.5 MG tablet   Oral   Take 0.5-1 mg by mouth at bedtime as needed (sleep).         . morphine (MS CONTIN) 60 MG 12 hr tablet   Oral   Take 1 tablet (60 mg total) by mouth 2 (two) times daily.   60 tablet   0   . oxyCODONE-acetaminophen (PERCOCET/ROXICET) 5-325 MG per tablet   Oral   Take 1 tablet by mouth every 4 (four) hours as needed for pain.          [name] prochlorperazine (COMPAZINE) 10 MG tablet   Oral   Take 10 mg by mouth every 6 (six) hours as needed (nausea).       0    BP 150/108  Pulse 108  Temp(Src) 97.9 F (36.6 C) (Oral)  Ht 5\' 4"  (1.626 m)  Wt 100 lb (45.36 kg)  BMI 17.16 kg/m2  SpO2 100% Physical Exam  Constitutional: He is oriented to person, place, and time. He appears well-developed and well-nourished.  Cachectic male who is uncomfortable appearing.  HENT:  Head: Normocephalic.  Neck: Normal range of motion. Neck supple.  Cardiovascular: Normal rate and regular rhythm.   Pulmonary/Chest: Effort normal and breath sounds normal.  Abdominal: Soft. Bowel sounds are normal. There is tenderness. There is no rebound and no guarding.  G-tube and colostomy without tenderness. Sites unremarkable. Palpable mass RLQ.  Musculoskeletal: Normal range of motion.  Neurological: He is alert and oriented to person, place, and time.  Skin: Skin is warm and dry. No rash noted.  Psychiatric: He has a normal mood and affect.    ED Course  Procedures (including critical care time) Labs  Review Labs Reviewed  CBC WITH DIFFERENTIAL  COMPREHENSIVE METABOLIC PANEL  LIPASE, BLOOD   Results for orders placed during the hospital encounter of 12/11/12  CBC WITH DIFFERENTIAL      Result Value Range  WBC 11.1 (*) 4.0 - 10.5 K/uL   RBC 3.64 (*) 4.22 - 5.81 MIL/uL   Hemoglobin 10.2 (*) 13.0 - 17.0 g/dL   HCT 16.1 (*) 09.6 - 04.5 %   MCV 86.8  78.0 - 100.0 fL   MCH 28.0  26.0 - 34.0 pg   MCHC 32.3  30.0 - 36.0 g/dL   RDW 40.9 (*) 81.1 - 91.4 %   Platelets 354  150 - 400 K/uL   Neutrophils Relative % 92 (*) 43 - 77 %   Neutro Abs 10.2 (*) 1.7 - 7.7 K/uL   Lymphocytes Relative 4 (*) 12 - 46 %   Lymphs Abs 0.4 (*) 0.7 - 4.0 K/uL   Monocytes Relative 4  3 - 12 %   Monocytes Absolute 0.5  0.1 - 1.0 K/uL   Eosinophils Relative 0  0 - 5 %   Eosinophils Absolute 0.0  0.0 - 0.7 K/uL   Basophils Relative 0  0 - 1 %   Basophils Absolute 0.0  0.0 - 0.1 K/uL  COMPREHENSIVE METABOLIC PANEL      Result Value Range   Sodium 133 (*) 135 - 145 mEq/L   Potassium 3.3 (*) 3.5 - 5.1 mEq/L   Chloride 97  96 - 112 mEq/L   CO2 27  19 - 32 mEq/L   Glucose, Bld 145 (*) 70 - 99 mg/dL   BUN 21  6 - 23 mg/dL   Creatinine, Ser 7.82  0.50 - 1.35 mg/dL   Calcium 8.9  8.4 - 95.6 mg/dL   Total Protein 7.9  6.0 - 8.3 g/dL   Albumin 2.8 (*) 3.5 - 5.2 g/dL   AST 14  0 - 37 U/L   ALT 10  0 - 53 U/L   Alkaline Phosphatase 61  39 - 117 U/L   Total Bilirubin 0.3  0.3 - 1.2 mg/dL   GFR calc non Af Amer >90  >90 mL/min   GFR calc Af Amer >90  >90 mL/min  LIPASE, BLOOD      Result Value Range   Lipase 20  11 - 59 U/L   Ct Abdomen Pelvis W Contrast  11/23/2012   *RADIOLOGY REPORT*  Clinical Data: Upper abdominal pain for 1 day.  CT ABDOMEN AND PELVIS WITH CONTRAST  Technique:  Multidetector CT imaging of the abdomen and pelvis was performed following the standard protocol during bolus administration of intravenous contrast.  Contrast:  100 mL of Omnipaque 300 IV contrast  Comparison: CT of the abdomen and  pelvis performed 09/28/2012  Findings: Innumerable nodules are noted throughout the lung bases, compatible with metastatic disease to the lungs.  The liver is unremarkable in appearance.  An unusual mass-like density along the lateral aspect of the spleen may reflect a focal metastasis, or simply a fold in the diaphragm.  The spleen itself is unremarkable in appearance.  The gallbladder is grossly unremarkable.  The pancreas and adrenal glands are within normal limits.  A 1.4 cm cyst is noted arising at the anterior aspect of the right kidney.  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  The upper pole moiety is unremarkable in appearance.  The kidneys are otherwise grossly unremarkable.  No significant perinephric stranding is seen, though evaluation is suboptimal due to underlying diffuse anasarca.   No renal or ureteral stones are seen.  Bilateral ureteral jets are noted on delayed  imaging.  There is dilatation of small bowel loops to 4.8 cm in maximal diameter, and dilatation of the colon to 6.7 cm.  This appears to reflect some degree of distal obstruction due to apparent partially calcified mass within the pelvis, and also due to the irregular mass surrounding the anorectal canal, measuring perhaps 6.3 x 4.2 cm.  Additional nodules are noted within the subcutaneous soft tissues, including to the right of the umbilicus (measuring 3.0 cm and 3.5 cm) and posterior to the right hip joint (measuring 3.1 cm).  The stomach is within normal limits.  No acute vascular abnormalities are seen. Scattered calcification is noted along the abdominal aorta and its branches.  The appendix is normal in caliber and contains air, without evidence for appendicitis.  The bladder is mildly distended and grossly unremarkable in appearance.  The prostate remains normal in size.  No inguinal lymphadenopathy is seen.  No  acute osseous abnormalities are identified.  IMPRESSION:  1.  Dilatation of small bowel loops to 4.8 cm in maximal diameter, and dilatation of the colon to 6.7 cm.  This may reflect dysmotility, or partial distal obstruction due to the partially calcified mass within the pelvis, and also due to an apparent irregular mass surrounding the anorectal canal, measuring perhaps 6.3 x 4.2 cm. 2.  Additional metastatic nodules noted within the soft tissues of the abdomen and pelvis, as described above. 3.  Innumerable nodules throughout the lung bases, compatible with metastatic disease to the lungs. 4.  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  Bilateral ureteral jets are seen. 5.  Diffuse soft tissue edema, compatible with anasarca.   Original Report Authenticated By: Tonia Ghent, M.D.   Dg Abd Acute W/chest  12/11/2012   CLINICAL DATA:  Abdominal/back pain, metastatic colon cancer  EXAM: ACUTE ABDOMEN SERIES (ABDOMEN 2 VIEW & CHEST 1 VIEW)  COMPARISON:  Chest/abdominal radiographs dated 11/26/2012. CT abdomen pelvis dated 11/23/2012.  FINDINGS: Multiple pulmonary nodules/masses measuring up to 3.3 cm in the right mid/lower lung. No pleural effusion or pneumothorax.  The heart is normal in size.  Stable right chest power port.  Dilated loops of small bowel in the mid abdomen measuring up to 5.1 cm. However, the colon is also mildly prominent. Multiple air-fluid levels on the upright view. These findings are similar to the prior study and may reflect adynamic ileus or possibly distal colonic obstruction.  No evidence of free air under the diaphragm on the upright view.  Degenerative changes of the lumbar spine.  IMPRESSION: Multiple pulmonary metastases.  Dilated loops of small bowel and colon, grossly unchanged, possibly reflecting adynamic ileus or distal colonic obstruction.  No free air.   Electronically Signed   By: Charline Bills M.D.    On: 12/11/2012 08:02   Dg Abd Acute W/chest  11/26/2012   CLINICAL DATA:  Abdominal pain.  EXAM: ACUTE ABDOMEN SERIES (ABDOMEN 2 VIEW & CHEST 1 VIEW)  COMPARISON:  Multiple priors, most recently abdominal radiograph 10/02/2012.  FINDINGS: Innumerable pulmonary nodules and masses are noted, compatible with widespread metastatic disease to the lungs. The largest of these measures up to 3.2 x 2.5 cm in diameter in the right mid lung. No definite acute consolidative airspace disease. No pleural effusions. No evidence of pulmonary edema. Heart size is normal. Upper mediastinal contours are within normal limits. Right subclavian single-lumen power porta cath with tip terminating at the superior cavoatrial junction.  Supine and upright  views of the abdomen demonstrate a extensive gas and stool scattered throughout the colon. There is a paucity of distal rectal gas noted. Numerous dilated loops of small bowel are noted, with multiple air-fluid levels on the upright projection. No frank pneumoperitoneum is confidently identified at this time. Suture lines are noted in the left upper quadrant of the abdomen and in the low anatomic pelvis, likely related to prior bowel resection.  IMPRESSION: 1. Bowel gas pattern suggests early or partial small bowel obstruction, as above. 2. Widespread metastatic disease to the lungs redemonstrated. 3. No frank pneumoperitoneum at this time.   Electronically Signed   By: Trudie Reed M.D.   On: 11/26/2012 14:51   Imaging Review No results found.  EKG Interpretation   None       MDM  No diagnosis found. 1. Abdominal pain 2. Colon cancer  He feels better with medication. He reports that this is pain that he relates to his cancer pain but was more intense. He continues to produce stool into colostomy and is tolerating tube feedings without difficulty. He is comfortable with discharge home. No obvious new or acute finding on plain films, lab studies stable. Discussed with  Dr. Juleen China. Stable for discharge.     Arnoldo Hooker, PA-C 12/11/12 0910  Arnoldo Hooker, PA-C 12/28/12 [phone]

## 2012-12-12 NOTE — ED Provider Notes (Signed)
Medical screening examination/treatment/procedure(s) were performed by non-physician practitioner and as supervising physician I was immediately available for consultation/collaboration.  EKG Interpretation   None        Raeford Razor, MD 12/12/12 [phone]

## 2012-12-13 ENCOUNTER — Telehealth: Payer: Self-pay | Admitting: Oncology

## 2012-12-13 ENCOUNTER — Ambulatory Visit (HOSPITAL_BASED_OUTPATIENT_CLINIC_OR_DEPARTMENT_OTHER): Payer: Medicare Other | Admitting: Nurse Practitioner

## 2012-12-13 VITALS — BP 132/92 | HR 115 | Temp 97.0°F | Resp 18 | Ht 64.0 in | Wt 95.2 lb

## 2012-12-13 DIAGNOSIS — C20 Malignant neoplasm of rectum: Secondary | ICD-10-CM

## 2012-12-13 DIAGNOSIS — C7800 Secondary malignant neoplasm of unspecified lung: Secondary | ICD-10-CM

## 2012-12-13 DIAGNOSIS — G620 Drug-induced polyneuropathy: Secondary | ICD-10-CM

## 2012-12-13 MED ORDER — MORPHINE SULFATE ER 60 MG PO TBCR: 60.0000 mg | EXTENDED_RELEASE_TABLET | Freq: Two times a day (BID) | ORAL | Status: DC

## 2012-12-13 MED ORDER — DIPHENOXYLATE-ATROPINE 2.5-0.025 MG PO TABS: 1.0000 | ORAL_TABLET | Freq: Four times a day (QID) | ORAL | Status: AC | PRN

## 2012-12-13 MED ORDER — OXYCODONE-ACETAMINOPHEN 5-325 MG PO TABS: 1.0000 | ORAL_TABLET | ORAL | Status: DC | PRN

## 2012-12-13 MED ORDER — DIPHENOXYLATE-ATROPINE 2.5-0.025 MG PO TABS: 1.0000 | ORAL_TABLET | Freq: Four times a day (QID) | ORAL | Status: DC | PRN

## 2012-12-13 NOTE — Telephone Encounter (Signed)
gv and printed appt sched and avs for pt for NOV °

## 2012-12-13 NOTE — Progress Notes (Signed)
OFFICE PROGRESS NOTE  Interval history:  [name] is a 52 year old man with metastatic rectal cancer. He is followed by hospice. He is seen today for scheduled followup.  He was seen in emergency Department on 12/11/2012 for evaluation of abdominal pain. He states that he "ran out" of pain medication". As long as he takes his pain medication on a scheduled basis, pain is overall well-controlled. He is currently on MS Contin 60 mg every 12 hours with Percocet as needed. Over the past week and a half he has been having diarrhea. He is emptying the ostomy bag multiple times a day. He is taking an over-the-counter antidiarrheal medication with no improvement. He continues to lose weight. He reports a good appetite.   Objective: Blood pressure 132/92, pulse 115, temperature 97 F (36.1 C), temperature source Oral, resp. rate 18, height 5\' 4"  (1.626 m), weight 95 lb 3.2 oz (43.182 kg).  No thrush or ulceration. Lungs clear. Regular cardiac rhythm. Tachycardic. Port-A-Cath site is without erythema. No hepatomegaly. Left upper quadrant G-tube. Palpable mass right lower abdomen. No leg edema. Persistent 2-3 center mass at the right upper buttock.  Lab Results: Lab Results  Component Value Date   WBC 11.1* 12/11/2012   HGB 10.2* 12/11/2012   HCT 31.6* 12/11/2012   MCV 86.8 12/11/2012   PLT 354 12/11/2012    Chemistry:    Chemistry      Component Value Date/Time   NA 133* 12/11/2012 0630   NA 140 08/22/2012 1152   K 3.3* 12/11/2012 0630   K 3.8 08/22/2012 1152   CL 97 12/11/2012 0630   CL 104 07/26/2012 0744   CO2 27 12/11/2012 0630   CO2 29 08/22/2012 1152   BUN 21 12/11/2012 0630   BUN 15.3 08/22/2012 1152   CREATININE 0.58 12/11/2012 0630   CREATININE 1.1 08/22/2012 1152      Component Value Date/Time   CALCIUM 8.9 12/11/2012 0630   CALCIUM 9.4 08/22/2012 1152   ALKPHOS 61 12/11/2012 0630   ALKPHOS 62 08/22/2012 1152   AST 14 12/11/2012 0630   AST 16 08/22/2012 1152   ALT 10 12/11/2012 0630    ALT 12 08/22/2012 1152   BILITOT 0.3 12/11/2012 0630   BILITOT 0.60 08/22/2012 1152       Studies/Results: Ct Abdomen Pelvis W Contrast  11/23/2012   *RADIOLOGY REPORT*  Clinical Data: Upper abdominal pain for 1 day.  CT ABDOMEN AND PELVIS WITH CONTRAST  Technique:  Multidetector CT imaging of the abdomen and pelvis was performed following the standard protocol during bolus administration of intravenous contrast.  Contrast:  100 mL of Omnipaque 300 IV contrast  Comparison: CT of the abdomen and pelvis performed 09/28/2012  Findings: Innumerable nodules are noted throughout the lung bases, compatible with metastatic disease to the lungs.  The liver is unremarkable in appearance.  An unusual mass-like density along the lateral aspect of the spleen may reflect a focal metastasis, or simply a fold in the diaphragm.  The spleen itself is unremarkable in appearance.  The gallbladder is grossly unremarkable.  The pancreas and adrenal glands are within normal limits.  A 1.4 cm cyst is noted arising at the anterior aspect of the right kidney.  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  The upper pole moiety is unremarkable in appearance.  The kidneys are otherwise grossly unremarkable.  No significant perinephric stranding is seen,  though evaluation is suboptimal due to underlying diffuse anasarca.   No renal or ureteral stones are seen.  Bilateral ureteral jets are noted on delayed imaging.  There is dilatation of small bowel loops to 4.8 cm in maximal diameter, and dilatation of the colon to 6.7 cm.  This appears to reflect some degree of distal obstruction due to apparent partially calcified mass within the pelvis, and also due to the irregular mass surrounding the anorectal canal, measuring perhaps 6.3 x 4.2 cm.  Additional nodules are noted within the subcutaneous soft tissues, including to the  right of the umbilicus (measuring 3.0 cm and 3.5 cm) and posterior to the right hip joint (measuring 3.1 cm).  The stomach is within normal limits.  No acute vascular abnormalities are seen. Scattered calcification is noted along the abdominal aorta and its branches.  The appendix is normal in caliber and contains air, without evidence for appendicitis.  The bladder is mildly distended and grossly unremarkable in appearance.  The prostate remains normal in size.  No inguinal lymphadenopathy is seen.  No acute osseous abnormalities are identified.  IMPRESSION:  1.  Dilatation of small bowel loops to 4.8 cm in maximal diameter, and dilatation of the colon to 6.7 cm.  This may reflect dysmotility, or partial distal obstruction due to the partially calcified mass within the pelvis, and also due to an apparent irregular mass surrounding the anorectal canal, measuring perhaps 6.3 x 4.2 cm. 2.  Additional metastatic nodules noted within the soft tissues of the abdomen and pelvis, as described above. 3.  Innumerable nodules throughout the lung bases, compatible with metastatic disease to the lungs. 4.  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  Bilateral ureteral jets are seen. 5.  Diffuse soft tissue edema, compatible with anasarca.   Original Report Authenticated By: Tonia Ghent, M.D.   Dg Abd Acute W/chest  12/11/2012   CLINICAL DATA:  Abdominal/back pain, metastatic colon cancer  EXAM: ACUTE ABDOMEN SERIES (ABDOMEN 2 VIEW & CHEST 1 VIEW)  COMPARISON:  Chest/abdominal radiographs dated 11/26/2012. CT abdomen pelvis dated 11/23/2012.  FINDINGS: Multiple pulmonary nodules/masses measuring up to 3.3 cm in the right mid/lower lung. No pleural effusion or pneumothorax.  The heart is normal in size.  Stable right chest power port.  Dilated loops of small bowel in the mid abdomen measuring up to 5.1 cm. However, the colon is also mildly prominent.  Multiple air-fluid levels on the upright view. These findings are similar to the prior study and may reflect adynamic ileus or possibly distal colonic obstruction.  No evidence of free air under the diaphragm on the upright view.  Degenerative changes of the lumbar spine.  IMPRESSION: Multiple pulmonary metastases.  Dilated loops of small bowel and colon, grossly unchanged, possibly reflecting adynamic ileus or distal colonic obstruction.  No free air.   Electronically Signed   By: Charline Bills M.D.   On: 12/11/2012 08:02   Dg Abd Acute W/chest  11/26/2012   CLINICAL DATA:  Abdominal pain.  EXAM: ACUTE ABDOMEN SERIES (ABDOMEN 2 VIEW & CHEST 1 VIEW)  COMPARISON:  Multiple priors, most recently abdominal radiograph 10/02/2012.  FINDINGS: Innumerable pulmonary nodules and masses are noted, compatible with widespread metastatic disease to the lungs. The largest of these measures up to 3.2 x 2.5 cm in diameter in the right mid lung. No definite acute consolidative airspace disease. No pleural effusions. No evidence of pulmonary edema. Heart size is  normal. Upper mediastinal contours are within normal limits. Right subclavian single-lumen power porta cath with tip terminating at the superior cavoatrial junction.  Supine and upright views of the abdomen demonstrate a extensive gas and stool scattered throughout the colon. There is a paucity of distal rectal gas noted. Numerous dilated loops of small bowel are noted, with multiple air-fluid levels on the upright projection. No frank pneumoperitoneum is confidently identified at this time. Suture lines are noted in the left upper quadrant of the abdomen and in the low anatomic pelvis, likely related to prior bowel resection.  IMPRESSION: 1. Bowel gas pattern suggests early or partial small bowel obstruction, as above. 2. Widespread metastatic disease to the lungs redemonstrated. 3. No frank pneumoperitoneum at this time.   Electronically Signed   By: Trudie Reed M.D.   On: 11/26/2012 14:51    Medications: I have reviewed the patient's current medications.  Assessment/Plan:  1. Clinical stage III (uT3 uN1) rectal cancer status post pelvic radiation, August 24 through November 18, 2009. 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 There was no lymph node involvement (pT3 pN0). 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 He began adjuvant CAPOX chemotherapy March 11, 2010. He completed the sixth and final cycle of CAPOX 06/25/2010. CT of the abdomen and pelvis 03/11/2011 confirmed multiple nodules at the lung bases consistent with metastatic disease. 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 He completed cycle 1 of FOLFIRI/Avastin 04/29/2011. He completed cycle 3 05/27/2011. CEA was improved on 06/10/2011. He completed cycle 4 on 06/10/2011. He completed cycle 5 on 06/24/2011. He completed cycle 6 on 07/08/2011. Restaging CT evaluation 07/22/2011 showed a slight decrease in pulmonary nodules. He completed cycle 8 FOLFIRI/Avastin on 08/12/2011. He completed cycle 9 on 09/02/2011. Chest CT on 10/07/2011 showed increased size and number of lung nodules. CT scans 12/13/2011 with mild progression of pulmonary metastatic disease; interval resolution of left upper quadrant and central pelvic fluid collections; residual gas and fluid collection in the undersurface of the left lower quadrant ventral abdominal wall; similar appearance of soft tissue thickening and enhancement to the perirectal region; abnormal small bowel dilatation and moderate colonic stool burden consistent with ileus and/or constipation. He completed cycle 1 FOLFOXIRI on 01/12/2012; cycle 4  on 02/24/2012.The CEA was slightly lower on 02/24/2012. Restaging CT on 03/09/2012 without evidence of disease progression. He completed cycle 5 on 03/28/2012. The CEA was more elevated on 04/10/2012 (22.9 ). He completed cycle 6 FOLFOXIRI on 04/12/2012 ,cycle 7 on 04/26/2012, and cycle 8 on 05/10/2012. CEA was improved on 05/10/2012. Restaging CT 05/19/2012 revealed stable lung nodules and stable postoperative changes in the abdomen/pelvis, stable CEA 06/14/2012. CEA stable on 06/28/2012. He completed cycle 12 FOLFOXIRI on 07/26/2012. -Restaging CT 08/03/2012 with enlargement of some of the lung nodules, increased perirectal mass, increased size of a splenic lesion. Irinotecan and oxaliplatin were discontinued. Systemic therapy continued with 5-FU/leucovorin.  -CT 08/22/2012 showed interval progression of central right lower lobe tumor involving the right lower lobe bronchus and mild increase in a splenic lesion. Status post palliative radiation to both areas 08/31/2012 through 09/20/2012.  2. Strangulated peristomal hernia and small bowel obstruction status post reduction of peristomal hernia, omentectomy, exploratory laparotomy, small bowel resection x2 on January 28, 2010.  3. History of rectal bleeding  secondary to number 1.  4. History of an elevated CEA. Improved on 06/10/2011 and 07/29/2011. 5. History of a left adrenal nodule.  6. Weight loss. Improved. 7. Right knee pain/effusion status post evaluation at [facility].  8. CT abdomen/pelvis 03/11/2011 with multiple nodules at the lung bases consistent with metastatic involvement of the lungs; prominent presacral soft tissue; air bubble within the presacral soft tissue appeared to be extraluminal and breakdown at the anastomotic site could not be excluded. No discrete abscess was seen. 9. Elevated blood pressure. Question effect of Avastin. He was started on Norvasc on 06/10/2011.  10. Frequent bowel movements. Likely related to  multiple bowel resections. Imodium was not effective. He is taking Lomotil. 11. Suture at the ileostomy reversal scar. Suture removed during the recent hospitalization. 12. Erectile dysfunction. The testosterone level returned at 508 on 08/12/2011, he was referred to urology, but Medicaid would not pay for this visit. 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 Culture returned positive for abundant Escherichia coli. He completed a course of Augmentin.  14. Status post diverting loop descending colostomy 10/05/2011. 15. Status post multiple rectal biopsies 10/05/2011, all negative for malignancy. 16. Hospitalization 10/13/2011 with a bowel obstruction. Resolved. 17. Hospitalization 11/07/2011 with a recurrent small bowel obstruction. He was taken to the OR on 11/10/2011 and underwent an exploratory laparotomy with small bowel resection x2 and incidental appendectomy. He was found to have a closed loop obstruction. Pathology on #1 small bowel resection showed focal perforation with no evidence of malignancy; pathology on #2 small bowel resection showed metastatic adenocarcinoma consistent with colorectal primary; pathology on the appendix showed serosal fibrous adhesions with no evidence of malignancy.He developed postoperative respiratory failure, sepsis and renal insufficiency. He improved gradually and was able to be extubated. He required placement of multiple percutaneous drains for intraperitoneal abscesses. He was discharged home on 12/02/2011. No remaining abscess on the CT 03/09/2012. The pelvic drain has been removed. 18. Deconditioning. Improved. 19. Low back and testicular pain when supine, tenderness of the right testicle. Improved. He did not complain of this pain at today's visit. 20. Diarrhea following cycle 1 FOLFOXIRI. 21. Pain at previous pelvic drain site, ileostomy scar and the lower aspect of the midline scar. He takes Percocet as needed. ?  neuropathic pain. 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 23. Acute onset pleuritic chest pain 08/22/2012. The pain is likely related to a pleural-based versus a splenic mass. He completed a course of palliative radiation 08/31/2012 through 09/20/2012. The pain is better.  24. Hospitalization 09/28/2012 through 10/09/2012 with bowel obstruction status post exploratory laparotomy and intestinal bypass of closed loop obstruction to the transverse colon, placement of a gastrostomy tube. 25. Painful subcutaneous mass right buttock, status post palliative radiation completed 11/13/2012.  Disposition-[name]'s performance status is slowly declining. His pain overall is well controlled. He will continue MS Contin and Percocet. For the diarrhea he was prescribed Lomotil 1 tablet 4 times daily as needed. He will contact the office if this is not effective. We will see him back in approximately one month. As noted above he is enrolled in the [facility] hospice program.  Plan reviewed with Dr. Truett Perna.  Lonna Cobb ANP/GNP-BC

## 2012-12-14 ENCOUNTER — Emergency Department (HOSPITAL_COMMUNITY)
Admission: EM | Admit: 2012-12-14 | Discharge: 2012-12-14 | Disposition: A | Discharge status: Home / Self Care | Attending: Emergency Medicine | Admitting: Emergency Medicine

## 2012-12-14 ENCOUNTER — Encounter (HOSPITAL_COMMUNITY): Payer: Self-pay | Admitting: Emergency Medicine

## 2012-12-14 DIAGNOSIS — C787 Secondary malignant neoplasm of liver and intrahepatic bile duct: Secondary | ICD-10-CM | POA: Insufficient documentation

## 2012-12-14 DIAGNOSIS — M549 Dorsalgia, unspecified: Secondary | ICD-10-CM | POA: Insufficient documentation

## 2012-12-14 DIAGNOSIS — R109 Unspecified abdominal pain: Secondary | ICD-10-CM

## 2012-12-14 DIAGNOSIS — G8929 Other chronic pain: Secondary | ICD-10-CM | POA: Insufficient documentation

## 2012-12-14 DIAGNOSIS — Z923 Personal history of irradiation: Secondary | ICD-10-CM | POA: Insufficient documentation

## 2012-12-14 DIAGNOSIS — Z79899 Other long term (current) drug therapy: Secondary | ICD-10-CM | POA: Insufficient documentation

## 2012-12-14 DIAGNOSIS — Z931 Gastrostomy status: Secondary | ICD-10-CM | POA: Insufficient documentation

## 2012-12-14 DIAGNOSIS — Z932 Ileostomy status: Secondary | ICD-10-CM | POA: Insufficient documentation

## 2012-12-14 DIAGNOSIS — Z85048 Personal history of other malignant neoplasm of rectum, rectosigmoid junction, and anus: Secondary | ICD-10-CM | POA: Insufficient documentation

## 2012-12-14 DIAGNOSIS — I10 Essential (primary) hypertension: Secondary | ICD-10-CM | POA: Insufficient documentation

## 2012-12-14 DIAGNOSIS — C189 Malignant neoplasm of colon, unspecified: Secondary | ICD-10-CM | POA: Insufficient documentation

## 2012-12-14 DIAGNOSIS — R1084 Generalized abdominal pain: Secondary | ICD-10-CM | POA: Insufficient documentation

## 2012-12-14 DIAGNOSIS — F17200 Nicotine dependence, unspecified, uncomplicated: Secondary | ICD-10-CM | POA: Insufficient documentation

## 2012-12-14 MED ORDER — SODIUM CHLORIDE 0.9 % IV BOLUS (SEPSIS)
500.0000 mL | Freq: Once | INTRAVENOUS | Status: AC
  Administered 2012-12-14: 500 mL via INTRAVENOUS

## 2012-12-14 MED ORDER — HYDROMORPHONE HCL PF 1 MG/ML IJ SOLN
0.5000 mg | Freq: Once | INTRAMUSCULAR | Status: AC
  Administered 2012-12-14: 0.5 mg via INTRAVENOUS
  Filled 2012-12-14: qty 1

## 2012-12-14 MED ORDER — HEPARIN SOD (PORK) LOCK FLUSH 100 UNIT/ML IV SOLN
500.0000 [IU] | Freq: Once | INTRAVENOUS | Status: AC
  Administered 2012-12-14: 500 [IU]
  Filled 2012-12-14: qty 5

## 2012-12-14 MED ORDER — HYDROMORPHONE HCL PF 1 MG/ML IJ SOLN
1.0000 mg | Freq: Once | INTRAMUSCULAR | Status: AC
  Administered 2012-12-14: 1 mg via INTRAVENOUS
  Filled 2012-12-14: qty 1

## 2012-12-14 NOTE — ED Notes (Signed)
Bed: WA02 Expected date: 12/14/12 Expected time: 7:41 PM Means of arrival: Ambulance Comments: Colon ca

## 2012-12-14 NOTE — ED Provider Notes (Signed)
CSN: [id number]     Arrival date & time 12/14/12  1951 History   First MD Initiated Contact with Patient 12/14/12 1957     Chief Complaint  Patient presents with  . Abdominal Pain  . Back Pain   (Consider location/radiation/quality/duration/timing/severity/associated sxs/prior Treatment) Patient is a 52 y.o. male presenting with abdominal pain and back pain. The history is provided by the patient. No language interpreter was used.  Abdominal Pain Pain location:  Generalized Pain quality: aching   Associated symptoms: no chills, no fever, no nausea and no vomiting   Associated symptoms comment:  The patient has a history of end stage metastatic colon cancer, s/p ileostomy, s/p G-tube placement with continuation of oral feeds, who has chronic abdominal pain. No fever, vomiting. He reports regular stool into his ileostomy bag that are non-bloody. He feels this is pain that is his usual pain without change but that his regular medications are no longer controlling his pain. He has a history of bowel obstruction but states that this is not similar. Back Pain Associated symptoms: abdominal pain   Associated symptoms: no fever and no weakness     Past Medical History  Diagnosis Date  . Hypertension     no meds  . Cancer     rectal   . 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 S/P radiation therapy 9-23 to 11-13-12                                                          R buttock nodule / 20 Gy in 5 fractions                          Past Surgical History  Procedure Laterality Date  . Multiple tooth extractions      9/11  . Portacath placement  04/12/2011    Procedure: INSERTION PORT-A-CATH;  Surgeon: Almond Lint, MD;  Location: [facility];  Service: General;  Laterality: N/A;  port a cath insertion  . Colon surgery  12/11    sbo  . Colon surgery   8/12    colon resection-iliostomy  . Colon surgery  2012    iliostomy takedown  . Colostomy  10/05/2011    Procedure: COLOSTOMY;  Surgeon: Almond Lint, MD;  Location: [facility] OR;  Service: General;  Laterality: N/A;  open colostomy  . Examination under anesthesia  10/05/2011    Procedure: EXAM UNDER ANESTHESIA;  Surgeon: Almond Lint, MD;  Location: [facility] OR;  Service: General;  Laterality: N/A;  exam under anesthesia  . Proctoscopy  10/05/2011    Procedure: PROCTOSCOPY;  Surgeon: Almond Lint, MD;  Location: [facility] OR;  Service: General;  Laterality: N/A;  rigid proctoscopy  . Laparoscopy  10/05/2011    Procedure: LAPAROSCOPY DIAGNOSTIC;  Surgeon: Almond Lint, MD;  Location: [facility] OR;  Service: General;  Laterality: N/A;  . Laparotomy  11/10/2011    Procedure: EXPLORATORY LAPAROTOMY;  Surgeon: Almond Lint, MD;  Location: [facility];  Service: General;  Laterality: N/A;  SMALL BOWEL RESECTION x 2  . Application of wound vac  11/10/2011    Procedure: APPLICATION OF WOUND VAC;  Surgeon:  Almond Lint, MD;  Location: [facility];  Service: General;;  . Laparotomy N/A 10/04/2012    Procedure: EXPLORATORY LAPAROTOMY, ;  Surgeon: Clovis Pu. Cornett, MD;  Location: [facility];  Service: General;  Laterality: N/A;  GTUBE PLACEMENT   Family History  Problem Relation Age of Onset  . Cancer Mother     lung  . Cancer Paternal Uncle    History  Substance Use Topics  . Smoking status: Current Every Day Smoker -- 0.25 packs/day for 12 years    Types: Cigarettes  . Smokeless tobacco: Never Used  . Alcohol Use: No     Comment: pt states has not drank in approx 1 month    Review of Systems  Constitutional: Negative for fever and chills.  Respiratory: Negative.   Cardiovascular: Negative.   Gastrointestinal: Positive for abdominal pain. Negative for nausea, vomiting and blood in stool.  Genitourinary: Negative.   Musculoskeletal: Positive for back pain.  Skin: Negative.   Neurological: Negative.  Negative for weakness.     Allergies  Review of patient's allergies indicates no known allergies.  Home Medications   Current Outpatient Rx  Name  Route  Sig  Dispense  Refill  . amLODipine (NORVASC) 10 MG tablet   Oral   Take 1 tablet (10 mg total) by mouth daily.   30 tablet   3     HOSPICE PATIENT   . diphenoxylate-atropine (LOMOTIL) 2.5-0.025 MG per tablet   Oral   Take 1 tablet by mouth 4 (four) times daily as needed for diarrhea or loose stools.   30 tablet   0   . HYDROmorphone (DILAUDID) 2 MG tablet   Oral   Take 1 tablet (2 mg total) by mouth every 4 (four) hours as needed for pain (For breakthrough pain).   20 tablet   0   . LORazepam (ATIVAN) 0.5 MG tablet   Oral   Take 0.5-1 mg by mouth at bedtime as needed (sleep).         . morphine (MS CONTIN) 60 MG 12 hr tablet   Oral   Take 1 tablet (60 mg total) by mouth 2 (two) times daily.   60 tablet   0   . oxyCODONE-acetaminophen (PERCOCET/ROXICET) 5-325 MG per tablet   Oral   Take 1 tablet by mouth every 4 (four) hours as needed for pain.   100 tablet   0   . prochlorperazine (COMPAZINE) 10 MG tablet   Oral   Take 10 mg by mouth every 6 (six) hours as needed (nausea).       0    BP 149/61  Pulse 118  Temp(Src) 97.9 F (36.6 C) (Oral)  Resp 16  SpO2 100% Physical Exam  Constitutional: He is oriented to person, place, and time. He appears well-developed and well-nourished.  HENT:  Head: Normocephalic.  Neck: Normal range of motion. Neck supple.  Cardiovascular: Normal rate and regular rhythm.   Pulmonary/Chest: Effort normal and breath sounds normal.  Abdominal: Soft. Bowel sounds are normal. There is tenderness. There is no rebound and no guarding.  Diffusely tender. Ileostomy in place, site unremarkable. G-tube present, site unremarkable. Multiple well-healed surgical scars over abdominal wall. Mass palpable in RLQ abdomen.   Musculoskeletal: Normal range of motion.  Neurological: He is alert and oriented to  person, place, and time.  Skin: Skin is warm and dry. No rash noted.  Psychiatric: He has a normal mood and affect.    ED Course  Procedures (including  critical care time) Labs Review Labs Reviewed - No data to display Imaging Review No results found.  EKG Interpretation   None       MDM  No diagnosis found. 1. Chronic abdominal pain 2. Metastatic colon cancer  He feels this is his chronic pain, uncontrolled with current medications, but unchanged from daily pain. He has a history of obstruction, however, feels this is not pain associated with obstruction. VSS. Dr. Criss Alvine involved in patient evaluation and care plan. The patient feels confident that if his pain is controlled in ED, he can and wants to return home without further evaluation.   Pain is managed. He reports he is ready to go home.   Arnoldo Hooker, PA-C 12/14/12 2315

## 2012-12-14 NOTE — ED Notes (Signed)
Pt wth end stage colon ca. Pt comes into ED per ems d/t abd pain and back pain. Pt states this has been ongoing for about 1 month, but got worse tonight about 1 hour ago.

## 2012-12-15 ENCOUNTER — Ambulatory Visit: Payer: Medicare Other | Admitting: Radiation Oncology

## 2012-12-15 NOTE — ED Provider Notes (Signed)
Medical screening examination/treatment/procedure(s) were conducted as a shared visit with non-physician practitioner(s) and myself.  I personally evaluated the patient during the encounter.  EKG Interpretation   None       Patient with hospice for colon cancer with progressive worsening of chronic pain. He has had multiple obstructions and states this does not feel like those and he does not want to pursue w/u. Will control pain and discharge.   Audree Camel, MD 12/15/12 [phone]

## 2012-12-22 ENCOUNTER — Telehealth: Payer: Self-pay | Admitting: *Deleted

## 2012-12-22 DIAGNOSIS — C20 Malignant neoplasm of rectum: Secondary | ICD-10-CM

## 2012-12-22 MED ORDER — HYDROMORPHONE HCL 2 MG PO TABS: ORAL_TABLET | ORAL | Status: DC

## 2012-12-22 MED ORDER — LORAZEPAM 0.5 MG PO TABS: 0.5000 mg | ORAL_TABLET | Freq: Every evening | ORAL | Status: DC | PRN

## 2012-12-22 NOTE — Telephone Encounter (Signed)
NOTIFIED MANDY THAT MEDICATION REFILLS WILL BE SENT TO [facility] ON [address].

## 2012-12-28 NOTE — ED Provider Notes (Signed)
Medical screening examination/treatment/procedure(s) were performed by non-physician practitioner and as supervising physician I was immediately available for consultation/collaboration.  EKG Interpretation   None        Raeford Razor, MD 12/28/12 1002

## 2013-01-02 ENCOUNTER — Encounter (HOSPITAL_COMMUNITY): Payer: Self-pay | Admitting: Emergency Medicine

## 2013-01-02 ENCOUNTER — Emergency Department (HOSPITAL_COMMUNITY)
Admission: EM | Admit: 2013-01-02 | Discharge: 2013-01-02 | Disposition: A | Discharge status: Home / Self Care | Attending: Emergency Medicine | Admitting: Emergency Medicine

## 2013-01-02 ENCOUNTER — Emergency Department (HOSPITAL_COMMUNITY)

## 2013-01-02 ENCOUNTER — Other Ambulatory Visit: Payer: Self-pay | Admitting: *Deleted

## 2013-01-02 DIAGNOSIS — E860 Dehydration: Secondary | ICD-10-CM | POA: Insufficient documentation

## 2013-01-02 DIAGNOSIS — I10 Essential (primary) hypertension: Secondary | ICD-10-CM | POA: Insufficient documentation

## 2013-01-02 DIAGNOSIS — E876 Hypokalemia: Secondary | ICD-10-CM | POA: Insufficient documentation

## 2013-01-02 DIAGNOSIS — C20 Malignant neoplasm of rectum: Secondary | ICD-10-CM

## 2013-01-02 DIAGNOSIS — R109 Unspecified abdominal pain: Secondary | ICD-10-CM

## 2013-01-02 DIAGNOSIS — Z79899 Other long term (current) drug therapy: Secondary | ICD-10-CM | POA: Insufficient documentation

## 2013-01-02 DIAGNOSIS — C7800 Secondary malignant neoplasm of unspecified lung: Secondary | ICD-10-CM | POA: Insufficient documentation

## 2013-01-02 DIAGNOSIS — R110 Nausea: Secondary | ICD-10-CM | POA: Insufficient documentation

## 2013-01-02 DIAGNOSIS — F17200 Nicotine dependence, unspecified, uncomplicated: Secondary | ICD-10-CM | POA: Insufficient documentation

## 2013-01-02 DIAGNOSIS — E878 Other disorders of electrolyte and fluid balance, not elsewhere classified: Secondary | ICD-10-CM

## 2013-01-02 DIAGNOSIS — E871 Hypo-osmolality and hyponatremia: Secondary | ICD-10-CM | POA: Insufficient documentation

## 2013-01-02 DIAGNOSIS — R7989 Other specified abnormal findings of blood chemistry: Secondary | ICD-10-CM | POA: Insufficient documentation

## 2013-01-02 DIAGNOSIS — R1084 Generalized abdominal pain: Secondary | ICD-10-CM | POA: Insufficient documentation

## 2013-01-02 LAB — COMPREHENSIVE METABOLIC PANEL
ALT: 8 U/L (ref 0–53)
AST: 12 U/L (ref 0–37)
CO2: 31 mEq/L (ref 19–32)
Chloride: 91 mEq/L — ABNORMAL LOW (ref 96–112)
Creatinine, Ser: 0.88 mg/dL (ref 0.50–1.35)
GFR calc Af Amer: >90 mL/min (ref >90)
GFR calc non Af Amer: >90 mL/min (ref >90)
Glucose, Bld: 108 mg/dL — ABNORMAL HIGH (ref 70–99)
Sodium: 130 mEq/L — ABNORMAL LOW (ref 135–145)
Total Bilirubin: 0.7 mg/dL (ref 0.3–1.2)

## 2013-01-02 LAB — CBC WITH DIFFERENTIAL/PLATELET
Basophils Absolute: 0 10*3/uL (ref 0.0–0.1)
Basophils Relative: 0 % (ref 0–1)
Eosinophils Relative: 0 % (ref 0–5)
HCT: 27 % — ABNORMAL LOW (ref 39.0–52.0)
Hemoglobin: 8.9 g/dL — ABNORMAL LOW (ref 13.0–17.0)
Lymphs Abs: 0.5 10*3/uL — ABNORMAL LOW (ref 0.7–4.0)
MCH: 28.5 pg (ref 26.0–34.0)
MCV: 86.5 fL (ref 78.0–100.0)
Monocytes Absolute: 0.9 10*3/uL (ref 0.1–1.0)
Monocytes Relative: 14 % — ABNORMAL HIGH (ref 3–12)
Platelets: 456 10*3/uL — ABNORMAL HIGH (ref 150–400)
RBC: 3.12 MIL/uL — ABNORMAL LOW (ref 4.22–5.81)
RDW: 15.7 % — ABNORMAL HIGH (ref 11.5–15.5)
WBC: 6.3 10*3/uL (ref 4.0–10.5)

## 2013-01-02 MED ORDER — MORPHINE SULFATE ER 60 MG PO TBCR: 60.0000 mg | EXTENDED_RELEASE_TABLET | Freq: Two times a day (BID) | ORAL | Status: DC

## 2013-01-02 MED ORDER — HYDROMORPHONE HCL PF 1 MG/ML IJ SOLN
1.0000 mg | Freq: Once | INTRAMUSCULAR | Status: AC
  Administered 2013-01-02: 1 mg via INTRAVENOUS
  Filled 2013-01-02: qty 1

## 2013-01-02 MED ORDER — SODIUM CHLORIDE 0.9 % IV SOLN
1000.0000 mL | Freq: Once | INTRAVENOUS | Status: AC
  Administered 2013-01-02: 1000 mL via INTRAVENOUS

## 2013-01-02 MED ORDER — OXYCODONE-ACETAMINOPHEN 5-325 MG PO TABS: 1.0000 | ORAL_TABLET | ORAL | Status: DC | PRN

## 2013-01-02 MED ORDER — ONDANSETRON HCL 4 MG/2ML IJ SOLN
4.0000 mg | Freq: Once | INTRAMUSCULAR | Status: AC
  Administered 2013-01-02: 4 mg via INTRAVENOUS
  Filled 2013-01-02: qty 2

## 2013-01-02 MED ORDER — HEPARIN SOD (PORK) LOCK FLUSH 100 UNIT/ML IV SOLN
500.0000 [IU] | Freq: Once | INTRAVENOUS | Status: AC
  Administered 2013-01-02: 500 [IU]
  Filled 2013-01-02: qty 5

## 2013-01-02 MED ORDER — MORPHINE SULFATE 2 MG/ML IJ SOLN
2.0000 mg | Freq: Once | INTRAMUSCULAR | Status: AC
  Administered 2013-01-02: 2 mg via INTRAVENOUS
  Filled 2013-01-02: qty 1

## 2013-01-02 MED ORDER — SODIUM CHLORIDE 0.9 % IV SOLN
1000.0000 mL | INTRAVENOUS | Status: DC
  Administered 2013-01-02: 1000 mL via INTRAVENOUS

## 2013-01-02 NOTE — ED Notes (Signed)
Patient with history of colon cancer c/o lower abdominal pain.  Patient reports he gets constipated every several weeks.

## 2013-01-02 NOTE — Telephone Encounter (Signed)
Pt came in to office after ED visit requesting refill on pain medications. Same done. He reports he needs to cancel next week's office visit. Plans to be in [place] with his grandchildren. Refills given for Oxycodone and MS Contin. Pt stated he did not have a way to get home. Taxi voucher provided. Pt given Rx for #100 Percocet tablets. (Rx for #30 Percocet shredded.)

## 2013-01-02 NOTE — ED Notes (Signed)
Bed: WA06 Expected date:  Expected time:  Means of arrival:  Comments: EMS/hx cancer

## 2013-01-02 NOTE — ED Notes (Signed)
MD at bedside.  EDP  Iva Knapp present to evaluate for discharge

## 2013-01-02 NOTE — ED Provider Notes (Signed)
CSN: [id number]     Arrival date & time 01/02/13  2130 History   First MD Initiated Contact with Patient 01/02/13 0750     Chief Complaint  Patient presents with  . 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 He has been seen in the ED frequently for intractable pain. He reports he did have constipation however he states it is gone. He states he was having diarrhea however he was given Imodium and now he states he hasn't had a bowel movement since 4 days ago. He also has not eaten since 4 days ago. He has nausea without vomiting. He states he could need however he's afraid he could see was having the diarrhea. He denies any fever. He states he is taking more of his pain medicine than scheduled.  Oncologist Dr Truett Perna   DO NOT RESUSCITATE   Past Medical History  Diagnosis Date  . Hypertension     no meds  . Cancer     rectal   . 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 S/P radiation therapy 9-23 to 11-13-12                                                          R buttock nodule / 20 Gy in 5 fractions                          Past Surgical History  Procedure Laterality Date  . Multiple tooth extractions      9/11  . Portacath placement  04/12/2011    Procedure: INSERTION PORT-A-CATH;  Surgeon: Almond Lint, MD;  Location: [facility];  Service: General;  Laterality: N/A;  port a cath insertion  . Colon surgery  12/11    sbo  . Colon surgery  8/12    colon resection-iliostomy  . Colon surgery  2012    iliostomy takedown  . Colostomy  10/05/2011    Procedure: COLOSTOMY;  Surgeon: Almond Lint, MD;  Location: [facility];  Service: General;  Laterality: N/A;  open  colostomy  . Examination under anesthesia  10/05/2011    Procedure: EXAM UNDER ANESTHESIA;  Surgeon: Almond Lint, MD;  Location: [facility];  Service: General;  Laterality: N/A;  exam under anesthesia  . Proctoscopy  10/05/2011    Procedure: PROCTOSCOPY;  Surgeon: Almond Lint, MD;  Location: [facility];  Service: General;  Laterality: N/A;  rigid proctoscopy  . Laparoscopy  10/05/2011    Procedure: LAPAROSCOPY DIAGNOSTIC;  Surgeon: Almond Lint, MD;  Location: [facility];  Service: General;  Laterality: N/A;  . Laparotomy  11/10/2011    Procedure: EXPLORATORY LAPAROTOMY;  Surgeon: Almond Lint, MD;  Location: [facility];  Service: General;  Laterality: N/A;  SMALL BOWEL RESECTION x 2  . Application of wound vac  11/10/2011    Procedure: APPLICATION OF WOUND VAC;  Surgeon: Almond Lint, MD;  Location: [facility];  Service: General;;  . Laparotomy N/A 10/04/2012    Procedure: EXPLORATORY LAPAROTOMY, ;  Surgeon: Clovis Pu. Cornett, MD;  Location: [facility];  Service: General;  Laterality: N/A;  GTUBE PLACEMENT   Family History  Problem Relation Age of Onset  . Cancer Mother     lung  . Cancer Paternal Uncle    History  Substance Use Topics  . Smoking status: Current Every Day Smoker -- 0.25 packs/day for 12 years    Types: Cigarettes  . Smokeless tobacco: Never Used  . Alcohol Use: No     Comment: pt states has not drank in approx 1 month    Review of Systems  All other systems reviewed and are negative.    Allergies  Review of patient's allergies indicates no known allergies.  Home Medications   Current Outpatient Rx  Name  Route  Sig  Dispense  Refill  . amLODipine (NORVASC) 10 MG tablet   Oral   Take 1 tablet (10 mg total) by mouth daily.   30 tablet   3     HOSPICE PATIENT   . diphenoxylate-atropine (LOMOTIL) 2.5-0.025 MG per tablet   Oral   Take 1 tablet by mouth 4 (four) times daily as needed for diarrhea or loose stools.   30 tablet   0   . HYDROmorphone (DILAUDID) 2 MG tablet   Oral    Take 2 mg by mouth every 4 (four) hours as needed for moderate pain or severe pain.         [name] LORazepam (ATIVAN) 0.5 MG tablet   Oral   Take 0.5-1 mg by mouth at bedtime as needed (sleep).         . morphine (MS CONTIN) 60 MG 12 hr tablet   Oral   Take 1 tablet (60 mg total) by mouth 2 (two) times daily.   60 tablet   0   . oxyCODONE-acetaminophen (PERCOCET/ROXICET) 5-325 MG per tablet   Oral   Take 1 tablet by mouth every 4 (four) hours as needed for moderate pain or severe pain.         [name] prochlorperazine (COMPAZINE) 10 MG tablet   Oral   Take 10 mg by mouth every 6 (six) hours as needed for nausea (nausea).       0    BP 130/92  Pulse 113  Temp(Src) 98 F (36.7 C) (Oral)  Resp 16  SpO2 97%  Vital signs normal except tachycardia  Physical Exam  Nursing note and vitals reviewed. Constitutional: He is oriented to person, place, and time.  Non-toxic appearance. He does not appear ill. No distress.  Thin frail male who appears older than his stated age  HENT:  Head: Normocephalic and atraumatic.  Right Ear: External ear normal.  Left Ear: External ear normal.  Nose: Nose normal. No mucosal edema or rhinorrhea.  Mouth/Throat: Mucous membranes are normal. No dental abscesses or uvula swelling.  Tongue dry  Eyes: Conjunctivae and EOM are normal. Pupils are equal, round, and reactive to light.  Neck: Normal range of motion and full passive range of motion without pain. Neck supple.  Cardiovascular: Normal rate, regular rhythm and normal heart sounds.  Exam reveals no gallop and no friction rub.   No murmur heard. Pulmonary/Chest: Effort normal and breath sounds normal. No respiratory distress. He has no wheezes. He has no rhonchi. He has no rales. He exhibits no tenderness and no crepitus.  Abdominal: Soft. Normal appearance and bowel sounds are normal. He exhibits no distension. There is no tenderness. There is no rebound and no guarding.  Patient has frequent  audible peristaltic bowel sounds during  our interview. He has mild diffuse tenderness of his abdomen. He has a large firm mass in his right lower quadrant about half the size of a palm that has been documented on prior exams.  Genitourinary:  Patient's noted to have a hard raised mass on his right sacrum consistent with tumor.  Musculoskeletal: Normal range of motion. He exhibits no edema and no tenderness.  Moves all extremities well.   Neurological: He is alert and oriented to person, place, and time. He has normal strength. No cranial nerve deficit.  Skin: Skin is warm, dry and intact. No rash noted. No erythema. No pallor.  Psychiatric: He has a normal mood and affect. His speech is normal and behavior is normal. His mood appears not anxious.    ED Course  Procedures (including critical care time)  Medications  0.9 %  sodium chloride infusion (0 mLs Intravenous Stopped 01/02/13 0950)    Followed by  0.9 %  sodium chloride infusion (1,000 mLs Intravenous New Bag/Given 01/02/13 0952)  HYDROmorphone (DILAUDID) injection 1 mg (1 mg Intravenous Given 01/02/13 0855)  ondansetron (ZOFRAN) injection 4 mg (4 mg Intravenous Given 01/02/13 0855)  morphine 2 MG/ML injection 2 mg (2 mg Intravenous Given 01/02/13 1049)   10:20 Pt wanting chips to eat. Requesting more pain medication, states "I know the routine, I get two shots". Appears to be in NAD.   12:20 Pt is feeling better, has been drinking without problems. States he needs to see Dr Truett Perna, he is running out of him pain medications.   Labs Review Results for orders placed during the hospital encounter of 01/02/13  CBC WITH DIFFERENTIAL      Result Value Range   WBC 6.3  4.0 - 10.5 K/uL   RBC 3.12 (*) 4.22 - 5.81 MIL/uL   Hemoglobin 8.9 (*) 13.0 - 17.0 g/dL   HCT 08.6 (*) 57.8 - 46.9 %   MCV 86.5  78.0 - 100.0 fL   MCH 28.5  26.0 - 34.0 pg   MCHC 33.0  30.0 - 36.0 g/dL   RDW 62.9 (*) 52.8 - 41.3 %   Platelets 456 (*) 150 - 400  K/uL   Neutrophils Relative % 78 (*) 43 - 77 %   Lymphocytes Relative 8 (*) 12 - 46 %   Monocytes Relative 14 (*) 3 - 12 %   Eosinophils Relative 0  0 - 5 %   Basophils Relative 0  0 - 1 %   Neutro Abs 4.9  1.7 - 7.7 K/uL   Lymphs Abs 0.5 (*) 0.7 - 4.0 K/uL   Monocytes Absolute 0.9  0.1 - 1.0 K/uL   Eosinophils Absolute 0.0  0.0 - 0.7 K/uL   Basophils Absolute 0.0  0.0 - 0.1 K/uL   WBC Morphology TOXIC GRANULATION    COMPREHENSIVE METABOLIC PANEL      Result Value Range   Sodium 130 (*) 135 - 145 mEq/L   Potassium 3.1 (*) 3.5 - 5.1 mEq/L   Chloride 91 (*) 96 - 112 mEq/L   CO2 31  19 - 32 mEq/L   Glucose, Bld 108 (*) 70 - 99 mg/dL   BUN 23  6 - 23 mg/dL   Creatinine, Ser 2.44  0.50 - 1.35 mg/dL   Calcium 8.4  8.4 - 01.0 mg/dL   Total Protein 7.8  6.0 - 8.3 g/dL   Albumin 2.7 (*) 3.5 - 5.2 g/dL   AST 12  0 - 37 U/L   ALT 8  0 - 53 U/L   Alkaline Phosphatase 66  39 - 117 U/L   Total Bilirubin 0.7  0.3 - 1.2 mg/dL   GFR calc non Af Amer >90  >90 mL/min   GFR calc Af Amer >90  >90 mL/min   Laboratory interpretation all normal except mild worsening of his chronic anemia, chronic hyponatremia and hypokalemia and low chloride. Has some malnutrition   Imaging Review Dg Abd Acute W/chest  01/02/2013   CLINICAL DATA:  Chronic abdominal pain and constipation, rectal cancer with pulmonary metastases  EXAM: ACUTE ABDOMEN SERIES (ABDOMEN 2 VIEW & CHEST 1 VIEW)  COMPARISON:  12/11/2012  FINDINGS: Right subclavian power port with tip projecting over cavoatrial junction.  Normal heart size, mediastinal contours, and pulmonary vascularity.  Multiple pulmonary nodules consistent with pulmonary metastases similar to previous exam.  No definite acute infiltrate, pleural effusion or pneumothorax.  Gas and stool in proximal to mid colon.  Bowel anastomosis left paraspinal at L4 noted.  Additional surgical clips in pelvis.  Paucity of pelvic bowel gas.  Dilated small bowel loop in the right mid abdomen  up to 5.8 cm diameter increased from the 5.1 cm maximum on the previous study.  No definite bowel wall thickening or free intraperitoneal air.  Degenerative disc disease changes lumbar spine primarily at L3-L4.  No acute osseous findings.  IMPRESSION: Extensive pulmonary metastases.  Increased stool and mild gas within the proximal and mid colon with a dilated small bowel loop in the right mid abdomen.  Small bowel distention has slightly increased since the previous exam, question related to ileus or colonic obstruction.   Electronically Signed   By: Ulyses Southward M.D.   On: 01/02/2013 08:32     Dg Abd Acute W/chest  12/11/2012  IMPRESSION: Multiple pulmonary metastases.  Dilated loops of small bowel and colon, grossly unchanged, possibly reflecting adynamic ileus or distal colonic obstruction.  No free air.   Electronically Signed   By: Charline Bills M.D.   On: 12/11/2012 08:02     EKG Interpretation   None       MDM   1. Abdominal pain   2. Dehydration   3. Hyponatremia   4. Hypokalemia   5. Serum chloride decreased     Plan discharge   Devoria Albe, MD, Franz Dell, MD 01/02/13 1228

## 2013-01-05 ENCOUNTER — Other Ambulatory Visit: Payer: Self-pay | Admitting: *Deleted

## 2013-01-05 NOTE — Telephone Encounter (Signed)
Patient requested refill on Amlodipine from [facility]. Patient was using Rx from 2013, dose has been increased since then to 10mg  and Rx is at [facility]. Patient notified, states he will pick up from [facility] next week.

## 2013-01-08 ENCOUNTER — Emergency Department (HOSPITAL_COMMUNITY)
Admission: EM | Admit: 2013-01-08 | Discharge: 2013-01-09 | Disposition: A | Discharge status: Home / Self Care | Attending: Emergency Medicine | Admitting: Emergency Medicine

## 2013-01-08 ENCOUNTER — Emergency Department (HOSPITAL_COMMUNITY)

## 2013-01-08 DIAGNOSIS — F17200 Nicotine dependence, unspecified, uncomplicated: Secondary | ICD-10-CM | POA: Insufficient documentation

## 2013-01-08 DIAGNOSIS — Z85048 Personal history of other malignant neoplasm of rectum, rectosigmoid junction, and anus: Secondary | ICD-10-CM | POA: Insufficient documentation

## 2013-01-08 DIAGNOSIS — R197 Diarrhea, unspecified: Secondary | ICD-10-CM | POA: Insufficient documentation

## 2013-01-08 DIAGNOSIS — I10 Essential (primary) hypertension: Secondary | ICD-10-CM | POA: Insufficient documentation

## 2013-01-08 DIAGNOSIS — Z79899 Other long term (current) drug therapy: Secondary | ICD-10-CM | POA: Insufficient documentation

## 2013-01-08 DIAGNOSIS — R112 Nausea with vomiting, unspecified: Secondary | ICD-10-CM | POA: Insufficient documentation

## 2013-01-08 DIAGNOSIS — R1110 Vomiting, unspecified: Secondary | ICD-10-CM

## 2013-01-08 DIAGNOSIS — E876 Hypokalemia: Secondary | ICD-10-CM | POA: Insufficient documentation

## 2013-01-08 DIAGNOSIS — Z85118 Personal history of other malignant neoplasm of bronchus and lung: Secondary | ICD-10-CM | POA: Insufficient documentation

## 2013-01-08 DIAGNOSIS — Z923 Personal history of irradiation: Secondary | ICD-10-CM | POA: Insufficient documentation

## 2013-01-08 LAB — COMPREHENSIVE METABOLIC PANEL
ALT: 9 U/L (ref 0–53)
AST: 49 U/L — ABNORMAL HIGH (ref 0–37)
Albumin: 2.5 g/dL — ABNORMAL LOW (ref 3.5–5.2)
Alkaline Phosphatase: 62 U/L (ref 39–117)
CO2: 28 mEq/L (ref 19–32)
Calcium: 8.2 mg/dL — ABNORMAL LOW (ref 8.4–10.5)
Creatinine, Ser: 0.77 mg/dL (ref 0.50–1.35)
Glucose, Bld: 90 mg/dL (ref 70–99)
Sodium: 134 mEq/L — ABNORMAL LOW (ref 135–145)
Total Protein: 7.3 g/dL (ref 6.0–8.3)

## 2013-01-08 LAB — CBC WITH DIFFERENTIAL/PLATELET
Basophils Absolute: 0 10*3/uL (ref 0.0–0.1)
Basophils Relative: 0 % (ref 0–1)
Eosinophils Absolute: 0 10*3/uL (ref 0.0–0.7)
Eosinophils Relative: 0 % (ref 0–5)
Lymphocytes Relative: 8 % — ABNORMAL LOW (ref 12–46)
Lymphs Abs: 0.8 10*3/uL (ref 0.7–4.0)
MCHC: 33.2 g/dL (ref 30.0–36.0)
MCV: 86.8 fL (ref 78.0–100.0)
Monocytes Absolute: 0.6 10*3/uL (ref 0.1–1.0)
Monocytes Relative: 6 % (ref 3–12)
Neutro Abs: 8.3 10*3/uL — ABNORMAL HIGH (ref 1.7–7.7)
Platelets: 409 10*3/uL — ABNORMAL HIGH (ref 150–400)
RDW: 15.1 % (ref 11.5–15.5)
WBC: 9.7 10*3/uL (ref 4.0–10.5)

## 2013-01-08 MED ORDER — ONDANSETRON HCL 4 MG/2ML IJ SOLN
4.0000 mg | Freq: Once | INTRAMUSCULAR | Status: AC
  Administered 2013-01-08: 4 mg via INTRAVENOUS
  Filled 2013-01-08: qty 2

## 2013-01-08 MED ORDER — PROMETHAZINE HCL 25 MG PO TABS: 25.0000 mg | ORAL_TABLET | Freq: Four times a day (QID) | ORAL | Status: AC | PRN

## 2013-01-08 MED ORDER — POTASSIUM CHLORIDE 20 MEQ/15ML (10%) PO LIQD
40.0000 meq | Freq: Once | ORAL | Status: AC
  Administered 2013-01-08: 40 meq via ORAL
  Filled 2013-01-08: qty 30

## 2013-01-08 MED ORDER — ONDANSETRON HCL 4 MG PO TABS: 4.0000 mg | ORAL_TABLET | Freq: Four times a day (QID) | ORAL | Status: DC

## 2013-01-08 MED ORDER — POTASSIUM CHLORIDE 10 MEQ/100ML IV SOLN
10.0000 meq | INTRAVENOUS | Status: AC
  Administered 2013-01-08 (×2): 10 meq via INTRAVENOUS
  Filled 2013-01-08 (×2): qty 100

## 2013-01-08 MED ORDER — POTASSIUM CHLORIDE CRYS ER 20 MEQ PO TBCR: 20.0000 meq | EXTENDED_RELEASE_TABLET | Freq: Two times a day (BID) | ORAL | Status: AC

## 2013-01-08 MED ORDER — MORPHINE SULFATE 4 MG/ML IJ SOLN
4.0000 mg | Freq: Once | INTRAMUSCULAR | Status: AC
  Administered 2013-01-08: 4 mg via INTRAVENOUS
  Filled 2013-01-08: qty 1

## 2013-01-08 MED ORDER — SODIUM CHLORIDE 0.9 % IV BOLUS (SEPSIS)
1000.0000 mL | Freq: Once | INTRAVENOUS | Status: AC
  Administered 2013-01-08: 1000 mL via INTRAVENOUS

## 2013-01-08 MED ORDER — POTASSIUM CHLORIDE ER 10 MEQ PO TBCR: 20.0000 meq | EXTENDED_RELEASE_TABLET | Freq: Every day | ORAL | Status: DC

## 2013-01-08 NOTE — ED Notes (Signed)
Pt has hx colon ca. N/V x 7 days.

## 2013-01-08 NOTE — ED Notes (Signed)
Bed: WA07 Expected date:  Expected time:  Means of arrival:  Comments: EMS vomiting x 7 days

## 2013-01-08 NOTE — ED Notes (Signed)
Pt unable to provide stool sample at this time. Ostomy bag leaked before arriving and staff were unable obtain sample from what was left. Pt instructed to let staff know if he had any collection in bag.

## 2013-01-08 NOTE — ED Provider Notes (Addendum)
TIME SEEN: 7:28 PM  CHIEF COMPLAINT: Nausea, vomiting, diarrhea  HPI: Patient is a 52 year old male with a history of endstage rectal adenocarcinoma with metastases to his lungs, soft tissue who presents the emergency department with complaints of nausea, vomiting and diarrhea for the past week. Denies any fevers but states he has had chills. He is undergoing chemotherapy or radiation at this time. He is currently on hospice and is DNR. He states he feels dehydrated. No blood in the output from his ostomy or melena. Patient reports he has not been recently hospitalized and has not been on antibiotics. No sick contacts.  ROS: See HPI Constitutional: no fever  Eyes: no drainage  ENT: no runny nose   Cardiovascular:  no chest pain  Resp: no SOB  GI: vomiting GU: no dysuria Integumentary: no rash  Allergy: no hives  Musculoskeletal: no leg swelling  Neurological: no slurred speech ROS otherwise negative  PAST MEDICAL HISTORY/PAST SURGICAL HISTORY:  Past Medical History  Diagnosis Date  . Hypertension     no meds  . Cancer     rectal   . 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 S/P radiation therapy 9-23 to 11-13-12                                                          R buttock nodule / 20 Gy in 5 fractions                           MEDICATIONS:  Prior to Admission medications   Medication Sig Start Date End Date Taking? Authorizing Provider  amLODipine (NORVASC) 10 MG tablet Take 1 tablet (10 mg total) by mouth daily. 11/16/12   Ladene Artist, MD  diphenoxylate-atropine (LOMOTIL) 2.5-0.025 MG per tablet Take 1 tablet by mouth 4 (four) times daily as needed for diarrhea or loose stools. 12/13/12   Rana Snare, NP  HYDROmorphone (DILAUDID) 2 MG tablet Take 2 mg by mouth every 4 (four) hours as needed for moderate pain or severe pain.    Historical Provider,  MD  LORazepam (ATIVAN) 0.5 MG tablet Take 0.5-1 mg by mouth at bedtime as needed (sleep). 12/22/12   Rana Snare, NP  morphine (MS CONTIN) 60 MG 12 hr tablet Take 1 tablet (60 mg total) by mouth 2 (two) times daily. 01/02/13   Ladene Artist, MD  oxyCODONE-acetaminophen (PERCOCET/ROXICET) 5-325 MG per tablet Take 1 tablet by mouth every 4 (four) hours as needed for moderate pain or severe pain. 01/02/13   Ladene Artist, MD  prochlorperazine (COMPAZINE) 10 MG tablet Take 10 mg by mouth every 6 (six) hours as needed for nausea (nausea).  11/30/12   Ladene Artist, MD    ALLERGIES:  No Known Allergies  SOCIAL HISTORY:  History  Substance Use Topics  . Smoking status: Current Every Day Smoker -- 0.25 packs/day for 12 years    Types: Cigarettes  . Smokeless tobacco: Never Used  . Alcohol Use: No     Comment: pt states has not drank in approx 1 month    FAMILY HISTORY: Family History  Problem Relation Age of  Onset  . Cancer Mother     lung  . Cancer Paternal Uncle     EXAM: There were no vitals taken for this visit. CONSTITUTIONAL: Alert and oriented and responds appropriately to questions. Cachectic, appears older than stated age, chronically ill-appearing HEAD: Normocephalic EYES: Conjunctivae clear, PERRL ENT: normal nose; no rhinorrhea; dry mucous membranes; pharynx without lesions noted NECK: Supple, no meningismus, no LAD  CARD: Tachycardic; S1 and S2 appreciated; no murmurs, no clicks, no rubs, no gallops RESP: Normal chest excursion without splinting or tachypnea; breath sounds clear and equal bilaterally; no wheezes, no rhonchi, no rales,  ABD/GI: Hyperactive bowel sounds, nondistended abdomen, abdomen is diffusely tender to palpation but soft without peritoneal signs, possibly the left lower quadrant with light brown liquid stool, patient has a mass in his right lower quadrant that has been well documented in the past BACK:  The back appears normal and is non-tender to  palpation, there is no CVA tenderness EXT: Normal ROM in all joints; non-tender to palpation; no edema; normal capillary refill; no cyanosis    SKIN: Normal color for age and race; warm NEURO: Moves all extremities equally PSYCH: The patient's mood and manner are appropriate. Grooming and personal hygiene are appropriate.  MEDICAL DECISION MAKING: Patient here with vomiting, diarrhea for one week. He is tachycardic and appears dehydrated on exam. Will obtain abdominal labs, urine, stool culture and C. difficile. We will give IV fluids, antiemetics and pain medication and reassess.  ED PROGRESS: Labs shows potassium of 2.7.  Will replace.  Pt asking to eat.  Will po challenge.   Patient has been able to tolerate by mouth reports feeling much better. He feels that he can be discharged home. Have offered admission but pt would like to go home.  His tachycardia has improved. We'll discharge home with potassium tablets, phenergan. Given return precautions. Patient verbalizes understanding and is comfortable with plan.  EKG is unchanged from September 2013.    Date: 01/08/2013 19:46  Rate: 114  Rhythm: sinus tachycardia  QRS Axis: normal  Intervals: Prolonged QTC  ST/T Wave abnormalities: Diffuse nonspecific ST flattening  Conduction Disutrbances: none  Narrative Interpretation: Sinus tachycardia, left atrial enlargement, LVH, nonspecific ST changes, prolonged QTC, unchanged compared to prior EKG September of 2013     Layla Maw Ward, DO 01/08/13 2307  Layla Maw Ward, DO 01/08/13 2322

## 2013-01-09 ENCOUNTER — Ambulatory Visit: Payer: Medicare Other | Admitting: Oncology

## 2013-01-09 MED ORDER — HEPARIN SOD (PORK) LOCK FLUSH 100 UNIT/ML IV SOLN
500.0000 [IU] | Freq: Once | INTRAVENOUS | Status: AC
  Administered 2013-01-09: 500 [IU]
  Filled 2013-01-09: qty 5

## 2013-01-09 NOTE — ED Notes (Signed)
[facility] paged for transport home.

## 2013-01-15 ENCOUNTER — Other Ambulatory Visit: Payer: Self-pay | Admitting: *Deleted

## 2013-01-17 ENCOUNTER — Telehealth: Payer: Self-pay | Admitting: Nurse Practitioner

## 2013-01-17 NOTE — Telephone Encounter (Signed)
appt made per 12/1 POF sw pt and advised of same shh

## 2013-01-19 ENCOUNTER — Ambulatory Visit (HOSPITAL_BASED_OUTPATIENT_CLINIC_OR_DEPARTMENT_OTHER): Payer: Medicare Other | Admitting: Nurse Practitioner

## 2013-01-19 ENCOUNTER — Telehealth: Payer: Self-pay | Admitting: Oncology

## 2013-01-19 VITALS — BP 139/103 | HR 133 | Temp 97.5°F | Resp 20 | Ht 64.0 in | Wt 86.9 lb

## 2013-01-19 DIAGNOSIS — C7800 Secondary malignant neoplasm of unspecified lung: Secondary | ICD-10-CM

## 2013-01-19 DIAGNOSIS — G620 Drug-induced polyneuropathy: Secondary | ICD-10-CM

## 2013-01-19 DIAGNOSIS — C20 Malignant neoplasm of rectum: Secondary | ICD-10-CM

## 2013-01-19 MED ORDER — LORAZEPAM 0.5 MG PO TABS: 0.5000 mg | ORAL_TABLET | Freq: Every evening | ORAL | Status: DC | PRN

## 2013-01-19 MED ORDER — LORAZEPAM 0.5 MG PO TABS: 0.5000 mg | ORAL_TABLET | Freq: Every evening | ORAL | Status: AC | PRN

## 2013-01-19 MED ORDER — MORPHINE SULFATE ER 60 MG PO TBCR: 60.0000 mg | EXTENDED_RELEASE_TABLET | Freq: Two times a day (BID) | ORAL | Status: DC

## 2013-01-19 MED ORDER — OXYCODONE-ACETAMINOPHEN 5-325 MG PO TABS: 1.0000 | ORAL_TABLET | ORAL | Status: AC | PRN

## 2013-01-19 MED ORDER — SORBITOL 70 % PO SOLN: 15.0000 mL | Freq: Every day | ORAL | Status: DC | PRN

## 2013-01-19 MED ORDER — MORPHINE SULFATE ER 60 MG PO TBCR: 60.0000 mg | EXTENDED_RELEASE_TABLET | Freq: Two times a day (BID) | ORAL | Status: AC

## 2013-01-19 MED ORDER — SORBITOL 70 % PO SOLN: 15.0000 mL | Freq: Every day | ORAL | Status: AC | PRN

## 2013-01-19 MED ORDER — OXYCODONE-ACETAMINOPHEN 5-325 MG PO TABS: 1.0000 | ORAL_TABLET | ORAL | Status: DC | PRN

## 2013-01-19 NOTE — Progress Notes (Addendum)
OFFICE PROGRESS NOTE  Interval history:  Jeffery [name] returns for followup of metastatic rectal cancer. He is enrolled in the [facility] hospice program. He reports overall good pain control on MS Contin. He takes Percocet as needed for right abdominal and back pain. He is in no pain at present. He is having intermittent constipation. He takes sorbitol with good results. He has intermittent nausea/vomiting. Overall good appetite. He likes to eat fruits. He continues to lose weight. He denies shortness of breath.   Objective: Blood pressure 139/103, pulse 133, temperature 97.5 F (36.4 C), temperature source Oral, resp. rate 20, height 5\' 4"  (1.626 m), weight 86 lb 14.4 oz (39.418 kg).  Cachectic appearance. No thrush or ulcerations. Lungs clear. Heart is regular, tachycardic. Port-A-Cath site without erythema. Abdomen distended. Palpable mass right lower abdomen. No leg edema.  Lab Results: Lab Results  Component Value Date   WBC 9.7 01/08/2013   HGB 8.3* 01/08/2013   HCT 25.0* 01/08/2013   MCV 86.8 01/08/2013   PLT 409* 01/08/2013    Chemistry:    Chemistry      Component Value Date/Time   NA 134* 01/08/2013 2100   NA 140 08/22/2012 1152   K 2.7* 01/08/2013 2100   K 3.8 08/22/2012 1152   CL 96 01/08/2013 2100   CL 104 07/26/2012 0744   CO2 28 01/08/2013 2100   CO2 29 08/22/2012 1152   BUN 21 01/08/2013 2100   BUN 15.3 08/22/2012 1152   CREATININE 0.77 01/08/2013 2100   CREATININE 1.1 08/22/2012 1152      Component Value Date/Time   CALCIUM 8.2* 01/08/2013 2100   CALCIUM 9.4 08/22/2012 1152   ALKPHOS 62 01/08/2013 2100   ALKPHOS 62 08/22/2012 1152   AST 49* 01/08/2013 2100   AST 16 08/22/2012 1152   ALT 9 01/08/2013 2100   ALT 12 08/22/2012 1152   BILITOT 0.5 01/08/2013 2100   BILITOT 0.60 08/22/2012 1152       Studies/Results: Dg Abd Acute W/chest  01/08/2013   CLINICAL DATA:  Vomiting and diarrhea, history of rectal carcinoma  EXAM: ACUTE ABDOMEN SERIES (ABDOMEN 2 VIEW & CHEST 1  VIEW)  COMPARISON:  01/02/2013  FINDINGS: Extensive pulmonary metastatic lesions are again identified and stable from the prior study. A right chest wall port is again seen. The abdomen shows a few loops of prominently dilated small bowel in the right mid abdomen. Distal bowel gas is noted. This may represent a ileus in correlation with the physical exam is recommended. The degree of small bowel dilatation is increased in the interval from the prior exam. No free air is seen.  IMPRESSION: Increase in the degree of small bowel dilatation. Clinical correlation is recommended.  Stable pulmonary metastatic lesions.   Electronically Signed   By: Alcide Clever M.D.   On: 01/08/2013 21:29   Dg Abd Acute W/chest  01/02/2013   CLINICAL DATA:  Chronic abdominal pain and constipation, rectal cancer with pulmonary metastases  EXAM: ACUTE ABDOMEN SERIES (ABDOMEN 2 VIEW & CHEST 1 VIEW)  COMPARISON:  12/11/2012  FINDINGS: Right subclavian power port with tip projecting over cavoatrial junction.  Normal heart size, mediastinal contours, and pulmonary vascularity.  Multiple pulmonary nodules consistent with pulmonary metastases similar to previous exam.  No definite acute infiltrate, pleural effusion or pneumothorax.  Gas and stool in proximal to mid colon.  Bowel anastomosis left paraspinal at L4 noted.  Additional surgical clips in pelvis.  Paucity of pelvic bowel gas.  Dilated small bowel  loop in the right mid abdomen up to 5.8 cm diameter increased from the 5.1 cm maximum on the previous study.  No definite bowel wall thickening or free intraperitoneal air.  Degenerative disc disease changes lumbar spine primarily at L3-L4.  No acute osseous findings.  IMPRESSION: Extensive pulmonary metastases.  Increased stool and mild gas within the proximal and mid colon with a dilated small bowel loop in the right mid abdomen.  Small bowel distention has slightly increased since the previous exam, question related to ileus or colonic  obstruction.   Electronically Signed   By: Ulyses Southward M.D.   On: 01/02/2013 08:32    Medications: I have reviewed the patient's current medications.  Assessment/Plan:  1. Clinical stage III (uT3 uN1) rectal cancer status post pelvic radiation, August 24 through November 18, 2009. 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 There was no lymph node involvement (pT3 pN0). 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 He began adjuvant CAPOX chemotherapy March 11, 2010. He completed the sixth and final cycle of CAPOX 06/25/2010. CT of the abdomen and pelvis 03/11/2011 confirmed multiple nodules at the lung bases consistent with metastatic disease. 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 He completed cycle 1 of FOLFIRI/Avastin 04/29/2011. He completed cycle 3 05/27/2011. CEA was improved on 06/10/2011. He completed cycle 4 on 06/10/2011. He completed cycle 5 on 06/24/2011. He completed cycle 6 on 07/08/2011. Restaging CT evaluation 07/22/2011 showed a slight decrease in pulmonary nodules. He completed cycle 8 FOLFIRI/Avastin on 08/12/2011. He completed cycle 9 on 09/02/2011. Chest CT on 10/07/2011 showed increased size and number of lung nodules. CT scans 12/13/2011 with mild progression of pulmonary metastatic disease; interval resolution of left upper quadrant and central pelvic fluid collections; residual gas and fluid collection in the undersurface of the left lower quadrant ventral abdominal wall; similar appearance of soft tissue thickening and enhancement to the perirectal region; abnormal small bowel dilatation and moderate colonic stool burden consistent with ileus and/or constipation. He  completed cycle 1 FOLFOXIRI on 01/12/2012; cycle 4 on 02/24/2012.The CEA was slightly lower on 02/24/2012. Restaging CT on 03/09/2012 without evidence of disease progression. He completed cycle 5 on 03/28/2012. The CEA was more elevated on 04/10/2012 (22.9 ). He completed cycle 6 FOLFOXIRI on 04/12/2012 ,cycle 7 on 04/26/2012, and cycle 8 on 05/10/2012. CEA was improved on 05/10/2012. Restaging CT 05/19/2012 revealed stable lung nodules and stable postoperative changes in the abdomen/pelvis, stable CEA 06/14/2012. CEA stable on 06/28/2012. He completed cycle 12 FOLFOXIRI on 07/26/2012. -Restaging CT 08/03/2012 with enlargement of some of the lung nodules, increased perirectal mass, increased size of a splenic lesion. Irinotecan and oxaliplatin were discontinued. Systemic therapy continued with 5-FU/leucovorin.  -CT 08/22/2012 showed interval progression of central right lower lobe tumor involving the right lower lobe bronchus and mild increase in a splenic lesion. Status post palliative radiation to both areas 08/31/2012 through 09/20/2012.  2. Strangulated peristomal hernia and small bowel obstruction status post reduction of peristomal hernia, omentectomy, exploratory laparotomy, small bowel resection x2 on January 28, 2010.  3. History of rectal bleeding secondary to number 1.  4. History of an elevated CEA. Improved on 06/10/2011 and 07/29/2011. 5. History of a left adrenal nodule.  6. Weight loss. Improved. 7. Right knee pain/effusion status post evaluation at  [facility].  8. CT abdomen/pelvis 03/11/2011 with multiple nodules at the lung bases consistent with metastatic involvement of the lungs; prominent presacral soft tissue; air bubble within the presacral soft tissue appeared to be extraluminal and breakdown at the anastomotic site could not be excluded. No discrete abscess was seen. 9. Elevated blood pressure. Question effect of Avastin. He was started on Norvasc on 06/10/2011.   10. Frequent bowel movements. Likely related to multiple bowel resections. Imodium was not effective. He is taking Lomotil. 11. Suture at the ileostomy reversal scar. Suture removed during the recent hospitalization. 12. Erectile dysfunction. The testosterone level returned at 508 on 08/12/2011, he was referred to urology, but Medicaid would not pay for this visit. 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 Culture returned positive for abundant Escherichia coli. He completed a course of Augmentin.  14. Status post diverting loop descending colostomy 10/05/2011. 15. Status post multiple rectal biopsies 10/05/2011, all negative for malignancy. 16. Hospitalization 10/13/2011 with a bowel obstruction. Resolved. 17. Hospitalization 11/07/2011 with a recurrent small bowel obstruction. He was taken to the OR on 11/10/2011 and underwent an exploratory laparotomy with small bowel resection x2 and incidental appendectomy. He was found to have a closed loop obstruction. Pathology on #1 small bowel resection showed focal perforation with no evidence of malignancy; pathology on #2 small bowel resection showed metastatic adenocarcinoma consistent with colorectal primary; pathology on the appendix showed serosal fibrous adhesions with no evidence of malignancy.He developed postoperative respiratory failure, sepsis and renal insufficiency. He improved gradually and was able to be extubated. He required placement of multiple percutaneous drains for intraperitoneal abscesses. He was discharged home on 12/02/2011. No remaining abscess on the CT 03/09/2012. The pelvic drain has been removed. 18. Deconditioning. Improved. 19. Low back and testicular pain when supine, tenderness of the right testicle. Improved. He did not complain of this pain at today's visit. 20. Diarrhea following cycle 1 FOLFOXIRI. 21. Pain at previous pelvic drain site, ileostomy scar and the lower aspect of the  midline scar. He takes Percocet as needed. ? neuropathic pain. 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 23. Acute onset pleuritic chest pain 08/22/2012. The pain is likely related to a pleural-based versus a splenic mass. He completed a course of palliative radiation 08/31/2012 through 09/20/2012. The pain is better.  24. Hospitalization 09/28/2012 through 10/09/2012 with bowel obstruction status post exploratory laparotomy and intestinal bypass of closed loop obstruction to the transverse colon, placement of a gastrostomy tube. 25. Painful subcutaneous mass right buttock, status post palliative radiation completed 11/13/2012.  Disposition-Jeffery [name]'s performance status continues to decline. He is enrolled in the [facility] hospice program. His pain is well controlled at present with MS Contin and Percocet. We will continue to follow on a supportive care approach. He will return for a followup visit in approximately one month.  Patient seen with Dr. Truett Perna.  Lonna Cobb ANP/GNP-BC   This was shared visit with Lonna Cobb.  Jeffery [name] appears to be declining with metastatic rectal cancer. The plan is to continue comfort care. He is followed by the [facility] hospice program.  Mancel Bale, M.D.

## 2013-01-19 NOTE — Telephone Encounter (Signed)
Gave pt appt for MD only january 2015

## 2013-01-29 ENCOUNTER — Telehealth: Payer: Self-pay | Admitting: *Deleted

## 2013-01-29 NOTE — Telephone Encounter (Signed)
Patient died in home 01/28/14 @ 10:02 am.

## 2013-02-01 ENCOUNTER — Other Ambulatory Visit: Payer: Self-pay | Admitting: Oncology

## 2013-02-15 DEATH — deceased

## 2013-02-20 ENCOUNTER — Ambulatory Visit: Payer: Medicare Other | Admitting: Oncology

## 2013-02-28 IMAGING — CR DG ABDOMEN 1V
2 series · 2 of 2 positions shown · non-contrast
Comparison: 11/07/2011

CLINICAL DATA: Evaluate NG tube placement

ABDOMEN - 1 VIEW

[t abdomen supine (1 of 2)]
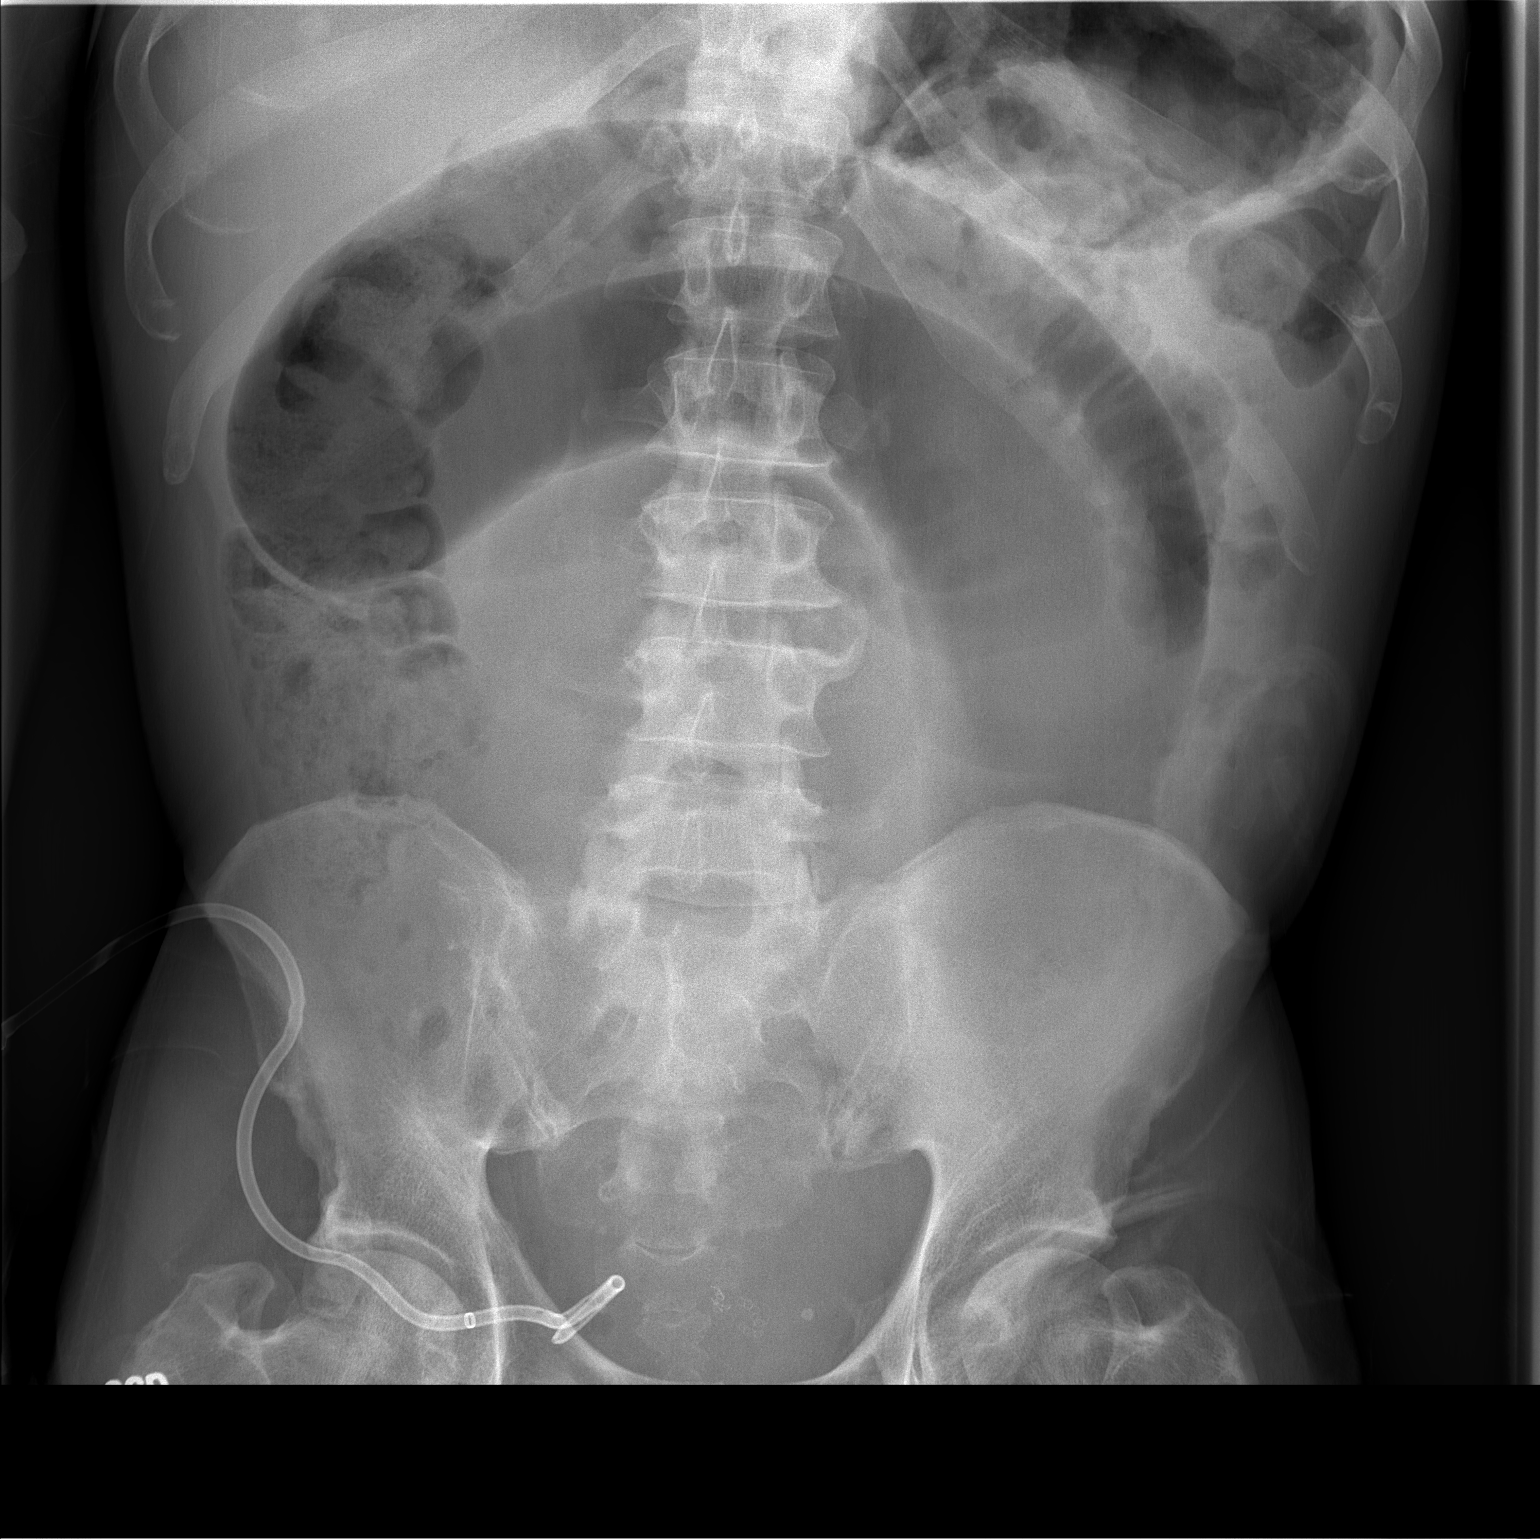

[t abdomen supine (2 of 2)]
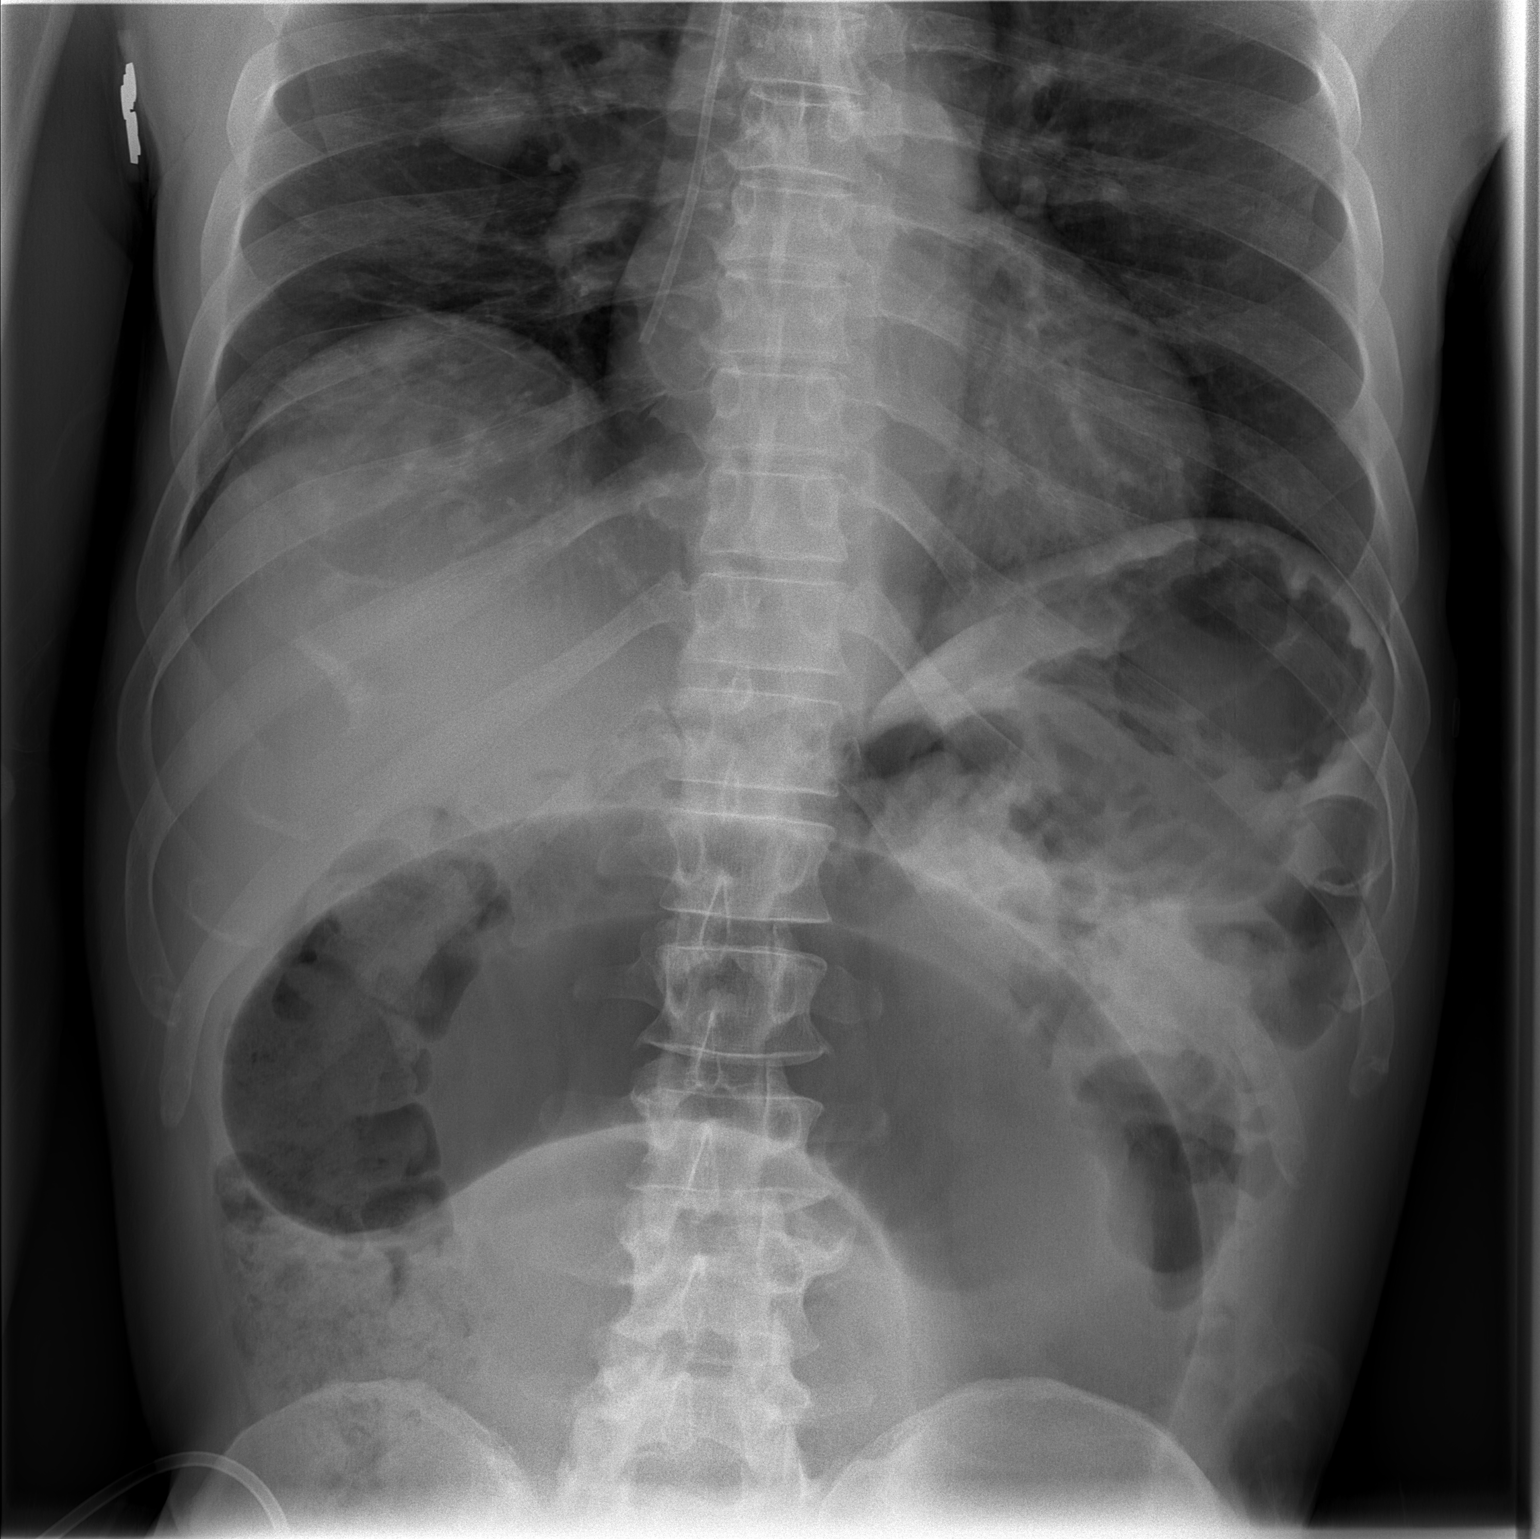

[2 of 2 positions shown; findings below may reference images not displayed]

FINDINGS: No enteric tube is seen.

Dilated loop of small bowel in the mid abdomen, unchanged,
compatible with small bowel obstruction.

Pigtail drainage catheter terminating in the right pelvis.

2.2 cm nodule in the right mid/lower lung, suspicious for
metastasis, grossly unchanged.
IMPRESSION: No enteric tube is seen.

Stable small bowel obstruction.

## 2013-03-01 IMAGING — CR DG ABDOMEN 1V
1 series · 2 of 2 positions shown · non-contrast
Comparison: 11/08/2011.

CLINICAL DATA: Small bowel obstruction.  Small bowel dilation.

ABDOMEN - 1 VIEW

[Series 1: AP · U · 2 of 2 slices shown]
[im 1/2]
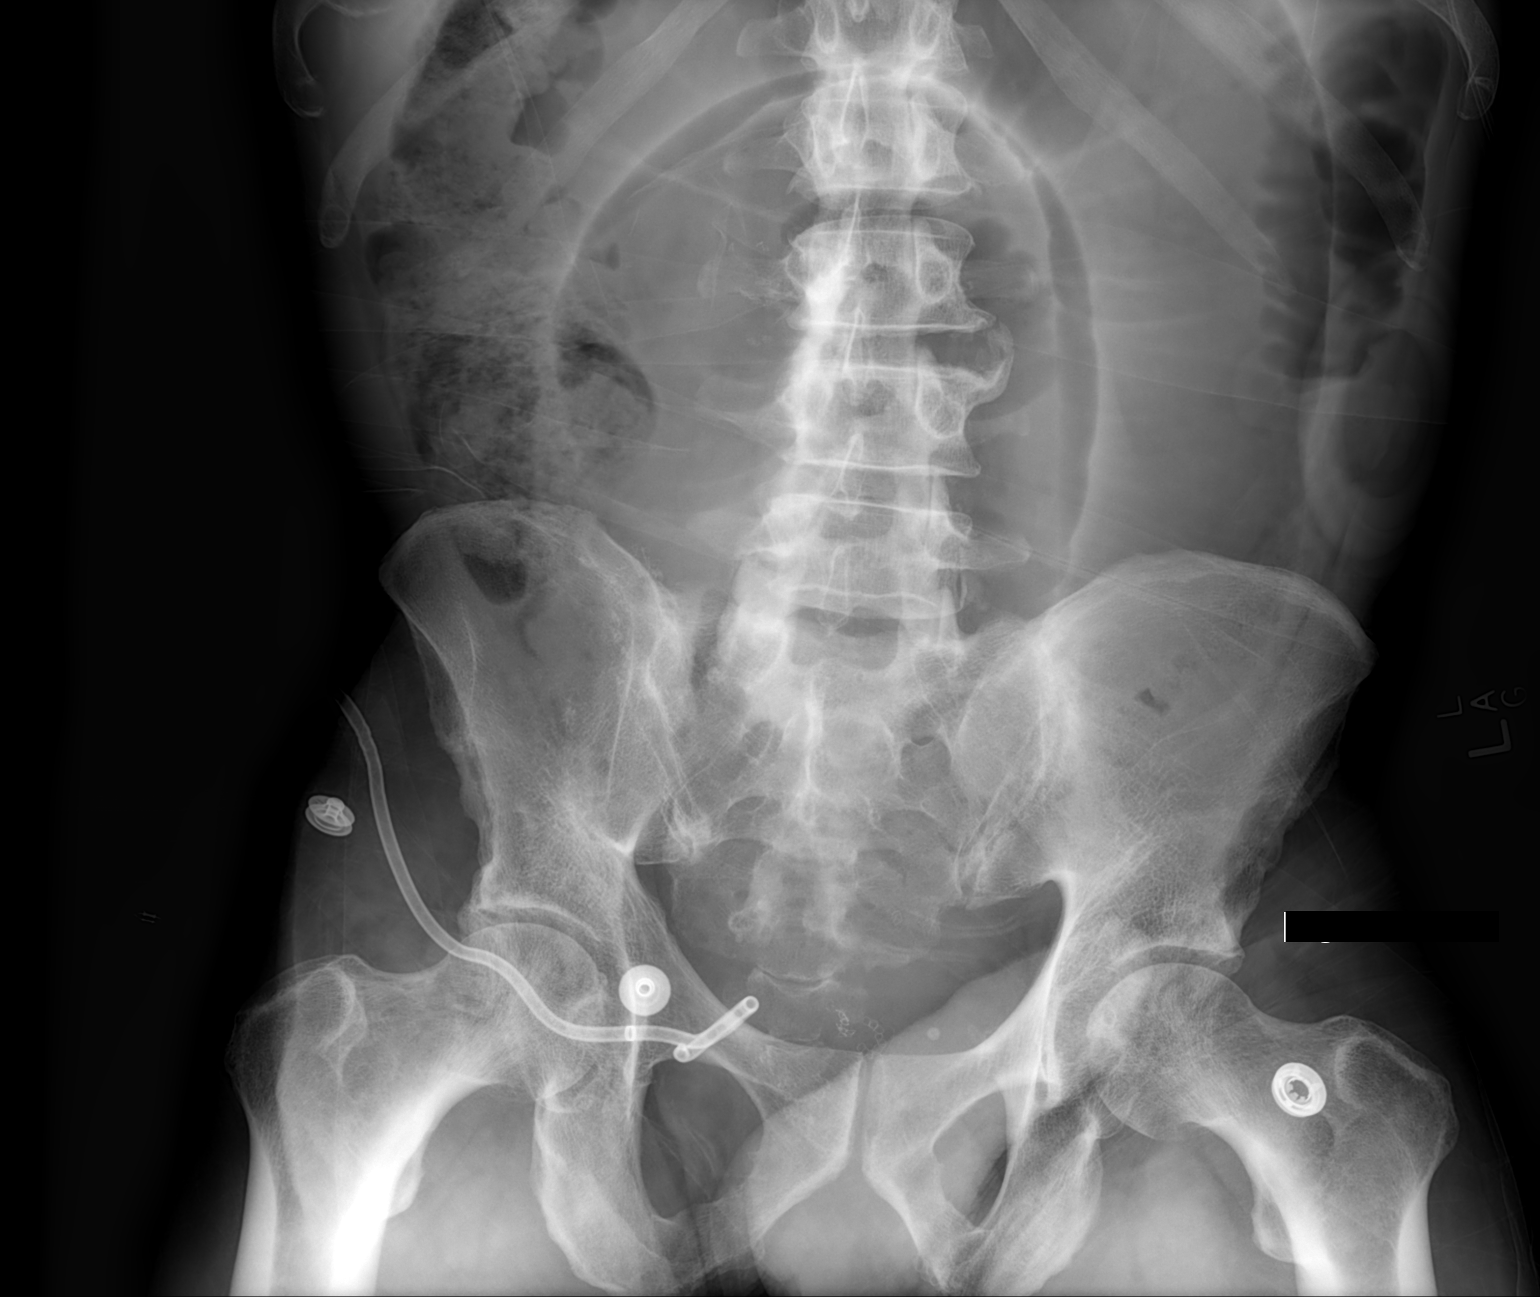
[im 2/2]
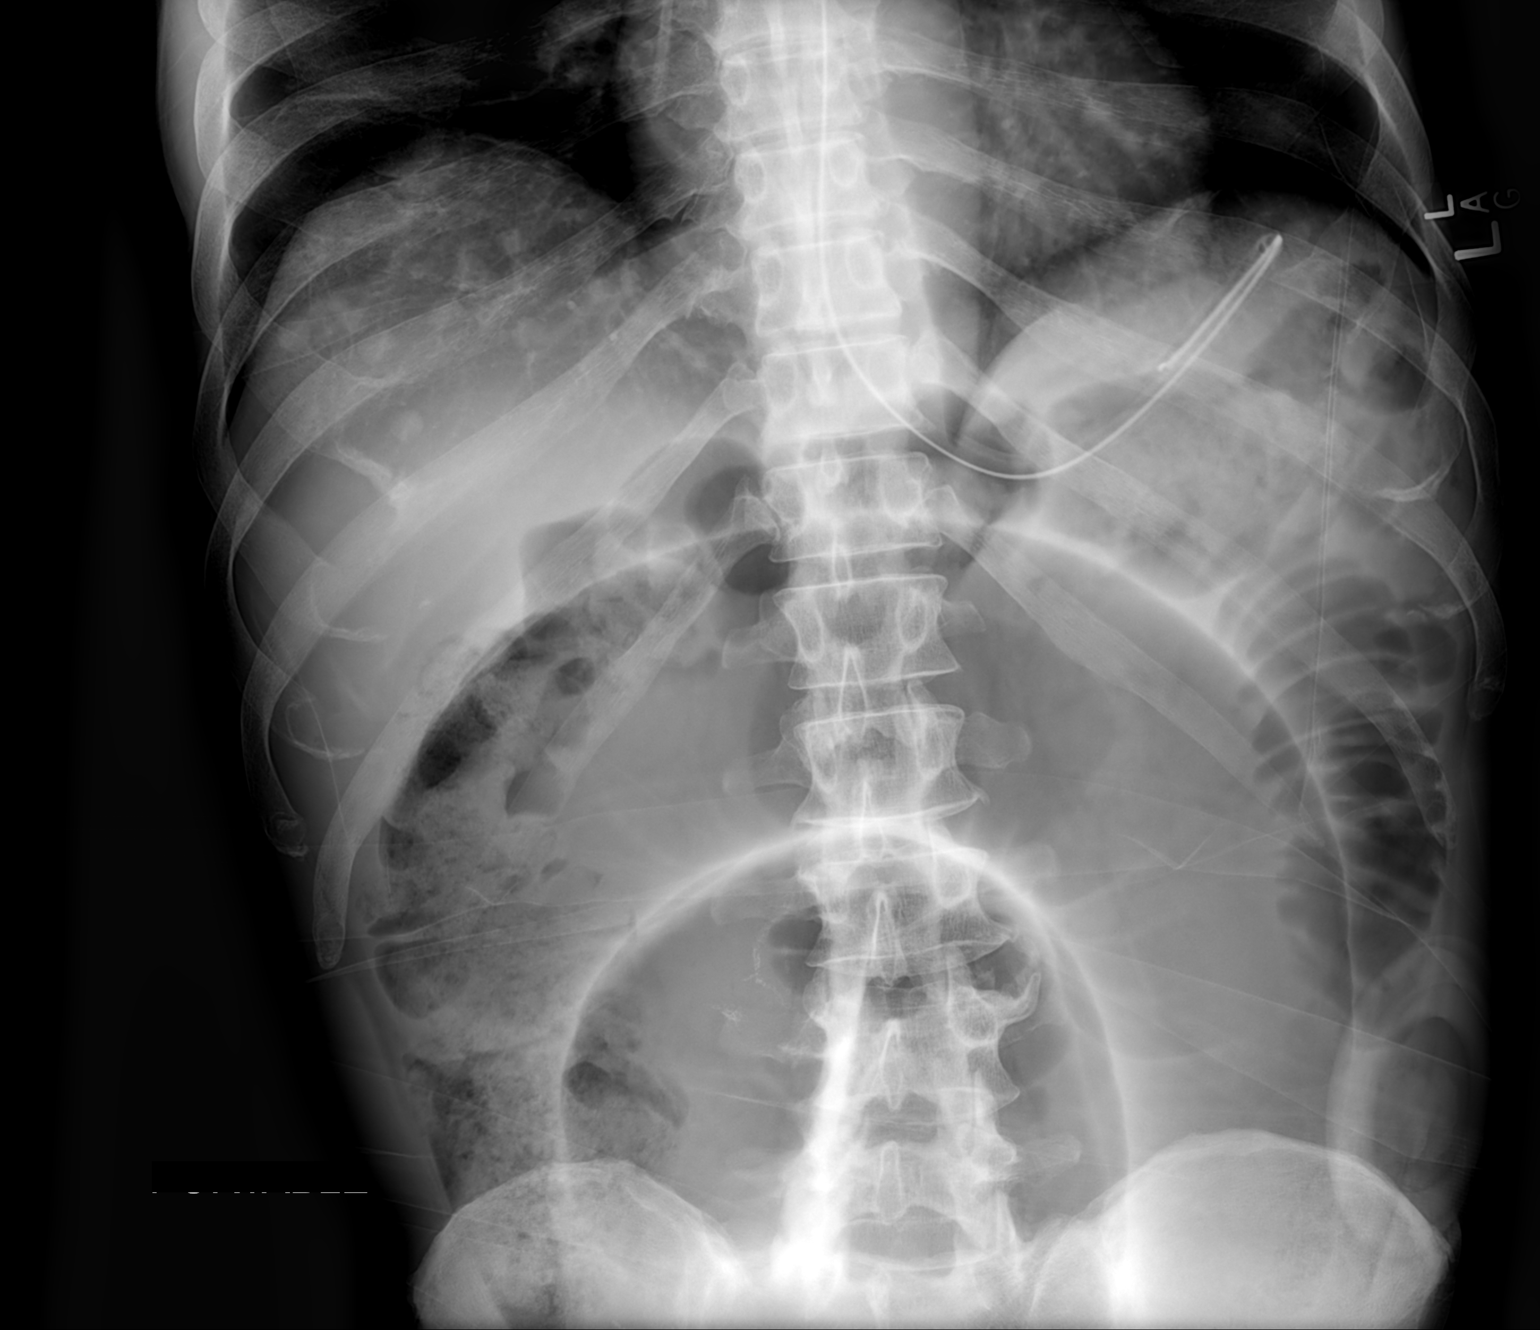

[2 of 2 positions shown; findings below may reference images not displayed]

FINDINGS: Nasogastric tube is present in the proximal stomach.
There is a persistent dilated loop of small bowel in the central
abdomen which measures 8.3 cm, similar yesterday's exam.  There is
now a second dilated loop central to the larger dilated loop.
Pigtail drainage catheter is present in the right lower quadrant.
Staples are present in the region of the rectosigmoid.
IMPRESSION: 1. Persistent  small bowel obstruction.  Maximal diameter is 84 mm.
2.  Nasogastric tube in the proximal stomach.
3.  Pigtail drainage catheter in the pelvis is unchanged.

## 2013-03-02 IMAGING — CR DG CHEST 1V PORT
1 series · 1 of 1 positions shown · non-contrast
Comparison: 3837 hours the same day and earlier.

CLINICAL DATA: 51-year-old male intubated.  Acute respiratory
failure. Metastatic rectal carcinoma..

PORTABLE CHEST - 1 VIEW

[AP]
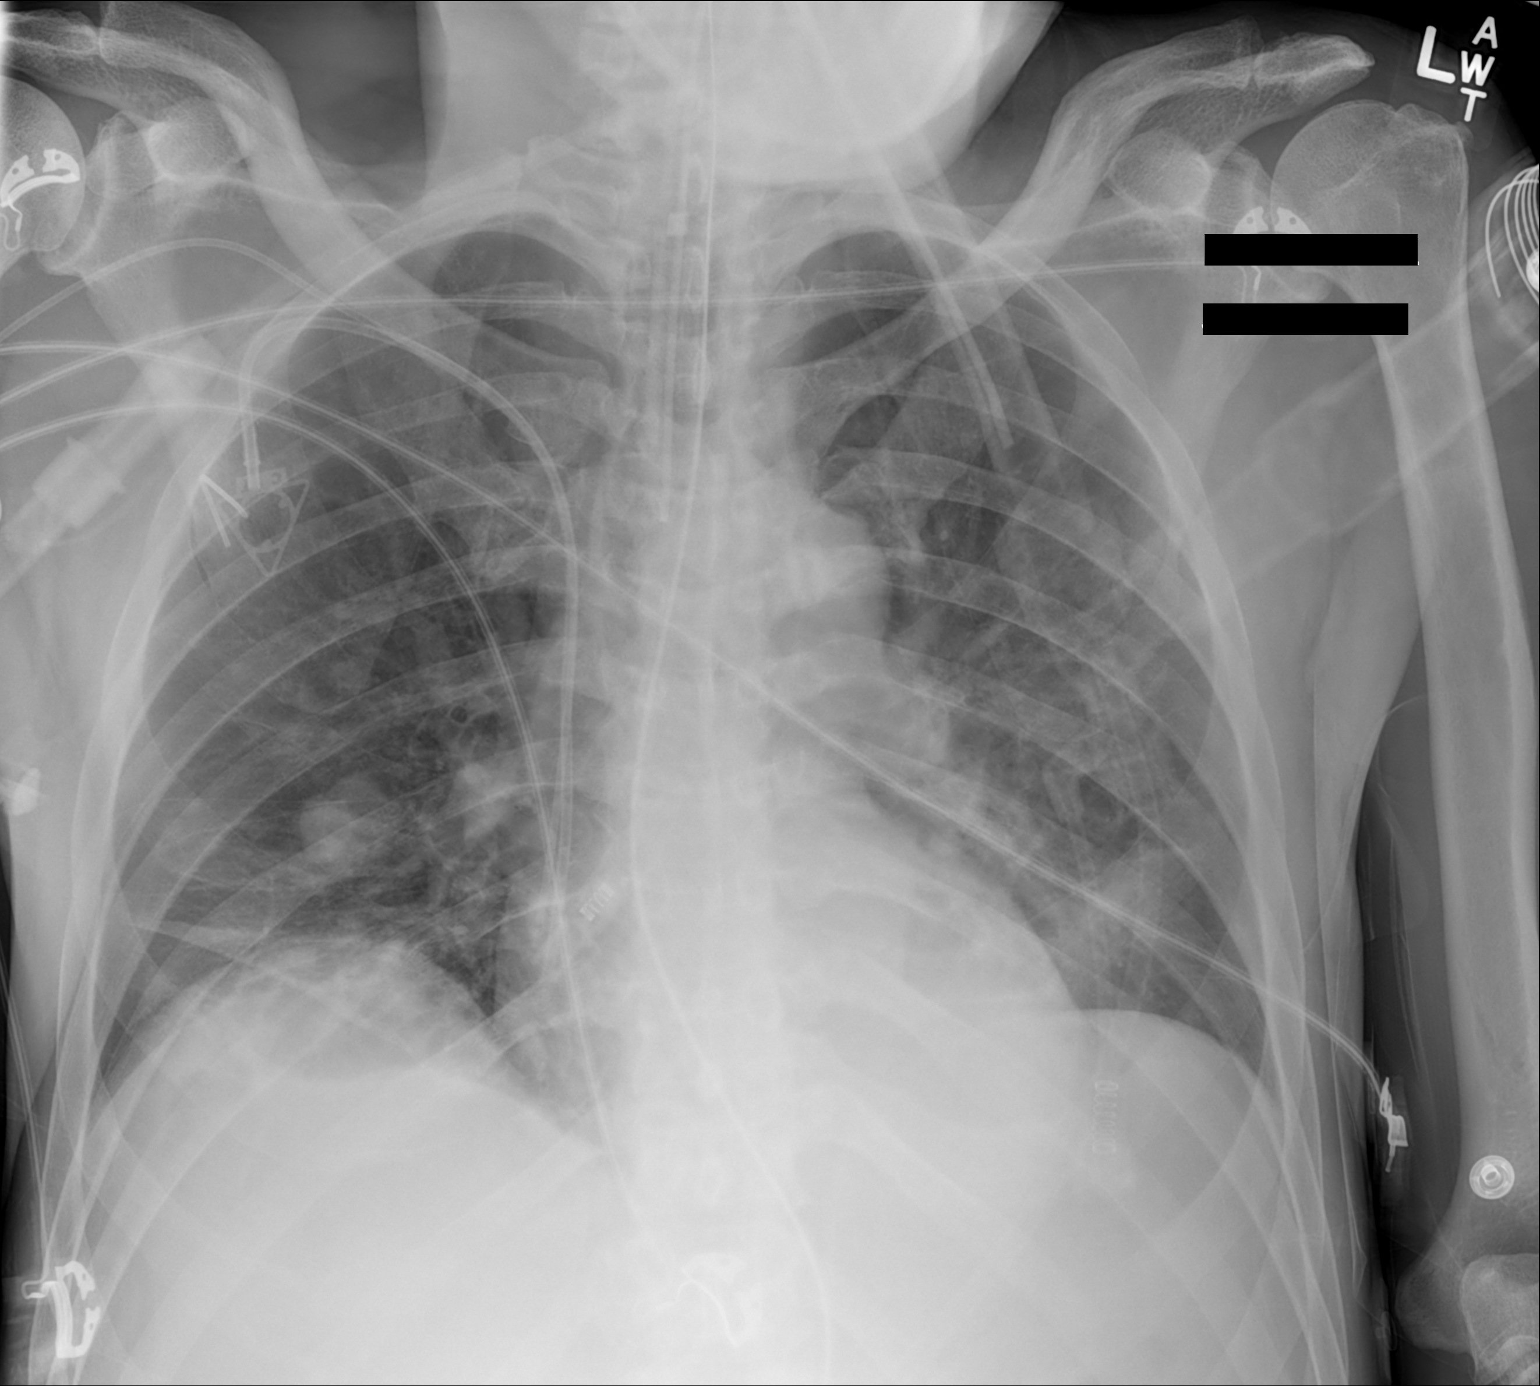

[1 of 1 positions shown; findings below may reference images not displayed]

FINDINGS: Portable semi upright AP view 2727 hours.  Endotracheal
tube tip in good position between the level of clavicles and
carina.  Stable right chest port and right PICC line/subclavian
line.  Enteric tube courses to the abdomen, tip not included.

Stable lung volumes.  Multiple right side lung nodules/masses re-
identified. Overall stable ventilation.  No pneumothorax or
definite effusion.  Cardiac size and mediastinal contours are
within normal limits.
IMPRESSION: 1.  Lines and tubes appear stable and appropriately positioned.
2.  Stable ventilation.
3.  Pulmonary metastatic disease.

## 2013-03-02 IMAGING — CR DG CHEST 1V
1 series · 1 of 1 positions shown · non-contrast
Comparison: CT chest dated 10/07/2011

CLINICAL DATA: Endotracheal tube placement

CHEST - 1 VIEW

[AP]
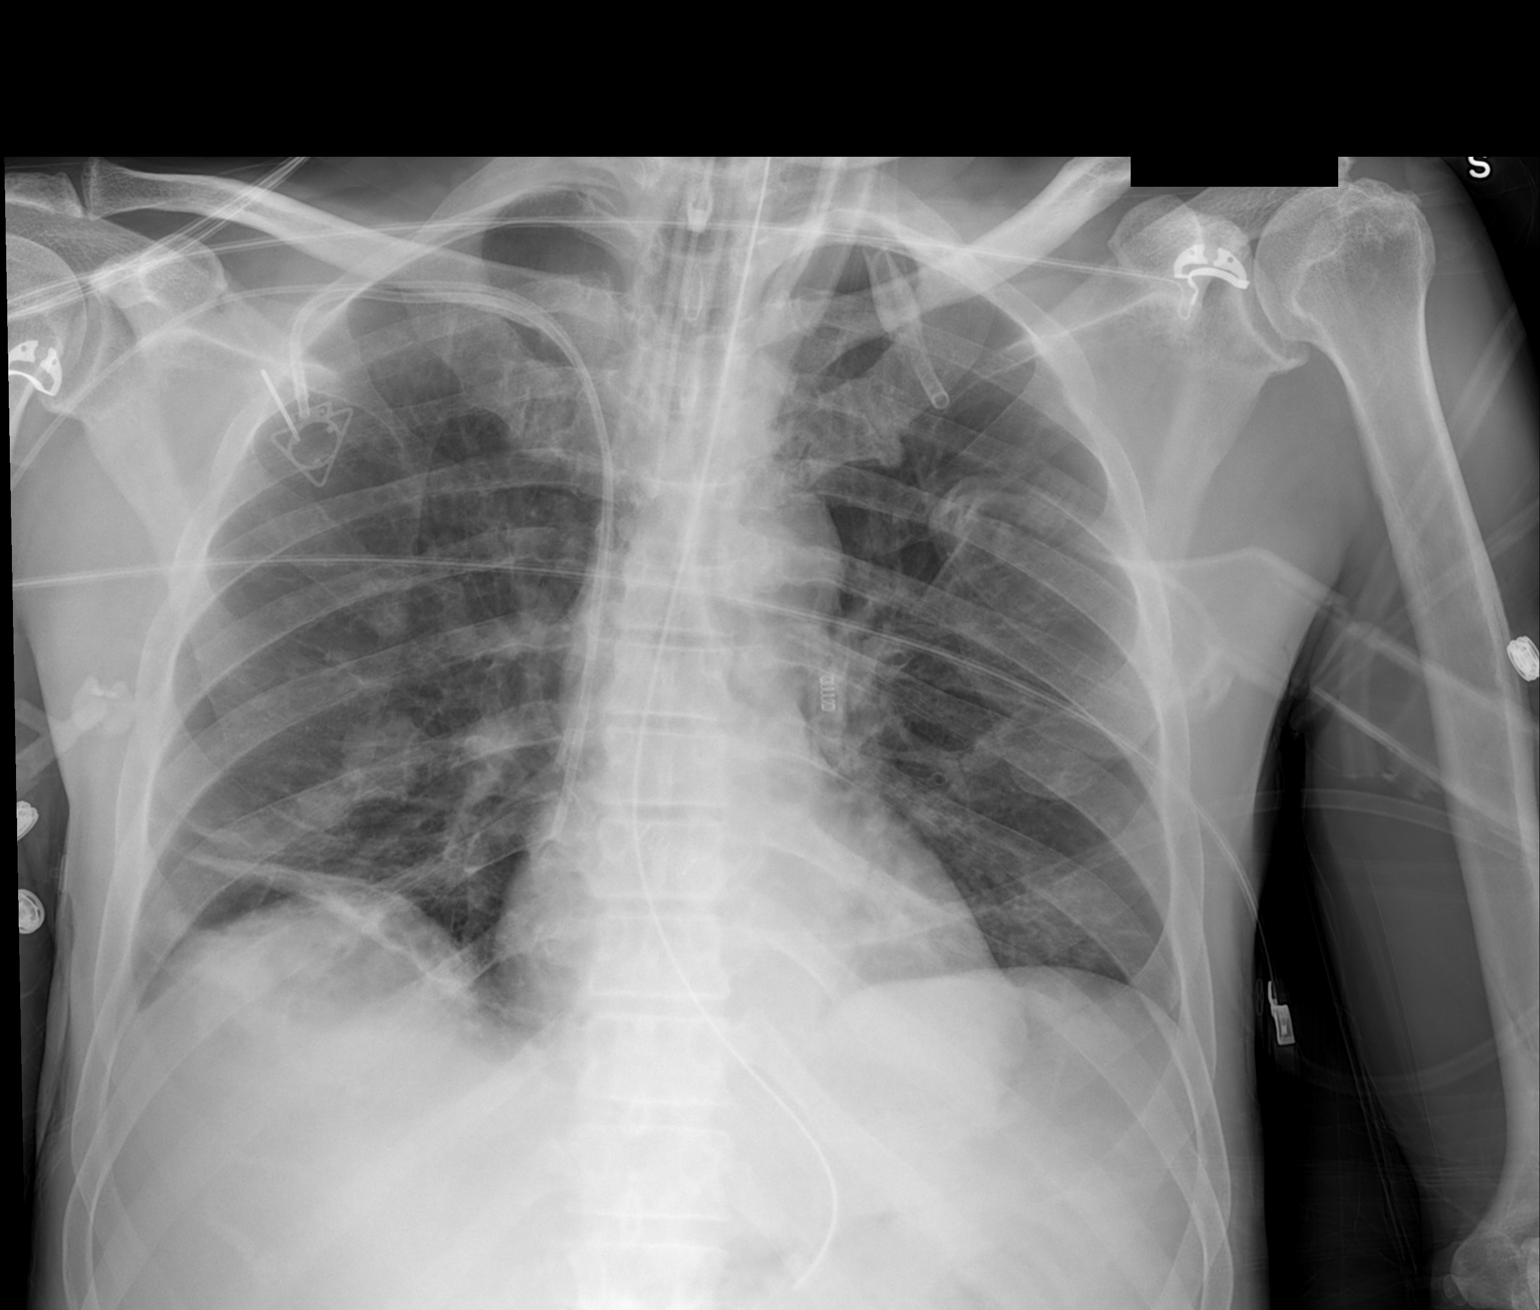

[1 of 1 positions shown; findings below may reference images not displayed]

FINDINGS: Endotracheal tube terminates 3 cm above the carina.

Enteric tube courses below the diaphragm.  Right chest power port
and right arm PICC terminates in the lower SVC / cavoatrial
junction.

Right lung metastases, the largest measuring approximate 2.4 cm in
the right lower lung.  Known left lung metastases are not well
visualized radiographically.  Additional patchy right lower lobe
opacity, likely atelectasis with possible small right pleural
effusion.

No pneumothorax.

The heart is normal in size.
IMPRESSION: Endotracheal tube terminates 3 cm above the carina.  Additional
support apparatus as above.

Right lung metastases.  Known left lung metastases are not well
visualized radiographically.

Patchy right lower lobe opacity, likely atelectasis with a possible
small right pleural effusion.

## 2013-03-04 IMAGING — CR DG ABDOMEN 1V
1 series · 1 of 1 positions shown · non-contrast
Comparison: Portable abdomen x-rays 11/09/2011, 11/08/2011.

CLINICAL DATA: Nasogastric tube placement.

PORTABLE ABDOMEN - 1 VIEW

[ap (kub)]
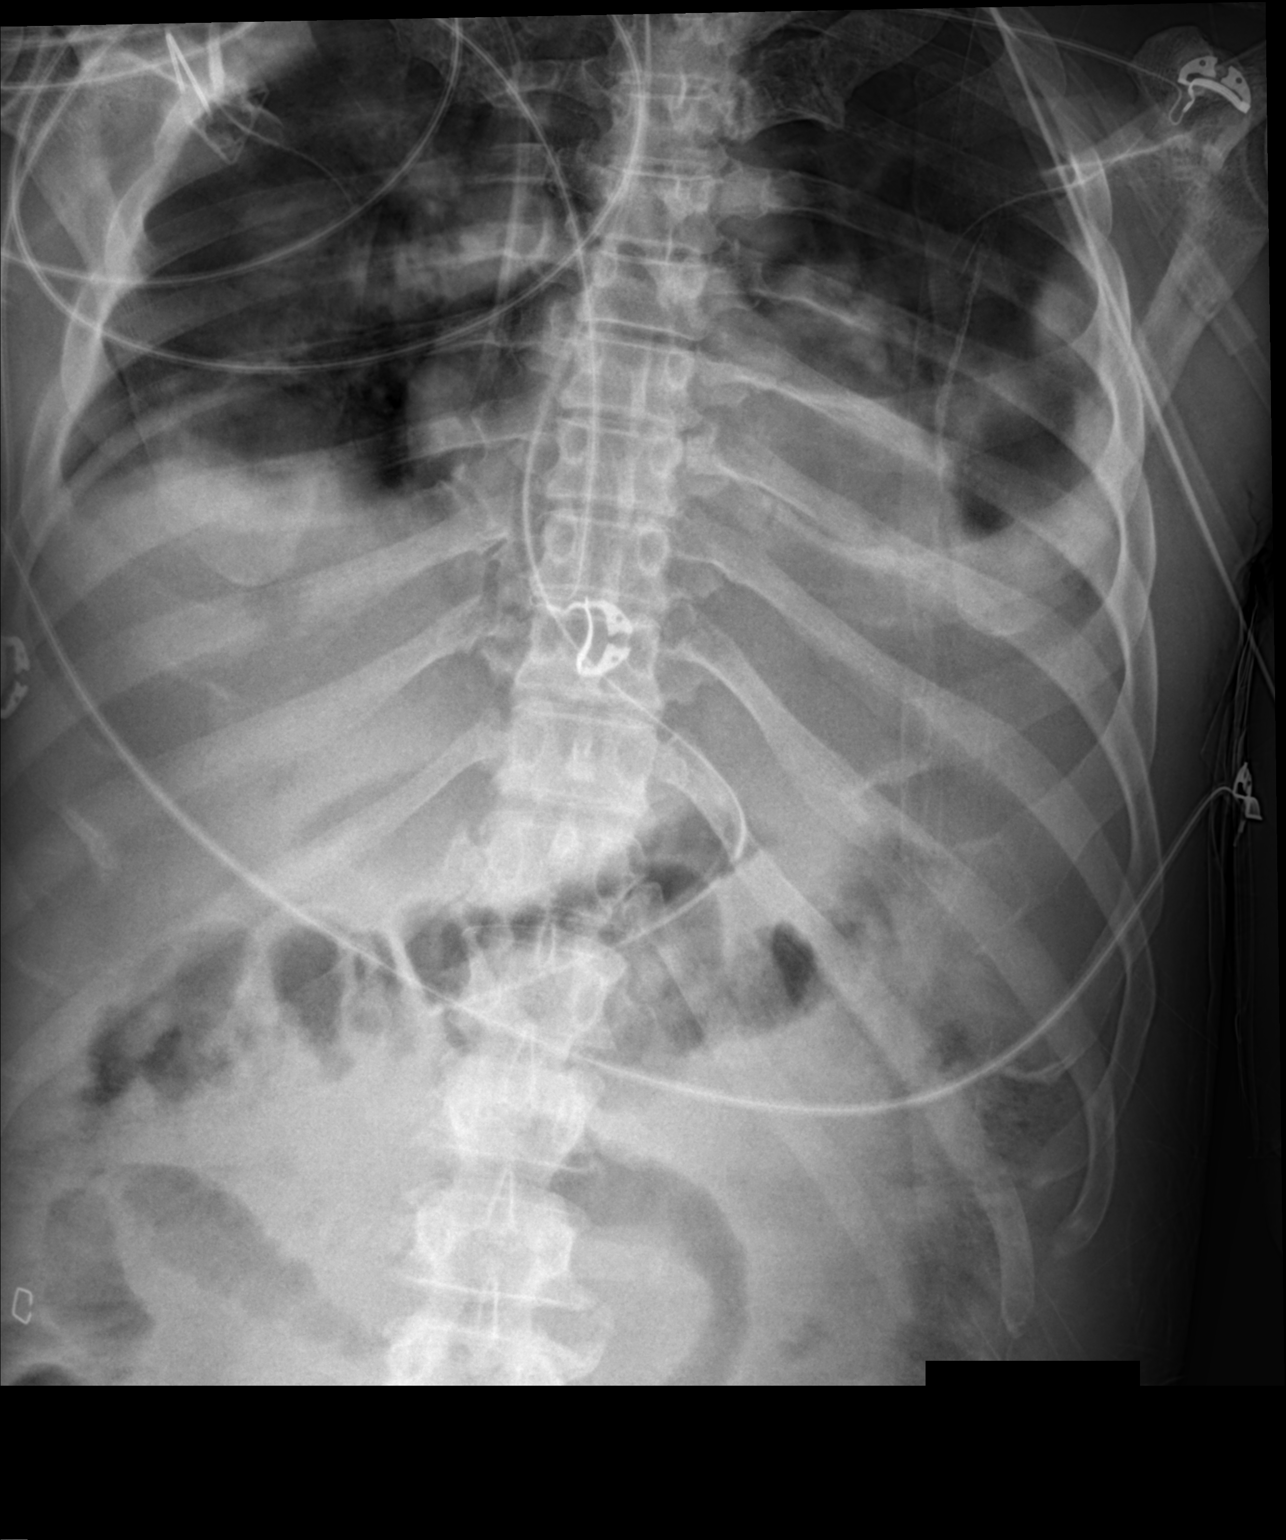

[1 of 1 positions shown; findings below may reference images not displayed]

FINDINGS: Nasogastric tube tip overlies the expected location of
the distal body of the stomach.  Bowel gas pattern much improved
from prior examinations.
IMPRESSION: Nasogastric tube tip at the expected location of the distal body of
the stomach.

## 2018-04-27 ENCOUNTER — Inpatient Hospital Stay (HOSPITAL_COMMUNITY)
Admission: EM | Admit: 2018-04-27 | Discharge: 2018-04-27 | Disposition: A | Discharge status: Home / Self Care | Payer: Medicaid Other | Source: Other Acute Inpatient Hospital

## 2018-04-27 DIAGNOSIS — R4182 Altered mental status, unspecified: Secondary | ICD-10-CM

## 2018-04-27 DIAGNOSIS — A419 Sepsis, unspecified organism: Secondary | ICD-10-CM

## 2018-04-28 ENCOUNTER — Inpatient Hospital Stay (HOSPITAL_COMMUNITY): Payer: Medicaid Other

## 2018-04-28 ENCOUNTER — Inpatient Hospital Stay
Admission: EM | Admit: 2018-04-28 | Discharge: 2018-05-25 | DRG: 005 | Disposition: A | Discharge status: Hospice | Payer: Medicaid Other | Source: Other Acute Inpatient Hospital | Attending: SURGICAL CRITICAL CARE | Admitting: SURGICAL CRITICAL CARE

## 2018-04-28 ENCOUNTER — Inpatient Hospital Stay (HOSPITAL_COMMUNITY): Payer: Medicaid Other | Admitting: Surgery

## 2018-04-28 DIAGNOSIS — R4182 Altered mental status, unspecified: Secondary | ICD-10-CM

## 2018-04-28 DIAGNOSIS — R778 Other specified abnormalities of plasma proteins: Secondary | ICD-10-CM | POA: Diagnosis present

## 2018-04-28 DIAGNOSIS — E871 Hypo-osmolality and hyponatremia: Secondary | ICD-10-CM | POA: Diagnosis not present

## 2018-04-28 DIAGNOSIS — Z66 Do not resuscitate: Secondary | ICD-10-CM | POA: Diagnosis not present

## 2018-04-28 DIAGNOSIS — R579 Shock, unspecified: Secondary | ICD-10-CM

## 2018-04-28 DIAGNOSIS — J9601 Acute respiratory failure with hypoxia: Principal | ICD-10-CM | POA: Diagnosis present

## 2018-04-28 DIAGNOSIS — J9602 Acute respiratory failure with hypercapnia: Secondary | ICD-10-CM | POA: Diagnosis present

## 2018-04-28 DIAGNOSIS — S128XXA Fracture of other parts of neck, initial encounter: Secondary | ICD-10-CM

## 2018-04-28 DIAGNOSIS — R7989 Other specified abnormal findings of blood chemistry: Secondary | ICD-10-CM | POA: Diagnosis present

## 2018-04-28 DIAGNOSIS — S42291A Other displaced fracture of upper end of right humerus, initial encounter for closed fracture: Secondary | ICD-10-CM

## 2018-04-28 DIAGNOSIS — K4090 Unilateral inguinal hernia, without obstruction or gangrene, not specified as recurrent: Secondary | ICD-10-CM | POA: Diagnosis present

## 2018-04-28 DIAGNOSIS — W19XXXA Unspecified fall, initial encounter: Secondary | ICD-10-CM | POA: Diagnosis present

## 2018-04-28 DIAGNOSIS — S42211A Unspecified displaced fracture of surgical neck of right humerus, initial encounter for closed fracture: Secondary | ICD-10-CM

## 2018-04-28 DIAGNOSIS — I4891 Unspecified atrial fibrillation: Secondary | ICD-10-CM | POA: Diagnosis present

## 2018-04-28 DIAGNOSIS — S022XXA Fracture of nasal bones, initial encounter for closed fracture: Secondary | ICD-10-CM | POA: Diagnosis present

## 2018-04-28 DIAGNOSIS — J449 Chronic obstructive pulmonary disease, unspecified: Secondary | ICD-10-CM | POA: Diagnosis present

## 2018-04-28 DIAGNOSIS — M7989 Other specified soft tissue disorders: Secondary | ICD-10-CM

## 2018-04-28 DIAGNOSIS — Z9911 Dependence on respirator [ventilator] status: Secondary | ICD-10-CM | POA: Diagnosis present

## 2018-04-28 DIAGNOSIS — S42309A Unspecified fracture of shaft of humerus, unspecified arm, initial encounter for closed fracture: Secondary | ICD-10-CM | POA: Diagnosis present

## 2018-04-28 DIAGNOSIS — D649 Anemia, unspecified: Secondary | ICD-10-CM | POA: Diagnosis present

## 2018-04-28 DIAGNOSIS — I639 Cerebral infarction, unspecified: Secondary | ICD-10-CM

## 2018-04-28 DIAGNOSIS — K4030 Unilateral inguinal hernia, with obstruction, without gangrene, not specified as recurrent: Secondary | ICD-10-CM | POA: Diagnosis present

## 2018-04-28 DIAGNOSIS — R627 Adult failure to thrive: Secondary | ICD-10-CM | POA: Diagnosis not present

## 2018-04-28 DIAGNOSIS — J440 Chronic obstructive pulmonary disease with acute lower respiratory infection: Secondary | ICD-10-CM | POA: Diagnosis not present

## 2018-04-28 DIAGNOSIS — D696 Thrombocytopenia, unspecified: Secondary | ICD-10-CM | POA: Diagnosis not present

## 2018-04-28 DIAGNOSIS — J15211 Pneumonia due to Methicillin susceptible Staphylococcus aureus: Secondary | ICD-10-CM | POA: Diagnosis not present

## 2018-04-28 DIAGNOSIS — I517 Cardiomegaly: Secondary | ICD-10-CM

## 2018-04-28 DIAGNOSIS — J9600 Acute respiratory failure, unspecified whether with hypoxia or hypercapnia: Secondary | ICD-10-CM | POA: Diagnosis present

## 2018-04-28 DIAGNOSIS — T1490XA Injury, unspecified, initial encounter: Secondary | ICD-10-CM

## 2018-04-28 DIAGNOSIS — F1010 Alcohol abuse, uncomplicated: Secondary | ICD-10-CM | POA: Diagnosis present

## 2018-04-28 DIAGNOSIS — T07XXXA Unspecified multiple injuries, initial encounter: Secondary | ICD-10-CM | POA: Diagnosis present

## 2018-04-28 DIAGNOSIS — I959 Hypotension, unspecified: Secondary | ICD-10-CM

## 2018-04-28 DIAGNOSIS — F10231 Alcohol dependence with withdrawal delirium: Secondary | ICD-10-CM | POA: Diagnosis present

## 2018-04-28 DIAGNOSIS — N390 Urinary tract infection, site not specified: Secondary | ICD-10-CM | POA: Diagnosis present

## 2018-04-28 DIAGNOSIS — E873 Alkalosis: Secondary | ICD-10-CM | POA: Diagnosis not present

## 2018-04-28 DIAGNOSIS — R601 Generalized edema: Secondary | ICD-10-CM | POA: Diagnosis present

## 2018-04-28 DIAGNOSIS — X58XXXA Exposure to other specified factors, initial encounter: Secondary | ICD-10-CM

## 2018-04-28 DIAGNOSIS — E876 Hypokalemia: Secondary | ICD-10-CM | POA: Diagnosis present

## 2018-04-28 DIAGNOSIS — Z6829 Body mass index (BMI) 29.0-29.9, adult: Secondary | ICD-10-CM

## 2018-04-28 DIAGNOSIS — K130 Diseases of lips: Secondary | ICD-10-CM | POA: Diagnosis not present

## 2018-04-28 DIAGNOSIS — D62 Acute posthemorrhagic anemia: Secondary | ICD-10-CM | POA: Diagnosis present

## 2018-04-28 DIAGNOSIS — Z515 Encounter for palliative care: Secondary | ICD-10-CM | POA: Diagnosis not present

## 2018-04-28 DIAGNOSIS — J9690 Respiratory failure, unspecified, unspecified whether with hypoxia or hypercapnia: Secondary | ICD-10-CM

## 2018-04-28 DIAGNOSIS — S0101XA Laceration without foreign body of scalp, initial encounter: Secondary | ICD-10-CM | POA: Diagnosis present

## 2018-04-28 DIAGNOSIS — Z9149 Other personal history of psychological trauma, not elsewhere classified: Secondary | ICD-10-CM

## 2018-04-28 DIAGNOSIS — E43 Unspecified severe protein-calorie malnutrition: Secondary | ICD-10-CM | POA: Diagnosis present

## 2018-04-28 DIAGNOSIS — S42201A Unspecified fracture of upper end of right humerus, initial encounter for closed fracture: Secondary | ICD-10-CM

## 2018-04-28 DIAGNOSIS — S0003XA Contusion of scalp, initial encounter: Secondary | ICD-10-CM

## 2018-04-28 DIAGNOSIS — M25521 Pain in right elbow: Secondary | ICD-10-CM

## 2018-04-28 HISTORY — DX: Unspecified atrial fibrillation (CMS HCC): I48.91

## 2018-04-28 HISTORY — DX: Chronic obstructive pulmonary disease, unspecified (CMS HCC): J44.9

## 2018-04-28 HISTORY — DX: Alcohol abuse, uncomplicated: F10.10

## 2018-04-28 LAB — URINALYSIS, MACROSCOPIC
GLUCOSE: NEGATIVE mg/dL
KETONES: NEGATIVE mg/dL
NITRITE: NEGATIVE
PH: 5 (ref 5.0–8.0)
PROTEIN: 100 mg/dL — AB
SPECIFIC GRAVITY: 1.044 — ABNORMAL HIGH (ref 1.005–1.030)
UROBILINOGEN: 4 mg/dL — AB

## 2018-04-28 LAB — URINALYSIS, MICROSCOPIC
GRANULAR CASTS: 69 /LPF — ABNORMAL HIGH (ref <=0)
HYALINE CASTS: 119 /LPF — ABNORMAL HIGH (ref <4.0)
RBCS: 21 /HPF — ABNORMAL HIGH (ref <6.0)
WBCS: 143 /HPF — ABNORMAL HIGH (ref <4.0)

## 2018-04-28 LAB — AMMONIA: AMMONIA: 35 umol/L (ref 15–50)

## 2018-04-28 LAB — CBC WITH DIFF
BASOPHIL #: <0.1 10*3/uL (ref <=0.20)
BASOPHIL %: 0 %
EOSINOPHIL #: <0.1 10*3/uL (ref <=0.50)
EOSINOPHIL %: 1 %
HCT: 32.2 % — ABNORMAL LOW (ref 38.9–52.0)
HGB: 9.4 g/dL — ABNORMAL LOW (ref 13.4–17.5)
IMMATURE GRANULOCYTE #: <0.1 10*3/uL (ref <0.10)
IMMATURE GRANULOCYTE %: 0 % (ref 0–1)
LYMPHOCYTE #: 0.98 x10ˆ3/uL — ABNORMAL LOW (ref 1.00–4.80)
LYMPHOCYTE %: 11 %
MCH: 26.3 pg (ref 26.0–32.0)
MCHC: 29.2 g/dL — ABNORMAL LOW (ref 31.0–35.5)
MCV: 89.9 fL (ref 78.0–100.0)
MONOCYTE #: 0.78 x10ˆ3/uL (ref 0.20–1.10)
MONOCYTE %: 9 %
MPV: 10.8 fL (ref 8.7–12.5)
NEUTROPHIL #: 7.12 x10ˆ3/uL (ref 1.50–7.70)
NEUTROPHIL %: 79 %
PLATELETS: 185 10*3/uL (ref 150–400)
RBC: 3.58 10*6/uL — ABNORMAL LOW (ref 4.50–6.10)
RDW-CV: 23.1 % — ABNORMAL HIGH (ref 11.5–15.5)
WBC: 9 10*3/uL (ref 3.7–11.0)

## 2018-04-28 LAB — BASIC METABOLIC PANEL
ANION GAP: 9 mmol/L (ref 4–13)
BUN/CREA RATIO: 4 — ABNORMAL LOW (ref 6–22)
BUN: 3 mg/dL — ABNORMAL LOW (ref 8–25)
CALCIUM: 7.3 mg/dL — ABNORMAL LOW (ref 8.5–10.2)
CHLORIDE: 107 mmol/L (ref 96–111)
CO2 TOTAL: 21 mmol/L — ABNORMAL LOW (ref 22–32)
CREATININE: 0.72 mg/dL (ref 0.62–1.27)
ESTIMATED GFR: >60 mL/min/1.73mˆ2 (ref >60)
GLUCOSE: 119 mg/dL (ref 65–139)
GLUCOSE: 119 mg/dL (ref 65–139)
POTASSIUM: 3.6 mmol/L (ref 3.5–5.1)
SODIUM: 137 mmol/L (ref 136–145)

## 2018-04-28 LAB — CREATINE KINASE (CK), TOTAL, SERUM OR PLASMA
CREATINE KINASE: 402 U/L — ABNORMAL HIGH (ref 45–225)
CREATINE KINASE: 368 U/L — ABNORMAL HIGH (ref 45–225)

## 2018-04-28 LAB — PT/INR
INR: 1.39 — ABNORMAL HIGH (ref 0.80–1.20)
PROTHROMBIN TIME: 16.1 s — ABNORMAL HIGH (ref 9.1–13.9)

## 2018-04-28 LAB — TROPONIN-I
TROPONIN I: 124 ng/L — ABNORMAL HIGH (ref 0–30)
TROPONIN I: 130 ng/L — ABNORMAL HIGH (ref 0–30)

## 2018-04-28 LAB — ARTERIAL BLOOD GAS/LACTATE
%FIO2 (ARTERIAL): 40 %
BASE EXCESS (ARTERIAL): 0.3 mmol/L (ref 0.0–1.0)
BICARBONATE (ARTERIAL): 25.2 mmol/L (ref 18.0–26.0)
LACTATE: 2 mmol/L — ABNORMAL HIGH (ref 0.0–1.3)
PAO2/FIO2 RATIO: 403 (ref <=200)
PCO2 (ARTERIAL): 42 mmHg (ref 35.0–45.0)
PH (ARTERIAL): 7.39 (ref 7.35–7.45)
PO2 (ARTERIAL): 161 mmHg — ABNORMAL HIGH (ref 72.0–100.0)

## 2018-04-28 LAB — LACTIC ACID LEVEL: LACTIC ACID: 3.5 mmol/L — ABNORMAL HIGH (ref 0.5–2.2)

## 2018-04-28 LAB — BUN: BUN: 3 mg/dL — ABNORMAL LOW (ref 8–25)

## 2018-04-28 LAB — PHOSPHORUS: PHOSPHORUS: 2.5 mg/dL (ref 2.4–4.7)

## 2018-04-28 LAB — ARTERIAL BLOOD GAS - MLS OP ONLY
BASE EXCESS (ARTERIAL): 0.1 mmol/L (ref 0.0–1.0)
BICARBONATE (ARTERIAL): 25.1 mmol/L (ref 18.0–26.0)
PCO2 (ARTERIAL): 46 mmHg — ABNORMAL HIGH (ref 35.0–45.0)
PH (ARTERIAL): 7.36 (ref 7.35–7.45)
PO2 (ARTERIAL): 159 mmHg — ABNORMAL HIGH (ref 72.0–100.0)

## 2018-04-28 LAB — CREATININE WITH EGFR
CREATININE: 0.72 mg/dL (ref 0.62–1.27)
ESTIMATED GFR: >60 mL/min/1.73mˆ2 (ref >60)

## 2018-04-28 LAB — ECG 12-LEAD
Atrial Rate: 111 {beats}/min
Calculated P Axis: 50 degrees
Calculated R Axis: 40 degrees
Calculated R Axis: 40 degrees
Calculated T Axis: -129 degrees
PR Interval: 160 ms
QRS Duration: 84 ms
QT Interval: 400 ms
QTC Calculation: 544 ms
Ventricular rate: 111 {beats}/min

## 2018-04-28 LAB — POC BLOOD GLUCOSE (RESULTS): GLUCOSE, POC: 124 mg/dL — ABNORMAL HIGH (ref 70–105)

## 2018-04-28 LAB — HEPATIC FUNCTION PANEL
ALBUMIN: 1.8 g/dL — ABNORMAL LOW (ref 3.5–5.0)
ALKALINE PHOSPHATASE: 238 U/L — ABNORMAL HIGH (ref 45–115)
ALT (SGPT): 33 U/L (ref <55)
AST (SGOT): 55 U/L — ABNORMAL HIGH (ref 8–48)
BILIRUBIN DIRECT: 1.2 mg/dL — ABNORMAL HIGH (ref <0.3)
BILIRUBIN TOTAL: 1.6 mg/dL — ABNORMAL HIGH (ref 0.3–1.3)
PROTEIN TOTAL: 5.7 g/dL — ABNORMAL LOW (ref 6.4–8.3)

## 2018-04-28 LAB — PTT (PARTIAL THROMBOPLASTIN TIME): APTT: 27.8 s (ref 24.2–37.5)

## 2018-04-28 LAB — MORPHOLOGY: RBC MORPHOLOGY: NORMAL

## 2018-04-28 LAB — MAGNESIUM: MAGNESIUM: 1.7 mg/dL (ref 1.6–2.6)

## 2018-04-28 LAB — ETHANOL, SERUM: ETHANOL: NOT DETECTED

## 2018-04-28 LAB — ARTERIAL BLOOD GAS
%FIO2 (ARTERIAL): 50 %
PAO2/FIO2 RATIO: 318 (ref <=200)

## 2018-04-28 LAB — ETHANOL, SERUM/PLASMA: ETHANOL: <10 mg/dL (ref <10)

## 2018-04-28 LAB — LIPASE: LIPASE: 6 U/L — ABNORMAL LOW (ref 10–80)

## 2018-04-28 LAB — GLUCOSE, NON FASTING: GLUCOSE: 120 mg/dL (ref 65–139)

## 2018-04-28 MED ORDER — DOCUSATE SODIUM 100 MG CAPSULE
100.0000 mg | ORAL_CAPSULE | Freq: Two times a day (BID) | ORAL | Status: DC
Start: 2018-04-28 — End: 2018-04-29
  Administered 2018-04-28: 0 mg via ORAL

## 2018-04-28 MED ORDER — ENOXAPARIN 40 MG/0.4 ML SUBCUTANEOUS SYRINGE
40.0000 mg | INJECTION | Freq: Two times a day (BID) | SUBCUTANEOUS | Status: DC
Start: 2018-04-28 — End: 2018-05-01
  Administered 2018-04-28 – 2018-05-01 (×6): 40 mg via SUBCUTANEOUS
  Filled 2018-04-28 (×6): qty 0.4

## 2018-04-28 MED ORDER — LORAZEPAM 2 MG/ML INJECTION SOLUTION
0.5000 mg | Freq: Three times a day (TID) | INTRAMUSCULAR | Status: DC
Start: 2018-05-02 — End: 2018-04-30

## 2018-04-28 MED ORDER — FENTANYL (PF) 50 MCG/ML INJECTION SOLUTION
INTRAMUSCULAR | Status: DC
Start: 2018-04-28 — End: 2018-04-29
  Filled 2018-04-28: qty 2

## 2018-04-28 MED ORDER — LORAZEPAM 2 MG/ML INJECTION SOLUTION
2.0000 mg | Freq: Three times a day (TID) | INTRAMUSCULAR | Status: AC
Start: 2018-04-28 — End: 2018-04-30
  Administered 2018-04-28 – 2018-04-30 (×6): 2 mg via INTRAVENOUS
  Filled 2018-04-28 (×6): qty 1

## 2018-04-28 MED ORDER — SODIUM CHLORIDE 0.9 % (FLUSH) INJECTION SYRINGE
2.0000 mL | INJECTION | Freq: Three times a day (TID) | INTRAMUSCULAR | Status: DC
Start: 2018-04-28 — End: 2018-05-25
  Administered 2018-04-28 – 2018-04-30 (×7): 2 mL
  Administered 2018-04-30 – 2018-05-01 (×2): 0 mL
  Administered 2018-05-01: 2 mL
  Administered 2018-05-01: 0 mL
  Administered 2018-05-02: 2 mL
  Administered 2018-05-02: 0 mL
  Administered 2018-05-02 – 2018-05-03 (×3): 2 mL
  Administered 2018-05-03 – 2018-05-05 (×4): 0 mL
  Administered 2018-05-05 – 2018-05-06 (×3): 2 mL
  Administered 2018-05-06 – 2018-05-08 (×5): 0 mL
  Administered 2018-05-08: 2 mL
  Administered 2018-05-08: 0 mL
  Administered 2018-05-09: 2 mL
  Administered 2018-05-09 – 2018-05-10 (×3): 0 mL
  Administered 2018-05-10 (×2): 2 mL
  Administered 2018-05-11: 0 mL
  Administered 2018-05-11 (×2): 2 mL
  Administered 2018-05-12: 10 mL
  Administered 2018-05-12: 0 mL
  Administered 2018-05-12: 2 mL
  Administered 2018-05-13: 10 mL
  Administered 2018-05-13: 14:00:00 0 mL
  Administered 2018-05-13 – 2018-05-15 (×5): 2 mL
  Administered 2018-05-15: 0 mL
  Administered 2018-05-15 – 2018-05-17 (×7): 2 mL
  Administered 2018-05-18: 22:00:00
  Administered 2018-05-18: 0 mL
  Administered 2018-05-18 – 2018-05-19 (×2): 2 mL
  Administered 2018-05-19: 0 mL
  Administered 2018-05-20: 2 mL
  Administered 2018-05-20 – 2018-05-21 (×3): 0 mL
  Administered 2018-05-21 – 2018-05-22 (×5): 2 mL
  Administered 2018-05-23: 13:00:00 0 mL
  Administered 2018-05-23 – 2018-05-24 (×5): 2 mL
  Administered 2018-05-25: 0 mL

## 2018-04-28 MED ORDER — ROCURONIUM 10 MG/ML INTRAVENOUS SOLUTION
INTRAVENOUS | Status: DC
Start: 2018-04-28 — End: 2018-04-29
  Filled 2018-04-28: qty 5

## 2018-04-28 MED ORDER — MIDAZOLAM 1 MG/ML INJECTION SOLUTION
INTRAMUSCULAR | Status: DC
Start: 2018-04-28 — End: 2018-04-29
  Filled 2018-04-28: qty 10

## 2018-04-28 MED ORDER — LORAZEPAM 2 MG/ML INJECTION SOLUTION
1.0000 mg | Freq: Three times a day (TID) | INTRAMUSCULAR | Status: DC
Start: 2018-04-30 — End: 2018-04-30
  Administered 2018-04-30: 1 mg via INTRAVENOUS
  Filled 2018-04-28: qty 1

## 2018-04-28 MED ORDER — MAGNESIUM HYDROXIDE 400 MG/5 ML ORAL SUSPENSION
15.0000 mL | ORAL | Status: DC
Start: 2018-05-01 — End: 2018-04-28

## 2018-04-28 MED ORDER — VANCOMYCIN IV - PHARMACIST TO DOSE PER PROTOCOL - NO EXCLUSION CRITERIA
Freq: Every day | Status: DC | PRN
Start: 2018-04-28 — End: 2018-04-29

## 2018-04-28 MED ORDER — SODIUM CHLORIDE 0.9 % INTRAVENOUS SOLUTION
250.0000 mg | INTRAVENOUS | Status: DC
Start: 2018-04-30 — End: 2018-05-01
  Administered 2018-04-30: 0 mg via INTRAVENOUS
  Administered 2018-04-30: 250 mg via INTRAVENOUS
  Filled 2018-04-28: qty 2.5

## 2018-04-28 MED ORDER — MIDAZOLAM 1 MG/ML INJECTION SOLUTION
Freq: Once | INTRAMUSCULAR | Status: DC | PRN
Start: 2018-04-28 — End: 2018-04-29
  Administered 2018-04-28: 5 mg via INTRAVENOUS

## 2018-04-28 MED ORDER — CYANOCOBALAMIN (VIT B-12) 1,000 MCG/ML INJECTION SOLUTION
1000.0000 ug | Freq: Every day | INTRAMUSCULAR | Status: DC
Start: 2018-04-28 — End: 2018-05-01
  Administered 2018-04-28 – 2018-05-01 (×4): 1000 ug via SUBCUTANEOUS
  Filled 2018-04-28 (×4): qty 1

## 2018-04-28 MED ORDER — THIAMINE HCL (VITAMIN B1) 100 MG/ML INJECTION SOLUTION
500.0000 mg | Freq: Three times a day (TID) | INTRAMUSCULAR | Status: AC
Start: 2018-04-28 — End: 2018-04-30
  Administered 2018-04-28: 0 mg via INTRAVENOUS
  Administered 2018-04-28: 500 mg via INTRAVENOUS
  Administered 2018-04-29: 0 mg via INTRAVENOUS
  Administered 2018-04-29: 500 mg via INTRAVENOUS
  Administered 2018-04-29: 0 mg via INTRAVENOUS
  Administered 2018-04-29: 500 mg via INTRAVENOUS
  Administered 2018-04-29: 0 mg via INTRAVENOUS
  Administered 2018-04-29 – 2018-04-30 (×3): 500 mg via INTRAVENOUS
  Administered 2018-04-30 (×2): 0 mg via INTRAVENOUS
  Filled 2018-04-28 (×6): qty 5

## 2018-04-28 MED ORDER — ELECTROLYTE-A INTRAVENOUS SOLUTION
INTRAVENOUS | Status: DC
Start: 2018-04-28 — End: 2018-04-28

## 2018-04-28 MED ORDER — SODIUM CHLORIDE 0.9 % INTRAVENOUS SOLUTION
15.0000 mg/kg | Freq: Two times a day (BID) | INTRAVENOUS | Status: DC
Start: 2018-04-29 — End: 2018-04-29
  Administered 2018-04-29: 0 mg via INTRAVENOUS
  Administered 2018-04-29: 1250 mg via INTRAVENOUS
  Administered 2018-04-29: 05:00:00 0 mg via INTRAVENOUS
  Filled 2018-04-28 (×2): qty 12.5

## 2018-04-28 MED ORDER — INSULIN REGULAR HUMAN 100 UNIT/ML INJECTION SSIP
0.00 [IU] | INJECTION | Freq: Four times a day (QID) | SUBCUTANEOUS | Status: DC | PRN
Start: 2018-04-28 — End: 2018-04-29

## 2018-04-28 MED ORDER — CEFEPIME 2 GRAM SOLUTION FOR INJECTION
2.0000 g | Freq: Three times a day (TID) | INTRAMUSCULAR | Status: DC
Start: 2018-04-29 — End: 2018-04-29
  Administered 2018-04-29: 2 g via INTRAVENOUS
  Administered 2018-04-29 (×2): 0 g via INTRAVENOUS
  Administered 2018-04-29: 2 g via INTRAVENOUS
  Filled 2018-04-28 (×2): qty 12.5

## 2018-04-28 MED ORDER — LORAZEPAM 2 MG/ML INJECTION SOLUTION
2.0000 mg | INTRAMUSCULAR | Status: DC | PRN
Start: 2018-04-28 — End: 2018-04-28

## 2018-04-28 MED ORDER — FENTANYL (PF) 50 MCG/ML INJECTION SOLUTION
Freq: Once | INTRAMUSCULAR | Status: DC | PRN
Start: 2018-04-28 — End: 2018-04-29
  Administered 2018-04-28: 100 ug via INTRAVENOUS
  Administered 2018-04-28: 50 ug via INTRAVENOUS
  Administered 2018-04-28: 100 ug via INTRAVENOUS

## 2018-04-28 MED ORDER — ELECTROLYTE-A INTRAVENOUS SOLUTION
INTRAVENOUS | Status: DC
Start: 2018-04-28 — End: 2018-05-01

## 2018-04-28 MED ORDER — FAMOTIDINE (PF) 20 MG/2 ML INTRAVENOUS SOLUTION
20.0000 mg | Freq: Two times a day (BID) | INTRAVENOUS | Status: DC
Start: 2018-04-28 — End: 2018-04-29
  Administered 2018-04-28: 20 mg via INTRAVENOUS
  Filled 2018-04-28 (×3): qty 2

## 2018-04-28 MED ORDER — LORAZEPAM 2 MG/ML INJECTION SOLUTION
1.0000 mg | INTRAMUSCULAR | Status: DC | PRN
Start: 2018-04-28 — End: 2018-04-28

## 2018-04-28 MED ORDER — ALBUMIN, HUMAN 5 % INTRAVENOUS SOLUTION
25.0000 g | Freq: Once | INTRAVENOUS | Status: AC
Start: 2018-04-29 — End: 2018-04-28
  Administered 2018-04-28: 25 g via INTRAVENOUS
  Administered 2018-04-28: 0 g via INTRAVENOUS
  Filled 2018-04-28 (×2): qty 250

## 2018-04-28 MED ORDER — NOREPINEPHRINE BITARTRATE 1 MG/ML INTRAVENOUS SOLUTION
INTRAVENOUS | Status: DC
Start: 2018-04-28 — End: 2018-04-29
  Filled 2018-04-28: qty 32

## 2018-04-28 MED ORDER — INSULIN REGULAR HUMAN 100 UNIT/ML INJECTION SSIP
0.0000 [IU] | INJECTION | Freq: Four times a day (QID) | SUBCUTANEOUS | Status: DC | PRN
Start: 2018-04-28 — End: 2018-04-28
  Filled 2018-04-28: qty 3

## 2018-04-28 MED ORDER — THIAMINE HCL (VITAMIN B1) 100 MG TABLET
50.0000 mg | ORAL_TABLET | Freq: Two times a day (BID) | ORAL | Status: DC
Start: 2018-05-04 — End: 2018-05-01

## 2018-04-28 MED ORDER — SENNA LEAF EXTRACT 176 MG/5 ML ORAL SYRUP
5.0000 mL | ORAL_SOLUTION | Freq: Two times a day (BID) | ORAL | Status: DC
Start: 2018-04-28 — End: 2018-04-29
  Administered 2018-04-28: 176 mg via ORAL
  Filled 2018-04-28: qty 15

## 2018-04-28 MED ORDER — ELECTROLYTE-A INTRAVENOUS SOLUTION BOLUS
1000.0000 mL | Freq: Once | INTRAVENOUS | Status: AC
Start: 2018-04-28 — End: 2018-04-28
  Administered 2018-04-28: 1000 mL via INTRAVENOUS
  Administered 2018-04-28: 0 mL via INTRAVENOUS

## 2018-04-28 MED ORDER — SODIUM CHLORIDE 0.9 % INTRAVENOUS SOLUTION
100.0000 mg | INTRAVENOUS | Status: DC
Start: 2018-04-28 — End: 2018-04-28
  Administered 2018-04-28: 0 mg via INTRAVENOUS
  Filled 2018-04-28: qty 1

## 2018-04-28 MED ORDER — SODIUM CHLORIDE 0.9 % (FLUSH) INJECTION SYRINGE
2.0000 mL | INJECTION | INTRAMUSCULAR | Status: DC | PRN
Start: 2018-04-28 — End: 2018-05-25
  Administered 2018-05-17 – 2018-05-23 (×2): 2 mL
  Filled 2018-04-28 (×2): qty 10

## 2018-04-28 MED ORDER — SODIUM CHLORIDE 0.9 % INTRAVENOUS PIGGYBACK
2.0000 g | INJECTION | INTRAVENOUS | Status: AC
Start: 2018-04-28 — End: 2018-04-28
  Administered 2018-04-28: 0 g via INTRAVENOUS
  Administered 2018-04-28: 2 g via INTRAVENOUS
  Filled 2018-04-28: qty 12.5

## 2018-04-28 MED ORDER — SODIUM CHLORIDE 0.9 % INJECTION SOLUTION
2.00 mL | INTRAMUSCULAR | Status: DC
Start: 2018-04-28 — End: 2018-04-30

## 2018-04-28 MED ADMIN — senna leaf extract 176 mg/5 mL oral syrup: ORAL | @ 20:00:00

## 2018-04-28 MED ADMIN — famotidine (PF) 20 mg/2 mL intravenous solution: INTRAVENOUS | @ 20:00:00

## 2018-04-28 MED ADMIN — docusate sodium 100 mg capsule: ORAL | @ 21:00:00

## 2018-04-28 MED ADMIN — midazolam 1 mg/mL injection solution: INTRAVENOUS | @ 17:00:00

## 2018-04-28 MED ADMIN — LORazepam 2 mg/mL injection solution: INTRAVENOUS | @ 22:00:00

## 2018-04-28 MED ADMIN — fentaNYL (PF) 50 mcg/mL injection solution: INTRAVENOUS | @ 17:00:00

## 2018-04-28 MED ADMIN — cephalexin 500 mg capsule: @ 17:00:00

## 2018-04-28 MED ADMIN — lactated Ringers intravenous solution: INTRAVENOUS | @ 18:00:00 | NDC 00338011704

## 2018-04-28 MED ADMIN — ipratropium 0.5 mg-albuteroL 3 mg (2.5 mg base)/3 mL nebulization soln: @ 19:00:00

## 2018-04-28 MED ADMIN — lactated Ringers intravenous solution: @ 17:00:00 | NDC 00338011704

## 2018-04-28 MED ADMIN — ketorolac 30 mg/mL (1 mL) injection solution: @ 17:00:00

## 2018-04-28 MED ADMIN — diphenhydrAMINE 25 mg capsule: INTRAVENOUS | @ 22:00:00

## 2018-04-28 MED ADMIN — simethicone 80 mg chewable tablet: SUBCUTANEOUS | @ 20:00:00

## 2018-04-28 MED ADMIN — levalbuteroL 0.63 mg/3 mL solution for nebulization: @ 18:00:00

## 2018-04-28 NOTE — Consults (Signed)
Brief consult note:  I was contacted by the primary team due to the concern of cardiac tamponade.  Patient has history of active heavy drinking and was found on the floor. On my arrival patient is intubated and has arterial line in place.  No pulsus paradoxus was noted on arterial line map /BP.  Bedside ultrasound reveals a small pericardial effusion with hyperdynamic RV and RA, no diastolic collapse was noted in both chambers, parasternal long axis view showed  trivial anterior pericardial fluid.  LV function is depressed.  As per H&P "58 y.o. male with PMH of atrial fibrillation, COPD, EtOH abuse who was transferred from outside facility. Per chart review, family of patient had not heard from him and called the police who found him in his home lying face down and covered in urine/feces. Patient was intubated at outside facility ED. Pan CT scan revealed a comminuted right humeral neck fracture, pulmonary artery enlargement, large left inguinal hernia. CT head showed bilateral scalp hematomas. CT c-spine negative. XR right hand and forearm were negative for fractures.     At outside facility, CPK 526, lactic acid 5.5, urine drug screen negative, UA +, blood cultures positive for gram + cocci in clusters 1/4. Patient was started on vancomycin, zosyn, ceftriaxone. Patient was transferred to [facility] for further management"     Impression:  Bedside ultrasound findings are not suggestive of cardiac tamponade physiology.  Please contact Cardiology team again if patient's hemodynamics /clinical status changes along with persistent suspicious of cardiac tamponade physiology  Please get an official TTE in the morning  Lottie Mussel, MD   Fellow, Cardiovascular Disease  [facility]  Pager# [phone]  04/29/2018  00:01      The findings were discussed, bedside echo  Images reviewed, no evidence of temponade. Please order official TTE in am.  Leonidas Romberg, MD

## 2018-04-28 NOTE — Care Plan (Signed)
Patient not following commands at this time but in no apparent respiratory distress.  Current vent settings are APV/SIMV -   FIO2 (%) 50    Peep(cm/H2O) 8    Rate(bpm) 14    Tidal Volume(mls) 500    Pressure Support(cm/H2O) 12    May wean to PSV.  Will continue to follow and monitor patient's respiratory status weaning as tolerated.

## 2018-04-28 NOTE — Nurses Notes (Signed)
Levophed gtt stopped upon pt's arrival. Blood pressure stable at this time.

## 2018-04-28 NOTE — Procedures (Signed)
Date:  04/28/18   Time 1715  Procedure:  Reduction of left inguinal hernia  Diagnosis:  Incarcerated left inguinal hernia     Description of Procedure:  Sedation was administered (see separate note).  Gentle pressure was applied towards the incarcerated hernia.  It was ultimately reduced back into the abdominal cavity.        Estevan Ryder, MD  04/28/2018, 17:39    I saw and examined the patient.  I reviewed the resident's note.  I agree with the findings and plan of care as documented in the resident's note.  Any exceptions/additions are edited/noted.    Mallie Mussel, MD

## 2018-04-28 NOTE — Procedures (Signed)
[facility] Line Insertion    Date:  04/28/2018  Time:  4:30 PM    Preoperative Diagnosis:  Shock  Postoperative Diagnosis:  Unchanged    Procedure:  Central line insertion in the right femoral vein.    Performed by:  Leia Alf    Indication:  venous access    Description of Procedure:  Informed consent was obtained.  A surgical timeout was performed to correctly identify the correct patient, procedure, and site. The patient was prepped with chlorhexidine and draped in normal sterile fashion. The skin was anesthetized with lidocaine. The introducer needle was then used to access the right femoral vein.  Dark, nonpulsatile blood was returned. A wire was then passed through the introducer needle.  Using seldinger technique, the dilator was passed followed by the triple  lumen catheter. All lumens had excellent return of blood and were easily flushed. The catheter was secured to the skin with simple interrupted silk sutures. A sterile dressing was applied. Ultrasound guidance was not needed.  The patient tolerated the procedure without any immediate complications.    Michaelle Birks, MD 04/28/2018, 17:46    Trauma/Surgical Critical Care Staff/ Acute Care Surgery  I was present and supervised/observed the entire procedure.  Electronically Signed by:    Robyn Haber DeLa'O  Assistant Professor of Surgery  Trauma, Surgical Critical Care, and Acute Care Surgery  [facility]  04/28/2018 17:50

## 2018-04-28 NOTE — H&P (Signed)
[facility]                                 SICU ADMISSION        HISTORY and PHYSICAL    Antonio Gillespie, Antonio Gillespie.  Date of Admission:  04/28/2018  Date of Birth:  1960/02/18  Date of Service: 04/28/2018    Primary Attending:  Kasandra Gillespie, Antonio Gillespie  Primary Service:  Trauma Blue    Information Obtained from: health care provider and history reviewed via medical record  Chief Complaint: Found down    PCP: No primary care provider on file.  Consult Requested By:  Antonio Gillespie    HPI:      Antonio Gillespie is a 58 y.o. male with PMH of atrial fibrillation, COPD, EtOH abuse who was transferred from outside facility. Per chart review, family of patient had not heard from him and called the police who found him in his home lying face down and covered in urine/feces.     ROS:  MUST comment on all "Abnormal" findings     ROS Review of systems was not obtained due to intubated .    PAST MEDICAL/ FAMILY/ SOCIAL HISTORY:     No past medical history on file.  COPD  EtOH abuse    Allergies not on file  Medications Prior to Admission     None         No current outpatient medications on file.     Unknown past surgical history      Family Medical History:     None          Social History     Tobacco Use   . Smoking status: Not on file   Substance Use Topics   . Alcohol use: Not on file   . Drug use: Not on file     Patient unable to report past medical history, medications, allergies, past surgical history, family history or social history secondary to altered mental status/intubation. History obtained via medical record provided from outside hospital.    Physical Exam     Filed Vitals:    04/28/18 1551 04/28/18 1554 04/28/18 1615 04/28/18 1630   BP:  (!) 137/117 (!) 119/94 (!) 124/93   Pulse:  (!) 128 (!) 122 (!) 123   Resp:  Temp:       SpO2: 100% 95% 100% 96%     General:  Acutely ill traumatic patient  Eyes:  Conjunctiva clear, Pupils not reactive  HENT:  NCAT, Mucous membranes moist. Intubated. Fascial  swelling. Scalp laceration  Lungs:  Coarse breath sounds bilaterally, Normal respiratory effort  Cardiovascular:  Regular rhythm tachycardic.   Abdomen:  S, NT, ND  GU: Swollen scrotum with purulent fluid from the penis  Extremities:  Right elbow deformity  Skin:  Skin warm and dry.  Neurologic:  Intubated minimal response to stimulus.     Labs     Lab Results Today:    Results for orders placed or performed during the hospital encounter of 04/28/18 (from the past 24 hour(s))   BUN   Result Value Ref Range    BUN 3 (L) 8 - 25 mg/dL   CREATININE   Result Value Ref Range    CREATININE 0.72 0.62 - 1.27 mg/dL    ESTIMATED GFR >16 >10 mL/min/1.71m^2   GLUCOSE, NON FASTING   Result Value Ref Range  GLUCOSE 120 65 - 139 mg/dL   PT/INR   Result Value Ref Range    PROTHROMBIN TIME 16.1 (H) 9.1 - 13.9 seconds    INR 1.39 (H) 0.80 - 1.20   PTT (PARTIAL THROMBOPLASTIN TIME)   Result Value Ref Range    APTT 27.8 24.2 - 37.5 seconds   CBC WITH DIFF   Result Value Ref Range    WBC 9.0 3.7 - 11.0 x10^3/uL    RBC 3.58 (L) 4.50 - 6.10 x10^6/uL    HGB 9.4 (L) 13.4 - 17.5 g/dL    HCT 08.632.2 (L) 57.838.9 - 52.0 %    MCV 89.9 78.0 - 100.0 fL    MCH 26.3 26.0 - 32.0 pg    MCHC 29.2 (L) 31.0 - 35.5 g/dL    RDW-CV 46.923.1 (H) 62.911.5 - 15.5 %    PLATELETS 185 150 - 400 x10^3/uL    MPV 10.8 8.7 - 12.5 fL    NEUTROPHIL % 79 %    LYMPHOCYTE % 11 %    MONOCYTE % 9 %    EOSINOPHIL % 1 %    BASOPHIL % 0 %    NEUTROPHIL # 7.12 1.50 - 7.70 x10^3/uL    LYMPHOCYTE # 0.98 (L) 1.00 - 4.80 x10^3/uL    MONOCYTE # 0.78 0.20 - 1.10 x10^3/uL    EOSINOPHIL # <0.10 <=0.50 x10^3/uL    BASOPHIL # <0.10 <=0.20 x10^3/uL    IMMATURE GRANULOCYTE % 0 0 - 1 %    IMMATURE GRANULOCYTE # <0.10 <0.10 x10^3/uL   CREATINE KINASE (CK), TOTAL, SERUM   Result Value Ref Range    CREATINE KINASE 402 (H) 45 - 225 U/L   HEPATIC FUNCTION PANEL   Result Value Ref Range    ALBUMIN 1.8 (L) 3.5 - 5.0 g/dL    ALKALINE PHOSPHATASE 238 (H) 45 - 115 U/L    ALT (SGPT) 33 <55 U/L    AST (SGOT) 55 (H)  8 - 48 U/L    BILIRUBIN TOTAL 1.6 (H) 0.3 - 1.3 mg/dL    BILIRUBIN DIRECT 1.2 (H) <0.3 mg/dL    PROTEIN TOTAL 5.7 (L) 6.4 - 8.3 g/dL   MAGNESIUM   Result Value Ref Range    MAGNESIUM 1.7 1.6 - 2.6 mg/dL   PHOSPHORUS   Result Value Ref Range    PHOSPHORUS 2.5 2.4 - 4.7 mg/dL   MORPHOLOGY   Result Value Ref Range    RBC MORPHOLOGY Normal RBC and PLT Morphology    POC BLOOD GLUCOSE (RESULTS)   Result Value Ref Range    GLUCOSE, POC 124 (H) 70 - 105 mg/dl   LACTIC ACID LEVEL   Result Value Ref Range    LACTIC ACID 3.5 (H) 0.5 - 2.2 mmol/L       Recent Imaging      outside facility imaging per A/P      Assessment/Plan     Active Hospital Problems    Diagnosis   . Scalp hematoma     Antonio Gillespie is a 58 y.o. male with PMH of atrial fibrillation, COPD, EtOH abuse who was reported to be found down with humerus fracture and scalp laceration.    NEURO:  GCS: E4=Spontaneous (Opens Eyes on Own) M6=Normal (Follows Simple Commands) V1=None (Makes No Noise or Intubated)   Patient Fluctuating between following commands and decerebrate. Noted to have ketamine sedation on route.  Imaging:     Outside facility imaging     CT-C-spine:   "Impression: Large bilateral  scalp hematomas, left greater than right but no evidence of acute intracranial abnormality. No evidence of acute fracture or traumatic subluxation of cervical spine."     CT max/facial w/o contrast: Chronic bilateral nasal bone fractures although is difficult to exclude superimposed acute on chronic injury as there is soft tissue swelling in this region.      CT Brain: No acute intracranial process.    Sedation/analgesia:     CIWA protocol  Thiamine     CARDIOVASCULAR:  Systolic (24hrs), Avg:127 , Min:119 , Max:137     Diastolic (24hrs), Avg:95, Min:76, Max:117         Troponins: No results found for: CKMB   Meds:   Pressors: Levophed on admission. Wean to MAP >65  No none cardiac history  Evidence for pHTN on CT    PULMONARY:  Airway Ventilator Settings   EndoTracheal Tube  Oral 7.5 23 cm Lip (Active)   Airway Secure Device 04/28/2018  3:51 PM   Position Change Yes 04/28/2018  3:51 PM   Change Reason Routine 04/28/2018  3:51 PM   Bilateral General Breath Sounds Coarse 04/28/2018  3:51 PM    Conventional settings:  Mode: APV (SIMV)  Set VT: 500 mL  Set Rate: 14 Breaths Per Minute  Set PEEP: 8 cmH2O  Pressure Support: 12 cmH2O  FiO2: 50 %  I:E Ratio: 1:2.9     SpO2  Avg: 97 %  Min: 94 %  Max: 100 %  Blood Gas:  No results found for this encounter  Nebs: None  Imaging: CXR ordered  Plan: Continue mechanical ventilation    GI:  MNT PROTOCOL FOR DIETITIAN      Recent Labs     04/28/18  1613   ALBUMIN 1.8*     Last BM:    Proph: Pepcid  Bowel regimen: Colace, Senna  Surgery to reduce inguinal hernia bedside    RENAL/GU:  Recent Labs     04/28/18  1613   BUN 3*   CREATININE 0.72   MAGNESIUM 1.7   PHOSPHORUS 2.5       I/O last 24 hours:  No intake or output data in the 24 hours ending 04/28/18 1709  I/O current shift:  No intake/output data recorded.  Foley in critically ill pt for strict I/O's  IV fluids: Plasmalyte @ 100 ml/hr  CK check and trend      HEME:  Recent Labs     04/28/18  1613   HGB 9.4*   HCT 32.2*   PLTCNT 185   APTT 27.8   INR 1.39*     Transfusions: None  Proph: SCD's   DVT prophylaxis per trauma    ID:  Temp (24hrs) Max:36.8 C (98.2 F)    Recent Labs     04/28/18  1613   WBC 9.0     UA positive at outside  Repeat UA    ENDO:  No results for input(s): GLUCOSEPOC in the last 24 hours.  SSI    MSK:  No skin breakdown   R proximal humerus fracture:  "- No orthopaedic intervention  - Sling if necessary   - Will discuss with staff"      OTHER:  Lines: Will remove outside facility lines. Place central line and arterial line   Activity: Bedrest, HOB 30deg  PT/OT:  ordered  MNT:  ordered    PLAN:  Continue neuro-checks  Maintain MAP>65  NPO  CK and follow up urine output  Hernia reduction per  surgery  R humerus fracture no orthopaedic intervention at this time.      Hollace Hayward, MD  04/28/2018, 17:09     Trauma/Surgical Critical Care/Acute Care Surgery Staff    I saw and examined the patient. I reviewed the residents/PA note. I agree with the findings and plan of care as documented in the resident/PA's note. Any exceptions/additions are edited/noted.  Antonio Gillespie is a 58 y.o. old male   AdmissionWeight: 87.5 kg (192 lb 14.4 oz)  CurrentWeight: 87.5 kg (192 lb 14.4 oz)       Neuro:   GCS variable (8T to 10T)  Continue multimodal pain medication  Sedation: ketamine en route. Will allow to wake up and get a better assessment  Alcohol history- Thiamine/folate    Resp:   Respiratory Rate: 13  SpO2: 96 %      Recent Labs     04/28/18  1717   FI02 50   PH 7.36   PCO2 46.0*   PO2 159.0*   BICARBONATE 25.1   BASEEXCESS 0.1     Ac resp failure- wean mech ventilation  Continue aggressive pulmonary hygiene/IS   ? COPD History. Ensure has nebs available    CV:  SBP over last 24:  Systolic (24hrs), Avg:127 , Min:119 , Max:137   Hypotension requiring levophed at outside hospital. Weaned off and now Hemodynamically  Acceptable after volume given. Cont to monitor  CT suggestive of pulmonary hypertension    GI:       Diet:NPO  Bowel Reg-start  GI PPX: start pepcid  Inguinal hernia- to attempt reduction at bedside with trauma      Recent Labs     04/28/18  1613   ALBUMIN 1.8*       ENDO:For hyperglycemia management: ISS:    Renal/FEN:   No intake or output data in the 24 hours ending 04/28/18 1747        Recent Labs     04/28/18  1613 04/28/18  1711 04/28/18  1717   BICARBONATE  --   --  25.1   BUN 3*  --   --    CREATININE 0.72  --   --    GLUCOSE  --  Negative  --    MAGNESIUM 1.7  --   --    PHOSPHORUS 2.5  --   --      Uo: Monitor Uo,  IVFS: plasmalyte @ 100 cc/h   Lytes-BMP pending  Foley-yes. critical I&O- change foley from outside hospital  Check CK as reported to be found down for unknown amount of time    Heme:  Anemia-asx Ensure all blood draws are pedi tubes to min volume  taken.  Temperature: 36.8 C (98.2 F)      Recent Labs     04/28/18  1613   WBC 9.0   HGB 9.4*   HCT 32.2*   PLTCNT 185   INR 1.39*     ID: Temp (12hrs), Avg:36.8 C (98.2 F), Min:36.8 C (98.2 F), Max:36.8 C (98.2 F)  Afebrile  +UA at outside hospital. Will repeat here and treat if positive  Abx:   none    Skin/Musculoskeletal:   Extremities: R proximal humeral fx- ortho consult. Reccs pending  Nasal fx- ac vs chronic. Consults per trauma  Wounds: scattered abrasions of varying age  continue local wound care  DVT PPX: SCDs, Lovenox/SQ Heparin  Therapies: PT/OT  Activity OOBTC BID  Lines: TLC and Aline from outside hospital- will  change out   Disposition: cont ICU  Code Status: full    Family in room at time of visit: no, updated no    Critical Care Attestation  Patient with altered mental status requiring frequent neurologic monitoring and data review. Acute hypoxic/hyercarbic Respiratory Failure requiring frequent monitoring and assessment including including aggressive pulmonary hygiene. Weaned of vasopressors. Cont to monitor hemodynamics closely.   This patient suffers from failure or dysfunction of Neurologic/Sensory, Respiratory, Cardiovascular, nfectious/Hematologic and Musculoskeletalsystem(s).     I was present at the bedside of this critically ill patient exclusive of procedures.The care of this patient was in regard to managing (a) conditions(s) that has a high probability of sudden, clinically significant, or life-threatening deterioration and required a high degree of Attending Physician attention and direct involvement to intervene urgently.   Data review and care planning was performed in direct proximity of the patient, examination was obviously performed in direct contact with the patient. All of this time was exclusive of procedure which will be documented elsewhere in the chart.    My critical care time is independent and unique to other providers (no other providers saw patient  for purposes of SICU evaluation)  My critical care time involved full attention to the patients'condition and included:    Review of nursing notes and/or old charts  Review of medications, allergies, and vital signs  Documentation time  Consultant collaboration on findings and treatment options  Care, transfer of care, and discharge plans  Ordering, interpreting, and reviewing diagnostic studies/tab tests  Obtaining necessary history from family, EMS, nursing home staff and/or treating physicians    My critical care time did not include time spent teaching resident physician(s) or other services of resident physicians, or performing other reported procedures.    Total Critical Care Time: 45 minutes  Electronically Signed by:    Warren Lacy MD, MS  Assistant Professor of Surgery  Trauma, Surgical Critical Care, and Acute Care Surgery  [facility]  04/28/2018 17:47

## 2018-04-28 NOTE — Nurses Notes (Signed)
Pt urine output decreased. Pt BP steadily decreasing since start of shift. SICU [facility] notified and at bedside. Cards consulted and at bedside. Orders received for 25g of Albumin.

## 2018-04-28 NOTE — Procedures (Signed)
[facility]  Arterial Line Insertion    Date:  04/28/2018  Time:  4:40    Preoperative Diagnosis:  Shock  Postoperative Diagnosis:  Unchanged    Procedure: Arterial line insertion in the right femoral artery.    Resident:  Hollace Hayward MD, Michaelle Birks, MD      Indication:  Arterial cannulation necessary for invasive BP monitoring    Description of Procedure:  A surgical timeout was performed to correctly identify the correct patient, procedure, and site. The patient was prepped with chlorhexidine and draped in normal sterile fashion. The introducer needle was then used to access the right femoral artery.  Red, pulsatile blood was returned. A wire was then passed through the introducer needle.  Using seldinger technique, the arterial catheter was easily advanced into the arterial lumen.  The catheter was secured to the skin with simple interrupted silk sutures. A sterile dressing was applied. Ultrasound guidance was not needed.  The patient tolerated the procedure without any immediate complications.    Dr. Eben Burow was present throughout the entire procedure    Michaelle Birks, MD 04/28/2018, 17:48    Trauma/Surgical Critical Care Staff/ Acute Care Surgery  I was present and supervised/observed the entire procedure.  Electronically Signed by:    Robyn Haber DeLa'O  Assistant Professor of Surgery  Trauma, Surgical Critical Care, and Acute Care Surgery  [facility]  04/28/2018 18:03

## 2018-04-28 NOTE — H&P (Signed)
[facility]  Trauma History & Physical      Antonio Gillespie, Antonio Gillespie., 58 y.o. male  Date of Birth:  June 07, 1960  Encounter Start Date:  04/28/2018  Inpatient Admission Date: 04/28/2018     04/28/2018    Attending Physician: Odis Luster, MD    HPI:     Trauma Level Alert: Direct admit  Arriving from: Outside Facility [facility]    Pre-Hospital Report:  Patient Condition: Stable  Glasgow Coma Scale: Eye opening: 1 no response, Verbal resonse:  T Intubated, Best motor response:  1 no response  Intubated: Yes  At outside facility  Fluids Received in Route: Antibiotics vancomycin, zosyn, ceftriaxone  Loss of Consciousness: Yes  unknown    Mechanism of Injury:   Found Down  By  police    Arrival to [facility]:  Information Obtained from: patient and history reviewed via medical record  Chief Complaint: Unable to Obtain due to intubated  Additional HPI Details:    Antonio Gillespie is a 58 y.o. male with PMH of atrial fibrillation, COPD, EtOH abuse who was transferred from outside facility. Per chart review, family of patient had not heard from him and called the police who found him in his home lying face down and covered in urine/feces. Patient was intubated at outside facility ED. Pan CT scan revealed a comminuted right humeral neck fracture, pulmonary artery enlargement, large left inguinal hernia. CT head showed bilateral scalp hematomas. CT c-spine negative. XR right hand and forearm were negative for fractures.     At outside facility, CPK 526, lactic acid 5.5, urine drug screen negative, UA +, blood cultures positive for gram + cocci in clusters 1/4. Patient was started on vancomycin, zosyn, ceftriaxone. Patient was transferred to [facility] for further management.     ROS:  MUST comment on all "Abnormal" findings   ROS Review of systems was not obtained due to intubated .      PAST MEDICAL/ FAMILY/ SOCIAL HISTORY:       Atrial fibrillation  COPD  EtOH abuse    Tetanus: Unknown  Medications Prior to  Admission     None         Allergies  - unable to obtain - intubated      Social history  - alcohol abuse per chart    Code Status:  Code Status Information     Code Status    Not on file          Family History:   Unable to obtain - intubated    PHYSICAL EXAMINATION: MUST comment on all "Abnormal" findings    INITIAL VITALS: Temperature: 36.8 C (98.2 F)  BP (Non-Invasive): 128/76  MAP (Non-Invasive): 92 mmHG  Heart Rate: (!) 126(Simultaneous filing. User may not have seen previous data.)  Respiratory Rate: 18(Simultaneous filing. User may not have seen previous data.)  SpO2: 94 %(Simultaneous filing. User may not have seen previous data.)  EXAM: Temperature: 36.9 C (98.4 F)  Heart Rate: (!) 109  BP (Non-Invasive): (!) 114/92  Respiratory Rate: 14  SpO2: 98 %  GEN:   intubated, chronically ill  HEENT:   Pupils 6mm bilaterally, fixed, large posterior scalp hematoma  NECK:   Trachea midline  PULM:   Diminished breath sounds bilaterally  CARDIAC:   Regular rate and rhythm  CHEST::No abrasions or contusions  ABD:   Abdomen soft, and nondistended  PELVIS: No evidence of injury  GU/RECTAL: large scrotal edema  BACK:  No evidence  of injury and no step-off  MS: ulcers noted on bilateral knees and feet  NEURO:  Intubated, following commands  GLASGOW COMA SCALE: Eye opening: 4 spontaneous, Verbal resonse:  T Intubated, Best motor response:  6 obeys commands  VASCULAR:  All pulses palpable and equal bilaterally  INTEGUMENTARY:  Pink, warm, and dry and anasarca    DIAGNOSTIC STUDIES: Comment on "Positives"   Labs:  Lab Results Today:    Results for orders placed or performed during the hospital encounter of 04/28/18 (from the past 24 hour(s))   BUN   Result Value Ref Range    BUN 3 (L) 8 - 25 mg/dL   CREATININE   Result Value Ref Range    CREATININE 0.72 0.62 - 1.27 mg/dL    ESTIMATED GFR >35 >70 mL/min/1.63m^2   GLUCOSE, NON FASTING   Result Value Ref Range    GLUCOSE 120 65 - 139 mg/dL   PT/INR   Result Value Ref Range     PROTHROMBIN TIME 16.1 (H) 9.1 - 13.9 seconds    INR 1.39 (H) 0.80 - 1.20   PTT (PARTIAL THROMBOPLASTIN TIME)   Result Value Ref Range    APTT 27.8 24.2 - 37.5 seconds   CBC WITH DIFF   Result Value Ref Range    WBC 9.0 3.7 - 11.0 x10^3/uL    RBC 3.58 (L) 4.50 - 6.10 x10^6/uL    HGB 9.4 (L) 13.4 - 17.5 g/dL    HCT 17.7 (L) 93.9 - 52.0 %    MCV 89.9 78.0 - 100.0 fL    MCH 26.3 26.0 - 32.0 pg    MCHC 29.2 (L) 31.0 - 35.5 g/dL    RDW-CV 03.0 (H) 09.2 - 15.5 %    PLATELETS 185 150 - 400 x10^3/uL    MPV 10.8 8.7 - 12.5 fL    NEUTROPHIL % 79 %    LYMPHOCYTE % 11 %    MONOCYTE % 9 %    EOSINOPHIL % 1 %    BASOPHIL % 0 %    NEUTROPHIL # 7.12 1.50 - 7.70 x10^3/uL    LYMPHOCYTE # 0.98 (L) 1.00 - 4.80 x10^3/uL    MONOCYTE # 0.78 0.20 - 1.10 x10^3/uL    EOSINOPHIL # <0.10 <=0.50 x10^3/uL    BASOPHIL # <0.10 <=0.20 x10^3/uL    IMMATURE GRANULOCYTE % 0 0 - 1 %    IMMATURE GRANULOCYTE # <0.10 <0.10 x10^3/uL   CREATINE KINASE (CK), TOTAL, SERUM   Result Value Ref Range    CREATINE KINASE 402 (H) 45 - 225 U/L   HEPATIC FUNCTION PANEL   Result Value Ref Range    ALBUMIN 1.8 (L) 3.5 - 5.0 g/dL    ALKALINE PHOSPHATASE 238 (H) 45 - 115 U/L    ALT (SGPT) 33 <55 U/L    AST (SGOT) 55 (H) 8 - 48 U/L    BILIRUBIN TOTAL 1.6 (H) 0.3 - 1.3 mg/dL    BILIRUBIN DIRECT 1.2 (H) <0.3 mg/dL    PROTEIN TOTAL 5.7 (L) 6.4 - 8.3 g/dL   MAGNESIUM   Result Value Ref Range    MAGNESIUM 1.7 1.6 - 2.6 mg/dL   PHOSPHORUS   Result Value Ref Range    PHOSPHORUS 2.5 2.4 - 4.7 mg/dL   MORPHOLOGY   Result Value Ref Range    RBC MORPHOLOGY Normal RBC and PLT Morphology    TROPONIN-I   Result Value Ref Range    TROPONIN I 124 (H) 0 - 30 ng/L  BASIC METABOLIC PANEL   Result Value Ref Range    SODIUM 137 136 - 145 mmol/L    POTASSIUM 3.6 3.5 - 5.1 mmol/L    CHLORIDE 107 96 - 111 mmol/L    CO2 TOTAL 21 (L) 22 - 32 mmol/L    ANION GAP 9 4 - 13 mmol/L    CALCIUM 7.3 (L) 8.5 - 10.2 mg/dL    GLUCOSE 161 65 - 096 mg/dL    BUN 3 (L) 8 - 25 mg/dL    CREATININE 0.45 4.09 - 1.27  mg/dL    BUN/CREA RATIO 4 (L) 6 - 22    ESTIMATED GFR >60 >60 mL/min/1.16m^2   POC BLOOD GLUCOSE (RESULTS)   Result Value Ref Range    GLUCOSE, POC 124 (H) 70 - 105 mg/dl   LACTIC ACID LEVEL   Result Value Ref Range    LACTIC ACID 3.5 (H) 0.5 - 2.2 mmol/L   URINALYSIS, MACROSCOPIC   Result Value Ref Range    SPECIFIC GRAVITY 1.044 (H) 1.005 - 1.030    GLUCOSE Negative Negative mg/dL    PROTEIN 811  (A) Negative mg/dL    BILIRUBIN Small (A) Negative mg/dL    UROBILINOGEN 4.0   (A) Negative mg/dL    PH 5.0 5.0 - 8.0    BLOOD Small (A) Negative mg/dL    KETONES Negative Negative mg/dL    NITRITE Negative Negative    LEUKOCYTES Moderate (A) Negative WBCs/uL    APPEARANCE Cloudy (A) Clear    COLOR Orange (A) Normal (Yellow)   URINALYSIS, MICROSCOPIC   Result Value Ref Range    WBCS 143.0 (H) <4.0 /hpf    RBCS 21.0 (H) <6.0 /hpf    BACTERIA Few (A) Occasional or less /hpf    HYALINE CASTS 119.0 (H) <4.0 /lpf    GRANULAR CASTS 69 (H) <=0 /lpf    SQUAMOUS EPITHELIAL CELLS Occasional or less Occasional or less /lpf    MUCOUS Light Light /lpf   ARTERIAL BLOOD GAS   Result Value Ref Range    %FIO2 (ARTERIAL) 50 %    BICARBONATE (ARTERIAL) 25.1 18.0 - 26.0 mmol/L    PCO2 (ARTERIAL) 46.0 (H) 35.0 - 45.0 mm/Hg    PH (ARTERIAL) 7.36 7.35 - 7.45    PO2 (ARTERIAL) 159.0 (H) 72.0 - 100.0 mm/Hg    BASE EXCESS (ARTERIAL) 0.1 0.0 - 1.0 mmol/L    PAO2/FIO2 RATIO 318 <=200   ECG 12-LEAD   Result Value Ref Range    Ventricular rate 111 BPM    Atrial Rate 111 BPM    PR Interval 160 ms    QRS Duration 84 ms    QT Interval 400 ms    QTC Calculation 544 ms    Calculated P Axis 50 degrees    Calculated R Axis 40 degrees    Calculated T Axis -129 degrees   CREATINE KINASE (CK), TOTAL, SERUM   Result Value Ref Range    CREATINE KINASE 368 (H) 45 - 225 U/L   ARTERIAL BLOOD GAS/LACTATE   Result Value Ref Range    %FIO2 (ARTERIAL) 40 %    PH (ARTERIAL) 7.39 7.35 - 7.45    PCO2 (ARTERIAL) 42.0 35.0 - 45.0 mm/Hg    PO2 (ARTERIAL) 161.0 (H) 72.0 -  100.0 mm/Hg    BASE EXCESS (ARTERIAL) 0.3 0.0 - 1.0 mmol/L    BICARBONATE (ARTERIAL) 25.2 18.0 - 26.0 mmol/L    LACTATE 2.0 (H) 0.0 - 1.3 mmol/L    PAO2/FIO2  RATIO 403 <=200   TROPONIN-I   Result Value Ref Range    TROPONIN I 130 (H) 0 - 30 ng/L   ETHANOL, SERUM   Result Value Ref Range    ETHANOL <10 <10 mg/dL    ETHANOL None Detected        Radiology:    OSH Films with reads-  CT Brain  Positive large bilateral scalp hematomas  CT Cervical Spine  Negative for trauma  CT Chest, Abdomen & Pelvis  Positive acute comminuted posteriorly angulated and displaced right humeral neck fracture, scattered scarring or atelectasis of both lungs, pulmonary artery enlargement, hepatic steatosis, large bowel containing left inguinal hernia  CT Face Negative for trauma    Patient Management:   Procedures:  Left inguinal hernia reduction    Consults Ordered   No orders of the defined types were placed in this encounter.      Assessment & Plan/Recommendations:      Active Hospital Problems   (*Primary Problem)    Diagnosis   . Scalp hematoma   . Humeral fracture   . UTI (urinary tract infection)   . Left inguinal hernia   . Anasarca   . Fall       Plan:  Admit to TES under the service of  Odis Luster, MD  to ICU  - wean to extubate  - left inguinal hernia reduced  - Ortho consulted  - Trend CK  - blood cultures, urine culture pending  - empiric antibiotics      Donetta Potts, MD     I saw and examined the patient.  I reviewed the resident's note.  I agree with the findings and plan of care as documented in the resident's note.  Any exceptions/additions are edited/noted.    Mallie Mussel, MD

## 2018-04-28 NOTE — Consults (Signed)
[facility]  Department of Orthopaedics  H&P / Consult Note  Date of Service: 04/28/2018      Patient: Antonio Gillespie. Shaut  MRN: [id number]  DOB: [birth year]    Admission Date: 04/28/2018  Ortho Staff: Eulah Pont, MD  Requesting Service/Staff: Trauma    PCP:   No primary care provider on file.    Consult Reason:   R proximal humerus fracture    HPI:   Antonio Gillespie. Nordhoff is a 58 y.o. male transferred to [facility] from [place] after being found down. Per Trauma Team, patient was has a history of drinking and was found down by his neighbor after being down for unknown amount of time. He was taken to [facility] via [facility]. He was hypotensive and started on pressors. CT chest/abdomen/pelvis revealed right proximal humerus fracture for which ortho was consulted upon arrival.    Went to outside facility [facility]). Imaging revealed the above injury.Transferred to [place]. Ortho consulted.    Patient arrived intubated and unable to participate in the exam or to answer questions    PMH:  - Patient intubated and unable to answer questions    PSH:  Patient intubated and unable to answer questions    ALLERGIES:  Patient intubated and unable to answer questions    HOME MEDICATIONS:  Patient intubated and unable to answer questions    SH:   Patient intubated and unable to answer questions    FH:  - No family h/o anesthesia problems per patient's knowledge  - No family or personal h/o blood clots or bleeding disorders per patient's knowledge    ROS:   Per HPI, otherwise negative    PHYSICAL EXAM:          Gen: Sedated and intubated  Integument: Warm and dry  Psych: Intubated  HEENT: C-collar in place  Resp: Intubated  Abd: Distended scrotum    Msk: Patient intubated and on sedation: unable to participate in the exam  RUE:  Crepitus noted with passive ROM of R shoulder  2+ U/R pulses    LUE:  No crepitus noted with passiveROM of left shoulder  2+ U/R pulses    RLE:  No crepitus noted with passive ROM of right hip/knee/ankle  2+  DP/PT pulses    LLE:  No crepitus noted with passive ROM of left hip/knee/ankle  2+ DP/PT pulses    IMAGING:   - CT chest/abd/pelvis ([facility]): R proximal humerus fracture    PERTINENT LABS:     - WBC: 9  - H/H: 9.4/32.2  - PLTS: 185  - PT/INR: 16.1/1.39    FRACTURE:  R proximal humerus fracture    ASSESSMENT:  58 y.o. male transferred from [facility] after being found down. Ortho consulted from R proximal humerus fracture    PROCEDURES :  -  (see procedure notes for details)    PLAN/RECOMMENDATIONS:   - Please place "Ortho Consult" in Epic.  - No orthopaedic intervention  - Sling if necessary   - Will discuss with staff    Emilia Beck, MD 04/28/2018, 17:22   PGY-1 Emergency Medicine  [facility]      ADDENDUM:    Agree with above. Patient presents very sick and incidentally found to have R proximal humerus fx. No other areas of crepitus. Does have swelling of the R hand and forearm. This was XR'd at outside facility and negative. Will repeat films here. Will need tertiary when able to participate. Expect non-operative management for proximal  humerus fx.    --  Elwin Mocha, MD  Resident, PGY-2  Department of Orthopaedics  Pager [phone]  04/28/2018, 19:00      Late entry for 04/28/18. I saw and examined the patient.  I reviewed the resident's note.  I agree with the findings and plan of care as documented in the resident's note.  Any exceptions/additions are edited/noted.    Macky Lower, MD

## 2018-04-28 NOTE — Nurses Notes (Signed)
Pt's right IJ removed at bedside by SICU service.

## 2018-04-28 NOTE — Care Plan (Deleted)
Pt is currently on a ATC at 40%.  Cuff is to stay up until tomorrow per ENT.  She is resting comfortably with no apparent respiratory issues at this time. Will continue to follow and monitor.

## 2018-04-28 NOTE — Nurses Notes (Addendum)
Patient arrived to SICU bed 4 from [facility] via [place]. Primary RNs at bedside [facility]). SICU resident, [place], at bedside. Trauma notified of patient arrival as well. Bilateral scalp hematomas noted on arrival. Right IJ central triple lumen present on arrival also. Levophed infusing through proximal port at 0.36mcg/kg (83kg)/min. Foley catheter and OGT in place. Awaiting admission orders and labs.

## 2018-04-28 NOTE — Procedures (Signed)
Femoral Arterial Line Placement    Procedure Date:  04/28/2018 Time:  1700  Procedure: Arterial Line Placement  Diagnosis:  Trauma- scalp hematoma  Indication: invasive BP monitoring and frequent arterial sampling  Description: Because of an urgent/emergent situation, informed consent was not obtained. Femoral pulse was palpated. The skin was prepped with chlorhexidine and  1% lidocaine analgesia was used.  A femoral arterial catheter was advanced into the right femoral artery using the Seldinger technique. The guidewire was removed and the catheter was left in place. Pulsatile blood was noted and then the line was connected to the transducer showing arterial wave forms.  The line was sutured in place and dressed. Patient tolerated procedure without complications.     Coralee Rud  4th yr Medical Student  [employer]    Trauma/Surgical Critical Care Staff/ Acute Care Surgery  I was present and supervised/observed the entire procedure.  Electronically Signed by:    Robyn Haber DeLa'O  Assistant Professor of Surgery  Trauma, Surgical Critical Care, and Acute Care Surgery  [facility]  04/28/2018 17:45

## 2018-04-28 NOTE — Procedures (Signed)
[facility]  Adult Conscious Sedation  Procedure Note    Procedure Date:  04/28/2018 Time:  1715 to 1730  Procedure: Conscious IV sedation  Diagnosis:  Incarcerated left inguinal hernia  Indication:  Incarcerated left inguinal hernia reduction   Supervising Provider: Estevan Ryder, MD    Pre-Sedation Assessment:  NPO: yes  Prior problems with anesthesia/sedation: No  Is patient intubated: Yes  Physical Exam completed: Yes  Medical History reviewed prior to sedation: Yes  ASA Class: III  Mallampati Score: not assessed    Sedation:  Monitoring Equipment:  Cardiac Monitoring: Yes.  Rhythm: Normal Sinus Rhythm  Pulse Oximetry: Yes  IV access: peripheral IV, central Line  NIBP:  Yes  Arterial line: Yes  Sedatives: fentanyl and versed  Analgesia: fentanyl   Paralytics: Rocuronium    Post-Sedation Monitoring:  Respiratory Function:  Consistent with pre anesthetic level  Cardiovascular Function:  Consistent with pre anesthetic level  Mental Status:  Return to pre anesthetic baseline level  Pain:  Sufficiently controlled with medication  Nausea and Vomiting:  Not applicable    The patient was continuously monitored throughout the procedure and in recovery. I was in attendance and supervised the sedation (during the start and stop times listed above) and remained immediately available until the patient returned to pre-procedure baseline.    See Sedation Documentation Timeline Report for Additional Information in Chart Review or Summary    Total Duration of Conscious Sedation and Monitoring: 15 minutes    Estevan Ryder, MD  04/28/2018, 17:36    Trauma/Surgical Critical Care Staff/ Acute Care Surgery  I was present and supervised/observed the entire procedure.  Electronically Signed by:    Robyn Haber DeLa'O  Assistant Professor of Surgery  Trauma, Surgical Critical Care, and Acute Care Surgery  [facility]  04/28/2018 17:46

## 2018-04-28 NOTE — Nurses Notes (Signed)
Both of pt's eye's are fixed (NPi 0) and both are size 98mm per pupillometer device. SICU service Dr. Eben Burow notified.

## 2018-04-28 NOTE — Nurses Notes (Signed)
Pt has made 63mL urine since admission, remains tachycardic. SICU team notified. At bedside to perform ultrasound of IVC to assess volume status. Awaiting orders at this time.

## 2018-04-28 NOTE — Pharmacy Vancomycin Dosing (Signed)
[facility] / Department of Pharmaceutical Services  Therapeutic Drug Monitoring: Vancomycin  04/28/2018      Patient name: Antonio Gillespie, Antonio Gillespie.  Date of Birth:  02-11-1961    Actual Weight:  Weight: 87.5 kg (192 lb 14.4 oz) (04/28/18 1545)     BMI:  BMI (Calculated): 29.3 (04/28/18 1545)    Date RPh Current regimen (including mg/kg) Indication Target Levels (mcg/mL) SCr (mg/dL) CrCl* (mL/min) Measured level (mcg/mL) Plan (including when levels are due) Comments   3/13 [facility] Vancomycin 1000 mg q12h x2 doses (last dose 3/13 at 1354) Bloodstream infection 10-15 0.72 approx 100  Vancomycin 1250 mg (15 mg/kg) q12h. Plan for level if continued >72h +cefepime                                                                             *Creatinine clearance is estimated by using the Cockcroft-Gault equation for adult patients and the Brendolyn Patty for pediatric patients.    The decision to discontinue vancomycin therapy will be determined by the primary service.  Please contact the pharmacist with any questions regarding this patient's medication regimen.

## 2018-04-28 NOTE — Care Management Notes (Addendum)
QSOFA SCREENING    PATIENT INFORMATION    PCP: No primary care provider on file.    RESERVATION INFORMATION  Received call from:   Phone:   Referring Provider: Tandy Gaw.         Referring Provider Phone: [phone]    Transfer Source: [facility]     Transfer Emergent:  Urgent  Transfer Reason: Level of Care not available at Current Facility  Transfer Comments:   Admitted on: 04/27/2018   Pt Class and Level of Care: Inpatient Intensive Care  Diagnosis: bilateral scalp hematoma*  UTI      CLINICAL INFORMATION  Intubated: Intubated, yes  Medications, IVF, Drips: Propofol;Fentanyl  Zosyn  Ceftriaxone and Vanc, NS      qSOFA Score >= 2 = Positive              Value               Score    Respiratory Rate >= 22 breaths per minute = 1 point 12-14 vented    Systolic Blood Pressure <= 100 mmHg = 1 point 104/75    Altered Mental Status: GCS <15 = 1 point Yes unresponsive due to sedation    Total Score       Serum Lactate Level > = 68mmol/L (36 mg/ dL)= Positive:  Initial call level (if not available put NA) 5.6   Followed up call level 1.6        Positive Screen: yes  Antibiotics:  yes  Fluids/Pressor:  yes   Blood Cultures:  yes  1 of 2 is gram+ cocci in clusters  Serum Lactate:   yes      Admitting Pt Class and Level of Care: Inpatient, Intensive Care,   Accepted By: Kasandra Knudsen, JENNIFER, TRAUMA BLUE SIC    Veneda Melter, RN  Pt found unresponsive by neighbor, length of time down unknown.  Pt is a drinker and goes through w/d when not drinking.  Pt has central line.  Pt intubated for airway protection and sedated for severe pain.    outside facility can not do an MRI with an intbated pt.  CT show no head bleed.    ABG at 6am         Ph 7.38     pco2 42        po2 118       HCO3 24  Excess 0.4  VS 78 hr    BP 104/75      rr 12-14 vented       Sats 99% FIO2 50%    Temp 36.0

## 2018-04-29 ENCOUNTER — Inpatient Hospital Stay (HOSPITAL_COMMUNITY): Payer: Medicaid Other

## 2018-04-29 DIAGNOSIS — I351 Nonrheumatic aortic (valve) insufficiency: Secondary | ICD-10-CM

## 2018-04-29 DIAGNOSIS — R778 Other specified abnormalities of plasma proteins: Secondary | ICD-10-CM | POA: Diagnosis present

## 2018-04-29 DIAGNOSIS — J9600 Acute respiratory failure, unspecified whether with hypoxia or hypercapnia: Secondary | ICD-10-CM | POA: Diagnosis present

## 2018-04-29 DIAGNOSIS — S4291XA Fracture of right shoulder girdle, part unspecified, initial encounter for closed fracture: Secondary | ICD-10-CM

## 2018-04-29 DIAGNOSIS — I517 Cardiomegaly: Secondary | ICD-10-CM

## 2018-04-29 DIAGNOSIS — R7989 Other specified abnormal findings of blood chemistry: Secondary | ICD-10-CM | POA: Diagnosis present

## 2018-04-29 DIAGNOSIS — I313 Pericardial effusion (noninflammatory): Secondary | ICD-10-CM

## 2018-04-29 DIAGNOSIS — R4182 Altered mental status, unspecified: Secondary | ICD-10-CM

## 2018-04-29 DIAGNOSIS — R9082 White matter disease, unspecified: Secondary | ICD-10-CM

## 2018-04-29 DIAGNOSIS — E876 Hypokalemia: Secondary | ICD-10-CM | POA: Diagnosis present

## 2018-04-29 DIAGNOSIS — I959 Hypotension, unspecified: Secondary | ICD-10-CM | POA: Diagnosis present

## 2018-04-29 DIAGNOSIS — J9811 Atelectasis: Secondary | ICD-10-CM

## 2018-04-29 DIAGNOSIS — F1010 Alcohol abuse, uncomplicated: Secondary | ICD-10-CM | POA: Diagnosis present

## 2018-04-29 DIAGNOSIS — S022XXA Fracture of nasal bones, initial encounter for closed fracture: Secondary | ICD-10-CM | POA: Diagnosis present

## 2018-04-29 DIAGNOSIS — G9340 Encephalopathy, unspecified: Secondary | ICD-10-CM

## 2018-04-29 DIAGNOSIS — I361 Nonrheumatic tricuspid (valve) insufficiency: Secondary | ICD-10-CM

## 2018-04-29 DIAGNOSIS — T07XXXA Unspecified multiple injuries, initial encounter: Secondary | ICD-10-CM | POA: Diagnosis present

## 2018-04-29 DIAGNOSIS — I6389 Other cerebral infarction: Secondary | ICD-10-CM

## 2018-04-29 LAB — MANUAL DIFF AND MORPHOLOGY-SYSMEX
BASOPHIL #: <0.1 x10ˆ3/uL (ref <=0.20)
BASOPHIL %: 0 %
EOSINOPHIL #: <0.1 x10ˆ3/uL (ref <=0.50)
EOSINOPHIL %: 0 %
LYMPHOCYTE #: 0.11 10*3/uL — ABNORMAL LOW (ref 1.00–4.80)
LYMPHOCYTE %: 2 %
MONOCYTE #: <0.1 x10ˆ3/uL — ABNORMAL LOW (ref 0.20–1.10)
MONOCYTE %: 1 %
NEUTROPHIL #: 5.14 x10ˆ3/uL (ref 1.50–7.70)
NEUTROPHIL %: 97 %
NRBC FROM MANUAL DIFF: 0 per 100 WBC

## 2018-04-29 LAB — BASIC METABOLIC PANEL
ANION GAP: 6 mmol/L (ref 4–13)
BUN/CREA RATIO: 6 (ref 6–22)
BUN: 4 mg/dL — ABNORMAL LOW (ref 8–25)
CALCIUM: 6.9 mg/dL — ABNORMAL LOW (ref 8.5–10.2)
CHLORIDE: 108 mmol/L (ref 96–111)
CO2 TOTAL: 26 mmol/L (ref 22–32)
CREATININE: 0.68 mg/dL (ref 0.62–1.27)
ESTIMATED GFR: >60 mL/min/1.73mˆ2 (ref >60)
GLUCOSE: 96 mg/dL (ref 65–139)
POTASSIUM: 3.2 mmol/L — ABNORMAL LOW (ref 3.5–5.1)
SODIUM: 140 mmol/L (ref 136–145)

## 2018-04-29 LAB — ARTERIAL BLOOD GAS/LACTATE
%FIO2 (ARTERIAL): 30 %
%FIO2 (ARTERIAL): 30 %
%FIO2 (ARTERIAL): 30 %
%FIO2 (ARTERIAL): 30 %
BASE EXCESS (ARTERIAL): 2.4 mmol/L — ABNORMAL HIGH (ref 0.0–1.0)
BASE EXCESS (ARTERIAL): 2.4 mmol/L — ABNORMAL HIGH (ref 0.0–1.0)
BASE EXCESS (ARTERIAL): 2.7 mmol/L — ABNORMAL HIGH (ref 0.0–1.0)
BASE EXCESS (ARTERIAL): 3.9 mmol/L — ABNORMAL HIGH (ref 0.0–1.0)
BICARBONATE (ARTERIAL): 26.8 mmol/L — ABNORMAL HIGH (ref 18.0–26.0)
BICARBONATE (ARTERIAL): 26.9 mmol/L — ABNORMAL HIGH (ref 18.0–26.0)
BICARBONATE (ARTERIAL): 27.1 mmol/L — ABNORMAL HIGH (ref 18.0–26.0)
BICARBONATE (ARTERIAL): 28 mmol/L — ABNORMAL HIGH (ref 18.0–26.0)
LACTATE: 1.1 mmol/L (ref 0.0–1.3)
LACTATE: 1.2 mmol/L (ref 0.0–1.3)
LACTATE: 1.2 mmol/L (ref 0.0–1.3)
LACTATE: 1.4 mmol/L — ABNORMAL HIGH (ref 0.0–1.3)
PAO2/FIO2 RATIO: 377 (ref <=200)
PAO2/FIO2 RATIO: 390 (ref <=200)
PAO2/FIO2 RATIO: 443 (ref <=200)
PAO2/FIO2 RATIO: 460 (ref <=200)
PCO2 (ARTERIAL): 36 mmHg (ref 35.0–45.0)
PCO2 (ARTERIAL): 38 mmHg (ref 35.0–45.0)
PCO2 (ARTERIAL): 38 mmHg (ref 35.0–45.0)
PCO2 (ARTERIAL): 38 mmHg (ref 35.0–45.0)
PH (ARTERIAL): 7.45 (ref 7.35–7.45)
PH (ARTERIAL): 7.45 (ref 7.35–7.45)
PH (ARTERIAL): 7.47 — ABNORMAL HIGH (ref 7.35–7.45)
PH (ARTERIAL): 7.47 — ABNORMAL HIGH (ref 7.35–7.45)
PO2 (ARTERIAL): 113 mmHg — ABNORMAL HIGH (ref 72.0–100.0)
PO2 (ARTERIAL): 117 mmHg — ABNORMAL HIGH (ref 72.0–100.0)
PO2 (ARTERIAL): 133 mmHg — ABNORMAL HIGH (ref 72.0–100.0)
PO2 (ARTERIAL): 138 mmHg — ABNORMAL HIGH (ref 72.0–100.0)

## 2018-04-29 LAB — POC BLOOD GLUCOSE (RESULTS)
GLUCOSE, POC: 102 mg/dL (ref 70–105)
GLUCOSE, POC: 68 mg/dL — ABNORMAL LOW (ref 70–105)
GLUCOSE, POC: 86 mg/dL (ref 70–105)
GLUCOSE, POC: 89 mg/dL (ref 70–105)
GLUCOSE, POC: 98 mg/dL (ref 70–105)

## 2018-04-29 LAB — CBC WITH DIFF
HCT: 23.3 % — ABNORMAL LOW (ref 38.9–52.0)
HGB: 6.7 g/dL — CL (ref 13.4–17.5)
MCH: 25.6 pg — ABNORMAL LOW (ref 26.0–32.0)
MCHC: 28.8 g/dL — ABNORMAL LOW (ref 31.0–35.5)
MCV: 88.9 fL (ref 78.0–100.0)
PLATELETS: 128 x10ˆ3/uL — ABNORMAL LOW (ref 150–400)
RBC: 2.62 x10ˆ6/uL — ABNORMAL LOW (ref 4.50–6.10)
RDW-CV: 23 % — ABNORMAL HIGH (ref 11.5–15.5)
WBC: 5.3 x10ˆ3/uL (ref 3.7–11.0)

## 2018-04-29 LAB — URINE CULTURE: URINE CULTURE: NO GROWTH

## 2018-04-29 LAB — PROCALCITONIN: PROCALCITONIN: 1.02 ng/mL — ABNORMAL HIGH (ref <0.50)

## 2018-04-29 LAB — DRUG SCREEN, NO CONFIRMATION, URINE
AMPHETAMINES URINE: NEGATIVE
BARBITURATES URINE: NEGATIVE
BENZODIAZEPINES URINE: POSITIVE — AB
BUPRENORPHINE URINE: NEGATIVE
CANNABINOIDS URINE: NEGATIVE
COCAINE METABOLITES URINE: NEGATIVE
CREATININE RANDOM URINE: 54 mg/dL
ECSTASY/MDMA URINE: NEGATIVE
METHADONE URINE: NEGATIVE
OPIATES URINE (LOW CUTOFF): NEGATIVE
OXIDANT-ADULTERATION: NEGATIVE
OXYCODONE URINE: NEGATIVE
PH-ADULTERATION: 5.6 (ref 4.5–9.0)
SPECIFIC GRAVITY-ADULTERATION: 1.017 g/mL (ref 1.005–1.030)

## 2018-04-29 LAB — CREATINE KINASE (CK), TOTAL, SERUM OR PLASMA
CREATINE KINASE: 267 U/L — ABNORMAL HIGH (ref 45–225)
CREATINE KINASE: 249 U/L — ABNORMAL HIGH (ref 45–225)
CREATINE KINASE: 286 U/L — ABNORMAL HIGH (ref 45–225)

## 2018-04-29 LAB — CBC
HCT: 23 % — ABNORMAL LOW (ref 38.9–52.0)
HGB: 6.8 g/dL — CL (ref 13.4–17.5)
MCH: 26.1 pg (ref 26.0–32.0)
MCHC: 29.6 g/dL — ABNORMAL LOW (ref 31.0–35.5)
MCV: 88.1 fL (ref 78.0–100.0)
PLATELETS: 130 x10ˆ3/uL — ABNORMAL LOW (ref 150–400)
RBC: 2.61 x10ˆ6/uL — ABNORMAL LOW (ref 4.50–6.10)
RDW-CV: 22.9 % — ABNORMAL HIGH (ref 11.5–15.5)
WBC: 5.5 x10ˆ3/uL (ref 3.7–11.0)

## 2018-04-29 LAB — TROPONIN-I
TROPONIN I: 153 ng/L — ABNORMAL HIGH (ref 0–30)
TROPONIN I: 149 ng/L — ABNORMAL HIGH (ref 0–30)
TROPONIN I: 145 ng/L — ABNORMAL HIGH (ref 0–30)
TROPONIN I: 82 ng/L — ABNORMAL HIGH (ref 0–30)

## 2018-04-29 LAB — PHOSPHORUS
PHOSPHORUS: 2.1 mg/dL — ABNORMAL LOW (ref 2.4–4.7)
PHOSPHORUS: 2.1 mg/dL — ABNORMAL LOW (ref 2.4–4.7)

## 2018-04-29 LAB — MAGNESIUM: MAGNESIUM: 1.7 mg/dL (ref 1.6–2.6)

## 2018-04-29 LAB — H & H
HCT: 25.6 % — ABNORMAL LOW (ref 38.9–52.0)
HGB: 7.6 g/dL — ABNORMAL LOW (ref 13.4–17.5)

## 2018-04-29 LAB — MRSA SCREEN, PCR, RAPID: MRSA COLONIZATION SCREEN: NEGATIVE

## 2018-04-29 LAB — THYROID STIMULATING HORMONE WITH FREE T4 REFLEX: TSH: 0.421 u[IU]/mL (ref 0.350–5.000)

## 2018-04-29 MED ORDER — INSULIN REGULAR HUMAN 100 UNIT/ML INJECTION SSIP
0.00 [IU] | INJECTION | SUBCUTANEOUS | Status: DC | PRN
Start: 2018-04-29 — End: 2018-05-01

## 2018-04-29 MED ORDER — POTASSIUM BICARBONATE-CITRIC ACID 20 MEQ EFFERVESCENT TABLET
40.0000 meq | EFFERVESCENT_TABLET | ORAL | Status: AC
Start: 2018-04-29 — End: 2018-04-29
  Administered 2018-04-29: 40 meq via GASTROSTOMY
  Filled 2018-04-29: qty 2

## 2018-04-29 MED ORDER — FENTANYL (PF) 50 MCG/ML INJECTION SOLUTION
50.0000 ug | Freq: Once | INTRAMUSCULAR | Status: AC | PRN
Start: 2018-04-29 — End: 2018-04-29

## 2018-04-29 MED ORDER — IOPAMIDOL 370 MG IODINE/ML (76 %) INTRAVENOUS SOLUTION
50.00 mL | INTRAVENOUS | Status: AC
Start: 2018-04-29 — End: 2018-04-29
  Administered 2018-04-29: 11:00:00 50 mL via INTRAVENOUS

## 2018-04-29 MED ORDER — IOPAMIDOL 370 MG IODINE/ML (76 %) INTRAVENOUS SOLUTION
50.00 mL | INTRAVENOUS | Status: AC
Start: 2018-04-29 — End: 2018-04-29
  Administered 2018-04-29: 10:00:00 50 mL via INTRAVENOUS

## 2018-04-29 MED ORDER — IPRATROPIUM 0.5 MG-ALBUTEROL 3 MG (2.5 MG BASE)/3 ML NEBULIZATION SOLN
3.00 mL | INHALATION_SOLUTION | Freq: Four times a day (QID) | RESPIRATORY_TRACT | Status: DC
Start: 2018-04-29 — End: 2018-05-07
  Administered 2018-04-29 – 2018-05-07 (×34): 3 mL via RESPIRATORY_TRACT
  Filled 2018-04-29: qty 3
  Filled 2018-04-29 (×2): qty 6
  Filled 2018-04-29 (×5): qty 3
  Filled 2018-04-29: qty 6
  Filled 2018-04-29 (×2): qty 3
  Filled 2018-04-29: qty 6
  Filled 2018-04-29 (×17): qty 3

## 2018-04-29 MED ORDER — LORAZEPAM 2 MG/ML INJECTION SOLUTION
2.0000 mg | INTRAMUSCULAR | Status: AC
Start: 2018-04-30 — End: 2018-04-30
  Administered 2018-04-30: 2 mg via INTRAVENOUS

## 2018-04-29 MED ORDER — GADOBUTROL 10 MMOL/10 ML (1 MMOL/ML) INTRAVENOUS SOLUTION
9.00 mL | INTRAVENOUS | Status: AC
Start: 2018-04-29 — End: 2018-04-29
  Administered 2018-04-29: 9 mL via INTRAVENOUS

## 2018-04-29 MED ORDER — METRONIDAZOLE 250 MG TABLET
500.00 mg | ORAL_TABLET | Freq: Three times a day (TID) | ORAL | Status: DC
Start: 2018-04-29 — End: 2018-04-29
  Administered 2018-04-29: 500 mg via GASTROSTOMY
  Filled 2018-04-29: qty 2

## 2018-04-29 MED ORDER — MIDAZOLAM 1 MG/ML INJECTION SOLUTION
2.0000 mg | Freq: Once | INTRAMUSCULAR | Status: AC | PRN
Start: 2018-04-29 — End: 2018-04-29
  Filled 2018-04-29: qty 2

## 2018-04-29 MED ORDER — DEXTROSE 50 % IN WATER (D50W) INTRAVENOUS SYRINGE
12.5000 g | INJECTION | Freq: Once | INTRAVENOUS | Status: AC
Start: 2018-04-29 — End: 2018-04-29
  Administered 2018-04-29: 25 mL via INTRAVENOUS
  Filled 2018-04-29: qty 50

## 2018-04-29 MED ORDER — MAGNESIUM SULFATE 4 GRAM/100 ML (4 %) IN WATER INTRAVENOUS PIGGYBACK
4.00 g | INJECTION | INTRAVENOUS | Status: AC
Start: 2018-04-29 — End: 2018-04-29
  Administered 2018-04-29: 4 g via INTRAVENOUS
  Administered 2018-04-29: 0 g via INTRAVENOUS
  Filled 2018-04-29: qty 100

## 2018-04-29 MED ORDER — SODIUM CHLORIDE 0.9 % IV BOLUS
40.0000 mL | INJECTION | Freq: Once | Status: AC | PRN
Start: 2018-04-29 — End: 2018-04-29

## 2018-04-29 MED ORDER — FENTANYL (PF) 50 MCG/ML INJECTION SOLUTION
INTRAMUSCULAR | Status: AC
Start: 2018-04-29 — End: 2018-04-29
  Administered 2018-04-29: 50 ug via INTRAVENOUS
  Filled 2018-04-29: qty 2

## 2018-04-29 MED ORDER — CHLORHEXIDINE GLUCONATE 0.12 % MOUTHWASH
15.00 mL | MOUTHWASH | Freq: Two times a day (BID) | Status: DC
Start: 2018-04-29 — End: 2018-05-17
  Administered 2018-04-29 – 2018-05-16 (×36): 15 mL via ORAL
  Administered 2018-05-17: 0 mL via ORAL
  Filled 2018-04-29 (×35): qty 15

## 2018-04-29 MED ORDER — FAMOTIDINE 20 MG TABLET
20.00 mg | ORAL_TABLET | Freq: Two times a day (BID) | ORAL | Status: DC
Start: 2018-04-29 — End: 2018-05-17
  Administered 2018-04-29 – 2018-05-17 (×36): 20 mg via GASTROSTOMY
  Filled 2018-04-29 (×37): qty 1

## 2018-04-29 MED ORDER — NITROFURANTOIN MONOHYDRATE/MACROCRYSTALS 100 MG CAPSULE
100.0000 mg | ORAL_CAPSULE | Freq: Two times a day (BID) | ORAL | Status: DC
Start: 2018-04-29 — End: 2018-04-29

## 2018-04-29 MED ORDER — HYDROMORPHONE 2 MG/ML INJECTION SYRINGE
INJECTION | INTRAMUSCULAR | Status: DC
Start: 2018-04-29 — End: 2018-04-30
  Administered 2018-04-29: 0 mg
  Filled 2018-04-29: qty 1

## 2018-04-29 MED ORDER — IPRATROPIUM 0.5 MG-ALBUTEROL 3 MG (2.5 MG BASE)/3 ML NEBULIZATION SOLN
3.00 mL | INHALATION_SOLUTION | Freq: Four times a day (QID) | RESPIRATORY_TRACT | Status: DC
Start: 2018-04-29 — End: 2018-04-29

## 2018-04-29 MED ORDER — METRONIDAZOLE 500 MG/100 ML IN SODIUM CHLOR(ISO) INTRAVENOUS PIGGYBACK
500.0000 mg | INJECTION | Freq: Three times a day (TID) | INTRAVENOUS | Status: DC
Start: 2018-04-29 — End: 2018-04-29
  Administered 2018-04-29: 05:00:00 0 mg via INTRAVENOUS
  Administered 2018-04-29: 500 mg via INTRAVENOUS
  Filled 2018-04-29 (×2): qty 100

## 2018-04-29 MED ORDER — LANOLIN-OXYQUIN-PET, HYDROPHIL TOPICAL OINTMENT
TOPICAL_OINTMENT | Freq: Two times a day (BID) | CUTANEOUS | Status: DC | PRN
Start: 2018-04-29 — End: 2018-05-19
  Filled 2018-04-29 (×3): qty 1

## 2018-04-29 MED ORDER — ACETAMINOPHEN 325 MG/10.15 ML ORAL SUSPENSION
650.0000 mg | ORAL | Status: DC | PRN
Start: 2018-04-29 — End: 2018-05-18
  Administered 2018-04-29 – 2018-05-15 (×17): 650 mg via GASTROSTOMY
  Filled 2018-04-29 (×15): qty 20.3
  Filled 2018-04-29: qty 40.6
  Filled 2018-04-29 (×3): qty 20.3

## 2018-04-29 MED ORDER — SENNA LEAF EXTRACT 176 MG/5 ML ORAL SYRUP
5.00 mL | ORAL_SOLUTION | Freq: Two times a day (BID) | ORAL | Status: DC
Start: 2018-04-29 — End: 2018-05-16
  Administered 2018-04-29 – 2018-05-02 (×7): 176 mg via GASTROSTOMY
  Administered 2018-05-02: 0 mg via GASTROSTOMY
  Administered 2018-05-03 – 2018-05-04 (×4): 176 mg via GASTROSTOMY
  Administered 2018-05-05: 0 mg via GASTROSTOMY
  Administered 2018-05-05: 176 mg via GASTROSTOMY
  Administered 2018-05-06 – 2018-05-07 (×4): 0 mg via GASTROSTOMY
  Administered 2018-05-08 – 2018-05-10 (×6): 176 mg via GASTROSTOMY
  Administered 2018-05-11 – 2018-05-12 (×3): 0 mg via GASTROSTOMY
  Administered 2018-05-12 – 2018-05-13 (×3): 176 mg via GASTROSTOMY
  Administered 2018-05-14 – 2018-05-16 (×5): 0 mg via GASTROSTOMY
  Filled 2018-04-29 (×25): qty 15

## 2018-04-29 MED ORDER — MIDAZOLAM 1 MG/ML INJECTION SOLUTION
2.0000 mg | Freq: Once | INTRAMUSCULAR | Status: AC
Start: 2018-04-30 — End: 2018-04-29
  Administered 2018-04-29: 2 mg via INTRAVENOUS

## 2018-04-29 MED ORDER — MIDAZOLAM 1 MG/ML INJECTION SOLUTION
INTRAMUSCULAR | Status: AC
Start: 2018-04-29 — End: 2018-04-29
  Administered 2018-04-29: 2 mg via INTRAVENOUS
  Filled 2018-04-29: qty 2

## 2018-04-29 MED ORDER — NITROFURANTOIN 25 MG/5 ML ORAL SUSPENSION
50.00 mg | Freq: Four times a day (QID) | ORAL | Status: DC
Start: 2018-04-29 — End: 2018-05-01
  Administered 2018-04-29 – 2018-05-01 (×7): 50 mg via GASTROSTOMY
  Filled 2018-04-29 (×10): qty 10

## 2018-04-29 MED ORDER — POTASSIUM, SODIUM PHOSPHATES 280 MG-160 MG-250 MG ORAL POWDER PACKET
1.0000 | ORAL | Status: AC
Start: 2018-04-29 — End: 2018-04-29
  Administered 2018-04-29: 1 via GASTROSTOMY
  Filled 2018-04-29: qty 1

## 2018-04-29 MED ADMIN — cyanocobalamin (vit B-12) 1,000 mcg/mL injection solution: SUBCUTANEOUS | @ 08:00:00

## 2018-04-29 MED ADMIN — sodium chloride 0.9 % (flush) injection syringe: GASTROSTOMY | @ 17:00:00

## 2018-04-29 MED ADMIN — metroNIDAZOLE 500 mg/100 mL-sodium chloride(iso) intravenous piggyback: INTRAVENOUS | @ 04:00:00

## 2018-04-29 MED ADMIN — senna leaf extract 176 mg/5 mL oral syrup: GASTROSTOMY | @ 08:00:00

## 2018-04-29 MED ADMIN — midazolam 1 mg/mL injection solution: GASTROSTOMY | @ 22:00:00

## 2018-04-29 MED ADMIN — chlorhexidine gluconate 0.12 % mouthwash: ORAL | @ 08:00:00

## 2018-04-29 MED ADMIN — electrolyte-A intravenous solution: INTRAVENOUS | @ 03:00:00

## 2018-04-29 MED ADMIN — sodium chloride 0.9 % (flush) injection syringe: INTRAVENOUS | @ 03:00:00

## 2018-04-29 MED ADMIN — ondansetron HCl (PF) 4 mg/2 mL injection solution: INTRAVENOUS | @ 03:00:00

## 2018-04-29 MED ADMIN — inhalational spacing device: INTRAVENOUS | @ 23:00:00

## 2018-04-29 MED ADMIN — fentaNYL (PF) 50 mcg/mL injection solution: INTRAVENOUS

## 2018-04-29 MED ADMIN — diazePAM 5 mg tablet: INTRAVENOUS | @ 13:00:00

## 2018-04-29 MED ADMIN — sodium chloride 0.9 % (flush) injection syringe: GASTROSTOMY | @ 06:00:00

## 2018-04-29 MED ADMIN — aspirin 81 mg chewable tablet: RESPIRATORY_TRACT | @ 14:00:00

## 2018-04-29 MED ADMIN — furosemide 40 mg tablet: INTRAVENOUS | @ 05:00:00

## 2018-04-29 MED ADMIN — sodium chloride 0.9 % (flush) injection syringe: GASTROSTOMY | @ 08:00:00

## 2018-04-29 MED ADMIN — enoxaparin 40 mg/0.4 mL subcutaneous syringe: INTRAVENOUS | @ 15:00:00

## 2018-04-29 MED ADMIN — heparin, porcine (PF) 100 unit/mL intravenous syringe: RESPIRATORY_TRACT | @ 19:00:00

## 2018-04-29 MED ADMIN — LORazepam 1 mg tablet: SUBCUTANEOUS | @ 08:00:00

## 2018-04-29 MED ADMIN — sodium chloride 0.9 % (flush) injection syringe: @ 14:00:00

## 2018-04-29 MED ADMIN — PREMIXED HEMODIALYSATE W/ ADDITIVES: INTRAVENOUS

## 2018-04-29 NOTE — Care Plan (Signed)
Pt remains intubated on ventilatory support with no signs of desaturation. GCS 3/6/1. Pt is still confused at times causing attempts to reach for lines and airway. The need for restraints is still present. Aspen collar on. Continuous EEG on. OG to low intermittent suction no output. Urine output adequate most hours.

## 2018-04-29 NOTE — Nurses Notes (Signed)
04/29/18 2000   Restraint Type(Q2 Hours)   Restraint Used M   Soft Restraint Right Wrist CONTINUED   Soft Restraint Left Wrist CONTINUED   Pt Assessment (Q2 Hours)   Reason For Restraint Limit ability/pt attempting to remove artificial airway;Limit ability/pt attempting to pull IV lines;Limit ability/pt attempting to pull tubes;Limit falls/pt attempting to get out of bed   Pt's current behavior/status Confused   Emotional Support Provided   Restraint Monitoring (Q2 Hours)   Assessed For Continued Need Yes   Visual Check CF   Circulation NS   Restraint Removed for Assessment Yes   Range of Motion P   Fluids N   Food/Meal/Bottle N   Elimination UC   Education Group (Q2 Hours)   Discontinuation Criteria Able to follow directions;Resolution of contributing medical condition;Discontinuation of medical treatment   Criteria Explained YES   Patient/Co-Learner's Response NR   Restraint Continuation      Patient continues to have the following condition AMS.       Least restrictive alternatives attempted : Increase patient observation, moved closer to the nurses station, bed-exit alarm/motion detector, call light accessible, concealed tubes/lines, decreased/removed stimulus, frequent orientation and reoriented to person, place, time, and treatment    Patient continues to exhibit the following behaviors: AMS    The restraint continued to facilitate medical/surgical treatment to ensure safety.    The patient will continue to be evaluated and assessments documented on the flowsheet to ensure that the patient is released from the restraint at the earliest possible time

## 2018-04-29 NOTE — Respiratory Therapy (Signed)
VENTILATOR - SIMV(PRVC)PS / APV-SIMV (VENTILATOR - SIMV(PRVC)PS / APV-SIMV (Adult Patients Only)) CONTINUOUS Discontinue   Duration: Until Specified    Priority: Routine       Question Answer Comment   FIO2 (%) 30    Peep(cm/H2O) 8    Rate(bpm) 14    Tidal Volume(mls) 500    Pressure Support(cm/H2O) 12    Indications IMPROVE DISTRIBUTION OF VENTILATION         Patient on current vent settings overnight and tolerating well. Patient is not breathing over the vent at this time. Will continue to monitor and wean as tolerated.

## 2018-04-29 NOTE — Nurses Notes (Signed)
Restraint Continuation      Patient continues to have the following condition AMS.       Least restrictive alternatives attempted : Increase patient observation, decreased/removed stimulus, involved patient in conversation and reoriented to person, place, time, and treatment    Patient continues to exhibit the following behaviors: agitated and withdrawn    The restraint continued to facilitate medical/surgical treatment to ensure safety.    The patient will continue to be evaluated and assessments documented on the flowsheet to ensure that the patient is released from the restraint at the earliest possible time.

## 2018-04-29 NOTE — Ancillary Notes (Signed)
SBIRT    SBIRT not completed at this time due to patient's medical status -intubated.  Pending recommendations:  Attempt to complete SBIRT at later time/date.    Jacquelin Hawking LICSW, LICSW Clinical Therapist 04/29/2018, 08:04  Pager  [phone]

## 2018-04-29 NOTE — Nurses Notes (Signed)
RN received call from pt's brother. Pt's brother wishing to get update, updated on injuries and interventions/testing thus far by RN. States brother normally went to [facility] for care and that they always called him to take the patient home so he is unsure why they did not notifiy him that pt presented and was transferred. Brother reports he was told pt was here and updated by cousin. Pts brother gave contact information and is requesting notification with any changes/worsening of pt. Demographics updated by RN, number for younger brother [name] is [phone]. States he calls his brother pretty much everyday to check on him and that he thinks he heard from him last Tuesday-Wednesday. States brother is retired and alcoholic so he does get some assistance from family with going to the laundry mat and to run other errands but otherwise lives alone and is self sufficient/functional. States the patient's cousin takes him to run errands now because they live closer to where the pt lives than [place]. As far as medications, the brother believes he was supposed to be taking something for his blood pressure but is unsure the name/dose/frequency/complaince. As far as PMH, pt states brother cannot see well out of one of his eyes d/t previous cataract surgery that impaired his vision, drinks 2-3 40oz per day, and HTN. As far as MRI clearance, brother denies metal implants to body or hx of welding. Brother's number given to MRI staff. NCCU at bedside for pt assessment. Pt still not following. Care management contacted to appoint HCS.

## 2018-04-29 NOTE — Consults (Signed)
[facility] - HEAD AND NECK SURGERY  CONSULT    Current Date: 04/29/2018  Name: Antonio Gillespie. Orndoff, 58 y.o. male  MRN: [id number]  Date of Birth: Jan 30, 1961  Date of Admission: 04/28/2018    Requesting Service: Trauma [facility] S[facility]  Reason for Consult: Nasal Bone Fractures     History of Present Illness: This is a 58 y.o. male with a history of Afib, COPD, and alcohol abuse admitted to the ICU after being found down for an unknown amount of time covered in feces and urine. He was found to have bilateral scalp hematomas, a humeral fracture, and nasal bone fractures on outside CT scan. A detailed history is difficult to obtain given patient's acute condition.     Past Medical History: Unable to obtain given current acute condition, also intubated and sedated     Past Surgical History: Unable to obtain given current acute condition, also intubated and sedated     Medications:   Unable to obtain given current acute condition, also intubated and sedated        Current Facility-Administered Medications   Medication Dose Route Frequency   . acetaminophen (TYLENOL) 160 mg per 5 mL liquid - grape flavored  650 mg Gastric (NG, OG, PEG, GT) Q4H PRN   . chlorhexidine gluconate (PERIDEX) 0.12% mouthwash  15 mL Swish & Spit 2x/day   . cyanocobalamin (VITAMIN B12) 1000 mcg/mL injection  1,000 mcg Subcutaneous Daily   . electrolyte-A (PLASMALYTE-A) premix infusion   Intravenous Continuous   . enoxaparin PF (LOVENOX) 40 mg/0.4 mL SubQ injection  40 mg Subcutaneous Q12H   . famotidine (PEPCID) tablet  20 mg Gastric (NG, OG, PEG, GT) 2x/day   . HYDROmorphone 2 mg/mL injection ---Cabinet Override       . ipratropium-albuterol 0.5 mg-3 mg(2.5 mg base)/3 mL Solution for Nebulization  3 mL Nebulization Q6H   . lanolin-oxyquin-pet, hydrophil (BAG BALM) topical ointment   Apply Topically 2x/day PRN   . LORazepam (ATIVAN) 2 mg/mL injection  2 mg Intravenous Q8HR    Followed by   . [START ON 04/30/2018] LORazepam  (ATIVAN) 2 mg/mL injection  1 mg Intravenous Q8HR    Followed by   . [START ON 05/02/2018] LORazepam (ATIVAN) 2 mg/mL injection  0.5 mg Intravenous Q8HR   . nitrofurantoin (FURADANTIN) 26m per 566moral liquid  50 mg Gastric (NG, OG, PEG, GT) 4x/day   . NS flush syringe  2 mL Intracatheter Q8HRS    And   . NS flush syringe  2-6 mL Intracatheter Q1 MIN PRN   . perflutren lipid microspheres (DEFINITY) 1.3 mL in NS 10 mL (tot vol) injection  2 mL Intravenous Give in Cardiology   . senna concentrate (SENNA) 52826mer 26m91mal liquid  5 mL Gastric (NG, OG, PEG, GT) 2x/day   . SSIP insulin R human (HUMULIN R) 100 units/mL injection  0-12 Units Subcutaneous Q4H PRN   . [START ON 04/30/2018] thiamine (VITAMIN B1) 250 mg in NS 50 mL IVPB  250 mg Intravenous Q24H   . thiamine (VITAMIN B1) 500 mg in NS 50 mL IVPB  500 mg Intravenous 3x/day   . [START ON 05/04/2018] thiamine-vitamin B1 (BETAXIN) tablet  50 mg Gastric (NG, OG, PEG, GT) 2x/day        Allergies: No Known Allergies    Social History:   Social History     Occupational History   . Not on file   Tobacco Use   .  Smoking status: Not on file   Substance and Sexual Activity   . Alcohol use: Not on file   . Drug use: Not on file   . Sexual activity: Not on file       Family History: Unable to obtain given current acute condition, also intubated and sedated     Review of Systems:   Unable to obtain given current acute condition, also intubated and sedated     Physical Exam:  BP 106/84   Pulse (!) 104   Temp 36.5 C (97.7 F)   Resp 19   Ht 1.73 m (5' 8.11")   Wt 90.4 kg (199 lb 4.7 oz)   SpO2 (!) 86%   BMI 30.20 kg/m       General Appearance: Lying in bed, intubated and sedated   Eyes: Conjunctiva clear, PERRL  External Ears: Normal pinnae shape and position and no signs of inflammation  External Auditory Canal - Left: Patent without inflammation.  Tympanic Membrane - Left: intact, translucent, midposition, middle ear aerated  External Auditory Canal - Right: Patent  without inflammation.  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 No crepitus of nasal bones or instability.   Oral Cavity/Oropharynx: Difficult to examine given intubated, although fair dentition, bruising and crusting of left upper lip.   Hypopharynx/Larynx: Deferred, intubated.   Heme/Lymph: C-collar in place.   Respiratory: No stridor or stertor. No acute respiratory distress.  Gastrointestinal: Abdomen soft, non-tender, and non-distended without rebound or guarding.   Neurologic: Patient does not follow commands, attempted to examine EOMs, patient moved eyes left but stopped following when prompted to shift right  Psych: Cooperative with exam. Answers questions appropriately.   Musculoskeletal: Normocephalic, atraumatic. Extremities well perfused and moving all extremities.   Skin: Warm and dry.    Labs:    Reviewed:    I have reviewed all lab results.  Lab Results Today:    Results for orders placed or performed during the hospital encounter of 04/28/18 (from the past 24 hour(s))   ECG 12-LEAD   Result Value Ref Range    Ventricular rate 111 BPM    Atrial Rate 111 BPM    PR Interval 160 ms    QRS Duration 84 ms    QT Interval 400 ms    QTC Calculation 544 ms    Calculated P Axis 50 degrees    Calculated R Axis 40 degrees    Calculated T Axis -129 degrees   CREATINE KINASE (CK), TOTAL, SERUM   Result Value Ref Range    CREATINE KINASE 368 (H) 45 - 225 U/L   ARTERIAL BLOOD GAS/LACTATE   Result Value Ref Range    %FIO2 (ARTERIAL) 40 %    PH (ARTERIAL) 7.39 7.35 - 7.45    PCO2 (ARTERIAL) 42.0 35.0 - 45.0 mm/Hg    PO2 (ARTERIAL) 161.0 (H) 72.0 - 100.0 mm/Hg    BASE EXCESS (ARTERIAL) 0.3 0.0 - 1.0 mmol/L    BICARBONATE (ARTERIAL) 25.2 18.0 - 26.0 mmol/L    LACTATE 2.0 (H) 0.0 - 1.3 mmol/L    PAO2/FIO2 RATIO 403 <=200   TROPONIN-I    Result Value Ref Range    TROPONIN I 130 (H) 0 - 30 ng/L   ETHANOL, SERUM   Result Value Ref Range    ETHANOL <10 <10 mg/dL    ETHANOL None  Detected    LIPASE   Result Value Ref Range    LIPASE 6 (L) 10 - 80 U/L   AMMONIA   Result Value Ref Range    AMMONIA 35 15 - 50 umol/L   TROPONIN-I   Result Value Ref Range    TROPONIN I 153 (H) 0 - 30 ng/L   CREATINE KINASE (CK), TOTAL, SERUM   Result Value Ref Range    CREATINE KINASE 267 (H) 45 - 225 U/L   BASIC METABOLIC PANEL   Result Value Ref Range    SODIUM 140 136 - 145 mmol/L    POTASSIUM 3.2 (L) 3.5 - 5.1 mmol/L    CHLORIDE 108 96 - 111 mmol/L    CO2 TOTAL 26 22 - 32 mmol/L    ANION GAP 6 4 - 13 mmol/L    CALCIUM 6.9 (L) 8.5 - 10.2 mg/dL    GLUCOSE 96 65 - 139 mg/dL    BUN 4 (L) 8 - 25 mg/dL    CREATININE 0.68 0.62 - 1.27 mg/dL    BUN/CREA RATIO 6 6 - 22    ESTIMATED GFR >60 >60 mL/min/1.11m2   MAGNESIUM   Result Value Ref Range    MAGNESIUM 1.7 1.6 - 2.6 mg/dL   PHOSPHORUS   Result Value Ref Range    PHOSPHORUS 2.1 (L) 2.4 - 4.7 mg/dL   PROCALCITONIN, SERUM   Result Value Ref Range    PROCALCITONIN SERUM 1.02 (H) <0.50 ng/mL   ARTERIAL BLOOD GAS/LACTATE   Result Value Ref Range    %FIO2 (ARTERIAL) 30 %    PH (ARTERIAL) 7.45 7.35 - 7.45    PCO2 (ARTERIAL) 38.0 35.0 - 45.0 mm/Hg    PO2 (ARTERIAL) 138.0 (H) 72.0 - 100.0 mm/Hg    BASE EXCESS (ARTERIAL) 2.4 (H) 0.0 - 1.0 mmol/L    BICARBONATE (ARTERIAL) 26.9 (H) 18.0 - 26.0 mmol/L    LACTATE 1.4 (H) 0.0 - 1.3 mmol/L    PAO2/FIO2 RATIO 460 <=200   CBC   Result Value Ref Range    WBC 5.5 3.7 - 11.0 x10^3/uL    RBC 2.61 (L) 4.50 - 6.10 x10^6/uL    HGB 6.8 (LL) 13.4 - 17.5 g/dL    HCT 23.0 (L) 38.9 - 52.0 %    MCV 88.1 78.0 - 100.0 fL    MCH 26.1 26.0 - 32.0 pg    MCHC 29.6 (L) 31.0 - 35.5 g/dL    RDW-CV 22.9 (H) 11.5 - 15.5 %    PLATELETS 130 (L) 150 - 400 x10^3/uL   POC BLOOD GLUCOSE (RESULTS)   Result Value Ref Range    GLUCOSE, POC 102 70 - 105 mg/dl   CBC WITH DIFF   Result Value Ref Range    WBC 5.3 3.7 - 11.0  x10^3/uL    RBC 2.62 (L) 4.50 - 6.10 x10^6/uL    HGB 6.7 (LL) 13.4 - 17.5 g/dL    HCT 23.3 (L) 38.9 - 52.0 %    MCV 88.9 78.0 - 100.0 fL    MCH 25.6 (L) 26.0 - 32.0 pg    MCHC 28.8 (L) 31.0 - 35.5 g/dL    RDW-CV 23.0 (H) 11.5 - 15.5 %    PLATELETS 128 (L) 150 - 400 x10^3/uL   MANUAL DIFF AND MORPHOLOGY-SYSMEX   Result Value Ref Range    NEUTROPHIL % 97 %    LYMPHOCYTE %  2 %    MONOCYTE % 1 %    EOSINOPHIL % 0 %  BASOPHIL % 0 %    NEUTROPHIL # 5.14 1.50 - 7.70 x10^3/uL    LYMPHOCYTE # 0.11 (L) 1.00 - 4.80 x10^3/uL    MONOCYTE # <0.10 (L) 0.20 - 1.10 x10^3/uL    EOSINOPHIL # <0.10 <=0.50 x10^3/uL    BASOPHIL # <0.10 <=0.20 x10^3/uL    NRBC FROM MANUAL DIFF 0 per 100 WBC    ANISOCYTOSIS 2+/Moderate (A) None   TYPE AND SCREEN   Result Value Ref Range    UNITS ORDERED 1     ABO/RH(D) O NEGATIVE     ANTIBODY SCREEN NEGATIVE     SPECIMEN EXPIRATION DATE 05/02/2018    CROSSMATCH RED CELLS - UNITS , 1 Units   Result Value Ref Range    Coding System [id number]     UNIT NUMBER [id number]     BLOOD COMPONENT TYPE LRCPD, 04350     UNIT DIVISION 00     UNIT DISPENSE STATUS ISSUED     TRANSFUSION STATUS OK TO TRANSFUSE     IS CROSSMATCH Electronically Compatible     Product Code [id number]    BRONCHOALVEOLAR LAVAGE (BAL) QUANTITATIVE CULTURE/GRAM STAIN   Result Value Ref Range    GRAM STAIN 4+ Many PMNs     GRAM STAIN No Organisms Seen    CREATINE KINASE (CK), TOTAL, SERUM   Result Value Ref Range    CREATINE KINASE 249 (H) 45 - 225 U/L   ARTERIAL BLOOD GAS/LACTATE   Result Value Ref Range    %FIO2 (ARTERIAL) 30 %    PH (ARTERIAL) 7.47 (H) 7.35 - 7.45    PCO2 (ARTERIAL) 36.0 35.0 - 45.0 mm/Hg    PO2 (ARTERIAL) 117.0 (H) 72.0 - 100.0 mm/Hg    BASE EXCESS (ARTERIAL) 2.7 (H) 0.0 - 1.0 mmol/L    BICARBONATE (ARTERIAL) 27.1 (H) 18.0 - 26.0 mmol/L    LACTATE 1.2 0.0 - 1.3 mmol/L    PAO2/FIO2 RATIO 390 <=200   TROPONIN-I   Result Value Ref Range    TROPONIN I 149 (H) 0 - 30 ng/L   ARTERIAL BLOOD GAS/LACTATE   Result Value Ref Range     %FIO2 (ARTERIAL) 30 %    PH (ARTERIAL) 7.45 7.35 - 7.45    PCO2 (ARTERIAL) 38.0 35.0 - 45.0 mm/Hg    PO2 (ARTERIAL) 113.0 (H) 72.0 - 100.0 mm/Hg    BASE EXCESS (ARTERIAL) 2.4 (H) 0.0 - 1.0 mmol/L    BICARBONATE (ARTERIAL) 26.8 (H) 18.0 - 26.0 mmol/L    LACTATE 1.2 0.0 - 1.3 mmol/L    PAO2/FIO2 RATIO 377 <=200   H & H   Result Value Ref Range    HGB 7.6 (L) 13.4 - 17.5 g/dL    HCT 25.6 (L) 38.9 - 52.0 %   POC BLOOD GLUCOSE (RESULTS)   Result Value Ref Range    GLUCOSE, POC 98 70 - 105 mg/dl   CREATINE KINASE (CK), TOTAL, SERUM   Result Value Ref Range    CREATINE KINASE 286 (H) 45 - 225 U/L   ARTERIAL BLOOD GAS/LACTATE   Result Value Ref Range    %FIO2 (ARTERIAL) 30 %    PH (ARTERIAL) 7.47 (H) 7.35 - 7.45    PCO2 (ARTERIAL) 38.0 35.0 - 45.0 mm/Hg    PO2 (ARTERIAL) 133.0 (H) 72.0 - 100.0 mm/Hg    BASE EXCESS (ARTERIAL) 3.9 (H) 0.0 - 1.0 mmol/L    BICARBONATE (ARTERIAL) 28.0 (H) 18.0 - 26.0 mmol/L    LACTATE 1.1 0.0 - 1.3 mmol/L  PAO2/FIO2 RATIO 443 <=200   TROPONIN-I   Result Value Ref Range    TROPONIN I 145 (H) 0 - 30 ng/L   MRSA SCREEN, PCR, RAPID   Result Value Ref Range    MRSA COLONIZATION SCREEN Negative Negative   POC BLOOD GLUCOSE (RESULTS)   Result Value Ref Range    GLUCOSE, POC 68 (L) 70 - 105 mg/dl   TROPONIN-I   Result Value Ref Range    TROPONIN I 82 (H) 0 - 30 ng/L   THYROID STIMULATING HORMONE WITH FREE T4 REFLEX   Result Value Ref Range    TSH 0.421 0.350 - 5.000 uIU/mL   DRUG SCREEN, NO CONFIRMATION, URINE   Result Value Ref Range    BUPRENORPHINE URINE Negative Negative    CANNABINOIDS URINE Negative Negative    COCAINE METABOLITES URINE Negative Negative    METHADONE URINE Negative Negative    OPIATES URINE (LOW CUTOFF) Negative Negative    OXYCODONE URINE Negative Negative    ECSTASY/MDMA URINE Negative Negative    CREATININE RANDOM URINE 54 No Reference Range Established mg/dL    AMPHETAMINES URINE Negative Negative    BARBITURATES URINE Negative Negative    BENZODIAZEPINES URINE Positive (A)  Negative    OXIDANT-ADULTERATION Negative Negative    PH-ADULTERATION 5.6 4.5 - 9.0    SPECIFIC GRAVITY-ADULTERATION 1.017 1.005 - 1.030 g/mL   POC BLOOD GLUCOSE (RESULTS)   Result Value Ref Range    GLUCOSE, POC 89 70 - 105 mg/dl       Imaging Studies:      Films were directly reviewed on 04/29/2018. My interpretation of CT facial bones from outside facility from 04/29/2018 reveals bilateral nasal bone fractures, likely acute on the right and chronic on the left. The right appears minimally displaced, the left chronic fracture appears depressed. No other facial fractures identified.     Assessment:   58 y.o. male with bilateral nasal bone fractures    Plan/Recommendations:    1. Nasal bone Fractures  -No acute ENT surgical intervention  -Left-sided injuries likely old, right acute and minimally displaced  -Will attempt to talk with patient once patient becomes more alert, will need to assess function  -Can offer outpatient appointment if issues with nasal patency, cosmesis     2. Encephalopathy   -Agree with neurology workup and close monitoring     This patient was seen independently by me with Dr. Glade Stanford as [facility] faculty only.    Bartholomew Boards, PA-C  04/29/2018, 17:46  Department of Otolaryngology        I saw and examined the patient today.  I reviewed the PA's note.  I agree with the findings and plan of care as documented in the PA's note.  Any exceptions/additions are edited/noted below.    Based on physical exam, there are no mobile segments of nasal bones. It feels as if all are old fracture. Non operative.     Marjory Sneddon, MD 04/30/2018, 11:41

## 2018-04-29 NOTE — Nurses Notes (Signed)
Pt taken to CT. Pt agitated with stimulation but able to tolerate scan.

## 2018-04-29 NOTE — Progress Notes (Addendum)
[facility]                                               SICU PROGRESS NOTE    Antonio Gillespie, Antonio Gillespie.  Date of Admission:  04/28/2018  Date of Service: 04/29/2018  Date of Birth:  27-Nov-1960    Primary Attending:  Dr. [name]  Primary Service:  Trauma    Post Op Day:  S/P      LOS: 1 day      Subjective:    Intubated/Sedated    24-hour Events:  Direct admit from outside hospital  hernia reduced by trauma surgery  not following commands this AM  EEG in progress    Vital Signs:  Temp (24hrs) Max:38.3 C (992.4 F)      Systolic (26STM), HDQ:222 , Min:83 , LNL:892     Diastolic (11HER), DEY:81, Min:49, Max:117    Temp  Avg: 37.6 C (99.6 F)  Min: 36.8 C (98.2 F)  Max: 38.3 C (100.9 F)  MAP (Non-Invasive)  Avg: 84.2 mmHG  Min: 61 mmHG  Max: 123 mmHG  Pulse  Avg: 118.4  Min: 106  Max: 129  Resp  Avg: 17.2  Min: 10  Max: 32  SpO2  Avg: 97.8 %  Min: 94 %  Max: 100 %       Meds  No current outpatient medications on file.       Physical Exam:   General:  Acutely ill traumatic patient  Eyes:  Conjunctiva clear  HENT:  NCAT, Mucous membranes moist. Intubated. Fascial swelling. Scalp laceration  Lungs:  Coarse breath sounds bilaterally, Normal respiratory effort  Cardiovascular:  Regular rhythm tachycardic.   Abdomen:  S, NT, ND  GU: Swollen scrotum  Extremities:  Right elbow deformity  Skin:  Skin warm and dry.  Neurologic:  Intubated minimal response to stimulus.     Labs:  Lab Results Today:    Results for orders placed or performed during the hospital encounter of 04/28/18 (from the past 24 hour(s))   BUN   Result Value Ref Range    BUN 3 (L) 8 - 25 mg/dL   CREATININE   Result Value Ref Range    CREATININE 0.72 0.62 - 1.27 mg/dL    ESTIMATED GFR >60 >60 mL/min/1.54m2   GLUCOSE, NON FASTING   Result Value Ref Range    GLUCOSE 120 65 - 139 mg/dL   PT/INR   Result Value Ref Range    PROTHROMBIN TIME 16.1 (H) 9.1 - 13.9 seconds    INR 1.39 (H) 0.80 - 1.20   PTT (PARTIAL  THROMBOPLASTIN TIME)   Result Value Ref Range    APTT 27.8 24.2 - 37.5 seconds   CBC WITH DIFF   Result Value Ref Range    WBC 9.0 3.7 - 11.0 x10^3/uL    RBC 3.58 (L) 4.50 - 6.10 x10^6/uL    HGB 9.4 (L) 13.4 - 17.5 g/dL    HCT 32.2 (L) 38.9 - 52.0 %    MCV 89.9 78.0 - 100.0 fL    MCH 26.3 26.0 - 32.0 pg    MCHC 29.2 (L) 31.0 - 35.5 g/dL    RDW-CV 23.1 (H) 11.5 - 15.5 %    PLATELETS 185 150 - 400 x10^3/uL    MPV 10.8 8.7 - 12.5 fL    NEUTROPHIL % 79 %  LYMPHOCYTE % 11 %    MONOCYTE % 9 %    EOSINOPHIL % 1 %    BASOPHIL % 0 %    NEUTROPHIL # 7.12 1.50 - 7.70 x10^3/uL    LYMPHOCYTE # 0.98 (L) 1.00 - 4.80 x10^3/uL    MONOCYTE # 0.78 0.20 - 1.10 x10^3/uL    EOSINOPHIL # <0.10 <=0.50 x10^3/uL    BASOPHIL # <0.10 <=0.20 x10^3/uL    IMMATURE GRANULOCYTE % 0 0 - 1 %    IMMATURE GRANULOCYTE # <0.10 <0.10 x10^3/uL   CREATINE KINASE (CK), TOTAL, SERUM   Result Value Ref Range    CREATINE KINASE 402 (H) 45 - 225 U/L   HEPATIC FUNCTION PANEL   Result Value Ref Range    ALBUMIN 1.8 (L) 3.5 - 5.0 g/dL    ALKALINE PHOSPHATASE 238 (H) 45 - 115 U/L    ALT (SGPT) 33 <55 U/L    AST (SGOT) 55 (H) 8 - 48 U/L    BILIRUBIN TOTAL 1.6 (H) 0.3 - 1.3 mg/dL    BILIRUBIN DIRECT 1.2 (H) <0.3 mg/dL    PROTEIN TOTAL 5.7 (L) 6.4 - 8.3 g/dL   MAGNESIUM   Result Value Ref Range    MAGNESIUM 1.7 1.6 - 2.6 mg/dL   PHOSPHORUS   Result Value Ref Range    PHOSPHORUS 2.5 2.4 - 4.7 mg/dL   MORPHOLOGY   Result Value Ref Range    RBC MORPHOLOGY Normal RBC and PLT Morphology    TROPONIN-I   Result Value Ref Range    TROPONIN I 124 (H) 0 - 30 ng/L   BASIC METABOLIC PANEL   Result Value Ref Range    SODIUM 137 136 - 145 mmol/L    POTASSIUM 3.6 3.5 - 5.1 mmol/L    CHLORIDE 107 96 - 111 mmol/L    CO2 TOTAL 21 (L) 22 - 32 mmol/L    ANION GAP 9 4 - 13 mmol/L    CALCIUM 7.3 (L) 8.5 - 10.2 mg/dL    GLUCOSE 119 65 - 139 mg/dL    BUN 3 (L) 8 - 25 mg/dL    CREATININE 0.72 0.62 - 1.27 mg/dL    BUN/CREA RATIO 4 (L) 6 - 22    ESTIMATED GFR >60 >60 mL/min/1.59m2   POC BLOOD  GLUCOSE (RESULTS)   Result Value Ref Range    GLUCOSE, POC 124 (H) 70 - 105 mg/dl   LACTIC ACID LEVEL   Result Value Ref Range    LACTIC ACID 3.5 (H) 0.5 - 2.2 mmol/L   URINALYSIS, MACROSCOPIC   Result Value Ref Range    SPECIFIC GRAVITY 1.044 (H) 1.005 - 1.030    GLUCOSE Negative Negative mg/dL    PROTEIN 100  (A) Negative mg/dL    BILIRUBIN Small (A) Negative mg/dL    UROBILINOGEN 4.0   (A) Negative mg/dL    PH 5.0 5.0 - 8.0    BLOOD Small (A) Negative mg/dL    KETONES Negative Negative mg/dL    NITRITE Negative Negative    LEUKOCYTES Moderate (A) Negative WBCs/uL    APPEARANCE Cloudy (A) Clear    COLOR Orange (A) Normal (Yellow)   URINALYSIS, MICROSCOPIC   Result Value Ref Range    WBCS 143.0 (H) <4.0 /hpf    RBCS 21.0 (H) <6.0 /hpf    BACTERIA Few (A) Occasional or less /hpf    HYALINE CASTS 119.0 (H) <4.0 /lpf    GRANULAR CASTS 69 (H) <=0 /lpf    SQUAMOUS EPITHELIAL  CELLS Occasional or less Occasional or less /lpf    MUCOUS Light Light /lpf   ARTERIAL BLOOD GAS   Result Value Ref Range    %FIO2 (ARTERIAL) 50 %    BICARBONATE (ARTERIAL) 25.1 18.0 - 26.0 mmol/L    PCO2 (ARTERIAL) 46.0 (H) 35.0 - 45.0 mm/Hg    PH (ARTERIAL) 7.36 7.35 - 7.45    PO2 (ARTERIAL) 159.0 (H) 72.0 - 100.0 mm/Hg    BASE EXCESS (ARTERIAL) 0.1 0.0 - 1.0 mmol/L    PAO2/FIO2 RATIO 318 <=200   ECG 12-LEAD   Result Value Ref Range    Ventricular rate 111 BPM    Atrial Rate 111 BPM    PR Interval 160 ms    QRS Duration 84 ms    QT Interval 400 ms    QTC Calculation 544 ms    Calculated P Axis 50 degrees    Calculated R Axis 40 degrees    Calculated T Axis -129 degrees   CREATINE KINASE (CK), TOTAL, SERUM   Result Value Ref Range    CREATINE KINASE 368 (H) 45 - 225 U/L   ARTERIAL BLOOD GAS/LACTATE   Result Value Ref Range    %FIO2 (ARTERIAL) 40 %    PH (ARTERIAL) 7.39 7.35 - 7.45    PCO2 (ARTERIAL) 42.0 35.0 - 45.0 mm/Hg    PO2 (ARTERIAL) 161.0 (H) 72.0 - 100.0 mm/Hg    BASE EXCESS (ARTERIAL) 0.3 0.0 - 1.0 mmol/L    BICARBONATE (ARTERIAL) 25.2  18.0 - 26.0 mmol/L    LACTATE 2.0 (H) 0.0 - 1.3 mmol/L    PAO2/FIO2 RATIO 403 <=200   TROPONIN-I   Result Value Ref Range    TROPONIN I 130 (H) 0 - 30 ng/L   ETHANOL, SERUM   Result Value Ref Range    ETHANOL <10 <10 mg/dL    ETHANOL None Detected    LIPASE   Result Value Ref Range    LIPASE 6 (L) 10 - 80 U/L   AMMONIA   Result Value Ref Range    AMMONIA 35 15 - 50 umol/L   TROPONIN-I   Result Value Ref Range    TROPONIN I 153 (H) 0 - 30 ng/L   CREATINE KINASE (CK), TOTAL, SERUM   Result Value Ref Range    CREATINE KINASE 267 (H) 45 - 225 U/L   BASIC METABOLIC PANEL   Result Value Ref Range    SODIUM 140 136 - 145 mmol/L    POTASSIUM 3.2 (L) 3.5 - 5.1 mmol/L    CHLORIDE 108 96 - 111 mmol/L    CO2 TOTAL 26 22 - 32 mmol/L    ANION GAP 6 4 - 13 mmol/L    CALCIUM 6.9 (L) 8.5 - 10.2 mg/dL    GLUCOSE 96 65 - 139 mg/dL    BUN 4 (L) 8 - 25 mg/dL    CREATININE 0.68 0.62 - 1.27 mg/dL    BUN/CREA RATIO 6 6 - 22    ESTIMATED GFR >60 >60 mL/min/1.61m2   MAGNESIUM   Result Value Ref Range    MAGNESIUM 1.7 1.6 - 2.6 mg/dL   PHOSPHORUS   Result Value Ref Range    PHOSPHORUS 2.1 (L) 2.4 - 4.7 mg/dL   PROCALCITONIN, SERUM   Result Value Ref Range    PROCALCITONIN SERUM 1.02 (H) <0.50 ng/mL   ARTERIAL BLOOD GAS/LACTATE   Result Value Ref Range    %FIO2 (ARTERIAL) 30 %    PH (ARTERIAL) 7.45 7.35 -  7.45    PCO2 (ARTERIAL) 38.0 35.0 - 45.0 mm/Hg    PO2 (ARTERIAL) 138.0 (H) 72.0 - 100.0 mm/Hg    BASE EXCESS (ARTERIAL) 2.4 (H) 0.0 - 1.0 mmol/L    BICARBONATE (ARTERIAL) 26.9 (H) 18.0 - 26.0 mmol/L    LACTATE 1.4 (H) 0.0 - 1.3 mmol/L    PAO2/FIO2 RATIO 460 <=200   CBC   Result Value Ref Range    WBC 5.5 3.7 - 11.0 x10^3/uL    RBC 2.61 (L) 4.50 - 6.10 x10^6/uL    HGB 6.8 (LL) 13.4 - 17.5 g/dL    HCT 23.0 (L) 38.9 - 52.0 %    MCV 88.1 78.0 - 100.0 fL    MCH 26.1 26.0 - 32.0 pg    MCHC 29.6 (L) 31.0 - 35.5 g/dL    RDW-CV 22.9 (H) 11.5 - 15.5 %    PLATELETS 130 (L) 150 - 400 x10^3/uL   POC BLOOD GLUCOSE (RESULTS)   Result Value Ref Range     GLUCOSE, POC 102 70 - 105 mg/dl   CBC WITH DIFF   Result Value Ref Range    WBC 5.3 3.7 - 11.0 x10^3/uL    RBC 2.62 (L) 4.50 - 6.10 x10^6/uL    HGB 6.7 (LL) 13.4 - 17.5 g/dL    HCT 23.3 (L) 38.9 - 52.0 %    MCV 88.9 78.0 - 100.0 fL    MCH 25.6 (L) 26.0 - 32.0 pg    MCHC 28.8 (L) 31.0 - 35.5 g/dL    RDW-CV 23.0 (H) 11.5 - 15.5 %    PLATELETS 128 (L) 150 - 400 x10^3/uL   MANUAL DIFF AND MORPHOLOGY-SYSMEX   Result Value Ref Range    NEUTROPHIL % 97 %    LYMPHOCYTE %  2 %    MONOCYTE % 1 %    EOSINOPHIL % 0 %    BASOPHIL % 0 %    NEUTROPHIL # 5.14 1.50 - 7.70 x10^3/uL    LYMPHOCYTE # 0.11 (L) 1.00 - 4.80 x10^3/uL    MONOCYTE # <0.10 (L) 0.20 - 1.10 x10^3/uL    EOSINOPHIL # <0.10 <=0.50 x10^3/uL    BASOPHIL # <0.10 <=0.20 x10^3/uL    NRBC FROM MANUAL DIFF 0 per 100 WBC    ANISOCYTOSIS 2+/Moderate (A) None   TYPE AND SCREEN   Result Value Ref Range    UNITS ORDERED 1     ABO/RH(D) O NEGATIVE     ANTIBODY SCREEN NEGATIVE     SPECIMEN EXPIRATION DATE 05/02/2018    CROSSMATCH RED CELLS - UNITS , 1 Units   Result Value Ref Range    Coding System [id number]     UNIT NUMBER [id number]     BLOOD COMPONENT TYPE LRCPD, 04350     UNIT DIVISION 00     UNIT DISPENSE STATUS ISSUED     TRANSFUSION STATUS OK TO TRANSFUSE     IS CROSSMATCH Electronically Compatible     Product Code [id number]    BRONCHOALVEOLAR LAVAGE (BAL) QUANTITATIVE CULTURE/GRAM STAIN   Result Value Ref Range    GRAM STAIN 4+ Many PMNs     GRAM STAIN No Organisms Seen    CREATINE KINASE (CK), TOTAL, SERUM   Result Value Ref Range    CREATINE KINASE 249 (H) 45 - 225 U/L   ARTERIAL BLOOD GAS/LACTATE   Result Value Ref Range    %FIO2 (ARTERIAL) 30 %    PH (ARTERIAL) 7.47 (H) 7.35 - 7.45    PCO2 (  ARTERIAL) 36.0 35.0 - 45.0 mm/Hg    PO2 (ARTERIAL) 117.0 (H) 72.0 - 100.0 mm/Hg    BASE EXCESS (ARTERIAL) 2.7 (H) 0.0 - 1.0 mmol/L    BICARBONATE (ARTERIAL) 27.1 (H) 18.0 - 26.0 mmol/L    LACTATE 1.2 0.0 - 1.3 mmol/L    PAO2/FIO2 RATIO 390 <=200   TROPONIN-I   Result Value Ref  Range    TROPONIN I 149 (H) 0 - 30 ng/L   ARTERIAL BLOOD GAS/LACTATE   Result Value Ref Range    %FIO2 (ARTERIAL) 30 %    PH (ARTERIAL) 7.45 7.35 - 7.45    PCO2 (ARTERIAL) 38.0 35.0 - 45.0 mm/Hg    PO2 (ARTERIAL) 113.0 (H) 72.0 - 100.0 mm/Hg    BASE EXCESS (ARTERIAL) 2.4 (H) 0.0 - 1.0 mmol/L    BICARBONATE (ARTERIAL) 26.8 (H) 18.0 - 26.0 mmol/L    LACTATE 1.2 0.0 - 1.3 mmol/L    PAO2/FIO2 RATIO 377 <=200   H & H   Result Value Ref Range    HGB 7.6 (L) 13.4 - 17.5 g/dL    HCT 25.6 (L) 38.9 - 52.0 %   POC BLOOD GLUCOSE (RESULTS)   Result Value Ref Range    GLUCOSE, POC 98 70 - 105 mg/dl       Recent Imaging:       Assessment/ Plan:  Active Hospital Problems    Diagnosis   . Primary Problem: Fall   . Hypokalemia   . Alcohol abuse   . Scalp hematoma   . Humeral fracture   . UTI (urinary tract infection)   . Left inguinal hernia   . [place] Lar[place] is a 58 y.o. male who is   admitted after being found down    Lines: Right Fem Triple lumen, Right Fem Art line, PIV's     NEURO:  No longer following commands  Imaging:     Outside facility imaging                  CT-C-spine:   "Impression: Large bilateral scalp hematomas, left greater than right but no evidence of acute intracranial abnormality. No evidence of acute fracture or traumatic subluxation of cervical spine."                  CT max/facial w/o contrast: Chronic bilateral nasal bone fractures although is difficult to exclude superimposed acute on chronic injury as there is soft tissue swelling in this region.                   CT Brain: No acute intracranial process.    Sedation/analgesia:     CIWA protocol - 2 doses given so far  Thiamine + B12    Will obtain CTA intra/extra  If no explanation for AMS, will obtain MRI head  Possible NCCU consult     CARDIOVASCULAR:  Systolic (06YIR), SWN:462 , Min:83 , VOJ:500     Diastolic (93GHW), EXH:37, Min:49, Max:117     ART-Line  MAP: 80 mmHg   Troponins: 130 --> 153 --> 149 --> 145   No known cardiac  history  Evidence for pHTN on CT  No pressors    Low grade tachycardia, continue to monitor, may need beta blocker    PULMONARY:  Airway Ventilator Settings   EndoTracheal Tube Oral 7.5 23 cm Lip (Active)   Airway Secure Device 04/29/2018  3:50 AM   Position Change Yes 04/29/2018  3:50  AM   Change Reason Routine 04/29/2018  3:50 AM   Bilateral General Breath Sounds Coarse 04/28/2018  3:51 PM    Conventional settings:  Mode: SPONT  Set VT: 500 mL  Set Rate: 14 Breaths Per Minute  Set PEEP: 5 cmH2O  Pressure Support: 8 cmH2O  FiO2: 30 %  I:E Ratio: 1:2.0   SpO2  Avg: 97.8 %  Min: 94 %  Max: 100 %  Blood Gas:  Recent Labs     04/29/18  0031 04/29/18  0413 04/29/18  0558   FI02 _0 PH 7.45 7.47* 7.45   PCO2 38.0 36.0 38.0   PO2 138.0* 117.0* 113.0*   BICARBONATE 26.9* 27.1* 26.8*   BASEEXCESS 2.4* 2.7* 2.4*     Nebs: None  CXR stable    GI:  MNT PROTOCOL FOR DIETITIAN   Recent Labs     04/28/18  1613 04/28/18  1711 04/28/18  2016   BILIRUBIN  --  Small*  --    ALBUMIN 1.8*  --   --    LIPASE  --   --  6*     Last BM: Last Bowel Movement: (PTA)  Proph: Pepcid  Bowel regimen: Colace, Senna  Will start tube feeds    RENAL/GU:  Recent Labs     04/28/18  1613 04/28/18  1711  04/29/18  0030 04/29/18  0031 04/29/18  0413 04/29/18  0558   SODIUM 137  --   --  140  --   --   --    POTASSIUM 3.6  --   --  3.2*  --   --   --    CHLORIDE 107  --   --  108  --   --   --    BICARBONATE  --   --    < >  --  26.9* 27.1* 26.8*   BUN 3*  3*  --   --  4*  --   --   --    CREATININE 0.72  0.72  --   --  0.68  --   --   --    GLUCOSE  --  Negative  --   --   --   --   --    ANIONGAP 9  --   --  6  --   --   --    CALCIUM 7.3*  --   --  6.9*  --   --   --    MAGNESIUM 1.7  --   --  1.7  --   --   --    PHOSPHORUS 2.5  --   --  2.1*  --   --   --     < > = values in this interval not displayed.       I/O last 24 hours:      Intake/Output Summary (Last 24 hours) at 04/29/2018 0631  Last data filed at 04/29/2018 0600  Gross per 24 hour      Intake 3526 ml   Output 555 ml   Net 2971 ml     I/O current shift:  03/13 1900 - 03/14 0659  In: 8110 [P.O.:120; I.V.:2755; Blood:271]  Out: 335 [Urine:335]  CK 267 --> 249 --> 286  UO 235 yesterday here (2.6 ml/kg)  Creatinine normal      HEME:  Recent Labs     04/28/18  1613 04/29/18  0031 04/29/18  0100 04/29/18  0558   HGB 9.4* 6.8* 6.7* 7.6*   HCT 32.2* 23.0* 23.3* 25.6*   PLTCNT 185 130* 128*  --    APTT 27.8  --   --   --    INR 1.39*  --   --   --      Transfusions: 1 unit overnight with appropriate response  Proph: SCD's, Lovenox    ID:  Temp (24hrs) Max:38.3 C (100.9 F)    Recent Labs     04/28/18  1613 04/29/18  0031 04/29/18  0100   WBC 9.0 5.5 5.3   PMNS 79  --  97     Blood cultures: In process  Urine cultures: Pending, moderate leukocytes  BAL: 4+ PMN no organisms  OR cultures: N/A    ABX: Cefepime Flagyl Vanc    ENDO:  No results for input(s): GLUCOSEPOC in the last 24 hours.  SSI, no coverage required    MSK:  Elevate scrotum  Bag balm to scrotum    OTHER:  Activity: HOB 30deg  PT/OT:  ordered  MNT:  ordered    PLAN:  CTA intra/extra  Possible MRI  Possible NCCU consult    Marquette Old, MD 04/29/2018, 06:31  Resident Physician    Trauma/Surgical Critical Care/Acute Care Surgery Staff    I saw and examined the patient. I reviewed the residents note. I agree with the findings and plan of care as documented in the resident's note. Any exceptions/additions are edited/noted.  Antonio Gillespie is a 58 y.o. old male   AdmissionWeight: 87.5 kg (192 lb 14.4 oz)  CurrentWeight: 90.4 kg (199 lb 4.7 oz)       Neuro: GCS 8T- withdrawing without localization  EEG in process   Known alcoholic. Given thiamine/B12. Scheduled ativan with taper.  Continue multimodal pain medication  CTA intra and extracranial pending  Ammonia 35  Check TSH  Sleep: add melatonin    Resp:   Respiratory Rate: 17  SpO2: 96 %  Recent Labs     04/29/18  0413 04/29/18  0558 04/29/18  0819   FI02 _0 PH 7.47* 7.45 7.47*   PCO2 36.0  38.0 38.0   PO2 117.0* 113.0* 133.0*   BICARBONATE 27.1* 26.8* 28.0*   BASEEXCESS 2.7* 2.4* 3.9*     Conventional settings:  Mode: SPONT  Set VT: 500 mL  Set Rate: 14 Breaths Per Minute  Set PEEP: 5 cmH2O  Pressure Support: 8 cmH2O  FiO2: 30 %  I:E Ratio: 1:1.4   Will likely extubate as following commands . SBT.   CXR 04/29/2018 bil atelectasis. No pneumothorax.  Continue aggressive pulmonary hygiene/IS     CV:  SBP over last 24:  Systolic (14GYJ), EHU:314 , Min:83 , Max:137   pericardial effusion on bedside echo- cardiology consulted. No tamponade physiology  3/14 Echo: report pending  Hemodynamically acceptable. Cont to monitor  Trop low to mid 100's.    GI:  Diet: NPO/OGT   TFs: Start trickles as high risk for refeeding given history of ETOH abuse  Bowel Reg-continue  Last Bowel Movement: (PTA)  GI PPX: pepcid    Recent Labs     04/28/18  1613   ALBUMIN 1.8*     ENDO: For hyperglycemia management: ISS    Renal/FEN:     Intake/Output Summary (Last 24 hours) at 04/29/2018 1117  Last data filed at 04/29/2018 1100  Gross per 24 hour   Intake 4266 ml   Output 720 ml  Net 3546 ml     Recent Labs     04/28/18  1613 04/28/18  1711  04/29/18  0030  04/29/18  0413 04/29/18  0558 04/29/18  0819   SODIUM 137  --   --  140  --   --   --   --    POTASSIUM 3.6  --   --  3.2*  --   --   --   --    CHLORIDE 107  --   --  108  --   --   --   --    BICARBONATE  --   --    < >  --    < > 27.1* 26.8* 28.0*   BUN 3*  3*  --   --  4*  --   --   --   --    CREATININE 0.72  0.72  --   --  0.68  --   --   --   --    GLUCOSE  --  Negative  --   --   --   --   --   --    ANIONGAP 9  --   --  6  --   --   --   --    CALCIUM 7.3*  --   --  6.9*  --   --   --   --    MAGNESIUM 1.7  --   --  1.7  --   --   --   --    PHOSPHORUS 2.5  --   --  2.1*  --   --   --   --     < > = values in this interval not displayed.   CK 286- will not continue serial rechecks  Uo: adeq Monitor Uo,  IVFS: plasmalyte @ 100 cc/h-continue   Lytes-Hypokalemia, replace  K to prevent potential neuromuscular or  cardiac events, ileus, renal abnormalities or  glucose intolerance.  Hypophosphatemia, replace Phos to prevent potential neuologic dysfunction, delayed healing, cardiac events, hematologic dysfunction or mineral metabolism abnormalities.  Borderline Hypomagnesemia, replace Mag to prevent potential neuromuscular dysfunction, cardiac events, calcium metabolism abnormalities and hypokalemia.   Foley-yes. critical I&O    Heme: Likely Chronic Anemia-asx Ensure all blood draws are pedi tubes to min volume taken.  Temperature: 37 C (98.6 F)  Recent Labs     04/28/18  1613 04/29/18  0031 04/29/18  0100 04/29/18  0558   WBC 9.0 5.5 5.3  --    HGB 9.4* 6.8* 6.7* 7.6*   HCT 32.2* 23.0* 23.3* 25.6*   PLTCNT 185 130* 128*  --    INR 1.39*  --   --   --        ID: Temp (12hrs), Avg:37.7 C (99.9 F), Min:36.9 C (98.4 F), Max:38.3 C (100.9 F)   Afebrile without leukocytosis  Abx:   Cefepime, Flagyl and Vanc- need to clarify outside hospital cultures.   3/14 BAL 4+ PMN without organisms  3/14 Urine Cx pending  3/13 NGTD    Skin/Musculoskeletal:   Extremities:  Nasal Fracture- acute on chronic  R prox Humeral fx - ortho consult nonop   Reduced inguinal hernia- nonop currently  Breakdown- none. Diffuse scattered abrasions of varying age  continue local wound care  DVT PPX: SCDs, Lovenox/SQ Heparin  Activity: clarify weight baring status with ortho as note does not indicate  Lines:  femoral A line, Femoral TLC  Disposition: cont ICU  Code Status: full    Family in room at time of visit: no    Critical Care Attestation  Patient with altered mental status requiring frequent neurologic monitoring and data review. MRI pending. Acute hypoxic/hyercarbic Respiratory Failure requiring frequent monitoring and assessment including including aggressive pulmonary hygiene. No suggestion of tamponade physiology. Cont to monitor.   This patient suffers from failure or dysfunction of Neurologic/Sensory,  Respiratory, Cardiovascular, and Musculoskeletal system(s).      I was present at the bedside of this critically ill patient exclusive of procedures.The care of this patient was in regard to managing (a) conditions(s) that has a high probability of sudden, clinically significant, or life-threatening deterioration and required a high degree of Attending Physician attention and direct involvement to intervene urgently.   Data review and care planning was performed in direct proximity of the patient, examination was obviously performed in direct contact with the patient. All of this time was exclusive of procedure which will be documented elsewhere in the chart.    My critical care time is independent and unique to other providers (no other providers saw patient for purposes of SICU evaluation)  My critical care time involved full attention to the patients' condition and included:    Review of nursing notes and/or old charts  Review of medications, allergies, and vital signs  Documentation time  Consultant collaboration on findings and treatment options  Care, transfer of care, and discharge plans  Ordering, interpreting, and reviewing diagnostic studies/tab tests  Obtaining necessary history from family, EMS, nursing home staff and/or treating physicians    My critical care time did not include time spent teaching resident physician(s) or other services of resident physicians, or performing other reported procedures.    Total Critical Care Time: 60 minutes  Electronically Signed by:    Tawana Scale MD, MS  Assistant Professor of Surgery  Trauma, Surgical Critical Care, and [place]  04/29/2018 11:17

## 2018-04-29 NOTE — Care Plan (Signed)
Pt alerting consistently to pain, sometimes to repeated voice or light touch. Intermittently cooperative/able to follow simple commands. Restrained for intermittent agitation. CTA and MRI to be completed to evaluate AMS. Weaning ventilator as able. Bathed and turned q2 for skin. Right arm moved gently, both elevated to reduce edema. Turned q2 for skin. Scrotum elevated and bag balm applied. Tube feeds started for nutrition. FS tested and covered as needed.     Problem: Adult Inpatient Plan of Care  Goal: Plan of Care Review  Outcome: Ongoing (see interventions/notes)  Goal: Patient-Specific Goal (Individualized)  Outcome: Ongoing (see interventions/notes)  Goal: Absence of Hospital-Acquired Illness or Injury  Outcome: Ongoing (see interventions/notes)  Goal: Optimal Comfort and Wellbeing  Outcome: Ongoing (see interventions/notes)  Goal: Rounds/Family Conference  Outcome: Ongoing (see interventions/notes)     Problem: Communication Impairment (Mechanical Ventilation, Invasive)  Goal: Effective Communication  Outcome: Ongoing (see interventions/notes)     Problem: Device-Related Complication Risk (Mechanical Ventilation, Invasive)  Goal: Optimal Device Function  Outcome: Ongoing (see interventions/notes)     Problem: Inability to Wean (Mechanical Ventilation, Invasive)  Goal: Mechanical Ventilation Liberation  Outcome: Ongoing (see interventions/notes)     Problem: Nutrition Impairment (Mechanical Ventilation, Invasive)  Goal: Optimal Nutrition Delivery  Outcome: Ongoing (see interventions/notes)     Problem: Skin and Tissue Injury (Mechanical Ventilation, Invasive)  Goal: Absence of Device-Related Skin and Tissue Injury  Outcome: Ongoing (see interventions/notes)     Problem: Ventilator-Induced Lung Injury (Mechanical Ventilation, Invasive)  Goal: Absence of Ventilator-Induced Lung Injury  Outcome: Ongoing (see interventions/notes)     Problem: Fall Injury Risk  Goal: Absence of Fall and Fall-Related  Injury  Outcome: Ongoing (see interventions/notes)     Problem: Non-violent/Non-Self Destructive Restraints  Goal: Alternative methods tried prior to restraints  Outcome: Ongoing (see interventions/notes)  Goal: Patient free from injury and discomfort  Outcome: Ongoing (see interventions/notes)  Goal: Autonomy maintained at the highest possible level  Outcome: Ongoing (see interventions/notes)  Goal: Need for restraints reassessed per policy  Outcome: Ongoing (see interventions/notes)  Goal: Patient education provided  Outcome: Ongoing (see interventions/notes)  Goal: Problem Interventions  Outcome: Ongoing (see interventions/notes)     Problem: Skin Injury Risk Increased  Goal: Skin Health and Integrity  Outcome: Ongoing (see interventions/notes)

## 2018-04-29 NOTE — Nurses Notes (Signed)
Pt's FS 67. SICU notified. RN to start tube feeds and recheck.

## 2018-04-29 NOTE — Consults (Signed)
INPATIENT NEUROCRITICAL CARE CONSULT NOTE      Antonio Gillespie is a 58 y.o. male currently on the [facility] service for right proximal humerus fracture and nasal bone fracture.  NCCU consulted for encephalopathy.     Date of Admission:  04/28/2018  Admission Source:  Transfer     Subjective:     Consult Requested By: [facility]  Reason for Consultation: Encephalopathy  History Provided By:  history reviewed via medical record     History of Present Illness:   Antonio Gillespie is a 58 yr old male with PMH of A fib, alcohol abuse, and COPD who was found down covered in urine and feces by family on 04/28/18.   Pt was taken to [facility] where workup reveal humerus and nasal bone fracture.  Because of this pt was transferred to [facility] for trauma evaluation.  Pt was planned for extubation however he only follows commands intermittently.  CTA intracranial/ extracranial was obtained which was negative.  EEG has been on and negative for seizures.     Active Hospital Problem List:  Principal Problem:    Fall  Active Problems:    Scalp hematoma    Humeral fracture    UTI (urinary tract infection)    Left inguinal hernia    Anasarca    Hypokalemia    Alcohol abuse    Troponin I above reference range    Acute respiratory failure (CMS HCC)    Nasal fracture    Abrasions of multiple sites       Past Medical History Past Surgical History   A fib, COPD, etoh abuse    Past Surgical History was reviewed and is negative for procedures          Family History Social History - Substance Use   No family history on file. Social History     Tobacco Use   . Smoking status: Not on file   Substance Use Topics   . Alcohol use: Not on file   . Drug use: Not on file      Medical, Surgical, Family, and Social History:  were unable to be obtained secondary to patient medical condition.     Current Inpatient Medications:   Current Facility-Administered Medications   Medication Dose Route Frequency   . acetaminophen (TYLENOL) 160 mg per 5 mL  liquid - grape flavored  650 mg Gastric (NG, OG, PEG, GT) Q4H PRN   . chlorhexidine gluconate (PERIDEX) 0.12% mouthwash  15 mL Swish & Spit 2x/day   . cyanocobalamin (VITAMIN B12) 1000 mcg/mL injection  1,000 mcg Subcutaneous Daily   . electrolyte-A (PLASMALYTE-A) premix infusion   Intravenous Continuous   . enoxaparin PF (LOVENOX) 40 mg/0.4 mL SubQ injection  40 mg Subcutaneous Q12H   . famotidine (PEPCID) tablet  20 mg Gastric (NG, OG, PEG, GT) 2x/day   . HYDROmorphone 2 mg/mL injection ---Cabinet Override       . ipratropium-albuterol 0.5 mg-3 mg(2.5 mg base)/3 mL Solution for Nebulization  3 mL Nebulization Q6H   . lanolin-oxyquin-pet, hydrophil (BAG BALM) topical ointment   Apply Topically 2x/day PRN   . LORazepam (ATIVAN) 2 mg/mL injection  2 mg Intravenous Q8HR    Followed by   . [START ON 04/30/2018] LORazepam (ATIVAN) 2 mg/mL injection  1 mg Intravenous Q8HR    Followed by   . [START ON 05/02/2018] LORazepam (ATIVAN) 2 mg/mL injection  0.5 mg Intravenous Q8HR   . magnesium sulfate 4 G in  SW 100 mL premix IVPB  4 g Intravenous Now   . nitrofurantoin (FURADANTIN) 25mg  per 41mL oral liquid  50 mg Gastric (NG, OG, PEG, GT) 4x/day   . NS flush syringe  2 mL Intracatheter Q8HRS    And   . NS flush syringe  2-6 mL Intracatheter Q1 MIN PRN   . perflutren lipid microspheres (DEFINITY) 1.3 mL in NS 10 mL (tot vol) injection  2 mL Intravenous Give in Cardiology   . senna concentrate (SENNA) 528mg  per 54mL oral liquid  5 mL Gastric (NG, OG, PEG, GT) 2x/day   . SSIP insulin R human (HUMULIN R) 100 units/mL injection  0-12 Units Subcutaneous Q4H PRN   . [START ON 04/30/2018] thiamine (VITAMIN B1) 250 mg in NS 50 mL IVPB  250 mg Intravenous Q24H   . thiamine (VITAMIN B1) 500 mg in NS 50 mL IVPB  500 mg Intravenous 3x/day   . [START ON 05/04/2018] thiamine-vitamin B1 (BETAXIN) tablet  50 mg Gastric (NG, OG, PEG, GT) 2x/day        Allergies:  No Known Allergies     Review of Systems:  A complete review of systems was unable to  be obtained due to patient condition and unable to reach family.       Objective:     Most Recent Vital Signs:  Vital Signs Range Most Recent   Temperature Temp  Avg: 37.4 C (99.3 F)  Min: 36.7 C (98.1 F)  Max: 38.3 C (100.9 F) Temperature: 36.7 C (98.1 F)   Heart Rate Pulse  Avg: 115.8  Min: 96  Max: 129 Heart Rate: 96   Blood Pressure BP  Min: 83/73  Max: 137/117 BP (Non-Invasive): 116/79   Respiration Resp  Avg: 17.4  Min: 10  Max: 32 Respiratory Rate: (!) 26   SpO2 SpO2  Avg: 97.3 %  Min: 94 %  Max: 100 % SpO2: 95 %     Physical Exam:      Gen: Moderate distress     HENT:  Traumatic cranium, with bruising.  Sclera anicteric.  No oropharyngeal lesions.     Ophthalmic:  Pupils 22mm and fixed     Neck:  C collar in place      CV: Regular rate and rhythm.  No murmurs.     Pulmonary: Intubated on ventilatory support.  Clear to auscultation bilaterally without wheezes or rales.     Psych: Depressed due to medical condition.     Neurological Exam:      Mental Status Exam:     Level of Consciousness:  GCS 2/5/1T   Memory:  unable to assess due to intubation   Attention:  Does not track examiner, responds only to noxious stimuli.   Knowledge:  unable to assess due to medical condition   Language:  Unable to assess due to medical condition   Speech: Unable to assess due to medical condition.     Cranial Nerves:    CN2:  UTA visual fields   CN3/4/6:  gaze midline   CN5:  UTA facial sensation   CN7:  Facial activation symmetric.   CN8:  Hearing intact to finger rub bilaterally.   CN9/10:  Palate elevates symmetrically.   CN11:  shoulder shrug symmetric   CN12:  Tongue protrudes midline.     Musculoskeletal:    Bulk/Tone/Tremor:  Normal muscle bulk and tone throughout. No tremor is appreciated.  Pronator Drift:  There is no pronator drift or satelliting.  Strength:   BUE localize, BLE move spontaneously      Sensation: Grimaces to noxious stimuli.     Coordination:  Unable to assess due to patient interaction.      Gait:  Unable to assess due to patient condition.     Pertinent Labs:  Complete Blood Count  Recent Labs     04/29/18  0100 04/29/18  0558   WBC 5.3  --    HGB 6.7* 7.6*   PLTCNT 128*  --     Metabolic Panel  Recent Labs     04/29/18  0030   SODIUM 140   POTASSIUM 3.2*   CHLORIDE 108   CO2 26   BUN 4*   CREATININE 0.68   CALCIUM 6.9*   MAGNESIUM 1.7   PHOSPHORUS 2.1*      Liver Function Test  Recent Labs     04/28/18  1613 04/28/18  2251   ALT 33  --    AST 55*  --    TOTBILIRUBIN 1.6*  --    ALBUMIN 1.8*  --    ALKPHOS 238*  --    AMMONIA  --  35    Miscellaneous Labs  Recent Labs     04/29/18  1149   TROPONINI 82*   TSH 0.421         Pertinent Imaging Studies:  Results for orders placed or performed during the hospital encounter of 04/28/18 (from the past 24 hour(s))   XR AP MOBILE CHEST     Status: None    Narrative    Antonio Gillespie  Male, 58 years old.    XR AP MOBILE CHEST performed on 04/28/2018 5:21 PM.    REASON FOR EXAM:  respiratory failure    TECHNIQUE: 1 view/1 image(s) submitted for interpretation.    COMPARISON CHEST RADIOGRAPH: 04/28/2018 performed at 1200 hours on outside  facility.    FINDINGS:  The heart is enlarged. No focal consolidation, pleural effusion, or  pneumothorax is seen. Support devices appear to be in appropriate   position.  Medially displaced right surgical neck fracture is identified.      Impression    Medial displaced right surgical neck fracture and stable cardiomegaly  without acute cardiopulmonary process.     XR AP MOBILE CHEST     Status: None    Narrative    Antonio Gillespie  Male, 58 years old.    XR AP MOBILE CHEST performed on 04/29/2018 5:35 AM.    REASON FOR EXAM:  Follow up CXR trauma pt    TECHNIQUE: 1 view/2 image(s) submitted for interpretation.    COMPARISON CHEST RADIOGRAPH: 04/28/2018.    FINDINGS:  There is bibasilar atelectasis. No pneumothorax is seen. The heart is  enlarged. Previously noted right shoulder fracture is partially   visualized.  Support devices are  stable position.      Impression    Stable cardiomegaly with bibasilar atelectasis.     CT ANGIO INTRACRANIAL W/WO IV CONTRAST     Status: None    Narrative    Antonio Gillespie  Male, 58 years old.    CT ANGIO INTRACRANIAL W/WO IV CONTRAST performed on 04/29/2018 10:32 AM.    REASON FOR EXAM:  unexplained altered mental status  RADIATION DOSE: 3112.70 mGycm  CONTRAST: 50 ml's of Isovue 370    TECHNIQUE: Standard noncontrast head CT was performed with multiplanar  reformatted imaging. CT angiography was then performed of the intracranial  arterial vasculature.  Multiplanar plus/minus subtracted CT angiographic  reformatted imaging was performed.    Bolus Quality :Adequate    COMPARISON: CTA extracranial performed the same time; outside PET/CT dated  04/27/2018    FINDINGS:     NON-CONTRAST HEAD CT  Evaluation is markedly limited secondary to patient cooperation and  extensive overlying EEG leads. No large masses or midline shift. No large  amount of hemorrhage. Ventricles are normal configuration and congruent  with overlying sulcal space. Mild generalized volume loss is seen. No large  focal areas of tissue loss are seen. Evaluation subtle loss of gray-white  is nondiagnostic. Right lens implant is seen. Mucosal thickening seen  within left maxillary sinus. Scattered mucosal opacifications seen within  the anterior ethmoid air cells. Mucosal thickening also seen within the  sphenoid sinuses. Mucosal thickening and/or mucus retention cyst seen  within the frontal sinuses. Skull and scalp is unremarkable.  Redemonstration of some decreased attenuation within the centrum semiovale  on the right. This may reflect small vessel ischemic changes, but overall  this is nonspecific. No mass effect is appreciated.    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    INTRACRANIAL CTA    RIGHT Anterior Circulation    The distal cervical internal carotid artery is widely patent. The  petrous  internal carotid artery is widely patent. The cavernous internal carotid  artery is patent. No aneurysms are appreciated. The supraclinoid internal  carotid artery is normal in appearance. The origin of the ophthalmic artery  is unremarkable in appearance. No abnormalities involving the origins of  the posterior communicating artery are present.     The right middle cerebral artery is patent. No evident bifurcation  aneurysms are appreciated. The proximal M2 segments are patent. The origin  of the anterior cerebral artery is patent. The A1 segment is widely patent.  No abnormalities of the anterior communicating artery complex are  demonstrated. The more distal portions of the anterior cerebral artery are  without definitive abnormalities.    LEFT Anterior Circulation    The distal cervical internal carotid artery is widely patent. The petrous  internal carotid artery is widely patent. The cavernous internal carotid  artery is patent. No aneurysms are appreciated. The supraclinoid internal  carotid artery is normal in appearance. The origin of the ophthalmic artery  is unremarkable in appearance. No abnormalities involving the origins of  the posterior communicating artery are present.     The left middle cerebral artery is patent. No evident bifurcation aneurysms  are appreciated. The proximal M2 segments are patent. The origin of the  anterior cerebral artery is patent. The A1 segment is widely patent. No  abnormalities of the anterior communicating artery complex are  demonstrated. The more distal portions of the anterior cerebral artery are  without definitive abnormalities.    VERTEBROBASILAR System    The intradural vertebral arteries are codominant in size. The intradural  vertebral arteries demonstrate are patent without significant stenoses. The  basilar artery is patent.    OTHER VASCULAR FINDINGS:  Minimal scattered atherosclerotic disease is seen throughout. No vascular  malformation is  identified. Limited evaluation of dural sinuses is  unremarkable.       Impression    1.  Markedly limited exam secondary to overlying EEG leads and patient  cooperation. Recommend repeat examination with leads removed and/or further  evaluation with magnetic resonance imaging if concerns persist. Otherwise  no large hemorrhage, mass effect or midline  shift is identified.  2.  Minimal scattered atherosclerotic disease without any definite filling  defect, high-grade stenosis or LVO identified.     CT ANGIO CAROTID-EXTRACRANIAL (NECK) W IV CONTRAST     Status: None    Narrative    Antonio Gillespie  Male, 58 years old.    CT ANGIO CAROTID-EXTRACRANIAL (NECK) W IV CONTRAST performed on 04/29/2018  10:59 AM.    REASON FOR EXAM:  unexplainted AMS  RADIATION DOSE: 272.80 mGycm  CONTRAST: 50 ml's of Isovue 370    TECHNIQUE: Standard CT angiographic acquisition was performed from the  aortic arch through the skull base. Multiplane are reformatted  reconstructions were performed.    Bolus Quality :Adequate    COMPARISON: CTA intracranial performed the same time.    FINDINGS:   Aortic Arch / Juanetta BeetsGreat Vessel Origins: There is a right-sided aortic arch with  aberrant origin of the left subclavian artery from a diverticulum of  Kommerell. Minimal atherosclerotic disease seen within. Origins the great  vessels are widely patent. No central pulmonary embolus. Prominence of the  central pulmonary arteries are seen which can be seen with pulmonary artery  hypertension.    VERTEBROBASILAR SYSTEM    Vertebral Artery Dominance:The right vertebral artery is dominant with  respect to size within the neck.    Left Vertebral Artery:  The left vertebral artery origin arises from the left common carotid artery  and is widely patent. Left vertebral artery distal to the origin  demonstrates normal course and caliber throughout the neck and it's  intradural portion.    Right Vertebral Artery:  The right vertebral artery arises from its respective  subclavian artery  with suggestion of mild stenosis at its origin but evaluation is mildly  limited secondary to technique. Distally within the high V1 segment and  proximal V2 segment this artery is not well opacified. Distal V2 segments  are well opacified.    CAROTID SYSTEM    RIGHT Common Carotid Artery: The origin of the right common carotid artery  is patent. There is normal course and caliber of the common carotid artery  to the level of the bifurcation.    RIGHT Carotid Bifurcation/ICA:Right carotid bifurcation is widely patent.   The right cervical internal carotid artery is patent. No evident stenoses  are appreciated.    LEFT CAROTID SYSTEM    LEFT Common Carotid Artery:The origin of the left common carotid artery is  patent. There is normal course and caliber of the left common carotid  artery to the level of the bifurcation.    LEFT Carotid Bifurcation/ICA: The left carotid bifurcation is unremarkable  in appearance.   The left cervical internal carotid artery is patent. No evident stenoses  are appreciated.    Non-Findings: No gross cervical malalignment. Partial redemonstration of  prominent focal dextroscoliosis of upper thoracic spine secondary to  right-sided hemivertebra that appears between the T3 and T4 levels better  seen on prior chest CT dated 04/27/2018. Enteric tube is seen within the  esophagus. Patient is intubated. Minimal atelectasis. A few nonspecific  groundglass opacities are noted. Motion and technique limits evaluation of  lung is better characterized on prior CT chest performed 04/27/2018.      Impression    1.  Right-sided aortic arch with aberrant left subclavian artery.  2.  Suggestion of mild stenosis at the origin of right vertebral artery  along with limited opacification seen within distal right vertebral artery  V1 and proximal V2 segments. These  findings may be exacerbated by technique  and patient's underlying anatomy. Otherwise superior segments of bilateral  vertebral  arteries are well-opacified. Visualized vertebrobasilar system is  mildly diminutive.  3.  Otherwise mild scattered atherosclerotic disease without other evidence  of high-grade stenosis or filling defect identified.  4.  Dilated main pulmonary artery which can be seen with pulmonary artery  hypertension.             Assessment:     Active Hospital Problems    Diagnosis   . Primary Problem: Fall   . Hypokalemia   . Alcohol abuse   . Troponin I above reference range   . Acute respiratory failure (CMS HCC)   . Nasal fracture   . Abrasions of multiple sites   . Scalp hematoma   . Humeral fracture   . UTI (urinary tract infection)   . Left inguinal hernia   . Anasarca          Antonio Gillespie is a 58 y.o. male admitted to the [facility] for humerus and nasal bone fractures.  The Neurocritical Care Service was consulted for encephalopathy and inability to wean from ventilator.           DNR Status this admission:  Full Code  Palliative/Supportive Care consulted?  no  Hospice Consulted?  Not applicable    Current Comorbid Conditions - Neurology Consult  Compression of the Brain:  no, Cerebral Edema:  no, Benign Intracranial Hypertension:  no, Obstructive Hydrocephalus:  no and Anoxic Brain Damage:  no  Coma (GCS less than 8):Coma -Not applicable  TIA not applicable  Encephalopathy:  yes  Encephalitis-Not applicable  Seizure-Not applicable   ARDS  Vent on admission 3/13(date)  No  Coagulopathy Not applicable  N/A    DVT/PE Prophylaxis: Enoxaparin     Recommendations:   # Encephalopathy  - Q1H neuro checks, Q1H pupillometry   - STAT CT brain for decline in neuro exam   - Differentials for encephalopathy are wide and include: alcohol detox, wernicke's encephalopathy, infectious, metabolic  - encephalopathy labs: TSH wdl, ammonia wdl, no hypercarbia, RPR ordered   - continue vEEG, can DC if negative at 24 hrs  - agree with thiamine supplementation  - please obtain MRI brain w/wo   - Case discussed with Dr. Mare Loan  - NCCU  will follow, please call [phone] with questions            Antonio Rankins, PA-C 04/29/2018   NeuroCritical Care Unit      Attending Attestation:     I personally saw and evaluated the patient. See mid-level's note for additional details. My findings/participation are:    This is a 58 year old male admitted to trauma service after being found down covered in urine and feces by family.  Has a past medical history of AFib, alcohol abuse, and COPD.  Dilated and sluggish pupils which appear surgical (possible pseudophakia as slightly elliptical).  Arouses to noxious stimuli and moving all extremities tremulously, but not following commands or clearly localizing.    MRI demonstrates moderate-to-severe volume loss and remote microhemorhages.  EEG suppressed but no epileptiform discharges.  Lumbar puncture may be considered for Cell Count, Differential, Protein, Glucose, and HSV titer; but is felt to be low yield given exam and lack of any other indicating features on MRI or EEG.  No obvious contributing metabolic factors on review of labs (normal ammonia, BUN/Creat, TSH, electrolytes - except hypokalemia).  Patient receiving thiamine to prevent Wernicke's.  Encephalopathy likely related to Delirium Tremons, medication effects (ativan, past doses of Cefepime, Fentanyl), and recent head injury superimposed on a suspected poor baseline as evidenced by brain atrophy disproportionate to age -- which may be reflective of either an underlying dementia or years of alcohol abuse.  Recommend continued monitoring as ativan requirements diminish and reassess when off Ativan and outside usual timecourse for DTs.    Antonio Bounds, MD  Neurocritical Care Attending  04/30/2018  13:05

## 2018-04-29 NOTE — Nurses Notes (Signed)
Restraint Continuation      Patient continues to have the following condition confusion leading to attempts at reaching for lines and airway.       Least restrictive alternatives attempted : Increase patient observation, concealed tubes/lines and frequent orientation    Patient continues to exhibit the following behaviors: agitated    The restraint continued to facilitate medical/surgical treatment to ensure safety.    The patient will continue to be evaluated and assessments documented on the flowsheet to ensure that the patient is released from the restraint at the earliest possible time

## 2018-04-29 NOTE — Ancillary Notes (Signed)
[facility] [employer]  MRI Technologist Note        MRI has been completed.        Janalee Dane, RaLPh [facility] 04/29/2018, 23:47

## 2018-04-29 NOTE — Pharmacy Vancomycin Dosing (Signed)
[facility] / Department of Pharmaceutical Services  Therapeutic Drug Monitoring: Vancomycin  04/29/2018      Patient name: Antonio Gillespie, Antonio Gillespie.  Date of Birth:  1960-02-28    Actual Weight:  Weight: 90.4 kg (199 lb 4.7 oz) (04/29/18 0000)     BMI:  BMI (Calculated): 30.27 (04/29/18 0000)    Date RPh Current regimen (including mg/kg) Indication Target Levels (mcg/mL) SCr (mg/dL) CrCl* (mL/min) Measured level (mcg/mL) Plan (including when levels are due) Comments   3/13 [facility] Vancomycin 1000 mg q12h x2 doses (last dose 3/13 at 1354) Bloodstream infection 10-15 0.72 approx 100  Vancomycin 1250 mg (15 mg/kg) q12h. Plan for level if continued >72h +cefepime   3/15 [facility] Vanc D/C'd                                                                        *Creatinine clearance is estimated by using the Cockcroft-Gault equation for adult patients and the Brendolyn Patty for pediatric patients.    The decision to discontinue vancomycin therapy will be determined by the primary service.  Please contact the pharmacist with any questions regarding this patient's medication regimen.

## 2018-04-29 NOTE — Progress Notes (Signed)
[facility]                                                      Trauma Progress Note                 Date of Birth:  28-Apr-1960  Date of Admission:  04/28/2018  Date of service: 04/29/2018    Antonio Gillespie, 58 y.o., male Post trauma day 1 status post Fall      Events over the last 24 hours have included:  Admitted to SICU  EEG started  Left inguinal hernia reduced at bedside    Subjective:    Patient remains intubated. Not following commands this morning, but per report intermittently following commands throughout the night.     Objective   24 Hour Summary:    Filed Vitals:    04/29/18 0630 04/29/18 0700 04/29/18 0720 04/29/18 0800   BP: 109/68 114/77  109/76   Pulse:  (!) 115  (!) 102   Resp:  13  16   Temp:    37 C (98.6 F)   SpO2:  97% 98% 98%     Labs:  Recent Labs     04/28/18  1613 04/28/18  1711  04/29/18  0030 04/29/18  0031 04/29/18  0100 04/29/18  0413 04/29/18  0558 04/29/18  0819   WBC 9.0  --   --   --  5.5 5.3  --   --   --    HGB 9.4*  --   --   --  6.8* 6.7*  --  7.6*  --    HCT 32.2*  --   --   --  23.0* 23.3*  --  25.6*  --    SODIUM 137  --   --  140  --   --   --   --   --    POTASSIUM 3.6  --   --  3.2*  --   --   --   --   --    CHLORIDE 107  --   --  108  --   --   --   --   --    BICARBONATE  --   --    < >  --  26.9*  --  27.1* 26.8* 28.0*   BUN 3*  3*  --   --  4*  --   --   --   --   --    CREATININE 0.72  0.72  --   --  0.68  --   --   --   --   --    GLUCOSE  --  Negative  --   --   --   --   --   --   --    ANIONGAP 9  --   --  6  --   --   --   --   --    CALCIUM 7.3*  --   --  6.9*  --   --   --   --   --    MAGNESIUM 1.7  --   --  1.7  --   --   --   --   --    PHOSPHORUS 2.5  --   --  2.1*  --   --   --   --   --    INR 1.39*  --   --   --   --   --   --   --   --     < > = values in this interval not displayed.       Intake/Output Summary (Last 24 hours) at 04/29/2018 0947  Last data filed at 04/29/2018 0900  Gross per 24 hour   Intake 4066 ml   Output 720 ml   Net 3346  ml     Nutrition Management: MNT PROTOCOL FOR DIETITIAN Last Bowel Movement: (PTA)  Recent Labs     04/28/18  1613   ALBUMIN 1.8*     Current Medications:    Current Facility-Administered Medications:   .  acetaminophen (TYLENOL) 160 mg per 5 mL liquid - grape flavored, 650 mg, Gastric (NG, OG, PEG, GT), Q4H PRN, Thimm, Terra, MD, 650 mg at 04/29/18 0316  .  cefepime (MAXIPIME) 2 g in NS 100 mL IVPB, 2 g, Intravenous, Q8H, Chung, Hansol, MD, Last Rate: 33 mL/hr at 04/29/18 0826, 2 g at 04/29/18 0826  .  chlorhexidine gluconate (PERIDEX) 0.12% mouthwash, 15 mL, Swish & Spit, 2x/day, Kasandra Knudsen, Victorino Dike, MD, 15 mL at 04/29/18 0826  .  cyanocobalamin (VITAMIN B12) 1000 mcg/mL injection, 1,000 mcg, Subcutaneous, Daily, Michaelle Birks, MD, 1,000 mcg at 04/29/18 0828  .  electrolyte-A (PLASMALYTE-A) premix infusion, , Intravenous, Continuous, McCoy, Skyler, MD, Last Rate: 100 mL/hr at 04/29/18 0249  .  enoxaparin PF (LOVENOX) 40 mg/0.4 mL SubQ injection, 40 mg, Subcutaneous, Q12H, Donetta Potts, MD, 40 mg at 04/29/18 0827  .  famotidine (PEPCID) tablet, 20 mg, Gastric (NG, OG, PEG, GT), 2x/day, Kasandra Knudsen, Victorino Dike, MD, 20 mg at 04/29/18 0826  .  HYDROmorphone 2 mg/mL injection ---Cabinet Override, , , ,   .  LORazepam (ATIVAN) 2 mg/mL injection, 2 mg, Intravenous, Q8HR, 2 mg at 04/29/18 0600 **FOLLOWED BY** [START ON 04/30/2018] LORazepam (ATIVAN) 2 mg/mL injection, 1 mg, Intravenous, Q8HR **FOLLOWED BY** [START ON 05/02/2018] LORazepam (ATIVAN) 2 mg/mL injection, 0.5 mg, Intravenous, Q8HR, Thimm, Terra, MD  .  metroNIDAZOLE (FLAGYL) tablet, 500 mg, Gastric (NG, OG, PEG, GT), Q8H, Kasandra Knudsen, Victorino Dike, MD  .  INSERT & MAINTAIN PERIPHERAL IV ACCESS, , , UNTIL DISCONTINUED **AND** DRESSING CHANGE Saline Lock; DRY STERILE, , , PRN **AND** NS flush syringe, 2 mL, Intracatheter, Q8HRS, 2 mL at 04/29/18 0600 **AND** NS flush syringe, 2-6 mL, Intracatheter, Q1 MIN PRN, Dory Peru, Hansol, MD  .  perflutren lipid microspheres  (DEFINITY) 1.3 mL in NS 10 mL (tot vol) injection, 2 mL, Intravenous, Give in Cardiology, Thimm, Babs Sciara, MD  .  senna concentrate (SENNA) 528mg  per 40mL oral liquid, 5 mL, Gastric (NG, OG, PEG, GT), 2x/day, Kasandra Knudsen, Victorino Dike, MD, 176 mg at 04/29/18 0826  .  SSIP insulin R human (HUMULIN R) 100 units/mL injection, 0-12 Units, Subcutaneous, Q4H PRN **AND** POCT WHOLE BLOOD GLUCOSE, , , Q4H, Thimm, Terra, MD  .  Melene Muller ON 04/30/2018] thiamine (VITAMIN B1) 250 mg in NS 50 mL IVPB, 250 mg, Intravenous, Q24H, Thimm, Terra, MD  .  thiamine (VITAMIN B1) 500 mg in NS 50 mL IVPB, 500 mg, Intravenous, 3x/day, Thimm, Terra, MD, Last Rate: 110 mL/hr at 04/29/18 0827, 500 mg at 04/29/18 0827  .  [START ON 05/04/2018] thiamine-vitamin B1 (BETAXIN) tablet, 50 mg, Gastric (NG, OG, PEG, GT), 2x/day, Thimm, Terra, MD  .  vancomycin (VANCOCIN) 1,250 mg in NS 250 mL IVPB, 15 mg/kg (Adjusted), Intravenous, Q12H, Donetta Potts, MD, Stopped at 04/29/18 0450  .  Vancomycin IV - Pharmacist to Dose per Protocol, , Does not apply, Daily PRN, Donetta Potts, MD     Today's Physical Exam:  GEN:   Chronically ill appearing  HEENT:   Pupils equal but not reactive. EEG leads in place  PULM:   Mechanically ventilated  CV:   Tachycardic  ABD:   Soft, nondistended. Left inguinal hernia reduced at bedside, patient seemed to have some discomfort with reduction.   NEURO:   GCS 3T, no response to sternal rub or nail bed pressure. Extremities noted to be twitching   Integumentary:  Feet with scatter wounds, dry in nature.    Assessment/ Plan:   Active Hospital Problems   (*Primary Problem)    Diagnosis   . *Fall   . Hypokalemia   . Alcohol abuse   . Scalp hematoma   . Humeral fracture   . UTI (urinary tract infection)   . Left inguinal hernia   . Anasarca     DVT prophylaxis:  Enoxaparin and SCDs/ Venodynes/Impulse boots  Anticoagulants (last 24 hours)     Date/Time Action Medication Dose    04/29/18 0827 Given    enoxaparin PF (LOVENOX) 40 mg/0.4 mL SubQ  injection 40 mg    04/28/18 2020 Given    enoxaparin PF (LOVENOX) 40 mg/0.4 mL SubQ injection 40 mg        Nutrition: MNT PROTOCOL FOR DIETITIAN diet, Last Bowel Movement: (PTA)  Activity: No limitations    Pain:   Analgesics (last 24 hours)     Date/Time Action Medication Dose    04/29/18 0316 Given    acetaminophen (TYLENOL) 160 mg per 5 mL liquid - grape flavored 650 mg    04/28/18 1720 Given    fentaNYL (SUBLIMAZE) 50 mcg/mL injection 100 mcg    04/28/18 1648 Given    fentaNYL (SUBLIMAZE) 50 mcg/mL injection 50 mcg    04/28/18 1633 Given    fentaNYL (SUBLIMAZE) 50 mcg/mL injection 100 mcg        PT Recommendations:  Pending    Plan:     Poor neurological exam   - EEG in place   - NCCU consult   - CTA intra/extracranial    Left inguinal hernia   - Reducible at bedside   - Continue to monitor for overlying skin changes   - Will attempt daily reduction    Right humeral neck fracture   - Ortho consulted   - No operative intervention at this time   - Sling as needed    Nasal bone fracture   - ENT consult    UA with moderate leuks, culture pending  Start tube feeds if unable to extubate  Continue DVT prophylaxis  Wean vent as able    Gerhard Munch, MD      Late entry for 04/29/18. I saw and examined the patient.  I reviewed the resident's note.  I agree with the findings and plan of care as documented in the resident's note.  Any exceptions/additions are edited/noted.    Mallie Mussel, MD

## 2018-04-29 NOTE — Care Management Notes (Signed)
[facility]  Care Management Note    Patient Name: Antonio Gillespie. Christell Constant  Date of Birth: February 20, 1960  Sex: male  Date/Time of Admission: 04/28/2018  3:32 PM  Room/Bed: 04/A  Payor: /    LOS: 1 day   No primary care provider on file.    Admitting Diagnosis:  Scalp hematoma [S00.03XA]    Assessment:      04/29/18 1659   ADVANCE DIRECTIVES   Does the Patient have an Advance Directive? Yes, Patient Does Have Advance Directive for Healthcare Treatment   Document the Substance of the Advance Directive (Required) HCS   Type of Advance Directive Completed 1   Copy of Advance Directives in Chart? 0   Name of MPOA or Healthcare Surrogate Carvis Mankiewicz   Phone Number of MPOA or Healthcare Surrogate [phone]   Patient Requests Assistance in Having Advance Directive Notarized. N/A     MSW notified by bedside RN that pt is intubated and sedated and in need of a HCS.  MSW called pt's brother Diyor Mcleish and asked if pt had ever appointed an MPOA.  According to [place], he did not think he had.  MSW explained HCS to [place].  [name] said pt would probably be fine with either him or his cousin Corrie Dandy being appointed.  MSW called Corrie Dandy, she said she would be fine with [name] being appointed.  MSW called pt's other brother, Lurene Shadow, unable to contact.  MSW called [name] back, he accepted appointment as HCS.  Notified service, signed HCS placed on pt chart.          Case Manager: Annabell Howells, SOCIAL WORKER  Phone: [phone]

## 2018-04-29 NOTE — Nurses Notes (Signed)
0800 pt not following, SICU aware, not following for SICU as well  1200 Pt following after multiple prompts, SICU @ bedside to witness  1400 Pt being accessed for extubation, no longer following, SICU notified, not following for them either, RN advocated for delaying extubation, SICU agreeable, will cont further work up and start tube feeds

## 2018-04-29 NOTE — Procedures (Signed)
NAME:  Antonio Gillespie, Antonio Gillespie  HOSPITAL NUMBER:  [id number]  DATE OF SERVICE:  04/28/2018  DOB:  1960/09/17  SEX:  M      Date of Study:  April 28, 2018, start time 21:59:20, end time April 29, 2018, 21:59    EEG#: [id number].  Technician:  PD.    REQUESTING PHYSICIAN:  Mallie Mussel, MD.    HISTORY:  The patient is a 58 year old man with history of alcoholism who was found down.  EEG is requested to evaluate for subclinical seizures.    MEDICATIONS:  Lorazepam as needed.    REPORT:  This is a digitally acquired video EEG performed with the standard 10-20 system of electrode placement.     Awake background was characterized by diffuse low-voltage beta activity.  No clear posterior dominant rhythm was seen.     Background was continuous and symmetric.     State change was seen.  No clear sleep architecture was seen.     Hyperventilation and photic stimulation were not performed.     No clinical events were marked.    ABNORMAL ACTIVITY:  No seizures or epileptiform discharges were seen.    INTERPRETATION:  1. Low-voltage record with diffuse attenuation.  2. No seizures or epileptiform discharges were seen.        Melynda Ripple MD  Assistant Professor, Neurology  [facility]            CC:   Mallie Mussel, MD   [facility] [address]   [address]   [place], [place] [address]       DD:  04/29/2018 12:36:12  DT:  04/29/2018 13:40:55 TM  D#:  [id number]

## 2018-04-29 NOTE — Care Plan (Signed)
Patient not following commands at this time but in no apparent respiratory distress.  His current vent settings are PSV -  8/+5 and 30%.  Will continue to follow and monitor patient's respiratory status weaning as tolerated.

## 2018-04-29 NOTE — Nurses Notes (Signed)
TRB Service at bedside and attempting to reduce hernia. Asked RN to pull and draw up dilaudid. After pulled and drawn, service already reduced hernia and medication no longer necessary. Full vial wasted per policy with witness.

## 2018-04-30 ENCOUNTER — Inpatient Hospital Stay (HOSPITAL_COMMUNITY): Payer: Medicaid Other

## 2018-04-30 DIAGNOSIS — G9340 Encephalopathy, unspecified: Secondary | ICD-10-CM

## 2018-04-30 DIAGNOSIS — I517 Cardiomegaly: Secondary | ICD-10-CM

## 2018-04-30 DIAGNOSIS — J9811 Atelectasis: Secondary | ICD-10-CM

## 2018-04-30 DIAGNOSIS — N390 Urinary tract infection, site not specified: Secondary | ICD-10-CM

## 2018-04-30 DIAGNOSIS — D696 Thrombocytopenia, unspecified: Secondary | ICD-10-CM | POA: Diagnosis present

## 2018-04-30 DIAGNOSIS — J9601 Acute respiratory failure with hypoxia: Secondary | ICD-10-CM

## 2018-04-30 DIAGNOSIS — J9602 Acute respiratory failure with hypercapnia: Secondary | ICD-10-CM

## 2018-04-30 DIAGNOSIS — S022XXA Fracture of nasal bones, initial encounter for closed fracture: Secondary | ICD-10-CM

## 2018-04-30 LAB — CBC WITH DIFF
HCT: 25.5 % — ABNORMAL LOW (ref 38.9–52.0)
HGB: 7.6 g/dL — ABNORMAL LOW (ref 13.4–17.5)
MCH: 26.3 pg (ref 26.0–32.0)
MCHC: 29.8 g/dL — ABNORMAL LOW (ref 31.0–35.5)
MCV: 88.2 fL (ref 78.0–100.0)
PLATELETS: 94 10*3/uL — ABNORMAL LOW (ref 150–400)
RBC: 2.89 10*6/uL — ABNORMAL LOW (ref 4.50–6.10)
RDW-CV: 22.6 % — ABNORMAL HIGH (ref 11.5–15.5)
WBC: 3.6 10*3/uL — ABNORMAL LOW (ref 3.7–11.0)

## 2018-04-30 LAB — POC BLOOD GLUCOSE (RESULTS)
GLUCOSE, POC: 89 mg/dL (ref 70–105)
GLUCOSE, POC: 106 mg/dL — ABNORMAL HIGH (ref 70–105)
GLUCOSE, POC: 128 mg/dL — ABNORMAL HIGH (ref 70–105)

## 2018-04-30 LAB — MANUAL DIFF AND MORPHOLOGY-SYSMEX
BASOPHIL #: <0.1 10*3/uL (ref <=0.20)
BASOPHIL %: 0 %
EOSINOPHIL #: <0.1 10*3/uL (ref <=0.50)
EOSINOPHIL %: 0 %
LYMPHOCYTE #: 0.29 10*3/uL — ABNORMAL LOW (ref 1.00–4.80)
LYMPHOCYTE %: 8 %
MONOCYTE #: <0.1 10*3/uL — ABNORMAL LOW (ref 0.20–1.10)
MONOCYTE %: 2 %
NEUTROPHIL #: 3.24 10*3/uL (ref 1.50–7.70)
NEUTROPHIL %: 88 %
NEUTROPHIL BANDS %: 2 %
NRBC FROM MANUAL DIFF: 0 per 100 WBC
NRBC FROM MANUAL DIFF: 0 per 100 WBC
RBC MORPHOLOGY: NORMAL

## 2018-04-30 LAB — BASIC METABOLIC PANEL
ANION GAP: 6 mmol/L (ref 4–13)
ANION GAP: 5 mmol/L (ref 4–13)
BUN/CREA RATIO: 7 (ref 6–22)
BUN/CREA RATIO: 8 (ref 6–22)
BUN: 4 mg/dL — ABNORMAL LOW (ref 8–25)
BUN: 4 mg/dL — ABNORMAL LOW (ref 8–25)
CALCIUM: 7.1 mg/dL — ABNORMAL LOW (ref 8.5–10.2)
CALCIUM: 7 mg/dL — ABNORMAL LOW (ref 8.5–10.2)
CHLORIDE: 107 mmol/L (ref 96–111)
CHLORIDE: 108 mmol/L (ref 96–111)
CO2 TOTAL: 28 mmol/L (ref 22–32)
CO2 TOTAL: 28 mmol/L (ref 22–32)
CREATININE: 0.57 mg/dL — ABNORMAL LOW (ref 0.62–1.27)
CREATININE: 0.52 mg/dL — ABNORMAL LOW (ref 0.62–1.27)
ESTIMATED GFR: >60 mL/min/1.73mˆ2 (ref >60)
ESTIMATED GFR: >60 mL/min/1.73mˆ2 (ref >60)
GLUCOSE: 87 mg/dL (ref 65–139)
GLUCOSE: 125 mg/dL (ref 65–139)
POTASSIUM: 2.7 mmol/L — CL (ref 3.5–5.1)
POTASSIUM: 3.3 mmol/L — ABNORMAL LOW (ref 3.5–5.1)
SODIUM: 141 mmol/L (ref 136–145)
SODIUM: 141 mmol/L (ref 136–145)

## 2018-04-30 LAB — PHOSPHORUS
PHOSPHORUS: 3.1 mg/dL (ref 2.4–4.7)
PHOSPHORUS: 2.7 mg/dL (ref 2.4–4.7)

## 2018-04-30 LAB — ARTERIAL BLOOD GAS/LACTATE
%FIO2 (ARTERIAL): 40 %
BASE EXCESS (ARTERIAL): 4.4 mmol/L — ABNORMAL HIGH (ref 0.0–1.0)
BICARBONATE (ARTERIAL): 28.4 mmol/L — ABNORMAL HIGH (ref 18.0–26.0)
LACTATE: 1.1 mmol/L (ref 0.0–1.3)
PAO2/FIO2 RATIO: 325 (ref <=200)
PCO2 (ARTERIAL): 43 mmHg (ref 35.0–45.0)
PH (ARTERIAL): 7.44 (ref 7.35–7.45)
PO2 (ARTERIAL): 130 mmHg — ABNORMAL HIGH (ref 72.0–100.0)

## 2018-04-30 LAB — CROSSMATCH RED CELLS - UNITS: UNIT DIVISION: 0

## 2018-04-30 LAB — TYPE AND SCREEN
ABO/RH(D): O NEG
ANTIBODY SCREEN: NEGATIVE
UNITS ORDERED: 1

## 2018-04-30 LAB — MAGNESIUM
MAGNESIUM: 2.2 mg/dL (ref 1.6–2.6)
MAGNESIUM: 2.1 mg/dL (ref 1.6–2.6)

## 2018-04-30 LAB — GAMMA GT
GGT: 185 U/L — ABNORMAL HIGH (ref 7–50)
GGT: 185 U/L — ABNORMAL HIGH (ref 7–50)

## 2018-04-30 LAB — CBC
HCT: 25.5 % — ABNORMAL LOW (ref 38.9–52.0)
HGB: 7.6 g/dL — ABNORMAL LOW (ref 13.4–17.5)
MCH: 26.3 pg (ref 26.0–32.0)
MCHC: 29.8 g/dL — ABNORMAL LOW (ref 31.0–35.5)
MCV: 88.2 fL (ref 78.0–100.0)
PLATELETS: 94 x10ˆ3/uL — ABNORMAL LOW (ref 150–400)
RBC: 2.89 x10ˆ6/uL — ABNORMAL LOW (ref 4.50–6.10)
RDW-CV: 22.6 % — ABNORMAL HIGH (ref 11.5–15.5)
WBC: 3.6 x10ˆ3/uL — ABNORMAL LOW (ref 3.7–11.0)

## 2018-04-30 LAB — ALT (SGPT): ALT (SGPT): 30 U/L (ref <55)

## 2018-04-30 LAB — BRONCHOALVEOLAR LAVAGE (BAL) QUANTITATIVE CULTURE/GRAM STAIN
BRONCHOALVEOLAR LAVAGE (BAL) QUANTITATIVE CULTURE: 100 — AB
BRONCHOALVEOLAR LAVAGE (BAL) QUANTITATIVE CULTURE: 100 — AB
GRAM STAIN: NONE SEEN

## 2018-04-30 LAB — TOTAL PROTEIN: PROTEIN TOTAL: 4.8 g/dL — ABNORMAL LOW (ref 6.4–8.3)

## 2018-04-30 LAB — BILIRUBIN TOTAL: BILIRUBIN TOTAL: 4.4 mg/dL — ABNORMAL HIGH (ref 0.3–1.3)

## 2018-04-30 LAB — ALK PHOS (ALKALINE PHOSPHATASE): ALKALINE PHOSPHATASE: 147 U/L — ABNORMAL HIGH (ref 45–115)

## 2018-04-30 LAB — AST (SGOT): AST (SGOT): 66 U/L — ABNORMAL HIGH (ref 8–48)

## 2018-04-30 MED ORDER — LORAZEPAM 2 MG/ML INJECTION SOLUTION
2.00 mg | Freq: Three times a day (TID) | INTRAMUSCULAR | Status: DC
Start: 2018-05-01 — End: 2018-05-01
  Administered 2018-05-01 (×2): 2 mg via INTRAVENOUS
  Filled 2018-04-30 (×2): qty 1

## 2018-04-30 MED ORDER — POTASSIUM, SODIUM PHOSPHATES 280 MG-160 MG-250 MG ORAL POWDER PACKET
1.0000 | ORAL | Status: AC
Start: 2018-04-30 — End: 2018-04-30
  Administered 2018-04-30: 1 via GASTROSTOMY
  Filled 2018-04-30: qty 1

## 2018-04-30 MED ORDER — POTASSIUM BICARBONATE-CITRIC ACID 20 MEQ EFFERVESCENT TABLET
40.0000 meq | EFFERVESCENT_TABLET | ORAL | Status: AC
Start: 2018-04-30 — End: 2018-04-30
  Administered 2018-04-30: 40 meq via GASTROSTOMY
  Filled 2018-04-30: qty 2

## 2018-04-30 MED ORDER — POTASSIUM CHLORIDE 40 MEQ/100ML IN STERILE WATER INTRAVENOUS PIGGYBACK
40.0000 meq | INJECTION | Freq: Once | INTRAVENOUS | Status: AC
Start: 2018-04-30 — End: 2018-04-30
  Administered 2018-04-30: 0 meq via INTRAVENOUS
  Administered 2018-04-30: 40 meq via INTRAVENOUS
  Filled 2018-04-30: qty 100

## 2018-04-30 MED ORDER — LORAZEPAM 2 MG/ML INJECTION SOLUTION
INTRAMUSCULAR | Status: AC
Start: 2018-04-30 — End: 2018-04-30
  Administered 2018-04-30: 1 mg via INTRAVENOUS
  Filled 2018-04-30: qty 1

## 2018-04-30 MED ORDER — ELECTROLYTE-A INTRAVENOUS SOLUTION BOLUS
500.0000 mL | Freq: Once | INTRAVENOUS | Status: AC
Start: 2018-04-30 — End: 2018-04-30
  Administered 2018-04-30: 500 mL via INTRAVENOUS
  Administered 2018-04-30: 0 mL via INTRAVENOUS

## 2018-04-30 MED ORDER — LORAZEPAM 2 MG/ML INJECTION SOLUTION
1.0000 mg | INTRAMUSCULAR | Status: AC
Start: 2018-05-01 — End: 2018-05-01
  Administered 2018-05-01: 1 mg via INTRAVENOUS
  Filled 2018-04-30: qty 1

## 2018-04-30 MED ORDER — LORAZEPAM 2 MG/ML INJECTION SOLUTION
1.00 mg | Freq: Three times a day (TID) | INTRAMUSCULAR | Status: DC
Start: 2018-05-02 — End: 2018-05-01

## 2018-04-30 MED ORDER — FENTANYL (PF) 50 MCG/ML INJECTION SOLUTION
50.0000 ug | INTRAMUSCULAR | Status: AC
Start: 2018-04-30 — End: 2018-04-29
  Administered 2018-04-29: 50 ug via INTRAVENOUS

## 2018-04-30 MED ORDER — LORAZEPAM 2 MG/ML INJECTION SOLUTION
1.0000 mg | INTRAMUSCULAR | Status: AC
Start: 2018-04-30 — End: 2018-04-30

## 2018-04-30 MED ADMIN — fentaNYL (PF) 50 mcg/mL injection solution: INTRAVENOUS | @ 09:00:00

## 2018-04-30 MED ADMIN — famotidine 20 mg tablet: INTRAVENOUS | @ 15:00:00

## 2018-04-30 MED ADMIN — sodium chloride 0.9 % (flush) injection syringe: @ 06:00:00

## 2018-04-30 MED ADMIN — sodium chloride 0.9 % (flush) injection syringe: INTRAVENOUS

## 2018-04-30 MED ADMIN — cyanocobalamin (vit B-12) 1,000 mcg/mL injection solution: SUBCUTANEOUS | @ 09:00:00

## 2018-04-30 MED ADMIN — enoxaparin 40 mg/0.4 mL subcutaneous syringe: SUBCUTANEOUS | @ 09:00:00

## 2018-04-30 MED ADMIN — chlorhexidine gluconate 0.12 % mouthwash: ORAL | @ 21:00:00

## 2018-04-30 MED ADMIN — sodium chloride 0.9 % (flush) injection syringe: INTRAVENOUS | @ 14:00:00

## 2018-04-30 MED ADMIN — sodium chloride 0.9 % (flush) injection syringe: INTRAVENOUS | @ 09:00:00

## 2018-04-30 MED ADMIN — sodium chloride 0.9 % (flush) injection syringe: ORAL | @ 09:00:00

## 2018-04-30 MED ADMIN — promethazine 25 mg/mL injection solution: GASTROSTOMY | @ 09:00:00

## 2018-04-30 MED ADMIN — sodium chloride 0.9 % (flush) injection syringe: INTRAVENOUS | @ 21:00:00

## 2018-04-30 MED ADMIN — oxyCODONE 5 mg tablet: INTRAVENOUS | @ 20:00:00

## 2018-04-30 MED ADMIN — sodium chloride 0.9 % intravenous solution: INTRAVENOUS | @ 11:00:00

## 2018-04-30 MED ADMIN — NOREPINEPHRINE 16MG IN D5W OR NS 250ML INFUSION - WEIGHT BASED: INTRAVENOUS | @ 10:00:00

## 2018-04-30 MED ADMIN — ciprofloxacin 500 mg tablet: INTRAVENOUS | @ 15:00:00

## 2018-04-30 MED ADMIN — oxyCODONE 5 mg tablet: INTRAVENOUS | @ 06:00:00

## 2018-04-30 MED ADMIN — tiotropium bromide 18 mcg capsule with inhalation device: GASTROSTOMY | @ 09:00:00

## 2018-04-30 MED ADMIN — sodium chloride 0.9 % (flush) injection syringe: RESPIRATORY_TRACT | @ 07:00:00

## 2018-04-30 NOTE — Progress Notes (Addendum)
Trauma/Surgical Critical Care/Acute Care Surgery Staff    I saw and examined the patient. I reviewed the PA note. I agree with the findings and plan of care as documented in the PA's note. Any exceptions/additions are edited/noted.    Neuro: GCS 8-10T. Wax and wanes. Encephalopathic  EEG - read pending  Known alcoholic. Given thiamine/B12. Scheduled ativan with taper.  Continue multimodal pain medication  NCCU following  Sleep:  melatonin    Resp:   Respiratory Rate: 18  SpO2: 100 %  Recent Labs     04/29/18  0819 04/30/18  0514   FI02 30 40   PH 7.47* 7.44   PCO2 38.0 43.0   PO2 133.0* 130.0*   BICARBONATE 28.0* 28.4*   BASEEXCESS 3.9* 4.4*     Conventional settings:  Mode: SPONT  Set PEEP: 5 cmH2O  Pressure Support: 8 cmH2O  FiO2: 40 %  I:E Ratio: 1:2.8   Ac resp failure- cont mech vent-wean as tol. Will likely extubate when following commands .  CXR 04/30/2018 bil atelectasis. Mildly improved. No pneumothorax.  Continue aggressive pulmonary hygiene/IS     CV:  SBP over last 24:  Systolic (45WUJ), WJX:914 , Min:106 , Max:147   Hemodynamically acceptable. Cont to monitor  pericardial effusion on bedside echo- cardiology consulted. No tamponade physiology  3/14 Echo:   1. Normal left ventricular size. Mildly depressed left ventricular systolic function. LV Ejection Fraction is  49 %. Normal geometry. Abnormal diastolic function, elevated filling pressure.  2. Resting Segmental Wall Motion Analysis:There is hypokinesis of the  mid to apical inferior wall. There is hypokinesis of the basal to mid inferoseptal wall. The remaining left  ventricular segments demonstrate normal wall motion. consider limited definity contrast study for better  assessment of wall motion abnormalities.  3. Mildly dilated right ventricle. Normal right ventricular systolic function.  4. Mildly dilated left atrium.  5. Mild aortic valve regurgitation.  6. There is mild tricuspid regurgitation. RVSP = 44.5 mmHg.  7. The inferior vena cava  demonstrates less than 50% collapse consistent with elevated right atrial  pressure (8 mmHg).    GI:  Diet: NPO/OGT             TFs: trickles as high risk for refeeding given history of ETOH abuse  Bowel Reg-continue                Last Bowel Movement: (PTA)  GI PPX: pepcid    ENDO:For hyperglycemia management: ISS:    Renal/FEN:   Uo: adeq Monitor Uo, Slight downtrend. Will bolus slightly  And see if responds  IVFS: plasmalyte @ 100 cc/h-continue   Lytes-Hypokalemia, replace K to prevent potential neuromuscular or  cardiac events, ileus, renal abnormalities or  glucose intolerance. Recheck this afternoon  Hypophosphatemia, replace Phos to prevent potential neuologic dysfunction, delayed healing, cardiac events, hematologic dysfunction or mineral metabolism abnormalities.  Hypokalemia, replace K to prevent potential neuromuscular or  cardiac events, ileus, renal abnormalities or  glucose intolerance.  Foley-yes. critical I&O     Heme: Likely Chronic Anemia-asx Ensure all blood draws are pedi tubes to min volume taken.  Temperature: 36.7 C (98.1 F)  Recent Labs     04/28/18  1613  04/29/18  0100 04/29/18  0558 04/30/18  0428   WBC 9.0   < > 5.3  --  3.6*   HGB 9.4*   < > 6.7* 7.6* 7.6*   HCT 32.2*   < > 23.3* 25.6* 25.5*   PLTCNT 185   < >  128*  --  94*   INR 1.39*  --   --   --   --     < > = values in this interval not displayed.     ID: Temp (12hrs), Avg:36.7 C (98.1 F), Min:36.4 C (97.5 F), Max:37 C (98.6 F)  Macrobid for dirty UA   Afebrile without leukocytosis  Abx:   Cefepime, Flagyl and Vanc- need to clarify outside hospital cultures. (1/4 GPCs)  3/14 BAL 4+ PMN without organisms  3/13 NGTD    Skin/Musculoskeletal:   Extremities:  Nasal Fracture- acute on chronic-Face consulted  R prox Humeral fx - ortho consult nonop   Reduced inguinal hernia- nonop currently  Breakdown- none. Diffuse scattered abrasions of varying age  continue local wound care  DVT PPX: SCDs, Lovenox/SQ Heparin  Activity: NWB  RUEXT  Lines:  femoral A line, Femoral TLC  Disposition: cont ICU  Code Status: full    Critical Care AttestationPatient with altered mental status requiring frequent neurologic monitoring and data review. MRI pending. Acute hypoxic/hyercarbic Respiratory Failure requiring frequent monitoring and assessment including including aggressive pulmonary hygiene. No suggestion of tamponade physiology. Cont to monitor.  Monitor what appears to be mounting pancytopenia  This patient suffers from failure or dysfunction of Neurologic/Sensory, Respiratory, Cardiovascular, and Musculoskeletalsystem(s).     I was present at the bedside of this critically ill patient exclusive of procedures.The care of this patient was in regard to managing (a) conditions(s) that has a high probability of sudden, clinically significant, or life-threatening deterioration and required a high degree of Attending Physician attention and direct involvement to intervene urgently.   Data review and care planning was performed in direct proximity of the patient, examination was obviously performed in direct contact with the patient. All of this time was exclusive of procedure which will be documented elsewhere in the chart.    My critical care time is independent and unique to other providers (no other providers saw patient for purposes of SICU evaluation)  My critical care time involved full attention to the patients'condition and included:    Review of nursing notes and/or old charts  Review of medications, allergies, and vital signs  Documentation time  Consultant collaboration on findings and treatment options  Care, transfer of care, and discharge plans  Ordering, interpreting, and reviewing diagnostic studies/tab tests  Obtaining necessary history from family, EMS, nursing home staff and/or treating physicians    My critical care time did not include time spent teaching resident physician(s) or other services of resident physicians, or  performing other reported procedures.    Total Critical Care Time: 60 minutes    Electronically Signed by:    Tawana Scale MD, MS  Assistant Professor of Surgery  Trauma, Surgical Critical Care, and [place]  04/30/2018 06:54

## 2018-04-30 NOTE — Nurses Notes (Signed)
Restraint Continuation      Patient continues to have the following condition Altered Mental Status.       Least restrictive alternatives attempted : Increase patient observation, moved closer to the nurses station, call light accessible, concealed tubes/lines, decreased/removed stimulus, frequent orientation, involved patient in conversation, reoriented to person, place, time, and treatment, encouraged relaxation techniques and assessed meds/medical problems    Patient continues to exhibit the following behaviors: agitated    The restraint continued to facilitate medical/surgical treatment to ensure safety.    The patient will continue to be evaluated and assessments documented on the flowsheet to ensure that the patient is released from the restraint at the earliest possible time

## 2018-04-30 NOTE — Care Plan (Signed)
Pt currently on psv-30%, 8/5 and appears to be in no resp distress at this time.  Pt follows intermittently but not sure if pt would be able to protect airway so will remained intubated for now.  Will continue to evaluate pt for further weaning as mental status improves.

## 2018-04-30 NOTE — Respiratory Therapy (Signed)
VENTILATOR - CPAP(PS) / SPONTANEOUS CONTINUOUS Discontinue   Duration: Until Specified    Priority: Routine       Question Answer Comment   FIO2 (%) 40    Peep(cm/H2O) 5    Pressure Support(cm/H2O) 8    Indications IMPROVE DISTRIBUTION OF VENTILATION    Invasive/Non-Invasive INVASIVE         Patient on current vent settings overnight and tolerating well. No issues at this time. Will continue to monitor respiratory status.

## 2018-04-30 NOTE — Progress Notes (Signed)
[facility]                                                      Trauma Progress Note                 Date of Birth:  1960/10/13  Date of Admission:  04/28/2018  Date of service: 04/30/2018    Guy Sandifer. Christell Constant, 58 y.o., male Post trauma day 2 status post Fall    Events over the last 24 hours have included:  EEG completed  MRI completed    Subjective:    Patient remains intubated. Not following commands this morning, withdrawing to pain. Pupils remained fixed and dilated.    Objective   24 Hour Summary:    Filed Vitals:    04/30/18 0100 04/30/18 0200 04/30/18 0241 04/30/18 0400   BP:  130/74     Pulse: (!) 121 (!) 110     Resp: (!) 28 18     Temp:    36.7 C (98.1 F)   SpO2: 100% 100% 100%      Labs:  Recent Labs     04/28/18  1613 04/28/18  1711  04/29/18  0030 04/29/18  0031 04/29/18  0100  04/29/18  0558 04/29/18  0819 04/29/18  2225 04/30/18  0428 04/30/18  0514   WBC 9.0  --   --   --  5.5 5.3  --   --   --   --  3.6*  --    HGB 9.4*  --   --   --  6.8* 6.7*  --  7.6*  --   --  7.6*  --    HCT 32.2*  --   --   --  23.0* 23.3*  --  25.6*  --   --  25.5*  --    SODIUM 137  --   --  140  --   --   --   --   --   --  141  --    POTASSIUM 3.6  --   --  3.2*  --   --   --   --   --   --  2.7*  --    CHLORIDE 107  --   --  108  --   --   --   --   --   --  107  --    BICARBONATE  --   --    < >  --  26.9*  --    < > 26.8* 28.0*  --   --  28.4*   BUN 3*  3*  --   --  4*  --   --   --   --   --   --  4*  --    CREATININE 0.72  0.72  --   --  0.68  --   --   --   --   --   --  0.57*  --    GLUCOSE  --  Negative  --   --   --   --   --   --   --   --   --   --    ANIONGAP 9  --   --  6  --   --   --   --   --   --  6  --    CALCIUM 7.3*  --   --  6.9*  --   --   --   --   --   --  7.1*  --    MAGNESIUM 1.7  --   --  1.7  --   --   --   --   --   --  2.2  --    PHOSPHORUS 2.5  --   --  2.1*  --   --   --   --   --  2.1* 3.1  --    INR 1.39*  --   --   --   --   --   --   --   --   --   --   --     < > = values in  this interval not displayed.       Intake/Output Summary (Last 24 hours) at 04/30/2018 0603  Last data filed at 04/30/2018 0300  Gross per 24 hour   Intake 2219 ml   Output 1580 ml   Net 639 ml     Nutrition Management: MNT PROTOCOL FOR DIETITIAN  ADULT TUBE FEEDING - CONTINUOUS DRIP CONTINUOUS NO MEALS, TF ONLY; OSMOLITE 1.5; OG; Initial Rate (ml/hr): 10; Increase by: 0; Goal Rate (ml/hr): 10 Last Bowel Movement: (PTA)  Recent Labs     04/28/18  1613   ALBUMIN 1.8*     Current Medications:    Current Facility-Administered Medications:   .  acetaminophen (TYLENOL) 160 mg per 5 mL liquid - grape flavored, 650 mg, Gastric (NG, OG, PEG, GT), Q4H PRN, Thimm, Terra, MD, 650 mg at 04/29/18 0316  .  chlorhexidine gluconate (PERIDEX) 0.12% mouthwash, 15 mL, Swish & Spit, 2x/day, Kasandra Knudsen, Victorino Dike, MD, 15 mL at 04/29/18 2208  .  cyanocobalamin (VITAMIN B12) 1000 mcg/mL injection, 1,000 mcg, Subcutaneous, Daily, Michaelle Birks, MD, 1,000 mcg at 04/29/18 0828  .  electrolyte-A (PLASMALYTE-A) premix infusion, , Intravenous, Continuous, McCoy, Skyler, MD, Last Rate: 100 mL/hr at 04/30/18 0013  .  enoxaparin PF (LOVENOX) 40 mg/0.4 mL SubQ injection, 40 mg, Subcutaneous, Q12H, Chung, Hansol, MD, 40 mg at 04/29/18 2014  .  famotidine (PEPCID) tablet, 20 mg, Gastric (NG, OG, PEG, GT), 2x/day, Kasandra Knudsen, Victorino Dike, MD, 20 mg at 04/29/18 2208  .  HYDROmorphone 2 mg/mL injection ---Jaymes Graff, , , , , Stopped at 04/29/18 0900  .  ipratropium-albuterol 0.5 mg-3 mg(2.5 mg base)/3 mL Solution for Nebulization, 3 mL, Nebulization, Q6H, McCoy, Skyler, MD, 3 mL at 04/30/18 0241  .  lanolin-oxyquin-pet, hydrophil (BAG BALM) topical ointment, , Apply Topically, 2x/day PRN, Michaelle Birks, MD  .  LORazepam (ATIVAN) 2 mg/mL injection, 2 mg, Intravenous, Q8HR, 2 mg at 04/29/18 2205 **FOLLOWED BY** LORazepam (ATIVAN) 2 mg/mL injection, 1 mg, Intravenous, Q8HR **FOLLOWED BY** [START ON 05/02/2018] LORazepam (ATIVAN) 2 mg/mL injection,  0.5 mg, Intravenous, Q8HR, Thimm, Terra, MD  .  nitrofurantoin (FURADANTIN) 25mg  per 94mL oral liquid, 50 mg, Gastric (NG, OG, PEG, GT), 4x/day, Kasandra Knudsen, Victorino Dike, MD, 50 mg at 04/29/18 2209  .  INSERT & MAINTAIN PERIPHERAL IV ACCESS, , , UNTIL DISCONTINUED **AND** DRESSING CHANGE Saline Lock; DRY STERILE, , , PRN **AND** NS flush syringe, 2 mL, Intracatheter, Q8HRS, 2 mL at 04/29/18 2209 **AND** NS flush syringe, 2-6 mL, Intracatheter, Q1 MIN PRN, Dory Peru, Hansol, MD  .  perflutren lipid microspheres (DEFINITY) 1.3 mL in NS 10 mL (tot vol) injection, 2 mL, Intravenous,  Give in Cardiology, Darlis Loanhimm, Terra, MD  .  potassium bicarbonate-citric acid (EFFER-K) effervescent tablet, 40 mEq, Gastric (NG, OG, PEG, GT), Now, Thimm, Terra, MD  .  potassium chloride 40 mEq in SW 100 mL premix infusion - CENTRAL LINE ONLY, 40 mEq, Intravenous, Once, Thimm, Terra, MD  .  senna concentrate (SENNA) 528mg  per 15mL oral liquid, 5 mL, Gastric (NG, OG, PEG, GT), 2x/day, Kasandra KnudsenKnight Davis, Victorino DikeJennifer, MD, 176 mg at 04/29/18 2208  .  SSIP insulin R human (HUMULIN R) 100 units/mL injection, 0-12 Units, Subcutaneous, Q4H PRN **AND** POCT WHOLE BLOOD GLUCOSE, , , Q4H, Thimm, Terra, MD  .  thiamine (VITAMIN B1) 250 mg in NS 50 mL IVPB, 250 mg, Intravenous, Q24H, Thimm, Terra, MD  .  thiamine (VITAMIN B1) 500 mg in NS 50 mL IVPB, 500 mg, Intravenous, 3x/day, Darlis Loanhimm, Terra, MD, Stopped at 04/29/18 2241  .  [START ON 05/04/2018] thiamine-vitamin B1 (BETAXIN) tablet, 50 mg, Gastric (NG, OG, PEG, GT), 2x/day, Thimm, Terra, MD     Today's Physical Exam:  GEN:   Chronically ill appearing  HEENT:   Pupils equal but not reactive. EEG leads in place  PULM:   Mechanically ventilated  CV:   Tachycardic  ABD:   Soft, nondistended. Left inguinal hernia extending into scrotum.   NEURO:   GCS 3T, withdrawl response to sternal rub or nail bed pressure. Extremities noted to be twitching   Integumentary:  Feet with scatter wounds, dry in nature.    Assessment/ Plan:      Active Hospital Problems   (*Primary Problem)    Diagnosis   . *Fall     "found down" and taken to outside hospital     . Hypokalemia   . Alcohol abuse   . Troponin I above reference range   . Acute respiratory failure (CMS HCC)   . Nasal fracture     Acute vs chronic     . Abrasions of multiple sites   . Scalp hematoma   . Humeral fracture     nonop per ortho     . UTI (urinary tract infection)     At outside hospital. Repeat UA pending     . Left inguinal hernia     S/p reduction     . Anasarca     DVT prophylaxis:  Enoxaparin and SCDs/ Venodynes/Impulse boots  Anticoagulants (last 24 hours)     Date/Time Action Medication Dose    04/29/18 0827 Given    enoxaparin PF (LOVENOX) 40 mg/0.4 mL SubQ injection 40 mg    04/28/18 2020 Given    enoxaparin PF (LOVENOX) 40 mg/0.4 mL SubQ injection 40 mg        Nutrition: MNT PROTOCOL FOR DIETITIAN  ADULT TUBE FEEDING - CONTINUOUS DRIP CONTINUOUS NO MEALS, TF ONLY; OSMOLITE 1.5; OG; Initial Rate (ml/hr): 10; Increase by: 0; Goal Rate (ml/hr): 10 diet, Last Bowel Movement: (PTA)  Activity: No limitations    Pain:   Analgesics (last 24 hours)     Date/Time Action Medication Dose    04/29/18 0316 Given    acetaminophen (TYLENOL) 160 mg per 5 mL liquid - grape flavored 650 mg    04/28/18 1720 Given    fentaNYL (SUBLIMAZE) 50 mcg/mL injection 100 mcg    04/28/18 1648 Given    fentaNYL (SUBLIMAZE) 50 mcg/mL injection 50 mcg    04/28/18 1633 Given    fentaNYL (SUBLIMAZE) 50 mcg/mL injection 100 mcg        PT Recommendations:  Pending    Plan:     Poor neurological exam   - EEG - no seizures   - NCCU consult   - CTA intra/extracranial - no significant findings   - Follow up MRI    Left inguinal hernia   - Reducible at bedside   - Continue to monitor for overlying skin changes   - Will attempt daily reduction    Right humeral neck fracture   - Ortho consulted   - No operative intervention at this time   - Sling as needed    Nasal bone fracture   - ENT consult   - Will re-eval when  more awake    UA with moderate leuks, culture pending  Increase tube feeds to goal as able  Continue DVT prophylaxis  Wean vent as able    Gerhard Munch, MD      I saw and examined the patient.  I reviewed the resident's note.  I agree with the findings and plan of care as documented in the resident's note.  Any exceptions/additions are edited/noted.    Mallie Mussel, MD

## 2018-04-30 NOTE — Procedures (Signed)
NAME:  Antonio Gillespie, COZINE  HOSPITAL NUMBER:  [id number]  DATE OF SERVICE:  04/29/2018  DOB:  December 05, 1960  SEX:  M      Date of Study:  April 29, 2018, start time 2200, end time April 30, 2018, 12:36  EEG #: [id number].  Technician:  PD.    REQUESTING PHYSICIAN:  Mallie Mussel, MD.    HISTORY:  The patient is a 58 year old man with history of alcoholism who was found down.  EEG is requested to evaluate for subclinical seizures.    MEDICATIONS:  Lorazepam as needed.    REPORT:  This is a digitally acquired video EEG performed with the standard 10-20 system of electrode placement.   Awake background was characterized by diffuse low-voltage beta activity.  No clear posterior dominant rhythm was seen.   Background was continuous and symmetric.   State change was seen.  No clear sleep architecture was seen.   Hyperventilation and photic stimulation were not performed.   No clinical spells were marked.    ABNORMAL ACTIVITY:  No seizures or epileptiform discharges were seen.    INTERPRETATION:  Low-voltage record with diffuse attenuation.    No seizures or epileptiform discharges were seen.      Melynda Ripple MD  Assistant Professor, Neurology  [facility]            CC:   Mallie Mussel, MD   [facility] [address]   [address]   [place], [place] [address]       DD:  04/30/2018 12:33:33  DT:  04/30/2018 13:21:24 CB  D#:  [id number]

## 2018-04-30 NOTE — Nurses Notes (Signed)
RN also contacted by [facility] lab staff about a positive blood culture result, stated they collected 2 pediatric 1 bottle samples, states one bottle had previously resulted with gram + cocci, states now also + for gram + cocci with strep, so the pt popped back + for "strep and staph cocci." SICU notified. Other culture remains negative. Blood cultures sent at this facility also.

## 2018-04-30 NOTE — Nurses Notes (Addendum)
SICU service, Dr. Renee Harder, notified of HR 120-130's and respiratory rate 30's and utilizing accessory/abdominal muscles; at bedside to visualize patient. Verbalized to call if worsens. Will continue to monitor.     2258: Spoke with Dr. Renee Harder regarding RR continuing to be in the 30's. Verbalized to report back in an hour if this hasn't changed.     2329: SICU service, Dr. Renee Harder, at bedside to assess patient. Vent settings changed per flow sheet. Ativan dosing/frequency adjusted.

## 2018-04-30 NOTE — Care Plan (Signed)
EEG continued. Patient went to MRI over night. Phosphorus replaced. Fall and skin precautions maintained. Frequent repositioning encouraged.PUP bundle and Q2 turns maintained. Instructed to call for assistance. Call bell within reach. Emotional support provided to patient. Patient prefers to be called Antonio Gillespie per family.    Problem: Adult Inpatient Plan of Care  Goal: Plan of Care Review  Outcome: Ongoing (see interventions/notes)  Flowsheets (Taken 04/30/2018 0353)  Plan of Care Reviewed With: patient  Progress: no change     Problem: Adult Inpatient Plan of Care  Goal: Patient-Specific Goal (Individualized)  Outcome: Ongoing (see interventions/notes)  Flowsheets (Taken 04/30/2018 0353)  Individualized Care Needs: Maintain skin precautions, Q2 turns

## 2018-04-30 NOTE — Progress Notes (Signed)
[facility]                                               SICU PROGRESS NOTE    Patient Name: Antonio Gillespie, See  ICU Day: 1  Date of Service: 04/30/2018  MRN: [id number]  BRIEF HPI:  Antonio Gillespie is a 58 y.o. male with PMH of atrial fibrillation, COPD, EtOH abuse who was transferred from outside facility. Per chart review, family of patient had not heard from him and called the police who found him in his home lying face down and covered in urine/feces. Patient was intubated at outside facility ED. Pan CT scan revealed a comminuted right humeral neck fracture, pulmonary artery enlargement, large left inguinal hernia. CT head showed bilateral scalp hematomas. CT c-spine negative. XR right hand and forearm were negative for fractures.       SUBJECTIVE INFORMATION  Overnight Events: Started following commands intermittently, MRI complete  Response to treatment in last 24 hours: Clinically improving slowly  Nursing Concerns: None at this time   Family Concerns: No family at bedside this morning     OBJECTIVE INFORMATION  Vital Signs/Hemodynamics:  Heart Rate: (!) 110  BP (Non-Invasive): 130/74  MAP (Non-Invasive): 88 mmHG  Respiratory Rate: 18  SpO2: 100 %  Temperature: 36.7 C (98.1 F)  Flowtrac/Hemodynamics:   ART line, non-invasive blood pressure cuff   Neuro:  GCS- 10 best    Today's Physical Exam:  GEN:   intubated, chronically ill  HEENT:   Pupils 71m bilaterally,large posterior scalp hematoma  NECK:   Trachea midline, cervical collar in place, intubated   PULM:   Diminished breath sounds bilaterally, course, intubated   CARDIAC: Afib   CHEST::No abrasions or contusions  ABD:   Abdomen soft, and nondistended  PELVIS: No evidence of injury  GU/RECTAL: large scrotal edema  BACK:  No evidence of injury and no step-off  MS: ulcers noted on bilateral knees and feet  NEURO:  Intubated, following commands  GLASGOW COMA SCALE: Eye opening: 4 spontaneous, Verbal resonse:  T  Intubated, Best motor response:  6 obeys commands  VASCULAR:  All pulses palpable and equal bilaterally  INTEGUMENTARY:  Pink, warm, and dry and anasarca    Laboratory Data: Pertinent labs available at the time of patient evaluation were reviewed, values can be reviewed in the results section of the EMR.  CBC Results Differential Results   Recent Results (from the past 30 hour(s))   H & H    Collection Time: 04/29/18  5:58 AM   Result Value    HGB 7.6 (L)    HCT 25.6 (L)    No results found for this or any previous visit (from the past 30 hour(s)).   BMP Results Other Chemistries Results   Results for orders placed or performed during the hospital encounter of 04/28/18 (from the past 30 hour(s))   BASIC METABOLIC PANEL    Collection Time: 04/30/18  4:28 AM   Result Value    SODIUM 141    POTASSIUM 2.7 (LL)    CHLORIDE 107    CO2 TOTAL 28    GLUCOSE 87    BUN 4 (L)    CREATININE 0.57 (L)    Recent Results (from the past 30 hour(s))   PHOSPHORUS    Collection Time: 04/30/18  4:28 AM   Result  Value    PHOSPHORUS 3.1   MAGNESIUM    Collection Time: 04/30/18  4:28 AM   Result Value    MAGNESIUM 2.2      Liver/Pancreas Enzyme Results Liver Function Results   Recent Results (from the past 30 hour(s))   GAMMA GT    Collection Time: 04/30/18  4:28 AM   Result Value    GGT 185 (H)   ALK PHOS (ALKALINE PHOSPHATASE)    Collection Time: 04/30/18  4:28 AM   Result Value    ALKALINE PHOSPHATASE 147 (H)   AST (SGOT)    Collection Time: 04/30/18  4:28 AM   Result Value    AST (SGOT) 66 (H)   ALT (SGPT)    Collection Time: 04/30/18  4:28 AM   Result Value    ALT (SGPT) 30    Recent Results (from the past 30 hour(s))   BILIRUBIN TOTAL    Collection Time: 04/30/18  4:28 AM   Result Value    BILIRUBIN TOTAL 4.4 (H)      Cardiac Results Coags Results   Results for orders placed or performed during the hospital encounter of 04/28/18 (from the past 30 hour(s))   TROPONIN-I    Collection Time: 04/29/18 11:49 AM   Result Value    TROPONIN I  82 (H)    No results found for this or any previous visit (from the past 30 hour(s)).       Radiographic Studies: Pertinent radiology result available at the time of patient evaluation were reviewed, reports can be reviewed in the results section of the EMR.    CXR- Slight improvement since previous study      [facility] Problems    Diagnosis   . Primary Problem: Fall   . Thrombocytopenia Unspecified   . Hypokalemia   . Alcohol abuse   . Troponin I above reference range   . Acute respiratory failure (CMS HCC)   . Nasal fracture   . Abrasions of multiple sites   . Scalp hematoma   . Humeral fracture   . UTI (urinary tract infection)   . Left inguinal hernia   . Anasarca       Neurologic  . Pain: Pain Score (Numeric, Faces): Other  . CPOT Score: 3   . Agitation (RASS): RASS ([place] Agitation-Sedation Scale): 1-->restless   . Delirium (CAM-ICU): CAM-ICU Assessment: Proceed with CAM  . Feature 1: Acute Onset or Fluctuating Course: Positive  . Feature 2: Inattention: Positive  . Feature 3: Altered Level of Consciousness: Positive  . Feature 4: Disorganized Thinking: Positive  . Overall CAM-ICU (Delirium Present): Positive  . Analgesia: Tylenol   . Sedation: None  . Chemical Paralysis: None  . Delirium: Unable to assess   . Substance Use Replacement: Lorazepam  . Seizure Prophylaxis: Not Indicated  . Neurologic Diagnoses: Encephalopathic   . Spontaneous Awakening Trial: Failed  . Neurologic Plan of Care: Maintain sleep/awake cycle, promote sleep hygiene  . NCCU- consult orders placed recs appreciated    . ENT- consult placed recs appreciated    Pulmonary  . Pulmonary Diagnoses: Acute Respiratory Failure, Acute Respiratory Insufficiency, Airway Compromise and COPD  . Mechanical Ventilation - Yes Mode CPAP, FiO2 40%, PEEP 5, PS 8, I:E ratio 1:2.4  . Airway - Endotracheal Tube  . Non-Invasive Ventilation - No  . Arterial Blood gas interpretation:PH 7.44, PCO2 43, PO2 130, HCO3  28.4, P/F ratio 325, Lactate 1.1  .  Spontaneous Breathing Trial:Not Appropriate - Exclusion  . Pulmonary Plan of Care: Aggressive pulmonary toilet   . Duo nebs- ordered   . Daily CXR  Cardiovascular  . Cardiovascular Diagnoses: History Afib   . Vasoactive Agents: None at this time   . Hemodynamic Monitoring:  ART line, non invasive blood pressure cuff   . Telemetry: Sinus Tachycardia  . Antiarrhythmics: None   . Blood Pressure Management: SBP goal less than 160  . Cardiovascular Plan of Care: Will continue to monitor and treat   Renal       Intake/Output Summary (Last 24 hours) at 04/30/2018 0623  Last data filed at 04/30/2018 0300  Gross per 24 hour   Intake 2219 ml   Output 1580 ml   Net 639 ml         . Fluid Balance: Positive 1.3L overnight   . Renal Diagnoses: None at this time   . Electrolyte Disorders: No gross abnormalities   . Renal Replacement Therapy: No  . Diuresis:No  . IV Fluids: Plasmalyte (Rate: 75 cc/hr)  . Renal Plan of Care: Will continue to monitor   Gastrointestinal  . Diet/Nutrition: Continuous Tube Feeds 10 ml/hr   . Bowel Movement: Last Bowel Movement: (PTA)  . Nutrition Access: NG/OG Tube  . Bowel Regimen: Colace and Senokot  . GI Prophylaxis: H2 blocker  . GI Plan of Care: Will advance diet when medically appropriate   Hematologic  . Anemia: Yes Acute Blood Loss H&H 7.6/25.5   Transfusion Criteria: Hemoglobin less than 7   Thrombocytopenia - No 146   DVT Prophylaxis: Enoxaparin and SCDs/ Venodynes/Impulse boots   Heme Plan of Care: Will continue to monitor and treat as appropriate   Immune/ID                WBC-5.7, afebrile                Blood cultures: NGTD                Urine cultures: NGTD                BAL: Staphylococcus species, MRSA negative                OR cultures: none                ABX: Nitrofurantoin for UTI (stop date 05/01/2018)   ID Plan of Care: Will continue to monitor for infectious process   Endocrine   Steroid Use:No   Insulin use: Sliding Scale blood sugar   108-165   Endocrine Plan of Care: Will continue to monitor   Musculoskeletal   Weight Bearing: partial weight bearing   Mobility/Activity: OOB as tolerated    Head of Bed Elevated: 30 degrees    MSK Plan of Care: Will need aggressive PT/OT for physical deconditioning   Integumentary   Skin Breakdown: NONE except for surgical site   Skin Prophylaxis: Mepilex and Turn every 2 hours   Skin/Wound Plan of Care: Will continue to promote healthy skin integrity     Patient Lines/Drains/Airways Status             Active Line / Dialysis Catheter / Dialysis Graft / Drain / Airway / Wound              Name: Placement date: Placement time: Site: Days:     Peripheral IV Ultrasound guided;Extended dwell catheter Right Forearm  05/01/18   1100   2  Peripheral IV Ultrasound guided;Extended dwell catheter Left;Lower Forearm  05/01/18   1400   2     Peripheral IV Extended dwell catheter;Ultrasound guided Left Forearm  05/01/18   1400   2     Arterial Line Right Radial  05/01/18   1425   Radial  2     Gastrostomy Tube Left  05/03/18   --   1     Foley Catheter  04/28/18   1615   5     Tracheostomy 8 Cuffed  05/03/18   --   1     Wound (Non-Surgical) Left Chest  04/28/18   1600   5     Wound (Non-Surgical) Left;Right Toe  04/28/18   1600   5     Wound (Non-Surgical) Left Mouth  04/28/18   1600   5     Wound (Non-Surgical) Left;Right Knee  04/28/18   1600   5     Surgical Incision Bilateral Neck  05/03/18   1525   less than 1                 Patient has decision making capacity: no  Family Updated today: no  Advance Directive: Medical Power of [place], Health Care Surrogate  Code Status Information     Code Status    Full Code          Co-Managing Services: Trauma, Orthopeadics, ENT                       Samule Ohm, PA-C 04/30/2018, 06:23  Trauma/Surgical Critical Care/Acute Care Surgery Staff    I saw and examined the patient. I reviewed the PA note. I agree with the findings and plan of  care as documented in the PA's note. Any exceptions/additions are edited/noted.    Neuro: GCS 8-10T. Wax and wanes. Encephalopathic  EEG - read pending  Known alcoholic. Given thiamine/B12. Scheduled ativan with taper.  Continuemultimodal pain medication  NCCU following  Sleep: melatonin    Resp:   Respiratory Rate: 18  SpO2: 100 %       Recent Labs     04/29/18  0819 04/30/18  0514   FI02 30 40   PH 7.47* 7.44   PCO2 38.0 43.0   PO2 133.0* 130.0*   BICARBONATE 28.0* 28.4*   BASEEXCESS 3.9* 4.4*     Conventional settings:  Mode: SPONT  Set PEEP: 5 cmH2O  Pressure Support: 8 cmH2O  FiO2: 40 %  I:E Ratio: 1:2.8   Ac resp failure- cont mech vent-wean as tol. Will likely extubate when following commands .  CXR3/15/2020bil atelectasis. Mildly improved. No pneumothorax.  Continue aggressive pulmonary hygiene/IS    CV:  SBP over last 24:  Systolic (35TDD), UKG:254 , Min:106 , Max:147   Hemodynamically acceptable. Cont to monitor  pericardial effusion on bedside echo- cardiology consulted. No tamponade physiology  3/14Echo:  1. Normal left ventricular size. Mildly depressed left ventricular systolic function. LV Ejection Fraction is  49 %. Normal geometry. Abnormal diastolic function, elevated filling pressure.  2. Resting Segmental Wall Motion Analysis:There is hypokinesis of the  mid to apical inferior wall. There is hypokinesis of the basal to mid inferoseptal wall. The remaining left  ventricular segments demonstrate normal wall motion. consider limited definity contrast study for better  assessment of wall motion abnormalities.  3. Mildly dilated right ventricle. Normal right ventricular systolic function.  4. Mildly dilated left atrium.  5. Mild aortic  valve regurgitation.  6. There is mild tricuspid regurgitation. RVSP = 44.5 mmHg.  7. The inferior vena cava demonstrates less than 50% collapse consistent with elevated right atrial  pressure (8 mmHg).    GI:       Diet:NPO/OGT  OZH:YQMVHQIO as  high risk for refeeding given history of ETOH abuse  Bowel Reg-continueLast Bowel Movement: (PTA)  GI NGE:XBMWUX    ENDO:For hyperglycemia management: ISS:    Renal/FEN:   Uo: adeq Monitor Uo, Slight downtrend. Will bolus slightly  And see if responds  IVFS:plasmalyte @ 100 cc/h-continue  Lytes-Hypokalemia, replace K to prevent potential neuromuscular or cardiac events, ileus, renal abnormalities or glucose intolerance. Recheck this afternoon  Hypophosphatemia, replace Phos to prevent potential neuologic dysfunction, delayed healing, cardiac events, hematologic dysfunction or mineral metabolism abnormalities.  Hypokalemia, replace K to prevent potential neuromuscular or  cardiac events, ileus, renal abnormalities or  glucose intolerance.  Foley-yes. critical I&O     Heme: Likely ChronicAnemia-asxEnsure all blood draws are pedi tubes to min volume taken.  Temperature: 36.7 C (98.1 F)          Recent Labs     04/28/18  1613  04/29/18  0100 04/29/18  0558 04/30/18  0428   WBC 9.0   < > 5.3  --  3.6*   HGB 9.4*   < > 6.7* 7.6* 7.6*   HCT 32.2*   < > 23.3* 25.6* 25.5*   PLTCNT 185   < > 128*  --  94*   INR 1.39*  --   --   --   --     < > = values in this interval not displayed.     ID: Temp (12hrs), Avg:36.7 C (98.1 F), Min:36.4 C (97.5 F), Max:37 C (98.6 F)  Macrobid for dirty UA   Afebrilewithout leukocytosis  LKG:[id number], Flagyl and Vanc- need to clarify outside hospital cultures. (1/4 GPCs)  3/14 BAL 4+ PMN without organisms  3/13 NGTD    Skin/Musculoskeletal:   Extremities:  Nasal Fracture- acute on chronic-Face consulted  R prox Humeral fx - ortho consult nonop   Reduced inguinal hernia- nonop currently  Breakdown- none. Diffuse scattered abrasions of varying age  continue local wound care  DVT DGU:YQIH, Lovenox/SQ Heparin  Activity: NWB RUEXT  Lines:femoral A line, Femoral TLC  Disposition:cont ICU  Code Status:full    Critical Care AttestationPatient withaltered  mental status requiringfrequent neurologic monitoring and data review.MRI pending.Acute hypoxic/hyercarbic Respiratory Failure requiring frequent monitoring and assessment including including aggressive pulmonary hygiene.No suggestion of tamponade physiology. Cont to monitor. Monitor what appears to be mounting pancytopenia  This patient suffers from failure or dysfunction of Neurologic/Sensory,Respiratory, Cardiovascular,andMusculoskeletalsystem(s).     I was present at the bedside of this critically ill patient exclusive of procedures.The care of this patient was in regard to managing (a) conditions(s) that has a high probability of sudden, clinically significant, or life-threatening deterioration and required a high degree of Attending Physician attention and direct involvement to intervene urgently.   Data review and care planning was performed in direct proximity of the patient, examination was obviously performed in direct contact with the patient. All of this time was exclusive of procedure which will be documented elsewhere in the chart.    My critical care time is independent and unique to other providers (no other providers saw patient for purposes of SICU evaluation)  My critical care time involved full attention to the patients'condition and included:    Review of nursing notes and/or  old charts  Review of medications, allergies, and vital signs  Documentation time  Consultant collaboration on findings and treatment options  Care, transfer of care, and discharge plans  Ordering, interpreting, and reviewing diagnostic studies/tab tests  Obtaining necessary history from family, EMS, nursing home staff and/or treating physicians    My critical care time did not include time spent teaching resident physician(s) or other services of resident physicians, or performing other reported procedures.    Total Critical Care Time:23mnutes    Electronically Signed by:    CTawana ScaleMD,  MS  Assistant Professor of Surgery  Trauma, Surgical Critical Care, and [place] 04/30/2018 06:54

## 2018-04-30 NOTE — Nurses Notes (Signed)
04/30/18 2000   Restraint Order (Document with every START action and daily when new order obtained)   Order Obtained Yes   Length of Order 24   Restraint Type(Q2 Hours)   Restraint Used M   Soft Restraint Right Wrist CONTINUED   Soft Restraint Left Wrist CONTINUED   Pt Assessment (Q2 Hours)   Reason For Restraint Limit ability/pt attempting to remove artificial airway;Limit ability/pt attempting to pull IV lines;Limit ability/pt attempting to pull tubes;Intermittent break through RASS>-2   Pt's current behavior/status Confused;Restless   Emotional Support Provided   Restraint Monitoring (Q2 Hours)   Assessed For Continued Need Yes   Visual Check CF;AG   Circulation NS   Restraint Removed for Assessment Yes   Range of Motion P   Fluids N   Food/Meal/Bottle N   Elimination UC   Education Group (Q2 Hours)   Discontinuation Criteria Able to follow directions;Resolution of contributing medical condition;Discontinuation of medical treatment;Decreased agitation   Criteria Explained YES   Patient/Co-Learner's Response NL   Restraint Plan of Care   Restraint Plan of Care Updated     Restraint Continuation      Patient continues to have the following condition altered mental status related to acute illness.       Least restrictive alternatives attempted : Increase patient observation, call light accessible, concealed tubes/lines, decreased/removed stimulus, frequent orientation, involved patient in conversation, reoriented to person, place, time, and treatment, encouraged relaxation techniques, redirected behavior, used TV/music and assessed meds/medical problems    Patient continues to exhibit the following behaviors: confused and restless    The restraint continued to facilitate medical/surgical treatment to ensure safety.    The patient will continue to be evaluated and assessments documented on the flowsheet to ensure that the patient is released from the restraint at the earliest possible time

## 2018-05-01 ENCOUNTER — Inpatient Hospital Stay (HOSPITAL_COMMUNITY): Payer: Medicaid Other

## 2018-05-01 ENCOUNTER — Encounter (HOSPITAL_COMMUNITY): Payer: Self-pay | Admitting: Family

## 2018-05-01 DIAGNOSIS — J9811 Atelectasis: Secondary | ICD-10-CM

## 2018-05-01 DIAGNOSIS — E873 Alkalosis: Secondary | ICD-10-CM

## 2018-05-01 DIAGNOSIS — S80812A Abrasion, left lower leg, initial encounter: Secondary | ICD-10-CM

## 2018-05-01 DIAGNOSIS — E43 Unspecified severe protein-calorie malnutrition: Secondary | ICD-10-CM

## 2018-05-01 DIAGNOSIS — F10239 Alcohol dependence with withdrawal, unspecified: Secondary | ICD-10-CM

## 2018-05-01 DIAGNOSIS — T1490XA Injury, unspecified, initial encounter: Secondary | ICD-10-CM

## 2018-05-01 DIAGNOSIS — S80811A Abrasion, right lower leg, initial encounter: Secondary | ICD-10-CM

## 2018-05-01 DIAGNOSIS — R451 Restlessness and agitation: Secondary | ICD-10-CM

## 2018-05-01 LAB — BASIC METABOLIC PANEL
ANION GAP: 7 mmol/L (ref 4–13)
ANION GAP: 6 mmol/L (ref 4–13)
ANION GAP: 6 mmol/L (ref 4–13)
BUN/CREA RATIO: 8 (ref 6–22)
BUN/CREA RATIO: 10 (ref 6–22)
BUN: 4 mg/dL — ABNORMAL LOW (ref 8–25)
BUN: 5 mg/dL — ABNORMAL LOW (ref 8–25)
CALCIUM: 7.4 mg/dL — ABNORMAL LOW (ref 8.5–10.2)
CALCIUM: 7.4 mg/dL — ABNORMAL LOW (ref 8.5–10.2)
CHLORIDE: 108 mmol/L (ref 96–111)
CHLORIDE: 105 mmol/L (ref 96–111)
CO2 TOTAL: 29 mmol/L (ref 22–32)
CO2 TOTAL: 29 mmol/L (ref 22–32)
CREATININE: 0.53 mg/dL — ABNORMAL LOW (ref 0.62–1.27)
CREATININE: 0.5 mg/dL — ABNORMAL LOW (ref 0.62–1.27)
ESTIMATED GFR: >60 mL/min/{1.73_m2} (ref >60)
ESTIMATED GFR: >60 mL/min/{1.73_m2} (ref >60)
ESTIMATED GFR: >60 mL/min/{1.73_m2} (ref >60)
GLUCOSE: 112 mg/dL (ref 65–139)
GLUCOSE: 108 mg/dL (ref 65–139)
POTASSIUM: 3.8 mmol/L (ref 3.5–5.1)
POTASSIUM: 3.3 mmol/L — ABNORMAL LOW (ref 3.5–5.1)
SODIUM: 144 mmol/L (ref 136–145)
SODIUM: 140 mmol/L (ref 136–145)

## 2018-05-01 LAB — ARTERIAL BLOOD GAS/LACTATE
%FIO2 (ARTERIAL): 50 %
%FIO2 (ARTERIAL): 30 %
%FIO2 (ARTERIAL): 30 %
BASE EXCESS (ARTERIAL): 7.6 mmol/L — ABNORMAL HIGH (ref 0.0–1.0)
BASE EXCESS (ARTERIAL): 9 mmol/L — ABNORMAL HIGH (ref 0.0–1.0)
BASE EXCESS (ARTERIAL): 5.8 mmol/L
BICARBONATE (ARTERIAL): 30.9 mmol/L — ABNORMAL HIGH (ref 18.0–26.0)
BICARBONATE (ARTERIAL): 32 mmol/L — ABNORMAL HIGH (ref 18.0–26.0)
BICARBONATE (ARTERIAL): 29.4 mmol/L
LACTATE: 1.3 mmol/L (ref 0.0–1.3)
LACTATE: 1 mmol/L (ref 0.0–1.3)
LACTATE: 1.3 mmol/L
PAO2/FIO2 RATIO: 362 (ref <=200)
PAO2/FIO2 RATIO: 423 (ref <=200)
PAO2/FIO2 RATIO: 290
PCO2 (ARTERIAL): 43 mmHg (ref 35.0–45.0)
PCO2 (ARTERIAL): 44 mmHg (ref 35.0–45.0)
PCO2 (ARTERIAL): 44 mm/Hg
PH (ARTERIAL): 7.48 — ABNORMAL HIGH (ref 7.35–7.45)
PH (ARTERIAL): 7.49 — ABNORMAL HIGH (ref 7.35–7.45)
PH (ARTERIAL): 7.45
PO2 (ARTERIAL): 181 mmHg — ABNORMAL HIGH (ref 72.0–100.0)
PO2 (ARTERIAL): 127 mmHg — ABNORMAL HIGH (ref 72.0–100.0)
PO2 (ARTERIAL): 87 mm/Hg

## 2018-05-01 LAB — CBC
HCT: 26 % — ABNORMAL LOW (ref 38.9–52.0)
HGB: 7.9 g/dL — ABNORMAL LOW (ref 13.4–17.5)
MCH: 26.6 pg (ref 26.0–32.0)
MCHC: 30.4 g/dL — ABNORMAL LOW (ref 31.0–35.5)
MCV: 87.5 fL (ref 78.0–100.0)
PLATELETS: 117 10*3/uL — ABNORMAL LOW (ref 150–400)
RBC: 2.97 10*6/uL — ABNORMAL LOW (ref 4.50–6.10)
RDW-CV: 22.7 % — ABNORMAL HIGH (ref 11.5–15.5)
WBC: 4.3 10*3/uL (ref 3.7–11.0)

## 2018-05-01 LAB — PHOSPHORUS
PHOSPHORUS: 2.5 mg/dL (ref 2.4–4.7)
PHOSPHORUS: 2.4 mg/dL (ref 2.4–4.7)
PHOSPHORUS: 2.4 mg/dL (ref 2.4–4.7)

## 2018-05-01 LAB — POC BLOOD GLUCOSE (RESULTS)
GLUCOSE, POC: 116 mg/dL — ABNORMAL HIGH (ref 70–105)
GLUCOSE, POC: 106 mg/dL — ABNORMAL HIGH (ref 70–105)
GLUCOSE, POC: 131 mg/dL — ABNORMAL HIGH (ref 70–105)
GLUCOSE, POC: 91 mg/dL (ref 70–105)

## 2018-05-01 LAB — SYPHILIS SCREENING ALGORITHM WITH REFLEX, SERUM
RPR: NONREACTIVE
TREPONEMAL AB QUALITATIVE: NONREACTIVE

## 2018-05-01 LAB — HGA1C (HEMOGLOBIN A1C WITH EST AVG GLUCOSE)
ESTIMATED AVERAGE GLUCOSE: 91 mg/dL
HEMOGLOBIN A1C: 4.8 % (ref 4.0–5.6)

## 2018-05-01 LAB — MAGNESIUM
MAGNESIUM: 2.2 mg/dL (ref 1.6–2.6)
MAGNESIUM: 2.2 mg/dL (ref 1.6–2.6)

## 2018-05-01 LAB — SYPHILIS SCREENING ALGORITHM WITH REFLEX (TITER, TP-PA), SERUM

## 2018-05-01 MED ORDER — GABAPENTIN 300 MG CAPSULE
300.0000 mg | ORAL_CAPSULE | Freq: Three times a day (TID) | ORAL | Status: DC
Start: 2018-05-01 — End: 2018-05-01

## 2018-05-01 MED ORDER — MIDAZOLAM 1 MG/ML INJECTION SOLUTION
2.0000 mg | INTRAMUSCULAR | Status: AC
Start: 2018-05-01 — End: 2018-05-01

## 2018-05-01 MED ORDER — ENOXAPARIN 300 MG/3 ML SUBCUTANEOUS SOLUTION
50.00 mg | Freq: Two times a day (BID) | SUBCUTANEOUS | Status: DC
Start: 2018-05-01 — End: 2018-05-01

## 2018-05-01 MED ORDER — LORAZEPAM 1 MG TABLET
1.00 mg | ORAL_TABLET | Freq: Two times a day (BID) | ORAL | Status: AC
Start: 2018-05-03 — End: 2018-05-04
  Administered 2018-05-03 – 2018-05-04 (×2): 1 mg via GASTROSTOMY
  Filled 2018-05-01 (×2): qty 1

## 2018-05-01 MED ORDER — ACETAZOLAMIDE 250 MG TABLET
250.00 mg | ORAL_TABLET | Freq: Two times a day (BID) | ORAL | Status: DC
Start: 2018-05-01 — End: 2018-05-01

## 2018-05-01 MED ORDER — LIDOCAINE (PF) 10 MG/ML (1 %) INJECTION SOLUTION
INTRAMUSCULAR | Status: DC
Start: 2018-05-01 — End: 2018-05-01
  Filled 2018-05-01: qty 30

## 2018-05-01 MED ORDER — LORAZEPAM 1 MG TABLET
1.00 mg | ORAL_TABLET | Freq: Every evening | ORAL | Status: AC
Start: 2018-05-04 — End: 2018-05-05
  Administered 2018-05-04 – 2018-05-05 (×3): 1 mg via GASTROSTOMY
  Filled 2018-05-01 (×2): qty 1

## 2018-05-01 MED ORDER — ELECTROLYTE-A INTRAVENOUS SOLUTION
INTRAVENOUS | Status: DC
Start: 2018-05-01 — End: 2018-05-01

## 2018-05-01 MED ORDER — ENOXAPARIN 60 MG/0.6 ML SUBCUTANEOUS SYRINGE
0.50 mg/kg | INJECTION | Freq: Two times a day (BID) | SUBCUTANEOUS | Status: DC
Start: 2018-05-01 — End: 2018-05-19
  Administered 2018-05-01 – 2018-05-19 (×36): 50 mg via SUBCUTANEOUS
  Filled 2018-05-01 (×37): qty 0.6

## 2018-05-01 MED ORDER — FENTANYL (PF) 50 MCG/ML INJECTION SOLUTION
INTRAMUSCULAR | Status: AC
Start: 2018-05-01 — End: 2018-05-01
  Administered 2018-05-01: 50 ug via INTRAVENOUS
  Filled 2018-05-01: qty 2

## 2018-05-01 MED ORDER — LORAZEPAM 2 MG/ML INJECTION SOLUTION
2.00 mg | Freq: Three times a day (TID) | INTRAMUSCULAR | Status: AC
Start: 2018-05-01 — End: 2018-05-02
  Administered 2018-05-01 – 2018-05-02 (×3): 2 mg via INTRAVENOUS
  Filled 2018-05-01 (×3): qty 1

## 2018-05-01 MED ORDER — FENTANYL (PF) 50 MCG/ML INJECTION SOLUTION
50.0000 ug | INTRAMUSCULAR | Status: AC
Start: 2018-05-01 — End: 2018-05-01

## 2018-05-01 MED ORDER — POLYETHYLENE GLYCOL 3350 17 GRAM ORAL POWDER PACKET
17.0000 g | Freq: Every day | ORAL | Status: DC
Start: 2018-05-01 — End: 2018-05-15
  Administered 2018-05-01: 17 g via GASTROSTOMY
  Administered 2018-05-02: 0 g via GASTROSTOMY
  Administered 2018-05-03 – 2018-05-05 (×3): 17 g via GASTROSTOMY
  Administered 2018-05-06 – 2018-05-07 (×2): 0 g via GASTROSTOMY
  Administered 2018-05-08 – 2018-05-09 (×2): 17 g via GASTROSTOMY
  Administered 2018-05-10 – 2018-05-12 (×3): 0 g via GASTROSTOMY
  Administered 2018-05-13: 17 g via GASTROSTOMY
  Administered 2018-05-14: 0 g via GASTROSTOMY
  Filled 2018-05-01 (×11): qty 1

## 2018-05-01 MED ORDER — LORAZEPAM 1 MG TABLET
1.00 mg | ORAL_TABLET | Freq: Three times a day (TID) | ORAL | Status: AC
Start: 2018-05-02 — End: 2018-05-03
  Administered 2018-05-02 – 2018-05-03 (×3): 1 mg via GASTROSTOMY
  Filled 2018-05-01 (×3): qty 1

## 2018-05-01 MED ORDER — NUTRITION PROTEIN SUPPLEMENT - TUBE FEED
2.0000 | Freq: Every day | Status: DC
Start: 2018-05-01 — End: 2018-05-17
  Administered 2018-05-01 – 2018-05-17 (×16): 30 g via GASTROSTOMY

## 2018-05-01 MED ORDER — FENTANYL (PF) 50 MCG/ML INJECTION SOLUTION
100.0000 ug | Freq: Once | INTRAMUSCULAR | Status: AC | PRN
Start: 2018-05-01 — End: 2018-05-02
  Administered 2018-05-02: 50 ug via INTRAVENOUS
  Filled 2018-05-01: qty 2

## 2018-05-01 MED ORDER — INSULIN REGULAR HUMAN 100 UNIT/ML INJECTION SSIP
0.00 [IU] | INJECTION | SUBCUTANEOUS | Status: DC | PRN
Start: 2018-05-01 — End: 2018-05-16
  Administered 2018-05-03 – 2018-05-11 (×17): 2 [IU] via SUBCUTANEOUS

## 2018-05-01 MED ORDER — MIDAZOLAM 1 MG/ML INJECTION SOLUTION
INTRAMUSCULAR | Status: AC
Start: 2018-05-01 — End: 2018-05-01
  Administered 2018-05-01: 2 mg via INTRAVENOUS
  Filled 2018-05-01: qty 2

## 2018-05-01 MED ORDER — MIDAZOLAM 1 MG/ML INJECTION SOLUTION
5.0000 mg | Freq: Once | INTRAMUSCULAR | Status: DC | PRN
Start: 2018-05-01 — End: 2018-05-02

## 2018-05-01 MED ORDER — SODIUM DI- AND MONOPHOSPHATE-POTASSIUM PHOS MONOBASIC 250 MG TABLET
500.0000 mg | ORAL_TABLET | Freq: Once | ORAL | Status: AC
Start: 2018-05-01 — End: 2018-05-01
  Administered 2018-05-01: 500 mg via GASTROSTOMY
  Filled 2018-05-01: qty 2

## 2018-05-01 MED ORDER — HALOPERIDOL LACTATE 5 MG/ML INJECTION SOLUTION
5.0000 mg | Freq: Four times a day (QID) | INTRAMUSCULAR | Status: DC | PRN
Start: 2018-05-01 — End: 2018-05-15
  Administered 2018-05-03 – 2018-05-07 (×4): 5 mg via INTRAVENOUS
  Filled 2018-05-01 (×5): qty 1

## 2018-05-01 MED ORDER — GABAPENTIN 250 MG/5 ML ORAL SOLUTION
300.00 mg | Freq: Three times a day (TID) | ORAL | Status: DC
Start: 2018-05-01 — End: 2018-05-10
  Administered 2018-05-01 – 2018-05-10 (×27): 300 mg via GASTROSTOMY
  Filled 2018-05-01 (×27): qty 6

## 2018-05-01 MED ORDER — PRENATAL VIT-IRON-FOLATE TAB WRAPPER
1.0000 | ORAL_TABLET | Freq: Every day | Status: DC
Start: 2018-05-01 — End: 2018-05-19
  Administered 2018-05-01 – 2018-05-19 (×19): 1 via GASTROSTOMY
  Filled 2018-05-01 (×19): qty 1

## 2018-05-01 MED ORDER — THIAMINE HCL (VITAMIN B1) 100 MG TABLET
100.0000 mg | ORAL_TABLET | Freq: Every day | ORAL | Status: AC
Start: 2018-05-01 — End: 2018-05-05
  Administered 2018-05-01 – 2018-05-05 (×5): 100 mg via GASTROSTOMY
  Filled 2018-05-01 (×5): qty 1

## 2018-05-01 MED ORDER — ACETAZOLAMIDE 250 MG TABLET
250.00 mg | ORAL_TABLET | Freq: Two times a day (BID) | ORAL | Status: AC
Start: 2018-05-01 — End: 2018-05-01
  Administered 2018-05-01 (×2): 250 mg via GASTROSTOMY
  Filled 2018-05-01 (×2): qty 1

## 2018-05-01 MED ADMIN — ipratropium 0.5 mg-albuteroL 3 mg (2.5 mg base)/3 mL nebulization soln: RESPIRATORY_TRACT | @ 19:00:00

## 2018-05-01 MED ADMIN — fentaNYL (PF) 50 mcg/mL injection solution: INTRAVENOUS | @ 14:00:00

## 2018-05-01 MED ADMIN — nitrofurantoin 25 mg/5 mL oral suspension: GASTROSTOMY | @ 09:00:00

## 2018-05-01 MED ADMIN — senna leaf extract 176 mg/5 mL oral syrup: GASTROSTOMY | @ 09:00:00

## 2018-05-01 MED ADMIN — PREMIXED HEMODIALYSATE W/ ADDITIVES: GASTROSTOMY | @ 21:00:00

## 2018-05-01 MED ADMIN — thrombin (hum plas)-fibrinogen-Ca 800-1,200 unit/mL(1 mL x 2) top soln: INTRAVENOUS | @ 01:00:00

## 2018-05-01 MED ADMIN — amino ac-protein hydro-whey protein 10 gram-100 kcal/30 mL oral liquid: GASTROSTOMY | @ 16:00:00

## 2018-05-01 MED ADMIN — chlorhexidine gluconate 0.12 % mouthwash: ORAL | @ 21:00:00

## 2018-05-01 MED ADMIN — chlorhexidine gluconate 0.12 % mouthwash: ORAL | @ 09:00:00

## 2018-05-01 MED ADMIN — sodium chloride 0.9 % (flush) injection syringe: INTRAVENOUS | @ 14:00:00

## 2018-05-01 MED ADMIN — vasopressin 20 unit/mL intravenous solution: INTRAVENOUS | @ 13:00:00

## 2018-05-01 MED ADMIN — sodium chloride 0.9 % (flush) injection syringe: SUBCUTANEOUS | @ 21:00:00

## 2018-05-01 MED ADMIN — VANCOMYCIN IV - PHARMACIST TO DOSE PER PROTOCOL: @ 22:00:00

## 2018-05-01 MED ADMIN — lactated Ringers intravenous solution: RESPIRATORY_TRACT | @ 02:00:00

## 2018-05-01 MED ADMIN — sodium chloride 0.9 % intravenous solution: INTRAVENOUS | @ 06:00:00

## 2018-05-01 MED ADMIN — sodium chloride 0.9 % (flush) injection syringe: GASTROSTOMY | @ 16:00:00

## 2018-05-01 MED ADMIN — acetaminophen 160 mg/5 mL oral suspension: GASTROSTOMY | @ 09:00:00

## 2018-05-01 MED ADMIN — lactated Ringers intravenous solution: TOPICAL | @ 21:00:00

## 2018-05-01 NOTE — Nurses Notes (Signed)
Consented for MRI with HCS Leonia Reader  [phone]. Set up with respiratory for 0100 on 05/01/18. Bedside nurse aware.

## 2018-05-01 NOTE — Care Management Notes (Addendum)
[facility]  Care Management Initial Evaluation    Patient Name: Antonio Gillespie  Date of Birth: 10/05/1960  Sex: male  Date/Time of Admission: 04/28/2018  3:32 PM  Room/Bed: 04/A  Payor: /   Primary Care Providers:  Selinda Eon, MD, MD (General)    Pharmacy Info:   Preferred Pharmacy     None        Emergency Contact Info:   Extended Emergency Contact Information  Primary Emergency Contact: Antonio MaduroEdu Gillespie  Home Phone: [phone]  Relation: Brother  Interpreter needed? No  Secondary Emergency Contact: Antonio Gillespie  Mobile Phone: [phone]  Relation: Other    History:   Antonio Gillespie is a 58 y.o., male.    Height/Weight: 172.7 cm (5\' 8" ) / 93 kg (205 lb 0.4 oz)     LOS: 3 days   Admitting Diagnosis: Scalp hematoma [S00.03XA]    Assessment:      05/01/18 1256   Assessment Details   Assessment Type Admission   Date of Care Management Update 05/01/18   Date of Next DCP Update 05/03/18   Readmission   Is this a readmission? No   Care Management Plan   Discharge Planning Status initial meeting   Discharge plan discussed with: Health Care Surrogate   CM will evaluate for rehabilitation potential yes   Discharge Needs Assessment   Equipment Currently Used at Home none   Equipment Needed After Discharge   (TBD)   Transportation Available family or friend will provide   Referral Information   Admission Type inpatient   Address Verified verified-no changes   Arrived From home or self-care   Insurance Verified not verified   ADVANCE DIRECTIVES   Does the Patient have an Advance Directive? Yes, Patient Does Have Advance Directive for Healthcare Treatment   Type of Advance Directive Completed 1   Copy of Advance Directives in Chart? 0   Name of MPOA or Healthcare Surrogate Antonio Gillespie   Phone Number of MPOA or Healthcare Surrogate [phone]   Employment/Financial   Patient has Prescription Coverage?  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  He stated the patient used to walk to work and the grocery store.  The HCS (brother) has not seen the patient in "awhile, because I live in a different town, but I call and talk to him weekly.  I last spoke to him last week and he did not say anything was wrong other than he stopped taking his BP medications because he is bull headed."  The HCS is unsure if the patient has health insurance, he does not know what pharmacy the patient uses or if he has a PCP.    The patient will continue to be evaluated for developing discharge needs.     Case Manager: Lillia Carmel, CLINICAL CARE COORDINATOR  Phone: [phone]

## 2018-05-01 NOTE — Care Plan (Signed)
Problem: Adult Inpatient Plan of Care  Goal: Plan of Care Review  Outcome: Ongoing (see interventions/notes)  Flowsheets (Taken 05/01/2018 1309)  Plan of Care Reviewed With: patient  Note: Discharge Plan:  Undetermined at this time

## 2018-05-01 NOTE — Progress Notes (Signed)
[facility]                                                      Trauma Progress Note                 Date of Birth:  [birth year]  Date of Admission:  04/28/2018  Date of service: 05/01/2018    Antonio Gillespie, 58 y.o., male Post trauma day 3 status post Fall    Events over the last 24 hours have included:  Localizing to pain overnight  Increased vent mode to ASV due to tachypnea and desaturations    Subjective:    Patient remains intubated. Not following on exam, localizing with left upper extremity.     Objective   24 Hour Summary:    Filed Vitals:    05/01/18 0941 05/01/18 1000 05/01/18 1100 05/01/18 1130   BP:  118/84 125/78    Pulse:  (!) 110 (!) 111    Resp:  20 (!) 21    Temp:       SpO2: 96% 96% 94% 95%     Labs:  Recent Labs     04/28/18  1613 04/28/18  1711  04/29/18  0100  04/29/18  0558  04/30/18  0428 04/30/18  0514 04/30/18  1758 05/01/18  0044 05/01/18  0107 05/01/18  0836   WBC 9.0  --    < > 5.3  --   --   --  3.6*  3.6*  --   --   --  4.3  --    BANDS  --   --   --   --   --   --   --  2  --   --   --   --   --    HGB 9.4*  --    < > 6.7*  --  7.6*  --  7.6*  7.6*  --   --   --  7.9*  --    HCT 32.2*  --    < > 23.3*  --  25.6*  --  25.5*  25.5*  --   --   --  26.0*  --    SODIUM 137  --    < >  --   --   --   --  141  --  141 144  --   --    POTASSIUM 3.6  --    < >  --   --   --   --  2.7*  --  3.3* 3.8  --   --    CHLORIDE 107  --    < >  --   --   --   --  107  --  108 108  --   --    BICARBONATE  --   --    < >  --    < > 26.8*   < >  --  28.4*  --  30.9*  --  32.0*   BUN 3*  3*  --    < >  --   --   --   --  4*  --  4* 4*  --   --    CREATININE 0.72  0.72  --    < >  --   --   --   --  0.57*  --  0.52* 0.53*  --   --    GLUCOSE  --  Negative  --   --   --   --   --   --   --   --   --   --   --    ANIONGAP 9  --    < >  --   --   --   --  6  --  5 7  --   --    CALCIUM 7.3*  --    < >  --   --   --   --  7.1*  --  7.0* 7.4*  --   --    MAGNESIUM 1.7  --    < >  --   --   --   --   2.2  --  2.1 2.2  --   --    PHOSPHORUS 2.5  --    < >  --   --   --    < > 3.1  --  2.7 2.5  --   --    INR 1.39*  --   --   --   --   --   --   --   --   --   --   --   --     < > = values in this interval not displayed.       Intake/Output Summary (Last 24 hours) at 05/01/2018 1207  Last data filed at 05/01/2018 1200  Gross per 24 hour   Intake 2764 ml   Output 1040 ml   Net 1724 ml     Nutrition Management: MNT PROTOCOL FOR DIETITIAN  ADULT TUBE FEEDING - CONTINUOUS DRIP CONTINUOUS NO MEALS, TF ONLY; OSMOLITE 1.5; OG; Initial Rate (ml/hr): 10; Increase by: 0; Goal Rate (ml/hr): 30 Last Bowel Movement: 05/01/18  Recent Labs     04/28/18  1613   ALBUMIN 1.8*     Current Medications:    Current Facility-Administered Medications:   .  acetaminophen (TYLENOL) 160 mg per 5 mL liquid - grape flavored, 650 mg, Gastric (NG, OG, PEG, GT), Q4H PRN, Thimm, Terra, MD, 650 mg at 04/29/18 0316  .  acetaZOLAMIDE (DIAMOX) tablet, 250 mg, Gastric (NG, OG, PEG, GT), 2x/day, Babs Sciara, MD, 250 mg at 05/01/18 1116  .  chlorhexidine gluconate (PERIDEX) 0.12% mouthwash, 15 mL, Swish & Spit, 2x/day, Kasandra Knudsen, Victorino Dike, MD, 15 mL at 05/01/18 [phone]  .  cyanocobalamin (VITAMIN B12) 1000 mcg/mL injection, 1,000 mcg, Subcutaneous, Daily, Michaelle Birks, MD, 1,000 mcg at 05/01/18 0830  .  enoxaparin PF (LOVENOX) 60 mg/0.6 mL SubQ injection, 0.5 mg/kg, Subcutaneous, Q12H, Babs Sciara, MD  .  famotidine (PEPCID) tablet, 20 mg, Gastric (NG, OG, PEG, GT), 2x/day, Kasandra Knudsen, Victorino Dike, MD, 20 mg at 05/01/18 [phone]  .  gabapentin (NEURONTIN) 300mg  per 79mL oral liquid, 300 mg, Gastric (NG, OG, PEG, GT), 3x/day, Bardes, Ronelle, MD  .  haloperidol (HALDOL) 5 mg/mL injection, 5 mg, Intravenous, Q6H PRN, Carlynn Herald, MD  .  ipratropium-albuterol 0.5 mg-3 mg(2.5 mg base)/3 mL Solution for Nebulization, 3 mL, Nebulization, Q6H, McCoy, Skyler, MD, 3 mL at 05/01/18 0911  .  lanolin-oxyquin-pet, hydrophil (BAG BALM) topical ointment, , Apply  Topically, 2x/day PRN, Michaelle Birks, MD  .  [COMPLETED] LORazepam (ATIVAN) 2 mg/mL injection, 2 mg, Intravenous, Q8HR, 2 mg at 04/30/18 1440 **FOLLOWED BY** LORazepam (ATIVAN) 2 mg/mL injection, 2 mg, Intravenous,  Q8HR, 2 mg at 05/01/18 0604 **FOLLOWED BY** [START ON 05/02/2018] LORazepam (ATIVAN) 2 mg/mL injection, 1 mg, Intravenous, Q8HR, Thimm, Terra, MD  .  INSERT & MAINTAIN PERIPHERAL IV ACCESS, , , UNTIL DISCONTINUED **AND** DRESSING CHANGE Saline Lock; DRY STERILE, , , PRN **AND** NS flush syringe, 2 mL, Intracatheter, Q8HRS, Stopped at 04/30/18 2200 **AND** NS flush syringe, 2-6 mL, Intracatheter, Q1 MIN PRN, Dory Peru, Hansol, MD  .  polyethylene glycol (MIRALAX) oral packet, 17 g, Gastric (NG, OG, PEG, GT), Daily, Karma Greaser, MD, 17 g at 05/01/18 1610  .  senna concentrate (SENNA)  per 15mL oral liquid, 5 mL, Gastric (NG, OG, PEG, GT), 2x/day, Kasandra Knudsen, Victorino Dike, MD, 176 mg at 05/01/18 9604  .  SSIP insulin R human (HUMULIN R) 100 units/mL injection, 0-12 Units, Subcutaneous, Q4H PRN **AND** POCT WHOLE BLOOD GLUCOSE, , , Q6H, Carlynn Herald, MD  .  thiamine-vitamin B1 (BETAXIN) tablet, 100 mg, Gastric (NG, OG, PEG, GT), Daily, Babs Sciara, MD, 100 mg at 05/01/18 1116     Today's Physical Exam:  GEN:   Chronically ill appearing  HEENT:   Pupils equal but not reactive.  PULM:   Mechanically ventilated  CV:   Tachycardic  ABD:   Soft, nondistended. Left inguinal hernia extending into scrotum, reducible.   NEURO:   GCS 3T, localizing with left upper extremity. Extremities noted to be twitching following pain stimuli  Integumentary:  Feet and knees with scatter wounds, dry in nature, no surrounding erythema    Assessment/ Plan:   Active Hospital Problems   (*Primary Problem)    Diagnosis   . *Fall     "found down" and taken to outside hospital     . Thrombocytopenia Unspecified   . Hypokalemia   . Alcohol abuse   . Troponin I above reference range   . Acute respiratory failure (CMS HCC)   . Nasal  fracture     Acute vs chronic     . Abrasions of multiple sites   . Scalp hematoma   . Humeral fracture     nonop per ortho     . UTI (urinary tract infection)     At outside hospital. Repeat UA pending     . Left inguinal hernia     S/p reduction     . Anasarca     DVT prophylaxis:  Enoxaparin and SCDs/ Venodynes/Impulse boots  Anticoagulants (last 24 hours)     Date/Time Action Medication Dose    04/29/18 0827 Given    enoxaparin PF (LOVENOX) 40 mg/0.4 mL SubQ injection 40 mg    04/28/18 2020 Given    enoxaparin PF (LOVENOX) 40 mg/0.4 mL SubQ injection 40 mg        Nutrition: MNT PROTOCOL FOR DIETITIAN  ADULT TUBE FEEDING - CONTINUOUS DRIP CONTINUOUS NO MEALS, TF ONLY; OSMOLITE 1.5; OG; Initial Rate (ml/hr): 10; Increase by: 0; Goal Rate (ml/hr): 30 diet, Last Bowel Movement: 05/01/18  Activity: No limitations    Pain:   Analgesics (last 24 hours)     Date/Time Action Medication Dose    04/29/18 0316 Given    acetaminophen (TYLENOL) 160 mg per 5 mL liquid - grape flavored 650 mg    04/28/18 1720 Given    fentaNYL (SUBLIMAZE) 50 mcg/mL injection 100 mcg    04/28/18 1648 Given    fentaNYL (SUBLIMAZE) 50 mcg/mL injection 50 mcg    04/28/18 1633 Given    fentaNYL (SUBLIMAZE) 50 mcg/mL injection 100 mcg  PT Recommendations:  Pending    Plan:     Poor neurological exam   - EEG - no seizures   - NCCU consult   - CTA intra/extracranial - no significant findings   - MRI with nonspecific white matter changes    Left inguinal hernia   - Reducible at bedside   - Continue to monitor for overlying skin changes   - Will attempt daily reduction    Right humeral neck fracture   - Ortho consulted   - No operative intervention at this time   - Sling as needed    Nasal bone fracture   - ENT consult   - Will re-eval when more awake    Bilateral lower extremity wounds   - ABI/PVR   - Wound care consult    UA with moderate leuks, culture negative, nitrofurantoin given  Increase tube feeds to goal as able, currently on trickles due  to refeeding concern  Continue DVT prophylaxis  Wean vent as able  Will obtain MRI C-spine to clear collar    Will establish surrogacy with Care Management as patient will likely require trach/PEG  Contact Adult Protective Services    Gerhard MunchBrendan Manuel Dall, MD  05/01/2018, 12:07

## 2018-05-01 NOTE — Nurses Notes (Signed)
Restraint Continuation      Patient continues to have the following condition altered mental status related to acute illness.       Least restrictive alternatives attempted : Increase patient observation, bed-exit alarm/motion detector, call light accessible, concealed tubes/lines, decreased/removed stimulus, frequent orientation, involved patient in conversation, reoriented to person, place, time, and treatment, signage, encouraged relaxation techniques, redirected behavior, used TV/music and assessed meds/medical problems    Patient continues to exhibit the following behaviors: uncooperative    The restraint continued to facilitate medical/surgical treatment to ensure safety.    The patient will continue to be evaluated and assessments documented on the flowsheet to ensure that the patient is released from the restraint at the earliest possible time

## 2018-05-01 NOTE — Procedures (Signed)
[facility]   Arterial Line Placement    Procedure Date:  05/01/2018 Time:  14:30  Procedure: Arterial Line Placement  Indication: invasive BP monitoring and frequent arterial sampling    Description: A surgical time out was performed to confirm the correct patient and procedure.   The skin was prepped with chlorhexidine. A arrow catheter  was inserted in the right Radial using the Seldinger Techn[employer].  Good arterial blood was noted and then the line was connected to the transducer showing arterial wave forms.  The line was sutured in place and dressed. Patient tolerated procedure without complications.     Procedure was performed by Dr. Jorge Mandril.    Dr. [name] was available if needed during the procedure.      Karma Greaser, MD 05/01/2018, 14:51    Aware, not present    Babs Sciara, MD

## 2018-05-01 NOTE — Nurses Notes (Signed)
Restraint Continuation      Patient continues to have the following condition unable to follow directions, agitated, restless, confused.       Least restrictive alternatives attempted : Increase patient observation, moved closer to the nurses station, bed-exit alarm/motion detector, call light accessible, concealed tubes/lines, decreased/removed stimulus, reoriented to person, place, time, and treatment and redirected behavior    Patient continues to exhibit the following behaviors: uncooperative, agitated and restless, confused    The restraint continued to facilitate medical/surgical treatment to ensure safety.    The patient will continue to be evaluated and assessments documented on the flowsheet to ensure that the patient is released from the restraint at the earliest possible time

## 2018-05-01 NOTE — Progress Notes (Signed)
SICU PROGRESS NOTE    Antonio Gillespie.  Date of Admission:  04/28/2018  Date of Service: 05/01/2018  Date of Birth:  1960/03/17    Primary Attending: Harvin Hazel, Anderson Malta, *  Primary Service:  TRAUMA BLUE SIC     LOS: 3 days      Events Last 24 hours:   Patient had agitation overnight and had Ativan taper increased    Subjective:    Still intubated on Ativan taper. NAEO.     Vital Signs:  Temp (24hrs) Max:37.3 C (15.1 F)      Systolic (76HYW), VPX:106 , Min:81 , YIR:485     Diastolic (46EVO), JJK:09, Min:56, Max:101    Temp  Avg: 37 C (98.6 F)  Min: 36.7 C (98.1 F)  Max: 37.3 C (99.1 F)  MAP (Non-Invasive)  Avg: 87.3 mmHG  Min: 66 mmHG  Max: 108 mmHG  Pulse  Avg: 110.6  Min: 97  Max: 124  Resp  Avg: 23.8  Min: 14  Max: 42  SpO2  Avg: 96.1 %  Min: 90 %  Max: 100 %  Pain Score (Numeric, Faces): Other    Meds  acetaminophen (TYLENOL) 160 mg per 5 mL liquid - grape flavored, 650 mg, Gastric (NG, OG, PEG, GT), Q4H PRN  chlorhexidine gluconate (PERIDEX) 0.12% mouthwash, 15 mL, Swish & Spit, 2x/day  cyanocobalamin (VITAMIN B12) 1000 mcg/mL injection, 1,000 mcg, Subcutaneous, Daily  electrolyte-A (PLASMALYTE-A) premix infusion, , Intravenous, Continuous  enoxaparin PF (LOVENOX) 40 mg/0.4 mL SubQ injection, 40 mg, Subcutaneous, Q12H  famotidine (PEPCID) tablet, 20 mg, Gastric (NG, OG, PEG, GT), 2x/day  ipratropium-albuterol 0.5 mg-3 mg(2.5 mg base)/3 mL Solution for Nebulization, 3 mL, Nebulization, Q6H  lanolin-oxyquin-pet, hydrophil (BAG BALM) topical ointment, , Apply Topically, 2x/day PRN  LORazepam (ATIVAN) 2 mg/mL injection, 2 mg, Intravenous, Q8HR    Followed by  Derrill Memo ON 05/02/2018] LORazepam (ATIVAN) 2 mg/mL injection, 1 mg, Intravenous, Q8HR  nitrofurantoin (FURADANTIN) 10m per 553moral liquid, 50 mg, Gastric (NG, OG, PEG, GT), 4x/day  NS flush syringe, 2 mL, Intracatheter, Q8HRS    And  NS flush syringe, 2-6 mL,  Intracatheter, Q1 MIN PRN  senna concentrate (SENNA) 52853mer 82m81mal liquid, 5 mL, Gastric (NG, OG, PEG, GT), 2x/day  SSIP insulin R human (HUMULIN R) 100 units/mL injection, 0-12 Units, Subcutaneous, Q4H PRN  thiamine (VITAMIN B1) 250 mg in NS 50 mL IVPB, 250 mg, Intravenous, Q24H  [START ON 05/04/2018] thiamine-vitamin B1 (BETAXIN) tablet, 50 mg, Gastric (NG, OG, PEG, GT), 2x/day        Physical Exam:   Filed Vitals:    05/01/18 0200 05/01/18 0300 05/01/18 0400 05/01/18 0500   BP: 104/74 121/81 132/85 119/77   Pulse: (!) 113 (!) 104 (!) 110 (!) 112   Resp: _0 Temp:   37.3 C (99.1 F)    SpO2: 91% 96% 98% 96%        GEN:  Intubated, appears somewhat agitated  HEENT: Normocephalic  NECK: C-collar in place  PULM:   Lung sounds clear to auscultation bilaterally.  Normal respiratory effort.   CV:   Regular rate and rhythm  ABD:   Abdomen soft, nontender, and nondistended. L inguinal hernia  MS: All 4 extremities present, moving all 4 extremities.   NEURO:   Intubated responsive to painful stimuli, GCS 6T    Vascular:  All pulses palpable and equal bilaterally  Integumentary:  Pink, warm, and dry  PSYCHOSOCIAL: Pleasant.  Normal  affect.      Labs:  Lab Results Today:    Results for orders placed or performed during the hospital encounter of 04/28/18 (from the past 24 hour(s))   POC BLOOD GLUCOSE (RESULTS)   Result Value Ref Range    GLUCOSE, POC 106 (H) 70 - 105 mg/dl   BASIC METABOLIC PANEL   Result Value Ref Range    SODIUM 141 136 - 145 mmol/L    POTASSIUM 3.3 (L) 3.5 - 5.1 mmol/L    CHLORIDE 108 96 - 111 mmol/L    CO2 TOTAL 28 22 - 32 mmol/L    ANION GAP 5 4 - 13 mmol/L    CALCIUM 7.0 (L) 8.5 - 10.2 mg/dL    GLUCOSE 125 65 - 139 mg/dL    BUN 4 (L) 8 - 25 mg/dL    CREATININE 0.52 (L) 0.62 - 1.27 mg/dL    BUN/CREA RATIO 8 6 - 22    ESTIMATED GFR >60 >60 mL/min/1.69m2   MAGNESIUM   Result Value Ref Range    MAGNESIUM 2.1 1.6 - 2.6 mg/dL   PHOSPHORUS   Result Value Ref Range    PHOSPHORUS 2.7 2.4 - 4.7  mg/dL   POC BLOOD GLUCOSE (RESULTS)   Result Value Ref Range    GLUCOSE, POC 128 (H) 70 - 105 mg/dl   ARTERIAL BLOOD GAS/LACTATE   Result Value Ref Range    %FIO2 (ARTERIAL) 50 %    PH (ARTERIAL) 7.48 (H) 7.35 - 7.45    PCO2 (ARTERIAL) 43.0 35.0 - 45.0 mm/Hg    PO2 (ARTERIAL) 181.0 (H) 72.0 - 100.0 mm/Hg    BASE EXCESS (ARTERIAL) 7.6 (H) 0.0 - 1.0 mmol/L    BICARBONATE (ARTERIAL) 30.9 (H) 18.0 - 26.0 mmol/L    LACTATE 1.3 0.0 - 1.3 mmol/L    PAO2/FIO2 RATIO 362 <<=856  BASIC METABOLIC PANEL   Result Value Ref Range    SODIUM 144 136 - 145 mmol/L    POTASSIUM 3.8 3.5 - 5.1 mmol/L    CHLORIDE 108 96 - 111 mmol/L    CO2 TOTAL 29 22 - 32 mmol/L    ANION GAP 7 4 - 13 mmol/L    CALCIUM 7.4 (L) 8.5 - 10.2 mg/dL    GLUCOSE 112 65 - 139 mg/dL    BUN 4 (L) 8 - 25 mg/dL    CREATININE 0.53 (L) 0.62 - 1.27 mg/dL    BUN/CREA RATIO 8 6 - 22    ESTIMATED GFR >60 >60 mL/min/1.719m   MAGNESIUM   Result Value Ref Range    MAGNESIUM 2.2 1.6 - 2.6 mg/dL   PHOSPHORUS   Result Value Ref Range    PHOSPHORUS 2.5 2.4 - 4.7 mg/dL   POC BLOOD GLUCOSE (RESULTS)   Result Value Ref Range    GLUCOSE, POC 116 (H) 70 - 105 mg/dl   CBC   Result Value Ref Range    WBC 4.3 3.7 - 11.0 x10^3/uL    RBC 2.97 (L) 4.50 - 6.10 x10^6/uL    HGB 7.9 (L) 13.4 - 17.5 g/dL    HCT 26.0 (L) 38.9 - 52.0 %    MCV 87.5 78.0 - 100.0 fL    MCH 26.6 26.0 - 32.0 pg    MCHC 30.4 (L) 31.0 - 35.5 g/dL    RDW-CV 22.7 (H) 11.5 - 15.5 %    PLATELETS 117 (L) 150 - 400 x10^3/uL       I/O's:   Date 05/01/18 0000 - 05/01/18  [place] 0800-1559 1600-2359 24 Hour Total   INTAKE   I.V. 974   974     AL/PA/LA/RA/CVP/IABP/CO 24   24     Volume (electrolyte-A (PLASMALYTE-A) premix infusion) 750   750     Volume (electrolyte-A (PLASMALYTE-A) premix infusion) 200   200   OG 80   80     Intake - Volume infused (Oral Gastric Tube) 80   80   Shift Total 1054   1054   OUTPUT   Urine 245   245     Output (Foley Catheter) 245   245   Shift Total 245   245   NET 809   809                  Recent Imaging:  Results for orders placed or performed during the hospital encounter of 04/28/18 (from the past 72 hour(s))   XR AP MOBILE CHEST     Status: None    Narrative    Antonio Gillespie  Male, 58 years old.    XR AP MOBILE CHEST performed on 04/28/2018 5:21 PM.    REASON FOR EXAM:  respiratory failure    TECHNIQUE: 1 view/1 image(s) submitted for interpretation.    COMPARISON CHEST RADIOGRAPH: 04/28/2018 performed at 1200 hours on outside  facility.    FINDINGS:  The heart is enlarged. No focal consolidation, pleural effusion, or  pneumothorax is seen. Support devices appear to be in appropriate   position.  Medially displaced right surgical neck fracture is identified.      Impression    Medial displaced right surgical neck fracture and stable cardiomegaly  without acute cardiopulmonary process.     XR SHOULDER RIGHT     Status: None (Preliminary result)    Narrative    Antonio Gillespie  Male, 58 years old.    XR SHOULDER RIGHT performed on 04/28/2018 6:42 PM.    REASON FOR EXAM:  pain    TECHNIQUE: 4 views/4 images submitted for interpretation.    COMPARISON:  None available.      Impression    Anterior medially displaced right proximal humeral surgical neck fracture  is seen.     XR HUMERUS RIGHT     Status: None (Preliminary result)    Narrative    Antonio Gillespie  Male, 58 years old.    XR HUMERUS RIGHT performed on 04/28/2018 6:43 PM.    REASON FOR EXAM:  pain    TECHNIQUE: 2 views/2 images submitted for interpretation.    COMPARISON:  None available.      Impression    Proximal humeral fracture is better seen on concomitant right shoulder  radiographs. No additional fracture or dislocation involving the humerus is  detected.     XR ELBOW RIGHT     Status: None (Preliminary result)    Narrative    Antonio Gillespie  Male, 58 years old.    XR ELBOW RIGHT performed on 04/28/2018 6:45 PM.    REASON FOR EXAM:  pain    TECHNIQUE: 4 views/4 images submitted for interpretation.    COMPARISON:  None available.       Impression    Overlying soft tissue prominence and stranding without acute fracture,  dislocation, or joint effusion.     XR AP MOBILE CHEST     Status: None    Narrative    Antonio Gillespie  Male, 58 years old.    XR  AP MOBILE CHEST performed on 04/29/2018 5:35 AM.    REASON FOR EXAM:  Follow up CXR trauma pt    TECHNIQUE: 1 view/2 image(s) submitted for interpretation.    COMPARISON CHEST RADIOGRAPH: 04/28/2018.    FINDINGS:  There is bibasilar atelectasis. No pneumothorax is seen. The heart is  enlarged. Previously noted right shoulder fracture is partially   visualized.  Support devices are stable position.      Impression    Stable cardiomegaly with bibasilar atelectasis.     CT ANGIO INTRACRANIAL W/WO IV CONTRAST     Status: None    Narrative    Antonio Gillespie  Male, 58 years old.    CT ANGIO INTRACRANIAL W/WO IV CONTRAST performed on 04/29/2018 10:32 AM.    REASON FOR EXAM:  unexplained altered mental status  RADIATION DOSE: 3112.70 mGycm  CONTRAST: 50 ml's of Isovue 370    TECHNIQUE: Standard noncontrast head CT was performed with multiplanar  reformatted imaging. CT angiography was then performed of the intracranial  arterial vasculature. Multiplanar plus/minus subtracted CT angiographic  reformatted imaging was performed.    Bolus Quality :Adequate    COMPARISON: CTA extracranial performed the same time; outside PET/CT dated  04/27/2018    FINDINGS:     NON-CONTRAST HEAD CT  Evaluation is markedly limited secondary to patient cooperation and  extensive overlying EEG leads. No large masses or midline shift. No large  amount of hemorrhage. Ventricles are normal configuration and congruent  with overlying sulcal space. Mild generalized volume loss is seen. No large  focal areas of tissue loss are seen. Evaluation subtle loss of gray-white  is nondiagnostic. Right lens implant is seen. Mucosal thickening seen  within left maxillary sinus. Scattered mucosal opacifications seen within  the anterior ethmoid air  cells. Mucosal thickening also seen within the  sphenoid sinuses. Mucosal thickening and/or mucus retention cyst seen  within the frontal sinuses. Skull and scalp is unremarkable.  Redemonstration of some decreased attenuation within the centrum semiovale  on the right. This may reflect small vessel ischemic changes, but overall  this is nonspecific. No mass effect is appreciated.    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    INTRACRANIAL CTA    RIGHT Anterior Circulation    The distal cervical internal carotid artery is widely patent. The petrous  internal carotid artery is widely patent. The cavernous internal carotid  artery is patent. No aneurysms are appreciated. The supraclinoid internal  carotid artery is normal in appearance. The origin of the ophthalmic artery  is unremarkable in appearance. No abnormalities involving the origins of  the posterior communicating artery are present.     The right middle cerebral artery is patent. No evident bifurcation  aneurysms are appreciated. The proximal M2 segments are patent. The origin  of the anterior cerebral artery is patent. The A1 segment is widely patent.  No abnormalities of the anterior communicating artery complex are  demonstrated. The more distal portions of the anterior cerebral artery are  without definitive abnormalities.    LEFT Anterior Circulation    The distal cervical internal carotid artery is widely patent. The petrous  internal carotid artery is widely patent. The cavernous internal carotid  artery is patent. No aneurysms are appreciated. The supraclinoid internal  carotid artery is normal in appearance. The origin of the ophthalmic artery  is unremarkable in appearance. No abnormalities involving the origins of  the posterior communicating artery are  present.     The left middle cerebral artery is patent. No evident bifurcation aneurysms  are appreciated. The proximal M2 segments are patent. The  origin of the  anterior cerebral artery is patent. The A1 segment is widely patent. No  abnormalities of the anterior communicating artery complex are  demonstrated. The more distal portions of the anterior cerebral artery are  without definitive abnormalities.    VERTEBROBASILAR System    The intradural vertebral arteries are codominant in size. The intradural  vertebral arteries demonstrate are patent without significant stenoses. The  basilar artery is patent.    OTHER VASCULAR FINDINGS:  Minimal scattered atherosclerotic disease is seen throughout. No vascular  malformation is identified. Limited evaluation of dural sinuses is  unremarkable.       Impression    1.  Markedly limited exam secondary to overlying EEG leads and patient  cooperation. Recommend repeat examination with leads removed and/or further  evaluation with magnetic resonance imaging if concerns persist. Otherwise  no large hemorrhage, mass effect or midline shift is identified.  2.  Minimal scattered atherosclerotic disease without any definite filling  defect, high-grade stenosis or LVO identified.     CT ANGIO CAROTID-EXTRACRANIAL (NECK) W IV CONTRAST     Status: None    Narrative    Antonio Gillespie  Male, 58 years old.    CT ANGIO CAROTID-EXTRACRANIAL (NECK) W IV CONTRAST performed on 04/29/2018  10:59 AM.    REASON FOR EXAM:  unexplainted AMS  RADIATION DOSE: 272.80 mGycm  CONTRAST: 50 ml's of Isovue 370    TECHNIQUE: Standard CT angiographic acquisition was performed from the  aortic arch through the skull base. Multiplane are reformatted  reconstructions were performed.    Bolus Quality :Adequate    COMPARISON: CTA intracranial performed the same time.    FINDINGS:   Aortic Arch / [place] Origins: There is a right-sided aortic arch with  aberrant origin of the left subclavian artery from a diverticulum of  [place]. Minimal atherosclerotic disease seen within. Origins the great  vessels are widely patent. No central pulmonary embolus.  Prominence of the  central pulmonary arteries are seen which can be seen with pulmonary artery  hypertension.    VERTEBROBASILAR SYSTEM    Vertebral Artery Dominance:The right vertebral artery is dominant with  respect to size within the neck.    Left Vertebral Artery:  The left vertebral artery origin arises from the left common carotid artery  and is widely patent. Left vertebral artery distal to the origin  demonstrates normal course and caliber throughout the neck and it's  intradural portion.    Right Vertebral Artery:  The right vertebral artery arises from its respective subclavian artery  with suggestion of mild stenosis at its origin but evaluation is mildly  limited secondary to technique. Distally within the high V1 segment and  proximal V2 segment this artery is not well opacified. Distal V2 segments  are well opacified.    CAROTID SYSTEM    RIGHT Common Carotid Artery: The origin of the right common carotid artery  is patent. There is normal course and caliber of the common carotid artery  to the level of the bifurcation.    RIGHT Carotid Bifurcation/ICA:Right carotid bifurcation is widely patent.   The right cervical internal carotid artery is patent. No evident stenoses  are appreciated.    LEFT CAROTID SYSTEM    LEFT Common Carotid Artery:The origin of the left common carotid artery is  patent. There  is normal course and caliber of the left common carotid  artery to the level of the bifurcation.    LEFT Carotid Bifurcation/ICA: The left carotid bifurcation is unremarkable  in appearance.   The left cervical internal carotid artery is patent. No evident stenoses  are appreciated.    Non-Findings: No gross cervical malalignment. Partial redemonstration of  prominent focal dextroscoliosis of upper thoracic spine secondary to  right-sided hemivertebra that appears between the T3 and T4 levels better  seen on prior chest CT dated 04/27/2018. Enteric tube is seen within the  esophagus. Patient is intubated.  Minimal atelectasis. A few nonspecific  groundglass opacities are noted. Motion and technique limits evaluation of  lung is better characterized on prior CT chest performed 04/27/2018.      Impression    1.  Right-sided aortic arch with aberrant left subclavian artery.  2.  Suggestion of mild stenosis at the origin of right vertebral artery  along with limited opacification seen within distal right vertebral artery  V1 and proximal V2 segments. These findings may be exacerbated by technique  and patient's underlying anatomy. Otherwise superior segments of bilateral  vertebral arteries are well-opacified. Visualized vertebrobasilar system is  mildly diminutive.  3.  Otherwise mild scattered atherosclerotic disease without other evidence  of high-grade stenosis or filling defect identified.  4.  Dilated main pulmonary artery which can be seen with pulmonary artery  hypertension.     MRI BRAIN W/WO CONTRAST     Status: None    Narrative    Antonio Gillespie  Male, 58 years old.    MRI BRAIN W/WO CONTRAST performed on 04/29/2018 11:47 PM.    REASON FOR EXAM:  Continued AMS of unknown origin      INTRAVENOUS CONTRAST: 9 ml's of Gadavist  CREATININE/GFR: Creat:0.18m/dL on 04/29/18, GFR >60    TECHNIQUE: Sagittal T1, axial T2, axial T2-weighted FLAIR, axial T1, axial  diffusion, axial susceptibility/gradient-echo imaging was performed. MR  perfusion imaging was performed. Axial and volumetric postcontrast whole  brain imaging was performed.    COMPARISON: CTA intracranial performed 04/29/2018.    FINDINGS: The pituitary and corpus callosum are unremarkable in appearance.  The cerebellum is normally formed. Overall, there is parenchymal volume  loss greater than expected for the patient's age. 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 There are scattered areas of  increased signal on T2-weighted imaging within the cerebral deep white  matter in a nonspecific distribution.  No diffusion abnormalities  are present to suggest an acute infarct. There  is a single focus of signal dropout on gradient-echo imaging within the  right thalamus suggestive of a prior microhemorrhage.    No unexpected extra-axial fluid collections are appreciated. There are  sinonasal inflammatory changes. There is scalp swelling over the left  hemisphere.    Postcontrast whole brain imaging is somewhat limited by patient motion, but  demonstrates no definitive abnormalities.      Impression     1. No acute infarcts or intracranial mass lesions are appreciated.    2. White matter signal changes greater than expected for the patient's age,  but in a nonspecific distribution.    3. Remote microhemorrhage within the right thalamus and a remote lacunar  infarct within the right cerebellum.     XR AP MOBILE CHEST     Status: None    Narrative    JJearld Gillespie Male, 58years old.    XR AP MOBILE CHEST  performed on 04/30/2018 5:52 AM.    REASON FOR EXAM:  Follow up CXR trauma pt    TECHNIQUE: 1 view/1 image(s) submitted for interpretation.    COMPARISON CHEST RADIOGRAPH: 04/29/2018.    FINDINGS:  Stable cardiomegaly is noted. There is bibasilar atelectasis. No  pneumothorax or pleural effusion is identified. Support devices are stable  position.      Impression    Stable cardiomegaly and bibasilar atelectasis.         Assessment/ Plan:  Active Hospital Problems    Diagnosis   . Primary Problem: Fall   . Thrombocytopenia Unspecified   . Hypokalemia   . Alcohol abuse   . Troponin I above reference range   . Acute respiratory failure (CMS HCC)   . Nasal fracture   . Abrasions of multiple sites   . Scalp hematoma   . Humeral fracture   . UTI (urinary tract infection)   . Left inguinal hernia   . McNary Jacober is a 58 y.o. male with PMH of atrial fibrillation, COPD, EtOH abuse who was transferred from outside facility. Per chart review, family of patient had not heard from him and called the police who found him in his home lying face  down and covered in urine/feces.Patient was intubated at outside facility ED. Pan CT scan revealed a comminuted right humeral neck fracture, pulmonary artery enlargement, large left inguinal hernia. CT head showed bilateral scalp hematomas. CT c-spine negative. XR right hand and forearm were negative for fractures    Lines:   Patient Lines/Drains/Airways Status    Active Line / Dialysis Catheter / Dialysis Graft / Drain / Airway / Wound     Name: Placement date: Placement time: Site: Days:    Peripheral IV Left Median Cubital  (antecubital fossa)  -   -       Central Triple Lumen Right Femoral  04/28/18   1800   2    Arterial Line Right Femoral  04/28/18   1650   Femoral  2    Oral Gastric Tube  04/28/18   1600   2    Foley Catheter  04/28/18   1615   2    EndoTracheal Tube Oral 7.5 23 cm Lip  -   -       Wound (Non-Surgical) Left Chest  04/28/18   1600   2    Wound (Non-Surgical) Left;Right Toe  04/28/18   1600   2    Wound (Non-Surgical) Left Mouth  04/28/18   1600   2    Wound (Non-Surgical) Left Knee  04/28/18   1600   2    Wound (Non-Surgical) Right;Medial Foot  04/28/18   1600   2                   NEURO:  GCS: E1=None (Does Not Open Eyes) M4=Withdraws To Pain V1=None (Makes No Noise or Intubated)T   CTA Imaging demonstrated mild stenosis at origin of right vertebral artery and atherosclerosis without evidence of traumatic injury  MRI brain showed no actue infarcts or intracranial mass. 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   Sedation/analgesia: Ativan Taper 11m Q8hrs x 5 and then 175mQ8hr x 6 starting on 3/17 and ending on 3/19 for suspected alcohol withdrawal, reported patient drinks approximately 30 beers/day  IV and PO Thiamine  EEG negative     CARDIOVASCULAR:  Systolic (2440HKV  Avg:110 , Min:81 , KDT:267     Diastolic (12WPY), KDX:83, Min:56, Max:101  HR 96-124     ART-Line  MAP: 84 mmHg   Troponins: No results found for: CKMB   Meds: none  Pressors:  none        PULMONARY:  Airway Ventilator Settings   EndoTracheal Tube Oral 7.5 23 cm Lip (Active)   Airway Secure Device 05/01/2018  4:18 AM   Position Change Yes 05/01/2018  4:18 AM   Change Reason Routine 05/01/2018  4:18 AM   Bilateral General Breath Sounds Coarse 04/28/2018  3:51 PM    Ventilator Settings:  Mode: ASV  Set PEEP: 8 cmH2O  Pressure Support: 10 cmH2O  FiO2: 40 %  I:E Ratio: 1:1.1     Weaning Parameters: Min. Volume: 5.2 Liters (04/29/18 1234)  Resp Rate: 18 Breath Per Minute (04/29/18 1234)  Avg. VT: 288.89 mL (04/29/18 1234)  FVC: (UTA) (04/29/18 1234)  NIF: -30 cmH2O (04/29/18 1234)  RSBI: 62 (04/29/18 1234)  Cuff Leaked Assessment: Leak Present;Good Airflow Aucustulated (04/29/18 1234)  SpO2  Avg: 96.1 %  Min: 90 %  Max: 100 %  Blood Gas:  Recent Labs     05/01/18  0044   FI02 50   PH 7.48*   PCO2 43.0   PO2 181.0*   BICARBONATE 30.9*   BASEEXCESS 7.6*     Nebs: none  Imaging: CXR is slightly improved compared to previous      GI:  MNT PROTOCOL FOR DIETITIAN  ADULT TUBE FEEDING - CONTINUOUS DRIP CONTINUOUS NO MEALS, TF ONLY; OSMOLITE 1.5; OG; Initial Rate (ml/hr): 10; Increase by: 0; Goal Rate (ml/hr): 10   Recent Labs     04/28/18  1613 04/28/18  1711 04/28/18  2016   BILIRUBIN  --  Small*  --    ALBUMIN 1.8*  --   --    LIPASE  --   --  6*     Last BM: Last Bowel Movement: 04/30/18(per report )  Proph: Senokot, Miralax Pepcid    RENAL/GU:  Recent Labs     04/28/18  1711  04/29/18  0819  04/30/18  0428 04/30/18  0514 04/30/18  1758 05/01/18  0044   SODIUM  --    < >  --   --  141  --  141 144   POTASSIUM  --    < >  --   --  2.7*  --  3.3* 3.8   CHLORIDE  --    < >  --   --  107  --  108 108   BICARBONATE  --    < > 28.0*  --   --  28.4*  --  30.9*   BUN  --    < >  --   --  4*  --  4* 4*   CREATININE  --    < >  --   --  0.57*  --  0.52* 0.53*   GLUCOSE Negative  --   --   --   --   --   --   --    ANIONGAP  --    < >  --   --  6  --  5 7   CALCIUM  --    < >  --   --  7.1*  --  7.0* 7.4*      MAGNESIUM  --    < >  --   --  2.2  --  2.1 2.2   PHOSPHORUS  --    < >  --    < > 3.1  --  2.7 2.5    < > = values in this interval not displayed.       I/O last 24 hours:      Intake/Output Summary (Last 24 hours) at 05/01/2018 0601  Last data filed at 05/01/2018 0600  Gross per 24 hour   Intake 2766 ml   Output 1025 ml   Net 1741 ml     I/O current shift:  03/15 1900 - 03/16 0659  In: 2549 [I.V.:1413; OG:220]  Out: 445 [Urine:445]  Foley in critically ill pt for strict I/O's  IV fluids: Plasmalyte @ 125 ml/hr  Diuretics:none    HEME:  Recent Labs     04/28/18  1613  04/29/18  0100 04/29/18  0558 04/30/18  0428 05/01/18  0107   HGB 9.4*   < > 6.7* 7.6* 7.6*  7.6* 7.9*   HCT 32.2*   < > 23.3* 25.6* 25.5*  25.5* 26.0*   PLTCNT 185   < > 128*  --  94*  94* 117*   APTT 27.8  --   --   --   --   --    INR 1.39*  --   --   --   --   --     < > = values in this interval not displayed.     Transfusions:   Proph: SCD's, Lovenox    ID:  Temp (24hrs) Max:37.3 C (99.1 F)    Recent Labs     04/28/18  1613  04/29/18  0100 04/30/18  0428 05/01/18  0107   WBC 9.0   < > 5.3 3.6*  3.6* 4.3   PMNS 79  --  97 88  --    BANDS  --   --   --  2  --     < > = values in this interval not displayed.     Blood cultures: NGTD  Urine cultures:  NGTD  BAL: Staphylococcus species, MRSA negative  OR cultures:  none  ABX: Nitrofurantoin for UTI (stop date 05/01/2018)    ENDO:  Recent Labs     04/29/18  0615 04/29/18  1145 04/29/18  1625 04/29/18  2232 04/30/18  0441 04/30/18  0939 04/30/18  1806 05/01/18  0106   GLUIP 98 68* 89 86 89 106* 128* 116*     SSI    MSK:  No skin breakdown  ENT consulted for nasal bone fractures, non-operative   Orthopedics consulted for proximal humerus fracture, likely non operative    OTHER:  Activity: Bedrest, HOB 30deg  PT/OT:  ordered  MNT:  ordered    PLAN:  Neuro: Continue ativan taper, Thiamine supplementation. Add Haldol PRN and Gabapentin, discuss with NCCU starting ASA  Resp: Switch vent to BiVent,  Start Diamox today for metabolic alkalosis, repeat blood gas in afternoon  Recent Labs     04/29/18  0819 04/30/18  0514 05/01/18  0044   PH 7.47* 7.44 7.48*   PCO2 38.0 43.0 43.0   PO2 133.0* 130.0* 181.0*   BASEEXCESS 3.9* 4.4* 7.6*     CVS: Maintain MAP > 65  GI: NG tube in place, cont bowel reg, famotidine for ppx. Increase tube feeds  Renal: Foley for strict I&O in critically ill patient. Check BMP, Mg, Phos in afternoon  Endocrine: SSI   Recent Labs  04/29/18  0615 04/29/18  1145 04/29/18  1625 04/29/18  2232 04/30/18  0441 04/30/18  0939 04/30/18  1806 05/01/18  0106   GLUIP 98 68* 89 86 89 106* 128* 116*     Heme: H/H  DVT ppx- SCDs, Lovenox  ID: afebrile  Other: Activity- HOB 30, OOB, PT/OT       Dimple Nanas, MD  05/01/2018, 06:01  Department of Surgery  PGY-1  Pager [phone]         ______________________________________________________________________________  SICU Attending    I saw and examined the patient.  I reviewed the resident's note.  I agree with the findings and plan of care as documented in the resident's note.  Any exceptions/additions are edited/noted.    Delirium, CAM+, encephalopathy without coma, ETOH withdrawal - on ativan taper, adding gabapentin 300 TID for withdrawal symptomatology, haldol prn for agitation.   EEG negative for sezizure. 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  UTI- cultures negative, WBC normal, can d/c abx  Metabolic alkalosis - starting diamox, stop plasmalyte  Advance tube feeds to coal.   Consult wound care for lower extremity wounds   Acute respiratory failure- employ lung protective strategies. Daily SAT/SBT, wean to extubate as his mental status improves. Trial bivent today for increased recruitment  I have seen and reviewed Radiology Images CXR.  My interpretation is: bilateral atelectasis.       Critical Care Attestation    I was present at the bedside of this critically ill patient for 60 minutes exclusive of procedures.  This  patient suffers from failure or dysfunction of Neurologic/Sensory, Pulmonary, GI/Hepatopancreaticobiliary, Renal, Integumentary, Musculoskeletal, Hematologic and Immue/Allergic system(s).  The care of this patient was in regard to managing (a) conditions(s) that has a high probability of sudden, clinically significant, or life-threatening deterioration and required a high degree of Attending Physician attention and direct involvement to intervene urgently. Data review and care planning was performed in direct proximity of the patient, examination was obviously performed in direct contact with the patient. All of this time was exclusive of procedure which will be documented elsewhere in the chart.    My critical care time is independent and unique to other providers (no other providers saw patient for purposes of sicu evaluation)  My critical care time involved full attention to the patients' condition and included:    Review of nursing notes and/or old charts  Review of medications, allergies, and vital signs  Documentation time  Consultant collaboration on findings and treatment options  Care, transfer of care, and discharge plans  Ordering, interpreting, and reviewing diagnostic studies/tab tests  Obtaining necessary history from family, EMS, nursing staff and/or treating physicians    My critical care time did not include time spent teaching resident physician(s) or other services of resident physicians, or performing other reported procedures.  Total Critical Care Time: 60 minutes    Ann Held, MD

## 2018-05-01 NOTE — Consults (Signed)
[facility]  Ad[facility] Care Team Initial Consult    Antonio ScrewsMoore, Suliman E, 58 y.o. male  Date of Birth:  1960-08-09  Date of service: 05/01/2018  Encounter Start Date: 04/28/2018  Inpatient Admission Date:  04/28/2018    Information Obtained from: unable to be obtained due to encephalopathy and history reviewed via medical record  Chief Complaint: Lower Body Wound    PCP: Selinda EonPaul Hartley, MD  Consult Requested By: Amalia Greenhouserauma [facility] Service- Benancio Deedslaire Lienhauser PA-C  05/01/18 1228  IP CONSULT TO ADVANCED WOUND CARE TEAM ONE TIME Complete   Process Instructions: Please call extension [phone] from Monday-Friday 7:30am - 3:30pm.   References: ON CALL (SPOK)   Provider: (Not yet assigned)   Question Answer Comment   Reason for consult: Lower body wound    Evaluate and: WRITE ORDERS AND GIVE RECOMMENDATIONS    Is Discharge Pending This Being Completed? No            Patient is currently unable to participate in history taking or the exam due to encephalopathy. Any information regarding ROS, Past medical, surgical, family, or social history is unable to be obtained directly from the patient. Any information documented below has been obtained from the EMR. Any additional history cannot be obtained presently due to her/his medical condition. No family members are present at the time of this exam to provide additional  Medical/ Surgical or past family history.   HPI: (must include no less than 4 of the following main descriptors) Location (of pain): Quality (character of pain) Severity (minimal, mild, severe, scale or 1-10) Duration (how long has pain/sx present) Timing (when does pain/sx occur)  Context (activity at/before onset) Modifying Factors (what makes pain/sx  Better/worse) Associate Sign/Sx (what accompanies main pain/sx)     Antonio ScrewsJames E Macdonnell is a 58 y.o., White male who presents with multiple abrasions of bilateral lower extremities. His health history is limited due to encephalopathy. He has a history of COPD, atrial  fibrillation and ETOH abuse. It is reported he was checked on and found down covered in feces and urine. He was intubated and taken to an outside facility. He is found to have right humeral neck fracture, nasal bone fracture, large left inguinal hernia (reduced), bilateral scalp hematomas, and anasarca. The Advance wound care team was consulted for lower body wounds    Consult  On day 2 for lower body wound  ROS:  MUST comment on all "Abnormal" findings   ROS Review of systems was not obtained due to encephalopathy .    PAST MEDICAL/ FAMILY/ SOCIAL HISTORY:       Past Medical History:   Diagnosis Date   . Atrial fibrillation (CMS HCC)    . COPD (chronic obstructive pulmonary disease) (CMS HCC)    . ETOH abuse        No Known Allergies  Medications Prior to Admission     None         acetaminophen (TYLENOL) 160 mg per 5 mL liquid - grape flavored, 650 mg, Gastric (NG, OG, PEG, GT), Q4H PRN  acetaZOLAMIDE (DIAMOX) tablet, 250 mg, Gastric (NG, OG, PEG, GT), 2x/day  chlorhexidine gluconate (PERIDEX) 0.12% mouthwash, 15 mL, Swish & Spit, 2x/day  cyanocobalamin (VITAMIN B12) 1000 mcg/mL injection, 1,000 mcg, Subcutaneous, Daily  enoxaparin PF (LOVENOX) 60 mg/0.6 mL SubQ injection, 0.5 mg/kg, Subcutaneous, Q12H  famotidine (PEPCID) tablet, 20 mg, Gastric (NG, OG, PEG, GT), 2x/day  gabapentin (NEURONTIN) 300mg  per 6mL oral liquid, 300 mg, Gastric (NG, OG,  PEG, GT), 3x/day  haloperidol (HALDOL) 5 mg/mL injection, 5 mg, Intravenous, Q6H PRN  ipratropium-albuterol 0.5 mg-3 mg(2.5 mg base)/3 mL Solution for Nebulization, 3 mL, Nebulization, Q6H  lanolin-oxyquin-pet, hydrophil (BAG BALM) topical ointment, , Apply Topically, 2x/day PRN  LORazepam (ATIVAN) 2 mg/mL injection, 2 mg, Intravenous, Q8HR    Followed by  [START ON 05/02/2018] LORazepam (ATIVAN) 2 mg/mL injection, 1 mg, Intravenous, Q8HR  NS flush syringe, 2 mL, Intracatheter, Q8HRS    And  NS flush syringe, 2-6 mL, Intracatheter, Q1 MIN PRN  polyethylene glycol (MIRALAX)  oral packet, 17 g, Gastric (NG, OG, PEG, GT), Daily  senna concentrate (SENNA)  per 15mL oral liquid, 5 mL, Gastric (NG, OG, PEG, GT), 2x/day  SSIP insulin R human (HUMULIN R) 100 units/mL injection, 0-12 Units, Subcutaneous, Q4H PRN  thiamine-vitamin B1 (BETAXIN) tablet, 100 mg, Gastric (NG, OG, PEG, GT), Daily          Family History:    Family Medical History:     None        Social History     Tobacco Use   . Smoking status: Not on file   Substance Use Topics   . Alcohol use: Not on file   . Drug use: Not on file         PHYSICAL EXAMINATION: MUST comment on all "Abnormal" findings    Exam Temperature: 36.6 C (97.9 F)  Heart Rate: (!) 111  BP (Non-Invasive): 125/78  Respiratory Rate: (!) 21  SpO2: 95 %  Pain Score (Numeric, Faces): Other  Constitutional:  acutely ill, appears older than stated age, mild distress and vital signs reviewed  Eyes:  Conjunctiva clear.  ENT:  Nose without erythema. , Mouth mucous membranes moist. , left upper lip with necrotic tissue present  Neck:  hard cervical collar on  Respiratory:  orally intubated and venitlated  Cardiovascular:  regular rate and rhythm     anasarca  Gastrointestinal:  Soft, non-tender, non-distended  Genitourinary:  indwelling foley cath is present with yellow urine  Musculoskeletal:  moving feet and hands- both with soft restraints  Integumentary:  Skin warm and dry and multiple abrasions in various tages of healing to bilateral toes and feet, bilateral knees , left thigh and left upper chest. ceullitis to left lower aram  Neurologic:  Patietn is not awake and moving all extremities but not to command  Psychiatric:  unable to assess     Wound Assessment    Abrasions of various stages of healing of bilateral lower extremeties  Right foot    Left foot      Left knee      Right knee      Left thigh      Left arm cellulitis        He also has an abrasion of his left chest near the nipple.    Labs Ordered/ Reviewed (Please indicate ordered or reviewed)      Reviewed: Labs:  Lab Results Today:    Results for orders placed or performed during the hospital encounter of 04/28/18 (from the past 24 hour(s))   BASIC METABOLIC PANEL   Result Value Ref Range    SODIUM 141 136 - 145 mmol/L    POTASSIUM 3.3 (L) 3.5 - 5.1 mmol/L    CHLORIDE 108 96 - 111 mmol/L    CO2 TOTAL 28 22 - 32 mmol/L    ANION GAP 5 4 - 13 mmol/L    CALCIUM 7.0 (L) 8.5 - 10.2  mg/dL    GLUCOSE 289 65 - 791 mg/dL    BUN 4 (L) 8 - 25 mg/dL    CREATININE 5.04 (L) 0.62 - 1.27 mg/dL    BUN/CREA RATIO 8 6 - 22    ESTIMATED GFR >60 >60 mL/min/1.71m^2   MAGNESIUM   Result Value Ref Range    MAGNESIUM 2.1 1.6 - 2.6 mg/dL   PHOSPHORUS   Result Value Ref Range    PHOSPHORUS 2.7 2.4 - 4.7 mg/dL   POC BLOOD GLUCOSE (RESULTS)   Result Value Ref Range    GLUCOSE, POC 128 (H) 70 - 105 mg/dl   ARTERIAL BLOOD GAS/LACTATE   Result Value Ref Range    %FIO2 (ARTERIAL) 50 %    PH (ARTERIAL) 7.48 (H) 7.35 - 7.45    PCO2 (ARTERIAL) 43.0 35.0 - 45.0 mm/Hg    PO2 (ARTERIAL) 181.0 (H) 72.0 - 100.0 mm/Hg    BASE EXCESS (ARTERIAL) 7.6 (H) 0.0 - 1.0 mmol/L    BICARBONATE (ARTERIAL) 30.9 (H) 18.0 - 26.0 mmol/L    LACTATE 1.3 0.0 - 1.3 mmol/L    PAO2/FIO2 RATIO 362 <=200   BASIC METABOLIC PANEL   Result Value Ref Range    SODIUM 144 136 - 145 mmol/L    POTASSIUM 3.8 3.5 - 5.1 mmol/L    CHLORIDE 108 96 - 111 mmol/L    CO2 TOTAL 29 22 - 32 mmol/L    ANION GAP 7 4 - 13 mmol/L    CALCIUM 7.4 (L) 8.5 - 10.2 mg/dL    GLUCOSE 136 65 - 438 mg/dL    BUN 4 (L) 8 - 25 mg/dL    CREATININE 3.77 (L) 0.62 - 1.27 mg/dL    BUN/CREA RATIO 8 6 - 22    ESTIMATED GFR >60 >60 mL/min/1.70m^2   MAGNESIUM   Result Value Ref Range    MAGNESIUM 2.2 1.6 - 2.6 mg/dL   PHOSPHORUS   Result Value Ref Range    PHOSPHORUS 2.5 2.4 - 4.7 mg/dL   POC BLOOD GLUCOSE (RESULTS)   Result Value Ref Range    GLUCOSE, POC 116 (H) 70 - 105 mg/dl   CBC   Result Value Ref Range    WBC 4.3 3.7 - 11.0 x10^3/uL    RBC 2.97 (L) 4.50 - 6.10 x10^6/uL    HGB 7.9 (L) 13.4 - 17.5 g/dL    HCT  93.9 (L) 68.8 - 52.0 %    MCV 87.5 78.0 - 100.0 fL    MCH 26.6 26.0 - 32.0 pg    MCHC 30.4 (L) 31.0 - 35.5 g/dL    RDW-CV 64.8 (H) 47.2 - 15.5 %    PLATELETS 117 (L) 150 - 400 x10^3/uL   POC BLOOD GLUCOSE (RESULTS)   Result Value Ref Range    GLUCOSE, POC 106 (H) 70 - 105 mg/dl   ARTERIAL BLOOD GAS/LACTATE   Result Value Ref Range    %FIO2 (ARTERIAL) 30 %    PH (ARTERIAL) 7.49 (H) 7.35 - 7.45    PCO2 (ARTERIAL) 44.0 35.0 - 45.0 mm/Hg    PO2 (ARTERIAL) 127.0 (H) 72.0 - 100.0 mm/Hg    BASE EXCESS (ARTERIAL) 9.0 (H) 0.0 - 1.0 mmol/L    BICARBONATE (ARTERIAL) 32.0 (H) 18.0 - 26.0 mmol/L    LACTATE 1.0 0.0 - 1.3 mmol/L    PAO2/FIO2 RATIO 423 <=200   HGA1C (HEMOGLOBIN A1C WITH EST AVG GLUCOSE)   Result Value Ref Range    HEMOGLOBIN A1C 4.8 4.0 - 5.6 %  ESTIMATED AVERAGE GLUCOSE 91 mg/dL   POC BLOOD GLUCOSE (RESULTS)   Result Value Ref Range    GLUCOSE, POC 131 (H) 70 - 105 mg/dl       Radiology Tests Ordered/ Reviewed (Please indicate ordered or reviewed)   Reviewed:  CXR 05/01/2018  FINDINGS:  The heart is at the left upper limits of normal for size. There  is bibasilar atelectasis. No visible pneumothorax.    Support devices:  Endotracheal tube, enteric tube, unchanged.    IMPRESSION:  Mild bibasilar atelectasis, unchanged.    ECHO 04/29/2018  Conclusions:  1. Normal left ventricular size. Mildly depressed left ventricular systolic function. LV Ejection Fraction is 49 %. Normal geometry. Abnormal diastolic function, elevated filling pressure.  2. Resting Segmental Wall Motion Analysis: Total wall motion score is 1.24. There is hypokinesis of the mid to apical inferior wall. There is hypokinesis of the basal to mid inferoseptal wall. The remaining left ventricular segments demonstrate normal wall motion. consider limited definity contrast study for better  assessment of wall motion abnormalities.  3. Mildly dilated right ventricle. Normal right ventricular systolic function.  4. Mildly dilated left atrium.  5. Mild  aortic valve regurgitation.  6. There is mild tricuspid regurgitation. RVSP = 44.5 mmHg.  7. The inferior vena cava demonstrates less than 50% collapse consistent with elevated right atrial  pressure (8 mmHg).    05/01/2018 PVR/ABI - pending    IMPRESSION:    Multiple abrasions of lower extremities- POA  Severe Protein Calorie Malnutrition    Mr. Jamarlon Hensler is a 58 year old caucasian male who was admitted to [facility] on 04/28/2018. He has abrasions of varying stages of healing of bilateral lower extremities and left upper chest. They will need cleaned with saline and Mepitel one can cover the area. The Mepitel one can be changed every 3 days. He will need his severe protein calorie malnutrition corrected. These will heal but may take time.     Recommendations (if Consult)   -Specialty Bed stay on the ICU bed  -Meticulous Hygiene to the involved area  -Clean wound with saline and apply dressing of Mepitel One to the right foot Q every 3 days and prn  -Clean wound with saline and apply dressing of Mepitel One to the bilateral knees Q every days and prn  -Clean wound with saline and apply dressing of Mepitel One to the Left thigh Q every 3 days and prn  -Preventive Measures-turn and position patient Q2 hours and prn  -Frequent repositioning when up in the chair- when stab;e  -Waffle cushion for pressure redistribution when up in the chair when stable  -Offload heels from bed with placement of pillow underneath calves  -Albumin 1.8 on 04/28/2018  -HA1C Level 4.8 on 05/01/2018  -Nutrition Services consult for Protein Malnutrition- Severe Protein Calorie Malnutrition  -Case management for home health needs and discharge planning      We thank you for the consult and will sign off.    Audree Bane, APRN,FNP-BC beeper [phone]        The patient was seen independently by the APP  Unless directly noted, I did not evaluate the patient but was accessible by phone.  Signature required by employer    Romualdo Bolk, MD

## 2018-05-01 NOTE — Care Plan (Signed)
Medical Nutrition Therapy Assessment        SUBJECTIVE : Pt on vent, family not present . Per rounds to increase TF if able, concerned with refeeding due to alcohol history    OBJECTIVE:     Current Diet Order/Nutrition Support:  MNT PROTOCOL FOR DIETITIAN  ADULT TUBE FEEDING - CONTINUOUS DRIP CONTINUOUS NO MEALS, TF ONLY; OSMOLITE 1.5; OG; Initial Rate (ml/hr): 10; Increase by: 0; Goal Rate (ml/hr): 30     Height Used for Calculations: 172.7 cm (5' 7.99")  Weight Used For Calculations: 87.5 kg (192 lb 14.4 oz)  BMI (kg/m2): 29.4  BMI Assessment: BMI 25-29.9: overweight  Ideal Body Weight (IBW) (kg): 70.87  % Ideal Body Weight: 123.47     Estimated Needs:    Energy Calorie Requirements: 2200-2600 cal per day (25-30kcals/87.5kg)  Protein Requirements (gms/day): 106-142 g prot per day (1.5-2g/70.8kg)    per day (-mLs/-kg)    Comments: 58 yo male s/p fall resulting in humerus fx, nasal fx, scalp hematoma,      Results for DEITRICH, DECLEENE (MRN [id number]) as of 05/01/2018 14:17   Ref. Range 04/30/2018 04:28 04/30/2018 17:58 05/01/2018 00:44 05/01/2018 13:27   PHOSPHORUS Latest Ref Range: 2.4 - 4.7 mg/dL 3.1 2.7 2.5 2.4       Plan/Interventions :   Continue Osmolite 1.5 at 10 ml/hr.  Per discussion during rounds, advance TF to goal due to concern for possible refeeding   Goal Tube Feed Formula : Osmolite 1.5  Goal Rate: 60 ml/hr plus 60 ml prosource/day (once tolerating change to bolus 6 cans/day)  Provides: 2280 cal = 26 cals/kg                120 g protein = 1.7 g/kg                1097 ml free water -  Continue 100 mg thiamine  Monitor weekly weights.   Will follow.       Nutrition Diagnosis: Inability to swallow related to Patient on vent as evidenced by Need for TF    Seward Speck, RD, LD, CNSC 05/01/2018, 14:21  Pager [phone]

## 2018-05-01 NOTE — Care Plan (Signed)
Patient remains a 3/4-5-1 overnight. Localizes with LUE, withdrawals in all others. One now dose of 1 mg Ativan ordered at 0000 and taper dose increased from 1 to 2 mg due to increased agitation, restlessness, inability to console. Scrotal sling in place, bag balm applied to skin, bathed, and q 2 turns throughout the night.

## 2018-05-01 NOTE — Care Plan (Signed)
Pt remains intubated, pt placed on bi-vent per Dr.Bardes , didn't tolerate, RR increased 48BPM. Pt changed to PC/peep increased 10 for worsening xray/30%/Rate 14/pc 22. Pt suctioning for moderate amts thick yellow secretions, duonebs ordered Q6 hr.

## 2018-05-01 NOTE — Respiratory Therapy (Signed)
VENTILATOR - ASV CONTINUOUS Discontinue   Duration: Until Specified    Priority: Routine       Question Answer Comment   FIO2 (%) 40    Peep(cm/H2O) 8    %MV 80            Patient was on PSV at the start of the shift. Throughout the night RR increased to 40's, saturations continuously dropped to 86%. Spoke with SICU Thimm and agreed to changed vent mode to ASV. Patient RR in mid 20's and saturations in mid 90's after the change. Patient seems to be resting better on these settings. Will continue to monitor and wean as tolerated.

## 2018-05-02 ENCOUNTER — Inpatient Hospital Stay (HOSPITAL_COMMUNITY): Payer: Medicaid Other

## 2018-05-02 DIAGNOSIS — M47812 Spondylosis without myelopathy or radiculopathy, cervical region: Secondary | ICD-10-CM

## 2018-05-02 DIAGNOSIS — M5021 Other cervical disc displacement,  high cervical region: Secondary | ICD-10-CM

## 2018-05-02 DIAGNOSIS — M50222 Other cervical disc displacement at C5-C6 level: Secondary | ICD-10-CM

## 2018-05-02 DIAGNOSIS — M419 Scoliosis, unspecified: Secondary | ICD-10-CM

## 2018-05-02 DIAGNOSIS — D62 Acute posthemorrhagic anemia: Secondary | ICD-10-CM

## 2018-05-02 DIAGNOSIS — Q7649 Other congenital malformations of spine, not associated with scoliosis: Secondary | ICD-10-CM

## 2018-05-02 DIAGNOSIS — I517 Cardiomegaly: Secondary | ICD-10-CM

## 2018-05-02 DIAGNOSIS — M50221 Other cervical disc displacement at C4-C5 level: Secondary | ICD-10-CM

## 2018-05-02 DIAGNOSIS — M4802 Spinal stenosis, cervical region: Secondary | ICD-10-CM

## 2018-05-02 LAB — BASIC METABOLIC PANEL
ANION GAP: 4 mmol/L (ref 4–13)
BUN/CREA RATIO: 12 (ref 6–22)
BUN: 6 mg/dL — ABNORMAL LOW (ref 8–25)
CALCIUM: 7.4 mg/dL — ABNORMAL LOW (ref 8.5–10.2)
CHLORIDE: 111 mmol/L (ref 96–111)
CO2 TOTAL: 26 mmol/L (ref 22–32)
CREATININE: 0.52 mg/dL — ABNORMAL LOW (ref 0.62–1.27)
ESTIMATED GFR: >60 mL/min/1.73mˆ2 (ref >60)
GLUCOSE: 143 mg/dL — ABNORMAL HIGH (ref 65–139)
POTASSIUM: 3.3 mmol/L — ABNORMAL LOW (ref 3.5–5.1)
SODIUM: 141 mmol/L (ref 136–145)

## 2018-05-02 LAB — CBC
HCT: 23.6 % — ABNORMAL LOW (ref 38.9–52.0)
HGB: 7.1 g/dL — ABNORMAL LOW (ref 13.4–17.5)
MCH: 26.5 pg (ref 26.0–32.0)
MCHC: 30.1 g/dL — ABNORMAL LOW (ref 31.0–35.5)
MCV: 88.1 fL (ref 78.0–100.0)
PLATELETS: 112 x10ˆ3/uL — ABNORMAL LOW (ref 150–400)
RBC: 2.68 x10ˆ6/uL — ABNORMAL LOW (ref 4.50–6.10)
RDW-CV: 23.4 % — ABNORMAL HIGH (ref 11.5–15.5)
WBC: 3.9 x10ˆ3/uL (ref 3.7–11.0)

## 2018-05-02 LAB — ARTERIAL BLOOD GAS/LACTATE
%FIO2 (ARTERIAL): 30 %
BASE EXCESS (ARTERIAL): 2.2 mmol/L — ABNORMAL HIGH (ref 0.0–1.0)
BICARBONATE (ARTERIAL): 26.7 mmol/L — ABNORMAL HIGH (ref 18.0–26.0)
LACTATE: 1.1 mmol/L (ref 0.0–1.3)
PAO2/FIO2 RATIO: 373 (ref <=200)
PCO2 (ARTERIAL): 34 mmHg — ABNORMAL LOW (ref 35.0–45.0)
PH (ARTERIAL): 7.48 — ABNORMAL HIGH (ref 7.35–7.45)
PO2 (ARTERIAL): 112 mmHg — ABNORMAL HIGH (ref 72.0–100.0)
PO2 (ARTERIAL): 112 mmHg — NL (ref 72.0–100.0)

## 2018-05-02 LAB — ARTERIAL BLOOD GAS
%FIO2 (ARTERIAL): 30 %
PAO2/FIO2 RATIO: 463 (ref <=200)

## 2018-05-02 LAB — POTASSIUM: POTASSIUM: 3.4 mmol/L — ABNORMAL LOW (ref 3.5–5.1)

## 2018-05-02 LAB — ARTERIAL BLOOD GAS - MLS OP ONLY
BASE DEFICIT: 0.6 mmol/L (ref 0.0–3.0)
BICARBONATE (ARTERIAL): 24.5 mmol/L (ref 18.0–26.0)
PCO2 (ARTERIAL): 43 mmHg (ref 35.0–45.0)
PH (ARTERIAL): 7.37 (ref 7.35–7.45)
PO2 (ARTERIAL): 139 mmHg — ABNORMAL HIGH (ref 72.0–100.0)

## 2018-05-02 LAB — POC BLOOD GLUCOSE (RESULTS)
GLUCOSE, POC: 114 mg/dL — ABNORMAL HIGH (ref 70–105)
GLUCOSE, POC: 127 mg/dL — ABNORMAL HIGH (ref 70–105)
GLUCOSE, POC: 139 mg/dL — ABNORMAL HIGH (ref 70–105)
GLUCOSE, POC: 145 mg/dL — ABNORMAL HIGH (ref 70–105)

## 2018-05-02 LAB — PHOSPHORUS: PHOSPHORUS: 2.5 mg/dL (ref 2.4–4.7)

## 2018-05-02 LAB — MAGNESIUM: MAGNESIUM: 2.1 mg/dL (ref 1.6–2.6)

## 2018-05-02 MED ORDER — FENTANYL (PF) 50 MCG/ML INJECTION SOLUTION
50.0000 ug | Freq: Once | INTRAMUSCULAR | Status: AC | PRN
Start: 2018-05-02 — End: 2018-05-02
  Administered 2018-05-02: 50 ug via INTRAVENOUS
  Filled 2018-05-02: qty 2

## 2018-05-02 MED ORDER — POTASSIUM CHLORIDE 10 MEQ/100ML IN STERILE WATER INTRAVENOUS PIGGYBACK
10.0000 meq | INJECTION | INTRAVENOUS | Status: DC
Start: 2018-05-02 — End: 2018-05-02

## 2018-05-02 MED ORDER — ASPIRIN 81 MG CHEWABLE TABLET
81.0000 mg | CHEWABLE_TABLET | Freq: Every day | ORAL | Status: DC
Start: 2018-05-02 — End: 2018-05-19
  Administered 2018-05-02 – 2018-05-19 (×18): 81 mg via GASTROSTOMY
  Filled 2018-05-02 (×18): qty 1

## 2018-05-02 MED ORDER — ELECTROLYTE-A INTRAVENOUS SOLUTION BOLUS
500.0000 mL | Freq: Once | INTRAVENOUS | Status: AC
Start: 2018-05-02 — End: 2018-05-02
  Administered 2018-05-02: 500 mL via INTRAVENOUS
  Administered 2018-05-02: 0 mL via INTRAVENOUS

## 2018-05-02 MED ORDER — DEXMEDETOMIDINE 400 MCG/100 ML (4 MCG/ML) IN 0.9 % SODIUM CHLORIDE IV
0.3000 ug/kg/h | INTRAVENOUS | Status: DC
Start: 2018-05-02 — End: 2018-05-05
  Administered 2018-05-02 – 2018-05-03 (×2): 0.3 ug/kg/h via INTRAVENOUS
  Administered 2018-05-03: 0.7 ug/kg/h via INTRAVENOUS
  Administered 2018-05-03 (×2): 0.2 ug/kg/h via INTRAVENOUS
  Administered 2018-05-03: 0 ug/kg/h via INTRAVENOUS
  Administered 2018-05-03: 0.3 ug/kg/h via INTRAVENOUS
  Administered 2018-05-03: 0 ug/kg/h via INTRAVENOUS
  Administered 2018-05-03: 0.5 ug/kg/h via INTRAVENOUS
  Administered 2018-05-04: 0.4 ug/kg/h via INTRAVENOUS
  Administered 2018-05-04 (×3): 0.2 ug/kg/h via INTRAVENOUS
  Filled 2018-05-02 (×5): qty 100

## 2018-05-02 MED ORDER — POTASSIUM BICARBONATE-CITRIC ACID 20 MEQ EFFERVESCENT TABLET
40.0000 meq | EFFERVESCENT_TABLET | ORAL | Status: AC
Start: 2018-05-02 — End: 2018-05-02
  Administered 2018-05-02: 40 meq via GASTROSTOMY
  Filled 2018-05-02: qty 2

## 2018-05-02 MED ADMIN — gabapentin 250 mg/5 mL oral solution: GASTROSTOMY | @ 08:00:00

## 2018-05-02 MED ADMIN — LORazepam 2 mg/mL injection solution: INTRAVENOUS | @ 14:00:00

## 2018-05-02 MED ADMIN — dexMEDEtomidine 400 mcg/100 mL (4 mcg/mL) in 0.9 % sodium chloride IV: INTRAVENOUS | @ 11:00:00

## 2018-05-02 MED ADMIN — potassium bicarbonate-citric acid 20 mEq effervescent tablet: GASTROSTOMY | @ 21:00:00

## 2018-05-02 MED ADMIN — sodium chloride 0.9 % (flush) injection syringe: INTRAVENOUS | @ 22:00:00

## 2018-05-02 MED ADMIN — ipratropium 0.5 mg-albuteroL 3 mg (2.5 mg base)/3 mL nebulization soln: RESPIRATORY_TRACT | @ 20:00:00

## 2018-05-02 MED ADMIN — sodium chloride 0.9 % (flush) injection syringe: @ 21:00:00

## 2018-05-02 MED ADMIN — sodium chloride 0.9 % (flush) injection syringe: GASTROSTOMY | @ 21:00:00

## 2018-05-02 MED ADMIN — oxytocin 20 unit/1000 mL in 0.9 % sodium chloride intravenous: INTRAVENOUS | @ 10:00:00

## 2018-05-02 MED ADMIN — sodium chloride 0.9 % (flush) injection syringe: SUBCUTANEOUS | @ 08:00:00

## 2018-05-02 MED ADMIN — sodium chloride 0.9 % (flush) injection syringe: GASTROSTOMY | @ 09:00:00

## 2018-05-02 MED ADMIN — sodium chloride 0.9 % intravenous solution: GASTROSTOMY | @ 21:00:00

## 2018-05-02 MED ADMIN — atropine 0.1 mg/mL injection syringe: GASTROSTOMY | @ 21:00:00

## 2018-05-02 MED ADMIN — sodium chloride 0.9 % (flush) injection syringe: GASTROSTOMY | @ 03:00:00

## 2018-05-02 MED ADMIN — sodium chloride 0.9 % (flush) injection syringe: SUBCUTANEOUS | @ 21:00:00

## 2018-05-02 MED ADMIN — sodium chloride 0.9 % (flush) injection syringe: GASTROSTOMY | @ 13:00:00

## 2018-05-02 MED ADMIN — oxyCODONE 5 mg tablet: GASTROSTOMY | @ 09:00:00

## 2018-05-02 NOTE — Progress Notes (Signed)
SICU PROGRESS NOTE    Antonio Gillespie  Date of Admission:  04/28/2018  Date of Service: 05/02/2018  Date of Birth:  1960-11-26    Primary Attending: Harvin Hazel, Anderson Malta, *  Primary Service:  TRAUMA BLUE SIC     LOS: 4 days      Events Last 24 hours:   DC central line and femoral arterial line,  Placed radial arterial line    Subjective:    Still intubated on Ativan taper. NAEO.     Vital Signs:  Temp (24hrs) Max:36.9 C (29.2 F)      Systolic (90RMB), OBO:996 , Min:89 , LGS:932     Diastolic (41HRV), ACQ:58, Min:62, Max:95    Temp  Avg: 36.7 C (98 F)  Min: 36.5 C (97.7 F)  Max: 36.9 C (98.4 F)  MAP (Non-Invasive)  Avg: 89.6 mmHG  Min: 76 mmHG  Max: 101 mmHG  Pulse  Avg: 100.8  Min: 86  Max: 111  Resp  Avg: 16.5  Min: 14  Max: 21  SpO2  Avg: 97.8 %  Min: 94 %  Max: 100 %  Pain Score (Numeric, Faces): Other    Meds  acetaminophen (TYLENOL) 160 mg per 5 mL liquid - grape flavored, 650 mg, Gastric (NG, OG, PEG, GT), Q4H PRN  chlorhexidine gluconate (PERIDEX) 0.12% mouthwash, 15 mL, Swish & Spit, 2x/day  enoxaparin PF (LOVENOX) 60 mg/0.6 mL SubQ injection, 0.5 mg/kg, Subcutaneous, Q12H  famotidine (PEPCID) tablet, 20 mg, Gastric (NG, OG, PEG, GT), 2x/day  fentaNYL (SUBLIMAZE) 50 mcg/mL injection, 100 mcg, Intravenous, Once PRN  gabapentin (NEURONTIN) 339m per 675moral liquid, 300 mg, Gastric (NG, OG, PEG, GT), 3x/day  haloperidol (HALDOL) 5 mg/mL injection, 5 mg, Intravenous, Q6H PRN  ipratropium-albuterol 0.5 mg-3 mg(2.5 mg base)/3 mL Solution for Nebulization, 3 mL, Nebulization, Q6H  lanolin-oxyquin-pet, hydrophil (BAG BALM) topical ointment, , Apply Topically, 2x/day PRN  LORazepam (ATIVAN) 2 mg/mL injection, 2 mg, Intravenous, Q8H    Followed by  LORazepam (ATIVAN) tablet, 1 mg, Gastric (NG, OG, PEG, GT), Q8H    Followed by  [START ON 05/03/2018] LORazepam (ATIVAN) tablet, 1 mg, Gastric (NG, OG, PEG, GT), Q12H     Followed by  [START ON 05/04/2018] LORazepam (ATIVAN) tablet, 1 mg, Gastric (NG, OG, PEG, GT), NIGHTLY  midazolam (VERSED) 1 mg/mL injection, 5 mg, Intravenous, Once PRN  NS flush syringe, 2 mL, Intracatheter, Q8HRS    And  NS flush syringe, 2-6 mL, Intracatheter, Q1 MIN PRN  nutrition protein supplement 15 g per 30 mL packet, 2 Packet, Gastric (NG, OG, PEG, GT), Daily  polyethylene glycol (MIRALAX) oral packet, 17 g, Gastric (NG, OG, PEG, GT), Daily  prenatal vitamin-iron-folic acid tablet, 1 Tab, Gastric (NG, OG, PEG, GT), Daily  senna concentrate (SENNA) 52818mer 33m4mal liquid, 5 mL, Gastric (NG, OG, PEG, GT), 2x/day  SSIP insulin R human (HUMULIN R) 100 units/mL injection, 0-12 Units, Subcutaneous, Q4H PRN  thiamine-vitamin B1 (BETAXIN) tablet, 100 mg, Gastric (NG, OG, PEG, GT), Daily        Physical Exam:   Filed Vitals:    05/02/18 0500 05/02/18 0600 05/02/18 0700 05/02/18 0736   BP: 114/75 117/78 97/76    Pulse: 95 87 98    Resp: _0 Temp:       SpO2: 100% 100% 100% 99%        GEN:  Intubated, appears somewhat agitated  HEENT: Normocephalic  NECK: C-collar in place  PULM:  Lung sounds clear to auscultation bilaterally.  Normal respiratory effort.   CV:   Regular rate and rhythm  ABD:   Abdomen soft, nontender, and nondistended. L inguinal hernia  MS: All 4 extremities present, moving all 4 extremities.   NEURO:   Intubated responsive to painful stimuli, GCS 6T  Vascular:  All pulses palpable and equal bilaterally  Integumentary:  Pink, warm, and dry  PSYCHOSOCIAL: Pleasant.  Normal affect.      Labs:  Lab Results Today:    Results for orders placed or performed during the hospital encounter of 04/28/18 (from the past 24 hour(s))   ARTERIAL BLOOD GAS/LACTATE   Result Value Ref Range    %FIO2 (ARTERIAL) 30 %    PH (ARTERIAL) 7.49 (H) 7.35 - 7.45    PCO2 (ARTERIAL) 44.0 35.0 - 45.0 mm/Hg    PO2 (ARTERIAL) 127.0 (H) 72.0 - 100.0 mm/Hg    BASE EXCESS (ARTERIAL) 9.0 (H) 0.0 - 1.0 mmol/L    BICARBONATE  (ARTERIAL) 32.0 (H) 18.0 - 26.0 mmol/L    LACTATE 1.0 0.0 - 1.3 mmol/L    PAO2/FIO2 RATIO 423 <=200   HGA1C (HEMOGLOBIN A1C WITH EST AVG GLUCOSE)   Result Value Ref Range    HEMOGLOBIN A1C 4.8 4.0 - 5.6 %    ESTIMATED AVERAGE GLUCOSE 91 mg/dL   POC BLOOD GLUCOSE (RESULTS)   Result Value Ref Range    GLUCOSE, POC 131 (H) 70 - 105 mg/dl   ARTERIAL BLOOD GAS/LACTATE   Result Value Ref Range    %FIO2 (ARTERIAL) 30 %    PH (ARTERIAL) 7.45     PCO2 (ARTERIAL) 44.0 mm/Hg    PO2 (ARTERIAL) 87.0 mm/Hg    BASE EXCESS (ARTERIAL) 5.8 mmol/L    BICARBONATE (ARTERIAL) 29.4 mmol/L    LACTATE 1.3 mmol/L    PAO2/FIO2 RATIO 264    BASIC METABOLIC PANEL   Result Value Ref Range    SODIUM 140 136 - 145 mmol/L    POTASSIUM 3.3 (L) 3.5 - 5.1 mmol/L    CHLORIDE 105 96 - 111 mmol/L    CO2 TOTAL 29 22 - 32 mmol/L    ANION GAP 6 4 - 13 mmol/L    CALCIUM 7.4 (L) 8.5 - 10.2 mg/dL    GLUCOSE 108 65 - 139 mg/dL    BUN 5 (L) 8 - 25 mg/dL    CREATININE 0.50 (L) 0.62 - 1.27 mg/dL    BUN/CREA RATIO 10 6 - 22    ESTIMATED GFR >60 >60 mL/min/1.62m2   PHOSPHORUS   Result Value Ref Range    PHOSPHORUS 2.4 2.4 - 4.7 mg/dL   MAGNESIUM   Result Value Ref Range    MAGNESIUM 2.2 1.6 - 2.6 mg/dL   POC BLOOD GLUCOSE (RESULTS)   Result Value Ref Range    GLUCOSE, POC 91 70 - 105 mg/dl   BASIC METABOLIC PANEL   Result Value Ref Range    SODIUM 141 136 - 145 mmol/L    POTASSIUM 3.3 (L) 3.5 - 5.1 mmol/L    CHLORIDE 111 96 - 111 mmol/L    CO2 TOTAL 26 22 - 32 mmol/L    ANION GAP 4 4 - 13 mmol/L    CALCIUM 7.4 (L) 8.5 - 10.2 mg/dL    GLUCOSE 143 (H) 65 - 139 mg/dL    BUN 6 (L) 8 - 25 mg/dL    CREATININE 0.52 (L) 0.62 - 1.27 mg/dL    BUN/CREA RATIO 12 6 - 22  ESTIMATED GFR >60 >60 mL/min/1.53m2   MAGNESIUM   Result Value Ref Range    MAGNESIUM 2.1 1.6 - 2.6 mg/dL   PHOSPHORUS   Result Value Ref Range    PHOSPHORUS 2.5 2.4 - 4.7 mg/dL   ARTERIAL BLOOD GAS/LACTATE   Result Value Ref Range    %FIO2 (ARTERIAL) 30 %    PH (ARTERIAL) 7.48 (H) 7.35 - 7.45    PCO2  (ARTERIAL) 34.0 (L) 35.0 - 45.0 mm/Hg    PO2 (ARTERIAL) 112.0 (H) 72.0 - 100.0 mm/Hg    BASE EXCESS (ARTERIAL) 2.2 (H) 0.0 - 1.0 mmol/L    BICARBONATE (ARTERIAL) 26.7 (H) 18.0 - 26.0 mmol/L    LACTATE 1.1 0.0 - 1.3 mmol/L    PAO2/FIO2 RATIO 373 <=200   CBC   Result Value Ref Range    WBC 3.9 3.7 - 11.0 x10^3/uL    RBC 2.68 (L) 4.50 - 6.10 x10^6/uL    HGB 7.1 (L) 13.4 - 17.5 g/dL    HCT 23.6 (L) 38.9 - 52.0 %    MCV 88.1 78.0 - 100.0 fL    MCH 26.5 26.0 - 32.0 pg    MCHC 30.1 (L) 31.0 - 35.5 g/dL    RDW-CV 23.4 (H) 11.5 - 15.5 %    PLATELETS 112 (L) 150 - 400 x10^3/uL   POC BLOOD GLUCOSE (RESULTS)   Result Value Ref Range    GLUCOSE, POC 114 (H) 70 - 105 mg/dl   POC BLOOD GLUCOSE (RESULTS)   Result Value Ref Range    GLUCOSE, POC 145 (H) 70 - 105 mg/dl       I/O's:   Date 05/02/18 0000 - 05/02/18 2359   Shift 0000-0759 0800-1559 1600-2359 24 Hour Total   INTAKE   I.V. 24   24     AL/PA/LA/RA/CVP/IABP/CO 24   24   OG 430 60  490     Intake - Volume infused (Oral Gastric Tube) 430 60  490   Shift Total 454 60  514   OUTPUT   Urine 585 125  710     Output (Foley Catheter) 585 125  710   Stool         Stool Occurrence 1 x   1 x   Shift Total 585 125  710   NET -131 -65  -196         Recent Imaging:  Results for orders placed or performed during the hospital encounter of 04/28/18 (from the past 72 hour(s))   CT ANGIO INTRACRANIAL W/WO IV CONTRAST     Status: None    Narrative    Antonio Gillespie Male, 58years old.    CT ANGIO INTRACRANIAL W/WO IV CONTRAST performed on 04/29/2018 10:32 AM.    REASON FOR EXAM:  unexplained altered mental status  RADIATION DOSE: 3112.70 mGycm  CONTRAST: 50 ml's of Isovue 370    TECHNIQUE: Standard noncontrast head CT was performed with multiplanar  reformatted imaging. CT angiography was then performed of the intracranial  arterial vasculature. Multiplanar plus/minus subtracted CT angiographic  reformatted imaging was performed.    Bolus Quality :Adequate    COMPARISON: CTA extracranial  performed the same time; outside PET/CT dated  04/27/2018    FINDINGS:     NON-CONTRAST HEAD CT  Evaluation is markedly limited secondary to patient cooperation and  extensive overlying EEG leads. No large masses or midline shift. No large  amount of hemorrhage. Ventricles are normal configuration and congruent  with overlying  sulcal space. Mild generalized volume loss is seen. No large  focal areas of tissue loss are seen. Evaluation subtle loss of gray-white  is nondiagnostic. Right lens implant is seen. Mucosal thickening seen  within left maxillary sinus. Scattered mucosal opacifications seen within  the anterior ethmoid air cells. Mucosal thickening also seen within the  sphenoid sinuses. Mucosal thickening and/or mucus retention cyst seen  within the frontal sinuses. Skull and scalp is unremarkable.  Redemonstration of some decreased attenuation within the centrum semiovale  on the right. This may reflect small vessel ischemic changes, but overall  this is nonspecific. No mass effect is appreciated.    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    INTRACRANIAL CTA    RIGHT Anterior Circulation    The distal cervical internal carotid artery is widely patent. The petrous  internal carotid artery is widely patent. The cavernous internal carotid  artery is patent. No aneurysms are appreciated. The supraclinoid internal  carotid artery is normal in appearance. The origin of the ophthalmic artery  is unremarkable in appearance. No abnormalities involving the origins of  the posterior communicating artery are present.     The right middle cerebral artery is patent. No evident bifurcation  aneurysms are appreciated. The proximal M2 segments are patent. The origin  of the anterior cerebral artery is patent. The A1 segment is widely patent.  No abnormalities of the anterior communicating artery complex are  demonstrated. The more distal portions of the anterior cerebral  artery are  without definitive abnormalities.    LEFT Anterior Circulation    The distal cervical internal carotid artery is widely patent. The petrous  internal carotid artery is widely patent. The cavernous internal carotid  artery is patent. No aneurysms are appreciated. The supraclinoid internal  carotid artery is normal in appearance. The origin of the ophthalmic artery  is unremarkable in appearance. No abnormalities involving the origins of  the posterior communicating artery are present.     The left middle cerebral artery is patent. No evident bifurcation aneurysms  are appreciated. The proximal M2 segments are patent. The origin of the  anterior cerebral artery is patent. The A1 segment is widely patent. No  abnormalities of the anterior communicating artery complex are  demonstrated. The more distal portions of the anterior cerebral artery are  without definitive abnormalities.    VERTEBROBASILAR System    The intradural vertebral arteries are codominant in size. The intradural  vertebral arteries demonstrate are patent without significant stenoses. The  basilar artery is patent.    OTHER VASCULAR FINDINGS:  Minimal scattered atherosclerotic disease is seen throughout. No vascular  malformation is identified. Limited evaluation of dural sinuses is  unremarkable.       Impression    1.  Markedly limited exam secondary to overlying EEG leads and patient  cooperation. Recommend repeat examination with leads removed and/or further  evaluation with magnetic resonance imaging if concerns persist. Otherwise  no large hemorrhage, mass effect or midline shift is identified.  2.  Minimal scattered atherosclerotic disease without any definite filling  defect, high-grade stenosis or LVO identified.     CT ANGIO CAROTID-EXTRACRANIAL (NECK) W IV CONTRAST     Status: None    Narrative    Antonio Gillespie  Male, 58 years old.    CT ANGIO CAROTID-EXTRACRANIAL (NECK) W IV CONTRAST performed on 04/29/2018  10:59 AM.    REASON  FOR EXAM:  unexplainted AMS  RADIATION DOSE: 272.80 mGycm  CONTRAST: 50 ml's of Isovue 370    TECHNIQUE: Standard CT angiographic acquisition was performed from the  aortic arch through the skull base. Multiplane are reformatted  reconstructions were performed.    Bolus Quality :Adequate    COMPARISON: CTA intracranial performed the same time.    FINDINGS:   Aortic Arch / [place] Origins: There is a right-sided aortic arch with  aberrant origin of the left subclavian artery from a diverticulum of  [place]. Minimal atherosclerotic disease seen within. Origins the great  vessels are widely patent. No central pulmonary embolus. Prominence of the  central pulmonary arteries are seen which can be seen with pulmonary artery  hypertension.    VERTEBROBASILAR SYSTEM    Vertebral Artery Dominance:The right vertebral artery is dominant with  respect to size within the neck.    Left Vertebral Artery:  The left vertebral artery origin arises from the left common carotid artery  and is widely patent. Left vertebral artery distal to the origin  demonstrates normal course and caliber throughout the neck and it's  intradural portion.    Right Vertebral Artery:  The right vertebral artery arises from its respective subclavian artery  with suggestion of mild stenosis at its origin but evaluation is mildly  limited secondary to technique. Distally within the high V1 segment and  proximal V2 segment this artery is not well opacified. Distal V2 segments  are well opacified.    CAROTID SYSTEM    RIGHT Common Carotid Artery: The origin of the right common carotid artery  is patent. There is normal course and caliber of the common carotid artery  to the level of the bifurcation.    RIGHT Carotid Bifurcation/ICA:Right carotid bifurcation is widely patent.   The right cervical internal carotid artery is patent. No evident stenoses  are appreciated.    LEFT CAROTID SYSTEM    LEFT Common Carotid Artery:The origin of the left common  carotid artery is  patent. There is normal course and caliber of the left common carotid  artery to the level of the bifurcation.    LEFT Carotid Bifurcation/ICA: The left carotid bifurcation is unremarkable  in appearance.   The left cervical internal carotid artery is patent. No evident stenoses  are appreciated.    Non-Findings: No gross cervical malalignment. Partial redemonstration of  prominent focal dextroscoliosis of upper thoracic spine secondary to  right-sided hemivertebra that appears between the T3 and T4 levels better  seen on prior chest CT dated 04/27/2018. Enteric tube is seen within the  esophagus. Patient is intubated. Minimal atelectasis. A few nonspecific  groundglass opacities are noted. Motion and technique limits evaluation of  lung is better characterized on prior CT chest performed 04/27/2018.      Impression    1.  Right-sided aortic arch with aberrant left subclavian artery.  2.  Suggestion of mild stenosis at the origin of right vertebral artery  along with limited opacification seen within distal right vertebral artery  V1 and proximal V2 segments. These findings may be exacerbated by technique  and patient's underlying anatomy. Otherwise superior segments of bilateral  vertebral arteries are well-opacified. Visualized vertebrobasilar system is  mildly diminutive.  3.  Otherwise mild scattered atherosclerotic disease without other evidence  of high-grade stenosis or filling defect identified.  4.  Dilated main pulmonary artery which can be seen with pulmonary artery  hypertension.     MRI BRAIN W/WO CONTRAST     Status: None  Narrative    Antonio Gillespie  Male, 58 years old.    MRI BRAIN W/WO CONTRAST performed on 04/29/2018 11:47 PM.    REASON FOR EXAM:  Continued AMS of unknown origin      INTRAVENOUS CONTRAST: 9 ml's of Gadavist  CREATININE/GFR: Creat:0.64m/dL on 04/29/18, GFR >60    TECHNIQUE: Sagittal T1, axial T2, axial T2-weighted FLAIR, axial T1, axial  diffusion, axial  susceptibility/gradient-echo imaging was performed. MR  perfusion imaging was performed. Axial and volumetric postcontrast whole  brain imaging was performed.    COMPARISON: CTA intracranial performed 04/29/2018.    FINDINGS: The pituitary and corpus callosum are unremarkable in appearance.  The cerebellum is normally formed. Overall, there is parenchymal volume  loss greater than expected for the patient's age. 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 There are scattered areas of  increased signal on T2-weighted imaging within the cerebral deep white  matter in a nonspecific distribution.  No diffusion abnormalities are present to suggest an acute infarct. There  is a single focus of signal dropout on gradient-echo imaging within the  right thalamus suggestive of a prior microhemorrhage.    No unexpected extra-axial fluid collections are appreciated. There are  sinonasal inflammatory changes. There is scalp swelling over the left  hemisphere.    Postcontrast whole brain imaging is somewhat limited by patient motion, but  demonstrates no definitive abnormalities.      Impression     1. No acute infarcts or intracranial mass lesions are appreciated.    2. White matter signal changes greater than expected for the patient's age,  but in a nonspecific distribution.    3. Remote microhemorrhage within the right thalamus and a remote lacunar  infarct within the right cerebellum.     XR AP MOBILE CHEST     Status: None    Narrative    Antonio Gillespie Male, 58years old.    XR AP MOBILE CHEST performed on 04/30/2018 5:52 AM.    REASON FOR EXAM:  Follow up CXR trauma pt    TECHNIQUE: 1 view/1 image(s) submitted for interpretation.    COMPARISON CHEST RADIOGRAPH: 04/29/2018.    FINDINGS:  Stable cardiomegaly is noted. There is bibasilar atelectasis. No  pneumothorax or pleural effusion is identified. Support devices are stable  position.      Impression    Stable cardiomegaly and bibasilar  atelectasis.     XR AP MOBILE CHEST     Status: None    Narrative    Antonio Gillespie Male, 58years old.    XR AP MOBILE CHEST performed on 05/01/2018 5:16 AM.    REASON FOR EXAM:  Follow up CXR trauma pt    TECHNIQUE: 1 views/1 images submitted for interpretation.    COMPARISON:  04/30/2018    FINDINGS:  The heart is at the left upper limits of normal for size. There  is bibasilar atelectasis. No visible pneumothorax.    Support devices:  Endotracheal tube, enteric tube, unchanged.      Impression    Mild bibasilar atelectasis, unchanged.         Assessment/ Plan:  Active Hospital Problems    Diagnosis   . Primary Problem: Fall   . Thrombocytopenia Unspecified   . Hypokalemia   . Alcohol abuse   . Troponin I above reference range   . Acute respiratory failure (CMS HCC)   . Nasal fracture   . Abrasions of multiple sites   .  Scalp hematoma   . Humeral fracture   . UTI (urinary tract infection)   . Left inguinal hernia   . Anasarca       AUDREY ELLER is a 58 y.o. male with PMH of atrial fibrillation, COPD, EtOH abuse who was transferred from outside facility. Per chart review, family of patient had not heard from him and called the police who found him in his home lying face down and covered in urine/feces.Patient was intubated at outside facility ED. Pan CT scan revealed a comminuted right humeral neck fracture, pulmonary artery enlargement, large left inguinal hernia. CT head showed bilateral scalp hematomas. CT c-spine negative. XR right hand and forearm were negative for fractures    Lines:   Patient Lines/Drains/Airways Status    Active Line / Dialysis Catheter / Dialysis Graft / Drain / Airway / Wound     Name: Placement date: Placement time: Site: Days:    Peripheral IV Ultrasound guided;Extended dwell catheter Right Forearm  05/01/18   1100   less than 1    Peripheral IV Ultrasound guided;Extended dwell catheter Left;Lower Forearm  05/01/18   1400   less than 1    Peripheral IV Extended dwell  catheter;Ultrasound guided Left Forearm  05/01/18   1400   less than 1    Arterial Line Right Radial  05/01/18   1425   Radial  less than 1    Oral Gastric Tube  04/28/18   1600   3    Foley Catheter  04/28/18   1615   3    EndoTracheal Tube Oral 7.5 23 cm Lip  -   -       Wound (Non-Surgical) Left Chest  04/28/18   1600   3    Wound (Non-Surgical) Left;Right Toe  04/28/18   1600   3    Wound (Non-Surgical) Left Mouth  04/28/18   1600   3    Wound (Non-Surgical) Left;Right Knee  04/28/18   1600   3                   NEURO:  GCS: E4=Spontaneous (Opens Eyes on Own) M4=Withdraws To Pain V1=None (Makes No Noise or Intubated)T   CTA Imaging demonstrated mild stenosis at origin of right vertebral artery and atherosclerosis without evidence of traumatic injury  MRI brain showed no actue infarcts or intracranial mass. 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   Sedation/analgesia: Ativan Taper 59m Q8hrs x 5 and then 155mQ8hr x 6 starting on 3/17 and ending on 3/19 for suspected alcohol withdrawal, reported patient drinks approximately 30 beers/day  IV and PO Thiamine  EEG negative     CARDIOVASCULAR:  Systolic (2455DDU AvKGU:542 Min:89 , MaHCW:237   Diastolic (2462GBT AvDVV:61Min:62, Max:95  HR 96-124     ART-Line  MAP: 79 mmHg   Troponins: No results found for: CKMB   Meds: none  Pressors: none        PULMONARY:    Airway Ventilator Settings   EndoTracheal Tube Oral 7.5 23 cm Lip (Active)   Airway Secure Device 05/01/2018  4:18 AM   Position Change Yes 05/01/2018  4:18 AM   Change Reason Routine 05/01/2018  4:18 AM   Bilateral General Breath Sounds Coarse 04/28/2018  3:51 PM    Ventilator Settings:  Mode: P-CMV  Set Rate: 14 Breaths Per Minute  Set PEEP: 10 cmH2O  PC Set: 18 cmH2O  FiO2: 30 %  I:E Ratio: 1:2.9     Weaning Parameters: Min. Volume: 5.2 Liters (04/29/18 1234)  Resp Rate: 18 Breath Per Minute (04/29/18 1234)  Avg. VT: 288.89 mL (04/29/18 1234)  FVC: (UTA) (04/29/18  1234)  NIF: -30 cmH2O (04/29/18 1234)  RSBI: 62 (04/29/18 1234)  Cuff Leaked Assessment: Leak Present;Good Airflow Aucustulated (04/29/18 1234)  SpO2  Avg: 97.8 %  Min: 94 %  Max: 100 %  Blood Gas:  Recent Labs     05/01/18  0836 05/01/18  1327 05/02/18  0033   FI02 _0 PH 7.49* 7.45 7.48*   PCO2 44.0 44.0 34.0*   PO2 127.0* 87.0 112.0*   BICARBONATE 32.0* 29.4 26.7*   BASEEXCESS 9.0* 5.8 2.2*     Nebs: none  Imaging: ordered CXR       GI:  MNT PROTOCOL FOR DIETITIAN  ADULT TUBE FEEDING - CONTINUOUS DRIP CONTINUOUS NO MEALS, TF ONLY; OSMOLITE 1.5; OG; Initial Rate (ml/hr): 20; Increase by: 10 ml every 6 hours; Goal Rate (ml/hr): 60   No results for input(s): PREALBUMIN, BILIRUBIN, ALBUMIN, AMYLASE, LIPASE in the last 72 hours.    Invalid input(s): PHOSPHATASE, TRANSAMINASE  Last BM: Last Bowel Movement: 05/02/18  Proph: Senokot, Miralax Pepcid    RENAL/GU:  Recent Labs     05/01/18  0044 05/01/18  0836 05/01/18  1327 05/02/18  0033   SODIUM 144  --  140 141   POTASSIUM 3.8  --  3.3* 3.3*   CHLORIDE 108  --  105 111   BICARBONATE 30.9* 32.0* 29.4 26.7*   BUN 4*  --  5* 6*   CREATININE 0.53*  --  0.50* 0.52*   ANIONGAP 7  --  6 4   CALCIUM 7.4*  --  7.4* 7.4*   MAGNESIUM 2.2  --  2.2 2.1   PHOSPHORUS 2.5  --  2.4 2.5       I/O last 24 hours:      Intake/Output Summary (Last 24 hours) at 05/02/2018 0808  Last data filed at 05/02/2018 0800  Gross per 24 hour   Intake 1204 ml   Output 2035 ml   Net -831 ml     I/O current shift:  03/17 0700 - 03/17 1859  In: 113 [I.V.:3; OG:110]  Out: 250 [Urine:250]  Foley in critically ill pt for strict I/O's  IV fluids: none   Diuretics:none    HEME:  Recent Labs     04/30/18  0428 05/01/18  0107 05/02/18  0033   HGB 7.6*  7.6* 7.9* 7.1*   HCT 25.5*  25.5* 26.0* 23.6*   PLTCNT 94*  94* 117* 112*     Transfusions:   Proph: SCD's, Lovenox    ID:  Temp (24hrs) Max:36.9 C (98.4 F)    Recent Labs     04/30/18  0428 05/01/18  0107 05/02/18  0033   WBC 3.6*  3.6* 4.3 3.9   PMNS 88   --   --    BANDS 2  --   --      Blood cultures: NGTD  Urine cultures:  NGTD  BAL: Staphylococcus species, MRSA negative  OR cultures:  none  ABX: Nitrofurantoin for UTI (stop date 05/01/2018)    ENDO:  Recent Labs     04/30/18  0939 04/30/18  1806 05/01/18  0106 05/01/18  9629 05/01/18  1126 05/01/18  1725 05/02/18  0043 05/02/18  0607   GLUIP 106* 128*  116* 106* 131* 91 114* 145*     SSI    MSK:  No skin breakdown  ENT consulted for nasal bone fractures, non-operative   Orthopedics consulted for proximal humerus fracture, likely non operative    OTHER:  Activity: Bedrest, HOB 30deg  PT/OT:  ordered  MNT:  ordered    PLAN:  Neuro: Continue ativan taper, Thiamine supplementation. Haldol PRN and Gabapentin. Avoid versed. Start daily 81 mg ASA  Resp: Spontaneous PS, wean to extubate. DC Diamox   Recent Labs     05/01/18  0836 05/01/18  1327 05/02/18  0033   PH 7.49* 7.45 7.48*   PCO2 44.0 44.0 34.0*   PO2 127.0* 87.0 112.0*   BASEEXCESS 9.0* 5.8 2.2*     CVS: Maintain MAP > 65  GI: NG tube in place, cont bowel reg, famotidine for ppx. Continue tube feeds  Renal: Foley for strict I&O in critically ill patient.   Recent Labs     04/30/18  0939 04/30/18  1806 05/01/18  0106 05/01/18  0611 05/01/18  1126 05/01/18  1725 05/02/18  0043 05/02/18  0607   GLUIP 106* 128* 116* 106* 131* 91 114* 145*     Heme: H/H  DVT ppx- SCDs, Lovenox  ID: afebrile  Other: Activity- HOB 30, OOB, PT/OT       Dimple Nanas, MD  05/01/2018, 06:01  Department of Surgery  PGY-1  Pager [phone]      ______________________________________________________________________________  SICU Attending    I saw and examined the patient.  I reviewed the resident's note.  I agree with the findings and plan of care as documented in the resident's note.  Any exceptions/additions are edited/noted.    Adding precedex for management of delirium and agitation  Acute respiratory failure- wean to spontaneous today. SBT later  I have seen and reviewed Radiology Images  CXR.  My interpretation is: stable lung aeration, no consolidation.  Adding ASA for infarcts on MRI , NCCU recs pending      Critical Care Attestation    I was present at the bedside of this critically ill patient for 38 minutes exclusive of procedures.  This patient suffers from failure or dysfunction of Neurologic/Sensory, Pulmonary and GI/Hepatopancreaticobiliary system(s).  The care of this patient was in regard to managing (a) conditions(s) that has a high probability of sudden, clinically significant, or life-threatening deterioration and required a high degree of Attending Physician attention and direct involvement to intervene urgently. Data review and care planning was performed in direct proximity of the patient, examination was obviously performed in direct contact with the patient. All of this time was exclusive of procedure which will be documented elsewhere in the chart.    My critical care time is independent and unique to other providers (no other providers saw patient for purposes of sicu evaluation)  My critical care time involved full attention to the patients' condition and included:    Review of nursing notes and/or old charts  Review of medications, allergies, and vital signs  Documentation time  Consultant collaboration on findings and treatment options  Care, transfer of care, and discharge plans  Ordering, interpreting, and reviewing diagnostic studies/tab tests  Obtaining necessary history from family, EMS, nursing staff and/or treating physicians    My critical care time did not include time spent teaching resident physician(s) or other services of resident physicians, or performing other reported procedures.  Total Critical Care Time: 38 minutes    Ann Held, MD

## 2018-05-02 NOTE — Respiratory Therapy (Signed)
VENTILATOR - PRESSURE CONTROL CONTINUOUS Discontinue   Duration: Until Specified    Priority: Routine       Question Answer Comment   FIO2 (%) 30    Peep(cm/H2O) 10    Rate(bpm) 14    Pressure Control(cm/H2O) 18    Indications IMPROVE DISTRIBUTION OF VENTILATION            Patient on current vent settings overnight and tolerating well. No issues at this time. Will continue to monitor and wean as tolerated.

## 2018-05-02 NOTE — Respiratory Therapy (Signed)
Weaning parameters were done after a SBT   RSBI was 126

## 2018-05-02 NOTE — Progress Notes (Signed)
[facility]                                                      Trauma Progress Note                 Date of Birth:  1960-08-03  Date of Admission:  04/28/2018  Date of service: 05/02/2018    Antonio Gillespie, 58 y.o., male Post trauma day 4 status post Fall    Events over the last 24 hours have included:  Following per report    Subjective:    Patient remains intubated. Appears to open eyes to voice and attend.     Objective   24 Hour Summary:    Filed Vitals:    05/02/18 0251 05/02/18 0300 05/02/18 0400 05/02/18 0500   BP:  112/64 113/63 114/75   Pulse:  (!) 101 97 95   Resp:  Temp:   36.7 C (98.1 F)    SpO2: 100% 100% 100% 100%     Labs:  Recent Labs     04/30/18  0428  05/01/18  0044 05/01/18  0107 05/01/18  0836 05/01/18  1327 05/02/18  0033   WBC 3.6*  3.6*  --   --  4.3  --   --  3.9   BANDS 2  --   --   --   --   --   --    HGB 7.6*  7.6*  --   --  7.9*  --   --  7.1*   HCT 25.5*  25.5*  --   --  26.0*  --   --  23.6*   SODIUM 141   < > 144  --   --  140 141   POTASSIUM 2.7*   < > 3.8  --   --  3.3* 3.3*   CHLORIDE 107   < > 108  --   --  105 111   BICARBONATE  --    < > 30.9*  --  32.0* 29.4 26.7*   BUN 4*   < > 4*  --   --  5* 6*   CREATININE 0.57*   < > 0.53*  --   --  0.50* 0.52*   ANIONGAP 6   < > 7  --   --  6 4   CALCIUM 7.1*   < > 7.4*  --   --  7.4* 7.4*   MAGNESIUM 2.2   < > 2.2  --   --  2.2 2.1   PHOSPHORUS 3.1   < > 2.5  --   --  2.4 2.5    < > = values in this interval not displayed.       Intake/Output Summary (Last 24 hours) at 05/02/2018 0615  Last data filed at 05/02/2018 0600  Gross per 24 hour   Intake 1367 ml   Output 1870 ml   Net -503 ml     Nutrition Management: MNT PROTOCOL FOR DIETITIAN  ADULT TUBE FEEDING - CONTINUOUS DRIP CONTINUOUS NO MEALS, TF ONLY; OSMOLITE 1.5; OG; Initial Rate (ml/hr): 20; Increase by: 10 ml every 6 hours; Goal Rate (ml/hr): 60 Last Bowel Movement: 05/02/18  No results for input(s): ALBUMIN, PREALBUMIN in the last 72 hours.  Current  Medications:    Current Facility-Administered Medications:   .  acetaminophen (TYLENOL) 160 mg per 5 mL liquid - grape flavored, 650 mg, Gastric (NG, OG, PEG, GT), Q4H PRN, Thimm, Terra, MD, 650 mg at 04/29/18 0316  .  chlorhexidine gluconate (PERIDEX) 0.12% mouthwash, 15 mL, Swish & Spit, 2x/day, Kasandra Knudsen, Victorino Dike, MD, 15 mL at 05/01/18 2037  .  enoxaparin PF (LOVENOX) 60 mg/0.6 mL SubQ injection, 0.5 mg/kg, Subcutaneous, Q12H, Babs Sciara, MD, 50 mg at 05/01/18 2038  .  famotidine (PEPCID) tablet, 20 mg, Gastric (NG, OG, PEG, GT), 2x/day, Kasandra Knudsen, Victorino Dike, MD, 20 mg at 05/01/18 2037  .  fentaNYL (SUBLIMAZE) 50 mcg/mL injection, 100 mcg, Intravenous, Once PRN, Thimm, Terra, MD  .  gabapentin (NEURONTIN) 300mg  per 21mL oral liquid, 300 mg, Gastric (NG, OG, PEG, GT), 3x/day, Babs Sciara, MD, 300 mg at 05/01/18 2234  .  haloperidol (HALDOL) 5 mg/mL injection, 5 mg, Intravenous, Q6H PRN, Carlynn Herald, MD  .  ipratropium-albuterol 0.5 mg-3 mg(2.5 mg base)/3 mL Solution for Nebulization, 3 mL, Nebulization, Q6H, McCoy, Skyler, MD, 3 mL at 05/02/18 0250  .  lanolin-oxyquin-pet, hydrophil (BAG BALM) topical ointment, , Apply Topically, 2x/day PRN, Michaelle Birks, MD  .  LORazepam (ATIVAN) 2 mg/mL injection, 2 mg, Intravenous, Q8H, 2 mg at 05/02/18 0605 **FOLLOWED BY** LORazepam (ATIVAN) tablet, 1 mg, Gastric (NG, OG, PEG, GT), Q8H **FOLLOWED BY** [START ON 05/03/2018] LORazepam (ATIVAN) tablet, 1 mg, Gastric (NG, OG, PEG, GT), Q12H **FOLLOWED BY** [START ON 05/04/2018] LORazepam (ATIVAN) tablet, 1 mg, Gastric (NG, OG, PEG, GT), NIGHTLY, Bardes, Derelle, MD  .  midazolam (VERSED) 1 mg/mL injection, 5 mg, Intravenous, Once PRN, Thimm, Terra, MD  .  INSERT & MAINTAIN PERIPHERAL IV ACCESS, , , UNTIL DISCONTINUED **AND** DRESSING CHANGE Saline Lock; DRY STERILE, , , PRN **AND** NS flush syringe, 2 mL, Intracatheter, Q8HRS, Stopped at 05/02/18 0600 **AND** NS flush syringe, 2-6 mL, Intracatheter, Q1 MIN PRN, Dory Peru,  Hansol, MD  .  nutrition protein supplement 15 g per 30 mL packet, 2 Packet, Gastric (NG, OG, PEG, GT), Daily, Michaelle Birks, MD, 30 g at 05/01/18 1600  .  polyethylene glycol (MIRALAX) oral packet, 17 g, Gastric (NG, OG, PEG, GT), Daily, Karma Greaser, MD, 17 g at 05/01/18 9937  .  prenatal vitamin-iron-folic acid tablet, 1 Tab, Gastric (NG, OG, PEG, GT), Daily, Babs Sciara, MD, 1 Tab at 05/01/18 1610  .  senna concentrate (SENNA) 528mg  per 45mL oral liquid, 5 mL, Gastric (NG, OG, PEG, GT), 2x/day, Kasandra Knudsen, Victorino Dike, MD, 176 mg at 05/01/18 2037  .  SSIP insulin R human (HUMULIN R) 100 units/mL injection, 0-12 Units, Subcutaneous, Q4H PRN **AND** POCT WHOLE BLOOD GLUCOSE, , , Q6H, Carlynn Herald, MD  .  thiamine-vitamin B1 (BETAXIN) tablet, 100 mg, Gastric (NG, OG, PEG, GT), Daily, Babs Sciara, MD, 100 mg at 05/01/18 1116     Today's Physical Exam:  GEN:   Chronically ill appearing  HEENT:   Pupils equal but not reactive.  PULM:   Mechanically ventilated  CV:   Regular rate  ABD:   Soft, nondistended. Left inguinal hernia extending into scrotum, reducible, less prominent than previously.   NEURO:   GCS 3-4-1T, localizing with left upper extremity. Eyes opening to voice  Integumentary:  Feet and knees with scattered wounds, dry in nature, no surrounding erythema    Assessment/ Plan:   Active Hospital Problems   (*Primary Problem)    Diagnosis   . *Fall     "  found down" and taken to outside hospital     . Thrombocytopenia Unspecified   . Hypokalemia   . Alcohol abuse   . Troponin I above reference range   . Acute respiratory failure (CMS HCC)   . Nasal fracture     Acute vs chronic     . Abrasions of multiple sites   . Scalp hematoma   . Humeral fracture     nonop per ortho     . UTI (urinary tract infection)     At outside hospital. Repeat UA pending     . Left inguinal hernia     S/p reduction     . Anasarca     DVT prophylaxis:  Enoxaparin and SCDs/ Venodynes/Impulse boots  Anticoagulants (last 24  hours)     Date/Time Action Medication Dose    04/29/18 0827 Given    enoxaparin PF (LOVENOX) 40 mg/0.4 mL SubQ injection 40 mg    04/28/18 2020 Given    enoxaparin PF (LOVENOX) 40 mg/0.4 mL SubQ injection 40 mg        Nutrition: MNT PROTOCOL FOR DIETITIAN  ADULT TUBE FEEDING - CONTINUOUS DRIP CONTINUOUS NO MEALS, TF ONLY; OSMOLITE 1.5; OG; Initial Rate (ml/hr): 20; Increase by: 10 ml every 6 hours; Goal Rate (ml/hr): 60 diet, Last Bowel Movement: 05/02/18  Activity: No limitations    Pain:   Analgesics (last 24 hours)     Date/Time Action Medication Dose    04/29/18 0316 Given    acetaminophen (TYLENOL) 160 mg per 5 mL liquid - grape flavored 650 mg    04/28/18 1720 Given    fentaNYL (SUBLIMAZE) 50 mcg/mL injection 100 mcg    04/28/18 1648 Given    fentaNYL (SUBLIMAZE) 50 mcg/mL injection 50 mcg    04/28/18 1633 Given    fentaNYL (SUBLIMAZE) 50 mcg/mL injection 100 mcg        PT Recommendations:  Pending    Plan:     Poor neurological exam   - EEG - no seizures   - NCCU consult   - CTA intra/extracranial - no significant findings   - MRI with nonspecific white matter changes   - MRI C-spine pending    Left inguinal hernia   - Reducible at bedside   - Continue to monitor for overlying skin changes   - Will attempt daily reduction    Right humeral neck fracture   - Ortho consulted   - No operative intervention at this time   - Sling as needed    Nasal bone fracture   - ENT consult   - Will re-eval when more awake    Bilateral lower extremity wounds   - ABI/PVR obtained, normal   - Wound care consult    UA with moderate leuks, culture negative, nitrofurantoin given  Increase tube feeds to goal as able, currently on trickles due to refeeding concern  Continue DVT prophylaxis  Wean vent as able, possible extubation per SICU  Follow up MRI C-spine to clear collar    Will establish surrogacy with Care Management as patient may require trach/PEG    Gerhard Munch, MD  05/02/2018, 15:34    Trauma/Surgical Critical Care/Acute  Care Surgery Staff Delayed Entry     I have seen and examined the patient. I have reviewed the resident  note.  I agree with the findings and plan of care as documented on 05/02/18 including history, review of systems, examination, and findings with exceptions/additions as noted/edited.  Labs and vital signs reviewed: Yes    Medical Decision Making component commentary:   Neuro:Acute pain secondary to trauma/surgery: Continue multimodal pain therapy with limitation of narcotics as ableWean IV narcotics off  MRI Cspine to clear CCollar  Sleep hygeine and melatonin.  Alcohol history- thiamine and folate   Resp: Continue pulmonary hygiene/IS    Ac resp failure- poss sBT later today pending mental status. If no progress, will plan for Trach/PEG Thursday   CV: Hemodynamically acceptable. Cont to monitor  Ensure hold parameters on antihypertensives to prevent hypotension  GI: Diet: NPO  Tube feeds   GI Prophylaxis: Pepcid   Inguinal hernia- eduction at bedside with trauma  Bowel Reg-continue  Last Bowel Movement: 05/02/18  Endo: ISS for treatment of hyperglycemia.   Renal: Monitor Uo, Lytes-  Hypokalemia, replace K to prevent potential neuromuscular or  cardiac events, ileus, renal abnormalities or  glucose intolerance  Hypophosphatemia, replace Phos to prevent potential neuologic dysfunction, delayed healing, cardiac events, hematologic dysfunction or mineral metabolism abnormalities.  Foley-/ciritical I*O  Heme:  Ac Blood Loss Anemia-asx On supplemental vitamins  ID: Afebrile  Skin/Musculoskeletal: continue local wound care  DVT Prophylaxis: SCDs, Lovenox/Hep given increased risk of DVT formation secondary to trauma/decreased mobility/surgery  Therapies: PT/OT  Activity NWB RUEXT  Disposition: cont ICU     Electronically Signed by:    Warren Lacyonnie M DeLa'O MD, MS  Assistant Professor of Surgery  Trauma, Surgical Critical Care, and Acute Care Surgery  [facility] [place]  05/03/2018 01:32

## 2018-05-02 NOTE — Care Plan (Signed)
Patient not following commands at this time but in no apparent respiratory distress.  His current vent settings are PSV -  8/+5 and 30%.  Will continue to follow and monitor patient's respiratory status weaning as tolerated.

## 2018-05-02 NOTE — Nurses Notes (Signed)
Restraint Continuation      Patient continues to have the following condition AMS r/t ICU delirum and alcohol withdrawal..       Least restrictive alternatives attempted : Increase patient observation, concealed tubes/lines, decreased/removed stimulus, frequent orientation, reoriented to person, place, time, and treatment, encouraged relaxation techniques and redirected behavior    Patient continues to exhibit the following behaviors: confused, agitated, restless, reaching for ETT    The restraint continued to facilitate medical/surgical treatment to ensure safety.    The patient will continue to be evaluated and assessments documented on the flowsheet to ensure that the patient is released from the restraint at the earliest possible time

## 2018-05-02 NOTE — Care Plan (Signed)
2-3/6/1, FC with LUE and wiggles toes. Withdraws RUE. Pupils irregular but sluggishly reactive on pupilometer. BUE continued. Intubation continued, unable to follow complex commands with SBT this afternoon. Low ST today, BP stable. PIV for access, no continuous drips. TF at goal through OGT. Foley in place for accurate I/O.  Plan to extubate when able.   Problem: Adult Inpatient Plan of Care  Goal: Patient-Specific Goal (Individualized)  Outcome: Ongoing (see interventions/notes)  Flowsheets (Taken 05/02/2018 1842)  Individualized Care Needs: Turns q2hrs, wean ventilator, oral care  Anxieties, Fears or Concerns: reaches for ETT  Patient-Specific Goals (Include Timeframe): Extubate when able

## 2018-05-03 ENCOUNTER — Inpatient Hospital Stay (HOSPITAL_COMMUNITY): Payer: Medicaid Other | Admitting: Certified Registered"

## 2018-05-03 ENCOUNTER — Inpatient Hospital Stay (HOSPITAL_COMMUNITY): Payer: Medicaid Other

## 2018-05-03 ENCOUNTER — Encounter (HOSPITAL_COMMUNITY)
Admission: EM | Disposition: A | Discharge status: Hospice | Payer: Self-pay | Source: Other Acute Inpatient Hospital | Attending: Surgery

## 2018-05-03 DIAGNOSIS — R918 Other nonspecific abnormal finding of lung field: Secondary | ICD-10-CM

## 2018-05-03 DIAGNOSIS — E46 Unspecified protein-calorie malnutrition: Secondary | ICD-10-CM

## 2018-05-03 DIAGNOSIS — R9431 Abnormal electrocardiogram [ECG] [EKG]: Secondary | ICD-10-CM

## 2018-05-03 DIAGNOSIS — Q2547 Right aortic arch: Secondary | ICD-10-CM

## 2018-05-03 DIAGNOSIS — I491 Atrial premature depolarization: Secondary | ICD-10-CM

## 2018-05-03 DIAGNOSIS — J90 Pleural effusion, not elsewhere classified: Secondary | ICD-10-CM

## 2018-05-03 DIAGNOSIS — J449 Chronic obstructive pulmonary disease, unspecified: Secondary | ICD-10-CM | POA: Diagnosis present

## 2018-05-03 DIAGNOSIS — L89819 Pressure ulcer of head, unspecified stage: Secondary | ICD-10-CM

## 2018-05-03 DIAGNOSIS — I96 Gangrene, not elsewhere classified: Secondary | ICD-10-CM

## 2018-05-03 DIAGNOSIS — J9690 Respiratory failure, unspecified, unspecified whether with hypoxia or hypercapnia: Secondary | ICD-10-CM

## 2018-05-03 DIAGNOSIS — R000 Tachycardia, unspecified: Secondary | ICD-10-CM

## 2018-05-03 DIAGNOSIS — Z930 Tracheostomy status: Secondary | ICD-10-CM

## 2018-05-03 DIAGNOSIS — M4186 Other forms of scoliosis, lumbar region: Secondary | ICD-10-CM

## 2018-05-03 LAB — BASIC METABOLIC PANEL
ANION GAP: 5 mmol/L (ref 4–13)
ANION GAP: 4 mmol/L (ref 4–13)
BUN/CREA RATIO: 17 (ref 6–22)
BUN/CREA RATIO: 17 (ref 6–22)
BUN: 9 mg/dL (ref 8–25)
BUN: 9 mg/dL (ref 8–25)
CALCIUM: 7.3 mg/dL — ABNORMAL LOW (ref 8.5–10.2)
CALCIUM: 7.3 mg/dL — ABNORMAL LOW (ref 8.5–10.2)
CHLORIDE: 111 mmol/L (ref 96–111)
CHLORIDE: 109 mmol/L (ref 96–111)
CO2 TOTAL: 24 mmol/L (ref 22–32)
CO2 TOTAL: 24 mmol/L (ref 22–32)
CREATININE: 0.53 mg/dL — ABNORMAL LOW (ref 0.62–1.27)
CREATININE: 0.54 mg/dL — ABNORMAL LOW (ref 0.62–1.27)
ESTIMATED GFR: >60 mL/min/1.73mˆ2 (ref >60)
ESTIMATED GFR: >60 mL/min/1.73mˆ2 (ref >60)
GLUCOSE: 128 mg/dL (ref 65–139)
GLUCOSE: 136 mg/dL (ref 65–139)
POTASSIUM: 4.3 mmol/L (ref 3.5–5.1)
POTASSIUM: 3.8 mmol/L (ref 3.5–5.1)
SODIUM: 140 mmol/L (ref 136–145)
SODIUM: 137 mmol/L (ref 136–145)

## 2018-05-03 LAB — ARTERIAL BLOOD GAS/LACTATE
%FIO2 (ARTERIAL): 30 %
%FIO2 (ARTERIAL): 30 %
BASE DEFICIT: 0.2 mmol/L (ref 0.0–3.0)
BASE DEFICIT: 2 mmol/L (ref 0.0–3.0)
BICARBONATE (ARTERIAL): 24.8 mmol/L (ref 18.0–26.0)
BICARBONATE (ARTERIAL): 23.4 mmol/L (ref 18.0–26.0)
LACTATE: 1.1 mmol/L (ref 0.0–1.3)
LACTATE: 0.8 mmol/L (ref 0.0–1.3)
PAO2/FIO2 RATIO: 360 (ref <=200)
PAO2/FIO2 RATIO: 360 (ref <=200)
PAO2/FIO2 RATIO: 350 (ref <=200)
PCO2 (ARTERIAL): 37 mmHg (ref 35.0–45.0)
PCO2 (ARTERIAL): 43 mmHg (ref 35.0–45.0)
PH (ARTERIAL): 7.42 (ref 7.35–7.45)
PH (ARTERIAL): 7.35 (ref 7.35–7.45)
PO2 (ARTERIAL): 108 mmHg — ABNORMAL HIGH (ref 72.0–100.0)
PO2 (ARTERIAL): 105 mmHg — ABNORMAL HIGH (ref 72.0–100.0)

## 2018-05-03 LAB — CBC
HCT: 24.9 % — ABNORMAL LOW (ref 38.9–52.0)
HCT: 26.9 % — ABNORMAL LOW (ref 38.9–52.0)
HGB: 7.6 g/dL — ABNORMAL LOW (ref 13.4–17.5)
HGB: 8 g/dL — ABNORMAL LOW (ref 13.4–17.5)
MCH: 26.6 pg (ref 26.0–32.0)
MCH: 26.2 pg (ref 26.0–32.0)
MCHC: 30.5 g/dL — ABNORMAL LOW (ref 31.0–35.5)
MCHC: 29.7 g/dL — ABNORMAL LOW (ref 31.0–35.5)
MCV: 87.1 fL (ref 78.0–100.0)
MCV: 88.2 fL (ref 78.0–100.0)
MPV: 11.8 fL (ref 8.7–12.5)
MPV: 11.4 fL (ref 8.7–12.5)
PLATELETS: 128 x10ˆ3/uL — ABNORMAL LOW (ref 150–400)
PLATELETS: 150 x10ˆ3/uL (ref 150–400)
RBC: 2.86 x10ˆ6/uL — ABNORMAL LOW (ref 4.50–6.10)
RBC: 3.05 x10ˆ6/uL — ABNORMAL LOW (ref 4.50–6.10)
RDW-CV: 23.3 % — ABNORMAL HIGH (ref 11.5–15.5)
RDW-CV: 22.7 % — ABNORMAL HIGH (ref 11.5–15.5)
WBC: 4.5 x10ˆ3/uL (ref 3.7–11.0)
WBC: 6.1 x10ˆ3/uL (ref 3.7–11.0)

## 2018-05-03 LAB — MAGNESIUM
MAGNESIUM: 2 mg/dL (ref 1.6–2.6)
MAGNESIUM: 2.1 mg/dL (ref 1.6–2.6)

## 2018-05-03 LAB — PT/INR
INR: 1.25 — ABNORMAL HIGH (ref 0.80–1.20)
PROTHROMBIN TIME: 14.5 s — ABNORMAL HIGH (ref 9.1–13.9)

## 2018-05-03 LAB — TYPE AND CROSS RED CELLS - UNITS
ABO/RH(D): O NEG
ANTIBODY SCREEN: NEGATIVE
UNITS ORDERED: 2

## 2018-05-03 LAB — POC BLOOD GLUCOSE (RESULTS)
GLUCOSE, POC: 108 mg/dL — ABNORMAL HIGH (ref 70–105)
GLUCOSE, POC: 165 mg/dL — ABNORMAL HIGH (ref 70–105)
GLUCOSE, POC: 136 mg/dL — ABNORMAL HIGH (ref 70–105)

## 2018-05-03 LAB — PHOSPHORUS
PHOSPHORUS: 2.9 mg/dL (ref 2.4–4.7)
PHOSPHORUS: 3.3 mg/dL (ref 2.4–4.7)

## 2018-05-03 LAB — PTT (PARTIAL THROMBOPLASTIN TIME): APTT: 32.2 s (ref 24.2–37.5)

## 2018-05-03 LAB — ADULT ROUTINE BLOOD CULTURE, SET OF 2 BOTTLES (BACTERIA AND YEAST)
BLOOD CULTURE, ROUTINE: NO GROWTH
BLOOD CULTURE, ROUTINE: NO GROWTH

## 2018-05-03 SURGERY — TRACHEOSTOMY
Anesthesia: General | Site: Abdomen | Wound class: Clean Contaminated Wounds-The respiratory, GI, Genital, or urinary

## 2018-05-03 MED ORDER — LIDOCAINE 1 %-EPINEPHRINE 1:100,000 INJECTION SOLUTION
INTRAMUSCULAR | Status: AC
Start: 2018-05-03 — End: 2018-05-03
  Filled 2018-05-03: qty 30

## 2018-05-03 MED ORDER — ROCURONIUM 10 MG/ML INTRAVENOUS SOLUTION
Freq: Once | INTRAVENOUS | Status: DC | PRN
Start: 2018-05-03 — End: 2018-05-03
  Administered 2018-05-03: 20 mg via INTRAVENOUS
  Administered 2018-05-03: 50 mg via INTRAVENOUS

## 2018-05-03 MED ORDER — CALCIUM GLUCONATE 100 MG/ML (10 %) INTRAVENOUS SOLUTION
1000.0000 mg | Freq: Once | INTRAVENOUS | Status: AC
Start: 2018-05-03 — End: 2018-05-03
  Administered 2018-05-03: 1000 mg via INTRAVENOUS
  Filled 2018-05-03: qty 10

## 2018-05-03 MED ORDER — SODIUM CHLORIDE 0.9 % IRRIGATION SOLUTION
1000.0000 mL | Status: DC | PRN
Start: 2018-05-03 — End: 2018-05-03
  Administered 2018-05-03: 1000 mL

## 2018-05-03 MED ORDER — LIDOCAINE (PF) 10 MG/ML (1 %) INJECTION SOLUTION
30.0000 mL | Freq: Once | INTRAMUSCULAR | Status: DC | PRN
Start: 2018-05-03 — End: 2018-05-03

## 2018-05-03 MED ORDER — LIDOCAINE 1 %-EPINEPHRINE 1:100,000 INJECTION SOLUTION
15.0000 mL | Freq: Once | INTRAMUSCULAR | Status: DC | PRN
Start: 2018-05-03 — End: 2018-05-03

## 2018-05-03 MED ORDER — HYDROMORPHONE 2 MG/ML INJECTION SYRINGE
0.2000 mg | INJECTION | INTRAMUSCULAR | Status: DC | PRN
Start: 2018-05-03 — End: 2018-05-05

## 2018-05-03 MED ORDER — ELECTROLYTE-A INTRAVENOUS SOLUTION
INTRAVENOUS | Status: DC | PRN
Start: 2018-05-03 — End: 2018-05-03

## 2018-05-03 MED ORDER — CEFAZOLIN 2 GRAM SOLUTION FOR INJECTION - IV PUSH KIT
2.0000 g | Freq: Once | INTRAMUSCULAR | Status: AC
Start: 2018-05-03 — End: 2018-05-03
  Administered 2018-05-03: 2 g via INTRAVENOUS
  Filled 2018-05-03: qty 20

## 2018-05-03 MED ORDER — PHENYLEPHRINE 0.5 MG/5 ML (100 MCG/ML)IN 0.9 % SOD.CHLORIDE IV SYRINGE
INJECTION | Freq: Once | INTRAVENOUS | Status: DC | PRN
Start: 2018-05-03 — End: 2018-05-03
  Administered 2018-05-03: 100 ug via INTRAVENOUS
  Administered 2018-05-03 (×2): 200 ug via INTRAVENOUS
  Administered 2018-05-03: 100 ug via INTRAVENOUS
  Administered 2018-05-03: 200 ug via INTRAVENOUS
  Administered 2018-05-03: 100 ug via INTRAVENOUS
  Administered 2018-05-03: 200 ug via INTRAVENOUS

## 2018-05-03 MED ORDER — MIDAZOLAM 1 MG/ML INJECTION SOLUTION
Freq: Once | INTRAMUSCULAR | Status: DC | PRN
Start: 2018-05-03 — End: 2018-05-03
  Administered 2018-05-03: 2 mg via INTRAVENOUS

## 2018-05-03 MED ORDER — PROPOFOL 10 MG/ML IV BOLUS
INJECTION | Freq: Once | INTRAVENOUS | Status: DC | PRN
Start: 2018-05-03 — End: 2018-05-03
  Administered 2018-05-03: 30 mg via INTRAVENOUS

## 2018-05-03 MED ORDER — ELECTROLYTE-A INTRAVENOUS SOLUTION
INTRAVENOUS | Status: DC
Start: 2018-05-03 — End: 2018-05-04

## 2018-05-03 MED ORDER — HYDROMORPHONE 2 MG/ML INJECTION SYRINGE
0.4000 mg | INJECTION | INTRAMUSCULAR | Status: DC | PRN
Start: 2018-05-03 — End: 2018-05-05
  Administered 2018-05-03: 0.4 mg via INTRAVENOUS
  Filled 2018-05-03: qty 1

## 2018-05-03 MED ADMIN — calcium gluconate 100 mg/mL (10 %) intravenous solution: INTRAVENOUS | @ 19:00:00

## 2018-05-03 MED ADMIN — sodium chloride 0.9 % (flush) injection syringe: @ 16:00:00

## 2018-05-03 MED ADMIN — insulin regular human 100 unit/mL injection solution: @ 16:00:00

## 2018-05-03 MED ADMIN — gentamicin 0.3 % eye drops: @ 15:00:00

## 2018-05-03 MED ADMIN — famotidine 20 mg tablet: GASTROSTOMY | @ 09:00:00

## 2018-05-03 MED ADMIN — gabapentin 250 mg/5 mL oral solution: @ 15:00:00

## 2018-05-03 MED ADMIN — dexMEDEtomidine 400 mcg/100 mL (4 mcg/mL) in 0.9 % sodium chloride IV: INTRAVENOUS | @ 11:00:00

## 2018-05-03 MED ADMIN — heparin (porcine) 5,000 unit/mL injection solution: @ 15:00:00

## 2018-05-03 MED ADMIN — lanolin-oxyquin-pet, hydrophil topical ointment: @ 16:00:00

## 2018-05-03 MED ADMIN — amino ac-protein hydro-whey protein 10 gram-100 kcal/30 mL oral liquid: @ 15:00:00

## 2018-05-03 MED ADMIN — dexMEDEtomidine 400 mcg/100 mL (4 mcg/mL) in 0.9 % sodium chloride IV: INTRAVENOUS | @ 18:00:00

## 2018-05-03 MED ADMIN — electrolyte-A intravenous solution: INTRAVENOUS | @ 17:00:00

## 2018-05-03 MED ADMIN — LORazepam 1 mg tablet: @ 15:00:00

## 2018-05-03 MED ADMIN — rocuronium 10 mg/mL intravenous solution: INTRAVENOUS | @ 15:00:00

## 2018-05-03 MED ADMIN — HYDROmorphone 2 mg/mL injection syringe: @ 16:00:00

## 2018-05-03 MED ADMIN — enoxaparin 60 mg/0.6 mL subcutaneous syringe: SUBCUTANEOUS | @ 11:00:00

## 2018-05-03 MED ADMIN — enoxaparin 60 mg/0.6 mL subcutaneous syringe: @ 15:00:00

## 2018-05-03 MED ADMIN — phenylephrine 0.5 mg/5 mL (100 mcg/mL)in 0.9 % sod.chloride IV syringe: INTRAVENOUS | @ 15:00:00

## 2018-05-03 MED ADMIN — electrolyte-A intravenous solution: INTRAVENOUS | @ 16:00:00

## 2018-05-03 MED ADMIN — aspirin 81 mg chewable tablet: GASTROSTOMY | @ 09:00:00

## 2018-05-03 MED ADMIN — electrolyte-A intravenous solution: INTRAVENOUS | @ 22:00:00

## 2018-05-03 MED ADMIN — propofol 10 mg/mL intravenous emulsion: INTRAVENOUS | @ 15:00:00

## 2018-05-03 MED ADMIN — chlorhexidine gluconate 0.12 % mouthwash: @ 15:00:00

## 2018-05-03 MED ADMIN — dextrose 5 % and lactated ringers intravenous solution: INTRAVENOUS | @ 12:00:00

## 2018-05-03 MED ADMIN — midazolam 2 mg/mL oral syrup: @ 07:00:00

## 2018-05-03 MED ADMIN — oxyCODONE 5 mg tablet: GASTROSTOMY | @ 14:00:00

## 2018-05-03 MED ADMIN — oxyCODONE 5 mg tablet: @ 16:00:00

## 2018-05-03 MED ADMIN — HYDROcodone 7.5 mg-acetaminophen 325 mg/15 mL oral solution: INTRAVENOUS | @ 15:00:00

## 2018-05-03 MED ADMIN — hydroCHLOROthiazide 12.5 mg capsule: @ 16:00:00

## 2018-05-03 MED ADMIN — EPINEPHRINE PEDS INFUSION - HIGHER CONCENTRATION OPTIONS: INTRAVENOUS | @ 16:00:00

## 2018-05-03 MED ADMIN — sodium chloride 0.9 % intravenous solution: @ 15:00:00

## 2018-05-03 MED ADMIN — lactated Ringers intravenous solution: INTRAVENOUS | @ 15:00:00

## 2018-05-03 MED ADMIN — bacitracin 500 unit/gram topical ointment: GASTROSTOMY | @ 22:00:00 | NDC 63868091628

## 2018-05-03 MED ADMIN — scopolamine 1 mg over 3 days transdermal patch: INTRAVENOUS | @ 01:00:00

## 2018-05-03 MED ADMIN — insulin lispro 100 unit/mL subcutaneous solution: GASTROSTOMY | @ 09:00:00

## 2018-05-03 MED ADMIN — DEXTROSE VARIABLE W/ ELECTROLYTE-R W/ WO ADDITIVES: @ 16:00:00

## 2018-05-03 MED ADMIN — EPINEPHRINE 15MG IN D5W OR NS 250ML INFUSION: @ 16:00:00

## 2018-05-03 MED ADMIN — sodium chloride 0.9 % intravenous solution: INTRAVENOUS | @ 15:00:00

## 2018-05-03 MED ADMIN — sodium chloride 0.9 % (flush) injection syringe: ORAL | @ 22:00:00

## 2018-05-03 MED ADMIN — lactated Ringers intravenous solution: INTRAVENOUS | @ 19:00:00

## 2018-05-03 MED ADMIN — sodium chloride 0.9 % (flush) injection syringe: SUBCUTANEOUS | @ 22:00:00

## 2018-05-03 MED ADMIN — sodium chloride 0.9 % (flush) injection syringe: ORAL | @ 09:00:00

## 2018-05-03 MED ADMIN — insulin glargine (U-100) 100 unit/mL (3 mL) subcutaneous pen: INTRAVENOUS | @ 15:00:00

## 2018-05-03 MED ADMIN — aspirin 81 mg chewable tablet: INTRAVENOUS | @ 15:00:00

## 2018-05-03 MED ADMIN — lidocaine 1 %-epinephrine 1:100,000 injection solution: INTRAVENOUS | @ 15:00:00

## 2018-05-03 MED ADMIN — nitroglycerin 0.4 mg sublingual tablet: @ 12:00:00

## 2018-05-03 SURGICAL SUPPLY — 79 items
APPL 70% ISPRP 2% CHG 26ML 13._2X13.2IN CHLRPRP PREP DEHP-FR (WOUND CARE/ENTEROSTOMAL SUPPLY) ×1
APPL 70% ISPRP 2% CHG 26ML CHLRPRP HI-LT ORNG PREP STRL LF  DISP CLR (WOUND CARE SUPPLY) ×2 IMPLANT
BAG DRAIN UROLOGY 2000ML LF_154003 20EA/CS (UROLOGICAL SUPPLIES) ×1
BINDER ABDOMINAL 10IN SLD PNL HKLP CLSR 27-48IN UNIV NONST LF (ORTHOPEDICS (NOT IMPLANTS)) IMPLANT
BINDER ABDOMINAL 82-94X9IN HKL_P CLSR 3 PNL ELAS 2XL BARI (ORTHOPEDICS (NOT IMPLANTS))
BINDER ABDOMINAL 9IN HKLP CLSR 3 PNL ELAS 75-84IN XL NONST LF (ORTHOPEDICS (NOT IMPLANTS)) IMPLANT
BINDER ABDOMINAL 9IN MULTIPANEL HKLP CLSR 85-94IN 2XL BARI NONST LF (ORTHOPEDICS (NOT IMPLANTS)) IMPLANT
BITE BLOCK ADULT LATEX FREE_000429 CS/50 (AIR) ×1
BLADE 11 BD RB-BCK CBNSTL SURG STRL LF (CUTTING ELEMENTS) ×1
BLADE 11 BD RB-BCK CBNSTL SURG TISS STRL LF  DISP (SURGICAL CUTTING SUPPLIES) ×2 IMPLANT
BLANKET MISTRAL-AIR ADULT LWR BODY 55.9X40.2IN FRC AIR HI VOL BLWR INTUITIVE CONTROL PNL LRG LED (MISCELLANEOUS PT CARE ITEMS) ×2 IMPLANT
BLOCK BITE 20MM PE ADULT MOUTHPC STRAP RETENTION RIM LUM SCPSVR LF  LRG 27MM GRN NONST DISP (AIR) ×2 IMPLANT
CATH SUCT ARLF TRIFLO 10FR CONTROL PORT STRL LF (Suction) IMPLANT
CATH SUCT ARLF TRIFLO 14FR STR 2 3ANG EYE CONTROL PORT BVL TIP STRL LF  DISP (Suction) IMPLANT
CATH SUCT ARLF TRIFLO 14FR STR PK CNTRL STRL LF (Suction)
CATH SUCT ARLF TRIFLO 18FR 2 3ANG EYE BVL TIP CONN CONTROL PORT STRL LF  DISP CLR (Suction) IMPLANT
CONV USE 23866 - NEEDLE HYPO 27GA 1.5IN STD MONOJECT SS POLYPROP REG BVL LL HUB UL SHRP ANTICORE YW STRL LF  DISP (NEEDLES & SYRINGE SUPPLIES) ×2 IMPLANT
CONV USE 320027 - CHILDRENS USE 320025 - KIT RM TURNOVER CSTM NONST LF (KITS & TRAYS (DISPOSABLE)) ×2
CONV USE 320027 - CHILDRENS USE 320025 - KIT RM TURNOVER CUSTOM NONST LF (KITS & TRAYS (DISPOSABLE)) ×2 IMPLANT
CONV USE 404719 - PACK SURG SIRUS MAYO BASIC V TBL STAND CVR 90X50IN 53X24IN LF (CUSTOM TRAYS & PACK) ×2
CONV USE ITEM 309979 - DRAPE DIAMOND FENESTRATE HKLP CLSR 121X102X77IN THR T PRXM LF  STRL DISP SURG SMS (PROTECTIVE PRODUCTS/GARMENTS) ×2
CONV USE ITEM 337687 - DRAPE REINF FNFLD 90X44IN LF  STRL DISP SURG (EQUIPMENT MINOR)
CONV USE ITEM 337890 - PACK SURG BSIN 2 STRL LF  DISP (CUSTOM TRAYS & PACK) ×2 IMPLANT
CONV USE ITEM 337905 - KIT SURG MIN STUP STRL DISP LF (KITS & TRAYS (DISPOSABLE)) ×2 IMPLANT
CONV USE ITEM 343591 - SOLIDIFY FLUID 1500ML DSPNSR L_Q TX SOLIDIFY SFTP LTS+ DISP (STER) ×2 IMPLANT
COVER WAND RFD STRL 50EA/CS 01-0020 (EQUIPMENT MINOR) ×1
COVER WND RF DETECT STRL CLR EQP (EQUIPMENT MINOR) ×2 IMPLANT
DISC USE 162466 - BAG DRAIN UROLOGY 2000ML LF_154003 20EA/CS (UROLOGICAL SUPPLIES) ×2 IMPLANT
DONUT POSITION 9IN HEAD FM (SUPP) ×1
DRAPE 2 LYR ABS 70X40IN MED UN_IV LF DISP SURG BILAMINATE (PROTECTIVE PRODUCTS/GARMENTS) ×1
DRAPE DIAMOND FENESTRATE HKLP CLSR 121X102X77IN THR T PRXM (PROTECTIVE PRODUCTS/GARMENTS) ×1
DRAPE DIAMOND FENESTRATE HKLP CLSR 121X102X77IN THR T PRXM LF  STRL DISP SURG SMS (PROTECTIVE PRODUCTS/GARMENTS) ×2 IMPLANT
DRAPE FNFLD SHEET 70X40IN MED PRXM LF  STRL DISP SURG SMS (PROTECTIVE PRODUCTS/GARMENTS) ×2 IMPLANT
DRAPE REINF FNFLD 90X44IN LF  STRL DISP SURG (EQUIPMENT MINOR) IMPLANT
DUPE USE ITEM 319443 - SUTURE SILK 3-0 PERMAHAND 24IN_BLK BRD TIE NONAB (SUTURE/WOUND CLOSURE) ×2 IMPLANT
DUPE USE ITEM 319444 - SUTURE 0 V-7 PROLENE 36IN BLU_2 ARM MONOF NONAB (SUTURE/WOUND CLOSURE) ×2 IMPLANT
ELECTRODE ESURG BLADE PNCL 10FT VLAB STRL SS DISP BUTTON SWH HEX LOCK CORD HLSTR LF  ACPT 3/32IN STD (CAUTERY SUPPLIES) ×2 IMPLANT
ELECTRODE ESURG NEEDLE 2.8IN 3/32IN VLAB STRL SS 1.1IN DISP STD SHAFT LF (CAUTERY SUPPLIES) IMPLANT
ELECTRODE PATIENT RTN 9FT VLAB C30- LB RM PHSV ACRL FOAM CORD NONIRRITATE NONSENSITIZE ADH STRP (CAUTERY SUPPLIES) ×2 IMPLANT
EXCHNG HEATMOIST TAPER END FILTER GAS SP FLXB TUBE LF  PORTEX 22MM 15MM DISP NONST 27GM HMDFCN SYS (SURGICAL INSTRUMENTS) ×2 IMPLANT
GAUZE SURG 4X4IN STD RFDETECT COTTON 16 PLY XRY ABS LF  STRL DISP (WOUND CARE SUPPLY) IMPLANT
GOWN SURG XL AAMI L3 NONREINFO_RCE HKLP CLSR STRL LTX PNK SMS (DGOW)
GOWN SURG XL L3 NONREINFORCE HKLP CLSR STRL LTX PNK SMS 47IN (DGOW) IMPLANT
HANDLE RIGID PLASTIC STRL LF  DISP DVN EZ HNDL SURG LIGHT (INSTRUMENTS) ×2 IMPLANT
HANDPC SUCT MEDIVAC YANKAUER BLBS TIP CLR STRL LF  DISP (Suction) ×2 IMPLANT
HANDPC SUCT MEDIVAC YANKAUER B_LBS TIP CLR STRL LF DISP (Suction) ×2
HOLDER TUBE MED CUFFLATOR PSY FOAM TRACH TIE NK COL PAD HKLP CLSR 9-17IN 8.5X1IN TRACH LF (AIR) ×2 IMPLANT
JELLY LUB PDI BCTRST H2O SOL N ONSTAIN NGRS STRL GLYC MTHY (SURGICAL INSTRUMENTS) ×4 IMPLANT
KIT ENDOS ENDOZIME BDSD SLR SPONGE ENZM DETERGENT 500ML (DIS) ×2 IMPLANT
KIT ENDOS ENZM BDSD SLR SPONGE_ENZM DTRG 500ML (DIS) ×1
KIT MINOR SURG SET UP CS/28 (KITS & TRAYS (DISPOSABLE)) ×1
KIT PEG 20FR SIL PUL RADOPQ ME_D PORT MIC SECURLOK 12ML STRL ×2 IMPLANT
KIT SURG MIN STUP STRL DISP LF (KITS & TRAYS (DISPOSABLE)) ×2
LABEL E-Z STICK_STLEZP1 100EA/CS (LABELS/CHART SUPPLIES) ×1
LABEL MED EZ PEEL MRKR LF (LABELS/CHART SUPPLIES) ×2 IMPLANT
MBO USE ITEM 317672 - HANDLE RIGID PLASTIC STRL LF  DISP DVN EZ HNDL SURG LIGHT (SURGICAL INSTRUMENTS) ×2 IMPLANT
PACK SURG SIRUS MAYO BASIC V TBL STAND CVR 90X50IN 53X24IN LF (CUSTOM TRAYS & PACK) ×2 IMPLANT
PAD ARMBOARD FOAM BLU FP-ECARM (POSITIONING PRODUCTS) ×2
PAD ARMBRD BLU (POSITIONING PRODUCTS) ×4 IMPLANT
POSITION POSITION 9IN DONUT HEAD FOAM (SUPP) ×2 IMPLANT
SOLIDIFY FLUID 1500ML DSPNSR L_Q TX SOLIDIFY SFTP LTS+ DISP (STER) ×3 IMPLANT
SPONGE DISSECTOR KITTNER STRL_MDS73512 1/4X9/16IN 100PK5/CS (WOUND CARE/ENTEROSTOMAL SUPPLY) ×1
SPONGE GAUZE 4X4IN EXCLN AMD PHMB 6 PLY ANTIMIC NWVN HYPOALL LF  STRL DISP (WOUND CARE SUPPLY) ×2 IMPLANT
SPONGE LAP 9/16X.25IN DSCT RADOPQ FBR STRL (WOUND CARE SUPPLY) ×2 IMPLANT
STRAP POSITION KNEE FOAM SFT A DJ CNTCT CLSR LF (POSITIONING PRODUCTS) ×1
STRAP POSITION KNEE FOAM SFT ADJ CNTCT CLSR LF (POSITIONING PRODUCTS) ×2 IMPLANT
SUTURE 3-0 PS-1 ETHILON 18.0I_N BLK NYLON MONOF NYL N/ABSB (SUTURE/WOUND CLOSURE) ×3 IMPLANT
SUTURE SILK 2-0 KS PERMAHAND 30IN BLK BRD NONAB (SUTURE/WOUND CLOSURE) IMPLANT
SUTURE SILK 2-0 SH PERMAHAND 30IN BLK BRD NONAB (SUTURE/WOUND CLOSURE) ×2 IMPLANT
SYRINGE BD LL 10ML LF STRL CO NTROL CONCEN TIP PRGN FREE (NEEDLES & SYRINGE SUPPLIES) ×1
SYRINGE LL 10ML LF  STRL CONTROL CONCEN TIP PRGN FREE DEHP-FR MED DISP (NEEDLES & SYRINGE SUPPLIES) ×2 IMPLANT
SYRINGE LL 30ML LF  STRL CONCEN TIP GRAD N-PYRG DEHP-FR MED DISP (NEEDLES & SYRINGE SUPPLIES) ×2 IMPLANT
SYRINGE LL 30ML LF STRL CONCE_N TIP GRAD N-PYRG DEHP-FR MED (NEEDLES & SYRINGE SUPPLIES) ×1
TIP SUCT ARGYLE CURITY FRZR 10FR BRSS PLASTIC NI REM OBDURATOR NCDTV HNDL STRL LF  DISP (SURGICAL INSTRUMENTS) ×2 IMPLANT
TUBE TRACH SHLY 12.2MM 7.6MM 7 9MM 8 CUF DISP INR CANN STRL (AIR) ×3 IMPLANT
TUBING SUCT CLR 20FT 9/32IN MEDIVAC NCDTV M/M CONN STRL LF (Suction) ×2 IMPLANT
TUBING SUCT CLR 6FT 3/16IN MEDIVAC MXGR MALE TO MALE CONN NCDTV STRL LF (Suction) ×2 IMPLANT
TUBING SUCT CONN 20FT LONG_STRL N720A (Suction) ×2
TUBING SUCT CONN 3/16X72IN_STRL N56A (Suction) ×1

## 2018-05-03 NOTE — Nurses Notes (Signed)
05/03/18 0800   Restraint Order (Document with every START action and daily when new order obtained)   Order Obtained Yes   Length of Order 24   Restraint Type(Q2 Hours)   Restraint Used M   Soft Restraint Right Wrist CONTINUED   Soft Restraint Left Wrist CONTINUED   Pt Assessment (Q2 Hours)   Reason For Restraint Limit ability/pt attempting to remove artificial airway;Limit ability/pt attempting to pull IV lines;Limit ability/pt attempting to pull tubes;Intermittent break through RASS>-2   Pt's current behavior/status Restless;Agitated   Emotional Support Provided   Restraint Monitoring (Q2 Hours)   Assessed For Continued Need Yes   Visual Check CF;AG;DL   Circulation   (blisters noted to left wrist - mepilex dressing applied)   Restraint Removed for Assessment Yes   Range of Motion P   Fluids N   Food/Meal/Bottle N   Elimination UC   Education Group (Q2 Hours)   Discontinuation Criteria Able to follow directions;Resolution of contributing medical condition;Discontinuation of medical treatment;Decreased agitation;Oriented to self and environment   Criteria Explained YES   Patient/Co-Learner's Response NL   Restraint Plan of Care   Restraint Plan of Care Updated   .Marland KitchenRestraint Continuation      Patient continues to have an altered mental status with intermittent restlessness/agitation and attempts to pull ett/lines.       Least restrictive alternatives attempted : Increase patient observation, call light accessible, concealed tubes/lines, decreased/removed stimulus, frequent orientation, involved patient in conversation, reoriented to person, place, time, and treatment and assessed meds/medical problems    Patient continues to exhibit the following behaviors: agitated    The restraint continued to facilitate medical/surgical treatment to ensure safety.    The patient will continue to be evaluated and assessments documented on the flowsheet to ensure that the patient is released from the restraint at the earliest  possible time

## 2018-05-03 NOTE — Nurses Notes (Signed)
Patient has been increasingly tachycardic into the 130's. SICU MD rounding at bedside. Awaiting precedex gtt from pharamacy at this time with restlessness. No further orders. Will continue to closely monitor.

## 2018-05-03 NOTE — Care Plan (Signed)
Patient not following commands at this time but in no apparent respiratory distress.His current vent settings are PSV - 10/+5 and 30%. Will continue to follow and monitor patient's respiratory status weaning as tolerated.

## 2018-05-03 NOTE — Care Plan (Signed)
Patient restless on and off throughout shift. Precedex gtt. PRN dilaudid for pain. GCS 3,5,1. Q2 hr turn with preventative mepilexes in accordance with PUP bundle. Wound care recs for wound followed. Q4 oral care and PRN. ENT debrided lip wound today. VTE prophylaxis. Trached and PEGed today per trauma surgery. G tube to gravity. Meds okay tonight. No tube feeding until tomorrow. MIVF started for tonight. Family updated over the phone per services and nursing staff. Restraints continued to facilitate patient safety. Pulmonary toileting. Plan to wean to ATC. Patient has been very tachypneic post operatively with moderate amount of secretions. SICU MDs aware. Will continue to closely monitor.   Problem: Adult Inpatient Plan of Care  Goal: Plan of Care Review  Outcome: Ongoing (see interventions/notes)  Flowsheets (Taken 05/03/2018 1952)  Plan of Care Reviewed With:   patient   family  Progress: no change  Goal: Patient-Specific Goal (Individualized)  Outcome: Ongoing (see interventions/notes)  Flowsheets (Taken 05/03/2018 1952)  Individualized Care Needs: Q2 hr turn and skin injury prevention  Anxieties, Fears or Concerns: "do you think he will wake up more"- per patient's sister  Patient-Specific Goals (Include Timeframe): "for mental status to improve" patient's sister  Goal: Absence of Hospital-Acquired Illness or Injury  Outcome: Ongoing (see interventions/notes)  Goal: Optimal Comfort and Wellbeing  Outcome: Ongoing (see interventions/notes)     Problem: Communication Impairment (Mechanical Ventilation, Invasive)  Goal: Effective Communication  Outcome: Ongoing (see interventions/notes)     Problem: Device-Related Complication Risk (Mechanical Ventilation, Invasive)  Goal: Optimal Device Function  Outcome: Ongoing (see interventions/notes)     Problem: Inability to Wean (Mechanical Ventilation, Invasive)  Goal: Mechanical Ventilation Liberation  Outcome: Ongoing (see interventions/notes)     Problem: Nutrition  Impairment (Mechanical Ventilation, Invasive)  Goal: Optimal Nutrition Delivery  Outcome: Ongoing (see interventions/notes)     Problem: Skin and Tissue Injury (Mechanical Ventilation, Invasive)  Goal: Absence of Device-Related Skin and Tissue Injury  Outcome: Ongoing (see interventions/notes)     Problem: Ventilator-Induced Lung Injury (Mechanical Ventilation, Invasive)  Goal: Absence of Ventilator-Induced Lung Injury  Outcome: Ongoing (see interventions/notes)     Problem: Fall Injury Risk  Goal: Absence of Fall and Fall-Related Injury  Outcome: Ongoing (see interventions/notes)     Problem: Non-violent/Non-Self Destructive Restraints  Goal: Alternative methods tried prior to restraints  Outcome: Ongoing (see interventions/notes)  Goal: Patient free from injury and discomfort  Outcome: Ongoing (see interventions/notes)  Goal: Autonomy maintained at the highest possible level  Outcome: Ongoing (see interventions/notes)  Goal: Need for restraints reassessed per policy  Outcome: Ongoing (see interventions/notes)  Goal: Patient education provided  Outcome: Ongoing (see interventions/notes)  Goal: Problem Interventions  Outcome: Ongoing (see interventions/notes)     Problem: Skin Injury Risk Increased  Goal: Skin Health and Integrity  Outcome: Ongoing (see interventions/notes)

## 2018-05-03 NOTE — Progress Notes (Signed)
SICU PROGRESS NOTE    Antonio Gillespie  Date of Admission:  04/28/2018  Date of Service: 05/03/2018  Date of Birth:  [birth year]    Primary Attending: Harvin Hazel, Anderson Malta, *  Primary Service:  TRAUMA BLUE SIC     LOS: 5 days      Events Last 24 hours:   DC central line and femoral arterial line,  Placed radial arterial line    Subjective:    Still intubated on Ativan taper. Had one episode of agitation requiring PRN Haldol.     Vital Signs:  Temp (24hrs) Max:37.1 C (88.4 F)      Systolic (16SAY), TKZ:601 , Min:96 , UXN:235     Diastolic (57DUK), GUR:42, Min:61, Max:100    Temp  Avg: 36.9 C (98.5 F)  Min: 36.8 C (98.2 F)  Max: 37.1 C (98.8 F)  MAP (Non-Invasive)  Avg: 87.6 mmHG  Min: 71 mmHG  Max: 110 mmHG  Pulse  Avg: 108.3  Min: 95  Max: 136  Resp  Avg: 24.4  Min: 17  Max: 76  SpO2  Avg: 99 %  Min: 97 %  Max: 100 %  Pain Score (Numeric, Faces): Other    Meds  acetaminophen (TYLENOL) 160 mg per 5 mL liquid - grape flavored, 650 mg, Gastric (NG, OG, PEG, GT), Q4H PRN  aspirin chewable tablet 81 mg, 81 mg, Gastric (NG, OG, PEG, GT), Daily  chlorhexidine gluconate (PERIDEX) 0.12% mouthwash, 15 mL, Swish & Spit, 2x/day  dexmedetomidine (PRECEDEX) 400 mcg in NS 174m premix infusion, 0.3 mcg/kg/hr (Ideal), Intravenous, Continuous  enoxaparin PF (LOVENOX) 60 mg/0.6 mL SubQ injection, 0.5 mg/kg, Subcutaneous, Q12H  famotidine (PEPCID) tablet, 20 mg, Gastric (NG, OG, PEG, GT), 2x/day  gabapentin (NEURONTIN) 3012mper 47m69mral liquid, 300 mg, Gastric (NG, OG, PEG, GT), 3x/day  haloperidol (HALDOL) 5 mg/mL injection, 5 mg, Intravenous, Q6H PRN  ipratropium-albuterol 0.5 mg-3 mg(2.5 mg base)/3 mL Solution for Nebulization, 3 mL, Nebulization, Q6H  lanolin-oxyquin-pet, hydrophil (BAG BALM) topical ointment, , Apply Topically, 2x/day PRN  LORazepam (ATIVAN) tablet, 1 mg, Gastric (NG, OG, PEG, GT), Q8H    Followed by  LORazepam  (ATIVAN) tablet, 1 mg, Gastric (NG, OG, PEG, GT), Q12H    Followed by  [START ON 05/04/2018] LORazepam (ATIVAN) tablet, 1 mg, Gastric (NG, OG, PEG, GT), NIGHTLY  NS flush syringe, 2 mL, Intracatheter, Q8HRS    And  NS flush syringe, 2-6 mL, Intracatheter, Q1 MIN PRN  nutrition protein supplement 15 g per 30 mL packet, 2 Packet, Gastric (NG, OG, PEG, GT), Daily  polyethylene glycol (MIRALAX) oral packet, 17 g, Gastric (NG, OG, PEG, GT), Daily  prenatal vitamin-iron-folic acid tablet, 1 Tab, Gastric (NG, OG, PEG, GT), Daily  senna concentrate (SENNA) 528m47mr 15mL61ml liquid, 5 mL, Gastric (NG, OG, PEG, GT), 2x/day  SSIP insulin R human (HUMULIN R) 100 units/mL injection, 0-12 Units, Subcutaneous, Q4H PRN  thiamine-vitamin B1 (BETAXIN) tablet, 100 mg, Gastric (NG, OG, PEG, GT), Daily        Physical Exam:   Filed Vitals:    05/03/18 0700 05/03/18 0733 05/03/18 0800 05/03/18 0900   BP: (!) 131/100  126/80 (!) 133/91   Pulse: (!) 117  (!) 123 (!) 136   Resp: (!) 76  (!) 26 (!) 28   Temp:   36.8 C (98.2 F)    SpO2:  100% 97% 99%        GEN:  Intubated, appears somewhat agitated  HEENT: Normocephalic  NECK:  C-collar in place  PULM:   Lung sounds clear to auscultation bilaterally.  Normal respiratory effort.   CV:   Regular rate and rhythm  ABD:   Abdomen soft, nontender, and nondistended. L inguinal hernia  MS: All 4 extremities present, moving all 4 extremities.   NEURO:   Intubated responsive to painful stimuli, GCS 6T  Vascular:  All pulses palpable and equal bilaterally  Integumentary:  Pink, warm, and dry  PSYCHOSOCIAL: Pleasant.  Normal affect.      Labs:  Lab Results Today:    Results for orders placed or performed during the hospital encounter of 04/28/18 (from the past 24 hour(s))   POC BLOOD GLUCOSE (RESULTS)   Result Value Ref Range    GLUCOSE, POC 127 (H) 70 - 105 mg/dl   ARTERIAL BLOOD GAS   Result Value Ref Range    %FIO2 (ARTERIAL) 30 %    BICARBONATE (ARTERIAL) 24.5 18.0 - 26.0 mmol/L    PCO2 (ARTERIAL)  43.0 35.0 - 45.0 mm/Hg    PH (ARTERIAL) 7.37 7.35 - 7.45    PO2 (ARTERIAL) 139.0 (H) 72.0 - 100.0 mm/Hg    BASE DEFICIT 0.6 0.0 - 3.0 mmol/L    PAO2/FIO2 RATIO 463 <=200   POC BLOOD GLUCOSE (RESULTS)   Result Value Ref Range    GLUCOSE, POC 139 (H) 70 - 105 mg/dl   BASIC METABOLIC PANEL   Result Value Ref Range    SODIUM 140 136 - 145 mmol/L    POTASSIUM 4.3 3.5 - 5.1 mmol/L    CHLORIDE 111 96 - 111 mmol/L    CO2 TOTAL 24 22 - 32 mmol/L    ANION GAP 5 4 - 13 mmol/L    CALCIUM 7.3 (L) 8.5 - 10.2 mg/dL    GLUCOSE 128 65 - 139 mg/dL    BUN 9 8 - 25 mg/dL    CREATININE 0.53 (L) 0.62 - 1.27 mg/dL    BUN/CREA RATIO 17 6 - 22    ESTIMATED GFR >60 >60 mL/min/1.63m2   MAGNESIUM   Result Value Ref Range    MAGNESIUM 2.0 1.6 - 2.6 mg/dL   PHOSPHORUS   Result Value Ref Range    PHOSPHORUS 2.9 2.4 - 4.7 mg/dL   CBC   Result Value Ref Range    WBC 4.5 3.7 - 11.0 x10^3/uL    RBC 2.86 (L) 4.50 - 6.10 x10^6/uL    HGB 7.6 (L) 13.4 - 17.5 g/dL    HCT 24.9 (L) 38.9 - 52.0 %    MCV 87.1 78.0 - 100.0 fL    MCH 26.6 26.0 - 32.0 pg    MCHC 30.5 (L) 31.0 - 35.5 g/dL    RDW-CV 23.3 (H) 11.5 - 15.5 %    PLATELETS 128 (L) 150 - 400 x10^3/uL    MPV 11.8 8.7 - 12.5 fL   ARTERIAL BLOOD GAS/LACTATE   Result Value Ref Range    %FIO2 (ARTERIAL) 30 %    PH (ARTERIAL) 7.42 7.35 - 7.45    PCO2 (ARTERIAL) 37.0 35.0 - 45.0 mm/Hg    PO2 (ARTERIAL) 108.0 (H) 72.0 - 100.0 mm/Hg    BASE DEFICIT 0.2 0.0 - 3.0 mmol/L    BICARBONATE (ARTERIAL) 24.8 18.0 - 26.0 mmol/L    LACTATE 1.1 0.0 - 1.3 mmol/L    PAO2/FIO2 RATIO 360 <=200   POC BLOOD GLUCOSE (RESULTS)   Result Value Ref Range    GLUCOSE, POC 108 (H) 70 - 105 mg/dl  I/O's:   Date 05/03/18 0000 - 05/03/18 2359   Shift 0000-0759 3664-4034 1600-2359 24 Hour Total   INTAKE   I.V. _0 AL/PA/LA/RA/CVP/IABP/CO _1 OG 480 120  600     Intake - Volume infused (Oral Gastric Tube) 480 120  600   Shift Total 504 126  630   OUTPUT   Urine 945 400  1345     Output (Foley Catheter) 945 400  1345      Shift Total 945 400  1345   NET -441 -274  -715         Recent Imaging:  Results for orders placed or performed during the hospital encounter of 04/28/18 (from the past 72 hour(s))   XR AP MOBILE CHEST     Status: None    Narrative    Antonio Gillespie  Male, 58 years old.    XR AP MOBILE CHEST performed on 05/01/2018 5:16 AM.    REASON FOR EXAM:  Follow up CXR trauma pt    TECHNIQUE: 1 views/1 images submitted for interpretation.    COMPARISON:  04/30/2018    FINDINGS:  The heart is at the left upper limits of normal for size. There  is bibasilar atelectasis. No visible pneumothorax.    Support devices:  Endotracheal tube, enteric tube, unchanged.      Impression    Mild bibasilar atelectasis, unchanged.     XR AP MOBILE CHEST     Status: None    Narrative    BEATRIZ SETTLES  Male, 58 years old.    XR AP MOBILE CHEST performed on 05/02/2018 10:05 AM.    REASON FOR EXAM:  Follow up respiratory failure    TECHNIQUE: 1 view/1 image(s) submitted for interpretation.    FINDINGS: Compared to 05/01/2018. The heart is enlarged and there are  central vascular congestion. The lungs are clear. Endotracheal tube and  enteric tube are noted. I cannot visualize the distal aspect of the enteric  tube.        Impression    Cardiomegaly without acute pulmonary process.     MRI TRAUMA CERVICAL SPINE WO CONTRAST     Status: None    Narrative    BEECHER FURIO  Male, 58 years old.    MRI TRAUMA CERVICAL SPINE WO CONTRAST performed on 05/02/2018 10:38 PM.    REASON FOR EXAM:  Collar clearance in intubated patient      COMPARISON: Outside CT cervical spine 04/27/2018    TECHNIQUE: Coronal STIR, sagittal T1-, T2-, and axial T1-w, GRE and T2-w  images of the cervical spine. Additional sagittal STIR and GRE images are  of trauma protocol.    FINDINGS: No evidence of marrow edema is identified to suggest a fracture.  However there is edema noted to the posterior right paraspinal soft tissues  noted at the C7 and T1 levels. 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  There is prevertebral soft tissue edema identified at the C1 and C2 level  which is nonspecific in an intubated patient. However The ligaments appear  intact. 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 This results in contouring  of the cervical spinal cord at the C3-C4, C4-C5 levels. Evaluation of the  individual levels demonstrates:  C2-3: No spinal canal or foraminal stenosis    C3-4: Disc bulging along with endplate osteophyte formation, uncovertebral  facet degeneration worse on the right side there is moderate spinal canal  and moderate to severe right foraminal stenosis.    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 Prevertebral soft tissue swelling noted along the  upper cervical levels which is nonspecific in an intubated patient. No  fractures nor ligamentous injury is appreciated to the cervical levels.     XR AP MOBILE CHEST     Status: None    Narrative    KERRICK MILER  Male, 58 years old.    XR AP MOBILE CHEST performed on 05/03/2018 4:50 AM.    REASON FOR EXAM:  Follow up respiratory failure    TECHNIQUE: 1 views/1 images submitted for interpretation.    COMPARISON:  05/02/2018    FINDINGS:  The heart is enlarged. There is mild interstitial opacity in the  right lung, unchanged from the  previous exam. No focal consolidation,  pleural effusion, or pneumothorax is identified.    Support devices:  Endotracheal tube, enteric tube, unchanged.      Impression    Stable mild interstitial opacity in the right lung.         Assessment/ Plan:  Active Hospital Problems    Diagnosis   . Primary Problem: Fall   . COPD (chronic obstructive pulmonary disease) (CMS HCC)   . Thrombocytopenia Unspecified   . Hypokalemia   . Alcohol abuse   . Troponin I above reference range   . Acute respiratory failure (CMS HCC)   . Nasal fracture   . Abrasions of multiple sites   . Scalp hematoma   . Humeral fracture   . UTI (urinary tract infection)   . Left inguinal hernia   . Anasarca       AMIN FORNWALT is a 58 y.o. male with PMH of atrial fibrillation, COPD, EtOH abuse who was transferred from outside facility. Per chart review, family of patient had not heard from him and called the police who found him in his home lying face down and covered in urine/feces.Patient was intubated at outside facility ED. Pan CT scan revealed a comminuted right humeral neck fracture, pulmonary artery enlargement, large left inguinal hernia. CT head showed bilateral scalp hematomas. CT c-spine negative. XR right hand and forearm were negative for fractures    Lines:   Patient Lines/Drains/Airways Status    Active Line / Dialysis Catheter / Dialysis Graft / Drain / Airway / Wound     Name: Placement date: Placement time: Site: Days:    Peripheral IV Ultrasound guided;Extended dwell catheter Right Forearm  05/01/18   1100   1    Peripheral IV Ultrasound guided;Extended dwell catheter Left;Lower Forearm  05/01/18   1400   1    Peripheral IV Extended dwell catheter;Ultrasound guided Left Forearm  05/01/18   1400   1    Arterial Line Right Radial  05/01/18   1425  Radial  1    Oral Gastric Tube  04/28/18   1600   4    Foley Catheter  04/28/18   1615   4    EndoTracheal Tube Oral 7.5 23 cm Lip  -   -       Wound (Non-Surgical) Left Chest  04/28/18    1600   4    Wound (Non-Surgical) Left;Right Toe  04/28/18   1600   4    Wound (Non-Surgical) Left Mouth  04/28/18   1600   4    Wound (Non-Surgical) Left;Right Knee  04/28/18   1600   4                   NEURO:  GCS: E4=Spontaneous (Opens Eyes on Own) M4=Withdraws To Pain V1=None (Makes No Noise or Intubated)T   CTA Imaging demonstrated mild stenosis at origin of right vertebral artery and atherosclerosis without evidence of traumatic injury  MRI brain showed no actue infarcts or intracranial mass. 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   Sedation/analgesia: Ativan Taper 68m Q8hrs x 5 and then 159mQ8hr x 6 starting on 3/17 and ending on 3/19 for suspected alcohol withdrawal, reported patient drinks approximately 30 beers/day  IV and PO Thiamine  EEG negative     CARDIOVASCULAR:  Systolic (2493GHW AvEXH:371 Min:96 , MaIRC:789   Diastolic (2438BOF AvBPZ:02Min:61, Max:100  HR 96-124     ART-Line  MAP: 99 mmHg   Troponins: No results found for: CKMB   Meds: none  Pressors: none        PULMONARY:    Airway Ventilator Settings   EndoTracheal Tube Oral 7.5 23 cm Lip (Active)   Airway Secure Device 05/01/2018  4:18 AM   Position Change Yes 05/01/2018  4:18 AM   Change Reason Routine 05/01/2018  4:18 AM   Bilateral General Breath Sounds Coarse 04/28/2018  3:51 PM    Ventilator Settings:  Mode: SPONT  Set PEEP: 5 cmH2O  Pressure Support: 8 cmH2O  FiO2: 30 %  I:E Ratio: 1:1.5     Weaning Parameters: Min. Volume: 7.2 Liters (05/03/18 0500)  Resp Rate: 23 Breath Per Minute (05/03/18 0500)  Avg. VT: 313.04 mL (05/03/18 0500)  FVC: (UTD did not follow) (05/03/18 0500)  NIF: -30 cmH2O (05/03/18 0500)  RSBI: 73 (05/03/18 0500)  Cuff Leaked Assessment: Leak Present;Good Airflow Aucustulated (05/03/18 0500)  SpO2  Avg: 99 %  Min: 97 %  Max: 100 %  Blood Gas:  Recent Labs     05/02/18  1650 05/03/18  0444   FI02 30 30   PH 7.37 7.42   PCO2 43.0 37.0   PO2 139.0* 108.0*   BICARBONATE 24.5  24.8   BASEDEFICIT 0.6 0.2     Nebs: none  Imaging: CXR today showed new R sided effusion vs atelectasis compared to yesterday   Will go to OR today for tracheostomy    GI:  MNT PROTOCOL FOR DIETITIAN  ADULT TUBE FEEDING - CONTINUOUS DRIP CONTINUOUS NO MEALS, TF ONLY; OSMOLITE 1.5; OG; Initial Rate (ml/hr): 20; Increase by: 10 ml every 6 hours; Goal Rate (ml/hr): 60  DIET NPO - NOW INCLUDING TUBE FEEDS   No results for input(s): PREALBUMIN, BILIRUBIN, ALBUMIN, AMYLASE, LIPASE in the last 72 hours.    Invalid input(s): PHOSPHATASE, TRANSAMINASE  Last BM: Last Bowel Movement: 05/02/18  Proph: Senokot, Miralax Pepcid    RENAL/GU:  Recent Labs  05/01/18  1327 05/02/18  0033 05/02/18  0853 05/02/18  1650 05/03/18  0043 05/03/18  0444   SODIUM 140 141  --   --  140  --    POTASSIUM 3.3* 3.3* 3.4*  --  4.3  --    CHLORIDE 105 111  --   --  111  --    BICARBONATE 29.4 26.7*  --  24.5  --  24.8   BUN 5* 6*  --   --  9  --    CREATININE 0.50* 0.52*  --   --  0.53*  --    ANIONGAP 6 4  --   --  5  --    CALCIUM 7.4* 7.4*  --   --  7.3*  --    MAGNESIUM 2.2 2.1  --   --  2.0  --    PHOSPHORUS 2.4 2.5  --   --  2.9  --      urine output 75-150cc/hr since MN    I/O last 24 hours:      Intake/Output Summary (Last 24 hours) at 05/03/2018 0956  Last data filed at 05/03/2018 0900  Gross per 24 hour   Intake 2052 ml   Output 2480 ml   Net -428 ml     I/O current shift:  03/18 0700 - 03/18 1859  In: 189 [I.V.:9; OG:180]  Out: 550 [Urine:550]  Foley in critically ill pt for strict I/O's  IV fluids: none   Diuretics:none    HEME:  Recent Labs     05/01/18  0107 05/02/18  0033 05/03/18  0043   HGB 7.9* 7.1* 7.6*   HCT 26.0* 23.6* 24.9*   PLTCNT 117* 112* 128*     Transfusions:   Proph: SCD's, Lovenox    ID:  Temp (24hrs) Max:37.1 C (98.8 F)    Recent Labs     05/01/18  0107 05/02/18  0033 05/03/18  0043   WBC 4.3 3.9 4.5     Blood cultures: NGTD  Urine cultures:  NGTD  BAL: Staphylococcus species, MRSA negative  OR cultures:   none  ABX: Nitrofurantoin for UTI (stop date 05/01/2018)    ENDO:  Recent Labs     05/01/18  1126 05/01/18  1725 05/02/18  0043 05/02/18  0607 05/02/18  1234 05/02/18  1744 05/03/18  0453   GLUIP 131* 91 114* 145* 127* 139* 108*     SSI    MSK:  No skin breakdown  ENT consulted for nasal bone fractures, non-operative   Orthopedics consulted for proximal humerus fracture, likely non operative    OTHER:  Activity: Bedrest, HOB 30deg  PT/OT:  ordered  MNT:  ordered    PLAN:  Neuro: Continue ativan taper, Thiamine supplementation. Haldol PRN and Gabapentin. Avoid versed. Start daily 81 mg ASA. MRI C-spine completed, awaiting read to clear c-collar  Resp: Spontaneous PS, will go to OR today for trach/peg  Recent Labs     05/02/18  0033 05/02/18  1650 05/03/18  0444   PH 7.48* 7.37 7.42   PCO2 34.0* 43.0 37.0   PO2 112.0* 139.0* 108.0*   BASEEXCESS 2.2*  --   --    BASEDEFICIT  --  0.6 0.2     CVS: Maintain MAP > 65  GI: NG tube in place, cont bowel reg, famotidine for ppx. Tube feeds held for OR today, to get Trach/PEG  Renal: Foley for strict I&O in critically ill patient.   Recent  Labs     05/01/18  1126 05/01/18  1725 05/02/18  0043 05/02/18  0607 05/02/18  1234 05/02/18  1744 05/03/18  0453   GLUIP 131* 91 114* 145* 127* 139* 108*     Heme: H/H  DVT ppx- SCDs, Lovenox  ID: afebrile  Other: Activity- HOB 30, OOB, PT/OT       Dimple Nanas, MD  05/01/2018, 06:01  Department of Surgery  PGY-1  Pager [phone]    ______________________________________________________________________________  SICU Attending    I saw and examined the patient.  I reviewed the resident's note.  I agree with the findings and plan of care as documented in the resident's note.  Any exceptions/additions are edited/noted.    Increasingly agitated this morning.  He is off his Precedex drip although I am unclear why.  Continue his Ativan taper and Haldol p.r.n. For agitation and withdrawal.  Acute respiratory failure persists:  Mental status precludes  extubation.  Primary planning for trach today.  Expect to be able to transition to aerosol trach mask pretty quickly.  Resume tube feeds postop.  His MRI was reviewed the collar was removed.  Chest x-ray was personally reviewed, my impression:  Stable aeration of the lungs bilaterally.      Critical Care Attestation    I was present at the bedside of this critically ill patient for 35 minutes exclusive of procedures.  This patient suffers from failure or dysfunction of Neurologic/Sensory, Pulmonary and GI/Hepatopancreaticobiliary system(s).  The care of this patient was in regard to managing (a) conditions(s) that has a high probability of sudden, clinically significant, or life-threatening deterioration and required a high degree of Attending Physician attention and direct involvement to intervene urgently. Data review and care planning was performed in direct proximity of the patient, examination was obviously performed in direct contact with the patient. All of this time was exclusive of procedure which will be documented elsewhere in the chart.    My critical care time is independent and unique to other providers (no other providers saw patient for purposes of sicu evaluation)  My critical care time involved full attention to the patients' condition and included:    Review of nursing notes and/or old charts  Review of medications, allergies, and vital signs  Documentation time  Consultant collaboration on findings and treatment options  Care, transfer of care, and discharge plans  Ordering, interpreting, and reviewing diagnostic studies/tab tests  Obtaining necessary history from family, EMS, nursing staff and/or treating physicians    My critical care time did not include time spent teaching resident physician(s) or other services of resident physicians, or performing other reported procedures.  Total Critical Care Time: 35 minutes    Ann Held, MD

## 2018-05-03 NOTE — Nurses Notes (Signed)
Pt return from OR with OR RN escort and CRNA. Primary RN at bedside. Assessment and vitals per flowsheet, meds per [facility], labs pending.   SICU and RT at bedside.

## 2018-05-03 NOTE — OR Nursing (Signed)
Patient's obturator given to SICU nurse

## 2018-05-03 NOTE — Progress Notes (Signed)
[facility]                                                      Trauma Progress Note                 Date of Birth:  September 01, 1960  Date of Admission:  04/28/2018  Date of service: 05/03/2018    Antonio Gillespie, 58 y.o., male Post trauma day 5 status post Fall    Events over the last 24 hours have included:  Poor parameters on SBT yesterday  Consent obtained for trach/PEG with HCS    Subjective:    Patient remains intubated. Appears to open eyes to voice and attend.     Objective   24 Hour Summary:    Filed Vitals:    05/03/18 1019 05/03/18 1100 05/03/18 1200 05/03/18 1300   BP:   115/72 104/67   Pulse:  (!) 135 (!) 123 (!) 115   Resp:  (!) 35 (!) 32 (!) 27   Temp:   (!) 38.2 C (100.8 F)    SpO2: 99% 96% 96% 97%     Labs:  Recent Labs     05/01/18  0107  05/01/18  1327 05/02/18  0033 05/02/18  0853 05/02/18  1650 05/03/18  0043 05/03/18  0444   WBC 4.3  --   --  3.9  --   --  4.5  --    HGB 7.9*  --   --  7.1*  --   --  7.6*  --    HCT 26.0*  --   --  23.6*  --   --  24.9*  --    SODIUM  --   --  140 141  --   --  140  --    POTASSIUM  --   --  3.3* 3.3* 3.4*  --  4.3  --    CHLORIDE  --   --  105 111  --   --  111  --    BICARBONATE  --    < > 29.4 26.7*  --  24.5  --  24.8   BUN  --   --  5* 6*  --   --  9  --    CREATININE  --   --  0.50* 0.52*  --   --  0.53*  --    ANIONGAP  --   --  6 4  --   --  5  --    CALCIUM  --   --  7.4* 7.4*  --   --  7.3*  --    MAGNESIUM  --   --  2.2 2.1  --   --  2.0  --    PHOSPHORUS  --   --  2.4 2.5  --   --  2.9  --     < > = values in this interval not displayed.       Intake/Output Summary (Last 24 hours) at 05/03/2018 1317  Last data filed at 05/03/2018 1300  Gross per 24 hour   Intake 1202 ml   Output 2940 ml   Net -1738 ml     Nutrition Management: MNT PROTOCOL FOR DIETITIAN  ADULT TUBE FEEDING - CONTINUOUS DRIP CONTINUOUS NO MEALS, TF ONLY; OSMOLITE  1.5; OG; Initial Rate (ml/hr): 20; Increase by: 10 ml every 6 hours; Goal Rate (ml/hr): 60  DIET NPO - NOW INCLUDING  TUBE FEEDS Last Bowel Movement: 05/02/18  No results for input(s): ALBUMIN, PREALBUMIN in the last 72 hours.  Current Medications:    Current Facility-Administered Medications:   .  acetaminophen (TYLENOL) 160 mg per 5 mL liquid - grape flavored, 650 mg, Gastric (NG, OG, PEG, GT), Q4H PRN, Thimm, Terra, MD, 650 mg at 04/29/18 0316  .  aspirin chewable tablet 81 mg, 81 mg, Gastric (NG, OG, PEG, GT), Daily, McCoy, Skyler, MD, 81 mg at 05/03/18 0916  .  ceFAZolin (ANCEF) IV injection 2 g, 2 g, Intravenous, PRE-OP Once, Estevan RyderAhmad, Mirza N, MD  .  chlorhexidine gluconate (PERIDEX) 0.12% mouthwash, 15 mL, Swish & Spit, 2x/day, Kasandra KnudsenKnight Davis, Victorino DikeJennifer, MD, 15 mL at 05/03/18 0916  .  dexmedetomidine (PRECEDEX) 400 mcg in NS 100mL premix infusion, 0.3 mcg/kg/hr (Ideal), Intravenous, Continuous, McCoy, Skyler, MD, Stopped at 05/03/18 1148  .  enoxaparin PF (LOVENOX) 60 mg/0.6 mL SubQ injection, 0.5 mg/kg, Subcutaneous, Q12H, Babs SciaraBardes, Carold, MD, 50 mg at 05/03/18 1053  .  famotidine (PEPCID) tablet, 20 mg, Gastric (NG, OG, PEG, GT), 2x/day, Kasandra KnudsenKnight Davis, Victorino DikeJennifer, MD, 20 mg at 05/03/18 0916  .  gabapentin (NEURONTIN) 300mg  per 6mL oral liquid, 300 mg, Gastric (NG, OG, PEG, GT), 3x/day, Babs SciaraBardes, Torence, MD, 300 mg at 05/03/18 0916  .  haloperidol (HALDOL) 5 mg/mL injection, 5 mg, Intravenous, Q6H PRN, Carlynn HeraldBooth, Michael, MD, 5 mg at 05/03/18 0042  .  HYDROmorphone (DILAUDID) 2 mg/mL injection, 0.2 mg, Intravenous, Q4H PRN **OR** HYDROmorphone (DILAUDID) 2 mg/mL injection, 0.4 mg, Intravenous, Q4H PRN, Carlynn HeraldBooth, Michael, MD, 0.4 mg at 05/03/18 1137  .  ipratropium-albuterol 0.5 mg-3 mg(2.5 mg base)/3 mL Solution for Nebulization, 3 mL, Nebulization, Q6H, McCoy, Skyler, MD, 3 mL at 05/03/18 0752  .  lanolin-oxyquin-pet, hydrophil (BAG BALM) topical ointment, , Apply Topically, 2x/day PRN, Michaelle BirksBalise, Stephen, MD  .  [COMPLETED] LORazepam (ATIVAN) 2 mg/mL injection, 2 mg, Intravenous, Q8H, 2 mg at 05/02/18 1414 **FOLLOWED BY** LORazepam (ATIVAN)  tablet, 1 mg, Gastric (NG, OG, PEG, GT), Q8H, 1 mg at 05/03/18 0649 **FOLLOWED BY** LORazepam (ATIVAN) tablet, 1 mg, Gastric (NG, OG, PEG, GT), Q12H **FOLLOWED BY** [START ON 05/04/2018] LORazepam (ATIVAN) tablet, 1 mg, Gastric (NG, OG, PEG, GT), NIGHTLY, Bardes, Rafiel, MD  .  INSERT & MAINTAIN PERIPHERAL IV ACCESS, , , UNTIL DISCONTINUED **AND** DRESSING CHANGE Saline Lock; DRY STERILE, , , PRN **AND** NS flush syringe, 2 mL, Intracatheter, Q8HRS, 2 mL at 05/03/18 0649 **AND** NS flush syringe, 2-6 mL, Intracatheter, Q1 MIN PRN, Dory Peruhung, Hansol, MD  .  nutrition protein supplement 15 g per 30 mL packet, 2 Packet, Gastric (NG, OG, PEG, GT), Daily, Michaelle BirksBalise, Stephen, MD, 30 g at 05/03/18 0916  .  polyethylene glycol (MIRALAX) oral packet, 17 g, Gastric (NG, OG, PEG, GT), Daily, Karma GreaserSprando, Daniel, MD, 17 g at 05/03/18 0916  .  prenatal vitamin-iron-folic acid tablet, 1 Tab, Gastric (NG, OG, PEG, GT), Daily, Babs SciaraBardes, Sadler, MD, 1 Tab at 05/03/18 [phone]  .  senna concentrate (SENNA) 528mg  per 15mL oral liquid, 5 mL, Gastric (NG, OG, PEG, GT), 2x/day, Kasandra KnudsenKnight Davis, Victorino DikeJennifer, MD, 176 mg at 05/03/18 0916  .  SSIP insulin R human (HUMULIN R) 100 units/mL injection, 0-12 Units, Subcutaneous, Q4H PRN, 2 Units at 05/03/18 1200 **AND** POCT WHOLE BLOOD GLUCOSE, , , Q6H, Carlynn HeraldBooth, Michael, MD  .  thiamine-vitamin B1 (BETAXIN) tablet,  100 mg, Gastric (NG, OG, PEG, GT), Daily, Babs Sciara, MD, 100 mg at 05/03/18 0917     Today's Physical Exam:  GEN:   Chronically ill appearing  HEENT:   Pupils equal but not reactive.  PULM:   Mechanically ventilated  CV:   Regular rate  ABD:   Soft, nondistended. Left inguinal hernia extending into scrotum, reducible, less prominent than previously.   NEURO:   GCS 3-4-1T, localizing with left upper extremity. Eyes opening to voice  Integumentary:  Feet and knees with scattered wounds, dry in nature, no surrounding erythema, dressings in place    Assessment/ Plan:   Active Hospital Problems   (*Primary  Problem)    Diagnosis   . *Fall     "found down" and taken to outside hospital     . COPD (chronic obstructive pulmonary disease) (CMS HCC)     Chronic   . Thrombocytopenia Unspecified   . Hypokalemia   . Alcohol abuse   . Troponin I above reference range   . Acute respiratory failure (CMS HCC)   . Nasal fracture     Acute vs chronic     . Abrasions of multiple sites   . Scalp hematoma   . Humeral fracture     nonop per ortho     . UTI (urinary tract infection)     At outside hospital. Repeat UA pending     . Left inguinal hernia     S/p reduction     . Anasarca     DVT prophylaxis:  Enoxaparin and SCDs/ Venodynes/Impulse boots  Anticoagulants (last 24 hours)     Date/Time Action Medication Dose    04/29/18 0827 Given    enoxaparin PF (LOVENOX) 40 mg/0.4 mL SubQ injection 40 mg    04/28/18 2020 Given    enoxaparin PF (LOVENOX) 40 mg/0.4 mL SubQ injection 40 mg        Nutrition: MNT PROTOCOL FOR DIETITIAN  ADULT TUBE FEEDING - CONTINUOUS DRIP CONTINUOUS NO MEALS, TF ONLY; OSMOLITE 1.5; OG; Initial Rate (ml/hr): 20; Increase by: 10 ml every 6 hours; Goal Rate (ml/hr): 60  DIET NPO - NOW INCLUDING TUBE FEEDS diet, Last Bowel Movement: 05/02/18  Activity: No limitations    Pain:   Analgesics (last 24 hours)     Date/Time Action Medication Dose    04/29/18 0316 Given    acetaminophen (TYLENOL) 160 mg per 5 mL liquid - grape flavored 650 mg    04/28/18 1720 Given    fentaNYL (SUBLIMAZE) 50 mcg/mL injection 100 mcg    04/28/18 1648 Given    fentaNYL (SUBLIMAZE) 50 mcg/mL injection 50 mcg    04/28/18 1633 Given    fentaNYL (SUBLIMAZE) 50 mcg/mL injection 100 mcg        PT Recommendations:  Pending    Plan:     Poor neurological exam   - EEG - no seizures   - NCCU consult   - CTA intra/extracranial - no significant findings   - MRI with nonspecific white matter changes   - MRI C-spine negative, collar removed    Left inguinal hernia   - Reducible at bedside   - Continue to monitor for overlying skin changes   - Will attempt  daily reduction    Right humeral neck fracture   - Ortho consulted   - No operative intervention at this time   - Sling as needed    Nasal bone fracture   - ENT consult   -  Will re-eval when more awake    Bilateral lower extremity wounds   - ABI/PVR obtained, normal   - Wound care consult    Plan for trach/PEG today  Ok to restart tube feeds after OR  Continue DVT prophylaxis    Gerhard Munch, MD  05/03/2018, 18:14      Late entry for 05/03/18. I saw and examined the patient.  I reviewed the resident's note.  I agree with the findings and plan of care as documented in the resident's note.  Any exceptions/additions are edited/noted.    Mallie Mussel, MD

## 2018-05-03 NOTE — Nurses Notes (Signed)
Patient had 10 beat run of VTACH. Currently tachypneic in the 30s and tachycardic in mid 120's. SICU MD (Dr. Babs Sciara Thimm) notified. Orders for CXR at this time. Will continue to closely monitor.

## 2018-05-03 NOTE — Nurses Notes (Signed)
Patient to OR via bed with CRNA and OR RN escort. SBAR report given. Consents taken to OR. Will continue to closely monitor.

## 2018-05-03 NOTE — Consults (Signed)
[facility]  DEPARTMENT OF OTOLARYNGOLOGY - HEAD AND NECK SURGERY  INPATIENT PROGRESS NOTE             Current Date: 05/03/2018, 13:35  Name: Antonio Gillespie, 58 y.o. male  MRN: [id number]  DOB: 1960/08/08  Date of Admission: 04/28/2018    SUBJECTIVE:  Patient tachycardic overnight. ENT consulted to assess left oral commissure and left upper lip.    OBJECTIVE:   Vitals:   Temp (24hrs) Max:38.2 C (100.8 F)  Temperature: (!) 38.2 C (100.8 F)  BP (Non-Invasive): 104/67  MAP (Non-Invasive): 77 mmHG  Heart Rate: (!) 115  Respiratory Rate: (!) 27  SpO2: 97 %   Today's Physical Exam:  NAD, breathing comfortably.  Left commissure and upper lip necrotic eschar measuring 0.5cm in length    I/O:    I/O: Last 24 hours  03/17 0700 - 03/18 0659  In: 2222 [P.O.:300; I.V.:612; OG:1310]  Out: 2240 [Urine:2240]    I/O: Last Shift  03/18 0700 - 03/18 1859  In: 201 [I.V.:21; OG:180]  Out: 1235 [Urine:1235]        Labs/Imaging/Studies:  Lab Results   Component Value Date    WBC 4.5 05/03/2018    HGB 7.6 (L) 05/03/2018    HCT 24.9 (L) 05/03/2018    PLTCNT 128 (L) 05/03/2018    PMNS 88 04/30/2018    PMNS 79 04/28/2018    BANDS 2 04/30/2018       Recent Labs     05/01/18  1327 05/02/18  0033 05/02/18  0853 05/02/18  1650 05/03/18  0043 05/03/18  0444   SODIUM 140 141  --   --  140  --    POTASSIUM 3.3* 3.3* 3.4*  --  4.3  --    CHLORIDE 105 111  --   --  111  --    BICARBONATE 29.4 26.7*  --  24.5  --  24.8   BUN 5* 6*  --   --  9  --    CREATININE 0.50* 0.52*  --   --  0.53*  --    CALCIUM 7.4* 7.4*  --   --  7.3*  --    MAGNESIUM 2.2 2.1  --   --  2.0  --    PHOSPHORUS 2.4 2.5  --   --  2.9  --           ASSESSMENT & PLAN:  Antonio Gillespie is a 58 y.o. 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    Plan:   Left upper lip and commissure partially debrided and cleaned with placement of xeroform  Recommend alternating side of endotracheal tube to right  side of mouth, and protecting lip from pressure sites on endotracheal tube  Recommend discussion about need for tracheostomy tube if patient is anticipated to require prolonged intubation  Recommend wound care consult for continued care of upper lip ulceration secondary to pressure necrosis    Corky Sox, MD  05/03/2018, 13:36        I saw and examined the patient.  I reviewed the resident's note.  I agree with the findings and plan of care as documented in the resident's note.  Any exceptions/additions are edited/noted.    Marcene Duos, MD

## 2018-05-03 NOTE — Anesthesia Transfer of Care (Signed)
ANESTHESIA TRANSFER OF CARE   Antonio Gillespie is a 58 y.o. ,male, Weight: 91.9 kg (202 lb 9.6 oz)   had Procedure(s):  TRACHEOSTOMY  GASTROSCOPY WITH PLACEMENT PEG TUBE  performed  05/03/18   Primary Service: Odis Luster Dav*    Past Medical History:   Diagnosis Date   . Atrial fibrillation (CMS HCC)    . COPD (chronic obstructive pulmonary disease) (CMS HCC)    . ETOH abuse       Allergy History as of 05/03/18      No Known Allergies              I completed my transfer of care / handoff to the receiving personnel during which we discussed:  All key/critical aspects of case discussed, Antibiotics, Access, Fluids/Product, Labs, Airway, Analgesia, Gave opportunity for questions and acknowledgement of understanding, Expectation of post procedure and PMHx                                              Additional Info:Pt to SICU on all monitors with emergency equipment and medications available.  Pt ventilated via ambu bag, report to ICU nurse.                       Last OR Temp: Temperature: 38 C (100.4 F)  ABG:  PH (ARTERIAL)   Date Value Ref Range Status   05/03/2018 7.42 7.35 - 7.45 Final     PCO2 (ARTERIAL)   Date Value Ref Range Status   05/03/2018 37.0 35.0 - 45.0 mm/Hg Final     PO2 (ARTERIAL)   Date Value Ref Range Status   05/03/2018 108.0 (H) 72.0 - 100.0 mm/Hg Final     POTASSIUM   Date Value Ref Range Status   05/03/2018 4.3 3.5 - 5.1 mmol/L Final     KETONES   Date Value Ref Range Status   04/28/2018 Negative Negative mg/dL Final     CALCIUM   Date Value Ref Range Status   05/03/2018 7.3 (L) 8.5 - 10.2 mg/dL Final     Calculated P Axis   Date Value Ref Range Status   04/28/2018 50 degrees Final     Calculated R Axis   Date Value Ref Range Status   04/28/2018 40 degrees Final     Calculated T Axis   Date Value Ref Range Status   04/28/2018 -129 degrees Final     LACTATE   Date Value Ref Range Status   05/03/2018 1.1 0.0 - 1.3 mmol/L Final     BASE EXCESS (ARTERIAL)   Date Value Ref Range Status      05/02/2018 2.2 (H) 0.0 - 1.0 mmol/L Final     BASE DEFICIT   Date Value Ref Range Status   05/03/2018 0.2 0.0 - 3.0 mmol/L Final     BICARBONATE (ARTERIAL)   Date Value Ref Range Status   05/03/2018 24.8 18.0 - 26.0 mmol/L Final     Airway:  EndoTracheal Tube Oral 7.5 23 cm Lip (Active)   Airway Secure Device 05/03/2018 12:00 PM   Position Change Yes 05/03/2018 10:19 AM   Change Reason Routine 05/03/2018 10:19 AM   Bilateral General Breath Sounds Diminished;Clear 05/02/2018  4:00 PM       Tracheostomy 8 Cuffed (Active)   Trach Status Secured 05/03/2018  3:50 PM  Blood pressure 112/68, pulse (!) 117, temperature 38 C (100.4 F), resp. rate (!) 26, height 1.727 m (5\' 8" ), weight 91.9 kg (202 lb 9.6 oz), SpO2 96 %.

## 2018-05-03 NOTE — Nurses Notes (Addendum)
Tube feeds off per MD order. Will continue to closely monitor

## 2018-05-03 NOTE — OR Surgeon (Signed)
[facility]  Division of Trauma and Emergency Surgery  Operative Report    Date of Procedure: 05/03/2018    Pre Op Diagnosis:    1)  Respiratory failure  2)  Malnutrition     Post Op Diagnosis:  1)  Respiratory failure  2)  Malnutrition       Procedure:    1)  Tracheostomy   2)  EGD  3)  PEG placement    Surgeon: Mallie Mussel, MD    Assistants:  Ranae Palms, MD; Estevan Ryder, MD    Anesthesia: General endotracheal anesthesia    EBL:  Minimal     Complications:  None    Device/s Implanted: Yes - #8 Shiley cuffed tracheostomy tube, 20 French PEG tube    Procedure: The patient's identity was confirmed and a JCAHO timeout was performed.  The abdomen was prepped and draped in the usual sterile fashion.  A plastic bite block was placed carefully between the patient's teeth.  A flexible video gastroscope was then advanced into the hypopharynx, esophagus, and stomach.  Lesions, ulcers, and masses were not seen at these levels.  The stomach was insufflated with air and the anterior abdominal wall was transilluminated.  The intended insertion site 2 finger breadths caudal to the left costal margin was located at a site of transillumination.  Digital indentation at the intended insertion site was visible with the scope.  The insertion site was infiltrated with a local anesthetic. The needle was turned perpendicular to the skin and passed through the skin and stomach wall.  Air was aspirated at the same time the tip of the needle was visualized to enter the lumen of the stomach.  A 14 gauge IV catheter was advanced through the same tract with visual confirmation of entry into the gastric lumen.  A guidewire was passed through the catheter and snared intraluminally.  The guidewire was pulled retrograde through the upper GI tract and attached to the PEG tube.  The PEG and guidewire were pulled antegrade through the foregut until the flange of the PEG was seen to rest against the inner wall of  the stomach.  The tube easily rotated and an external bolster was placed.  The tube was at 2 cm at skin level.  The air within the stomach was aspirated as the scope was withdrawn.  The patient tolerated the procedure well.  All sponge, needle, and instrument counts were correct.      The neck was prepped and draped in the usual sterile fashion.  A longitudinal incision was made overlying the trachea.  Using electrocautery, the incision was deepened throught the platysma and soft tissue of the neck.  The thyroid was encountered and the isthmus hemostatically divided with electrocautery.  The anterior surface of the trachea was exposed.  A tracheal hook was used to elevate the trachea anteriorly into the wound.  With the cuff down, stay sutures of 2-0 Prolene were placed laterally on each side of the trachea.  The FIO2 was turned to 100%.  A #8 Shiley cuffed tracheostomy tube was tested and prepared for insertion.  The balloon of the endotracheal tube was deflated.  A trapdoor incision was made on the anterior surface of the trachea between the 2nd and 3rd tracheal rings.  The endotracheal tube was withdrawn until the tip was just above our tracheotomy.  The tracheostomy tube and obturator were advanced into the trachea.  The obturator was exchanged for the inner cannula.  The vent circuit was connected to the trach tube and end-tidal CO2 was detected confirming adequate ventilation.  #0 Prolene was used to secure the flange to the skin at four points.  Surgicel was not  placed into the wound.  A soft velcro trach tie was placed.  All sponge, needle, and instrument counts were correct at the conclusion of the case.      Dr Kasandra Knudsen was present for all key and/or critical portions of the case and immediately available at all times.      Estevan Ryder, MD 05/03/2018, 17:45  I was present for all key and/or critical portions of the case and immediately available at all times.      Mallie Mussel, MD 05/04/2018,  10:32          I was present for the entire viewing portion of the endoscopy from insertion to removal of scope.    Mallie Mussel, MD

## 2018-05-03 NOTE — Care Plan (Signed)
Respiratory Orders   (From admission, onward)   Start   Ordered   05/03/18 0600  INITIATE SPONTANEOUS AWAKENING TRIAL (INITIATE SAT/SBT TRIALS) DAILY (0600)   Comments: Complete safety screen for SAT and if passed proceed per protocol.   Duration: Until Specified    Priority: Routine        05/02/18 1834   05/03/18 0600  SBT completed. ABG good on 5cm psv/5cm CPAP, FIO2- 30%.Resp. rate 23 bpm,Ve 7.2 liters, NIF -30cm H2O.RSBI 73. UTD FVC did not follow. Good air flow cuff leak noted.                05/02/18 1834   05/02/18 1715  VENTILATOR - CPAP(PS) / SPONTANEOUS CONTINUOUS      Duration: Until Specified    Priority: Routine       Question Answer Comment   FIO2 (%) 30    Peep(cm/H2O) 5    Pressure Support(cm/H2O) 8    Indications IMPROVE DISTRIBUTION OF VENTILATION    Invasive/Non-Invasive INVASIVE        05/02/18 1707   04/30/18 1000  TRAUMA PULMONARY EVALUATION - AGGRESSIVE PULMONARY TOILET (TRAUMA AGGRESSIVE PULMONARY TOILET PANEL) BID (1000 AND 1800)   Duration: Until Specified    Priority: Routine       Question Answer Comment   Reason for Consult CHEST PT    Reason for Consult OTHER(Specify in Comments)  Wean O2       04/30/18 0818                    Respiratory Medications   (From admission, onward)   Start   Ordered Stop   04/29/18 1400  ipratropium-albuterol 0.5 mg-3 mg(2.5 mg base)/3 mL Solution for Nebulization 3 mL, Nebulization, EVERY 6 HOURS      03/14

## 2018-05-03 NOTE — Nurses Notes (Signed)
Restraint Continuation      Patient continues to have the following condition ETT AMS.       Least restrictive alternatives attempted : Family called, Increase patient observation, moved closer to the nurses station, bed-exit alarm/motion detector, concealed tubes/lines, decreased/removed stimulus, frequent orientation, involved patient in conversation, provided familiar/personal items, reoriented to person, place, time, and treatment, encouraged relaxation techniques, redirected behavior, used TV/music and assessed meds/medical problems    Patient continues to exhibit the following behaviors: agitated    The restraint continued to facilitate medical/surgical treatment to ensure safety.    The patient will continue to be evaluated and assessments documented on the flowsheet to ensure that the patient is released from the restraint at the earliest possible time

## 2018-05-03 NOTE — Nurses Notes (Signed)
Per trauma surgery Ellery Plunk) keep G tube to gravity for a couple of hours and then ok to use meds. No tube feeds until the a.m. Will continue to closely monitor.

## 2018-05-03 NOTE — Care Plan (Signed)
PT attempted to provide care to Antonio Gillespie today at 09:01. Care could not be delivered at that time due to patient not appropriate at this time. RN providing ROM.

## 2018-05-03 NOTE — Pharmacist Med Reconciliation (Signed)
Pharmacy Medication Reconciliation    Patient Name: Montrelle, Balzer  Date of Service: 05/03/2018  Date of Admission: 04/28/2018  Date of Birth: 05-24-1960  Length of Stay:   5 days       Transitions of Care:  1. Would you like to utilize the [facility]?   Unable to Assess -  If no, preferred pharmacy:  [facility]    Do you have a way to pay for your discharge prescription medications at the time of discharge?  [facility]    Information was collected from:  Pharmacy    Clarified Prior to Admission Medications:  Prior to Admission medications    Not on File         Changes made to Prior to Admission Med List:  No medications listed. Only Rx pt has had filled at [facility] in [place] was hydrocodone/hyosciamine for a short term supply in FEB 2019. No other medications located. Will continue to try to locate family who may know further medication history.    Allergies:  NKDA    Pharmacist Recommendations:   None.    Levonne Lapping, PHARMD

## 2018-05-03 NOTE — Nurses Notes (Addendum)
ENT at bedside to debride lip. Patient tolerated well. SICU aware. Will continue to closely monitor     Wound care NP also at bedside. Patient had purple areas to RUE around forearm. Stated it is just bruising as to why she did not take picture of it. Orders to put vaseline on lip per wound care, xeroform per ENT.     Will continue to care for wounds as ordered. Will continue to closely monitor.

## 2018-05-04 ENCOUNTER — Inpatient Hospital Stay (HOSPITAL_COMMUNITY): Payer: Medicaid Other

## 2018-05-04 DIAGNOSIS — J9690 Respiratory failure, unspecified, unspecified whether with hypoxia or hypercapnia: Secondary | ICD-10-CM

## 2018-05-04 LAB — ARTERIAL BLOOD GAS/LACTATE
%FIO2 (ARTERIAL): 30 %
%FIO2 (ARTERIAL): 30 %
BASE EXCESS (ARTERIAL): 2.1 mmol/L — ABNORMAL HIGH (ref 0.0–1.0)
BASE EXCESS (ARTERIAL): 1.5 mmol/L — ABNORMAL HIGH (ref 0.0–1.0)
BICARBONATE (ARTERIAL): 26.6 mmol/L — ABNORMAL HIGH (ref 18.0–26.0)
BICARBONATE (ARTERIAL): 26.1 mmol/L — ABNORMAL HIGH (ref 18.0–26.0)
LACTATE: 0.7 mmol/L (ref 0.0–1.3)
LACTATE: 0.6 mmol/L (ref 0.0–1.3)
PAO2/FIO2 RATIO: 353 (ref <=200)
PAO2/FIO2 RATIO: 330 (ref <=200)
PCO2 (ARTERIAL): 35 mmHg (ref 35.0–45.0)
PCO2 (ARTERIAL): 39 mmHg (ref 35.0–45.0)
PH (ARTERIAL): 7.47 — ABNORMAL HIGH (ref 7.35–7.45)
PH (ARTERIAL): 7.43 (ref 7.35–7.45)
PH (ARTERIAL): 7.43 (ref 7.35–7.45)
PO2 (ARTERIAL): 106 mmHg — ABNORMAL HIGH (ref 72.0–100.0)
PO2 (ARTERIAL): 99 mmHg (ref 72.0–100.0)

## 2018-05-04 LAB — POC BLOOD GLUCOSE (RESULTS)
GLUCOSE, POC: 124 mg/dL — ABNORMAL HIGH (ref 70–105)
GLUCOSE, POC: 136 mg/dL — ABNORMAL HIGH (ref 70–105)
GLUCOSE, POC: 164 mg/dL — ABNORMAL HIGH (ref 70–105)

## 2018-05-04 LAB — ECG 12-LEAD
Atrial Rate: 119 {beats}/min
Calculated P Axis: 52 degrees
Calculated P Axis: 52 degrees
Calculated R Axis: 50 degrees
Calculated T Axis: -178 degrees
PR Interval: 132 ms
QRS Duration: 86 ms
QT Interval: 366 ms
QTC Calculation: 514 ms
Ventricular rate: 119 {beats}/min

## 2018-05-04 LAB — BASIC METABOLIC PANEL
ANION GAP: 6 mmol/L (ref 4–13)
BUN/CREA RATIO: 16 (ref 6–22)
BUN: 8 mg/dL (ref 8–25)
CALCIUM: 7.3 mg/dL — ABNORMAL LOW (ref 8.5–10.2)
CHLORIDE: 107 mmol/L (ref 96–111)
CO2 TOTAL: 24 mmol/L (ref 22–32)
CREATININE: 0.49 mg/dL — ABNORMAL LOW (ref 0.62–1.27)
ESTIMATED GFR: >60 mL/min/1.73mˆ2 (ref >60)
GLUCOSE: 115 mg/dL (ref 65–139)
POTASSIUM: 3.7 mmol/L (ref 3.5–5.1)
SODIUM: 137 mmol/L (ref 136–145)

## 2018-05-04 LAB — CBC
HCT: 24.8 % — ABNORMAL LOW (ref 38.9–52.0)
HGB: 7.4 g/dL — ABNORMAL LOW (ref 13.4–17.5)
MCH: 26 pg (ref 26.0–32.0)
MCHC: 29.8 g/dL — ABNORMAL LOW (ref 31.0–35.5)
MCV: 87 fL (ref 78.0–100.0)
MPV: 11.4 fL (ref 8.7–12.5)
PLATELETS: 146 x10ˆ3/uL — ABNORMAL LOW (ref 150–400)
RBC: 2.85 x10ˆ6/uL — ABNORMAL LOW (ref 4.50–6.10)
RDW-CV: 22.8 % — ABNORMAL HIGH (ref 11.5–15.5)
WBC: 5.7 x10?3/uL (ref 3.7–11.0)
WBC: 5.7 x10ˆ3/uL (ref 3.7–11.0)

## 2018-05-04 LAB — PHOSPHORUS: PHOSPHORUS: 2.9 mg/dL (ref 2.4–4.7)

## 2018-05-04 LAB — MAGNESIUM: MAGNESIUM: 1.9 mg/dL (ref 1.6–2.6)

## 2018-05-04 MED ORDER — ELECTROLYTE-A INTRAVENOUS SOLUTION
INTRAVENOUS | Status: DC
Start: 2018-05-04 — End: 2018-05-04

## 2018-05-04 MED ORDER — PROPRANOLOL 10 MG TABLET
10.0000 mg | ORAL_TABLET | Freq: Three times a day (TID) | ORAL | Status: DC
Start: 2018-05-04 — End: 2018-05-10
  Administered 2018-05-04 – 2018-05-10 (×18): 10 mg via GASTROSTOMY
  Filled 2018-05-04 (×20): qty 1

## 2018-05-04 MED ADMIN — ipratropium 0.5 mg-albuteroL 3 mg (2.5 mg base)/3 mL nebulization soln: RESPIRATORY_TRACT | @ 02:00:00

## 2018-05-04 MED ADMIN — dexMEDEtomidine 400 mcg/100 mL (4 mcg/mL) in 0.9 % sodium chloride IV: INTRAVENOUS | @ 20:00:00

## 2018-05-04 MED ADMIN — polyethylene glycol 3350 17 gram oral powder packet: GASTROSTOMY | @ 09:00:00

## 2018-05-04 MED ADMIN — ipratropium 0.5 mg-albuteroL 3 mg (2.5 mg base)/3 mL nebulization soln: RESPIRATORY_TRACT | @ 08:00:00

## 2018-05-04 MED ADMIN — chlorhexidine gluconate 0.12 % mouthwash: ORAL | @ 09:00:00

## 2018-05-04 MED ADMIN — oxyCODONE-acetaminophen 5 mg-325 mg tablet: SUBCUTANEOUS | @ 22:00:00

## 2018-05-04 MED ADMIN — sodium chloride 0.9 % (flush) injection syringe: GASTROSTOMY | @ 14:00:00

## 2018-05-04 MED ADMIN — sodium chloride 0.9 % (flush) injection syringe: @ 14:00:00

## 2018-05-04 MED ADMIN — sodium chloride 0.9 % (flush) injection syringe: GASTROSTOMY | @ 22:00:00

## 2018-05-04 MED ADMIN — sodium chloride 0.9 % (flush) injection syringe: INTRAVENOUS | @ 18:00:00

## 2018-05-04 MED ADMIN — lactated Ringers intravenous solution: SUBCUTANEOUS | @ 18:00:00

## 2018-05-04 MED ADMIN — salsalate 750 mg tablet: RESPIRATORY_TRACT | @ 14:00:00

## 2018-05-04 NOTE — Care Plan (Signed)
PT attempted to provide care to Antonio Gillespie today at 09:25. Care could not be delivered at that time due to patient not appropriate at this time per RN. RN performing ROM.

## 2018-05-04 NOTE — Nurses Notes (Signed)
Restraint Continuation      Patient continues to have the following condition AMS, trach, lines.       Least restrictive alternatives attempted : Increase patient observation, moved closer to the nurses station, bed-exit alarm/motion detector, concealed tubes/lines, decreased/removed stimulus, frequent orientation, involved patient in conversation, reoriented to person, place, time, and treatment, encouraged relaxation techniques, redirected behavior, used TV/music and assessed meds/medical problems    Patient continues to exhibit the following behaviors: agitated    The restraint continued to facilitate medical/surgical treatment to ensure safety.    The patient will continue to be evaluated and assessments documented on the flowsheet to ensure that the patient is released from the restraint at the earliest possible time

## 2018-05-04 NOTE — Care Plan (Signed)
OT attempted to provide care to Antonio Gillespie today at 09:30. Care could not be delivered at that time due to pt not appropriate to see for therapy this date.  Will follow when patient is more available and appropriate for OT evaluation.  Thank you for this referral,  McCausland Ropes MOT, OTR/L  Pager# [phone]

## 2018-05-04 NOTE — Respiratory Therapy (Signed)
VENTILATOR - CPAP(PS) / SPONTANEOUS CONTINUOUS Discontinue   Duration: Until Specified    Priority: Routine       Question Answer Comment   FIO2 (%) 30    Peep(cm/H2O) 8    Pressure Support(cm/H2O) 12    Indications IMPROVE DISTRIBUTION OF VENTILATION    Invasive/Non-Invasive INVASIVE         Pt currently resting on above vent settings, tolerating well. Attempted to wean pt's PS to 10 and PEEP to 5 but pt's tidal volumes decreased and he became tachypneic. No other issues at this time. Pt is currently receiving DuoNeb treatments Q6. Will continue to monitor the pt's respiratory status and wean as tolerated.

## 2018-05-04 NOTE — Care Management Notes (Addendum)
[facility]  Care Management Note    Patient Name: Antonio Gillespie  Date of Birth: [birth year]  Sex: male  Date/Time of Admission: 04/28/2018  3:32 PM  Room/Bed: 04/A  Payor: /    LOS: 6 days   Primary Care Providers:  Selinda Eon, MD, MD (General)    Admitting Diagnosis:  Scalp hematoma [S00.03XA]    Assessment:      05/04/18 1532   Assessment Details   Assessment Type Continued Assessment   Date of Care Management Update 05/04/18   Date of Next DCP Update 05/05/18   Care Management Plan   Discharge Planning Status plan in progress   Projected Discharge Date 05/11/18   CM will evaluate for rehabilitation potential yes   Discharge Needs Assessment   Discharge Facility/Level of Care Needs Undetermined at this time     Tracheostomy and peg placed.  Completed. Pt trach to vent. MRI of brain with nonspecific white matter changes. EEG showed no seizures.  Continue to attempt daily reduction of hernia.  Ortho following right humeral neck fracture- sling as needed.  ENT to consult for nasal fracture.   [employer] assisting with Medicaid application.                    Discharge Plan:  Undetermined at this time      The patient will continue to be evaluated for developing discharge needs.     Case Manager: Glean Salvo  Phone: [phone]

## 2018-05-04 NOTE — Care Plan (Signed)
Pt is currently on a ATC for the second time today at 40%.  Plan to rest tonight and restart ATC again tomorrow.

## 2018-05-04 NOTE — Progress Notes (Signed)
[facility]                                                      Trauma Progress Note                 Date of Birth:  1960-09-15  Date of Admission:  04/28/2018  Date of service: 05/04/2018    Antonio Gillespie, 58 y.o., male Post trauma day 6 status post Fall    Events over the last 24 hours have included:  Trach/PEG performed yesterday    Subjective:    Patient remains on ventilator. Localizing to pain this morning.     Objective   24 Hour Summary:    Filed Vitals:    05/04/18 0300 05/04/18 0306 05/04/18 0400 05/04/18 0500   BP: 93/60  101/64 103/81   Pulse: 100  95 99   Resp: (!) 11  (!) 10 12   Temp:   37.1 C (98.8 F)    SpO2: 97% 98% 99% 99%     Labs:  Recent Labs     05/03/18  0043 05/03/18  0444 05/03/18  1632 05/04/18  0102 05/04/18  0114   WBC 4.5  --  6.1 5.7  --    HGB 7.6*  --  8.0* 7.4*  --    HCT 24.9*  --  26.9* 24.8*  --    SODIUM 140  --  137 137  --    POTASSIUM 4.3  --  3.8 3.7  --    CHLORIDE 111  --  109 107  --    BICARBONATE  --  24.8 23.4  --  26.6*   BUN 9  --  9 8  --    CREATININE 0.53*  --  0.54* 0.49*  --    ANIONGAP 5  --  4 6  --    CALCIUM 7.3*  --  7.3* 7.3*  --    MAGNESIUM 2.0  --  2.1 1.9  --    PHOSPHORUS 2.9  --  3.3 2.9  --    INR  --   --  1.25*  --   --        Intake/Output Summary (Last 24 hours) at 05/04/2018 0615  Last data filed at 05/04/2018 0600  Gross per 24 hour   Intake 1976.4 ml   Output 2970 ml   Net -993.6 ml     Nutrition Management: MNT PROTOCOL FOR DIETITIAN  ADULT TUBE FEEDING - CONTINUOUS DRIP CONTINUOUS NO MEALS, TF ONLY; OSMOLITE 1.5; OG; Initial Rate (ml/hr): 20; Increase by: 10 ml every 6 hours; Goal Rate (ml/hr): 60  DIET NPO - NOW INCLUDING TUBE FEEDS Last Bowel Movement: 05/02/18  No results for input(s): ALBUMIN, PREALBUMIN in the last 72 hours.  Current Medications:    Current Facility-Administered Medications:   .  acetaminophen (TYLENOL) 160 mg per 5 mL liquid - grape flavored, 650 mg, Gastric (NG, OG, PEG, GT), Q4H PRN, Thimm, Terra, MD, 650  mg at 04/29/18 0316  .  aspirin chewable tablet 81 mg, 81 mg, Gastric (NG, OG, PEG, GT), Daily, McCoy, Skyler, MD, 81 mg at 05/03/18 0916  .  chlorhexidine gluconate (PERIDEX) 0.12% mouthwash, 15 mL, Swish & Spit, 2x/day, Kasandra Knudsen, Victorino Dike, MD, 15 mL at 05/03/18 2138  .  dexmedetomidine (PRECEDEX) 400 mcg in NS premix infusion, 0.3 mcg/kg/hr (Ideal), Intravenous, Continuous, McCoy, Skyler, MD, Last Rate: 3.4 mL/hr at 05/03/18 1924, 0.2 mcg/kg/hr at 05/03/18 1924  .  electrolyte-A (PLASMALYTE-A) premix infusion, , Intravenous, Continuous, Karma Greaser, MD, Last Rate: 100 mL/hr at 05/03/18 2139  .  enoxaparin PF (LOVENOX) 60 mg/0.6 mL SubQ injection, 0.5 mg/kg, Subcutaneous, Q12H, Babs Sciara, MD, 50 mg at 05/03/18 2140  .  famotidine (PEPCID) tablet, 20 mg, Gastric (NG, OG, PEG, GT), 2x/day, Kasandra Knudsen, Victorino Dike, MD, 20 mg at 05/03/18 2138  .  gabapentin (NEURONTIN) 300mg  per 24mL oral liquid, 300 mg, Gastric (NG, OG, PEG, GT), 3x/day, Bardes, Knute, MD, 300 mg at 05/03/18 2138  .  haloperidol (HALDOL) 5 mg/mL injection, 5 mg, Intravenous, Q6H PRN, Carlynn Herald, MD, 5 mg at 05/03/18 0042  .  HYDROmorphone (DILAUDID) 2 mg/mL injection, 0.2 mg, Intravenous, Q4H PRN **OR** HYDROmorphone (DILAUDID) 2 mg/mL injection, 0.4 mg, Intravenous, Q4H PRN, Carlynn Herald, MD, 0.4 mg at 05/03/18 1137  .  ipratropium-albuterol 0.5 mg-3 mg(2.5 mg base)/3 mL Solution for Nebulization, 3 mL, Nebulization, Q6H, McCoy, Skyler, MD, 3 mL at 05/04/18 0134  .  lanolin-oxyquin-pet, hydrophil (BAG BALM) topical ointment, , Apply Topically, 2x/day PRN, Michaelle Birks, MD  .  [COMPLETED] LORazepam (ATIVAN) 2 mg/mL injection, 2 mg, Intravenous, Q8H, 2 mg at 05/02/18 1414 **FOLLOWED BY** [COMPLETED] LORazepam (ATIVAN) tablet, 1 mg, Gastric (NG, OG, PEG, GT), Q8H, 1 mg at 05/03/18 1334 **FOLLOWED BY** LORazepam (ATIVAN) tablet, 1 mg, Gastric (NG, OG, PEG, GT), Q12H, 1 mg at 05/03/18 2139 **FOLLOWED BY** LORazepam (ATIVAN)  tablet, 1 mg, Gastric (NG, OG, PEG, GT), NIGHTLY, Babs Sciara, MD  .  INSERT & MAINTAIN PERIPHERAL IV ACCESS, , , UNTIL DISCONTINUED **AND** DRESSING CHANGE Saline Lock; DRY STERILE, , , PRN **AND** NS flush syringe, 2 mL, Intracatheter, Q8HRS, Stopped at 05/03/18 2200 **AND** NS flush syringe, 2-6 mL, Intracatheter, Q1 MIN PRN, Dory Peru, Hansol, MD  .  nutrition protein supplement 15 g per 30 mL packet, 2 Packet, Gastric (NG, OG, PEG, GT), Daily, Michaelle Birks, MD, 30 g at 05/03/18 0916  .  polyethylene glycol (MIRALAX) oral packet, 17 g, Gastric (NG, OG, PEG, GT), Daily, Karma Greaser, MD, 17 g at 05/03/18 0916  .  prenatal vitamin-iron-folic acid tablet, 1 Tab, Gastric (NG, OG, PEG, GT), Daily, Babs Sciara, MD, 1 Tab at 05/03/18 [phone]  .  senna concentrate (SENNA) 528mg  per 44mL oral liquid, 5 mL, Gastric (NG, OG, PEG, GT), 2x/day, Kasandra Knudsen, Victorino Dike, MD, 176 mg at 05/03/18 2138  .  SSIP insulin R human (HUMULIN R) 100 units/mL injection, 0-12 Units, Subcutaneous, Q4H PRN, 2 Units at 05/03/18 1200 **AND** POCT WHOLE BLOOD GLUCOSE, , , Q6H, Carlynn Herald, MD  .  thiamine-vitamin B1 (BETAXIN) tablet, 100 mg, Gastric (NG, OG, PEG, GT), Daily, Babs Sciara, MD, 100 mg at 05/03/18 0917     Today's Physical Exam:  GEN:   Chronically ill appearing  HEENT:   Pupils equal but not reactive.  PULM:   Mechanically ventilated  CV:   Regular rate  ABD:   Soft, nondistended. Left inguinal hernia extending into scrotum, reducible, less prominent than previously.   NEURO:   GCS 1-4-1T, localizing with left upper extremity.   Integumentary:  Feet and knees with scattered wounds, dry in nature, no surrounding erythema, dressings in place    Assessment/ Plan:   Active Hospital Problems   (*Primary Problem)    Diagnosis   . *Fall     "  found down" and taken to outside hospital     . COPD (chronic obstructive pulmonary disease) (CMS HCC)     Chronic   . Thrombocytopenia Unspecified   . Hypokalemia   . Alcohol abuse   .  Troponin I above reference range   . Acute respiratory failure (CMS HCC)   . Nasal fracture     Acute vs chronic     . Abrasions of multiple sites   . Scalp hematoma   . Humeral fracture     nonop per ortho     . UTI (urinary tract infection)     At outside hospital. Repeat UA pending     . Left inguinal hernia     S/p reduction     . Anasarca     DVT prophylaxis:  Enoxaparin and SCDs/ Venodynes/Impulse boots  Anticoagulants (last 24 hours)     Date/Time Action Medication Dose    04/29/18 0827 Given    enoxaparin PF (LOVENOX) 40 mg/0.4 mL SubQ injection 40 mg    04/28/18 2020 Given    enoxaparin PF (LOVENOX) 40 mg/0.4 mL SubQ injection 40 mg        Nutrition: MNT PROTOCOL FOR DIETITIAN  ADULT TUBE FEEDING - CONTINUOUS DRIP CONTINUOUS NO MEALS, TF ONLY; OSMOLITE 1.5; OG; Initial Rate (ml/hr): 20; Increase by: 10 ml every 6 hours; Goal Rate (ml/hr): 60  DIET NPO - NOW INCLUDING TUBE FEEDS diet, Last Bowel Movement: 05/02/18  Activity: No limitations    Pain:   Analgesics (last 24 hours)     Date/Time Action Medication Dose    04/29/18 0316 Given    acetaminophen (TYLENOL) 160 mg per 5 mL liquid - grape flavored 650 mg    04/28/18 1720 Given    fentaNYL (SUBLIMAZE) 50 mcg/mL injection 100 mcg    04/28/18 1648 Given    fentaNYL (SUBLIMAZE) 50 mcg/mL injection 50 mcg    04/28/18 1633 Given    fentaNYL (SUBLIMAZE) 50 mcg/mL injection 100 mcg        PT Recommendations:  Pending    Plan:     Poor neurological exam   - EEG - no seizures   - NCCU consult   - CTA intra/extracranial - no significant findings   - MRI with nonspecific white matter changes   - MRI C-spine negative, collar removed    Left inguinal hernia   - Reducible at bedside   - Continue to monitor for overlying skin changes   - Will attempt daily reduction    Right humeral neck fracture   - Ortho consulted   - No operative intervention at this time   - Sling as needed    Nasal bone fracture   - ENT consult   - Will re-eval when more awake    Bilateral lower  extremity wounds   - ABI/PVR obtained, normal   - Wound care consult    Continue tube feeds  Wean ventilator as able  Continue DVT prophylaxis  Continue SICU care    Gerhard Munch, MD  05/04/2018, 11:36    Trauma/Surgical Critical Care/Acute Care Surgery Staff    I have seen and examined the patient. I have reviewed the resident note.  I agree with the findings and plan of care as documented on 05/04/2018 including history, review of systems, examination, and findings with exceptions/additions as noted/edited.    Electronically Signed by:    Warren Lacy MD, MS  Assistant Professor of Surgery  Trauma, Surgical Critical Care,  and Acute Care Surgery  [facility]  05/04/2018 12:40

## 2018-05-04 NOTE — Care Plan (Signed)
Patient did not look to be in pain today. Restless intermittently. Precedex gtt continued. Ativan taper. Restraints continued to facilitate safety. GCS remains 3,5,1. Q2 hr turn with preventative mepilexes in accordance with PUP bundle. Wounds cared for per orders. Bathed. Oral care Q4 and PRN. Trach care Q4 and PRN. Inner cannula to be changed this evening. ATC for 2 hrs and then became tachypneic/tachycardic and utilizing accessory muscles --> placed back on vent. Pulmonary toileting. Maxi move. Tube feeding restarted..tolerating well. Q6 FS. No trending labs. Family calls in and updated by nursing staff. Will continue to closely monitor.   Problem: Adult Inpatient Plan of Care  Goal: Plan of Care Review  Outcome: Ongoing (see interventions/notes)  Flowsheets  Taken 05/03/2018 1952  Plan of Care Reviewed With:   patient   family  Taken 05/04/2018 2009  Progress: no change  Goal: Patient-Specific Goal (Individualized)  Outcome: Ongoing (see interventions/notes)  Flowsheets (Taken 05/03/2018 1952)  Individualized Care Needs: Q2 hr turn and skin injury prevention  Anxieties, Fears or Concerns: "do you think he will wake up more"- per patient's sister  Patient-Specific Goals (Include Timeframe): "for mental status to improve" patient's sister  Goal: Absence of Hospital-Acquired Illness or Injury  Outcome: Ongoing (see interventions/notes)  Goal: Optimal Comfort and Wellbeing  Outcome: Ongoing (see interventions/notes)     Problem: Communication Impairment (Mechanical Ventilation, Invasive)  Goal: Effective Communication  Outcome: Ongoing (see interventions/notes)     Problem: Device-Related Complication Risk (Mechanical Ventilation, Invasive)  Goal: Optimal Device Function  Outcome: Ongoing (see interventions/notes)     Problem: Inability to Wean (Mechanical Ventilation, Invasive)  Goal: Mechanical Ventilation Liberation  Outcome: Ongoing (see interventions/notes)     Problem: Nutrition Impairment (Mechanical  Ventilation, Invasive)  Goal: Optimal Nutrition Delivery  Outcome: Ongoing (see interventions/notes)     Problem: Skin and Tissue Injury (Mechanical Ventilation, Invasive)  Goal: Absence of Device-Related Skin and Tissue Injury  Outcome: Ongoing (see interventions/notes)     Problem: Ventilator-Induced Lung Injury (Mechanical Ventilation, Invasive)  Goal: Absence of Ventilator-Induced Lung Injury  Outcome: Ongoing (see interventions/notes)     Problem: Fall Injury Risk  Goal: Absence of Fall and Fall-Related Injury  Outcome: Ongoing (see interventions/notes)     Problem: Non-violent/Non-Self Destructive Restraints  Goal: Alternative methods tried prior to restraints  Outcome: Ongoing (see interventions/notes)  Goal: Patient free from injury and discomfort  Outcome: Ongoing (see interventions/notes)  Goal: Autonomy maintained at the highest possible level  Outcome: Ongoing (see interventions/notes)  Goal: Need for restraints reassessed per policy  Outcome: Ongoing (see interventions/notes)  Goal: Patient education provided  Outcome: Ongoing (see interventions/notes)  Goal: Problem Interventions  Outcome: Ongoing (see interventions/notes)     Problem: Skin Injury Risk Increased  Goal: Skin Health and Integrity  Outcome: Ongoing (see interventions/notes)

## 2018-05-04 NOTE — Progress Notes (Signed)
[facility]                                               SICU PROGRESS NOTE    Patient Name: Antonio Gillespie, Antonio Gillespie  ICU Day: 5  Date of Service: 05/04/2018  MRN: [id number]  BRIEF HPI:  Antonio Gillespie is a 58 y.o. male with PMH of atrial fibrillation, COPD, EtOH abuse who was transferred from outside facility. Per chart review, family of patient had not heard from him and called the police who found him in his home lying face down and covered in urine/feces. Patient was intubated at outside facility ED. Pan CT scan revealed a comminuted right humeral neck fracture, pulmonary artery enlargement, large left inguinal hernia. CT head showed bilateral scalp hematomas. CT c-spine negative. XR right hand and forearm were negative for fractures.       SUBJECTIVE INFORMATION  Overnight Events: No ATC remained hemodynamically stable   Response to treatment in last 24 hours: Clinically improving slowly  Nursing Concerns: None at this time   Family Concerns: No family at bedside this morning     OBJECTIVE INFORMATION  Vital Signs/Hemodynamics:  Heart Rate: 99  BP (Non-Invasive): 103/81  MAP (Non-Invasive): 88 mmHG  Respiratory Rate: 12  SpO2: 99 %  Temperature: 37.1 C (98.8 F)  Flowtrac/Hemodynamics:   ART line, non-invasive blood pressure cuff   Neuro:  GCS- 10 best    Today's Physical Exam:  GEN:   chronically ill, frail male appears older than stated age   45:   Pupils 64m bilaterally,large posterior scalp hematoma  NECK:   Trachea midline, cervical collar in place, intubated   PULM:   Diminished breath sounds bilaterally, course, Tracheostomy in place   CARDIAC: Afib   CHEST::No abrasions or contusions  ABD:   Abdomen soft, and nondistended  PELVIS: No evidence of injury  GU/RECTAL: large scrotal edema resolving   BACK:  No evidence of injury and no step-off  MS: ulcers noted on bilateral knees and feet  NEURO:  Intubated, following commands  GLASGOW COMA SCALE: Eye opening: 4  spontaneous, Verbal response:  Trachostomy , Best motor response:  6 obeys commands  VASCULAR:  All pulses palpable and equal bilaterally  INTEGUMENTARY:  Pink, warm, and dry and anasarca    Laboratory Data: Pertinent labs available at the time of patient evaluation were reviewed, values can be reviewed in the results section of the EMR.  CBC Results Differential Results   No results found for this or any previous visit (from the past 30 hour(s)). No results found for this or any previous visit (from the past 30 hour(s)).   BMP Results Other Chemistries Results   Results for orders placed or performed during the hospital encounter of 04/28/18 (from the past 30 hour(s))   BASIC METABOLIC PANEL    Collection Time: 05/04/18  1:02 AM   Result Value    SODIUM 137    POTASSIUM 3.7    CHLORIDE 107    CO2 TOTAL 24    GLUCOSE 115    BUN 8    CREATININE 0.49 (L)    Recent Results (from the past 30 hour(s))   PHOSPHORUS    Collection Time: 05/04/18  1:02 AM   Result Value    PHOSPHORUS 2.9   MAGNESIUM    Collection Time: 05/04/18  1:02 AM  Result Value    MAGNESIUM 1.9      Liver/Pancreas Enzyme Results Liver Function Results   No results found for this or any previous visit (from the past 30 hour(s)). No results found for this or any previous visit (from the past 30 hour(s)).   Cardiac Results Coags Results   No results found for this or any previous visit (from the past 30 hour(s)). Recent Results (from the past 30 hour(s))   PTT (PARTIAL THROMBOPLASTIN TIME)    Collection Time: 05/03/18  4:32 PM   Result Value    APTT 32.2   PT/INR    Collection Time: 05/03/18  4:32 PM   Result Value    PROTHROMBIN TIME 14.5 (H)    INR 1.25 (H)          Radiographic Studies: Pertinent radiology result available at the time of patient evaluation were reviewed, reports can be reviewed in the results section of the EMR.    CXR- Slight improvement since previous study      [facility] Problems     Diagnosis   . Primary Problem: Fall   . COPD (chronic obstructive pulmonary disease) (CMS HCC)   . Thrombocytopenia Unspecified   . Hypokalemia   . Alcohol abuse   . Troponin I above reference range   . Acute respiratory failure (CMS HCC)   . Nasal fracture   . Abrasions of multiple sites   . Scalp hematoma   . Humeral fracture   . UTI (urinary tract infection)   . Left inguinal hernia   . Anasarca     Antonio Gillespie is a 58 y.o. male with PMH of atrial fibrillation, COPD, EtOH abuse who was transferred from outside facility. Per chart review, family of patient had not heard from him and called the police who found him in his home lying face down and covered in urine/feces.Patient was intubated at outside facility ED. Pan CT scan revealed a comminuted right humeral neck fracture, pulmonary artery enlargement, large left inguinal hernia. CT head showed bilateral scalp hematomas. CT c-spine negative. XR right hand and forearm were negative for fractures.            Neurologic  Pain: Pain Score (Numeric, Faces): Other  . CPOT Score: 0   . Agitation (RASS): RASS ([place] Agitation-Sedation Scale): -1-->drowsy   Delirium (CAM-ICU): CAM-ICU Assessment: Proceed with CAM  Feature 1: Acute Onset or Fluctuating Course: Positive  Feature 2: Inattention: Positive  Feature 3: Altered Level of Consciousness: Positive  Feature 4: Disorganized Thinking: Positive  . Overall CAM-ICU (Delirium Present): Positive  . Analgesia: Tylenol   . Sedation: Precedex 0.2  . Chemical Paralysis: None  . Delirium: Unable to assess   . Substance Use Replacement: Lorazepam  . Seizure Prophylaxis: Not Indicated  . Neurologic Diagnoses: Encephalopathic   . Spontaneous Awakening Trial: Failed  . Neurologic Plan of Care: Maintain sleep/awake cycle, promote sleep hygiene  . NCCU- consult orders placed recs appreciated    . ENT- consult placed recs appreciated    CTA Imaging demonstrated mild stenosis at origin of right vertebral artery and  atherosclerosis without  evidence of traumatic injury   MRI brain showed no actue infarcts or intracranial mass. 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 Pulmonary Diagnoses: Acute Respiratory Failure, Acute Respiratory Insufficiency, Airway Compromise and COPD  . Mechanical Ventilation - Yes Mode CPAP, FiO2 30%, PEEP 5, PS 8, I:E ratio 1:1.5  . Airway - Tracheostomy   . Non-Invasive Ventilation - No  . Arterial Blood gas interpretation:PH 7.47, PCO2 35, PO2 106, HCO3 26.6, P/F ratio 353, Lactate 0.7  . Spontaneous Breathing Trial:Not Appropriate - Exclusion  . Pulmonary Plan of Care: Continue Aggressive pulmonary toilet   . Duo nebs- ordered   . Daily CXR  Cardiovascular  . Cardiovascular Diagnoses: History Afib   . Vasoactive Agents: None at this time   . Hemodynamic Monitoring:  ART line, non invasive blood pressure cuff   . Telemetry: Sinus Tachycardia  . Antiarrhythmics: None   . Blood Pressure Management: SBP goal less than 160  . Cardiovascular Plan of Care: Will continue to monitor and treat   Filed Vitals:    05/04/18 0400 05/04/18 0500 05/04/18 0600 05/04/18 0729   BP: 101/64 103/81 98/69    Pulse: 95 99 98    Resp: (!) _0 Temp: 37.1 C (98.8 F)      SpO2: 99% 99%  99%   .   [name]   Renal       Intake/Output Summary (Last 24 hours) at 05/04/2018 0620  Last data filed at 05/04/2018 0600  Gross per 24 hour   Intake 1976.4 ml   Output 2970 ml   Net -993.6 ml         . Fluid Balance: Negative 1.6L yesterday, net even since midnight   . urine output- 3.2L yesterday, 1 L since midnight   . Renal Diagnoses: None at this time   . Electrolyte Disorders: No gross abnormalities   . Renal Replacement Therapy:  No  . Diuresis:No  . IV Fluids: Plasmalyte (Rate: 25 cc/hr)  . Renal Plan of Care: Will continue to monitor   Gastrointestinal  . Diet/Nutrition: Continuous Tube Feeds 60 ml/hr goal, osmolite, restarted this morning   . Bowel Movement: Last Bowel Movement: 05/02/18  . Nutrition Access: PEG in place   . Bowel Regimen: Colace and Senokot  . GI Prophylaxis: H2 blocker  . GI Plan of Care: Will continue to monitor and treat as appropriate   Hematologic  . Anemia: Yes Acute Blood Loss H&H 7.4/24.8  . Transfusion Criteria: Hemoglobin less than 7  . Thrombocytopenia - No 146  . DVT Prophylaxis: Enoxaparin and SCDs/ Venodynes/Impulse boots  . Heme Plan of Care: Will continue to monitor and treat as appropriate   Immune/ID   WBC-5.7, afebrile   Blood cultures: NGTD   Urine cultures:  NGTD   BAL: Staphylococcus species, MRSA negative   OR cultures:  none   ABX: Nitrofurantoin for UTI (stop date 05/01/2018)  . ID Plan of Care: Will continue to monitor for infectious process   Endocrine  . Steroid Use:No  . Insulin use: Sliding Scale blood sugar  108-165  . Endocrine Plan of Care: Will continue to monitor   Musculoskeletal  . Weight Bearing: partial weight bearing  . Mobility/Activity: OOB as tolerated   . Head of Bed Elevated: 30 degrees   . MSK Plan of Care: Will need aggressive PT/OT for physical deconditioning   Integumentary  . Skin Breakdown: NONE except for surgical site  . Skin Prophylaxis: Mepilex and  Turn every 2 hours  . Skin/Wound Plan of Care: Will continue to promote healthy skin integrity     Patient Lines/Drains/Airways Status    Active Line / Dialysis Catheter / Dialysis Graft / Drain / Airway / Wound     Name: Placement date: Placement time: Site: Days:    Peripheral IV Ultrasound guided;Extended dwell catheter Right Forearm  05/01/18   1100   2    Peripheral IV Ultrasound guided;Extended dwell catheter Left;Lower Forearm  05/01/18   1400   2    Peripheral IV Extended dwell catheter;Ultrasound guided Left  Forearm  05/01/18   1400   2    Arterial Line Right Radial  05/01/18   1425   Radial  2    Gastrostomy Tube Left  05/03/18   --   1    Foley Catheter  04/28/18   1615   5    Tracheostomy 8 Cuffed  05/03/18   --   1    Wound (Non-Surgical) Left Chest  04/28/18   1600   5    Wound (Non-Surgical) Left;Right Toe  04/28/18   1600   5    Wound (Non-Surgical) Left Mouth  04/28/18   1600   5    Wound (Non-Surgical) Left;Right Knee  04/28/18   1600   5    Surgical Incision Bilateral Neck  05/03/18   1525   less than 1                Patient has decision making capacity: no  Family Updated today: no  Advance Directive: Medical Power of [place], Health Care Surrogate  Code Status Information     Code Status    Full Code          Co-Managing Services: Trauma, Orthopeadics, ENT     Samule Ohm, PA-C 05/04/2018, 06:20    ______________________________________________________________________________  SICU Attending    I personally saw and evaluated the patient. See mid-level's note for additional details. My findings/participation are:    Acute respiratory failure:  Was tachypneic and had large amount of secretions overnight.  Will attempt to wean to aerosol trach collar today.  He is currently also diuresing.  We can DC his IV fluids.  Monitor his fluid balance but expecting to be was 1 L negative today.  Still having some agitation tachycardia.  Remains on Ativan taper for withdrawal.  Will add propranolol 10 mg t.i.d. Today.  Advanced tube feeds to goal.  Wound care seeing him for a lip wound.    Critical Care Attestation    I was present at the bedside of this critically ill patient for 35 minutes exclusive of procedures.  This patient suffers from failure or dysfunction of Neurologic/Sensory, Cardiovascular, Pulmonary and GI/Hepatopancreaticobiliary system(s).  The care of this patient was in regard to managing (a) conditions(s) that has a high probability of sudden, clinically significant, or life-threatening deterioration  and required a high degree of Attending Physician attention and direct involvement to intervene urgently. Data review and care planning was performed in direct proximity of the patient, examination was obviously performed in direct contact with the patient. All of this time was exclusive of procedure which will be documented elsewhere in the chart.    My critical care time is independent and unique to other providers (no other providers saw patient for purposes of sicu evaluation)  My critical care time involved full attention to the patients' condition and included:    Review of nursing notes  and/or old charts  Review of medications, allergies, and vital signs  Documentation time  Consultant collaboration on findings and treatment options  Care, transfer of care, and discharge plans  Ordering, interpreting, and reviewing diagnostic studies/tab tests  Obtaining necessary history from family, EMS, nursing staff and/or treating physicians    My critical care time did not include time spent teaching resident physician(s) or other services of resident physicians, or performing other reported procedures.  Total Critical Care Time: 35 minutes    Ann Held, MD

## 2018-05-04 NOTE — Nurses Notes (Signed)
Restraint Continuation      Patient continues to have the following condition: restlessness.       Least restrictive alternatives attempted : Increase patient observation, concealed tubes/lines, decreased/removed stimulus, frequent orientation, reoriented to person, place, time, and treatment, encouraged relaxation techniques, redirected behavior and assessed meds/medical problems    Patient continues to exhibit the following behaviors: uncooperative, agitated and withdrawn    The restraint continued to facilitate medical/surgical treatment to ensure safety.    The patient will continue to be evaluated and assessments documented on the flowsheet to ensure that the patient is released from the restraint at the earliest possible time

## 2018-05-04 NOTE — Nurses Notes (Signed)
05/04/18 0800   Restraint Order (Document with every START action and daily when new order obtained)   Order Obtained Yes   Length of Order 24   Restraint Type(Q2 Hours)   Restraint Used M   Soft Restraint Right Wrist CONTINUED   Soft Restraint Left Wrist CONTINUED   Pt Assessment (Q2 Hours)   Reason For Restraint Limit ability/pt attempting to remove artificial airway;Limit ability/pt attempting to pull IV lines;Limit ability/pt attempting to pull tubes;Intermittent break through RASS>-2   Pt's current behavior/status Restless;Agitated   Emotional Support Provided   Restraint Monitoring (Q2 Hours)   Assessed For Continued Need Yes   Visual Check AG   Circulation   (blisters noted to left wrist - mepilex dressing applied)   Restraint Removed for Assessment Yes   Range of Motion P   Fluids N   Food/Meal/Bottle N   Elimination UC   Education Group (Q2 Hours)   Discontinuation Criteria Able to follow directions;Resolution of contributing medical condition;Discontinuation of medical treatment;Decreased agitation;Oriented to self and environment   Criteria Explained YES   Patient/Co-Learner's Response NL   Restraint Plan of Care   Restraint Plan of Care Updated   .Marland KitchenRestraint Continuation      Patient continues to have an altered mental status as evidenced by attempts to pull at lines/drains.       Least restrictive alternatives attempted : Increase patient observation, bed-exit alarm/motion detector, concealed tubes/lines, decreased/removed stimulus, frequent orientation, involved patient in conversation, provided familiar/personal items, reoriented to person, place, time, and treatment and assessed meds/medical problems    Patient continues to exhibit the following behaviors: agitated intermittently.     The restraint continued to facilitate medical/surgical treatment to ensure safety.    The patient will continue to be evaluated and assessments documented on the flowsheet to ensure that the patient is released from  the restraint at the earliest possible time

## 2018-05-04 NOTE — Anesthesia Postprocedure Evaluation (Signed)
Anesthesia Post Op Evaluation    Patient: Antonio Gillespie  Procedure(s):  TRACHEOSTOMY  GASTROSCOPY WITH PLACEMENT PEG TUBE    Last Vitals:Temperature: 37.1 C (98.8 F) (05/04/18 0800)  Heart Rate: 98 (05/04/18 0600)  BP (Non-Invasive): 98/69 (05/04/18 0600)  Respiratory Rate: 15 (05/04/18 0600)  SpO2: 97 % (05/04/18 0903)      Patient location during evaluation: bedside       Patient participation: waiting for patient participation  Level of consciousness: sedated  Pain management: adequate  Airway patency: patent  Anesthetic complications: no  Cardiovascular status: acceptable  Respiratory status: acceptable  Hydration status: acceptable    PONV Status: Absent

## 2018-05-05 ENCOUNTER — Inpatient Hospital Stay (HOSPITAL_COMMUNITY): Payer: Medicaid Other

## 2018-05-05 ENCOUNTER — Inpatient Hospital Stay (HOSPITAL_COMMUNITY): Payer: Medicaid Other | Admitting: Radiology

## 2018-05-05 DIAGNOSIS — I517 Cardiomegaly: Secondary | ICD-10-CM

## 2018-05-05 DIAGNOSIS — Z930 Tracheostomy status: Secondary | ICD-10-CM

## 2018-05-05 DIAGNOSIS — R509 Fever, unspecified: Secondary | ICD-10-CM

## 2018-05-05 LAB — POC BLOOD GLUCOSE (RESULTS)
GLUCOSE, POC: 172 mg/dL — ABNORMAL HIGH (ref 70–105)
GLUCOSE, POC: 200 mg/dL — ABNORMAL HIGH (ref 70–105)
GLUCOSE, POC: 138 mg/dL — ABNORMAL HIGH (ref 70–105)

## 2018-05-05 LAB — ARTERIAL BLOOD GAS/LACTATE
%FIO2 (ARTERIAL): 30 %
BASE EXCESS (ARTERIAL): 4.2 mmol/L — ABNORMAL HIGH (ref 0.0–1.0)
BICARBONATE (ARTERIAL): 28.2 mmol/L — ABNORMAL HIGH (ref 18.0–26.0)
BICARBONATE (ARTERIAL): 28.2 mmol/L — ABNORMAL HIGH (ref 18.0–26.0)
LACTATE: 0.6 mmol/L (ref 0.0–1.3)
PAO2/FIO2 RATIO: 337 (ref <=200)
PCO2 (ARTERIAL): 40 mmHg (ref 35.0–45.0)
PH (ARTERIAL): 7.46 — ABNORMAL HIGH (ref 7.35–7.45)
PO2 (ARTERIAL): 101 mmHg — ABNORMAL HIGH (ref 72.0–100.0)

## 2018-05-05 LAB — BASIC METABOLIC PANEL
ANION GAP: 4 mmol/L (ref 4–13)
BUN/CREA RATIO: 17 (ref 6–22)
BUN: 8 mg/dL (ref 8–25)
CALCIUM: 7.4 mg/dL — ABNORMAL LOW (ref 8.5–10.2)
CHLORIDE: 105 mmol/L (ref 96–111)
CO2 TOTAL: 27 mmol/L (ref 22–32)
CREATININE: 0.47 mg/dL — ABNORMAL LOW (ref 0.62–1.27)
ESTIMATED GFR: >60 mL/min/1.73mˆ2 (ref >60)
GLUCOSE: 161 mg/dL — ABNORMAL HIGH (ref 65–139)
POTASSIUM: 3.4 mmol/L — ABNORMAL LOW (ref 3.5–5.1)
SODIUM: 136 mmol/L (ref 136–145)

## 2018-05-05 LAB — CBC
HCT: 24.4 % — ABNORMAL LOW (ref 38.9–52.0)
HGB: 7.3 g/dL — ABNORMAL LOW (ref 13.4–17.5)
MCH: 26.2 pg (ref 26.0–32.0)
MCHC: 29.9 g/dL — ABNORMAL LOW (ref 31.0–35.5)
MCV: 87.5 fL (ref 78.0–100.0)
MPV: 11.9 fL (ref 8.7–12.5)
PLATELETS: 172 10*3/uL (ref 150–400)
PLATELETS: 172 x10ˆ3/uL (ref 150–400)
RBC: 2.79 x10ˆ6/uL — ABNORMAL LOW (ref 4.50–6.10)
RDW-CV: 22.1 % — ABNORMAL HIGH (ref 11.5–15.5)
WBC: 5.8 x10ˆ3/uL (ref 3.7–11.0)

## 2018-05-05 LAB — MAGNESIUM: MAGNESIUM: 2 mg/dL (ref 1.6–2.6)

## 2018-05-05 LAB — PHOSPHORUS: PHOSPHORUS: 2.5 mg/dL (ref 2.4–4.7)

## 2018-05-05 MED ORDER — THIAMINE HCL (VITAMIN B1) 100 MG TABLET
100.0000 mg | ORAL_TABLET | Freq: Every day | ORAL | Status: DC
Start: 2018-05-05 — End: 2018-05-05

## 2018-05-05 MED ORDER — THIAMINE HCL (VITAMIN B1) 100 MG TABLET
100.0000 mg | ORAL_TABLET | Freq: Every day | ORAL | Status: DC
Start: 2018-05-06 — End: 2018-05-05

## 2018-05-05 MED ADMIN — sodium chloride 0.9 % (flush) injection syringe: @ 22:00:00

## 2018-05-05 MED ADMIN — famotidine 20 mg tablet: GASTROSTOMY | @ 22:00:00

## 2018-05-05 MED ADMIN — amino ac-protein hydro-whey protein 10 gram-100 kcal/30 mL oral liquid: GASTROSTOMY | @ 09:00:00

## 2018-05-05 MED ADMIN — propranoloL 10 mg tablet: GASTROSTOMY | @ 22:00:00

## 2018-05-05 MED ADMIN — gabapentin 250 mg/5 mL oral solution: GASTROSTOMY | @ 09:00:00

## 2018-05-05 MED ADMIN — PREMIXED HEMODIALYSATE W/ ADDITIVES: GASTROSTOMY | @ 22:00:00

## 2018-05-05 MED ADMIN — propranoloL 10 mg tablet: GASTROSTOMY | @ 16:00:00

## 2018-05-05 MED ADMIN — insulin regular human 100 unit/mL injection solution: SUBCUTANEOUS | @ 02:00:00

## 2018-05-05 MED ADMIN — sodium chloride 0.9 % (flush) injection syringe: GASTROSTOMY | @ 22:00:00

## 2018-05-05 MED ADMIN — VANCOMYCIN IV - PHARMACIST TO DOSE PER PROTOCOL: GASTROSTOMY | @ 09:00:00

## 2018-05-05 MED ADMIN — sodium chloride 0.9 % (flush) injection syringe: SUBCUTANEOUS | @ 22:00:00

## 2018-05-05 MED ADMIN — acetaminophen 325 mg tablet: GASTROSTOMY | @ 21:00:00

## 2018-05-05 NOTE — Nurses Notes (Signed)
Today Pt weaned to ATC for 2 hrs, and became tachypneic but otherwise tolerated well. Precedex turned off this morning and by the evening he is able to follow commands with LLE (wiggles toes), sticks out his tounge and opens eyes to voice. Janina Mayo has moderate tan thick secretions. Skin beneath is angry red and tender to touch. Inner cannula changed, trach ties and split gauze used to keep area dry underneath. Mepitels intact per wound care instructions.

## 2018-05-05 NOTE — Care Plan (Signed)
PT attempted to provide care to Antonio Gillespie today at 08:20. Care could not be delivered at that time due to patient was not appropriate at this time per RN.

## 2018-05-05 NOTE — Ancillary Notes (Signed)
SBIRT    SBIRT not completed at this time due to patient's medical status -trached/vent and continued need for restraints.  Pending recommendations:  Attempt to complete SBIRT at later time/date.    Wyn Forster, LGSW Clinical Therapist 05/05/2018, 08:05  Pager  [phone]

## 2018-05-05 NOTE — Nurses Notes (Signed)
05/05/18 2000 05/05/18 2001   Vital Signs   Temperature (!) 38.6 C (101.5 F)  (RN NOTIFIED) (!) 39.3 C (102.7 F)  (RN NOTIFIED)   Temp Source Axillary Oral     SICU notified of pt's increased temp. Orders given for cooling blanket, cultures, UA, BAL, and chest xray. PRN tylenol also given. Will continue to monitor.

## 2018-05-05 NOTE — Progress Notes (Signed)
[facility]                                               SICU PROGRESS NOTE    Patient Name: Antonio Gillespie, Antonio Gillespie  ICU Day: 6  Date of Service: 05/05/2018  MRN: [id number]  BRIEF HPI:  Antonio Gillespie is a 58 y.o. male with PMH of atrial fibrillation, COPD, EtOH abuse who was transferred from outside facility. Per chart review, family of patient had not heard from him and called the police who found him in his home lying face down and covered in urine/feces. Patient was intubated at outside facility ED. Pan CT scan revealed a comminuted right humeral neck fracture, pulmonary artery enlargement, large left inguinal hernia. CT head showed bilateral scalp hematomas. CT c-spine negative. XR right hand and forearm were negative for fractures.       SUBJECTIVE INFORMATION  Overnight Events: No acute events    Response to treatment in last 24 hours: Clinically improving slowly, ATC 40 minutes   Nursing Concerns: None at this time   Family Concerns: No family at bedside this morning     OBJECTIVE INFORMATION  Vital Signs/Hemodynamics:  Heart Rate: 90  BP (Non-Invasive): 117/71  MAP (Non-Invasive): 85 mmHG  Respiratory Rate: 19  SpO2: 98 %  Temperature: 36.9 C (98.4 F)  Flowtrac/Hemodynamics:   ART line, non-invasive blood pressure cuff   Neuro:  GCS- 10 best    Today's Physical Exam:  GEN:   chronically ill, frail male appears older than stated age   29:   Pupils 16m bilaterally,large posterior scalp hematoma  NECK:   Trachea midline, cervical collar in place, intubated   PULM:   Diminished breath sounds bilaterally, course, Tracheostomy in place   CARDIAC: Afib   CHEST::No abrasions or contusions  ABD:   Abdomen soft, and nondistended  PELVIS: No evidence of injury  GU/RECTAL: large scrotal edema resolving   BACK:  No evidence of injury and no step-off  MS: ulcers noted on bilateral knees and feet  NEURO:  Intubated, following commands  GLASGOW COMA SCALE: Eye opening: 4  spontaneous, Verbal response:  Trachostomy , Best motor response:  6 obeys commands  VASCULAR:  All pulses palpable and equal bilaterally  INTEGUMENTARY:  Pink, warm, and dry and anasarca    Laboratory Data: Pertinent labs available at the time of patient evaluation were reviewed, values can be reviewed in the results section of the EMR.  CBC Results Differential Results   No results found for this or any previous visit (from the past 30 hour(s)). No results found for this or any previous visit (from the past 30 hour(s)).   BMP Results Other Chemistries Results   Results for orders placed or performed during the hospital encounter of 04/28/18 (from the past 30 hour(s))   BASIC METABOLIC PANEL    Collection Time: 05/05/18  1:40 AM   Result Value    SODIUM 136    POTASSIUM 3.4 (L)    CHLORIDE 105    CO2 TOTAL 27    GLUCOSE 161 (H)    BUN 8    CREATININE 0.47 (L)    Recent Results (from the past 30 hour(s))   PHOSPHORUS    Collection Time: 05/05/18  1:40 AM   Result Value    PHOSPHORUS 2.5   MAGNESIUM    Collection  Time: 05/05/18  1:40 AM   Result Value    MAGNESIUM 2.0      Liver/Pancreas Enzyme Results Liver Function Results   No results found for this or any previous visit (from the past 30 hour(s)). No results found for this or any previous visit (from the past 30 hour(s)).   Cardiac Results Coags Results   No results found for this or any previous visit (from the past 30 hour(s)). No results found for this or any previous visit (from the past 30 hour(s)).       Radiographic Studies: Pertinent radiology result available at the time of patient evaluation were reviewed, reports can be reviewed in the results section of the EMR.    CXR- Ordered       SYSTEMS BASED MEDICAL DECISION MAKING    Active Hospital Problems    Diagnosis   . Primary Problem: Fall   . COPD (chronic obstructive pulmonary disease) (CMS HCC)   . Thrombocytopenia Unspecified   . Hypokalemia   . Alcohol abuse   . Troponin I above reference range   .  Acute respiratory failure (CMS HCC)   . Nasal fracture   . Abrasions of multiple sites   . Scalp hematoma   . Humeral fracture   . UTI (urinary tract infection)   . Left inguinal hernia   . Anasarca     Antonio Gillespie is a 58 y.o. male with PMH of atrial fibrillation, COPD, EtOH abuse who was transferred from outside facility. Per chart review, family of patient had not heard from him and called the police who found him in his home lying face down and covered in urine/feces.Patient was intubated at outside facility ED. Pan CT scan revealed a comminuted right humeral neck fracture, pulmonary artery enlargement, large left inguinal hernia. CT head showed bilateral scalp hematomas. CT c-spine negative. XR right hand and forearm were negative for fractures.            Neurologic  Pain: Pain Score (Numeric, Faces): Other  . CPOT Score: 0   . Agitation (RASS): RASS ([place] Agitation-Sedation Scale): -1-->drowsy   Delirium (CAM-ICU): CAM-ICU Assessment: Proceed with CAM  Feature 1: Acute Onset or Fluctuating Course: Positive  Feature 2: Inattention: Positive  Feature 3: Altered Level of Consciousness: Positive  Feature 4: Disorganized Thinking: Positive  . Overall CAM-ICU (Delirium Present): Positive  . Analgesia: Tylenol   . Sedation: Precedex 0.4  . Chemical Paralysis: None  . Delirium: Unable to assess   . Substance Use Replacement: Lorazepam  . Seizure Prophylaxis: Not Indicated  . Neurologic Diagnoses: Encephalopathic   . Spontaneous Awakening Trial: Failed  . Neurologic Plan of Care: Maintain sleep/awake cycle, promote sleep hygiene  . NCCU- consult orders placed recs appreciated    . ENT- consult placed recs appreciated    CTA Imaging demonstrated mild stenosis at origin of right vertebral artery and atherosclerosis without  evidence of traumatic injury   MRI brain showed no actue infarcts or intracranial mass. 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 Pulmonary Diagnoses: Acute Respiratory Failure, Acute Respiratory Insufficiency, Airway Compromise and COPD  . Mechanical Ventilation - Yes Mode CPAP, FiO2  30%, PEEP 5, PS 12, I:E ratio 1:2.5  . Airway - Tracheostomy   . Non-Invasive Ventilation - No  . Arterial Blood gas interpretation:PH 7.46, PCO2 40, PO2 101, HCO3 28.2, P/F ratio 337, Lactate 0.6  . Spontaneous Breathing Trial:Not Appropriate - Exclusion  . Pulmonary Plan of Care: Continue Aggressive pulmonary toilet   . Duo nebs- ordered   . Daily CXR  Cardiovascular  . Cardiovascular Diagnoses: History Afib   . Vasoactive Agents: None at this time   . Hemodynamic Monitoring:  ART line, non invasive blood pressure cuff   . Telemetry: Sinus Tachycardia  . Propanolol 10 mg TID  . Antiarrhythmics: None   . Blood Pressure Management: SBP goal less than 160  . Cardiovascular Plan of Care: Will continue to monitor and treat   Filed Vitals:    05/05/18 0200 05/05/18 0300 05/05/18 0309 05/05/18 0400   BP: 107/77 117/71     Pulse: 89 90     Resp: 16 19     Temp:    36.9 C (98.4 F)   SpO2: 97% 98% 98%      .   Renal       Intake/Output Summary (Last 24 hours) at 05/05/2018 0719  Last data filed at 05/05/2018 0700  Gross per 24 hour   Intake 1135.39 ml   Output 1305 ml   Net -169.61 ml         . Fluid Balance: Negative .7L yesterday, positive 0.4L since midnight   . urine output- 2.1L yesterday, 0.1 L since midnight   . Renal Diagnoses: None at this time   . Electrolyte Disorders: No gross abnormalities   . Renal Replacement Therapy: No  . Diuresis:No  . IV Fluids: Plasmalyte discontinued   . Renal Plan of Care: Will continue to monitor   Gastrointestinal  . Diet/Nutrition: Continuous Tube Feeds 60 ml/hr goal, osmolite, restarted  this morning   . Bowel Movement: Last Bowel Movement: 05/05/18  . Nutrition Access: PEG in place   . Bowel Regimen: Colace and Senokot  . GI Prophylaxis: H2 blocker  . GI Plan of Care: Will continue to monitor and treat as appropriate   Hematologic  . Anemia: Yes Acute Blood Loss H&H 7.3/24.4  . Transfusion Criteria: Hemoglobin less than 7  . Thrombocytopenia - No 172  . DVT Prophylaxis: Enoxaparin and SCDs/ Venodynes/Impulse boots  . Heme Plan of Care: Will continue to monitor and treat as appropriate   Immune/ID   WBC-5.8 stable, afebrile   Blood cultures: NGTD   Urine cultures:  NGTD   BAL: 100 Staphylococcus species, MRSA negative   OR cultures:  none   ABX: Nitrofurantoin for UTI (stop date 05/01/2018)  . ID Plan of Care: Will continue to monitor for infectious process   Endocrine  . Steroid Use:No  . Insulin use: Sliding Scale blood sugar  124-200  . Endocrine Plan of Care: Will continue to monitor   Musculoskeletal  . Weight Bearing: partial weight bearing  . Mobility/Activity: OOB as tolerated   . Head of Bed Elevated: 30 degrees   . MSK Plan of Care: Will need aggressive PT/OT for physical deconditioning   Integumentary  . Skin Breakdown: NONE except for surgical site  . Skin Prophylaxis: Mepilex and Turn every 2 hours  . Skin/Wound Plan of Care: Will continue to promote healthy skin integrity     Patient Lines/Drains/Airways Status    Active Line / Dialysis Catheter / Dialysis Graft / Drain / Airway /  Wound     Name: Placement date: Placement time: Site: Days:    Peripheral IV Ultrasound guided;Extended dwell catheter Right Forearm  05/01/18   1100   3    Peripheral IV Ultrasound guided;Extended dwell catheter Left;Lower Forearm  05/01/18   1400   3    Peripheral IV Extended dwell catheter;Ultrasound guided Left Forearm  05/01/18   1400   3    Arterial Line Right Radial  05/01/18   1425   Radial  3    Gastrostomy Tube Left  05/03/18   --   2    Foley Catheter  04/28/18   1615   6    Tracheostomy 8 Cuffed   05/03/18   --   2    Wound (Non-Surgical) Left Chest  04/28/18   1600   6    Wound (Non-Surgical) Left;Right Toe  04/28/18   1600   6    Wound (Non-Surgical) Left Mouth  04/28/18   1600   6    Wound (Non-Surgical) Left;Right Knee  04/28/18   1600   6    Surgical Incision Bilateral Neck  05/03/18   1525   1                Patient has decision making capacity: no  Family Updated today: no  Advance Directive: Medical Power of [place], Health Care Surrogate  Code Status Information     Code Status    Full Code          Co-Managing Services: Trauma, Orthopeadics, ENT     Samule Ohm, PA-C 05/05/2018, 07:19      ______________________________________________________________________________  SICU Attending  I personally saw and evaluated the patient. See mid-level's note for additional details. My findings/participation are    Acute respiratory failure- daily ATC trials and increase as tolerated.   Tolerated propranolol  D/c precedex, haldol prn and consider schedule seroquel if needed for agitation  Delirium management  I have seen and reviewed Radiology Images CXR.  My interpretation is: no consolidation, grossly unchanged.       Critical Care Attestation    I was present at the bedside of this critically ill patient for 35 minutes exclusive of procedures.  This patient suffers from failure or dysfunction of Neurologic/Sensory, Cardiovascular and Pulmonary system(s).  The care of this patient was in regard to managing (a) conditions(s) that has a high probability of sudden, clinically significant, or life-threatening deterioration and required a high degree of Attending Physician attention and direct involvement to intervene urgently. Data review and care planning was performed in direct proximity of the patient, examination was obviously performed in direct contact with the patient. All of this time was exclusive of procedure which will be documented elsewhere in the chart.    My critical care time is independent and  unique to other providers (no other providers saw patient for purposes of sicu evaluation)  My critical care time involved full attention to the patients' condition and included:    Review of nursing notes and/or old charts  Review of medications, allergies, and vital signs  Documentation time  Consultant collaboration on findings and treatment options  Care, transfer of care, and discharge plans  Ordering, interpreting, and reviewing diagnostic studies/tab tests  Obtaining necessary history from family, EMS, nursing staff and/or treating physicians    My critical care time did not include time spent teaching resident physician(s) or other services of resident physicians, or performing other reported procedures.  Total Critical  Care Time: 35 minutes    Ann Held, MD

## 2018-05-05 NOTE — Care Plan (Signed)
Pt turned q2h, no acute events overnight, restraints continued, skin and neurovascular checks Q2h, pt bathed and sheets changed, TF increased to goal and pt tolerating, no acute events overnight, VVS, plan to continue to ween vent to ATC

## 2018-05-05 NOTE — Progress Notes (Signed)
[facility]                                                      Trauma Progress Note                 Date of Birth:  Oct 08, 1960  Date of Admission:  04/28/2018  Date of service: 05/05/2018    Antonio Gillespie, 58 y.o., male Post trauma day 7 status post Fall    Events over the last 24 hours have included:  Trach/PEG performed yesterday    Subjective:    Patient remains on ventilator. Localizing to pain this morning.     Objective   24 Hour Summary:    Filed Vitals:    05/05/18 0200 05/05/18 0300 05/05/18 0309 05/05/18 0400   BP: 107/77 117/71     Pulse: 89 90     Resp: 16 19     Temp:    36.9 C (98.4 F)   SpO2: 97% 98% 98%      Labs:  Recent Labs     05/03/18  1632 05/04/18  0102 05/04/18  0114 05/04/18  1849 05/05/18  0140   WBC 6.1 5.7  --   --  5.8   HGB 8.0* 7.4*  --   --  7.3*   HCT 26.9* 24.8*  --   --  24.4*   SODIUM 137 137  --   --  136   POTASSIUM 3.8 3.7  --   --  3.4*   CHLORIDE 109 107  --   --  105   BICARBONATE 23.4  --  26.6* 26.1* 28.2*   BUN 9 8  --   --  8   CREATININE 0.54* 0.49*  --   --  0.47*   ANIONGAP 4 6  --   --  4   CALCIUM 7.3* 7.3*  --   --  7.4*   MAGNESIUM 2.1 1.9  --   --  2.0   PHOSPHORUS 3.3 2.9  --   --  2.5   INR 1.25*  --   --   --   --        Intake/Output Summary (Last 24 hours) at 05/05/2018 0608  Last data filed at 05/05/2018 0600  Gross per 24 hour   Intake 1171.99 ml   Output 1430 ml   Net -258.01 ml     Nutrition Management: MNT PROTOCOL FOR DIETITIAN  ADULT TUBE FEEDING - CONTINUOUS DRIP CONTINUOUS NO MEALS, TF ONLY; OSMOLITE 1.5; OG; Initial Rate (ml/hr): 20; Increase by: 10 ml every 6 hours; Goal Rate (ml/hr): 60  DIET NPO - NOW EXCEPT TUBE FEEDS Last Bowel Movement: 05/05/18  No results for input(s): ALBUMIN, PREALBUMIN in the last 72 hours.  Current Medications:    Current Facility-Administered Medications:   .  acetaminophen (TYLENOL) 160 mg per 5 mL liquid - grape flavored, 650 mg, Gastric (NG, OG, PEG, GT), Q4H PRN, Thimm, Terra, MD, 650 mg at 04/29/18  0316  .  aspirin chewable tablet 81 mg, 81 mg, Gastric (NG, OG, PEG, GT), Daily, McCoy, Skyler, MD, 81 mg at 05/04/18 0839  .  chlorhexidine gluconate (PERIDEX) 0.12% mouthwash, 15 mL, Swish & Spit, 2x/day, Kasandra Knudsen, Victorino Dike, MD, 15 mL at 05/04/18 2138  .  dexmedetomidine (PRECEDEX) 400 mcg  in NS premix infusion, 0.3 mcg/kg/hr (Ideal), Intravenous, Continuous, McCoy, Skyler, MD, Last Rate: 6.8 mL/hr at 05/04/18 2139, 0.4 mcg/kg/hr at 05/04/18 2139  .  enoxaparin PF (LOVENOX) 60 mg/0.6 mL SubQ injection, 0.5 mg/kg, Subcutaneous, Q12H, Babs Sciara, MD, 50 mg at 05/04/18 2138  .  famotidine (PEPCID) tablet, 20 mg, Gastric (NG, OG, PEG, GT), 2x/day, Kasandra Knudsen, Victorino Dike, MD, 20 mg at 05/04/18 2138  .  gabapentin (NEURONTIN)  per 6mL oral liquid, 300 mg, Gastric (NG, OG, PEG, GT), 3x/day, Babs Sciara, MD, 300 mg at 05/04/18 2138  .  haloperidol (HALDOL) 5 mg/mL injection, 5 mg, Intravenous, Q6H PRN, Carlynn Herald, MD, 5 mg at 05/04/18 2142  .  HYDROmorphone (DILAUDID) 2 mg/mL injection, 0.2 mg, Intravenous, Q4H PRN **OR** HYDROmorphone (DILAUDID) 2 mg/mL injection, 0.4 mg, Intravenous, Q4H PRN, Carlynn Herald, MD, 0.4 mg at 05/03/18 1137  .  ipratropium-albuterol 0.5 mg-3 mg(2.5 mg base)/3 mL Solution for Nebulization, 3 mL, Nebulization, Q6H, McCoy, Skyler, MD, 3 mL at 05/05/18 0107  .  lanolin-oxyquin-pet, hydrophil (BAG BALM) topical ointment, , Apply Topically, 2x/day PRN, Michaelle Birks, MD  .  [COMPLETED] LORazepam (ATIVAN) 2 mg/mL injection, 2 mg, Intravenous, Q8H, 2 mg at 05/02/18 1414 **FOLLOWED BY** [COMPLETED] LORazepam (ATIVAN) tablet, 1 mg, Gastric (NG, OG, PEG, GT), Q8H, 1 mg at 05/03/18 1334 **FOLLOWED BY** [COMPLETED] LORazepam (ATIVAN) tablet, 1 mg, Gastric (NG, OG, PEG, GT), Q12H, 1 mg at 05/04/18 1033 **FOLLOWED BY** LORazepam (ATIVAN) tablet, 1 mg, Gastric (NG, OG, PEG, GT), NIGHTLY, Babs Sciara, MD, 1 mg at 05/04/18 2139  .  INSERT & MAINTAIN PERIPHERAL IV ACCESS, , , UNTIL  DISCONTINUED **AND** DRESSING CHANGE Saline Lock; DRY STERILE, , , PRN **AND** NS flush syringe, 2 mL, Intracatheter, Q8HRS, Stopped at 05/05/18 0600 **AND** NS flush syringe, 2-6 mL, Intracatheter, Q1 MIN PRN, Dory Peru, Hansol, MD  .  nutrition protein supplement 15 g per 30 mL packet, 2 Packet, Gastric (NG, OG, PEG, GT), Daily, Michaelle Birks, MD, 30 g at 05/04/18 0839  .  polyethylene glycol (MIRALAX) oral packet, 17 g, Gastric (NG, OG, PEG, GT), Daily, Karma Greaser, MD, 17 g at 05/04/18 1610  .  prenatal vitamin-iron-folic acid tablet, 1 Tab, Gastric (NG, OG, PEG, GT), Daily, Babs Sciara, MD, 1 Tab at 05/04/18 [id number]  .  propranolol (INDERAL) tablet, 10 mg, Gastric (NG, OG, PEG, GT), 3x/day, McCoy, Skyler, MD, 10 mg at 05/04/18 2139  .  senna concentrate (SENNA)  per 15mL oral liquid, 5 mL, Gastric (NG, OG, PEG, GT), 2x/day, Kasandra Knudsen, Victorino Dike, MD, 176 mg at 05/04/18 2138  .  SSIP insulin R human (HUMULIN R) 100 units/mL injection, 0-12 Units, Subcutaneous, Q4H PRN, 2 Units at 05/05/18 0145 **AND** POCT WHOLE BLOOD GLUCOSE, , , Q6H, Carlynn Herald, MD  .  thiamine-vitamin B1 (BETAXIN) tablet, 100 mg, Gastric (NG, OG, PEG, GT), Daily, Babs Sciara, MD, 100 mg at 05/04/18 0840     Today's Physical Exam:  GEN:   Chronically ill appearing  HEENT:   Pupils equal   PULM:   Mechanically ventilated  CV:   Regular rate  ABD:   Soft, nondistended. Left inguinal hernia extending into scrotum, reducible, less prominent than previously, scrotal erythema improved   NEURO:   GCS 3-6-1T, localizing with left upper extremity.   Integumentary:  Feet and knees with scattered wounds, dry in nature, no surrounding erythema, dressings in place    Assessment/ Plan:   Active Hospital Problems   (*Primary Problem)  Diagnosis   . *Fall     "found down" and taken to outside hospital     . COPD (chronic obstructive pulmonary disease) (CMS HCC)     Chronic   . Thrombocytopenia Unspecified   . Hypokalemia   . Alcohol abuse   .  Troponin I above reference range   . Acute respiratory failure (CMS HCC)   . Nasal fracture     Acute vs chronic     . Abrasions of multiple sites   . Scalp hematoma   . Humeral fracture     nonop per ortho     . UTI (urinary tract infection)     At outside hospital. Repeat UA pending     . Left inguinal hernia     S/p reduction     . Anasarca     DVT prophylaxis:  Enoxaparin and SCDs/ Venodynes/Impulse boots  Anticoagulants (last 24 hours)     Date/Time Action Medication Dose    04/29/18 0827 Given    enoxaparin PF (LOVENOX) 40 mg/0.4 mL SubQ injection 40 mg    04/28/18 2020 Given    enoxaparin PF (LOVENOX) 40 mg/0.4 mL SubQ injection 40 mg        Nutrition: MNT PROTOCOL FOR DIETITIAN  ADULT TUBE FEEDING - CONTINUOUS DRIP CONTINUOUS NO MEALS, TF ONLY; OSMOLITE 1.5; OG; Initial Rate (ml/hr): 20; Increase by: 10 ml every 6 hours; Goal Rate (ml/hr): 60  DIET NPO - NOW EXCEPT TUBE FEEDS diet, Last Bowel Movement: 05/05/18  Activity: No limitations    Pain:   Analgesics (last 24 hours)     Date/Time Action Medication Dose    04/29/18 0316 Given    acetaminophen (TYLENOL) 160 mg per 5 mL liquid - grape flavored 650 mg    04/28/18 1720 Given    fentaNYL (SUBLIMAZE) 50 mcg/mL injection 100 mcg    04/28/18 1648 Given    fentaNYL (SUBLIMAZE) 50 mcg/mL injection 50 mcg    04/28/18 1633 Given    fentaNYL (SUBLIMAZE) 50 mcg/mL injection 100 mcg        PT Recommendations:  Pending    Plan:     Poor neurological exam   - EEG - no seizures   - NCCU consult   - CTA intra/extracranial - no significant findings   - MRI with nonspecific white matter changes   - MRI C-spine negative, collar removed    Left inguinal hernia   - Reducible at bedside   - Continue to monitor for overlying skin changes   - Will attempt daily reduction    Right humeral neck fracture   - Ortho consulted   - No operative intervention at this time   - Sling as needed    Nasal bone fracture   - ENT consult   - Will re-eval when more awake    Bilateral lower  extremity wounds   - ABI/PVR obtained, normal   - Wound care consult    Continue tube feeds  Wean ventilator as able, ATC as able  Continue DVT prophylaxis  Continue SICU care    Gerhard Munch, MD  05/05/2018, 12:54    Trauma/Surgical Critical Care/Acute Care Surgery Staff    I have seen and examined the patient. I have reviewed the resident note.  I agree with the findings and plan of care as documented on 05/05/2018 including history, review of systems, examination, and findings with exceptions/additions as noted/edited.     Labs and vital signs reviewed: Yes  Medical Decision Making component commentary:   Neuro:Acute pain secondary to trauma/surgery: Continue multimodal pain therapy with limitation of narcotics  Sleep hygeine and melatonin.  Alcohol history- thiamine and folate   Resp: Continue pulmonary hygiene/IS    Ac resp failure- vent per SICU  S/p trach   CV: Hemodynamically acceptable. Cont to monitor  Ensure hold parameters on antihypertensives to prevent hypotension  GI: Diet: NPO  Tube feeds   GI Prophylaxis: Pepcid   Bowel Reg-continue                Last Bowel Movement: 05/02/18  Endo: ISS for treatment of hyperglycemia.   Renal: Monitor Uo, Lytes-  Hypokalemia, replace K to prevent potential neuromuscular or  cardiac events, ileus, renal abnormalities or  glucose intolerance  Foley-critical I&O  Heme:  Ac Blood Loss Anemia-asx On supplemental vitamins  ID: Afebrile  Skin/Musculoskeletal: continue local wound care  DVT Prophylaxis: SCDs, Lovenox/Hep given increased risk of DVT formation secondary to trauma/decreased mobility/surgery  Therapies: PT/OT  Activity NWB RUEXT  Disposition: cont ICU     Electronically Signed by:    Warren Lacy MD, MS  Assistant Professor of Surgery  Trauma, Surgical Critical Care, and Acute Care Surgery  [facility]  05/05/2018 13:35

## 2018-05-05 NOTE — Care Plan (Signed)
OT attempted to provide care to Antonio Gillespie today at 14:06. Care could not be delivered at that time. Per nursing the patient is not appropriate to be seen by therapy at this time due to medical complexity.      Altamease Oiler. Katrinka Blazing, OTR/L  [phone]

## 2018-05-05 NOTE — Nurses Notes (Signed)
Restraint Continuation      Patient continues to have the following condition: restlessness.       Least restrictive alternatives attempted : Increase patient observation, concealed tubes/lines, decreased/removed stimulus, frequent orientation, reoriented to person, place, time, and treatment, encouraged relaxation techniques, redirected behavior and assessed meds/medical problems    Patient continues to exhibit the following behaviors: uncooperative, agitated and withdrawn    The restraint continued to facilitate medical/surgical treatment to ensure safety.    The patient will continue to be evaluated and assessments documented on the flowsheet to ensure that the patient is released from the restraint at the earliest possible time    Aurora Mask, RN  05/05/2018, 11:40

## 2018-05-05 NOTE — Care Management Notes (Signed)
[facility]  Care Management Note    Patient Name: Antonio Gillespie  Date of Birth: [birth year]  Sex: male  Date/Time of Admission: 04/28/2018  3:32 PM  Room/Bed: 04/A  Payor: /    LOS: 7 days   Primary Care Providers:  Selinda Eon, MD, MD (General)    Admitting Diagnosis:  Scalp hematoma [S00.03XA]    Assessment:      05/05/18 1001   Assessment Details   Assessment Type Continued Assessment   Date of Care Management Update 05/05/18   Date of Next DCP Update 05/08/18   Care Management Plan   Discharge Planning Status plan in progress   Projected Discharge Date 05/08/18   CM will evaluate for rehabilitation potential yes  (ongoing)   Discharge Needs Assessment   Discharge Facility/Level of Care Needs Undetermined at this time   Patient continues on ventilator for respiratory support via trach.  Plan is to continue to monitor patient, possible weaning of vent, medication management, PT/OT.  Patient has no medical insurance at this time.  CCC gave Rachael from Financial Service telephone number of [employer] to obtain needed information for assistance in applying for Medicaid for patient.      Discharge Plan:  Undetermined at this time  Discharge plans undetermined pending patient progress.    The patient will continue to be evaluated for developing discharge needs.     Case Manager: Sena Hitch, CLINICAL CARE COORDINATOR  Phone: [phone]

## 2018-05-05 NOTE — Care Plan (Signed)
Pt weaned to 11/19/28%. Pt placed on ATC and tolerated for 2 hrs, will attempt 2nd trial this evening . Pt requiring frequent suctioning for mod amts thick white/tan secretions. Precedex turned off on rounds, haldol given for agitiation. Pt getting duonebs Q6 hr, percuss/vibration via bed.

## 2018-05-05 NOTE — Nurses Notes (Signed)
Patient continues to have the following condition: restlessness.       Least restrictive alternatives attempted : Increase patient observation, concealed tubes/lines, decreased/removed stimulus, frequent orientation, reoriented to person, place, time, and treatment, encouraged relaxation techniques, redirected behavior and assessed meds/medical problems    Patient continues to exhibit the following behaviors: uncooperative, agitated and withdrawn    The restraint continued to facilitate medical/surgical treatment to ensure safety.    The patient will continue to be evaluated and assessments documented on the flowsheet to ensure that the patient is released from the restraint at the earliest possible time

## 2018-05-05 NOTE — Respiratory Therapy (Signed)
VENTILATOR - CPAP(PS) / SPONTANEOUS CONTINUOUS Discontinue   Duration: Until Specified    Priority: Routine       Question Answer Comment   FIO2 (%) 30    Peep(cm/H2O) 5    Pressure Support(cm/H2O) 12    Indications IMPROVE DISTRIBUTION OF VENTILATION    Invasive/Non-Invasive INVASIVE         Pt currently resting on above vent settings, tolerating well. No issues at this time. Pt's PS was increased from 8 to 12 due to tachypnea. Will attempt ATC again today. Pt currently receiving DuoNeb treatments Q6 and tolerating well. Will continue to monitor and wean as tolerated.

## 2018-05-06 ENCOUNTER — Inpatient Hospital Stay (HOSPITAL_COMMUNITY): Payer: Medicaid Other

## 2018-05-06 DIAGNOSIS — R509 Fever, unspecified: Secondary | ICD-10-CM

## 2018-05-06 DIAGNOSIS — Z930 Tracheostomy status: Secondary | ICD-10-CM

## 2018-05-06 DIAGNOSIS — S0003XA Contusion of scalp, initial encounter: Secondary | ICD-10-CM

## 2018-05-06 LAB — CBC
HCT: 24.2 % — ABNORMAL LOW (ref 38.9–52.0)
HGB: 7.3 g/dL — ABNORMAL LOW (ref 13.4–17.5)
MCH: 26.7 pg (ref 26.0–32.0)
MCH: 26.7 pg (ref 26.0–32.0)
MCHC: 30.2 g/dL — ABNORMAL LOW (ref 31.0–35.5)
MCV: 88.6 fL (ref 78.0–100.0)
MPV: 12.4 fL (ref 8.7–12.5)
PLATELETS: 224 x10ˆ3/uL (ref 150–400)
RBC: 2.73 x10ˆ6/uL — ABNORMAL LOW (ref 4.50–6.10)
RDW-CV: 21.9 % — ABNORMAL HIGH (ref 11.5–15.5)
WBC: 8.2 x10ˆ3/uL (ref 3.7–11.0)

## 2018-05-06 LAB — URINALYSIS, MACROSCOPIC
BILIRUBIN: NEGATIVE mg/dL
COLOR: NORMAL
GLUCOSE: NEGATIVE mg/dL
KETONES: NEGATIVE mg/dL
LEUKOCYTES: NEGATIVE WBCs/uL
NITRITE: NEGATIVE
PH: 7 (ref 5.0–8.0)
PROTEIN: NEGATIVE mg/dL
SPECIFIC GRAVITY: 1.019 (ref 1.005–1.030)
UROBILINOGEN: 4 mg/dL — AB

## 2018-05-06 LAB — MRSA SCREEN, PCR, RAPID: MRSA COLONIZATION SCREEN: NEGATIVE

## 2018-05-06 LAB — BASIC METABOLIC PANEL
ANION GAP: 6 mmol/L (ref 4–13)
BUN/CREA RATIO: 18 (ref 6–22)
BUN: 9 mg/dL (ref 8–25)
CALCIUM: 7.3 mg/dL — ABNORMAL LOW (ref 8.5–10.2)
CHLORIDE: 104 mmol/L (ref 96–111)
CO2 TOTAL: 28 mmol/L (ref 22–32)
CO2 TOTAL: 28 mmol/L (ref 22–32)
CREATININE: 0.51 mg/dL — ABNORMAL LOW (ref 0.62–1.27)
ESTIMATED GFR: >60 mL/min/1.73mˆ2 (ref >60)
GLUCOSE: 170 mg/dL — ABNORMAL HIGH (ref 65–139)
POTASSIUM: 3.4 mmol/L — ABNORMAL LOW (ref 3.5–5.1)
POTASSIUM: 3.4 mmol/L — ABNORMAL LOW (ref 3.5–5.1)
SODIUM: 138 mmol/L (ref 136–145)

## 2018-05-06 LAB — MAGNESIUM: MAGNESIUM: 2 mg/dL (ref 1.6–2.6)

## 2018-05-06 LAB — URINALYSIS, MICROSCOPIC
RBCS: 14 /HPF — ABNORMAL HIGH (ref <6.0)
WBCS: 4 /HPF — ABNORMAL HIGH (ref <4.0)

## 2018-05-06 LAB — POC BLOOD GLUCOSE (RESULTS)
GLUCOSE, POC: 178 mg/dL — ABNORMAL HIGH (ref 70–105)
GLUCOSE, POC: 157 mg/dL — ABNORMAL HIGH (ref 70–105)
GLUCOSE, POC: 172 mg/dL — ABNORMAL HIGH (ref 70–105)
GLUCOSE, POC: 153 mg/dL — ABNORMAL HIGH (ref 70–105)

## 2018-05-06 LAB — PHOSPHORUS: PHOSPHORUS: 2.7 mg/dL (ref 2.4–4.7)

## 2018-05-06 MED ORDER — POTASSIUM CHLORIDE 10 MEQ/100ML IN STERILE WATER INTRAVENOUS PIGGYBACK
10.0000 meq | INJECTION | INTRAVENOUS | Status: AC
Start: 2018-05-06 — End: 2018-05-06
  Administered 2018-05-06: 0 meq via INTRAVENOUS
  Administered 2018-05-06: 10 meq via INTRAVENOUS
  Administered 2018-05-06: 0 meq via INTRAVENOUS
  Administered 2018-05-06 (×2): 10 meq via INTRAVENOUS
  Administered 2018-05-06 (×2): 0 meq via INTRAVENOUS
  Administered 2018-05-06: 10 meq via INTRAVENOUS
  Filled 2018-05-06 (×4): qty 100

## 2018-05-06 MED ORDER — MELATONIN 3 MG TABLET
6.00 mg | ORAL_TABLET | Freq: Every evening | ORAL | Status: DC
Start: 2018-05-06 — End: 2018-05-06

## 2018-05-06 MED ORDER — MELATONIN 3 MG TABLET
6.00 mg | ORAL_TABLET | Freq: Every evening | ORAL | Status: DC
Start: 2018-05-06 — End: 2018-05-19
  Administered 2018-05-06 – 2018-05-18 (×13): 6 mg via GASTROSTOMY
  Filled 2018-05-06 (×13): qty 2

## 2018-05-06 MED ADMIN — gabapentin 250 mg/5 mL oral solution: GASTROSTOMY | @ 10:00:00

## 2018-05-06 MED ADMIN — potassium chloride 10 mEq/100mL in sterile water intravenous piggyback: INTRAVENOUS | @ 09:00:00

## 2018-05-06 MED ADMIN — ipratropium 0.5 mg-albuteroL 3 mg (2.5 mg base)/3 mL nebulization soln: RESPIRATORY_TRACT | @ 07:00:00

## 2018-05-06 MED ADMIN — polyethylene glycol 3350 17 gram oral powder packet: GASTROSTOMY | @ 09:00:00

## 2018-05-06 MED ADMIN — insulin regular human 100 unit/mL injection solution: SUBCUTANEOUS | @ 18:00:00

## 2018-05-06 MED ADMIN — acetaminophen 325 mg/10.15 mL oral suspension: GASTROSTOMY | @ 12:00:00

## 2018-05-06 MED ADMIN — famotidine 20 mg tablet: GASTROSTOMY | @ 22:00:00

## 2018-05-06 MED ADMIN — promethazine 25 mg tablet: INTRAVENOUS | @ 08:00:00

## 2018-05-06 MED ADMIN — NICOTINE INHALER MOUTHPIECE: GASTROSTOMY | @ 10:00:00

## 2018-05-06 MED ADMIN — sodium chloride 0.9 % (flush) injection syringe: @ 14:00:00

## 2018-05-06 MED ADMIN — clonazePAM 1 mg tablet: GASTROSTOMY | @ 22:00:00

## 2018-05-06 MED ADMIN — sodium chloride 0.9 % intravenous solution: GASTROSTOMY | @ 21:00:00

## 2018-05-06 MED ADMIN — ADULT CUSTOM PARENTERAL NUTRITION: GASTROSTOMY | @ 22:00:00

## 2018-05-06 MED ADMIN — POTASSIUM PHOSPHATE IVPB: GASTROSTOMY | @ 14:00:00

## 2018-05-06 MED ADMIN — oxyCODONE 5 mg tablet: INTRAVENOUS | @ 07:00:00

## 2018-05-06 MED ADMIN — insulin lispro 100 unit/mL subcutaneous solution: GASTROSTOMY | @ 10:00:00

## 2018-05-06 MED ADMIN — HYDROcodone 5 mg-acetaminophen 325 mg tablet: GASTROSTOMY | @ 22:00:00

## 2018-05-06 NOTE — Care Plan (Signed)
Patient is on high risk precautions per [facility] Risk Assessment Tool; patient reassessed throughout for modifiable risk factors.  Vital signs stable throughout shift see flow sheets. Q2 hour turns/repositioning maintained per hospital policy to improve/maintain skin integrity. Q1 hour safety checks completed. Q4 hour oral hygiene performed. Patient assessed and reassessed to ensure acceptable level of pain. See EMAR for PRN/Scheduled medication administered.   Pressure ulcer Prophylaxis, bundle and venous thromboembolism prophylaxis utilized.  Family and patient updated on plan of care.    3/21: ATC for 4 consecutive hrs today with tachypnea in the 30s, but does well otherwise. MRSA swab resent and resulted negative, STD UA resent as well, Cooling blanket applied to control hyperthermia, PRN tylenol given as well. Mepitels replaced on abrasions per wound care instructions, Meticulous trach care performed frequently due to thick secretions and leaking, patient bathed and linens changed. Current GCS 3-4/6/1 and seems to be improving slightly from yesterday.

## 2018-05-06 NOTE — Nurses Notes (Signed)
05/06/18 0800   Restraint Type(Q2 Hours)   Soft Restraint Right Wrist DISCONTINUED   Soft Restraint Left Wrist DISCONTINUED     Restraint Discontinuation    The need for the restrain is no longer present and the patient's needs can be addressed using less restrictive alternatives.

## 2018-05-06 NOTE — Care Plan (Signed)
VENTILATOR - CPAP(PS) / SPONTANEOUS CONTINUOUS    Discontinue   Duration: Until Specified    Priority: Routine       Question Answer Comment   FIO2 (%) 30    Peep(cm/H2O) 5    Pressure Support(cm/H2O) 10    Indications IMPROVE DISTRIBUTION OF VENTILATION    Invasive/Non-Invasive INVASIVE        05/05/18 1837   05/04/18 1200  OXYGEN - AEROSOL TRACH COLLAR CONTINUOUS    Discontinue   Comments: FIO2 > 50% Will be set up with Double Flow O2   Duration: Until Specified    Priority: Routine       Question Answer Comment   FIO2 (%) 40    Indications for O2 IMPROVE OXYGENATION          Will continue to follow and monitor patient's respiratory status weaning as tolerated.

## 2018-05-06 NOTE — Care Plan (Signed)
Pt turned q2h, pt spiked fever at start of shift, cultures, UA, BAL, chest xray, tylenol given, and cooling blanket initiated, restraints continued, skin and neurovascular checks Q2h, pt bathed and sheets changed, TF continued and pt tolerating, VVS, OOBTC, plan to continue to monitor temp and ween vent to ATC

## 2018-05-06 NOTE — Progress Notes (Signed)
[facility]                                               SICU PROGRESS NOTE    Antonio Gillespie, Antonio Gillespie  Date of Admission:  04/28/2018  Date of Service: 05/06/2018  Date of Birth:  [birth year]    Primary Attending:  Kasandra Knudsen  Primary Service:  Trauma Blue    Post Op Day:3 Days Post-Op S/P Procedure(s) (LRB):  TRACHEOSTOMY (N/A)  GASTROSCOPY WITH PLACEMENT PEG TUBE (N/A)     LOS: 8 days      Last 24 hours  Pt had fever overnight (39.3), cultures sent   Weaned to ATC for 2 hours before becoming tachypnic   Precedex and haldol discontinued     Subjective:    Follows commands. Febrile overnight but has improved. No complaints.     Vital Signs:  Temp (24hrs) Max:39.3 C (102.7 F)      Systolic (24hrs), Avg:118 , Min:80 , Max:142     Diastolic (24hrs), Avg:75, Min:60, Max:102    Temp  Avg: 38.2 C (100.7 F)  Min: 37.3 C (99.1 F)  Max: 39.3 C (102.7 F)  MAP (Non-Invasive)  Avg: 87.9 mmHG  Min: 71 mmHG  Max: 113 mmHG  Pulse  Avg: 104.2  Min: 95  Max: 116  Resp  Avg: 26.1  Min: 20  Max: 39  SpO2  Avg: 98.4 %  Min: 96 %  Max: 100 %  Pain Score (Numeric, Faces): Other    Meds  No current outpatient medications on file.     acetaminophen (TYLENOL) 160 mg per 5 mL liquid - grape flavored, 650 mg, Gastric (NG, OG, PEG, GT), Q4H PRN  aspirin chewable tablet 81 mg, 81 mg, Gastric (NG, OG, PEG, GT), Daily  chlorhexidine gluconate (PERIDEX) 0.12% mouthwash, 15 mL, Swish & Spit, 2x/day  enoxaparin PF (LOVENOX) 60 mg/0.6 mL SubQ injection, 0.5 mg/kg, Subcutaneous, Q12H  famotidine (PEPCID) tablet, 20 mg, Gastric (NG, OG, PEG, GT), 2x/day  gabapentin (NEURONTIN)  per 6mL oral liquid, 300 mg, Gastric (NG, OG, PEG, GT), 3x/day  haloperidol (HALDOL) 5 mg/mL injection, 5 mg, Intravenous, Q6H PRN  ipratropium-albuterol 0.5 mg-3 mg(2.5 mg base)/3 mL Solution for Nebulization, 3 mL, Nebulization, Q6H  lanolin-oxyquin-pet, hydrophil (BAG BALM) topical ointment, , Apply Topically, 2x/day PRN  melatonin tablet, 6 mg,  Gastric (NG, OG, PEG, GT), NIGHTLY  NS flush syringe, 2 mL, Intracatheter, Q8HRS    And  NS flush syringe, 2-6 mL, Intracatheter, Q1 MIN PRN  nutrition protein supplement 15 g per 30 mL packet, 2 Packet, Gastric (NG, OG, PEG, GT), Daily  polyethylene glycol (MIRALAX) oral packet, 17 g, Gastric (NG, OG, PEG, GT), Daily  prenatal vitamin-iron-folic acid tablet, 1 Tab, Gastric (NG, OG, PEG, GT), Daily  propranolol (INDERAL) tablet, 10 mg, Gastric (NG, OG, PEG, GT), 3x/day  senna concentrate (SENNA)  per 15mL oral liquid, 5 mL, Gastric (NG, OG, PEG, GT), 2x/day  SSIP insulin R human (HUMULIN R) 100 units/mL injection, 0-12 Units, Subcutaneous, Q4H PRN        Physical Exam:  HYQ:MVHQIONGEXB ill, frail male appears older than stated age   HEENT:Pupils 6mm bilaterally,large posterior scalp hematoma  NECK:Trachea midline,, tracheostomy in place  PULM:Diminished breath sounds bilaterally, course, Tracheostomy in place   CARDIAC:Afib   CHEST::No abrasions or contusions  MWU:XLKGMWN soft, and nondistended  PELVIS:No evidence of injury  GU/RECTAL:large scrotal edema resolving   BACK:No evidence of injury and no step-off  ZO:XWRUEA noted on bilateral knees and feet  NEURO:Intubated, following commands  GLASGOW COMA SCALE:Eye opening:4 spontaneous, Verbal response:Trachostomy , Best motor response:6 obeys commands  VASCULAR:All pulses palpable and equal bilaterally  INTEGUMENTARY:Pink, warm, and dry andanasarca    Labs:  I have reviewed all lab results.    Recent Imaging:  Results for orders placed or performed during the hospital encounter of 04/28/18 (from the past 72 hour(s))   XR AP MOBILE CHEST     Status: None    Narrative    XR AP MOBILE CHEST performed on 05/03/2018 7:06 PM.  TECHNIQUE: 1 view/1 image(s) submitted for interpretation.    REASON FOR EXAM:  58 years old Male,  Tachypnea      Impression    FINDINGS/IMPRESSION:  1.  Stable appearance of small bilateral pleural effusions  without evidence  of pulmonary edema, pneumothorax, or focal pulmonary consolidation.[name] Upper  normal size heart. Right sided aortic arch. Tracheostomy tube is in place,  its tip in expected trachea location. Comparison with chest radiograph  earlier same day at 3:49 AM.   Moderate scoliosis of thoracic spine.     XR AP MOBILE CHEST     Status: None    Narrative    Antonio Gillespie  Male, 58 years old.    XR AP MOBILE CHEST performed on 05/04/2018 3:44 AM.    REASON FOR EXAM:  Follow up respiratory failure    TECHNIQUE: 1 view/1 image(s) submitted for interpretation.    FINDINGS: Compared to 05/03/2018. The heart is enlarged. The lungs are clear  without focal consolidation. Tracheostomy tube remains in place. Right  aortic arch is noted.        Impression    No focal consolidation.     XR AP MOBILE CHEST     Status: None    Narrative    Antonio Gillespie  Male, 58 years old.    XR AP MOBILE CHEST performed on 05/05/2018 7:39 AM.    REASON FOR EXAM:  tracheostomy    TECHNIQUE: 1 views/1 images submitted for interpretation.    COMPARISON:  05/04/2018    FINDINGS:  The heart is enlarged. There is no focal consolidation, pleural  effusion, or pneumothorax. Tracheostomy is unchanged.      Impression    Stable cardiomegaly with no new abnormality seen.     XR AP MOBILE CHEST     Status: None    Narrative    Antonio Gillespie  Male, 58 years old.    XR AP MOBILE CHEST performed on 05/05/2018 9:25 PM.    REASON FOR EXAM:  New onset fever    TECHNIQUE: 1 view/2 image(s) submitted for interpretation.    COMPARISON: 05/05/2018.    FINDINGS:      LUNGS: No focal consolidation.    HEART AND VASCULATURE: Persistent cardiomegaly.    PLEURA: No pleural effusion is present. No pneumothorax is demonstrated.    SUPPORT DEVICES:  Stable tracheostomy tube.      Impression    Stable cardiomegaly without new focal consolidation.     XR AP MOBILE CHEST     Status: None    Narrative    Antonio Gillespie  Male, 58 years old.    XR AP MOBILE CHEST performed on  05/06/2018 6:08 AM.    REASON FOR EXAM:  tracheostomy    TECHNIQUE: 1 view/1  image(s) submitted for interpretation.    COMPARISON: 05/05/2018    FINDINGS:      LUNGS: No focal consolidation.    HEART AND VASCULATURE: Stable heart size.    PLEURA: No pleural effusion is present. No pneumothorax is demonstrated.      Impression    Stable aeration without new focal consolidation.         Assessment/ Plan:  Active Hospital Problems    Diagnosis   . Primary Problem: Fall   . COPD (chronic obstructive pulmonary disease) (CMS HCC)   . Thrombocytopenia Unspecified   . Hypokalemia   . Alcohol abuse   . Troponin I above reference range   . Acute respiratory failure (CMS HCC)   . Nasal fracture   . Abrasions of multiple sites   . Scalp hematoma   . Humeral fracture   . UTI (urinary tract infection)   . Left inguinal hernia   . Anasarca       KALEAB FRASIER is a 58 y.o. male who is 3 Days Post-Op S/P Procedure(s) (LRB):  TRACHEOSTOMY (N/A)  GASTROSCOPY WITH PLACEMENT PEG TUBE (N/A)      NEURO:  Sz prophylaxis: None  Melatonin  Prenatal vitamin  Sedation/analgesia: gabapentin, tylenol, haldol PRN    CARDIOVASCULAR:  Systolic (24hrs), Avg:118 , Min:80 , Max:142     Diastolic (24hrs), Avg:75, Min:60, Max:102     ART-Line  MAP: 85 mmHg   Troponins: No results found for: CKMB   Meds: aspirin, propanolol  Pressors: None        PULMONARY:  Airway Ventilator Settings   Tracheostomy 8 Cuffed (Active)   Trach Status Secured 05/06/2018  4:00 AM   Site Condition Intact 05/06/2018  4:00 AM   Trach Care Site Cleaned;Inner Cannula Changed;Dressing Changed 05/06/2018  4:00 AM   Trach Supplies @ Bedside? Yes 05/06/2018  4:00 AM    Ventilator Settings:  Mode: SPONT  Set PEEP: 5 cmH2O  Pressure Support: 10 cmH2O  FiO2: 30 %  I:E Ratio: 1:2.1   SpO2  Avg: 98.4 %  Min: 96 %  Max: 100 %  Blood Gas:  No results found for this encounter  Nebs: duoneb  Imaging: CXR shows tracheostomy tube in appropriate position with no evidence of pneumothorax or acute  changes when compared to previous CXR  Continue to wean to ATC    GI:  MNT PROTOCOL FOR DIETITIAN  ADULT TUBE FEEDING - CONTINUOUS DRIP CONTINUOUS NO MEALS, TF ONLY; OSMOLITE 1.5; OG; Initial Rate (ml/hr): 20; Increase by: 10 ml every 6 hours; Goal Rate (ml/hr): 60  DIET NPO - NOW EXCEPT TUBE FEEDS   Recent Labs     05/05/18  2221   BILIRUBIN Negative   Protein for nutrition   Last BM: Last Bowel Movement: 05/05/18  Proph:  Pepcid, Miralax, senna     RENAL/GU:  Recent Labs     05/04/18  0102 05/04/18  0114 05/04/18  1849 05/05/18  0140 05/05/18  2221 05/06/18  0112   SODIUM 137  --   --  136  --  138   POTASSIUM 3.7  --   --  3.4*  --  3.4*   CHLORIDE 107  --   --  105  --  104   BICARBONATE  --  26.6* 26.1* 28.2*  --   --    BUN 8  --   --  8  --  9   CREATININE 0.49*  --   --  0.47*  --  0.51*   GLUCOSE  --   --   --   --  Negative  --    ANIONGAP 6  --   --  4  --  6   CALCIUM 7.3*  --   --  7.4*  --  7.3*   MAGNESIUM 1.9  --   --  2.0  --  2.0   PHOSPHORUS 2.9  --   --  2.5  --  2.7       I/O last 24 hours:      Intake/Output Summary (Last 24 hours) at 05/06/2018 1012  Last data filed at 05/06/2018 0700  Gross per 24 hour   Intake 1680 ml   Output 1000 ml   Net 680 ml     I/O current shift:  03/21 0700 - 03/21 1859  In: 460 [I.V.:400; GT:60]  Out: 30 [Urine:30]  Foley in critically ill pt for strict I/O's    HEME:  Recent Labs     05/03/18  1632 05/04/18  0102 05/05/18  0140 05/06/18  0112   HGB 8.0* 7.4* 7.3* 7.3*   HCT 26.9* 24.8* 24.4* 24.2*   PLTCNT 150 146* 172 224   APTT 32.2  --   --   --    INR 1.25*  --   --   --      Transfusions: None  Proph: SCD's, Lovenox     ID:  Temp (24hrs) Max:39.3 C (102.7 F)    Recent Labs     05/04/18  0102 05/05/18  0140 05/06/18  0112   WBC 5.7 5.8 8.2     G+C re-ordered  MRSA swab sent  Cultures: pending  ABX: None    ENDO:  SSI    MSK:  No skin breakdown    OTHER:  Lines:  Peripheral IV x3, foley, G-tube, tracheostomy   Activity: Bedrest, HOB 30deg  PT/OT:  ordered  MNT:   ordered    PLAN:  K replaced overnight  G+C re-ordered  MRSA swab sent  F/u cultures  Continue to wean to ATC      Carlynn Herald, MD 05/06/2018, 10:12    ______________________________________________________________________________  SICU Attending    Late entry for 05/06/18.   I saw and examined the patient.  I reviewed the resident's note.  I agree with the findings and plan of care as documented in the resident's note.  Any exceptions/additions are edited/noted.  Resend G+C PCR, result has been missing for 2 days  Respiratory failure - ATC trial x2 again today. Need to keep increasing duration  Could probably increase his propranolol but that may mask worsening/developing sepsis  Cultures sent for his fevers, no leukocytosis      Critical Care Attestation    I was present at the bedside of this critically ill patient for 35 minutes exclusive of procedures.  This patient suffers from failure or dysfunction of Neurologic/Sensory, Pulmonary and Immue/Allergic system(s).  The care of this patient was in regard to managing (a) conditions(s) that has a high probability of sudden, clinically significant, or life-threatening deterioration and required a high degree of Attending Physician attention and direct involvement to intervene urgently. Data review and care planning was performed in direct proximity of the patient, examination was obviously performed in direct contact with the patient. All of this time was exclusive of procedure which will be documented elsewhere in the chart.    My critical care time is independent and unique to  other providers (no other providers saw patient for purposes of sicu evaluation)  My critical care time involved full attention to the patients' condition and included:    Review of nursing notes and/or old charts  Review of medications, allergies, and vital signs  Documentation time  Consultant collaboration on findings and treatment options  Care, transfer of care, and discharge  plans  Ordering, interpreting, and reviewing diagnostic studies/tab tests  Obtaining necessary history from family, EMS, nursing staff and/or treating physicians    My critical care time did not include time spent teaching resident physician(s) or other services of resident physicians, or performing other reported procedures.  Total Critical Care Time: 35 minutes    Babs Sciara, MD

## 2018-05-06 NOTE — Care Plan (Signed)
Pt doing better with ATC trials today, 4 hrs this a.m. will try for another 4 hr trial this evening. Pt out of bed to chair, getting duonebs Q6 hr. Pt suctioning less frequently today. Pt to rest on vent tonight, currently resting on PSV10/5/30%.

## 2018-05-06 NOTE — Progress Notes (Addendum)
[facility]                                                      Trauma Progress Note                 Date of Birth:  April 18, 1960  Date of Admission:  04/28/2018  Date of service: 05/06/2018    Jearld Lesch, 58 y.o., male Post trauma day 8 status post Fall    Events over the last 24 hours have included:  TF at goal (60/h)  ATC Trials - tolerated 2 hours   Precedex weaned - following commands  Febrile to 102.7, tachycardic, and tachypnic - cultures sent  CXR - relatively unchanged  BAL - 4+ PMN, 1+ GPC/Clusters  BCx2 - Pending  MRSA PCR - Neg    Subjective:    Patient remains on ventilator. Localizing to pain this morning. More responsive to stimulation with decreased Precedex. Tolerated ATC trial for 2hrx2 yesterday.    Objective   24 Hour Summary:    Filed Vitals:    05/06/18 0200 05/06/18 0300 05/06/18 0336 05/06/18 0400   BP: 109/67 116/75  119/71   Pulse: (!) 102 (!) 103  (!) 102   Resp: (!) 22 (!) 24  (!) 21   Temp:    (!) 38.2 C (100.8 F)   SpO2: 100% 100% 100% 99%     Labs:  Recent Labs     05/03/18  1632 05/04/18  0102 05/04/18  0114 05/04/18  1849 05/05/18  0140 05/05/18  2221 05/06/18  0112   WBC 6.1 5.7  --   --  5.8  --  8.2   HGB 8.0* 7.4*  --   --  7.3*  --  7.3*   HCT 26.9* 24.8*  --   --  24.4*  --  24.2*   SODIUM 137 137  --   --  136  --  138   POTASSIUM 3.8 3.7  --   --  3.4*  --  3.4*   CHLORIDE 109 107  --   --  105  --  104   BICARBONATE 23.4  --  26.6* 26.1* 28.2*  --   --    BUN 9 8  --   --  8  --  9   CREATININE 0.54* 0.49*  --   --  0.47*  --  0.51*   GLUCOSE  --   --   --   --   --  Negative  --    ANIONGAP 4 6  --   --  4  --  6   CALCIUM 7.3* 7.3*  --   --  7.4*  --  7.3*   MAGNESIUM 2.1 1.9  --   --  2.0  --  2.0   PHOSPHORUS 3.3 2.9  --   --  2.5  --  2.7   INR 1.25*  --   --   --   --   --   --        Intake/Output Summary (Last 24 hours) at 05/06/2018 0650  Last data filed at 05/06/2018 0600  Gross per 24 hour   Intake 1492.4 ml   Output 1030 ml   Net 462.4 ml     Nutrition  Management:  MNT PROTOCOL FOR DIETITIAN  ADULT TUBE FEEDING - CONTINUOUS DRIP CONTINUOUS NO MEALS, TF ONLY; OSMOLITE 1.5; OG; Initial Rate (ml/hr): 20; Increase by: 10 ml every 6 hours; Goal Rate (ml/hr): 60  DIET NPO - NOW EXCEPT TUBE FEEDS Last Bowel Movement: 05/05/18  No results for input(s): ALBUMIN, PREALBUMIN in the last 72 hours.  Current Medications:    Current Facility-Administered Medications:   .  acetaminophen (TYLENOL) 160 mg per 5 mL liquid - grape flavored, 650 mg, Gastric (NG, OG, PEG, GT), Q4H PRN, Thimm, Terra, MD, 650 mg at 05/06/18 0407  .  aspirin chewable tablet 81 mg, 81 mg, Gastric (NG, OG, PEG, GT), Daily, McCoy, Skyler, MD, 81 mg at 05/05/18 0923  .  chlorhexidine gluconate (PERIDEX) 0.12% mouthwash, 15 mL, Swish & Spit, 2x/day, Harvin Hazel, Anderson Malta, MD, 15 mL at 05/05/18 2130  .  enoxaparin PF (LOVENOX) 60 mg/0.6 mL SubQ injection, 0.5 mg/kg, Subcutaneous, Q12H, Ann Held, MD, 50 mg at 05/05/18 2132  .  famotidine (PEPCID) tablet, 20 mg, Gastric (NG, OG, PEG, GT), 2x/day, Harvin Hazel, Anderson Malta, MD, 20 mg at 05/05/18 2133  .  gabapentin (NEURONTIN) 357m per 656moral liquid, 300 mg, Gastric (NG, OG, PEG, GT), 3x/day, Bardes, Wendy, MD, 300 mg at 05/05/18 2134  .  haloperidol (HALDOL) 5 mg/mL injection, 5 mg, Intravenous, Q6H PRN, BoVerlon AuMD, 5 mg at 05/05/18 1013  .  ipratropium-albuterol 0.5 mg-3 mg(2.5 mg base)/3 mL Solution for Nebulization, 3 mL, Nebulization, Q6H, McCoy, Skyler, MD, 3 mL at 05/06/18 0120  .  lanolin-oxyquin-pet, hydrophil (BAG BALM) topical ointment, , Apply Topically, 2x/day PRN, BaMarquette OldMD  .  INSERT & MAINTAIN PERIPHERAL IV ACCESS, , , UNTIL DISCONTINUED **AND** DRESSING CHANGE Saline Lock; DRY STERILE, , , PRN **AND** NS flush syringe, 2 mL, Intracatheter, Q8HRS, Stopped at 05/06/18 0600 **AND** NS flush syringe, 2-6 mL, Intracatheter, Q1 MIN PRN, ChAgustina CaroliMD  .  nutrition protein supplement 15 g per 30 mL packet, 2 Packet, Gastric  (NG, OG, PEG, GT), Daily, BaMarquette OldMD, 30 g at 05/05/18 0900  .  polyethylene glycol (MIRALAX) oral packet, 17 g, Gastric (NG, OG, PEG, GT), Daily, SpDimple NanasMD, 17 g at 05/05/18 0924  .  potassium chloride 10 mEq in SW 100 mL premix infusion, 10 mEq, Intravenous, Q1H, SpDimple NanasMD, Last Rate: 100 mL/hr at 05/06/18 0800, 10 mEq at 05/06/18 0800  .  prenatal vitamin-iron-folic acid tablet, 1 Tab, Gastric (NG, OG, PEG, GT), Daily, BaAnn HeldMD, 1 Tab at 05/05/18 [id number].  propranolol (INDERAL) tablet, 10 mg, Gastric (NG, OG, PEG, GT), 3x/day, McCoy, Skyler, MD, 10 mg at 05/05/18 2134  .  senna concentrate (SENNA) 5281mer 24m24mal liquid, 5 mL, Gastric (NG, OG, PEG, GT), 2x/day, KnigHarvin HazelnnAnderson Malta, Stopped at 05/05/18 2100  .  SSIP insulin R human (HUMULIN R) 100 units/mL injection, 0-12 Units, Subcutaneous, Q4H PRN, 2 Units at 05/06/18 0559 **AND** POCT WHOLE BLOOD GLUCOSE, , , Q6H, BootVerlon Au     Today's Physical Exam:   GEN:   Chronically ill appearing  HEENT:   Pupils equal  PULM:   ATC  CV:   Regular rate  ABD:   Soft, nondistended. Left inguinal hernia extending into scrotum, reducible, less prominent than previously, scrotal erythema improved   NEURO:   GCS 3-6-1T, localizing with left upper extremity.   MSK: Discoloration of bilateral toes  Integumentary:  Feet and knees with scattered wounds, dry  in nature, no surrounding erythema, dressings in place    Assessment/ Plan:   Active Hospital Problems   (*Primary Problem)    Diagnosis   . *Fall     "found down" and taken to outside hospital     . COPD (chronic obstructive pulmonary disease) (CMS HCC)     Chronic   . Thrombocytopenia Unspecified   . Hypokalemia   . Alcohol abuse   . Troponin I above reference range   . Acute respiratory failure (CMS HCC)   . Nasal fracture     Acute vs chronic     . Abrasions of multiple sites   . Scalp hematoma   . Humeral fracture     nonop per ortho     . UTI (urinary tract infection)      At outside hospital. Repeat UA pending     . Left inguinal hernia     S/p reduction     . Anasarca     DVT prophylaxis:  Enoxaparin and SCDs/ Venodynes/Impulse boots  Anticoagulants (last 24 hours)     Date/Time Action Medication Dose    05/05/18 2132 Given    enoxaparin PF (LOVENOX) 60 mg/0.6 mL SubQ injection 50 mg    05/05/18 3382 Given    enoxaparin PF (LOVENOX) 60 mg/0.6 mL SubQ injection 50 mg        Nutrition: MNT PROTOCOL FOR DIETITIAN  ADULT TUBE FEEDING - CONTINUOUS DRIP CONTINUOUS NO MEALS, TF ONLY; OSMOLITE 1.5; OG; Initial Rate (ml/hr): 20; Increase by: 10 ml every 6 hours; Goal Rate (ml/hr): 60  DIET NPO - NOW EXCEPT TUBE FEEDS diet, Last Bowel Movement: 05/05/18  Activity: No limitations    Pain:   Analgesics (last 24 hours)     Date/Time Action Medication Dose    05/06/18 0407 Given    acetaminophen (TYLENOL) 160 mg per 5 mL liquid - grape flavored 650 mg    05/05/18 2134 Given    acetaminophen (TYLENOL) 160 mg per 5 mL liquid - grape flavored 650 mg        PT Recommendations:  Pending    Plan:     Poor neurological exam   - EEG - no seizures   - NCCU consult   - CTA intra/extracranial - no significant findings   - MRI with nonspecific white matter changes   - MRI C-spine negative, collar removed    Left inguinal hernia   - Reducible at bedside   - Continue to monitor for overlying skin changes   - Will attempt daily reduction    Right humeral neck fracture   - Ortho consulted   - No operative intervention at this time   - Sling as needed    Nasal bone fracture   - ENT consult   - Will re-eval when more awake    Bilateral lower extremity wounds   - ABI/PVR obtained, normal   - Wound care consult    Continue tube feeds (At goal)  Wean ventilator as able, Continue ATC trials  Continue DVT prophylaxis  Continue SICU care    Bradd Burner, MD  Resident, PGY-1  Department of Orthopaedics  Pager - [facility]  05/06/2018 06:56     Trauma/Surgical Critical Care/Acute Care Surgery Staff    I have seen and  examined the patient. I have reviewed the resident note.  I agree with the findings and plan of care as documented on 05/06/2018 including history, review of systems, examination, and findings with exceptions/additions  as noted/edited.     Labs and vital signs reviewed: Yes    Medical Decision Making component commentary:  Neuro:Acute pain secondary to trauma/surgery: Continue multimodal pain therapy with limitation of narcotics. More awake today. Precedex off.  On propranolol  Sleep hygeine and melatonin.  Alcohol history- thiamine and folate  Resp: Continue pulmonary hygiene/IS  S/p trach, Ac resp failure- vent per SICU. Ciont ATC wean  CV: Hemodynamically acceptable. Cont to monitor. Afib continues.  Ensure hold parameters on antihypertensives to prevent hypotension  TT:SVXB: NPO Tube feeds   GI Prophylaxis: Pepcid  Bowel Reg-continueLast Bowel Movement: 05/06/18  Endo: ISS for treatment of hyperglycemia.   Renal:Monitor Uo, Lytes-no replacements  Foley-critical I&O  Heme:Ac Blood Loss Anemia-asx On supplemental vitamins  LT:[id number]. G&C reordered  Cultures pending   Skin/Musculoskeletal: continue local wound care  DVT Prophylaxis:SCDs, Lovenox  increased risk of DVT formation secondary to trauma/decreased mobility/surgery  Therapies: PT/OT  ActivityNWB RUEXT  Disposition: cont ICU    Electronically Signed by:    Tawana Scale MD, MS  Assistant Professor of Surgery  Trauma, Surgical Critical Care, and [place]  05/06/2018 13:24

## 2018-05-07 ENCOUNTER — Inpatient Hospital Stay (HOSPITAL_COMMUNITY): Payer: Medicaid Other

## 2018-05-07 DIAGNOSIS — I517 Cardiomegaly: Secondary | ICD-10-CM

## 2018-05-07 DIAGNOSIS — R918 Other nonspecific abnormal finding of lung field: Secondary | ICD-10-CM

## 2018-05-07 DIAGNOSIS — J90 Pleural effusion, not elsewhere classified: Secondary | ICD-10-CM

## 2018-05-07 DIAGNOSIS — Z930 Tracheostomy status: Secondary | ICD-10-CM

## 2018-05-07 LAB — CHLAMYDIA AND NEISSERIA DNA BY PCR
CHLAMYDIA TRACHOMATIS PCR: NOT DETECTED
CHLAMYDIA TRACHOMATIS PCR: NOT DETECTED
NEISSERIA GONORRHOEAE PCR: NOT DETECTED
NEISSERIA GONORRHOEAE PCR: NOT DETECTED
NEISSERIA GONORRHOEAE PCR: NOT DETECTED

## 2018-05-07 LAB — BASIC METABOLIC PANEL
ANION GAP: 7 mmol/L (ref 4–13)
ANION GAP: 7 mmol/L (ref 4–13)
BUN/CREA RATIO: 18 (ref 6–22)
BUN: 9 mg/dL (ref 8–25)
BUN: 9 mg/dL (ref 8–25)
CALCIUM: 7.5 mg/dL — ABNORMAL LOW (ref 8.5–10.2)
CHLORIDE: 102 mmol/L (ref 96–111)
CO2 TOTAL: 27 mmol/L (ref 22–32)
CREATININE: 0.49 mg/dL — ABNORMAL LOW (ref 0.62–1.27)
ESTIMATED GFR: >60 mL/min/1.73mˆ2 (ref >60)
GLUCOSE: 151 mg/dL — ABNORMAL HIGH (ref 65–139)
POTASSIUM: 3.9 mmol/L (ref 3.5–5.1)
POTASSIUM: 3.9 mmol/L (ref 3.5–5.1)
SODIUM: 136 mmol/L (ref 136–145)

## 2018-05-07 LAB — CBC
HCT: 24 % — ABNORMAL LOW (ref 38.9–52.0)
HGB: 7.2 g/dL — ABNORMAL LOW (ref 13.4–17.5)
MCH: 26.8 pg (ref 26.0–32.0)
MCHC: 30 g/dL — ABNORMAL LOW (ref 31.0–35.5)
MCV: 89.2 fL (ref 78.0–100.0)
MPV: 12 fL (ref 8.7–12.5)
PLATELETS: 269 x10ˆ3/uL (ref 150–400)
RBC: 2.69 x10ˆ6/uL — ABNORMAL LOW (ref 4.50–6.10)
RDW-CV: 22.1 % — ABNORMAL HIGH (ref 11.5–15.5)
WBC: 8.5 x10ˆ3/uL (ref 3.7–11.0)

## 2018-05-07 LAB — POC BLOOD GLUCOSE (RESULTS)
GLUCOSE, POC: 161 mg/dL — ABNORMAL HIGH (ref 70–105)
GLUCOSE, POC: 111 mg/dL — ABNORMAL HIGH (ref 70–105)
GLUCOSE, POC: 113 mg/dL — ABNORMAL HIGH (ref 70–105)
GLUCOSE, POC: 157 mg/dL — ABNORMAL HIGH (ref 70–105)

## 2018-05-07 LAB — MAGNESIUM: MAGNESIUM: 1.9 mg/dL (ref 1.6–2.6)

## 2018-05-07 LAB — NEISSERIA GONORRHOEAE DNA BY PCR: NEISSERIA GONORRHOEAE PCR: NOT DETECTED

## 2018-05-07 LAB — PHOSPHORUS: PHOSPHORUS: 3.1 mg/dL (ref 2.4–4.7)

## 2018-05-07 MED ORDER — SODIUM CHLORIDE 7 % FOR NEBULIZATION
4.00 mL | INHALATION_SOLUTION | Freq: Three times a day (TID) | RESPIRATORY_TRACT | Status: DC
Start: 2018-05-07 — End: 2018-05-09
  Administered 2018-05-07 – 2018-05-09 (×5): 4 mL via RESPIRATORY_TRACT
  Filled 2018-05-07 (×5): qty 4

## 2018-05-07 MED ORDER — FUROSEMIDE 10 MG/ML INJECTION SOLUTION
20.0000 mg | Freq: Once | INTRAMUSCULAR | Status: AC
Start: 2018-05-07 — End: 2018-05-07
  Administered 2018-05-07: 11:00:00 20 mg via INTRAVENOUS
  Filled 2018-05-07: qty 2

## 2018-05-07 MED ORDER — LINEZOLID IN 5% DEXTROSE IN WATER 600 MG/300 ML INTRAVENOUS PIGGYBACK
600.0000 mg | INJECTION | Freq: Two times a day (BID) | INTRAVENOUS | Status: DC
Start: 2018-05-07 — End: 2018-05-08
  Administered 2018-05-07 (×2): 600 mg via INTRAVENOUS
  Administered 2018-05-07 (×2): 0 mg via INTRAVENOUS
  Administered 2018-05-08: 600 mg via INTRAVENOUS
  Administered 2018-05-08: 0 mg via INTRAVENOUS
  Filled 2018-05-07 (×3): qty 300

## 2018-05-07 MED ORDER — ALBUTEROL SULFATE CONCENTRATE 2.5 MG/0.5 ML SOLUTION FOR NEBULIZATION
2.50 mg | INHALATION_SOLUTION | Freq: Three times a day (TID) | RESPIRATORY_TRACT | Status: DC
Start: 2018-05-07 — End: 2018-05-09
  Administered 2018-05-07 – 2018-05-09 (×5): 2.5 mg via RESPIRATORY_TRACT
  Filled 2018-05-07 (×5): qty 1

## 2018-05-07 MED ADMIN — linezolid in 5% dextrose in water 600 mg/300 mL intravenous piggyback: INTRAVENOUS | @ 22:00:00

## 2018-05-07 MED ADMIN — chlorhexidine gluconate 0.12 % mouthwash: ORAL | @ 21:00:00

## 2018-05-07 MED ADMIN — insulin regular human 100 unit/mL injection solution: SUBCUTANEOUS | @ 17:00:00

## 2018-05-07 MED ADMIN — propranoloL 10 mg tablet: GASTROSTOMY | @ 21:00:00

## 2018-05-07 MED ADMIN — sodium chloride 0.9 % (flush) injection syringe: @ 06:00:00

## 2018-05-07 MED ADMIN — haloperidol lactate 5 mg/mL injection solution: INTRAVENOUS | @ 02:00:00

## 2018-05-07 MED ADMIN — propranoloL 10 mg tablet: GASTROSTOMY | @ 13:00:00

## 2018-05-07 MED ADMIN — famotidine 20 mg tablet: GASTROSTOMY | @ 21:00:00

## 2018-05-07 MED ADMIN — acetaminophen 325 mg/10.15 mL oral suspension: GASTROSTOMY | @ 02:00:00

## 2018-05-07 MED ADMIN — senna leaf extract 176 mg/5 mL oral syrup: GASTROSTOMY | @ 09:00:00

## 2018-05-07 MED ADMIN — ipratropium 0.5 mg-albuteroL 3 mg (2.5 mg base)/3 mL nebulization soln: RESPIRATORY_TRACT | @ 19:00:00

## 2018-05-07 MED ADMIN — sodium chloride 0.9 % (flush) injection syringe: GASTROSTOMY | @ 09:00:00

## 2018-05-07 MED ADMIN — sodium chloride 0.9 % (flush) injection syringe: @ 22:00:00

## 2018-05-07 MED ADMIN — proparacaine 0.5 % eye drops: RESPIRATORY_TRACT

## 2018-05-07 MED ADMIN — HYDROcodone 5 mg-acetaminophen 325 mg tablet: SUBCUTANEOUS | @ 01:00:00

## 2018-05-07 MED ADMIN — sodium chloride 0.9 % (flush) injection syringe: GASTROSTOMY | @ 13:00:00

## 2018-05-07 MED ADMIN — PREMIXED HEMODIALYSATE W/ ADDITIVES: GASTROSTOMY | @ 13:00:00

## 2018-05-07 MED ADMIN — morphine 2 mg/mL intravenous syringe: GASTROSTOMY | @ 09:00:00

## 2018-05-07 MED ADMIN — sodium chloride 0.9 % intravenous solution: GASTROSTOMY | @ 21:00:00

## 2018-05-07 MED ADMIN — sodium chloride 0.9 % (flush) injection syringe: ORAL | @ 09:00:00

## 2018-05-07 NOTE — Care Plan (Signed)
Pt on atc 30% will rest on vent tonight

## 2018-05-07 NOTE — Progress Notes (Signed)
[facility]                                               SICU PROGRESS NOTE    Mathayus, Stanbery  Date of Admission:  04/28/2018  Date of Service: 05/07/2018  Date of Birth:  1960/12/30    Primary Attending:  Kasandra Knudsen  Primary Service:  Trauma Blue    Post Op Day:4 Days Post-Op S/P Procedure(s) (LRB):  TRACHEOSTOMY (N/A)  GASTROSCOPY WITH PLACEMENT PEG TUBE (N/A)     LOS: 9 days      Subjective:    Patient resting up in chair. Did well with ATC trials yesterday.     Vital Signs:  Temp (24hrs) Max:38.2 C (100.8 F)      Systolic (24hrs), Avg:114 , Min:86 , Max:146     Diastolic (24hrs), Avg:75, Min:49, Max:103    Temp  Avg: 38 C (100.4 F)  Min: 37.7 C (99.9 F)  Max: 38.2 C (100.8 F)  MAP (Non-Invasive)  Avg: 86.7 mmHG  Min: 60 mmHG  Max: 111 mmHG  Pulse  Avg: 102  Min: 94  Max: 110  Resp  Avg: 28.7  Min: 22  Max: 38  SpO2  Avg: 99.6 %  Min: 96 %  Max: 100 %  Pain Score (Numeric, Faces): Other    Meds  No current outpatient medications on file.     acetaminophen (TYLENOL) 160 mg per 5 mL liquid - grape flavored, 650 mg, Gastric (NG, OG, PEG, GT), Q4H PRN  aspirin chewable tablet 81 mg, 81 mg, Gastric (NG, OG, PEG, GT), Daily  chlorhexidine gluconate (PERIDEX) 0.12% mouthwash, 15 mL, Swish & Spit, 2x/day  enoxaparin PF (LOVENOX) 60 mg/0.6 mL SubQ injection, 0.5 mg/kg, Subcutaneous, Q12H  famotidine (PEPCID) tablet, 20 mg, Gastric (NG, OG, PEG, GT), 2x/day  furosemide (LASIX) 10 mg/mL injection, 20 mg, Intravenous, Once  gabapentin (NEURONTIN)  per 6mL oral liquid, 300 mg, Gastric (NG, OG, PEG, GT), 3x/day  haloperidol (HALDOL) 5 mg/mL injection, 5 mg, Intravenous, Q6H PRN  ipratropium-albuterol 0.5 mg-3 mg(2.5 mg base)/3 mL Solution for Nebulization, 3 mL, Nebulization, Q6H  lanolin-oxyquin-pet, hydrophil (BAG BALM) topical ointment, , Apply Topically, 2x/day PRN  melatonin tablet, 6 mg, Gastric (NG, OG, PEG, GT), NIGHTLY  NS flush syringe, 2 mL, Intracatheter, Q8HRS    And  NS  flush syringe, 2-6 mL, Intracatheter, Q1 MIN PRN  nutrition protein supplement 15 g per 30 mL packet, 2 Packet, Gastric (NG, OG, PEG, GT), Daily  polyethylene glycol (MIRALAX) oral packet, 17 g, Gastric (NG, OG, PEG, GT), Daily  prenatal vitamin-iron-folic acid tablet, 1 Tab, Gastric (NG, OG, PEG, GT), Daily  propranolol (INDERAL) tablet, 10 mg, Gastric (NG, OG, PEG, GT), 3x/day  senna concentrate (SENNA)  per 15mL oral liquid, 5 mL, Gastric (NG, OG, PEG, GT), 2x/day  SSIP insulin R human (HUMULIN R) 100 units/mL injection, 0-12 Units, Subcutaneous, Q4H PRN        Physical Exam:  JXB:JYNWGNFAOZH ill, frail male appears older than stated age   HEENT:Pupils 6mm bilaterally,large posterior scalp hematoma  NECK:Trachea midline,, tracheostomy in place  PULM:Diminished breath sounds bilaterally, course, Tracheostomy in place   CARDIAC:Afib   CHEST::No abrasions or contusions  YQM:VHQIONG soft, and nondistended  PELVIS:No evidence of injury  GU/RECTAL:large scrotal edema resolving   BACK:No evidence of injury and no step-off  VW:UJWJXB noted on bilateral knees and feet  NEURO:Intubated, following commands  GLASGOW COMA SCALE:Eye opening:4 spontaneous, Verbal response:Trachostomy , Best motor response:6 obeys commands  VASCULAR:All pulses palpable and equal bilaterally  INTEGUMENTARY:Pink, warm, and dry andanasarca    Labs:  I have reviewed all lab results.    Recent Imaging:  Results for orders placed or performed during the hospital encounter of 04/28/18 (from the past 72 hour(s))   XR AP MOBILE CHEST     Status: None    Narrative    [name]  Male, 58 years old.    XR AP MOBILE CHEST performed on 05/05/2018 7:39 AM.    REASON FOR EXAM:  tracheostomy    TECHNIQUE: 1 views/1 images submitted for interpretation.    COMPARISON:  05/04/2018    FINDINGS:  The heart is enlarged. There is no focal consolidation, pleural  effusion, or pneumothorax. Tracheostomy is unchanged.       Impression    Stable cardiomegaly with no new abnormality seen.     XR AP MOBILE CHEST     Status: None    Narrative    ROBERTT BUDA  Male, 58 years old.    XR AP MOBILE CHEST performed on 05/05/2018 9:25 PM.    REASON FOR EXAM:  New onset fever    TECHNIQUE: 1 view/2 image(s) submitted for interpretation.    COMPARISON: 05/05/2018.    FINDINGS:      LUNGS: No focal consolidation.    HEART AND VASCULATURE: Persistent cardiomegaly.    PLEURA: No pleural effusion is present. No pneumothorax is demonstrated.    SUPPORT DEVICES:  Stable tracheostomy tube.      Impression    Stable cardiomegaly without new focal consolidation.     XR AP MOBILE CHEST     Status: None    Narrative    JAHSON EMANUELE  Male, 58 years old.    XR AP MOBILE CHEST performed on 05/06/2018 6:08 AM.    REASON FOR EXAM:  tracheostomy    TECHNIQUE: 1 view/1 image(s) submitted for interpretation.    COMPARISON: 05/05/2018    FINDINGS:      LUNGS: No focal consolidation.    HEART AND VASCULATURE: Stable heart size.    PLEURA: No pleural effusion is present. No pneumothorax is demonstrated.      Impression    Stable aeration without new focal consolidation.     XR AP MOBILE CHEST     Status: None    Narrative    CAYLOR TALLARICO  Male, 58 years old.    XR AP MOBILE CHEST performed on 05/07/2018 4:20 AM.    REASON FOR EXAM:  tracheostomy    TECHNIQUE: 1 view/1 image(s) submitted for interpretation.    COMPARISON: 05/06/2018    FINDINGS:      LUNGS: Bibasilar airspace opacities.    HEART AND VASCULATURE: Persistent cardiomegaly.    PLEURA: Small bilateral effusions. No pneumothorax is demonstrated.    SUPPORT DEVICES:  Stable tracheostomy tube.      Impression    Persistent cardiomegaly, small effusions and bibasilar airspace opacities.         Assessment/ Plan:  Active Hospital Problems    Diagnosis   . Primary Problem: Fall   . COPD (chronic obstructive pulmonary disease) (CMS HCC)   . Thrombocytopenia Unspecified   . Hypokalemia   . Alcohol abuse   . Troponin I  above reference range   . Acute respiratory failure (CMS HCC)   .  Nasal fracture   . Abrasions of multiple sites   . Scalp hematoma   . Humeral fracture   . UTI (urinary tract infection)   . Left inguinal hernia   . Anasarca       KORRIE STALCUP is a 58 y.o. male who is 4 Days Post-Op S/P Procedure(s) (LRB):  TRACHEOSTOMY (N/A)  GASTROSCOPY WITH PLACEMENT PEG TUBE (N/A)      NEURO:  Sz prophylaxis: None  Melatonin  Prenatal vitamin  Sedation/analgesia: gabapentin, tylenol, haldol PRN    CARDIOVASCULAR:  Systolic (24hrs), Avg:114 , Min:86 , Max:146     Diastolic (24hrs), Avg:75, Min:49, Max:103     ART-Line  MAP: 85 mmHg   Troponins: No results found for: CKMB   Meds: aspirin, propanolol  Pressors: None        PULMONARY:  Airway Ventilator Settings   Tracheostomy 8 Cuffed (Active)   Trach Status Secured 05/06/2018  4:00 AM   Site Condition Intact 05/06/2018  4:00 AM   Trach Care Site Cleaned;Inner Cannula Changed;Dressing Changed 05/06/2018  4:00 AM   Trach Supplies @ Bedside? Yes 05/06/2018  4:00 AM    Ventilator Settings:  Mode: SPONT  Set PEEP: 5 cmH2O  Pressure Support: 10 cmH2O  FiO2: 30 %  I:E Ratio: 1:1.9   SpO2  Avg: 99.6 %  Min: 96 %  Max: 100 %  Blood Gas: None  No results found for this encounter  Nebs: duoneb  Imaging: CXR shows tracheostomy tube in appropriate position with no evidence of pneumothorax or acute changes when compared to previous CXR  Patient doing better on ATC trials, was able to tolerate 4 hours this AM    GI:  MNT PROTOCOL FOR DIETITIAN  ADULT TUBE FEEDING - CONTINUOUS DRIP CONTINUOUS NO MEALS, TF ONLY; OSMOLITE 1.5; OG; Initial Rate (ml/hr): 20; Increase by: 10 ml every 6 hours; Goal Rate (ml/hr): 60  DIET NPO - NOW EXCEPT TUBE FEEDS   Recent Labs     05/05/18  2221   BILIRUBIN Negative   Protein for nutrition   Last BM: Last Bowel Movement: 05/07/18  Proph:  Pepcid, Miralax, senna     RENAL/GU:  Recent Labs     05/04/18  1849 05/05/18  0140 05/05/18  2221 05/06/18  0112 05/07/18  0118      SODIUM  --  136  --  138 136   POTASSIUM  --  3.4*  --  3.4* 3.9   CHLORIDE  --  105  --  104 102   BICARBONATE 26.1* 28.2*  --   --   --    BUN  --  8  --  9 9   CREATININE  --  0.47*  --  0.51* 0.49*   GLUCOSE  --   --  Negative  --   --    ANIONGAP  --  4  --  6 7   CALCIUM  --  7.4*  --  7.3* 7.5*   MAGNESIUM  --  2.0  --  2.0 1.9   PHOSPHORUS  --  2.5  --  2.7 3.1       I/O last 24 hours:      Intake/Output Summary (Last 24 hours) at 05/07/2018 1011  Last data filed at 05/07/2018 0700  Gross per 24 hour   Intake 1280 ml   Output 1430 ml   Net -150 ml     I/O current shift:  03/22 0700 - 03/22 1859  In: 60 [GT:60]  Out: 40 [Urine:40]   Urine output- 35-50 cc/hr since midnight, 0.35-0.54 cc/hr  Foley in critically ill pt for strict I/O's    HEME:  Recent Labs     05/05/18  0140 05/06/18  0112 05/07/18  0118   HGB 7.3* 7.3* 7.2*   HCT 24.4* 24.2* 24.0*   PLTCNT 172 224 269     Transfusions: None  Proph: SCD's, Lovenox     ID:  Temp (24hrs) Max:38.2 C (100.8 F)    Recent Labs     05/05/18  0140 05/06/18  0112 05/07/18  0118   WBC 5.8 8.2 8.5     G+C re-ordered  MRSA swab sent  Cultures: pending  ABX: None    ENDO:  SSI    MSK:  No skin breakdown    OTHER:  Lines:  Peripheral IV x3, foley, G-tube, tracheostomy   Activity: Bedrest, HOB 30deg  PT/OT:  ordered  MNT:  ordered    PLAN:  F/U cultures  Continue to wean ventilatory support  Diuresis         Carlynn Herald, MD 05/07/2018, 10:11     ______________________________________________________________________________  SICU Attending    I saw and examined the patient.  I reviewed the resident's note.  I agree with the findings and plan of care as documented in the resident's note.  Any exceptions/additions are edited/noted.    Respiratory failure- continue to stretch the amount of time he does ATC. Rest on the vent overnight.   I have seen and reviewed Radiology Images CXR.  My interpretation is: right sided effusion. 20 mg IV lasix today  Was febrile, follow  cultures     Critical Care Attestation    I was present at the bedside of this critically ill patient for 37 minutes exclusive of procedures.  This patient suffers from failure or dysfunction of Neurologic/Sensory, Pulmonary, Renal and Immue/Allergic system(s).  The care of this patient was in regard to managing (a) conditions(s) that has a high probability of sudden, clinically significant, or life-threatening deterioration and required a high degree of Attending Physician attention and direct involvement to intervene urgently. Data review and care planning was performed in direct proximity of the patient, examination was obviously performed in direct contact with the patient. All of this time was exclusive of procedure which will be documented elsewhere in the chart.    My critical care time is independent and unique to other providers (no other providers saw patient for purposes of sicu evaluation)  My critical care time involved full attention to the patients' condition and included:    Review of nursing notes and/or old charts  Review of medications, allergies, and vital signs  Documentation time  Consultant collaboration on findings and treatment options  Care, transfer of care, and discharge plans  Ordering, interpreting, and reviewing diagnostic studies/tab tests  Obtaining necessary history from family, EMS, nursing staff and/or treating physicians    My critical care time did not include time spent teaching resident physician(s) or other services of resident physicians, or performing other reported procedures.  Total Critical Care Time: 37 minutes    Babs Sciara, MD

## 2018-05-07 NOTE — Respiratory Therapy (Signed)
Pt on atc 30% vent qhs suctioned as needed

## 2018-05-07 NOTE — Care Plan (Signed)
Antonio Gillespie  Rehabilitation Services  Physical Therapy Initial Evaluation    Patient Name: Antonio Gillespie  Date of Birth: 09-08-1960  Height: Height: 172.7 cm (5\' 8" )  Weight: Weight: 92.7 kg (204 lb 5.9 oz)  Room/Bed: 04/A  Payor: [employer] PENDING [employer]ICAID / Plan: ELIGIBILITY PENDING MEDICAID / Product Type: Medicaid /     Assessment:      Antonio Gillespie is a 58 y.o. male with PMH of atrial fibrillation, COPD, EtOH abuse who was transferred from outside facility. Per chart review, family of patient had not heard from him and called the police who found him in his home lying face down and covered in urine/feces. Patient was intubated at outside facility ED. Pan CT scan revealed a comminuted right humeral neck fracture, pulmonary artery enlargement, large left inguinal hernia. CT head showed bilateral scalp hematomas. Pt unable to provide information regarding home set-up and PLOF. Per chart, pt lives alone and was IND prior to admission. At time of evaluation, pt is able to complete AAROM to BLE x10 LAD, ankle DF. Pt alert but unable to communciate during treatment session; does not indicate pain throughout session. Current impairments inlcude decreased cognition/mentation, strength, endurance, coordination, motor control, balance impairments, and need for assistance to safely complete all functional tasks at this time. PT to recommend SNF d/t functional activity tolerance and deficits. Will continue to follow and update d/c recommendations as needed.    Discharge Needs:   Equipment Recommendation: TBD  Discharge Disposition: skilled nursing facility    JUSTIFICATION OF DISCHARGE RECOMMENDATION   Based on current diagnosis, functional performance prior to admission, and current functional performance, this patient requires continued PT services in skilled nursing facility in order to achieve significant functional improvements in these deficit areas: aerobic capacity/endurance, ergonomics and body mechanics,  gait, locomotion, and balance, integumentary integrity, joint integrity and mobility, motor function, muscle performance, neuromotor development and sensory integration, neuromuscular, posture, ROM (range of motion), ventilation and respiration/gas exchange.    Plan:   Current Intervention: balance training, bed mobility training, gait training, patient/family education, postural re-education, ROM (range of motion), strengthening, stretching, transfer training, motor coordination training  To provide physical therapy services 1x/day, minimum of 2x/week  for duration of until discharge.    The risks/benefits of therapy have been discussed with the patient/caregiver and he/she is in agreement with the established plan of care.       Subjective & Objective        05/07/18 1007   Therapist Pager   PT Assigned/ Pager # Luther Parody [phone]   Rehab Session   Document Type evaluation   Total PT Minutes: 15   Patient Effort fair   General Information   Patient Profile Reviewed yes   Onset of Illness/Injury or Date of Surgery 04/29/18   Pertinent History of Current Functional Problem Antonio Gillespie is a 58 y.o. male with PMH of atrial fibrillation, COPD, EtOH abuse who was transferred from outside facility. Per chart review, family of patient had not heard from him and called the police who found him in his home lying face down and covered in urine/feces. Patient was intubated at outside facility ED. Pan CT scan revealed a comminuted right humeral neck fracture, pulmonary artery enlargement, large left inguinal hernia. CT head showed bilateral scalp hematomas.   Medical Lines PIV Line;Telemetry;Foley Catheter   Respiratory Status aerosol trach collar   Existing Precautions/Restrictions fall precautions;full code;oxygen therapy device and L/min   Mutuality/Individual Preferences   Anxieties, Fears or  Concerns None noted.    Individualized Care Needs OOB with maximove.    Patient-Specific Goals (Include Timeframe) Improve range of  motion.    Living Environment   Lives With alone   Living Arrangements house   Living Environment Comment Per chart, pt lives alone.    Functional Level Prior   Prior Functional Level Comment Pt was previously IND.    Pre Treatment Status   Pre Treatment Patient Status Call light within reach;Telephone within reach;Patient sitting in bedside chair or w/c;Sitter select activated;Nurse approved session   Support Present Pre Treatment  None   Communication Pre Treatment  Nurse   Communication Pre Treatment Comment RN medically cleared pt for participation in therapy interventio.   Cognitive Assessment/Interventions   Behavior/Mood Observations alert;unable/difficult to assess   Attention severe impairment;needs re-direction;difficulty attending to task/directions;difficult dividing attention;needs cues to re-direct   Follows Commands follows one step commands   Comment Pt unable to communication during evaluation. Follows one step commands appropriately.    Vital Signs   Vitals Comment VSS throughout session.    Pain Assessment   Pre/Posttreatment Pain Comment No indication of pain throughout session.    RLE Assessment   RLE Assessment X-Exceptions   RLE ROM decreased   RLE Strength decreased   LLE Assessment   LLE Assessment X-Exceptions   LLE ROM decreased   LLE Strength decreased   Balance Skill Training   Sitting Balance: Static fair - balance   Sitting, Dynamic (Balance) unable to balance   Sit-to-Stand Balance unable to balance   Standing Balance: Static unable to balance   Standing Balance: Dynamic unable to balance   Functional Endurance Training   Comment, Functional Endurance Poor   Post Treatment Status   Post Treatment Patient Status Patient sitting in bedside chair or w/c;Call light within reach;Telephone within reach;Sitter select activated   Support Present Programmer, systems Post Treatment Comment Pt performance with PT.    Plan of Care Review      Plan Of Care Reviewed With patient   Basic Mobility Am-PAC/6Clicks Score   Turning in bed without bedrails 1   Lying on back to sitting on edge of flat bed 1   Moving to and from a bed to a chair 1   Standing up from chair 1   Walk in room 1   Climbing 3-5 steps with railing 1   6 Clicks Raw Score total 6   Standardized (t-scale) score 16.59   CMS 0-100% Score 100   CMS Modifier CN   Patient Mobility Goal (JHHLM) 2- Perform PROM 2X/day   Exercise/Activity Level Performed 2- Turned self in bed/ROM (active or passive)   Physical Therapy Clinical Impression   Assessment Antonio Gillespie is a 58 y.o. male with PMH of atrial fibrillation, COPD, EtOH abuse who was transferred from outside facility. Per chart review, family of patient had not heard from him and called the police who found him in his home lying face down and covered in urine/feces. Patient was intubated at outside facility ED. Pan CT scan revealed a comminuted right humeral neck fracture, pulmonary artery enlargement, large left inguinal hernia. CT head showed bilateral scalp hematomas. Pt unable to provide information regarding home set-up and PLOF. Per chart, pt lives alone and was IND prior to admission. At time of evaluation, pt is able to complete AAROM to BLE x10 LAD, ankle DF. Pt alert but unable to communciate during treatment  session; does not indicate pain throughout session. Current impairments inlcude decreased cognition/mentation, strength, endurance, coordination, motor control, balance impairments, and need for assistance to safely complete all functional tasks at this time. PT to recommend SNF d/t functional activity tolerance and deficits. Will continue to follow and update d/c recommendations as needed.   Criteria for Skilled Therapeutic yes;meets criteria;skilled treatment is necessary   Pathology/Pathophysiology Noted musculoskeletal;neuromuscular;cardiovascular;pulmonary;integumentary   Impairments Found (describe specific impairments)  aerobic capacity/endurance;ergonomics and body mechanics;gait, locomotion, and balance;integumentary integrity;joint integrity and mobility;motor function;muscle performance;neuromotor development and sensory integration;neuromuscular;posture;ROM (range of motion);ventilation and respiration/gas exchange   Functional Limitations in Following  self-care;home management;community/leisure   Disability: Inability to Perform community/leisure   Rehab Potential fair, will monitor progress closely   Therapy Frequency 1x/day;minimum of 2x/week   Predicted Duration of Therapy Intervention (days/wks) until discharge   Anticipated Equipment Needs at Discharge (PT) TBD   Anticipated Discharge Disposition skilled nursing facility   Evaluation Complexity Justification   Patient History: Co-morbidity/factors that impact Plan of Care 3 or more that impact Plan of Care;One or more other medical co-morbidity;Social support barriers/factors;Behavioral patterns;Age;Healthcare literacy   Examination Components 4 or more Exam elements addressed;Vital signs (BP, HR, RR, or O2 saturation);Range of motion;Strength;Balance;Bed mobility   Presentation Unstable: Characteristics of condition unpredictable &/or significant cognitive deficits affect safety   Clinical Decision Making High complexity   Evaluation Complexity High complexity   Care Plan Goals   PT Rehab Goals Bed Mobility Goal;Transfer Training Goal   Bed Mobility Goal   Bed Mobility Goal, Date Established 05/07/18   Bed Mobility Goal, Time to Achieve by discharge   Bed Mobility Goal, Activity Type roll left/roll right;supine to sit/sit to supine   Bed Mobility Goal, Independence Level moderate assist (50% patient effort);verbal cues required   Transfer Training Goal   Transfer Training Goal, Date Established 05/07/18   Transfer Training Goal, Time to Achieve by discharge   Transfer Training Goal, Activity Type bed-to-chair/chair-to-bed;sit-to-stand/stand-to-sit   Transfer Training  Goal, Independence Level moderate assist (50% patient effort);verbal cues required   Planned Therapy Interventions, PT Eval   Planned Therapy Interventions (PT) balance training;bed mobility training;gait training;patient/family education;postural re-education;ROM (range of motion);strengthening;stretching;transfer training;motor coordination training       Therapist:   Hyacinth Meekeraitlin Michaeleen Down, PT, DPT   Pager #: [phone]

## 2018-05-07 NOTE — Progress Notes (Signed)
[facility]                                                      Trauma Progress Note                 Date of Birth:  05-21-1960  Date of Admission:  04/28/2018  Date of service: 05/07/2018    Antonio Gillespie, 58 y.o., male Post trauma day 9 status post Fall      Events over the last 24 hours have included:  Tolerated ATC 4 on, 4 off yesterday.    Subjective:    Patient unable to effectively communicate.      Objective   24 Hour Summary:    Filed Vitals:    05/07/18 0343 05/07/18 0400 05/07/18 0500 05/07/18 0703   BP:  103/73 105/64 103/61   Pulse:  (!) 103 (!) 103 (!) 103   Resp:  (!) 30 (!) 27 (!) 31   Temp:  (!) 38.1 C (100.6 F)     SpO2: 100% 100% 100% 98%     Labs:  Recent Labs     05/04/18  1849 05/05/18  0140 05/05/18  2221 05/06/18  0112 05/07/18  0118   WBC  --  5.8  --  8.2 8.5   HGB  --  7.3*  --  7.3* 7.2*   HCT  --  24.4*  --  24.2* 24.0*   SODIUM  --  136  --  138 136   POTASSIUM  --  3.4*  --  3.4* 3.9   CHLORIDE  --  105  --  104 102   BICARBONATE 26.1* 28.2*  --   --   --    BUN  --  8  --  9 9   CREATININE  --  0.47*  --  0.51* 0.49*   GLUCOSE  --   --  Negative  --   --    ANIONGAP  --  4  --  6 7   CALCIUM  --  7.4*  --  7.3* 7.5*   MAGNESIUM  --  2.0  --  2.0 1.9   PHOSPHORUS  --  2.5  --  2.7 3.1       Intake/Output Summary (Last 24 hours) at 05/07/2018 0708  Last data filed at 05/07/2018 0700  Gross per 24 hour   Intake 1460 ml   Output 1780 ml   Net -320 ml     Nutrition Management: MNT PROTOCOL FOR DIETITIAN  ADULT TUBE FEEDING - CONTINUOUS DRIP CONTINUOUS NO MEALS, TF ONLY; OSMOLITE 1.5; OG; Initial Rate (ml/hr): 20; Increase by: 10 ml every 6 hours; Goal Rate (ml/hr): 60  DIET NPO - NOW EXCEPT TUBE FEEDS Last Bowel Movement: 05/07/18  No results for input(s): ALBUMIN, PREALBUMIN in the last 72 hours.  Current Medications:    Current Facility-Administered Medications:   .  acetaminophen (TYLENOL) 160 mg per 5 mL liquid - grape flavored, 650 mg, Gastric (NG, OG, PEG, GT), Q4H PRN,  Thimm, Terra, MD, 650 mg at 05/07/18 0136  .  aspirin chewable tablet 81 mg, 81 mg, Gastric (NG, OG, PEG, GT), Daily, McCoy, Skyler, MD, 81 mg at 05/06/18 1005  .  chlorhexidine gluconate (PERIDEX) 0.12% mouthwash, 15 mL, Swish & Spit, 2x/day, Harvin Hazel,  Anderson Malta, MD, 15 mL at 05/06/18 2135  .  enoxaparin PF (LOVENOX) 60 mg/0.6 mL SubQ injection, 0.5 mg/kg, Subcutaneous, Q12H, Ann Held, MD, 50 mg at 05/06/18 2132  .  famotidine (PEPCID) tablet, 20 mg, Gastric (NG, OG, PEG, GT), 2x/day, Harvin Hazel, Anderson Malta, MD, 20 mg at 05/06/18 2135  .  gabapentin (NEURONTIN) 376m per 655moral liquid, 300 mg, Gastric (NG, OG, PEG, GT), 3x/day, Bardes, Marteze, MD, 300 mg at 05/06/18 2135  .  haloperidol (HALDOL) 5 mg/mL injection, 5 mg, Intravenous, Q6H PRN, BoVerlon AuMD, 5 mg at 05/07/18 0135  .  ipratropium-albuterol 0.5 mg-3 mg(2.5 mg base)/3 mL Solution for Nebulization, 3 mL, Nebulization, Q6H, McCoy, Skyler, MD, 3 mL at 05/07/18 0200  .  lanolin-oxyquin-pet, hydrophil (BAG BALM) topical ointment, , Apply Topically, 2x/day PRN, BaMarquette OldMD  .  melatonin tablet, 6 mg, Gastric (NG, OG, PEG, GT), NIGHTLY, Thimm, Terra, MD, 6 mg at 05/06/18 2135  .  INSERT & MAINTAIN PERIPHERAL IV ACCESS, , , UNTIL DISCONTINUED **AND** DRESSING CHANGE Saline Lock; DRY STERILE, , , PRN **AND** NS flush syringe, 2 mL, Intracatheter, Q8HRS, Stopped at 05/07/18 0600 **AND** NS flush syringe, 2-6 mL, Intracatheter, Q1 MIN PRN, ChAgustina CaroliMD  .  nutrition protein supplement 15 g per 30 mL packet, 2 Packet, Gastric (NG, OG, PEG, GT), Daily, BaMarquette OldMD, 30 g at 05/06/18 0900  .  polyethylene glycol (MIRALAX) oral packet, 17 g, Gastric (NG, OG, PEG, GT), Daily, SpDimple NanasMD, Stopped at 05/06/18 0900  .  prenatal vitamin-iron-folic acid tablet, 1 Tab, Gastric (NG, OG, PEG, GT), Daily, BaAnn HeldMD, 1 Tab at 05/06/18 1005  .  propranolol (INDERAL) tablet, 10 mg, Gastric (NG, OG, PEG, GT), 3x/day, McCoy, Skyler,  MD, 10 mg at 05/06/18 2132  .  senna concentrate (SENNA) 52872mer 91m57mal liquid, 5 mL, Gastric (NG, OG, PEG, GT), 2x/day, KnigHarvin HazelnnAnderson Malta, Stopped at 05/05/18 2100  .  SSIP insulin R human (HUMULIN R) 100 units/mL injection, 0-12 Units, Subcutaneous, Q4H PRN, 2 Units at 05/07/18 0123 **AND** POCT WHOLE BLOOD GLUCOSE, , , Q6H, BootVerlon Au     Today's Physical Exam:  GEN:   NAD and in chair, acutely ill  HEENT:   Normocephalic;        NECK:   Trach in place  PULM:   Non-labored respirations  CV:   Mildly tachycardic   ABD:   Abdomen soft, nontender, and nondistended  NEURO:  Unable to communicate   Integumentary:  Pink, warm, and dry        Assessment:  Active Hospital Problems   (*Primary Problem)    Diagnosis   . *Fall     "found down" and taken to outside hospital     . COPD (chronic obstructive pulmonary disease) (CMS HCC)     Chronic   . Thrombocytopenia Unspecified   . Hypokalemia   . Alcohol abuse   . Troponin I above reference range   . Acute respiratory failure (CMS HCC)   . Nasal fracture     Acute vs chronic     . Abrasions of multiple sites   . Scalp hematoma   . Humeral fracture     nonop per ortho     . UTI (urinary tract infection)     At outside hospital. Repeat UA pending     . Left inguinal hernia     S/p reduction     . Anasarca  Plan:  Nutrition: MNT PROTOCOL FOR DIETITIAN  ADULT TUBE FEEDING - CONTINUOUS DRIP CONTINUOUS NO MEALS, TF ONLY; OSMOLITE 1.5; OG; Initial Rate (ml/hr): 20; Increase by: 10 ml every 6 hours; Goal Rate (ml/hr): 60  DIET NPO - NOW EXCEPT TUBE FEEDS diet, Last Bowel Movement: 05/07/18  Activity: As tolerated    -Continue progressing to ATC  -Lovenox for DVT ppx  -Dispo - will need SNF when medically ready and off vent for 24 hours      Doyne Keel, MD    Medical Decision Making component commentary:  Neuro:Acute pain secondary to trauma/surgery: Continue multimodal pain therapy with limitation of narcotics. More awake today.   On propranolol  Sleep  hygeine and melatonin.  Alcohol history- thiamine and folate  Resp: Continue pulmonary hygiene/IS  S/p trach, Ac resp failure-vent per SICU. Cont ATC wean  CV: Hemodynamically acceptable. Cont to monitor. Afib continues.  Ensure hold parameters on antihypertensives to prevent hypotension  OI:ZTIW: NPO Tube feeds   GI Prophylaxis: Pepcid  Bowel Reg-continueLast Bowel Movement: 05/06/18  Endo: ISS for treatment of hyperglycemia.   Renal:Monitor Uo, Lytes-no replacements  Foley-critical I&O  Heme:Ac Blood Loss Anemia-asx On supplemental vitamins  PY:[id number]. G&C reordered. Chlamydia neg  3/20 Blood Cx NGTD  3/21 BAL 1+ Rare Gram Positive Cocci/Clusters  Skin/Musculoskeletal: continue local wound care  DVT Prophylaxis:SCDs, Lovenox  increased risk of DVT formation secondary to trauma/decreased mobility/surgery  Therapies: PT/OT  ActivityNWB RUEXT  Disposition: cont ICU

## 2018-05-07 NOTE — Care Plan (Signed)
[facility]  Occupational Therapy Initial Evaluation    Patient Name: Antonio Gillespie  Date of Birth: [birth year]  Height: Height: 172.7 cm (5' 8" )  Weight: Weight: 92.7 kg (204 lb 5.9 oz)  Room/Bed: 04/A  Payor: [employer] PENDING [employer]ICAID / Plan: ELIGIBILITY PENDING MEDICAID / Product Type: Medicaid /     Assessment:   Pt with poor tolerance of OT consult.  Pt with limited AROM and strength in trunk, B UE, and B LE.  Pt Dep for all mobility, and self care.  Pt unsafe to return home and will required SNF for continued therapy.       Discharge Needs:   Equipment Recommendation: to be determined    Discharge Disposition: skilled nursing facility    JUSTIFICATION OF DISCHARGE RECOMMENDATION   Based on current diagnosis, functional performance prior to admission, and current functional performance, this patient requires continued OT services in skilled nursing facility  in order to achieve significant functional improvements.    Plan:   Current Intervention: ADL retraining, balance training, bed mobility training, endurance training, transfer training, strengthening, ROM (range of motion), fine motor coordination training, joint mobilization, motor coordination training    To provide Occupational therapy services 1x/day, minimum of 2x/week, until discharge, until goals are met.       The risks/benefits of therapy have been discussed with the patient/caregiver and he/she is in agreement with the established plan of care.       Subjective & Objective        05/07/18 1008   Therapist Pager   OT Assigned/ Pager # Danae Chen [phone]   Rehab Session   Document Type evaluation   Total OT Minutes: 15   Patient Effort fair   General Information   Patient Profile Reviewed yes   Onset of Illness/Injury or Date of Surgery 04/29/18   Pertinent History of Current Functional Problem Antonio Gillespie is a 58 y.o. male with PMH of atrial fibrillation, COPD, EtOH abuse who was transferred from outside facility. Per  chart review, family of patient had not heard from him and called the police who found him in his home lying face down and covered in urine/feces. Patient was intubated at outside facility ED. Pan CT scan revealed a comminuted right humeral neck fracture, pulmonary artery enlargement, large left inguinal hernia. CT head showed bilateral scalp hematomas.   Medical Lines PIV Line;Telemetry;Foley Catheter   Respiratory Status aerosol trach collar   Existing Precautions/Restrictions fall precautions;full code;oxygen therapy device and L/min   Pre Treatment Status   Pre Treatment Patient Status Call light within reach;Telephone within reach;Patient sitting in bedside chair or w/c;Sitter select activated;Nurse approved session   Support Present Pre Treatment  None   Communication Pre Treatment  Nurse   Communication Pre Treatment Comment Pt and nursing agreeable to OT consult with professional assist of PT.    Mutuality/Individual Preferences   Individualized Care Needs OOB with maximove    Living Environment   Lives With alone   Living Arrangements house   Living Environment Comment Per chart, patient lives alone    Functional Level Prior   Prior Functional Level Comment IND prior.    Vital Signs   Vitals Comment VSS t/o session.    Pain Assessment   Pre/Posttreatment Pain Comment No indication of pain throughout session.    Coping/Psychosocial Response Interventions   Plan Of Care Reviewed With patient   Cognitive Assessment/Interventions   Behavior/Mood Observations alert;unable/difficult to assess   Attention  severe impairment;needs re-direction;difficulty attending to task/directions;difficult dividing attention;needs cues to re-direct   Follows Commands follows one step commands   Comment Pt unable to communication during evaluation. Follows one step commands appropriately.   RUE Assessment   RUE Assessment X- Exceptions   RUE ROM elbow, wrist and finger PROM WFL.  Pt with minimal AROM and with pitting edema.  Shoulder NT due to fx.    RUE Strength 1/5   LUE Assessment   LUE Assessment X-Exceptions   LUE ROM PROM WFL.  AROM minimal.  Pitting edema.    LUE Strength 2/5   Musculoskeletal   General Mobility generalized weakness   Weight-Bearing Status RUE   RUE Weight-Bearing Status nonweight bearing   Mobility Assessment/Training   Mobility Comment Pt assisted in forward sitting in bedside chair.  Pt required assist to maintain sitting balance due to decreased core strength, and limited UE AROM/strength.    ADL Assessment/Intervention   ADL Comments Dep for all Self Care   Balance Skill Training   Sitting Balance: Static fair - balance   Sitting, Dynamic (Balance) unable to balance   Sit-to-Stand Balance unable to balance   Standing Balance: Static unable to balance   Standing Balance: Dynamic unable to balance   Functional Endurance Training   Comment, Functional Endurance Poor   Post Treatment Status   Post Treatment Patient Status Patient sitting in bedside chair or w/c;Call light within reach;Telephone within reach;Engineer, petroleum Nurse   Care Plan Goals   OT Rehab Goals Bed Mobility Goal;Transfer Training Goal;Toileting Goal;LB Dressing Goal;Grooming Goal;Occupational Therapy Goal   Occupational Therapy Goals   OT Goal, Date Established 05/07/18   OT Goal, Time to Achieve by discharge   OT Goal, Activity Type Pt will increase B UE strength to 4/5 in prep for increased functional use of B UE in ADLs and transfers.    Bed Mobility Goal   Bed Mobility Goal, Date Established 05/07/18   Bed Mobility Goal, Time to Achieve by discharge   Bed Mobility Goal, Activity Type all bed mobility activities   Bed Mobility Goal, Independence Level moderate assist (50% patient effort)   Grooming Goal   Grooming Goal, Date Established 05/07/18   Grooming Goal, Time to Achieve by discharge   Grooming Goal, Activity Type all grooming tasks   Grooming Goal,  Independence  minimum assist (75% patient effort)   LB Dressing Goal   LB Dressing Goal, Date Established 05/07/18   LB Dressing Goal, Time to Achieve by discharge   LB Dressing Goal, Activity Type all lower body dressing tasks   LB Dressing Goal, Independence Level moderate assist (50% patient effort)   Toileting Goal   Toileting Goal, Date Established 05/07/18   Toileting Goal, Time to Achieve by discharge   Toileting Goal, Activity Type all toileting tasks   Toileting Goal, Independence Level moderate assist (50% patient effort)   Transfer Training Goal   Transfer Training Goal, Date Established 05/07/18   Transfer Training Goal, Time to Achieve by discharge   Transfer Training Goal, Activity Type all transfers   Transfer Training Goal, Independence Level moderate assist (50% patient effort)   Planned Therapy Interventions, OT Eval   Planned Therapy Interventions ADL retraining;balance training;bed mobility training;endurance training;transfer training;strengthening;ROM (range of motion);fine motor coordination training;joint mobilization;motor coordination training   Clinical Impression   Functional Level at Time of Session Pt with poor tolerance of OT consult.  Pt  with limited AROM and strength in trunk, B UE, and B LE.  Pt Dep for all mobility, and self care.  Pt unsafe to return home and will required SNF for continued therapy.    Criteria for Skilled Therapeutic Interventions Met (OT) yes   Rehab Potential fair, will monitor progress closely   Therapy Frequency 1x/day;minimum of 2x/week   Predicted Duration of Therapy until discharge;until goals are met   Anticipated Equipment Needs at Discharge to be determined   Anticipated Discharge Disposition skilled nursing facility   Evaluation Complexity Justification   Occupational Profile Review Extensive Review   Performance Deficits 5+ deficits;Attention;Strength;Range of motion;Coordination;Sensation;Endurance;Balance;Mobility;Pain   Clinical Decision Making  High analytic complexity   Evaluation Complexity High       Therapist:   Terrill Mohr, OT   Pager #: [phone]

## 2018-05-07 NOTE — Care Plan (Signed)
Respiratory Orders   (From admission, onward)   Start   Ordered   05/06/18 1800  OXYGEN - AEROSOL TRACH COLLAR CONTINUOUS    Discontinue   Comments: FIO2 > 50% Will be set up with Double Flow O2   Duration: Until Specified    Priority: Routine       Question Answer Comment   FIO2 (%) 30    Indications for O2 IMPROVE OXYGENATION        05/06/18 1747   05/05/18 1845  VENTILATOR - CPAP(PS) / SPONTANEOUS CONTINUOUS    Discontinue   Duration: Until Specified    Priority: Routine       Question Answer Comment   FIO2 (%) 30    Peep(cm/H2O) 5    Pressure Support(cm/H2O) 10              Respiratory Medications   (From admission, onward)   Start   Ordered Stop   04/29/18 1400  ipratropium-albuterol 0.5 mg-3 mg(2.5 mg base)/3 mL Solution for Nebulization 3 mL, Nebulization, EVERY 6 HOURS          Will continue to follow and monitor patient's respiratory status weaning as tolerated.

## 2018-05-08 ENCOUNTER — Inpatient Hospital Stay (HOSPITAL_COMMUNITY): Payer: Medicaid Other

## 2018-05-08 ENCOUNTER — Encounter (HOSPITAL_COMMUNITY): Payer: Self-pay

## 2018-05-08 DIAGNOSIS — I82612 Acute embolism and thrombosis of superficial veins of left upper extremity: Secondary | ICD-10-CM

## 2018-05-08 DIAGNOSIS — R918 Other nonspecific abnormal finding of lung field: Secondary | ICD-10-CM

## 2018-05-08 DIAGNOSIS — J90 Pleural effusion, not elsewhere classified: Secondary | ICD-10-CM

## 2018-05-08 DIAGNOSIS — E871 Hypo-osmolality and hyponatremia: Secondary | ICD-10-CM

## 2018-05-08 DIAGNOSIS — A4901 Methicillin susceptible Staphylococcus aureus infection, unspecified site: Secondary | ICD-10-CM

## 2018-05-08 DIAGNOSIS — Z01818 Encounter for other preprocedural examination: Secondary | ICD-10-CM

## 2018-05-08 LAB — PHOSPHORUS
PHOSPHORUS: 3.6 mg/dL (ref 2.4–4.7)
PHOSPHORUS: 3.8 mg/dL (ref 2.4–4.7)

## 2018-05-08 LAB — POC BLOOD GLUCOSE (RESULTS)
GLUCOSE, POC: 168 mg/dL — ABNORMAL HIGH (ref 70–105)
GLUCOSE, POC: 161 mg/dL — ABNORMAL HIGH (ref 70–105)
GLUCOSE, POC: 150 mg/dL — ABNORMAL HIGH (ref 70–105)

## 2018-05-08 LAB — URINALYSIS, MACROSCOPIC
BILIRUBIN: NEGATIVE mg/dL
COLOR: NORMAL
GLUCOSE: NEGATIVE mg/dL
KETONES: NEGATIVE mg/dL
NITRITE: NEGATIVE
PH: 7 (ref 5.0–8.0)
PROTEIN: NEGATIVE mg/dL
SPECIFIC GRAVITY: 1.023 (ref 1.005–1.030)
UROBILINOGEN: 4 mg/dL — AB

## 2018-05-08 LAB — URINALYSIS, MICROSCOPIC
RBCS: 118 /HPF — ABNORMAL HIGH (ref <6.0)
WBCS: 7 /HPF — ABNORMAL HIGH (ref <4.0)

## 2018-05-08 LAB — CBC
HCT: 24.7 % — ABNORMAL LOW (ref 38.9–52.0)
HGB: 7.5 g/dL — ABNORMAL LOW (ref 13.4–17.5)
MCH: 27.2 pg (ref 26.0–32.0)
MCHC: 30.4 g/dL — ABNORMAL LOW (ref 31.0–35.5)
MCV: 89.5 fL (ref 78.0–100.0)
MPV: 11.9 fL (ref 8.7–12.5)
PLATELETS: 284 x10ˆ3/uL (ref 150–400)
RBC: 2.76 x10ˆ6/uL — ABNORMAL LOW (ref 4.50–6.10)
RDW-CV: 21.4 % — ABNORMAL HIGH (ref 11.5–15.5)
RDW-CV: 21.4 % — ABNORMAL HIGH (ref 11.5–15.5)
WBC: 8.5 x10ˆ3/uL (ref 3.7–11.0)

## 2018-05-08 LAB — BASIC METABOLIC PANEL
ANION GAP: 6 mmol/L (ref 4–13)
ANION GAP: 6 mmol/L (ref 4–13)
BUN/CREA RATIO: 18 (ref 6–22)
BUN/CREA RATIO: 20 (ref 6–22)
BUN: 9 mg/dL (ref 8–25)
BUN: 9 mg/dL (ref 8–25)
BUN: 10 mg/dL (ref 8–25)
CALCIUM: 7.5 mg/dL — ABNORMAL LOW (ref 8.5–10.2)
CALCIUM: 7.2 mg/dL — ABNORMAL LOW (ref 8.5–10.2)
CHLORIDE: 98 mmol/L (ref 96–111)
CHLORIDE: 97 mmol/L (ref 96–111)
CO2 TOTAL: 29 mmol/L (ref 22–32)
CO2 TOTAL: 32 mmol/L (ref 22–32)
CREATININE: 0.5 mg/dL — ABNORMAL LOW (ref 0.62–1.27)
CREATININE: 0.5 mg/dL — ABNORMAL LOW (ref 0.62–1.27)
ESTIMATED GFR: >60 mL/min/1.73mˆ2 (ref >60)
ESTIMATED GFR: >60 mL/min/1.73mˆ2 (ref >60)
GLUCOSE: 159 mg/dL — ABNORMAL HIGH (ref 65–139)
GLUCOSE: 140 mg/dL — ABNORMAL HIGH (ref 65–139)
POTASSIUM: 3.6 mmol/L (ref 3.5–5.1)
POTASSIUM: 3.6 mmol/L (ref 3.5–5.1)
SODIUM: 133 mmol/L — ABNORMAL LOW (ref 136–145)
SODIUM: 135 mmol/L — ABNORMAL LOW (ref 136–145)

## 2018-05-08 LAB — BRONCHOALVEOLAR LAVAGE (BAL) QUANTITATIVE CULTURE/GRAM STAIN
BRONCHOALVEOLAR LAVAGE (BAL) QUANTITATIVE CULTURE: 30000 — AB
BRONCHOALVEOLAR LAVAGE (BAL) QUANTITATIVE CULTURE: <100 — AB

## 2018-05-08 LAB — CBC WITH DIFF: LYMPHOCYTE %: 13 % — AB (ref 25–40)

## 2018-05-08 LAB — MAGNESIUM
MAGNESIUM: 1.9 mg/dL (ref 1.6–2.6)
MAGNESIUM: 1.9 mg/dL (ref 1.6–2.6)

## 2018-05-08 MED ORDER — DEXTROSE 5 % IN WATER (D5W) INTRAVENOUS SOLUTION
2.00 g | Freq: Three times a day (TID) | INTRAVENOUS | Status: DC
Start: 2018-05-08 — End: 2018-05-09
  Administered 2018-05-08: 2 g via INTRAVENOUS
  Administered 2018-05-08 (×2): 0 g via INTRAVENOUS
  Administered 2018-05-08 – 2018-05-09 (×2): 2 g via INTRAVENOUS
  Administered 2018-05-09: 0 g via INTRAVENOUS
  Filled 2018-05-08 (×4): qty 20

## 2018-05-08 MED ORDER — NYSTATIN 100,000 UNIT/GRAM TOPICAL POWDER
Freq: Two times a day (BID) | CUTANEOUS | Status: DC
Start: 2018-05-08 — End: 2018-05-19
  Administered 2018-05-17 – 2018-05-19 (×3): via TOPICAL
  Filled 2018-05-08 (×2): qty 15

## 2018-05-08 MED ORDER — FUROSEMIDE 10 MG/ML INJECTION SOLUTION
20.0000 mg | Freq: Once | INTRAMUSCULAR | Status: AC
Start: 2018-05-08 — End: 2018-05-08
  Administered 2018-05-08: 20 mg via INTRAVENOUS
  Filled 2018-05-08: qty 2

## 2018-05-08 MED ADMIN — sodium chloride 0.9 % (flush) injection syringe: INTRAVENOUS | @ 10:00:00

## 2018-05-08 MED ADMIN — sodium chloride 0.9 % (flush) injection syringe: @ 06:00:00

## 2018-05-08 MED ADMIN — insulin regular human 100 unit/mL injection solution: SUBCUTANEOUS | @ 01:00:00

## 2018-05-08 MED ADMIN — albuterol sulfate concentrate 2.5 mg/0.5 mL solution for nebulization: RESPIRATORY_TRACT | @ 13:00:00

## 2018-05-08 MED ADMIN — nystatin 100,000 unit/gram topical powder: TOPICAL | @ 09:00:00

## 2018-05-08 MED ADMIN — senna leaf extract 176 mg/5 mL oral syrup: GASTROSTOMY | @ 21:00:00

## 2018-05-08 MED ADMIN — famotidine 20 mg tablet: GASTROSTOMY | @ 09:00:00

## 2018-05-08 MED ADMIN — sodium chloride 0.9 % (flush) injection syringe: RESPIRATORY_TRACT | @ 05:00:00

## 2018-05-08 MED ADMIN — LIDOCAINE 0.5%/SODIUM BICARB 0.84% LOCAL INJ: RESPIRATORY_TRACT | @ 05:00:00

## 2018-05-08 MED ADMIN — PHENYLEPHRINE 60MG IN NS OR D5W 250ML INFUSION: ORAL | @ 08:00:00

## 2018-05-08 MED ADMIN — HYDROcodone 5 mg-acetaminophen 325 mg tablet: RESPIRATORY_TRACT | @ 20:00:00

## 2018-05-08 MED ADMIN — lactated Ringers intravenous solution: GASTROSTOMY | @ 09:00:00

## 2018-05-08 MED ADMIN — electrolyte-A intravenous solution: GASTROSTOMY | @ 21:00:00 | NDC 00338022104

## 2018-05-08 MED ADMIN — ondansetron 4 mg disintegrating tablet: SUBCUTANEOUS | @ 20:00:00

## 2018-05-08 MED ADMIN — sodium chloride 0.9 % intravenous solution: SUBCUTANEOUS | @ 09:00:00

## 2018-05-08 MED ADMIN — sodium chloride 0.9 % (flush) injection syringe: GASTROSTOMY | @ 14:00:00

## 2018-05-08 MED ADMIN — predniSONE 20 mg tablet: GASTROSTOMY | @ 04:00:00

## 2018-05-08 MED ADMIN — sodium chloride 0.9 % intravenous solution: INTRAVENOUS | @ 19:00:00

## 2018-05-08 MED ADMIN — amLODIPine 5 mg-benazepriL 20 mg capsule: GASTROSTOMY | @ 08:00:00

## 2018-05-08 MED ADMIN — sodium chloride 0.9 % (flush) injection syringe: GASTROSTOMY | @ 09:00:00

## 2018-05-08 NOTE — Progress Notes (Signed)
[facility]                                               SICU PROGRESS NOTE    [place], Traweek  Date of Admission:  04/28/2018  Date of Service: 05/08/2018  Date of Birth:  01/13/1961    Primary Attending:  [name]  Primary Service:  Trauma Blue    Post Op Day:5 Days Post-Op S/P Procedure(s) (LRB):  TRACHEOSTOMY (N/A)  GASTROSCOPY WITH PLACEMENT PEG TUBE (N/A)     LOS: 10 days      Subjective:    Resting comfortably in chair. NAEO.     Vital Signs:  Temp (24hrs) Max:38.4 C (568.1 F)      Systolic (27NTZ), GYF:749 , Min:95 , SWH:675     Diastolic (91MBW), GYK:59, Min:53, Max:100    Temp  Avg: 37.8 C (100 F)  Min: 37.2 C (99 F)  Max: 38.4 C (101.1 F)  MAP (Non-Invasive)  Avg: 85.2 mmHG  Min: 63 mmHG  Max: 107 mmHG  Pulse  Avg: 102.5  Min: 93  Max: 117  Resp  Avg: 30  Min: 22  Max: 40  SpO2  Avg: 98.2 %  Min: 95 %  Max: 100 %  Pain Score (Numeric, Faces): Other    Meds  No current outpatient medications on file.     acetaminophen (TYLENOL) 160 mg per 5 mL liquid - grape flavored, 650 mg, Gastric (NG, OG, PEG, GT), Q4H PRN  albuterol (PROVENTIL) 2.54m/ 0.5 mL nebulizer solution, 2.5 mg, Nebulization, 3x/day    And  sodium chloride (7% SALINE) nebulizer solution, 4 mL, Nebulization, 3x/day  aspirin chewable tablet 81 mg, 81 mg, Gastric (NG, OG, PEG, GT), Daily  chlorhexidine gluconate (PERIDEX) 0.12% mouthwash, 15 mL, Swish & Spit, 2x/day  enoxaparin PF (LOVENOX) 60 mg/0.6 mL SubQ injection, 0.5 mg/kg, Subcutaneous, Q12H  famotidine (PEPCID) tablet, 20 mg, Gastric (NG, OG, PEG, GT), 2x/day  gabapentin (NEURONTIN) 3032mper 22m3mral liquid, 300 mg, Gastric (NG, OG, PEG, GT), 3x/day  haloperidol (HALDOL) 5 mg/mL injection, 5 mg, Intravenous, Q6H PRN  lanolin-oxyquin-pet, hydrophil (BAG BALM) topical ointment, , Apply Topically, 2x/day PRN  linezolid (ZYVOX) 600 mg in iso-osmotic 300 mL premix IVPB, 600 mg, Intravenous, 2x/day  melatonin tablet, 6 mg, Gastric (NG, OG, PEG, GT), NIGHTLY  NS  flush syringe, 2 mL, Intracatheter, Q8HRS    And  NS flush syringe, 2-6 mL, Intracatheter, Q1 MIN PRN  nutrition protein supplement 15 g per 30 mL packet, 2 Packet, Gastric (NG, OG, PEG, GT), Daily  polyethylene glycol (MIRALAX) oral packet, 17 g, Gastric (NG, OG, PEG, GT), Daily  prenatal vitamin-iron-folic acid tablet, 1 Tab, Gastric (NG, OG, PEG, GT), Daily  propranolol (INDERAL) tablet, 10 mg, Gastric (NG, OG, PEG, GT), 3x/day  senna concentrate (SENNA) 528m422mr 15mL59ml liquid, 5 mL, Gastric (NG, OG, PEG, GT), 2x/day  SSIP insulin R human (HUMULIN R) 100 units/mL injection, 0-12 Units, Subcutaneous, Q4H PRN        Physical Exam:   GEN:cDJT:[id number] frail male appears older than stated age   HEENT:Pupils 22mm b13mterally,large posterior scalp hematoma  NECK:Trachea midline, tracheostomy in place  PULM:Diminished breath sounds bilaterally, course, Tracheostomy in place   CARDIAC:Afib   CHEST::No abrasions or contusions  ABD:AbSPQ:ZRAQTMA and nondistended  PELVIS:No evidence of injury  GU/RECTAL:large scrotal edema  resolving   BACK:No evidence of injury and no step-off  WN:UUVOZD noted on bilateral knees and feet  NEURO:Intubated, following commands  GLASGOW COMA SCALE:Eye opening:4 spontaneous, Verbal response:Trachostomy , Best motor response:6 obeys commands  VASCULAR:All pulses palpable and equal bilaterally  INTEGUMENTARY:Pink, warm, and dry andanasarca      Labs:  I have reviewed all lab results.    Recent Imaging:  Results for orders placed or performed during the hospital encounter of 04/28/18 (from the past 72 hour(s))   XR AP MOBILE CHEST     Status: None    Narrative    Jearld Lesch  Male, 58 years old.    XR AP MOBILE CHEST performed on 05/05/2018 7:39 AM.    REASON FOR EXAM:  tracheostomy    TECHNIQUE: 1 views/1 images submitted for interpretation.    COMPARISON:  05/04/2018    FINDINGS:  The heart is enlarged. There is no focal consolidation, pleural  effusion,  or pneumothorax. Tracheostomy is unchanged.      Impression    Stable cardiomegaly with no new abnormality seen.     XR AP MOBILE CHEST     Status: None    Narrative    ACHERON SUGG  Male, 58 years old.    XR AP MOBILE CHEST performed on 05/05/2018 9:25 PM.    REASON FOR EXAM:  New onset fever    TECHNIQUE: 1 view/2 image(s) submitted for interpretation.    COMPARISON: 05/05/2018.    FINDINGS:      LUNGS: No focal consolidation.    HEART AND VASCULATURE: Persistent cardiomegaly.    PLEURA: No pleural effusion is present. No pneumothorax is demonstrated.    SUPPORT DEVICES:  Stable tracheostomy tube.      Impression    Stable cardiomegaly without new focal consolidation.     XR AP MOBILE CHEST     Status: None    Narrative    ZAYD BONET  Male, 58 years old.    XR AP MOBILE CHEST performed on 05/06/2018 6:08 AM.    REASON FOR EXAM:  tracheostomy    TECHNIQUE: 1 view/1 image(s) submitted for interpretation.    COMPARISON: 05/05/2018    FINDINGS:      LUNGS: No focal consolidation.    HEART AND VASCULATURE: Stable heart size.    PLEURA: No pleural effusion is present. No pneumothorax is demonstrated.      Impression    Stable aeration without new focal consolidation.     XR AP MOBILE CHEST     Status: None    Narrative    KHALIEL MOREY  Male, 58 years old.    XR AP MOBILE CHEST performed on 05/07/2018 4:20 AM.    REASON FOR EXAM:  tracheostomy    TECHNIQUE: 1 view/1 image(s) submitted for interpretation.    COMPARISON: 05/06/2018    FINDINGS:      LUNGS: Bibasilar airspace opacities.    HEART AND VASCULATURE: Persistent cardiomegaly.    PLEURA: Small bilateral effusions. No pneumothorax is demonstrated.    SUPPORT DEVICES:  Stable tracheostomy tube.      Impression    Persistent cardiomegaly, small effusions and bibasilar airspace opacities.         Assessment/ Plan:  Active Hospital Problems    Diagnosis   . Primary Problem: Fall   . COPD (chronic obstructive pulmonary disease) (CMS HCC)   . Thrombocytopenia Unspecified     . Hypokalemia   . Alcohol abuse   .  Troponin I above reference range   . Acute respiratory failure (CMS HCC)   . Nasal fracture   . Abrasions of multiple sites   . Scalp hematoma   . Humeral fracture   . UTI (urinary tract infection)   . Left inguinal hernia   . Anasarca       FAIRLEY COPHER is a 58 y.o. male who is 5 Days Post-Op S/P Procedure(s) (LRB):  TRACHEOSTOMY (N/A)  GASTROSCOPY WITH PLACEMENT PEG TUBE (N/A)      NEURO:  GCS: E4=Spontaneous (Opens Eyes on Own) M6=Normal (Follows Simple Commands) V1=None (Makes No Noise or Intubated)  Sedation/analgesia: gabapentin, tylenol PRN, haldol PRN  Melatonin  Prenatal vitamin    CARDIOVASCULAR:  Systolic (87OMV), EHM:094 , Min:95 , BSJ:628     Diastolic (36OQH), UTM:54, Min:53, Max:100     ART-Line  MAP: 85 mmHg   Troponins: No results found for: CKMB    MAP- 63-107  Meds: aspirin, propanolol  Tachycardic to 117    PULMONARY:  Airway Ventilator Settings   Tracheostomy 8 Cuffed (Active)   Trach Status Secured 05/08/2018  3:37 AM   Site Condition Intact 05/07/2018  8:00 PM   [place] Site Cleaned 05/07/2018  8:00 PM   [place] @ Bedside? Yes 05/07/2018  8:00 PM   Bilateral General Breath Sounds Coarse;Expiratory Wheezes 05/07/2018  8:00 PM    Ventilator Settings:  Mode: SPONT  Set PEEP: 5 cmH2O  Pressure Support: 10 cmH2O  FiO2: 30 %  I:E Ratio: 1:1.9   SpO2  Avg: 98.2 %  Min: 95 %  Max: 100 %  Blood Gas:  No results found for this encounter  Nebs: albuterol TID, sodium chloride TID  BAL 3/21--> 30,000 S. Aureus, <100 GNR  Tolerated ATC during day  Hypertonic nebulizers added overnight for thickened secretions   Imaging: CXR shows improvement of the edema in the lower lobes bilaterally     GI:  MNT PROTOCOL FOR DIETITIAN  ADULT TUBE FEEDING - CONTINUOUS DRIP CONTINUOUS NO MEALS, TF ONLY; OSMOLITE 1.5; OG; Initial Rate (ml/hr): 20; Increase by: 10 ml every 6 hours; Goal Rate (ml/hr): 60  DIET NPO - NOW EXCEPT TUBE FEEDS   Recent Labs     05/05/18  2221   BILIRUBIN  Negative     Last BM: Last Bowel Movement: 05/07/18  Proph: Senokot, Pepcid, miralax    RENAL/GU:  Recent Labs     05/05/18  2221 05/06/18  0112 05/07/18  0118 05/08/18  0053   SODIUM  --  138 136 133*   POTASSIUM  --  3.4* 3.9 3.6   CHLORIDE  --  104 102 98   BUN  --  _0 CREATININE  --  0.51* 0.49* 0.50*   GLUCOSE Negative  --   --   --    ANIONGAP  --  _1 CALCIUM  --  7.3* 7.5* 7.5*   MAGNESIUM  --  2.0 1.9 1.9   PHOSPHORUS  --  2.7 3.1 3.6       I/O last 24 hours:      Intake/Output Summary (Last 24 hours) at 05/08/2018 0550  Last data filed at 05/08/2018 0500  Gross per 24 hour   Intake 2010 ml   Output 3175 ml   Net -1165 ml     I/O current shift:  03/22 1900 - 03/23 0659  In: 1020 [I.V.:300; GT:720]  Out: 640 [Urine:640]  Foley in critically  ill pt for strict I/O's  Urine output 50-80 cc/hr since midnight (0.55-0.88 cc/kg/hr)  Patient diuresed yesterday w 20 mg lasix  UA does not show evidence of infection     HEME:  Recent Labs     05/06/18  0112 05/07/18  0118 05/08/18  0053   HGB 7.3* 7.2* 7.5*   HCT 24.2* 24.0* 24.7*   PLTCNT 224 269 284     Transfusions:   Proph: SCD's, Lovenox     ID:  Temp (24hrs) Max:38.4 C (101.1 F)    Recent Labs     05/06/18  0112 05/07/18  0118 05/08/18  0053   WBC 8.2 8.5 8.5     Febrile    Cultures:  ABX: Linezolid, s.aureus pan-sensitive, will discuss de-escalation   New blood cultures drawn  G+C negative    ENDO:  SSI    MSK:  No skin breakdown    OTHER:  Lines: peripheral IV x2, foley, gastrostomy tube  Activity: Bedrest, HOB 30deg  PT/OT:  SNF  MNT:  ordered    PLAN:  Febrile and tachycardic overnight, new blood and urine cultures drawn  BAL positive for S. Aureus, de-escalation of antibiotics   Continue to wean off vent, tolerated ATC during the day       Verlon Au, MD 05/08/2018, 05:50       Late entry for 05/08/18. I saw and examined the patient.  I reviewed the resident's note.  I agree with the findings and plan of care as documented in the resident's note.   Any exceptions/additions are edited/noted.    SP fall with humerus fx, acute resp failure requiring intubation and tracheostomy, inanition requiring peg    Main issues are ventilator wean. ATC during day and rests on PSV overnight. Has relatively thick secretions requiring freq suctioning and hypertonic nebs    CXR stable and improving from prior    Goal tube feeds    Hyponatremia 133 today (136 prior), monitor for now  Goal 1L negative fluid balance, achieved with daily lasix.    BAL positive for MSSA change to Ancef    Critical Care Attestation    I was present at the bedside of this critically ill patient for 45 minutes exclusive of procedures.  This patient suffers from failure or dysfunction of Pulmonary, GI/Hepatopancreaticobiliary, Renal and Immue/Allergic system(s).  The care of this patient was in regard to managing (a) conditions(s) that has a high probability of sudden, clinically significant, or life-threatening deterioration and required a high degree of Attending Physician attention and direct involvement to intervene urgently. Data review and care planning was performed in direct proximity of the patient, examination was obviously performed in direct contact with the patient. All of this time was exclusive of procedure which will be documented elsewhere in the chart.    My critical care time is independent and unique to other providers (no other providers saw patient for purposes of sicu evaluation)  My critical care time involved full attention to the patients' condition and included:    Review of nursing notes and/or old charts  Review of medications, allergies, and vital signs  Documentation time  Consultant collaboration on findings and treatment options  Care, transfer of care, and discharge plans  Ordering, interpreting, and reviewing diagnostic studies/tab tests  Obtaining necessary history from family, EMS, nursing home staff and/or treating physicians    My critical care time did not include  time spent teaching resident physician(s) or other services of resident physicians, or  performing other reported procedures.  Total Critical Care Time: 45 minutes    Herschell Dimes, MD

## 2018-05-08 NOTE — Care Plan (Signed)
Patient more alert. Able to nod yes/no to questions and following commands. NAEO. Turned q2. VSS. Trach care performed.

## 2018-05-08 NOTE — Progress Notes (Signed)
[facility]                                                      Trauma Progress Note                 Date of Birth:  April 12, 1960  Date of Admission:  04/28/2018  Date of service: 05/08/2018    Antonio Gillespie, 58 y.o., male Post trauma day 10 status post Fall      Events over the last 24 hours have included:  Tolerated ATC throughout the day yesterday  Febrile overnight, repeat cultures drawn    Subjective:    Patient up in chair this morning. More alert than on prior exams. Unable to effectively communicate.       Objective   24 Hour Summary:    Filed Vitals:    05/08/18 0700 05/08/18 0741 05/08/18 0827 05/08/18 0832   BP: 103/71   121/76   Pulse: (!) 105   98   Resp: (!) 31      Temp:  37.6 C (99.7 F)     SpO2:   98%      Labs:  Recent Labs     05/05/18  2221 05/06/18  0112 05/07/18  0118 05/08/18  0053 05/08/18  0540   WBC  --  8.2 8.5 8.5  --    HGB  --  7.3* 7.2* 7.5*  --    HCT  --  24.2* 24.0* 24.7*  --    SODIUM  --  138 136 133*  --    POTASSIUM  --  3.4* 3.9 3.6  --    CHLORIDE  --  104 102 98  --    BUN  --  _0 --    CREATININE  --  0.51* 0.49* 0.50*  --    GLUCOSE Negative  --   --   --  Negative   ANIONGAP  --  _1 --    CALCIUM  --  7.3* 7.5* 7.5*  --    MAGNESIUM  --  2.0 1.9 1.9  --    PHOSPHORUS  --  2.7 3.1 3.6  --        Intake/Output Summary (Last 24 hours) at 05/08/2018 0854  Last data filed at 05/08/2018 0700  Gross per 24 hour   Intake 1950 ml   Output 3110 ml   Net -1160 ml     Nutrition Management: MNT PROTOCOL FOR DIETITIAN  ADULT TUBE FEEDING - CONTINUOUS DRIP CONTINUOUS NO MEALS, TF ONLY; OSMOLITE 1.5; OG; Initial Rate (ml/hr): 20; Increase by: 10 ml every 6 hours; Goal Rate (ml/hr): 60  DIET NPO - NOW EXCEPT TUBE FEEDS Last Bowel Movement: 05/07/18  No results for input(s): ALBUMIN, PREALBUMIN in the last 72 hours.  Current Medications:    Current Facility-Administered Medications:   .  acetaminophen (TYLENOL) 160 mg per 5 mL liquid - grape flavored, 650 mg, Gastric (NG, OG,  PEG, GT), Q4H PRN, Thimm, Terra, MD, 650 mg at 05/08/18 0333  .  albuterol (PROVENTIL) 2.17m/ 0.5 mL nebulizer solution, 2.5 mg, Nebulization, 3x/day, 2.5 mg at 05/08/18 0524 **AND** sodium chloride (7% SALINE) nebulizer solution, 4 mL, Nebulization, 3x/day, RMariea Clonts MD, 4 mL at 05/08/18 0524  .  aspirin chewable tablet  81 mg, 81 mg, Gastric (NG, OG, PEG, GT), Daily, McCoy, Skyler, MD, 81 mg at 05/08/18 0832  .  chlorhexidine gluconate (PERIDEX) 0.12% mouthwash, 15 mL, Swish & Spit, 2x/day, Harvin Hazel, Anderson Malta, MD, 15 mL at 05/08/18 [phone]  .  enoxaparin PF (LOVENOX) 60 mg/0.6 mL SubQ injection, 0.5 mg/kg, Subcutaneous, Q12H, Ann Held, MD, 50 mg at 05/08/18 7353  .  famotidine (PEPCID) tablet, 20 mg, Gastric (NG, OG, PEG, GT), 2x/day, Harvin Hazel, Anderson Malta, MD, 20 mg at 05/08/18 [phone]  .  gabapentin (NEURONTIN) 31m per 61moral liquid, 300 mg, Gastric (NG, OG, PEG, GT), 3x/day, BaAnn HeldMD, 300 mg at 05/08/18 [id number].  haloperidol (HALDOL) 5 mg/mL injection, 5 mg, Intravenous, Q6H PRN, BoVerlon AuMD, 5 mg at 05/07/18 0135  .  lanolin-oxyquin-pet, hydrophil (BAG BALM) topical ointment, , Apply Topically, 2x/day PRN, BaMarquette OldMD  .  linezolid (ZYVOX) 600 mg in iso-osmotic 300 mL premix IVPB, 600 mg, Intravenous, 2x/day, Thimm, Terra, MD, Last Rate: 300 mL/hr at 05/08/18 0833, 600 mg at 05/08/18 0833  .  melatonin tablet, 6 mg, Gastric (NG, OG, PEG, GT), NIGHTLY, Thimm, Terra, MD, 6 mg at 05/07/18 2126  .  INSERT & MAINTAIN PERIPHERAL IV ACCESS, , , UNTIL DISCONTINUED **AND** DRESSING CHANGE Saline Lock; DRY STERILE, , , PRN **AND** NS flush syringe, 2 mL, Intracatheter, Q8HRS, Stopped at 05/07/18 0600 **AND** NS flush syringe, 2-6 mL, Intracatheter, Q1 MIN PRN, ChAgustina CaroliMD  .  nutrition protein supplement 15 g per 30 mL packet, 2 Packet, Gastric (NG, OG, PEG, GT), Daily, BaMarquette OldMD, 30 g at 05/08/18 0900  .  nystatin (NYSTOP) 100,000 units/g topical powder, , Apply  Topically, 2x/day, BaMarquette OldMD  .  polyethylene glycol (MIRALAX) oral packet, 17 g, Gastric (NG, OG, PEG, GT), Daily, SpDimple NanasMD, 17 g at 05/08/18 0831  .  prenatal vitamin-iron-folic acid tablet, 1 Tab, Gastric (NG, OG, PEG, GT), Daily, BaAnn HeldMD, 1 Tab at 05/08/18 [phone].  propranolol (INDERAL) tablet, 10 mg, Gastric (NG, OG, PEG, GT), 3x/day, McCoy, Skyler, MD, 10 mg at 05/08/18 0832  .  senna concentrate (SENNA) 52814mer 91m77mal liquid, 5 mL, Gastric (NG, OG, PEG, GT), 2x/day, KnigHarvin HazelnnAnderson Malta, 176 mg at 05/08/18 [id number] SSIP insulin R human (HUMULIN R) 100 units/mL injection, 0-12 Units, Subcutaneous, Q4H PRN, 2 Units at 05/08/18 0105 **AND** POCT WHOLE BLOOD GLUCOSE, , , Q6H, BootVerlon Au     Today's Physical Exam:  GEN:   NAD and in chair, acutely ill  HEENT:   Normocephalic; lip with eschar over left upper region        NECK:   Trach in place  PULM:   Non-labored respirations  CV:   Tachycardic  ABD:   Abdomen soft, nontender, and nondistended. Left inguinal hernia present, but reducible  NEURO: Eyes open spontaneously, follows simple commands   Integumentary:  Pink, warm, and dry    CXR: Improved aeration of bilateral lung bases, decreased pleural fluid    Assessment:  Active Hospital Problems   (*Primary Problem)    Diagnosis   . *Fall     "found down" and taken to outside hospital     . COPD (chronic obstructive pulmonary disease) (CMS HCC)     Chronic   . Thrombocytopenia Unspecified   . Hypokalemia   . Alcohol abuse   . Troponin I above reference range   . Acute respiratory failure (  CMS [place])   . Nasal fracture     Acute vs chronic     . Abrasions of multiple sites   . Scalp hematoma   . Humeral fracture     nonop per ortho     . UTI (urinary tract infection)     At outside hospital. Repeat UA pending     . Left inguinal hernia     S/p reduction     . Anasarca     Plan:  Nutrition: MNT PROTOCOL FOR DIETITIAN  ADULT TUBE FEEDING - CONTINUOUS DRIP CONTINUOUS NO  MEALS, TF ONLY; OSMOLITE 1.5; OG; Initial Rate (ml/hr): 20; Increase by: 10 ml every 6 hours; Goal Rate (ml/hr): 60  DIET NPO - NOW EXCEPT TUBE FEEDS diet, Last Bowel Movement: 05/07/18  Activity: As tolerated    -Continue ATC as tolerated   - Attempt to wean nighttime ventilator use  -Lovenox for DVT ppx  -BAL 3/21 with staph aureus   - Linezolid de-escalated to Ancef  -Follow up cultures from 3/23  -Tube feeds at goal    -Dispo - will need SNF when medically ready and off vent for 24 hours    Jari Pigg, MD  05/08/2018, 14:47    Trauma/Surgical Critical Care/Acute Care Surgery Staff  Late entry for 05/08/2018.  I saw and examined the patient.  I reviewed the Resident note .  I agree with their findings and plan of care as documented in their note.  Any exceptions/additions are edited/noted.  58 year old male with adult failure to thrive, acute respiratory failure, and humerus fracture.  Was found down by family.  Has been hospitalized for at least 10 days, has undergone tracheostomy and PEG placement.  Tolerating tube feeds without difficulty, sick you working on transition to aerosol trach trials.  Will be difficult disposition due to multiple chronic medical problems and history of substance abuse.  Care management involved.  Obtaining additional cultures, remains on Ancef for pneumonia  Electronically Signed by:    Susa Loffler, DO, FACS  Associate Professor of Surgery  Trauma, Surgical Critical Care, and Acute Care Surgery  05/13/2018 13:45

## 2018-05-08 NOTE — Care Plan (Signed)
Pt on PSV 10/5  30% all night.  Placed on 30% ATC this AM.  Hypertonic solution added to nebs for thick secretions.  Will continue to follow and monitor patient's respiratory status weaning as tolerated.

## 2018-05-08 NOTE — Progress Notes (Signed)
Family Phone call    The designated family member, Harjit Portman, was able to be reached by phone for updates.  An update of the patient's status was reviewed and all questions were answered.    Gerhard Munch, MD  05/08/2018, 16:28

## 2018-05-08 NOTE — Care Plan (Signed)
Improved neuro status. GCS 4,6,1. Follows commands. ATC throughout the day, suctions for large amount of secretions. Pulmonary toileting, percuss/vibrate bed, pt tolerates. Lasix 20mg  with increased urine output. TF at goal of 60, pt tolerates well. Q2hr turns with preventative Mepilex. PUP. VTE prophylaxis, SCD and Enoxaparin. Q4hr oral care.     Problem: Adult Inpatient Plan of Care  Goal: Plan of Care Review  Outcome: Ongoing (see interventions/notes)  Goal: Patient-Specific Goal (Individualized)  Outcome: Ongoing (see interventions/notes)  Flowsheets  Taken 05/08/2018 1803 by Anastasio Champion, RN  Individualized Care Needs: frequent reorientation provided, pulmonary toileting  Anxieties, Fears or Concerns: UTA, trach  Taken 05/07/2018 2000 by Jenel Lucks, RN  Patient-Specific Goals (Include Timeframe): Regain orientation and improve communication.  Goal: Absence of Hospital-Acquired Illness or Injury  Outcome: Ongoing (see interventions/notes)  Goal: Optimal Comfort and Wellbeing  Outcome: Ongoing (see interventions/notes)     Problem: Communication Impairment (Mechanical Ventilation, Invasive)  Goal: Effective Communication  Outcome: Ongoing (see interventions/notes)     Problem: Device-Related Complication Risk (Mechanical Ventilation, Invasive)  Goal: Optimal Device Function  Outcome: Ongoing (see interventions/notes)     Problem: Inability to Wean (Mechanical Ventilation, Invasive)  Goal: Mechanical Ventilation Liberation  Outcome: Ongoing (see interventions/notes)     Problem: Nutrition Impairment (Mechanical Ventilation, Invasive)  Goal: Optimal Nutrition Delivery  Outcome: Ongoing (see interventions/notes)     Problem: Skin and Tissue Injury (Mechanical Ventilation, Invasive)  Goal: Absence of Device-Related Skin and Tissue Injury  Outcome: Ongoing (see interventions/notes)     Problem: Ventilator-Induced Lung Injury (Mechanical Ventilation, Invasive)  Goal: Absence of Ventilator-Induced Lung  Injury  Outcome: Ongoing (see interventions/notes)     Problem: Fall Injury Risk  Goal: Absence of Fall and Fall-Related Injury  Outcome: Ongoing (see interventions/notes)     Problem: Non-violent/Non-Self Destructive Restraints  Goal: Alternative methods tried prior to restraints  Outcome: Ongoing (see interventions/notes)  Goal: Patient free from injury and discomfort  Outcome: Ongoing (see interventions/notes)  Goal: Autonomy maintained at the highest possible level  Outcome: Ongoing (see interventions/notes)  Goal: Need for restraints reassessed per policy  Outcome: Ongoing (see interventions/notes)  Goal: Patient education provided  Outcome: Ongoing (see interventions/notes)  Goal: Problem Interventions  Outcome: Ongoing (see interventions/notes)     Problem: Skin Injury Risk Increased  Goal: Skin Health and Integrity  Outcome: Ongoing (see interventions/notes)     Problem: Acute Rehab Services Goal & Intervention Plan  Goal: Bathing Goal  Description: Stand Alone Therapy Goal  Outcome: Ongoing (see interventions/notes)  Goal: Bed Mobility Goal  Description: Stand Alone Therapy Goal  Outcome: Ongoing (see interventions/notes)  Goal: Caregiver Training Goal  Description: Stand Alone Therapy Goal  Outcome: Ongoing (see interventions/notes)  Goal: Cognition Goal  Description: Stand Alone Therapy Goal  Outcome: Ongoing (see interventions/notes)  Goal: Cognition Goals, SLP  Description: Stand Alone Therapy Goal  Outcome: Ongoing (see interventions/notes)  Goal: Communication Goals, SLP  Description: Stand Alone Therapy Goal  Outcome: Ongoing (see interventions/notes)  Goal: Dysphagia Goals, SLP  Description: Stand Alone Therapy Goal  Outcome: Ongoing (see interventions/notes)  Goal: Eating Self-Feeding Goal  Description: Stand Alone Therapy Goal  Outcome: Ongoing (see interventions/notes)  Goal: Gait Training Goal  Description: Stand Alone Therapy Goal  Outcome: Ongoing (see interventions/notes)  Goal: Grooming  Goal  Description: Stand Alone Therapy Goal  Outcome: Ongoing (see interventions/notes)  Goal: Home Management Goal  Description: Stand Alone Therapy Goal  Outcome: Ongoing (see interventions/notes)  Goal: Interprofessional Goal  Description: Stand Alone Therapy Goal  Outcome:  Ongoing (see interventions/notes)  Goal: LB Dressing Goal  Description: Stand Alone Therapy Goal  Outcome: Ongoing (see interventions/notes)  Goal: Occupational Therapy Goals  Description: Stand Alone Therapy Goal  Outcome: Ongoing (see interventions/notes)  Goal: Physical Therapy Goal  Description: Stand Alone Therapy Goal  Outcome: Ongoing (see interventions/notes)  Goal: Range of Motion Goal  Description: Stand Alone Therapy Goal  Outcome: Ongoing (see interventions/notes)  Goal: Strength Goal  Description: Stand Alone Therapy Goal  Outcome: Ongoing (see interventions/notes)  Goal: Toileting Goal  Description: Stand Alone Therapy Goal  Outcome: Ongoing (see interventions/notes)  Goal: Goal Transfer Training  Description: Stand Alone Therapy Goal  Outcome: Ongoing (see interventions/notes)  Goal: UB Dressing Goal  Description: Stand Alone Therapy Goal  Outcome: Ongoing (see interventions/notes)

## 2018-05-08 NOTE — Care Plan (Signed)
Pt weaned to ATC 28%, currently doing ATC all day and resting on QHS. Pt continues to suction for moderate/large amts creamy tan secretions. Pt ordered albuterol/hypertonic nebs TID and percussion/vibration via bed.

## 2018-05-08 NOTE — Care Management Notes (Signed)
[facility]  Care Management Note    Patient Name: Antonio Gillespie  Date of Birth: Dec 08, 1960  Sex: male  Date/Time of Admission: 04/28/2018  3:32 PM  Room/Bed: 04/A  Payor: OUTSOURCE PENDING MEDICAID / Plan: ELIGIBILITY PENDING MEDICAID / Product Type: Medicaid /    LOS: 10 days   Primary Care Providers:  Selinda Eon, MD, MD (General)    Admitting Diagnosis:  Scalp hematoma [S00.03XA]    Assessment:      05/08/18 1355   Assessment Details   Assessment Type Continued Assessment   Date of Care Management Update 05/08/18   Date of Next DCP Update 05/11/18   Care Management Plan   Discharge Planning Status plan in progress   Projected Discharge Date 05/12/18   Discharge Needs Assessment   Discharge Facility/Level of Care Needs SNF Placement (Medicare certified)(code 3)     PT/OT recommending SNF placement for discharge. Medicaid still pending.     Discharge Plan:  SNF Placement (Medicare certified) (code 3)    The patient will continue to be evaluated for developing discharge needs.     Case Manager: Placido Sou, SOCIAL WORKER  Phone: [phone]

## 2018-05-08 NOTE — Care Plan (Signed)
Medical Nutrition Therapy Follow Up        SUBJECTIVE : Pt on ATC, sitting in bed side chair, more alert today, tolerating tube feeds    OBJECTIVE:     Current Diet Order/Nutrition Support:   Osmolite 1.5   60 ml/hr plus 60 ml prosource/day   Provides: 2280 cal = 26 cals/kg                120 g protein = 1.7 g/kg                1097 ml free water -    Height Used for Calculations: 172.7 cm (5' 7.99")  Weight Used For Calculations: 87.5 kg (192 lb 14.4 oz)  Weight Trends: 3/23- 90.8 kg   BMI (kg/m2): 29.4  BMI Assessment: BMI 25-29.9: overweight  Ideal Body Weight (IBW) (kg): 70.87  % Ideal Body Weight: 123.47     Estimated Needs:    Energy Calorie Requirements: 2200-2600 cal per day (25-30kcals/87.5kg)  Protein Requirements (gms/day): 106-142 g prot per day (1.5-2g/70.8kg)    per day (-mLs/-kg)    Comments: 58 yo male s/p fall resulting in humerus fx, nasal fx, scalp hematoma s/p trach and PEG 3/19        Plan/Interventions :   Continue current TF.  Consider change current TF (Osmolite 1.5)  to bolus 6 cans / day   Continue prenatal vitamin  Monitor weekly weights.   Will follow.       Nutrition Diagnosis: Swallowing difficulty related to decreased mental status as evidenced by Need for TF (in progress)    Seward Speck, RD, LD, CNSC 05/08/2018, 12:51  Pager [phone]

## 2018-05-09 ENCOUNTER — Inpatient Hospital Stay (HOSPITAL_COMMUNITY): Payer: Medicaid Other

## 2018-05-09 DIAGNOSIS — S0003XA Contusion of scalp, initial encounter: Secondary | ICD-10-CM

## 2018-05-09 DIAGNOSIS — Z01818 Encounter for other preprocedural examination: Secondary | ICD-10-CM

## 2018-05-09 LAB — BASIC METABOLIC PANEL
ANION GAP: 5 mmol/L (ref 4–13)
ANION GAP: 7 mmol/L (ref 4–13)
BUN/CREA RATIO: 19 (ref 6–22)
BUN/CREA RATIO: 16 (ref 6–22)
BUN: 10 mg/dL (ref 8–25)
BUN: 9 mg/dL (ref 8–25)
CALCIUM: 7.8 mg/dL — ABNORMAL LOW (ref 8.5–10.2)
CALCIUM: 7.8 mg/dL — ABNORMAL LOW (ref 8.5–10.2)
CHLORIDE: 98 mmol/L (ref 96–111)
CHLORIDE: 96 mmol/L (ref 96–111)
CO2 TOTAL: 33 mmol/L — ABNORMAL HIGH (ref 22–32)
CO2 TOTAL: 32 mmol/L (ref 22–32)
CREATININE: 0.52 mg/dL — ABNORMAL LOW (ref 0.62–1.27)
CREATININE: 0.55 mg/dL — ABNORMAL LOW (ref 0.62–1.27)
ESTIMATED GFR: >60 mL/min/1.73mˆ2 (ref >60)
ESTIMATED GFR: >60 mL/min/1.73mˆ2 (ref >60)
GLUCOSE: 150 mg/dL — ABNORMAL HIGH (ref 65–139)
GLUCOSE: 152 mg/dL — ABNORMAL HIGH (ref 65–139)
POTASSIUM: 3.8 mmol/L (ref 3.5–5.1)
POTASSIUM: 3.7 mmol/L (ref 3.5–5.1)
SODIUM: 136 mmol/L (ref 136–145)
SODIUM: 135 mmol/L — ABNORMAL LOW (ref 136–145)

## 2018-05-09 LAB — CBC
HCT: 24.8 % — ABNORMAL LOW (ref 38.9–52.0)
HGB: 7.4 g/dL — ABNORMAL LOW (ref 13.4–17.5)
MCH: 26.7 pg (ref 26.0–32.0)
MCHC: 29.8 g/dL — ABNORMAL LOW (ref 31.0–35.5)
MCV: 89.5 fL (ref 78.0–100.0)
MPV: 12.1 fL (ref 8.7–12.5)
PLATELETS: 344 x10ˆ3/uL (ref 150–400)
RBC: 2.77 10*6/uL — ABNORMAL LOW (ref 4.50–6.10)
RBC: 2.77 x10ˆ6/uL — ABNORMAL LOW (ref 4.50–6.10)
RDW-CV: 21 % — ABNORMAL HIGH (ref 11.5–15.5)
WBC: 8 10*3/uL (ref 3.7–11.0)

## 2018-05-09 LAB — POC BLOOD GLUCOSE (RESULTS)
GLUCOSE, POC: 149 mg/dL — ABNORMAL HIGH (ref 70–105)
GLUCOSE, POC: 152 mg/dL — ABNORMAL HIGH (ref 70–105)
GLUCOSE, POC: 163 mg/dL — ABNORMAL HIGH (ref 70–105)
GLUCOSE, POC: 163 mg/dl — ABNORMAL HIGH (ref 70–105)
GLUCOSE, POC: 171 mg/dL — ABNORMAL HIGH (ref 70–105)

## 2018-05-09 LAB — URINE CULTURE
URINE CULTURE: 104 mmol/L (ref 101–111)
URINE CULTURE: NO GROWTH

## 2018-05-09 LAB — PHOSPHORUS: PHOSPHORUS: 3.5 mg/dL (ref 2.4–4.7)

## 2018-05-09 LAB — MAGNESIUM: MAGNESIUM: 2 mg/dL (ref 1.6–2.6)

## 2018-05-09 MED ORDER — AMOXICILLIN 200 MG-POTASSIUM CLAVULANATE 28.5 MG/5 ML ORAL SUSPENSION
875.00 mg | INHALATION_SUSPENSION | Freq: Two times a day (BID) | ORAL | Status: AC
Start: 2018-05-09 — End: 2018-05-13
  Administered 2018-05-09 – 2018-05-13 (×9): 875 mg via GASTROSTOMY
  Filled 2018-05-09 (×11): qty 21.9

## 2018-05-09 MED ORDER — ALBUTEROL SULFATE CONCENTRATE 2.5 MG/0.5 ML SOLUTION FOR NEBULIZATION
2.50 mg | INHALATION_SOLUTION | Freq: Three times a day (TID) | RESPIRATORY_TRACT | Status: DC
Start: 2018-05-09 — End: 2018-05-11
  Administered 2018-05-09 – 2018-05-11 (×6): 2.5 mg via RESPIRATORY_TRACT
  Filled 2018-05-09: qty 1
  Filled 2018-05-09 (×2): qty 3
  Filled 2018-05-09 (×2): qty 1

## 2018-05-09 MED ORDER — FUROSEMIDE 10 MG/ML INJECTION SOLUTION
20.0000 mg | Freq: Once | INTRAMUSCULAR | Status: AC
Start: 2018-05-09 — End: 2018-05-09
  Administered 2018-05-09: 20 mg via INTRAVENOUS
  Filled 2018-05-09: qty 2

## 2018-05-09 MED ORDER — ALBUTEROL SULFATE CONCENTRATE 2.5 MG/0.5 ML SOLUTION FOR NEBULIZATION
2.50 mg | INHALATION_SOLUTION | Freq: Four times a day (QID) | RESPIRATORY_TRACT | Status: DC
Start: 2018-05-09 — End: 2018-05-09

## 2018-05-09 MED ORDER — AMOXICILLIN 875 MG-POTASSIUM CLAVULANATE 125 MG TABLET
1.00 | ORAL_TABLET | Freq: Two times a day (BID) | ORAL | Status: DC
Start: 2018-05-09 — End: 2018-05-09

## 2018-05-09 MED ORDER — AMOXICILLIN 875 MG-POTASSIUM CLAVULANATE 125 MG TABLET
1.0000 | ORAL_TABLET | Freq: Two times a day (BID) | ORAL | Status: DC
Start: 2018-05-09 — End: 2018-05-09

## 2018-05-09 MED ORDER — IPRATROPIUM 0.5 MG-ALBUTEROL 3 MG (2.5 MG BASE)/3 ML NEBULIZATION SOLN
3.00 mL | INHALATION_SOLUTION | Freq: Three times a day (TID) | RESPIRATORY_TRACT | Status: DC
Start: 2018-05-09 — End: 2018-05-11
  Administered 2018-05-09 – 2018-05-11 (×6): 3 mL via RESPIRATORY_TRACT
  Filled 2018-05-09: qty 9
  Filled 2018-05-09: qty 3
  Filled 2018-05-09: qty 30
  Filled 2018-05-09 (×2): qty 3

## 2018-05-09 MED ORDER — IPRATROPIUM 0.5 MG-ALBUTEROL 3 MG (2.5 MG BASE)/3 ML NEBULIZATION SOLN
3.00 mL | INHALATION_SOLUTION | Freq: Three times a day (TID) | RESPIRATORY_TRACT | Status: DC
Start: 2018-05-09 — End: 2018-05-09

## 2018-05-09 MED ORDER — ALBUMIN, HUMAN 5 % INTRAVENOUS SOLUTION
INTRAVENOUS | Status: DC
Start: 2018-05-09 — End: 2018-05-09
  Filled 2018-05-09: qty 500

## 2018-05-09 MED ADMIN — sodium chloride 0.9 % (flush) injection syringe: @ 06:00:00

## 2018-05-09 MED ADMIN — ipratropium bromide 0.02 % solution for inhalation: RESPIRATORY_TRACT | @ 20:00:00

## 2018-05-09 MED ADMIN — ipratropium 0.5 mg-albuteroL 3 mg (2.5 mg base)/3 mL nebulization soln: RESPIRATORY_TRACT | @ 12:00:00

## 2018-05-09 MED ADMIN — polyethylene glycol 3350 17 gram oral powder packet: GASTROSTOMY | @ 08:00:00

## 2018-05-09 MED ADMIN — insulin regular human 100 unit/mL injection solution: SUBCUTANEOUS | @ 12:00:00

## 2018-05-09 MED ADMIN — sodium chloride 0.9 % (flush) injection syringe: GASTROSTOMY | @ 21:00:00

## 2018-05-09 MED ADMIN — sodium chloride 0.9 % (flush) injection syringe: GASTROSTOMY | @ 12:00:00

## 2018-05-09 MED ADMIN — ketamine 50 mg/mL injection solution: GASTROSTOMY | @ 08:00:00

## 2018-05-09 MED ADMIN — sodium chloride 0.9 % (flush) injection syringe: RESPIRATORY_TRACT | @ 05:00:00

## 2018-05-09 MED ADMIN — albuterol sulfate concentrate 2.5 mg/0.5 mL solution for nebulization: GASTROSTOMY | @ 12:00:00

## 2018-05-09 MED ADMIN — acetaminophen 325 mg tablet: RESPIRATORY_TRACT | @ 12:00:00

## 2018-05-09 MED ADMIN — perflutren lipid microspheres 1.1 mg/mL intravenous suspension: GASTROSTOMY | @ 21:00:00

## 2018-05-09 MED ADMIN — HYDROmorphone 2 mg/mL injection syringe: GASTROSTOMY | @ 08:00:00

## 2018-05-09 NOTE — Care Plan (Signed)
[facility]  Rehabilitation Services  Physical Therapy Progress Note      Patient Name: Antonio Gillespie  Date of Birth: 10/10/1960  Height:  172.7 cm (5\' 8" )  Weight:  88.3 kg (194 lb 10.7 oz)  Room/Bed: 04/A  Payor: [employer]SOURCE PENDING MEDICAID / Plan: ELIGIBILITY PENDING MEDICAID / Product Type: Medicaid /     Assessment:     Pt tolerated PT treatment fair.  Pt with deficits in strength in RLE and BUE.  Pt with pain in BUE; L more than R.  Pt would benefit from SNF placement to [facility] to return to PLOF and independence.  If patient would be d/c'd to home against professional recommendation, pt would require 24/7 assistance of 2, w/c, mechanical lift and hospital bed and home health PT.    Discharge Needs:   Equipment Recommendation: TBD (see above)    Discharge Disposition: skilled nursing facility    JUSTIFICATION OF DISCHARGE RECOMMENDATION   Based on current diagnosis, functional performance prior to admission, and current functional performance, this patient requires continued PT services in skilled nursing facility in order to achieve significant functional improvements in these deficit areas: aerobic capacity/endurance, ergonomics and body mechanics, gait, locomotion, and balance, integumentary integrity, joint integrity and mobility, motor function, muscle performance, neuromotor development and sensory integration, neuromuscular, posture, ROM (range of motion), ventilation and respiration/gas exchange.      Plan:   Continue to follow patient according to established plan of care.  The risks/benefits of therapy have been discussed with the patient/caregiver and he/she is in agreement with the established plan of care.     Subjective & Objective:        05/09/18 2263   Therapist Pager   PT Assigned/ Pager # Lyla Son [phone]   Rehab Session   Document Type therapy progress note (daily note)   Total PT Minutes: 10   Patient Effort good   Symptoms Noted During/After Treatment none   General  Information   Patient Profile Reviewed yes   Medical Lines PIV Line;Telemetry;Foley Catheter   Respiratory Status aerosol trach collar   Existing Precautions/Restrictions fall precautions;full code;oxygen therapy device and L/min   Mutuality/Individual Preferences   Individualized Care Needs AAROM all extremities; up to chair with maxi-move   Pre Treatment Status   Pre Treatment Patient Status Patient sitting in bedside chair or w/c;Call light within reach;Sitter select activated   Support Present Pre Treatment  None   Communication Pre Treatment  Nurse   Communication Pre Treatment Comment approved PT session   Cognitive Assessment/Interventions   Behavior/Mood Observations alert   Orientation Status oriented x 4   Attention severe impairment   Follows Commands follows one step commands   Vital Signs   Vitals Comment vitals stable   Pain Assessment   Pre/Posttreatment Pain Comment grimmaced with movement of BUE   Motor Skills/Interventions   Additional Documentation Therapeutic Exercise (Group)   Therapeutic Exercise/Activity   Upper Extremity Range of Motion left;shoulder flexion/extension;bilateral;elbow flexion/extension;wrist flexion/extension   General Exercise (Hand, Therapeutic Exercise) bilateral;finger flexion/extension;grip strengthening   Lower Extremity ankle pumps;gastroc stretch, bilateral;hamstring stretch, bilateral;LAQ (long arc quad), bilateral   Lower Extremity Range of Motion bilateral;hip flexion/extension   Exercise Type AAROM (active assistive range of motion);AROM (active range of motion)  (UE and LE respectively)   Position  seated   Sets/Reps  2/5   Functional Endurance Training   Comment, Functional Endurance Poor   Post Treatment Status   Post Treatment Patient Status Patient sitting  in bedside chair or w/c;Call light within reach;Telephone within reach   Support Present Post Treatment  None   Communication Post Treatement Nurse   Communication Post Treatment Comment Pt performance      Plan of Care Review   Plan Of Care Reviewed With patient   Basic Mobility Am-PAC/6Clicks Score   Turning in bed without bedrails 1   Lying on back to sitting on edge of flat bed 1   Moving to and from a bed to a chair 1   Standing up from chair 1   Walk in room 1   Climbing 3-5 steps with railing 1   6 Clicks Raw Score total 6   Standardized (t-scale) score 16.59   CMS 0-100% Score 100   CMS Modifier CN   Physical Therapy Clinical Impression   Assessment Pt tolerated PT treatment fair.  Pt with deficits in strength in RLE and BUE.  Pt with pain in BUE; L more than R.  Pt would benefit from SNF placement to [facility] to return to PLOF and independence.  If patient would be d/c'd to home against professional recommendation, pt would require 24/7 assistance of 2, w/c, mechanical lift and hospital bed and home health PT.   Anticipated Equipment Needs at Discharge (PT) TBD   Anticipated Discharge Disposition skilled nursing facility       Therapist:   Rudell Cobb, PT   Pager #: [phone]

## 2018-05-09 NOTE — Progress Notes (Signed)
[facility]                                               SICU PROGRESS NOTE    Antonio Gillespie, Antonio Gillespie  Date of Admission:  04/28/2018  Date of Service: 05/09/2018  Date of Birth:  04-13-1960    Primary Attending:  [name]  Primary Service:  Trauma Blue    Post Op Day:6 Days Post-Op S/P Procedure(s) (LRB):  TRACHEOSTOMY (N/A)  GASTROSCOPY WITH PLACEMENT PEG TUBE (N/A)     LOS: 11 days      Subjective:    NAEO. Rested on the vent at 5/10 PS.    Vital Signs:  Temp (24hrs) Max:37.6 C (83.1 F)      Systolic (51VOH), YWV:371 , Min:98 , GGY:694     Diastolic (85IOE), VOJ:50, Min:58, Max:114    Temp  Avg: 37.2 C (99 F)  Min: 36.8 C (98.2 F)  Max: 37.6 C (99.7 F)  MAP (Non-Invasive)  Avg: 88.8 mmHG  Min: 75 mmHG  Max: 119 mmHG  Pulse  Avg: 99.7  Min: 95  Max: 105  Resp  Avg: 30.1  Min: 22  Max: 40  SpO2  Avg: 96.9 %  Min: 90 %  Max: 100 %  Pain Score (Numeric, Faces): Other    Meds  No current outpatient medications on file.     acetaminophen (TYLENOL) 160 mg per 5 mL liquid - grape flavored, 650 mg, Gastric (NG, OG, PEG, GT), Q4H PRN  albuterol (PROVENTIL) 2.57m/ 0.5 mL nebulizer solution, 2.5 mg, Nebulization, 3x/day    And  sodium chloride (7% SALINE) nebulizer solution, 4 mL, Nebulization, 3x/day  aspirin chewable tablet 81 mg, 81 mg, Gastric (NG, OG, PEG, GT), Daily  ceFAZolin (ANCEF) 2 g in D5W 50 mL IVPB, 2 g, Intravenous, Q8H  chlorhexidine gluconate (PERIDEX) 0.12% mouthwash, 15 mL, Swish & Spit, 2x/day  enoxaparin PF (LOVENOX) 60 mg/0.6 mL SubQ injection, 0.5 mg/kg, Subcutaneous, Q12H  famotidine (PEPCID) tablet, 20 mg, Gastric (NG, OG, PEG, GT), 2x/day  gabapentin (NEURONTIN) 3068mper 16m38mral liquid, 300 mg, Gastric (NG, OG, PEG, GT), 3x/day  haloperidol (HALDOL) 5 mg/mL injection, 5 mg, Intravenous, Q6H PRN  lanolin-oxyquin-pet, hydrophil (BAG BALM) topical ointment, , Apply Topically, 2x/day PRN  melatonin tablet, 6 mg, Gastric (NG, OG, PEG, GT), NIGHTLY  NS flush syringe, 2 mL,  Intracatheter, Q8HRS    And  NS flush syringe, 2-6 mL, Intracatheter, Q1 MIN PRN  nutrition protein supplement 15 g per 30 mL packet, 2 Packet, Gastric (NG, OG, PEG, GT), Daily  nystatin (NYSTOP) 100,000 units/g topical powder, , Apply Topically, 2x/day  polyethylene glycol (MIRALAX) oral packet, 17 g, Gastric (NG, OG, PEG, GT), Daily  prenatal vitamin-iron-folic acid tablet, 1 Tab, Gastric (NG, OG, PEG, GT), Daily  propranolol (INDERAL) tablet, 10 mg, Gastric (NG, OG, PEG, GT), 3x/day  senna concentrate (SENNA) 528m8mr 15mL7ml liquid, 5 mL, Gastric (NG, OG, PEG, GT), 2x/day  SSIP insulin R human (HUMULIN R) 100 units/mL injection, 0-12 Units, Subcutaneous, Q4H PRN        Physical Exam:   GEN:cKXF:GHWEXHBZJIR frail male appears older than stated age   HEENT:Pupils 16mm b44mterally,large posterior scalp hematoma  NECK:Trachea midline, tracheostomy in place  PULM:Diminished breath sounds bilaterally, course, Tracheostomy in place   CARDIAC:Afib   CHEST::No abrasions or contusions  ABD:AbCVE:LFYBOFB  soft, and nondistended  PELVIS:No evidence of injury  GU/RECTAL:large scrotal edema resolving   BACK:No evidence of injury and no step-off  YD:XAJOIN noted on bilateral knees and feet  NEURO:Intubated, following commands  GLASGOW COMA SCALE:Eye opening:4 spontaneous, Verbal response:Trachostomy , Best motor response:6 obeys commands  VASCULAR:All pulses palpable and equal bilaterally  INTEGUMENTARY:Pink, warm, and dry andanasarca      Labs:  I have reviewed all lab results.  Hgb stable: 7.4  Electrolytes WNL      Recent Imaging:  None      Assessment/ Plan:  Active Hospital Problems    Diagnosis   . Primary Problem: Fall   . COPD (chronic obstructive pulmonary disease) (CMS HCC)   . Thrombocytopenia Unspecified   . Hypokalemia   . Alcohol abuse   . Troponin I above reference range   . Acute respiratory failure (CMS HCC)   . Nasal fracture   . Abrasions of multiple sites   . Scalp hematoma   .  Humeral fracture   . UTI (urinary tract infection)   . Left inguinal hernia   . Anasarca       Antonio Gillespie is a 58 y.o. male who is 6 Days Post-Op S/P Procedure(s) (LRB):  TRACHEOSTOMY (N/A)  GASTROSCOPY WITH PLACEMENT PEG TUBE (N/A)      NEURO:  GCS: E4=Spontaneous (Opens Eyes on Own) M6=Normal (Follows Simple Commands) V1=None (Makes No Noise or Intubated)  Sedation/analgesia:    Gabapentin TID    Tylenol PRN   Haldol PRN - Not needed 2 days  Melatonin  Prenatal vitamin    CARDIOVASCULAR:  Systolic (86VEH), MCN:470 , Min:98 , JGG:836     Diastolic (62HUT), MLY:65, Min:58, Max:114     ART-Line  MAP: 85 mmHg   Troponins: No results found for: CKMB    MAP >80  HR 90-101  Meds: aspirin, propanolol  Duplex of left edematous arm yesterday with SVT    PULMONARY:  Airway Ventilator Settings   Tracheostomy 8 Cuffed (Active)   Trach Status Secured 05/08/2018  3:37 AM   Site Condition Intact 05/07/2018  8:00 PM   [place] Cleaned 05/07/2018  8:00 PM   [place] @ Bedside? Yes 05/07/2018  8:00 PM   Bilateral General Breath Sounds Coarse;Expiratory Wheezes 05/07/2018  8:00 PM    Ventilator Settings:  Mode: SPONT  Set PEEP: 5 cmH2O  Pressure Support: 10 cmH2O  FiO2: 30 %  I:E Ratio: 1:2.1   SpO2  Avg: 96.9 %  Min: 90 %  Max: 100 %  Blood Gas:  No results found for this encounter  Nebs: albuterol TID, sodium chloride TID  BAL 3/21--> 30,000 S. Aureus, <100 GNR  Tolerated ATC during day and 4 hours of the night  Continued thick secretions around trach site         GI:  MNT PROTOCOL FOR DIETITIAN  ADULT TUBE FEEDING - CONTINUOUS DRIP CONTINUOUS NO MEALS, TF ONLY; OSMOLITE 1.5; OG; Initial Rate (ml/hr): 20; Increase by: 10 ml every 6 hours; Goal Rate (ml/hr): 60  DIET NPO - NOW EXCEPT TUBE FEEDS   Recent Labs     05/08/18  0540   BILIRUBIN Negative     Last BM: Last Bowel Movement: 05/08/18(per report)  Proph: Senokot, Pepcid, miralax    RENAL/GU:  Recent Labs     05/08/18  0053 05/08/18  0540 05/08/18  1507 05/09/18  0028    SODIUM 133*  --  135* 136   POTASSIUM 3.6  --  3.6 3.8   CHLORIDE 98  --  97 98   BUN 9  --  10 10   CREATININE 0.50*  --  0.50* 0.52*   GLUCOSE  --  Negative  --   --    ANIONGAP 6  --  6 5   CALCIUM 7.5*  --  7.2* 7.8*   MAGNESIUM 1.9  --  1.9 2.0   PHOSPHORUS 3.6  --  3.8 3.5       I/O last 24 hours:      Intake/Output Summary (Last 24 hours) at 05/09/2018 0557  Last data filed at 05/09/2018 0400  Gross per 24 hour   Intake 1730 ml   Output 2347 ml   Net -617 ml     I/O current shift:  03/23 1900 - 03/24 0659  In: 730 [I.V.:130; GT:600]  Out: 575 [Urine:575]  Foley in critically ill pt for strict I/O's  Urine output 70,70, 100 last 3 hours  Patient diuresed yesterday w 20 mg lasix  UA does not show evidence of infection     HEME:  Recent Labs     05/07/18  0118 05/08/18  0053 05/09/18  0028   HGB 7.2* 7.5* 7.4*   HCT 24.0* 24.7* 24.8*   PLTCNT 269 284 344     Transfusions:   Proph: SCD's, Lovenox     ID:  Temp (24hrs) Max:37.6 C (99.7 F)    Recent Labs     05/07/18  0118 05/08/18  0053 05/09/18  0028   WBC 8.5 8.5 8.0     Febrile    Cultures:  ABX: Deescalated to cefepime yesterday  Day 3 of ABx starting 3/22  New blood cultures drawn   3/23 - pending  G+C negative    ENDO:  SSI    MSK:  No skin breakdown    OTHER:  Lines: peripheral IV x2, foley, gastrostomy tube  Activity: Bedrest, HOB 30deg  PT/OT:  SNF  MNT:  ordered    PLAN:  Continue to wean to 24 hour ATC.   Ancef for MSSA pneumonia coverage  Goal tube feeds    Mikki Santee, MD  05/09/2018, 05:57      I saw and examined the patient.  I reviewed the resident's note.  I agree with the findings and plan of care as documented in the resident's note.  Any exceptions/additions are edited/noted.    SP fall with humerus fx, acute resp failure requiring intubation and tracheostomy, inanition requiring peg    Main issues are ventilator wean. ATC during day and rests on PSV overnight. Has relatively thick secretions requiring freq suctioning and hypertonic nebs,  will work towards floor vent and transfer    CXR stable     Goal tube feeds    Hyponatremia 136 today (133 prior), improved  Goal 1L negative fluid balance, achieved with daily lasix.    BAL positive for MSSA changed to Ancef yesterday, will change to Augmentin to cover possible sinusitis    Critical Care Attestation    I was present at the bedside of this critically ill patient for 45 minutes exclusive of procedures.  This patient suffers from failure or dysfunction of Pulmonary, GI/Hepatopancreaticobiliary, Renal and Immue/Allergic system(s).  The care of this patient was in regard to managing (a) conditions(s) that has a high probability of sudden, clinically significant, or life-threatening deterioration and required a high degree of Attending Physician attention and direct involvement to intervene urgently. Data review and care planning  was performed in direct proximity of the patient, examination was obviously performed in direct contact with the patient. All of this time was exclusive of procedure which will be documented elsewhere in the chart.    My critical care time is independent and unique to other providers (no other providers saw patient for purposes of sicu evaluation)  My critical care time involved full attention to the patients' condition and included:    Review of nursing notes and/or old charts  Review of medications, allergies, and vital signs  Documentation time  Consultant collaboration on findings and treatment options  Care, transfer of care, and discharge plans  Ordering, interpreting, and reviewing diagnostic studies/tab tests  Obtaining necessary history from family, EMS, nursing home staff and/or treating physicians    My critical care time did not include time spent teaching resident physician(s) or other services of resident physicians, or performing other reported procedures.  Total Critical Care Time: 45 minutes      Herschell Dimes, MD

## 2018-05-09 NOTE — Care Plan (Signed)
Pt wore vent t/o the night to rest +5/10 PS, 30%. He wears ATC 10L, 28% t/o the day. Suctioning moderate amount of secretions. He was albuterol and hypertonic ordered TID. Will continue to monitor

## 2018-05-09 NOTE — Respiratory Therapy (Signed)
Respiratory care plan: Patient to ATC during the day as tolerated and to Invasive BiPAP qhs for rest and prn.  Patient is at a high risk for decompensation and should remain resting on ps ventilation @ night.  Patient has a copious amount of thin foamy secretions that appear to be draining from above.

## 2018-05-09 NOTE — Progress Notes (Signed)
Antonio Gillespie                                                      Trauma Progress Note                 Date of Birth:  10-05-1960  Date of Admission:  04/28/2018  Date of service: 05/09/2018    Antonio Gillespie, 58 y.o., male Post trauma day 11 status post Fall      Events over the last 24 hours have included:  ATC throughout the day, ventilator at night    Subjective:    No acute events overnight. Tolerated ATC throughout the day yesterday. Up in chair this morning, eyes open spontaneously, following simple commands, nods his head in response to questions. Denies abdominal pain.        Objective   24 Hour Summary:    Filed Vitals:    05/09/18 1000 05/09/18 1025 05/09/18 1100 05/09/18 1200   BP:  127/74 121/64 98/63   Pulse:  (!) 101 98 100   Resp:  (!) 35 (!) 37 (!) 33   Temp:       SpO2: 98% 91% 97% 93%     Labs:  Recent Labs     05/07/18  0118 05/08/18  0053 05/08/18  0540 05/08/18  1507 05/09/18  0028   WBC 8.5 8.5  --   --  8.0   HGB 7.2* 7.5*  --   --  7.4*   HCT 24.0* 24.7*  --   --  24.8*   SODIUM 136 133*  --  135* 136   POTASSIUM 3.9 3.6  --  3.6 3.8   CHLORIDE 102 98  --  97 98   BUN 9 9  --  10 10   CREATININE 0.49* 0.50*  --  0.50* 0.52*   GLUCOSE  --   --  Negative  --   --    ANIONGAP 7 6  --  6 5   CALCIUM 7.5* 7.5*  --  7.2* 7.8*   MAGNESIUM 1.9 1.9  --  1.9 2.0   PHOSPHORUS 3.1 3.6  --  3.8 3.5       Intake/Output Summary (Last 24 hours) at 05/09/2018 1240  Last data filed at 05/09/2018 1200  Gross per 24 hour   Intake 1510 ml   Output 1605 ml   Net -95 ml     Nutrition Management: MNT PROTOCOL FOR DIETITIAN  ADULT TUBE FEEDING - CONTINUOUS DRIP CONTINUOUS NO MEALS, TF ONLY; OSMOLITE 1.5; OG; Initial Rate (ml/hr): 20; Increase by: 10 ml every 6 hours; Goal Rate (ml/hr): 60  DIET NPO - NOW EXCEPT TUBE FEEDS Last Bowel Movement: 05/08/18  No results for input(s): ALBUMIN, PREALBUMIN in the last 72 hours.  Current Medications:    Current Facility-Administered Medications:   .  acetaminophen  (TYLENOL) 160 mg per 5 mL liquid - grape flavored, 650 mg, Gastric (NG, OG, PEG, GT), Q4H PRN, Thimm, Terra, MD, 650 mg at 05/09/18 1206  .  ipratropium-albuterol 0.5 mg-3 mg(2.5 mg base)/3 mL Solution for Nebulization, 3 mL, Nebulization, 3x/day, 3 mL at 05/09/18 1151 **AND** albuterol (PROVENTIL) 2.13m/ 0.5 mL nebulizer solution, 2.5 mg, Nebulization, 3x/day, GHerschell Dimes MD, 2.5 mg at 05/09/18 1151  .  amoxicillin-clavulanate (AUGMENTIN) 200-28.5 mg per  5 mL oral liquid, 875 mg, Gastric (NG, OG, PEG, GT), 2x/day, Herschell Dimes, MD, 875 mg at 05/09/18 1207  .  aspirin chewable tablet 81 mg, 81 mg, Gastric (NG, OG, PEG, GT), Daily, McCoy, Skyler, MD, 81 mg at 05/09/18 0824  .  chlorhexidine gluconate (PERIDEX) 0.12% mouthwash, 15 mL, Swish & Spit, 2x/day, Harvin Hazel, Anderson Malta, MD, 15 mL at 05/09/18 [phone]  .  enoxaparin PF (LOVENOX) 60 mg/0.6 mL SubQ injection, 0.5 mg/kg, Subcutaneous, Q12H, Ann Held, MD, 50 mg at 05/09/18 0827  .  famotidine (PEPCID) tablet, 20 mg, Gastric (NG, OG, PEG, GT), 2x/day, Harvin Hazel, Anderson Malta, MD, 20 mg at 05/09/18 2500  .  gabapentin (NEURONTIN) 375m per 66moral liquid, 300 mg, Gastric (NG, OG, PEG, GT), 3x/day, BaAnn HeldMD, 300 mg at 05/09/18 0823  .  haloperidol (HALDOL) 5 mg/mL injection, 5 mg, Intravenous, Q6H PRN, BoVerlon AuMD, 5 mg at 05/07/18 0135  .  lanolin-oxyquin-pet, hydrophil (BAG BALM) topical ointment, , Apply Topically, 2x/day PRN, BaMarquette OldMD  .  melatonin tablet, 6 mg, Gastric (NG, OG, PEG, GT), NIGHTLY, Thimm, Terra, MD, 6 mg at 05/08/18 2122  .  INSERT & MAINTAIN PERIPHERAL IV ACCESS, , , UNTIL DISCONTINUED **AND** DRESSING CHANGE [place] Lock; DRY STERILE, , , PRN **AND** NS flush syringe, 2 mL, Intracatheter, Q8HRS, Stopped at 05/09/18 0600 **AND** NS flush syringe, 2-6 mL, Intracatheter, Q1 MIN PRN, ChBoyd KerbsHansol, MD  .  nutrition protein supplement 15 g per 30 mL packet, 2 Packet, Gastric (NG, OG, PEG, GT), Daily, BaMarquette Old MD, 30 g at 05/09/18 0824  .  nystatin (NYSTOP) 100,000 units/g topical powder, , Apply Topically, 2x/day, BaMarquette OldMD  .  polyethylene glycol (MIRALAX) oral packet, 17 g, Gastric (NG, OG, PEG, GT), Daily, SpDimple NanasMD, 17 g at 05/09/18 [id number].  prenatal vitamin-iron-folic acid tablet, 1 Tab, Gastric (NG, OG, PEG, GT), Daily, BaAnn HeldMD, 1 Tab at 05/09/18 [phone].  propranolol (INDERAL) tablet, 10 mg, Gastric (NG, OG, PEG, GT), 3x/day, McCoy, Skyler, MD, 10 mg at 05/09/18 0826  .  senna concentrate (SENNA) 52814mer 31m31mal liquid, 5 mL, Gastric (NG, OG, PEG, GT), 2x/day, KnigHarvin HazelnnAnderson Malta, 176 mg at 05/09/18 [id number] SSIP insulin R human (HUMULIN R) 100 units/mL injection, 0-12 Units, Subcutaneous, Q4H PRN, 2 Units at 05/09/18 1215 **AND** POCT WHOLE BLOOD GLUCOSE, , , Q6H, BootVerlon Au     Today's Physical Exam:  GEN:   NAD and in bedside chair  HEENT:   Normocephalic; lip with eschar over left upper region        NECK:   Trach in place  PULM:   Non-labored respirations  CV:   Tachycardic  ABD:   Abdomen soft, nontender, and nondistended. Left inguinal hernia present, but reducible  NEURO: Eyes open spontaneously, follows simple commands   Integumentary:  Pink, warm, and dry    CXR: Improved aeration of bilateral lung bases, decreased pleural fluid    Assessment:  Active Hospital Problems   (*Primary Problem)    Diagnosis   . *Fall     "found down" and taken to outside hospital     . COPD (chronic obstructive pulmonary disease) (CMS HCC)     Chronic   . Thrombocytopenia Unspecified   . Hypokalemia   . Alcohol abuse   . Troponin I above reference range   . Acute respiratory failure (CMS HCC)   . Nasal fracture  Acute vs chronic     . Abrasions of multiple sites   . Scalp hematoma   . Humeral fracture     nonop per ortho     . UTI (urinary tract infection)     At outside hospital. Repeat UA pending     . Left inguinal hernia     S/p reduction     . Anasarca      Plan:  Nutrition: MNT PROTOCOL FOR DIETITIAN  ADULT TUBE FEEDING - CONTINUOUS DRIP CONTINUOUS NO MEALS, TF ONLY; OSMOLITE 1.5; OG; Initial Rate (ml/hr): 20; Increase by: 10 ml every 6 hours; Goal Rate (ml/hr): 60  DIET NPO - NOW EXCEPT TUBE FEEDS diet, Last Bowel Movement: 05/08/18  Activity: As tolerated    -Continue ATC as tolerated   - Attempt to wean nighttime ventilator use   - Will convert to floor vent tonight  -Lovenox for DVT ppx  -BAL 3/21 with staph aureus   - Linezolid de-escalated to Ancef  -Follow up cultures from 3/23   - NGTD  -Tube feeds at goal    -Dispo - will need SNF when medically ready and off vent for 24 hours   - Possible transfer to floor tomorrow if tolerating floor vent    Jari Pigg, MD  05/09/2018, 12:41      Family Phone call    The designated family member was able to be reached by phone for updates.  An update of the patient's status was reviewed and all questions were answered.    Jari Pigg, MD  05/09/2018, 17:33      Trauma/Surgical Critical Care/Acute Care Surgery Staff  Late entry for 05/09/2018.  I saw and examined the patient.  I reviewed the Resident note .  I agree with their findings and plan of care as documented in their note.  Any exceptions/additions are edited/noted.  No events overnight.  Tracheostomy in place, tolerating aerosol trach mask, tolerating tube feeds.  Chronic wounds without evidence of infection.  Working towards utilizing a four event for limited duration nighttime ventilation and transferred to step-down.  Electronically Signed by:    Susa Loffler, DO, FACS  Associate Professor of Surgery  Trauma, Surgical Critical Care, and Acute Care Surgery  05/13/2018 13:57

## 2018-05-09 NOTE — Respiratory Therapy (Signed)
Pt continues on ATC   30 %  Pt has a very productive cough  Percuss and vibrate bed Q4 WHILE AWAKE   Will continue to monitor pt airway and oxygen requirements

## 2018-05-10 ENCOUNTER — Inpatient Hospital Stay (HOSPITAL_COMMUNITY): Payer: Medicaid Other

## 2018-05-10 DIAGNOSIS — S0003XA Contusion of scalp, initial encounter: Secondary | ICD-10-CM

## 2018-05-10 DIAGNOSIS — Z01818 Encounter for other preprocedural examination: Secondary | ICD-10-CM

## 2018-05-10 LAB — ADULT ROUTINE BLOOD CULTURE, SET OF 2 BOTTLES (BACTERIA AND YEAST)
BLOOD CULTURE, ROUTINE: NO GROWTH
BLOOD CULTURE, ROUTINE: NO GROWTH

## 2018-05-10 LAB — BASIC METABOLIC PANEL
ANION GAP: 6 mmol/L (ref 4–13)
BUN/CREA RATIO: 18 (ref 6–22)
BUN: 10 mg/dL (ref 8–25)
CALCIUM: 7.7 mg/dL — ABNORMAL LOW (ref 8.5–10.2)
CHLORIDE: 97 mmol/L (ref 96–111)
CO2 TOTAL: 32 mmol/L (ref 22–32)
CREATININE: 0.55 mg/dL — ABNORMAL LOW (ref 0.62–1.27)
ESTIMATED GFR: >60 mL/min/1.73mˆ2 (ref >60)
GLUCOSE: 145 mg/dL — ABNORMAL HIGH (ref 65–139)
POTASSIUM: 3.7 mmol/L (ref 3.5–5.1)
SODIUM: 135 mmol/L — ABNORMAL LOW (ref 136–145)

## 2018-05-10 LAB — POC BLOOD GLUCOSE (RESULTS)
GLUCOSE, POC: 136 mg/dL — ABNORMAL HIGH (ref 70–105)
GLUCOSE, POC: 146 mg/dL — ABNORMAL HIGH (ref 70–105)
GLUCOSE, POC: 152 mg/dL — ABNORMAL HIGH (ref 70–105)
GLUCOSE, POC: 167 mg/dL — ABNORMAL HIGH (ref 70–105)

## 2018-05-10 LAB — CBC
HCT: 23.3 % — ABNORMAL LOW (ref 38.9–52.0)
HGB: 7 g/dL — ABNORMAL LOW (ref 13.4–17.5)
MCH: 27 pg (ref 26.0–32.0)
MCHC: 30 g/dL — ABNORMAL LOW (ref 31.0–35.5)
MCV: 90 fL (ref 78.0–100.0)
MPV: 11.8 fL (ref 8.7–12.5)
PLATELETS: 324 x10ˆ3/uL (ref 150–400)
RBC: 2.59 x10ˆ6/uL — ABNORMAL LOW (ref 4.50–6.10)
RDW-CV: 20.4 % — ABNORMAL HIGH (ref 11.5–15.5)
WBC: 9.1 x10ˆ3/uL (ref 3.7–11.0)

## 2018-05-10 LAB — PHOSPHORUS: PHOSPHORUS: 3.5 mg/dL (ref 2.4–4.7)

## 2018-05-10 LAB — MAGNESIUM: MAGNESIUM: 2 mg/dL (ref 1.6–2.6)

## 2018-05-10 MED ORDER — GABAPENTIN 250 MG/5 ML ORAL SOLUTION
400.00 mg | Freq: Three times a day (TID) | ORAL | Status: DC
Start: 2018-05-10 — End: 2018-05-19
  Administered 2018-05-10 – 2018-05-19 (×28): 400 mg via GASTROSTOMY
  Filled 2018-05-10 (×29): qty 12

## 2018-05-10 MED ORDER — PROPRANOLOL 20 MG TABLET
20.00 mg | ORAL_TABLET | Freq: Three times a day (TID) | ORAL | Status: DC
Start: 2018-05-10 — End: 2018-05-11
  Administered 2018-05-10 (×2): 20 mg via GASTROSTOMY
  Filled 2018-05-10 (×4): qty 1

## 2018-05-10 MED ORDER — OXYCODONE 5 MG TABLET
5.0000 mg | ORAL_TABLET | ORAL | Status: DC | PRN
Start: 2018-05-10 — End: 2018-05-10
  Administered 2018-05-10: 5 mg via GASTROSTOMY
  Filled 2018-05-10: qty 1

## 2018-05-10 MED ORDER — ELECTROLYTE-A INTRAVENOUS SOLUTION BOLUS
500.0000 mL | Freq: Once | INTRAVENOUS | Status: AC
Start: 2018-05-10 — End: 2018-05-10
  Administered 2018-05-10: 500 mL via INTRAVENOUS
  Administered 2018-05-10: 0 mL via INTRAVENOUS

## 2018-05-10 MED ORDER — PERFLUTREN LIPID MICROSPHERES 1.1 MG/ML INTRAVENOUS SUSPENSION
2.00 mL | INTRAVENOUS | Status: DC
Start: 2018-05-10 — End: 2018-05-10

## 2018-05-10 MED ADMIN — famotidine 20 mg tablet: GASTROSTOMY | @ 08:00:00

## 2018-05-10 MED ADMIN — sodium chloride 0.9 % (flush) injection syringe: @ 21:00:00

## 2018-05-10 MED ADMIN — sodium chloride 0.9 % (flush) injection syringe: RESPIRATORY_TRACT | @ 12:00:00

## 2018-05-10 MED ADMIN — amoxicillin 200 mg-potassium clavulanate 28.5 mg/5 mL oral suspension: GASTROSTOMY | @ 08:00:00

## 2018-05-10 MED ADMIN — aspirin 81 mg chewable tablet: GASTROSTOMY | @ 08:00:00

## 2018-05-10 MED ADMIN — albuterol sulfate concentrate 2.5 mg/0.5 mL solution for nebulization: RESPIRATORY_TRACT | @ 20:00:00

## 2018-05-10 MED ADMIN — ipratropium 0.5 mg-albuteroL 3 mg (2.5 mg base)/3 mL nebulization soln: RESPIRATORY_TRACT | @ 20:00:00

## 2018-05-10 MED ADMIN — enoxaparin 60 mg/0.6 mL subcutaneous syringe: SUBCUTANEOUS | @ 21:00:00

## 2018-05-10 MED ADMIN — acetaminophen 325 mg/10.15 mL oral suspension: GASTROSTOMY | @ 03:00:00

## 2018-05-10 MED ADMIN — folic acid-vit B6-vit B12 2.2 mg-25 mg-0.5 mg tablet: GASTROSTOMY | @ 08:00:00

## 2018-05-10 MED ADMIN — chlorhexidine gluconate 0.12 % mouthwash: ORAL | @ 21:00:00

## 2018-05-10 MED ADMIN — ipratropium 0.5 mg-albuteroL 3 mg (2.5 mg base)/3 mL nebulization soln: RESPIRATORY_TRACT | @ 12:00:00

## 2018-05-10 MED ADMIN — propranoloL 20 mg tablet: GASTROSTOMY | @ 15:00:00

## 2018-05-10 MED ADMIN — diphenhydrAMINE 50 mg/mL injection solution: GASTROSTOMY | @ 08:00:00

## 2018-05-10 MED ADMIN — tapentadoL 50 mg tablet: SUBCUTANEOUS

## 2018-05-10 MED ADMIN — amiodarone 50 mg/mL intravenous solution: GASTROSTOMY | @ 08:00:00

## 2018-05-10 MED ADMIN — adenosine 3 mg/mL intravenous solution: GASTROSTOMY | @ 21:00:00

## 2018-05-10 MED ADMIN — oxyCODONE 5 mg tablet: TOPICAL | @ 21:00:00

## 2018-05-10 MED ADMIN — heparin lock flush (porcine) 100 unit/mL intravenous solution: RESPIRATORY_TRACT | @ 05:00:00

## 2018-05-10 MED ADMIN — sodium chloride 0.9 % (flush) injection syringe: @ 06:00:00

## 2018-05-10 NOTE — Respiratory Therapy (Signed)
Pt has been on ATC 30% FIO2 since early this am and tolerating well. Pt has been OOB to chair all morning and P&V via bed not possible. Pt has strong productive cough for moderate amount of thin light yellow secretions.  Will continue neb tx as ordered.  Pt will remain on BIPAP 16/8 and FIO2 30% QHS to rest and plan is to move pt to step down status.

## 2018-05-10 NOTE — Progress Notes (Signed)
[facility]                                                      Trauma Progress Note                 Date of Birth:  01/31/1961  Date of Admission:  04/28/2018  Date of service: 05/10/2018    Antonio Gillespie, 58 y.o., male Post trauma day 12 status post Fall      Events over the last 24 hours have included:  Tolerated ATC throughout most of the day.  He only required BiPAP for 10 hours overnight.      Subjective:    Patient denies any complaints.       Objective   24 Hour Summary:    Filed Vitals:    05/10/18 0800 05/10/18 0900 05/10/18 1000 05/10/18 1200   BP: 127/80 124/73 (!) 124/105    Pulse: (!) 112 (!) 103     Resp: (!) 33 (!) 30     Temp: 36.8 C (98.2 F)   37.1 C (98.8 F)   SpO2: 100% 100%       Labs:  Recent Labs     05/08/18  0053 05/08/18  0540 05/08/18  1507 05/09/18  0028 05/09/18  1603 05/10/18  0043   WBC 8.5  --   --  8.0  --  9.1   HGB 7.5*  --   --  7.4*  --  7.0*   HCT 24.7*  --   --  24.8*  --  23.3*   SODIUM 133*  --  135* 136 135* 135*   POTASSIUM 3.6  --  3.6 3.8 3.7 3.7   CHLORIDE 98  --  97 98 96 97   BUN 9  --  10 10 9 10    CREATININE 0.50*  --  0.50* 0.52* 0.55* 0.55*   GLUCOSE  --  Negative  --   --   --   --    ANIONGAP 6  --  6 5 7 6    CALCIUM 7.5*  --  7.2* 7.8* 7.8* 7.7*   MAGNESIUM 1.9  --  1.9 2.0  --  2.0   PHOSPHORUS 3.6  --  3.8 3.5  --  3.5       Intake/Output Summary (Last 24 hours) at 05/10/2018 1126  Last data filed at 05/10/2018 1100  Gross per 24 hour   Intake 2180 ml   Output 1965 ml   Net 215 ml     Nutrition Management: MNT PROTOCOL FOR DIETITIAN  ADULT TUBE FEEDING - CONTINUOUS DRIP CONTINUOUS NO MEALS, TF ONLY; OSMOLITE 1.5; OG; Initial Rate (ml/hr): 20; Increase by: 10 ml every 6 hours; Goal Rate (ml/hr): 60  DIET NPO - NOW EXCEPT TUBE FEEDS Last Bowel Movement: 05/10/18  No results for input(s): ALBUMIN, PREALBUMIN in the last 72 hours.  Current Medications:    Current Facility-Administered Medications:   .  acetaminophen (TYLENOL) 160 mg per 5 mL liquid -  grape flavored, 650 mg, Gastric (NG, OG, PEG, GT), Q4H PRN, Thimm, Terra, MD, 650 mg at 05/10/18 0314  .  ipratropium-albuterol 0.5 mg-3 mg(2.5 mg base)/3 mL Solution for Nebulization, 3 mL, Nebulization, 3x/day, 3 mL at 05/10/18 0508 **AND** albuterol (PROVENTIL) 2.5mg / 0.5 mL nebulizer solution, 2.5 mg,  Nebulization, 3x/day, Caryl Pina, MD, 2.5 mg at 05/10/18 7510  .  amoxicillin-clavulanate (AUGMENTIN) 200-28.5 mg per 5 mL oral liquid, 875 mg, Gastric (NG, OG, PEG, GT), 2x/day, Caryl Pina, MD, 875 mg at 05/10/18 0826  .  aspirin chewable tablet 81 mg, 81 mg, Gastric (NG, OG, PEG, GT), Daily, McCoy, Skyler, MD, 81 mg at 05/10/18 0829  .  chlorhexidine gluconate (PERIDEX) 0.12% mouthwash, 15 mL, Swish & Spit, 2x/day, Kasandra Knudsen, Victorino Dike, MD, 15 mL at 05/10/18 0829  .  enoxaparin PF (LOVENOX) 60 mg/0.6 mL SubQ injection, 0.5 mg/kg, Subcutaneous, Q12H, Babs Sciara, MD, 50 mg at 05/10/18 2585  .  famotidine (PEPCID) tablet, 20 mg, Gastric (NG, OG, PEG, GT), 2x/day, Kasandra Knudsen, Victorino Dike, MD, 20 mg at 05/10/18 0829  .  gabapentin (NEURONTIN) 300mg  per 60mL oral liquid, 400 mg, Gastric (NG, OG, PEG, GT), 3x/day, McCoy, Skyler, MD  .  haloperidol (HALDOL) 5 mg/mL injection, 5 mg, Intravenous, Q6H PRN, Carlynn Herald, MD, 5 mg at 05/07/18 0135  .  lanolin-oxyquin-pet, hydrophil (BAG BALM) topical ointment, , Apply Topically, 2x/day PRN, Michaelle Birks, MD  .  melatonin tablet, 6 mg, Gastric (NG, OG, PEG, GT), NIGHTLY, Thimm, Terra, MD, 6 mg at 05/09/18 2051  .  INSERT & MAINTAIN PERIPHERAL IV ACCESS, , , UNTIL DISCONTINUED **AND** DRESSING CHANGE Saline Lock; DRY STERILE, , , PRN **AND** NS flush syringe, 2 mL, Intracatheter, Q8HRS, 2 mL at 05/10/18 0607 **AND** NS flush syringe, 2-6 mL, Intracatheter, Q1 MIN PRN, Dory Peru, Hansol, MD  .  nutrition protein supplement 15 g per 30 mL packet, 2 Packet, Gastric (NG, OG, PEG, GT), Daily, Michaelle Birks, MD, 30 g at 05/10/18 0827  .  nystatin (NYSTOP) 100,000 units/g  topical powder, , Apply Topically, 2x/day, Michaelle Birks, MD  .  polyethylene glycol (MIRALAX) oral packet, 17 g, Gastric (NG, OG, PEG, GT), Daily, Karma Greaser, MD, Stopped at 05/10/18 0900  .  prenatal vitamin-iron-folic acid tablet, 1 Tab, Gastric (NG, OG, PEG, GT), Daily, Babs Sciara, MD, 1 Tab at 05/10/18 [phone]  .  propranolol (INDERAL) tablet, 20 mg, Gastric (NG, OG, PEG, GT), 3x/day, Thimm, Terra, MD  .  senna concentrate (SENNA) 528mg  per 52mL oral liquid, 5 mL, Gastric (NG, OG, PEG, GT), 2x/day, Kasandra Knudsen, Victorino Dike, MD, 176 mg at 05/10/18 0829  .  SSIP insulin R human (HUMULIN R) 100 units/mL injection, 0-12 Units, Subcutaneous, Q4H PRN, 2 Units at 05/10/18 1125 **AND** POCT WHOLE BLOOD GLUCOSE, , , Q6H, Carlynn Herald, MD     Today's Physical Exam:  GEN:   NAD and in chair, acutely ill  HEENT:   Normocephalic        NECK:   Trach in place  PULM:   Non-labored respirations  CV:   Mildly tachycardic   ABD:   Abdomen soft, nontender, and nondistended  NEURO:  Tracks with eyes  Integumentary:  Pink, warm, and dry        Assessment:  Active Hospital Problems   (*Primary Problem)    Diagnosis   . *Fall     "found down" and taken to outside hospital     . COPD (chronic obstructive pulmonary disease) (CMS HCC)     Chronic   . Thrombocytopenia Unspecified   . Hypokalemia   . Alcohol abuse   . Troponin I above reference range   . Acute respiratory failure (CMS HCC)   . Nasal fracture     Acute vs chronic     . Abrasions of  multiple sites   . Scalp hematoma   . Humeral fracture     nonop per ortho     . UTI (urinary tract infection)     At outside hospital. Repeat UA pending     . Left inguinal hernia     S/p reduction     . Anasarca     Plan:  Nutrition: MNT PROTOCOL FOR DIETITIAN  ADULT TUBE FEEDING - CONTINUOUS DRIP CONTINUOUS NO MEALS, TF ONLY; OSMOLITE 1.5; OG; Initial Rate (ml/hr): 20; Increase by: 10 ml every 6 hours; Goal Rate (ml/hr): 60  DIET NPO - NOW EXCEPT TUBE FEEDS diet, Last Bowel Movement:  05/10/18  Activity: As tolerated    -Continue weaning to complete ATC, wean floor vent  -D/c Foley  -Augmentin for pneumonia (last dose 3/28)  -Transfer to 8NE only  -Lovenox for DVT ppx    Estevan Ryder, MD    Trauma/Surgical Critical Care/Acute Care Surgery Staff  Late entry for 05/10/2018.  I saw and examined the patient.  I reviewed the Resident note .  I agree with their findings and plan of care as documented in their note.  Any exceptions/additions are edited/noted.  No acute events reported overnight.  Acute respiratory failure - trach in place, nighttime BiPAP  Transfer to SDU with floor vent  D/c foley  Working on SNF/LTAC dispo  Humerus fx - non-op per Ortho  Lovenox, tube feeds  Electronically Signed by:    Harden Mo, DO, FACS  Associate Professor of Surgery  Trauma, Surgical Critical Care, and Acute Care Surgery  05/13/2018 14:45

## 2018-05-10 NOTE — Progress Notes (Signed)
[facility]                                               SICU PROGRESS NOTE    Antonio Gillespie  Date of Admission:  04/28/2018  Date of Service: 05/10/2018  Date of Birth:  1960-05-31    Primary Attending:  [name]  Primary Service:  Trauma Blue    Post Op Day:7 Days Post-Op S/P Procedure(s) (LRB):  TRACHEOSTOMY (N/A)  GASTROSCOPY WITH PLACEMENT PEG TUBE (N/A)     LOS: 12 days      Subjective:    NAEO.     Vital Signs:  Temp (24hrs) Max:38.1 C (696.7 F)      Systolic (89FYB), OFB:510 , Min:77 , CHE:527     Diastolic (78EUM), PNT:61, Min:56, Max:84    Temp  Avg: 37.4 C (99.3 F)  Min: 36.9 C (98.4 F)  Max: 38.1 C (100.6 F)  MAP (Non-Invasive)  Avg: 79 mmHG  Min: 68 mmHG  Max: 95 mmHG  Pulse  Avg: 101.4  Min: 89  Max: 110  Resp  Avg: 30.1  Min: 19  Max: 39  SpO2  Avg: 97.4 %  Min: 91 %  Max: 100 %  Pain Score (Numeric, Faces): Other    Meds  No current outpatient medications on file.     acetaminophen (TYLENOL) 160 mg per 5 mL liquid - grape flavored, 650 mg, Gastric (NG, OG, PEG, GT), Q4H PRN  albuterol (PROVENTIL) 2.70m/ 0.5 mL nebulizer solution, 2.5 mg, Nebulization, 3x/day    And  ipratropium-albuterol 0.5 mg-3 mg(2.5 mg base)/3 mL Solution for Nebulization, 3 mL, Nebulization, 3x/day  amoxicillin-clavulanate (AUGMENTIN) 200-28.5 mg per 5 mL oral liquid, 875 mg, Gastric (NG, OG, PEG, GT), 2x/day  aspirin chewable tablet 81 mg, 81 mg, Gastric (NG, OG, PEG, GT), Daily  chlorhexidine gluconate (PERIDEX) 0.12% mouthwash, 15 mL, Swish & Spit, 2x/day  enoxaparin PF (LOVENOX) 60 mg/0.6 mL SubQ injection, 0.5 mg/kg, Subcutaneous, Q12H  famotidine (PEPCID) tablet, 20 mg, Gastric (NG, OG, PEG, GT), 2x/day  gabapentin (NEURONTIN) 3089mper 4m40mral liquid, 300 mg, Gastric (NG, OG, PEG, GT), 3x/day  haloperidol (HALDOL) 5 mg/mL injection, 5 mg, Intravenous, Q6H PRN  lanolin-oxyquin-pet, hydrophil (BAG BALM) topical ointment, , Apply Topically, 2x/day PRN  melatonin tablet, 6 mg, Gastric (NG, OG,  PEG, GT), NIGHTLY  NS flush syringe, 2 mL, Intracatheter, Q8HRS    And  NS flush syringe, 2-6 mL, Intracatheter, Q1 MIN PRN  nutrition protein supplement 15 g per 30 mL packet, 2 Packet, Gastric (NG, OG, PEG, GT), Daily  nystatin (NYSTOP) 100,000 units/g topical powder, , Apply Topically, 2x/day  oxyCODONE (ROXICODONE) immediate release tablet, 5 mg, Gastric (NG, OG, PEG, GT), Q4H PRN  polyethylene glycol (MIRALAX) oral packet, 17 g, Gastric (NG, OG, PEG, GT), Daily  prenatal vitamin-iron-folic acid tablet, 1 Tab, Gastric (NG, OG, PEG, GT), Daily  propranolol (INDERAL) tablet, 10 mg, Gastric (NG, OG, PEG, GT), 3x/day  senna concentrate (SENNA) 528m32mr 15mL22ml liquid, 5 mL, Gastric (NG, OG, PEG, GT), 2x/day  SSIP insulin R human (HUMULIN R) 100 units/mL injection, 0-12 Units, Subcutaneous, Q4H PRN        Physical Exam:   GEN:cWER:[id number] frail male appears older than stated age   HEENT:Pupils 4mm b75mterally,large posterior scalp hematoma  NECK:Trachea midline, tracheostomy in place  PULM:Diminished breath  sounds bilaterally, course, Tracheostomy in place   CARDIAC:Afib   CHEST::No abrasions or contusions  OAC:ZYSAYTK soft, and nondistended  PELVIS:No evidence of injury  GU/RECTAL:large scrotal edema resolving   BACK:No evidence of injury and no step-off  ZS:WFUXNA noted on bilateral knees and feet  NEURO:Intubated, following commands  GLASGOW COMA SCALE:Eye opening:4 spontaneous, Verbal response:Trachostomy , Best motor response:6 obeys commands  VASCULAR:All pulses palpable and equal bilaterally  INTEGUMENTARY:Pink, warm, and dry andanasarca      Labs:  I have reviewed all lab results.  Hgb stable: 7.0 from 7.4  Electrolytes WNL    Recent Imaging:  CXR read pending      Assessment/ Plan:  Active Hospital Problems    Diagnosis   . Primary Problem: Fall   . COPD (chronic obstructive pulmonary disease) (CMS HCC)   . Thrombocytopenia Unspecified   . Hypokalemia   . Alcohol  abuse   . Troponin I above reference range   . Acute respiratory failure (CMS HCC)   . Nasal fracture   . Abrasions of multiple sites   . Scalp hematoma   . Humeral fracture   . UTI (urinary tract infection)   . Left inguinal hernia   . Anasarca       Antonio Gillespie is a 58 y.o. male who is 7 Days Post-Op S/P Procedure(s) (LRB):  TRACHEOSTOMY (N/A)  GASTROSCOPY WITH PLACEMENT PEG TUBE (N/A)      NEURO:  GCS: E4=Spontaneous (Opens Eyes on Own) M6=Normal (Follows Simple Commands) V1=None (Makes No Noise or Intubated)  Sedation/analgesia:    Gabapentin TID - Increased today   Tylenol PRN   Haldol PRN - Not needed 2 days   One Roxicodone  For pain this AM  Melatonin  Prenatal vitamin    CARDIOVASCULAR:  Systolic (35TDD), UKG:254 , Min:77 , YHC:623     Diastolic (76EGB), TDV:76, Min:56, Max:84     ART-Line  MAP: 85 mmHg   Troponins: No results found for: CKMB    MAP >72  HR 95-105  Meds: aspirin, propanolol  Increased propanolol       PULMONARY:  Airway Ventilator Settings   Tracheostomy 8 Cuffed (Active)   Trach Status Secured 05/08/2018  3:37 AM   Site Condition Intact 05/07/2018  8:00 PM   Gregory Site Cleaned 05/07/2018  8:00 PM   Caspian @ Bedside? Yes 05/07/2018  8:00 PM   Bilateral General Breath Sounds Coarse;Expiratory Wheezes 05/07/2018  8:00 PM    Ventilator Settings:  Set PEEP: 5 cmH2O  FiO2: 30 %   SpO2  Avg: 97.4 %  Min: 91 %  Max: 100 %  Blood Gas:  No results found for this encounter  Nebs: albuterol TID, sodium chloride TID  BAL 3/21--> 30,000 S. Aureus, <100 GNR  BiPAP settings overnight - 16/8  Secretions improved         GI:  MNT PROTOCOL FOR DIETITIAN  ADULT TUBE FEEDING - CONTINUOUS DRIP CONTINUOUS NO MEALS, TF ONLY; OSMOLITE 1.5; OG; Initial Rate (ml/hr): 20; Increase by: 10 ml every 6 hours; Goal Rate (ml/hr): 60  DIET NPO - NOW EXCEPT TUBE FEEDS   Recent Labs     05/08/18  0540   BILIRUBIN Negative     Last BM: Last Bowel Movement: 05/09/18  Proph: Senokot, Pepcid,  miralax    RENAL/GU:  Recent Labs     05/08/18  0540 05/08/18  1507 05/09/18  0028 05/09/18  1603 05/10/18  0043   SODIUM  --  135* 136 135* 135*   POTASSIUM  --  3.6 3.8 3.7 3.7   CHLORIDE  --  97 98 96 97   BUN  --  _0 CREATININE  --  0.50* 0.52* 0.55* 0.55*   GLUCOSE Negative  --   --   --   --    ANIONGAP  --  _1 CALCIUM  --  7.2* 7.8* 7.8* 7.7*   MAGNESIUM  --  1.9 2.0  --  2.0   PHOSPHORUS  --  3.8 3.5  --  3.5       I/O last 24 hours:      Intake/Output Summary (Last 24 hours) at 05/10/2018 0634  Last data filed at 05/10/2018 0500  Gross per 24 hour   Intake 2020 ml   Output 1920 ml   Net 100 ml     I/O current shift:  03/24 1900 - 03/25 0659  In: 1300 [P.O.:130; I.V.:500; Other:10; OZ:366]  Out: 40 [Urine:460]  Foley in critically ill pt for strict I/O's  Urine output increased after lasix yesterday. Down to 15-25 overnight. Given 500 mL plasmalyte and now 25-30  Patient diuresed yesterday w 20 mg lasix  UA does not show evidence of infection   No diuresis today    HEME:  Recent Labs     05/08/18  0053 05/09/18  0028 05/10/18  0043   HGB 7.5* 7.4* 7.0*   HCT 24.7* 24.8* 23.3*   PLTCNT 284 344 324     Transfusions:   Proph: SCD's, Lovenox     ID:  Temp (24hrs) Max:38.1 C (100.6 F)    Recent Labs     05/08/18  0053 05/09/18  0028 05/10/18  0043   WBC 8.5 8.0 9.1     Febrile    Cultures:  ABX: Deescalated to cefepime yesterday  Day 3 of ABx starting 3/22  New blood cultures drawn   3/23 - pending  G+C negative    ENDO:  SSI    MSK:  No skin breakdown    OTHER:  Lines: peripheral IV x2, foley, gastrostomy tube  Activity: Bedrest, HOB 30deg  PT/OT:  SNF  MNT:  ordered    PLAN:  Increased gabapentin  Increased propanolol for better HR control  Plan to continue night time BiPAP.  Continue tube feeds at goal  Continue to monitor blood cultures  Treating for MSSA pneumonia    Mikki Santee, MD  05/09/2018, 05:57      Late entry for 05/10/18. I saw and examined the patient.  I reviewed the resident's  note.  I agree with the findings and plan of care as documented in the resident's note.  Any exceptions/additions are edited/noted.    SP fall with humerus fx, acute resp failure requiring intubation and tracheostomy, inanition requiring peg    Main issues have been ventilator wean. ATC during day and rests on PSV overnight. Has relatively thick secretions requiring freq suctioning and hypertonic nebs, ok for transfer on CPAP overnight    CXR stable     Goal tube feeds    Hyponatremia 135 today stable  Goal 1L negative fluid balance, achieved with daily lasix, will hold on further diuresis.    BAL positive for MSSA on Augmentin     Critical Care Attestation    I was present at the bedside of this critically ill patient for 35 minutes exclusive of procedures.  This patient suffers from  failure or dysfunction of Pulmonary, GI/Hepatopancreaticobiliary, Renal and Immue/Allergic system(s).  The care of this patient was in regard to managing (a) conditions(s) that has a high probability of sudden, clinically significant, or life-threatening deterioration and required a high degree of Attending Physician attention and direct involvement to intervene urgently. Data review and care planning was performed in direct proximity of the patient, examination was obviously performed in direct contact with the patient. All of this time was exclusive of procedure which will be documented elsewhere in the chart.    My critical care time is independent and unique to other providers (no other providers saw patient for purposes of sicu evaluation)  My critical care time involved full attention to the patients' condition and included:    Review of nursing notes and/or old charts  Review of medications, allergies, and vital signs  Documentation time  Consultant collaboration on findings and treatment options  Care, transfer of care, and discharge plans  Ordering, interpreting, and reviewing diagnostic studies/tab tests  Obtaining  necessary history from family, EMS, nursing home staff and/or treating physicians    My critical care time did not include time spent teaching resident physician(s) or other services of resident physicians, or performing other reported procedures.  Total Critical Care Time: 35   minutes    Herschell Dimes, MD

## 2018-05-10 NOTE — Nurses Notes (Addendum)
PT arrived to [id number]. Sitter Select in place and call light within reach.

## 2018-05-10 NOTE — Care Management Notes (Signed)
MSW attempted to contact Pt's HCS, [name], at [phone] to discuss a discharge plan and offer choice. MSW left voicemail message.

## 2018-05-10 NOTE — Nurses Notes (Signed)
Paitent had low U/O, <15 per hour. SICU resident [facility] notified. 500 cc plasmalyte bolus administered. Will continue to monitor.

## 2018-05-11 ENCOUNTER — Inpatient Hospital Stay (HOSPITAL_COMMUNITY): Payer: Medicaid Other | Admitting: Radiology

## 2018-05-11 ENCOUNTER — Inpatient Hospital Stay (HOSPITAL_COMMUNITY): Payer: Medicaid Other

## 2018-05-11 DIAGNOSIS — T17908A Unspecified foreign body in respiratory tract, part unspecified causing other injury, initial encounter: Secondary | ICD-10-CM

## 2018-05-11 DIAGNOSIS — R627 Adult failure to thrive: Secondary | ICD-10-CM

## 2018-05-11 DIAGNOSIS — R159 Full incontinence of feces: Secondary | ICD-10-CM

## 2018-05-11 DIAGNOSIS — I517 Cardiomegaly: Secondary | ICD-10-CM

## 2018-05-11 DIAGNOSIS — Z515 Encounter for palliative care: Secondary | ICD-10-CM

## 2018-05-11 DIAGNOSIS — Z01818 Encounter for other preprocedural examination: Secondary | ICD-10-CM

## 2018-05-11 DIAGNOSIS — R0901 Asphyxia: Secondary | ICD-10-CM

## 2018-05-11 DIAGNOSIS — R918 Other nonspecific abnormal finding of lung field: Secondary | ICD-10-CM

## 2018-05-11 DIAGNOSIS — I4891 Unspecified atrial fibrillation: Secondary | ICD-10-CM

## 2018-05-11 DIAGNOSIS — J449 Chronic obstructive pulmonary disease, unspecified: Secondary | ICD-10-CM

## 2018-05-11 DIAGNOSIS — R140 Abdominal distension (gaseous): Secondary | ICD-10-CM

## 2018-05-11 DIAGNOSIS — F1020 Alcohol dependence, uncomplicated: Secondary | ICD-10-CM

## 2018-05-11 DIAGNOSIS — R32 Unspecified urinary incontinence: Secondary | ICD-10-CM

## 2018-05-11 DIAGNOSIS — J80 Acute respiratory distress syndrome: Secondary | ICD-10-CM

## 2018-05-11 DIAGNOSIS — J9600 Acute respiratory failure, unspecified whether with hypoxia or hypercapnia: Secondary | ICD-10-CM

## 2018-05-11 LAB — CBC
HCT: 23.7 % — ABNORMAL LOW (ref 38.9–52.0)
HCT: 26.3 % — ABNORMAL LOW (ref 38.9–52.0)
HGB: 7.1 g/dL — ABNORMAL LOW (ref 13.4–17.5)
HGB: 7.6 g/dL — ABNORMAL LOW (ref 13.4–17.5)
MCH: 26.8 pg (ref 26.0–32.0)
MCH: 26.3 pg (ref 26.0–32.0)
MCHC: 30 g/dL — ABNORMAL LOW (ref 31.0–35.5)
MCHC: 28.9 g/dL — ABNORMAL LOW (ref 31.0–35.5)
MCV: 89.4 fL (ref 78.0–100.0)
MCV: 91 fL (ref 78.0–100.0)
MPV: 12 fL (ref 8.7–12.5)
MPV: 11.3 fL (ref 8.7–12.5)
PLATELETS: 404 x10ˆ3/uL — ABNORMAL HIGH (ref 150–400)
PLATELETS: 428 x10ˆ3/uL — ABNORMAL HIGH (ref 150–400)
RBC: 2.65 x10ˆ6/uL — ABNORMAL LOW (ref 4.50–6.10)
RBC: 2.89 x10ˆ6/uL — ABNORMAL LOW (ref 4.50–6.10)
RDW-CV: 20.4 % — ABNORMAL HIGH (ref 11.5–15.5)
RDW-CV: 19.9 % — ABNORMAL HIGH (ref 11.5–15.5)
WBC: 7.2 x10ˆ3/uL (ref 3.7–11.0)
WBC: 8 x10ˆ3/uL (ref 3.7–11.0)

## 2018-05-11 LAB — BASIC METABOLIC PANEL
ANION GAP: 7 mmol/L (ref 4–13)
ANION GAP: 6 mmol/L (ref 4–13)
BUN/CREA RATIO: 17 (ref 6–22)
BUN/CREA RATIO: 19 (ref 6–22)
BUN: 9 mg/dL (ref 8–25)
BUN: 10 mg/dL (ref 8–25)
CALCIUM: 7.9 mg/dL — ABNORMAL LOW (ref 8.5–10.2)
CALCIUM: 8.2 mg/dL — ABNORMAL LOW (ref 8.5–10.2)
CHLORIDE: 100 mmol/L (ref 96–111)
CHLORIDE: 101 mmol/L (ref 96–111)
CO2 TOTAL: 32 mmol/L (ref 22–32)
CO2 TOTAL: 32 mmol/L (ref 22–32)
CREATININE: 0.54 mg/dL — ABNORMAL LOW (ref 0.62–1.27)
CREATININE: 0.53 mg/dL — ABNORMAL LOW (ref 0.62–1.27)
ESTIMATED GFR: >60 mL/min/1.73mˆ2 (ref >60)
ESTIMATED GFR: >60 mL/min/1.73mˆ2 (ref >60)
GLUCOSE: 124 mg/dL (ref 65–139)
GLUCOSE: 97 mg/dL (ref 65–139)
POTASSIUM: 3.8 mmol/L (ref 3.5–5.1)
POTASSIUM: 4.1 mmol/L (ref 3.5–5.1)
SODIUM: 139 mmol/L (ref 136–145)
SODIUM: 139 mmol/L (ref 136–145)

## 2018-05-11 LAB — ECG 12-LEAD
Atrial Rate: 106 {beats}/min
Calculated P Axis: -3 degrees
Calculated T Axis: 165 degrees
PR Interval: 220 ms
QRS Duration: 78 ms
QRS Duration: 78 ms
QT Interval: 388 ms
QTC Calculation: 515 ms
Ventricular rate: 106 {beats}/min

## 2018-05-11 LAB — PHOSPHORUS
PHOSPHORUS: 4 mg/dL (ref 2.4–4.7)
PHOSPHORUS: 5.1 mg/dL — ABNORMAL HIGH (ref 2.4–4.7)

## 2018-05-11 LAB — POC BLOOD GLUCOSE (RESULTS)
GLUCOSE, POC: 199 mg/dL — ABNORMAL HIGH (ref 70–105)
GLUCOSE, POC: 121 mg/dL — ABNORMAL HIGH (ref 70–105)
GLUCOSE, POC: 101 mg/dL (ref 70–105)

## 2018-05-11 LAB — MAGNESIUM
MAGNESIUM: 2 mg/dL (ref 1.6–2.6)
MAGNESIUM: 2.1 mg/dL (ref 1.6–2.6)

## 2018-05-11 LAB — PROCALCITONIN: PROCALCITONIN: 0.09 ng/mL (ref <0.50)

## 2018-05-11 LAB — TROPONIN-I: TROPONIN I: 33 ng/L — ABNORMAL HIGH (ref 0–30)

## 2018-05-11 MED ORDER — SCOPOLAMINE 1 MG OVER 3 DAYS TRANSDERMAL PATCH
1.00 | MEDICATED_PATCH | TRANSDERMAL | Status: DC
Start: 2018-05-12 — End: 2018-05-25
  Administered 2018-05-12 – 2018-05-18 (×3): 1 via TRANSDERMAL
  Administered 2018-05-18 – 2018-05-21 (×2): via TRANSDERMAL
  Administered 2018-05-21 – 2018-05-24 (×2): 1 via TRANSDERMAL
  Administered 2018-05-24: 01:00:00 via TRANSDERMAL
  Administered 2018-05-25: 13:00:00
  Filled 2018-05-11 (×5): qty 1

## 2018-05-11 MED ORDER — FENTANYL (PF) 50 MCG/ML INJECTION SOLUTION
Freq: Once | INTRAMUSCULAR | Status: AC | PRN
Start: 2018-05-11 — End: 2018-05-11
  Administered 2018-05-11: 50 ug via INTRAVENOUS

## 2018-05-11 MED ORDER — MIDAZOLAM 1 MG/ML INJECTION SOLUTION
Freq: Once | INTRAMUSCULAR | Status: AC | PRN
Start: 2018-05-11 — End: 2018-05-11
  Administered 2018-05-11: 2 mg via INTRAVENOUS

## 2018-05-11 MED ORDER — IPRATROPIUM 0.5 MG-ALBUTEROL 3 MG (2.5 MG BASE)/3 ML NEBULIZATION SOLN
3.00 mL | INHALATION_SOLUTION | Freq: Four times a day (QID) | RESPIRATORY_TRACT | Status: DC | PRN
Start: 2018-05-11 — End: 2018-05-18
  Administered 2018-05-13: 3 mL via RESPIRATORY_TRACT
  Filled 2018-05-11: qty 3

## 2018-05-11 MED ORDER — FENTANYL (PF) 50 MCG/ML INJECTION SOLUTION
INTRAMUSCULAR | Status: DC
Start: 2018-05-11 — End: 2018-05-12
  Filled 2018-05-11: qty 2

## 2018-05-11 MED ORDER — ELECTROLYTE-A INTRAVENOUS SOLUTION
INTRAVENOUS | Status: DC
Start: 2018-05-11 — End: 2018-05-15

## 2018-05-11 MED ORDER — MIDAZOLAM 1 MG/ML INJECTION SOLUTION
INTRAMUSCULAR | Status: DC
Start: 2018-05-11 — End: 2018-05-12
  Filled 2018-05-11: qty 5

## 2018-05-11 MED ORDER — ALBUTEROL SULFATE CONCENTRATE 2.5 MG/0.5 ML SOLUTION FOR NEBULIZATION
2.50 mg | INHALATION_SOLUTION | Freq: Three times a day (TID) | RESPIRATORY_TRACT | Status: DC
Start: 2018-05-11 — End: 2018-05-11

## 2018-05-11 MED ORDER — SODIUM CHLORIDE 7 % FOR NEBULIZATION
4.00 mL | INHALATION_SOLUTION | Freq: Three times a day (TID) | RESPIRATORY_TRACT | Status: DC
Start: 2018-05-11 — End: 2018-05-11

## 2018-05-11 MED ORDER — METOPROLOL TARTRATE 5 MG/5 ML INTRAVENOUS SOLUTION
5.0000 mg | Freq: Once | INTRAVENOUS | Status: AC
Start: 2018-05-11 — End: 2018-05-11

## 2018-05-11 MED ORDER — METOPROLOL TARTRATE 5 MG/5 ML INTRAVENOUS SOLUTION
INTRAVENOUS | Status: AC
Start: 2018-05-11 — End: 2018-05-11
  Administered 2018-05-11: 5 mg via INTRAVENOUS
  Filled 2018-05-11: qty 5

## 2018-05-11 MED ORDER — LIDOCAINE (PF) 10 MG/ML (1 %) INJECTION SOLUTION
INTRAMUSCULAR | Status: DC
Start: 2018-05-11 — End: 2018-05-12
  Filled 2018-05-11: qty 30

## 2018-05-11 MED ORDER — OXYCODONE 5 MG TABLET
2.50 mg | ORAL_TABLET | ORAL | Status: DC | PRN
Start: 2018-05-11 — End: 2018-05-15
  Administered 2018-05-13: 2.5 mg via GASTROSTOMY
  Filled 2018-05-11: qty 1

## 2018-05-11 MED ORDER — METOPROLOL TARTRATE 5 MG/5 ML INTRAVENOUS SOLUTION
5.0000 mg | INTRAVENOUS | Status: DC
Start: 2018-05-11 — End: 2018-05-12

## 2018-05-11 MED ORDER — OXYCODONE 5 MG TABLET
2.5000 mg | ORAL_TABLET | ORAL | Status: DC | PRN
Start: 2018-05-11 — End: 2018-05-11

## 2018-05-11 MED ORDER — PROPRANOLOL 10 MG TABLET
10.00 mg | ORAL_TABLET | Freq: Three times a day (TID) | ORAL | Status: DC
Start: 2018-05-11 — End: 2018-05-12
  Administered 2018-05-11 (×2): 10 mg via GASTROSTOMY
  Filled 2018-05-11 (×5): qty 1

## 2018-05-11 MED ADMIN — insulin regular human 100 unit/mL injection solution: SUBCUTANEOUS | @ 06:00:00

## 2018-05-11 MED ADMIN — fentaNYL (PF) 50 mcg/mL injection solution: INTRAVENOUS | @ 18:00:00

## 2018-05-11 MED ADMIN — amino ac-protein hydro-whey protein 10 gram-100 kcal/30 mL oral liquid: GASTROSTOMY | @ 09:00:00

## 2018-05-11 MED ADMIN — propranoloL 20 mg tablet: GASTROSTOMY | @ 08:00:00

## 2018-05-11 MED ADMIN — electrolyte-A intravenous solution: INTRAVENOUS | @ 19:00:00

## 2018-05-11 MED ADMIN — acetaminophen 325 mg/10.15 mL oral suspension: GASTROSTOMY | @ 05:00:00

## 2018-05-11 MED ADMIN — gabapentin 250 mg/5 mL oral solution: GASTROSTOMY | @ 14:00:00

## 2018-05-11 MED ADMIN — metoprolol tartrate 5 mg/5 mL intravenous solution: INTRAVENOUS | @ 18:00:00

## 2018-05-11 MED ADMIN — midazolam 1 mg/mL injection solution: INTRAVENOUS | @ 19:00:00

## 2018-05-11 MED ADMIN — sodium chloride 7 % for nebulization: @ 16:00:00

## 2018-05-11 MED ADMIN — melatonin 3 mg tablet: GASTROSTOMY | @ 22:00:00

## 2018-05-11 MED ADMIN — albuterol sulfate concentrate 2.5 mg/0.5 mL solution for nebulization: @ 16:00:00

## 2018-05-11 MED ADMIN — lidocaine (PF) 10 mg/mL (1 %) injection solution: @ 19:00:00

## 2018-05-11 MED ADMIN — metoprolol tartrate 5 mg/5 mL intravenous solution: INTRAVENOUS | @ 19:00:00

## 2018-05-11 MED ADMIN — albuterol sulfate concentrate 2.5 mg/0.5 mL solution for nebulization: RESPIRATORY_TRACT | @ 05:00:00

## 2018-05-11 MED ADMIN — sodium chloride 0.9 % (flush) injection syringe: @ 05:00:00

## 2018-05-11 MED ADMIN — sodium chloride 0.9 % intravenous solution: RESPIRATORY_TRACT | @ 05:00:00 | NDC 00338004904

## 2018-05-11 MED ADMIN — albuterol sulfate concentrate 2.5 mg/0.5 mL solution for nebulization: @ 12:00:00

## 2018-05-11 MED ADMIN — sodium chloride 0.9 % intravenous solution: GASTROSTOMY | @ 22:00:00

## 2018-05-11 MED ADMIN — ketorolac 30 mg/mL (1 mL) injection solution: @ 12:00:00

## 2018-05-11 MED ADMIN — HYDROmorphone (PF) 1 mg/mL injection solution: TOPICAL | @ 20:00:00

## 2018-05-11 MED ADMIN — enoxaparin 30 mg/0.3 mL subcutaneous syringe: GASTROSTOMY | @ 09:00:00

## 2018-05-11 MED ADMIN — sodium chloride 0.9 % intravenous solution: GASTROSTOMY | @ 14:00:00

## 2018-05-11 MED ADMIN — docusate sodium 100 mg capsule: @ 19:00:00

## 2018-05-11 MED ADMIN — lidocaine HCL 20 mg/mL (2 %) injection solution: GASTROSTOMY | @ 22:00:00

## 2018-05-11 MED ADMIN — HYDROmorphone 2 mg/mL injection syringe: ORAL | @ 22:00:00

## 2018-05-11 MED ADMIN — NICOTINE INHALER MOUTHPIECE: GASTROSTOMY | @ 21:00:00 | NDC 09991000822

## 2018-05-11 MED ADMIN — sodium chloride 0.9 % (flush) injection syringe: @ 14:00:00

## 2018-05-11 MED ADMIN — sodium chloride 0.9 % (flush) injection syringe: GASTROSTOMY | @ 22:00:00

## 2018-05-11 NOTE — Care Plan (Signed)
Plan of care reviewed with patient throughout shift. Patient is alert. UTA orientation status. Frequent reorientation provided. No S/S of pain. PRN Tylenol administered for fever as ordered. O2 saturations maintained 95-96% on 30% FiO2. Bronchial hygiene promoted. Frequent suctioning for moderate to large amounts of thick yellow/white sputum. Trach care as needed. Patient tolerating tube feedings well. Frequent linen changes due to incontinence of urine and stool. Turned every 2 hours to help prevent further skin breakdown. Fall, skin, seizure, and aspiration precautions maintained. Continuous telemetry and pulse oximetry in place. Vital signs stable.   Problem: Adult Inpatient Plan of Care  Goal: Plan of Care Review  Outcome: Ongoing (see interventions/notes)  Flowsheets (Taken 05/05/2018 0759 by Breck Coons, RN)  Plan of Care Reviewed With: patient  Goal: Patient-Specific Goal (Individualized)  Outcome: Ongoing (see interventions/notes)  Flowsheets (Taken 05/11/2018 0229)  Individualized Care Needs: turn Q2hours, reorientation  Anxieties, Fears or Concerns: UTA  Patient-Specific Goals (Include Timeframe): UTA  Goal: Absence of Hospital-Acquired Illness or Injury  Outcome: Ongoing (see interventions/notes)  Intervention: Identify and Manage Fall Risk  Flowsheets (Taken 05/10/2018 2330)  Safety Promotion/Fall Prevention:   activity supervised   fall prevention program maintained   motion sensor pad activated   nonskid shoes/slippers when out of bed   safety round/check completed  Intervention: Prevent Skin Injury  Flowsheets (Taken 05/11/2018 0229)  Body Position: turned q 2 hours  Intervention: Prevent and Manage VTE (venous thromboembolism) Risk  Flowsheets (Taken 05/10/2018 2330)  VTE Prevention/Management:   dorsiflexion/plantar flexion performed   sequential compression devices on   anticoagulant therapy maintained  Intervention: Prevent Infection  Flowsheets (Taken 05/11/2018 0229)  Infection Prevention:    glycemic control managed   rest/sleep promoted  Goal: Optimal Comfort and Wellbeing  Outcome: Ongoing (see interventions/notes)  Intervention: Provide Person-Centered Care  Flowsheets (Taken 05/10/2018 2330)  Trust Relationship/Rapport:   care explained   reassurance provided  Goal: Rounds/Family Conference  Outcome: Ongoing (see interventions/notes)     Problem: Fall Injury Risk  Goal: Absence of Fall and Fall-Related Injury  Outcome: Ongoing (see interventions/notes)  Intervention: Identify and Manage Contributors to Fall Injury Risk  Flowsheets (Taken 05/10/2018 2330)  Self-Care Promotion: independence encouraged  Medication Review/Management: medications reviewed  Intervention: Promote Injury-Free Environment  Flowsheets (Taken 05/10/2018 2330)  Safety Promotion/Fall Prevention:   activity supervised   fall prevention program maintained   motion sensor pad activated   nonskid shoes/slippers when out of bed   safety round/check completed     Problem: Skin Injury Risk Increased  Goal: Skin Health and Integrity  Outcome: Ongoing (see interventions/notes)  Intervention: Optimize Skin Protection  Flowsheets  Taken 05/10/2018 2330 by Roetta Sessions, RN  Pressure Reduction Techniques:   turned/repositioned   heels elevated off bed   pressure points protected  Pressure Reduction Devices:   specialty bed utilized   pressure-redistributing mattress utilized   positioning supports utilized  Skin Protection:   adhesive use limited   incontinence pads utilized   tubing/devices free from skin contact   transparent dressing maintained   skin-to-skin areas padded   skin-to-device areas padded   preventative decubiti skin protection foam dressing applied/intact  Taken 05/11/2018 0000 by Ozella Rocks, [place]  Head of Bed [facility]): 30 degrees     Problem: Communication Impairment (Artificial Airway)  Goal: Effective Communication  Outcome: Ongoing (see interventions/notes)     Problem: Device-Related Complication Risk (Artificial  Airway)  Goal: Optimal Device Function  Outcome: Ongoing (see interventions/notes)  Intervention: Optimize Device Care  and Function  Flowsheets  Taken 05/11/2018 0000 by Ozella [place], [place]  Airway Safety Measures: suction at bedside  Taken 05/10/2018 2330 by Roetta Sessions, RN  Airway/Ventilation Management:   airway patency maintained   pulmonary hygiene promoted  Aspiration Precautions: NPO pending swallow screening/evaluation     Problem: Skin and Tissue Injury (Artificial Airway)  Goal: Absence of Device-Related Skin or Tissue Injury  Outcome: Ongoing (see interventions/notes)

## 2018-05-11 NOTE — Nurses Notes (Signed)
Patient admitted to SICU 02 by RN & RT trasnport. Primary nurse at bedside. SICU service notified. Vitals and assessment per flow sheet.

## 2018-05-11 NOTE — Care Plan (Signed)
[facility]  Rehabilitation Services  Occupational Therapy Progress Note    Patient Name: Antonio Gillespie  Date of Birth: Jan 18, 1961  Height:  172.7 cm (5\' 8" )  Weight:  87 kg (191 lb 12.8 oz)  Room/Bed: 18/A  Payor: [employer] PENDING MEDICAID / Plan: ELIGIBILITY PENDING MEDICAID / Product Type: Medicaid /     Assessment:    Pt with fair tolerance of OT treatment.  Pt making minimal gains in therapy and continues to require assist of 2 for all mobility and self care.  Recommend SNF for continued care and therapy.       Discharge Needs:   Equipment Recommendation: to be determined    Discharge Disposition:  skilled nursing facility     JUSTIFICATION OF DISCHARGE RECOMMENDATION   Based on current diagnosis, functional performance prior to admission, and current functional performance, this patient requires continued OT services in skilled nursing facility in order to achieve significant functional improvements.    Plan:   Continue to follow patient according to established plan of care.  The risks/benefits of therapy have been discussed with the patient/caregiver and he/she is in agreement with the established plan of care.     Subjective & Objective:        05/11/18 1025   Therapist Pager   OT Assigned/ Pager # Alcario Drought [phone]   Rehab Session   Document Type therapy progress note (daily note)   Total OT Minutes: 31   Patient Effort fair   General Information   Patient Profile Reviewed yes   Medical Lines PIV Line;Telemetry;Foley Catheter   Respiratory Status aerosol trach collar   Existing Precautions/Restrictions fall precautions;full code;oxygen therapy device and L/min   Pre Treatment Status   Pre Treatment Patient Status Patient sitting in bedside chair or w/c;Call light within reach;Telephone within reach;Sitter select activated   Support Present Pre Treatment  None   Communication Pre Treatment  Nurse   Communication Pre Treatment Comment Pt and nursing agreeable to OT treatment with professional assist of  PT>    Mutuality/Individual Preferences   Individualized Care Needs MaxiMove for transfers   Vital Signs   O2 Delivery Pre Treatment supplemental O2   O2 Delivery Post Treatment supplemental O2   Vitals Comment ATC throughout session.    Pain Assessment   Pre/Posttreatment Pain Comment   (Denied pain throughout session. )   Coping/Psychosocial Response Interventions   Plan Of Care Reviewed With patient   Cognitive Assessment/Interventions   Behavior/Mood Observations alert;flat affect;impaired task initiation   Orientation Status oriented to;person   Attention moderate impairment;needs re-direction;difficulty attending to task/directions;distractible;difficult dividing attention   Follows Commands follows one step commands;initiation impaired;physical/tactile prompts required;repetition of directions required   Bed Mobility Assessment/Treatment   Bed Mobility, Assistive Device draw sheet   Safety Issues decreased use of arms for pushing/pulling;decreased use of legs for bridging/pushing;cognitive deficits limit understanding;impaired trunk control for bed mobility   Impairments balance impaired;cognition impaired;coordination impaired;endurance;flexibility decreased;motor control impaired;postural control impaired;ROM decreased;strength decreased   Comment Pt sat forward in the chair for ~10 mins with CGA.  Min A to pull self forward in the chair.    Roll Left Independence maximum assist (25% patient effort);1 person + 1 person to manage equipment   Roll Right Independence maximum assist (25% patient effort);1 person + 1 person to manage equipment   Scoot/Bridge Independence dependent (less than 25% patient effort)   Transfer Assessment/Treatment   Bed-Chair Independence dependent (less than 25% patient effort);2 person assist required  Bed-Chair-Bed Assist Device Teaching laboratory technician Issues balance decreased during turns;sequencing ability decreased;step length decreased;cognitive deficits limit  understanding;weight-shifting ability decreased   ADL Assessment/Intervention   ADL Comments Dep for all self care at this time due to impaired balance, strength, ROM in BUE/LE, postural stability, cognition.    Toileting Assessment/Training   Independence Level  dependent (less than 25% patient effort);2 person assist required   Impairments activity tolerance impaired;balance impaired;cognition impaired;coordination impaired;flexibility decreased;motor control impaired;pain;strength decreased;ROM decreased;postural control impaired   Comment Incont. of BM and Dep x2 to for hygiene.    Therapeutic Exercise/Activity   Comment AAROM exercises in L UE in all planes.  Pt given a resting hand splint for L hand due to decreased flexibility in finger extension. Nursing notified and educated on resting hand splint.    Post Treatment Status   Post Treatment Patient Status Patient supine in bed;Call light within reach;Sitter select activated;Telephone within reach   Support Present Post Treatment  None   Clinical Impression   Functional Level at Time of Session Pt with fair tolerance of OT treatment.  Pt making minimal gains in therapy and continues to require assist of 2 for all mobility and self care.  Recommend SNF for continued care and therapy.    Anticipated Equipment Needs at Discharge to be determined   Anticipated Discharge Disposition skilled nursing facility       Therapist:   Janne Napoleon, OT  Pager #: [phone]

## 2018-05-11 NOTE — Sedation Documentation (Signed)
[facility]  Adult Conscious Sedation  Procedure Note    Procedure Date:  05/11/2018 Time:  6:21PM to 6:31PM  Procedure: Conscious IV sedation  Diagnosis:  Aspiration Pneumonia  Indication: Aspiration  Supervising Provider: Michaelle Birks, MD    Pre-Sedation Assessment:  NPO: yes  Prior problems with anesthesia/sedation: No  Is patient intubated: Yes (trach)  Physical Exam completed: Yes  Medical History reviewed prior to sedation: Yes  ASA Class: IV  Mallampati Score: not assessed    Sedation:  Monitoring Equipment:  Cardiac Monitoring: Yes.  Rhythm: Normal Sinus Rhythm   Pulse Oximetry: Yes  IV access: peripheral IV  NIBP:  Yes  Arterial line: No  Sedatives: fentanyl and versed  Analgesia: Fentanyl  Paralytics: None    Post-Sedation Monitoring:  Respiratory Function:  Consistent with pre anesthetic level  Cardiovascular Function:  Consistent with pre anesthetic level  Mental Status:  Return to pre anesthetic baseline level  Pain:  Sufficiently controlled with medication  Nausea and Vomiting:  Absent or sufficiently controlled with medication    The patient was continuously monitored throughout the procedure and in recovery. I was in attendance and supervised the sedation (during the start and stop times listed above) and remained immediately available until the patient returned to pre-procedure baseline.    See Sedation Documentation Timeline Report for Additional Information in Chart Review or Summary    Total Duration of Conscious Sedation and Monitoring: 10 minutes    Michaelle Birks, MD  05/11/2018, 21:53    Dr. Eben Burow was present for the entirety of the procedure and supervised the sedation    Trauma/Surgical Critical Care Staff/ Acute Care Surgery  I was present and supervised/observed the entire procedure.  Electronically Signed by:    Robyn Haber DeLa'O  Assistant Professor of Surgery  Trauma, Surgical Critical Care, and Acute Care Surgery  [facility]  05/11/2018  23:13

## 2018-05-11 NOTE — Nurses Notes (Signed)
1615: Respiratory came to see pt. Pt started aspirating on tube feed. RN suctioned copious amount of secretions which appeared to be tube feed. Pt then coughed up a blood clot out of his trach. RN continued to suction bloody secretions. Trauma service paged to bedside. Told to hold tube feed and hang G-tube to gravity. chest xray &abdominal xray completed. Pt transferred to SICU for further care.     Taiyo Kozma K. Willeen Cass, RN  05/11/2018, 18:00

## 2018-05-11 NOTE — Respiratory Therapy (Signed)
Patients sp02 dropped to 80%. Suctioned copious tube feed from patients trach. Bagged and suctioned patient then placed patient on BiPAP 16/8. Service aware and will come to bedside when done with current trauma. Will continue to follow patient and wean as tolerated.

## 2018-05-11 NOTE — Progress Notes (Signed)
[facility]                                            Trauma Progress Note                 Date of Birth:  10-11-1960  Date of Admission:  04/28/2018  Date of service: 05/11/2018    Antonio Gillespie, 58 y.o., male Post trauma day 13 status post Fall    Events over the last 24 hours have included:  TX's to SDU  Foley Removed  Weaning ATC    Subjective:    Patient mouthing words and answering simple questions this AM. Following commands with all 4 extremities. Tolerating TF @60 /h, Frequent incontinence. BiPAP Overnight, ATC Daytime with Sats in 90s on 30%. Suctioning sputum from trach    Objective   24 Hour Summary:    Filed Vitals:    05/11/18 0318 05/11/18 0319 05/11/18 0500 05/11/18 0511   BP:  131/79     Pulse:  98     Resp:  20     Temp:  37.9 C (100.3 F) 37.7 C (99.9 F)    SpO2: 96%   96%     Labs:  Recent Labs     05/09/18  0028 05/09/18  1603 05/10/18  0043 05/11/18  0406   WBC 8.0  --  9.1 7.2   HGB 7.4*  --  7.0* 7.1*   HCT 24.8*  --  23.3* 23.7*   SODIUM 136 135* 135* 139   POTASSIUM 3.8 3.7 3.7 3.8   CHLORIDE 98 96 97 100   BUN 10 9 10 9    CREATININE 0.52* 0.55* 0.55* 0.54*   ANIONGAP 5 7 6 7    CALCIUM 7.8* 7.8* 7.7* 7.9*   MAGNESIUM 2.0  --  2.0 2.0   PHOSPHORUS 3.5  --  3.5 4.0       Intake/Output Summary (Last 24 hours) at 05/11/2018 6122  Last data filed at 05/11/2018 0600  Gross per 24 hour   Intake 1700 ml   Output 385 ml   Net 1315 ml     Nutrition Management: MNT PROTOCOL FOR DIETITIAN  ADULT TUBE FEEDING - CONTINUOUS DRIP CONTINUOUS NO MEALS, TF ONLY; OSMOLITE 1.5; OG; Initial Rate (ml/hr): 20; Increase by: 10 ml every 6 hours; Goal Rate (ml/hr): 60  DIET NPO - NOW EXCEPT TUBE FEEDS Last Bowel Movement: 05/11/18  No results for input(s): ALBUMIN, PREALBUMIN in the last 72 hours.  Current Medications:    Current Facility-Administered Medications:   .  acetaminophen (TYLENOL) 160 mg per 5 mL liquid - grape flavored, 650 mg, Gastric (NG, OG, PEG, GT), Q4H PRN, Thimm, Terra, MD, 650 mg at  05/11/18 0511  .  ipratropium-albuterol 0.5 mg-3 mg(2.5 mg base)/3 mL Solution for Nebulization, 3 mL, Nebulization, 3x/day, 3 mL at 05/11/18 0510 **AND** albuterol (PROVENTIL) 2.5mg / 0.5 mL nebulizer solution, 2.5 mg, Nebulization, 3x/day, Caryl Pina, MD, 2.5 mg at 05/11/18 0511  .  amoxicillin-clavulanate (AUGMENTIN) 200-28.5 mg per 5 mL oral liquid, 875 mg, Gastric (NG, OG, PEG, GT), 2x/day, Caryl Pina, MD, 875 mg at 05/10/18 2107  .  aspirin chewable tablet 81 mg, 81 mg, Gastric (NG, OG, PEG, GT), Daily, McCoy, Skyler, MD, 81 mg at 05/10/18 0829  .  chlorhexidine gluconate (PERIDEX) 0.12% mouthwash, 15 mL, Swish & Spit, 2x/day, Kasandra Knudsen,  Victorino Dike, MD, 15 mL at 05/10/18 2107  .  enoxaparin PF (LOVENOX) 60 mg/0.6 mL SubQ injection, 0.5 mg/kg, Subcutaneous, Q12H, Babs Sciara, MD, 50 mg at 05/10/18 2106  .  famotidine (PEPCID) tablet, 20 mg, Gastric (NG, OG, PEG, GT), 2x/day, Kasandra Knudsen, Victorino Dike, MD, 20 mg at 05/10/18 2107  .  gabapentin (NEURONTIN)  per 6mL oral liquid, 400 mg, Gastric (NG, OG, PEG, GT), 3x/day, McCoy, Skyler, MD, 400 mg at 05/10/18 2107  .  haloperidol (HALDOL) 5 mg/mL injection, 5 mg, Intravenous, Q6H PRN, Carlynn Herald, MD, 5 mg at 05/07/18 0135  .  lanolin-oxyquin-pet, hydrophil (BAG BALM) topical ointment, , Apply Topically, 2x/day PRN, Michaelle Birks, MD  .  melatonin tablet, 6 mg, Gastric (NG, OG, PEG, GT), NIGHTLY, Thimm, Terra, MD, 6 mg at 05/10/18 2107  .  INSERT & MAINTAIN PERIPHERAL IV ACCESS, , , UNTIL DISCONTINUED **AND** DRESSING CHANGE Saline Lock; DRY STERILE, , , PRN **AND** NS flush syringe, 2 mL, Intracatheter, Q8HRS, 2 mL at 05/11/18 0511 **AND** NS flush syringe, 2-6 mL, Intracatheter, Q1 MIN PRN, Dory Peru, Hansol, MD  .  nutrition protein supplement 15 g per 30 mL packet, 2 Packet, Gastric (NG, OG, PEG, GT), Daily, Michaelle Birks, MD, 30 g at 05/10/18 0827  .  nystatin (NYSTOP) 100,000 units/g topical powder, , Apply Topically, 2x/day, Michaelle Birks,  MD  .  polyethylene glycol (MIRALAX) oral packet, 17 g, Gastric (NG, OG, PEG, GT), Daily, Karma Greaser, MD, Stopped at 05/10/18 0900  .  prenatal vitamin-iron-folic acid tablet, 1 Tab, Gastric (NG, OG, PEG, GT), Daily, Babs Sciara, MD, 1 Tab at 05/10/18 [phone]  .  propranolol (INDERAL) tablet, 20 mg, Gastric (NG, OG, PEG, GT), 3x/day, Thimm, Terra, MD, 20 mg at 05/10/18 2108  .  senna concentrate (SENNA)  per 15mL oral liquid, 5 mL, Gastric (NG, OG, PEG, GT), 2x/day, Kasandra Knudsen, Victorino Dike, MD, 176 mg at 05/10/18 2107  .  SSIP insulin R human (HUMULIN R) 100 units/mL injection, 0-12 Units, Subcutaneous, Q4H PRN, 2 Units at 05/11/18 0628 **AND** POCT WHOLE BLOOD GLUCOSE, , , Q6H, Carlynn Herald, MD    Today's Physical Exam:  GEN:   NAD and Appears chronically Ill  HEENT:   Normocephalic; extraocular movements are intact, Conjunctivae pink, nasal mucosa normal, mucous membranes moist.  NECK:   Trach in place; ATC  PULM:   Lung sounds clear to auscultation bilaterally.  Normal respiratory effort.  No wheezes, rales or rhonchi.    CV:   Regular rate and rhythm  ABD:   Non-tender, Non-distended, PEG in place  NEURO:   Tracks with eyes  Integumentary:  Pink, warm, and dry and intact throughout without evidence of injury    Assessment/ Plan:   Active Hospital Problems   (*Primary Problem)    Diagnosis   . *Fall     "found down" and taken to outside hospital     . COPD (chronic obstructive pulmonary disease) (CMS HCC)     Chronic   . Thrombocytopenia Unspecified   . Hypokalemia   . Alcohol abuse   . Troponin I above reference range   . Acute respiratory failure (CMS HCC)   . Nasal fracture     Acute vs chronic     . Abrasions of multiple sites   . Scalp hematoma   . Humeral fracture     nonop per ortho     . UTI (urinary tract infection)     At outside hospital. Repeat UA pending     .  Left inguinal hernia     S/p reduction     . Anasarca     DVT prophylaxis:  Enoxaparin and SCDs/ Venodynes/Impulse  boots  Anticoagulants (last 24 hours)     Date/Time Action Medication Dose    05/10/18 2106 Given    enoxaparin PF (LOVENOX) 60 mg/0.6 mL SubQ injection 50 mg    05/10/18 0826 Given    enoxaparin PF (LOVENOX) 60 mg/0.6 mL SubQ injection 50 mg        Nutrition: MNT PROTOCOL FOR DIETITIAN  ADULT TUBE FEEDING - CONTINUOUS DRIP CONTINUOUS NO MEALS, TF ONLY; OSMOLITE 1.5; OG; Initial Rate (ml/hr): 20; Increase by: 10 ml every 6 hours; Goal Rate (ml/hr): 60  DIET NPO - NOW EXCEPT TUBE FEEDS diet, Last Bowel Movement: 05/11/18  Activity: OOB to chair   RUE nonweight bearing  Pain:   Analgesics (last 24 hours)     Date/Time Action Medication Dose    05/11/18 0511 Given    acetaminophen (TYLENOL) 160 mg per 5 mL liquid - grape flavored 650 mg    05/11/18 0044 Given    acetaminophen (TYLENOL) 160 mg per 5 mL liquid - grape flavored 650 mg        PT Recommendations: skilled nursing facility    Pneumonia   - Augmentin - until 3/28    R Humerus Fracture   - Ortho Consult   - Non-op and NWB    Plan:  - Dispo  - Roxi 2.5 added for Pain    Sharalyn Ink, MD  Resident, PGY-1  Department of Orthopaedics  Pager - [facility]  05/11/2018 06:55    Trauma/Surgical Critical Care/Acute Care Surgery Staff  I saw and examined the patient.  I reviewed the Resident note.  I agree with their findings and plan of care as documented in their note.  Any exceptions/additions are edited/noted.  No acute events reported overnight.  Decrease overnight BiPAP to 6 hours tomorrow night, did well with 8 hours tonight  Incontinent of stool/urine  Cultures negative  Acute blood loss anemia - Pre-Natal Vitamin - asymptomatic  WBC normal, afebrile  Tolerating tube feeds  Indicating some pain associated with known humerus fx - added PRN low dose oxycodone  Remains with cognitive decline which begain pre-injury per family, adult failure to thrive  Supportive care consult for POST form and goal clarification  Will need SNF - not likely to happen in near future  due to COVID risk at SNF.  Continue Lovenox  Electronically Signed by:    Harden Mo, DO, FACS  Associate Professor of Surgery  Trauma, Surgical Critical Care, and Acute Care Surgery  05/11/2018 10:04

## 2018-05-11 NOTE — Respiratory Therapy (Signed)
I was in this patient's room and assisted with _transport_________.

## 2018-05-11 NOTE — Consults (Signed)
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 Patient did not seem to understand his medication condition at the time of my visit, he did not have capacity to make decisions. He was unable to consistently follow commands. Conversation with HCS, Antonio Gillespie (brother), held over the phone, number on file. He mentioned a progressive decline in the patient over the past year or so. They had not had conversations regarding advanced care planning in the past, but he knew his brother would not want to be a burden of care nor would he want a life were he cannot be a semblance of what he used to be. He was in agreement with No CPR. He also mentioned a possible plan of the patient living with Antonio Gillespie's son, should he make a decent recovery. We agreed to touch base in the next couple of days or so, sooner if need be. I encouraged him to update his siblings.    Decision Making Capacity:  No    Advance Directives prior to consultation:  Healthcare Surrogate:  Yes, Name: Antonio Gillespie     and Phone Number [phone]    Recs:  - DNR/DNI  - agree with oxycodone 2.61m q4h PRN, consider making this available for dyspnea as well  - consider alternative medications from haldol for agitation, given prolonged QTc on EKG  - Will follow up with brother as planned above    Thank you for involving [facility] the care of this patient. We will continue to follow with you.    Information Obtained from: history reviewed via medical record  HPI/Discussion:     58M PMH A.fib, COPD, EtOH Abuse transferred from outside facility after being found lying face  down covered in urine/feces. Intubated at outside facility, CT scan revealing comminuted right humeral neck fracture, pulmonary artery enlargement, large left inguinal hernia. CT head showed bilateral scalp hematomas. Chart review shows he has had cognitive decline since pre-injury. He is s/p trach/peg placement, 05/04/2018. Hospitalization complicated by BAL positive for MSSA. EEG revealed no seizures. He is now on ATC, and has been transferred from SICU.    Consults on this admission: ENT; Wound Care; NeuroCrit Care; Ortho. Palliative now on board for [place]    Past Family, Medical, and Social History and ROS see H&P on 04/28/2018  by CMarlowe Gillespie  Additional ROS: Pain in neck and abdomen    Symptom Assessment:    Pain: CPOT 0  Pain History: Location(s) Neck and abdomen      Pt/Family unit dynamics: Supportive brother  Spiritual Assessment:  Believes in God    Medical orders prior to consultation: Post - POST form not discussed DNR Card - No  Code Status:  Full  Treatment Limitations prior to consultation:  None        Exam:  Temperature: 36.2 C (97.2 F)  Heart Rate: 98  BP (Non-Invasive): 131/79  Respiratory Rate: (!) 26  SpO2: 96 %  Pain Score (Numeric, Faces): 0    Constitutional:  acutely ill and moderately obese  Eyes:  Conjunctiva clear., Pupils equal and round.   ENT:  ENMT without erythema or injection, mucous membranes moist.  Neck:  tracheostomy on ATC, no oozing or secretions noted at time of visit  Respiratory:  good air entry bilaterally with some ronchi  Cardiovascular:  regular rate and rhythm  Gastrointestinal:  distended, tympanic, nontender, soft  Musculoskeletal:  Head atraumatic and normocephalic  Neurologic:  No tremor, No asterixis  Psychiatric:  Not agitated, confused    Ventilator Settings: N/A  HFNC Settings:N/A  BIPAP Settings:N/A  Automatic Implantable Cardiac Defibrillator  No    Palliative Performance Scale-  30      Labs:    Lab Results Today:    Results for orders placed or performed  during the hospital encounter of 04/28/18 (from the past 24 hour(s))   POC BLOOD GLUCOSE (RESULTS)   Result Value Ref Range    GLUCOSE, POC 152 (H) 70 - 105 mg/dl   POC BLOOD GLUCOSE (RESULTS)   Result Value Ref Range    GLUCOSE, POC 136 (H) 70 - 105 mg/dl   BASIC METABOLIC PANEL   Result Value Ref Range    SODIUM 139 136 - 145 mmol/L    POTASSIUM 3.8 3.5 - 5.1 mmol/L    CHLORIDE 100 96 - 111 mmol/L    CO2 TOTAL 32 22 - 32 mmol/L    ANION GAP 7 4 - 13 mmol/L    CALCIUM 7.9 (L) 8.5 - 10.2 mg/dL    GLUCOSE 124 65 - 139 mg/dL    BUN 9 8 - 25 mg/dL    CREATININE 0.54 (L) 0.62 - 1.27 mg/dL    BUN/CREA RATIO 17 6 - 22    ESTIMATED GFR >60 >60 mL/min/1.42m2   CBC   Result Value Ref Range    WBC 7.2 3.7 - 11.0 x10^3/uL    RBC 2.65 (L) 4.50 - 6.10 x10^6/uL    HGB 7.1 (L) 13.4 - 17.5 g/dL    HCT 23.7 (L) 38.9 - 52.0 %    MCV 89.4 78.0 - 100.0 fL    MCH 26.8 26.0 - 32.0 pg    MCHC 30.0 (L) 31.0 - 35.5 g/dL    RDW-CV 20.4 (H) 11.5 - 15.5 %    PLATELETS 404 (H) 150 - 400 x10^3/uL    MPV 12.0 8.7 - 12.5 fL   MAGNESIUM   Result Value Ref Range    MAGNESIUM 2.0 1.6 - 2.6 mg/dL   PHOSPHORUS   Result Value Ref Range    PHOSPHORUS 4.0 2.4 - 4.7 mg/dL   POC BLOOD GLUCOSE (RESULTS)   Result Value Ref Range    GLUCOSE, POC 199 (H) 70 - 105 mg/dl       Imaging Studies:  Images and Reports reviewed to current date.    I reviewed the AXR  Below, rotated film however agree with findings below.    Antonio Gillespie Male, 58 years old.years old.    XR AP MOBILE CHEST performed on 05/11/2018 5:31 AM.    REASON FOR EXAM:  pre-op exam    TECHNIQUE: 1 view/1 image(s) submitted for interpretation.    FINDINGS: Compared to 05/10/2018. The heart is enlarged. There is  redemonstration of prominence of the mediastinum. There is new  consolidation of the right lung base which could represent aspiration or  pneumonia. A tracheostomy tube is present.  IMPRESSION:  New right basilar consolidation may represent aspiration or pneumonia.    Antonio Gillespie  Male,  58 years old. old.    MRI BRAIN W/WO CONTRAST performed on 04/29/2018 11:47 PM.    REASON FOR EXAM:  Continued AMS of unknown origin      INTRAVENOUS CONTRAST: 9 ml's of Gadavist  CREATININE/GFR: Creat:0.14m/dL on 04/29/18, GFR >60    TECHNIQUE: Sagittal T1, axial T2, axial T2-weighted FLAIR, axial T1, axial  diffusion, axial susceptibility/gradient-echo imaging was performed. MR  perfusion imaging was performed. Axial and volumetric postcontrast whole  brain imaging was performed.    COMPARISON: CTA intracranial performed 04/29/2018.    FINDINGS: The pituitary and corpus callosum are unremarkable in appearance.  The cerebellum is normally formed. Overall, there is parenchymal volume  loss greater than expected for the patient's age. 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 There are scattered areas of  increased signal on T2-weighted imaging within the cerebral deep white  matter in a nonspecific distribution.  No diffusion abnormalities are present to suggest an acute infarct. There  is a single focus of signal dropout on gradient-echo imaging within the  right thalamus suggestive of a prior microhemorrhage.    No unexpected extra-axial fluid collections are appreciated. There are  sinonasal inflammatory changes. There is scalp swelling over the left  hemisphere.    Postcontrast whole brain imaging is somewhat limited by patient motion, but  demonstrates no definitive abnormalities.    IMPRESSION:   1. No acute infarcts or intracranial mass lesions are appreciated.    2. White matter signal changes greater than expected for the patient's age,  but in a nonspecific distribution.    3. Remote microhemorrhage within the right thalamus and a remote lacunar  infarct within the right cerebellum.    EKG:  Narrative & Impression     Sinus tachycardia with Premature atrial complexes  T wave abnormality, consider inferolateral ischemia  Abnormal ECG  When compared with ECG of 28-Apr-2018  18:33,  No significant change was found     Confirmed by MORISE MD, ANTHONY ([id number]) on 05/04/2018 3:57:38 PM   Result Data     Result Status Result Component Value Units   Final result [3] Ventricular rate 119 BPM    Atrial Rate 119 BPM    PR Interval 132 ms    QRS Duration 86 ms    QT Interval 366 ms    QTC Calculation 514 ms    Calculated P Axis 52 degrees    Calculated R Axis 50 degrees    Calculated T Axis -178 degrees         Consultant:  CPennie Banter MD    Total Time of Encounter:  635mutes  Time in Counseling as above:  >50% of the total time above spent in counseling, providing support and care coordination as detailed above.  Start time:  1100  Stop Time:  12[place]MD

## 2018-05-11 NOTE — Code Documentation (Signed)
ADULT RAPID RESPONSE NOTE    RRT ALERT: Yes    RRT ALERT: Yes Time RRT Arrived: 1105 Source of Request: MEWS: 8+   RRT APP Present: No RT Present: Yes      MEWS Score     MEWS SCORE (READ ONLY): 8    Primary Team Notification  When Primary was Notified: 80   Person Notified: Trauma intern   Time Response: 1105   Person who issued orders: none needed   Patient's Primary Team Present: No response required     Adult Rapid Response Team Event  RRT Events: Bedside Intervention, Documentation Update     Primary Concerns  Primary Concerns: Respiratory Distress         Secondary Concerns  Secondary Concerns: Altered Mental Status         Subject Type: Patient    Inital Assessement: Mews of 8   Assessment: Patient resting in bed. Has chronic trach which nurse is suctioning upon arrival. All VSS at this time. No s/s of distress. Pt stable. MD service aware of patient condition and no signs of decline at this time.   Intervention: trachial suction. Documentation updated.     Adult Rapid Response Team - Time Out     Time RRT Departed: [address] [name], RN  05/11/2018, 12:05

## 2018-05-11 NOTE — Nurses Notes (Signed)
Patient HR 190s - SICU ([facility]) called and to bedside - order for 5 mg of metoprolol for patient.     Will continue to monitor.

## 2018-05-11 NOTE — Progress Notes (Signed)
Family Phone call    The designated family member was not able to be reached by phone for updates.  As the designated family contact could not be reach, an attempt will be made tomorrow to communicate with the patient's family.    Burt Ek, MD  05/11/2018, 19:07    Electronically Signed by:    Harden Mo, DO, FACS  Associate Professor of Surgery  Trauma, Surgical Critical Care, and Acute Care Surgery  05/12/2018 08:37

## 2018-05-11 NOTE — Care Plan (Signed)
[facility]  Rehabilitation Services  Physical Therapy Progress Note      Patient Name: Antonio Gillespie  Date of Birth: 01/27/1961  Height:  172.7 cm (5\' 8" )  Weight:  87 kg (191 lb 12.8 oz)  Room/Bed: 18/A  Payor: [employer] PENDING MEDICAID / Plan: ELIGIBILITY PENDING MEDICAID / Product Type: Medicaid /     Assessment:     Mr. Pedreira demonstrates fair response to PT intervention this date. Pt is able to complete AROM in BLE while sitting in chair this session. Continues to require verbal and tactile cues to complete exercises and demonstrates impaired task initiation. Pt transferred back to bed via maximove. Rolling in bed for perineal care with maxAx1 and use of draw sheet. Current impairments include decreased strength, endurance, functional activity tolerance, balance impairments, and need for assistance to safely complete all functional tasks at this time. PT to recommend SNF to address current functional deficits. Will continue to follow.    Discharge Needs:   Equipment Recommendation: TBD  Discharge Disposition: skilled nursing facility    JUSTIFICATION OF DISCHARGE RECOMMENDATION   Based on current diagnosis, functional performance prior to admission, and current functional performance, this patient requires continued PT services in skilled nursing facility in order to achieve significant functional improvements in these deficit areas: aerobic capacity/endurance, ergonomics and body mechanics, gait, locomotion, and balance, integumentary integrity, joint integrity and mobility, motor function, muscle performance, neuromotor development and sensory integration, neuromuscular, posture, ROM (range of motion), ventilation and respiration/gas exchange.    Plan:   Continue to follow patient according to established plan of care.  The risks/benefits of therapy have been discussed with the patient/caregiver and he/she is in agreement with the established plan of care.     Subjective & Objective:         05/11/18 1300   Therapist Pager   PT Assigned/ Pager # Luther Parody [phone]   Rehab Session   Document Type therapy progress note (daily note)   Total PT Minutes: 31   Patient Effort fair   General Information   Patient Profile Reviewed yes   Medical Lines PIV Line;Telemetry;Foley Catheter   Respiratory Status aerosol trach collar   Existing Precautions/Restrictions fall precautions;full code;oxygen therapy device and L/min   Mutuality/Individual Preferences   Anxieties, Fears or Concerns None noted.    Individualized Care Needs OOB with maximove.    Pre Treatment Status   Pre Treatment Patient Status Patient sitting in bedside chair or w/c;Call light within reach;Telephone within reach;Nurse approved session   Support Present Pre Treatment  None   Communication Pre Treatment  Nurse   Communication Pre Treatment Comment RN medically cleared pt for participation in therapy intervention.    Cognitive Assessment/Interventions   Behavior/Mood Observations alert;flat affect;impaired task initiation   Orientation Status oriented to;person   Attention moderate impairment;needs re-direction;difficulty attending to task/directions;distractible;difficult dividing attention   Follows Commands follows one step commands;initiation impaired;physical/tactile prompts required;repetition of directions required   Vital Signs   O2 Delivery Pre Treatment supplemental O2   O2 Delivery Post Treatment supplemental O2   Vitals Comment ATC throughout session.    Pain Assessment   Pre/Posttreatment Pain Comment Denied pain throughout session.    Mobility Assessment/Training   Additional Documentation Bed Mobility Assessment/Treatment (Group)   Bed Mobility Assessment/Treatment   Bed Mobility, Assistive Device draw sheet   Safety Issues decreased use of arms for pushing/pulling;decreased use of legs for bridging/pushing;cognitive deficits limit understanding;impaired trunk control for bed mobility   Impairments balance  impaired;cognition  impaired;coordination impaired;endurance;flexibility decreased;motor control impaired;postural control impaired;ROM decreased;strength decreased   Roll Left [place] maximum assist (25% patient effort);1 person + 1 person to manage equipment   Roll Right [place] maximum assist (25% patient effort);1 person + 1 person to manage equipment   Scoot/Bridge Independence dependent (less than 25% patient effort);2 person assist required   Transfer Assessment/Treatment   Bed-Chair [place] dependent (less than 25% patient effort);2 person assist required   Bed-Chair-Bed Assist Device Maxi Move   Transfer Safety Issues balance decreased during turns;sequencing ability decreased;step length decreased;cognitive deficits limit understanding;weight-shifting ability decreased   Balance Skill Training   Sitting Balance: Static fair balance   Sitting, Dynamic (Balance) unable to balance   Sit-to-Stand Balance unable to balance   Standing Balance: Static unable to balance   Standing Balance: Dynamic unable to balance   Systems Impairment Contributing to Balance Disturbance musculoskeletal;cognitive;neuromuscular;somatosensory   Identified Impairments Contributing to Balance Disturbance impaired coordination;abnormal muscle tone;impaired motor control;impaired postural control;decreased ROM;decreased strength;other (see comments)  (cognition)   Therapeutic Exercise/Activity   Lower Extremity LAQ (long arc quad), bilateral   Functional Endurance Training   Comment, Functional Endurance Poor   Post Treatment Status   Post Treatment Patient Status Patient supine in bed;Call light within reach;Sitter select activated;Telephone within reach   Support Present Post Treatment  None   Film/video editor Nurse   Communication Post Treatment Comment Pt performance.    Plan of Care Review   Plan Of Care Reviewed With patient   Basic Mobility Am-PAC/6Clicks Score   Turning in bed without bedrails 1   Lying on back to  sitting on edge of flat bed 1   Moving to and from a bed to a chair 1   Standing up from chair 1   Walk in room 1   Climbing 3-5 steps with railing 1   6 Clicks Raw Score total 6   Standardized (t-scale) score 16.59   CMS 0-100% Score 100   CMS Modifier CN   Patient Mobility Goal (JHHLM) 3- Sit at edge of bed 3X/day   Exercise/Activity Level Performed 3- Sat at edge of bed   Physical Therapy Clinical Impression   Assessment Mr. Waight demonstrates fair response to PT intervention this date. Pt is able to complete AROM in BLE while sitting in chair this session. Continues to require verbal and tactile cues to complete exercises and demonstrates impaired task initiation. Pt transferred back to bed via maximove. Rolling in bed for perineal care with maxAx1 and use of draw sheet. Current impairments include decreased strength, endurance, functional activity tolerance, balance impairments, and need for assistance to safely complete all functional tasks at this time. PT to recommend SNF to address current functional deficits. Will continue to follow.   Anticipated Equipment Needs at Discharge (PT) TBD   Anticipated Discharge Disposition skilled nursing facility       Therapist:   Hyacinth Meeker, PT, DPT   Pager #: [phone]

## 2018-05-11 NOTE — Procedures (Signed)
[facility]  Bronchoscopy    Date: 05/11/2018  Time:  6:20  Procedure:  Bronchoscopy   Indication/Diagnosis:  Aspiration    Description of Procedure:  After informed consent was obtained, a surgical time out was performed to confirm correct patient and the procedure. The ventilator was placed on pressure control with an FiO2 of 100%.  5cc of 1% lidocaine was injected down the ETT.  Fentanyl and Versed were administered for sedation. Under satisfactory anesthesia and sterile conditions, the patient underwent fiberoptic bronchoscopy through the endotracheal/tracheostomy tube. The adult flexible bronchoscope was inserted easily through the endotracheal tube and into the trachea. A bronchial alveolar lavage was performed in the L lung.  All airways were inspected to the level of segmental bronchi.  A copious amount of secretions were encountered and aspirated as completely as possible.  The bronchoscope was removed at the conclusion of the examination. The patient tolerated procedure without complications.     Procedure was performed by Dr. Arlyss Gandy.    Dr. Eben Burow was present, supervised and participated in the procedure.      Michaelle Birks, MD  05/11/2018, 18:28    Trauma/Surgical Critical Care Staff/ Acute Care Surgery  I was present and supervised/observed the entire procedure.  Electronically Signed by:    Robyn Haber DeLa'O  Assistant Professor of Surgery  Trauma, Surgical Critical Care, and Acute Care Surgery  [facility]  05/11/2018 19:14

## 2018-05-11 NOTE — Nurses Notes (Signed)
05/10/18 2333   Vital Signs   Temperature 38 C (100.4 F)   Patient febrile. PRN Tylenol re-ordered from pharmacy. Will administer as ordered once received. Will monitor.

## 2018-05-11 NOTE — H&P (Signed)
[facility]                                 SICU ADMISSION        HISTORY and PHYSICAL    Antonio Gillespie, Antonio Gillespie  Date of Admission:  04/28/2018  Date of Birth:  1960/03/30  Date of Service: 05/11/2018    Primary Attending:  Harvin Hazel  Primary Service: Trauma Blue    Information Obtained from: history reviewed via medical record  Chief Complaint: Aspiration    PCP: Darlin Coco, MD  Consult Requested By: Marya Amsler    HPI:      Antonio Gillespie is a 58 y.o. male with PMH of atrial fibrillation, COPD, EtOH abuse who was previous admitted to SICU care this admission for a humerus fracture, and left inguinal hernia.  Patient underwent a tracheostomy and PEG tube while in the unit before being transferred to step-down 3/25. Patient today aspirated while taking his tube feeds. When nursing attempted to suction the secretions, frank blood was drawn. Patient transferred back to SICU for further care.     ROS:  MUST comment on all "Abnormal" findings     ROS Other than ROS in the HPI, all other systems were negative.    PAST MEDICAL/ FAMILY/ SOCIAL HISTORY:     Past Medical History:   Diagnosis Date   . Atrial fibrillation (CMS HCC)    . COPD (chronic obstructive pulmonary disease) (CMS HCC)    . ETOH abuse          No Known Allergies  Medications Prior to Admission     None         No current outpatient medications on file.         Family Medical History:     None            Social History     Tobacco Use   . Smoking status: Unknown If Ever Smoked   Substance Use Topics   . Alcohol use: Not on file   . Drug use: Not on file     Patient is currently intubated. Any information regarding ROS, Past medical, surgical, family, or social history is unable to be obtained from the patient.  Any information documented below has been obtained from the EMR.  Any additional history cannot be obtained presently due to his medical condition.        Physical Exam     Filed Vitals:    05/11/18 0835 05/11/18 1155 05/11/18 1319  05/11/18 1459   BP: 99/65 121/77 121/77 105/86   Pulse: (!) 102 99  (!) 104   Resp: (!) 31 (!) 33  (!) 39   Temp:  36.6 C (97.8 F)  37.1 C (98.8 F)   SpO2: 100% 100%  97%     GEN:   NAD and Appears chronically Ill  HEENT:   Normocephalic; extraocular movements are intact, Conjunctivae pink, nasal mucosa normal, mucous membranes moist.  NECK:   Trach in place; re-connected to ventilator  PULM:   Tachypnic with high respiratory effort.   CV:   Regular rate and rhythm  ABD:   Non-tender, Non-distended, PEG in place  NEURO:   Tracks with eyes  Integumentary:  Pink, warm, and dry and intact throughout without evidence of injury    Labs   I have reviewed all lab results.    Recent Imaging  Results for orders placed or performed during the hospital encounter of 04/28/18 (from the past 72 hour(s))   XR AP MOBILE CHEST     Status: None    Narrative    Antonio Gillespie  Male, 58 years old.    XR AP MOBILE CHEST performed on 05/09/2018 5:01 AM.    REASON FOR EXAM:  pre-op exam    TECHNIQUE: 1 view/1 image(s) submitted for interpretation.    FINDINGS: Compared to 05/08/2018. The heart is enlarged and there is  prominence of the mediastinum which is unchanged. The lungs are clear.  Tracheostomy tube remains in place.        Impression    Stable chest examination.     XR AP MOBILE CHEST     Status: None    Narrative    Antonio Gillespie  Male, 57 years old.    XR AP MOBILE CHEST performed on 05/10/2018 3:58 AM.    REASON FOR EXAM:  pre-op exam    TECHNIQUE: 1 view/1 image(s) submitted for interpretation.    FINDINGS: Compared to 05/09/2018. The heart is mildly prominent size but  stable. Prominence of the mediastinum is also stable finding. Patient is  rotated. There is no focal lung consolidation. Tracheostomy tube is  present.        Impression    Stable chest examination.     XR AP MOBILE CHEST     Status: None    Narrative    Antonio Gillespie  Male, 58 years old.    XR AP MOBILE CHEST performed on 05/11/2018 5:31 AM.    REASON FOR  EXAM:  pre-op exam    TECHNIQUE: 1 view/1 image(s) submitted for interpretation.    FINDINGS: Compared to 05/10/2018. The heart is enlarged. There is  redemonstration of prominence of the mediastinum. There is new  consolidation of the right lung base which could represent aspiration or  pneumonia. A tracheostomy tube is present.        Impression    New right basilar consolidation may represent aspiration or pneumonia.         Assessment/Plan     Active Hospital Problems    Diagnosis   . Primary Problem: Fall   . COPD (chronic obstructive pulmonary disease) (CMS HCC)   . Thrombocytopenia Unspecified   . Hypokalemia   . Alcohol abuse   . Troponin I above reference range   . Acute respiratory failure (CMS HCC)   . Nasal fracture   . Abrasions of multiple sites   . Scalp hematoma   . Humeral fracture   . UTI (urinary tract infection)   . Left inguinal hernia   . Anasarca       Antonio Gillespie is a 59 y.o. male who is 8 Days Post-Op S/P Procedure(s) (LRB):  TRACHEOSTOMY (N/A)  GASTROSCOPY WITH PLACEMENT PEG TUBE (N/A)    NEURO:  GCS: E4=Spontaneous (Opens Eyes on Own) M6=Normal (Follows Simple Commands) V1=None (Makes No Noise or Intubated)  SBP < 160  Melatonin  Prenatal vitamin  Sedation/analgesia: tylenol, gabapentin, haldol PRN, roxicodone    CARDIOVASCULAR:  Systolic (79GXQ), JJH:417 , Min:87 , EYC:144     Diastolic (81EHU), DJS:97, Min:55, Max:86     ART-Line  MAP: 85 mmHg   Troponins: No results found for: CKMB   Meds: aspirin, propanolol    PULMONARY:  SpO2  Avg: 97.8 %  Min: 95 %  Max: 100 %  Blood Gas:  No results found for  this encounter  Nebs: duoneb PRN  Patient on ventilator  Imaging: CXR ordered    GI:  MNT PROTOCOL FOR DIETITIAN  ADULT TUBE FEEDING - CONTINUOUS DRIP CONTINUOUS NO MEALS, TF ONLY; OSMOLITE 1.5; OG; Initial Rate (ml/hr): 20; Increase by: 10 ml every 6 hours; Goal Rate (ml/hr): 60  DIET NPO - NOW EXCEPT TUBE FEEDS    No results for input(s): PREALBUMIN, BILIRUBIN, ALBUMIN, AMYLASE, LIPASE in  the last 72 hours.    Invalid input(s): PHOSPHATASE, TRANSAMINASE  Last BM: Last Bowel Movement: 05/11/18  Proph: Pepcid  Bowel regimen: Miralax, senna    RENAL/GU:  Recent Labs     05/09/18  0028 05/09/18  1603 05/10/18  0043 05/11/18  0406   SODIUM 136 135* 135* 139   POTASSIUM 3.8 3.7 3.7 3.8   CHLORIDE 98 96 97 100   BUN _0 CREATININE 0.52* 0.55* 0.55* 0.54*   ANIONGAP _1 CALCIUM 7.8* 7.8* 7.7* 7.9*   MAGNESIUM 2.0  --  2.0 2.0   PHOSPHORUS 3.5  --  3.5 4.0       I/O last 24 hours:      Intake/Output Summary (Last 24 hours) at 05/11/2018 1800  Last data filed at 05/11/2018 0835  Gross per 24 hour   Intake 940 ml   Output --   Net 940 ml     I/O current shift:  03/26 0700 - 03/26 1859  In: 60 [GT:60]  Out: -   Foley in critically ill pt for strict I/O's  IV fluids: plasmalyte @ 100 ml/hr    HEME:  Recent Labs     05/09/18  0028 05/10/18  0043 05/11/18  0406   HGB 7.4* 7.0* 7.1*   HCT 24.8* 23.3* 23.7*   PLTCNT 344 324 404*     Transfusions:   Proph: SCD's, lovenox    ID:  Temp (24hrs) Max:38 C (100.4 F)    Recent Labs     05/09/18  0028 05/10/18  0043 05/11/18  0406   WBC 8.0 9.1 7.2     BAL: Sent   ABX: augmentin    ENDO:  No results for input(s): GLUCOSEPOC in the last 24 hours.  SSI    MSK:  No skin breakdown  Humerus fracture non-op    OTHER:  Lines: peripheral IVx2, G tube, tracheostomy   Activity: HOB 30deg  PT/OT:  ordered  MNT:  ordered    PLAN:  Bronchoscopy performed  F/U BAL  F/U CXR, EKG, troponins  Patient placed back on ventilator   Haldol PRN for agitation  Hold tube feeds for now      Verlon Au, MD 05/11/2018, 18:00     Trauma/Surgical Critical Care/Acute Care Surgery Staff    I saw and examined the patient. I reviewed the residents note. I agree with the findings and plan of care as documented in the resident's note. Any exceptions/additions are edited/noted.    Neuro:  Continue multimodal pain medication  Cont PRN haldol for agitation  Sleep: melatonin     Resp:   Respiratory  Rate: (!) 31  SpO2: 98 %  No results found for this encounter  Conventional settings:     Aspiration witnessed. Will plan for bronch to lavage  Continue aggressive pulmonary hygiene/IS   Prn duonebs  Return to vent    CV:  SBP over last 24:  Systolic (03BCW), UGQ:916 , Min:87 , Max:131   Hemodynamically acceptable. Cont  to monitor  Home Meds:  ASA and propranolol    GI:  Diet: NPO.   Hold TFs for now. Previously on continuous  Bowel Reg-continue  Last Bowel Movement: 05/11/18  GI PPX: pepcid    ENDO: For hyperglycemia management: ISS:    Renal/FEN:     Intake/Output Summary (Last 24 hours) at 05/11/2018 1928  Last data filed at 05/11/2018 1900  Gross per 24 hour   Intake 1520 ml   Output --   Net 1520 ml     Recent Labs     05/10/18  0043 05/11/18  0406 05/11/18  1852   SODIUM 135* 139 139   POTASSIUM 3.7 3.8 4.1   CHLORIDE 97 100 101   BUN _0 CREATININE 0.55* 0.54* 0.53*   ANIONGAP _1 CALCIUM 7.7* 7.9* 8.2*   MAGNESIUM 2.0 2.0 2.1   PHOSPHORUS 3.5 4.0 5.1*     Uo:Monitor Uo,  IVFS: plasmalyte @ 100 cc/h   Lytes-no replacements   Foley-yes. critical I&O    Heme: Ac Blood Loss Anemia-asx Ensure all blood draws are pedi tubes to min volume taken.  Temperature: 37.1 C (98.8 F)  Recent Labs     05/11/18  0406 05/11/18  1852   WBC 7.2 8.0   HGB 7.1* 7.6*   HCT 23.7* 26.3*   PLTCNT 404* 428*     ID: Temp (12hrs), Avg:36.6 C (97.9 F), Min:36.2 C (97.2 F), Max:37.1 C (98.8 F)  Abx:   augmentin  BAL sent with bronch plan    Skin/Musculoskeletal:   continue local wound care  DVT PPX: SCDs, Lovenox  Therapies: PT/OT  Disposition:cont ICU    Critical Care Attestation  Patient with Acute hypoxic/hyercarbic Respiratory insufficiency/Failure requiring frequent monitoring and assessment including including aggressive pulmonary hygiene. Most likely secondary to aspiration.  This patient suffers from failure or dysfunction of Neurologic/Sensory, Respiratory, Cardiovascular,  GI/Hepatopancreaticobiliary,Infectious/Hematologic and Musculoskeletal system(s).      I was present at the bedside of this critically ill patient exclusive of procedures.The care of this patient was in regard to managing (a) conditions(s) that has a high probability of sudden, clinically significant, or life-threatening deterioration and required a high degree of Attending Physician attention and direct involvement to intervene urgently.   Data review and care planning was performed in direct proximity of the patient, examination was obviously performed in direct contact with the patient. All of this time was exclusive of procedure which will be documented elsewhere in the chart.    My critical care time is independent and unique to other providers (no other providers saw patient for purposes of SICU evaluation)  My critical care time involved full attention to the patients' condition and included:    Review of nursing notes and/or old charts  Review of medications, allergies, and vital signs  Documentation time  Consultant collaboration on findings and treatment options  Care, transfer of care, and discharge plans  Ordering, interpreting, and reviewing diagnostic studies/tab tests  Obtaining necessary history from family, EMS, nursing home staff and/or treating physicians    My critical care time did not include time spent teaching resident physician(s) or other services of resident physicians, or performing other reported procedures.    Total Critical Care Time: 45 minutes  Electronically Signed by:    Tawana Scale MD, MS  Assistant Professor of Surgery  Trauma, Surgical Critical Care, and [place]  05/11/2018 19:28

## 2018-05-11 NOTE — Care Management Notes (Signed)
[facility]  Care Management Note    Patient Name: Antonio Gillespie  Date of Birth: 04/13/1960  Sex: male  Date/Time of Admission: 04/28/2018  3:32 PM  Room/Bed: 18/A  Payor: OUTSOURCE PENDING MEDICAID / Plan: ELIGIBILITY PENDING MEDICAID / Product Type: Medicaid /    LOS: 13 days   Primary Care Providers:  Selinda Eon, MD, MD (General)    Admitting Diagnosis:  Scalp hematoma [S00.03XA]    Assessment:      05/11/18 1521   Assessment Details   Assessment Type Continued Assessment   Date of Care Management Update 05/11/18   Date of Next DCP Update 05/12/18   Care Management Plan   Discharge Planning Status plan in progress   Projected Discharge Date 05/16/18   CM will evaluate for rehabilitation potential yes   Patient choice offered to patient/family no   Form for patient choice reviewed/signed and on chart no   Discharge Needs Assessment   Discharge Facility/Level of Care Needs SNF Placement (Medicare certified)(code 3)   Transportation Available other (see comments)  (TBD)       Discharge Plan:  SNF Placement ( Medicaid only certified) (code 64)  Pt is step down status, #8 trach cuffed- BiPAP at HS and ATC 30% weaning as tolerated, continuous G-tube tube feeds, trending labs and vital signs, pain management, PT/OT.  Medicaid is pending- Patient Matters assisting.    The patient will continue to be evaluated for developing discharge needs.     Case Manager: Irena Cords, CLINICAL CARE COORDINATOR  Phone: [phone]

## 2018-05-11 NOTE — Nurses Notes (Signed)
1735 - SICU Elwyn Reach) at bedside - patient tachycardic 100's, resp rate 40's, blood secretion from trach - notified and no orders given at this time - per [place] plans to bronch patient.     Will continue to monitor.

## 2018-05-12 ENCOUNTER — Inpatient Hospital Stay (HOSPITAL_COMMUNITY): Payer: Medicaid Other

## 2018-05-12 DIAGNOSIS — Z930 Tracheostomy status: Secondary | ICD-10-CM

## 2018-05-12 DIAGNOSIS — K567 Ileus, unspecified: Secondary | ICD-10-CM

## 2018-05-12 DIAGNOSIS — R140 Abdominal distension (gaseous): Secondary | ICD-10-CM

## 2018-05-12 DIAGNOSIS — J9690 Respiratory failure, unspecified, unspecified whether with hypoxia or hypercapnia: Secondary | ICD-10-CM

## 2018-05-12 LAB — CBC
HCT: 24 % — ABNORMAL LOW (ref 38.9–52.0)
HGB: 7.1 g/dL — ABNORMAL LOW (ref 13.4–17.5)
MCH: 26.9 pg (ref 26.0–32.0)
MCHC: 29.6 g/dL — ABNORMAL LOW (ref 31.0–35.5)
MCV: 90.9 fL (ref 78.0–100.0)
MCV: 90.9 fL (ref 78.0–100.0)
MPV: 11.8 fL (ref 8.7–12.5)
PLATELETS: 467 10*3/uL — ABNORMAL HIGH (ref 150–400)
RBC: 2.64 10*6/uL — ABNORMAL LOW (ref 4.50–6.10)
RDW-CV: 19.9 % — ABNORMAL HIGH (ref 11.5–15.5)
WBC: 7.4 10*3/uL (ref 3.7–11.0)

## 2018-05-12 LAB — BASIC METABOLIC PANEL
ANION GAP: 7 mmol/L (ref 4–13)
BUN/CREA RATIO: 17 (ref 6–22)
BUN/CREA RATIO: 17 (ref 6–22)
BUN: 9 mg/dL (ref 8–25)
CALCIUM: 7.8 mg/dL — ABNORMAL LOW (ref 8.5–10.2)
CHLORIDE: 102 mmol/L (ref 96–111)
CHLORIDE: 102 mmol/L (ref 96–111)
CO2 TOTAL: 30 mmol/L (ref 22–32)
CREATININE: 0.52 mg/dL — ABNORMAL LOW (ref 0.62–1.27)
ESTIMATED GFR: >60 mL/min/{1.73_m2} (ref >60)
GLUCOSE: 80 mg/dL (ref 65–139)
POTASSIUM: 4.5 mmol/L (ref 3.5–5.1)
SODIUM: 139 mmol/L (ref 136–145)

## 2018-05-12 LAB — POC BLOOD GLUCOSE (RESULTS)
GLUCOSE, POC: 76 mg/dL (ref 70–105)
GLUCOSE, POC: 85 mg/dL (ref 70–105)
GLUCOSE, POC: 87 mg/dL (ref 70–105)
GLUCOSE, POC: 96 mg/dL (ref 70–105)

## 2018-05-12 LAB — PHOSPHORUS: PHOSPHORUS: 4.8 mg/dL — ABNORMAL HIGH (ref 2.4–4.7)

## 2018-05-12 LAB — MAGNESIUM: MAGNESIUM: 2.1 mg/dL (ref 1.6–2.6)

## 2018-05-12 MED ORDER — PROPRANOLOL 20 MG TABLET
20.00 mg | ORAL_TABLET | Freq: Three times a day (TID) | ORAL | Status: DC
Start: 2018-05-12 — End: 2018-05-15
  Administered 2018-05-12: 0 mg via GASTROSTOMY
  Administered 2018-05-13 (×2): 20 mg via GASTROSTOMY
  Administered 2018-05-13: 0 mg via GASTROSTOMY
  Administered 2018-05-14 (×2): 20 mg via GASTROSTOMY
  Administered 2018-05-14: 0 mg via GASTROSTOMY
  Filled 2018-05-12 (×10): qty 1

## 2018-05-12 MED ORDER — PROPRANOLOL 20 MG TABLET
20.00 mg | ORAL_TABLET | Freq: Three times a day (TID) | ORAL | Status: DC
Start: 2018-05-12 — End: 2018-05-12
  Administered 2018-05-12 (×2): 20 mg via GASTROSTOMY
  Filled 2018-05-12 (×3): qty 1

## 2018-05-12 MED ADMIN — folic acid-vit B6-vit B12 2.2 mg-25 mg-0.5 mg tablet: GASTROSTOMY | @ 09:00:00

## 2018-05-12 MED ADMIN — amoxicillin 200 mg-potassium clavulanate 28.5 mg/5 mL oral suspension: GASTROSTOMY | @ 21:00:00

## 2018-05-12 MED ADMIN — sodium chloride 0.9 % (flush) injection syringe: @ 21:00:00

## 2018-05-12 MED ADMIN — propranoloL 20 mg tablet: GASTROSTOMY | @ 13:00:00

## 2018-05-12 MED ADMIN — gabapentin 250 mg/5 mL oral solution: GASTROSTOMY | @ 09:00:00

## 2018-05-12 MED ADMIN — LORazepam 1 mg tablet: SUBCUTANEOUS | @ 21:00:00

## 2018-05-12 MED ADMIN — phytonadione (vitamin K1) 5 mg tablet: GASTROSTOMY | @ 09:00:00

## 2018-05-12 MED ADMIN — sodium chloride 0.9 % intravenous solution: ORAL | @ 09:00:00 | NDC 00338004904

## 2018-05-12 MED ADMIN — sodium chloride 0.9 % intravenous solution: GASTROSTOMY | @ 09:00:00

## 2018-05-12 MED ADMIN — sodium chloride 0.9 % (flush) injection syringe: GASTROSTOMY | @ 09:00:00

## 2018-05-12 NOTE — Nurses Notes (Signed)
Patient growing more hypotensive after second dose of propanolol given. SICU Dr. Renee Harder notified, propranolol order modified. Will continue to monitor and assess.

## 2018-05-12 NOTE — Ancillary Notes (Signed)
SBIRT    SBIRT not completed at this time due to patient's medical status -trached.  Pending recommendations:  Attempt to complete SBIRT at later time/date.    Wyn Forster, LGSW Clinical Therapist 05/12/2018, 09:21  Pager  [phone]

## 2018-05-12 NOTE — Care Plan (Signed)
Medical Nutrition Therapy Follow Up        SUBJECTIVE : pt transferred back to SICU s/p aspirated while taking his tube feeds yesterday    OBJECTIVE:     Current Diet Order/Nutrition Support:  TF held but was receiving   Osmolite 1.5   60 ml/hr plus 60 ml prosource/day   Provides: 2280 cal = 26 cals/kg                120 g protein = 1.7 g/kg                1097 ml free water -    Height Used for Calculations: 172.7 cm (5' 7.99")  Weight Used For Calculations: 87.5 kg (192 lb 14.4 oz)  Weight Trends: 3/23- 90.8 kg, 3/27- 83.4 kg (+3 edema)    BMI (kg/m2): 29.4  BMI Assessment: BMI 25-29.9: overweight  Ideal Body Weight (IBW) (kg): 70.87  % Ideal Body Weight: 123.47     Estimated Needs:    Energy Calorie Requirements: 2200-2600 cal per day (25-30kcals/87.5kg)  Protein Requirements (gms/day): 106-142 g prot per day (1.5-2g/70.8kg)    per day (-mLs/-kg)    Comments: 58 yo male s/p fall resulting in humerus fx, nasal fx, scalp hematoma s/p trach and PEG 3/19        Plan/Interventions :   TF currently held  If able to restart TF recommend previous TF but start slowly 10 ml/hr and advance to goal of 60 ml/hr  Keep HOB elevated   Continue prenatal vitamin  Monitor weekly weights.   Will follow.       Nutrition Diagnosis: Swallowing difficulty related to decreased mental status as evidenced by Need for TF (in progress)    Seward Speck, RD, LD, CNSC 05/12/2018, 09:16  Pager [phone]

## 2018-05-12 NOTE — Respiratory Therapy (Signed)
Pt currently on BIPAP 20 IPAP 10 EPAP decreased to 35 %  Saturations currently 100 %   Will continue to monitor pt airway and oxygen requirements

## 2018-05-12 NOTE — Anesthesia Preprocedure Evaluation (Signed)
ANESTHESIA PRE-OP EVALUATION  Planned Procedure: TRACHEOSTOMY (N/A )  GASTROSCOPY WITH PLACEMENT PEG TUBE (N/A Abdomen)  Review of Systems     Physical Assessment      Airway                 Endotracheal tube present  No Tracheostomy present    Dental                    Pulmonary    Breath sounds clear to auscultation       Cardiovascular    Rhythm: regular  Rate: Normal       Other findings            Plan  ASA 3     Planned anesthesia type: general              Intravenous induction     Anesthesia issues/risks discussed are: Blood Loss, Stroke, Post-op Intubation/Ventilation, Aspiration, Cardiac Events/MI, Post-op Pain Management, Nerve Injuries and Intraoperative Awareness/ Recall.  Anesthetic plan and risks discussed with healthcare power of attorney.      Use of blood products discussed with healthcare power of attorney who.     Patient's NPO status is appropriate for Anesthesia.           Plan discussed with attending and CRNA.            Consent obtained from brother  R/b explained, AQA

## 2018-05-12 NOTE — Consults (Signed)
[place] [facility] Consult  Follow Up Note    Ransom, Antonio Gillespie, 58 y.o. male  Date of Service: 05/12/2018  Date of Birth:  1960/08/02    Hospital Day:  LOS: 14 days     Assessment/Recommendations:      Encounter for palliative care;  Adult Failure to Thrive secondary to Afib, COPD, EtOH abuse with respiratory failure needing intubation, s/p trach/PEG. Aspiration event overnight. I spoke with his brother, Mikki Santee, over the phone, and explained concern for repeat aspiration. Mikki Santee re-iterated his main concern was for the comfort of his brother, and should the time come, he wanted him to go peacefully. He expressed understanding of the concern for repeat aspiration, and understood it would not be unreasonable to seek hospice services. I explained what home with hospice and SNF with Hospice would look like, and how insurance may be a limiting factor. I also explained comfort measures    Pain: CPOT 0    POST completed:  POST form not discussed    Recs:  - Brother agrees with trying to wean off the patient from ventilator: I suggest this be the sole focus over the weekend, with the limitations as detailed below  - He agrees with CMO - no labs, no routine investigations, no ICU transfer, DNR/DNI  - I discussed with care management, pending Medicaid  - We agreed to follow up on Monday, depending on what happens over the weekend.  - Medication recs as detailed in yesterdays note. Scopolamine transdermal added by primary for secretions.    We will continue to follow with you.    Subjective:     Patient seen at bedside. He opens eyes to name. Not as interactive as yesterday.    Objective:  Temperature: 37.2 C (99 F)  Heart Rate: 74  BP (Non-Invasive): (!) 83/54  Respiratory Rate: (!) 24  SpO2: 91 %  Pain Score (Numeric, Faces): 0    Constitutional:  acutely ill and moderately obese  Eyes:  Conjunctiva clear., Pupils equal and round.   ENT:  ENMT without erythema or injection, mucous membranes  moist.  Neck:  tracheostomy on BIPAP via ventilator, some secretions around trach site  Respiratory:  good air entry bilaterally with some ronchi  Cardiovascular:  regular rate and rhythm  Gastrointestinal:  distended, tympanic, nontender, soft  Musculoskeletal:  Head atraumatic and normocephalic  Neurologic:  No tremor, No asterixis  Psychiatric:  Not agitated, somnolent    Other symptoms present: Delirium    Labs:    Lab Results Today:    Results for orders placed or performed during the hospital encounter of 04/28/18 (from the past 24 hour(s))   BRONCHOALVEOLAR LAVAGE (BAL) QUANTITATIVE CULTURE/GRAM STAIN   Result Value Ref Range    BRONCHOALVEOLAR LAVAGE (BAL) QUANTITATIVE CULTURE No Growth 18-24 hrs.     GRAM STAIN 3+ Several PMNs     GRAM STAIN No Organisms Seen    POC BLOOD GLUCOSE (RESULTS)   Result Value Ref Range    GLUCOSE, POC 101 70 - 105 mg/dl   TROPONIN-I   Result Value Ref Range    TROPONIN I 33 (H) 0 - 30 ng/L   BASIC METABOLIC PANEL   Result Value Ref Range    SODIUM 139 136 - 145 mmol/L    POTASSIUM 4.1 3.5 - 5.1 mmol/L    CHLORIDE 101 96 - 111 mmol/L    CO2 TOTAL 32 22 - 32 mmol/L    ANION GAP 6 4 -  13 mmol/L    CALCIUM 8.2 (L) 8.5 - 10.2 mg/dL    GLUCOSE 97 65 - 139 mg/dL    BUN 10 8 - 25 mg/dL    CREATININE 0.53 (L) 0.62 - 1.27 mg/dL    BUN/CREA RATIO 19 6 - 22    ESTIMATED GFR >60 >60 mL/min/1.16m2   PHOSPHORUS   Result Value Ref Range    PHOSPHORUS 5.1 (H) 2.4 - 4.7 mg/dL   MAGNESIUM   Result Value Ref Range    MAGNESIUM 2.1 1.6 - 2.6 mg/dL   CBC   Result Value Ref Range    WBC 8.0 3.7 - 11.0 x10^3/uL    RBC 2.89 (L) 4.50 - 6.10 x10^6/uL    HGB 7.6 (L) 13.4 - 17.5 g/dL    HCT 26.3 (L) 38.9 - 52.0 %    MCV 91.0 78.0 - 100.0 fL    MCH 26.3 26.0 - 32.0 pg    MCHC 28.9 (L) 31.0 - 35.5 g/dL    RDW-CV 19.9 (H) 11.5 - 15.5 %    PLATELETS 428 (H) 150 - 400 x10^3/uL    MPV 11.3 8.7 - 12.5 fL   PROCALCITONIN   Result Value Ref Range    PROCALCITONIN  0.09 <0.50 ng/mL   ECG 12-LEAD   Result Value Ref  Range    Ventricular rate 106 BPM    Atrial Rate 106 BPM    PR Interval 220 ms    QRS Duration 78 ms    QT Interval 388 ms    QTC Calculation 515 ms    Calculated P Axis -3 degrees    Calculated R Axis       Calculated T Axis 165 degrees   POC BLOOD GLUCOSE (RESULTS)   Result Value Ref Range    GLUCOSE, POC 96 70 - 105 mg/dl   BASIC METABOLIC PANEL   Result Value Ref Range    SODIUM 139 136 - 145 mmol/L    POTASSIUM 4.5 3.5 - 5.1 mmol/L    CHLORIDE 102 96 - 111 mmol/L    CO2 TOTAL 30 22 - 32 mmol/L    ANION GAP 7 4 - 13 mmol/L    CALCIUM 7.8 (L) 8.5 - 10.2 mg/dL    GLUCOSE 80 65 - 139 mg/dL    BUN 9 8 - 25 mg/dL    CREATININE 0.52 (L) 0.62 - 1.27 mg/dL    BUN/CREA RATIO 17 6 - 22    ESTIMATED GFR >60 >60 mL/min/1.762m   PHOSPHORUS   Result Value Ref Range    PHOSPHORUS 4.8 (H) 2.4 - 4.7 mg/dL   MAGNESIUM   Result Value Ref Range    MAGNESIUM 2.1 1.6 - 2.6 mg/dL   CBC   Result Value Ref Range    WBC 7.4 3.7 - 11.0 x10^3/uL    RBC 2.64 (L) 4.50 - 6.10 x10^6/uL    HGB 7.1 (L) 13.4 - 17.5 g/dL    HCT 24.0 (L) 38.9 - 52.0 %    MCV 90.9 78.0 - 100.0 fL    MCH 26.9 26.0 - 32.0 pg    MCHC 29.6 (L) 31.0 - 35.5 g/dL    RDW-CV 19.9 (H) 11.5 - 15.5 %    PLATELETS 467 (H) 150 - 400 x10^3/uL    MPV 11.8 8.7 - 12.5 fL   POC BLOOD GLUCOSE (RESULTS)   Result Value Ref Range    GLUCOSE, POC 76 70 - 105 mg/dl   POC BLOOD GLUCOSE (RESULTS)  Result Value Ref Range    GLUCOSE, POC 85 70 - 105 mg/dl       Ventilator Settings: BiPAP via ventilator      Consultant:  Pennie Banter, MD  Total Time of Encounter: 83mn Including face to face consultation with patient/family  Time in Counseling as above:  >50% of the total time above spent in counseling, providing support and care coordination as detailed above.  Start time:  1300  Stop Time:  1Perkins MD

## 2018-05-12 NOTE — Care Management Notes (Signed)
[facility]  Care Management Note    Patient Name: Antonio Gillespie  Date of Birth: 02/23/1960  Sex: male  Date/Time of Admission: 04/28/2018  3:32 PM  Room/Bed: 02/A  Payor: OUTSOURCE PENDING MEDICAID / Plan: ELIGIBILITY PENDING MEDICAID / Product Type: Medicaid /    LOS: 14 days   Primary Care Providers:  Selinda Eon, MD, MD (General)    Admitting Diagnosis:  Scalp hematoma [S00.03XA]    Assessment:      05/12/18 1518   Assessment Details   Assessment Type Continued Assessment   Date of Care Management Update 05/12/18   Date of Next DCP Update 05/15/18   Care Management Plan   Discharge Planning Status plan in progress   Projected Discharge Date 05/17/18   Discharge Needs Assessment   Discharge Facility/Level of Care Needs SNF Placement (Medicare certified)(code 3)     Service followed up with MSW on this day regarding a discharge plan for Pt. Medicaid application is still pending.     Discharge Plan:  SNF Placement (Medicare certified) (code 3)    The patient will continue to be evaluated for developing discharge needs.     Case Manager: Placido Sou, SOCIAL WORKER  Phone: [phone]

## 2018-05-12 NOTE — Progress Notes (Signed)
[facility]                                            Trauma Progress Note                 Date of Birth:  25-Feb-1960  Date of Admission:  04/28/2018  Date of service: 05/12/2018    Antonio Gillespie, 58 y.o., male Post trauma day 14 status post Fall    Events over the last 24 hours have included:  Desaturation yesterday with questionable aspiration event  Transfer back to SICU    Subjective:    Remains on BiPap via tracheostomy currently. Less alert that prior day, but still answers questions with head nods. Denies abdominal pain.     Objective   24 Hour Summary:    Filed Vitals:    05/12/18 0400 05/12/18 0500 05/12/18 0600 05/12/18 0700   BP: 125/81 113/73 106/70 94/65   Pulse: 94 93 89 (!) 104   Resp: (!) 28 (!) 26 20 (!) 22   Temp: 36.5 C (97.7 F)      SpO2: 92% 100% 100% 100%     Labs:  Recent Labs     05/11/18  0406 05/11/18  1852 05/12/18  0023   WBC 7.2 8.0 7.4   HGB 7.1* 7.6* 7.1*   HCT 23.7* 26.3* 24.0*   SODIUM 139 139 139   POTASSIUM 3.8 4.1 4.5   CHLORIDE 100 101 102   BUN _0 CREATININE 0.54* 0.53* 0.52*   ANIONGAP _1 CALCIUM 7.9* 8.2* 7.8*   MAGNESIUM 2.0 2.1 2.1   PHOSPHORUS 4.0 5.1* 4.8*       Intake/Output Summary (Last 24 hours) at 05/12/2018 2355  Last data filed at 05/12/2018 0700  Gross per 24 hour   Intake 1940 ml   Output 100 ml   Net 1840 ml     Nutrition Management: MNT PROTOCOL FOR DIETITIAN  ADULT TUBE FEEDING - CONTINUOUS DRIP CONTINUOUS NO MEALS, TF ONLY; OSMOLITE 1.5; OG; Initial Rate (ml/hr): 20; Increase by: 10 ml every 6 hours; Goal Rate (ml/hr): 60  DIET NPO - NOW Last Bowel Movement: 05/11/18  No results for input(s): ALBUMIN, PREALBUMIN in the last 72 hours.  Current Medications:    Current Facility-Administered Medications:   .  acetaminophen (TYLENOL) 160 mg per 5 mL liquid - grape flavored, 650 mg, Gastric (NG, OG, PEG, GT), Q4H PRN, Verlon Au, MD, 650 mg at 05/11/18 0511  .  amoxicillin-clavulanate (AUGMENTIN) 200-28.5 mg per 5 mL oral liquid, 875 mg,  Gastric (NG, OG, PEG, GT), 2x/day, Verlon Au, MD, 875 mg at 05/11/18 2220  .  aspirin chewable tablet 81 mg, 81 mg, Gastric (NG, OG, PEG, GT), Daily, Verlon Au, MD, 81 mg at 05/11/18 0830  .  chlorhexidine gluconate (PERIDEX) 0.12% mouthwash, 15 mL, Swish & Delsa Grana, 2x/day, Verlon Au, MD, 15 mL at 05/11/18 2219  .  electrolyte-A (PLASMALYTE-A) premix infusion, , Intravenous, Continuous, Verlon Au, MD, Last Rate: 100 mL/hr at 05/12/18 0400  .  enoxaparin PF (LOVENOX) 60 mg/0.6 mL SubQ injection, 0.5 mg/kg, Subcutaneous, Q12H, Verlon Au, MD, 50 mg at 05/11/18 2219  .  famotidine (PEPCID) tablet, 20 mg, Gastric (NG, OG, PEG, GT), 2x/day, Verlon Au, MD, 20 mg at 05/11/18 2219  .  fentaNYL (PF) (SUBLIMAZE) 50 mcg/mL  injection ---Cabinet Override, , , ,   .  gabapentin (NEURONTIN) 364m per 643moral liquid, 400 mg, Gastric (NG, OG, PEG, GT), 3x/day, BoVerlon AuMD, 400 mg at 05/11/18 2219  .  haloperidol (HALDOL) 5 mg/mL injection, 5 mg, Intravenous, Q6H PRN, BoVerlon AuMD, 5 mg at 05/07/18 0135  .  ipratropium-albuterol 0.5 mg-3 mg(2.5 mg base)/3 mL Solution for Nebulization, 3 mL, Nebulization, 4x/day PRN, BoVerlon AuMD  .  lanolin-oxyquin-pet, hydrophil (BAG BALM) topical ointment, , Apply Topically, 2x/day PRN, BoVerlon AuMD  .  lidocaine (PF) 1% (10 mg/mL) injection ---Cabinet Override, , , ,   .  melatonin tablet, 6 mg, Gastric (NG, OG, PEG, GT), NIGHTLY, BoVerlon AuMD, 6 mg at 05/11/18 2219  .  metoprolol (LOPRESSOR) 1 mg/mL injection, 5 mg, Intravenous, Now, BaMarquette OldMD  .  midazolam (VERSED) 1 mg/mL injection ---CaRoxanne Gates, , ,   .  INSERT & MAINTAIN PERIPHERAL IV ACCESS, , , UNTIL DISCONTINUED **AND** DRESSING CHANGE Saline Lock; DRY STERILE, , , PRN **AND** NS flush syringe, 2 mL, Intracatheter, Q8HRS, Stopped at 05/12/18 0600 **AND** NS flush syringe, 2-6 mL, Intracatheter, Q1 MIN PRN, BoVerlon AuMD  .  nutrition protein supplement 15 g  per 30 mL packet, 2 Packet, Gastric (NG, OG, PEG, GT), Daily, BoVerlon AuMD, 30 g at 05/11/18 0855  .  nystatin (NYSTOP) 100,000 units/g topical powder, , Apply Topically, 2x/day, BoVerlon AuMD  .  oxyCODONE (ROXICODONE) immediate release tablet, 2.5 mg, Gastric (NG, OG, PEG, GT), Q4H PRN, BoVerlon AuMD  .  polyethylene glycol (MIRALAX) oral packet, 17 g, Gastric (NG, OG, PEG, GT), Daily, BoVerlon AuMD, Stopped at 05/10/18 0900  .  prenatal vitamin-iron-folic acid tablet, 1 Tab, Gastric (NG, OG, PEG, GT), Daily, BoVerlon AuMD, 1 Tab at 05/11/18 0830  .  propranolol (INDERAL) tablet, 20 mg, Gastric (NG, OG, PEG, GT), 3x/day, McCoy, Skyler, MD  .  scopolamine 1 mg over 3 days transdermal patch, 1 Patch, Transdermal, Q72H, BaMarquette OldMD, 1 Patch at 05/12/18 0024  .  senna concentrate (SENNA) 52817mer 37m27mal liquid, 5 mL, Gastric (NG, OG, PEG, GT), 2x/day, BootVerlon Au, Stopped at 05/11/18 0900  .  SSIP insulin R human (HUMULIN R) 100 units/mL injection, 0-12 Units, Subcutaneous, Q4H PRN, 2 Units at 05/11/18 0628 **AND** POCT WHOLE BLOOD GLUCOSE, , , Q6H, BootVerlon Au    Today's Physical Exam:  GEN:   NAD and Appears chronically Ill  NECK:   Trach in place, on BiPap  PULM:   Breath sounds coarse, on BiPap  CV:   Regular rate and rhythm  ABD:   Non-tender, Non-distended, PEG in place  NEURO:   Tracks with eyes, responds to questions with head nods  Integumentary:  Pink, warm, and dry. Scattered dry wounds over lower extremities and left upper lip    Assessment/ Plan:   Active Hospital Problems   (*Primary Problem)    Diagnosis   . *Fall     "found down" and taken to outside hospital     . COPD (chronic obstructive pulmonary disease) (CMS HCC)     Chronic   . Thrombocytopenia Unspecified   . Hypokalemia   . Alcohol abuse   . Troponin I above reference range   . Acute respiratory failure (CMS HCC)   . Nasal fracture     Acute vs chronic     . Abrasions of multiple sites   .  Scalp hematoma   . Humeral fracture     nonop per ortho     . UTI (urinary tract infection)     At outside hospital. Repeat UA pending     . Left inguinal hernia     S/p reduction     . Anasarca     DVT prophylaxis:  Enoxaparin and SCDs/ Venodynes/Impulse boots  Anticoagulants (last 24 hours)     Date/Time Action Medication Dose    05/10/18 2106 Given    enoxaparin PF (LOVENOX) 60 mg/0.6 mL SubQ injection 50 mg    05/10/18 0826 Given    enoxaparin PF (LOVENOX) 60 mg/0.6 mL SubQ injection 50 mg        Nutrition: MNT PROTOCOL FOR DIETITIAN  ADULT TUBE FEEDING - CONTINUOUS DRIP CONTINUOUS NO MEALS, TF ONLY; OSMOLITE 1.5; OG; Initial Rate (ml/hr): 20; Increase by: 10 ml every 6 hours; Goal Rate (ml/hr): 60  DIET NPO - NOW diet, Last Bowel Movement: 05/11/18  Activity: OOB to chair   RUE nonweight bearing  Pain:   Analgesics (last 24 hours)     Date/Time Action Medication Dose    05/11/18 0511 Given    acetaminophen (TYLENOL) 160 mg per 5 mL liquid - grape flavored 650 mg    05/11/18 0044 Given    acetaminophen (TYLENOL) 160 mg per 5 mL liquid - grape flavored 650 mg        PT Recommendations: skilled nursing facility    Pneumonia   - Augmentin - until 3/28   - BAL 3/26 - pending    R Humerus Fracture   - Ortho Consult   - Non-op and NWB    KUB with air throughout the colon, mild distention   - Trickle tube feeds as tolerated   - Continue bowel regimen   - Keep HOB at 30    Supportive Care consult   - Patient currently 'No CPR'   - Will discuss goals or care/treatment limitations with MPOA      Plan:  - Wean BiPAP support as able  - Trickle tube feeds as tolerated    Jari Pigg, MD  05/12/2018, 12:44    Family Phone call    The designated family member, Saint Hank, was not able to be reached by phone for updates.  I did speak with Supportive Care earlier today who spoke with Herbie Baltimore and discussed the current status of his brother. Herbie Baltimore expressed wanting to make his brother comfortable and try to get him off of the  ventilator as quick as possible. It was agree that weaning would take place over the weekend with the goal of eventual comfort care and hospice. If Mr. Englert is unable to wean from BiPap support by Monday we will readdress comfort measures with Herbie Baltimore and Supportive Care.     Jari Pigg, MD  05/12/2018, 17:13    Trauma/Surgical Critical Care/Acute Care Surgery Staff  I saw and examined the patient.  I reviewed the Resident note.  I agree with their findings and plan of care as documented in their note.  Any exceptions/additions are edited/noted.  Transferred back to the intensive care unit due to concern for an aspiration event.  Bronchoscopy overnight with aspiration of large amount of secretions, BAL positive for neutrophils only, new bacteria.  Presently on BiPAP 20/10 to achieve adequate oxygenation and ventilation.  No significant change in chest x-ray or abdominal x-ray to explain etiology of possible aspiration event.  Remains on antibiotics for MSSA pneumonia begun  on March 24th.  Resume tube feeds with head of bed elevation greater than 30 at all time.  DVT chemoprophylaxis.  Supportive care consult for goal clarification in light of his return to the ICU on same admission and adult failure to thrive.  Electronically Signed by:    Susa Loffler, DO, FACS  Associate Professor of Surgery  Trauma, Surgical Critical Care, and Acute Care Surgery  05/13/2018 11:52

## 2018-05-12 NOTE — Progress Notes (Signed)
[facility]                                               SICU PROGRESS NOTE    Antonio Gillespie, Antonio Gillespie  Date of Admission:  04/28/2018  Date of Service: 05/12/2018  Date of Birth:  February 13, 1961    Primary Attending:  [name]  Primary Service:  Trauma Blue    Post Op Day:9 Days Post-Op S/P Procedure(s) (LRB):  TRACHEOSTOMY (N/A)  GASTROSCOPY WITH PLACEMENT PEG TUBE (N/A)     LOS: 14 days      Subjective:    Events last 24 hours: Admitted to the SICU after an aspiration event during his tube feeding. Bronchoscopy    Vital Signs:  Temp (24hrs) Max:37.1 C (18.8 F)      Systolic (41YSA), YTK:160 , Min:87 , FUX:323     Diastolic (55DDU), KGU:54, Min:55, Max:98    Temp  Avg: 36.7 C (98 F)  Min: 36.2 C (97.2 F)  Max: 37.1 C (98.8 F)  MAP (Non-Invasive)  Avg: 81.2 mmHG  Min: 66 mmHG  Max: 104 mmHG  Pulse  Avg: 98.5  Min: 91  Max: 106  Resp  Avg: 29.9  Min: 22  Max: 44  SpO2  Avg: 98.2 %  Min: 90 %  Max: 100 %  Pain Score (Numeric, Faces): 0    Meds  No current outpatient medications on file.     acetaminophen (TYLENOL) 160 mg per 5 mL liquid - grape flavored, 650 mg, Gastric (NG, OG, PEG, GT), Q4H PRN  amoxicillin-clavulanate (AUGMENTIN) 200-28.5 mg per 5 mL oral liquid, 875 mg, Gastric (NG, OG, PEG, GT), 2x/day  aspirin chewable tablet 81 mg, 81 mg, Gastric (NG, OG, PEG, GT), Daily  chlorhexidine gluconate (PERIDEX) 0.12% mouthwash, 15 mL, Swish & Spit, 2x/day  electrolyte-A (PLASMALYTE-A) premix infusion, , Intravenous, Continuous  enoxaparin PF (LOVENOX) 60 mg/0.6 mL SubQ injection, 0.5 mg/kg, Subcutaneous, Q12H  famotidine (PEPCID) tablet, 20 mg, Gastric (NG, OG, PEG, GT), 2x/day  fentaNYL (PF) (SUBLIMAZE) 50 mcg/mL injection ---Cabinet Override, , ,   gabapentin (NEURONTIN) 371m per 69moral liquid, 400 mg, Gastric (NG, OG, PEG, GT), 3x/day  haloperidol (HALDOL) 5 mg/mL injection, 5 mg, Intravenous, Q6H PRN  ipratropium-albuterol 0.5 mg-3 mg(2.5 mg base)/3 mL Solution for Nebulization, 3 mL,  Nebulization, 4x/day PRN  lanolin-oxyquin-pet, hydrophil (BAG BALM) topical ointment, , Apply Topically, 2x/day PRN  lidocaine (PF) 1% (10 mg/mL) injection ---Cabinet Override, , ,   melatonin tablet, 6 mg, Gastric (NG, OG, PEG, GT), NIGHTLY  metoprolol (LOPRESSOR) 1 mg/mL injection, 5 mg, Intravenous, Now  midazolam (VERSED) 1 mg/mL injection ---Cabinet Override, , ,   NS flush syringe, 2 mL, Intracatheter, Q8HRS    And  NS flush syringe, 2-6 mL, Intracatheter, Q1 MIN PRN  nutrition protein supplement 15 g per 30 mL packet, 2 Packet, Gastric (NG, OG, PEG, GT), Daily  nystatin (NYSTOP) 100,000 units/g topical powder, , Apply Topically, 2x/day  oxyCODONE (ROXICODONE) immediate release tablet, 2.5 mg, Gastric (NG, OG, PEG, GT), Q4H PRN  polyethylene glycol (MIRALAX) oral packet, 17 g, Gastric (NG, OG, PEG, GT), Daily  prenatal vitamin-iron-folic acid tablet, 1 Tab, Gastric (NG, OG, PEG, GT), Daily  propranolol (INDERAL) tablet, 10 mg, Gastric (NG, OG, PEG, GT), 3x/day  scopolamine 1 mg over 3 days transdermal patch, 1 Patch, Transdermal, Q72H  senna concentrate (  SENNA) 522m per 128moral liquid, 5 mL, Gastric (NG, OG, PEG, GT), 2x/day  SSIP insulin R human (HUMULIN R) 100 units/mL injection, 0-12 Units, Subcutaneous, Q4H PRN        Physical Exam:   GEWNI:[id number], frail male appears older than stated age   HEENT:Pupils 55m27milaterally,large posterior scalp hematoma  NECK:Trachea midline, tracheostomy in place  PULM:Diminished breath sounds bilaterally, course, Tracheostomy in place   CARDIAC:Afib   CHEST::No abrasions or contusions  ABDHWE:XHBZJIRft, and nondistended  PELVIS:No evidence of injury  GU/RECTAL:large scrotal edema resolving   BACK:No evidence of injury and no step-off  MS:CV:ELFYBOted on bilateral knees and feet  NEURO:Intubated, following commands  GLASGOW COMA SCALE:Eye opening:4 spontaneous, Verbal response:Trachostomy , Best motor response:6 obeys  commands  VASCULAR:All pulses palpable and equal bilaterally  INTEGUMENTARY:Pink, warm, and dry andanasarca    Labs:  I have reviewed all lab results.  Hgb 7.1 from 7.6  WBC 7.4    Recent Imaging:  CXR read pending      Assessment/ Plan:  Active Hospital Problems    Diagnosis   . Primary Problem: Fall   . COPD (chronic obstructive pulmonary disease) (CMS HCC)   . Thrombocytopenia Unspecified   . Hypokalemia   . Alcohol abuse   . Troponin I above reference range   . Acute respiratory failure (CMS HCC)   . Nasal fracture   . Abrasions of multiple sites   . Scalp hematoma   . Humeral fracture   . UTI (urinary tract infection)   . Left inguinal hernia   . Anasarca       Antonio Gillespie a 57 12o. male who is 9 Days Post-Op S/P Procedure(s) (LRB):  TRACHEOSTOMY (N/A)  GASTROSCOPY WITH PLACEMENT PEG TUBE (N/A)      NEURO:  GCS: E4=Spontaneous (Opens Eyes on Own) M6=Normal (Follows Simple Commands) V1=None (Makes No Noise or Intubated)  Sedation/analgesia:    Gabapentin 400 TID    Tylenol PRN   Haldol PRN - Not needed multiple days   Oxycodone - 2.5 Q4H PRN   Scopolamine patch added for secretions  Melatonin  Prenatal vitamin    CARDIOVASCULAR:  Systolic (24h17PZWAvgCHE:527Min:87 , MaxPOE:423  Diastolic (24h53IRWAvgERX:54in:55, Max:98     ART-Line  MAP: 85 mmHg   Troponins: No results found for: CKMB    MAP >71  HR 91-705  Episode of tachycardia treated with metoprolol  Meds: aspirin, propanolol 20 mg      PULMONARY:  Airway Ventilator Settings   Tracheostomy 8 Cuffed (Active)   Trach Status Secured 05/08/2018  3:37 AM   Site Condition Intact 05/07/2018  8:00 PM   Tra[place]velandte Cleaned 05/07/2018  8:00 PM   TraBetweenBedside? Yes 05/07/2018  8:00 PM   Bilateral General Breath Sounds Coarse;Expiratory Wheezes 05/07/2018  8:00 PM    Ventilator Settings:      SpO2  Avg: 98.2 %  Min: 90 %  Max: 100 %  Blood Gas:  No results found for this encounter  Nebs: albuterol TID, Ipratropium-albuterol,   BAL 3/21--> 30,000 S.  Aureus, <100 GNR  BiPAP settings overnight increased to 20/10 Fio2 50%    Bronchoscopy:   Copious amount of secretions  BAL collected   Continue ventilation today and overnight    GI:  MNT PROTOCOL FOR DIETITIAN  ADULT TUBE FEEDING - CONTINUOUS DRIP CONTINUOUS NO MEALS, TF ONLY; OSMOLITE 1.5; OG; Initial Rate (ml/hr): 20;  Increase by: 10 ml every 6 hours; Goal Rate (ml/hr): 60  DIET NPO - NOW   No results for input(s): PREALBUMIN, BILIRUBIN, ALBUMIN, AMYLASE, LIPASE in the last 72 hours.    Invalid input(s): PHOSPHATASE, TRANSAMINASE  Last BM: Last Bowel Movement: 05/11/18  Proph: Senokot, Pepcid, Miralax    KUB to assess for illeus then continue TF if possible    RENAL/GU:  Recent Labs     05/11/18  0406 05/11/18  1852 05/12/18  0023   SODIUM 139 139 139   POTASSIUM 3.8 4.1 4.5   CHLORIDE 100 101 102   BUN _0 CREATININE 0.54* 0.53* 0.52*   ANIONGAP _1 CALCIUM 7.9* 8.2* 7.8*   MAGNESIUM 2.0 2.1 2.1   PHOSPHORUS 4.0 5.1* 4.8*       I/O last 24 hours:      Intake/Output Summary (Last 24 hours) at 05/12/2018 0640  Last data filed at 05/12/2018 0600  Gross per 24 hour   Intake 1900 ml   Output 100 ml   Net 1800 ml     I/O current shift:  03/26 1900 - 03/27 0659  In: 1300 [I.V.:1200; GT:100]  Out: 100 [NG/OG/GT:100]  No foley  UA does not show evidence of infection   No diuresis today      HEME:  Recent Labs     05/11/18  0406 05/11/18  1852 05/12/18  0023   HGB 7.1* 7.6* 7.1*   HCT 23.7* 26.3* 24.0*   PLTCNT 404* 428* 467*     Transfusions:   Proph: SCD's, Lovenox     ID:  Temp (24hrs) Max:37.1 C (98.8 F)    Recent Labs     05/11/18  0406 05/11/18  1852 05/12/18  0023   WBC 7.2 8.0 7.4           Cultures:  ABX: Augmentin end date 3/28  No ABX  Pro-cal normal    New blood cultures drawn   3/23 - no growth  G+C negative    ENDO:  SSI    MSK:  No skin breakdown    OTHER:  Lines: peripheral IV x2, foley, gastrostomy tube  Activity: Bedrest, HOB 30deg  PT/OT:  SNF  MNT:  ordered    PLAN:  Bronchoscopy yesterday.  Will follow BAL  Not escalating ABX unless clinical signs of new infection  Continue mechanical ventilation today and overnight. ATC tomorrow  KUB and possibly resume tube feeds    Mikki Santee, MD  05/12/2018, 07:06      I saw and examined the patient.  I reviewed the resident's note.  I agree with the findings and plan of care as documented in the resident's note.  Any exceptions/additions are edited/noted.    SP fall with humerus fx, acute resp failure requiring intubation and tracheostomy, inanition requiring peg  Was on floor, aspirated overnight requiring transfer to ICU and ventilator, Bronch by overnight team and suctioning    Will maintain high PSV today. Aggressive pulm toilet and suctioning, nebs to clear secrections. On Augmentin, followup BAP and adjust abx accordingly, no signs of infection.    CXR stable     resume tube feeds    Hyponatremia 139 improved  Holding diuresis.    BAL positive for MSSA changed to Ancef yesterday, will change to Augmentin to cover possible sinusitis    Critical Care Attestation    I was present at the bedside of this critically ill patient for  45 minutes exclusive of procedures.  This patient suffers from failure or dysfunction of Pulmonary, GI/Hepatopancreaticobiliary, Renal and Immue/Allergic system(s).  The care of this patient was in regard to managing (a) conditions(s) that has a high probability of sudden, clinically significant, or life-threatening deterioration and required a high degree of Attending Physician attention and direct involvement to intervene urgently. Data review and care planning was performed in direct proximity of the patient, examination was obviously performed in direct contact with the patient. All of this time was exclusive of procedure which will be documented elsewhere in the chart.    My critical care time is independent and unique to other providers (no other providers saw patient for purposes of sicu evaluation)  My critical care time  involved full attention to the patients' condition and included:    Review of nursing notes and/or old charts  Review of medications, allergies, and vital signs  Documentation time  Consultant collaboration on findings and treatment options  Care, transfer of care, and discharge plans  Ordering, interpreting, and reviewing diagnostic studies/tab tests  Obtaining necessary history from family, EMS, nursing home staff and/or treating physicians    My critical care time did not include time spent teaching resident physician(s) or other services of resident physicians, or performing other reported procedures.  Total Critical Care Time: 45 minutes    Herschell Dimes, MD

## 2018-05-12 NOTE — Care Plan (Signed)
Patient out of bed with Maxi Move. No complaints of pain today. Remained on BiPAP, still having moderate amounts of thick creamy secretions. TF restarted at trickle rate. Episodes of incontinence. Family updated on plan of care by RN via phone. Turned and repositioned every 2 hours for skin breakdown prevention. Preventive borders in place.

## 2018-05-13 ENCOUNTER — Inpatient Hospital Stay (HOSPITAL_COMMUNITY): Payer: Medicaid Other

## 2018-05-13 DIAGNOSIS — Z930 Tracheostomy status: Secondary | ICD-10-CM

## 2018-05-13 DIAGNOSIS — R000 Tachycardia, unspecified: Secondary | ICD-10-CM

## 2018-05-13 DIAGNOSIS — R918 Other nonspecific abnormal finding of lung field: Secondary | ICD-10-CM

## 2018-05-13 LAB — CBC
HCT: 22.7 % — ABNORMAL LOW (ref 38.9–52.0)
HGB: 6.8 g/dL — CL (ref 13.4–17.5)
MCH: 27.3 pg (ref 26.0–32.0)
MCHC: 30 g/dL — ABNORMAL LOW (ref 31.0–35.5)
MCHC: 30 g/dL — ABNORMAL LOW (ref 31.0–35.5)
MCV: 91.2 fL (ref 78.0–100.0)
MPV: 11.8 fL (ref 8.7–12.5)
PLATELETS: 417 x10ˆ3/uL — ABNORMAL HIGH (ref 150–400)
RBC: 2.49 x10ˆ6/uL — ABNORMAL LOW (ref 4.50–6.10)
RDW-CV: 19.7 % — ABNORMAL HIGH (ref 11.5–15.5)
WBC: 5.5 x10ˆ3/uL (ref 3.7–11.0)

## 2018-05-13 LAB — BASIC METABOLIC PANEL
ANION GAP: 10 mmol/L (ref 4–13)
BUN/CREA RATIO: 13 (ref 6–22)
BUN: 7 mg/dL — ABNORMAL LOW (ref 8–25)
CALCIUM: 7.4 mg/dL — ABNORMAL LOW (ref 8.5–10.2)
CHLORIDE: 102 mmol/L (ref 96–111)
CO2 TOTAL: 28 mmol/L (ref 22–32)
CREATININE: 0.53 mg/dL — ABNORMAL LOW (ref 0.62–1.27)
ESTIMATED GFR: >60 mL/min/1.73mˆ2 (ref >60)
GLUCOSE: 72 mg/dL (ref 65–139)
POTASSIUM: 3.9 mmol/L (ref 3.5–5.1)
SODIUM: 140 mmol/L (ref 136–145)

## 2018-05-13 LAB — MAGNESIUM: MAGNESIUM: 2.3 mg/dL (ref 1.6–2.6)

## 2018-05-13 LAB — ADULT ROUTINE BLOOD CULTURE, SET OF 2 BOTTLES (BACTERIA AND YEAST)
BLOOD CULTURE, ROUTINE: NO GROWTH
BLOOD CULTURE, ROUTINE: NO GROWTH

## 2018-05-13 LAB — POC BLOOD GLUCOSE (RESULTS)
GLUCOSE, POC: 81 mg/dL (ref 70–105)
GLUCOSE, POC: 88 mg/dL (ref 70–105)
GLUCOSE, POC: 93 mg/dL (ref 70–105)
GLUCOSE, POC: 97 mg/dL (ref 70–105)

## 2018-05-13 LAB — PHOSPHORUS: PHOSPHORUS: 4.4 mg/dL (ref 2.4–4.7)

## 2018-05-13 MED ORDER — SODIUM CHLORIDE 0.9 % IV BOLUS
40.00 mL | INJECTION | Freq: Once | Status: AC | PRN
Start: 2018-05-13 — End: 2018-05-13

## 2018-05-13 MED ADMIN — senna leaf extract 176 mg/5 mL oral syrup: GASTROSTOMY | @ 21:00:00

## 2018-05-13 MED ADMIN — gabapentin 250 mg/5 mL oral solution: GASTROSTOMY | @ 13:00:00

## 2018-05-13 MED ADMIN — sodium chloride 0.9 % (flush) injection syringe: @ 21:00:00

## 2018-05-13 MED ADMIN — HYDROcodone 5 mg-acetaminophen 325 mg tablet: GASTROSTOMY | @ 09:00:00

## 2018-05-13 MED ADMIN — sodium chloride 0.9 % (flush) injection syringe: GASTROSTOMY | @ 21:00:00

## 2018-05-13 MED ADMIN — midazolam 1 mg/mL injection solution: GASTROSTOMY | @ 13:00:00

## 2018-05-13 MED ADMIN — polyethylene glycol 3350 17 gram oral powder packet: GASTROSTOMY | @ 15:00:00

## 2018-05-13 MED ADMIN — melatonin 3 mg tablet: GASTROSTOMY | @ 21:00:00

## 2018-05-13 MED ADMIN — gabapentin 250 mg/5 mL oral solution: GASTROSTOMY | @ 21:00:00

## 2018-05-13 MED ADMIN — propranoloL 20 mg tablet: GASTROSTOMY | @ 08:00:00

## 2018-05-13 MED ADMIN — folic acid-vit B6-vit B12 2.2 mg-25 mg-0.5 mg tablet: GASTROSTOMY | @ 09:00:00

## 2018-05-13 MED ADMIN — oxyCODONE 5 mg tablet: GASTROSTOMY | @ 03:00:00

## 2018-05-13 MED ADMIN — sodium chloride 0.9 % (flush) injection syringe: ORAL | @ 09:00:00

## 2018-05-13 MED ADMIN — oxyCODONE-acetaminophen 5 mg-325 mg tablet: SUBCUTANEOUS | @ 21:00:00

## 2018-05-13 MED ADMIN — haloperidol lactate 5 mg/mL injection solution: SUBCUTANEOUS | @ 09:00:00

## 2018-05-13 MED ADMIN — amLODIPine 5 mg tablet: @ 07:00:00

## 2018-05-13 MED ADMIN — dextrose 50 % in water (D50W) intravenous syringe: GASTROSTOMY | @ 21:00:00

## 2018-05-13 MED ADMIN — sodium chloride 0.9 % intravenous solution: GASTROSTOMY | @ 21:00:00

## 2018-05-13 MED ADMIN — lidocaine 4 % topical patch: TOPICAL | @ 09:00:00

## 2018-05-13 MED ADMIN — sodium chloride 0.9 % (flush) injection syringe: GASTROSTOMY | @ 09:00:00

## 2018-05-13 MED ADMIN — amoxicillin 250 mg/5 mL oral suspension: @ 14:00:00

## 2018-05-13 NOTE — Care Plan (Signed)
No complaints of pain today. PRN tylenol given for slight fever. Remains on BiPAP. Trickle tube feeds continued. Patient remains incontinent of stool and urine. Turned and repositioned every two hours and preventive borders in place. Family updated by RN and physicians.

## 2018-05-13 NOTE — Progress Notes (Signed)
[facility]                                               SICU PROGRESS NOTE    Antonio Gillespie, Antonio Gillespie  Date of Admission:  04/28/2018  Date of Service: 05/13/2018  Date of Birth:  1960-11-24    Primary Attending:  [name]  Primary Service:  Trauma Blue    Post Op Day:10 Days Post-Op S/P Procedure(s) (LRB):  TRACHEOSTOMY (N/A)  GASTROSCOPY WITH PLACEMENT PEG TUBE (N/A)     LOS: 15 days      Subjective:    Events last 24 hours: Contacted supportive care who contacted patient's brother regarding goals of care. It was agreed that the patient is to be DNR/DNI with no laboratory studies, no routine diagnostic tests, and no ICU transfer if moved out of ICU. Goals of care were to wean ventilator and re-assess on Monday. 1 U pRBCs was ordered for low Hb. Patient was also tachycardic with 2 episodes of HR >180 in AM.    Vital Signs:  Temp (24hrs) Max:38.2 C (601.5 F)      Systolic (61BPP), HKF:27 , Min:74 , MDY:709     Diastolic (29VFM), BBU:03, Min:54, Max:87    Temp  Avg: 37.7 C (99.9 F)  Min: 37.2 C (99 F)  Max: 38.2 C (100.8 F)  MAP (Non-Invasive)  Avg: 77.4 mmHG  Min: 64 mmHG  Max: 95 mmHG  Pulse  Avg: 90.8  Min: 74  Max: 109  Resp  Avg: 26  Min: 22  Max: 32  SpO2  Avg: 94.1 %  Min: 89 %  Max: 100 %  Pain Score (Numeric, Faces): 0    Meds  No current outpatient medications on file.     acetaminophen (TYLENOL) 160 mg per 5 mL liquid - grape flavored, 650 mg, Gastric (NG, OG, PEG, GT), Q4H PRN  amoxicillin-clavulanate (AUGMENTIN) 200-28.5 mg per 5 mL oral liquid, 875 mg, Gastric (NG, OG, PEG, GT), 2x/day  aspirin chewable tablet 81 mg, 81 mg, Gastric (NG, OG, PEG, GT), Daily  chlorhexidine gluconate (PERIDEX) 0.12% mouthwash, 15 mL, Swish & Spit, 2x/day  electrolyte-A (PLASMALYTE-A) premix infusion, , Intravenous, Continuous  enoxaparin PF (LOVENOX) 60 mg/0.6 mL SubQ injection, 0.5 mg/kg, Subcutaneous, Q12H  famotidine (PEPCID) tablet, 20 mg, Gastric (NG, OG, PEG, GT), 2x/day  gabapentin (NEURONTIN)  317m per 616moral liquid, 400 mg, Gastric (NG, OG, PEG, GT), 3x/day  haloperidol (HALDOL) 5 mg/mL injection, 5 mg, Intravenous, Q6H PRN  ipratropium-albuterol 0.5 mg-3 mg(2.5 mg base)/3 mL Solution for Nebulization, 3 mL, Nebulization, 4x/day PRN  lanolin-oxyquin-pet, hydrophil (BAG BALM) topical ointment, , Apply Topically, 2x/day PRN  melatonin tablet, 6 mg, Gastric (NG, OG, PEG, GT), NIGHTLY  NS flush syringe, 2 mL, Intracatheter, Q8HRS    And  NS flush syringe, 2-6 mL, Intracatheter, Q1 MIN PRN  nutrition protein supplement 15 g per 30 mL packet, 2 Packet, Gastric (NG, OG, PEG, GT), Daily  nystatin (NYSTOP) 100,000 units/g topical powder, , Apply Topically, 2x/day  oxyCODONE (ROXICODONE) immediate release tablet, 2.5 mg, Gastric (NG, OG, PEG, GT), Q4H PRN  polyethylene glycol (MIRALAX) oral packet, 17 g, Gastric (NG, OG, PEG, GT), Daily  prenatal vitamin-iron-folic acid tablet, 1 Tab, Gastric (NG, OG, PEG, GT), Daily  propranolol (INDERAL) tablet, 20 mg, Gastric (NG, OG, PEG, GT), 3x/day  scopolamine 1 mg over  3 days transdermal patch, 1 Patch, Transdermal, Q72H  senna concentrate (SENNA) 580m per 156moral liquid, 5 mL, Gastric (NG, OG, PEG, GT), 2x/day  SSIP insulin R human (HUMULIN R) 100 units/mL injection, 0-12 Units, Subcutaneous, Q4H PRN        Physical Exam:   GEGUY:[id number], frail male appears older than stated age   HEENT:Pupils reactive bilaterally large posterior scalp hematoma  NECK:Trachea midline, tracheostomy in place  PULM:Diminished breath sounds bilaterally, course, Tracheostomy in place   CARDIAC:Afib   CHEST::No abrasions or contusions  ABLOV:FIEPPIRoft, and nondistended  PELVIS:No evidence of injury  GU/RECTAL:large scrotal edema resolving   BACK:No evidence of injury and no step-off  MSJJ:OACZYSoted on bilateral knees and feet  NEURO:Intubated, following commands  GLASGOW COMA SCALE:Eye opening:4 spontaneous, Verbal response:1Trachostomy , Best motor  response:6 obeys commands  VASCULAR:All pulses palpable and equal bilaterally  INTEGUMENTARY:Pink, warm, and dry andanasarca    Labs:  I have reviewed all lab results.  Hgb 6.8 from 7.1  WBC 5.5        Assessment/ Plan:  Active Hospital Problems    Diagnosis   . Primary Problem: Fall   . COPD (chronic obstructive pulmonary disease) (CMS HCC)   . Thrombocytopenia Unspecified   . Hypokalemia   . Alcohol abuse   . Troponin I above reference range   . Acute respiratory failure (CMS HCC)   . Nasal fracture   . Abrasions of multiple sites   . Scalp hematoma   . Humeral fracture   . UTI (urinary tract infection)   . Left inguinal hernia   . Anasarca       Antonio Gillespie a [age].o. male who is 10 Days Post-Op S/P Procedure(s) (LRB):  TRACHEOSTOMY (N/A)  GASTROSCOPY WITH PLACEMENT PEG TUBE (N/A)      NEURO:  GCS: E4=Spontaneous (Opens Eyes on Own) M6=Normal (Follows Simple Commands) V1=None (Makes No Noise or Intubated)  Sedation/analgesia:    Gabapentin 400 TID    Tylenol PRN   Haldol PRN - Not needed multiple days   Oxycodone - 2.5 Q4H PRN   Scopolamine patch added for secretions  Melatonin  Prenatal vitamin    CARDIOVASCULAR:  Systolic (2406TKZ AvSWF:09 Min:74 , MaNAT:557   Diastolic (2432KGU AvRKY:70Min:54, Max:87     ART-Line  MAP: 85 mmHg   Troponins: No results found for: CKMB    MAP >71  HR 91-705  Episode of tachycardia treated with metoprolol  Meds: aspirin, propanolol 20 mg      PULMONARY:  Airway Ventilator Settings   Tracheostomy 8 Cuffed (Active)   Trach Status Secured 05/08/2018  3:37 AM   Site Condition Intact 05/07/2018  8:00 PM   Tr[place] Cleaned 05/07/2018  8:00 PM   Tr[place] Bedside? Yes 05/07/2018  8:00 PM   Bilateral General Breath Sounds Coarse;Expiratory Wheezes 05/07/2018  8:00 PM    Ventilator Settings:      SpO2  Avg: 94.1 %  Min: 89 %  Max: 100 %  Blood Gas:  No results found for this encounter  Nebs: albuterol TID, Ipratropium-albuterol,   BAL 3/21--> 30,000 S. Aureus, <100  GNR  BiPAP settings overnight increased to 20/10 Fio2 50%    Bronchoscopy:   Copious amount of secretions  BAL collected   No organisms seen    GI:  MNT PROTOCOL FOR DIETITIAN  DIET NPO - NOW  ADULT TUBE FEEDING - CONTINUOUS DRIP CONTINUOUS NO MEALS, TF  ONLY; OSMOLITE 1.5; OG; Initial Rate (ml/hr): 10; Increase by: 0; Goal Rate (ml/hr): 60   No results for input(s): PREALBUMIN, BILIRUBIN, ALBUMIN, AMYLASE, LIPASE in the last 72 hours.    Invalid input(s): PHOSPHATASE, TRANSAMINASE  Last BM: Last Bowel Movement: 05/13/18  Proph: Senokot, Pepcid, Miralax    Continue tube feeds    RENAL/GU:  Recent Labs     05/11/18  1852 05/12/18  0023 05/13/18  0017   SODIUM 139 139 140   POTASSIUM 4.1 4.5 3.9   CHLORIDE 101 102 102   BUN 10 9 7*   CREATININE 0.53* 0.52* 0.53*   ANIONGAP _0 CALCIUM 8.2* 7.8* 7.4*   MAGNESIUM 2.1 2.1 2.3   PHOSPHORUS 5.1* 4.8* 4.4       I/O last 24 hours:      Intake/Output Summary (Last 24 hours) at 05/13/2018 1001  Last data filed at 05/13/2018 1000  Gross per 24 hour   Intake 3244 ml   Output --   Net 3244 ml     I/O current shift:  03/28 0700 - 03/28 1859  In: 904 [I.V.:450; Blood:314; GT:140]  Out: -   No foley  UA does not show evidence of infection   No diuresis today      HEME:  Recent Labs     05/11/18  1852 05/12/18  0023 05/13/18  0017   HGB 7.6* 7.1* 6.8*   HCT 26.3* 24.0* 22.7*   PLTCNT 428* 467* 417*     Transfusions:   Proph: SCD's, Lovenox     ID:  Temp (24hrs) Max:38.2 C (100.8 F)    Recent Labs     05/11/18  1852 05/12/18  0023 05/13/18  0017   WBC 8.0 7.4 5.5           Cultures:  ABX: Augmentin end date 3/28  No ABX  Pro-cal normal    New blood cultures drawn   3/23 - no growth  G+C negative    ENDO:  SSI    MSK:  No skin breakdown    OTHER:  Lines: peripheral IV x2, foley, gastrostomy tube  Activity: Bedrest, HOB 30deg  PT/OT:  SNF  MNT:  ordered    PLAN:  Continue to wean BiPAP and continue ATC  Given 1U pRBCs for anemia  Will re-assess Monday regarding goals of  care      Antonio Nanas, MD      Late entry for 05/13/18. I saw and examined the patient.  I reviewed the resident's note.  I agree with the findings and plan of care as documented in the resident's note.  Any exceptions/additions are edited/noted.    SP fall with humerus fx, acute resp failure requiring intubation and tracheostomy, inanition requiring peg  Was on floor, aspirated with resp distress requiring transfer to ICU and ventilator  Will maintain high PSV today. Aggressive pulm toilet and suctioning, nebs to clear secrections. On Augmentin, followup BAL and adjust abx accordingly, no signs of infection.    CXR stable     resume tube feeds    Hyponatremia 140 improved  Holding diuresis.    Family discussion on goals of care.     Critical Care Attestation    I was present at the bedside of this critically ill patient for 30 minutes exclusive of procedures.  This patient suffers from failure or dysfunction of Neurologic/Sensory, Pulmonary, Renal and Immue/Allergic system(s).  The care of this patient was in regard to  managing (a) conditions(s) that has a high probability of sudden, clinically significant, or life-threatening deterioration and required a high degree of Attending Physician attention and direct involvement to intervene urgently. Data review and care planning was performed in direct proximity of the patient, examination was obviously performed in direct contact with the patient. All of this time was exclusive of procedure which will be documented elsewhere in the chart.    My critical care time is independent and unique to other providers (no other providers saw patient for purposes of sicu evaluation)  My critical care time involved full attention to the patients' condition and included:    Review of nursing notes and/or old charts  Review of medications, allergies, and vital signs  Documentation time  Consultant collaboration on findings and treatment options  Care, transfer of care, and  discharge plans  Ordering, interpreting, and reviewing diagnostic studies/tab tests  Obtaining necessary history from family, EMS, nursing home staff and/or treating physicians    My critical care time did not include time spent teaching resident physician(s) or other services of resident physicians, or performing other reported procedures.  Total Critical Care Time: 30 minutes    Antonio Dimes, MD

## 2018-05-13 NOTE — Respiratory Therapy (Signed)
Patients settings have been increased and then decreased throughout the day, secondary to low vt's and intermittent distress.  Patient has been febrile.  Continue to wean as tolerated.  Prn duo nebs.

## 2018-05-13 NOTE — Progress Notes (Deleted)
Trauma/Surgical Critical Care/Acute Care Surgery Staff  I saw and examined the patient.  I reviewed the Resident note.  I agree with their findings and plan of care as documented in their note.  Any exceptions/additions are edited/noted.  No acute events reported overnight.  Episode of tachycardia this am managed by SICU with metoprolol  Lung sounds remain coarse  Trach and PEG sites c/d/i  Appreciate supportive care input - continue to wean BiPAP as tolerated, presently DNR with no escalation, if not able to make progress with vent, family requests transition to [facility]  Electronically Signed by:    Harden Mo, DO, FACS  Associate Professor of Surgery  Trauma, Surgical Critical Care, and Acute Care Surgery  05/13/2018 10:05

## 2018-05-13 NOTE — Progress Notes (Signed)
[facility]                                            Trauma Progress Note                 Date of Birth:  12/05/1960  Date of Admission:  04/28/2018  Date of service: 05/13/2018    Antonio Gillespie, 58 y.o., male Post trauma day 15 status post Fall    Events over the last 24 hours have included:  Weaning on BiPap  G-tube feeds restarted    Subjective:    Remains on BiPap via tracheostomy currently. Awakens to stimuli, follows simple commands. Denies abdominal pain.     Objective   24 Hour Summary:    Filed Vitals:    05/13/18 0702 05/13/18 0715 05/13/18 0800 05/13/18 0900   BP:  (!) 100/59 104/86 98/68   Pulse:  96 90 (!) 104   Resp:  (!) 25 (!) 23 (!) 23   Temp: 37.7 C (99.9 F)  (!) 38.1 C (100.6 F)    SpO2:  91% 94% 97%     Labs:  Recent Labs     05/11/18  1852 05/12/18  0023 05/13/18  0017   WBC 8.0 7.4 5.5   HGB 7.6* 7.1* 6.8*   HCT 26.3* 24.0* 22.7*   SODIUM 139 139 140   POTASSIUM 4.1 4.5 3.9   CHLORIDE 101 102 102   BUN 10 9 7*   CREATININE 0.53* 0.52* 0.53*   ANIONGAP _0 CALCIUM 8.2* 7.8* 7.4*   MAGNESIUM 2.1 2.1 2.3   PHOSPHORUS 5.1* 4.8* 4.4       Intake/Output Summary (Last 24 hours) at 05/13/2018 1030  Last data filed at 05/13/2018 1000  Gross per 24 hour   Intake 3244 ml   Output --   Net 3244 ml     Nutrition Management: MNT PROTOCOL FOR DIETITIAN  DIET NPO - NOW  ADULT TUBE FEEDING - CONTINUOUS DRIP CONTINUOUS NO MEALS, TF ONLY; OSMOLITE 1.5; OG; Initial Rate (ml/hr): 10; Increase by: 0; Goal Rate (ml/hr): 60 Last Bowel Movement: 05/13/18  No results for input(s): ALBUMIN, PREALBUMIN in the last 72 hours.  Current Medications:    Current Facility-Administered Medications:   .  acetaminophen (TYLENOL) 160 mg per 5 mL liquid - grape flavored, 650 mg, Gastric (NG, OG, PEG, GT), Q4H PRN, Verlon Au, MD, 650 mg at 05/11/18 0511  .  amoxicillin-clavulanate (AUGMENTIN) 200-28.5 mg per 5 mL oral liquid, 875 mg, Gastric (NG, OG, PEG, GT), 2x/day, Verlon Au, MD, 875 mg at 05/13/18  0835  .  aspirin chewable tablet 81 mg, 81 mg, Gastric (NG, OG, PEG, GT), Daily, Verlon Au, MD, 81 mg at 05/13/18 0831  .  chlorhexidine gluconate (PERIDEX) 0.12% mouthwash, 15 mL, Swish & Delsa Grana, 2x/day, Verlon Au, MD, 15 mL at 05/13/18 0831  .  electrolyte-A (PLASMALYTE-A) premix infusion, , Intravenous, Continuous, Verlon Au, MD, Last Rate: 100 mL/hr at 05/12/18 2248  .  enoxaparin PF (LOVENOX) 60 mg/0.6 mL SubQ injection, 0.5 mg/kg, Subcutaneous, Q12H, Verlon Au, MD, 50 mg at 05/13/18 0835  .  famotidine (PEPCID) tablet, 20 mg, Gastric (NG, OG, PEG, GT), 2x/day, Verlon Au, MD, 20 mg at 05/13/18 0831  .  gabapentin (NEURONTIN) 353m per 635moral liquid, 400 mg, Gastric (NG, OG, PEG, GT), 3x/day,  Verlon Au, MD, 400 mg at 05/13/18 0831  .  haloperidol (HALDOL) 5 mg/mL injection, 5 mg, Intravenous, Q6H PRN, Verlon Au, MD, 5 mg at 05/07/18 0135  .  ipratropium-albuterol 0.5 mg-3 mg(2.5 mg base)/3 mL Solution for Nebulization, 3 mL, Nebulization, 4x/day PRN, Verlon Au, MD, 3 mL at 05/13/18 0407  .  lanolin-oxyquin-pet, hydrophil (BAG BALM) topical ointment, , Apply Topically, 2x/day PRN, Verlon Au, MD  .  melatonin tablet, 6 mg, Gastric (NG, OG, PEG, GT), NIGHTLY, Verlon Au, MD, 6 mg at 05/12/18 2101  .  INSERT & MAINTAIN PERIPHERAL IV ACCESS, , , UNTIL DISCONTINUED **AND** DRESSING CHANGE [place]; DRY STERILE, , , PRN **AND** NS flush syringe, 2 mL, Intracatheter, Q8HRS, 10 mL at 05/13/18 0648 **AND** NS flush syringe, 2-6 mL, Intracatheter, Q1 MIN PRN, Verlon Au, MD  .  nutrition protein supplement 15 g per 30 mL packet, 2 Packet, Gastric (NG, OG, PEG, GT), Daily, Verlon Au, MD, 30 g at 05/13/18 2831  .  nystatin (NYSTOP) 100,000 units/g topical powder, , Apply Topically, 2x/day, Verlon Au, MD  .  oxyCODONE (ROXICODONE) immediate release tablet, 2.5 mg, Gastric (NG, OG, PEG, GT), Q4H PRN, Verlon Au, MD, 2.5 mg at 05/13/18 0310  .  polyethylene  glycol (MIRALAX) oral packet, 17 g, Gastric (NG, OG, PEG, GT), Daily, Verlon Au, MD, Stopped at 05/10/18 0900  .  prenatal vitamin-iron-folic acid tablet, 1 Tab, Gastric (NG, OG, PEG, GT), Daily, Verlon Au, MD, 1 Tab at 05/13/18 0900  .  propranolol (INDERAL) tablet, 20 mg, Gastric (NG, OG, PEG, GT), 3x/day, Thimm, Terra, MD, Stopped at 05/12/18 2200  .  scopolamine 1 mg over 3 days transdermal patch, 1 Patch, Transdermal, Q72H, Marquette Old, MD, 1 Patch at 05/12/18 0024  .  senna concentrate (SENNA) 55m per 173moral liquid, 5 mL, Gastric (NG, OG, PEG, GT), 2x/day, BoVerlon AuMD, 176 mg at 05/13/18 0830  .  SSIP insulin R human (HUMULIN R) 100 units/mL injection, 0-12 Units, Subcutaneous, Q4H PRN, 2 Units at 05/11/18 0628 **AND** POCT WHOLE BLOOD GLUCOSE, , , Q6H, BoVerlon AuMD    Today's Physical Exam:  GEN:   NAD and Appears chronically Ill  NECK:   Trach in place, on BiPap  PULM:   Breath sounds coarse, on BiPap  CV:   Regular rate and rhythm  ABD:   Non-tender, Non-distended, PEG in place. Large left inguinal hernia, reducible, no erythema of overlying skin  NEURO:  Awakens to stimuli, less interactive than previously  Integumentary:  Pink, warm, and dry. Scattered dry wounds over lower extremities and left upper lip    Assessment/ Plan:   Active Hospital Problems   (*Primary Problem)    Diagnosis   . *Fall     "found down" and taken to outside hospital     . COPD (chronic obstructive pulmonary disease) (CMS HCC)     Chronic   . Thrombocytopenia Unspecified   . Hypokalemia   . Alcohol abuse   . Troponin I above reference range   . Acute respiratory failure (CMS HCC)   . Nasal fracture     Acute vs chronic     . Abrasions of multiple sites   . Scalp hematoma   . Humeral fracture     nonop per ortho     . UTI (urinary tract infection)     At outside hospital. Repeat UA pending     . Left inguinal hernia     S/p reduction     .  Anasarca     DVT prophylaxis:  Enoxaparin and SCDs/  Venodynes/Impulse boots  Anticoagulants (last 24 hours)     Date/Time Action Medication Dose    05/10/18 2106 Given    enoxaparin PF (LOVENOX) 60 mg/0.6 mL SubQ injection 50 mg    05/10/18 0826 Given    enoxaparin PF (LOVENOX) 60 mg/0.6 mL SubQ injection 50 mg        Nutrition: MNT PROTOCOL FOR DIETITIAN  DIET NPO - NOW  ADULT TUBE FEEDING - CONTINUOUS DRIP CONTINUOUS NO MEALS, TF ONLY; OSMOLITE 1.5; OG; Initial Rate (ml/hr): 10; Increase by: 0; Goal Rate (ml/hr): 60 diet, Last Bowel Movement: 05/13/18  Activity: OOB to chair   RUE nonweight bearing  Pain:   Analgesics (last 24 hours)     Date/Time Action Medication Dose    05/11/18 0511 Given    acetaminophen (TYLENOL) 160 mg per 5 mL liquid - grape flavored 650 mg    05/11/18 0044 Given    acetaminophen (TYLENOL) 160 mg per 5 mL liquid - grape flavored 650 mg        PT Recommendations: skilled nursing facility    Pneumonia   - Augmentin - until 3/28   - BAL 3/26 - PMNs, no organisms    R Humerus Fracture   - Ortho Consult   - Non-op and NWB    KUB with air throughout the colon, mild distention   - Trickle tube feeds as tolerated   - Continue bowel regimen   - Keep HOB at 30    Supportive Care consult   - Patient currently no CPR, no intubation, no lab studies/diagnostic tests   - Discussed plan to continue to wean off BiPAP over the weekend with possible transition to [place] on Monday if unable to wean   - Will call MPOA to further discuss care     Plan:  - Wean BiPAP support as able  - Trickle tube feeds as tolerated    Jari Pigg, MD  05/13/2018, 10:31    Trauma/Surgical Critical Care/Acute Care Surgery Staff  I saw and examined the patient.  I reviewed the Resident note.  I agree with their findings and plan of care as documented in their note.  Any exceptions/additions are edited/noted.  No acute events reported overnight.  Episode of tachycardia this am managed by SICU with metoprolol  Lung sounds remain coarse  Trach and PEG sites c/d/i  Appreciate supportive  care input - continue to wean BiPAP as tolerated, presently DNR with no escalation, if not able to make progress with vent, family requests transition to [place]  Electronically Signed by:     Susa Loffler, DO, FACS  Associate Professor of Surgery  Trauma, Surgical Critical Care, and Acute Care Surgery  05/13/2018 10:05

## 2018-05-13 NOTE — Nurses Notes (Addendum)
Pt tachycardic into 190s briefly x2. SICU [place] notified. Will place order for mtoprolol and await results after unit blood given

## 2018-05-13 NOTE — Respiratory Therapy (Signed)
Patient resting on BIPAP with settings of 16/6 and 40%. Patient experienced increased oxygen requirements throughout the night. BBS coarse and diminished on the right and diminished on the left with inspiratory wheezes. Administered a PRN breathing treatment to patient. Will attempt ATC trials with patient if tolerated today. Continues to receive specialty bed for pulmonary toileting. Suctioning for small to moderate amounts of thick white/pale yellow secretions. RT will continue to monitor at this time.

## 2018-05-13 NOTE — Progress Notes (Signed)
Family Phone call    The designated family member was able to be reached by phone for updates.  An update of the patient's status was reviewed and all questions were answered.    Karma Greaser, MD  05/13/2018, 12:09    Electronically Signed by:    Harden Mo, DO, FACS  Associate Professor of Surgery  Trauma, Surgical Critical Care, and Acute Care Surgery  05/13/2018 12:11

## 2018-05-14 ENCOUNTER — Inpatient Hospital Stay (HOSPITAL_COMMUNITY): Payer: Medicaid Other

## 2018-05-14 DIAGNOSIS — Z930 Tracheostomy status: Secondary | ICD-10-CM

## 2018-05-14 DIAGNOSIS — I517 Cardiomegaly: Secondary | ICD-10-CM

## 2018-05-14 DIAGNOSIS — I499 Cardiac arrhythmia, unspecified: Secondary | ICD-10-CM

## 2018-05-14 DIAGNOSIS — J9811 Atelectasis: Secondary | ICD-10-CM

## 2018-05-14 LAB — BASIC METABOLIC PANEL
ANION GAP: 11 mmol/L (ref 4–13)
ANION GAP: 11 mmol/L (ref 4–13)
BUN/CREA RATIO: 9 (ref 6–22)
BUN: 6 mg/dL — ABNORMAL LOW (ref 8–25)
CALCIUM: 8.1 mg/dL — ABNORMAL LOW (ref 8.5–10.2)
CALCIUM: 8.1 mg/dL — ABNORMAL LOW (ref 8.5–10.2)
CHLORIDE: 101 mmol/L (ref 96–111)
CO2 TOTAL: 27 mmol/L (ref 22–32)
CREATININE: 0.7 mg/dL (ref 0.62–1.27)
ESTIMATED GFR: >60 mL/min/1.73mˆ2 (ref >60)
GLUCOSE: 114 mg/dL (ref 65–139)
POTASSIUM: 3.7 mmol/L (ref 3.5–5.1)
SODIUM: 139 mmol/L (ref 136–145)

## 2018-05-14 LAB — CBC WITH DIFF
BASOPHIL #: <0.1 x10ˆ3/uL (ref <=0.20)
BASOPHIL %: 0 %
EOSINOPHIL #: <0.1 x10ˆ3/uL (ref <=0.50)
EOSINOPHIL %: 1 %
HCT: 29.6 % — ABNORMAL LOW (ref 38.9–52.0)
HGB: 8.7 g/dL — ABNORMAL LOW (ref 13.4–17.5)
IMMATURE GRANULOCYTE #: <0.1 x10ˆ3/uL (ref <0.10)
IMMATURE GRANULOCYTE %: 0 % (ref 0–1)
LYMPHOCYTE #: 2.08 10*3/uL (ref 1.00–4.80)
LYMPHOCYTE %: 19 %
MCH: 26.6 pg (ref 26.0–32.0)
MCHC: 29.4 g/dL — ABNORMAL LOW (ref 31.0–35.5)
MCV: 90.5 fL (ref 78.0–100.0)
MONOCYTE #: 1.21 x10ˆ3/uL — ABNORMAL HIGH (ref 0.20–1.10)
MONOCYTE %: 11 %
MPV: 11.2 fL (ref 8.7–12.5)
NEUTROPHIL #: 7.76 x10ˆ3/uL — ABNORMAL HIGH (ref 1.50–7.70)
NEUTROPHIL %: 69 %
NEUTROPHIL %: 69 %
PLATELETS: 591 x10ˆ3/uL — ABNORMAL HIGH (ref 150–400)
RBC: 3.27 10*6/uL — ABNORMAL LOW (ref 4.50–6.10)
RDW-CV: 19 % — ABNORMAL HIGH (ref 11.5–15.5)
WBC: 11.2 10*3/uL — ABNORMAL HIGH (ref 3.7–11.0)
WBC: 11.2 x10?3/uL — ABNORMAL HIGH (ref 3.7–11.0)

## 2018-05-14 LAB — POC BLOOD GLUCOSE (RESULTS)
GLUCOSE, POC: 120 mg/dL — ABNORMAL HIGH (ref 70–105)
GLUCOSE, POC: 87 mg/dL (ref 70–105)
GLUCOSE, POC: 89 mg/dL (ref 70–105)
GLUCOSE, POC: 92 mg/dL (ref 70–105)

## 2018-05-14 LAB — ARTERIAL BLOOD GAS/LACTATE
%FIO2 (ARTERIAL): 30 %
BASE EXCESS (ARTERIAL): 0.1 mmol/L (ref 0.0–1.0)
BICARBONATE (ARTERIAL): 25.1 mmol/L (ref 18.0–26.0)
LACTATE: 3.6 mmol/L — ABNORMAL HIGH (ref 0.0–1.3)
PAO2/FIO2 RATIO: 647 (ref <=200)
PCO2 (ARTERIAL): 35 mmHg (ref 35.0–45.0)
PH (ARTERIAL): 7.44 (ref 7.35–7.45)
PO2 (ARTERIAL): 194 mmHg — ABNORMAL HIGH (ref 72.0–100.0)

## 2018-05-14 LAB — ECG 12-LEAD
Atrial Rate: 192 {beats}/min
Calculated R Axis: 23 degrees
Calculated T Axis: -178 degrees
QRS Duration: 84 ms
QT Interval: 288 ms
QTC Calculation: 485 ms
Ventricular rate: 171 {beats}/min

## 2018-05-14 LAB — PHOSPHORUS: PHOSPHORUS: 5 mg/dL — ABNORMAL HIGH (ref 2.4–4.7)

## 2018-05-14 LAB — HEPATIC FUNCTION PANEL
ALBUMIN: 1.6 g/dL — ABNORMAL LOW (ref 3.5–5.0)
ALKALINE PHOSPHATASE: 235 U/L — ABNORMAL HIGH (ref 45–115)
ALT (SGPT): 23 U/L (ref <55)
AST (SGOT): 43 U/L (ref 8–48)
BILIRUBIN DIRECT: 0.8 mg/dL — ABNORMAL HIGH (ref <0.3)
BILIRUBIN TOTAL: 1.3 mg/dL (ref 0.3–1.3)
PROTEIN TOTAL: 6.4 g/dL (ref 6.4–8.3)

## 2018-05-14 LAB — BRONCHOALVEOLAR LAVAGE (BAL) QUANTITATIVE CULTURE/GRAM STAIN
BRONCHOALVEOLAR LAVAGE (BAL) QUANTITATIVE CULTURE: NO GROWTH
GRAM STAIN: NONE SEEN

## 2018-05-14 LAB — MAGNESIUM: MAGNESIUM: 2.2 mg/dL (ref 1.6–2.6)

## 2018-05-14 LAB — TROPONIN-I: TROPONIN I: 12 ng/L (ref 0–30)

## 2018-05-14 MED ORDER — METOPROLOL TARTRATE 5 MG/5 ML INTRAVENOUS SOLUTION
5.0000 mg | Freq: Once | INTRAVENOUS | Status: DC
Start: 2018-05-14 — End: 2018-05-15

## 2018-05-14 MED ORDER — LORAZEPAM 2 MG/ML INJECTION SOLUTION
INTRAMUSCULAR | Status: AC
Start: 2018-05-14 — End: 2018-05-14
  Administered 2018-05-14: 2 mg via INTRAVENOUS
  Filled 2018-05-14: qty 1

## 2018-05-14 MED ORDER — HYDROMORPHONE 2 MG/ML INJECTION SYRINGE
INJECTION | INTRAMUSCULAR | Status: AC
Start: 2018-05-14 — End: 2018-05-14
  Administered 2018-05-14: 0.2 mg via INTRAVENOUS
  Filled 2018-05-14: qty 1

## 2018-05-14 MED ORDER — ELECTROLYTE-A INTRAVENOUS SOLUTION BOLUS
500.0000 mL | Freq: Once | INTRAVENOUS | Status: AC
Start: 2018-05-14 — End: 2018-05-14
  Administered 2018-05-14: 500 mL via INTRAVENOUS
  Administered 2018-05-14: 0 mL via INTRAVENOUS

## 2018-05-14 MED ORDER — METOPROLOL TARTRATE 5 MG/5 ML INTRAVENOUS SOLUTION
INTRAVENOUS | Status: AC
Start: 2018-05-14 — End: 2018-05-14
  Administered 2018-05-14: 5 mg via INTRAVENOUS
  Filled 2018-05-14: qty 5

## 2018-05-14 MED ORDER — HYDROMORPHONE 2 MG/ML INJECTION SYRINGE
0.2000 mg | INJECTION | INTRAMUSCULAR | Status: AC
Start: 2018-05-14 — End: 2018-05-14

## 2018-05-14 MED ORDER — DEXTROSE 5 % IN WATER (D5W) INTRAVENOUS SOLUTION
150.0000 mg | Freq: Once | INTRAVENOUS | Status: AC
Start: 2018-05-14 — End: 2018-05-14
  Administered 2018-05-14: 150 mg via INTRAVENOUS
  Administered 2018-05-14: 0 mg via INTRAVENOUS
  Filled 2018-05-14: qty 3

## 2018-05-14 MED ORDER — METOPROLOL TARTRATE 5 MG/5 ML INTRAVENOUS SOLUTION
5.0000 mg | INTRAVENOUS | Status: AC
Start: 2018-05-14 — End: 2018-05-14

## 2018-05-14 MED ORDER — METOPROLOL TARTRATE 12.5 MG HALF TAB
12.5000 mg | ORAL_TABLET | Freq: Two times a day (BID) | ORAL | Status: DC
Start: 2018-05-14 — End: 2018-05-25
  Administered 2018-05-14 – 2018-05-25 (×22): 12.5 mg via GASTROSTOMY
  Filled 2018-05-14 (×24): qty 1

## 2018-05-14 MED ORDER — METOPROLOL TARTRATE 5 MG/5 ML INTRAVENOUS SOLUTION
5.0000 mg | Freq: Once | INTRAVENOUS | Status: DC
Start: 2018-05-14 — End: 2018-05-15
  Filled 2018-05-14: qty 5

## 2018-05-14 MED ORDER — LORAZEPAM 2 MG/ML INJECTION SOLUTION
2.0000 mg | Freq: Once | INTRAMUSCULAR | Status: AC
Start: 2018-05-14 — End: 2018-05-14

## 2018-05-14 MED ORDER — METOPROLOL TARTRATE 5 MG/5 ML INTRAVENOUS SOLUTION
5.0000 mg | INTRAVENOUS | Status: AC
Start: 2018-05-14 — End: 2018-05-14
  Administered 2018-05-14: 06:00:00 5 mg via INTRAVENOUS
  Filled 2018-05-14: qty 5

## 2018-05-14 MED ORDER — METOPROLOL TARTRATE 5 MG/5 ML INTRAVENOUS SOLUTION
5.0000 mg | Freq: Once | INTRAVENOUS | Status: AC
Start: 2018-05-14 — End: 2018-05-14

## 2018-05-14 MED ADMIN — lactated Ringers intravenous solution: INTRAVENOUS | @ 07:00:00

## 2018-05-14 MED ADMIN — sodium chloride 0.9 % (flush) injection syringe: @ 13:00:00

## 2018-05-14 MED ADMIN — propranoloL 20 mg tablet: GASTROSTOMY | @ 08:00:00

## 2018-05-14 MED ADMIN — gabapentin 250 mg/5 mL oral solution: GASTROSTOMY | @ 15:00:00

## 2018-05-14 MED ADMIN — electrolyte-A intravenous solution: INTRAVENOUS | @ 15:00:00

## 2018-05-14 MED ADMIN — oxyCODONE 5 mg capsule: GASTROSTOMY | @ 15:00:00

## 2018-05-14 MED ADMIN — pneumococcal 23 polyvalent vaccine 25 mcg/0.5 mL injection solution: GASTROSTOMY | @ 21:00:00

## 2018-05-14 MED ADMIN — potassium chloride 20 mEq/L in dextrose 5 %-0.45 % sodium chloride IV: INTRAVENOUS | @ 05:00:00

## 2018-05-14 MED ADMIN — metoprolol tartrate 5 mg/5 mL intravenous solution: INTRAVENOUS | @ 07:00:00

## 2018-05-14 MED ADMIN — metoprolol tartrate 5 mg/5 mL intravenous solution: INTRAVENOUS | @ 06:00:00

## 2018-05-14 MED ADMIN — nystatin 100,000 unit/gram topical powder: TOPICAL | @ 21:00:00

## 2018-05-14 MED ADMIN — sodium chloride 0.9 % (flush) injection syringe: GASTROSTOMY | @ 21:00:00

## 2018-05-14 MED ADMIN — DOPAMINE PEDS INFUSION - 400 MG/10 ML (40 MG/ML) VIAL PREP: SUBCUTANEOUS | @ 21:00:00

## 2018-05-14 MED ADMIN — potassium chloride 20 mEq/L in dextrose 5 %-0.45 % sodium chloride IV: GASTROSTOMY | @ 21:00:00

## 2018-05-14 MED ADMIN — tiotropium device: INTRAVENOUS | @ 06:00:00

## 2018-05-14 MED ADMIN — potassium chloride 10 mEq/100mL in sterile water intravenous piggyback: GASTROSTOMY | @ 09:00:00

## 2018-05-14 MED ADMIN — sodium chloride 0.9 % (flush) injection syringe: GASTROSTOMY | @ 09:00:00

## 2018-05-14 MED ADMIN — sodium chloride 0.9 % (flush) injection syringe: @ 21:00:00

## 2018-05-14 NOTE — Progress Notes (Signed)
Spoke to brother (MPOA) on phone, see earlier progress notes for details. Spoke to patient's sister (as she called SICU earlier) on the phone as she had many questions regarding patient's status. I answered questions as best as possible regarding the patient's condition. Due to the patient's deteriorating condition it was recommended by staff that family should be allowed to see patient. Patient's sister will try to find transportation to [facility]. She was instructed to call with any questions.     Karma Greaser, MD

## 2018-05-14 NOTE — Progress Notes (Signed)
[facility]                                            Trauma Progress Note                 Date of Birth:  Mar 29, 1960  Date of Admission:  04/28/2018  Date of service: 05/14/2018    Antonio Gillespie, 58 y.o., male Post trauma day 16 status post Fall    Events over the last 24 hours have included:  Remains on BiPAP  Transfused one unit PRBC for hemoglobin of 6.8  No further labs or diagnostic tests    Subjective:    Remains on BiPap via tracheostomy currently. Awakens to stimuli, follows simple commands. Denies abdominal pain. Tolerating tube feeds at low rate, currently at 10.     Update: Patient became tachycardic to 150-170s with tachypnea. Patient appeared to have bouts of shaking, concerning for seizure activity. He was treated with 177m amiodarone bolus and 215mativan. PEG tube placed to gravity. Family was called and updated as to situation.     Objective   24 Hour Summary:    Filed Vitals:    05/14/18 0100 05/14/18 0200 05/14/18 0400 05/14/18 0431   BP:  102/80     Pulse: 85 86     Resp: (!) 26 (!) 30     Temp:   38 C (100.4 F)    SpO2: 97% 96%  97%     Labs:  Recent Labs     05/11/18  1852 05/12/18  0023 05/13/18  0017   WBC 8.0 7.4 5.5   HGB 7.6* 7.1* 6.8*   HCT 26.3* 24.0* 22.7*   SODIUM 139 139 140   POTASSIUM 4.1 4.5 3.9   CHLORIDE 101 102 102   BUN 10 9 7*   CREATININE 0.53* 0.52* 0.53*   ANIONGAP _0 CALCIUM 8.2* 7.8* 7.4*   MAGNESIUM 2.1 2.1 2.3   PHOSPHORUS 5.1* 4.8* 4.4       Intake/Output Summary (Last 24 hours) at 05/14/2018 0442  Last data filed at 05/14/2018 0200  Gross per 24 hour   Intake 3194 ml   Output --   Net 3194 ml     Nutrition Management: MNT PROTOCOL FOR DIETITIAN  DIET NPO - NOW  ADULT TUBE FEEDING - CONTINUOUS DRIP CONTINUOUS NO MEALS, TF ONLY; OSMOLITE 1.5; OG; Initial Rate (ml/hr): 10; Increase by: 0; Goal Rate (ml/hr): 60 Last Bowel Movement: 05/14/18  No results for input(s): ALBUMIN, PREALBUMIN in the last 72 hours.  Current Medications:    Current  Facility-Administered Medications:   .  acetaminophen (TYLENOL) 160 mg per 5 mL liquid - grape flavored, 650 mg, Gastric (NG, OG, PEG, GT), Q4H PRN, BoVerlon AuMD, 650 mg at 05/13/18 2038  .  aspirin chewable tablet 81 mg, 81 mg, Gastric (NG, OG, PEG, GT), Daily, BoVerlon AuMD, 81 mg at 05/13/18 0831  .  chlorhexidine gluconate (PERIDEX) 0.12% mouthwash, 15 mL, Swish & SpDelsa Grana2x/day, BoVerlon AuMD, 15 mL at 05/13/18 2038  .  electrolyte-A (PLASMALYTE-A) premix infusion, , Intravenous, Continuous, BoVerlon AuMD, Last Rate: 100 mL/hr at 05/14/18 0434  .  enoxaparin PF (LOVENOX) 60 mg/0.6 mL SubQ injection, 0.5 mg/kg, Subcutaneous, Q12H, BoVerlon AuMD, 50 mg at 05/13/18 2039  .  famotidine (PEPCID) tablet, 20 mg,  Gastric (NG, OG, PEG, GT), 2x/day, Verlon Au, MD, 20 mg at 05/13/18 2039  .  gabapentin (NEURONTIN) 358m per 678moral liquid, 400 mg, Gastric (NG, OG, PEG, GT), 3x/day, BoVerlon AuMD, 400 mg at 05/13/18 2038  .  haloperidol (HALDOL) 5 mg/mL injection, 5 mg, Intravenous, Q6H PRN, BoVerlon AuMD, 5 mg at 05/07/18 0135  .  ipratropium-albuterol 0.5 mg-3 mg(2.5 mg base)/3 mL Solution for Nebulization, 3 mL, Nebulization, 4x/day PRN, BoVerlon AuMD, 3 mL at 05/13/18 0407  .  lanolin-oxyquin-pet, hydrophil (BAG BALM) topical ointment, , Apply Topically, 2x/day PRN, BoVerlon AuMD  .  melatonin tablet, 6 mg, Gastric (NG, OG, PEG, GT), NIGHTLY, BoVerlon AuMD, 6 mg at 05/13/18 2038  .  INSERT & MAINTAIN PERIPHERAL IV ACCESS, , , UNTIL DISCONTINUED **AND** DRESSING CHANGE [place]; DRY STERILE, , , PRN **AND** NS flush syringe, 2 mL, Intracatheter, Q8HRS, 2 mL at 05/13/18 2041 **AND** NS flush syringe, 2-6 mL, Intracatheter, Q1 MIN PRN, BoVerlon AuMD  .  nutrition protein supplement 15 g per 30 mL packet, 2 Packet, Gastric (NG, OG, PEG, GT), Daily, BoVerlon AuMD, 30 g at 05/13/18 [id number].  nystatin (NYSTOP) 100,000 units/g topical powder, , Apply Topically,  2x/day, BoVerlon AuMD  .  oxyCODONE (ROXICODONE) immediate release tablet, 2.5 mg, Gastric (NG, OG, PEG, GT), Q4H PRN, BoVerlon AuMD, 2.5 mg at 05/13/18 0310  .  polyethylene glycol (MIRALAX) oral packet, 17 g, Gastric (NG, OG, PEG, GT), Daily, BoVerlon AuMD, 17 g at 05/13/18 1526  .  prenatal vitamin-iron-folic acid tablet, 1 Tab, Gastric (NG, OG, PEG, GT), Daily, BoVerlon AuMD, 1 Tab at 05/13/18 0900  .  propranolol (INDERAL) tablet, 20 mg, Gastric (NG, OG, PEG, GT), 3x/day, Thimm, Terra, MD, 20 mg at 05/13/18 2043  .  scopolamine 1 mg over 3 days transdermal patch, 1 Patch, Transdermal, Q72H, BaMarquette OldMD, 1 Patch at 05/12/18 0024  .  senna concentrate (SENNA) 52820mer 35m19mal liquid, 5 mL, Gastric (NG, OG, PEG, GT), 2x/day, BootVerlon Au, 176 mg at 05/13/18 2038  .  SSIP insulin R human (HUMULIN R) 100 units/mL injection, 0-12 Units, Subcutaneous, Q4H PRN, 2 Units at 05/11/18 0628 **AND** POCT WHOLE BLOOD GLUCOSE, , , Q6H, BootVerlon Au    Today's Physical Exam:  GEN:   NAD and Appears chronically Ill  NECK:   Trach in place, on BiPap  PULM:   Breath sounds coarse, on BiPap  CV:   Regular rate and rhythm  ABD:   Distended, PEG in place. Large left inguinal hernia, reducible, no erythema of overlying skin.  NEURO:  Awakens to stimuli, less interactive than previously  Integumentary:  Pink, warm, and dry. Scattered dry wounds over lower extremities and left upper lip    Assessment/ Plan:   Active Hospital Problems   (*Primary Problem)    Diagnosis   . *Fall     "found down" and taken to outside hospital     . COPD (chronic obstructive pulmonary disease) (CMS HCC)     Chronic   . Thrombocytopenia Unspecified   . Hypokalemia   . Alcohol abuse   . Troponin I above reference range   . Acute respiratory failure (CMS HCC)   . Nasal fracture     Acute vs chronic     . Abrasions of multiple sites   . Scalp hematoma   . Humeral fracture     nonop per ortho     .  UTI (urinary tract  infection)     At outside hospital. Repeat UA pending     . Left inguinal hernia     S/p reduction     . Anasarca     DVT prophylaxis:  Enoxaparin and SCDs/ Venodynes/Impulse boots  Anticoagulants (last 24 hours)     Date/Time Action Medication Dose    05/10/18 2106 Given    enoxaparin PF (LOVENOX) 60 mg/0.6 mL SubQ injection 50 mg    05/10/18 0826 Given    enoxaparin PF (LOVENOX) 60 mg/0.6 mL SubQ injection 50 mg        Nutrition: MNT PROTOCOL FOR DIETITIAN  DIET NPO - NOW  ADULT TUBE FEEDING - CONTINUOUS DRIP CONTINUOUS NO MEALS, TF ONLY; OSMOLITE 1.5; OG; Initial Rate (ml/hr): 10; Increase by: 0; Goal Rate (ml/hr): 60 diet, Last Bowel Movement: 05/14/18  Activity: OOB to chair   RUE nonweight bearing  Pain:   Analgesics (last 24 hours)     Date/Time Action Medication Dose    05/11/18 0511 Given    acetaminophen (TYLENOL) 160 mg per 5 mL liquid - grape flavored 650 mg    05/11/18 0044 Given    acetaminophen (TYLENOL) 160 mg per 5 mL liquid - grape flavored 650 mg        PT Recommendations: skilled nursing facility    Pneumonia   - Augmentin - until 3/28   - BAL 3/26 - PMNs, no organisms    R Humerus Fracture   - Ortho Consult   - Non-op and NWB    KUB with air throughout the colon, mild distention   - Advance tube feeds to goal as tolerated   - Continue bowel regimen   - Keep HOB at 30    Supportive Care consult   - Patient currently no CPR, no intubation, no lab studies/diagnostic tests   - 3/27 - Discussed plan to continue to wean off BiPAP over the weekend with possible transition to [place] on Monday if unable to wean     Plan:    Family updated with current status of patient with plan to continue current care without escalation. Antonio Gillespie, MPOA, requested to speak with his sister prior to making a further decision regarding CMO. He agreed with no further escalation at this time in the hopes of making his brother comfortable. I advised him that he would be able to visit in the case that Antonio Gillespie is made CMO.      Jari Pigg, MD  05/14/2018, 04:42    Trauma/Surgical Critical Care/Acute Care Surgery Staff  Late entry for 05/14/18.  I saw and examined the patient.  I reviewed the Resident note .  I agree with their findings and plan of care as documented in their note.  Any exceptions/additions are edited/noted.  Patient continues to decline, becoming less responsive.  Evidence of multi-system organ failure.  Engaging family to update them as to very guarded prognosis for survival of this hospitalization.  Continue ongoing treatment within limitations established by family.  Electronically Signed by:    Susa Loffler, DO, FACS  Associate Professor of Surgery  Trauma, Surgical Critical Care, and Acute Care Surgery  05/16/2018 22:00

## 2018-05-14 NOTE — Progress Notes (Signed)
[facility]                                               SICU PROGRESS NOTE    Antonio, Gillespie  Date of Admission:  04/28/2018  Date of Service: 05/14/2018  Date of Birth:  Aug 12, 1960    Primary Attending:  [name]  Primary Service:  Trauma Blue     LOS: 16 days      Subjective:    This morning, patient became significantly tachycardic 160s-170s. IV metoprolol 5 mg x 3 was administered with only transient response. Patient also exhibited shaking movements which were suscpicious of seizure like activity and 27m IV lorazepam was administered. Tachycardia persisted so ECG was obtained which demonstrated SVT. The patient's brother (MPOA) was contacted and initially stated that laboratory studies and diagnostic tests were ok to administer. Laboratory studies were obtained. After further discussion with MPOA patient is to be no escalation of care with no additional laboratory studies and routine diagnostic tests0.    Vital Signs:  Temp (24hrs) Max:39.1 C (1017.4F)      Systolic (294WHQ, APRF:163, Min:98 , MWGY:659    Diastolic (293TTS, AVXB:93 Min:60, Max:96    Temp  Avg: 38.2 C (100.8 F)  Min: 37.1 C (98.8 F)  Max: 39.1 C (102.4 F)  MAP (Non-Invasive)  Avg: 87.9 mmHG  Min: 74 mmHG  Max: 105 mmHG  Pulse  Avg: 93.6  Min: 73  Max: 177  Resp  Avg: 25.1  Min: 21  Max: 30  SpO2  Avg: 95.9 %  Min: 91 %  Max: 100 %  Pain Score (Numeric, Faces): Other    Meds  No current outpatient medications on file.     acetaminophen (TYLENOL) 160 mg per 5 mL liquid - grape flavored, 650 mg, Gastric (NG, OG, PEG, GT), Q4H PRN  aspirin chewable tablet 81 mg, 81 mg, Gastric (NG, OG, PEG, GT), Daily  chlorhexidine gluconate (PERIDEX) 0.12% mouthwash, 15 mL, Swish & Spit, 2x/day  electrolyte-A (PLASMALYTE-A) bolus infusion 500 mL, 500 mL, Intravenous, Once  electrolyte-A (PLASMALYTE-A) premix infusion, , Intravenous, Continuous  enoxaparin PF (LOVENOX) 60 mg/0.6 mL SubQ injection, 0.5 mg/kg, Subcutaneous,  Q12H  famotidine (PEPCID) tablet, 20 mg, Gastric (NG, OG, PEG, GT), 2x/day  gabapentin (NEURONTIN) 3052mper 3m57mral liquid, 400 mg, Gastric (NG, OG, PEG, GT), 3x/day  haloperidol (HALDOL) 5 mg/mL injection, 5 mg, Intravenous, Q6H PRN  ipratropium-albuterol 0.5 mg-3 mg(2.5 mg base)/3 mL Solution for Nebulization, 3 mL, Nebulization, 4x/day PRN  lanolin-oxyquin-pet, hydrophil (BAG BALM) topical ointment, , Apply Topically, 2x/day PRN  LORazepam (ATIVAN) 2 mg/mL injection ---Cabinet Override, , ,   melatonin tablet, 6 mg, Gastric (NG, OG, PEG, GT), NIGHTLY  metoprolol (LOPRESSOR) 5 mg/5 mL injection ---Cabinet Override, , ,   metoprolol tartrate (LOPRESSOR) tablet, 12.5 mg, Gastric (NG, OG, PEG, GT), 2x/day  NS flush syringe, 2 mL, Intracatheter, Q8HRS    And  NS flush syringe, 2-6 mL, Intracatheter, Q1 MIN PRN  nutrition protein supplement 15 g per 30 mL packet, 2 Packet, Gastric (NG, OG, PEG, GT), Daily  nystatin (NYSTOP) 100,000 units/g topical powder, , Apply Topically, 2x/day  oxyCODONE (ROXICODONE) immediate release tablet, 2.5 mg, Gastric (NG, OG, PEG, GT), Q4H PRN  polyethylene glycol (MIRALAX) oral packet, 17 g, Gastric (NG, OG, PEG, GT), Daily  prenatal vitamin-iron-folic acid  tablet, 1 Tab, Gastric (NG, OG, PEG, GT), Daily  propranolol (INDERAL) tablet, 20 mg, Gastric (NG, OG, PEG, GT), 3x/day  scopolamine 1 mg over 3 days transdermal patch, 1 Patch, Transdermal, Q72H  senna concentrate (SENNA) 577m per 142moral liquid, 5 mL, Gastric (NG, OG, PEG, GT), 2x/day  SSIP insulin R human (HUMULIN R) 100 units/mL injection, 0-12 Units, Subcutaneous, Q4H PRN        Physical Exam:   GEZYS:[id number], frail male appears older than stated age, agitated, awake but not following  HEENT:Pupils reactive bilaterally large posterior scalp hematoma  NECK:Trachea midline, tracheostomy in place  PULM:Diminished breath sounds bilaterally, course, Tracheostomy in place   CARDIAC:Afib   CHEST::No abrasions or  contusions  ABATF:TDDUKGUoft, and nondistended  PELVIS:No evidence of injury  GU/RECTAL:large scrotal edema resolving   BACK:No evidence of injury and no step-off  MSRK:YHCWCBoted on bilateral knees and feet  NEURO:Intubated, following commands  GLASGOW COMA SCALE:Eye opening:4 spontaneous, Verbal response:1Trachostomy , Best motor response:4  VASCULAR:All pulses palpable and equal bilaterally  INTEGUMENTARY:Pink, warm, and dry andanasarca    Labs:  I have reviewed all lab results.  Hgb 8.7  WBC 11.2        Assessment/ Plan:  Active Hospital Problems    Diagnosis   . Primary Problem: Fall   . COPD (chronic obstructive pulmonary disease) (CMS HCC)   . Thrombocytopenia Unspecified   . Hypokalemia   . Alcohol abuse   . Troponin I above reference range   . Acute respiratory failure (CMS HCC)   . Nasal fracture   . Abrasions of multiple sites   . Scalp hematoma   . Humeral fracture   . UTI (urinary tract infection)   . Left inguinal hernia   . Anasarca       Antonio HAVERSTOCKs a [age].o. male who is 11 Days Post-Op S/P Procedure(s) (LRB):  TRACHEOSTOMY (N/A)  GASTROSCOPY WITH PLACEMENT PEG TUBE (N/A)      NEURO:  GCS: E4=Spontaneous (Opens Eyes on Own) M4=Withdraws To Pain V1=None (Makes No Noise or Intubated)  Sedation/analgesia:    Gabapentin 400 TID    Tylenol PRN   Haldol PRN - Not needed multiple days   Oxycodone - 2.5 Q4H PRN   Scopolamine patch added for secretions  Melatonin  Prenatal vitamin    CARDIOVASCULAR:  Systolic (2476EGB AvTDV:761 Min:98 , MaYWV:371   Diastolic (2406YIR AvSWN:46Min:60, Max:96     ART-Line  MAP: 85 mmHg   Troponins: No results found for: CKMB    Episode of tachycardia treated with metoprolol then amiodarone 15021molus  Meds: aspirin, propanolol 20 mg      PULMONARY:  Airway Ventilator Settings   Tracheostomy 8 Cuffed (Active)   Trach Status Secured 05/08/2018  3:37 AM   Site Condition Intact 05/07/2018  8:00 PM   [place] Cleaned 05/07/2018  8:00 PM   [place] Bed[place]? Yes 05/07/2018  8:00 PM   Bilateral General Breath Sounds Coarse;Expiratory Wheezes 05/07/2018  8:00 PM    Ventilator Settings:      SpO2  Avg: 95.9 %  Min: 91 %  Max: 100 %  Blood Gas:  Recent Labs     05/14/18  0711   FI02 30   PH 7.44   PCO2 35.0   PO2 194.0*   BICARBONATE 25.1   BASEEXCESS 0.1     Nebs: albuterol TID, Ipratropium-albuterol,   BAL 3/21--> 30,000 S. Aureus, <100  GNR  BiPAP settings overnight increased to 20/10 Fio2 50%    Bronchoscopy:   Copious amount of secretions  BAL collected   No organisms seen    GI:  MNT PROTOCOL FOR DIETITIAN  DIET NPO - NOW  ADULT TUBE FEEDING - CONTINUOUS DRIP CONTINUOUS NO MEALS, TF ONLY; OSMOLITE 1.5; OG; Initial Rate (ml/hr): 10; Increase by: 0; Goal Rate (ml/hr): 60   No results for input(s): PREALBUMIN, BILIRUBIN, ALBUMIN, AMYLASE, LIPASE in the last 72 hours.    Invalid input(s): PHOSPHATASE, TRANSAMINASE  Last BM: Last Bowel Movement: 05/14/18  Proph: Senokot, Pepcid, Miralax    Continue tube feeds    RENAL/GU:  Recent Labs     05/12/18  0023 05/13/18  0017 05/14/18  0652 05/14/18  0711   SODIUM 139 140 139  --    POTASSIUM 4.5 3.9 3.7  --    CHLORIDE 102 102 101  --    BICARBONATE  --   --   --  25.1   BUN 9 7* 6*  --    CREATININE 0.52* 0.53* 0.70  --    ANIONGAP _0 --    CALCIUM 7.8* 7.4* 8.1*  --    MAGNESIUM 2.1 2.3 2.2  --    PHOSPHORUS 4.8* 4.4 5.0*  --        I/O last 24 hours:      Intake/Output Summary (Last 24 hours) at 05/14/2018 0732  Last data filed at 05/14/2018 0500  Gross per 24 hour   Intake 3194 ml   Output --   Net 3194 ml     I/O current shift:  No intake/output data recorded.  No foley  UA does not show evidence of infection   No diuresis today      HEME:  Recent Labs     05/12/18  0023 05/13/18  0017 05/14/18  0652   HGB 7.1* 6.8* 8.7*   HCT 24.0* 22.7* 29.6*   PLTCNT 467* 417* 591*     Transfusions:   Proph: SCD's, Lovenox     ID:  Temp (24hrs) Max:39.1 C (102.4 F)    Recent Labs     05/12/18  0023 05/13/18  0017  05/14/18  0652   WBC 7.4 5.5 11.2*   PMNS  --   --  69           Cultures:  ABX: Augmentin end date 3/28  No ABX  Pro-cal normal    New blood cultures drawn   3/23 - no growth  G+C negative    ENDO:  SSI    MSK:  No skin breakdown    OTHER:  Lines: peripheral IV x2, foley, gastrostomy tube  Activity: Bedrest, HOB 30deg  PT/OT:  SNF  MNT:  ordered    PLAN:  Continue BIPAP and trach collar  No escalation of care, no labs, no routine diagnostic tests  Family members notified patient condition worsening,   Will re assess with supportive care tomorrow options for hospice, if applicable      Dimple Nanas, MD      I saw and examined the patient.  I reviewed the resident's note.  I agree with the findings and plan of care as documented in the resident's note.  Any exceptions/additions are edited/noted.    SP fall with humerus fx, acute resp failure requiring intubation and tracheostomy, inanition requiring peg  Was on floor, aspirated requiring transfer to ICU and ventilator,     Will  maintain high PSV today. Aggressive pulm toilet and suctioning, nebs to clear secrections. On Augmentin, followup BAL and adjust abx accordingly, no signs of infection.    CXR stable     Resume tube feeds    Hyponatremia 139 improved  Holding diuresis.    Family discussion with paliative care    CMO, no escalation,     Critical Care Attestation    I was present at the bedside of this critically ill patient for 30 minutes exclusive of procedures.  This patient suffers from failure or dysfunction of Neurologic/Sensory, Pulmonary, GI/Hepatopancreaticobiliary, Renal and Immue/Allergic system(s).  The care of this patient was in regard to managing (a) conditions(s) that has a high probability of sudden, clinically significant, or life-threatening deterioration and required a high degree of Attending Physician attention and direct involvement to intervene urgently. Data review and care planning was performed in direct proximity of the  patient, examination was obviously performed in direct contact with the patient. All of this time was exclusive of procedure which will be documented elsewhere in the chart.    My critical care time is independent and unique to other providers (no other providers saw patient for purposes of sicu evaluation)  My critical care time involved full attention to the patients' condition and included:    Review of nursing notes and/or old charts  Review of medications, allergies, and vital signs  Documentation time  Consultant collaboration on findings and treatment options  Care, transfer of care, and discharge plans  Ordering, interpreting, and reviewing diagnostic studies/tab tests  Obtaining necessary history from family, EMS, nursing home staff and/or treating physicians    My critical care time did not include time spent teaching resident physician(s) or other services of resident physicians, or performing other reported procedures.  Total Critical Care Time: 30 minutes    Herschell Dimes, MD

## 2018-05-14 NOTE — Respiratory Therapy (Signed)
Patient rested on the BIPAP settings 16/8 and 30%. BBS coarse and diminished with scattered inspiratory wheezes. Continues to receive specialty bed for pulmonary toileting. Suctioning for moderate amounts of thick dark yellow secretions. RT will continue to monitor and wean as tolerated attempting ATC when and if patient tolerates.

## 2018-05-14 NOTE — Care Plan (Signed)
Patient appears comfortable on invasive BiPAP with a IPAP level of 16 and an EPAP level of 8 and 30% FIO2.  Patient continues to have large amounts of yellow/green secretions around trach.  Plan is to leave the patient on Invasive BiPAP continuous, and not to wean the settings @ this time per Dr Francesco Sor.  Patient to possibly move upstairs tomorrow.

## 2018-05-14 NOTE — Care Plan (Signed)
GCS 2(3)-5(6)-1. Patient more lethargic today. SVT this morning- converted with amio bolus. No escalation of care at this time. Supportive care consult to be placed tomorrow. Q 2 hour turns completed. Patient remains on BiPAP tolerating well. Pain controlled PRN medication. Brother and sister updated on plan of care. Will continue to monitor.

## 2018-05-14 NOTE — Progress Notes (Signed)
Interval Note    Patient remained tachycardic to the 150-170s and tachypneic to the 30-40's. Labs were drawn per the MPOAs wishes and revealed a slight leukocytosis to 11.2 and a lactate of 3.6. A 150mg  bolus of amiodarone was given and within several minutes his heart rate returned to the 90's. He remained tachypneic at that time. Michoel Hohimer was again contacted and the situation was discussed. Advised Mr. Borre that additional interventions would likely be more invasive and add little to Erasmo' overall quality of life. Mr. Ricchio was in agreement to continue current care without any escalation of care so that he could contact his sister.     I advised Mr. Dirico that if the decision to go CMO was made he would be able to visit his brother. Will continue to update family.    Gerhard Munch, MD  05/14/2018, 07:40    Trauma/Surgical Critical Care/Acute Care Surgery Staff  I saw and examined the patient.  I reviewed the Resident note.  I agree with their findings and plan of care as documented in their note.  Any exceptions/additions are edited/noted.  Patient with recurrent episodes of narrow-complex tachycardia, some responsiveness to b-blocker earlier this am.  Spontaneously converted and then recurred.  EKG seems to show absence of p-waves when run at 50 cm/s.  Becomes hypotensive with these episodes.     Febrile last evening, remains unable to wean on ventilator.  Cultures from 3/26 no growth.  Recently completed course of augmenting.  Overall prognosis for survival is grim with multi-system organ failure and failure to thrive despite 2 weeks of aggressive care in an ICU.  Remains DNR.  Encourage family to limit further interventions due to anticipated futility.    Electronically Signed by:    Harden Mo, DO, FACS  Associate Professor of Surgery  Trauma, Surgical Critical Care, and Acute Care Surgery  05/14/2018 07:44

## 2018-05-14 NOTE — Nurses Notes (Addendum)
0530: Patient tachycardic in the 170s. SICU called and MD Gerarda Gunther to bedside. Orders placed for  metoprolol. Will administer and continue to monitor.     0600: Patient in repeat tachycardia in the 160 - 170s. SICU notified and MD Sprando to bedside. Orders placed for additional metoprolol. Will administer and continue to monitor.

## 2018-05-14 NOTE — Progress Notes (Signed)
Patient became tachycardic in 160s-170s, transiently responded to 5 mg IV metoprolol, was given additional 10 mg IV metoprolol. Staff notified and decision was made to give 150mg  bolus IV amiodarone. Patient's brother (MPOA) was contacted and informed of situation and asked to clarify goals as patient currently has treatment limitiations of DNR/DNI, no lab draws, or routine diagnostic tests. In light of current situation it was explained that in order to effectively attempt to treat the patient and not cause further harm we would need to perform laboratory draws and diagnostic tests. Patient's brother was agreeable to this but still would like the patient to be DNR/DNI in the event of cardiac arrest.     Karma Greaser, MD

## 2018-05-15 DIAGNOSIS — R4182 Altered mental status, unspecified: Secondary | ICD-10-CM | POA: Diagnosis present

## 2018-05-15 DIAGNOSIS — D649 Anemia, unspecified: Secondary | ICD-10-CM | POA: Diagnosis present

## 2018-05-15 DIAGNOSIS — I4891 Unspecified atrial fibrillation: Secondary | ICD-10-CM | POA: Diagnosis present

## 2018-05-15 DIAGNOSIS — Z9911 Dependence on respirator [ventilator] status: Secondary | ICD-10-CM | POA: Diagnosis present

## 2018-05-15 LAB — BASIC METABOLIC PANEL
ANION GAP: 7 mmol/L (ref 4–13)
BUN/CREA RATIO: 11 (ref 6–22)
BUN: 6 mg/dL — ABNORMAL LOW (ref 8–25)
CALCIUM: 7.6 mg/dL — ABNORMAL LOW (ref 8.5–10.2)
CHLORIDE: 103 mmol/L (ref 96–111)
CO2 TOTAL: 28 mmol/L (ref 22–32)
CREATININE: 0.54 mg/dL — ABNORMAL LOW (ref 0.62–1.27)
ESTIMATED GFR: >60 mL/min/1.73mˆ2 (ref >60)
GLUCOSE: 98 mg/dL (ref 65–139)
POTASSIUM: 3.2 mmol/L — ABNORMAL LOW (ref 3.5–5.1)
SODIUM: 138 mmol/L (ref 136–145)

## 2018-05-15 LAB — CBC WITH DIFF
BASOPHIL #: <0.1 x10ˆ3/uL (ref <=0.20)
BASOPHIL %: 0 %
EOSINOPHIL #: 0.22 10*3/uL (ref <=0.50)
EOSINOPHIL %: 3 %
HCT: 30.4 % — ABNORMAL LOW (ref 38.9–52.0)
HGB: 8.7 g/dL — ABNORMAL LOW (ref 13.4–17.5)
IMMATURE GRANULOCYTE #: <0.1 x10ˆ3/uL (ref <0.10)
IMMATURE GRANULOCYTE %: 1 % (ref 0–1)
LYMPHOCYTE #: 1.25 x10ˆ3/uL (ref 1.00–4.80)
LYMPHOCYTE %: 15 %
MCH: 26.4 pg (ref 26.0–32.0)
MCHC: 28.6 g/dL — ABNORMAL LOW (ref 31.0–35.5)
MCV: 92.1 fL (ref 78.0–100.0)
MONOCYTE #: 0.76 x10ˆ3/uL (ref 0.20–1.10)
MONOCYTE %: 9 %
MPV: 11.4 fL (ref 8.7–12.5)
NEUTROPHIL #: 6.06 10*3/uL (ref 1.50–7.70)
NEUTROPHIL %: 72 %
PLATELETS: 489 x10ˆ3/uL — ABNORMAL HIGH (ref 150–400)
RBC: 3.3 x10ˆ6/uL — ABNORMAL LOW (ref 4.50–6.10)
RDW-CV: 18.6 % — ABNORMAL HIGH (ref 11.5–15.5)
WBC: 8.4 x10ˆ3/uL (ref 3.7–11.0)

## 2018-05-15 LAB — VENOUS BLOOD GAS
%FIO2 (VENOUS): 30 %
%FIO2 (VENOUS): 50 %
BASE DEFICIT: 0.6 mmol/L (ref ?–3.0)
BASE DEFICIT: 2.8 mmol/L (ref ?–3.0)
BICARBONATE (VENOUS): 24 mmol/L (ref 22.0–26.0)
BICARBONATE (VENOUS): 24 mmol/L (ref 22.0–26.0)
BICARBONATE (VENOUS): 22.3 mmol/L (ref 22.0–26.0)
PCO2 (VENOUS): 62 mmHg (ref 41.00–51.00)
PCO2 (VENOUS): 60 mmHg — ABNORMAL HIGH (ref 41.00–51.00)
PH (VENOUS): 7.26 — ABNORMAL LOW (ref 7.31–7.41)
PH (VENOUS): 7.24 — ABNORMAL LOW (ref 7.31–7.41)
PO2 (VENOUS): 52 mmHg — ABNORMAL HIGH (ref 35.0–50.0)
PO2 (VENOUS): 54 mm/Hg — ABNORMAL HIGH (ref 35.0–50.0)
PO2 (VENOUS): 54 mmHg — ABNORMAL HIGH (ref 35.0–50.0)

## 2018-05-15 LAB — POC BLOOD GLUCOSE (RESULTS): GLUCOSE, POC: 102 mg/dL (ref 70–105)

## 2018-05-15 LAB — PHOSPHORUS: PHOSPHORUS: 4.9 mg/dL — ABNORMAL HIGH (ref 2.4–4.7)

## 2018-05-15 LAB — MAGNESIUM: MAGNESIUM: 2.2 mg/dL (ref 1.6–2.6)

## 2018-05-15 MED ORDER — POTASSIUM CHLORIDE 10 MEQ/100ML IN STERILE WATER INTRAVENOUS PIGGYBACK
10.0000 meq | INJECTION | INTRAVENOUS | Status: AC
Start: 2018-05-15 — End: 2018-05-15
  Administered 2018-05-15: 10 meq via INTRAVENOUS
  Administered 2018-05-15: 0 meq via INTRAVENOUS
  Administered 2018-05-15: 10 meq via INTRAVENOUS
  Administered 2018-05-15: 0 meq via INTRAVENOUS
  Administered 2018-05-15 (×2): 10 meq via INTRAVENOUS
  Administered 2018-05-15 (×2): 0 meq via INTRAVENOUS
  Filled 2018-05-15 (×4): qty 100

## 2018-05-15 MED ORDER — ELECTROLYTE-A INTRAVENOUS SOLUTION
INTRAVENOUS | Status: DC
Start: 2018-05-15 — End: 2018-05-17
  Administered 2018-05-17: 07:00:00 via INTRAVENOUS

## 2018-05-15 MED ORDER — GLYCOPYRROLATE 100 MCG/ML ORAL LIQUID PEDS DILUTION
200.0000 ug | Freq: Once | ORAL | Status: AC
Start: 2018-05-15 — End: 2018-05-15
  Administered 2018-05-15: 200 ug via GASTROSTOMY
  Filled 2018-05-15: qty 2

## 2018-05-15 MED ORDER — LACTOBACILLUS RHAMNOSUS GG 15 BILLION CELL SPRINKLE CAPSULE
1.0000 | ORAL_CAPSULE | Freq: Two times a day (BID) | ORAL | Status: DC
Start: 2018-05-15 — End: 2018-05-19
  Administered 2018-05-15 – 2018-05-19 (×8): 1 via GASTROSTOMY
  Filled 2018-05-15 (×10): qty 1

## 2018-05-15 MED ADMIN — sodium chloride 0.9 % (flush) injection syringe: @ 06:00:00

## 2018-05-15 MED ADMIN — sodium chloride 0.9 % (flush) injection syringe: @ 21:00:00

## 2018-05-15 MED ADMIN — potassium chloride 10 mEq/100mL in sterile water intravenous piggyback: INTRAVENOUS | @ 19:00:00

## 2018-05-15 MED ADMIN — nystatin 100,000 unit/gram topical powder: TOPICAL | @ 09:00:00

## 2018-05-15 MED ADMIN — propranoloL 20 mg tablet: @ 08:00:00

## 2018-05-15 MED ADMIN — scopolamine 1 mg over 3 days transdermal patch: TRANSDERMAL | @ 02:00:00

## 2018-05-15 MED ADMIN — sodium chloride 0.9 % (flush) injection syringe: INTRAVENOUS | @ 16:00:00

## 2018-05-15 MED ADMIN — gabapentin 250 mg/5 mL oral solution: GASTROSTOMY | @ 15:00:00

## 2018-05-15 MED ADMIN — potassium chloride 10 mEq/100mL in sterile water intravenous piggyback: INTRAVENOUS | @ 17:00:00

## 2018-05-15 MED ADMIN — tiotropium device: GASTROSTOMY | @ 19:00:00

## 2018-05-15 MED ADMIN — metoprolol tartrate 25 mg tablet: GASTROSTOMY | @ 21:00:00

## 2018-05-15 MED ADMIN — gabapentin 250 mg/5 mL oral solution: GASTROSTOMY | @ 21:00:00

## 2018-05-15 MED ADMIN — enoxaparin 60 mg/0.6 mL subcutaneous syringe: SUBCUTANEOUS | @ 21:00:00

## 2018-05-15 MED ADMIN — sodium chloride 0.9 % (flush) injection syringe: INTRAVENOUS | @ 21:00:00

## 2018-05-15 MED ADMIN — sodium chloride 0.9 % (flush) injection syringe: SUBCUTANEOUS | @ 10:00:00

## 2018-05-15 MED ADMIN — ergocalciferol (vitamin D2) 1,250 mcg (50,000 unit) capsule: GASTROSTOMY | @ 09:00:00

## 2018-05-15 MED ADMIN — lidocaine HCL 10 mg/mL (1 %) injection solution: GASTROSTOMY | @ 09:00:00

## 2018-05-15 MED ADMIN — HYDROmorphone (PF) 1 mg/mL injection solution: TOPICAL | @ 21:00:00

## 2018-05-15 MED ADMIN — isosorbide mononitrate ER 30 mg tablet,extended release 24 hr: GASTROSTOMY | @ 09:00:00

## 2018-05-15 MED ADMIN — ofloxacin 0.3 % ear drops: @ 15:00:00

## 2018-05-15 MED ADMIN — guaiFENesin ER 600 mg tablet, extended release 12 hr: TRANSDERMAL

## 2018-05-15 MED ADMIN — sodium chloride 0.9 % (flush) injection syringe: GASTROSTOMY | @ 10:00:00

## 2018-05-15 NOTE — Progress Notes (Signed)
[facility]                                            Trauma Progress Note                 Date of Birth:  03-Feb-1961  Date of Admission:  04/28/2018  Date of service: 05/15/2018    Antonio Gillespie, 58 y.o., male Post trauma day 17 status post Fall    Events over the last 24 hours have included:  Remains on BiPAP  No further labs or diagnostic tests    Subjective:    Patient more responsive today though not consistently following commands. On BiPaP.       Objective   24 Hour Summary:    Filed Vitals:    05/15/18 1300 05/15/18 1400 05/15/18 1500 05/15/18 1600   BP: (!) 148/90 136/81 121/90 (!) 147/91   Pulse: 86 87 85 86   Resp: (!) 35 (!) 24 (!) 34 (!) 30   Temp:    36 C (96.8 F)   SpO2: 98% (!) 84% 91% 97%     Labs:  Recent Labs     05/13/18  0017 05/14/18  0652 05/14/18  0711 05/15/18  1300 05/15/18  1301   WBC 5.5 11.2*  --  8.4  --    HGB 6.8* 8.7*  --  8.7*  --    HCT 22.7* 29.6*  --  30.4*  --    SODIUM 140 139  --  138  --    POTASSIUM 3.9 3.7  --  3.2*  --    CHLORIDE 102 101  --  103  --    BICARBONATE  --   --  25.1  --  24.0   BUN 7* 6*  --  6*  --    CREATININE 0.53* 0.70  --  0.54*  --    ANIONGAP 10 11  --  7  --    CALCIUM 7.4* 8.1*  --  7.6*  --    MAGNESIUM 2.3 2.2  --  2.2  --    PHOSPHORUS 4.4 5.0*  --  4.9*  --        Intake/Output Summary (Last 24 hours) at 05/15/2018 1641  Last data filed at 05/15/2018 1600  Gross per 24 hour   Intake 2645 ml   Output --   Net 2645 ml     Nutrition Management: MNT PROTOCOL FOR DIETITIAN  DIET NPO - NOW  ADULT TUBE FEEDING - CONTINUOUS DRIP CONTINUOUS NO MEALS, TF ONLY; OSMOLITE 1.5; OG; Initial Rate (ml/hr): 10; Increase by: 10cc Q4 hours to goal; Goal Rate (ml/hr): 60 Last Bowel Movement: 05/15/18  Recent Labs     05/14/18  0652   ALBUMIN 1.6*     Current Medications:    Current Facility-Administered Medications:   .  acetaminophen (TYLENOL) 160 mg per 5 mL liquid - grape flavored, 650 mg, Gastric (NG, OG, PEG, GT), Q4H PRN, Verlon Au, MD, 650 mg  at 05/14/18 1310  .  aspirin chewable tablet 81 mg, 81 mg, Gastric (NG, OG, PEG, GT), Daily, Verlon Au, MD, 81 mg at 05/15/18 6803  .  chlorhexidine gluconate (PERIDEX) 0.12% mouthwash, 15 mL, Swish & Delsa Grana, 2x/day, Verlon Au, MD, 15 mL at 05/15/18 0929  .  electrolyte-A (PLASMALYTE-A) premix infusion, , Intravenous, Continuous, Dela'O,  Marlowe Kays, MD, Last Rate: 75 mL/hr at 05/15/18 1538  .  enoxaparin PF (LOVENOX) 60 mg/0.6 mL SubQ injection, 0.5 mg/kg, Subcutaneous, Q12H, Verlon Au, MD, 50 mg at 05/15/18 0931  .  famotidine (PEPCID) tablet, 20 mg, Gastric (NG, OG, PEG, GT), 2x/day, Verlon Au, MD, 20 mg at 05/15/18 [phone]  .  gabapentin (NEURONTIN) 333m per 668moral liquid, 400 mg, Gastric (NG, OG, PEG, GT), 3x/day, BoVerlon AuMD, 400 mg at 05/15/18 1447  .  ipratropium-albuterol 0.5 mg-3 mg(2.5 mg base)/3 mL Solution for Nebulization, 3 mL, Nebulization, 4x/day PRN, BoVerlon AuMD, 3 mL at 05/13/18 0407  .  lactobacillus rhamnosus (CULTURELLE) active cultures capsule, 1 Cap, Gastric (NG, OG, PEG, GT), 2x/day-Food, BaMarquette OldMD  .  lanolin-oxyquin-pet, hydrophil (BAG BALM) topical ointment, , Apply Topically, 2x/day PRN, BoVerlon AuMD  .  melatonin tablet, 6 mg, Gastric (NG, OG, PEG, GT), NIGHTLY, BoVerlon AuMD, 6 mg at 05/14/18 2111  .  metoprolol tartrate (LOPRESSOR) tablet, 12.5 mg, Gastric (NG, OG, PEG, GT), 2x/day, BaMarquette OldMD, 12.5 mg at 05/15/18 0931  .  INSERT & MAINTAIN PERIPHERAL IV ACCESS, , , UNTIL DISCONTINUED **AND** DRESSING CHANGE Saline Lock; DRY STERILE, , , PRN **AND** NS flush syringe, 2 mL, Intracatheter, Q8HRS, 2 mL at 05/15/18 1447 **AND** NS flush syringe, 2-6 mL, Intracatheter, Q1 MIN PRN, BoVerlon AuMD  .  nutrition protein supplement 15 g per 30 mL packet, 2 Packet, Gastric (NG, OG, PEG, GT), Daily, BoVerlon AuMD, 30 g at 05/15/18 [id number].  nystatin (NYSTOP) 100,000 units/g topical powder, , Apply Topically, 2x/day, BoVerlon Au MD  .  potassium chloride 10 mEq in SW 100 mL premix infusion, 10 mEq, Intravenous, Q1H, SpDimple NanasMD, Last Rate: 100 mL/hr at 05/15/18 1625, 10 mEq at 05/15/18 1625  .  prenatal vitamin-iron-folic acid tablet, 1 Tab, Gastric (NG, OG, PEG, GT), Daily, BoVerlon AuMD, 1 Tab at 05/15/18 [phone].  scopolamine 1 mg over 3 days transdermal patch, 1 Patch, Transdermal, Q72H, BaMarquette OldMD, 1 Patch at 05/15/18 0226  .  senna concentrate (SENNA) 52892mer 53m71mal liquid, 5 mL, Gastric (NG, OG, PEG, GT), 2x/day, BootVerlon Au, Stopped at 05/14/18 0900  .  SSIP insulin R human (HUMULIN R) 100 units/mL injection, 0-12 Units, Subcutaneous, Q4H PRN, 2 Units at 05/11/18 0628 **AND** POCT WHOLE BLOOD GLUCOSE, , , Q6H, BootVerlon Au    Today's Physical Exam:  GEN:   NAD and Appears chronically Ill  NECK:   Trach in place, on BiPap  PULM:   Breath sounds coarse, on BiPap  CV:   Regular rate and rhythm  ABD:   Distended, PEG in place. Large left inguinal hernia, reducible, no erythema of overlying skin.  NEURO:  Awakens to stimuli.   Integumentary:  Pink, warm, and dry. Scattered dry wounds over lower extremities and left upper lip    Assessment/ Plan:   Active Hospital Problems   (*Primary Problem)    Diagnosis   . *Fall     "found down" and taken to outside hospital     . Altered mental status, unspecified   . Atrial fibrillation (CMS HCC)   . Failure to wean from mechanical ventilation (CMS HCC)   . Anemia   . COPD (chronic obstructive pulmonary disease) (CMS HCC)     Chronic   . Thrombocytopenia Unspecified   . Hypokalemia   . Alcohol abuse   . Acute respiratory failure (  CMS [place])   . Nasal fracture     Acute vs chronic     . Abrasions of multiple sites   . Scalp hematoma   . Humeral fracture     nonop per ortho     . Left inguinal hernia     S/p reduction     . Anasarca     DVT prophylaxis:  Enoxaparin and SCDs/ Venodynes/Impulse boots  Anticoagulants (last 24 hours)     Date/Time Action Medication  Dose    05/10/18 2106 Given    enoxaparin PF (LOVENOX) 60 mg/0.6 mL SubQ injection 50 mg    05/10/18 0826 Given    enoxaparin PF (LOVENOX) 60 mg/0.6 mL SubQ injection 50 mg        Nutrition: MNT PROTOCOL FOR DIETITIAN  DIET NPO - NOW  ADULT TUBE FEEDING - CONTINUOUS DRIP CONTINUOUS NO MEALS, TF ONLY; OSMOLITE 1.5; OG; Initial Rate (ml/hr): 10; Increase by: 10cc Q4 hours to goal; Goal Rate (ml/hr): 60 diet, Last Bowel Movement: 05/15/18  Activity: OOB to chair   RUE nonweight bearing  Pain:   Analgesics (last 24 hours)     Date/Time Action Medication Dose    05/11/18 0511 Given    acetaminophen (TYLENOL) 160 mg per 5 mL liquid - grape flavored 650 mg    05/11/18 0044 Given    acetaminophen (TYLENOL) 160 mg per 5 mL liquid - grape flavored 650 mg        PT Recommendations: skilled nursing facility    Pneumonia   - Augmentin - until 3/28   - BAL 3/26 - PMNs, no organisms    R Humerus Fracture   - Ortho Consult   - Non-op and NWB    KUB with air throughout the colon, mild distention   - Advance tube feeds to goal as tolerated   - Continue bowel regimen   - Keep HOB at 30    Supportive Care consult   - Patient currently no CPR, no intubation, no lab studies/diagnostic tests   - 3/27 - Discussed plan to continue to wean off BiPAP over the weekend with possible transition to [place] on Monday if unable to wean     Plan:  -Supportive care to touch base with family about possible hospice. Wean vent when able     Perrin Smack, MD  05/15/2018, 18:54      Late entry for 05/15/18. I saw and examined the patient.  I reviewed the resident's note.  I agree with the findings and plan of care as documented in the resident's note.  Any exceptions/additions are edited/noted.    Ann Held, MD

## 2018-05-15 NOTE — Respiratory Therapy (Signed)
Pt on BiPAP 16/8/30 maintaining

## 2018-05-15 NOTE — Progress Notes (Signed)
[facility]                                               SICU PROGRESS NOTE    Antonio Gillespie, Antonio Gillespie  Date of Admission:  04/28/2018  Date of Service: 05/15/2018  Date of Birth:  [birth year]    Primary Attending:  [name]  Primary Service:  Trauma Blue     LOS: 17 days      Subjective:    Overnight had watery diarrhea. Noted to have blood streaked secretions after changing tracheostomy.     Vital Signs:  Temp (24hrs) Max:38.8 C (157.2 F)      Systolic (62MBT), DHR:416 , Min:85 , LAG:536     Diastolic (46OEH), OZY:24, Min:57, Max:105    Temp  Avg: 37.2 C (98.9 F)  Min: 35.7 C (96.3 F)  Max: 38.8 C (101.8 F)  MAP (Non-Invasive)  Avg: 83.5 mmHG  Min: 70 mmHG  Max: 111 mmHG  Pulse  Avg: 88.5  Min: 65  Max: 172  Resp  Avg: 22.9  Min: 18  Max: 34  SpO2  Avg: 95.9 %  Min: 89 %  Max: 100 %  Pain Score (Numeric, Faces): Other    Meds  No current outpatient medications on file.     acetaminophen (TYLENOL) 160 mg per 5 mL liquid - grape flavored, 650 mg, Gastric (NG, OG, PEG, GT), Q4H PRN  aspirin chewable tablet 81 mg, 81 mg, Gastric (NG, OG, PEG, GT), Daily  chlorhexidine gluconate (PERIDEX) 0.12% mouthwash, 15 mL, Swish & Spit, 2x/day  electrolyte-A (PLASMALYTE-A) premix infusion, , Intravenous, Continuous  enoxaparin PF (LOVENOX) 60 mg/0.6 mL SubQ injection, 0.5 mg/kg, Subcutaneous, Q12H  famotidine (PEPCID) tablet, 20 mg, Gastric (NG, OG, PEG, GT), 2x/day  gabapentin (NEURONTIN) 324m per 662moral liquid, 400 mg, Gastric (NG, OG, PEG, GT), 3x/day  haloperidol (HALDOL) 5 mg/mL injection, 5 mg, Intravenous, Q6H PRN  ipratropium-albuterol 0.5 mg-3 mg(2.5 mg base)/3 mL Solution for Nebulization, 3 mL, Nebulization, 4x/day PRN  lanolin-oxyquin-pet, hydrophil (BAG BALM) topical ointment, , Apply Topically, 2x/day PRN  melatonin tablet, 6 mg, Gastric (NG, OG, PEG, GT), NIGHTLY  metoprolol (LOPRESSOR) 1 mg/mL injection, 5 mg, Intravenous, Once  metoprolol (LOPRESSOR) 1 mg/mL injection, 5 mg, Intravenous,  Once  metoprolol tartrate (LOPRESSOR) tablet, 12.5 mg, Gastric (NG, OG, PEG, GT), 2x/day  NS flush syringe, 2 mL, Intracatheter, Q8HRS    And  NS flush syringe, 2-6 mL, Intracatheter, Q1 MIN PRN  nutrition protein supplement 15 g per 30 mL packet, 2 Packet, Gastric (NG, OG, PEG, GT), Daily  nystatin (NYSTOP) 100,000 units/g topical powder, , Apply Topically, 2x/day  oxyCODONE (ROXICODONE) immediate release tablet, 2.5 mg, Gastric (NG, OG, PEG, GT), Q4H PRN  prenatal vitamin-iron-folic acid tablet, 1 Tab, Gastric (NG, OG, PEG, GT), Daily  propranolol (INDERAL) tablet, 20 mg, Gastric (NG, OG, PEG, GT), 3x/day  scopolamine 1 mg over 3 days transdermal patch, 1 Patch, Transdermal, Q72H  senna concentrate (SENNA) 52861mer 69m4mal liquid, 5 mL, Gastric (NG, OG, PEG, GT), 2x/day  SSIP insulin R human (HUMULIN R) 100 units/mL injection, 0-12 Units, Subcutaneous, Q4H PRN        Physical Exam:   GEN:MGN:[id number], frail male appears older than stated age, awake and attempting to communicate  HEENT:Pupils reactive bilaterally   NECK:Trachea midline, tracheostomy in place  PULM:Diminished breath  sounds bilaterally, course, Tracheostomy in place, small amount of blood streaked secretions in suction cannula, secretions currently not blood streaked   CARDIAC:Afib   CHEST::No abrasions or contusions  CNO:BSJGGEZ soft, and nondistended  PELVIS:No evidence of injury  GU/RECTAL:large scrotal edema resolving   BACK:No evidence of injury and no step-off  MO:QHUTML noted on bilateral knees and feet  NEURO:Intubated, following commands  GLASGOW COMA SCALE:Eye opening:4 spontaneous, Verbal response:1Trachostomy , Best motor response:6  VASCULAR:All pulses palpable and equal bilaterally  INTEGUMENTARY:Pink, warm, and dry andanasarca    Labs:  I have reviewed all lab results.  05/14/2018  Hgb 8.7  WBC 11.2        Assessment/ Plan:  Active Hospital Problems    Diagnosis   . Primary Problem: Fall   . COPD  (chronic obstructive pulmonary disease) (CMS HCC)   . Thrombocytopenia Unspecified   . Hypokalemia   . Alcohol abuse   . Troponin I above reference range   . Acute respiratory failure (CMS HCC)   . Nasal fracture   . Abrasions of multiple sites   . Scalp hematoma   . Humeral fracture   . UTI (urinary tract infection)   . Left inguinal hernia   . Anasarca       Antonio Gillespie is a 58 y.o. male who is 12 Days Post-Op S/P Procedure(s) (LRB):  TRACHEOSTOMY (N/A)  GASTROSCOPY WITH PLACEMENT PEG TUBE (N/A)      NEURO:  GCS: E4=Spontaneous (Opens Eyes on Own) M6=Normal (Follows Simple Commands) V1=None (Makes No Noise or Intubated) Tracheostomy  Sedation/analgesia:    Gabapentin 400 TID    Tylenol PRN   Haldol PRN - Not needed multiple days   Oxycodone - 2.5 Q4H PRN   Scopolamine patch for secretions  Melatonin nightly  Prenatal vitamin    CARDIOVASCULAR:  Systolic (46TKP), TWS:568 , Min:85 , LEX:517     Diastolic (00FVC), BSW:96, Min:57, Max:105     ART-Line  MAP: 85 mmHg   Troponins: No results found for: CKMB    Episode of tachycardia treated with metoprolol then amiodarone 156m bolus  Meds: aspirin, propanolol 20 mg       PULMONARY:  Airway Ventilator Settings   Tracheostomy 8 Cuffed (Active)   Trach Status Secured 05/08/2018  3:37 AM   Site Condition Intact 05/07/2018  8:00 PM   TNelsoniaSite Cleaned 05/07/2018  8:00 PM   [place] Bedside? Yes 05/07/2018  8:00 PM   Bilateral General Breath Sounds Coarse;Expiratory Wheezes 05/07/2018  8:00 PM    Ventilator Settings:      SpO2  Avg: 95.9 %  Min: 89 %  Max: 100 %  Blood Gas:  Recent Labs     05/14/18  0711   FI02 30   PH 7.44   PCO2 35.0   PO2 194.0*   BICARBONATE 25.1   BASEEXCESS 0.1     Nebs: albuterol TID, Ipratropium-albuterol,   BAL 3/21--> 30,000 S. Aureus, <100 GNR  BiPAP settings overnight increased to 20/10 Fio2 50%    Bronchoscopy:   Copious amount of secretions  BAL collected   No organisms seen    GI:  MNT PROTOCOL FOR DIETITIAN  DIET NPO - NOW  ADULT  TUBE FEEDING - CONTINUOUS DRIP CONTINUOUS NO MEALS, TF ONLY; OSMOLITE 1.5; OG; Initial Rate (ml/hr): 10; Increase by: 0; Goal Rate (ml/hr): 60   Recent Labs     05/14/18  0652   ALBUMIN 1.6*     Last BM:  Last Bowel Movement: 05/14/18  Proph: Hold bowel regimen for diarrhea, if severe consider rectal tube, famotidine    Advance tube feeds to goal    RENAL/GU:  Recent Labs     05/13/18  0017 05/14/18  0652 05/14/18  0711   SODIUM 140 139  --    POTASSIUM 3.9 3.7  --    CHLORIDE 102 101  --    BICARBONATE  --   --  25.1   BUN 7* 6*  --    CREATININE 0.53* 0.70  --    ANIONGAP 10 11  --    CALCIUM 7.4* 8.1*  --    MAGNESIUM 2.3 2.2  --    PHOSPHORUS 4.4 5.0*  --        I/O last 24 hours:      Intake/Output Summary (Last 24 hours) at 05/15/2018 9449  Last data filed at 05/14/2018 2300  Gross per 24 hour   Intake 1680 ml   Output --   Net 1680 ml     I/O current shift:  03/29 1900 - 03/30 0659  In: 440 [I.V.:400; GT:40]  Out: -   No foley          HEME:  Recent Labs     05/13/18  0017 05/14/18  0652   HGB 6.8* 8.7*   HCT 22.7* 29.6*   PLTCNT 417* 591*     Transfusions:   Proph: SCD's, Lovenox     ID:  Temp (24hrs) Max:38.8 C (101.8 F)    Recent Labs     05/13/18  0017 05/14/18  0652   WBC 5.5 11.2*   PMNS  --  69           Cultures:  ABX: Augmentin end date 3/28  No ABX  Pro-cal normal    New blood cultures drawn   3/23 - no growth  G+C negative    ENDO:  SSI    MSK:  No skin breakdown    OTHER:  Lines: peripheral IV x2, foley, gastrostomy tube  Activity: Bedrest, HOB 30deg  PT/OT:  SNF  MNT:  ordered    PLAN:  Continue BIPAP and trach collar  Will re-evaluate goals of care with supportive care and family      Dimple Nanas, MD    Trauma/Surgical Critical Care/Acute Care Surgery Staff    I saw and examined the patient. I reviewed the residents/PA note. I agree with the findings and plan of care as documented in the resident/PA's note. Any exceptions/additions are edited/noted.    Neuro: Mental status waxes and wanes.    Continue multimodal pain medication: DC oxy 10 as not requiring  Known alcoholic. Given thiamine/B12. Scheduled ativan with taper completed  Continuemultimodal pain medication  DC haldol for agitation as not requiring  Sleep: melatonin    Resp:   Respiratory Rate: 19  SpO2: 100 %  No results found for this encounter  S/p trach- on BIPAP. Cont to wean as tol. Will try 1 hour of ATC and see how tolerates   Continue aggressive pulmonary hygiene/IS   Scopolamine for secretions     CV:  SBP over last 24:  Systolic (67RFF), MBW:466 , Min:90 , Max:136    Afib with RVR- no new episodes. DC propranolol and increase metoprolol dosing.   Hemodynamically acceptable. Cont to monitor    GI:  Diet: NPO TFs: trickles- increase to goal   Add lactobacillus  Pepcid- DC with TFs @ goal  Bowel  Reg-continue  Last Bowel Movement: 05/15/18  Recent Labs     05/14/18  0652   ALBUMIN 1.6*   protein malnutrition    ENDO: For hyperglycemia management: ISS:    Renal/FEN:     Intake/Output Summary (Last 24 hours) at 05/15/2018 0748  Last data filed at 05/15/2018 0700  Gross per 24 hour   Intake 2500 ml   Output --   Net 2500 ml     Recent Labs     05/13/18  0017 05/14/18  0652 05/14/18  0711 05/15/18  1300 05/15/18  1301   SODIUM 140 139  --  138  --    POTASSIUM 3.9 3.7  --  3.2*  --    CHLORIDE 102 101  --  103  --    BICARBONATE  --   --  25.1  --  24.0   BUN 7* 6*  --  6*  --    CREATININE 0.53* 0.70  --  0.54*  --    ANIONGAP 10 11  --  7  --    CALCIUM 7.4* 8.1*  --  7.6*  --    MAGNESIUM 2.3 2.2  --  2.2  --    PHOSPHORUS 4.4 5.0*  --  4.9*  --      Uo:  adeq Monitor Uo,  IVFS: Decrease IVFs as TFs increase.    Lytes-Hypokalemia, replace K to prevent potential neuromuscular or  cardiac events, ileus, renal abnormalities or  glucose intolerance.  Foley-yes. critical I&O    Heme: Ac Blood Loss Anemia-asx.  Ensure all blood draws are pedi tubes to min volume taken.  Temperature: 35.8 C (96.4 F)  Recent Labs     05/13/18  0017  05/14/18  0652   WBC 5.5 11.2*   HGB 6.8* 8.7*   HCT 22.7* 29.6*   PLTCNT 417* 591*       ID: Temp (12hrs), Avg:36.1 C (96.9 F), Min:35.7 C (96.3 F), Max:36.6 C (97.9 F)  Afebrile    Skin/Musculoskeletal:   Extremities: humeral fx- nonop per ortho   Breakdown: scattered abrasions   continue local wound care  DVT PPX: SCDs, Lovenox/SQ Heparin  Therapies: PT/OT/Speech   Activity NWB to upper extremity with fracture   Lines:   Disposition: cont ICU  Code Status: DNR. Clarify if no escalation or CMO with brother today. Appreciate supportive care imput    Critical Care Attestation  Patient withAcute hypoxic/hyercarbic Respiratory insufficiency/Failure requiring frequent monitoring and assessment including including aggressive pulmonary hygiene.     This patient suffers from failure or dysfunction of Neurologic/Sensory, Respiratory, Cardiovascular, GI/Hepatopancreaticobiliary, Infectious/Hematologic and Musculoskeletal system(s).      I was present at the bedside of this critically ill patient exclusive of procedures.The care of this patient was in regard to managing (a) conditions(s) that has a high probability of sudden, clinically significant, or life-threatening deterioration and required a high degree of Attending Physician attention and direct involvement to intervene urgently.   Data review and care planning was performed in direct proximity of the patient, examination was obviously performed in direct contact with the patient. All of this time was exclusive of procedure which will be documented elsewhere in the chart.    My critical care time is independent and unique to other providers (no other providers saw patient for purposes of SICU evaluation)  My critical care time involved full attention to the patients' condition and included:    Review of nursing notes and/or old charts  Review of medications, allergies, and vital signs  Documentation time  Consultant collaboration on findings and treatment  options  Care, transfer of care, and discharge plans  Ordering, interpreting, and reviewing diagnostic studies/tab tests  Obtaining necessary history from family, EMS, nursing home staff and/or treating physicians    My critical care time did not include time spent teaching resident physician(s) or other services of resident physicians, or performing other reported procedures.    Total Critical Care Time: 65  minutes  Electronically Signed by:    Tawana Scale MD, MS  Assistant Professor of Surgery  Trauma, Surgical Critical Care, and [place]  05/15/2018 07:48

## 2018-05-15 NOTE — Care Management Notes (Signed)
[facility]  Care Management Note    Patient Name: Antonio Gillespie  Date of Birth: Jul 04, 1960  Sex: male  Date/Time of Admission: 04/28/2018  3:32 PM  Room/Bed: 02/A  Payor: [employer] PENDING MEDICAID / Plan: ELIGIBILITY PENDING MEDICAID / Product Type: Medicaid /    LOS: 17 days   Primary Care Providers:  Selinda Eon, MD, MD (General)    Admitting Diagnosis:  Scalp hematoma [S00.03XA]    Assessment:      05/15/18 1455   Assessment Details   Assessment Type Continued Assessment   Date of Care Management Update 05/15/18   Date of Next DCP Update 05/18/18   Care Management Plan   Discharge Planning Status plan in progress   Projected Discharge Date 05/18/18   Discharge plan discussed with: Health Care Surrogate   CM will evaluate for rehabilitation potential yes   Patient choice offered to patient/family yes   Form for patient choice reviewed/signed and on chart no   Patient aware of possible cost for ambulance transport?  No   Discharge Needs Assessment   Discharge Facility/Level of Care Needs SNF Placement (Medicare certified)(code 3)     MSW contacted Pt's HCS Antonio Gillespie at [phone] to discuss a discharge plan and offer choice. Provided Healthcare Surrogate Allscripts listing with CMS ratings for SNF serving the [address] zip code. Antonio Gillespie explained that he is open to SNF placement at a facility in [place]. Antonio Gillespie states that [facility] and [facility] and [facility] are his first choices.     Discharge Plan:  SNF Placement (Medicare certified) (code 3)    The patient will continue to be evaluated for developing discharge needs.     Case Manager: Placido Sou, SOCIAL WORKER  Phone: [phone]

## 2018-05-15 NOTE — Care Management Notes (Addendum)
Asked to assist with allscripts referrals for SNF placement. Primary MSW-MSW Alphonzo Lemmings having issues with allscripts system.   Sent referrals to :[facility], [facility], [address] and [place], [place], [place], [place], and [place], that are all in region patient looking for. Patient's preference are [facility] or [facility]. Will attempt to honor preference as long as one of those two facilities can accept.     [facility] working with family to try to establish [place] medicaid. Appears family mailed app on 3/26. Does not appear to have been submitted yet. Will f/u with [facility].

## 2018-05-15 NOTE — Consults (Signed)
[facility]  Supportive/Palliative Care Medicine Consult  Follow Up Note    Antonio Gillespie, Antonio Gillespie, 58 y.o. male  Date of Service: 05/15/2018  Date of Birth:  1960/09/08    Hospital Day:  LOS: 17 days     Assessment/Recommendations: Encounter for palliative care. Adult Failure to Thrive secondary to acute hypoxic/hypercarbic respiratory failure s/p tracheostomy placement.    Goals of care discussion: Pt lacks DMC. Spoke with pt's brother/HCS [name]) at length via telephone regarding pt's medical problems and treatment options. We reviewed that pt acutely decompensated yesterday 05/14/2018 (tachycardia, possible seizure-like activity), but that he is currently stable. [name] wishes to medically optimize pt as much as possible without escalation of care. We also discussed hospice options. [name] would like for the pt to be discharged to SNF with hospice. In the interim, he wishes for the following treatment limitations: DNR, DNI, No escalation of care, No vasopressors, No ICU transfer.     Plan of care was communicated with primary team.     Thank you for this consult. Supportive care will sign off. Please contact us if we can be of further assistance.      Pain: PainAD  0    POST completed:  POST form not discussed    Subjective: Pt restless in bed this morning. Afebrile. His HR is 70's-80's. No constipation.     Objective:  Temperature: 36.6 C (97.9 F)  Heart Rate: 84  BP (Non-Invasive): 134/84  Respiratory Rate: (!) 23  SpO2: 100 %  Pain Score (Numeric, Faces): Other  Physical Exam:  General: appears acutely ill and no distress  Eyes: Conjunctiva clear. L pupil 6 mm, round, sluggish. R pupil 5 mm, round, fixed.  ENT: Mouth mucous membranes moist.   Neck: supple, symmetrical, trachea midline. Trach present.   Lungs: Coarse bilaterally. No wheezes or rales   Cardiovascular: regular rate and rhythm, S1, S2 normal, no murmur, click, rub or gallop. 2+ edema to bilateral hands. +2 R radial and L pedal pulses.   Abdomen: Soft,  non-tender, Bowel sounds normal, non-distended  Musculoskeletal: Head atraumatic and normocephalic  Skin: Skin warm and dry, No overt rashes   Neurologic: Alert. Attempts to mouth words. Does not follow commands.    Psychiatric: restless   Other symptoms present: Delirium        Labs:    Lab Results Today:    Results for orders placed or performed during the hospital encounter of 04/28/18 (from the past 24 hour(s))   POC BLOOD GLUCOSE (RESULTS)   Result Value Ref Range    GLUCOSE, POC 87 70 - 105 mg/dl       Ventilator Settings: N/A  HFNC Settings: N/A  BIPAP Settings: $ O2 Delivery: BP  Oxygen Concentration (%): 30   Mode: S/T  Spontaneous Volume: 420 mL  Set Rate: 4  Spontaneous Rate: 28 Breaths Per Minute  Minute Volume: 11 LPM  FiO2 Set: 30%  IPAP: 16 cmH20  EPAP: 8 cmH2O  PIP: 16 cmH2O  Rise Time %: 2  I-Time: 1.2 seconds      Imaging Studies:   Results for orders placed or performed during the hospital encounter of 04/28/18 (from the past 72 hour(s))   XR AP MOBILE CHEST     Status: None    Narrative    Antonio Gillespie  Male, 58 years old.    XR AP MOBILE CHEST performed on 05/13/2018 7:07 AM.    REASON FOR EXAM:  Increased respiratory effort  TECHNIQUE: 1 view/1 image(s) submitted for interpretation.    COMPARISON: May 12, 2018.    FINDINGS: There is increased hazy opacification of the left hemithorax.   The  right lung is clear. Heart size is stable.    Tracheostomy tube remains in place.        Impression    Mildly increased hazy opacification of the left hemithorax is favored to  represent layering pleural fluid and associated atelectasis/consolidation.     XR AP MOBILE CHEST     Status: None    Narrative    Antonio Gillespie  Male, 58 years old.    XR AP MOBILE CHEST performed on 05/14/2018 8:54 AM.    REASON FOR EXAM:  Tachycardia    TECHNIQUE: 1 view/1 image(s) submitted for interpretation.    COMPARISON: May 13, 2018.    FINDINGS: Minimal atelectasis is present at the left lung base. No other  focal  consolidation, pleural effusion, or pneumothorax is identified. Heart  size is stable.    Tracheostomy tube remains in place.        Impression    1.  Minimal left basilar atelectasis.  2.  Cardiomegaly.         Patient/Family Meeting held, present were: pt's brother/HCS [name]) and R. Hudik, FNP    Discussion with patient and family included: diagnoses, current problems, prognosis of weeks to months, treatment limitations, treatment options, hospice, pt/family goals for care and other symptom management    Location of pt/family meeting:  on telephone due to patient's lack of [facility] and family unable to visit   Outcome of pt/family meeting: No CPR, Do Not Intubate, No vasopressors, No escalation of care, and No ICU transfer    Consultant:  [name], APRN,NP-C  Total Time of Encounter:  45 minutes   Time in Counseling as above:  30 minutes   Start time:  1200  Stop Time:  [address]      [name], APRN,NP-C  05/15/2018, 12:45

## 2018-05-15 NOTE — Care Plan (Signed)
Delivery Mode INVASIVE    FIO2 (%) 30    IPAP level (cm/H2O) 16    EPAP level (cm/H2O) 8    Indications IMPROVE VENTILATION    Respiratory Rate: OTHER    OTHER 4.    PRN Bipap Allowed During Day?(only for qHS frequency) Yes    NasoGastric/OroGastric Tube (Full Face Only) WITHOUT      Pt on continuous BiPAP. Possible CMO today. Will continue to monitor

## 2018-05-15 NOTE — Care Plan (Signed)
Vital signs stable. Patient more alert today. Remains on BiPap. Tolerating tube feeds. Q 2 hour turns completed. Plan for goal clarification with family. Plan of care reviewed with patient and family. Will continue to monitor.

## 2018-05-15 NOTE — Progress Notes (Signed)
Family Phone call    The designated family member was not able to be reached by phone for updates.  As the designated family contact could not be reach, an attempt will be made tomorrow to communicate with the patient's family. Family member spoke with supportive care team earlier today about patient's goals of care.     Karma Greaser, MD  05/15/2018, 18:16

## 2018-05-15 NOTE — Care Plan (Signed)
Pt increased on BiPAP 50/20/8 per md

## 2018-05-15 NOTE — Care Plan (Signed)
Spoke with RN re: speaking valve evaluation. Pt currently on BiPAP and reportedly has copious tracheal secretions. RN will page when pt appropriate for SV trial.

## 2018-05-16 LAB — POC BLOOD GLUCOSE (RESULTS)
GLUCOSE, POC: 124 mg/dL — ABNORMAL HIGH (ref 70–105)
GLUCOSE, POC: 119 mg/dL — ABNORMAL HIGH (ref 70–105)
GLUCOSE, POC: 129 mg/dL — ABNORMAL HIGH (ref 70–105)

## 2018-05-16 LAB — BASIC METABOLIC PANEL
ANION GAP: 4 mmol/L (ref 4–13)
ANION GAP: 4 mmol/L (ref 4–13)
BUN/CREA RATIO: 11 (ref 6–22)
BUN: 5 mg/dL — ABNORMAL LOW (ref 8–25)
CALCIUM: 6.1 mg/dL — CL (ref 8.5–10.2)
CHLORIDE: 111 mmol/L (ref 96–111)
CO2 TOTAL: 21 mmol/L — ABNORMAL LOW (ref 22–32)
CREATININE: 0.46 mg/dL — ABNORMAL LOW (ref 0.62–1.27)
ESTIMATED GFR: >60 mL/min/1.73mˆ2 (ref >60)
GLUCOSE: 122 mg/dL (ref 65–139)
POTASSIUM: 4.8 mmol/L (ref 3.5–5.1)
SODIUM: 136 mmol/L (ref 136–145)

## 2018-05-16 LAB — CBC
HCT: 22 % — ABNORMAL LOW (ref 38.9–52.0)
HGB: 6.3 g/dL — CL (ref 13.4–17.5)
MCH: 26.9 pg (ref 26.0–32.0)
MCHC: 28.6 g/dL — ABNORMAL LOW (ref 31.0–35.5)
MCV: 94 fL (ref 78.0–100.0)
MPV: 11.3 fL (ref 8.7–12.5)
PLATELETS: 339 x10ˆ3/uL (ref 150–400)
RBC: 2.34 x10ˆ6/uL — ABNORMAL LOW (ref 4.50–6.10)
RDW-CV: 18.2 % — ABNORMAL HIGH (ref 11.5–15.5)
WBC: 6.6 x10ˆ3/uL (ref 3.7–11.0)

## 2018-05-16 LAB — PHOSPHORUS: PHOSPHORUS: 2.8 mg/dL (ref 2.4–4.7)

## 2018-05-16 LAB — VENOUS BLOOD GAS
%FIO2 (VENOUS): 50 %
BASE EXCESS: 3.4 mmol/L — ABNORMAL HIGH (ref ?–3.0)
BICARBONATE (VENOUS): 27.4 mmol/L — ABNORMAL HIGH (ref 22.0–26.0)
PCO2 (VENOUS): 55 mmHg — ABNORMAL HIGH (ref 41.00–51.00)
PH (VENOUS): 7.35 (ref 7.31–7.41)
PO2 (VENOUS): 67 mmHg — ABNORMAL HIGH (ref 35.0–50.0)

## 2018-05-16 LAB — H & H
HCT: 39.3 % (ref 38.9–52.0)
HGB: 11.5 g/dL — ABNORMAL LOW (ref 13.4–17.5)

## 2018-05-16 LAB — IONIZED CALCIUM WITH PH
IONIZED CALCIUM: 1.16 mmol/L (ref 1.10–1.35)
PH (VENOUS): 7.41 (ref 7.31–7.41)

## 2018-05-16 LAB — MAGNESIUM: MAGNESIUM: 1.6 mg/dL (ref 1.6–2.6)

## 2018-05-16 MED ORDER — SODIUM CHLORIDE 0.9 % (FLUSH) INJECTION SYRINGE
10.0000 mL | INJECTION | Freq: Three times a day (TID) | INTRAMUSCULAR | Status: DC
Start: 2018-05-16 — End: 2018-05-19
  Administered 2018-05-16: 10 mL
  Administered 2018-05-17: 0 mL
  Administered 2018-05-17 (×2): 10 mL
  Administered 2018-05-18: 0 mL
  Administered 2018-05-18: 10 mL
  Administered 2018-05-19: 0 mL
  Administered 2018-05-19: 06:00:00

## 2018-05-16 MED ORDER — SODIUM CHLORIDE 0.9 % IV BOLUS
40.00 mL | INJECTION | Freq: Once | Status: AC | PRN
Start: 2018-05-16 — End: 2018-05-16

## 2018-05-16 MED ORDER — LIDOCAINE (PF) 10 MG/ML (1 %) INJECTION SOLUTION
0.5000 mL | Freq: Once | INTRAMUSCULAR | Status: AC
Start: 2018-05-16 — End: 2018-05-16
  Administered 2018-05-16: 0.5 mL via INTRADERMAL

## 2018-05-16 MED ORDER — MAGNESIUM OXIDE 400 MG (241.3 MG MAGNESIUM) TABLET
400.0000 mg | ORAL_TABLET | Freq: Two times a day (BID) | ORAL | Status: DC
Start: 2018-05-16 — End: 2018-05-18
  Administered 2018-05-16 – 2018-05-18 (×4): 400 mg via GASTROSTOMY
  Filled 2018-05-16 (×4): qty 1

## 2018-05-16 MED ORDER — SODIUM CHLORIDE 0.9 % (FLUSH) INJECTION SYRINGE
20.0000 mL | INJECTION | INTRAMUSCULAR | Status: DC | PRN
Start: 2018-05-16 — End: 2018-05-19

## 2018-05-16 MED ADMIN — chlorhexidine gluconate 0.12 % mouthwash: ORAL | @ 09:00:00

## 2018-05-16 MED ADMIN — sodium chloride 0.9 % (flush) injection syringe: @ 22:00:00

## 2018-05-16 MED ADMIN — Lactobacillus rhamnosus GG 15 billion cell sprinkle capsule: GASTROSTOMY | @ 09:00:00

## 2018-05-16 MED ADMIN — PREMIXED HEMODIALYSATE W/ ADDITIVES: SUBCUTANEOUS | @ 22:00:00

## 2018-05-16 MED ADMIN — senna leaf extract 176 mg/5 mL oral syrup: GASTROSTOMY | @ 09:00:00

## 2018-05-16 MED ADMIN — PREMIXED HEMODIALYSATE W/ ADDITIVES: GASTROSTOMY | @ 09:00:00

## 2018-05-16 MED ADMIN — enoxaparin 40 mg/0.4 mL subcutaneous syringe: INTRAVENOUS | @ 15:00:00

## 2018-05-16 MED ADMIN — sodium chloride 0.9 % (flush) injection syringe: GASTROSTOMY | @ 09:00:00

## 2018-05-16 MED ADMIN — HYDROcodone 5 mg-acetaminophen 325 mg tablet: TOPICAL | @ 21:00:00

## 2018-05-16 MED ADMIN — insulin regular human 100 unit/mL injection solution: GASTROSTOMY | @ 22:00:00

## 2018-05-16 MED ADMIN — oxyCODONE 5 mg tablet: @ 06:00:00

## 2018-05-16 MED ADMIN — sodium chloride 0.9 % (flush) injection syringe: ORAL | @ 22:00:00

## 2018-05-16 NOTE — Progress Notes (Signed)
[facility]                                               SICU PROGRESS NOTE    [name], [name]  Date of Admission:  04/28/2018  Date of Service: 05/16/2018  Date of Birth:  Jun 20, 1960    Primary Attending:  [name]  Primary Service:  Trauma Blue     LOS: 18 days      Subjective:    Complained of thirst in AM. Noted to have low Hb overnight.     Vital Signs:  Temp (24hrs) Max:37.6 C (62.7 F)      Systolic (03JKK), XFG:182 , Min:106 , XHB:716     Diastolic (96VEL), FYB:01, Min:64, Max:118    Temp  Avg: 36.6 C (97.9 F)  Min: 36 C (96.8 F)  Max: 37.6 C (99.7 F)  MAP (Non-Invasive)  Avg: 98.5 mmHG  Min: 78 mmHG  Max: 128 mmHG  Pulse  Avg: 88.6  Min: 77  Max: 109  Resp  Avg: 24  Min: 11  Max: 35  SpO2  Avg: 95.2 %  Min: 79 %  Max: 100 %  Pain Score (Numeric, Faces): Other    Meds  No current outpatient medications on file.     acetaminophen (TYLENOL) 160 mg per 5 mL liquid - grape flavored, 650 mg, Gastric (NG, OG, PEG, GT), Q4H PRN  aspirin chewable tablet 81 mg, 81 mg, Gastric (NG, OG, PEG, GT), Daily  chlorhexidine gluconate (PERIDEX) 0.12% mouthwash, 15 mL, Swish & Spit, 2x/day  electrolyte-A (PLASMALYTE-A) premix infusion, , Intravenous, Continuous  enoxaparin PF (LOVENOX) 60 mg/0.6 mL SubQ injection, 0.5 mg/kg, Subcutaneous, Q12H  famotidine (PEPCID) tablet, 20 mg, Gastric (NG, OG, PEG, GT), 2x/day  gabapentin (NEURONTIN) 389m per 659moral liquid, 400 mg, Gastric (NG, OG, PEG, GT), 3x/day  ipratropium-albuterol 0.5 mg-3 mg(2.5 mg base)/3 mL Solution for Nebulization, 3 mL, Nebulization, 4x/day PRN  lactobacillus rhamnosus (CULTURELLE) active cultures capsule, 1 Cap, Gastric (NG, OG, PEG, GT), 2x/day-Food  lanolin-oxyquin-pet, hydrophil (BAG BALM) topical ointment, , Apply Topically, 2x/day PRN  melatonin tablet, 6 mg, Gastric (NG, OG, PEG, GT), NIGHTLY  metoprolol tartrate (LOPRESSOR) tablet, 12.5 mg, Gastric (NG, OG, PEG, GT), 2x/day  NS flush syringe, 2 mL, Intracatheter, Q8HRS     And  NS flush syringe, 2-6 mL, Intracatheter, Q1 MIN PRN  nutrition protein supplement 15 g per 30 mL packet, 2 Packet, Gastric (NG, OG, PEG, GT), Daily  nystatin (NYSTOP) 100,000 units/g topical powder, , Apply Topically, 2x/day  prenatal vitamin-iron-folic acid tablet, 1 Tab, Gastric (NG, OG, PEG, GT), Daily  scopolamine 1 mg over 3 days transdermal patch, 1 Patch, Transdermal, Q72H  senna concentrate (SENNA) 5282mer 60m71mal liquid, 5 mL, Gastric (NG, OG, PEG, GT), 2x/day  SSIP insulin R human (HUMULIN R) 100 units/mL injection, 0-12 Units, Subcutaneous, Q4H PRN        Physical Exam:   GEN:BPZ:WCHENIDPOEU, frail male appears older than stated age, awake and attempting to communicate  HEENT:Pupils reactive bilaterally   NECK:Trachea midline, tracheostomy in place  PULM:Diminished breath sounds bilaterally, course, Tracheostomy in place, small amount of blood streaked secretions in suction cannula, secretions currently not blood streaked   CARDIAC:Afib   CHEST::No abrasions or contusions  ABD:MPN:TIRWERXt, and nondistended  PELVIS:No evidence of injury  GU/RECTAL:large scrotal edema resolving  BACK:No evidence of injury and no step-off  IO:NGEXBM noted on bilateral knees and feet  NEURO:Intubated, following commands  GLASGOW COMA SCALE:Eye opening:4 spontaneous, Verbal response:1Trachostomy , Best motor response:6  VASCULAR:All pulses palpable and equal bilaterally  INTEGUMENTARY:Pink, warm, and dry andanasarca    Labs:  I have reviewed all lab results.          Assessment/ Plan:  Active Hospital Problems    Diagnosis   . Primary Problem: Fall   . Altered mental status, unspecified   . Atrial fibrillation (CMS HCC)   . Failure to wean from mechanical ventilation (CMS HCC)   . Anemia   . COPD (chronic obstructive pulmonary disease) (CMS HCC)   . Thrombocytopenia Unspecified   . Hypokalemia   . Alcohol abuse   . Acute respiratory failure (CMS HCC)   . Nasal fracture   . Abrasions  of multiple sites   . Scalp hematoma   . Humeral fracture   . Left inguinal hernia   . Anasarca       Antonio Gillespie is a 58 y.o. male who is 13 Days Post-Op S/P Procedure(s) (LRB):  TRACHEOSTOMY (N/A)  GASTROSCOPY WITH PLACEMENT PEG TUBE (N/A)      NEURO:  GCS: E4=Spontaneous (Opens Eyes on Own) M6=Normal (Follows Simple Commands) V1=None (Makes No Noise or Intubated) Tracheostomy  Sedation/analgesia:    Gabapentin 400 TID    Tylenol PRN     Scopolamine patch for secretions  Melatonin nightly  Prenatal vitamin    CARDIOVASCULAR:  Systolic (84XLK), GMW:102 , Min:106 , VOZ:366     Diastolic (44IHK), VQQ:59, Min:64, Max:118     ART-Line  MAP: 85 mmHg   Troponins: No results found for: CKMB    Meds: aspirin, metoprolol 12.5 mg       PULMONARY:  Airway Ventilator Settings   Tracheostomy 8 Cuffed (Active)   Trach Status Secured 05/08/2018  3:37 AM   Site Condition Intact 05/07/2018  8:00 PM   [place] Site Cleaned 05/07/2018  8:00 PM   [place] @ Bedside? Yes 05/07/2018  8:00 PM   Bilateral General Breath Sounds Coarse;Expiratory Wheezes 05/07/2018  8:00 PM    Ventilator Settings:      SpO2  Avg: 95.2 %  Min: 79 %  Max: 100 %  Blood Gas:  Recent Labs     05/15/18  1301 05/15/18  1625   FI02 30.0 50.0   PH 7.26* 7.24*   PCO2 62.00* 60.00*   PO2 52.0* 54.0*   BICARBONATE 24.0 22.3   BASEDEFICIT 0.6 2.8     Nebs: albuterol TID, Ipratropium-albuterol,   BAL 3/21--> 30,000 S. Aureus, <100 GNR  BiPAP settings overnight increased to 20/10 Fio2 50%    Bronchoscopy:   Copious amount of secretions  BAL collected   No organisms seen    GI:  MNT PROTOCOL FOR DIETITIAN  DIET NPO - NOW  ADULT TUBE FEEDING - CONTINUOUS DRIP CONTINUOUS NO MEALS, TF ONLY; OSMOLITE 1.5; OG; Initial Rate (ml/hr): 10; Increase by: 10cc Q4 hours to goal; Goal Rate (ml/hr): 60   Recent Labs     05/14/18  0652   ALBUMIN 1.6*     Last BM: Last Bowel Movement: 05/15/18  Proph: Hold bowel regimen for diarrhea, if severe consider rectal tube, famotidine    TF at  goal    RENAL/GU:  Recent Labs     05/14/18  0652 05/14/18  0711 05/15/18  1300 05/15/18  1301 05/15/18  1625 05/16/18  0105   SODIUM 139  --  138  --   --  136   POTASSIUM 3.7  --  3.2*  --   --  4.8   CHLORIDE 101  --  103  --   --  111   BICARBONATE  --  25.1  --  24.0 22.3  --    BUN 6*  --  6*  --   --  5*   CREATININE 0.70  --  0.54*  --   --  0.46*   ANIONGAP 11  --  7  --   --  4   CALCIUM 8.1*  --  7.6*  --   --  6.1*   MAGNESIUM 2.2  --  2.2  --   --  1.6   PHOSPHORUS 5.0*  --  4.9*  --   --  2.8       I/O last 24 hours:      Intake/Output Summary (Last 24 hours) at 05/16/2018 0801  Last data filed at 05/16/2018 0700  Gross per 24 hour   Intake 3080 ml   Output --   Net 3080 ml     I/O current shift:  03/31 0700 - 03/31 1859  In: 135 [I.V.:75; GT:60]  Out: -   No foley          HEME:  Recent Labs     05/14/18  0652 05/15/18  1300 05/16/18  0105   HGB 8.7* 8.7* 6.3*   HCT 29.6* 30.4* 22.0*   PLTCNT 591* 489* 339     Transfusions:   Proph: SCD's, Lovenox       ID:  Temp (24hrs) Max:37.6 C (99.7 F)    Recent Labs     05/14/18  0652 05/15/18  1300 05/16/18  0105   WBC 11.2* 8.4 6.6   PMNS 69 72  --            Cultures:  ABX: Augmentin end date 3/28  No ABX  Pro-cal normal    New blood cultures drawn   3/23 - no growth  G+C negative    ENDO:  SSI    MSK:  No skin breakdown    OTHER:  Lines: peripheral IV x2, foley, gastrostomy tube  Activity: Bedrest, HOB 30deg  PT/OT:  SNF  MNT:  ordered    PLAN:  Continue BIPAP and trach collar  No escalation of care, No Vasopressors, No tx back to ICU once on floor, DNR/DNI      Dimple Nanas, MD  Trauma/Surgical Critical Care/Acute Care Surgery Staff    I saw and examined the patient. I reviewed the residents note. I agree with the findings and plan of care as documented in the resident's note. Any exceptions/additions are edited/noted.  Neuro: Mental status waxes and wanes.Overall more alert. GCS 11T  Continuemultimodal pain medication  Known alcoholic. Scheduled  ativan with tapercompleted  Continuemultimodal pain medication  Sleep:melatonin    Resp:   Respiratory Rate: 20  SpO2: 100 %  Recent Labs     05/15/18  1301 05/15/18  1625 05/16/18  0917   FI02 30.0 50.0 50.0   PH 7.26* 7.24* 7.41  7.35   PCO2 62.00* 60.00* 55.00*   PO2 52.0* 54.0* 67.0*   BICARBONATE 24.0 22.3 27.4*   BASEEXCESS  --   --  3.4*   BASEDEFICIT 0.6 2.8  --       S/p trach- on BIPAP. Cont to wean as tol. Will  try 1 hour of ATC and see how tolerates  Did not receive yesterday as resp thought he may go to [facility]   Continue aggressive pulmonary hygiene/IS  scoplamine patch for secretions    If persists, can schedule guaifensin as well     CV:  SBP over last 24:  Systolic (47WGN), FAO:130 , Min:106 , Max:158   Afib with RVR resolved.  Hemodynamically acceptable. Cont to monitor  Metoprolol 12.5 BID for arrhythmias    GI:       Diet:NPO         TFs: @ goal  S/p PEG   Bowel Reg-hold with liquid BMs- improved over last 2 shifts                        Last Bowel Movement: 05/15/18   Lactobacillus started yesterday (was on Augmentin previously)   GI PPX: DC        Recent Labs     05/14/18  0652   ALBUMIN 1.6*   protein malnutrition    ENDO: For hyperglycemia management: ISS: DC as not requiring    Renal/FEN:   QM:VHQI Monitor Uo,  IVFS: none  Lytes-no replacements    Heme: Ac Blood Loss Anemia-asx. Ensure all blood draws are pedi tubes to min volume taken.  Repeat Hgb improved. No transfusion at this time   Temperature: 36.4 C (97.5 F)        Recent Labs     05/15/18  1300 05/16/18  0105 05/16/18  0917   WBC 8.4 6.6  --    HGB 8.7* 6.3* 11.5*   HCT 30.4* 22.0* 39.3   PLTCNT 489* 339  --        ID: Temp (12hrs), Avg:36.4 C (97.6 F), Min:36.4 C (97.5 F), Max:36.5 C (97.7 F)  Afebrile    Skin/Musculoskeletal:   Extremities:humeral fx- nonop per ortho  Breakdown:scattered abrasions  continue local wound care  DVT ONG:EXBM, Lovenox/SQ Heparin  Therapies: PT/OT/Speech  ActivityNWB to upper  extremity with fracture  Disposition:cont ICU  Code Status:DNR. no escalation     Critical Care Attestation  Patient withAcute hypoxic/hyercarbic Respiratory insufficiency/Failure requiring frequent monitoring and assessment including including aggressive pulmonary hygiene.  Continue to wean BIPAP with goal to ATC    This patient suffers from failure or dysfunction of Neurologic/Sensory, Respiratory, Cardiovascular, GI/Hepatopancreaticobiliary, Infectious/Hematologic and Musculoskeletalsystem(s).     I was present at the bedside of this critically ill patient exclusive of procedures.The care of this patient was in regard to managing (a) conditions(s) that has a high probability of sudden, clinically significant, or life-threatening deterioration and required a high degree of Attending Physician attention and direct involvement to intervene urgently.   Data review and care planning was performed in direct proximity of the patient, examination was obviously performed in direct contact with the patient. All of this time was exclusive of procedure which will be documented elsewhere in the chart.    My critical care time is independent and unique to other providers (no other providers saw patient for purposes of SICU evaluation)  My critical care time involved full attention to the patients'condition and included:    Review of nursing notes and/or old charts  Review of medications, allergies, and vital signs  Documentation time  Consultant collaboration on findings and treatment options  Care, transfer of care, and discharge plans  Ordering, interpreting, and reviewing diagnostic studies/tab tests  Obtaining necessary history from family, EMS, nursing home staff and/or treating  physicians    My critical care time did not include time spent teaching resident physician(s) or other services of resident physicians, or performing other reported procedures.    Total Critical Care  Time:18mnutes    Electronically Signed by:    CTawana ScaleMD, MS  Assistant Professor of Surgery  Trauma, Surgical Critical Care, and ALa Pryor 05/16/2018 08:23

## 2018-05-16 NOTE — Progress Notes (Signed)
Family Phone call    The designated family member was able to be reached by phone for updates.  An update of the patient's status was reviewed and all questions were answered.     Of note, patient's brother's name is Janyth Contes (not Rolland Porter) which was incorrectly listed in chart and on surrogate paperwork. Care management was notified via unit clerk to change name on paperwork. Patient's brother also requested copy of surrogate paperwork to be mailed to him.     Karma Greaser, MD  05/16/2018, 18:16

## 2018-05-16 NOTE — Progress Notes (Signed)
[facility]                                            Trauma Progress Note                 Date of Birth:  03/24/1960  Date of Admission:  04/28/2018  Date of service: 05/16/2018    Jearld Lesch, 58 y.o., male Post trauma day 18 status post Fall    Events over the last 24 hours have included:  Overnight no events, fi02 increased to 50%.     Subjective:    Patient more responsive today though not consistently following commands. On BiPaP.       Objective   24 Hour Summary:    Filed Vitals:    05/16/18 0800 05/16/18 1030 05/16/18 1200 05/16/18 1234   BP:       Pulse:       Resp:       Temp: 36.4 C (97.5 F)  36.6 C (97.9 F)    SpO2:  100%  100%     Labs:  Recent Labs     05/14/18  0652  05/15/18  1300 05/15/18  1301 05/15/18  1625 05/16/18  0105 05/16/18  0917   WBC 11.2*  --  8.4  --   --  6.6  --    HGB 8.7*  --  8.7*  --   --  6.3* 11.5*   HCT 29.6*  --  30.4*  --   --  22.0* 39.3   SODIUM 139  --  138  --   --  136  --    POTASSIUM 3.7  --  3.2*  --   --  4.8  --    CHLORIDE 101  --  103  --   --  111  --    BICARBONATE  --    < >  --  24.0 22.3  --  27.4*   BUN 6*  --  6*  --   --  5*  --    CREATININE 0.70  --  0.54*  --   --  0.46*  --    ANIONGAP 11  --  7  --   --  4  --    CALCIUM 8.1*  --  7.6*  --   --  6.1*  --    MAGNESIUM 2.2  --  2.2  --   --  1.6  --    PHOSPHORUS 5.0*  --  4.9*  --   --  2.8  --     < > = values in this interval not displayed.       Intake/Output Summary (Last 24 hours) at 05/16/2018 1339  Last data filed at 05/16/2018 1300  Gross per 24 hour   Intake 3290 ml   Output --   Net 3290 ml     Nutrition Management: MNT PROTOCOL FOR DIETITIAN  DIET NPO - NOW  ADULT TUBE FEEDING - CONTINUOUS DRIP CONTINUOUS NO MEALS, TF ONLY; OSMOLITE 1.5; OG; Initial Rate (ml/hr): 10; Increase by: 10cc Q4 hours to goal; Goal Rate (ml/hr): 60 Last Bowel Movement: 05/15/18  Recent Labs     05/14/18  0652   ALBUMIN 1.6*     Current Medications:    Current Facility-Administered Medications:   .   acetaminophen (TYLENOL)  160 mg per 5 mL liquid - grape flavored, 650 mg, Gastric (NG, OG, PEG, GT), Q4H PRN, Verlon Au, MD, 650 mg at 05/15/18 2120  .  aspirin chewable tablet 81 mg, 81 mg, Gastric (NG, OG, PEG, GT), Daily, Verlon Au, MD, 81 mg at 05/16/18 0852  .  chlorhexidine gluconate (PERIDEX) 0.12% mouthwash, 15 mL, Swish & Delsa Grana, 2x/day, Verlon Au, MD, 15 mL at 05/16/18 0853  .  electrolyte-A (PLASMALYTE-A) premix infusion, , Intravenous, Continuous, Dela'O, Connie, MD, Last Rate: 75 mL/hr at 05/15/18 1538  .  enoxaparin PF (LOVENOX) 60 mg/0.6 mL SubQ injection, 0.5 mg/kg, Subcutaneous, Q12H, Verlon Au, MD, 50 mg at 05/16/18 0086  .  famotidine (PEPCID) tablet, 20 mg, Gastric (NG, OG, PEG, GT), 2x/day, Verlon Au, MD, 20 mg at 05/16/18 0853  .  gabapentin (NEURONTIN) 36m per 643moral liquid, 400 mg, Gastric (NG, OG, PEG, GT), 3x/day, BoVerlon AuMD, 400 mg at 05/16/18 0853  .  ipratropium-albuterol 0.5 mg-3 mg(2.5 mg base)/3 mL Solution for Nebulization, 3 mL, Nebulization, 4x/day PRN, BoVerlon AuMD, 3 mL at 05/13/18 0407  .  lactobacillus rhamnosus (CULTURELLE) active cultures capsule, 1 Cap, Gastric (NG, OG, PEG, GT), 2x/day-Food, BaMarquette OldMD, 1 Cap at 05/16/18 [phone].  lanolin-oxyquin-pet, hydrophil (BAG BALM) topical ointment, , Apply Topically, 2x/day PRN, BoVerlon AuMD  .  melatonin tablet, 6 mg, Gastric (NG, OG, PEG, GT), NIGHTLY, BoVerlon AuMD, 6 mg at 05/15/18 2120  .  metoprolol tartrate (LOPRESSOR) tablet, 12.5 mg, Gastric (NG, OG, PEG, GT), 2x/day, BaMarquette OldMD, 12.5 mg at 05/16/18 0854  .  NS bolus infusion 40 mL, 40 mL, Intravenous, Once PRN, SpDimple NanasMD  .  INSERT & MAINTAIN PERIPHERAL IV ACCESS, , , UNTIL DISCONTINUED **AND** DRESSING CHANGE Saline Lock; DRY STERILE, , , PRN **AND** NS flush syringe, 2 mL, Intracatheter, Q8HRS, 2 mL at 05/16/18 0600 **AND** NS flush syringe, 2-6 mL, Intracatheter, Q1 MIN PRN, BoVerlon Au MD  .  nutrition protein supplement 15 g per 30 mL packet, 2 Packet, Gastric (NG, OG, PEG, GT), Daily, BoVerlon AuMD, 30 g at 05/16/18 0900  .  nystatin (NYSTOP) 100,000 units/g topical powder, , Apply Topically, 2x/day, BoVerlon AuMD  .  prenatal vitamin-iron-folic acid tablet, 1 Tab, Gastric (NG, OG, PEG, GT), Daily, BoVerlon AuMD, 1 Tab at 05/16/18 0900  .  scopolamine 1 mg over 3 days transdermal patch, 1 Patch, Transdermal, Q72H, BaMarquette OldMD, 1 Patch at 05/15/18 0226    Today's Physical Exam:  GEN:   NAD and Appears chronically Ill  NECK:   Trach in place, on BiPap  PULM:   Breath sounds coarse, on BiPap  CV:   Regular rate and rhythm  ABD:   Distended, PEG in place. Large left inguinal hernia, reducible, no erythema of overlying skin.  NEURO:  Awakens to stimuli.   Integumentary:  Pink, warm, and dry. Scattered dry wounds over lower extremities and left upper lip    Assessment/ Plan:   Active Hospital Problems   (*Primary Problem)    Diagnosis   . *Fall     "found down" and taken to outside hospital     . Altered mental status, unspecified   . Atrial fibrillation (CMS HCC)   . Failure to wean from mechanical ventilation (CMS HCC)   . Anemia   . COPD (chronic obstructive pulmonary disease) (CMS HCC)     Chronic   . Thrombocytopenia Unspecified   .  Hypokalemia   . Alcohol abuse   . Acute respiratory failure (CMS HCC)   . Nasal fracture     Acute vs chronic     . Abrasions of multiple sites   . Scalp hematoma   . Humeral fracture     nonop per ortho     . Left inguinal hernia     S/p reduction     . Anasarca     DVT prophylaxis:  Enoxaparin and SCDs/ Venodynes/Impulse boots  Anticoagulants (last 24 hours)     Date/Time Action Medication Dose    05/10/18 2106 Given    enoxaparin PF (LOVENOX) 60 mg/0.6 mL SubQ injection 50 mg    05/10/18 0826 Given    enoxaparin PF (LOVENOX) 60 mg/0.6 mL SubQ injection 50 mg        Nutrition: MNT PROTOCOL FOR DIETITIAN  DIET NPO - NOW  ADULT TUBE FEEDING -  CONTINUOUS DRIP CONTINUOUS NO MEALS, TF ONLY; OSMOLITE 1.5; OG; Initial Rate (ml/hr): 10; Increase by: 10cc Q4 hours to goal; Goal Rate (ml/hr): 60 diet, Last Bowel Movement: 05/15/18  Activity: OOB to chair   RUE nonweight bearing  Pain:   Analgesics (last 24 hours)     Date/Time Action Medication Dose    05/11/18 0511 Given    acetaminophen (TYLENOL) 160 mg per 5 mL liquid - grape flavored 650 mg    05/11/18 0044 Given    acetaminophen (TYLENOL) 160 mg per 5 mL liquid - grape flavored 650 mg        PT Recommendations: skilled nursing facility    Pneumonia   - BAL 3/26 - PMNs, no organisms    R Humerus Fracture   - Ortho Consult   - Non-op and NWB    KUB with air throughout the colon, mild distention   - Advance tube feeds to goal as tolerated   - Continue bowel regimen   - Keep HOB at 30    Supportive Care consult   - Patient currently no CPR, no intubation, no lab studies/diagnostic tests   - 3/27 - Discussed plan to continue to wean off BiPAP    - will plan for family meeting via conference call with supportive care on 4/3.        Plan:  -Supportive care to touch base with family about possible hospice. Wean vent when able     Perrin Smack, MD  05/16/2018, 13:39      I saw and examined the patient.  I reviewed the resident's note.  I agree with the findings and plan of care as documented in the resident's note.  Any exceptions/additions are edited/noted.    Ann Held, MD

## 2018-05-16 NOTE — Care Plan (Signed)
References: [facility] - CPAP/BiPAP Adjustment Protocol    [place] - Bi-Level Ventilation - Respironics BiPAP V60 Policy    [place] - Managing Routine or Acute Respiratory Failure with NPPV Algorithm   Question Answer Comment   Delivery Mode INVASIVE    FIO2 (%) 50    IPAP level (cm/H2O) 20    EPAP level (cm/H2O) 8    Indications IMPROVE VENTILATION    Respiratory Rate: OTHER    OTHER 4.    PRN Bipap Allowed During Day?(only for qHS frequency) Yes    NasoGastric/OroGastric Tube (Full Face Only) WITHOUT       Patient on the above BiPAP settings continuous. Patient has moderate amount of secretions. Respiratory will continue to monitor.

## 2018-05-16 NOTE — Care Plan (Signed)
Patient with no respiratory issues at his time resting comfortably.  Started on a ATC again today.  Will rest him on the BiPAP again tonight.

## 2018-05-16 NOTE — Care Plan (Signed)
[facility]  Rehabilitation Services  Physical Therapy Progress Note      Patient Name: Antonio Gillespie  Date of Birth: 1960-09-09  Height:  172.7 cm (5\' 8" )  Weight:  85.5 kg (188 lb 7.9 oz)  Room/Bed: 02/A  Payor: OUTSOURCE PENDING MEDICAID / Plan: ELIGIBILITY PENDING MEDICAID / Product Type: Medicaid /     Assessment:     Mr. Antonio Gillespie demonstrates fair response to PT intervention this date. Pt is able to complete AROM in BLE x10 reps. Pt mouthing words throughout session but difficult to communicate/understanding. Continues to demonstrate decreased strength, endurance, functional activity tolerance, balance and coordination deficits, and requires assist for all functional tasks at this time. Pt continues to be appropriate for SNF upon medically appropriate d/c.     Discharge Needs:   Equipment Recommendation: TBD  Discharge Disposition: skilled nursing facility    JUSTIFICATION OF DISCHARGE RECOMMENDATION   Based on current diagnosis, functional performance prior to admission, and current functional performance, this patient requires continued PT services in skilled nursing facility in order to achieve significant functional improvements in these deficit areas: aerobic capacity/endurance, ergonomics and body mechanics, gait, locomotion, and balance, integumentary integrity, joint integrity and mobility, motor function, muscle performance, neuromotor development and sensory integration, neuromuscular, posture, ROM (range of motion), ventilation and respiration/gas exchange.      Plan:   Continue to follow patient according to established plan of care.  The risks/benefits of therapy have been discussed with the patient/caregiver and he/she is in agreement with the established plan of care.     Subjective & Objective:        05/16/18 1032   Therapist Pager   PT Assigned/ Pager # Luther Parody [phone]   Rehab Session   Document Type therapy progress note (daily note)   Total PT Minutes: 16   Patient Effort adequate      General Information   Patient Profile Reviewed yes   Medical Lines PIV Line;Telemetry;Foley Catheter   Respiratory Status bipap   Existing Precautions/Restrictions fall precautions;full code;oxygen therapy device and L/min   Mutuality/Individual Preferences   Anxieties, Fears or Concerns None noted.    Individualized Care Needs OOB with maximove.    Patient-Specific Goals (Include Timeframe) Improve mobility.    Pre Treatment Status   Pre Treatment Patient Status Patient supine in bed;Call light within reach;Telephone within reach;Nurse approved session   Support Present Pre Treatment  None   Communication Pre Treatment  Nurse   Communication Pre Treatment Comment RN medically cleared pt for participation in therapy intervention.    Cognitive Assessment/Interventions   Behavior/Mood Observations alert   Orientation Status oriented to;person   Attention severe impairment;needs re-direction;difficulty attending to task/directions;distractible;difficult dividing attention   Follows Commands follows one step commands;increased processing time needed;initiation impaired;delayed response/completion;physical/tactile prompts required;repetition of directions required   Comment Unable to effectively communicate throughout session.    Vital Signs   O2 Delivery Pre Treatment supplemental O2   O2 Delivery Post Treatment supplemental O2   Pain Assessment   Pre/Posttreatment Pain Comment Denies pain throughout session.    Therapeutic Exercise/Activity   Comment AROM to BLE x10.    Lower Extremity ankle pumps;SLR (straight leg raise), bilateral   Post Treatment Status   Post Treatment Patient Status Patient supine in bed;Call light within reach;Telephone within reach;Media planner Post Treatment Comment Pt performance.    Plan of Care Review  Plan Of Care Reviewed With patient   Basic Mobility Am-PAC/6Clicks Score   Turning in  bed without bedrails 2   Lying on back to sitting on edge of flat bed 2   Moving to and from a bed to a chair 1   Standing up from chair 1   Walk in room 1   Climbing 3-5 steps with railing 1   6 Clicks Raw Score total 8   Standardized (t-scale) score 22.61   CMS 0-100% Score 85.35   CMS Modifier CM   Patient Mobility Goal (JHHLM) 2- Perform PROM 2X/day   Exercise/Activity Level Performed 2- Turned self in bed/ROM (active or passive)   Physical Therapy Clinical Impression   Assessment Mr. Antonio Gillespie demonstrates fair response to PT intervention this date. Pt is able to complete AROM in BLE x10 reps. Pt mouthing words throughout session but difficult to communicate/understanding. Continues to demonstrate decreased strength, endurance, functional activity tolerance, balance and coordination deficits, and requires assist for all functional tasks at this time. Pt continues to be appropriate for SNF upon medically appropriate d/c.    Anticipated Equipment Needs at Discharge (PT) TBD   Anticipated Discharge Disposition skilled nursing facility       Therapist:   Hyacinth Meeker, PT, DPT  Pager #: [phone]

## 2018-05-16 NOTE — Care Plan (Signed)
[facility]  Rehabilitation Services  Occupational Therapy Progress Note    Patient Name: Antonio Gillespie  Date of Birth: 02-24-1960  Height:  172.7 cm (5\' 8" )  Weight:  85.5 kg (188 lb 7.9 oz)  Room/Bed: 02/A  Payor: OUTSOURCE PENDING MEDICAID / Plan: ELIGIBILITY PENDING MEDICAID / Product Type: Medicaid /     Assessment:    Patient with fair tolerance. Limited this date due to tube feed leaking. RN unable to assist patient and therapists at time of session. Anticipate SNF. Anticipate slwoed progression due to prolonged immobility.       Discharge Needs:   Equipment Recommendation: to be determined    Discharge Disposition:  skilled nursing facility     JUSTIFICATION OF DISCHARGE RECOMMENDATION   Based on current diagnosis, functional performance prior to admission, and current functional performance, this patient requires continued OT services in skilled nursing facility in order to achieve significant functional improvements.    Plan:   Continue to follow patient according to established plan of care.  The risks/benefits of therapy have been discussed with the patient/caregiver and he/she is in agreement with the established plan of care.     Subjective & Objective:        05/16/18 1033   Therapist Pager   OT Assigned/ Pager # [phone]   Rehab Session   Document Type therapy progress note (daily note)   Total OT Minutes: 16   Patient Effort adequate   General Information   General Observations of Patient patient supine in bed   Medical Lines PIV Line;Telemetry;Foley Catheter   Respiratory Status bipap   Existing Precautions/Restrictions fall precautions;full code;oxygen therapy device and L/min   Mutuality/Individual Preferences   Individualized Care Needs maxi move   Cognitive Assessment/Interventions   Behavior/Mood Observations alert   Orientation Status oriented to;person   Attention severe impairment;needs cues to re-direct;difficult dividing attention;distractible;difficulty attending to  task/directions;needs re-direction   Follows Commands follows one step commands;verbal cues/prompting required   Therapeutic Exercise/Activity   Comment Provided PROM to bilateral UE's. Provided adequate positioning of UE's, head, and neck.    Post Treatment Status   Post Treatment Patient Status Patient supine in bed;Venodynes in place and activated;Call light within reach;Telephone within reach;Media planner Post Treatment Comment Pt performance.    Clinical Impression   Functional Level at Time of Session Patient with fair tolerance. Limited this date due to tube feed leaking. RN unable to assist patient and therapists at time of session. Anticipate SNF. Anticipate slwoed progression due to prolonged immobility.    Anticipated Equipment Needs at Discharge to be determined   Anticipated Discharge Disposition skilled nursing facility       Therapist:   Jaquelyn Bitter, OT  Pager #: [phone]

## 2018-05-17 ENCOUNTER — Encounter (HOSPITAL_BASED_OUTPATIENT_CLINIC_OR_DEPARTMENT_OTHER): Payer: Self-pay | Admitting: ORTHOPEDIC, SPORTS MEDICINE

## 2018-05-17 ENCOUNTER — Inpatient Hospital Stay (HOSPITAL_COMMUNITY): Payer: Medicaid Other | Admitting: Radiology

## 2018-05-17 ENCOUNTER — Inpatient Hospital Stay (HOSPITAL_COMMUNITY): Payer: Medicaid Other

## 2018-05-17 DIAGNOSIS — R063 Periodic breathing: Secondary | ICD-10-CM

## 2018-05-17 DIAGNOSIS — R140 Abdominal distension (gaseous): Secondary | ICD-10-CM

## 2018-05-17 DIAGNOSIS — K562 Volvulus: Secondary | ICD-10-CM

## 2018-05-17 LAB — BPAM PACKED CELL ORDER
UNIT DIVISION: 0
UNIT DIVISION: 0
UNIT DIVISION: 0

## 2018-05-17 LAB — BASIC METABOLIC PANEL
ANION GAP: 6 mmol/L (ref 4–13)
BUN/CREA RATIO: 12 (ref 6–22)
BUN: 6 mg/dL — ABNORMAL LOW (ref 8–25)
CALCIUM: 7.5 mg/dL — ABNORMAL LOW (ref 8.5–10.2)
CHLORIDE: 106 mmol/L (ref 96–111)
CO2 TOTAL: 31 mmol/L (ref 22–32)
CREATININE: 0.51 mg/dL — ABNORMAL LOW (ref 0.62–1.27)
ESTIMATED GFR: >60 mL/min/1.73mˆ2 (ref >60)
GLUCOSE: 115 mg/dL (ref 65–139)
POTASSIUM: 3.2 mmol/L — ABNORMAL LOW (ref 3.5–5.1)
SODIUM: 143 mmol/L (ref 136–145)

## 2018-05-17 LAB — PHOSPHORUS: PHOSPHORUS: 3.7 mg/dL (ref 2.4–4.7)

## 2018-05-17 LAB — CBC
HCT: 25.4 % — ABNORMAL LOW (ref 38.9–52.0)
HGB: 7.4 g/dL — ABNORMAL LOW (ref 13.4–17.5)
MCH: 26.9 pg (ref 26.0–32.0)
MCHC: 29.1 g/dL — ABNORMAL LOW (ref 31.0–35.5)
MCV: 92.4 fL (ref 78.0–100.0)
MPV: 11.3 fL (ref 8.7–12.5)
PLATELETS: 433 x10ˆ3/uL — ABNORMAL HIGH (ref 150–400)
RBC: 2.75 x10?6/uL — ABNORMAL LOW (ref 4.50–6.10)
RBC: 2.75 x10ˆ6/uL — ABNORMAL LOW (ref 4.50–6.10)
RDW-CV: 18 % — ABNORMAL HIGH (ref 11.5–15.5)
WBC: 6.5 x10ˆ3/uL (ref 3.7–11.0)

## 2018-05-17 LAB — POC BLOOD GLUCOSE (RESULTS): GLUCOSE, POC: 119 mg/dL — ABNORMAL HIGH (ref 70–105)

## 2018-05-17 LAB — MAGNESIUM: MAGNESIUM: 1.9 mg/dL (ref 1.6–2.6)

## 2018-05-17 LAB — TYPE AND CROSS RED CELLS - UNITS
ABO/RH(D): O NEG
ANTIBODY SCREEN: NEGATIVE
UNITS ORDERED: 3

## 2018-05-17 LAB — PROCALCITONIN: PROCALCITONIN: 0.08 ng/mL (ref <0.50)

## 2018-05-17 MED ORDER — CHLORHEXIDINE GLUCONATE 0.12 % MOUTHWASH
15.00 mL | MOUTHWASH | Freq: Three times a day (TID) | Status: DC
Start: 2018-05-17 — End: 2018-05-19
  Administered 2018-05-17 – 2018-05-19 (×7): 15 mL via ORAL
  Filled 2018-05-17 (×7): qty 15

## 2018-05-17 MED ORDER — POTASSIUM BICARBONATE-CITRIC ACID 20 MEQ EFFERVESCENT TABLET
40.0000 meq | EFFERVESCENT_TABLET | ORAL | Status: AC
Start: 2018-05-17 — End: 2018-05-17
  Administered 2018-05-17: 40 meq via GASTROSTOMY
  Filled 2018-05-17: qty 2

## 2018-05-17 MED ORDER — SENNA LEAF EXTRACT 176 MG/5 ML ORAL SYRUP
10.0000 mL | ORAL_SOLUTION | Freq: Two times a day (BID) | ORAL | Status: DC
Start: 2018-05-17 — End: 2018-05-19
  Administered 2018-05-17 – 2018-05-18 (×3): 352 mg via GASTROSTOMY
  Administered 2018-05-18 – 2018-05-19 (×2): 0 mg via GASTROSTOMY
  Filled 2018-05-17 (×4): qty 15

## 2018-05-17 MED ORDER — THIAMINE HCL (VITAMIN B1) 100 MG TABLET
100.0000 mg | ORAL_TABLET | Freq: Every day | ORAL | Status: DC
Start: 2018-05-17 — End: 2018-05-17

## 2018-05-17 MED ORDER — GUAIFENESIN 100 MG/5 ML ORAL LIQUID
10.0000 mL | Freq: Four times a day (QID) | ORAL | Status: DC
Start: 2018-05-17 — End: 2018-05-25
  Administered 2018-05-17 – 2018-05-25 (×32): 10 mL via GASTROSTOMY
  Filled 2018-05-17 (×36): qty 10

## 2018-05-17 MED ADMIN — senna leaf extract 176 mg/5 mL oral syrup: GASTROSTOMY | @ 12:00:00

## 2018-05-17 MED ADMIN — aspirin 81 mg chewable tablet: GASTROSTOMY | @ 10:00:00

## 2018-05-17 MED ADMIN — magnesium oxide 400 mg (241.3 mg magnesium) tablet: GASTROSTOMY | @ 10:00:00

## 2018-05-17 MED ADMIN — sodium chloride 0.9 % (flush) injection syringe: @ 14:00:00

## 2018-05-17 MED ADMIN — chlorhexidine gluconate 0.12 % mouthwash: ORAL | @ 22:00:00

## 2018-05-17 MED ADMIN — sodium chloride 0.9 % (flush) injection syringe: @ 07:00:00

## 2018-05-17 MED ADMIN — Lactobacillus rhamnosus GG 15 billion cell sprinkle capsule: GASTROSTOMY | @ 17:00:00

## 2018-05-17 MED ADMIN — metoprolol tartrate 25 mg tablet: GASTROSTOMY | @ 22:00:00

## 2018-05-17 MED ADMIN — diphenhydrAMINE 25 mg capsule: SUBCUTANEOUS | @ 22:00:00

## 2018-05-17 MED ADMIN — ondansetron HCl (PF) 4 mg/2 mL injection solution: GASTROSTOMY | @ 10:00:00

## 2018-05-17 MED ADMIN — sodium chloride 0.9 % (flush) injection syringe: GASTROSTOMY | @ 09:00:00

## 2018-05-17 MED ADMIN — SODIUM CHLORIDE 0.45 % W/ ADDITIVES: GASTROSTOMY | @ 10:00:00

## 2018-05-17 MED ADMIN — PARoxetine 10 mg tablet: GASTROSTOMY | @ 22:00:00

## 2018-05-17 MED ADMIN — aspirin 81 mg tablet,delayed release: @ 22:00:00

## 2018-05-17 MED ADMIN — sodium chloride 0.9 % (flush) injection syringe: GASTROSTOMY | @ 22:00:00

## 2018-05-17 MED ADMIN — sodium chloride 0.9 % (flush) injection syringe: TOPICAL | @ 10:00:00

## 2018-05-17 MED ADMIN — lactated Ringers intravenous solution: GASTROSTOMY | @ 10:00:00

## 2018-05-17 MED ADMIN — sodium chloride 0.9 % intravenous solution: GASTROSTOMY | @ 22:00:00

## 2018-05-17 NOTE — Care Management Notes (Signed)
[facility]  Care Management Note    Patient Name: Antonio Gillespie  Date of Birth: 02/03/1961  Sex: male  Date/Time of Admission: 04/28/2018  3:32 PM  Room/Bed: 02/A  Payor: OUTSOURCE PENDING MEDICAID / Plan: ELIGIBILITY PENDING MEDICAID / Product Type: Medicaid /    LOS: 19 days   Primary Care Providers:  Selinda Eon, MD, MD (General)    Admitting Diagnosis:  Scalp hematoma [S00.03XA]    Assessment:      05/17/18 1145   Assessment Details   Assessment Type Continued Assessment   Date of Care Management Update 05/17/18   Date of Next DCP Update 05/19/18   Care Management Plan   Discharge Planning Status plan in progress   Projected Discharge Date 05/22/18   Discharge Needs Assessment   Discharge Facility/Level of Care Needs SNF Placement (Medicare certified)(code 3)     Pt's brother/HCS is requesting that Pt complete an MPOA. MSW was informed by bedside RN that Pt is not in a position to designate an MPOA at this time due to not being able to communicate either verbally or through writing.   MSW contacted HCS [name] and explained it is not appropriate for Pt to assist Pt with directives at this time. [name] requested a copy of HCS document. MSW mailed HCS on chart to [place] at [address] [place] [address].     Discharge Plan:  SNF Placement (Medicare certified) (code 3)    The patient will continue to be evaluated for developing discharge needs.     Case Manager: Placido Sou, SOCIAL WORKER  Phone: [phone]

## 2018-05-17 NOTE — Care Plan (Addendum)
Antonio Gillespie  [facility]  Medical Nutrition Therapy Follow Up    SUBJECTIVE: Pt oriented only to self. Abdomen was distended overnight with KUB ordered. ATC trials during the day and BiPAP at night with copious amounts of secretions. Tolerating TFs at 60 ml/hr. Replacing K+. On culturelle; BM x 3 so far today.     OBJECTIVE:     Current Diet Order/Nutrition Support:  MNT PROTOCOL FOR DIETITIAN  DIET NPO - NOW  ADULT TUBE FEEDING - CONTINUOUS DRIP CONTINUOUS NO MEALS, TF ONLY; OSMOLITE 1.5; OG; Initial Rate (ml/hr): 10; Increase by: 10cc Q4 hours to goal; Goal Rate (ml/hr): 60 + 2 packets ProSource  Provides: 2280 kcal, 120 g protein, 1097 ml free water    Height Used for Calculations: 172.7 cm (5' 7.99")  Weight Used For Calculations: 87.5 kg (192 lb 14.4 oz)  BMI (kg/m2): 29.4  BMI Assessment: BMI 25-29.9: overweight  Ideal Body Weight (IBW) (kg): 70.87  % Ideal Body Weight: 123.47   Last Weight: 87.1 kg (192 lb 0.3 oz) (4/1 bed)          Re-assessed needs if applicable:  Energy Calorie Requirements: 2450-2600 cal per day (28-30 kcals/87.5 kg BW)  Protein Requirements (gms/day): 85-106 g prot per day (1.2-1.5 g/70.8 kg IBW)    If pt to be placed back on vent support:   Energy Calorie Requirements: 2200-2600 cal per day (25-30kcals/87.5kg)  Protein Requirements (gms/day): 106-142 g prot per day (1.5-2g/70.8kg)      Comments: history of atrial fibrillation, COPD, EtOH abuse who was originally found down covered in urine/feces.  Patient underwent trachestomy and PEG tube placement on 05/03/2018 and was transferred to step down on 3/25.  However, patient aspirated while taking tube feeds and was transferred to SICU due to concerns for aspiration. ATC trials during day, BiPAP at night or for increased work of breathing      Intake/Output Summary (Last 24 hours) at 05/17/2018 1053  Last data filed at 05/17/2018 0700  Gross per 24 hour   Intake 2620 ml   Output --   Net 2620 ml       Plan/Interventions :   1. Will  adjust TF to better meet pt needs now that he is no longer requiring vent support:  Tube Feed Formula : Osmolite 1.5 - d/c ProSource packets   Goal Rate: 70 ml/hr x 24 hrs  Provides: 2520 cal = 29 cals/kg                104 g protein = 1.5 g/kg                1280 ml free water  2. Monitor TF tolerance daily - please keep HOB elevated to prevent aspiration   3. Daily I/O monitoring to assess hydration status - add free water flushes as needed. Last Na 143  4. Continue to monitor BMP and replace electrolytes accordingly - last K 3.2, Mg and Phos have also been dropping and needing replacements - pt with h/o EtOH abuse and probable refeeding risk  5. Continue daily prenatal vitamin to help correct micronutrient deficiencies. Will also order 100 mg thiamin x 5 days to aid in CHO metabolism, especially given EtOH hx  6. Monitor bowel habits given distended abdomen and adjust bowel regimen as needed - currently only receiving senna; KUB showed borderline dilation of the small and large bowel loops, increased since  05/12/2018, can reflect developing bowel obstruction  7. Once pt more awake and  alert, can transition to bolus feeds (aspirated on continuous TFs) - 1 can at 5A, 8A, 11A, 2P, 5P, 8P, 11P (7 cans total)  8. Daily weights to monitor weight trends and ensure appropriate nutrition interventions are in place  Will monitor progress and make further recommendations prn.       Nutrition Diagnosis: Swallowing difficulty related to decreased mental status as evidenced by Need for TF (in progress)      Blima Dessert, RDLD  05/17/2018, 10:38  Pager # [phone]

## 2018-05-17 NOTE — Care Plan (Signed)
Patients BiPAP settings changed to meet his opening pressures- Patient is now on BiPAP 12/8 and 50%  Patient tolerated a 1 hour atc trial with speech and swallow eval and placement of speaking valve.  Will attempt another short atc trial this afternoon if tolerated ,and return to BiPAP to rest.

## 2018-05-17 NOTE — Progress Notes (Signed)
Family Phone call    The designated family member Janyth Contes) was able to be reached by phone for updates.  An update of the patient's status was reviewed and all questions were answered.     Patient's brother had again requested a copy of surrogate paperwork be mailed to him.  He was also hoping that he could officially be designated MPOA.  Care Manger notified of this and will work on requests.    Lysle Rubens, D.O. 05/17/2018 10:35  [facility]  Department of Anesthesiology, CA1/PGY2

## 2018-05-17 NOTE — Progress Notes (Signed)
Family Phone call    The designated family member was able to be reached by phone for updates.  An update of the patient's status was reviewed and all questions were answered.    Toya Smothers, MD  05/17/2018, 15:53

## 2018-05-17 NOTE — Care Plan (Signed)
[facility]  Rehabilitation Services  Physical Therapy Progress Note      Patient Name: Antonio Gillespie  Date of Birth: 05-10-1960  Height:  172.7 cm (5\' 8" )  Weight:  87.1 kg (192 lb 0.3 oz)  Room/Bed: 02/A  Payor: [employer]SOURCE PENDING MEDICAID / Plan: ELIGIBILITY PENDING MEDICAID / Product Type: Medicaid /     Assessment:     Mr. Mcilwain demonstrates good response to PT intervention this date. Pt able to complete supine <> sit and STS x2 trials with maxAx2 and sara stedy. Pt requires verbal and tactile cues throughout to complete tasks. Pt continues to attempt to communicate throughout session but unable to verablize/write. Continues to be most appropriate for SNF placement to address functional deficits and participation restrictions. Will continue to follow.     Discharge Needs:   Equipment Recommendation: TBD  Discharge Disposition: skilled nursing facility    JUSTIFICATION OF DISCHARGE RECOMMENDATION   Based on current diagnosis, functional performance prior to admission, and current functional performance, this patient requires continued PT services in skilled nursing facility in order to achieve significant functional improvements in these deficit areas: aerobic capacity/endurance, ergonomics and body mechanics, gait, locomotion, and balance, integumentary integrity, joint integrity and mobility, motor function, muscle performance, neuromotor development and sensory integration, neuromuscular, posture, ROM (range of motion), ventilation and respiration/gas exchange.      Plan:   Continue to follow patient according to established plan of care.  The risks/benefits of therapy have been discussed with the patient/caregiver and he/she is in agreement with the established plan of care.     Subjective & Objective:        05/17/18 1140   Therapist Pager   PT Assigned/ Pager # Luther Parody [phone]   Rehab Session   Document Type therapy progress note (daily note)   Total PT Minutes: 23   Patient Effort good   General  Information   Patient Profile Reviewed yes   Medical Lines PIV Line;Telemetry   Respiratory Status bipap   Existing Precautions/Restrictions fall precautions;full code;oxygen therapy device and L/min   Mutuality/Individual Preferences   Anxieties, Fears or Concerns None noted.    Individualized Care Needs OOB with maximove.    Patient-Specific Goals (Include Timeframe) Improve mobility.    Pre Treatment Status   Pre Treatment Patient Status Patient supine in bed;Call light within reach;Telephone within reach;Nurse approved session   Support Present Pre Treatment  None   Communication Pre Treatment  Nurse   Communication Pre Treatment Comment RN medically cleared pt for participation in therapy intervention.    Cognitive Assessment/Interventions   Behavior/Mood Observations alert;cooperative   Orientation Status oriented to;person   Attention mild impairment;difficulty attending to task/directions;distractible;needs cues to re-direct;difficult dividing attention   Follows Commands follows one step commands;delayed response/completion;increased processing time needed;initiation impaired;physical/tactile prompts required;repetition of directions required   Vital Signs   O2 Delivery Pre Treatment supplemental O2   O2 Delivery Post Treatment supplemental O2   Pain Assessment   Pre/Posttreatment Pain Comment Denied pain throughout.    Bed Mobility Assessment/Treatment   Bed Mobility, Assistive Device Head of Bed Elevated;draw sheet   Supine-Sit Independence maximum assist (25% patient effort);2 person assist required;verbal cues required   Sit to Supine, Independence maximum assist (25% patient effort);2 person assist required;verbal cues required   Safety Issues decreased use of arms for pushing/pulling;decreased use of legs for bridging/pushing;impaired trunk control for bed mobility   Impairments balance impaired;coordination impaired;endurance;flexibility decreased;postural control impaired;ROM decreased;strength  decreased   Scoot/Bridge  Independence maximum assist (25% patient effort);2 person assist required;verbal cues required   Transfer Assessment/Treatment   Sit-Stand Independence maximum assist (25% patient effort);2 person assist required;verbal cues required   Stand-Sit Independence maximum assist (25% patient effort);2 person assist required;verbal cues required   Sit-Stand-Sit, Assist Device Corene Cornea   Transfer Safety Issues balance decreased during turns;weight-shifting ability decreased;step length decreased;sequencing ability decreased   Transfer Impairments balance impaired;coordination impaired;endurance;flexibility decreased;postural control impaired;ROM decreased;strength decreased;motor control impaired   Balance Skill Training   Comment with sara stedy   Sitting Balance: Static fair balance   Sitting, Dynamic (Balance) fair balance   Sit-to-Stand Balance fair - balance   Standing Balance: Static poor + balance   Standing Balance: Dynamic unable to balance   Systems Impairment Contributing to Balance Disturbance musculoskeletal;cognitive;neuromuscular;somatosensory   Identified Impairments Contributing to Balance Disturbance impaired coordination;abnormal muscle tone;impaired motor control;impaired postural control;decreased ROM;decreased strength;other (see comments)   Post Treatment Status   Post Treatment Patient Status Patient supine in bed;Call light within reach;Telephone within reach;Media planner Post Treatment Comment Pt performance.    Plan of Care Review   Plan Of Care Reviewed With patient   Basic Mobility Am-PAC/6Clicks Score   Turning in bed without bedrails 2   Lying on back to sitting on edge of flat bed 2   Moving to and from a bed to a chair 1   Standing up from chair 2   Walk in room 1   Climbing 3-5 steps with railing 1   6 Clicks Raw Score total 9   Standardized (t-scale) score 25.8    CMS 0-100% Score 77.59   CMS Modifier CL   Patient Mobility Goal (JHHLM) 5- Stand 1 minute or more 3X/day   Exercise/Activity Level Performed 5- Static standing>1 minute   Physical Therapy Clinical Impression   Assessment Mr. Schellin demonstrates good response to PT intervention this date. Pt able to complete supine <> sit and STS x2 trials with maxAx2 and sara stedy. Pt requires verbal and tactile cues throughout to complete tasks. Pt continues to attempt to communicate throughout session but unable to verablize/write. Continues to be most appropriate for SNF placement to address functional deficits and participation restrictions. Will continue to follow.    Patient/Family Goals Statement To get better.    Anticipated Equipment Needs at Discharge (PT) TBD   Anticipated Discharge Disposition skilled nursing facility       Therapist:   Hyacinth Meeker, PT, DPT   Pager #: [phone]

## 2018-05-17 NOTE — Care Plan (Signed)
[facility]  Rehabilitation Services  Occupational Therapy Progress Note    Patient Name: Antonio Gillespie  Date of Birth: [birth year]  Height:  172.7 cm (5\' 8" )  Weight:  87.1 kg (192 lb 0.3 oz)  Room/Bed: 02/A  Payor: [employer] PENDING MEDICAID / Plan: ELIGIBILITY PENDING MEDICAID / Product Type: Medicaid /     Assessment:    Pt tolerated OT treatment well. Pt making progress with activity tolerance, and functional mobility.  Pt unsafe to return home and will require SNF for continued therapy prior to return home.       Discharge Needs:   Equipment Recommendation: to be determined    Discharge Disposition:  skilled nursing facility     JUSTIFICATION OF DISCHARGE RECOMMENDATION   Based on current diagnosis, functional performance prior to admission, and current functional performance, this patient requires continued OT services in skilled nursing facility in order to achieve significant functional improvements.    Plan:   Continue to follow patient according to established plan of care.  The risks/benefits of therapy have been discussed with the patient/caregiver and he/she is in agreement with the established plan of care.     Subjective & Objective:        05/17/18 1138   Therapist Pager   OT Assigned/ Pager # Alcario Drought [phone]   Rehab Session   Document Type therapy progress note (daily note)   Total OT Minutes: 25   Patient Effort good   General Information   Medical Lines PIV Line;Telemetry;Foley Catheter   Respiratory Status ventilator   Existing Precautions/Restrictions fall precautions;full code;oxygen therapy device and L/min   Pre Treatment Status   Pre Treatment Patient Status Patient supine in bed;Call light within reach;Telephone within reach   Support Present Pre Treatment  None   Communication Pre Treatment  Nurse   Communication Pre Treatment Comment Pt and nursing agreeable to OT treatment with professional assist of PT.    Mutuality/Individual Preferences   Individualized Care Needs Huntley Dec stedy  assist of 2 for transfers to chair.    Pain Assessment   Pretreatment Pain Rating 0/10 - no pain   Posttreatment Pain Rating 0/10 - no pain   Coping/Psychosocial Response Interventions   Plan Of Care Reviewed With patient   Cognitive Assessment/Interventions   Behavior/Mood Observations alert;cooperative   Attention WNL/WFL   Follows Commands follows one step commands   Bed Mobility Assessment/Treatment   Bed Mobility, Assistive Device Head of Bed Elevated   Supine-Sit Independence moderate assist (50% patient effort);2 person assist required   Sit to Supine, Independence maximum assist (25% patient effort);2 person assist required   Safety Issues decreased use of arms for pushing/pulling;decreased use of legs for bridging/pushing   Comment Pt able to balance self EOB with CGA- Close SPV.    Transfer Assessment/Treatment   Sit-Stand Independence moderate assist (50% patient effort);2 person assist required   Stand-Sit Independence moderate assist (50% patient effort);2 person assist required   Sit-Stand-Sit, Assist Device Corene Cornea   Transfer Impairments balance impaired;endurance;strength decreased;ROM decreased;postural control impaired;flexibility decreased;motor control impaired   Transfer Comment Pt completed two sit-stand transfers using the sara stedy and was able to tolerance static standing for ~2 mins.    ADL Assessment/Intervention   ADL Comments Dep for self care at this time.  Pt limited by B UE edema, decreased B UE ROM, decreased strength, decreased balance, decreased endurance.    Balance Skill Training   Sitting Balance: Static fair balance   Sitting, Dynamic (Balance)  fair balance   Sit-to-Stand Balance fair - balance   Standing Balance: Static poor + balance   Therapeutic Exercise/Activity   Comment B UE AAROM to increase ROM, strength, and decrease edema for increased functional use in self care and transfers.    Clinical Impression   Functional Level at Time of Session Pt tolerated OT treatment  well. Pt making progress with activity tolerance, and functional mobility.  Pt unsafe to return home and will require SNF for continued therapy prior to return home.    Anticipated Equipment Needs at Discharge to be determined   Anticipated Discharge Disposition skilled nursing facility       Therapist:   Janne Napoleon, OT  Pager #: [phone]

## 2018-05-17 NOTE — Nurses Notes (Signed)
Pt abdomen more distended than prior assessments. SICU notified and at bedside. Orders received. Tube feeds stopped.

## 2018-05-17 NOTE — Care Plan (Addendum)
[facility]  Speech Therapy Initial Speech/Language Evaluation    Patient Name: Antonio Gillespie  Date of Birth: 1960/07/31  Height: Height: 172.7 cm (_0 )  Weight: Weight: 87.1 kg (192 lb 0.3 oz)  Room/Bed: 02/A  Payor: OUTSOURCE PENDING MEDICAID / Plan: ELIGIBILITY PENDING MEDICAID / Product Type: Medicaid /     Assessment:  Functional Level At Time Of Evaluation: RT present-pt suctioned and placed from bipap to ATC and trach cuff deflated.  Voicing achieved with digital occlusion of trach.  Function and use of pmsv explained to pt. Valve placed and tolerated well.  Good voicing achieved.  Speech is often confused, illogical with fair overall intelligibility.  Pt oriented to self only.   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  Cognitive-linguistic deficits    Discharge Needs:  Discharge Disposition: skilled nursing facility (Will need justification of DC recommendation if placement is required.)    JUSTIFICATION OF DISCHARGE RECOMMENDATION  Based on current diagnosis, functional performance prior to admission, and current functional performance, this patient requires continued SLP services in skilled nursing facility in order to achieve significant functional improvements for Cognitive/Linguistic and Speech and Language.    Plan:  To provide Speech Therapy services 2-3 times/wk according to POC.    The risks and benefits of therapy have been discussed with the patient/caregiver and he/she is in agreement with the established plan of care.    Subjective & Objective:     05/17/18 1347   Therapist Pager   SLP Pager Safiya Girdler [phone]   Rehab Session   Total SLP Minutes: 35   Patient Effort good   General Information   General Observations of Patient Pt awake/alert.  RT present to place pt on ATC   Pertinent History of Current Functional Problem Antonio Gillespie is a 58 y.o. male with PMH of atrial fibrillation, COPD, EtOH abuse who was transferred from outside facility. Per  chart review, family of patient had not heard from him and called the police who found him in his home lying face down and covered in urine/feces. Patient was intubated at outside facility ED. Pan CT scan revealed a comminuted right humeral neck fracture, pulmonary artery enlargement, large left inguinal hernia. CT head showed bilateral scalp hematomas.   Mutuality/Individual Preferences   Anxieties, Fears or Concerns None stated   Individualized Care Needs Encourage pmsv placement   Patient-Specific Goals (Include Timeframe) None stated   Functional Status Prior   Prior Functional Level Comment Unsure of baseline   Coping/Psychosocial   Observed Emotional State calm;cooperative   Verbalized Emotional State   (None stated)   Plan of Care Reviewed With patient   Cognitive   Cognitive/Neuro/Behavioral WDL ex   Level of Consciousness alert   Arousal Level opens eyes spontaneously   Orientation disoriented to;place;time;situation   Speech trached;speaking valve   Pain Assessment   Pretreatment Pain Rating 0/10 - no pain   Posttreatment Pain Rating 0/10 - no pain   Speaking Valve   Voicing: WFL   Intelligibility Cues: slow rate;breath support   Oxygen Saturations: maintained   Wear Time:  16 - 30 minutes   Tolerated:  yes   Stoma Blast: no   Patient Able To:  place valve with assistance;remove valve with assistance   Precautions posted in room and discussed with:  patient;nurse   Trach Size 8;cuffed   Oxygenation   Oxygenation ATC   SLP Clinical Impression   SLP Diagnosis 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  Cognitive-linguistic deficits   Functional Level At Time Of Evaluation RT present-pt suctioned and placed from bipap to ATC and trach cuff deflated.  Function and use of pmsv explained to pt. Valve placed and tolerated well.  Good voicing achieved.  Speech is often confused, illogical with fair overall intelligibility.  Pt oriented to self only.    Criteria for Skilled Therapeutic Interventions Met (SLP  Eval) skilled criteria for speech language intervention met   Therapy Frequency (PT) 2-3 times/wk   Anticipated Discharge Disposition skilled nursing facility       Therapist:  Jafar Poffenberger, SLP   Pager #: [phone]  Phone #: [phone]

## 2018-05-17 NOTE — Progress Notes (Signed)
[facility]        SICU PROGRESS NOTE    Cortlin, Marano  Date of Admission:  04/28/2018  Date of Service: 05/17/2018  Date of Birth:  03-Mar-1960     Primary Attending: Harvin Hazel, Anderson Malta, *  Primary Service:  TRAUMA BLUE SIC   LOS: 19 days     This is a 58 y.o. male who was found down surrounded by urine and feces.  Is now s/p tracheostomy and PEG placement admitted to the SICU due to aspiration on tube feeds.    Subjective:  Abdomen was distended overnight.  KUB ordered.  Does not complain of any pain on palpation of the abdomen    Vital Signs:  Temp (24hrs) Max:37 C (88.5 F)      Systolic (02DXA), JOI:786 , Min:107 , VEH:209     Diastolic (47SJG), GEZ:66, Min:64, Max:91    Temp  Avg: 36.7 C (98 F)  Min: 36.4 C (97.5 F)  Max: 37 C (98.6 F)  MAP (Non-Invasive)  Avg: 95.3 mmHG  Min: 79 mmHG  Max: 104 mmHG  Pulse  Avg: 93.1  Min: 78  Max: 103  Resp  Avg: 24  Min: 17  Max: 33  SpO2  Avg: 98.3 %  Min: 92 %  Max: 100 %  Pain Score (Numeric, Faces): Other    Physical Exam:   General:  Chronically ill.  Appears older than stated age.  Eyes:  Conjunctiva clear. Pupils reactive bilaterally  HENT:  Trachea midline, tracheostomy in place.  Lungs:  Diminished breath sounds bilaterally.  Tracheostomy in place  Cardiovascular:  Irregular rhythm.  Abdomen:  Soft, no rebound or tenderness.  Mildly distended.  Extremities:  No gross deformities  Skin:  Skin warm and dry  Neurologic:  Follows commands.  GCS 4-6-1 (tracheostomy)    Labs:  I have reviewed all lab results.  Lab Results Today:    Results for orders placed or performed during the hospital encounter of 04/28/18 (from the past 24 hour(s))   IONIZED CALCIUM   Result Value Ref Range    PH (VENOUS) 7.41 7.31 - 7.41    IONIZED CALCIUM 1.16 1.10 - 1.35 mmol/L   VENOUS BLOOD GAS   Result Value Ref Range    %FIO2 (VENOUS) 50.0 %    BICARBONATE (VENOUS) 27.4 (H) 22.0 - 26.0 mmol/L    PCO2 (VENOUS) 55.00 (H) 41.00 - 51.00 mm/Hg    PH (VENOUS) 7.35 7.31  - 7.41    PO2 (VENOUS) 67.0 (H) 35.0 - 50.0 mm/Hg    BASE EXCESS 3.4 (H) -3.0 - 3.0 mmol/L   H & H   Result Value Ref Range    HGB 11.5 (L) 13.4 - 17.5 g/dL    HCT 39.3 38.9 - 52.0 %   POC BLOOD GLUCOSE (RESULTS)   Result Value Ref Range    GLUCOSE, POC 119 (H) 70 - 105 mg/dl   POC BLOOD GLUCOSE (RESULTS)   Result Value Ref Range    GLUCOSE, POC 129 (H) 70 - 105 mg/dl   CBC   Result Value Ref Range    WBC 6.5 3.7 - 11.0 x10^3/uL    RBC 2.75 (L) 4.50 - 6.10 x10^6/uL    HGB 7.4 (L) 13.4 - 17.5 g/dL    HCT 25.4 (L) 38.9 - 52.0 %    MCV 92.4 78.0 - 100.0 fL    MCH 26.9 26.0 - 32.0 pg    MCHC 29.1 (L) 31.0 -  35.5 g/dL    RDW-CV 18.0 (H) 11.5 - 15.5 %    PLATELETS 433 (H) 150 - 400 x10^3/uL    MPV 11.3 8.7 - 12.5 fL   BASIC METABOLIC PANEL   Result Value Ref Range    SODIUM 143 136 - 145 mmol/L    POTASSIUM 3.2 (L) 3.5 - 5.1 mmol/L    CHLORIDE 106 96 - 111 mmol/L    CO2 TOTAL 31 22 - 32 mmol/L    ANION GAP 6 4 - 13 mmol/L    CALCIUM 7.5 (L) 8.5 - 10.2 mg/dL    GLUCOSE 115 65 - 139 mg/dL    BUN 6 (L) 8 - 25 mg/dL    CREATININE 0.51 (L) 0.62 - 1.27 mg/dL    BUN/CREA RATIO 12 6 - 22    ESTIMATED GFR >60 >60 mL/min/1.65m2   MAGNESIUM   Result Value Ref Range    MAGNESIUM 1.9 1.6 - 2.6 mg/dL   PHOSPHORUS   Result Value Ref Range    PHOSPHORUS 3.7 2.4 - 4.7 mg/dL   POC BLOOD GLUCOSE (RESULTS)   Result Value Ref Range    GLUCOSE, POC 119 (H) 70 - 105 mg/dl       Recent Imaging:  Results for orders placed or performed during the hospital encounter of 04/28/18 (from the past 72 hour(s))   XR AP MOBILE CHEST     Status: None    Narrative    JJearld Lesch Male, 58years old.    XR AP MOBILE CHEST performed on 05/14/2018 8:54 AM.    REASON FOR EXAM:  Tachycardia    TECHNIQUE: 1 view/1 image(s) submitted for interpretation.    COMPARISON: May 13, 2018.    FINDINGS: Minimal atelectasis is present at the left lung base. No other  focal consolidation, pleural effusion, or pneumothorax is identified. Heart  size is  stable.    Tracheostomy tube remains in place.        Impression    1.  Minimal left basilar atelectasis.  2.  Cardiomegaly.         Assessment/ Plan:  Active Hospital Problems    Diagnosis   . Primary Problem: Fall   . Altered mental status, unspecified   . Atrial fibrillation (CMS HCC)   . Failure to wean from mechanical ventilation (CMS HCC)   . Anemia   . COPD (chronic obstructive pulmonary disease) (CMS HCC)   . Thrombocytopenia Unspecified   . Hypokalemia   . Alcohol abuse   . Acute respiratory failure (CMS HCC)   . Nasal fracture   . Abrasions of multiple sites   . Scalp hematoma   . Humeral fracture   . Left inguinal hernia   . Anasarca       JNIELS CRANSHAWis a 58y.o. male with a history of atrial fibrillation, COPD, EtOH abuse who was originally found down covered in urine/feces.  Patient underwent trachestomy and PEG tube placement on 05/03/2018 and was transferred to step down on 3/25.  However, patient aspirated while taking tube feeds and was transferred to SICU due to concerns for aspiration.    Neuro: GCS 4-6-1T  Mental status waxes and wanes.  Overall more alert.  Continue multimodal pain medication:    Gabapentin 4051mTID   Tylenol PRN  Known alcoholic: Scheduled ativan with taper completed.  Sleep: Melatonin      Resp:   Respiratory Rate: (!) 31  SpO2: 100 %  Recent Labs  05/16/18  0917   FI02 50.0   PH 7.41  7.35   PCO2 55.00*   PO2 67.0*   BICARBONATE 27.4*   BASEEXCESS 3.4*     Not on Ventilator - ATC trials during day, BiPAP at night or for increased work of breathing.  Tolerated ATC for about 3-4 hours  Copious amounts of secretions.  Decreased breath sounds bilaterally.  BAL 3/26 - No growth over 3 days  Nebs: duo nebs QID PRN  Scopolamine patch for secretions        CV:  SBP over last 24:  Systolic (40GQQ), PYP:950 , Min:107 , Max:143      Irregular rhythm  Hemodynamically acceptable. Cont to monitor  ASA 73m daily  Metoprolol 12.56mBID      GI:    Tube Feeds - Osmolite 1.5 @ 6099mr  (at goal)  Bowel Reg-continue lactobacillus  Last Bowel Movement: 05/17/18  GI PPX: Pepcid 99m86mD    Recent Labs     05/14/18  [id number] 1.6*       ENDO:  Glucose:  100s-120s   Diabetes A1C: 4.8  For hyperglycemia management: ISS:      Renal/FEN:     Intake/Output Summary (Last 24 hours) at 05/17/2018 0552  Last data filed at 05/17/2018 0500  Gross per 24 hour   Intake 3145 ml   Output --   Net 3145 ml     Recent Labs     05/15/18  1300 05/15/18  1301 05/15/18  1625 05/16/18  0105 05/16/18  0917 05/17/18  0044   SODIUM 138  --   --  136  --  143   POTASSIUM 3.2*  --   --  4.8  --  3.2*   CHLORIDE 103  --   --  111  --  106   BICARBONATE  --  24.0 22.3  --  27.4*  --    BUN 6*  --   --  5*  --  6*   CREATININE 0.54*  --   --  0.46*  --  0.51*   ANIONGAP 7  --   --  4  --  6   CALCIUM 7.6*  --   --  6.1*  --  7.5*   MAGNESIUM 2.2  --   --  1.6  --  1.9   PHOSPHORUS 4.9*  --   --  2.8  --  3.7     Uo: multiple unmeasured urine  IVFS: Plasmalyte _0 /hr   Lytes- Potassium 3.2 this AM, repleted  Foley- no      Heme:  Acute blood loss Anemia-asx  Ensure all blood draws are pedi tubes to min volume taken.  Hgb 7.4 this AM, stable.  Temperature: 36.5 C (97.7 F)  Recent Labs     05/16/18  0105 05/16/18  0917 05/17/18  0044   WBC 6.6  --  6.5   HGB 6.3* 11.5* 7.4*   HCT 22.0* 39.3 25.4*   PLTCNT 339  --  433*     Prophylaxis: SCDs, Lovenox    ID: Temp (12hrs), Avg:36.6 C (97.9 F), Min:36.5 C (97.7 F), Max:36.8 C (98.2 F)   Afebrile  Abx:   Augmentin completed 3/28  Procal - Normal  BAL 3/26 - No growth to date      Skin/Musculoskeletal:   Humeral fracture - nonoperative per orthopedics  Scattered abrasions  Continue local wound care    DVT PPX: SCDs, Lovenox  Therapies: PT/OT/Speech - recommend SNF on discharge  Activity OOB to chair TID.  NWB to upper extremity with fracture  Lines: PIV, Midline, G-Tube, Tracheostomy  Disposition: continue ICU.  SNF on discharge  Code Status: No escalation of care, No  vasopressors, No transfer back to ICU, DNR/DNI      Colin Benton, D.O. 05/17/2018 06:24  [facility]  Department of Anesthesiology, [place] Surgery Staff    I saw and examined the patient. I reviewed the residents note. I agree with the findings and plan of care as documented in the resident's note. Any exceptions/additions are edited/noted.  Neuro: GCS 11T  Prn tylenol for pain  Neurontin for LT alcohol management/relapse.  Sleep:melatonin    Resp:  Respiratory Rate: (!) 34  SpO2: 90 %  S/p trach- on BIPAP. 12/8 and 60%. EPAP increased with improvement. Cont to wean. ATC yesterday for approx 3 hours.  CXR pending.  Cont to wean as tol. Will try 1 hour of ATC and see how tolerates  Continue aggressive pulmonary hygiene/IS  scoplamine patch for secretions . Add guaifenesin  Touch base with speech for PMV  Repeat BAL today  Procal pending (procal 3/26 0.9)    CV:SBP over last 24: Systolic (31DVV), OHY:073 , Min:106 , Max:158  Afib with RVR resolved.  Hemodynamically acceptable. Cont to monitor  Metoprolol 12.5 BID for arrhythmias    GI: Diet:NPOTFs: @ goal  S/p PEG  Bowel Reg- resumeLast Bowel Movement: 05/17/18  Lactobacillus started(was on Augmentin previously)  GI PPX:DC with vent requirement going away  protein malnutrition- with protein supplementation     Renal/FEN:   XT:GGYI Monitor Uo,  IVFS:DC with goal TFs.  Lytes-Hypokalemia, replace K to prevent potential neuromuscular or  cardiac events, ileus, renal abnormalities or  glucose intolerance.    Heme:Ac Blood Loss Anemia-asx.Ensure all blood draws are pedi tubes to min volume taken.    RS:WNIO (12hrs), Avg:36.4 C (97.6 F), Min:36.4 C (97.5 F), Max:36.5 C (97.7 F)  Afebrile. No Abx    Skin/Musculoskeletal:  Extremities:humeral fx- nonop per ortho  Breakdown:scattered abrasions. No breakdown  continue local wound care  DVT EVO:JJKK,  Lovenox  Therapies: PT/OT/Speech  ActivityNWB to upper extremity with fracture  Disposition:cont ICU  Code Status:DNR. no escalation     Critical Care Attestation  Patient withAcute hypoxic/hyercarbic Respiratory insufficiency/Failure requiring frequent monitoring and assessment including including aggressive pulmonary hygiene.Continue to wean BIPAP with goal to ATC increasing    This patient suffers from failure or dysfunction of Neurologic/Sensory, Respiratory, Cardiovascular, GI/Hepatopancreaticobiliary, Infectious/Hematologic and Musculoskeletalsystem(s).     I was present at the bedside of this critically ill patient exclusive of procedures.The care of this patient was in regard to managing (a) conditions(s) that has a high probability of sudden, clinically significant, or life-threatening deterioration and required a high degree of Attending Physician attention and direct involvement to intervene urgently.   Data review and care planning was performed in direct proximity of the patient, examination was obviously performed in direct contact with the patient. All of this time was exclusive of procedure which will be documented elsewhere in the chart.    My critical care time is independent and unique to other providers (no other providers saw patient for purposes of SICU evaluation)  My critical care time involved full attention to the patients'condition and included:    Review of nursing notes and/or old charts  Review of medications, allergies, and vital signs  Documentation time  Consultant  collaboration on findings and treatment options  Care, transfer of care, and discharge plans  Ordering, interpreting, and reviewing diagnostic studies/tab tests  Obtaining necessary history from family, EMS, nursing home staff and/or treating physicians    My critical care time did not include time spent teaching resident physician(s) or other services of resident physicians, or performing other reported  procedures.    Total Critical Care Time:34mnutes    Electronically Signed by:    CTawana ScaleMD, MS  Assistant Professor of Surgery  Trauma, Surgical Critical Care, and [place] 05/17/2018 08:10

## 2018-05-17 NOTE — Care Plan (Signed)
References: [facility] - CPAP/BiPAP Adjustment Protocol    [facility] - Bi-Level Ventilation - Respironics BiPAP V60 Policy    [place] - Managing Routine or Acute Respiratory Failure with NPPV Algorithm   Question Answer Comment   Delivery Mode INVASIVE    FIO2 (%) 35    IPAP level (cm/H2O) 10    EPAP level (cm/H2O) 5    Indications IMPROVE VENTILATION    Respiratory Rate: OTHER    OTHER 4.    PRN Bipap Allowed During Day?(only for qHS frequency) Yes    NasoGastric/OroGastric Tube (Full Face Only)        Patient currently on above BiPAP setting. Moderate amount of secretions throughout the night. Patient has ATC when able to tolerate. Respiratory will continue to monitor.

## 2018-05-17 NOTE — Progress Notes (Signed)
[facility]                                            Trauma Progress Note                 Date of Birth:  [birth year]  Date of Admission:  04/28/2018  Date of service: 05/17/2018    Antonio Gillespie, 58 y.o., male Post trauma day 19 status post Fall    Events over the last 24 hours have included:  Hypoxic event overnight while on ATC BiPaP increased to Fio2 60%.     Subjective:    Resting in bed , pleasantly delirious.       Objective   24 Hour Summary:    Filed Vitals:    05/17/18 1300 05/17/18 1400 05/17/18 1500 05/17/18 1544   BP: (!) 121/100 94/63 98/74    Pulse: 95 (!) 104 100    Resp:       Temp: 37.2 C (99 F)      SpO2: 99%   100%     Labs:  Recent Labs     05/15/18  1300 05/15/18  1301 05/15/18  1625 05/16/18  0105 05/16/18  0917 05/17/18  0044   WBC 8.4  --   --  6.6  --  6.5   HGB 8.7*  --   --  6.3* 11.5* 7.4*   HCT 30.4*  --   --  22.0* 39.3 25.4*   SODIUM 138  --   --  136  --  143   POTASSIUM 3.2*  --   --  4.8  --  3.2*   CHLORIDE 103  --   --  111  --  106   BICARBONATE  --  24.0 22.3  --  27.4*  --    BUN 6*  --   --  5*  --  6*   CREATININE 0.54*  --   --  0.46*  --  0.51*   ANIONGAP 7  --   --  4  --  6   CALCIUM 7.6*  --   --  6.1*  --  7.5*   MAGNESIUM 2.2  --   --  1.6  --  1.9   PHOSPHORUS 4.9*  --   --  2.8  --  3.7       Intake/Output Summary (Last 24 hours) at 05/17/2018 1641  Last data filed at 05/17/2018 1500  Gross per 24 hour   Intake 2315 ml   Output --   Net 2315 ml     Nutrition Management: MNT PROTOCOL FOR DIETITIAN  DIET NPO - NOW  ADULT TUBE FEEDING - CONTINUOUS DRIP CONTINUOUS NO MEALS, TF ONLY; OSMOLITE 1.5; Gastrostomy; Initial Rate (ml/hr): 70; Goal Rate (ml/hr): 70 Last Bowel Movement: 05/17/18  No results for input(s): ALBUMIN, PREALBUMIN in the last 72 hours.  Current Medications:    Current Facility-Administered Medications:   .  acetaminophen (TYLENOL) 160 mg per 5 mL liquid - grape flavored, 650 mg, Gastric (NG, OG, PEG, GT), Q4H PRN, Verlon Au, MD, 650 mg at  05/15/18 2120  .  aspirin chewable tablet 81 mg, 81 mg, Gastric (NG, OG, PEG, GT), Daily, Verlon Au, MD, 81 mg at 05/17/18 0932  .  chlorhexidine gluconate (PERIDEX) 0.12% mouthwash, 15 mL, Swish & Spit, 3x/day, Odetta Pink, MD,  15 mL at 05/17/18 1358  .  enoxaparin PF (LOVENOX) 60 mg/0.6 mL SubQ injection, 0.5 mg/kg, Subcutaneous, Q12H, Verlon Au, MD, 50 mg at 05/17/18 0934  .  gabapentin (NEURONTIN) 363m per 628moral liquid, 400 mg, Gastric (NG, OG, PEG, GT), 3x/day, BoVerlon AuMD, 400 mg at 05/17/18 1358  .  guaiFENesin (ROBITUSSIN) 10025mer 5mL43mal liquid - for cough (expectorant), 10 mL, Gastric (NG, OG, PEG, GT), Q6H, Kotecki, Ryan, DO, 10 mL at 05/17/18 1206  .  ipratropium-albuterol 0.5 mg-3 mg(2.5 mg base)/3 mL Solution for Nebulization, 3 mL, Nebulization, 4x/day PRN, BootVerlon Au, 3 mL at 05/13/18 0407  .  lactobacillus rhamnosus (CULTURELLE) active cultures capsule, 1 Cap, Gastric (NG, OG, PEG, GT), 2x/day-Food, [place] Old, 1 Cap at 05/17/18 [phone] lanolin-oxyquin-pet, hydrophil (BAG BALM) topical ointment, , Apply Topically, 2x/day PRN, BootVerlon Au  .  magnesium oxide (MAG-OX) tablet, 400 mg, Gastric (NG, OG, PEG, GT), 2x/day, SpraDimple Nanas, 400 mg at 05/17/18 0933  .  melatonin tablet, 6 mg, Gastric (NG, OG, PEG, GT), NIGHTLY, BootVerlon Au, 6 mg at 05/16/18 2203  .  metoprolol tartrate (LOPRESSOR) tablet, 12.5 mg, Gastric (NG, OG, PEG, GT), 2x/day, [place] Old, 12.5 mg at 05/17/18 0933  .  INSERT & MAINTAIN PERIPHERAL IV ACCESS, , , UNTIL DISCONTINUED **AND** DRESSING CHANGE Saline Lock; DRY STERILE, , , PRN **AND** NS flush syringe, 2 mL, Intracatheter, Q8HRS, 2 mL at 05/17/18 1359 **AND** NS flush syringe, 2-6 mL, Intracatheter, Q1 MIN PRN, BootVerlon Au, 2 mL at 05/17/18 1358  .  NS flush syringe, 10-30 mL, Intracatheter, Q8HRS, KnigHarvin HazelnnAnderson Malta, Stopped at 05/17/18 1400  .  NS flush syringe, 20-30 mL, Intracatheter, Q1  MIN PRN, KnigHarvin HazelnnAnderson Malta  .  nystatin (NYSTOP) 100,000 units/g topical powder, , Apply Topically, 2x/day, BootVerlon Au  .  prenatal vitamin-iron-folic acid tablet, 1 Tab, Gastric (NG, OG, PEG, GT), Daily, BootVerlon Au, 1 Tab at 05/17/18 0900  .  scopolamine 1 mg over 3 days transdermal patch, 1 Patch, Transdermal, Q72H, [place] Old, 1 Patch at 05/15/18 0226  .  senna concentrate (SENNA) 528mg35m 15mL 48m liquid, 10 mL, Gastric (NG, OG, PEG, GT), 2x/day, Kotecki, Ryan, DO, 352 mg at 05/17/18 1206    Today's Physical Exam:  GEN:   NAD and Appears chronically Ill  NECK:   Trach in place,  PULM:   on BiPap  CV:   Regular rate and rhythm  ABD:   Distended, PEG in place.  NEURO:  Awakens to stimuli.   Integumentary:  Pink, warm, and dry. Scattered dry wounds over lower extremities and left upper lip    Assessment/ Plan:   Active Hospital Problems   (*Primary Problem)    Diagnosis   . *Fall     "found down" and taken to outside hospital     . Altered mental status, unspecified   . Atrial fibrillation (CMS HCC)   . Failure to wean from mechanical ventilation (CMS HCC)   . Anemia   . COPD (chronic obstructive pulmonary disease) (CMS HCC)     Chronic   . Thrombocytopenia Unspecified   . Hypokalemia   . Alcohol abuse   . Acute respiratory failure (CMS HCC)   . Nasal fracture     Acute vs chronic     . Abrasions of multiple sites   . Scalp hematoma   . Humeral fracture  nonop per ortho     . Left inguinal hernia     S/p reduction     . Anasarca     DVT prophylaxis:  Enoxaparin and SCDs/ Venodynes/Impulse boots  Anticoagulants (last 24 hours)     Date/Time Action Medication Dose    05/10/18 2106 Given    enoxaparin PF (LOVENOX) 60 mg/0.6 mL SubQ injection 50 mg    05/10/18 0826 Given    enoxaparin PF (LOVENOX) 60 mg/0.6 mL SubQ injection 50 mg        Nutrition: MNT PROTOCOL FOR DIETITIAN  DIET NPO - NOW  ADULT TUBE FEEDING - CONTINUOUS DRIP CONTINUOUS NO MEALS, TF ONLY; OSMOLITE 1.5;  Gastrostomy; Initial Rate (ml/hr): 70; Goal Rate (ml/hr): 70 diet, Last Bowel Movement: 05/17/18  Activity: OOB to chair   RUE nonweight bearing  Pain:   Analgesics (last 24 hours)     Date/Time Action Medication Dose    05/11/18 0511 Given    acetaminophen (TYLENOL) 160 mg per 5 mL liquid - grape flavored 650 mg    05/11/18 0044 Given    acetaminophen (TYLENOL) 160 mg per 5 mL liquid - grape flavored 650 mg        PT Recommendations: skilled nursing facility    Pneumonia   - BAL 3/26 - PMNs, no organisms    R Humerus Fracture   - Ortho Consult   - Non-op and NWB    KUB with air throughout the colon, mild distention   - Advance tube feeds to goal as tolerated   - Continue bowel regimen   - Keep HOB at 30    Supportive Care consult   - Patient currently no CPR, no intubation, no lab studies/diagnostic tests   - 3/27 - Discussed plan to continue to wean off BiPAP    - will plan for family meeting via conference call with supportive care on 4/3.        Plan:  -Supportive care to touch base with family about possible hospice. Wean vent when able     Perrin Smack, MD  05/17/2018, 16:41      Late entry for 05/17/18. I saw and examined the patient.  I reviewed the resident's note.  I agree with the findings and plan of care as documented in the resident's note.  Any exceptions/additions are edited/noted.    Ann Held, MD

## 2018-05-18 ENCOUNTER — Inpatient Hospital Stay (HOSPITAL_COMMUNITY): Payer: Medicaid Other

## 2018-05-18 DIAGNOSIS — J90 Pleural effusion, not elsewhere classified: Secondary | ICD-10-CM

## 2018-05-18 DIAGNOSIS — J9811 Atelectasis: Secondary | ICD-10-CM

## 2018-05-18 DIAGNOSIS — J181 Lobar pneumonia, unspecified organism: Secondary | ICD-10-CM

## 2018-05-18 LAB — BASIC METABOLIC PANEL
ANION GAP: 6 mmol/L (ref 4–13)
BUN/CREA RATIO: 12 (ref 6–22)
BUN: 6 mg/dL — ABNORMAL LOW (ref 8–25)
CALCIUM: 7.9 mg/dL — ABNORMAL LOW (ref 8.5–10.2)
CHLORIDE: 101 mmol/L (ref 96–111)
CHLORIDE: 101 mmol/L (ref 96–111)
CO2 TOTAL: 36 mmol/L — ABNORMAL HIGH (ref 22–32)
CREATININE: 0.52 mg/dL — ABNORMAL LOW (ref 0.62–1.27)
ESTIMATED GFR: >60 mL/min/1.73mˆ2 (ref >60)
GLUCOSE: 137 mg/dL (ref 65–139)
POTASSIUM: 3 mmol/L — ABNORMAL LOW (ref 3.5–5.1)
SODIUM: 143 mmol/L (ref 136–145)

## 2018-05-18 LAB — POC BLOOD GLUCOSE (RESULTS): GLUCOSE, POC: 137 mg/dL — ABNORMAL HIGH (ref 70–105)

## 2018-05-18 LAB — ECG 12-LEAD
Atrial Rate: 112 {beats}/min
Calculated P Axis: 47 degrees
Calculated R Axis: 21 degrees
Calculated T Axis: 147 degrees
PR Interval: 140 ms
PR Interval: 140 ms
QRS Duration: 94 ms
QT Interval: 358 ms
QTC Calculation: 488 ms
Ventricular rate: 112 {beats}/min

## 2018-05-18 LAB — CBC
HCT: 28.2 % — ABNORMAL LOW (ref 38.9–52.0)
HGB: 8.1 g/dL — ABNORMAL LOW (ref 13.4–17.5)
MCH: 26.7 pg (ref 26.0–32.0)
MCHC: 28.7 g/dL — ABNORMAL LOW (ref 31.0–35.5)
MCV: 93.1 fL (ref 78.0–100.0)
MPV: 11.4 fL (ref 8.7–12.5)
PLATELETS: 421 x10ˆ3/uL — ABNORMAL HIGH (ref 150–400)
RBC: 3.03 x10ˆ6/uL — ABNORMAL LOW (ref 4.50–6.10)
RDW-CV: 17.9 % — ABNORMAL HIGH (ref 11.5–15.5)
WBC: 8.2 10*3/uL (ref 3.7–11.0)

## 2018-05-18 LAB — PHOSPHORUS: PHOSPHORUS: 3.7 mg/dL (ref 2.4–4.7)

## 2018-05-18 LAB — MAGNESIUM: MAGNESIUM: 2.1 mg/dL (ref 1.6–2.6)

## 2018-05-18 MED ORDER — POTASSIUM CHLORIDE ER 20 MEQ TABLET,EXTENDED RELEASE(PART/CRYST)
40.0000 meq | ORAL_TABLET | ORAL | Status: DC
Start: 2018-05-18 — End: 2018-05-18
  Administered 2018-05-18: 07:00:00

## 2018-05-18 MED ORDER — METOPROLOL TARTRATE 5 MG/5 ML INTRAVENOUS SOLUTION
5.0000 mg | Freq: Once | INTRAVENOUS | Status: AC
Start: 2018-05-18 — End: 2018-05-18

## 2018-05-18 MED ORDER — POTASSIUM BICARBONATE-CITRIC ACID 20 MEQ EFFERVESCENT TABLET
40.0000 meq | EFFERVESCENT_TABLET | ORAL | Status: AC
Start: 2018-05-18 — End: 2018-05-18
  Administered 2018-05-18: 08:00:00 40 meq via GASTROSTOMY
  Filled 2018-05-18: qty 2

## 2018-05-18 MED ORDER — LEVOFLOXACIN 500 MG TABLET
500.0000 mg | ORAL_TABLET | Freq: Every day | ORAL | Status: DC
Start: 2018-05-18 — End: 2018-05-19
  Administered 2018-05-18 – 2018-05-19 (×2): 500 mg via GASTROSTOMY
  Filled 2018-05-18 (×2): qty 1

## 2018-05-18 MED ORDER — METOPROLOL TARTRATE 5 MG/5 ML INTRAVENOUS SOLUTION
INTRAVENOUS | Status: AC
Start: 2018-05-18 — End: 2018-05-18
  Administered 2018-05-18 (×2): 5 mg via INTRAVENOUS
  Filled 2018-05-18: qty 5

## 2018-05-18 MED ORDER — ACETAMINOPHEN 325 MG TABLET
650.00 mg | ORAL_TABLET | ORAL | Status: DC | PRN
Start: 2018-05-18 — End: 2018-05-19
  Administered 2018-05-18: 22:00:00 650 mg via GASTROSTOMY
  Filled 2018-05-18: qty 2

## 2018-05-18 MED ORDER — METOPROLOL TARTRATE 5 MG/5 ML INTRAVENOUS SOLUTION
INTRAVENOUS | Status: AC
Start: 2018-05-18 — End: 2018-05-18
  Administered 2018-05-18: 5 mg via INTRAVENOUS
  Filled 2018-05-18: qty 5

## 2018-05-18 MED ORDER — POTASSIUM CHLORIDE 10 MEQ/100ML IN STERILE WATER INTRAVENOUS PIGGYBACK
10.0000 meq | INJECTION | INTRAVENOUS | Status: AC
Start: 2018-05-18 — End: 2018-05-18
  Administered 2018-05-18 (×2): 10 meq via INTRAVENOUS
  Administered 2018-05-18: 08:00:00 0 meq via INTRAVENOUS
  Administered 2018-05-18: 10 meq via INTRAVENOUS
  Administered 2018-05-18 (×2): 0 meq via INTRAVENOUS
  Filled 2018-05-18 (×3): qty 100

## 2018-05-18 MED ADMIN — sodium chloride 0.9 % (flush) injection syringe: @ 22:00:00

## 2018-05-18 MED ADMIN — melatonin 3 mg tablet: GASTROSTOMY | @ 22:00:00

## 2018-05-18 MED ADMIN — sodium chloride 0.9 % (flush) injection syringe: GASTROSTOMY | @ 12:00:00

## 2018-05-18 MED ADMIN — senna leaf extract 176 mg/5 mL oral syrup: GASTROSTOMY | @ 09:00:00

## 2018-05-18 MED ADMIN — gabapentin 250 mg/5 mL oral solution: GASTROSTOMY | @ 14:00:00

## 2018-05-18 MED ADMIN — potassium chloride ER 20 mEq tablet,extended release(part/cryst): @ 07:00:00

## 2018-05-18 MED ADMIN — acetaminophen 325 mg tablet: GASTROSTOMY | @ 22:00:00

## 2018-05-18 MED ADMIN — sodium chloride 0.9 % (flush) injection syringe: @ 14:00:00

## 2018-05-18 MED ADMIN — sodium chloride 0.9 % (flush) injection syringe: GASTROSTOMY | @ 08:00:00

## 2018-05-18 MED ADMIN — nystatin 100,000 unit/gram topical powder: TOPICAL | @ 08:00:00

## 2018-05-18 MED ADMIN — sodium chloride 0.9 % (flush) injection syringe: @ 06:00:00

## 2018-05-18 MED ADMIN — ondansetron HCl (PF) 4 mg/2 mL injection solution: GASTROSTOMY | @ 08:00:00

## 2018-05-18 MED ADMIN — sodium chloride 0.9 % (flush) injection syringe: INTRAVENOUS | @ 09:00:00

## 2018-05-18 MED ADMIN — oxyCODONE-acetaminophen 5 mg-325 mg tablet: ORAL | @ 14:00:00

## 2018-05-18 MED ADMIN — sodium chloride 0.9 % (flush) injection syringe: INTRAVENOUS | @ 08:00:00

## 2018-05-18 MED ADMIN — sodium chloride 0.9 % (flush) injection syringe: INTRAVENOUS | @ 12:00:00

## 2018-05-18 MED ADMIN — insulin glulisine (U-100) 100 unit/mL subcutaneous solution: GASTROSTOMY | @ 08:00:00

## 2018-05-18 MED ADMIN — PREMIXED HEMODIALYSATE W/ ADDITIVES: INTRAVENOUS | @ 08:00:00

## 2018-05-18 MED ADMIN — sodium chloride 0.9 % (flush) injection syringe: INTRAVENOUS | @ 10:00:00

## 2018-05-18 MED ADMIN — disulfiram 250 mg tablet: GASTROSTOMY | @ 08:00:00

## 2018-05-18 MED ADMIN — promethazine 12.5 mg tablet: INTRAVENOUS | @ 18:00:00

## 2018-05-18 MED ADMIN — etomidate 2 mg/mL intravenous syringe: ORAL | @ 08:00:00

## 2018-05-18 MED ADMIN — heparin (porcine) (PF) 1,000 unit/500 mL in 0.9 % sodium chloride IV: GASTROSTOMY | @ 16:00:00

## 2018-05-18 MED ADMIN — DEXTROSE 10% E48 W/ ADDITIVES: GASTROSTOMY | @ 22:00:00

## 2018-05-18 MED ADMIN — lactated Ringers intravenous solution: TRANSDERMAL | @ 01:00:00

## 2018-05-18 MED ADMIN — lactated Ringers intravenous solution: GASTROSTOMY | @ 12:00:00

## 2018-05-18 MED ADMIN — sodium chloride 0.9 % (flush) injection syringe: SUBCUTANEOUS | @ 22:00:00

## 2018-05-18 NOTE — Nurses Notes (Signed)
Patient's HR 120's at this time. SICU [facility] notified. Verbal orders to give 5 mg IV metoprolol.

## 2018-05-18 NOTE — Progress Notes (Signed)
[facility]                                            Trauma Progress Note                 Date of Birth:  [birth year]  Date of Admission:  04/28/2018  Date of service: 05/18/2018    Antonio Gillespie, 58 y.o., male Post trauma day 20 status post Fall    Events over the last 24 hours have included:  Patient on BIPAP overnight, tolerated well.     Subjective:    Resting in bed , pleasantly delirious.       Objective   24 Hour Summary:    Filed Vitals:    05/18/18 0700 05/18/18 0800 05/18/18 0900 05/18/18 1000   BP: 109/78 111/90 (!) 142/94 105/65   Pulse: (!) 109 (!) 117 (!) 119 (!) 105   Resp: (!) _0 (!) 28   Temp:       SpO2: 95% 100% 97% 99%     Labs:  Recent Labs     05/15/18  1301 05/15/18  1625 05/16/18  0105 05/16/18  0917 05/17/18  0044 05/18/18  0503   WBC  --   --  6.6  --  6.5 8.2   HGB  --   --  6.3* 11.5* 7.4* 8.1*   HCT  --   --  22.0* 39.3 25.4* 28.2*   SODIUM  --   --  136  --  143 143   POTASSIUM  --   --  4.8  --  3.2* 3.0*   CHLORIDE  --   --  111  --  106 101   BICARBONATE 24.0 22.3  --  27.4*  --   --    BUN  --   --  5*  --  6* 6*   CREATININE  --   --  0.46*  --  0.51* 0.52*   ANIONGAP  --   --  4  --  6 6   CALCIUM  --   --  6.1*  --  7.5* 7.9*   MAGNESIUM  --   --  1.6  --  1.9 2.1   PHOSPHORUS  --   --  2.8  --  3.7 3.7       Intake/Output Summary (Last 24 hours) at 05/18/2018 1112  Last data filed at 05/18/2018 1100  Gross per 24 hour   Intake 1710 ml   Output --   Net 1710 ml     Nutrition Management: MNT PROTOCOL FOR DIETITIAN  DIET NPO - NOW  ADULT TUBE FEEDING - CONTINUOUS DRIP CONTINUOUS NO MEALS, TF ONLY; OSMOLITE 1.5; Gastrostomy; Initial Rate (ml/hr): 70; Goal Rate (ml/hr): 70 Last Bowel Movement: 05/18/18  No results for input(s): ALBUMIN, PREALBUMIN in the last 72 hours.  Current Medications:    Current Facility-Administered Medications:   .  acetaminophen (TYLENOL) tablet, 650 mg, Gastric (NG, OG, PEG, GT), Q4H PRN, Dela'O, Connie, MD  .  aspirin chewable tablet 81 mg, 81  mg, Gastric (NG, OG, PEG, GT), Daily, Verlon Au, MD, 81 mg at 05/18/18 0759  .  chlorhexidine gluconate (PERIDEX) 0.12% mouthwash, 15 mL, Swish & Spit, 3x/day, Odetta Pink, MD, 15 mL at 05/18/18 0800  .  enoxaparin PF (LOVENOX) 60 mg/0.6 mL SubQ injection,  0.5 mg/kg, Subcutaneous, Q12H, Verlon Au, MD, 50 mg at 05/18/18 0759  .  gabapentin (NEURONTIN) 356m per 633moral liquid, 400 mg, Gastric (NG, OG, PEG, GT), 3x/day, BoVerlon AuMD, 400 mg at 05/18/18 0800  .  guaiFENesin (ROBITUSSIN) 10047mer 5mL43mal liquid - for cough (expectorant), 10 mL, Gastric (NG, OG, PEG, GT), Q6H, Kotecki, Ryan, DO, 10 mL at 05/17/18 2201  .  lactobacillus rhamnosus (CULTURELLE) active cultures capsule, 1 Cap, Gastric (NG, OG, PEG, GT), 2x/day-Food, BaliMarquette Old, 1 Cap at 05/18/18 [id number] lanolin-oxyquin-pet, hydrophil (BAG BALM) topical ointment, , Apply Topically, 2x/day PRN, BootVerlon Au  .  levoFLOXacin (LEVAQUIN) tablet, 500 mg, Gastric (NG, OG, PEG, GT), Daily, Kotecki, Ryan, DO  .  melatonin tablet, 6 mg, Gastric (NG, OG, PEG, GT), NIGHTLY, BootVerlon Au, 6 mg at 05/17/18 2202  .  metoprolol tartrate (LOPRESSOR) tablet, 12.5 mg, Gastric (NG, OG, PEG, GT), 2x/day, [place] Old, 12.5 mg at 05/18/18 0759  .  INSERT & MAINTAIN PERIPHERAL IV ACCESS, , , UNTIL DISCONTINUED **AND** DRESSING CHANGE Saline Lock; DRY STERILE, , , PRN **AND** NS flush syringe, 2 mL, Intracatheter, Q8HRS, Stopped at 05/18/18 0600 **AND** NS flush syringe, 2-6 mL, Intracatheter, Q1 MIN PRN, BootVerlon Au, 2 mL at 05/17/18 1358  .  NS flush syringe, 10-30 mL, Intracatheter, Q8HRS, KnigHarvin HazelnnAnderson [place], Stopped at 05/18/18 0600  .  NS flush syringe, 20-30 mL, Intracatheter, Q1 MIN PRN, KnigHarvin HazelnnAnderson Malta  .  nystatin (NYSTOP) 100,000 units/g topical powder, , Apply Topically, 2x/day, Dela'O, ConnMarlowe Kays  .  prenatal vitamin-iron-folic acid tablet, 1 Tab, Gastric (NG, OG, PEG, GT), Daily, BootVerlon Au, 1 Tab at 05/18/18 0759  .  scopolamine 1 mg over 3 days transdermal patch, 1 Patch, Transdermal, Q72H, BaliMarquette Old  .  senna concentrate (SENNA) 528mg63m 15mL 63m liquid, 10 mL, Gastric (NG, OG, PEG, GT), 2x/day, Kotecki, Ryan, DO, Stopped at 05/18/18 0900    Today's Physical Exam:  GEN:   NAD and Appears chronically Ill  NECK:   Trach in place,  PULM:   on BiPap  CV:   Regular rate and rhythm  ABD:   Distended, PEG in place.  NEURO:  Awakens to stimuli.   Integumentary:  Pink, warm, and dry. Scattered dry wounds over lower extremities and left upper lip    Assessment/ Plan:   Active Hospital Problems   (*Primary Problem)    Diagnosis   . *Fall     "found down" and taken to outside hospital     . Altered mental status, unspecified   . Atrial fibrillation (CMS HCC)   . Failure to wean from mechanical ventilation (CMS HCC)   . Anemia   . COPD (chronic obstructive pulmonary disease) (CMS HCC)     Chronic   . Thrombocytopenia Unspecified   . Hypokalemia   . Alcohol abuse   . Acute respiratory failure (CMS HCC)   . Nasal fracture     Acute vs chronic     . Abrasions of multiple sites   . Scalp hematoma   . Humeral fracture     nonop per ortho     . Left inguinal hernia     S/p reduction     . Anasarca     DVT prophylaxis:  Enoxaparin and SCDs/ Venodynes/Impulse boots  Anticoagulants (last 24 hours)     Date/Time Action Medication Dose    05/10/18 2106 Given  enoxaparin PF (LOVENOX) 60 mg/0.6 mL SubQ injection 50 mg    05/10/18 8032 Given    enoxaparin PF (LOVENOX) 60 mg/0.6 mL SubQ injection 50 mg        Nutrition: MNT PROTOCOL FOR DIETITIAN  DIET NPO - NOW  ADULT TUBE FEEDING - CONTINUOUS DRIP CONTINUOUS NO MEALS, TF ONLY; OSMOLITE 1.5; Gastrostomy; Initial Rate (ml/hr): 70; Goal Rate (ml/hr): 70 diet, Last Bowel Movement: 05/18/18  Activity: OOB to chair   RUE nonweight bearing  Pain:   Analgesics (last 24 hours)     Date/Time Action Medication Dose    05/11/18 0511 Given    acetaminophen  (TYLENOL) 160 mg per 5 mL liquid - grape flavored 650 mg    05/11/18 0044 Given    acetaminophen (TYLENOL) 160 mg per 5 mL liquid - grape flavored 650 mg        PT Recommendations: skilled nursing facility    Pneumonia   - BAL 3/26 - PMNs, no organisms   -new consolidation, ok for abx per SICU.     R Humerus Fracture   - Ortho Consult   - Non-op and NWB    KUB with air throughout the colon, mild distention   - Advance tube feeds to goal as tolerated   - Continue bowel regimen   - Keep HOB at 30    Supportive Care consult   - Patient currently no CPR, no intubation, no lab studies/diagnostic tests   - 3/27 - Discussed plan to continue to wean off BiPAP    - will plan for family meeting via conference call with supportive care on 4/3.        Plan:  -Supportive care to touch base with family about possible hospice. Wean vent when able     Perrin Smack, MD  05/18/2018, 11:13      I saw and examined the patient.  I reviewed the resident's note.  I agree with the findings and plan of care as documented in the resident's note.  Any exceptions/additions are edited/noted.    Ann Held, MD

## 2018-05-18 NOTE — Care Plan (Signed)
Patient had 1 4 hour atc trial this am with speaking valve in place for about an hour and a half.  Patient had another atc trial this afternoon for 2.5 hours with speaking valve t/o.  Placed back on Invasive BiPAP for rest and decreased sats.  Patient continues to have copious amounts of thin secretions from above and below the trach tube.  Patient to have ATC trials during the day with intermittent rest on BiPAP and then BiPAP qhs.

## 2018-05-18 NOTE — Progress Notes (Signed)
Family Phone call    The designated family member was able to be reached by phone for updates.  An update of the patient's status was reviewed and all questions were answered.    Juanell Fairly, MD  05/18/2018, 16:08

## 2018-05-18 NOTE — Nurses Notes (Signed)
05/18/18 1200   Vital Signs   Heart Rate (!) 194     1200: Patient's HR sustaining in the 190s at this time. Pt asymptomatic. SICU called and notified. 5 IV metoprolol ordered and given.   1201: HR 100's-120s

## 2018-05-18 NOTE — Care Plan (Signed)
[facility]  Rehabilitation Services  Physical Therapy Progress Note      Patient Name: Antonio Gillespie  Date of Birth: Mar 15, 1960  Height:  172.7 cm (5\' 8" )  Weight:  86.8 kg (191 lb 5.8 oz)  Room/Bed: 02/A  Payor: OUTSOURCE PENDING MEDICAID / Plan: ELIGIBILITY PENDING MEDICAID / Product Type: Medicaid /     Assessment:     Antonio Gillespie demonstrates excellent response to PT intervention this date. Able to complete supine to sit with maxAx1, STS from EOB with modAx2 and sara stedy. STS from elevated sara stedy with CGA x 3 trials. Pt continues to require SNF upon medically appropriate d/c to address functional deficits and safety concerns. Will continue to follow.     Discharge Needs:   Equipment Recommendation: TBD  Discharge Disposition: skilled nursing facility    JUSTIFICATION OF DISCHARGE RECOMMENDATION   Based on current diagnosis, functional performance prior to admission, and current functional performance, this patient requires continued PT services in skilled nursing facility in order to achieve significant functional improvements in these deficit areas: aerobic capacity/endurance, ergonomics and body mechanics, gait, locomotion, and balance, integumentary integrity, joint integrity and mobility, motor function, muscle performance, neuromotor development and sensory integration, neuromuscular, posture, ROM (range of motion), ventilation and respiration/gas exchange.      Plan:   Continue to follow patient according to established plan of care.  The risks/benefits of therapy have been discussed with the patient/caregiver and he/she is in agreement with the established plan of care.     Subjective & Objective:        05/18/18 1120   Therapist Pager   PT Assigned/ Pager # Luther Parody [phone]   Rehab Session   Document Type therapy progress note (daily note)   Total PT Minutes: 23   Patient Effort good   General Information   Patient Profile Reviewed yes   Medical Lines PIV Line   Respiratory Status ventilator      Existing Precautions/Restrictions DNR;fall precautions;weight bearing restriction   Mutuality/Individual Preferences   Anxieties, Fears or Concerns None noted.    Individualized Care Needs OOB with sara stedy or maximove as appropriate.    Patient-Specific Goals (Include Timeframe) Improve mobility.    Pre Treatment Status   Pre Treatment Patient Status Patient supine in bed;Call light within reach;Telephone within reach;Nurse approved session   Support Present Pre Treatment  None   Communication Pre Treatment  Nurse   Communication Pre Treatment Comment RN medically cleared pt for participation in therapy intervention.   Cognitive Assessment/Interventions   Behavior/Mood Observations alert;confused;cooperative   Orientation Status oriented to;person   Attention moderate impairment;needs re-direction;difficulty attending to task/directions;distractible;difficult dividing attention   Follows Commands follows one step commands;increased processing time needed;initiation impaired;physical/tactile prompts required;repetition of directions required   Vital Signs   O2 Delivery Pre Treatment supplemental O2   O2 Delivery Post Treatment supplemental O2   Vitals Comment VSS   Pain Assessment   Pre/Posttreatment Pain Comment Denied pain throughout session.   Bed Mobility Assessment/Treatment   Bed Mobility, Assistive Device Head of Bed Elevated;draw sheet   Supine-Sit Independence maximum assist (25% patient effort)   Safety Issues decreased use of arms for pushing/pulling;decreased use of legs for bridging/pushing;impaired trunk control for bed mobility;cognitive deficits limit understanding   Impairments balance impaired;cognition impaired;coordination impaired;endurance;flexibility decreased;postural control impaired;ROM decreased;strength decreased;motor control impaired   Transfer Assessment/Treatment   Sit-Stand Independence moderate assist (50% patient effort);2 person assist required;verbal cues required   Stand-Sit  Independence moderate assist (  50% patient effort);2 person assist required;verbal cues required   Sit-Stand-Sit, Assist Device Corene Cornea   Bed-Chair Independence dependent (less than 25% patient effort);1 person + 1 person to manage equipment   Bed-Chair-Bed Assist Device Corene Cornea   Transfer Safety Issues balance decreased during turns;step length decreased;weight-shifting ability decreased;sequencing ability decreased;cognitive deficits limit understanding   Transfer Impairments balance impaired;coordination impaired;endurance;flexibility decreased;postural control impaired;ROM decreased;strength decreased;motor control impaired   Gait Assessment/Treatment   Independence  not appropriate to assess   Balance Skill Training   Comment with sara stedy   Sitting Balance: Static fair + balance   Sitting, Dynamic (Balance) fair balance   Sit-to-Stand Balance poor + balance   Standing Balance: Static fair - balance   Standing Balance: Dynamic unable to balance   Systems Impairment Contributing to Balance Disturbance musculoskeletal;cognitive;neuromuscular;somatosensory   Identified Impairments Contributing to Balance Disturbance impaired coordination;abnormal muscle tone;impaired motor control;impaired postural control;decreased ROM;decreased strength;other (see comments)   Functional Endurance Training   Comment, Functional Endurance Fair -    Post Treatment Status   Post Treatment Patient Status Patient sitting in bedside chair or w/c;Call light within reach;Telephone within reach;Sitter select activated   Support Present Programmer, systems Post Treatment Comment Pt performance with PT session.    Plan of Care Review   Plan Of Care Reviewed With patient   Basic Mobility Am-PAC/6Clicks Score   Turning in bed without bedrails 2   Lying on back to sitting on edge of flat bed 2   Moving to and from a bed to a chair 1   Standing up from chair 2   Walk in room 1      Climbing 3-5 steps with railing 1   6 Clicks Raw Score total 9   Standardized (t-scale) score 25.8   CMS 0-100% Score 77.59   CMS Modifier CL   Patient Mobility Goal (JHHLM) 5- Stand 1 minute or more 3X/day   Exercise/Activity Level Performed 5- Static standing>1 minute   Physical Therapy Clinical Impression   Assessment Mr. Hassebrock demonstrates excellent response to PT intervention this date. Able to complete supine to sit with maxAx1, STS from EOB with modAx2 and sara stedy. STS from elevated sara stedy with CGA x 3 trials. Pt continues to require SNF upon medically appropriate d/c to address functional deficits and safety concerns. Will continue to follow.    Anticipated Equipment Needs at Discharge (PT) TBD   Anticipated Discharge Disposition skilled nursing facility       Therapist:   Hyacinth Meeker, PT, DPT   Pager #: [phone]

## 2018-05-18 NOTE — Care Plan (Signed)
[facility]  Rehabilitation Services  Speech Therapy Progress Note    Patient Name: Antonio Gillespie  Date of Birth: 12/01/1960  Weight:  86.8 kg (191 lb 5.8 oz)  Room/Bed: 02/A  Payor: OUTSOURCE PENDING MEDICAID / Plan: ELIGIBILITY PENDING MEDICAID / Product Type: Medicaid /      Date/Time of Admission: 04/28/2018  3:32 PM  Admitting Diagnosis:  Scalp hematoma [S00.03XA]    Assessment:  Speaking Valve   Tolerated:yes   Wear time:16-30 minutes   Vocal quality: WFL  Speech-Language-Cognition   Pt responds to basic questions and directions;however, majority of speech is garbled with confused content.  Pt oriented to self only.    JUSTIFICATION OF DISCHARGE RECOMMENDATION  Based on current diagnosis, functional performance prior to admission, and current  functional performance, this patient requires continued Speech services   in Skilled Nursing Facility in order to achieve significant functional improvements in these   deficit areas: Cognition, Speech and Language and Swallowing.    Plan:  Recommendations:Patient should wear speaking valve during all waking hours to facilitate secretion management and improve swallow function.   Results & Recommendations Discussed With:Patient and Nurse   Continue to follow patient according to established plan of care.  The risks/benefits of therapy have been discussed with the patient/caregiver and he/she is in agreement with the established plan of care.     Subjective:  Level of consciousness:Alert   Positioning:Bed   Trach size: 8 and cuffed   Respiratory status:ATC   Nutrition:PEG     Objective:  Speaking Valve:   Voicing:WFL   Intelligibility cues:slow rate and breath support   Oxygen saturations:maintained   Wear time:16-30 minutes   Tolerated:yes   Stoma blast:no   Patient able to needs assistance placing valve and needs assistance removing valve   Precautions posted in room and discussed with RN and RT   Speech/Language/Cognition:   Oriented to: person only  Follows  requests: simple   Connected speech: Garbled with confused content    Therapist:  Quentez Lober, SLP   Pager #: [phone]  Phone#: [phone]  Treatment Time: 18 minutes

## 2018-05-18 NOTE — Care Plan (Signed)
[facility]  Rehabilitation Services  Occupational Therapy Progress Note    Patient Name: Antonio Gillespie  Date of Birth: 09/21/1960  Height:  172.7 cm (5\' 8" )  Weight:  86.8 kg (716 lb 5.8 oz)  Room/Bed: 02/A  Payor: OUTSOURCE PENDING MEDICAID / Plan: ELIGIBILITY PENDING MEDICAID / Product Type: Medicaid /     Assessment:    Pt tolerated OT treatment well. Pt continues to make progress with activity tolerance, strength, and balance.  Now able to tolerate standing in the sara stedy and several sit-stand transfers.  Pt unsafe to return home and will require SNF for continued rehab at d/c.       Discharge Needs:   Equipment Recommendation: to be determined    Discharge Disposition:  skilled nursing facility     JUSTIFICATION OF DISCHARGE RECOMMENDATION   Based on current diagnosis, functional performance prior to admission, and current functional performance, this patient requires continued OT services in skilled nursing facility in order to achieve significant functional improvements.    Plan:   Continue to follow patient according to established plan of care.  The risks/benefits of therapy have been discussed with the patient/caregiver and he/she is in agreement with the established plan of care.     Subjective & Objective:        05/18/18 1119   Therapist Pager   OT Assigned/ Pager # Alcario Drought [phone]   Rehab Session   Document Type therapy progress note (daily note)   Total OT Minutes: 23   Patient Effort good   General Information   Patient Profile Reviewed yes   Medical Lines PIV Line   Respiratory Status ventilator   Existing Precautions/Restrictions DNR;fall precautions;weight bearing restriction  (R UE NWB)   Pre Treatment Status   Pre Treatment Patient Status Patient supine in bed;Call light within reach;Telephone within reach   Support Present Pre Treatment  None   Communication Pre Treatment  Nurse   Communication Pre Treatment Comment Pt and nursing agreeable to OT treatment with professional assist of  PT.    Mutuality/Individual Preferences   Individualized Care Needs Up with sara stedy assist of 2; NWB R UE.    Pain Assessment   Additional Documentation   (Pt with c/o R UE pain but did not rate. )   Coping/Psychosocial Response Interventions   Plan Of Care Reviewed With patient   Cognitive Assessment/Interventions   Behavior/Mood Observations alert;cooperative   Attention WNL/WFL   Follows Commands follows one step commands;repetition of directions required;physical/tactile prompts required   Comment Pt requires motivational cues to participate.    Musculoskeletal   General Mobility generalized weakness   Bed Mobility Assessment/Treatment   Bed Mobility, Assistive Device Head of Bed Elevated   Supine-Sit Independence moderate assist (50% patient effort)   Safety Issues decreased use of legs for bridging/pushing;decreased use of arms for pushing/pulling   Impairments strength decreased;ROM decreased;endurance;balance impaired   Comment CGA for EOB static sitting balance.    Transfer Assessment/Treatment   Sit-Stand Independence moderate assist (50% patient effort);2 person assist required   Stand-Sit Independence contact guard assist   Sit-Stand-Sit, Assist Device Corene Cornea   Bed-Chair Independence dependent (less than 25% patient effort)   Bed-Chair-Bed Assist Device Corene Cornea   Transfer Impairments balance impaired;coordination impaired;endurance;flexibility decreased;postural control impaired;ROM decreased;strength decreased;motor control impaired   Transfer Comment Mod A x2 to stand from EOB using the sara stedy.  Pt able to complete several sit-stand transfers from elevated sara stedy with CGA-SPV using L  UE to assist.  Pt Dep for transfer to chair via sara stedy.    ADL Assessment/Intervention   ADL Comments Dep for all self care.  Functional mobility complete to increased strength, balance, endurance, and IND with toilet transfers and standing ADLs.    Therapeutic Exercise/Activity   Comment AAROM  exercises completed in L UE shoulder, elbow, and fingers.  AAROM exercises completed in R UE elbow, wrist, and fingers.    Post Treatment Status   Post Treatment Patient Status Patient sitting in bedside chair or w/c;Call light within reach;Telephone within reach   Support Present Post Treatment  None   Communication Post Treatement Nurse   Communication Post Treatment Comment Nursing notified of mobility status    Clinical Impression   Functional Level at Time of Session Pt tolerated OT treatment well. Pt continues to make progress with activity tolerance, strength, and balance.  Now able to tolerate standing in the sara stedy and several sit-stand transfers.  Pt unsafe to return home and will require SNF for continued rehab at d/c.    Anticipated Equipment Needs at Discharge to be determined   Anticipated Discharge Disposition skilled nursing facility       Therapist:   Janne Napoleon, OT  Pager #: [phone]

## 2018-05-18 NOTE — Respiratory Therapy (Signed)
Delivery Mode INVASIVE    FIO2 (%) 40    IPAP level (cm/H2O) 14    EPAP level (cm/H2O) 8    Indications IMPROVE VENTILATION      Pt currently resting on above BiPAP settings tolerating well. Pt suctioning for a large amount of thin tan secretions. No issues at this time. Will continue ATC trials in the AM. Will continue to monitor and wean as tolerated.

## 2018-05-18 NOTE — Progress Notes (Signed)
[facility]  Caregiver Phone Call       05/18/2018 and 13:59      Caregiver Name: Waldron Session     Length of Time on Call: approximately 5 minutes  Summary of Conversation: Updated on progress of ATC/Speaking valve.  Updated on initiation of antibiotics.    Lysle Rubens, D.O. 05/18/2018 13:59  [facility]  Department of Anesthesiology, CA1/PGY2

## 2018-05-18 NOTE — Progress Notes (Addendum)
[facility]        SICU PROGRESS NOTE    Antonio Gillespie, Antonio Gillespie  Date of Admission:  04/28/2018  Date of Service: 05/18/2018  Date of Birth:  05-25-1960     Primary Attending: Harvin Hazel, Anderson Malta, *  Primary Service:  TRAUMA BLUE SIC   LOS: 20 days     This is a 58 y.o. male who was found down surrounded by urine and feces.  Is now s/p tracheostomy and PEG placement admitted to the SICU due to aspiration on tube feeds.    Subjective:  Patient tolerated 1 hour of ATC trial in the AM and about 2 hours in the PM yesterday.  Tolerated speaking valve without complications.    Vital Signs:  Temp (24hrs) Max:39.3 C (683.4 F)      Systolic (19QQI), WLN:989 , Min:86 , QJJ:941     Diastolic (74YCX), KGY:18, Min:56, Max:112    Temp  Avg: 37.3 C (99.1 F)  Min: 36.7 C (98.1 F)  Max: 39.3 C (102.7 F)  MAP (Non-Invasive)  Avg: 89 mmHG  Min: 67 mmHG  Max: 120 mmHG  Pulse  Avg: 99.2  Min: 76  Max: 123  Resp  Avg: 23.8  Min: 17  Max: 34  SpO2  Avg: 96.8 %  Min: 89 %  Max: 100 %  Pain Score (Numeric, Faces): 0    Physical Exam:   General:  Chronically ill.  Appears older than stated age.  Eyes:  Conjunctiva clear. Pupils reactive bilaterally  HENT:  Trachea midline, tracheostomy in place.  Lungs:  Diminished breath sounds bilaterally.  Tracheostomy in place  Cardiovascular:  Irregular rhythm.  Abdomen:  Soft, no rebound or tenderness.  Mildly distended.  Extremities:  No gross deformities  Skin:  Skin warm and dry  Neurologic:  Follows commands.  GCS 4-6-1 (tracheostomy)    Labs:  I have reviewed all lab results.  Lab Results Today:    Results for orders placed or performed during the hospital encounter of 04/28/18 (from the past 24 hour(s))   PROCALCITONIN   Result Value Ref Range    PROCALCITONIN  0.08 <0.50 ng/mL   CBC   Result Value Ref Range    WBC 8.2 3.7 - 11.0 x10^3/uL    RBC 3.03 (L) 4.50 - 6.10 x10^6/uL    HGB 8.1 (L) 13.4 - 17.5 g/dL    HCT 28.2 (L) 38.9 - 52.0 %    MCV 93.1 78.0 - 100.0 fL    MCH 26.7  26.0 - 32.0 pg    MCHC 28.7 (L) 31.0 - 35.5 g/dL    RDW-CV 17.9 (H) 11.5 - 15.5 %    PLATELETS 421 (H) 150 - 400 x10^3/uL    MPV 11.4 8.7 - 12.5 fL   BASIC METABOLIC PANEL   Result Value Ref Range    SODIUM 143 136 - 145 mmol/L    POTASSIUM 3.0 (L) 3.5 - 5.1 mmol/L    CHLORIDE 101 96 - 111 mmol/L    CO2 TOTAL 36 (H) 22 - 32 mmol/L    ANION GAP 6 4 - 13 mmol/L    CALCIUM 7.9 (L) 8.5 - 10.2 mg/dL    GLUCOSE 137 65 - 139 mg/dL    BUN 6 (L) 8 - 25 mg/dL    CREATININE 0.52 (L) 0.62 - 1.27 mg/dL    BUN/CREA RATIO 12 6 - 22    ESTIMATED GFR >60 >60 mL/min/1.76m2   MAGNESIUM   Result Value  Ref Range    MAGNESIUM 2.1 1.6 - 2.6 mg/dL   PHOSPHORUS   Result Value Ref Range    PHOSPHORUS 3.7 2.4 - 4.7 mg/dL   POC BLOOD GLUCOSE (RESULTS)   Result Value Ref Range    GLUCOSE, POC 137 (H) 70 - 105 mg/dl       Recent Imaging:  Results for orders placed or performed during the hospital encounter of 04/28/18 (from the past 72 hour(s))   XR ABD SUPINE     Status: None    Narrative    Antonio Gillespie  Male, 58 years old.    XR ABD SUPINE performed on 05/17/2018 4:28 AM.    REASON FOR EXAM:  Abdominal distention, assess for ileus    COMPARISON: Abdomen radiograph 05/12/2018 and priors. Chest radiograph  05/14/2018 and priors.    TECHNIQUE: Supine view of the abdomen was obtained.    FINDINGS:  Borderline dilation of the small and large bowel loops, increased since  05/12/2018.    Lung bases without large pleural effusion.        Impression    --Borderline dilation of the small and large bowel loops, increased since  05/12/2018, can reflect developing bowel obstruction. Attention on follow-up  study.     XR CHEST AP     Status: None    Narrative    Antonio Gillespie  Male, 58 years old.    XR CHEST AP performed on 05/17/2018 4:54 PM.    REASON FOR EXAM:  concern for infection vs atelectasis    TECHNIQUE: 1 views/1 images submitted for interpretation.    COMPARISON:  05/14/2018    FINDINGS:  There is a shallow inspiration. Cardiomediastinal enlargement  is  unchanged. Increasing opacity is present at the right base. Atelectasis  likely persists at the left base. There is no visible pleural effusion or  pneumothorax.    A tracheostomy is unchanged.      Impression    Increasing opacity at the right base which could represent developing  pneumonia. Atelectasis could also be considered.         Assessment/ Plan:  Active Hospital Problems    Diagnosis   . Primary Problem: Fall   . Altered mental status, unspecified   . Atrial fibrillation (CMS HCC)   . Failure to wean from mechanical ventilation (CMS HCC)   . Anemia   . COPD (chronic obstructive pulmonary disease) (CMS HCC)   . Thrombocytopenia Unspecified   . Hypokalemia   . Alcohol abuse   . Acute respiratory failure (CMS HCC)   . Nasal fracture   . Abrasions of multiple sites   . Scalp hematoma   . Humeral fracture   . Left inguinal hernia   . Anasarca       Antonio Gillespie is a 58 y.o. male with a history of atrial fibrillation, COPD, EtOH abuse who was originally found down covered in urine/feces.  Patient underwent trachestomy and PEG tube placement on 05/03/2018 and was transferred to step down on 3/25.  However, patient aspirated while taking tube feeds and was transferred to SICU due to concerns for aspiration.    Neuro: GCS 4-6-1T  Mental status continues to improve.  Continue multimodal pain medication:    Tylenol PRN  Known alcoholic: Scheduled ativan with taper completed.  Continue Gabapentin 416m TID  Sleep: Melatonin      Resp:   Respiratory Rate: (!) 21  SpO2: 100 %  No results found for this encounter  Not on Ventilator - ATC trials during day, BiPAP at night or for increased work of breathing.  Tolerated ATC 1 hour yesterday AM and about 2 hours in the PM.  Speaking valve periodically throughout the day.  Copious amounts of secretions.  Decreased breath sounds bilaterally.  Procalcitonin 05/17/2018: 0.08  CXR 05/17/2018: Increasing opacity at the right base.   Patient was febrile to 39.3 yesterday.   - Will  consider starting antibiotics (Augmentin)  BAL 3/26 - No growth over 3 days  Nebs: duo nebs QID PRN  Scopolamine patch for secretions   - Guaifenesin added yesterday      CV:  SBP over last 24:  Systolic (67EHM), CNO:709 , Min:86 , Max:147   Irregular rhythm  Hemodynamically acceptable. Cont to monitor  ASA 108m daily  Metoprolol 12.552mBID      GI:    Tube Feeds - Osmolite 1.5 @ 605mr (at goal)  Bowel Reg-continue lactobacillus  Last Bowel Movement: 05/17/18        ENDO:  Glucose:  100s-120s   Diabetes A1C: 4.8      Renal/FEN:     Intake/Output Summary (Last 24 hours) at 05/18/2018 0549  Last data filed at 05/18/2018 0400  Gross per 24 hour   Intake 1575 ml   Output --   Net 1575 ml     Recent Labs     05/15/18  1301 05/15/18  1625 05/16/18  0105 05/16/18  0917 05/17/18  0044 05/18/18  0503   SODIUM  --   --  136  --  143 143   POTASSIUM  --   --  4.8  --  3.2* 3.0*   CHLORIDE  --   --  111  --  106 101   BICARBONATE 24.0 22.3  --  27.4*  --   --    BUN  --   --  5*  --  6* 6*   CREATININE  --   --  0.46*  --  0.51* 0.52*   ANIONGAP  --   --  4  --  6 6   CALCIUM  --   --  6.1*  --  7.5* 7.9*   MAGNESIUM  --   --  1.6  --  1.9 2.1   PHOSPHORUS  --   --  2.8  --  3.7 3.7     Uo: multiple unmeasured urine  IVFS: None as tube feeds ar eat goal   Lytes- Potassium 3.0 this AM, repleted  Foley- no      Heme:  Acute blood loss Anemia-asx  Ensure all blood draws are pedi tubes to min volume taken.  Hgb 8.1 this AM, stable.  Temperature: 36.8 C (98.2 F)  Recent Labs     05/17/18  0044 05/18/18  0503   WBC 6.5 8.2   HGB 7.4* 8.1*   HCT 25.4* 28.2*   PLTCNT 433* 421*     Prophylaxis: SCDs, Lovenox      ID: Temp (12hrs), Avg:37.7 C (99.8 F), Min:36.8 C (98.2 F), Max:39.3 C (102.7 F)   Afebrile  Abx:   Augmentin completed 3/28  Procal 05/17/2018 - 0.08  BAL 3/26 - No growth to date  Given patient was febrile and CXR showing an increasing opacity at right lung base, will consider starting antibiotics today  (Augmentin)      Skin/Musculoskeletal:   Humeral fracture - nonoperative per orthopedics  Scattered abrasions  Continue local wound care  DVT PPX: SCDs, Lovenox  Therapies: PT/OT/Speech - recommend SNF on discharge  Activity OOB to chair TID.  NWB to upper extremity with fracture  Lines: PIV, Midline, G-Tube, Tracheostomy  Disposition: continue ICU.  SNF on discharge  Code Status: No escalation of care, No vasopressors, No transfer back to ICU, DNR/DNI      Colin Benton, D.O. 05/18/2018 05:49  [facility]  Department of Anesthesiology, [facility] Staff    I saw and examined the patient. I reviewed the residents  note. I agree with the findings and plan of care as documented in the resident's note. Any exceptions/additions are edited/noted.    Neuro: GCS 11T  Prn tylenol for pain  Neurontin for LT alcohol management/relapse.  Sleep:melatonin    Resp:  S/p trach- on BIPAP. ATC  today for approx 3-4 hours. Cont trach collar trials   Tolerated speaking valve  CXR with  newconsolidation  Continue aggressive pulmonary hygiene/IS  scoplamine patch for secretions . Add guaifenesin  Touch base with speech for PMV  Will start oral Levofloxaquin  for coverage of likely new consolidation/PNAx7d    IO:NGEX with RVR resolved.  Hemodynamically acceptable. Cont to monitor  Metoprolol 12.5 BID for arrhythmias    GI: Diet:NPOTFs: @ goal  S/p PEG  Bowel Reg- contLast Bowel Movement: 05/17/18  Lactobacillus started(was on Augmentin previously)  GI PPX:DC with vent requirement going away  protein malnutrition- with protein supplementation     Renal/FEN:   BM:WUXL Monitor Uo,  IVFS:none  Lytes-Hypokalemia, replace K to prevent potential neuromuscular or  cardiac events, ileus, renal abnormalities or  glucose intolerance.    Heme:Ac Blood Loss Anemia-asx.Ensure all blood draws are pedi tubes to min volume taken.    ID:  febrile. Starting PNA coverage until clarify family goals     Skin/Musculoskeletal:  Extremities:humeral fx- nonop per ortho  Breakdown:scattered abrasions. No breakdown  continue local wound care  DVT KGM:WNUU, Lovenox  Therapies: PT/OT/Speech  ActivityNWB to upper extremity with fracture  Disposition:cont ICU  Code Status:DNR. no escalation. Family now requesting no labs or diagnositics. Primary to have family meeting.     Critical Care Attestation  Patient withAcute hypoxic/hyercarbic Respiratory insufficiency/Failure requiring frequent monitoring and assessment including including aggressive pulmonary hygiene.Continue to wean BIPAP with goal to ATC increasing. Abx with new consolidation and Fever. Family has limited treatment options. Will optimize as best able  This patient suffers from failure or dysfunction of Neurologic/Sensory, Respiratory, Cardiovascular, GI/Hepatopancreaticobiliary, Infectious/Hematologic and Musculoskeletalsystem(s).     I was present at the bedside of this critically ill patient exclusive of procedures.The care of this patient was in regard to managing (a) conditions(s) that has a high probability of sudden, clinically significant, or life-threatening deterioration and required a high degree of Attending Physician attention and direct involvement to intervene urgently.   Data review and care planning was performed in direct proximity of the patient, examination was obviously performed in direct contact with the patient. All of this time was exclusive of procedure which will be documented elsewhere in the chart.    My critical care time is independent and unique to other providers (no other providers saw patient for purposes of SICU evaluation)  My critical care time involved full attention to the patients'condition and included:    Review of nursing notes and/or old charts  Review of medications, allergies, and vital signs  Documentation time  Consultant  collaboration on findings and treatment options  Care, transfer of  care, and discharge plans  Ordering, interpreting, and reviewing diagnostic studies/tab tests  Obtaining necessary history from family, EMS, nursing home staff and/or treating physicians    My critical care time did not include time spent teaching resident physician(s) or other services of resident physicians, or performing other reported procedures.    Total Critical Care Time:17mnutes    Electronically Signed by:    CTawana ScaleMD, MS  Assistant Professor of Surgery  Trauma, Surgical Critical Care, and [place] 05/18/2018 08:08

## 2018-05-19 ENCOUNTER — Inpatient Hospital Stay (HOSPITAL_COMMUNITY): Payer: Medicaid Other

## 2018-05-19 DIAGNOSIS — Z4682 Encounter for fitting and adjustment of non-vascular catheter: Secondary | ICD-10-CM

## 2018-05-19 MED ORDER — LORAZEPAM 2 MG/ML INJECTION SOLUTION
1.0000 mg | INTRAMUSCULAR | Status: DC | PRN
Start: 2018-05-19 — End: 2018-05-25
  Administered 2018-05-20 – 2018-05-25 (×5): 1 mg via INTRAVENOUS
  Filled 2018-05-19 (×5): qty 1

## 2018-05-19 MED ORDER — MORPHINE CONCENTRATE 10 MG/0.5 ML ORAL SYRINGE (FOR ORAL USE ONLY)
2.50 mg | INJECTION | ORAL | Status: DC | PRN
Start: 2018-05-19 — End: 2018-05-22
  Administered 2018-05-20 – 2018-05-21 (×4): 2.6 mg via ORAL
  Filled 2018-05-19 (×4): qty 0.5

## 2018-05-19 MED ORDER — GLYCOPYRROLATE 0.2 MG/ML INJECTION SOLUTION
200.00 ug | INTRAMUSCULAR | Status: DC | PRN
Start: 2018-05-19 — End: 2018-05-25
  Administered 2018-05-20 – 2018-05-22 (×3): 200 ug via INTRAVENOUS
  Filled 2018-05-19 (×3): qty 1

## 2018-05-19 MED ORDER — ACETAMINOPHEN 325 MG/10.15 ML ORAL SUSPENSION
650.0000 mg | ORAL | Status: DC | PRN
Start: 2018-05-19 — End: 2018-05-25
  Administered 2018-05-21 – 2018-05-22 (×2): 650 mg via GASTROSTOMY
  Filled 2018-05-19 (×2): qty 20.3

## 2018-05-19 MED ADMIN — metoprolol tartrate 25 mg tablet: GASTROSTOMY | @ 21:00:00

## 2018-05-19 MED ADMIN — guaiFENesin 100 mg/5 mL oral liquid: GASTROSTOMY | @ 13:00:00

## 2018-05-19 MED ADMIN — guaiFENesin 100 mg/5 mL oral liquid: GASTROSTOMY | @ 23:00:00

## 2018-05-19 MED ADMIN — folic acid-vit B6-vit B12 2.2 mg-25 mg-0.5 mg tablet: GASTROSTOMY | @ 09:00:00

## 2018-05-19 MED ADMIN — metoprolol tartrate 25 mg tablet: GASTROSTOMY | @ 09:00:00

## 2018-05-19 MED ADMIN — gabapentin 250 mg/5 mL oral solution: GASTROSTOMY | @ 13:00:00

## 2018-05-19 MED ADMIN — guaiFENesin 100 mg/5 mL oral liquid: GASTROSTOMY | @ 06:00:00

## 2018-05-19 MED ADMIN — EPINEPHRINE 15MG IN D5W OR NS 250ML INFUSION: GASTROSTOMY | @ 09:00:00

## 2018-05-19 MED ADMIN — magnesium sulfate 2 gram/50 mL (4 %) in water intravenous piggyback: ORAL | @ 13:00:00

## 2018-05-19 MED ADMIN — sodium chloride 0.9 % (flush) injection syringe: @ 21:00:00

## 2018-05-19 MED ADMIN — rocuronium 10 mg/mL intravenous solution: @ 06:00:00

## 2018-05-19 MED ADMIN — oxymetazoline 0.05 % nasal spray: @ 14:00:00

## 2018-05-19 MED ADMIN — simethicone 40 mg/0.6 mL oral drops,suspension: SUBCUTANEOUS | @ 09:00:00

## 2018-05-19 NOTE — Consults (Signed)
[facility]  Supportive/Palliative Care Medicine Consult  Follow Up Note    Antonio Gillespie, Antonio Gillespie, 58 y.o. male  Date of Service: 05/19/2018  Date of Birth:  09/01/1960    Gillespie Day:  LOS: 21 days     Assessment/Recommendations: Encounter for palliative care. Adult Failure to Thrive secondary to acute hypoxic/hypercarbic respiratory failure s/p tracheostomy placement, Gillespie acquired pneumonia.    Goals of care discussion: Pt lacks DMC. Spoke with pt's brother/HCS Antonio Gillespie) via telephone conference call with Antonio Kiel, RN and Antonio Rod, FNP of supportive care and Dr. [name] and Dr. [name] of trauma surgery. We discussed that pt has new pneumonia (suspected to be aspiration). This is, unfortunately, unpredictable and unpreventable. We discussed what pt would value as important to maintain quality of life. Antonio Gillespie states, "If he's not there and able to talk like you and I are right now, I don't think he'd want to live like this and would want to be made comfortable." Antonio Gillespie requested that pt's sister Antonio Gillespie) be added to conference call; however, attempt at reaching Antonio Gillespie was unsuccessful. I explained comfort measures only to Antonio Gillespie as well as hospice options (inpatient vs SNF). At this time, Antonio Gillespie wishes for comfort measures only with the following treatment limitations: No CPR, Do Not Intubate, No vasopressors, No escalation of care, and No ICU transfer, No antibiotics, No BiPAP, No dialysis, No hyperalimentation, No IVF, No lab studies, No routine diagnostic tests, No tube feedings, No X-Rays. Pt's oxygen will be gradually weaned as per discussion.     I informed Antonio Gillespie that if he and/or Antonio Gillespie would like to come visit pt, they may do so as per COVID 19 visitor policy.     I, again, later attempted to contact Antonio Gillespie via telephone per Antonio Gillespie's request. No answer.    Recommendations for symptom management:  1. Please start Roxanol 2.5 mg PO Q4H PRN pain, dyspnea.  2. Tylenol 650 mg gastric Q4H PRN pain, fever.  3.  Robinul 200 mcg IV Q4H PRN excessive secretions.  4. Ativan 1 mg IV Q4H PRN agitation, restlessness, myoclonic jerking.  5. Continue Scopolamine patch.      Plan of care was communicated with primary team.     Will continue to follow.      Pain: PainAD  0    POST completed:  POST form not discussed    Subjective: Pt resting comfortably in bed this morning with no signs of pain or distress. He remains disoriented. Afebrile. LBM yesterday.    Objective:  Temperature: 36.7 C (98.1 F)  Heart Rate: (!) 102  BP (Non-Invasive): 122/83  Respiratory Rate: (!) 21  SpO2: 90 %  Pain Score (Numeric, Faces): 0  Physical Exam:  General: appears acutely ill and no distress  Eyes: Conjunctiva clear. L pupil 6 mm, round, sluggish. R pupil 5 mm, round, fixed.  ENT: Mouth mucous membranes moist.   Neck: supple, symmetrical, trachea midline. Trach present.   Lungs: Coarse bilaterally. No wheezes or rales   Cardiovascular: regular rate and rhythm, S1, S2 normal, no murmur, click, rub or gallop. 2+ edema to bilateral hands. +2 R radial and L pedal pulses.   Abdomen: Soft, non-tender, Bowel sounds normal, non-distended  Musculoskeletal: Head atraumatic and normocephalic  Skin: Skin warm and dry, No overt rashes   Neurologic: Alert. Attempts to mouth words. Oriented to person.  Psychiatric: restless   Other symptoms present: Delirium        Labs:    Lab Results Today:  No results found for any visits on 04/28/18 (from the past 24 hour(s)).    Ventilator Settings: N/A  HFNC Settings: N/A  BIPAP Settings: $ O2 Delivery: BP  Oxygen Concentration (%): 40  Flow (L/min): 12   Mode: S/T  Spontaneous Volume: 288 mL  Set Rate: 4  Spontaneous Rate: 22 Breaths Per Minute  Minute Volume: 8.2 LPM  FiO2 Set: 40%  IPAP: 14 cmH20  EPAP: 8 cmH2O  PIP: 15 cmH2O  Rise Time %: 2  I-Time: 1 seconds      Imaging Studies:   Results for orders placed or performed during the Gillespie encounter of 04/28/18 (from the past 72 hour(s))   XR ABD SUPINE     Status:  None    Narrative    Antonio Gillespie  Male, 58 years old.    XR ABD SUPINE performed on 05/17/2018 4:28 AM.    REASON FOR EXAM:  Abdominal distention, assess for ileus    COMPARISON: Abdomen radiograph 05/12/2018 and priors. Chest radiograph  05/14/2018 and priors.    TECHNIQUE: Supine view of the abdomen was obtained.    FINDINGS:  Borderline dilation of the small and large bowel loops, increased since  05/12/2018.    Lung bases without large pleural effusion.        Impression    --Borderline dilation of the small and large bowel loops, increased since  05/12/2018, can reflect developing bowel obstruction. Attention on follow-up  study.     XR CHEST AP     Status: None    Narrative    Antonio Gillespie  Male, 58 years old.    XR CHEST AP performed on 05/17/2018 4:54 PM.    REASON FOR EXAM:  concern for infection vs atelectasis    TECHNIQUE: 1 views/1 images submitted for interpretation.    COMPARISON:  05/14/2018    FINDINGS:  There is a shallow inspiration. Cardiomediastinal enlargement is  unchanged. Increasing opacity is present at the right base. Atelectasis  likely persists at the left base. There is no visible pleural effusion or  pneumothorax.    A tracheostomy is unchanged.      Impression    Increasing opacity at the right base which could represent developing  pneumonia. Atelectasis could also be considered.     XR AP MOBILE CHEST     Status: None    Narrative    Antonio Gillespie  Male, 58 years old.    XR AP MOBILE CHEST performed on 05/18/2018 4:22 AM.    REASON FOR EXAM:  concern for aspiration    TECHNIQUE: 1 views/1 images submitted for interpretation.    COMPARISON:  May 17, 2018    FINDINGS:  Cardiomediastinal enlargement is unchanged. There is no  pulmonary edema. Increasing pleural fluid is suspected at the right base.  Subsegmental atelectasis is again identified on the left. There is no  visible pneumothorax.    A tracheostomy is unchanged.      Impression    Suggestion of increasing pleural fluid on the right  with persistent  subsegmental atelectasis at the left base.     XR AP MOBILE CHEST     Status: None    Narrative    XR AP MOBILE CHEST performed on 05/19/2018 4:17 AM.  TECHNIQUE: 1 view/2 image(s) submitted for interpretation.    REASON FOR EXAM:  58 years old Male,  concern for aspiration      Impression    FINDINGS/IMPRESSION:  1.  No interval change moderate right and small left infrahilar pulmonary  opacities, can be related to aspiration pneumonia, scarring, or combination  of the above. Small right base effusion is stable. No significant left  effusion is present. Tracheostomy tube in place. The cardiac silhouette  remains upper normal in size. No evidence of pneumothorax. Comparison with  chest radiograph one day earlier.          Patient/Family Meeting held, present were: pt's brother/HCS Antonio Gillespie). Megan Salon, RN, R. Vivien Rota, FNP, Clyda Hurdle, MD, Scherrie Bateman, MD     Discussion with patient and family included: diagnoses, current problems, treatment limitations, treatment options, hospice, pt/family goals for care and other symptom management    Location of pt/family meeting:  on telephone due to patient's lack of [facility] and family unable to visit   Outcome of pt/family meeting: Comfort measures only. No CPR, Do Not Intubate, No vasopressors, No escalation of care, and No ICU transfer, No antibiotics, No BiPAP, No dialysis, No hyperalimentation, No IVF, No lab studies, No routine diagnostic tests, No tube feedings, No X-Rays     Consultant:  Antonio Rod, APRN,NP-C  Total Time of Encounter:  50 minutes   Time in Counseling as above:  35 minutes   Start time:  1300  Stop Time:  [address] APRN,NP-C  05/19/2018, 16:04

## 2018-05-19 NOTE — Care Management Notes (Signed)
[facility]  Care Management Note    Patient Name: Antonio Gillespie  Date of Birth: 09/28/1960  Sex: male  Date/Time of Admission: 04/28/2018  3:32 PM  Room/Bed: 02/A  Payor: [employer] PENDING MEDICAID / Plan: ELIGIBILITY PENDING MEDICAID / Product Type: Medicaid /    LOS: 21 days   Primary Care Providers:  Selinda Eon, MD, MD (General)    Admitting Diagnosis:  Scalp hematoma [S00.03XA]    Assessment:      05/19/18 1458   Assessment Details   Assessment Type Continued Assessment   Date of Care Management Update 05/19/18   Date of Next DCP Update 05/22/18   Care Management Plan   Discharge Planning Status plan in progress   Discharge Needs Assessment   Discharge Facility/Level of Care Needs Comfort Measures     MSW informed by service that Pt will be CMO.     Discharge Plan:  Comfort Measures    The patient will continue to be evaluated for developing discharge needs.     Case Manager: Placido Sou, SOCIAL WORKER  Phone: [phone]

## 2018-05-19 NOTE — Ancillary Notes (Signed)
SBIRT    SBIRT not completed at this time due to patient's mental status - oriented to self only.  Pending recommendations:  Attempt to complete SBIRT at later time/date.    Jacki Cones, LGSW Clinical Therapist 05/19/2018, 08:48  Pager  [phone]

## 2018-05-19 NOTE — Progress Notes (Signed)
[facility]        SICU PROGRESS NOTE    Gustin, Zobrist  Date of Admission:  04/28/2018  Date of Service: 05/19/2018  Date of Birth:  March 01, 1960     Primary Attending: Harvin Hazel, Anderson Malta, *  Primary Service:  TRAUMA BLUE SIC   LOS: 21 days     This is a 58 y.o. male who was found down surrounded by urine and feces.  Is now s/p tracheostomy and PEG placement admitted to the SICU due to aspiration on tube feeds.    Subjective:  No acute events overnight.  Tolerated 4 hour ATC trial in AM and 2.5hour ATC trial in PM.  Utilizing speaking valve.    Vital Signs:  Temp (24hrs) Max:37.9 C (458.0 F)      Systolic (99IPJ), ASN:053 , Min:100 , ZJQ:734     Diastolic (19FXT), KWI:09, Min:61, Max:111    Temp  Avg: 37.1 C (98.8 F)  Min: 36.1 C (97 F)  Max: 37.9 C (100.2 F)  MAP (Non-Invasive)  Avg: 90.5 mmHG  Min: 70 mmHG  Max: 123 mmHG  Pulse  Avg: 114.8  Min: 97  Max: 194  Resp  Avg: 24.8  Min: 20  Max: 34  SpO2  Avg: 96.7 %  Min: 81 %  Max: 100 %  Pain Score (Numeric, Faces): 0    Physical Exam:   General:  Chronically ill.  Appears older than stated age.  Eyes:  Conjunctiva clear. Pupils reactive bilaterally  HENT:  Trachea midline, tracheostomy in place.  Lungs:  Diminished breath sounds bilaterally.  Tracheostomy in place  Cardiovascular:  Irregular rhythm.  Abdomen:  Soft, no rebound or tenderness.  Mildly distended.  Extremities:  No gross deformities  Skin:  Skin warm and dry  Neurologic:  Follows commands.  GCS 4-6-1 (tracheostomy)    Labs:  I have reviewed all lab results.  Lab Results Today:    No results found for any visits on 04/28/18 (from the past 24 hour(s)).    Recent Imaging:  Results for orders placed or performed during the hospital encounter of 04/28/18 (from the past 72 hour(s))   XR ABD SUPINE     Status: None    Narrative    Jearld Lesch  Male, 58 years old.    XR ABD SUPINE performed on 05/17/2018 4:28 AM.    REASON FOR EXAM:  Abdominal distention, assess for  ileus    COMPARISON: Abdomen radiograph 05/12/2018 and priors. Chest radiograph  05/14/2018 and priors.    TECHNIQUE: Supine view of the abdomen was obtained.    FINDINGS:  Borderline dilation of the small and large bowel loops, increased since  05/12/2018.    Lung bases without large pleural effusion.        Impression    --Borderline dilation of the small and large bowel loops, increased since  05/12/2018, can reflect developing bowel obstruction. Attention on follow-up  study.     XR CHEST AP     Status: None    Narrative    Jearld Lesch  Male, 58 years old.    XR CHEST AP performed on 05/17/2018 4:54 PM.    REASON FOR EXAM:  concern for infection vs atelectasis    TECHNIQUE: 1 views/1 images submitted for interpretation.    COMPARISON:  05/14/2018    FINDINGS:  There is a shallow inspiration. Cardiomediastinal enlargement is  unchanged. Increasing opacity is present at the right base. Atelectasis  likely persists at the left base. There is no visible pleural effusion or  pneumothorax.    A tracheostomy is unchanged.      Impression    Increasing opacity at the right base which could represent developing  pneumonia. Atelectasis could also be considered.     XR AP MOBILE CHEST     Status: None    Narrative    LUCIUS WISE  Male, 58 years old.    XR AP MOBILE CHEST performed on 05/18/2018 4:22 AM.    REASON FOR EXAM:  concern for aspiration    TECHNIQUE: 1 views/1 images submitted for interpretation.    COMPARISON:  May 17, 2018    FINDINGS:  Cardiomediastinal enlargement is unchanged. There is no  pulmonary edema. Increasing pleural fluid is suspected at the right base.  Subsegmental atelectasis is again identified on the left. There is no  visible pneumothorax.    A tracheostomy is unchanged.      Impression    Suggestion of increasing pleural fluid on the right with persistent  subsegmental atelectasis at the left base.         Assessment/ Plan:  Active Hospital Problems    Diagnosis   . Primary Problem: Fall   .  Altered mental status, unspecified   . Atrial fibrillation (CMS HCC)   . Failure to wean from mechanical ventilation (CMS HCC)   . Anemia   . COPD (chronic obstructive pulmonary disease) (CMS HCC)   . Thrombocytopenia Unspecified   . Hypokalemia   . Alcohol abuse   . Acute respiratory failure (CMS HCC)   . Nasal fracture   . Abrasions of multiple sites   . Scalp hematoma   . Humeral fracture   . Left inguinal hernia   . Anasarca       MOUSTAFA MOSSA is a 58 y.o. male with a history of atrial fibrillation, COPD, EtOH abuse who was originally found down covered in urine/feces.  Patient underwent trachestomy and PEG tube placement on 05/03/2018 and was transferred to step down on 3/25.  However, patient aspirated while taking tube feeds and was transferred to SICU due to concerns for aspiration.    Neuro: GCS 4-6-1T  Mental status continues to improve.  Continue multimodal pain medication:    Tylenol PRN  Known alcoholic: Scheduled ativan with taper completed.  Continue Gabapentin 4221m TID  Sleep: Melatonin      Resp:   Respiratory Rate: (!) 31  SpO2: 92 %  No results found for this encounter  ATC trials during day, BiPAP at night or for increased work of breathing.  Tolerated ATC 4 hour yesterday AM and about 2-3hours in the PM.  Speaking valve periodically throughout the day.  Copious amounts of secretions.  Decreased breath sounds bilaterally.  Procalcitonin 05/17/2018: 0.08  CXR 05/17/2018: Increasing opacity at the right base.   7 days of levofloxacin for pneumonia (end date 05/24/18)  BAL 3/26 - No growth over 3 days  Nebs: duo nebs QID PRN  Scopolamine patch for secretions   - Guaifenesin added yesterday      CV:  SBP over last 24:  Systolic (276LYY, ATKP:546, Min:100 , Max:156   Irregular rhythm, tachycardic  Hemodynamically acceptable. Cont to monitor  ASA 867mdaily  Metoprolol 12.21m421mID    - Consider increasing for heart rate control      GI:    Tube Feeds - Osmolite 1.5 @ 19m46m (at goal)  Bowel Reg-continue  lactobacillus  Last Bowel Movement: 05/18/18        ENDO:  Glucose:  100s-120s   Diabetes A1C: 4.8      Renal/FEN:     Intake/Output Summary (Last 24 hours) at 05/19/2018 0556  Last data filed at 05/19/2018 0500  Gross per 24 hour   Intake 1730 ml   Output 150 ml   Net 1580 ml     Recent Labs     05/16/18  0917 05/17/18  0044 05/18/18  0503   SODIUM  --  143 143   POTASSIUM  --  3.2* 3.0*   CHLORIDE  --  106 101   BICARBONATE 27.4*  --   --    BUN  --  6* 6*   CREATININE  --  0.51* 0.52*   ANIONGAP  --  6 6   CALCIUM  --  7.5* 7.9*   MAGNESIUM  --  1.9 2.1   PHOSPHORUS  --  3.7 3.7     Uo: multiple unmeasured urine  IVFS: None as tube feeds are at goal  Foley- no      Heme:  Acute blood loss Anemia-asx  Ensure all blood draws are pedi tubes to min volume taken.  Temperature: 36.9 C (98.4 F)  Recent Labs     05/17/18  0044 05/18/18  0503   WBC 6.5 8.2   HGB 7.4* 8.1*   HCT 25.4* 28.2*   PLTCNT 433* 421*     Prophylaxis: SCDs, Lovenox      ID: Temp (12hrs), Avg:37.6 C (99.6 F), Min:36.9 C (98.4 F), Max:37.9 C (100.2 F)   Afebrile  Abx:   Augmentin completed 3/28.  Levlofloxacin to end 05/24/18 for RLL pneumonia  Procal 05/17/2018 - 0.08  BAL 3/26 - No growth to date      Skin/Musculoskeletal:   Humeral fracture - nonoperative per orthopedics  Scattered abrasions  Continue local wound care    DVT PPX: SCDs, Lovenox  Therapies: PT/OT/Speech - recommend SNF on discharge  Activity OOB to chair TID.  NWB to upper extremity with fracture  Lines: PIV, Midline, G-Tube, Tracheostomy  Disposition: continue ICU.  SNF on discharge  Code Status: No escalation of care, No vasopressors, No transfer back to ICU, DNR/DNI      Colin Benton, D.O. 05/19/2018 05:56  [facility]  Department of Anesthesiology, [facility]    I saw and examined the patient. I reviewed the residents note. I agree with the findings and plan of care as documented in the resident's note. Any  exceptions/additions are edited/noted.    Neuro: GCS 11T  Prn tylenol for pain  Neurontin for LT alcohol management/relapse.  Sleep:melatonin    Resp:  S/p trach- on BIPAP. ATC  today for approx 3-4 hours. Cont trach collar trials   Tolerated speaking valve  CXR with  newconsolidation  Continue aggressive pulmonary hygiene/IS  scoplamine patch for secretions.guaifenesin  oral Levofloxaquin  for coverage of likely new consolidation/PNAx7d    AX:KPVV with RVR resolved.  Hemodynamically acceptable. Cont to monitor  Metoprolol 12.5 BID for arrhythmias    GI: Diet:NPOTFs: @ goal  S/p PEG  Bowel Reg- contLast Bowel Movement: 05/17/18  Lactobacillus started(was on Augmentin previously)  GI ZSM:OLMBEM vent requirement going away  protein malnutrition- with protein supplementation    Renal/FEN:   LJ:QGBE Monitor Uo,  IVFS:none    Heme:Ac Blood Loss Anemia-asx    ID: PNA treatment    Skin/Musculoskeletal:  Extremities:humeral fx- nonop per ortho  Breakdown:scattered abrasions. No breakdown  continue local wound care  DVT XNA:TFTD, Lovenox  Therapies: PT/OT/Speech  ActivityNWB to upper extremity with fracture  Disposition:cont ICU  Code Status:DNR. no escalation. Family now requesting no labs or diagnositics.  Family meeting anticipated today.     Critical Care Attestation  Patient withAcute hypoxic/hyercarbic Respiratory insufficiency/Failure requiring frequent monitoring and assessment including including aggressive pulmonary hygiene.Continue to wean BIPAP with goal to ATC increasing. Abx with new consolidation and Fever. Family has limited treatment options. Will optimize as best able until further clarification.  This patient suffers from failure or dysfunction of Neurologic/Sensory, Respiratory, Cardiovascular, GI/Hepatopancreaticobiliary, Infectious/Hematologic and Musculoskeletalsystem(s).     I was present at the bedside of this critically ill  patient exclusive of procedures.The care of this patient was in regard to managing (a) conditions(s) that has a high probability of sudden, clinically significant, or life-threatening deterioration and required a high degree of Attending Physician attention and direct involvement to intervene urgently.   Data review and care planning was performed in direct proximity of the patient, examination was obviously performed in direct contact with the patient. All of this time was exclusive of procedure which will be documented elsewhere in the chart.    My critical care time is independent and unique to other providers (no other providers saw patient for purposes of SICU evaluation)  My critical care time involved full attention to the patients'condition and included:    Review of nursing notes and/or old charts  Review of medications, allergies, and vital signs  Documentation time  Consultant collaboration on findings and treatment options  Care, transfer of care, and discharge plans  Ordering, interpreting, and reviewing diagnostic studies/tab tests  Obtaining necessary history from family, EMS, nursing home staff and/or treating physicians    My critical care time did not include time spent teaching resident physician(s) or other services of resident physicians, or performing other reported procedures.    Total Critical Care Time:56mnutes    Electronically Signed by:    CTawana ScaleMD, MS  Assistant Professor of Surgery  Trauma, Surgical Critical Care, and AFairfax 05/19/2018 07:54'

## 2018-05-19 NOTE — Respiratory Therapy (Signed)
Patient was on ATC from 0620-0920.  Patient placed back on BIPAP due to increased work of breathing, copious amounts of secretions.  Patient tolerating well back on BIPAP.

## 2018-05-19 NOTE — Nurses Notes (Signed)
Transferred to 7W 725. Called [name] pt's HCS. Belongings with patient.

## 2018-05-19 NOTE — Progress Notes (Signed)
[facility]                                            Trauma Progress Note                 Date of Birth:  [birth year]  Date of Admission:  04/28/2018  Date of service: 05/19/2018    Antonio Gillespie, 58 y.o., male Post trauma day 21 s/p fall.     Subjective:    Resting in bed , pleasantly delirious.       Objective   24 Hour Summary:    Filed Vitals:    05/19/18 1300 05/19/18 1400 05/19/18 1500 05/19/18 1601   BP: 125/82 117/72 122/83 121/76   Pulse: 96 100 (!) 102 100   Resp: 20 (!) 28 (!) 21 20   Temp:    36.7 C (98.1 F)   SpO2: 93% (!) 89% 90% 100%     Labs:  Recent Labs     05/17/18  0044 05/18/18  0503   WBC 6.5 8.2   HGB 7.4* 8.1*   HCT 25.4* 28.2*   SODIUM 143 143   POTASSIUM 3.2* 3.0*   CHLORIDE 106 101   BUN 6* 6*   CREATININE 0.51* 0.52*   ANIONGAP 6 6   CALCIUM 7.5* 7.9*   MAGNESIUM 1.9 2.1   PHOSPHORUS 3.7 3.7       Intake/Output Summary (Last 24 hours) at 05/19/2018 1638  Last data filed at 05/19/2018 1200  Gross per 24 hour   Intake 1450 ml   Output 150 ml   Net 1300 ml     Nutrition Management: DIET REGULAR Last Bowel Movement: 05/19/18  No results for input(s): ALBUMIN, PREALBUMIN in the last 72 hours.  Current Medications:    Current Facility-Administered Medications:   .  acetaminophen (TYLENOL) 160 mg per 5 mL liquid - grape flavored, 650 mg, Gastric (NG, OG, PEG, GT), Q4H PRN, Hudik, Rachel, APRN,NP-C  .  glycopyrrolate (ROBINUL) 0.2 mg/mL (200 mcg/mL) injection, 200 mcg, Intravenous, Q4H PRN, Hudik, Rachel, APRN,NP-C  .  guaiFENesin (ROBITUSSIN) 100mg  per 50mL oral liquid - for cough (expectorant), 10 mL, Gastric (NG, OG, PEG, GT), Q6H, Kotecki, Ryan, DO, 10 mL at 05/19/18 1320  .  LORazepam (ATIVAN) 2 mg/mL injection, 1 mg, Intravenous, Q4H PRN, Hudik, Rachel, APRN,NP-C  .  metoprolol tartrate (LOPRESSOR) tablet, 12.5 mg, Gastric (NG, OG, PEG, GT), 2x/day, Michaelle Birks, MD, 12.5 mg at 05/19/18 0837  .  morphine concentrate (ROXANOL) 10 mg per 0.5 mL oral liquid, 2.6 mg, Oral, Q4H PRN,  Hudik, Rachel, APRN,NP-C  .  INSERT & MAINTAIN PERIPHERAL IV ACCESS, , , UNTIL DISCONTINUED **AND** DRESSING CHANGE Saline Lock; DRY STERILE, , , PRN **AND** NS flush syringe, 2 mL, Intracatheter, Q8HRS, Stopped at 05/19/18 1400 **AND** NS flush syringe, 2-6 mL, Intracatheter, Q1 MIN PRN, Carlynn Herald, MD, 2 mL at 05/17/18 1358  .  scopolamine 1 mg over 3 days transdermal patch, 1 Patch, Transdermal, Q72H, Michaelle Birks, MD    Today's Physical Exam:  GEN:   NAD and Appears chronically Ill  NECK:   Trach in place,  PULM:   on BiPap  CV:   Regular rate and rhythm  ABD:   Distended, PEG in place.  NEURO:  Awakens to stimuli.   Integumentary:  Pink, warm, and dry. Scattered dry wounds  over lower extremities and left upper lip    Assessment/ Plan:   Active Hospital Problems   (*Primary Problem)    Diagnosis   . *Fall     "found down" and taken to outside hospital     . Altered mental status, unspecified   . Atrial fibrillation (CMS HCC)   . Failure to wean from mechanical ventilation (CMS HCC)   . Anemia   . COPD (chronic obstructive pulmonary disease) (CMS HCC)     Chronic   . Thrombocytopenia Unspecified   . Hypokalemia   . Alcohol abuse   . Acute respiratory failure (CMS HCC)   . Nasal fracture     Acute vs chronic     . Abrasions of multiple sites   . Scalp hematoma   . Humeral fracture     nonop per ortho     . Left inguinal hernia     S/p reduction     . Anasarca     DVT prophylaxis:  Enoxaparin and SCDs/ Venodynes/Impulse boots  Anticoagulants (last 24 hours)     Date/Time Action Medication Dose    05/10/18 2106 Given    enoxaparin PF (LOVENOX) 60 mg/0.6 mL SubQ injection 50 mg    05/10/18 6553 Given    enoxaparin PF (LOVENOX) 60 mg/0.6 mL SubQ injection 50 mg        Nutrition: DIET REGULAR diet, Last Bowel Movement: 05/19/18  Activity: OOB to chair   RUE nonweight bearing  Pain:   Analgesics (last 24 hours)     Date/Time Action Medication Dose    05/11/18 0511 Given    acetaminophen (TYLENOL) 160 mg per 5 mL  liquid - grape flavored 650 mg    05/11/18 0044 Given    acetaminophen (TYLENOL) 160 mg per 5 mL liquid - grape flavored 650 mg        Plan:  -To be made comfort measures only today. Comfort measures orders to be placed by supportive care.     Juanell Fairly, MD  05/19/2018, 16:38      I saw and examined the patient.  I reviewed the resident's note.  I agree with the findings and plan of care as documented in the resident's note.  Any exceptions/additions are edited/noted.    Babs Sciara, MD

## 2018-05-20 MED ORDER — GLYCOPYRROLATE 0.2 MG/ML INJECTION SOLUTION
200.0000 ug | Freq: Three times a day (TID) | INTRAMUSCULAR | Status: DC
Start: 2018-05-20 — End: 2018-05-25
  Administered 2018-05-20 – 2018-05-21 (×4): 200 ug via INTRAVENOUS
  Administered 2018-05-21: 0 ug via INTRAVENOUS
  Administered 2018-05-22 – 2018-05-25 (×11): 200 ug via INTRAVENOUS
  Filled 2018-05-20 (×15): qty 1

## 2018-05-20 MED ORDER — ATROPINE 1 % EYE DROPS - FOR SUBLINGUAL USE
2.0000 [drp] | SUBLINGUAL | Status: DC | PRN
Start: 2018-05-20 — End: 2018-05-25
  Administered 2018-05-20 – 2018-05-24 (×6): 2 [drp] via SUBLINGUAL
  Filled 2018-05-20: qty 2

## 2018-05-20 MED ADMIN — heparin lock flush (porcine) 10 unit/mL intravenous solution: @ 22:00:00

## 2018-05-20 MED ADMIN — glycopyrrolate 0.2 mg/mL injection solution: INTRAVENOUS | @ 21:00:00

## 2018-05-20 MED ADMIN — guaiFENesin 100 mg/5 mL oral liquid: GASTROSTOMY | @ 18:00:00

## 2018-05-20 MED ADMIN — sodium chloride 0.9 % (flush) injection syringe: @ 05:00:00

## 2018-05-20 MED ADMIN — guaiFENesin 100 mg/5 mL oral liquid: GASTROSTOMY | @ 05:00:00

## 2018-05-20 MED ADMIN — dexAMETHasone sodium phosphate 10 mg/mL injection solution: INTRAVENOUS | @ 06:00:00

## 2018-05-20 MED ADMIN — atropine 1 % eye drops: SUBLINGUAL | @ 13:00:00

## 2018-05-20 MED ADMIN — morphine concentrate 10 mg/0.5 mL oral syringe (FOR ORAL USE ONLY): ORAL | @ 09:00:00

## 2018-05-20 MED ADMIN — metoprolol tartrate 25 mg tablet: GASTROSTOMY | @ 21:00:00

## 2018-05-20 MED ADMIN — glycopyrrolate 0.2 mg/mL injection solution: INTRAVENOUS | @ 08:00:00

## 2018-05-20 MED ADMIN — DOBUTamine 1,000 mg/250 mL(4,000 mcg/mL) in 5 % dextrose IV: INTRAVENOUS | @ 13:00:00

## 2018-05-20 MED ADMIN — phenylephrine 10 mg/mL injection solution: GASTROSTOMY | @ 09:00:00

## 2018-05-20 MED ADMIN — oxyCODONE 5 mg tablet: @ 14:00:00

## 2018-05-20 MED ADMIN — heparin lock flush (porcine) 10 unit/mL intravenous solution: GASTROSTOMY | @ 23:00:00

## 2018-05-20 NOTE — Care Plan (Signed)
Problem: Adult Inpatient Plan of Care  Goal: Plan of Care Review  Outcome: Ongoing (see interventions/notes)  Goal: Patient-Specific Goal (Individualized)  Outcome: Ongoing (see interventions/notes)  Flowsheets (Taken 05/20/2018 1401)  Individualized Care Needs: family called said he likes water  Anxieties, Fears or Concerns: he is restless,undetermined,medication available  Patient-Specific Goals (Include Timeframe): family wants him comfortable  Goal: Absence of Hospital-Acquired Illness or Injury  Outcome: Ongoing (see interventions/notes)  Goal: Optimal Comfort and Wellbeing  Outcome: Ongoing (see interventions/notes)  Goal: Rounds/Family Conference  Outcome: Ongoing (see interventions/notes)     Problem: Adult Inpatient Plan of Care  Goal: Patient-Specific Goal (Individualized)  Outcome: Ongoing (see interventions/notes)  Flowsheets (Taken 05/20/2018 1401)  Individualized Care Needs: family called said he likes water  Anxieties, Fears or Concerns: he is restless,undetermined,medication available  Patient-Specific Goals (Include Timeframe): family wants him comfortable     Problem: Skin Injury Risk Increased  Goal: Skin Health and Integrity  Outcome: Ongoing (see interventions/notes)     Problem: Fall Injury Risk  Goal: Absence of Fall and Fall-Related Injury  Outcome: Ongoing (see interventions/notes)     Problem: Adult Inpatient Plan of Care  Goal: Absence of Hospital-Acquired Illness or Injury  Outcome: Ongoing (see interventions/notes)     Problem: Adult Inpatient Plan of Care  Goal: Optimal Comfort and Wellbeing  Outcome: Ongoing (see interventions/notes)     Problem: Device-Related Complication Risk (Artificial Airway)  Goal: Optimal Device Function  Outcome: Ongoing (see interventions/notes)     Problem: Communication Impairment (Artificial Airway)  Goal: Effective Communication  Outcome: Ongoing (see interventions/notes)     Problem: Skin and Tissue Injury (Artificial Airway)  Goal: Absence of  Device-Related Skin or Tissue Injury  Outcome: Ongoing (see interventions/notes)     Problem: End-of-Life Care  Goal: Comfort, Peace and Preserved Dignity  Outcome: Ongoing (see interventions/notes)   Patient CMO status. pain score evaluation and treated, restlessness treated. Tracheostomy with excessive secretion this AM decreased with medication intervention. Emotional support offered. Skin protection interventions maintained as well as fall prevention. Family calls addressed. Plan of care in collaboration with A Kien RN CN.

## 2018-05-20 NOTE — Care Plan (Signed)
Plan of care reviewed. Pt alert and cooperative with care this shift. Trach care provided. Pt refusing suctioning. Assessment per doc flow. Pt free from falls this shift. Needs met with hourly rounding. Pt resting at this time with call bell in reach. Wheels locked and bed in lowest position. Will continue to monitor.     Problem: Adult Inpatient Plan of Care  Goal: Plan of Care Review  Outcome: Ongoing (see interventions/notes)  Goal: Patient-Specific Goal (Individualized)  Outcome: Ongoing (see interventions/notes)  Goal: Absence of Hospital-Acquired Illness or Injury  Outcome: Ongoing (see interventions/notes)  Goal: Optimal Comfort and Wellbeing  Outcome: Ongoing (see interventions/notes)  Goal: Rounds/Family Conference  Outcome: Ongoing (see interventions/notes)     Problem: Fall Injury Risk  Goal: Absence of Fall and Fall-Related Injury  Outcome: Ongoing (see interventions/notes)     Problem: Skin Injury Risk Increased  Goal: Skin Health and Integrity  Outcome: Ongoing (see interventions/notes)     Problem: Communication Impairment (Artificial Airway)  Goal: Effective Communication  Outcome: Ongoing (see interventions/notes)     Problem: Device-Related Complication Risk (Artificial Airway)  Goal: Optimal Device Function  Outcome: Ongoing (see interventions/notes)     Problem: Skin and Tissue Injury (Artificial Airway)  Goal: Absence of Device-Related Skin or Tissue Injury  Outcome: Ongoing (see interventions/notes)     Leslye Peer, RN  05/20/2018, 02:25

## 2018-05-20 NOTE — Nurses Notes (Signed)
Pt had copious amounts of secretions coming from trach. Pt starting to become increasingly more agitated. PRN Ativan given and trach suctioned. Repositioned and cleaned pt up. Pt resting comfortably now. Will continue to monitor.

## 2018-05-20 NOTE — Nurses Notes (Signed)
Administration Information   atropine (ISOPTO ATROPINE) 1% ophthalmic solution - for SUBLINGUAL use    Action Reason Dose/Rate Route Site Linked Line Action Time Recorded Time    Given N/A 2 Drop Sublingual   05/20/18 1236 05/20/18 1236       Administering User: Lilian Kapur, Sabreen Kitchen E, LPN     Trach secretions are excessive, thin cloudy yellow gray foul smelling.

## 2018-05-20 NOTE — Nurses Notes (Signed)
Administration Information   LORazepam (ATIVAN) 2 mg/mL injection    Action Reason Dose/Rate Route Site Linked Line Action Time Recorded Time    Given N/A 1 mg Intravenous  Midline Double Lumen Lumen 1 Red Lumen 2 Purple Left;Basilic Vein NOT A CENTRAL LINE (Lumen 1) 05/20/18 1252 05/20/18 1252       Administering User: Christia Reading, RN   Ativan given, patient was restless, putting legs over side of bed.

## 2018-05-20 NOTE — Ancillary Notes (Signed)
SBIRT    SBIRT not completed during hospitalization because per documentation, pt's plan of care is comfort measures only. Pending recommendations:  None.      Wyn Forster, LGSW , Clinical Therapist 05/20/2018, 08:52      Pager  [phone]

## 2018-05-20 NOTE — Progress Notes (Signed)
Family Phone call    The designated family member was able to be reached by phone for updates.  An update of the patient's status was reviewed and all questions were answered.    Juanell Fairly, MD  05/20/2018, 11:29

## 2018-05-20 NOTE — Progress Notes (Signed)
[facility]                                            Trauma Progress Note                 Date of Birth:  1960-05-02  Date of Admission:  04/28/2018  Date of service: 05/20/2018    Antonio Gillespie, 58 y.o., male Post trauma day 22 s/p fall.     Subjective:    Resting in bed , pleasantly delirious.       Objective   24 Hour Summary:    Filed Vitals:    05/19/18 1500 05/19/18 1601 05/19/18 2034 05/20/18 0932   BP: 122/83 121/76 118/67 (!) 99/55   Pulse: (!) 102 100 (!) 109 (!) 115   Resp: (!) 21 20 18  (!) 22   Temp:  36.7 C (98.1 F) 36.7 C (98.1 F) 37.6 C (99.7 F)   SpO2: 90% 100% (!) 84% 100%     Labs:  Recent Labs     05/18/18  0503   WBC 8.2   HGB 8.1*   HCT 28.2*   SODIUM 143   POTASSIUM 3.0*   CHLORIDE 101   BUN 6*   CREATININE 0.52*   ANIONGAP 6   CALCIUM 7.9*   MAGNESIUM 2.1   PHOSPHORUS 3.7       Intake/Output Summary (Last 24 hours) at 05/20/2018 1101  Last data filed at 05/20/2018 0949  Gross per 24 hour   Intake 140 ml   Output --   Net 140 ml     Nutrition Management: DIET REGULAR Last Bowel Movement: 05/20/18  No results for input(s): ALBUMIN, PREALBUMIN in the last 72 hours.  Current Medications:    Current Facility-Administered Medications:   .  acetaminophen (TYLENOL) 160 mg per 5 mL liquid - grape flavored, 650 mg, Gastric (NG, OG, PEG, GT), Q4H PRN, Hudik, Rachel, APRN,NP-C  .  atropine (ISOPTO ATROPINE) 1% ophthalmic solution - for SUBLINGUAL use, 2 Drop, Sublingual, Q1H PRN, Juanell Fairly, MD  .  glycopyrrolate (ROBINUL) 0.2 mg/mL (200 mcg/mL) injection, 200 mcg, Intravenous, Q4H PRN, Hudik, Rachel, APRN,NP-C, 200 mcg at 05/20/18 0828  .  glycopyrrolate (ROBINUL) 0.2 mg/mL (200 mcg/mL) injection, 200 mcg, Intravenous, Q8H, Juanell Fairly, MD  .  guaiFENesin (ROBITUSSIN) 100mg  per 26mL oral liquid - for cough (expectorant), 10 mL, Gastric (NG, OG, PEG, GT), Q6H, Kotecki, Ryan, DO, 10 mL at 05/20/18 0529  .  LORazepam (ATIVAN) 2 mg/mL injection, 1 mg, Intravenous, Q4H PRN, Hudik, Rachel,  APRN,NP-C, 1 mg at 05/20/18 0546  .  metoprolol tartrate (LOPRESSOR) tablet, 12.5 mg, Gastric (NG, OG, PEG, GT), 2x/day, Michaelle Birks, MD, 12.5 mg at 05/20/18 0915  .  morphine concentrate (ROXANOL) 10 mg per 0.5 mL oral liquid, 2.6 mg, Oral, Q4H PRN, Hudik, Rachel, APRN,NP-C, 2.6 mg at 05/20/18 0911  .  INSERT & MAINTAIN PERIPHERAL IV ACCESS, , , UNTIL DISCONTINUED **AND** DRESSING CHANGE Saline Lock; DRY STERILE, , , PRN **AND** NS flush syringe, 2 mL, Intracatheter, Q8HRS, 2 mL at 05/20/18 0529 **AND** NS flush syringe, 2-6 mL, Intracatheter, Q1 MIN PRN, Carlynn Herald, MD, 2 mL at 05/17/18 1358  .  scopolamine 1 mg over 3 days transdermal patch, 1 Patch, Transdermal, Q72H, Michaelle Birks, MD    Today's Physical Exam:  GEN:   NAD and Appears chronically  Ill  NECK:   Trach in place,  PULM:   on ATC.   CV:   Regular rate and rhythm  ABD:   Distended, PEG in place.  NEURO:  Awakens to stimuli.   Integumentary:  Pink, warm, and dry. Scattered dry wounds over lower extremities and left upper lip    Assessment/ Plan:   Active Hospital Problems   (*Primary Problem)    Diagnosis   . *Fall     "found down" and taken to outside hospital     . Altered mental status, unspecified   . Atrial fibrillation (CMS HCC)   . Failure to wean from mechanical ventilation (CMS HCC)   . Anemia   . COPD (chronic obstructive pulmonary disease) (CMS HCC)     Chronic   . Thrombocytopenia Unspecified   . Hypokalemia   . Alcohol abuse   . Acute respiratory failure (CMS HCC)   . Nasal fracture     Acute vs chronic     . Abrasions of multiple sites   . Scalp hematoma   . Humeral fracture     nonop per ortho     . Left inguinal hernia     S/p reduction     . Anasarca     DVT prophylaxis:  Enoxaparin and SCDs/ Venodynes/Impulse boots  Anticoagulants (last 24 hours)     Date/Time Action Medication Dose    05/10/18 2106 Given    enoxaparin PF (LOVENOX) 60 mg/0.6 mL SubQ injection 50 mg    05/10/18 0826 Given    enoxaparin PF (LOVENOX) 60 mg/0.6  mL SubQ injection 50 mg        Nutrition: DIET REGULAR diet, Last Bowel Movement: 05/20/18  Activity: OOB to chair   RUE nonweight bearing  Pain:   Analgesics (last 24 hours)     Date/Time Action Medication Dose    05/11/18 0511 Given    acetaminophen (TYLENOL) 160 mg per 5 mL liquid - grape flavored 650 mg    05/11/18 0044 Given    acetaminophen (TYLENOL) 160 mg per 5 mL liquid - grape flavored 650 mg        Plan:  -Continue comfort measures, supportive care recommendations added to orders.     Late entry for 05/20/18. I saw and examined the patient.  I reviewed the resident's note.  I agree with the findings and plan of care as documented in the resident's note.  Any exceptions/additions are edited/noted.    Babs Sciara, MD

## 2018-05-21 MED ADMIN — glycopyrrolate 0.2 mg/mL injection solution: INTRAVENOUS | @ 11:00:00

## 2018-05-21 MED ADMIN — morphine concentrate 10 mg/0.5 mL oral syringe (FOR ORAL USE ONLY): ORAL | @ 17:00:00

## 2018-05-21 MED ADMIN — guaiFENesin 100 mg/5 mL oral liquid: GASTROSTOMY | @ 23:00:00

## 2018-05-21 MED ADMIN — sodium chloride 0.9 % (flush) injection syringe: @ 05:00:00

## 2018-05-21 MED ADMIN — glycopyrrolate 0.2 mg/mL injection solution: INTRAVENOUS | @ 12:00:00

## 2018-05-21 MED ADMIN — guaiFENesin 100 mg/5 mL oral liquid: GASTROSTOMY | @ 17:00:00

## 2018-05-21 MED ADMIN — scopolamine 1 mg over 3 days transdermal patch: TRANSDERMAL | @ 01:00:00

## 2018-05-21 MED ADMIN — sodium chloride 0.9 % (flush) injection syringe: @ 14:00:00

## 2018-05-21 MED ADMIN — sodium chloride 0.9 % (flush) injection syringe: INTRAVENOUS | @ 05:00:00

## 2018-05-21 MED ADMIN — inhalational spacing device: @ 21:00:00

## 2018-05-21 MED ADMIN — acetaminophen 325 mg tablet: INTRAVENOUS | @ 11:00:00

## 2018-05-21 MED ADMIN — EPINEPHRINE IN NS IRRIGATION: GASTROSTOMY | @ 05:00:00 | NDC 00548906100

## 2018-05-21 MED ADMIN — TOBRAMYCIN IVPB ALTERNATIVE DOSING: GASTROSTOMY | @ 11:00:00

## 2018-05-21 NOTE — Care Plan (Signed)
Patient alert and confused x4. Trach and incontinence care provided. Scheduled Robinul, Scopolamine patch, Robitussin, and PRN atropine for excessive secretions. Medications given through PEG tube. Patency monitored. Turned and repositioned for comfort. SS in place. Will continue to monitor.     Problem: Adult Inpatient Plan of Care  Goal: Plan of Care Review  Outcome: Ongoing (see interventions/notes)  Goal: Patient-Specific Goal (Individualized)  Outcome: Ongoing (see interventions/notes)  Goal: Absence of Hospital-Acquired Illness or Injury  Outcome: Ongoing (see interventions/notes)  Goal: Optimal Comfort and Wellbeing  Outcome: Ongoing (see interventions/notes)  Goal: Rounds/Family Conference  Outcome: Ongoing (see interventions/notes)     Problem: Fall Injury Risk  Goal: Absence of Fall and Fall-Related Injury  Outcome: Ongoing (see interventions/notes)     Problem: Skin Injury Risk Increased  Goal: Skin Health and Integrity  Outcome: Ongoing (see interventions/notes)     Problem: Communication Impairment (Artificial Airway)  Goal: Effective Communication  Outcome: Ongoing (see interventions/notes)     Problem: Device-Related Complication Risk (Artificial Airway)  Goal: Optimal Device Function  Outcome: Ongoing (see interventions/notes)     Problem: Skin and Tissue Injury (Artificial Airway)  Goal: Absence of Device-Related Skin or Tissue Injury  Outcome: Ongoing (see interventions/notes)     Problem: End-of-Life Care  Goal: Comfort, Peace and Preserved Dignity  Outcome: Ongoing (see interventions/notes)     Lorelei Pont, RN  05/21/2018, 03:13

## 2018-05-21 NOTE — Progress Notes (Signed)
[facility]                                            Trauma Progress Note                 Date of Birth:  26-May-1960  Date of Admission:  04/28/2018  Date of service: 05/21/2018    Antonio Gillespie, 58 y.o., male Post trauma day 23 s/p fall.     Subjective:    Resting in bed , pleasantly delirious.       Objective   24 Hour Summary:    Filed Vitals:    05/20/18 1636 05/20/18 2107 05/21/18 1004 05/21/18 1403   BP:  117/74 (!) 148/80    Pulse:  (!) 117 (!) 116    Resp: 20 (!) 28 16    Temp:  37.4 C (99.3 F) (!) 38.1 C (100.6 F) 36.9 C (98.4 F)   SpO2:  100% 97%      Labs:  No results for input(s): WBC, BANDS, HGB, HCT, SODIUM, POTASSIUM, CHLORIDE, BICARBONATE, BUN, CREATININE, GLUCOSE, ANIONGAP, CALCIUM, MAGNESIUM, PHOSPHORUS, INR in the last 72 hours.    Invalid input(s): PLATELET, PTT, PT    Intake/Output Summary (Last 24 hours) at 05/21/2018 1513  Last data filed at 05/21/2018 1400  Gross per 24 hour   Intake 70 ml   Output --   Net 70 ml     Nutrition Management: DIET REGULAR Last Bowel Movement: 05/20/18  No results for input(s): ALBUMIN, PREALBUMIN in the last 72 hours.  Current Medications:    Current Facility-Administered Medications:   .  acetaminophen (TYLENOL) 160 mg per 5 mL liquid - grape flavored, 650 mg, Gastric (NG, OG, PEG, GT), Q4H PRN, Hudik, Rachel, APRN,NP-C, 650 mg at 05/21/18 1039  .  atropine (ISOPTO ATROPINE) 1% ophthalmic solution - for SUBLINGUAL use, 2 Drop, Sublingual, Q1H PRN, Juanell Fairly, MD, 2 Drop at 05/20/18 1741  .  glycopyrrolate (ROBINUL) 0.2 mg/mL (200 mcg/mL) injection, 200 mcg, Intravenous, Q4H PRN, Hudik, Rachel, APRN,NP-C, 200 mcg at 05/21/18 1039  .  glycopyrrolate (ROBINUL) 0.2 mg/mL (200 mcg/mL) injection, 200 mcg, Intravenous, Q8H, Juanell Fairly, MD, Stopped at 05/21/18 1200  .  guaiFENesin (ROBITUSSIN) 100mg  per 30mL oral liquid - for cough (expectorant), 10 mL, Gastric (NG, OG, PEG, GT), Q6H, Kotecki, Ryan, DO, 10 mL at 05/21/18 1040  .  LORazepam (ATIVAN) 2 mg/mL  injection, 1 mg, Intravenous, Q4H PRN, Hudik, Rachel, APRN,NP-C, 1 mg at 05/21/18 1038  .  metoprolol tartrate (LOPRESSOR) tablet, 12.5 mg, Gastric (NG, OG, PEG, GT), 2x/day, Michaelle Birks, MD, 12.5 mg at 05/21/18 1039  .  morphine concentrate (ROXANOL) 10 mg per 0.5 mL oral liquid, 2.6 mg, Oral, Q4H PRN, Hudik, Rachel, APRN,NP-C, 2.6 mg at 05/21/18 1042  .  INSERT & MAINTAIN PERIPHERAL IV ACCESS, , , UNTIL DISCONTINUED **AND** DRESSING CHANGE Saline Lock; DRY STERILE, , , PRN **AND** NS flush syringe, 2 mL, Intracatheter, Q8HRS, 2 mL at 05/21/18 0431 **AND** NS flush syringe, 2-6 mL, Intracatheter, Q1 MIN PRN, Carlynn Herald, MD, 2 mL at 05/17/18 1358  .  scopolamine 1 mg over 3 days transdermal patch, 1 Patch, Transdermal, Q72H, Michaelle Birks, MD, 1 Patch at 05/21/18 0036    Today's Physical Exam:  GEN:   NAD and Appears chronically Ill  NECK:   Trach in place,  PULM:  on ATC.   CV:   Regular rate and rhythm  ABD:   Distended, PEG in place.  NEURO:  Awakens to stimuli.   Integumentary:  Pink, warm, and dry. Scattered dry wounds over lower extremities and left upper lip    Assessment/ Plan:   Active Hospital Problems   (*Primary Problem)    Diagnosis   . *Fall     "found down" and taken to outside hospital     . Altered mental status, unspecified   . Atrial fibrillation (CMS HCC)   . Failure to wean from mechanical ventilation (CMS HCC)   . Anemia   . COPD (chronic obstructive pulmonary disease) (CMS HCC)     Chronic   . Thrombocytopenia Unspecified   . Hypokalemia   . Alcohol abuse   . Acute respiratory failure (CMS HCC)   . Nasal fracture     Acute vs chronic     . Abrasions of multiple sites   . Scalp hematoma   . Humeral fracture     nonop per ortho     . Left inguinal hernia     S/p reduction     . Anasarca     DVT prophylaxis:  Enoxaparin and SCDs/ Venodynes/Impulse boots  Anticoagulants (last 24 hours)     Date/Time Action Medication Dose    05/10/18 2106 Given    enoxaparin PF (LOVENOX) 60 mg/0.6 mL  SubQ injection 50 mg    05/10/18 0826 Given    enoxaparin PF (LOVENOX) 60 mg/0.6 mL SubQ injection 50 mg        Nutrition: DIET REGULAR diet, Last Bowel Movement: 05/20/18  Activity: OOB to chair   RUE nonweight bearing  Pain:   Analgesics (last 24 hours)     Date/Time Action Medication Dose    05/11/18 0511 Given    acetaminophen (TYLENOL) 160 mg per 5 mL liquid - grape flavored 650 mg    05/11/18 0044 Given    acetaminophen (TYLENOL) 160 mg per 5 mL liquid - grape flavored 650 mg        Plan:  -Continue comfort measures, supportive care recommendations added to orders.     Juanell Fairly, MD  05/21/2018, 15:13      I saw and examined the patient.  I reviewed the resident's note.  I agree with the findings and plan of care as documented in the resident's note.  Any exceptions/additions are edited/noted.    Babs Sciara, MD

## 2018-05-21 NOTE — Care Plan (Addendum)
CMO status. Less active in bed today, less restless. Trach secretions decreased. Continued Comfort Measures. Brother in for visit.   Taken 05/21/2018 0800  Individualized Care Needs: comfort measures  Anxieties, Fears or Concerns: frustrated with trach care now  Taken 05/20/2018 1401  Patient-Specific Goals (Include Timeframe): family wants him comfortable

## 2018-05-22 DIAGNOSIS — Z515 Encounter for palliative care: Secondary | ICD-10-CM

## 2018-05-22 MED ORDER — MORPHINE CONCENTRATE 10 MG/0.5 ML ORAL SYRINGE (FOR ORAL USE ONLY)
2.50 mg | INJECTION | ORAL | Status: DC | PRN
Start: 2018-05-22 — End: 2018-05-25
  Administered 2018-05-22 – 2018-05-24 (×5): 2.6 mg via GASTROSTOMY
  Administered 2018-05-24: 05:00:00 0 mg via GASTROSTOMY
  Administered 2018-05-25: 2.6 mg via GASTROSTOMY
  Filled 2018-05-22 (×7): qty 0.5

## 2018-05-22 MED ADMIN — LORazepam 2 mg/mL injection solution: INTRAVENOUS | @ 10:00:00

## 2018-05-22 MED ADMIN — atropine 1 % eye drops: SUBLINGUAL | @ 17:00:00

## 2018-05-22 MED ADMIN — sodium chloride 0.9 % (flush) injection syringe: @ 14:00:00

## 2018-05-22 MED ADMIN — sodium chloride 0.9 % (flush) injection syringe: SUBLINGUAL | @ 09:00:00

## 2018-05-22 MED ADMIN — sodium chloride 0.9 % (flush) injection syringe: GASTROSTOMY | @ 12:00:00

## 2018-05-22 MED ADMIN — VANCOMYCIN IV - PHARMACIST TO DOSE PER PROTOCOL: @ 21:00:00

## 2018-05-22 MED ADMIN — sodium chloride 0.9 % (flush) injection syringe: GASTROSTOMY | @ 04:00:00

## 2018-05-22 MED ADMIN — sodium chloride 0.9 % (flush) injection syringe: INTRAVENOUS | @ 10:00:00

## 2018-05-22 MED ADMIN — sodium chloride 0.9 % intravenous solution: GASTROSTOMY | @ 10:00:00

## 2018-05-22 MED ADMIN — rocuronium 10 mg/mL intravenous solution: GASTROSTOMY

## 2018-05-22 MED ADMIN — sodium chloride 0.9 % (flush) injection syringe: GASTROSTOMY | @ 09:00:00

## 2018-05-22 NOTE — Consults (Signed)
[facility]  Supportive/Palliative Care Medicine Consult  Follow Up Note    Antonio ScrewsMoore, Josten E, 58 y.o. male  Date of Service: 05/22/2018  Date of Birth:  06-Feb-1961    Hospital Day:  LOS: 24 days     Assessment/Recommendations: Encounter for palliative care. Adult Failure to Thrive secondary to acute hypoxic/hypercarbic respiratory failure s/p tracheostomy placement, hospital acquired pneumonia.    I spoke with pt's brother/HCS [name]([name]) via telephone and updated him on plan of care/pt's status. Also spoke with care management. Planning for possible discharge to SNF with hospice.     POST form discussed and completed with the following: DNR, CMO, No IVF, feeding tube long-term.    Continue the following for symptom management:  1. Roxanol 2.5 mg gastric Q4H PRN pain, dyspnea.  2. Tylenol 650 mg gastric Q4H PRN pain, fever.  3. Robinul 200 mcg IV scheduled Q8H with 200 mcg IV Q4H PRN excessive secretions.  4. Ativan 1 mg IV Q4H PRN agitation, restlessness, myoclonic jerking.  5. Scopolamine patch.  6. Atropine 2 drops sublingual Q1H PRN excessive secretions.       Plan of care was communicated with primary team.     Thank you for this consult. Supportive care will sign off. Please contact us if we can be of further assistance.      Pain: PainAD  0    POST completed:  POST form discussed and completed this admission     Subjective: Pt's O2 sats 92% on 40% FiO2 ATC. No fevers. Nursing reports giving pt Atropine and Robinul for excess secretions. Spoke with pt's brother/HCS [name]([name]) who states he was able to come visit pt yesterday. Nadine CountsBob was upset that friend of the family Corrie Dandy(Mary) was unable to come visit as well.    Objective:  Temperature: 37.1 C (98.8 F)  Heart Rate: (!) 108  BP (Non-Invasive): 107/70  Respiratory Rate: 18  SpO2: 92 %  Pain Score (Numeric, Faces): Other  Physical Exam:  General: appears acutely ill and no distress  Eyes: Conjunctiva clear. L pupil 6 mm, round, sluggish. R pupil 5 mm, round,  fixed.  ENT: Mouth mucous membranes dry, intact. Poor dentition. Trach present.  Neck: supple, symmetrical, trachea midline. Trach present.   Lungs: Abdominal muscle use. CTAB. No wheezes, rales, or rhonchi.  Cardiovascular: regular rate and rhythm, S1, S2 normal, no murmur, click, rub or gallop. 2+ edema to bilateral hands. +2 R radial pulse.   Abdomen: Soft, non-tender, Bowel sounds normal, non-distended  Musculoskeletal: Head atraumatic and normocephalic  Skin: Skin warm and dry, No overt rashes   Neurologic: Alert. Oriented to person.  Psychiatric: restless   Other symptoms present: Delirium, excessive tracheal secretions       Labs:    Lab Results Today:    No results found for any visits on 04/28/18 (from the past 24 hour(s)).    Ventilator Settings: N/A  HFNC Settings: N/A  BIPAP Settings: $ O2 Delivery: ATC  Oxygen Concentration (%): 40  Flow (L/min): 10      Imaging Studies:   I have reviewed all pertinent radiology. No new.    Patient/Family Meeting held, present were: pt's brother/HCS [name]([name]), Christen Bame. Hudik, FNP    Discussion with patient and family included: diagnoses, current problems, prognosis of days to weeks, treatment limitations, treatment options, hospice, pt/family goals for care and other symptom management    Location of pt/family meeting:  on telephone due to [facility] and family unable to visit  Outcome of pt/family meeting: Comfort measures only. No CPR, Do Not Intubate, No vasopressors, No escalation of care, and No ICU transfer, No antibiotics, No BiPAP, No dialysis, No hyperalimentation, No IVF, No lab studies, No routine diagnostic tests, No tube feedings, No X-Rays     Consultant:  [name], APRN,NP-C  Total Time of Encounter:  30 minutes   Time in Counseling as above:  20 minutes   Start time:  1230  Stop Time:  [address]      [name], APRN,NP-C  05/22/2018, 14:14

## 2018-05-22 NOTE — Care Plan (Signed)
CMO status continued. Trach care provided with suctioning for excessive secretions. Scheduled medications given. Patient resting comfortably in bed while being less agitated. Extra trach supplies at bedside. SS on and audible. Free from falls this shift. Will continue to monitor.      Problem: Adult Inpatient Plan of Care  Goal: Plan of Care Review  Outcome: Ongoing (see interventions/notes)  Goal: Patient-Specific Goal (Individualized)  Outcome: Ongoing (see interventions/notes)  Goal: Absence of Hospital-Acquired Illness or Injury  Outcome: Ongoing (see interventions/notes)  Goal: Optimal Comfort and Wellbeing  Outcome: Ongoing (see interventions/notes)  Goal: Rounds/Family Conference  Outcome: Ongoing (see interventions/notes)     Problem: Fall Injury Risk  Goal: Absence of Fall and Fall-Related Injury  Outcome: Ongoing (see interventions/notes)     Problem: Skin Injury Risk Increased  Goal: Skin Health and Integrity  Outcome: Ongoing (see interventions/notes)     Problem: Communication Impairment (Artificial Airway)  Goal: Effective Communication  Outcome: Ongoing (see interventions/notes)     Problem: Device-Related Complication Risk (Artificial Airway)  Goal: Optimal Device Function  Outcome: Ongoing (see interventions/notes)     Problem: Skin and Tissue Injury (Artificial Airway)  Goal: Absence of Device-Related Skin or Tissue Injury  Outcome: Ongoing (see interventions/notes)     Problem: End-of-Life Care  Goal: Comfort, Peace and Preserved Dignity  Outcome: Ongoing (see interventions/notes)     Lorelei Pont, RN  05/22/2018, 02:32

## 2018-05-22 NOTE — Care Plan (Signed)
Problem: Adult Inpatient Plan of Care  Goal: Plan of Care Review  Outcome: Ongoing (see interventions/notes)     Problem: Adult Inpatient Plan of Care  Goal: Patient-Specific Goal (Individualized)  Outcome: Ongoing (see interventions/notes)  Flowsheets (Taken 05/22/2018 1724)  Individualized Care Needs: trach suctioning     Problem: Adult Inpatient Plan of Care  Goal: Absence of Hospital-Acquired Illness or Injury  Outcome: Ongoing (see interventions/notes)     Problem: Adult Inpatient Plan of Care  Goal: Optimal Comfort and Wellbeing  Outcome: Ongoing (see interventions/notes)     Problem: Adult Inpatient Plan of Care  Goal: Rounds/Family Conference  Outcome: Ongoing (see interventions/notes)     Problem: Fall Injury Risk  Goal: Absence of Fall and Fall-Related Injury  Outcome: Ongoing (see interventions/notes)     Problem: Skin Injury Risk Increased  Goal: Skin Health and Integrity  Outcome: Ongoing (see interventions/notes)     Plan of care reviewed. Pt somnolent/ unresponsive at times. Pain managed with meds per MAR. Assessment per doc flow. Pt free from falls this shift. Patient turned and repositioned, gtube flushed, medications administered through gtube. Called family and updated them. Tracheal suctioning provided, inner canula changed. CMO care continued, patient awaiting placement. Needs met with hourly rounding. Pt resting at this time with call bell in reach. Wheels locked and bed in lowest position. Will continue to monitor.     Estelle Grumbles, RN  05/22/2018, 17:26

## 2018-05-22 NOTE — Nurses Notes (Addendum)
Patient repositioned, copious amount of yellow, thick secretions suctioned. PRN pain medication administered. Respirations 24.

## 2018-05-22 NOTE — Care Management Notes (Addendum)
[facility]  Care Management Note    Patient Name: Antonio Gillespie  Date of Birth: 1961/02/10  Sex: male  Date/Time of Admission: 04/28/2018  3:32 PM  Room/Bed: 725/A  Payor: [employer] PENDING MEDICAID / Plan: ELIGIBILITY PENDING MEDICAID / Product Type: Medicaid /    LOS: 24 days   Primary Care Providers:  Selinda Eon, MD, MD (General)    Admitting Diagnosis:  Scalp hematoma [S00.03XA]    Assessment:      05/22/18 1356   Assessment Details   Assessment Type Continued Assessment   Date of Care Management Update 05/22/18   Date of Next DCP Update 05/24/18   Care Management Plan   Discharge Planning Status plan in progress   Projected Discharge Date 05/23/18   Discharge Needs Assessment   Discharge Facility/Level of Care Needs Nursing Facility-Primary Residence/Not Skilled (code 1)   Transportation Available ambulance     [place]. No accepting facility at this time due to prior level need of using a BiPAP when original referrals were made. Pt no longer on BiPAP. Sent updated clinical info to SNFs that already made. MSW spoke with Fleet Contras with supportive care. Pt does not meet criteria for inpt hospice. Need to pursue nursing home placement. [facility] [employer] assisting [place] med[place]. Will continue to follow.    1435: MSW spoke with [facility] [name] via telephone and updated him on DC planning. He states he received a copy of the HCS form, however, his last name is incorrect. MSW informed him a new HCS will need to be completed to make the correction. MSW updated service regarding need to complete a new HCS with the correct last name. Updated HCS form in CM office waiting for signature.     1458: Per [facility] [facility] declined referral stating care needs exceed their capacity.    Discharge Plan:  Nursing Facility-Primary Residence/Not Skilled (code 1)      The patient will continue to be evaluated for developing discharge needs.     Case  Manager: Claiborne Rigg  Phone: [phone]

## 2018-05-22 NOTE — Nurses Notes (Signed)
Patient pulled off external urinary catheters x2. Will leave off at this time.

## 2018-05-22 NOTE — Progress Notes (Signed)
[facility]   Trauma Progress Note         Terez, Millard, 58 y.o. male  Date of Birth:  1960-06-30  Date of Admission:  04/28/2018  Date of service: 05/22/2018  [name], 58 y.o., male Post trauma day 24 status post fall.    Assessment/Plan:  CMO    58 yo m found down at home in urine/feces found to have R humeral neck fx, large L inguinal hernia, and b/l scalp hematomas    POD 17 s/p trach & peg          CMO      ABX:  Levofloxacin PO last dose 4/3, Augmentin last dose 3/28, Cefazolin last dose 3/24, Linezolid last dose 3/23  Diet/fluids: DIET REGULAR diet, Last Bowel Movement: 05/21/18  Pain control:  Roxanol 2.5 mg PO Q4H PRN, tylenol G-tube, Q4H PRN  GI/DVT prophylaxis: Enoxaparin and SCDs/ Venodynes/Impulse boots  Nutrition: DIET REGULAR diet, Last Bowel Movement: 05/21/18  Flatus/last BM:  BM 4/5  Lines:  Cuffed Trach, Gastrostomy tube (left), midline   - Please call or page with questions or concerns      Events over the last 24 hours have included:  Temp 38.1 on 4/5, HR 116 AM of 4/6, otherwise no acute events        Subjective:    Patient has no complaints this morning.  Patient denies abd pain, nausea, vomiting, or chest pain.      Vital Signs:  Temp (24hrs) Max:38.1 C (100.6 F)      Systolic (24hrs), Avg:148 , Min:148 , Max:148     Diastolic (24hrs), Avg:80, Min:80, Max:80    Temp  Avg: 37.5 C (99.5 F)  Min: 36.9 C (98.4 F)  Max: 38.1 C (100.6 F)  MAP (Non-Invasive)  Avg: 94 mmHG  Min: 94 mmHG  Max: 94 mmHG  Pulse  Avg: 116  Min: 116  Max: 116  Resp  Avg: 16  Min: 16  Max: 16  SpO2  Avg: 97 %  Min: 97 %  Max: 97 %  Temperature: 36.9 C (98.4 F)  Heart Rate: (!) 116  BP (Non-Invasive): (!) 148/80  Respiratory Rate: 16  SpO2: 97 %  Pain Score (Numeric, Faces): Other    Today's Physical Exam:  GEN:   NAD  HEENT:   Trach in place with moderate amount of purulent mucus drainage. Normocephalic; atraumatic. Poor dentition.   NECK:   Non-tender to palpation, full ROM without pain  or neurologic changes.  PULM:   Normal respiratory effort.  Bilateral course rhonchi.   CV:   Regular rate and rhythm.  ABD:   Distended, non tender, peg tube in place  MS: Atraumatic.  Distal pulses intact.  Normal strength and range of motion of all extremities.    NEURO:   A&O to person not to place and time.   Integumentary:  Pink warm, and dry and intact       Labs:  No results for input(s): WBC, BANDS, HGB, HCT, SODIUM, POTASSIUM, CHLORIDE, BICARBONATE, BUN, CREATININE, GLUCOSE, ANIONGAP, CALCIUM, MAGNESIUM, PHOSPHORUS, INR in the last 72 hours.    Invalid input(s): PLATELET, PTT, PT  In: 206 [P.O.:10; GT:196]  Out: -     Current Medications:  acetaminophen (TYLENOL) 160 mg per 5 mL liquid - grape flavored, 650 mg, Gastric (NG, OG, PEG, GT), Q4H PRN  atropine (ISOPTO ATROPINE) 1% ophthalmic solution - for SUBLINGUAL use, 2 Drop, Sublingual, Q1H PRN  glycopyrrolate (ROBINUL) 0.2  mg/mL (200 mcg/mL) injection, 200 mcg, Intravenous, Q4H PRN  glycopyrrolate (ROBINUL) 0.2 mg/mL (200 mcg/mL) injection, 200 mcg, Intravenous, Q8H  guaiFENesin (ROBITUSSIN) 100mg  per 48mL oral liquid - for cough (expectorant), 10 mL, Gastric (NG, OG, PEG, GT), Q6H  LORazepam (ATIVAN) 2 mg/mL injection, 1 mg, Intravenous, Q4H PRN  metoprolol tartrate (LOPRESSOR) tablet, 12.5 mg, Gastric (NG, OG, PEG, GT), 2x/day  morphine concentrate (ROXANOL) 10 mg per 0.5 mL oral liquid, 2.6 mg, Oral, Q4H PRN  NS flush syringe, 2 mL, Intracatheter, Q8HRS    And  NS flush syringe, 2-6 mL, Intracatheter, Q1 MIN PRN  scopolamine 1 mg over 3 days transdermal patch, 1 Patch, Transdermal, Q72H          Nutrition Management: DIET REGULAR Last Bowel Movement: 05/21/18  No results for input(s): ALBUMIN, PREALBUMIN in the last 72 hours.    I/O:  I/O last 24 hours:      Intake/Output Summary (Last 24 hours) at 05/22/2018 0507  Last data filed at 05/21/2018 2200  Gross per 24 hour   Intake 206 ml   Output --   Net 206 ml     I/O current shift:    04/05 1900 - 04/06  0659  In: 70 [GT:70]  Out: -       Prophylaxis:    DVT/PE  Enoxaparin and SCDs/ Venodynes/Impulse boots   GI: Not indicated     Nutrition/Residuals:  DIET REGULAR    Radiology Tests:  N/A    Huel Cote, MD 05/22/2018, 05:07   PGY-1 Emergency Medicine  [facility]  Pager # - [phone]/ SPOK Mobile      This note was partially generated using MModal Fluency Direct system, and there may be some incorrect words, spellings, and punctuation that were not noted in checking the note before saving.    -----      I saw and examined the patient.  I reviewed the resident's note.  I agree with the findings and plan of care as documented in the resident's note.  Any exceptions/additions are edited/noted.    Patient CMO, awaiting placement at SNF with hospice    Cherly Hensen, MD

## 2018-05-23 MED ORDER — ALBUTEROL SULFATE 2.5 MG/3 ML (0.083 %) SOLUTION FOR NEBULIZATION
2.5000 mg | INHALATION_SOLUTION | RESPIRATORY_TRACT | Status: AC
Start: 2018-05-23 — End: 2018-05-23
  Administered 2018-05-23: 12:00:00 2.5 mg via RESPIRATORY_TRACT
  Filled 2018-05-23: qty 6

## 2018-05-23 MED ORDER — ALBUTEROL SULFATE CONCENTRATE 2.5 MG/0.5 ML SOLUTION FOR NEBULIZATION
2.5000 mg | INHALATION_SOLUTION | RESPIRATORY_TRACT | Status: AC
Start: 2018-05-23 — End: 2018-05-23
  Administered 2018-05-23: 2.5 mg via RESPIRATORY_TRACT

## 2018-05-23 MED ADMIN — sodium chloride 0.9 % (flush) injection syringe: @ 11:00:00

## 2018-05-23 MED ADMIN — glycopyrrolate 0.2 mg/mL injection solution: INTRAVENOUS | @ 11:00:00

## 2018-05-23 MED ADMIN — glycopyrrolate 0.2 mg/mL injection solution: INTRAVENOUS | @ 04:00:00

## 2018-05-23 MED ADMIN — sodium chloride 0.9 % (flush) injection syringe: @ 13:00:00

## 2018-05-23 MED ADMIN — guaiFENesin 100 mg/5 mL oral liquid: GASTROSTOMY | @ 22:00:00

## 2018-05-23 MED ADMIN — morphine concentrate 10 mg/0.5 mL oral syringe (FOR ORAL USE ONLY): GASTROSTOMY | @ 04:00:00

## 2018-05-23 MED ADMIN — sodium chloride 0.9 % (flush) injection syringe: @ 20:00:00

## 2018-05-23 MED ADMIN — flu vaccine ts2013-14(36 mo,up)(PF)45 mcg(15 mcg x3)/0.5 mL IM syringe: GASTROSTOMY | @ 20:00:00

## 2018-05-23 MED ADMIN — glycopyrrolate 0.2 mg/mL injection solution: INTRAVENOUS | @ 20:00:00

## 2018-05-23 MED ADMIN — guaiFENesin 100 mg/5 mL oral liquid: GASTROSTOMY | @ 18:00:00

## 2018-05-23 MED ADMIN — nitroglycerin 50 mg/250 mL (200 mcg/mL) in 5 % dextrose intravenous: GASTROSTOMY | @ 11:00:00

## 2018-05-23 MED ADMIN — tretinoin 0.01 % topical gel: GASTROSTOMY | @ 05:00:00 | NDC 45802036242

## 2018-05-23 MED ADMIN — oxytocin 20 unit/1000 mL in 0.9 % sodium chloride intravenous: @ 04:00:00

## 2018-05-23 NOTE — Care Management Notes (Addendum)
Still no accepting facility at this time. Expanded search. Will continue to follow.    1234: [facility] and [address], [facility] and [facility] have expressed an interest. At this time there are 30 facilities not able to accept. Will continue to follow. Per [employer], [place] medicaid application has been faxed into [facility]. Will continue to follow.

## 2018-05-23 NOTE — Progress Notes (Signed)
[facility]   Trauma Progress Note         Antonio Gillespie, Antonio Gillespie, 58 y.o. male  Date of Birth:  10/04/1960  Date of Admission:  04/28/2018  Date of service: 05/23/2018  Antonio Gillespie, 58 y.o., male Post trauma day 25 status post fall.    Assessment/Plan:  CMO    58 yo m found down at home in urine/feces found to have R humeral neck fx, large L inguinal hernia, and b/l scalp hematomas    POD 18 s/p trach & peg          CMO      ABX:  Levofloxacin PO last dose 4/3, Augmentin last dose 3/28, Cefazolin last dose 3/24, Linezolid last dose 3/23  Diet/fluids: DIET REGULAR diet, Last Bowel Movement: 05/21/18  Pain control:  Roxanol 2.5 mg PO Q4H PRN, tylenol G-tube, Q4H PRN  GI/DVT prophylaxis: Enoxaparin and SCDs/ Venodynes/Impulse boots  Nutrition: DIET REGULAR diet, Last Bowel Movement: 05/21/18  Flatus/last BM:  BM 4/5  Lines:  Cuffed Trach, Gastrostomy tube (left), midline   - Please call or page with questions or concerns      Events over the last 24 hours have included:  -External Urinary catheter removed due to patient pulling at it.   -Fever resolved no otherwise no acute events        Subjective:    Patient more somnolent this morning, able to nod yes or no to several questions, indicating patient does not have any current chest pain, or abdominal pain.  Patient able to respond to and follow some commands.    Vital Signs:  Temp (24hrs) Max:37.1 C (98.8 F)      Systolic (24hrs), Avg:107 , Min:107 , Max:107     Diastolic (24hrs), Avg:70, Min:70, Max:70    Temp  Avg: 37.1 C (98.8 F)  Min: 37.1 C (98.8 F)  Max: 37.1 C (98.8 F)  MAP (Non-Invasive)  Avg: 76 mmHG  Min: 76 mmHG  Max: 76 mmHG  Pulse  Avg: 96  Min: 84  Max: 108  Resp  Avg: 18  Min: 18  Max: 18  SpO2  Avg: 92 %  Min: 92 %  Max: 92 %  Temperature: 37.1 C (98.8 F)  Heart Rate: 84  BP (Non-Invasive): 107/70  Respiratory Rate: 18  SpO2: 92 %  Pain Score (Numeric, Faces): 1    Today's Physical Exam:  GEN:   Somnolent, able to respond to  follow some commands.  HEENT:   Trach in place with trace amount of purulent mucus drainage. Normocephalic; atraumatic. Poor dentition.   NECK:  Trach in place with trace amount appearing line mucus drainage.  PULM:   Normal respiratory effort.  Trace bilateral rhonchi, bilateral expiratory wheezes.  CV:   Regular rate and rhythm.  ABD:   Distended, non tender, peg tube in place  MS: Atraumatic.  Distal pulses intact.      NEURO:   A&O to person not to place and time.  Patient able to move feet, in bilateral hands in response to commands.  Integumentary:  Pale, warm, and dry and intact, several black spots on distal left toes without overlying ulceration.      Labs:  No results for input(s): WBC, BANDS, HGB, HCT, SODIUM, POTASSIUM, CHLORIDE, BICARBONATE, BUN, CREATININE, GLUCOSE, ANIONGAP, CALCIUM, MAGNESIUM, PHOSPHORUS, INR in the last 72 hours.    Invalid input(s): PLATELET, PTT, PT  In: 190 [GT:190]  Out: -     Current  Medications:  acetaminophen (TYLENOL) 160 mg per 5 mL liquid - grape flavored, 650 mg, Gastric (NG, OG, PEG, GT), Q4H PRN  atropine (ISOPTO ATROPINE) 1% ophthalmic solution - for SUBLINGUAL use, 2 Drop, Sublingual, Q1H PRN  glycopyrrolate (ROBINUL) 0.2 mg/mL (200 mcg/mL) injection, 200 mcg, Intravenous, Q4H PRN  glycopyrrolate (ROBINUL) 0.2 mg/mL (200 mcg/mL) injection, 200 mcg, Intravenous, Q8H  guaiFENesin (ROBITUSSIN) 100mg  per 5mL oral liquid - for cough (expectorant), 10 mL, Gastric (NG, OG, PEG, GT), Q6H  LORazepam (ATIVAN) 2 mg/mL injection, 1 mg, Intravenous, Q4H PRN  metoprolol tartrate (LOPRESSOR) tablet, 12.5 mg, Gastric (NG, OG, PEG, GT), 2x/day  morphine concentrate (ROXANOL) 10 mg per 0.5 mL oral liquid, 2.6 mg, Gastric (NG, OG, PEG, GT), Q4H PRN  NS flush syringe, 2 mL, Intracatheter, Q8HRS    And  NS flush syringe, 2-6 mL, Intracatheter, Q1 MIN PRN  scopolamine 1 mg over 3 days transdermal patch, 1 Patch, Transdermal, Q72H          Nutrition Management: DIET REGULAR Last Bowel  Movement: 05/21/18  No results for input(s): ALBUMIN, PREALBUMIN in the last 72 hours.    I/O:  I/O last 24 hours:      Intake/Output Summary (Last 24 hours) at 05/23/2018 0549  Last data filed at 05/23/2018 0000  Gross per 24 hour   Intake 150 ml   Output --   Net 150 ml     I/O current shift:    04/06 1900 - 04/07 0659  In: 30 [P.O.:30]  Out: -       Prophylaxis:    DVT/PE  Enoxaparin and SCDs/ Venodynes/Impulse boots   GI: Not indicated     Nutrition/Residuals:  DIET REGULAR    Radiology Tests:  N/A    Antonio Gillespie/Antonio Hayward, MD 05/22/2018, 05:07   PGY-1 Emergency Medicine  [facility] [place]  Pager # - [phone]/ SPOK Mobile      Late entry for 05/23/18. I saw and examined the patient.  I reviewed the resident's note.  I agree with the findings and plan of care as documented in the resident's note.  Any exceptions/additions are edited/noted.    Antonio HensenAmanda Alison Breeding, MD

## 2018-05-23 NOTE — Care Plan (Signed)
Problem: Adult Inpatient Plan of Care  Goal: Plan of Care Review  Outcome: Ongoing (see interventions/notes)  Note: Plan of care reviewed. Pt alert at times but unable to speak, refusing speaking valve to be placed. Assessment per doc flow. Pt free from falls this shift. Sitter select on and active. Pt required deep trach suction 2x this shift. Needs met with hourly rounding. Pt resting at this time with call bell in reach. Wheels locked and bed in lowest position. Will continue to monitor.   Goal: Patient-Specific Goal (Individualized)  Outcome: Ongoing (see interventions/notes)  Flowsheets  Taken 05/22/2018 1724 by Estelle Grumbles, RN  Individualized Care Needs: trach suctioning  Taken 05/23/2018 1823 by Geralynn Ochs, RN  Patient-Specific Goals (Include Timeframe): keep pt comfortable  Goal: Absence of Hospital-Acquired Illness or Injury  Outcome: Ongoing (see interventions/notes)  Goal: Optimal Comfort and Wellbeing  Outcome: Ongoing (see interventions/notes)  Goal: Rounds/Family Conference  Outcome: Ongoing (see interventions/notes)     Problem: Fall Injury Risk  Goal: Absence of Fall and Fall-Related Injury  Outcome: Ongoing (see interventions/notes)     Problem: Skin Injury Risk Increased  Goal: Skin Health and Integrity  Outcome: Ongoing (see interventions/notes)     Problem: Communication Impairment (Artificial Airway)  Goal: Effective Communication  Outcome: Ongoing (see interventions/notes)     Problem: Skin and Tissue Injury (Artificial Airway)  Goal: Absence of Device-Related Skin or Tissue Injury  Outcome: Ongoing (see interventions/notes)     Problem: End-of-Life Care  Goal: Comfort, Peace and Preserved Dignity  Outcome: Ongoing (see interventions/notes)

## 2018-05-23 NOTE — Ancillary Notes (Cosign Needed)
[facility]  Caregiver Phone Call      Date and Time: 4/7  1430     Caregiver Name: Waldron Session (Brother)    Length of Time on Call: 4 minutes  Summary of Conversation:  Discussed patient's current condition including patient's ability to respond to questions with nodding head yes or no, patient opening eyes to verbal stimuli.  Patient's brother asked if his organs were shutting down, indicated that tests are not being run due to the patient being CMO.  However, did indicate patient's breathing was a little wheezy today for which he was given albuterol.    Patient's brother was grateful for the call.    Huel Cote, MD    Huel Cote, MD 05/23/2018, 14:33   PGY-1 Emergency Medicine  [facility]  Pager # - [phone] SPOK Mobile      This note was partially generated using MModal Fluency Direct system, and there may be some incorrect words, spellings, and punctuation that were not noted in checking the note before saving.    -----

## 2018-05-24 DIAGNOSIS — J189 Pneumonia, unspecified organism: Secondary | ICD-10-CM

## 2018-05-24 DIAGNOSIS — W19XXXA Unspecified fall, initial encounter: Secondary | ICD-10-CM

## 2018-05-24 DIAGNOSIS — S0003XA Contusion of scalp, initial encounter: Secondary | ICD-10-CM

## 2018-05-24 DIAGNOSIS — R410 Disorientation, unspecified: Secondary | ICD-10-CM

## 2018-05-24 DIAGNOSIS — Z9911 Dependence on respirator [ventilator] status: Secondary | ICD-10-CM

## 2018-05-24 DIAGNOSIS — D649 Anemia, unspecified: Secondary | ICD-10-CM

## 2018-05-24 DIAGNOSIS — E876 Hypokalemia: Secondary | ICD-10-CM

## 2018-05-24 DIAGNOSIS — K4090 Unilateral inguinal hernia, without obstruction or gangrene, not specified as recurrent: Secondary | ICD-10-CM

## 2018-05-24 DIAGNOSIS — G8911 Acute pain due to trauma: Secondary | ICD-10-CM

## 2018-05-24 DIAGNOSIS — S42301A Unspecified fracture of shaft of humerus, right arm, initial encounter for closed fracture: Secondary | ICD-10-CM

## 2018-05-24 MED ADMIN — sodium chloride 0.9 % intravenous solution: GASTROSTOMY | @ 05:00:00

## 2018-05-24 MED ADMIN — sodium chloride 0.9 % (flush) injection syringe: @ 21:00:00

## 2018-05-24 MED ADMIN — sodium chloride 0.9 % (flush) injection syringe: @ 14:00:00

## 2018-05-24 MED ADMIN — morphine concentrate 10 mg/0.5 mL oral syringe (FOR ORAL USE ONLY): GASTROSTOMY | @ 05:00:00

## 2018-05-24 MED ADMIN — glycopyrrolate 0.2 mg/mL injection solution: INTRAVENOUS | @ 21:00:00

## 2018-05-24 MED ADMIN — sodium chloride 0.9 % (flush) injection syringe: INTRAVENOUS | @ 11:00:00

## 2018-05-24 MED ADMIN — sodium chloride 0.9 % (flush) injection syringe: GASTROSTOMY | @ 11:00:00

## 2018-05-24 NOTE — Care Management Notes (Signed)
[facility]  Care Management Note    Patient Name: Antonio Gillespie  Date of Birth: 1960/05/29  Sex: male  Date/Time of Admission: 04/28/2018  3:32 PM  Room/Bed: 725/A  Payor: [employer] PENDING MEDICAID / Plan: ELIGIBILITY PENDING MEDICAID / Product Type: Medicaid /    LOS: 26 days   Primary Care Providers:  Selinda Eon, MD, MD (General)    Admitting Diagnosis:  Scalp hematoma [S00.03XA]    Assessment:      05/24/18 1307   Assessment Details   Assessment Type Continued Assessment   Date of Care Management Update 05/24/18   Date of Next DCP Update 05/26/18   Care Management Plan   Discharge Planning Status plan in progress   Projected Discharge Date 05/25/18   Discharge plan discussed with: Health Care Surrogate   CM will evaluate for rehabilitation potential yes   Patient choice offered to patient/family yes   Form for patient choice reviewed/signed and on chart yes   Facility or Agency Preferences [facility], [facility]   Patient aware of possible cost for ambulance transport?  Yes   Discharge Needs Assessment   Discharge Facility/Level of Care Needs Nursing Facility-Primary Residence/Not Skilled (code 1);Home with Hospice (code 79)   Transportation Available ambulance     MSW spoke with [facility], Waldron Session [phone]. MSW updated him regarding DC planning. MSW informed him at this time [facility] is the only accepting facility within a 40 mile radius willing to accept pt. He states he was hoping for some place closer, however, he was agreeable as they are the only facility at this time willing to accept. MSW also informed him they are allowing hospice to go in to provide additional support/care. MSW reviewed choice for hospice (and nursing home) and he chose [facility]. MSW spoke with Charlynne Pander with [facility] [phone]. She states the referral will go to their [place] location. Referral sent via allscripts.  COVID test has been ordered.  Waiting for it to be completed and results. MSW will continue to follow.     Discharge Plan:  Home with Hospice (code 73), Nursing Facility-Primary Residence/Not Skilled (code 1)      The patient will continue to be evaluated for developing discharge needs.     Case Manager: Claiborne Rigg  Phone: [phone]

## 2018-05-24 NOTE — Nurses Notes (Signed)
Patient appears  Comfortable denies pain RR is 18 breaths per minute.Trache suction.Oral care rendered.Incontinence check.Reposition as needed.

## 2018-05-24 NOTE — Discharge Instructions (Signed)
Discharge Recommendations/ Plan:Discharge GS:PJSUNHR Facility-Primary Residence/Not Skilled (code 1)      Resources: Nurse to call report to [facility] and [employer] [phone]

## 2018-05-24 NOTE — Care Plan (Signed)
Medical Nutrition Therapy Follow Up    I: Per MD notes, pt is now Comfort Measures Only since 4/3. TFs discontinued and regular diet was initiated for pleasure feeds. Refusing/unable to eat even with total feeding assistance. Working to d/c to SNF with hospice.       P: Will sign off at this time. Continue comfort feeds as appropriate. Please consult if further nutrition intervention is warranted.       Blima Dessert, RDLD  05/24/2018, 11:13  Pager: [phone]

## 2018-05-24 NOTE — Nurses Notes (Signed)
Patient appears  Comfortable denies pain RR is 18 breaths per minute.Trache suction.Oral care rendered.Incontinence check.Due medications given thru g-tube.Reposition as needed.

## 2018-05-24 NOTE — Nurses Notes (Signed)
Pt required suctioning at this time. Pt tolerated it well. Will continue to monitor.

## 2018-05-24 NOTE — Ancillary Notes (Cosign Needed)
[facility]  Caregiver Phone Call      Date and Time:4/8  1350    Caregiver Name: Janyth Contes (brother)    Length of Time on Call: Family Member did not answer  Summary of Conversation: N/A    Huel Cote, MD

## 2018-05-24 NOTE — Nurses Notes (Signed)
Pt offered and refused morphine concentrate (Roxanol) for dyspnea and labored breathing. Pt also refusing suctioning at this time. Trach dressing changed. Pt repositioned with HOB elevated at 30 degrees. Will continue to monitor.

## 2018-05-24 NOTE — Progress Notes (Signed)
[facility]   Trauma Progress Note         Antonio Gillespie, Antonio Gillespie, 58 y.o. male  Date of Birth:  02/27/1960  Date of Admission:  04/28/2018  Date of service: 05/24/2018  [name], 58 y.o., male Post trauma day 25 status post fall.    Assessment/Plan:  CMO    58 yo m found down at home in urine/feces found to have R humeral neck fx, large L inguinal hernia, and b/l scalp hematomas    POD 18 s/p trach & peg      Discharge to SNF with hospice pending placement.     [facility], [facility], and [facility] have expressed interest in taking the Pt. Medicaid application has been sent.       ABX:  Levofloxacin PO last dose 4/3, Augmentin last dose 3/28, Cefazolin last dose 3/24, Linezolid last dose 3/23  Diet/fluids: DIET REGULAR diet, Last Bowel Movement: 05/21/18  Pain control:  Roxanol 2.5 mg PO Q4H PRN, tylenol G-tube, Q4H PRN  GI/DVT prophylaxis: Enoxaparin and SCDs/ Venodynes/Impulse boots  Nutrition: DIET REGULAR diet, Last Bowel Movement: 05/21/18  Flatus/last BM:  BM 4/5  Lines:  Cuffed Trach, Gastrostomy tube (left), midline   - Please call or page with questions or concerns      Events over the last 24 hours have included:  -patient received albuterol nebulizer yesterday or wheezing.  -no acute events in the past 24 hours.  -patient refused suctioning and morphine overnight.        Subjective:    Patient alert and responsive, able to nod yes and no to several questions.  Patient denied chest pain, shortness of breath, abdominal pain.  Patient indicated he did not want suctioning or albuterol nebulizer or morphine at this time.    Vital Signs:  Temp (24hrs) Max:36.9 C (98.4 F)      Systolic (24hrs), Avg:142 , Min:136 , Max:147     Diastolic (24hrs), Avg:78, Min:68, Max:87    Temp  Avg: 36.5 C (97.7 F)  Min: 36.3 C (97.3 F)  Max: 36.9 C (98.4 F)  MAP (Non-Invasive)  Avg: 92.5 mmHG  Min: 87 mmHG  Max: 98 mmHG  Pulse  Avg: 94.5  Min: 86  Max: 103  Resp  Avg: 20  Min:  20  Max: 20  SpO2  Avg: 95.3 %  Min: 91 %  Max: 100 %  Temperature: 36.9 C (98.4 F)  Heart Rate: (!) 103  BP (Non-Invasive): 136/87  Respiratory Rate: 20  SpO2: 95 %  Pain Score (Numeric, Faces): 1    Today's Physical Exam:   GEN:   Awake and alert able to respond to follow commands.  HEENT:   Trach in place with mild amount of purulent mucus drainage. Normocephalic; atraumatic. Poor dentition.   NECK:  Trach in place with mild amount appearing line mucus drainage.  PULM:   Normal respiratory effort.  Referred upper respiratory sounds, mild bilateral expiratory wheezes.  CV:   Regular rate and rhythm.  ABD:   Distended, non tender, peg tube in place  MS: Atraumatic.  Distal pulses intact.      NEURO:   A&O to person not to place and time.  Patient able to move feet, in bilateral hands in response to commands.  Integumentary:  Pale, warm, and dry and intact, several black spots on distal left toes without overlying ulceration.      Labs:  No results for input(s): WBC,  BANDS, HGB, HCT, SODIUM, POTASSIUM, CHLORIDE, BICARBONATE, BUN, CREATININE, GLUCOSE, ANIONGAP, CALCIUM, MAGNESIUM, PHOSPHORUS, INR in the last 72 hours.    Invalid input(s): PLATELET, PTT, PT  In: 230 [P.O.:30; GT:200]  Out: -     Current Medications:  acetaminophen (TYLENOL) 160 mg per 5 mL liquid - grape flavored, 650 mg, Gastric (NG, OG, PEG, GT), Q4H PRN  atropine (ISOPTO ATROPINE) 1% ophthalmic solution - for SUBLINGUAL use, 2 Drop, Sublingual, Q1H PRN  glycopyrrolate (ROBINUL) 0.2 mg/mL (200 mcg/mL) injection, 200 mcg, Intravenous, Q4H PRN  glycopyrrolate (ROBINUL) 0.2 mg/mL (200 mcg/mL) injection, 200 mcg, Intravenous, Q8H  guaiFENesin (ROBITUSSIN) 100mg  per 5mL oral liquid - for cough (expectorant), 10 mL, Gastric (NG, OG, PEG, GT), Q6H  LORazepam (ATIVAN) 2 mg/mL injection, 1 mg, Intravenous, Q4H PRN  metoprolol tartrate (LOPRESSOR) tablet, 12.5 mg, Gastric (NG, OG, PEG, GT), 2x/day  morphine concentrate (ROXANOL) 10 mg per 0.5 mL oral  liquid, 2.6 mg, Gastric (NG, OG, PEG, GT), Q4H PRN  NS flush syringe, 2 mL, Intracatheter, Q8HRS    And  NS flush syringe, 2-6 mL, Intracatheter, Q1 MIN PRN  scopolamine 1 mg over 3 days transdermal patch, 1 Patch, Transdermal, Q72H          Nutrition Management: DIET REGULAR Last Bowel Movement: 05/21/18  No results for input(s): ALBUMIN, PREALBUMIN in the last 72 hours.    I/O:  I/O last 24 hours:      Intake/Output Summary (Last 24 hours) at 05/24/2018 0629  Last data filed at 05/24/2018 0500  Gross per 24 hour   Intake 350 ml   Output 375 ml   Net -25 ml     I/O current shift:    04/07 1900 - 04/08 0659  In: 200 [P.O.:100; GT:100]  Out: 375 [Urine:375]      Prophylaxis:    DVT/PE  Enoxaparin and SCDs/ Venodynes/Impulse boots   GI: Not indicated     Nutrition/Residuals:  DIET REGULAR    Radiology Tests:  N/A     Huel Cote/Isaac Hayward, MD 05/24/2018, 06:41   PGY-1 Emergency Medicine  [facility]  Pager # - [phone]/ SPOK Mobile      This note was partially generated using MModal Fluency Direct system, and there may be some incorrect words, spellings, and punctuation that were not noted in checking the note before saving.    -----      Late entry for 05/24/18. I saw and examined the patient.  I reviewed the resident's note.  I agree with the findings and plan of care as documented in the resident's note.  Any exceptions/additions are edited/noted.    Cherly HensenAmanda Mendi Constable, MD

## 2018-05-24 NOTE — Care Management Notes (Addendum)
[facility]  Care Management Note    Patient Name: Antonio Gillespie  Date of Birth: 04/14/1960  Sex: male  Date/Time of Admission: 04/28/2018  3:32 PM  Room/Bed: 725/A  Payor: OUTSOURCE PENDING MEDICAID / Plan: ELIGIBILITY PENDING MEDICAID / Product Type: Medicaid /    LOS: 26 days   Primary Care Providers:  Selinda Eon, MD, MD (General)    Admitting Diagnosis:  Scalp hematoma [S00.03XA]    Assessment:      05/24/18 1020   Assessment Details   Assessment Type Continued Assessment   Date of Care Management Update 05/24/18   Date of Next DCP Update 05/26/18   Care Management Plan   Discharge Planning Status plan in progress   Projected Discharge Date 05/26/18   Discharge Needs Assessment   Discharge Facility/Level of Care Needs Nursing Facility-Primary Residence/Not Skilled (code 1)   Transportation Available ambulance     MSW received message from [facility] and [facility] stating they are not able to accommodate pt's trach. Up to 43 facility denials. Left VM with [facility] [phone], [facility] and [facility]. They have expressed an interest with pt. Will continue to follow.     Addendum 1147: MSW spoke with Tresa Endo with [facility], they are willing to accept pending his COVID swab results. She states they will allow hospice to come into their facility. She states [facility] is one of their primary agencies they use. MSW spoke with service regarding the COVID 19 testing was needed.     Discharge Plan:  Nursing Facility-Primary Residence/Not Skilled (code 1)      The patient will continue to be evaluated for developing discharge needs.     Case Manager: Claiborne Rigg  Phone: [phone]

## 2018-05-24 NOTE — Care Plan (Signed)
Problem: Adult Inpatient Plan of Care  Goal: Plan of Care Review  Outcome: Ongoing (see interventions/notes)     Problem: Adult Inpatient Plan of Care  Goal: Patient-Specific Goal (Individualized)  Outcome: Ongoing (see interventions/notes)  Flowsheets (Taken 05/24/2018 1548)  Individualized Care Needs: ate 1 pudding today with assistance     Problem: Adult Inpatient Plan of Care  Goal: Absence of Hospital-Acquired Illness or Injury  Outcome: Ongoing (see interventions/notes)     Problem: Adult Inpatient Plan of Care  Goal: Optimal Comfort and Wellbeing  Outcome: Ongoing (see interventions/notes)     Problem: Adult Inpatient Plan of Care  Goal: Rounds/Family Conference  Outcome: Ongoing (see interventions/notes)     Problem: Fall Injury Risk  Goal: Absence of Fall and Fall-Related Injury  Outcome: Ongoing (see interventions/notes)     Problem: Skin Injury Risk Increased  Goal: Skin Health and Integrity  Outcome: Ongoing (see interventions/notes)     Plan of care reviewed. Pt alert at times and cooperative with care this shift. Pain managed with meds per MAR. Assessment per doc flow. Pt free from falls this shift. Patient turned q2, suctioning provided. Patient to go to SNF with hospice tomorrow. Patients family updated. Needs met with hourly rounding. Pt resting at this time with call bell in reach. Wheels locked and bed in lowest position. Will continue to monitor.     Estelle Grumbles, RN  05/24/2018, 15:50

## 2018-05-24 NOTE — Care Management Notes (Signed)
[facility]  Care Management Note    Patient Name: Antonio Gillespie  Date of Birth: November 29, 1960  Sex: male  Date/Time of Admission: 04/28/2018  3:32 PM  Room/Bed: 725/A  Payor: OUTSOURCE PENDING MEDICAID / Plan: ELIGIBILITY PENDING MEDICAID / Product Type: Medicaid /    LOS: 26 days   Primary Care Providers:  Selinda Eon, MD, MD (General)    Admitting Diagnosis:  Scalp hematoma [S00.03XA]    Assessment:      05/24/18 1257   Assessment Details   Assessment Type Other   Date of Care Management Update 05/24/18   Hospice orders written and pended, Dr Cherly Hensen text paged to sign.    Discharge Plan:  SNF with Hospice (code 20)  [place]    The patient will continue to be evaluated for developing discharge needs.     Case Manager: Carita Pian, CLINICAL CARE COORDINATOR  Phone: [phone]

## 2018-05-24 NOTE — Care Plan (Signed)
MSW spoke with HCS, Waldron Session [phone]. MSW updated him regarding DC planning. MSW informed him at this time [facility] is the only accepting facility within a 40 mile radius willing to accept pt. He states he was hoping for some place closer, however, he was agreeable as they are the only facility at this time willing to accept. MSW also informed him they are allowing hospice to go in to provide additional support/care. MSW reviewed choice for hospice (and nursing home) and he chose [facility]. MSW spoke with Charlynne Pander with [facility] [phone]. She states the referral will go to their [place] location. Referral sent via allscripts.  COVID test has been ordered. Waiting for it to be completed and results. MSW will continue to follow.

## 2018-05-24 NOTE — Care Plan (Signed)
Problem: Adult Inpatient Plan of Care  Goal: Plan of Care Review  Outcome: Ongoing (see interventions/notes)  Flowsheets (Taken 05/24/2018 0423)  Plan of Care Reviewed With: patient  Goal: Patient-Specific Goal (Individualized)  Outcome: Ongoing (see interventions/notes)  Flowsheets  Taken 05/23/2018 1955  Individualized Care Needs: trach suctioning  Taken 05/24/2018 0356  Patient-Specific Goals (Include Timeframe): keep pt comfortable  Goal: Absence of Hospital-Acquired Illness or Injury  Outcome: Ongoing (see interventions/notes)  Goal: Optimal Comfort and Wellbeing  Outcome: Ongoing (see interventions/notes)  Goal: Rounds/Family Conference  Outcome: Ongoing (see interventions/notes)     Problem: Fall Injury Risk  Goal: Absence of Fall and Fall-Related Injury  Outcome: Ongoing (see interventions/notes)     Problem: Skin Injury Risk Increased  Goal: Skin Health and Integrity  Outcome: Ongoing (see interventions/notes)     Problem: Acute Rehab Services Goal & Intervention Plan  Goal: Bathing Goal  Description: Stand Alone Therapy Goal  Outcome: Ongoing (see interventions/notes)  Goal: Bed Mobility Goal  Description: Stand Alone Therapy Goal  Outcome: Ongoing (see interventions/notes)  Goal: Caregiver Training Goal  Description: Stand Alone Therapy Goal  Outcome: Ongoing (see interventions/notes)  Goal: Cognition Goal  Description: Stand Alone Therapy Goal  Outcome: Ongoing (see interventions/notes)  Goal: Cognition Goals, SLP  Description: Stand Alone Therapy Goal  Outcome: Ongoing (see interventions/notes)  Goal: Communication Goals, SLP  Description: Stand Alone Therapy Goal  Outcome: Ongoing (see interventions/notes)  Goal: Dysphagia Goals, SLP  Description: Stand Alone Therapy Goal  Outcome: Ongoing (see interventions/notes)  Goal: Eating Self-Feeding Goal  Description: Stand Alone Therapy Goal  Outcome: Ongoing (see interventions/notes)  Goal: Gait Training Goal  Description: Stand Alone Therapy Goal  Outcome:  Ongoing (see interventions/notes)  Goal: Grooming Goal  Description: Stand Alone Therapy Goal  Outcome: Ongoing (see interventions/notes)  Goal: Home Management Goal  Description: Stand Alone Therapy Goal  Outcome: Ongoing (see interventions/notes)  Goal: Interprofessional Goal  Description: Stand Alone Therapy Goal  Outcome: Ongoing (see interventions/notes)  Goal: LB Dressing Goal  Description: Stand Alone Therapy Goal  Outcome: Ongoing (see interventions/notes)  Goal: Occupational Therapy Goals  Description: Stand Alone Therapy Goal  Outcome: Ongoing (see interventions/notes)  Goal: Physical Therapy Goal  Description: Stand Alone Therapy Goal  Outcome: Ongoing (see interventions/notes)  Goal: Range of Motion Goal  Description: Stand Alone Therapy Goal  Outcome: Ongoing (see interventions/notes)  Goal: Strength Goal  Description: Stand Alone Therapy Goal  Outcome: Ongoing (see interventions/notes)  Goal: Toileting Goal  Description: Stand Alone Therapy Goal  Outcome: Ongoing (see interventions/notes)  Goal: Goal Transfer Training  Description: Stand Alone Therapy Goal  Outcome: Ongoing (see interventions/notes)  Goal: UB Dressing Goal  Description: Stand Alone Therapy Goal  Outcome: Ongoing (see interventions/notes)     Problem: Communication Impairment (Artificial Airway)  Goal: Effective Communication  Outcome: Ongoing (see interventions/notes)     Problem: Device-Related Complication Risk (Artificial Airway)  Goal: Optimal Device Function  Outcome: Ongoing (see interventions/notes)     Problem: Skin and Tissue Injury (Artificial Airway)  Goal: Absence of Device-Related Skin or Tissue Injury  Outcome: Ongoing (see interventions/notes)     Problem: End-of-Life Care  Goal: Comfort, Peace and Preserved Dignity  Outcome: Ongoing (see interventions/notes)    Plan of care reviewed with patient. Pt alert at times but unable to speak, refusing speaking valve to be placed. Pain managed with meds per MAR. Assessment per  doc flow. Pt free from falls this shift. Sitter select on and active. Pt required deep suctioning one time during this  shift. Needs met with hourly rounding. Pt resting at this time with call bell in reach. Wheels locked and bed in lowest position. Will continue to monitor.

## 2018-05-25 MED ORDER — METOPROLOL TARTRATE 25 MG TABLET
12.50 mg | ORAL_TABLET | Freq: Two times a day (BID) | ORAL | Status: AC
Start: 2018-05-25 — End: ?

## 2018-05-25 MED ORDER — ATROPINE 1 % EYE DROPS
2.00 [drp] | OPHTHALMIC | Status: AC | PRN
Start: 2018-05-25 — End: ?

## 2018-05-25 MED ORDER — ACETAMINOPHEN 325 MG/10.15 ML ORAL SUSPENSION
650.00 mg | ORAL | Status: AC | PRN
Start: 2018-05-25 — End: ?

## 2018-05-25 MED ORDER — MORPHINE CONCENTRATE 100 MG/5 ML (20 MG/ML) ORAL SOLUTION
2.00 mg | ORAL | 0 refills | Status: AC | PRN
Start: 2018-05-25 — End: 2018-06-01

## 2018-05-25 MED ORDER — GLYCOPYRROLATE 1 MG TABLET
2.00 mg | ORAL_TABLET | Freq: Three times a day (TID) | ORAL | Status: AC
Start: 2018-05-25 — End: ?

## 2018-05-25 MED ORDER — LORAZEPAM 0.5 MG TABLET
0.50 mg | ORAL_TABLET | ORAL | 0 refills | Status: AC | PRN
Start: 2018-05-25 — End: ?

## 2018-05-25 MED ORDER — SCOPOLAMINE 1 MG OVER 3 DAYS TRANSDERMAL PATCH
1.00 | MEDICATED_PATCH | TRANSDERMAL | Status: AC
Start: 2018-05-27 — End: ?

## 2018-05-25 MED ORDER — GUAIFENESIN 100 MG/5 ML ORAL LIQUID
10.00 mL | Freq: Four times a day (QID) | ORAL | Status: AC
Start: 2018-05-25 — End: ?

## 2018-05-25 MED ADMIN — REMOVE NITROGLYCERIN PATCH: GASTROSTOMY | @ 10:00:00

## 2018-05-25 MED ADMIN — sodium chloride 0.9 % (flush) injection syringe: @ 14:00:00

## 2018-05-25 MED ADMIN — sodium chloride 0.9 % (flush) injection syringe: @ 06:00:00

## 2018-05-25 MED ADMIN — DOPAMINE PEDS INFUSION - 400 MG/10 ML (40 MG/ML) VIAL PREP: INTRAVENOUS | @ 12:00:00

## 2018-05-25 NOTE — Care Management Notes (Signed)
Referral Information  ++++++ Placed Provider #1 ++++++  Case Manager: Claiborne Rigg  Provider Type: Nursing Home/SNF  Provider Name: [facility]  Address:  [address]  [place], [place] [address]  Contact: Sable Feil    Phone: [phone] x  Fax:   Fax: [phone]

## 2018-05-25 NOTE — Discharge Summary (Signed)
[facility]  DISCHARGE SUMMARY      PATIENT NAME:  Antonio Gillespie  MRN:  [id number]  DOB:  12/02/1960    ADMISSION DATE:  04/28/2018  DISCHARGE DATE:  05/25/2018    ATTENDING PHYSICIAN: Dr Rubye OaksPalmer   PRIMARY CARE PHYSICIAN: Selinda EonPaul Hartley, MD    ADMISSION DIAGNOSIS: Fall  DISCHARGE DIAGNOSIS:   Active Hospital Problems   (*Primary Problem)    Diagnosis   . *Fall     "found down" and taken to outside hospital     . Comfort measures only status   . Altered mental status, unspecified   . Atrial fibrillation (CMS HCC)   . Failure to wean from mechanical ventilation (CMS HCC)   . Anemia   . COPD (chronic obstructive pulmonary disease) (CMS HCC)     Chronic   . Thrombocytopenia Unspecified   . Hypokalemia   . Alcohol abuse   . Acute respiratory failure (CMS HCC)   . Nasal fracture     Acute vs chronic     . Abrasions of multiple sites   . Scalp hematoma   . Humeral fracture     nonop per ortho     . Left inguinal hernia     S/p reduction     . Anasarca       There are no active non-hospital problems to display for this patient.                                                     DISCHARGE MEDICATIONS:     Current Discharge Medication List      START taking these medications.      Details   acetaminophen 325 mg/10.15 mL Suspension  Commonly known as:  TYLENOL   650 mg, Gastric (NG, OG, PEG, GT), EVERY 4 HOURS PRN  Refills:  0     atropine 1 % Drops  Commonly known as:  ISOPTO ATROPINE   2 Drops, Sublingual, EVERY 1 HOUR PRN  Qty:  1 Bottle  Refills:  0     glycopyrrolate 1 mg Tablet  Commonly known as:  ROBINUL   2 mg, Gastric (NG, OG, PEG, GT), 3 TIMES DAILY  Refills:  0     guaiFENesin 100 mg/5 mL Liquid  Commonly known as:  ROBITUSSIN   10 mL, Gastric (NG, OG, PEG, GT), EVERY 6 HOURS  Refills:  0     LORazepam 0.5 mg Tablet  Commonly known as:  ATIVAN   0.5 mg, Oral, EVERY 4 HOURS PRN  Qty:  30 Tab  Refills:  0     metoprolol tartrate 25 mg Tablet  Commonly known as:  LOPRESSOR   12.5 mg, Gastric (NG, OG,  PEG, GT), 2 TIMES DAILY  Refills:  0     morphine concentrate 100 mg/5 mL (20 mg/mL) Solution  Commonly known as:  ROXANOL   2 mg, Gastric (NG, OG, PEG, GT), EVERY 3 HOURS PRN  Qty:  5 mL  Refills:  0     scopolamine 1 mg over 3 days patch 3 day  Commonly known as:  TRANSDERM SCOP  Start taking on:  May 27, 2018   1 Patch, Transdermal, EVERY 72 HOURS  Refills:  0            DISCHARGE INSTRUCTIONS:  Refer to Hospice - EXTERNAL     DISCHARGE INSTRUCTION - MISC    1. Roxanol 2.5 mg gastric Q4H PRN pain, dyspnea.  2. Tylenol 650 mg gastric Q4H PRN pain, fever.  3. Robinul 200 mcg IV scheduled Q8H with 200 mcg IV Q4H PRN excessive secretions.  4. Ativan 1 mg IV Q4H PRN agitation, restlessness, myoclonic jerking.  5. Scopolamine patch.  6. Atropine 2 drops sublingual Q1H PRN excessive secretions.     SCHEDULE FOLLOW-UP - ORTHOPEDICS - [facility]     Follow-up in: 2 WEEKS    Reason for visit: HOSPITAL DISCHARGE    Follow-up reason: Right proximal humerus fracture    Provider: Brett Albino        REASON FOR HOSPITALIZATION AND HOSPITAL COURSE:  The patient is a 58 y.o. male who presented to [facility] as a direct admission trauma status post Fall on 04/28/2018. Report stated:Antonio Gillespie is a 58 y.o. male with PMH of atrial fibrillation, COPD, EtOH abuse who was transferred from outside facility. Per chart review, family of patient had not heard from him and called the police who found him in his home lying face down and covered in urine/feces. Patient was intubated at outside facility ED. Pan CT scan revealed a comminuted right humeral neck fracture, pulmonary artery enlargement, large left inguinal hernia. CT head showed bilateral scalp hematomas. CT c-spine negative. XR right hand and forearm were negative for fractures.   At outside facility, CPK 526, lactic acid 5.5, urine drug screen negative, UA +, blood cultures positive for gram + cocci in clusters 1/4. Patient was started on vancomycin, zosyn,  ceftriaxone. Patient was transferred to [facility] for further management. .  The patient had positive  loss of consciousness and his GCS was 3T on arrival. On arrival to our facility, patient had intact airway, had equal and clear breath sounds bilaterally and was hemodynamically stable. The patient was complaining of unable to obtain due to being intubated. Labs were performed and treated appropriately.  Physical Exam according to H&P upon arrival to ER:   INITIAL VITALS: Temperature: 36.8 C (98.2 F)  BP (Non-Invasive): 128/76  MAP (Non-Invasive): 92 mmHG  Heart Rate: (!) 126(Simultaneous filing. User may not have seen previous data.)  Respiratory Rate: 18(Simultaneous filing. User may not have seen previous data.)  SpO2: 94 %(Simultaneous filing. User may not have seen previous data.)  EXAM: Temperature: 36.9 C (98.4 F)  Heart Rate: (!) 109  BP (Non-Invasive): (!) 114/92  Respiratory Rate: 14  SpO2: 98 %  GEN:   intubated, chronically ill  HEENT:   Pupils 6mm bilaterally, fixed, large posterior scalp hematoma  NECK:   Trachea midline  PULM:   Diminished breath sounds bilaterally  CARDIAC:   Regular rate and rhythm  CHEST::No abrasions or contusions  ABD:   Abdomen soft, and nondistended  PELVIS: No evidence of injury  GU/RECTAL: large scrotal edema  BACK:  No evidence of injury and no step-off  MS: ulcers noted on bilateral knees and feet  NEURO:  Intubated, following commands  GLASGOW COMA SCALE: Eye opening: 4 spontaneous, Verbal resonse:  T Intubated, Best motor response:  6 obeys commands  VASCULAR:  All pulses palpable and equal bilaterally  INTEGUMENTARY:  Pink, warm, and dry and anasarca    The patient had a CXR and PXR in the trauma bay which were negative  for trauma. A FAST was performed and was negative for fluid in all 4 windows.  The following  additional Imaging was performed:    Radiology:    OSH Films with reads-  CT Brain  Positive large bilateral scalp hematomas  CT Cervical Spine  Negative for  trauma  CT Chest, Abdomen & Pelvis  Positive acute comminuted posteriorly angulated and displaced right humeral neck fracture, scattered scarring or atelectasis of both lungs, pulmonary artery enlargement, hepatic steatosis, large bowel containing left inguinal hernia  CT Face Negative for trauma    Interventions upon arrival in the trauma bay Left inguinal hernia reduction.     The patient was admitted by the Trauma Service to the ICU.    Ortho was consulted for the Humeral fx.   NCCU consulted for encephalopathy.   ENT consulted for nasal bone fxs  Advanced wound care team consulted for lower body wound.   Supportive care consulted for goal clarification and family meetings.     The patient progressed throughout his hospitalization.  Please see the detailed problem list for additional information concerning the patient's hospital course.      3/13: direct admit to SICU. Found down, hx of alcohol abuse. R humeral neck fracture, bilateral scalp hematomas. UTI. L inguinal hernia reduced.  3/14: Intermittently following. CTA intra/extra cranial performed. L inguinal hernia reduced again. NCCU, ENT consulted.   3/15: Increased from Spontaneous mode to ASV  3/16: MRI c-spine ordered, wound care consult, ABI/PVRs obtained  3/17: Weaning parameters obtained, but poor, consent obtained for trach/PEG  3/18: C-collar cleared by MRI. Trach/PEG performed  3/19: Attempting to wean to trach collar, tube feeds advanced to goal  3/20: Wean Precedex, ATC Challenge, MRSA PRC Neg, GC/Chlam test  3/22: Trach collar during daytime, ventilator at night  3/23: Febrile overnight, repeat cultures drawn, Linezolid de-escalated to Ancef  4/6: Febrile overnight, CMO,  Palliative care Consulted.  4/7: Afebrile, lungs wheezy, given albuterol, searching for facility placement  4/8: Condition unchanged, SNF excepted patient pending COVID screening test.  4/9: COVID screening negative.           The patient was determined safe for discharge on  05/25/2018.  Prior to discharge the patient was tolerating a diet, his pain was well controlled on oral pain medications, and he was ambulating and physical therapy felt he was safe for discharge to SNF with Hospice. All questions were answered prior to discharge and the patient agreed to discharge at this time. The patient was instructed to follow up sooner for new or concerning symptoms.     A thorough history of non-opioid medications, non-pharmacologic treatment and substance abuse was taken on this patient prior to discharge.  The patient has the following injuries that require outpatient narcotic treatment   Active Hospital Problems    Diagnosis   . Primary Problem: Fall   . Comfort measures only status   . Altered mental status, unspecified   . Atrial fibrillation (CMS HCC)   . Failure to wean from mechanical ventilation (CMS HCC)   . Anemia   . COPD (chronic obstructive pulmonary disease) (CMS HCC)   . Thrombocytopenia Unspecified   . Hypokalemia   . Alcohol abuse   . Acute respiratory failure (CMS HCC)   . Nasal fracture   . Abrasions of multiple sites   . Scalp hematoma   . Humeral fracture   . Left inguinal hernia   . Anasarca   .  The patient is being given a prescription for one week and will be reassessed by phone or in clinic as needed.  Alternative pain treatments  were discussed with this patient such as acetaminophen, nonsteroidal antiinflammatory medications, muscle relaxers, topical pain medicine, medications for neuropathic pain, physical therapy, exercise, elevation, yoga, massage, relaxation and biofeedback.       CONDITION ON DISCHARGE:  A. Ambulation: OOB as tolerated, every 2 hour turns,   B. Self-care Ability: full care  C. Cognitive Status: GCS 10  D. LACE + Score: LACE+ Score (Read Only): 62  E: Wound Care: local     DISCHARGE DISPOSITION:  Skilled Nursing Unit and Hospice    Copies sent to Care Team       Relationship Specialty Notifications Start End    Selinda Eon, MD PCP - General  EXTERNAL  05/01/18     Phone: [phone] Fax: [phone]         [address] [place] [address]        15 minutes was spent by Alfonso Ramus, APRN,AGACNP-BC independent of attending physician in the discharge process of [place].  This time included the daily visit, review of records, provision of discharge instructions, follow up information and completion of discharge summary.  All questions were answered prior to discharge and patient agreed to discharge at this time. The patient was instructed to follow up sooner for new or concerning symptoms.       Alfonso Ramus, APRN,AGACNP-BC 05/25/2018, 11:47       Late entry for 05/25/18. I saw and examined the patient.  I reviewed the resident's note.  I agree with the findings and plan of care as documented in the resident's note.  Any exceptions/additions are edited/noted.    Cherly Hensen, MD

## 2018-05-25 NOTE — Care Management Notes (Signed)
[facility]  Care Management Note    Patient Name: Antonio Gillespie  Date of Birth: [birth year]  Sex: male  Date/Time of Admission: 04/28/2018  3:32 PM  Room/Bed: 725/A  Payor: OUTSOURCE PENDING MEDICAID / Plan: ELIGIBILITY PENDING MEDICAID / Product Type: Medicaid /    LOS: 27 days   Primary Care Providers:  Selinda Eon, MD, MD (General)    Admitting Diagnosis:  Scalp hematoma [S00.03XA]    Assessment:      05/25/18 1104   Assessment Details   Assessment Type Continued Assessment   Date of Care Management Update 05/25/18   Date of Next DCP Update 05/26/18   Care Management Plan   Discharge Planning Status discharge plan complete   Projected Discharge Date 05/25/18   Discharge plan discussed with: Health Care Surrogate   Discharge Needs Assessment   Discharge Facility/Level of Care Needs Nursing Facility-Primary Residence/Not Skilled (code 1)   Transportation Available ambulance     COVID test negative. MSW spoke with Tresa Endo with [facility] [phone], they will accept pt today. Updated service, BSN, brother [name] and AVS update. Tasked CMA to arrange ambulance transport for 12pm.    Discharge Plan:  Nursing Facility-Primary Residence/Not Skilled (code 1)      The patient will continue to be evaluated for developing discharge needs.     Case Manager: Claiborne Rigg  Phone: [phone]

## 2018-05-25 NOTE — Care Management Notes (Signed)
Referral Information  ++++++ Placed Provider #1 ++++++  Case Manager: Claiborne Rigg  Provider Type: Hospice - Outpatient  Provider Name: [facility] [phone])  Address:  [address]  [place], [place] [address]  Contact: Caren Macadam    Phone: [phone] x  Fax:   Fax: [phone]

## 2018-05-25 NOTE — Nurses Notes (Signed)
Patient discharged to SNF and transported via EMS. ATC sent with pt. AVS in packet. Family aware. Pt resting comfortably at this time. Midline removed.

## 2018-05-25 NOTE — Progress Notes (Signed)
[facility]   Trauma Progress Note         Antonio ScrewsMoore, Gillespie E, 58 y.o. male  Date of Birth:  Jan 22, 1961  Date of Admission:  04/28/2018  Date of service: 05/25/2018  Antonio Gillespie, 58 y.o., male Post trauma day 26 status post fall.    Assessment/Plan:  CMO    58 yo m found down at home in urine/feces found to have R humeral neck fx, large L inguinal hernia, and b/l scalp hematomas    POD 19 s/p trach & peg      Patient accepted by SNF pending COVID screening test, COVID negative.    Plan to discharge to SNF today      ABX:  Levofloxacin PO last dose 4/3, Augmentin last dose 3/28, Cefazolin last dose 3/24, Linezolid last dose 3/23  Diet/fluids: DIET REGULAR diet, Last Bowel Movement: 05/24/18  Pain control:  Roxanol 2.5 mg PO Q4H PRN, tylenol G-tube, Q4H PRN  GI/DVT prophylaxis: Enoxaparin and SCDs/ Venodynes/Impulse boots  Nutrition: DIET REGULAR diet, Last Bowel Movement: 05/24/18  Flatus/last BM:  BM 4/5  Lines:  Cuffed Trach, Gastrostomy tube (left), midline   - Please call or page with questions or concerns      Events over the last 24 hours have included:  -patient accepted by sniff pending COVID results  -COVID results negative        Subjective:    Patient lying comfortably in bed.  Patient was somnolent, but would open eyes to verbal stimuli continued to go back to sleep, without answering questions.  Vital Signs:  Temp (24hrs) Max:36.6 C (97.9 F)      Systolic (24hrs), Avg:127 , Min:116 , Max:138     Diastolic (24hrs), Avg:78, Min:74, Max:82    Temp  Avg: 36.6 C (97.9 F)  Min: 36.6 C (97.9 F)  Max: 36.6 C (97.9 F)  MAP (Non-Invasive)  Avg: 84 mmHG  Min: 84 mmHG  Max: 84 mmHG  Pulse  Avg: 95  Min: 95  Max: 95  Resp  Avg: 18  Min: 18  Max: 18  SpO2  Avg: 97 %  Min: 97 %  Max: 97 %  Temperature: 36.6 C (97.9 F)  Heart Rate: 95  BP (Non-Invasive): 116/74  Respiratory Rate: 18  SpO2: 97 %  Pain Score (Numeric, Faces): 0    Today's Physical Exam:   GEN:  No visual acute distress,  somnolent  HEENT:   Trach in place with trace amount of purulent mucus drainage. Normocephalic; atraumatic. Poor dentition.  Tacky mucous membranes  NECK:  Trach in place with trace amount appearing line mucus drainage.  PULM:   Normal respiratory effort.  Referred upper respiratory sounds, no wheezes rhonchi or rales.  CV:   Regular rate and rhythm.  Trace bilateral lower extremity nonpitting edema  ABD:   Distended, non tender, peg tube in place  MS: Atraumatic.  Distal pulses intact.      NEURO:   A&O to person not to place and time.  Patient able to move feet, in bilateral hands in response to commands.  Integumentary:  Pale, warm, and dry and intact, several black spots on distal left toes without overlying ulceration.      Labs:  No results for input(s): WBC, BANDS, HGB, HCT, SODIUM, POTASSIUM, CHLORIDE, BICARBONATE, BUN, CREATININE, GLUCOSE, ANIONGAP, CALCIUM, MAGNESIUM, PHOSPHORUS, INR in the last 72 hours.    Invalid input(s): PLATELET, PTT, PT  In: 330 [P.O.:100; GT:230]  Out: 375 [Urine:375]  Current Medications:  acetaminophen (TYLENOL) 160 mg per 5 mL liquid - grape flavored, 650 mg, Gastric (NG, OG, PEG, GT), Q4H PRN  atropine (ISOPTO ATROPINE) 1% ophthalmic solution - for SUBLINGUAL use, 2 Drop, Sublingual, Q1H PRN  glycopyrrolate (ROBINUL) 0.2 mg/mL (200 mcg/mL) injection, 200 mcg, Intravenous, Q4H PRN  glycopyrrolate (ROBINUL) 0.2 mg/mL (200 mcg/mL) injection, 200 mcg, Intravenous, Q8H  guaiFENesin (ROBITUSSIN) 100mg  per 78mL oral liquid - for cough (expectorant), 10 mL, Gastric (NG, OG, PEG, GT), Q6H  LORazepam (ATIVAN) 2 mg/mL injection, 1 mg, Intravenous, Q4H PRN  metoprolol tartrate (LOPRESSOR) tablet, 12.5 mg, Gastric (NG, OG, PEG, GT), 2x/day  morphine concentrate (ROXANOL) 10 mg per 0.5 mL oral liquid, 2.6 mg, Gastric (NG, OG, PEG, GT), Q4H PRN  NS flush syringe, 2 mL, Intracatheter, Q8HRS    And  NS flush syringe, 2-6 mL, Intracatheter, Q1 MIN PRN  scopolamine 1 mg over 3 days transdermal  patch, 1 Patch, Transdermal, Q72H          Nutrition Management: DIET REGULAR Last Bowel Movement: 05/24/18  No results for input(s): ALBUMIN, PREALBUMIN in the last 72 hours.    I/O:  I/O last 24 hours:      Intake/Output Summary (Last 24 hours) at 05/25/2018 0528  Last data filed at 05/25/2018 0500  Gross per 24 hour   Intake 330 ml   Output --   Net 330 ml     I/O current shift:    04/08 1900 - 04/09 0659  In: 200 [P.O.:100; GT:100]  Out: -       Prophylaxis:    DVT/PE  Enoxaparin and SCDs/ Venodynes/Impulse boots   GI: Not indicated     Nutrition/Residuals:  DIET REGULAR    Radiology Tests:  N/A   Huel Cote, MD 05/25/2018, 06:55   PGY-1 Emergency Medicine  [place] [facility]  Pager # - [phone]/ [employer] [employer]      This note was partially generated using MModal Fluency Direct system, and there may be some incorrect words, spellings, and punctuation that were not noted in checking the note before saving.    -----      Late entry for 05/25/18. I saw and examined the patient.  I reviewed the resident's note.  I agree with the findings and plan of care as documented in the resident's note.  Any exceptions/additions are edited/noted.    Cherly Hensen, MD

## 2018-05-25 NOTE — Care Plan (Signed)
Patient alert and nonverbal d\t tracheostomy. Comfort measures remain in place. To be d\t to SNF with hospice today. Trach care and suctioning provided. Turned and repositioned for comfort. Will continue to monitor.     Problem: Adult Inpatient Plan of Care  Goal: Plan of Care Review  Outcome: Ongoing (see interventions/notes)  Goal: Patient-Specific Goal (Individualized)  Outcome: Ongoing (see interventions/notes)  Goal: Absence of Hospital-Acquired Illness or Injury  Outcome: Ongoing (see interventions/notes)  Goal: Optimal Comfort and Wellbeing  Outcome: Ongoing (see interventions/notes)  Goal: Rounds/Family Conference  Outcome: Ongoing (see interventions/notes)     Problem: Fall Injury Risk  Goal: Absence of Fall and Fall-Related Injury  Outcome: Ongoing (see interventions/notes)     Problem: Skin Injury Risk Increased  Goal: Skin Health and Integrity  Outcome: Ongoing (see interventions/notes)     Problem: Communication Impairment (Artificial Airway)  Goal: Effective Communication  Outcome: Ongoing (see interventions/notes)     Problem: Device-Related Complication Risk (Artificial Airway)  Goal: Optimal Device Function  Outcome: Ongoing (see interventions/notes)     Problem: Skin and Tissue Injury (Artificial Airway)  Goal: Absence of Device-Related Skin or Tissue Injury  Outcome: Ongoing (see interventions/notes)     Problem: End-of-Life Care  Goal: Comfort, Peace and Preserved Dignity  Outcome: Ongoing (see interventions/notes)     Lorelei Pont, RN  05/25/2018, 02:32

## 2018-05-25 NOTE — Nurses Notes (Signed)
Trach care completed, changed inner cannula. Suctioned pt. ATC 50%. Dressing applied under trach plate.

## 2018-06-09 ENCOUNTER — Encounter (HOSPITAL_BASED_OUTPATIENT_CLINIC_OR_DEPARTMENT_OTHER): Payer: Self-pay | Admitting: ORTHOPEDIC, SPORTS MEDICINE

## 2018-08-14 MED ADMIN — potassium chloride 20 mEq/L in 0.9 % sodium chloride intravenous: INTRAVENOUS | @ 23:00:00 | NDC 00338069104

## 2018-08-14 MED ADMIN — THROMBIN 5,000 UNITS IN NS: TOPICAL | @ 08:00:00 | NDC 43825060641

## 2018-08-14 MED ADMIN — oxyCODONE 5 mg tablet: @ 14:00:00

## 2018-08-15 MED ADMIN — sodium chloride 0.9 % (flush) injection syringe: @ 06:00:00

## 2018-08-15 MED ADMIN — lidocaine 4 %-menthoL 1 % topical patch: TRANSDERMAL | @ 21:00:00

## 2018-08-16 MED ADMIN — nicotine 7 mg/24 hr daily transdermal patch: TRANSDERMAL | @ 08:00:00

## 2018-08-17 MED ADMIN — sodium chloride 0.9 % (flush) injection syringe: @ 14:00:00

## 2018-08-18 MED ADMIN — tiotropium device: @ 12:00:00

## 2018-08-21 MED ADMIN — enoxaparin 40 mg/0.4 mL subcutaneous syringe: @ 12:00:00

## 2018-08-21 MED ADMIN — gabapentin 400 mg capsule: @ 22:00:00

## 2018-10-24 ENCOUNTER — Inpatient Hospital Stay (HOSPITAL_COMMUNITY)
Admission: EM | Admit: 2018-10-24 | Discharge: 2018-10-24 | Disposition: A | Discharge status: Home / Self Care | Payer: No Typology Code available for payment source | Source: Other Acute Inpatient Hospital

## 2018-10-24 DIAGNOSIS — F10929 Alcohol use, unspecified with intoxication, unspecified: Secondary | ICD-10-CM

## 2018-10-26 ENCOUNTER — Inpatient Hospital Stay (HOSPITAL_COMMUNITY)
Admission: EM | Admit: 2018-10-26 | Discharge: 2018-10-26 | Disposition: A | Discharge status: Home / Self Care | Payer: No Typology Code available for payment source | Source: Other Acute Inpatient Hospital

## 2018-10-26 DIAGNOSIS — F919 Conduct disorder, unspecified: Secondary | ICD-10-CM

## 2018-10-26 DIAGNOSIS — S62615A Displaced fracture of proximal phalanx of left ring finger, initial encounter for closed fracture: Secondary | ICD-10-CM

## 2018-10-26 DIAGNOSIS — R4182 Altered mental status, unspecified: Secondary | ICD-10-CM

## 2018-10-26 DIAGNOSIS — X58XXXA Exposure to other specified factors, initial encounter: Secondary | ICD-10-CM

## 2019-06-16 DEATH — deceased
